# Patient Record
Sex: Female | Born: 1945 | Race: White | Hispanic: No | Smoking: Former smoker
Health system: Southern US, Community
[De-identification: ages and names within clinical notes are randomized; demographics above are authoritative.]

## PROBLEM LIST (undated history)

## (undated) DIAGNOSIS — F419 Anxiety disorder, unspecified: Secondary | ICD-10-CM

## (undated) DIAGNOSIS — R413 Other amnesia: Secondary | ICD-10-CM

## (undated) DIAGNOSIS — F1021 Alcohol dependence, in remission: Secondary | ICD-10-CM

## (undated) DIAGNOSIS — D649 Anemia, unspecified: Secondary | ICD-10-CM

## (undated) DIAGNOSIS — F32A Depression, unspecified: Secondary | ICD-10-CM

## (undated) DIAGNOSIS — Z860100 Personal history of colon polyps, unspecified: Secondary | ICD-10-CM

## (undated) DIAGNOSIS — M8580 Other specified disorders of bone density and structure, unspecified site: Secondary | ICD-10-CM

## (undated) DIAGNOSIS — R350 Frequency of micturition: Secondary | ICD-10-CM

## (undated) DIAGNOSIS — R1310 Dysphagia, unspecified: Secondary | ICD-10-CM

## (undated) DIAGNOSIS — Z5189 Encounter for other specified aftercare: Secondary | ICD-10-CM

## (undated) DIAGNOSIS — K219 Gastro-esophageal reflux disease without esophagitis: Secondary | ICD-10-CM

## (undated) DIAGNOSIS — T8859XA Other complications of anesthesia, initial encounter: Secondary | ICD-10-CM

## (undated) DIAGNOSIS — R0602 Shortness of breath: Secondary | ICD-10-CM

## (undated) DIAGNOSIS — I499 Cardiac arrhythmia, unspecified: Secondary | ICD-10-CM

## (undated) DIAGNOSIS — K589 Irritable bowel syndrome without diarrhea: Secondary | ICD-10-CM

## (undated) DIAGNOSIS — Z923 Personal history of irradiation: Secondary | ICD-10-CM

## (undated) DIAGNOSIS — J189 Pneumonia, unspecified organism: Secondary | ICD-10-CM

## (undated) DIAGNOSIS — Z8669 Personal history of other diseases of the nervous system and sense organs: Secondary | ICD-10-CM

## (undated) DIAGNOSIS — Z8719 Personal history of other diseases of the digestive system: Secondary | ICD-10-CM

## (undated) DIAGNOSIS — F329 Major depressive disorder, single episode, unspecified: Secondary | ICD-10-CM

## (undated) DIAGNOSIS — I10 Essential (primary) hypertension: Secondary | ICD-10-CM

## (undated) DIAGNOSIS — G4700 Insomnia, unspecified: Secondary | ICD-10-CM

## (undated) DIAGNOSIS — M2550 Pain in unspecified joint: Secondary | ICD-10-CM

## (undated) DIAGNOSIS — Z86718 Personal history of other venous thrombosis and embolism: Secondary | ICD-10-CM

## (undated) DIAGNOSIS — G3184 Mild cognitive impairment, so stated: Secondary | ICD-10-CM

## (undated) DIAGNOSIS — M1990 Unspecified osteoarthritis, unspecified site: Secondary | ICD-10-CM

## (undated) DIAGNOSIS — D689 Coagulation defect, unspecified: Secondary | ICD-10-CM

## (undated) DIAGNOSIS — R112 Nausea with vomiting, unspecified: Secondary | ICD-10-CM

## (undated) DIAGNOSIS — F1010 Alcohol abuse, uncomplicated: Secondary | ICD-10-CM

## (undated) DIAGNOSIS — Z8781 Personal history of (healed) traumatic fracture: Secondary | ICD-10-CM

## (undated) DIAGNOSIS — K5790 Diverticulosis of intestine, part unspecified, without perforation or abscess without bleeding: Secondary | ICD-10-CM

## (undated) DIAGNOSIS — R5383 Other fatigue: Secondary | ICD-10-CM

## (undated) DIAGNOSIS — C801 Malignant (primary) neoplasm, unspecified: Secondary | ICD-10-CM

## (undated) DIAGNOSIS — Z9889 Other specified postprocedural states: Secondary | ICD-10-CM

## (undated) DIAGNOSIS — Z862 Personal history of diseases of the blood and blood-forming organs and certain disorders involving the immune mechanism: Secondary | ICD-10-CM

## (undated) DIAGNOSIS — E785 Hyperlipidemia, unspecified: Secondary | ICD-10-CM

## (undated) DIAGNOSIS — K529 Noninfective gastroenteritis and colitis, unspecified: Secondary | ICD-10-CM

## (undated) DIAGNOSIS — H269 Unspecified cataract: Secondary | ICD-10-CM

## (undated) DIAGNOSIS — J309 Allergic rhinitis, unspecified: Secondary | ICD-10-CM

## (undated) DIAGNOSIS — T7840XA Allergy, unspecified, initial encounter: Secondary | ICD-10-CM

## (undated) DIAGNOSIS — G43909 Migraine, unspecified, not intractable, without status migrainosus: Secondary | ICD-10-CM

## (undated) DIAGNOSIS — M75101 Unspecified rotator cuff tear or rupture of right shoulder, not specified as traumatic: Secondary | ICD-10-CM

## (undated) DIAGNOSIS — F1910 Other psychoactive substance abuse, uncomplicated: Secondary | ICD-10-CM

## (undated) DIAGNOSIS — T4145XA Adverse effect of unspecified anesthetic, initial encounter: Secondary | ICD-10-CM

## (undated) HISTORY — DX: Other specified disorders of bone density and structure, unspecified site: M85.80

## (undated) HISTORY — DX: Major depressive disorder, single episode, unspecified: F32.9

## (undated) HISTORY — DX: Gastro-esophageal reflux disease without esophagitis: K21.9

## (undated) HISTORY — PX: UPPER GASTROINTESTINAL ENDOSCOPY: SHX188

## (undated) HISTORY — DX: Coagulation defect, unspecified: D68.9

## (undated) HISTORY — DX: Other amnesia: R41.3

## (undated) HISTORY — DX: Anxiety disorder, unspecified: F41.9

## (undated) HISTORY — PX: CATARACT EXTRACTION W/ INTRAOCULAR LENS  IMPLANT, BILATERAL: SHX1307

## (undated) HISTORY — DX: Essential (primary) hypertension: I10

## (undated) HISTORY — DX: Pain in unspecified joint: M25.50

## (undated) HISTORY — DX: Dysphagia, unspecified: R13.10

## (undated) HISTORY — PX: KNEE ARTHROSCOPY W/ MENISCECTOMY: SHX1879

## (undated) HISTORY — PX: COLONOSCOPY: SHX174

## (undated) HISTORY — DX: Shortness of breath: R06.02

## (undated) HISTORY — DX: Noninfective gastroenteritis and colitis, unspecified: K52.9

## (undated) HISTORY — DX: Unspecified cataract: H26.9

## (undated) HISTORY — DX: Malignant (primary) neoplasm, unspecified: C80.1

## (undated) HISTORY — DX: Alcohol abuse, uncomplicated: F10.10

## (undated) HISTORY — DX: Encounter for other specified aftercare: Z51.89

## (undated) HISTORY — PX: KNEE OPEN LATERAL RELEASE: SHX1897

## (undated) HISTORY — DX: Anemia, unspecified: D64.9

## (undated) HISTORY — DX: Irritable bowel syndrome, unspecified: K58.9

## (undated) HISTORY — PX: UPPER GI ENDOSCOPY: SHX6162

## (undated) HISTORY — DX: Personal history of irradiation: Z92.3

## (undated) HISTORY — PX: OTHER SURGICAL HISTORY: SHX169

## (undated) HISTORY — DX: Other psychoactive substance abuse, uncomplicated: F19.10

## (undated) HISTORY — PX: TONSILLECTOMY AND ADENOIDECTOMY: SUR1326

## (undated) HISTORY — DX: Allergy, unspecified, initial encounter: T78.40XA

## (undated) HISTORY — DX: Depression, unspecified: F32.A

## (undated) HISTORY — PX: TOTAL KNEE ARTHROPLASTY: SHX125

## (undated) HISTORY — DX: Other fatigue: R53.83

## (undated) HISTORY — PX: BREAST LUMPECTOMY: SHX2

## (undated) HISTORY — DX: Diverticulosis of intestine, part unspecified, without perforation or abscess without bleeding: K57.90

---

## 1974-02-16 HISTORY — PX: VAGINAL HYSTERECTOMY: SUR661

## 1994-02-16 DIAGNOSIS — Z862 Personal history of diseases of the blood and blood-forming organs and certain disorders involving the immune mechanism: Secondary | ICD-10-CM

## 1994-02-16 HISTORY — DX: Personal history of diseases of the blood and blood-forming organs and certain disorders involving the immune mechanism: Z86.2

## 1997-08-07 ENCOUNTER — Ambulatory Visit: Admission: RE | Admit: 1997-08-07 | Discharge: 1997-08-07 | Payer: Self-pay | Admitting: Family Medicine

## 1997-10-11 ENCOUNTER — Ambulatory Visit (HOSPITAL_COMMUNITY): Admission: RE | Admit: 1997-10-11 | Discharge: 1997-10-11 | Payer: Self-pay | Admitting: Sports Medicine

## 1997-12-10 ENCOUNTER — Emergency Department (HOSPITAL_COMMUNITY): Admission: EM | Admit: 1997-12-10 | Discharge: 1997-12-10 | Payer: Self-pay | Admitting: Emergency Medicine

## 1999-06-04 ENCOUNTER — Encounter: Payer: Self-pay | Admitting: Family Medicine

## 1999-06-04 ENCOUNTER — Ambulatory Visit (HOSPITAL_COMMUNITY): Admission: RE | Admit: 1999-06-04 | Discharge: 1999-06-04 | Payer: Self-pay | Admitting: Family Medicine

## 2004-02-17 HISTORY — PX: REPLACEMENT TOTAL KNEE: SUR1224

## 2005-06-17 ENCOUNTER — Encounter: Payer: Self-pay | Admitting: Sports Medicine

## 2005-08-04 ENCOUNTER — Ambulatory Visit: Payer: Self-pay | Admitting: Family Medicine

## 2005-08-27 ENCOUNTER — Ambulatory Visit: Payer: Self-pay | Admitting: Family Medicine

## 2005-09-02 ENCOUNTER — Ambulatory Visit: Payer: Self-pay | Admitting: Family Medicine

## 2005-09-08 ENCOUNTER — Encounter: Admission: RE | Admit: 2005-09-08 | Discharge: 2005-09-08 | Payer: Self-pay | Admitting: Family Medicine

## 2005-09-29 ENCOUNTER — Encounter: Admission: RE | Admit: 2005-09-29 | Discharge: 2005-09-29 | Payer: Self-pay | Admitting: Orthopedic Surgery

## 2005-10-02 ENCOUNTER — Emergency Department (HOSPITAL_COMMUNITY): Admission: EM | Admit: 2005-10-02 | Discharge: 2005-10-02 | Payer: Self-pay | Admitting: Family Medicine

## 2005-10-09 ENCOUNTER — Ambulatory Visit: Payer: Self-pay | Admitting: Family Medicine

## 2005-10-14 ENCOUNTER — Ambulatory Visit: Payer: Self-pay | Admitting: Cardiology

## 2005-12-03 ENCOUNTER — Ambulatory Visit: Payer: Self-pay | Admitting: Family Medicine

## 2005-12-07 ENCOUNTER — Ambulatory Visit: Payer: Self-pay | Admitting: Family Medicine

## 2006-01-26 ENCOUNTER — Ambulatory Visit: Payer: Self-pay | Admitting: Family Medicine

## 2006-02-16 LAB — HM COLONOSCOPY

## 2006-06-05 ENCOUNTER — Emergency Department (HOSPITAL_COMMUNITY): Admission: EM | Admit: 2006-06-05 | Discharge: 2006-06-05 | Payer: Self-pay | Admitting: Emergency Medicine

## 2006-08-17 ENCOUNTER — Ambulatory Visit: Payer: Self-pay | Admitting: Family Medicine

## 2006-08-18 ENCOUNTER — Ambulatory Visit: Payer: Self-pay | Admitting: Family Medicine

## 2006-08-18 LAB — CONVERTED CEMR LAB
Albumin: 3.8 g/dL (ref 3.5–5.2)
Alkaline Phosphatase: 73 units/L (ref 39–117)
Basophils Absolute: 0.1 10*3/uL (ref 0.0–0.1)
Basophils Relative: 1.1 % — ABNORMAL HIGH (ref 0.0–1.0)
CO2: 31 meq/L (ref 19–32)
Calcium: 9.2 mg/dL (ref 8.4–10.5)
Chloride: 110 meq/L (ref 96–112)
Eosinophils Relative: 3.2 % (ref 0.0–5.0)
GFR calc Af Amer: 161 mL/min
GFR calc non Af Amer: 133 mL/min
Glucose, Bld: 90 mg/dL (ref 70–99)
HCT: 36.9 % (ref 36.0–46.0)
Hemoglobin: 12.6 g/dL (ref 12.0–15.0)
Lymphocytes Relative: 30.8 % (ref 12.0–46.0)
MCHC: 34 g/dL (ref 30.0–36.0)
Neutro Abs: 2.6 10*3/uL (ref 1.4–7.7)
Potassium: 4 meq/L (ref 3.5–5.1)
RDW: 12.2 % (ref 11.5–14.6)
Sodium: 144 meq/L (ref 135–145)
Total Protein: 6.8 g/dL (ref 6.0–8.3)

## 2006-08-24 ENCOUNTER — Ambulatory Visit: Payer: Self-pay | Admitting: Family Medicine

## 2006-09-22 ENCOUNTER — Encounter: Payer: Self-pay | Admitting: *Deleted

## 2006-09-29 ENCOUNTER — Encounter: Admission: RE | Admit: 2006-09-29 | Discharge: 2006-09-29 | Payer: Self-pay | Admitting: Family Medicine

## 2006-10-07 ENCOUNTER — Ambulatory Visit: Payer: Self-pay | Admitting: Internal Medicine

## 2006-10-19 ENCOUNTER — Ambulatory Visit: Payer: Self-pay | Admitting: Internal Medicine

## 2006-10-19 ENCOUNTER — Encounter: Payer: Self-pay | Admitting: Family Medicine

## 2006-11-11 DIAGNOSIS — G43909 Migraine, unspecified, not intractable, without status migrainosus: Secondary | ICD-10-CM

## 2006-11-11 DIAGNOSIS — E785 Hyperlipidemia, unspecified: Secondary | ICD-10-CM | POA: Insufficient documentation

## 2006-11-11 DIAGNOSIS — M1990 Unspecified osteoarthritis, unspecified site: Secondary | ICD-10-CM | POA: Insufficient documentation

## 2006-11-17 ENCOUNTER — Ambulatory Visit: Payer: Self-pay | Admitting: Internal Medicine

## 2006-11-17 DIAGNOSIS — J040 Acute laryngitis: Secondary | ICD-10-CM | POA: Insufficient documentation

## 2006-12-10 ENCOUNTER — Telehealth: Payer: Self-pay | Admitting: Family Medicine

## 2007-01-04 ENCOUNTER — Telehealth: Payer: Self-pay | Admitting: Family Medicine

## 2007-01-05 ENCOUNTER — Telehealth: Payer: Self-pay | Admitting: Family Medicine

## 2007-02-11 ENCOUNTER — Ambulatory Visit: Payer: Self-pay | Admitting: Internal Medicine

## 2007-03-18 ENCOUNTER — Telehealth: Payer: Self-pay | Admitting: Family Medicine

## 2007-03-22 ENCOUNTER — Ambulatory Visit: Payer: Self-pay | Admitting: Family Medicine

## 2007-05-30 ENCOUNTER — Ambulatory Visit: Payer: Self-pay | Admitting: Family Medicine

## 2007-07-05 ENCOUNTER — Ambulatory Visit (HOSPITAL_COMMUNITY): Admission: RE | Admit: 2007-07-05 | Discharge: 2007-07-05 | Payer: Self-pay | Admitting: Orthopedic Surgery

## 2007-07-13 ENCOUNTER — Ambulatory Visit (HOSPITAL_COMMUNITY): Admission: RE | Admit: 2007-07-13 | Discharge: 2007-07-14 | Payer: Self-pay | Admitting: Orthopedic Surgery

## 2007-08-11 ENCOUNTER — Telehealth: Payer: Self-pay | Admitting: *Deleted

## 2007-08-23 ENCOUNTER — Ambulatory Visit: Payer: Self-pay | Admitting: Family Medicine

## 2007-08-23 LAB — CONVERTED CEMR LAB
AST: 18 units/L (ref 0–37)
Alkaline Phosphatase: 72 units/L (ref 39–117)
BUN: 3 mg/dL — ABNORMAL LOW (ref 6–23)
Basophils Absolute: 0 10*3/uL (ref 0.0–0.1)
Basophils Relative: 0.7 % (ref 0.0–1.0)
Bilirubin, Direct: 0.1 mg/dL (ref 0.0–0.3)
CO2: 27 meq/L (ref 19–32)
Eosinophils Absolute: 0.2 10*3/uL (ref 0.0–0.7)
Eosinophils Relative: 3.5 % (ref 0.0–5.0)
GFR calc Af Amer: 161 mL/min
Glucose, Bld: 93 mg/dL (ref 70–99)
Glucose, Urine, Semiquant: NEGATIVE
Lymphocytes Relative: 23.5 % (ref 12.0–46.0)
Monocytes Relative: 7.2 % (ref 3.0–12.0)
Neutro Abs: 3.5 10*3/uL (ref 1.4–7.7)
RDW: 12 % (ref 11.5–14.6)
Specific Gravity, Urine: 1.02
TSH: 0.86 microintl units/mL (ref 0.35–5.50)
Total Bilirubin: 0.7 mg/dL (ref 0.3–1.2)
Total Protein: 7.1 g/dL (ref 6.0–8.3)
WBC Urine, dipstick: NEGATIVE
WBC: 5.4 10*3/uL (ref 4.5–10.5)

## 2007-08-30 ENCOUNTER — Ambulatory Visit: Payer: Self-pay | Admitting: Family Medicine

## 2007-08-30 DIAGNOSIS — J309 Allergic rhinitis, unspecified: Secondary | ICD-10-CM

## 2007-09-08 ENCOUNTER — Encounter: Admission: RE | Admit: 2007-09-08 | Discharge: 2007-10-18 | Payer: Self-pay | Admitting: Orthopedic Surgery

## 2007-11-11 ENCOUNTER — Telehealth: Payer: Self-pay | Admitting: *Deleted

## 2007-11-18 ENCOUNTER — Encounter: Admission: RE | Admit: 2007-11-18 | Discharge: 2007-11-18 | Payer: Self-pay | Admitting: Family Medicine

## 2007-12-22 ENCOUNTER — Telehealth: Payer: Self-pay | Admitting: Family Medicine

## 2008-02-20 ENCOUNTER — Telehealth: Payer: Self-pay | Admitting: Family Medicine

## 2008-02-21 ENCOUNTER — Telehealth: Payer: Self-pay | Admitting: *Deleted

## 2008-02-23 ENCOUNTER — Telehealth: Payer: Self-pay | Admitting: Family Medicine

## 2008-03-06 ENCOUNTER — Telehealth: Payer: Self-pay | Admitting: Family Medicine

## 2008-03-30 ENCOUNTER — Telehealth: Payer: Self-pay | Admitting: Family Medicine

## 2008-05-07 ENCOUNTER — Telehealth: Payer: Self-pay | Admitting: Family Medicine

## 2008-06-22 DIAGNOSIS — G5600 Carpal tunnel syndrome, unspecified upper limb: Secondary | ICD-10-CM

## 2008-06-23 ENCOUNTER — Ambulatory Visit: Payer: Self-pay | Admitting: Internal Medicine

## 2008-06-23 LAB — CONVERTED CEMR LAB: Rapid Strep: NEGATIVE

## 2008-06-25 ENCOUNTER — Telehealth: Payer: Self-pay | Admitting: Family Medicine

## 2008-06-28 ENCOUNTER — Telehealth: Payer: Self-pay | Admitting: Family Medicine

## 2008-06-29 ENCOUNTER — Ambulatory Visit: Payer: Self-pay | Admitting: Family Medicine

## 2008-06-29 LAB — CONVERTED CEMR LAB
ALT: 25 units/L (ref 0–35)
BUN: 12 mg/dL (ref 6–23)
Basophils Absolute: 0 10*3/uL (ref 0.0–0.1)
Basophils Relative: 1.2 % (ref 0.0–3.0)
Eosinophils Absolute: 0.1 10*3/uL (ref 0.0–0.7)
GFR calc non Af Amer: 107.04 mL/min (ref >60)
HCT: 35.7 % — ABNORMAL LOW (ref 36.0–46.0)
Hemoglobin: 12.4 g/dL (ref 12.0–15.0)
Lymphocytes Relative: 31.6 % (ref 12.0–46.0)
MCHC: 34.7 g/dL (ref 30.0–36.0)
Monocytes Relative: 10.6 % (ref 3.0–12.0)
Neutrophils Relative %: 53.6 % (ref 43.0–77.0)
Platelets: 280 10*3/uL (ref 150.0–400.0)
Potassium: 4 meq/L (ref 3.5–5.1)
Total Bilirubin: 0.8 mg/dL (ref 0.3–1.2)
Total Protein: 6.8 g/dL (ref 6.0–8.3)

## 2008-08-08 ENCOUNTER — Telehealth: Payer: Self-pay | Admitting: *Deleted

## 2008-08-13 ENCOUNTER — Emergency Department (HOSPITAL_COMMUNITY): Admission: EM | Admit: 2008-08-13 | Discharge: 2008-08-13 | Payer: Self-pay | Admitting: Family Medicine

## 2008-08-31 ENCOUNTER — Ambulatory Visit: Payer: Self-pay | Admitting: Family Medicine

## 2008-08-31 LAB — CONVERTED CEMR LAB
ALT: 32 units/L (ref 0–35)
BUN: 12 mg/dL (ref 6–23)
Basophils Absolute: 0 10*3/uL (ref 0.0–0.1)
Chloride: 107 meq/L (ref 96–112)
Cholesterol: 167 mg/dL (ref 0–200)
Creatinine, Ser: 0.5 mg/dL (ref 0.4–1.2)
Glucose, Bld: 88 mg/dL (ref 70–99)
HDL: 55 mg/dL (ref >39.00)
Ketones, urine, test strip: NEGATIVE
Lymphs Abs: 1.2 10*3/uL (ref 0.7–4.0)
MCHC: 34.8 g/dL (ref 30.0–36.0)
Monocytes Relative: 8.7 % (ref 3.0–12.0)
Neutro Abs: 1.7 10*3/uL (ref 1.4–7.7)
Neutrophils Relative %: 50.3 % (ref 43.0–77.0)
Nitrite: NEGATIVE
RBC: 3.77 M/uL — ABNORMAL LOW (ref 3.87–5.11)
RDW: 12.1 % (ref 11.5–14.6)
Sodium: 142 meq/L (ref 135–145)
Specific Gravity, Urine: 1.015
TSH: 1.34 microintl units/mL (ref 0.35–5.50)
Total CHOL/HDL Ratio: 3
Total Protein: 7.1 g/dL (ref 6.0–8.3)

## 2008-09-17 ENCOUNTER — Ambulatory Visit: Payer: Self-pay | Admitting: Family Medicine

## 2008-10-16 ENCOUNTER — Ambulatory Visit: Payer: Self-pay | Admitting: Family Medicine

## 2008-10-16 ENCOUNTER — Encounter: Payer: Self-pay | Admitting: Family Medicine

## 2008-10-17 DIAGNOSIS — L820 Inflamed seborrheic keratosis: Secondary | ICD-10-CM

## 2008-10-29 ENCOUNTER — Ambulatory Visit: Payer: Self-pay | Admitting: Family Medicine

## 2008-10-29 LAB — CONVERTED CEMR LAB
Bilirubin Urine: NEGATIVE
Glucose, Urine, Semiquant: NEGATIVE
Nitrite: NEGATIVE
Protein, U semiquant: NEGATIVE

## 2008-11-28 ENCOUNTER — Ambulatory Visit (HOSPITAL_BASED_OUTPATIENT_CLINIC_OR_DEPARTMENT_OTHER): Admission: RE | Admit: 2008-11-28 | Discharge: 2008-11-28 | Payer: Self-pay | Admitting: Orthopedic Surgery

## 2009-01-08 ENCOUNTER — Encounter: Admission: RE | Admit: 2009-01-08 | Discharge: 2009-01-08 | Payer: Self-pay | Admitting: Family Medicine

## 2009-01-17 ENCOUNTER — Encounter: Admission: RE | Admit: 2009-01-17 | Discharge: 2009-02-14 | Payer: Self-pay | Admitting: Orthopedic Surgery

## 2009-01-28 ENCOUNTER — Encounter: Admission: RE | Admit: 2009-01-28 | Discharge: 2009-01-30 | Payer: Self-pay | Admitting: Orthopedic Surgery

## 2009-02-05 ENCOUNTER — Telehealth: Payer: Self-pay | Admitting: Family Medicine

## 2009-02-07 ENCOUNTER — Telehealth: Payer: Self-pay | Admitting: Family Medicine

## 2009-02-16 HISTORY — PX: OTHER SURGICAL HISTORY: SHX169

## 2009-02-18 ENCOUNTER — Encounter: Admission: RE | Admit: 2009-02-18 | Discharge: 2009-05-19 | Payer: Self-pay | Admitting: Orthopedic Surgery

## 2009-03-28 ENCOUNTER — Telehealth: Payer: Self-pay | Admitting: Family Medicine

## 2009-04-03 ENCOUNTER — Telehealth: Payer: Self-pay | Admitting: Family Medicine

## 2009-06-03 ENCOUNTER — Telehealth: Payer: Self-pay | Admitting: Family Medicine

## 2009-06-12 ENCOUNTER — Telehealth: Payer: Self-pay | Admitting: Family Medicine

## 2009-08-02 ENCOUNTER — Telehealth: Payer: Self-pay | Admitting: Family Medicine

## 2009-10-08 ENCOUNTER — Ambulatory Visit: Payer: Self-pay | Admitting: Family Medicine

## 2009-10-08 LAB — CONVERTED CEMR LAB
Albumin: 4.4 g/dL (ref 3.5–5.2)
Bilirubin, Direct: 0.1 mg/dL (ref 0.0–0.3)
CO2: 29 meq/L (ref 19–32)
Chloride: 105 meq/L (ref 96–112)
Cholesterol: 268 mg/dL — ABNORMAL HIGH (ref 0–200)
Direct LDL: 187.9 mg/dL
Eosinophils Absolute: 0.1 10*3/uL (ref 0.0–0.7)
Glucose, Bld: 94 mg/dL (ref 70–99)
Hemoglobin: 13.8 g/dL (ref 12.0–15.0)
MCHC: 33.7 g/dL (ref 30.0–36.0)
MCV: 99.7 fL (ref 78.0–100.0)
Monocytes Absolute: 0.3 10*3/uL (ref 0.1–1.0)
Platelets: 286 10*3/uL (ref 150.0–400.0)
Potassium: 4 meq/L (ref 3.5–5.1)
RDW: 12.6 % (ref 11.5–14.6)
TSH: 0.99 microintl units/mL (ref 0.35–5.50)
Total CHOL/HDL Ratio: 4
Triglycerides: 85 mg/dL (ref 0.0–149.0)

## 2009-10-15 ENCOUNTER — Ambulatory Visit: Payer: Self-pay | Admitting: Family Medicine

## 2009-10-15 DIAGNOSIS — I10 Essential (primary) hypertension: Secondary | ICD-10-CM

## 2009-10-29 ENCOUNTER — Ambulatory Visit: Payer: Self-pay | Admitting: Family Medicine

## 2009-11-08 ENCOUNTER — Telehealth: Payer: Self-pay | Admitting: Family Medicine

## 2009-11-20 ENCOUNTER — Ambulatory Visit: Payer: Self-pay | Admitting: Family Medicine

## 2010-01-24 ENCOUNTER — Telehealth: Payer: Self-pay | Admitting: Family Medicine

## 2010-03-17 ENCOUNTER — Ambulatory Visit
Admission: RE | Admit: 2010-03-17 | Discharge: 2010-03-17 | Payer: Self-pay | Source: Home / Self Care | Attending: Family Medicine | Admitting: Family Medicine

## 2010-03-18 NOTE — Progress Notes (Signed)
Summary: new rx  Phone Note Call from Patient Call back at Home Phone [phone]   Caller: Patient Call For: Tonya Pee MD Summary of Call: pt needs new rx ritalin 10 mg call when ready for pick up. Initial call taken by: Heron Sabins,  June 03, 2009 8:41 AM  Follow-up for Phone Call        rx ready to pick up Follow-up by: Kern Reap CMA Duncan Dull),  June 03, 2009 9:57 AM    Prescriptions: RITALIN 10 MG  TABS (METHYLPHENIDATE HCL) 2AM & 2 AFTERNOON fill in one month  #120 x 0   Entered by:   Kern Reap CMA (AAMA)   Authorized by:   Tonya Pee MD   Signed by:   Kern Reap CMA (AAMA) on 06/03/2009   Method used:   Print then Give to Patient   RxID:   [id number] RITALIN 10 MG  TABS (METHYLPHENIDATE HCL) 2AM & 2 AFTERNOON fill in two months  #120 x 0   Entered by:   Kern Reap CMA (AAMA)   Authorized by:   Tonya Pee MD   Signed by:   Kern Reap CMA (AAMA) on 06/03/2009   Method used:   Print then Give to Patient   RxID:   [id number] RITALIN 10 MG  TABS (METHYLPHENIDATE HCL) 2AM AND 2 AFTERNOON  #120 x 0   Entered by:   Kern Reap CMA (AAMA)   Authorized by:   Tonya Pee MD   Signed by:   Kern Reap CMA (AAMA) on 06/03/2009   Method used:   Print then Give to Patient   RxID:   [id number]

## 2010-03-18 NOTE — Assessment & Plan Note (Signed)
Summary: cpx/cjr   Vital Signs:  Patient profile:   65 year old female Menstrual status:  hysterectomy Height:      66 inches Weight:      204 pounds BMI:     33.05 Temp:     98.1 degrees F oral BP sitting:   172 / 100  (left arm) Cuff size:   large  Vitals Entered By: Kern Reap CMA Duncan Dull) (October 15, 2009 10:50 AM) Flu Vaccine Consent Questions     Do you have a history of severe allergic reactions to this vaccine? no    Any prior history of allergic reactions to egg and/or gelatin? no    Do you have a sensitivity to the preservative Thimersol? no    Do you have a past history of Guillan-Barre Syndrome? no    Do you currently have an acute febrile illness? no    Have you ever had a severe reaction to latex? no    Vaccine information given and explained to patient? yes    Are you currently pregnant? 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  She has a history of underlying ADD for which he takes Ritalin 20 mg in the morning and 20 mg in the afternoon.  She also takes Vicodin ES, one tablet b.i.d. for chronic back and neck pain.  Now her right shoulder and hands are bothering her a lot.  Recommend she go back and see her orthopedist.  She takes an 81-mg baby aspirin daily and uses MetroGel and a steroid lotion for rosacea.  She stopped her Lexapro, feels emotionally well.  She also takes Imitrex p.r.n. for migraine headaches.  Tetanus fissure 2008 seasonal flu shot and Pneumovax today.  Here for Medicare AWV:  1.   Risk factors based on Past M, S, F history:..reviewed in detail.  No change, except she's now self-employed. 2.   Physical Activities: ..does not exercise on a regular basis 3.   Depression/mood: mood good.  No  depression 4.   Hearing: hearing normal 5.   ADL's: works 35 hours per week.  Functions normally 6.   Fall Risk:  at home 7.   Home Safety: reviewed.  No guns in the house 8.   Height, weight, &visual acuity:height weight, normal.  Visual acuity normal 9.   Counseling: start an exercise program and weight loss program 10.   Labs ordered based on risk factors: hyperlipidemia on lab work 11.           Referral Coordinationnone indicated 12.           Care Plan..start Zestoretic 20 to 25 daily for hypertension.  Daily blood pressure checks at home.  Follow-up in two weeks.Marland Kitchenalso Zocor 20 mg nightly for hyperlipidemia 13.            Cognitive Assessment .Marland Kitchen...normal.......Marland Kitchen functions normally writes her own checks  Allergies: 1)  ! Penicillin G Sodium (Penicillin G Sodium) 2)  ! * Nickel 3)  ! Nsaids  Past History:  Past medical, surgical, family and social histories (including risk factors) reviewed, and no changes noted (except as noted below).  Past Medical History: Reviewed history from 08/30/2007 and no changes required. Osteoarthritis migraine learning disability Hyperlipidemia psoratic arthritis  bilateral total knee replacements Allergic rhinitis hysterectomy ovaries left intact number x 3 tonsillectomy  Past Surgical History: Reviewed history from 11/11/2006 and no changes required. Hysterectomy  Family History: Reviewed history from 08/30/2007 and no changes required. father died at 3, coronary disease, hypertension, smoker mother died 50.  No known coronary disease, coronary bypass obesity  one brother, skin cancertwo sisters have arthritis  Social History: Reviewed history from 06/23/2008 and no changes required. Occupation: Widow/Widower Former Smoker Alcohol use-no Drug use-no brother dies this week and just had funeral  Review of Systems      See HPI  Physical Exam  General:  Well-developed,well-nourished,in no acute distress; alert,appropriate and  cooperative throughout examination Head:  Normocephalic and atraumatic without obvious abnormalities. No apparent alopecia or balding. Eyes:  No corneal or conjunctival inflammation noted. EOMI. Perrla. Funduscopic exam benign, without hemorrhages, exudates or papilledema. Vision grossly normal. Ears:  External ear exam shows no significant lesions or deformities.  Otoscopic examination reveals clear canals, tympanic membranes are intact bilaterally without bulging, retraction, inflammation or discharge. Hearing is grossly normal bilaterally. Nose:  External nasal examination shows no deformity or inflammation. Nasal mucosa are pink and moist without lesions or exudates. Mouth:  Oral mucosa and oropharynx without lesions or exudates.  Teeth in good repair. Neck:  No deformities, masses, or tenderness noted. Chest Wall:  No deformities, masses, or tenderness noted. Breasts:  No mass, nodules, thickening, tenderness, bulging, retraction, inflamation, nipple discharge or skin changes noted.   Lungs:  Normal respiratory effort, chest expands symmetrically. Lungs are clear to auscultation, no crackles or wheezes. Heart:  Normal rate and regular rhythm. S1 and S2 normal without gallop, murmur, click, rub or other extra sounds. Abdomen:  Bowel sounds positive,abdomen soft and non-tender without masses, organomegaly or hernias noted. Rectal:  No external abnormalities noted. Normal sphincter tone. No rectal masses or tenderness. Genitalia:  Pelvic Exam:        External: normal female genitalia without lesions or masses        Vagina: normal without lesions or masses        Cervix: normal without lesions or masses        Adnexa: normal bimanual exam without masses or fullness        Uterus: normal by palpation        Pap smear: not performed Msk:  No deformity or scoliosis noted of thoracic or lumbar spine.   Pulses:  R and L carotid,radial,femoral,dorsalis pedis and posterior tibial pulses are full and  equal bilaterally Extremities:  No clubbing, cyanosis, edema, or deformity noted with normal full range of motion of all joints.   Neurologic:  No cranial nerve deficits noted. Station and gait are normal. Plantar reflexes are down-going bilaterally. DTRs are symmetrical throughout. Sensory, motor and coordinative functions appear intact. Skin:  total body skin exam normal except for scars, right left knee from previous knee replacement.  Also, right shoulder from previous over surgery Cervical Nodes:  No lymphadenopathy noted Axillary Nodes:  No palpable lymphadenopathy Inguinal Nodes:  No significant adenopathy Psych:  Cognition and judgment appear intact. Alert and cooperative with normal attention span and concentration. No apparent delusions, illusions, hallucinations   Problems:  Medical Problems Added: 1)  Dx of Hypertension  (ICD-401.9)  Impression & Recommendations:  Problem # 1:  PHYSICAL EXAMINATION (ICD-V70.0) Assessment Unchanged  Orders: Medicare -1st Annual Wellness Visit [phone])  Problem # 2:  MIGRAINE HEADACHE (ICD-346.90) Assessment: Unchanged  Her updated medication list for this problem  includes:    Baby Aspirin 81 Mg Chew (Aspirin) .Marland Kitchen... 2 once daily    Vicodin Es 7.5-750 Mg Tabs (Hydrocodone-acetaminophen) .Marland Kitchen... Take 1 tablet by mouth two times a day prn    Sumatriptan Succinate 100 Mg Tabs (Sumatriptan succinate) .Marland Kitchen... Take one tablet once daily prn  Orders: Medicare -1st Annual Wellness Visit [phone])  Problem # 3:  HYPERLIPIDEMIA (ICD-272.4) Assessment: Deteriorated  The following medications were removed from the medication list:    Zocor 80 Mg Tabs (Simvastatin) .Marland Kitchen... Take 1 tablet by mouth at bedtime Her updated medication list for this problem includes:    Zocor 20 Mg Tabs (Simvastatin) .Marland Kitchen... 1 tab @ bedtime  Problem # 4:  OSTEOARTHRITIS (ICD-715.90) Assessment: Unchanged  Her updated medication list for this problem includes:    Baby Aspirin  81 Mg Chew (Aspirin) .Marland Kitchen... 2 once daily    Vicodin Es 7.5-750 Mg Tabs (Hydrocodone-acetaminophen) .Marland Kitchen... Take 1 tablet by mouth two times a day prn  Orders: Medicare -1st Annual Wellness Visit [phone])  Problem # 5:  HYPERTENSION (ICD-401.9) Assessment: New  Her updated medication list for this problem includes:    Zestoretic 10-12.5 Mg Tabs (Lisinopril-hydrochlorothiazide) .Marland Kitchen... Take 1 tablet by mouth every morning  Orders: EKG w/ Interpretation (93000)  Complete Medication List: 1)  Ritalin 10 Mg Tabs (Methylphenidate hcl) .... 2am and 2 afternoon 2)  Baby Aspirin 81 Mg Chew (Aspirin) .... 2 once daily 3)  Betamethasone Dipropionate 0.05 % Crea (Betamethasone dipropionate) .... As needed 4)  Ritalin 10 Mg Tabs (Methylphenidate hcl) .... 2am & 2 afternoon fill in one month 5)  Ritalin 10 Mg Tabs (Methylphenidate hcl) .... 2am & 2 afternoon fill in two months 6)  Vicodin Es 7.5-750 Mg Tabs (Hydrocodone-acetaminophen) .... Take 1 tablet by mouth two times a day prn 7)  Metrogel 1 % Gel (Metronidazole) .... Apply at bedtime 8)  Del-beta 0.05 % Lotn (Betamethasone dipropionate) .... Apply daily 9)  Sumatriptan Succinate 100 Mg Tabs (Sumatriptan succinate) .... Take one tablet once daily prn 10)  Zestoretic 10-12.5 Mg Tabs (Lisinopril-hydrochlorothiazide) .... Take 1 tablet by mouth every morning 11)  Zocor 20 Mg Tabs (Simvastatin) .Marland Kitchen.. 1 tab @ bedtime  Other Orders: Admin 1st Vaccine (09811) Flu Vaccine 53yrs + (91478) Pneumococcal Vaccine (29562) Admin of Any Addtl Vaccine (13086)  Patient Instructions: 1)  begin Zestoretic one tablet daily in the morning.  Check her blood pressure daily in the morning.  Return in two weeks for follow-up with the data and the device. 2)  Begin Zocor 20 mg nightly for hyperlipidemia. 3)  Continue your other medications 4)  Please schedule a follow-up appointment in 1 year. 5)  Please schedule a follow-up appointment as needed. 6)  It is important  that you exercise regularly at least 20 minutes 5 times a week. If you develop chest pain, have severe difficulty breathing, or feel very tired , stop exercising immediately and seek medical attention. 7)  You need to lose weight. Consider a lower calorie diet and regular exercise.  8)  Schedule your mammogram. 9)  Schedule a colonoscopy/sigmoidoscopy to help detect colon cancer. 10)  Take calcium +Vitamin D daily. 11)  Take an Aspirin every day. Prescriptions: SUMATRIPTAN SUCCINATE 100 MG TABS (SUMATRIPTAN SUCCINATE) take one tablet once daily prn  #9 x 11   Entered and Authorized by:   Roderick Pee MD   Signed by:   Roderick Pee MD on 10/15/2009   Method used:   Printed then faxed to .Marland KitchenMarland Kitchen  [facility]* (retail)       [address]       [place], [place]  [address]       Ph: [phone] or [phone]       Fax: [phone]   RxID:   [id number] DEL-BETA 0.05 %  LOTN (BETAMETHASONE DIPROPIONATE) apply daily  #60 ml x 3 x 4   Entered and Authorized by:   Roderick Pee MD   Signed by:   Roderick Pee MD on 10/15/2009   Method used:   Printed then faxed to ...       [facility]* (retail)       [address]       [place], [place]  [address]       Ph: [phone] or [phone]       Fax: [phone]   RxID:   [id number] METROGEL 1 %  GEL (METRONIDAZOLE) apply at bedtime  #30 gr x 8   Entered and Authorized by:   Roderick Pee MD   Signed by:   Roderick Pee MD on 10/15/2009   Method used:   Printed then faxed to ...       [facility] (retail)       [address]       [place], [place]  [address]       Ph: [phone] or [phone]       Fax: [phone]   RxID:   [id number] VICODIN ES 7.5-750 MG  TABS (HYDROCODONE-ACETAMINOPHEN) Take 1 tablet by mouth two times a day prn  #60 x 11   Entered and Authorized by:   Roderick Pee MD   Signed by:   Roderick Pee MD on 10/15/2009   Method used:   Printed then faxed to ...       [facility] (retail)       [address]       [place], [place]  [address]       Ph: [phone] or [phone]       Fax: [phone]   RxID:   [id number] RITALIN 10 MG  TABS (METHYLPHENIDATE HCL) 2AM & 2 AFTERNOON fill in two months  #120 x 0   Entered and Authorized by:   Roderick Pee MD   Signed by:   Roderick Pee MD on 10/15/2009   Method used:   Printed then faxed to ...       [facility] (retail)       [address]       [place], [place]  [address]       Ph: [phone] or [phone]       Fax: [phone]   RxID:   [id number] RITALIN 10 MG  TABS (METHYLPHENIDATE HCL) 2AM & 2 AFTERNOON fill in one month  #120 x 0   Entered and Authorized by:   Roderick Pee MD   Signed by:   Roderick Pee MD on 10/15/2009   Method used:   Printed then faxed to ...       [facility] (retail)       [address]       [place], [place]  [address]       Ph: [phone] or [phone]       Fax: [phone]   RxID:   [id number] RITALIN 10 MG  TABS (METHYLPHENIDATE HCL) 2AM AND 2 AFTERNOON  #120 x 0  Entered and Authorized by:   Roderick Pee MD   Signed by:   Roderick Pee MD on 10/15/2009   Method used:   Printed then faxed to ...       [facility] (retail)       [address]       [place], [place]  [address]       Ph: [phone] or [phone]       Fax: [phone]   RxID:   [id number] BETAMETHASONE DIPROPIONATE 0.05 %  CREA (BETAMETHASONE DIPROPIONATE) as needed  #60 gr. x 3   Entered and Authorized by:   Roderick Pee MD   Signed by:   Roderick Pee MD on 10/15/2009   Method used:   Printed then faxed to ...       [facility] (retail)       [address]       [place], [place]  [address]       Ph: [phone] or [phone]       Fax: [phone]   RxID:   [id number] ZOCOR 20 MG TABS (SIMVASTATIN) 1 tab @ bedtime  #100 x 3   Entered and Authorized by:   Roderick Pee MD   Signed by:   Roderick Pee MD on 10/15/2009   Method  used:   Printed then faxed to ...       [facility] (retail)       [address]       [place], [place]  [address]       Ph: [phone] or [phone]       Fax: [phone]   RxID:   [id number] ZESTORETIC 10-12.5 MG TABS (LISINOPRIL-HYDROCHLOROTHIAZIDE) Take 1 tablet by mouth every morning  #100 x 3   Entered and Authorized by:   Roderick Pee MD   Signed by:   Roderick Pee MD on 10/15/2009   Method used:   Printed then faxed to ...       [facility] (retail)       [address]       [place], [place]  [address]       Ph: [phone] or [phone]       Fax: [phone]   RxID:   [id number]    Immunizations Administered:  Pneumonia Vaccine:    Vaccine Type: Pneumovax    Site: right deltoid    Mfr: Merck    Dose: 0.5 ml    Route: IM    Given by: Kathrynn Speed CMA    Exp. Date: 03/01/2011    Lot #: [id number]    VIS given: 09/14/95 version given October 15, 2009.

## 2010-03-18 NOTE — Progress Notes (Signed)
Summary: vicodin refill  Phone Note From Pharmacy   Summary of Call: patient would like a refill of vicodin is this okay to fill? Initial call taken by: Kern Reap CMA Duncan Dull),  June 12, 2009 11:17 AM  Follow-up for Phone Call        Vicodin ES, dispense 60 tablets, directions one p.o. b.i.d. p.r.n. for severe pain, refills x 3 Follow-up by: Roderick Pee MD,  June 12, 2009 11:23 AM    Prescriptions: VICODIN ES 7.5-750 MG  TABS (HYDROCODONE-ACETAMINOPHEN) Take 1 tablet by mouth two times a day prn  #60 x 3   Entered by:   Kern Reap CMA (AAMA)   Authorized by:   Roderick Pee MD   Signed by:   Kern Reap CMA (AAMA) on 06/12/2009   Method used:   Historical   RxID:   [id number]

## 2010-03-18 NOTE — Assessment & Plan Note (Signed)
Summary: 3 WEEK FOLLOW WITH THE DATA AND THE DEVICE/CJR   Vital Signs:  Patient profile:   65 year old female Menstrual status:  hysterectomy Weight:      204 pounds Temp:     98.6 degrees F oral BP sitting:   130 / 84  (left arm) Cuff size:   regular  Vitals Entered By: Kern Reap CMA Duncan Dull) (November 20, 2009 10:18 AM) CC: follow-up visit   CC:  follow-up visit.  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  She is on Zestoretic 10 to 12.5 daily BP now within normal limits.  She went to get her Lexapro filled, but is very expensive.  She would like something cheaper.  We will go with the Celexa.  She thinks the Zocor is giving her a lot of gas.  Advised to take it Monday, Wednesday, Friday  Allergies: 1)  ! Penicillin G Sodium (Penicillin G Sodium) 2)  ! * Nickel 3)  ! Nsaids  Past History:  Past medical, surgical, family and social histories (including risk factors) reviewed for relevance to current acute and chronic problems.  Past Medical History: Reviewed history from 08/30/2007 and no changes required. Osteoarthritis migraine learning disability Hyperlipidemia psoratic arthritis bilateral total knee replacements Allergic rhinitis hysterectomy ovaries left intact number x 3 tonsillectomy  Past Surgical History: Reviewed history from 11/11/2006 and no changes required. Hysterectomy  Family History: Reviewed history from 08/30/2007 and no changes required. father died at 58, coronary disease, hypertension, smoker mother died 38.  No known coronary disease, coronary bypass obesity  one brother, skin cancertwo sisters have arthritis  Social History: Reviewed history from 06/23/2008 and no changes required. Occupation: Widow/Widower Former Smoker Alcohol use-no Drug use-no brother dies this week and just had funeral  Review of Systems      See  HPI  Physical Exam  General:  Well-developed,well-nourished,in no acute distress; alert,appropriate and cooperative throughout examination Heart:  136/80 right arm sitting position   Impression & Recommendations:  Problem # 1:  HYPERTENSION (ICD-401.9) Assessment Improved  Her updated medication list for this problem includes:    Zestoretic 10-12.5 Mg Tabs (Lisinopril-hydrochlorothiazide) .Marland Kitchen... Take 1 tablet by mouth every morning  Complete Medication List: 1)  Ritalin 10 Mg Tabs (Methylphenidate hcl) .... 2am and 2 afternoon 2)  Baby Aspirin 81 Mg Chew (Aspirin) .... 2 once daily 3)  Betamethasone Dipropionate 0.05 % Crea (Betamethasone dipropionate) .... As needed 4)  Ritalin 10 Mg Tabs (Methylphenidate hcl) .... 2am & 2 afternoon fill in one month 5)  Ritalin 10 Mg Tabs (Methylphenidate hcl) .... 2am & 2 afternoon fill in two months 6)  Vicodin Es 7.5-750 Mg Tabs (Hydrocodone-acetaminophen) .... Take 1 tablet by mouth two times a day prn 7)  Metrogel 1 % Gel (Metronidazole) .... Apply at bedtime 8)  Del-beta 0.05 % Lotn (Betamethasone dipropionate) .... Apply daily 9)  Sumatriptan Succinate 100 Mg Tabs (Sumatriptan succinate) .... Take one tablet once daily prn 10)  Zestoretic 10-12.5 Mg Tabs (Lisinopril-hydrochlorothiazide) .... Take 1 tablet by mouth every morning 11)  Zocor 20 Mg Tabs (Simvastatin) .Marland Kitchen.. 1 tab @ bedtime 12)  Celexa 20 Mg Tabs (Citalopram hydrobromide) .... Marland Kitchen1 tab @ bedtime  Patient Instructions: 1)  continue the Zestoretic, one tablet daily, decrease the Zocor to Monday, Wednesday, Friday, Celexa 20 mg nightly, return p.r.n. Prescriptions: CELEXA 20 MG TABS (CITALOPRAM HYDROBROMIDE) .1 tab @ bedtime  #100 x  3   Entered and Authorized by:   Roderick Pee MD   Signed by:   Roderick Pee MD on 11/20/2009   Method used:   Print then Give to Patient   RxID:   [id number]

## 2010-03-18 NOTE — Progress Notes (Signed)
Summary: new rx  Phone Note Refill Request Call back at Home Phone [phone] Message from:  Patient  Refills Requested: Medication #1:  RITALIN 10 MG  TABS 2AM AND 2 AFTERNOON new rx  Initial call taken by: Heron Sabins,  January 24, 2010 8:52 AM    Prescriptions: RITALIN 10 MG  TABS (METHYLPHENIDATE HCL) 2AM & 2 AFTERNOON fill in two months  #120 x 0   Entered by:   Kern Reap CMA (AAMA)   Authorized by:   Roderick Pee MD   Signed by:   Kern Reap CMA (AAMA) on 01/24/2010   Method used:   Print then Give to Patient   RxID:   [id number] RITALIN 10 MG  TABS (METHYLPHENIDATE HCL) 2AM & 2 AFTERNOON fill in one month  #120 x 0   Entered by:   Kern Reap CMA (AAMA)   Authorized by:   Roderick Pee MD   Signed by:   Kern Reap CMA (AAMA) on 01/24/2010   Method used:   Print then Give to Patient   RxID:   [id number] RITALIN 10 MG  TABS (METHYLPHENIDATE HCL) 2AM AND 2 AFTERNOON  #120 x 0   Entered by:   Kern Reap CMA (AAMA)   Authorized by:   Roderick Pee MD   Signed by:   Kern Reap CMA (AAMA) on 01/24/2010   Method used:   Print then Give to Patient   RxID:   [id number]

## 2010-03-18 NOTE — Progress Notes (Signed)
Summary: lexapro refill  Phone Note Refill Request Message from:  Fax from Pharmacy on March 28, 2009 5:49 PM  Refills Requested: Medication #1:  LEXAPRO 20 MG TABS take one tablet once daily Initial call taken by: Kern Reap CMA Duncan Dull),  March 28, 2009 5:49 PM    Prescriptions: LEXAPRO 20 MG TABS (ESCITALOPRAM OXALATE) take one tablet once daily  #100 x 1   Entered by:   Kern Reap CMA (AAMA)   Authorized by:   Roderick Pee MD   Signed by:   Kern Reap CMA (AAMA) on 03/28/2009   Method used:   Electronically to        [facility]* (retail)       [address]       [address] Shipping/mailing       [place], [place]  [address]       Ph: [phone]       Fax: [phone]   RxID:   [id number]

## 2010-03-18 NOTE — Progress Notes (Signed)
Summary: new rx needed  Phone Note Call from Patient Call back at Home Phone [phone]   Caller: Patient---live call Summary of Call: Was given rx for Imitrex and she sent that to [facility] Now she would like to switch her rx to the [facility]. please send a new rx. Initial call taken by: Warnell Forester,  April 03, 2009 4:25 PM  Follow-up for Phone Call        ok Follow-up by: Roderick Pee MD,  April 04, 2009 8:21 AM    Prescriptions: SUMATRIPTAN SUCCINATE 100 MG TABS (SUMATRIPTAN SUCCINATE) take one tablet once daily prn  #6 x 11   Entered by:   Kern Reap CMA (AAMA)   Authorized by:   Roderick Pee MD   Signed by:   Kern Reap CMA (AAMA) on 04/04/2009   Method used:   Electronically to        [facility]* (retail)       [address]       [address] Shipping/mailing       [place], [place]  [address]       Ph: [phone]       Fax: [phone]   RxID:   [id number]

## 2010-03-18 NOTE — Assessment & Plan Note (Signed)
Summary: 2WK ROV/NJR   Vital Signs:  Patient profile:   65 year old female Menstrual status:  hysterectomy Weight:      201 pounds Temp:     98.1 degrees F oral BP sitting:   134 / 78  (right arm) Cuff size:   regular  Vitals Entered By: Kathrynn Speed CMA (October 29, 2009 10:13 AM) CC: 2 wk rov, src Is Patient Diabetic? No   CC:  2 wk rov and src.  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  Her BP today by me right arm sitting position 140/86.  Her automatic digital cuff is reading 160/90.  Therefore ask her to get a new cuff.  A repeat her blood pressure readings over 3 week period of time.  She continues to have pain in both thumbs.  She is currently only taking OTC anti-inflammatories.  Advised to see Dr. Ward Givens hand surgeon for consult  Preventive Screening-Counseling & Management  Alcohol-Tobacco     Smoking Status: quit     Year Quit: 81  Current Medications (verified): 1)  Ritalin 10 Mg  Tabs (Methylphenidate Hcl) .... 2am and 2 Afternoon 2)  Baby Aspirin 81 Mg  Chew (Aspirin) .... 2 Once Daily 3)  Betamethasone Dipropionate 0.05 %  Crea (Betamethasone Dipropionate) .... As Needed 4)  Ritalin 10 Mg  Tabs (Methylphenidate Hcl) .... 2am & 2 Afternoon Fill in One Month 5)  Ritalin 10 Mg  Tabs (Methylphenidate Hcl) .... 2am & 2 Afternoon Fill in Two Months 6)  Vicodin Es 7.5-750 Mg  Tabs (Hydrocodone-Acetaminophen) .... Take 1 Tablet By Mouth Two Times A Day Prn 7)  Metrogel 1 %  Gel (Metronidazole) .... Apply At Bedtime 8)  Del-Beta 0.05 %  Lotn (Betamethasone Dipropionate) .... Apply Daily 9)  Sumatriptan Succinate 100 Mg Tabs (Sumatriptan Succinate) .... Take One Tablet Once Daily Prn 10)  Zestoretic 10-12.5 Mg Tabs (Lisinopril-Hydrochlorothiazide) .... Take 1 Tablet By Mouth Every Morning 11)  Zocor 20 Mg Tabs (Simvastatin) .Marland Kitchen.. 1 Tab @ Bedtime  Allergies  (verified): 1)  ! Penicillin G Sodium (Penicillin G Sodium) 2)  ! * Nickel 3)  ! Nsaids  Past History:  Past medical, surgical, family and social histories (including risk factors) reviewed for relevance to current acute and chronic problems.  Past Medical History: Reviewed history from 08/30/2007 and no changes required. Osteoarthritis migraine learning disability Hyperlipidemia psoratic arthritis bilateral total knee replacements Allergic rhinitis hysterectomy ovaries left intact number x 3 tonsillectomy  Past Surgical History: Reviewed history from 11/11/2006 and no changes required. Hysterectomy  Family History: Reviewed history from 08/30/2007 and no changes required. father died at 40, coronary disease, hypertension, smoker mother died 13.  No known coronary disease, coronary bypass obesity  one brother, skin cancertwo sisters have arthritis  Social History: Reviewed history from 06/23/2008 and no changes required. Occupation: Widow/Widower Former Smoker Alcohol use-no Drug use-no brother dies this week and just had funeral  Review of Systems      See HPI  Physical Exam  General:  Well-developed,well-nourished,in no acute distress; alert,appropriate and cooperative throughout examination Heart:  140/86 right arm sitting position   Impression & Recommendations:  Problem # 1:  HYPERTENSION (ICD-401.9) Assessment Unchanged  Her updated medication list for this problem includes:    Zestoretic 10-12.5 Mg Tabs (Lisinopril-hydrochlorothiazide) .Marland Kitchen... Take 1 tablet by mouth every morning  Complete Medication List: 1)  Ritalin 10 Mg Tabs (Methylphenidate hcl) .Marland KitchenMarland KitchenMarland Kitchen  2am and 2 afternoon 2)  Baby Aspirin 81 Mg Chew (Aspirin) .... 2 once daily 3)  Betamethasone Dipropionate 0.05 % Crea (Betamethasone dipropionate) .... As needed 4)  Ritalin 10 Mg Tabs (Methylphenidate hcl) .... 2am & 2 afternoon fill in one month 5)  Ritalin 10 Mg Tabs (Methylphenidate hcl) ....  2am & 2 afternoon fill in two months 6)  Vicodin Es 7.5-750 Mg Tabs (Hydrocodone-acetaminophen) .... Take 1 tablet by mouth two times a day prn 7)  Metrogel 1 % Gel (Metronidazole) .... Apply at bedtime 8)  Del-beta 0.05 % Lotn (Betamethasone dipropionate) .... Apply daily 9)  Sumatriptan Succinate 100 Mg Tabs (Sumatriptan succinate) .... Take one tablet once daily prn 10)  Zestoretic 10-12.5 Mg Tabs (Lisinopril-hydrochlorothiazide) .... Take 1 tablet by mouth every morning 11)  Zocor 20 Mg Tabs (Simvastatin) .Marland Kitchen.. 1 tab @ bedtime  Patient Instructions: 1)  get a pump up digital blood pressure cuff measure your blood pressure daily in the morning.  Return in 3 weeks for follow-up with the data and the device. 2)  Call Dr. Cleone Slim sypher for evaluation of your hands.

## 2010-03-18 NOTE — Progress Notes (Signed)
Summary: lexapro rx  Phone Note Call from Patient Call back at Home Phone [phone]   Caller: Patient Call For: Roderick Pee MD Reason for Call: Talk to Doctor Summary of Call: patient is calling because she feels as though her depression is returning.  she would like to start her lexapro again if this is okay with dr todd.  she has used this before and had good results. Initial call taken by: Kern Reap CMA Duncan Dull),  November 08, 2009 2:19 PM  Follow-up for Phone Call        ok.??????????what dose............., hundred tablets, one nightly, refills x 3 Follow-up by: Roderick Pee MD,  November 08, 2009 2:32 PM    New/Updated Medications: LEXAPRO 20 MG TABS (ESCITALOPRAM OXALATE) take one tab by mouth at bedtime Prescriptions: LEXAPRO 20 MG TABS (ESCITALOPRAM OXALATE) take one tab by mouth at bedtime  #100 x 3   Entered by:   Kern Reap CMA (AAMA)   Authorized by:   Roderick Pee MD   Signed by:   Kern Reap CMA (AAMA) on 11/08/2009   Method used:   Electronically to        [facility]  [phone]* (retail)       [address] [place], [place]  [address]       Ph: [phone] or [phone]       Fax: [phone]   RxID:   [id number]

## 2010-03-18 NOTE — Progress Notes (Signed)
Summary: new rx  Phone Note Call from Patient Call back at Home Phone [phone]   Caller: Patient Call For: Roderick Pee MD Summary of Call: pt needs new rx ritalin 10 mg Initial call taken by: Heron Sabins,  August 02, 2009 2:45 PM    Prescriptions: RITALIN 10 MG  TABS (METHYLPHENIDATE HCL) 2AM & 2 AFTERNOON fill in two months  #120 x 0   Entered by:   Kern Reap CMA (AAMA)   Authorized by:   Roderick Pee MD   Signed by:   Kern Reap CMA (AAMA) on 08/02/2009   Method used:   Print then Give to Patient   RxID:   [id number] RITALIN 10 MG  TABS (METHYLPHENIDATE HCL) 2AM & 2 AFTERNOON fill in one month  #120 x 0   Entered by:   Kern Reap CMA (AAMA)   Authorized by:   Roderick Pee MD   Signed by:   Kern Reap CMA (AAMA) on 08/02/2009   Method used:   Print then Give to Patient   RxID:   [id number] RITALIN 10 MG  TABS (METHYLPHENIDATE HCL) 2AM AND 2 AFTERNOON  #120 x 0   Entered by:   Kern Reap CMA (AAMA)   Authorized by:   Roderick Pee MD   Signed by:   Kern Reap CMA (AAMA) on 08/02/2009   Method used:   Print then Give to Patient   RxID:   [id number]

## 2010-03-19 ENCOUNTER — Telehealth: Payer: Self-pay | Admitting: *Deleted

## 2010-03-19 NOTE — Telephone Encounter (Signed)
Tonya Bartlett was seen it two days ago with a viral syndrome.  She currently has a lot of head congestion, some recurrent nosebleeds, voice loss, and conjunctivitis.  Advise drink lots of liquids pack her nose with Vaseline if the nosebleeds persist, then she will need to see ENT ear, nose, packed advised over-the-counter eyedrops for the conjunctivitis.  Call p.r.n.

## 2010-03-19 NOTE — Telephone Encounter (Signed)
Pt has more cough, laryngitis, pink eye, nose bleeds occasionally. Taking Tylenol and Hydromet. Needs advice.

## 2010-03-26 NOTE — Assessment & Plan Note (Signed)
Summary: sore throat/dm   Vital Signs:  Patient profile:   65 year old female Menstrual status:  hysterectomy Weight:      208 pounds Temp:     98.5 degrees F oral BP sitting:   110 / 80  (left arm) Cuff size:   regular  Vitals Entered By: Kern Reap CMA Duncan Dull) (March 17, 2010 11:24 AM) CC: sore throat 4 days   CC:  sore throat 4 days.  History of Present Illness: Tonya Bartlett is a 65 year old married female, nonsmoker, who comes in with a 5-day history of sore throat and congestion.  Mild cough.  No fever, chills, sputum production, Tetra parent.  The paragraph review of systems negative.  She is having difficulty getting her 10-mg Ritalin.  She takes two tabs b.i.d.  We discussed options.  We will try and give her a prescription for the vyvanse   Allergies: 1)  ! Penicillin G Sodium (Penicillin G Sodium) 2)  ! * Nickel 3)  ! Nsaids  Past History:  Past medical, surgical, family and social histories (including risk factors) reviewed for relevance to current acute and chronic problems.  Past Medical History: Reviewed history from 08/30/2007 and no changes required. Osteoarthritis migraine learning disability Hyperlipidemia psoratic arthritis bilateral total knee replacements Allergic rhinitis hysterectomy ovaries left intact number x 3 tonsillectomy  Past Surgical History: Reviewed history from 11/11/2006 and no changes required. Hysterectomy  Family History: Reviewed history from 08/30/2007 and no changes required. father died at 74, coronary disease, hypertension, smoker mother died 92.  No known coronary disease, coronary bypass obesity  one brother, skin cancertwo sisters have arthritis  Social History: Reviewed history from 06/23/2008 and no changes required. Occupation: Widow/Widower Former Smoker Alcohol use-no Drug use-no brother dies this week and just had funeral  Review of Systems      See HPI  Physical Exam  General:   Well-developed,well-nourished,in no acute distress; alert,appropriate and cooperative throughout examination Head:  Normocephalic and atraumatic without obvious abnormalities. No apparent alopecia or balding. Eyes:  No corneal or conjunctival inflammation noted. EOMI. Perrla. Funduscopic exam benign, without hemorrhages, exudates or papilledema. Vision grossly normal. Ears:  External ear exam shows no significant lesions or deformities.  Otoscopic examination reveals clear canals, tympanic membranes are intact bilaterally without bulging, retraction, inflammation or discharge. Hearing is grossly normal bilaterally. Nose:  External nasal examination shows no deformity or inflammation. Nasal mucosa are pink and moist without lesions or exudates. Mouth:  Oral mucosa and oropharynx without lesions or exudates.  Teeth in good repair. Neck:  No deformities, masses, or tenderness noted. Chest Wall:  No deformities, masses, or tenderness noted. Lungs:  Normal respiratory effort, chest expands symmetrically. Lungs are clear to auscultation, no crackles or wheezes.   Problems:  Medical Problems Added: 1)  Dx of Viral Infection-unspec  (ICD-079.99)  Impression & Recommendations:  Problem # 1:  VIRAL INFECTION-UNSPEC (ICD-079.99) Assessment New  Her updated medication list for this problem includes:    Baby Aspirin 81 Mg Chew (Aspirin) .Marland Kitchen... 2 once daily    Hydromet 5-1.5 Mg/68ml Syrp (Hydrocodone-homatropine) .Marland Kitchen... 1/2 to 1 tsp three times a day as needed  Problem # 2:  LEARNING DISABILITY (ICD-315.2) Assessment: Unchanged  Complete Medication List: 1)  Ritalin 10 Mg Tabs (Methylphenidate hcl) .... 2am and 2 afternoon 2)  Baby Aspirin 81 Mg Chew (Aspirin) .... 2 once daily 3)  Betamethasone Dipropionate 0.05 % Crea (Betamethasone dipropionate) .... As needed 4)  Ritalin 10 Mg Tabs (Methylphenidate hcl) .... 2am &  2 afternoon fill in one month 5)  Ritalin 10 Mg Tabs (Methylphenidate hcl) .... 2am  & 2 afternoon fill in two months 6)  Vicodin Es 7.5-750 Mg Tabs (Hydrocodone-acetaminophen) .... Take 1 tablet by mouth two times a day prn 7)  Metrogel 1 % Gel (Metronidazole) .... Apply at bedtime 8)  Del-beta 0.05 % Lotn (Betamethasone dipropionate) .... Apply daily 9)  Sumatriptan Succinate 100 Mg Tabs (Sumatriptan succinate) .... Take one tablet once daily prn 10)  Zestoretic 10-12.5 Mg Tabs (Lisinopril-hydrochlorothiazide) .... Take 1 tablet by mouth every morning 11)  Zocor 20 Mg Tabs (Simvastatin) .Marland Kitchen.. 1 tab @ bedtime 12)  Celexa 20 Mg Tabs (Citalopram hydrobromide) .... Marland Kitchen1 tab @ bedtime 13)  Vyvanse 50 Mg Caps (Lisdexamfetamine dimesylate) .... Take 1 tablet by mouth every morning 14)  Hydromet 5-1.5 Mg/27ml Syrp (Hydrocodone-homatropine) .... 1/2 to 1 tsp three times a day as needed  Patient Instructions: 1)  Tylenol, Motrin, or aspirin for gentle symptoms. 2)  Chloraseptic lozenges p.r.n.Marland Kitchen 3)  Hydromet one half to 1 teaspoon nightly p.r.n. 4)  Switch to  Vyvanse  Prescriptions: HYDROMET 5-1.5 MG/5ML SYRP (HYDROCODONE-HOMATROPINE) 1/2 to 1 tsp three times a day as needed  #4oz x 1   Entered and Authorized by:   Roderick Pee MD   Signed by:   Roderick Pee MD on 03/17/2010   Method used:   Print then Give to Patient   RxID:   [id number] VYVANSE 50 MG CAPS (LISDEXAMFETAMINE DIMESYLATE) Take 1 tablet by mouth every morning  #30 x 0   Entered and Authorized by:   Roderick Pee MD   Signed by:   Roderick Pee MD on 03/17/2010   Method used:   Print then Give to Patient   RxID:   [id number]    Orders Added: 1)  Est. Patient Level III [84696]

## 2010-04-25 ENCOUNTER — Telehealth: Payer: Self-pay | Admitting: Family Medicine

## 2010-04-25 NOTE — Telephone Encounter (Signed)
Triage vm------refill Ritalin 10mg   qid   #120 per month. Please call when ready.

## 2010-04-28 MED ORDER — METHYLPHENIDATE HCL 10 MG PO TABS: 10.0000 mg | ORAL_TABLET | Freq: Four times a day (QID) | ORAL | Status: DC

## 2010-04-28 NOTE — Telephone Encounter (Signed)
rx ready for pick up and patient is aware  

## 2010-05-15 ENCOUNTER — Other Ambulatory Visit: Payer: Self-pay | Admitting: *Deleted

## 2010-05-15 MED ORDER — HYDROCODONE-ACETAMINOPHEN 7.5-750 MG PO TABS: 1.0000 | ORAL_TABLET | Freq: Two times a day (BID) | ORAL | Status: DC

## 2010-05-15 NOTE — Telephone Encounter (Signed)
ok 

## 2010-05-15 NOTE — Telephone Encounter (Signed)
rx sent and patient is aware 

## 2010-05-15 NOTE — Telephone Encounter (Signed)
patient  Is calling because she would like a refill of her vicodin es bid.  Last office visit was 03/17/10 and last refill was 10/15/09.  Is this okay to fill?

## 2010-05-22 LAB — POCT HEMOGLOBIN-HEMACUE: Hemoglobin: 13.5 g/dL (ref 12.0–15.0)

## 2010-07-01 NOTE — Op Note (Signed)
NAME:  Tonya Bartlett, Tonya Bartlett               ACCOUNT NO.:  [id number]   MEDICAL RECORD NO.:  [id number]          PATIENT TYPE:  AMB   LOCATION:  DAY                          FACILITY:  [facility]   PHYSICIAN:  Ronald A. Gioffre, M.D.DATE OF BIRTH:  12/07/1945   DATE OF PROCEDURE:  07/13/2007  DATE OF DISCHARGE:                               OPERATIVE REPORT   SURGEON:  Georges Lynch. Darrelyn Hillock, M.D.   ASSISTANT:  Jamelle Rushing, P.A.   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   POSTOPERATIVE DIAGNOSIS:  Complete retracted tear of the right rotator  cuff tendon.   OPERATIONS:  1. Partial acromionectomy and acromioplasty of the right shoulder.  2. Repair of a complete retracted tear of the right rotator cuff      tendon.  3. Restore tendon graft to the right rotator cuff utilizing two 4-      pronged Mitek metal anchors.   PROCEDURE:  Under general anesthesia, routine orthopedic prep and  draping of the right shoulder was carried out.  Prior to this general  anesthetic, she had an interscalene nerve block given to her in the  holding area.  When taken back to surgery, a sterile prep and drape of  the right shoulder was carried out.  Incision was made over the anterior  aspect of right shoulder.  Bleeders identified and cauterized.  At this  time, I separated deltoid tendon by sharp dissection from the overlying  acromion.  Following that, a small portion of the deltoid tendon was  also proximally, and I went down and identified the acromion and the  subdeltoid bursa.  I excised the subdeltoid bursa, which was quite  thickened.  At that time, I noted a large tear of the rotator cuff.  I  protected her rotator cuff with a Bennett retractor and did a partial  acromionectomy with the oscillating saw, then utilized the bur to even  out the undersurface of the acromion.  Once we reestablished the  subacromial space, we irrigated the area out.  We burred the  lateral  articular surface of the humerus as well.  I then irrigated again and  suctioned the fluid out.  Following that, I then did a side-to-side  repair of the rotator cuff utilizing #1 Ethibond suture.  I then  inserted two 4-pronged Mitek anchors into the proximal humerus.  I then  utilized a Restore tendon graft to reinforce the tendon repair and  anchored that down distally with the Mitek anchor sutures.  I thoroughly  irrigated out the area, reapproximated the deltoid tendon and the  muscles in the usual fashion.  Subcutaneous was closed with 0 Vicryl,  skin with metal staples.  Sterile Neosporin dressing was applied, and  she was placed in a shoulder immobilizer.           ______________________________  Georges Lynch Darrelyn Hillock, M.D.     RAG/MEDQ  D:  07/13/2007  T:  07/13/2007  Job:  [id number]

## 2010-07-04 NOTE — Assessment & Plan Note (Signed)
[facility] OFFICE NOTE   Marjon, Doxtater Tonya Bartlett                      MRN:          [id number]  DATE:09/02/2005                            DOB:          1945-12-21    REASON FOR VISIT:  This is a 65 year old woman here for a complete physical  examination.  I just had an introductory visit with her on August 04, 2005.  I  refer you to this note concerning details of her past medical history,  family history, social history, habits, etc.  We have several additional  things to talk about.  She has a history of depression and has now started  seeing Dr. Dub Mikes for this.  He recently put her on Lexapro instead of her  previous Zoloft, and she feels that it is helpful.  As noted before, she is  a recovering alcoholic.  She has had an abdominal hysterectomy with ovaries  left intact.  She also complains of some tenderness and pain in the left  groin region, especially when she lifts her leg.  This has been going on for  about a month.  There is no history of trauma, but she does try to exercise  regularly, especially by walking on a treadmill.   ALLERGIES:  PENICILLIN, NICKEL, NONSTEROIDAL ANTI-INFLAMMATORY MEDICATIONS.   CURRENT MEDICATIONS:  1.  Fosamax 70 mg weekly.  2.  Methotrexate 2.5 mg once a week.  3.  Folic acid daily.  4.  Lipitor 40 mg daily.  5.  Lexapro 15 mg daily.  6.  Hydrocodone as needed for headaches.  7.  Imitrex 100 mg as needed for headaches.  8.  Diprolene AF lotion b.i.d. as needed for psoriasis flareups.   OBJECTIVE:  VITAL SIGNS:  Weight 216.  Blood pressure 160/80, pulse 70 and  regular, respirations 12 and regular.  GENERAL:  She is a little overweight.  SKIN:  Free of significant lesions, except for some mild erythematous scaly  patches consistent with psoriasis over the knees and elbows.  HEENT:  Eyes with clear sclerae.  Pharynx clear.  NECK:  Supple without lymphadenopathy,  masses.  LUNGS:  Clear.  CARDIAC:  Regular rate and rhythm.  No murmurs, rubs, or gallops.  Full EKG  is within normal limits.  BREASTS:  Right breast and axilla are clear.  Left breast shows some  tenderness to palpation in the upper outer quadrant, but no specific masses  were palpated.  Axilla is clear.  There are no skin changes, no nipple  discharge.  ABDOMEN:  Soft.  Normal bowel sounds.  Nontender.  No masses.  PELVIC:  External genitalia within normal limits.  Bimanual exam reveals no  masses or tenderness in the adnexa.  RECTAL:  No masses or tenderness.  Stool Hemoccult-negative.  EXTREMITIES:  No clubbing, cyanosis, or edema.  She is tender over the left  hip flexor tendon at its insertion to the pubis.  Hip itself shows full  range of motion.  NEUROLOGIC:  Grossly intact.   LABORATORY DATA:  She was here for fasting labs on  August 27, 2005.  These  were all within normal limits.   ASSESSMENT AND PLAN:  1.  Complete physical exam.  She is due for a mammogram, as noted below.  2.  Left breast tenderness.  When we schedule the mammogram, we will ask      them to pay special attention to the upper outer quadrant of the left      breast.  3.  Osteoporosis, stable.  4.  A combination of degenerative and psoriatic arthritis, stable.  5.  Depression, per Dr. Dub Mikes.  6.  Hyperlipidemia, stable.  7.  Hip flexor strain.  I told her that this should resolve on its own.  She      is to rest it and take it easy with her exercise regimen for several      weeks.                                   Tera Mater. Clent Ridges, MD   SAF/MedQ  DD:  09/03/2005  DT:  09/03/2005  Job #:  [id number]

## 2010-07-08 ENCOUNTER — Other Ambulatory Visit: Payer: Self-pay | Admitting: Family Medicine

## 2010-07-08 NOTE — Telephone Encounter (Signed)
Pt needs new rx ritalin 10 mg °

## 2010-07-10 ENCOUNTER — Other Ambulatory Visit: Payer: Self-pay | Admitting: Family Medicine

## 2010-07-10 MED ORDER — METHYLPHENIDATE HCL 10 MG PO TABS
ORAL_TABLET | ORAL | Status: DC
Start: 2010-07-10 — End: 2011-07-04

## 2010-07-10 MED ORDER — METHYLPHENIDATE HCL 10 MG PO TABS: ORAL_TABLET | ORAL | Status: DC

## 2010-07-11 NOTE — Telephone Encounter (Signed)
rx is ready for pick up. Left message on machine for patient   

## 2010-09-17 ENCOUNTER — Telehealth: Payer: Self-pay | Admitting: Family Medicine

## 2010-09-17 NOTE — Telephone Encounter (Signed)
Pt requesting refill on methylphenidate (RITALIN) 10 MG . Please contact when ready.

## 2010-09-22 MED ORDER — METHYLPHENIDATE HCL 10 MG PO TABS: ORAL_TABLET | ORAL | Status: DC

## 2010-09-22 MED ORDER — METHYLPHENIDATE HCL 10 MG PO TABS: 10.0000 mg | ORAL_TABLET | Freq: Two times a day (BID) | ORAL | Status: DC

## 2010-09-22 NOTE — Telephone Encounter (Signed)
rx ready for pick up and patient is aware.  1st refill shredded.

## 2010-10-01 ENCOUNTER — Other Ambulatory Visit: Payer: Self-pay | Admitting: Family Medicine

## 2010-10-01 DIAGNOSIS — Z1231 Encounter for screening mammogram for malignant neoplasm of breast: Secondary | ICD-10-CM

## 2010-10-14 ENCOUNTER — Ambulatory Visit
Admission: RE | Admit: 2010-10-14 | Discharge: 2010-10-14 | Disposition: A | Discharge status: Home / Self Care | Payer: Medicare Other | Source: Ambulatory Visit | Attending: Family Medicine | Admitting: Family Medicine

## 2010-10-14 DIAGNOSIS — Z1231 Encounter for screening mammogram for malignant neoplasm of breast: Secondary | ICD-10-CM

## 2010-11-11 ENCOUNTER — Encounter: Payer: Self-pay | Admitting: Family Medicine

## 2010-11-12 ENCOUNTER — Encounter: Payer: Self-pay | Admitting: Family Medicine

## 2010-11-12 ENCOUNTER — Ambulatory Visit (INDEPENDENT_AMBULATORY_CARE_PROVIDER_SITE_OTHER): Payer: Medicare Other | Admitting: Family Medicine

## 2010-11-12 DIAGNOSIS — N3000 Acute cystitis without hematuria: Secondary | ICD-10-CM

## 2010-11-12 DIAGNOSIS — Z0000 Encounter for general adult medical examination without abnormal findings: Secondary | ICD-10-CM

## 2010-11-12 DIAGNOSIS — E785 Hyperlipidemia, unspecified: Secondary | ICD-10-CM

## 2010-11-12 DIAGNOSIS — I10 Essential (primary) hypertension: Secondary | ICD-10-CM

## 2010-11-12 DIAGNOSIS — J309 Allergic rhinitis, unspecified: Secondary | ICD-10-CM

## 2010-11-12 DIAGNOSIS — E669 Obesity, unspecified: Secondary | ICD-10-CM

## 2010-11-12 DIAGNOSIS — M1990 Unspecified osteoarthritis, unspecified site: Secondary | ICD-10-CM

## 2010-11-12 DIAGNOSIS — G43909 Migraine, unspecified, not intractable, without status migrainosus: Secondary | ICD-10-CM

## 2010-11-12 DIAGNOSIS — F329 Major depressive disorder, single episode, unspecified: Secondary | ICD-10-CM

## 2010-11-12 LAB — COMPREHENSIVE METABOLIC PANEL
ALT: 18
AST: 19
Alkaline Phosphatase: 67
CO2: 30
GFR calc Af Amer: >60
GFR calc non Af Amer: >60
Glucose, Bld: 94
Potassium: 3.3 — ABNORMAL LOW
Sodium: 140
Total Protein: 6.4

## 2010-11-12 LAB — URINALYSIS, ROUTINE W REFLEX MICROSCOPIC
Bilirubin Urine: NEGATIVE
Glucose, UA: NEGATIVE
Hgb urine dipstick: NEGATIVE
Specific Gravity, Urine: 1.009
Urobilinogen, UA: 0.2

## 2010-11-12 LAB — CBC
Hemoglobin: 11.9 — ABNORMAL LOW
RBC: 3.55 — ABNORMAL LOW

## 2010-11-12 LAB — HEPATIC FUNCTION PANEL
ALT: 21 U/L (ref 0–35)
AST: 21 U/L (ref 0–37)
Albumin: 4.3 g/dL (ref 3.5–5.2)
Alkaline Phosphatase: 56 U/L (ref 39–117)
Total Protein: 7.4 g/dL (ref 6.0–8.3)

## 2010-11-12 LAB — POCT URINALYSIS DIPSTICK
Bilirubin, UA: NEGATIVE
Glucose, UA: NEGATIVE
Ketones, UA: NEGATIVE
Leukocytes, UA: NEGATIVE
Protein, UA: NEGATIVE

## 2010-11-12 LAB — TYPE AND SCREEN: Antibody Screen: NEGATIVE

## 2010-11-12 LAB — DIFFERENTIAL
Basophils Relative: 1
Eosinophils Absolute: 0.1
Eosinophils Relative: 2
Monocytes Relative: 9
Neutrophils Relative %: 68

## 2010-11-12 LAB — CBC WITH DIFFERENTIAL/PLATELET
Basophils Relative: 0.9 % (ref 0.0–3.0)
Eosinophils Relative: 1.4 % (ref 0.0–5.0)
HCT: 37.1 % (ref 36.0–46.0)
Hemoglobin: 12.5 g/dL (ref 12.0–15.0)
Lymphocytes Relative: 26.8 % (ref 12.0–46.0)
Monocytes Relative: 8.5 % (ref 3.0–12.0)
Neutro Abs: 2.7 10*3/uL (ref 1.4–7.7)
RBC: 3.69 Mil/uL — ABNORMAL LOW (ref 3.87–5.11)

## 2010-11-12 LAB — PROTIME-INR: INR: 1

## 2010-11-12 LAB — BASIC METABOLIC PANEL
CO2: 30 mEq/L (ref 19–32)
Calcium: 9.3 mg/dL (ref 8.4–10.5)
Creatinine, Ser: 0.6 mg/dL (ref 0.4–1.2)

## 2010-11-12 LAB — LIPID PANEL: VLDL: 13.2 mg/dL (ref 0.0–40.0)

## 2010-11-12 LAB — TSH: TSH: 0.93 u[IU]/mL (ref 0.35–5.50)

## 2010-11-12 LAB — ABO/RH: ABO/RH(D): A POS

## 2010-11-12 MED ORDER — HYDROCODONE-ACETAMINOPHEN 7.5-750 MG PO TABS: ORAL_TABLET | ORAL | Status: DC

## 2010-11-12 MED ORDER — METRONIDAZOLE 1 % EX GEL: Freq: Every day | CUTANEOUS | Status: DC

## 2010-11-12 MED ORDER — LISINOPRIL-HYDROCHLOROTHIAZIDE 10-12.5 MG PO TABS: 1.0000 | ORAL_TABLET | Freq: Every day | ORAL | Status: DC

## 2010-11-12 MED ORDER — CITALOPRAM HYDROBROMIDE 20 MG PO TABS: 20.0000 mg | ORAL_TABLET | Freq: Every day | ORAL | Status: DC

## 2010-11-12 MED ORDER — BETAMETHASONE DIPROPIONATE 0.05 % EX LOTN: TOPICAL_LOTION | CUTANEOUS | Status: DC

## 2010-11-12 NOTE — Patient Instructions (Signed)
Continue your current medications.  Return in one year or sooner if any problem 

## 2010-11-12 NOTE — Progress Notes (Signed)
  Subjective:    Patient ID: Tonya Bartlett, female    DOB: March 15, 1945, 65 y.o.   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 Who comes in today for evaluation of mild depression, hypertension, rosacea, hyperlipidemia, migraine headaches, ADD and degenerative joint disease.  This is for Medicare wellness examination.  Her medications were reviewed in detail.  This been no changes.  She currently takes Vicodin one half to one tablet twice daily for neck and shoulder pain.  She is seeing a rheumatologist also.  Her vision is normal.  She recently had her cataracts removed.  The hearing declined.  Recommend audiogram.  Regular dental care.  Cognitive function, normal.  Activities of daily living, normal.  She still works full time.  Home health safety reviewed note issues identified, no guns in the house, she does have a health care with power-of-attorney, and a living will.  She does not do BSE monthly.  However, she does get an annual mammogram.  She gets regular dental care.  Colonoscopy normal, that is 2008, Pneumovax 2011, seasonal flu shot today, information given on the shingles.  Vaccine.  She is intolerant of all NSAIDs.   Review of Systems  Constitutional: Negative.   HENT: Negative.   Eyes: Negative.   Respiratory: Negative.   Cardiovascular: Negative.   Gastrointestinal: Negative.   Genitourinary: Negative.   Musculoskeletal: Positive for myalgias and back pain.  Neurological: Negative.   Hematological: Negative.   Psychiatric/Behavioral: Negative.        Objective:   Physical Exam  Constitutional: She appears well-developed and well-nourished.  HENT:  Head: Normocephalic and atraumatic.  Right Ear: External ear normal.  Left Ear: External ear normal.  Nose: Nose normal.  Mouth/Throat: Oropharynx is clear and moist.  Eyes: EOM are normal. Pupils are equal, round, and reactive to light.   Neck: Normal range of motion. Neck supple. No thyromegaly present.  Cardiovascular: Normal rate, regular rhythm, normal heart sounds and intact distal pulses.  Exam reveals no gallop and no friction rub.   No murmur heard. Pulmonary/Chest: Effort normal and breath sounds normal.  Abdominal: Soft. Bowel sounds are normal. She exhibits no distension and no mass. There is no tenderness. There is no rebound.  Genitourinary: Vagina normal. Guaiac negative stool. No vaginal discharge found.  Musculoskeletal: Normal range of motion.  Lymphadenopathy:    She has no cervical adenopathy.  Neurological: She is alert. She has normal reflexes. No cranial nerve deficit. She exhibits normal muscle tone. Coordination normal.  Skin: Skin is warm and dry.       Total body skin exam normal.  She has numerous seborrheic keratosis also scar right shoulder from previous surgery.  Psychiatric: She has a normal mood and affect. Her behavior is normal. Judgment and thought content normal.          Assessment & Plan:  Healthy female.  History of mild depression continue Celexa 20 mg daily.  History of hypertension.  Continue Zestoretic 10 to 12.5 daily.  Rosacea continue MetroGel p.r.n.  Hyperlipidemia.  Continue Zocor 20 mg nightly along with a baby aspirin tablet.  History of migraine hemic headaches Imitrex p.r.n.  History of ADD.  Continue Ritalin 10 mg b.i.d.  Recommended shingles.  Vaccination and audiogram

## 2010-12-05 ENCOUNTER — Other Ambulatory Visit: Payer: Self-pay | Admitting: Family Medicine

## 2010-12-26 ENCOUNTER — Other Ambulatory Visit: Payer: Self-pay | Admitting: Family Medicine

## 2010-12-26 NOTE — Telephone Encounter (Signed)
Pt called req refill of methylphenidate (RITALIN) 10 MG tablet

## 2010-12-29 MED ORDER — METHYLPHENIDATE HCL 10 MG PO TABS: ORAL_TABLET | ORAL | Status: DC

## 2010-12-29 NOTE — Telephone Encounter (Signed)
rx ready for pick up and patient is aware  

## 2011-03-12 ENCOUNTER — Other Ambulatory Visit: Payer: Self-pay | Admitting: *Deleted

## 2011-03-12 DIAGNOSIS — I10 Essential (primary) hypertension: Secondary | ICD-10-CM

## 2011-03-12 DIAGNOSIS — M1990 Unspecified osteoarthritis, unspecified site: Secondary | ICD-10-CM

## 2011-03-12 DIAGNOSIS — N3000 Acute cystitis without hematuria: Secondary | ICD-10-CM

## 2011-03-12 DIAGNOSIS — E785 Hyperlipidemia, unspecified: Secondary | ICD-10-CM

## 2011-03-12 DIAGNOSIS — J309 Allergic rhinitis, unspecified: Secondary | ICD-10-CM

## 2011-03-12 DIAGNOSIS — G43909 Migraine, unspecified, not intractable, without status migrainosus: Secondary | ICD-10-CM

## 2011-03-12 DIAGNOSIS — E669 Obesity, unspecified: Secondary | ICD-10-CM

## 2011-03-12 MED ORDER — HYDROCODONE-ACETAMINOPHEN 7.5-750 MG PO TABS: ORAL_TABLET | ORAL | Status: DC

## 2011-03-12 NOTE — Telephone Encounter (Signed)
Patient is calling for a refill of Vicodin es , 1 tab twice daily.  Is this okay to fill? [facility]

## 2011-03-12 NOTE — Telephone Encounter (Signed)
Okay x 6 months

## 2011-03-26 ENCOUNTER — Telehealth: Payer: Self-pay | Admitting: Family Medicine

## 2011-03-26 MED ORDER — METHYLPHENIDATE HCL 10 MG PO TABS: ORAL_TABLET | ORAL | Status: DC

## 2011-03-26 NOTE — Telephone Encounter (Signed)
Pt called req 3 scripts for methylphenidate (RITALIN) 10 MG tablet

## 2011-03-27 NOTE — Telephone Encounter (Signed)
rx ready for pick up and patient is aware  

## 2011-06-26 ENCOUNTER — Telehealth: Payer: Self-pay | Admitting: Family Medicine

## 2011-06-26 MED ORDER — METHYLPHENIDATE HCL 10 MG PO TABS: ORAL_TABLET | ORAL | Status: DC

## 2011-06-26 NOTE — Telephone Encounter (Signed)
Rx ready for pick up and patient is aware 

## 2011-06-26 NOTE — Telephone Encounter (Signed)
Patient called stating that she need a refill of her ritalin. Please assist.  °

## 2011-07-04 ENCOUNTER — Emergency Department (HOSPITAL_COMMUNITY): Payer: Medicare Other

## 2011-07-04 ENCOUNTER — Encounter (HOSPITAL_COMMUNITY): Payer: Self-pay | Admitting: *Deleted

## 2011-07-04 ENCOUNTER — Emergency Department (HOSPITAL_COMMUNITY)
Admission: EM | Admit: 2011-07-04 | Discharge: 2011-07-04 | Disposition: A | Discharge status: Home / Self Care | Payer: Medicare Other | Attending: Emergency Medicine | Admitting: Emergency Medicine

## 2011-07-04 DIAGNOSIS — Z79899 Other long term (current) drug therapy: Secondary | ICD-10-CM | POA: Insufficient documentation

## 2011-07-04 DIAGNOSIS — R110 Nausea: Secondary | ICD-10-CM | POA: Insufficient documentation

## 2011-07-04 DIAGNOSIS — R197 Diarrhea, unspecified: Secondary | ICD-10-CM | POA: Insufficient documentation

## 2011-07-04 DIAGNOSIS — E785 Hyperlipidemia, unspecified: Secondary | ICD-10-CM | POA: Insufficient documentation

## 2011-07-04 DIAGNOSIS — R509 Fever, unspecified: Secondary | ICD-10-CM | POA: Insufficient documentation

## 2011-07-04 DIAGNOSIS — K529 Noninfective gastroenteritis and colitis, unspecified: Secondary | ICD-10-CM

## 2011-07-04 DIAGNOSIS — R630 Anorexia: Secondary | ICD-10-CM | POA: Insufficient documentation

## 2011-07-04 DIAGNOSIS — Z7982 Long term (current) use of aspirin: Secondary | ICD-10-CM | POA: Insufficient documentation

## 2011-07-04 DIAGNOSIS — R109 Unspecified abdominal pain: Secondary | ICD-10-CM | POA: Insufficient documentation

## 2011-07-04 DIAGNOSIS — I10 Essential (primary) hypertension: Secondary | ICD-10-CM | POA: Insufficient documentation

## 2011-07-04 DIAGNOSIS — M1990 Unspecified osteoarthritis, unspecified site: Secondary | ICD-10-CM | POA: Insufficient documentation

## 2011-07-04 DIAGNOSIS — K5289 Other specified noninfective gastroenteritis and colitis: Secondary | ICD-10-CM | POA: Insufficient documentation

## 2011-07-04 LAB — COMPREHENSIVE METABOLIC PANEL
AST: 18 U/L (ref 0–37)
Albumin: 3.6 g/dL (ref 3.5–5.2)
Alkaline Phosphatase: 85 U/L (ref 39–117)
BUN: 8 mg/dL (ref 6–23)
CO2: 23 mEq/L (ref 19–32)
Chloride: 96 mEq/L (ref 96–112)
Creatinine, Ser: 0.53 mg/dL (ref 0.50–1.10)
GFR calc non Af Amer: >90 mL/min (ref >90)
Potassium: 3.1 mEq/L — ABNORMAL LOW (ref 3.5–5.1)
Total Bilirubin: 0.5 mg/dL (ref 0.3–1.2)

## 2011-07-04 LAB — URINALYSIS, ROUTINE W REFLEX MICROSCOPIC
Glucose, UA: NEGATIVE mg/dL
Ketones, ur: >80 mg/dL — AB
Leukocytes, UA: NEGATIVE
Nitrite: NEGATIVE
Specific Gravity, Urine: 1.029 (ref 1.005–1.030)
pH: 6.5 (ref 5.0–8.0)

## 2011-07-04 LAB — CBC
HCT: 36.6 % (ref 36.0–46.0)
Hemoglobin: 12.6 g/dL (ref 12.0–15.0)
MCHC: 34.4 g/dL (ref 30.0–36.0)
RBC: 3.87 MIL/uL (ref 3.87–5.11)
WBC: 9.7 10*3/uL (ref 4.0–10.5)

## 2011-07-04 LAB — DIFFERENTIAL
Basophils Relative: 0 % (ref 0–1)
Lymphocytes Relative: 10 % — ABNORMAL LOW (ref 12–46)
Lymphs Abs: 0.9 10*3/uL (ref 0.7–4.0)
Monocytes Absolute: 0.8 10*3/uL (ref 0.1–1.0)
Monocytes Relative: 9 % (ref 3–12)
Neutro Abs: 7.9 10*3/uL — ABNORMAL HIGH (ref 1.7–7.7)
Neutrophils Relative %: 81 % — ABNORMAL HIGH (ref 43–77)

## 2011-07-04 LAB — URINE MICROSCOPIC-ADD ON

## 2011-07-04 LAB — LACTIC ACID, PLASMA: Lactic Acid, Venous: 1 mmol/L (ref 0.5–2.2)

## 2011-07-04 LAB — LIPASE, BLOOD: Lipase: 9 U/L — ABNORMAL LOW (ref 11–59)

## 2011-07-04 MED ORDER — HYDROMORPHONE HCL PF 1 MG/ML IJ SOLN
1.0000 mg | Freq: Once | INTRAMUSCULAR | Status: AC
  Administered 2011-07-04: 1 mg via INTRAVENOUS
  Filled 2011-07-04: qty 1

## 2011-07-04 MED ORDER — SODIUM CHLORIDE 0.9 % IV SOLN
1000.0000 mL | INTRAVENOUS | Status: DC
  Administered 2011-07-04: 1000 mL via INTRAVENOUS

## 2011-07-04 MED ORDER — IOHEXOL 300 MG/ML  SOLN
40.0000 mL | Freq: Once | INTRAMUSCULAR | Status: AC | PRN
  Administered 2011-07-04: 40 mL via ORAL

## 2011-07-04 MED ORDER — METRONIDAZOLE 500 MG PO TABS: 500.0000 mg | ORAL_TABLET | Freq: Two times a day (BID) | ORAL | Status: AC

## 2011-07-04 MED ORDER — CIPROFLOXACIN HCL 500 MG PO TABS: 500.0000 mg | ORAL_TABLET | Freq: Two times a day (BID) | ORAL | Status: AC

## 2011-07-04 MED ORDER — IOHEXOL 300 MG/ML  SOLN
100.0000 mL | Freq: Once | INTRAMUSCULAR | Status: AC | PRN
  Administered 2011-07-04: 100 mL via INTRAVENOUS

## 2011-07-04 MED ORDER — ONDANSETRON HCL 4 MG/2ML IJ SOLN
4.0000 mg | Freq: Once | INTRAMUSCULAR | Status: AC
  Administered 2011-07-04: 4 mg via INTRAVENOUS
  Filled 2011-07-04: qty 2

## 2011-07-04 NOTE — Discharge Instructions (Signed)
Get plenty of rest, and drink a lot of fluids. Return here if needed for problems.   Diet for Diarrhea, Adult Having frequent, runny stools (diarrhea) has many causes. Diarrhea may be caused or worsened by food or drink. Diarrhea may be relieved by changing your diet. IF YOU ARE NOT TOLERATING SOLID FOODS:  Drink enough water and fluids to keep your urine clear or pale yellow.   Avoid sugary drinks and sodas as well as milk-based beverages.   Avoid beverages containing caffeine and alcohol.   You may try rehydrating beverages. You can make your own by following this recipe:    tsp table salt.    tsp baking soda.   ? tsp salt substitute (potassium chloride).   1 tbs + 1 tsp sugar.   1 qt water.  As your stools become more solid, you can start eating solid foods. Add foods one at a time. If a certain food causes your diarrhea to get worse, avoid that food and try other foods. A low fiber, low-fat, and lactose-free diet is recommended. Small, frequent meals may be better tolerated.  Starches  Allowed:  White, [place], and pita breads, plain rolls, buns, bagels. Plain muffins, matzo. Soda, saltine, or graham crackers. Pretzels, melba toast, zwieback. Cooked cereals made with water: cornmeal, farina, cream cereals. Dry cereals: refined corn, wheat, rice. Potatoes prepared any way without skins, refined macaroni, spaghetti, noodles, refined rice.   Avoid:  Bread, rolls, or crackers made with whole wheat, multi-grains, rye, bran seeds, nuts, or coconut. Corn tortillas or taco shells. Cereals containing whole grains, multi-grains, bran, coconut, nuts, or raisins. Cooked or dry oatmeal. Coarse wheat cereals, granola. Cereals advertised as "high-fiber." Potato skins. Whole grain pasta, wild or brown rice. Popcorn. Sweet potatoes/yams. Sweet rolls, doughnuts, waffles, pancakes, sweet breads.  Vegetables  Allowed: Strained tomato and vegetable juices. Most well-cooked and canned vegetables  without seeds. Fresh: Tender lettuce, cucumber without the skin, cabbage, spinach, bean sprouts.   Avoid: Fresh, cooked, or canned: Artichokes, baked beans, beet greens, broccoli, Brussels sprouts, corn, kale, legumes, peas, sweet potatoes. Cooked: Green or red cabbage, spinach. Avoid large servings of any vegetables, because vegetables shrink when cooked, and they contain more fiber per serving than fresh vegetables.  Fruit  Allowed: All fruit juices except prune juice. Cooked or canned: Apricots, applesauce, cantaloupe, cherries, fruit cocktail, grapefruit, grapes, kiwi, mandarin oranges, peaches, pears, plums, watermelon. Fresh: Apples without skin, ripe banana, grapes, cantaloupe, cherries, grapefruit, peaches, oranges, plums. Keep servings limited to  cup or 1 piece.   Avoid: Fresh: Apple with skin, apricots, mango, pears, raspberries, strawberries. Prune juice, stewed or dried prunes. Dried fruits, raisins, dates. Large servings of all fresh fruits.  Meat and Meat Substitutes  Allowed: Ground or well-cooked tender beef, ham, veal, lamb, pork, or poultry. Eggs, plain cheese. Fish, oysters, shrimp, lobster, other seafoods. Liver, organ meats.   Avoid: Tough, fibrous meats with gristle. Peanut butter, smooth or chunky. Cheese, nuts, seeds, legumes, dried peas, beans, lentils.  Milk  Allowed: Yogurt, lactose-free milk, kefir, drinkable yogurt, buttermilk, soy milk.   Avoid: Milk, chocolate milk, beverages made with milk, such as milk shakes.  Soups  Allowed: Bouillon, broth, or soups made from allowed foods. Any strained soup.   Avoid: Soups made from vegetables that are not allowed, cream or milk-based soups.  Desserts and Sweets  Allowed: Sugar-free gelatin, sugar-free frozen ice pops made without sugar alcohol.   Avoid: Plain cakes and cookies, pie made with allowed fruit, pudding, custard,  cream pie. Gelatin, fruit, ice, sherbet, frozen ice pops. Ice cream, ice milk without nuts.  Plain hard candy, honey, jelly, molasses, syrup, sugar, chocolate syrup, gumdrops, marshmallows.  Fats and Oils  Allowed: Avoid any fats and oils.   Avoid: Seeds, nuts, olives, avocados. Margarine, butter, cream, mayonnaise, salad oils, plain salad dressings made from allowed foods. Plain gravy, crisp bacon without rind.  Beverages  Allowed: Water, decaffeinated teas, oral rehydration solutions, sugar-free beverages.   Avoid: Fruit juices, caffeinated beverages (coffee, tea, soda or pop), alcohol, sports drinks, or lemon-lime soda or pop.  Condiments  Allowed: Ketchup, mustard, horseradish, vinegar, cream sauce, cheese sauce, cocoa powder. Spices in moderation: allspice, basil, bay leaves, celery powder or leaves, cinnamon, cumin powder, curry powder, ginger, mace, marjoram, onion or garlic powder, oregano, paprika, parsley flakes, ground pepper, rosemary, sage, savory, tarragon, thyme, turmeric.   Avoid: Coconut, honey.  Weight Monitoring: Weigh yourself every day. You should weigh yourself in the morning after you urinate and before you eat breakfast. Wear the same amount of clothing when you weigh yourself. Record your weight daily. Bring your recorded weights to your clinic visits. Tell your caregiver right away if you have gained 3 lb/1.4 kg or more in 1 day, 5 lb/2.3 kg in a week, or whatever amount you were told to report. SEEK IMMEDIATE MEDICAL CARE IF:   You are unable to keep fluids down.   You start to throw up (vomit) or diarrhea keeps coming back (persistent).   Abdominal pain develops, increases, or can be felt in one place (localizes).   You have an oral temperature above 102 F (38.9 C), not controlled by medicine.   Diarrhea contains blood or mucus.   You develop excessive weakness, dizziness, fainting, or extreme thirst.  MAKE SURE YOU:   Understand these instructions.   Will watch your condition.   Will get help right away if you are not doing well or get  worse.  Document Released: 04/25/2003 Document Revised: 01/22/2011 Document Reviewed: 08/16/2008 [facility] Patient Information 2012 [place], [place].

## 2011-07-04 NOTE — ED Notes (Signed)
Patient advises that pain is much better she is discharged with instructions and verbalizes an understanding.

## 2011-07-04 NOTE — ED Notes (Signed)
Pt started feeling bad Tuesday nite and went into work and then went back home.  Pt has had flucuation in temperatures since.  Pt is upper abdominal pain.  Pt is having soft stools and then started vomiting last nite.  No urinary s/s

## 2011-07-04 NOTE — ED Provider Notes (Signed)
History     CSN: [id number]  Arrival date & time 07/04/11  4098   First MD Initiated Contact with Patient 07/04/11 [phone]      Chief Complaint  Patient presents with  . Abdominal Pain    (Consider location/radiation/quality/duration/timing/severity/associated sxs/prior treatment) HPI Comments: Tonya Bartlett is a 66 y.o. Female who complains of diarrhea and abdominal pain for 2 days with occasional nausea and anorexia. She has a history of diverticulosis shown a recent colonoscopy. She's never had diverticulitis. She has a low-grade fever. Her stool has been brown in color and and loose. She has not seen a dark-colored or bright blood in the stool. She has no known sick contacts. She denies chest pain, weakness, dizziness, cough, shortness of breath. She is not taking medicine for the problem. She tolerated water this morning without vomiting.  The history is provided by the patient.    Past Medical History  Diagnosis Date  . HYPERLIPIDEMIA 11/11/2006  . Obesity, unspecified 03/31/2007  . LEARNING DISABILITY 11/11/2006  . MIGRAINE HEADACHE 11/11/2006  . HYPERTENSION 10/15/2009  . ALLERGIC RHINITIS 08/30/2007  . OSTEOARTHRITIS 11/11/2006  . Osteoarthritis   . History of appendectomy     40 yrs ago    Past Surgical History  Procedure Date  . Abdominal hysterectomy   . Tonsillectomy   . Knee surgery   . Appendectomy     40 years ago -1964     Family History  Problem Relation Age of Onset  . Heart disease Father 29  . Hypertension Father   . Cancer Sister     skin CA, 2 sisters    History  Substance Use Topics  . Smoking status: Former Games developer  . Smokeless tobacco: Not on file  . Alcohol Use: No    OB History    Grav Para Term Preterm Abortions TAB SAB Ect Mult Living                  Review of Systems  All other systems reviewed and are negative.    Allergies  Erythromycin; Nsaids; and Penicillins  Home Medications   Current Outpatient Rx  Name Route Sig  Dispense Refill  . ASPIRIN EC 81 MG PO TBEC Oral Take 81 mg by mouth daily.    Marland Kitchen BETAMETHASONE DIPROPIONATE 0.05 % EX LOTN Topical Apply topically 1 day or 1 dose. 180 mL 3    PATIENT WANTS THE SCALP LOTION NOT THE CREAM.  SHE .Marland Kitchen.  . CITALOPRAM HYDROBROMIDE 20 MG PO TABS Oral Take 1 tablet (20 mg total) by mouth daily. 100 tablet 3  . GLUCOSAMINE-CHONDROITIN 500-400 MG PO TABS Oral Take 1 tablet by mouth daily.    Marland Kitchen HYDROCODONE-ACETAMINOPHEN 7.5-750 MG PO TABS  One p.o. B.i.d. P.r.n. 60 tablet 5  . LISINOPRIL-HYDROCHLOROTHIAZIDE 10-12.5 MG PO TABS Oral Take 1 tablet by mouth daily. 100 tablet 3  . METHYLPHENIDATE HCL 10 MG PO TABS  2 tabs twice daily 120 tablet 0  . MULTI-VITAMIN/MINERALS PO TABS Oral Take 1 tablet by mouth daily.    Marland Kitchen OVER THE COUNTER MEDICATION Oral Take 1 tablet by mouth daily. For hair, nails and skin    . CIPROFLOXACIN HCL 500 MG PO TABS Oral Take 1 tablet (500 mg total) by mouth every 12 (twelve) hours. 14 tablet 0  . METRONIDAZOLE 500 MG PO TABS Oral Take 1 tablet (500 mg total) by mouth 2 (two) times daily. One po bid x 7 days 14 tablet 0  . METRONIDAZOLE 1 %  EX GEL Topical Apply topically daily. 45 g 11  . SUMATRIPTAN SUCCINATE 100 MG PO TABS Oral Take 100 mg by mouth every 2 (two) hours as needed.        BP 130/62  Pulse 88  Temp(Src) 98.7 F (37.1 C) (Oral)  Resp 18  SpO2 99%  Physical Exam  Nursing note and vitals reviewed. Constitutional: She is oriented to person, place, and time. She appears well-developed and well-nourished.  HENT:  Head: Normocephalic and atraumatic.  Eyes: Conjunctivae and EOM are normal. Pupils are equal, round, and reactive to light.  Neck: Normal range of motion and phonation normal. Neck supple.  Cardiovascular: Normal rate, regular rhythm and intact distal pulses.   Pulmonary/Chest: Effort normal and breath sounds normal. She exhibits no tenderness.  Abdominal: Soft. She exhibits no distension. There is tenderness (Mild  diffuse). There is no rebound and no guarding.  Musculoskeletal: Normal range of motion.  Neurological: She is alert and oriented to person, place, and time. She has normal strength. She exhibits normal muscle tone.  Skin: Skin is warm and dry.  Psychiatric: She has a normal mood and affect. Her behavior is normal. Judgment and thought content normal.    ED Course  Procedures (including critical care time) Emergency department treatment: IV fluids, IV, Dilaudid, and Zofran. CT ordered to evaluate for diverticulitis.  The case was discussed with her on-call GI doctor. Dr. Leone Payor, recommend oral antibiotics, and followup with her PCP.  14:30 . The patient remains comfortable. Repeat vitals are reassuring. She's had several stools in the ED. The stool sample sent for C. difficile toxin testing, and routine stool culture.      Labs Reviewed  URINALYSIS, ROUTINE W REFLEX MICROSCOPIC - Abnormal; Notable for the following:    Color, Urine AMBER (*) BIOCHEMICALS MAY BE AFFECTED BY COLOR   APPearance CLOUDY (*)    Bilirubin Urine MODERATE (*)    Ketones, ur >80 (*)    Protein, ur 30 (*)    All other components within normal limits  DIFFERENTIAL - Abnormal; Notable for the following:    Neutrophils Relative 81 (*)    Neutro Abs 7.9 (*)    Lymphocytes Relative 10 (*)    All other components within normal limits  COMPREHENSIVE METABOLIC PANEL - Abnormal; Notable for the following:    Sodium 134 (*)    Potassium 3.1 (*)    Glucose, Bld 100 (*)    All other components within normal limits  LIPASE, BLOOD - Abnormal; Notable for the following:    Lipase 9 (*)    All other components within normal limits  CBC  LACTIC ACID, PLASMA  URINE MICROSCOPIC-ADD ON  CLOSTRIDIUM DIFFICILE CULTURE-FECAL  STOOL CULTURE   Ct Abdomen Pelvis W Contrast  07/04/2011  *RADIOLOGY REPORT*  Clinical Data: Abdominal pain.  Diarrhea.  Diverticulosis.  The patient is generally feeling ill.  CT ABDOMEN AND  PELVIS WITH CONTRAST  Technique:  Multidetector CT imaging of the abdomen and pelvis was performed following the standard protocol during bolus administration of intravenous contrast.  Contrast: 40mL OMNIPAQUE IOHEXOL 300 MG/ML  SOLN, OMNIPAQUE IOHEXOL 300 MG/ML  SOLN  Comparison: None.  Findings: The lung bases are clear without focal nodule, mass, or airspace disease.  The heart size is normal.  No significant pleural or pericardial effusion is present.  The liver and spleen are within normal limits.  The stomach, duodenum, and pancreas are within normal limits.  The common bile duct and gallbladder are  normal.  The adrenal glands are normal bilaterally.  The kidneys are unremarkable.  A parapelvic cyst is noted in the left kidney, an incidental finding.  Minimal atherosclerotic calcifications are present in the aorta.  There is no significant adenopathy or free fluid.  Diverticular changes are present in the sigmoid colon.  No focal inflammation is evident to suggest diverticulitis.  There is diffuse wall thickening within the descending, transverse, and ascending colon.  The appendix is surgically absent.  The small bowel is unremarkable.  The urinary bladder is within normal limits.  The patient is status post hysterectomy.  The adnexa are within normal limits for age.  The bone windows demonstrate a vacuum disc at L5-S1.  Degenerative endplate changes are noted in the lower thoracic spine.  IMPRESSION:  1.  Diffuse wall thickening throughout the colon is compatible with a nonspecific colitis.  This could be infectious or ischemic. 2.  The sigmoid colon does not exhibit wall thickening. 3.  Sigmoid diverticulosis without evidence for diverticulitis. 4.  Mild degenerative changes in the lower lumbar spine. 5.  No other acute or focal abnormality to explain the patient's symptoms.  Original Report Authenticated By: Jamesetta Orleans. MATTERN, M.D.     1. Colitis       MDM  Nonspecific colitis. Doubt  C. difficile enterocolitis, ischemic colitis, diverticulitis, metabolic instability, and impending vascular collapse. Patient stable for discharge with outpatient management.    Plan: Home Medications- Cipro, Flagyl; Home Treatments- low fiber diet; Recommended follow up- PCP 1 week    Flint Melter, MD 07/04/11 1555

## 2011-07-04 NOTE — ED Notes (Signed)
Patient finished drinking CT water/contrast; CT notified.

## 2011-07-06 ENCOUNTER — Encounter: Payer: Self-pay | Admitting: Family Medicine

## 2011-07-06 ENCOUNTER — Ambulatory Visit (INDEPENDENT_AMBULATORY_CARE_PROVIDER_SITE_OTHER): Payer: Medicare Other | Admitting: Family Medicine

## 2011-07-06 ENCOUNTER — Telehealth: Payer: Self-pay | Admitting: Family Medicine

## 2011-07-06 VITALS — BP 130/86 | Temp 98.0°F | Wt 213.0 lb

## 2011-07-06 DIAGNOSIS — R197 Diarrhea, unspecified: Secondary | ICD-10-CM

## 2011-07-06 NOTE — Telephone Encounter (Signed)
Appt made.  Pt aware.

## 2011-07-06 NOTE — Telephone Encounter (Signed)
Pt needs to be worked in for an ER Follow up today. Please advise

## 2011-07-06 NOTE — Telephone Encounter (Signed)
Okay to work in at the end of the day 

## 2011-07-06 NOTE — Patient Instructions (Signed)
Soft diet for one week return when necessary stop the Cipro and Flagyl

## 2011-07-06 NOTE — Telephone Encounter (Signed)
[address] [place], [place] [address] p. [phone] f. [phone] To: [facility] Fax: [phone] From: [facility] Date/ Time: 07/04/2011 2:16 PM Taken By: Alphonzo Lemmings, CSR Caller: Johnny Bridge Facility: not collected Patient: Tonya Bartlett DOB: 09/09/1929 Phone: [phone] Reason for Call: [facility] Bridge is calling with results for X-Ray ordered by Madelin Headings.. The results are chest and were read by [facility]. Regarding Appointment: Appt Date: Appt Time: Unknown Provider: Madelin Headings Reason: Details: Outcome:

## 2011-07-06 NOTE — Progress Notes (Signed)
  Subjective:    Patient ID: Tonya Bartlett, female    DOB: Apr 24, 1945, 66 y.o.   MRN: [id number]  HPI Tonya Bartlett is a 66 year old female who comes in today for followup of diarrhea  Last Tuesday she ate at a covered dished dinner the next day she didn't feel well the following day she began having cramps diarrhea and threw up once. She went to the emergency room on Saturday night CT scan of her abdomen was normal except for diffuse thickening of the colon. She had diverticuli however they were not inflamed. She was empirically given Cipro and Flagyl. She's taken that since Saturday and feels better.   Review of Systems GI review of systems otherwise negative well-developed well-nourished female in no acute distress    Objective:   Physical Exam Well-developed well-nourished female in no acute distress examination the abdomen had been soft the bowel sounds are normal there is some tenderness in the right lower quadrant but no masses no rebound       Assessment & Plan:  I suspect she probably had a toxic colitis do not think it's ischemic therefore would stop the Flagyl and Cipro soft diet for one week return when necessary

## 2011-07-08 LAB — STOOL CULTURE

## 2011-07-12 LAB — CLOSTRIDIUM DIFFICILE CULTURE-FECAL

## 2011-09-14 ENCOUNTER — Telehealth: Payer: Self-pay | Admitting: Family Medicine

## 2011-09-14 NOTE — Telephone Encounter (Signed)
Pt called req script for methylphenidate (RITALIN) 10 MG tablet #120

## 2011-09-17 MED ORDER — METHYLPHENIDATE HCL 10 MG PO TABS: ORAL_TABLET | ORAL | Status: DC

## 2011-09-17 NOTE — Telephone Encounter (Signed)
Rx ready for pick up and patient is aware 

## 2011-09-18 ENCOUNTER — Telehealth: Payer: Self-pay | Admitting: *Deleted

## 2011-09-18 ENCOUNTER — Other Ambulatory Visit: Payer: Self-pay | Admitting: *Deleted

## 2011-09-18 DIAGNOSIS — J309 Allergic rhinitis, unspecified: Secondary | ICD-10-CM

## 2011-09-18 DIAGNOSIS — M1990 Unspecified osteoarthritis, unspecified site: Secondary | ICD-10-CM

## 2011-09-18 DIAGNOSIS — E785 Hyperlipidemia, unspecified: Secondary | ICD-10-CM

## 2011-09-18 DIAGNOSIS — N3000 Acute cystitis without hematuria: Secondary | ICD-10-CM

## 2011-09-18 DIAGNOSIS — E669 Obesity, unspecified: Secondary | ICD-10-CM

## 2011-09-18 DIAGNOSIS — I10 Essential (primary) hypertension: Secondary | ICD-10-CM

## 2011-09-18 DIAGNOSIS — G43909 Migraine, unspecified, not intractable, without status migrainosus: Secondary | ICD-10-CM

## 2011-09-18 MED ORDER — METHYLPHENIDATE HCL 10 MG PO TABS: ORAL_TABLET | ORAL | Status: DC

## 2011-09-18 MED ORDER — HYDROCODONE-ACETAMINOPHEN 7.5-750 MG PO TABS: ORAL_TABLET | ORAL | Status: DC

## 2011-09-18 NOTE — Telephone Encounter (Signed)
Ritalin refill

## 2011-11-03 ENCOUNTER — Other Ambulatory Visit: Payer: Self-pay | Admitting: Family Medicine

## 2011-11-17 ENCOUNTER — Ambulatory Visit (INDEPENDENT_AMBULATORY_CARE_PROVIDER_SITE_OTHER): Payer: Medicare Other | Admitting: Family Medicine

## 2011-11-17 ENCOUNTER — Encounter: Payer: Self-pay | Admitting: Family Medicine

## 2011-11-17 VITALS — BP 120/84 | Temp 98.2°F | Ht 65.75 in | Wt 204.0 lb

## 2011-11-17 DIAGNOSIS — R197 Diarrhea, unspecified: Secondary | ICD-10-CM

## 2011-11-17 DIAGNOSIS — N760 Acute vaginitis: Secondary | ICD-10-CM

## 2011-11-17 DIAGNOSIS — M1990 Unspecified osteoarthritis, unspecified site: Secondary | ICD-10-CM

## 2011-11-17 DIAGNOSIS — G43909 Migraine, unspecified, not intractable, without status migrainosus: Secondary | ICD-10-CM

## 2011-11-17 DIAGNOSIS — G5600 Carpal tunnel syndrome, unspecified upper limb: Secondary | ICD-10-CM

## 2011-11-17 DIAGNOSIS — E669 Obesity, unspecified: Secondary | ICD-10-CM

## 2011-11-17 DIAGNOSIS — Z23 Encounter for immunization: Secondary | ICD-10-CM

## 2011-11-17 DIAGNOSIS — I10 Essential (primary) hypertension: Secondary | ICD-10-CM

## 2011-11-17 DIAGNOSIS — E785 Hyperlipidemia, unspecified: Secondary | ICD-10-CM

## 2011-11-17 DIAGNOSIS — F988 Other specified behavioral and emotional disorders with onset usually occurring in childhood and adolescence: Secondary | ICD-10-CM

## 2011-11-17 DIAGNOSIS — Z0000 Encounter for general adult medical examination without abnormal findings: Secondary | ICD-10-CM

## 2011-11-17 LAB — BASIC METABOLIC PANEL
BUN: 9 mg/dL (ref 6–23)
Chloride: 103 mEq/L (ref 96–112)
Creatinine, Ser: 0.5 mg/dL (ref 0.4–1.2)
Glucose, Bld: 89 mg/dL (ref 70–99)

## 2011-11-17 LAB — CBC WITH DIFFERENTIAL/PLATELET
Basophils Absolute: 0.1 10*3/uL (ref 0.0–0.1)
HCT: 37.1 % (ref 36.0–46.0)
Lymphs Abs: 0.9 10*3/uL (ref 0.7–4.0)
MCV: 98.1 fl (ref 78.0–100.0)
Monocytes Absolute: 0.4 10*3/uL (ref 0.1–1.0)
Neutro Abs: 3.9 10*3/uL (ref 1.4–7.7)
Platelets: 307 10*3/uL (ref 150.0–400.0)
RDW: 13 % (ref 11.5–14.6)

## 2011-11-17 LAB — POCT URINALYSIS DIPSTICK
Blood, UA: NEGATIVE
Ketones, UA: NEGATIVE
Protein, UA: NEGATIVE
pH, UA: 7.5

## 2011-11-17 MED ORDER — METHYLPHENIDATE HCL 20 MG PO TABS: 20.0000 mg | ORAL_TABLET | Freq: Two times a day (BID) | ORAL | Status: DC

## 2011-11-17 MED ORDER — ESTROGENS, CONJUGATED 0.625 MG/GM VA CREA: TOPICAL_CREAM | Freq: Every day | VAGINAL | Status: DC

## 2011-11-17 MED ORDER — CITALOPRAM HYDROBROMIDE 20 MG PO TABS: 20.0000 mg | ORAL_TABLET | Freq: Every day | ORAL | Status: DC

## 2011-11-17 NOTE — Progress Notes (Signed)
  Subjective:    Patient ID: Tonya Bartlett, female    DOB: October 07, 1945, 66 y.o.   MRN: [id number]  HPI Tonya Bartlett  is a 66 year old widowed female who continues to work in psychiatric counseling who comes in today for a Medicare wellness examination  She has a history of mild depression she takes Celexa 20 mg daily  She has a history of adult ADD she takes Ritalin 20 mg twice a day  She has a history of migraine headaches for which she takes Imitrex however she's not had a migraine headache in the past 12 months  She had her uterus removed many years ago for nonmalignant reasons pelvic examination no longer indicated  She continues to have loose bowel movements which she describes as 3-4 loose bowel movements a day she's tried changing her diet but it still persists. Showed a colonoscopy in 2008 by Dr. dB which was normal. I will refer her back for reevaluation  Tetanus booster 2008, Pneumovax 2011, shingles information given.  She gets routine eye care, dental care, and you mammography, colonoscopy and GI as noted above, tetanus 2008, Pneumovax 2011.   Review of Systems  Constitutional: Negative.   HENT: Negative.   Eyes: Negative.   Respiratory: Negative.   Cardiovascular: Negative.   Gastrointestinal: Negative.   Genitourinary: Negative.   Musculoskeletal: Negative.   Neurological: Negative.   Hematological: Negative.   Psychiatric/Behavioral: Negative.        Objective:   Physical Exam  Constitutional: She appears well-developed and well-nourished.  HENT:  Head: Normocephalic and atraumatic.  Right Ear: External ear normal.  Left Ear: External ear normal.  Nose: Nose normal.  Mouth/Throat: Oropharynx is clear and moist.  Eyes: EOM are normal. Pupils are equal, round, and reactive to light.  Neck: Normal range of motion. Neck supple. No thyromegaly present.  Cardiovascular: Normal rate, regular rhythm, normal heart sounds and intact distal pulses.  Exam reveals no gallop  and no friction rub.   No murmur heard. Pulmonary/Chest: Effort normal and breath sounds normal.  Abdominal: Soft. Bowel sounds are normal. She exhibits no distension and no mass. There is no tenderness. There is no rebound.  Genitourinary:       Bilateral breast exam normal encouraged to BSE monthly  Musculoskeletal: Normal range of motion.  Lymphadenopathy:    She has no cervical adenopathy.  Neurological: She is alert. She has normal reflexes. No cranial nerve deficit. She exhibits normal muscle tone. Coordination normal.  Skin: Skin is warm and dry.       Total body skin exam normal she does have numerous seborrheic keratosis.  Psychiatric: She has a normal mood and affect. Her behavior is normal. Judgment and thought content normal.          Assessment & Plan:  Healthy female  History of mild depression continue Celexa 20 mg daily  History of adult ADD Ritalin 20 mg twice a day  Migraine headaches asymptomatic  Postmenopausal vaginal dryness Premarin vaginal cream twice weekly  Encouraged the shingles vaccine

## 2011-11-17 NOTE — Patient Instructions (Signed)
Continue the Ritalin 20 mg twice daily  Continue the Celexa one daily at bedtime  You small amounts of hormonal cream twice weekly  6 followup in 1 year sooner if any problems  6 call the GI division ,,,,,, make an appointment to see Dr. Dickie La  Walk 30 minutes daily

## 2011-12-31 ENCOUNTER — Other Ambulatory Visit: Payer: Self-pay | Admitting: *Deleted

## 2011-12-31 MED ORDER — HYDROCODONE-ACETAMINOPHEN 7.5-325 MG PO TABS: 1.0000 | ORAL_TABLET | Freq: Four times a day (QID) | ORAL | Status: DC | PRN

## 2011-12-31 NOTE — Telephone Encounter (Signed)
New hydrocodone rx called in

## 2012-01-01 ENCOUNTER — Other Ambulatory Visit: Payer: Self-pay | Admitting: Family Medicine

## 2012-02-23 ENCOUNTER — Telehealth: Payer: Self-pay | Admitting: Family Medicine

## 2012-02-23 DIAGNOSIS — F988 Other specified behavioral and emotional disorders with onset usually occurring in childhood and adolescence: Secondary | ICD-10-CM

## 2012-02-23 MED ORDER — METHYLPHENIDATE HCL 20 MG PO TABS
20.0000 mg | ORAL_TABLET | Freq: Two times a day (BID) | ORAL | Status: DC
Start: 2012-02-23 — End: 2012-05-09

## 2012-02-23 MED ORDER — METHYLPHENIDATE HCL 20 MG PO TABS: 20.0000 mg | ORAL_TABLET | Freq: Two times a day (BID) | ORAL | Status: DC

## 2012-02-23 NOTE — Telephone Encounter (Signed)
Left message on machine for patient that Rx is ready for pick up.  Also informed patient to call [facility] per Dr Tawanna Cooler

## 2012-02-23 NOTE — Telephone Encounter (Signed)
Pt needs refill of methylphenidate (RITALIN) 20 MG tablet  Pt has burning and frequent urination. Pt discussed w/ MD, tried Premarin. It did not work. Would like referral to urologist. This problem has been going on 2 + months.

## 2012-02-27 ENCOUNTER — Other Ambulatory Visit: Payer: Self-pay | Admitting: Family Medicine

## 2012-05-06 ENCOUNTER — Telehealth: Payer: Self-pay | Admitting: Family Medicine

## 2012-05-06 DIAGNOSIS — F988 Other specified behavioral and emotional disorders with onset usually occurring in childhood and adolescence: Secondary | ICD-10-CM

## 2012-05-06 NOTE — Telephone Encounter (Signed)
Pt needs new rx generic ritalin 20 mg °

## 2012-05-09 MED ORDER — METHYLPHENIDATE HCL 20 MG PO TABS: 20.0000 mg | ORAL_TABLET | Freq: Two times a day (BID) | ORAL | Status: DC

## 2012-05-09 NOTE — Telephone Encounter (Signed)
Rx ready for pick up and patient is aware 

## 2012-05-31 ENCOUNTER — Encounter: Payer: Self-pay | Admitting: Family Medicine

## 2012-05-31 ENCOUNTER — Ambulatory Visit (INDEPENDENT_AMBULATORY_CARE_PROVIDER_SITE_OTHER): Payer: Medicare Other | Admitting: Family Medicine

## 2012-05-31 VITALS — BP 110/80 | Temp 98.8°F | Wt 206.0 lb

## 2012-05-31 DIAGNOSIS — W57XXXA Bitten or stung by nonvenomous insect and other nonvenomous arthropods, initial encounter: Secondary | ICD-10-CM

## 2012-05-31 DIAGNOSIS — T148 Other injury of unspecified body region: Secondary | ICD-10-CM

## 2012-05-31 MED ORDER — DOXYCYCLINE HYCLATE 100 MG PO TABS: 100.0000 mg | ORAL_TABLET | Freq: Two times a day (BID) | ORAL | Status: DC

## 2012-05-31 NOTE — Patient Instructions (Addendum)
Within the next 2 weeks if you start running fever begin the doxycycline immediately and call me

## 2012-05-31 NOTE — Progress Notes (Signed)
  Subjective:    Patient ID: Tonya Bartlett, female    DOB: [birth year], 67 y.o.   MRN: [id number]  HPI  Tonya Bartlett is a 67 year old female nonsmoker who comes in today for evaluation of multiple tick bites  She states she's had and he takes last night and removed about 7 for her abdomen and legs.  Review of Systems    review of systems otherwise negative Objective:   Physical Exam  Well-developed and nourished female no acute distress she has 7+ take bites abdomen and legs      Assessment & Plan:  Take bites multiple plan ..........Marland Kitchen Doxycycline twice a day when necessary if she starts running fever

## 2012-06-16 ENCOUNTER — Encounter: Payer: Self-pay | Admitting: Family Medicine

## 2012-06-16 ENCOUNTER — Ambulatory Visit (INDEPENDENT_AMBULATORY_CARE_PROVIDER_SITE_OTHER): Payer: Medicare Other | Admitting: Family Medicine

## 2012-06-16 VITALS — BP 124/84 | Temp 98.5°F | Wt 203.0 lb

## 2012-06-16 DIAGNOSIS — L247 Irritant contact dermatitis due to plants, except food: Secondary | ICD-10-CM

## 2012-06-16 DIAGNOSIS — F988 Other specified behavioral and emotional disorders with onset usually occurring in childhood and adolescence: Secondary | ICD-10-CM

## 2012-06-16 DIAGNOSIS — L255 Unspecified contact dermatitis due to plants, except food: Secondary | ICD-10-CM

## 2012-06-16 DIAGNOSIS — W57XXXA Bitten or stung by nonvenomous insect and other nonvenomous arthropods, initial encounter: Secondary | ICD-10-CM

## 2012-06-16 MED ORDER — PREDNISONE 20 MG PO TABS: ORAL_TABLET | ORAL | Status: DC

## 2012-06-16 MED ORDER — METHYLPHENIDATE HCL 20 MG PO TABS: 20.0000 mg | ORAL_TABLET | Freq: Two times a day (BID) | ORAL | Status: DC

## 2012-06-16 NOTE — Patient Instructions (Signed)
Prednisone 20 mg......... 2 tabs daily until the rash abates and then taper  Return when necessary

## 2012-06-16 NOTE — Progress Notes (Signed)
  Subjective:    Patient ID: Tonya Bartlett, female    DOB: [birth year], 67 y.o.   MRN: [id number]  HPI Alec is a 67 year old female who comes in today for evaluation of a rash  She had a multiple tick bites. No fever the bites have walled off informed little knots  Then she developed a red papule all over her chest and arms very puritic    Review of Systems    review of systems negative Objective:   Physical Exam  Well-developed well-nourished female no acute distress examination shows multiple small tick bites that of walled itself off  She also has a multiple red bumps on her chest and arms. The red small papules      Assessment & Plan:  Tick bites observe for [facility] spotted fever  Contact dermatitis or itdreaction prednisone burst and taper

## 2012-07-29 ENCOUNTER — Other Ambulatory Visit: Payer: Self-pay | Admitting: Family Medicine

## 2012-11-17 ENCOUNTER — Other Ambulatory Visit: Payer: Self-pay | Admitting: Family Medicine

## 2012-11-17 ENCOUNTER — Telehealth: Payer: Self-pay | Admitting: Family Medicine

## 2012-11-17 DIAGNOSIS — F988 Other specified behavioral and emotional disorders with onset usually occurring in childhood and adolescence: Secondary | ICD-10-CM

## 2012-11-17 MED ORDER — METHYLPHENIDATE HCL 20 MG PO TABS: 20.0000 mg | ORAL_TABLET | Freq: Two times a day (BID) | ORAL | Status: DC

## 2012-11-17 NOTE — Telephone Encounter (Signed)
Rx ready for pick up and patient is aware 

## 2012-11-17 NOTE — Telephone Encounter (Signed)
Pt request refill of methylphenidate (RITALIN) 20 MG tablet 1/bid  90 day supply

## 2012-12-22 ENCOUNTER — Telehealth: Payer: Self-pay | Admitting: Family Medicine

## 2012-12-22 MED ORDER — HYDROCODONE-ACETAMINOPHEN 7.5-325 MG PO TABS: ORAL_TABLET | ORAL | Status: DC

## 2012-12-22 NOTE — Telephone Encounter (Signed)
Pt needs new rx hydrocodone °

## 2012-12-22 NOTE — Telephone Encounter (Signed)
Patient should come in for a urine drug screening.  Rx ready for pick up

## 2012-12-22 NOTE — Telephone Encounter (Signed)
Patient is aware and Rx ready

## 2013-01-26 ENCOUNTER — Telehealth: Payer: Self-pay | Admitting: Family Medicine

## 2013-01-26 DIAGNOSIS — F988 Other specified behavioral and emotional disorders with onset usually occurring in childhood and adolescence: Secondary | ICD-10-CM

## 2013-01-26 MED ORDER — HYDROCODONE-HOMATROPINE 5-1.5 MG/5ML PO SYRP: ORAL_SOLUTION | ORAL | Status: DC

## 2013-01-26 MED ORDER — HYDROCODONE-ACETAMINOPHEN 7.5-325 MG PO TABS: ORAL_TABLET | ORAL | Status: DC

## 2013-01-26 MED ORDER — METHYLPHENIDATE HCL 20 MG PO TABS: 20.0000 mg | ORAL_TABLET | Freq: Two times a day (BID) | ORAL | Status: DC

## 2013-01-26 NOTE — Telephone Encounter (Signed)
Pt has laryngitis and a bad cough, no fever. Pt wouldl ike to see dr todd.

## 2013-01-26 NOTE — Telephone Encounter (Signed)
Rx for Hydromet

## 2013-03-06 ENCOUNTER — Telehealth: Payer: Self-pay | Admitting: Family Medicine

## 2013-03-06 MED ORDER — HYDROCODONE-ACETAMINOPHEN 7.5-325 MG PO TABS: ORAL_TABLET | ORAL | Status: DC

## 2013-03-06 NOTE — Telephone Encounter (Signed)
Rx ready for pick up and patient is aware 

## 2013-03-06 NOTE — Telephone Encounter (Signed)
Pt needs new rx hydrocodone °

## 2013-03-26 ENCOUNTER — Other Ambulatory Visit: Payer: Self-pay | Admitting: Family Medicine

## 2013-04-12 ENCOUNTER — Telehealth: Payer: Self-pay | Admitting: Family Medicine

## 2013-04-12 MED ORDER — HYDROCODONE-ACETAMINOPHEN 7.5-325 MG PO TABS: ORAL_TABLET | ORAL | Status: DC

## 2013-04-12 NOTE — Telephone Encounter (Signed)
Rx ready for pick up and patient is aware 

## 2013-04-12 NOTE — Telephone Encounter (Signed)
Pt is needing new rx for HYDROcodone-acetaminophen (NORCO) 7.5-325 MG per tablet, please call when available for pick up.  ° °

## 2013-04-19 ENCOUNTER — Telehealth: Payer: Self-pay | Admitting: Family Medicine

## 2013-04-19 DIAGNOSIS — F988 Other specified behavioral and emotional disorders with onset usually occurring in childhood and adolescence: Secondary | ICD-10-CM

## 2013-04-19 NOTE — Telephone Encounter (Signed)
Pt needs new rx methylphenidate °

## 2013-04-20 MED ORDER — METHYLPHENIDATE HCL 20 MG PO TABS: 20.0000 mg | ORAL_TABLET | Freq: Two times a day (BID) | ORAL | Status: DC

## 2013-04-20 NOTE — Telephone Encounter (Signed)
Rx ready for pick up. 

## 2013-05-01 ENCOUNTER — Other Ambulatory Visit: Payer: Self-pay | Admitting: Orthopedic Surgery

## 2013-05-12 ENCOUNTER — Encounter (HOSPITAL_BASED_OUTPATIENT_CLINIC_OR_DEPARTMENT_OTHER): Payer: Self-pay | Admitting: *Deleted

## 2013-05-15 ENCOUNTER — Encounter (HOSPITAL_BASED_OUTPATIENT_CLINIC_OR_DEPARTMENT_OTHER): Payer: Self-pay | Admitting: *Deleted

## 2013-05-15 NOTE — Progress Notes (Signed)
NPO AFTER MN WITH EXCEPTION CLEAR LIQUIDS UNTIL 0830 (NO CREAM/ MILK PRODUCTS).  ARRIVE AT 1230. NEEDS ISTAT AND EKG. WILL TAKE NORCO AND CELEXA AM DOS W/ SIPS OF WATER.

## 2013-05-22 ENCOUNTER — Telehealth: Payer: Self-pay

## 2013-05-22 MED ORDER — HYDROCODONE-ACETAMINOPHEN 7.5-325 MG PO TABS: ORAL_TABLET | ORAL | Status: DC

## 2013-05-22 NOTE — Telephone Encounter (Signed)
Left message on machine for patient that Rx ready for pick up. 

## 2013-05-22 NOTE — Telephone Encounter (Signed)
Pt req rx on HYDROcodone-acetaminophen (NORCO) 7.5-325 MG per tablet ° °

## 2013-05-23 ENCOUNTER — Encounter (HOSPITAL_BASED_OUTPATIENT_CLINIC_OR_DEPARTMENT_OTHER)
Admission: RE | Disposition: A | Discharge status: Home / Self Care | Payer: Medicare Other | Source: Ambulatory Visit | Attending: Specialist

## 2013-05-23 ENCOUNTER — Encounter (HOSPITAL_BASED_OUTPATIENT_CLINIC_OR_DEPARTMENT_OTHER): Payer: Medicare Other | Admitting: Anesthesiology

## 2013-05-23 ENCOUNTER — Other Ambulatory Visit: Payer: Self-pay

## 2013-05-23 ENCOUNTER — Ambulatory Visit (HOSPITAL_BASED_OUTPATIENT_CLINIC_OR_DEPARTMENT_OTHER)
Admission: RE | Admit: 2013-05-23 | Discharge: 2013-05-23 | Disposition: A | Discharge status: Home / Self Care | Payer: Medicare Other | Source: Ambulatory Visit | Attending: Specialist | Admitting: Specialist

## 2013-05-23 ENCOUNTER — Ambulatory Visit (HOSPITAL_BASED_OUTPATIENT_CLINIC_OR_DEPARTMENT_OTHER): Payer: Medicare Other | Admitting: Anesthesiology

## 2013-05-23 ENCOUNTER — Encounter (HOSPITAL_BASED_OUTPATIENT_CLINIC_OR_DEPARTMENT_OTHER): Payer: Self-pay

## 2013-05-23 DIAGNOSIS — Z96659 Presence of unspecified artificial knee joint: Secondary | ICD-10-CM | POA: Insufficient documentation

## 2013-05-23 DIAGNOSIS — Z7982 Long term (current) use of aspirin: Secondary | ICD-10-CM | POA: Insufficient documentation

## 2013-05-23 DIAGNOSIS — M7580 Other shoulder lesions, unspecified shoulder: Secondary | ICD-10-CM

## 2013-05-23 DIAGNOSIS — I10 Essential (primary) hypertension: Secondary | ICD-10-CM | POA: Insufficient documentation

## 2013-05-23 DIAGNOSIS — E785 Hyperlipidemia, unspecified: Secondary | ICD-10-CM | POA: Insufficient documentation

## 2013-05-23 DIAGNOSIS — M2240 Chondromalacia patellae, unspecified knee: Secondary | ICD-10-CM | POA: Insufficient documentation

## 2013-05-23 DIAGNOSIS — M25819 Other specified joint disorders, unspecified shoulder: Secondary | ICD-10-CM | POA: Insufficient documentation

## 2013-05-23 DIAGNOSIS — Z79899 Other long term (current) drug therapy: Secondary | ICD-10-CM | POA: Insufficient documentation

## 2013-05-23 DIAGNOSIS — Z87891 Personal history of nicotine dependence: Secondary | ICD-10-CM | POA: Insufficient documentation

## 2013-05-23 DIAGNOSIS — Z86718 Personal history of other venous thrombosis and embolism: Secondary | ICD-10-CM | POA: Insufficient documentation

## 2013-05-23 DIAGNOSIS — M19019 Primary osteoarthritis, unspecified shoulder: Secondary | ICD-10-CM | POA: Insufficient documentation

## 2013-05-23 DIAGNOSIS — Z90710 Acquired absence of both cervix and uterus: Secondary | ICD-10-CM | POA: Insufficient documentation

## 2013-05-23 DIAGNOSIS — M24119 Other articular cartilage disorders, unspecified shoulder: Secondary | ICD-10-CM | POA: Insufficient documentation

## 2013-05-23 DIAGNOSIS — M67919 Unspecified disorder of synovium and tendon, unspecified shoulder: Secondary | ICD-10-CM | POA: Insufficient documentation

## 2013-05-23 DIAGNOSIS — Z9889 Other specified postprocedural states: Secondary | ICD-10-CM

## 2013-05-23 DIAGNOSIS — M719 Bursopathy, unspecified: Principal | ICD-10-CM | POA: Insufficient documentation

## 2013-05-23 HISTORY — DX: Allergic rhinitis, unspecified: J30.9

## 2013-05-23 HISTORY — DX: Alcohol dependence, in remission: F10.21

## 2013-05-23 HISTORY — DX: Frequency of micturition: R35.0

## 2013-05-23 HISTORY — DX: Personal history of other venous thrombosis and embolism: Z86.718

## 2013-05-23 HISTORY — DX: Unspecified osteoarthritis, unspecified site: M19.90

## 2013-05-23 HISTORY — DX: Personal history of diseases of the blood and blood-forming organs and certain disorders involving the immune mechanism: Z86.2

## 2013-05-23 HISTORY — DX: Hyperlipidemia, unspecified: E78.5

## 2013-05-23 HISTORY — DX: Unspecified rotator cuff tear or rupture of right shoulder, not specified as traumatic: M75.101

## 2013-05-23 HISTORY — PX: SHOULDER ARTHROSCOPY WITH SUBACROMIAL DECOMPRESSION, ROTATOR CUFF REPAIR AND BICEP TENDON REPAIR: SHX5687

## 2013-05-23 HISTORY — DX: Migraine, unspecified, not intractable, without status migrainosus: G43.909

## 2013-05-23 HISTORY — DX: Personal history of other diseases of the nervous system and sense organs: Z86.69

## 2013-05-23 LAB — POCT I-STAT 4, (NA,K, GLUC, HGB,HCT)
GLUCOSE: 97 mg/dL (ref 70–99)
HCT: 36 % (ref 36.0–46.0)
Hemoglobin: 12.2 g/dL (ref 12.0–15.0)
POTASSIUM: 3.7 meq/L (ref 3.7–5.3)
SODIUM: 141 meq/L (ref 137–147)

## 2013-05-23 SURGERY — SHOULDER ARTHROSCOPY WITH SUBACROMIAL DECOMPRESSION, ROTATOR CUFF REPAIR AND BICEP TENDON REPAIR
Anesthesia: General | Site: Shoulder | Laterality: Right

## 2013-05-23 MED ORDER — SODIUM CHLORIDE 0.9 % IJ SOLN
INTRAMUSCULAR | Status: DC | PRN
  Administered 2013-05-23: 10 mL via INTRAVENOUS

## 2013-05-23 MED ORDER — MIDAZOLAM HCL 2 MG/2ML IJ SOLN
2.0000 mg | Freq: Once | INTRAMUSCULAR | Status: AC
  Administered 2013-05-23: 2 mg via INTRAVENOUS
  Filled 2013-05-23: qty 2

## 2013-05-23 MED ORDER — SUCCINYLCHOLINE CHLORIDE 20 MG/ML IJ SOLN
INTRAMUSCULAR | Status: DC | PRN
  Administered 2013-05-23: 120 mg via INTRAVENOUS

## 2013-05-23 MED ORDER — FENTANYL CITRATE 0.05 MG/ML IJ SOLN
INTRAMUSCULAR | Status: DC | PRN
  Administered 2013-05-23 (×4): 50 ug via INTRAVENOUS

## 2013-05-23 MED ORDER — MIDAZOLAM HCL 2 MG/2ML IJ SOLN
INTRAMUSCULAR | Status: AC
  Filled 2013-05-23: qty 2

## 2013-05-23 MED ORDER — FENTANYL CITRATE 0.05 MG/ML IJ SOLN
100.0000 ug | Freq: Once | INTRAMUSCULAR | Status: AC
  Administered 2013-05-23: 50 ug via INTRAVENOUS
  Filled 2013-05-23: qty 2

## 2013-05-23 MED ORDER — TRIAMCINOLONE ACETONIDE 40 MG/ML IJ SUSP
INTRAMUSCULAR | Status: DC | PRN
  Administered 2013-05-23: 80 mg via INTRA_ARTICULAR

## 2013-05-23 MED ORDER — VANCOMYCIN HCL IN DEXTROSE 1-5 GM/200ML-% IV SOLN
1000.0000 mg | INTRAVENOUS | Status: AC
  Administered 2013-05-23: 1000 mg via INTRAVENOUS
  Filled 2013-05-23: qty 200

## 2013-05-23 MED ORDER — LACTATED RINGERS IV SOLN
INTRAVENOUS | Status: DC
  Filled 2013-05-23: qty 1000

## 2013-05-23 MED ORDER — PROPOFOL 10 MG/ML IV BOLUS
INTRAVENOUS | Status: DC | PRN
  Administered 2013-05-23: 150 mg via INTRAVENOUS

## 2013-05-23 MED ORDER — GLYCOPYRROLATE 0.2 MG/ML IJ SOLN
INTRAMUSCULAR | Status: DC | PRN
  Administered 2013-05-23: 0.2 mg via INTRAVENOUS

## 2013-05-23 MED ORDER — LIDOCAINE HCL (CARDIAC) 20 MG/ML IV SOLN
INTRAVENOUS | Status: DC | PRN
  Administered 2013-05-23: 70 mg via INTRAVENOUS

## 2013-05-23 MED ORDER — EPINEPHRINE HCL 1 MG/ML IJ SOLN
INTRAMUSCULAR | Status: DC | PRN
  Administered 2013-05-23: 1 mg via INTRAVENOUS

## 2013-05-23 MED ORDER — FENTANYL CITRATE 0.05 MG/ML IJ SOLN
INTRAMUSCULAR | Status: AC
  Filled 2013-05-23: qty 4

## 2013-05-23 MED ORDER — METHOCARBAMOL 500 MG PO TABS: 500.0000 mg | ORAL_TABLET | Freq: Four times a day (QID) | ORAL | Status: DC | PRN

## 2013-05-23 MED ORDER — ACETAMINOPHEN 10 MG/ML IV SOLN
INTRAVENOUS | Status: DC | PRN
  Administered 2013-05-23: 1000 mg via INTRAVENOUS

## 2013-05-23 MED ORDER — LACTATED RINGERS IV SOLN
INTRAVENOUS | Status: DC
  Administered 2013-05-23 (×2): via INTRAVENOUS
  Filled 2013-05-23: qty 1000

## 2013-05-23 MED ORDER — ROPIVACAINE HCL 5 MG/ML IJ SOLN
INTRAMUSCULAR | Status: DC | PRN
  Administered 2013-05-23: 30 mL via PERINEURAL

## 2013-05-23 MED ORDER — FENTANYL CITRATE 0.05 MG/ML IJ SOLN
25.0000 ug | INTRAMUSCULAR | Status: DC | PRN
  Filled 2013-05-23: qty 1

## 2013-05-23 MED ORDER — BUPIVACAINE HCL 0.25 % IJ SOLN
INTRAMUSCULAR | Status: DC | PRN
  Administered 2013-05-23: 10 mL

## 2013-05-23 MED ORDER — ONDANSETRON HCL 4 MG/2ML IJ SOLN
INTRAMUSCULAR | Status: DC | PRN
Start: 2013-05-23 — End: 2013-05-23
  Administered 2013-05-23: 4 mg via INTRAVENOUS

## 2013-05-23 MED ORDER — CHLORHEXIDINE GLUCONATE 4 % EX LIQD
60.0000 mL | Freq: Once | CUTANEOUS | Status: DC
  Filled 2013-05-23: qty 60

## 2013-05-23 MED ORDER — OXYCODONE-ACETAMINOPHEN 5-325 MG PO TABS: 1.0000 | ORAL_TABLET | Freq: Four times a day (QID) | ORAL | Status: DC | PRN

## 2013-05-23 MED ORDER — DOXYCYCLINE HYCLATE 50 MG PO CAPS: 100.0000 mg | ORAL_CAPSULE | Freq: Two times a day (BID) | ORAL | Status: DC

## 2013-05-23 MED ORDER — SODIUM CHLORIDE 0.9 % IV SOLN
INTRAVENOUS | Status: DC
Start: 2013-05-23 — End: 2013-05-23
  Filled 2013-05-23: qty 1000

## 2013-05-23 MED ORDER — FENTANYL CITRATE 0.05 MG/ML IJ SOLN
INTRAMUSCULAR | Status: AC
  Filled 2013-05-23: qty 2

## 2013-05-23 MED ORDER — SODIUM CHLORIDE 0.9 % IR SOLN
Status: DC | PRN
  Administered 2013-05-23: 12000 mL

## 2013-05-23 MED ORDER — EPHEDRINE SULFATE 50 MG/ML IJ SOLN
INTRAMUSCULAR | Status: DC | PRN
  Administered 2013-05-23: 10 mg via INTRAVENOUS

## 2013-05-23 SURGICAL SUPPLY — 79 items
BLADE CUDA 4.2 (BLADE) IMPLANT
BLADE CUDA GRT WHITE 3.5 (BLADE) ×2 IMPLANT
BLADE CUTTER GATOR 3.5 (BLADE) IMPLANT
BLADE GREAT WHITE 4.2 (BLADE) ×2 IMPLANT
BLADE SURG 11 STRL SS (BLADE) ×2 IMPLANT
BLADE SURG 15 STRL LF DISP TIS (BLADE) ×1 IMPLANT
BLADE SURG 15 STRL SS (BLADE) ×2
BUR 3.5 LG SPHERICAL (BURR) IMPLANT
BUR OVAL 6.0 (BURR) ×2 IMPLANT
BURR 3.5 LG SPHERICAL (BURR)
CANISTER SUCT LVC 12 LTR MEDI- (MISCELLANEOUS) ×6 IMPLANT
CANISTER SUCTION 2500CC (MISCELLANEOUS) IMPLANT
CANNULA 5.75X7 CRYSTAL CLEAR (CANNULA) ×2 IMPLANT
CANNULA 5.75X71 LONG (CANNULA) IMPLANT
CANNULA TWIST IN 8.25X7CM (CANNULA) ×4 IMPLANT
CLOTH BEACON ORANGE TIMEOUT ST (SAFETY) ×2 IMPLANT
COVER MAYO STAND STRL (DRAPES) ×2 IMPLANT
COVER TABLE BACK 60X90 (DRAPES) ×2 IMPLANT
DRAPE LG THREE QUARTER DISP (DRAPES) ×2 IMPLANT
DRAPE ORTHO SPLIT 77X108 STRL (DRAPES) ×4
DRAPE POUCH INSTRU U-SHP 10X18 (DRAPES) ×2 IMPLANT
DRAPE STERI 35X30 U-POUCH (DRAPES) ×2 IMPLANT
DRAPE SURG 17X23 STRL (DRAPES) ×2 IMPLANT
DRAPE SURG ORHT 6 SPLT 77X108 (DRAPES) ×2 IMPLANT
DRAPE U-SHAPE 47X51 STRL (DRAPES) ×2 IMPLANT
DRSG PAD ABDOMINAL 8X10 ST (GAUZE/BANDAGES/DRESSINGS) ×2 IMPLANT
DURAPREP 26ML APPLICATOR (WOUND CARE) ×2 IMPLANT
ELECT MENISCUS 165MM 90D (ELECTRODE) IMPLANT
ELECT REM PT RETURN 9FT ADLT (ELECTROSURGICAL) ×2
ELECTRODE REM PT RTRN 9FT ADLT (ELECTROSURGICAL) ×1 IMPLANT
FIBERSTICK 2 (SUTURE) IMPLANT
GAUZE XEROFORM 1X8 LF (GAUZE/BANDAGES/DRESSINGS) ×2 IMPLANT
GLOVE BIO SURGEON STRL SZ7.5 (GLOVE) ×2 IMPLANT
GLOVE BIOGEL M 6.5 STRL (GLOVE) ×2 IMPLANT
GLOVE BIOGEL PI IND STRL 6.5 (GLOVE) ×1 IMPLANT
GLOVE BIOGEL PI INDICATOR 6.5 (GLOVE) ×1
GLOVE INDICATOR 8.0 STRL GRN (GLOVE) ×4 IMPLANT
GLOVE SURG ORTHO 8.0 STRL STRW (GLOVE) ×2 IMPLANT
GOWN PREVENTION PLUS LG XLONG (DISPOSABLE) ×2 IMPLANT
GOWN STRL REIN XL XLG (GOWN DISPOSABLE) IMPLANT
GOWN STRL REUS W/TWL LRG LVL3 (GOWN DISPOSABLE) ×2 IMPLANT
GOWN STRL REUS W/TWL XL LVL3 (GOWN DISPOSABLE) ×4 IMPLANT
IV NS IRRIG 3000ML ARTHROMATIC (IV SOLUTION) ×8 IMPLANT
KIT SHOULDER TRACTION (DRAPES) ×2 IMPLANT
LASSO SUT 90 DEGREE (SUTURE) IMPLANT
NEEDLE 1/2 CIR CATGUT .05X1.09 (NEEDLE) IMPLANT
NEEDLE HYPO 22GX1.5 SAFETY (NEEDLE) ×2 IMPLANT
NEEDLE SPNL 18GX3.5 QUINCKE PK (NEEDLE) ×2 IMPLANT
NS IRRIG 500ML POUR BTL (IV SOLUTION) IMPLANT
PACK BASIN DAY SURGERY FS (CUSTOM PROCEDURE TRAY) ×2 IMPLANT
PAD ABD 8X10 STRL (GAUZE/BANDAGES/DRESSINGS) ×4 IMPLANT
PENCIL BUTTON HOLSTER BLD 10FT (ELECTRODE) IMPLANT
SET ARTHROSCOPY TUBING (MISCELLANEOUS) ×1
SET ARTHROSCOPY TUBING PVC (MISCELLANEOUS) ×1 IMPLANT
SLING ULTRA II AB L (ORTHOPEDIC SUPPLIES) IMPLANT
SLING ULTRA II AB S (ORTHOPEDIC SUPPLIES) IMPLANT
SLING ULTRA II MEDIUM (SOFTGOODS) ×2 IMPLANT
SLING ULTRA III MED (ORTHOPEDIC SUPPLIES) ×2 IMPLANT
SPONGE GAUZE 4X4 12PLY (GAUZE/BANDAGES/DRESSINGS) ×2 IMPLANT
SPONGE GAUZE 4X4 12PLY STER LF (GAUZE/BANDAGES/DRESSINGS) ×2 IMPLANT
SPONGE LAP 4X18 X RAY DECT (DISPOSABLE) IMPLANT
SUCTION FRAZIER TIP 10 FR DISP (SUCTIONS) IMPLANT
SUT ETHILON 3 0 PS 1 (SUTURE) ×2 IMPLANT
SUT LASSO 45 DEGREE LEFT (SUTURE) IMPLANT
SUT LASSO 45D RIGHT (SUTURE) IMPLANT
SUT PDS AB 0 CT1 27 (SUTURE) IMPLANT
SUT VIC AB 0 CT1 36 (SUTURE) IMPLANT
SUT VIC AB 2-0 CT1 27 (SUTURE)
SUT VIC AB 2-0 CT1 TAPERPNT 27 (SUTURE) IMPLANT
SYR 20CC LL (SYRINGE) ×2 IMPLANT
SYR CONTROL 10ML LL (SYRINGE) IMPLANT
SYR TB 1ML 27GX1/2 SAFE (SYRINGE) ×1 IMPLANT
SYR TB 1ML 27GX1/2 SAFETY (SYRINGE) ×2
TAPE CLOTH SURG 6X10 WHT LF (GAUZE/BANDAGES/DRESSINGS) ×2 IMPLANT
TOWEL OR 17X24 6PK STRL BLUE (TOWEL DISPOSABLE) ×4 IMPLANT
TUBE CONNECTING 12X1/4 (SUCTIONS) ×4 IMPLANT
WAND 90 DEG TURBOVAC W/CORD (SURGICAL WAND) ×2 IMPLANT
WATER STERILE IRR 500ML POUR (IV SOLUTION) ×2 IMPLANT
YANKAUER SUCT BULB TIP NO VENT (SUCTIONS) IMPLANT

## 2013-05-23 NOTE — Op Note (Signed)
Dictated [id number]

## 2013-05-23 NOTE — Addendum Note (Signed)
Addendum created 05/23/13 1047 by Mechele Claude, CRNA   Modules edited: Anesthesia Medication Administration

## 2013-05-23 NOTE — H&P (Signed)
Tonya Bartlett is an 68 y.o. female.   Chief Complaint: rigth shoulder pain HPI: Patient presents with joint discomfort that had been persistent for several months now. Despite conservative treatments, his discomfort has not improved. Imaging was obtained. Other conservative and surgical treatments were discussed in detail. Patient wishes to proceed with surgery as consented. Denies SOB, CP, or calf pain. No Fever, chills, or nausea/ vomiting.   Past Medical History  Diagnosis Date  . OA (osteoarthritis)     RIGHT SHOULDER  . Right rotator cuff tear   . Hyperlipidemia   . Allergic rhinitis   . Unspecified essential hypertension   . Migraine headache   . 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 History of iron deficiency anemia   . History of migraine headaches   . Frequency of urination   . Recovering alcoholic     SINCE [id number]    Past Surgical History  Procedure Laterality Date  . Tonsillectomy and adenoidectomy  AGE 62  . Vaginal hysterectomy  1976  . Total knee arthroplasty Bilateral LEFT  1996/   RIGHT 2004  . Replacement total knee Left 2006  . Knee arthroscopy w/ meniscectomy Bilateral X2  LEFT /    X1  RIGHT  . Cataract extraction w/ intraocular lens  implant, bilateral    . Bunionectomy/  hammertoe correction  right foot  2011    Family History  Problem Relation Age of Onset  . Heart disease Father 15  . Hypertension Father   . Cancer Sister     skin CA, 2 sisters   Social History:  reports that she quit smoking about 28 years ago. Her smoking use included Cigarettes. She has a 7.5 pack-year smoking history. She has never used smokeless tobacco. She reports that she does not drink alcohol or use illicit drugs.  Allergies:  Allergies  Allergen Reactions  . Penicillins Anaphylaxis  . Erythromycin Other (See Comments)    SEVERE STOMACH CRAMPS  . Morphine And Related Nausea And Vomiting  . Nsaids Other (See Comments)    SEVERE  STOMACH CRAMPS  . Nickel Rash    Medications Prior to Admission  Medication Sig Dispense Refill  . aspirin EC 81 MG tablet Take 81 mg by mouth daily.      . Calcium Carb-Cholecalciferol (CALCIUM 600 + D PO) Take 2 tablets by mouth daily.      . citalopram (CELEXA) 20 MG tablet TAKE 1 TABLET (20 MG TOTAL) BY MOUTH DAILY.  100 tablet  2  . glucosamine-chondroitin 500-400 MG tablet Take 2 tablets by mouth daily.       Marland Kitchen lisinopril (PRINIVIL,ZESTRIL) 10 MG tablet Take 10 mg by mouth every morning.      . Multiple Vitamins-Minerals (MULTIVITAMIN WITH MINERALS) tablet Take 1 tablet by mouth daily.      Marland Kitchen HYDROcodone-acetaminophen (NORCO) 7.5-325 MG per tablet TAKE 1 TABLET BY MOUTH TWICE A DAY AS NEEDED  60 tablet  0    No results found for this or any previous visit (from the past 48 hour(s)). No results found.  Review of Systems  Constitutional: Negative.   HENT: Negative.   Respiratory: Negative.   Cardiovascular: Negative.   Gastrointestinal: Negative.   Genitourinary: Negative.   Musculoskeletal: Positive for joint pain.  Skin: Negative.   Neurological: Negative.   Endo/Heme/Allergies: Negative.   Psychiatric/Behavioral: Negative.     Blood pressure 125/73, pulse 54, temperature 97.3 F (36.3  C), temperature source Oral, resp. rate 16, height 5' 6.5" (1.689 m), weight 99.111 kg (218 lb 8 oz), SpO2 98.00%. Physical Exam  Constitutional: She is oriented to person, place, and time. She appears well-developed.  HENT:  Head: Normocephalic.  Eyes: EOM are normal.  Neck: Normal range of motion.  Cardiovascular: Normal rate, regular rhythm, normal heart sounds and intact distal pulses.   Respiratory: Effort normal and breath sounds normal.  GI: Bowel sounds are normal.  Genitourinary:  Deferred  Musculoskeletal:  Pain to right shoulder  Neurological: She is alert and oriented to person, place, and time.  Skin: Skin is dry.  Psychiatric: Her behavior is normal.      Assessment/Plan Right shoulder Arthroscopy: SAD, DCR, possible RCR, debridment D/c home today F/u in office in 7 days Follow d/c instructions  Tonya Bartlett L 05/23/2013, 7:33 AM

## 2013-05-23 NOTE — Anesthesia Procedure Notes (Addendum)
Anesthesia Regional Block:  Interscalene brachial plexus block  Pre-Anesthetic Checklist: ,, timeout performed, Correct Patient, Correct Site, Correct Laterality, Correct Procedure, Correct Position, site marked, Risks and benefits discussed,  Surgical consent,  Pre-op evaluation,  At surgeon's request and post-op pain management  Laterality: Right  Prep: chloraprep       Needles:  Injection technique: Single-shot  Needle Type: Echogenic Stimulator Needle     Needle Length: 9cm 9 cm Needle Gauge: 21 and 21 G    Additional Needles:  Procedures: ultrasound guided (picture in chart) Interscalene brachial plexus block Narrative:  Start time: 05/23/2013 8:25 AM End time: 05/23/2013 8:35 AM Injection made incrementally with aspirations every 5 mL.  Performed by: Personally  Anesthesiologist: Peyton Najjar MD  Additional Notes: Patient tolerated the procedure well without complications   Procedure Name: Intubation Date/Time: 05/23/2013 9:25 AM Performed by: Mechele Claude Pre-anesthesia Checklist: Patient identified, Emergency Drugs available, Suction available and Patient being monitored Patient Re-evaluated:Patient Re-evaluated prior to inductionOxygen Delivery Method: Circle System Utilized Preoxygenation: Pre-oxygenation with 100% oxygen Intubation Type: IV induction Ventilation: Mask ventilation without difficulty Grade View: Grade II Tube type: Oral Tube size: 7.0 mm Number of attempts: 1 Airway Equipment and Method: stylet Placement Confirmation: ETT inserted through vocal cords under direct vision,  positive ETCO2 and breath sounds checked- equal and bilateral Secured at: 21 cm Tube secured with: Tape Dental Injury: Teeth and Oropharynx as per pre-operative assessment

## 2013-05-23 NOTE — Anesthesia Postprocedure Evaluation (Signed)
  Anesthesia Post-op Note  Patient: Tonya Bartlett  Procedure(s) Performed: Procedure(s) (LRB): RIGHT SHOULDER ARTHROSCOPY EXAM UNDER ANESTHESIA  WITH SUBACROMIAL DECOMPRESSION,DISTAL CLAVICLE RESECTION, SADLABRAL DEBRIDEMENT CHONDROPLASTY, BICEP TENOTOMY  (Right)  Patient Location: PACU  Anesthesia Type: GA combined with regional for post-op pain  Level of Consciousness: awake and alert   Airway and Oxygen Therapy: Patient Spontanous Breathing  Post-op Pain: mild  Post-op Assessment: Post-op Vital signs reviewed, Patient's Cardiovascular Status Stable, Respiratory Function Stable, Patent Airway and No signs of Nausea or vomiting  Last Vitals:  Filed Vitals:   05/23/13 0711  BP: 125/73  Pulse: 54  Temp: 36.3 C  Resp: 16    Post-op Vital Signs: stable   Complications: No apparent anesthesia complications

## 2013-05-23 NOTE — Anesthesia Preprocedure Evaluation (Signed)
Anesthesia Evaluation  Patient identified by MRN, date of birth, ID band Patient awake    Reviewed: Allergy & Precautions, H&P , NPO status , Patient's Chart, lab work & pertinent test results  Airway Mallampati: II TM Distance: >3 FB Neck ROM: full    Dental  (+) Caps, Dental Advisory Given All teeth are capped:   Pulmonary neg pulmonary ROS, former smoker,  breath sounds clear to auscultation  Pulmonary exam normal       Cardiovascular Exercise Tolerance: Good hypertension, Pt. on medications Rhythm:regular Rate:Normal     Neuro/Psych negative neurological ROS  negative psych ROS   GI/Hepatic negative GI ROS, Neg liver ROS,   Endo/Other  negative endocrine ROS  Renal/GU negative Renal ROS  negative genitourinary   Musculoskeletal   Abdominal   Peds  Hematology negative hematology ROS (+)   Anesthesia Other Findings   Reproductive/Obstetrics negative OB ROS                           Anesthesia Physical Anesthesia Plan  ASA: II  Anesthesia Plan: General   Post-op Pain Management:    Induction: Intravenous  Airway Management Planned: Oral ETT  Additional Equipment:   Intra-op Plan:   Post-operative Plan: Extubation in OR  Informed Consent: I have reviewed the patients History and Physical, chart, labs and discussed the procedure including the risks, benefits and alternatives for the proposed anesthesia with the patient or authorized representative who has indicated his/her understanding and acceptance.   Dental Advisory Given  Plan Discussed with: CRNA and Surgeon  Anesthesia Plan Comments:         Anesthesia Quick Evaluation

## 2013-05-23 NOTE — Discharge Instructions (Signed)
Regional Anesthesia Blocks ° °1. Numbness or the inability to move the "blocked" extremity may last from 3-48 hours after placement. The length of time depends on the medication injected and your individual response to the medication. If the numbness is not going away after 48 hours, call your surgeon. ° °2. The extremity that is blocked will need to be protected until the numbness is gone and the  Strength has returned. Because you cannot feel it, you will need to take extra care to avoid injury. Because it may be weak, you may have difficulty moving it or using it. You may not know what position it is in without looking at it while the block is in effect. ° °3. For blocks in the legs and feet, returning to weight bearing and walking needs to be done carefully. You will need to wait until the numbness is entirely gone and the strength has returned. You should be able to move your leg and foot normally before you try and bear weight or walk. You will need someone to be with you when you first try to ensure you do not fall and possibly risk injury. ° °4. Bruising and tenderness at the needle site are common side effects and will resolve in a few days. ° °5. Persistent numbness or new problems with movement should be communicated to the surgeon or the [facility] ([phone])/ [facility] ([phone]). ° ° ° °Post Anesthesia Home Care Instructions ° °Activity: °Get plenty of rest for the remainder of the day. A responsible adult should stay with you for 24 hours following the procedure.  °For the next 24 hours, DO NOT: °-Drive a car °-Operate machinery °-Drink alcoholic beverages °-Take any medication unless instructed by your physician °-Make any legal decisions or sign important papers. ° °Meals: °Start with liquid foods such as gelatin or soup. Progress to regular foods as tolerated. Avoid greasy, spicy, heavy foods. If nausea and/or vomiting occur, drink only clear liquids until the  nausea and/or vomiting subsides. Call your physician if vomiting continues. ° °Special Instructions/Symptoms: °Your throat may feel dry or sore from the anesthesia or the breathing tube placed in your throat during surgery. If this causes discomfort, gargle with warm salt water. The discomfort should disappear within 24 hours. ° °

## 2013-05-23 NOTE — Transfer of Care (Signed)
Immediate Anesthesia Transfer of Care Note  Patient: Tonya Bartlett  Procedure(s) Performed: Procedure(s) (LRB): RIGHT SHOULDER ARTHROSCOPY EXAM UNDER ANESTHESIA  WITH SUBACROMIAL DECOMPRESSION,DISTAL CLAVICLE RESECTION, SADLABRAL DEBRIDEMENT CHONDROPLASTY, BICEP TENOTOMY  (Right)  Patient Location: PACU  Anesthesia Type: General  Level of Consciousness: awake, alert  and oriented  Airway & Oxygen Therapy: Patient Spontanous Breathing and Patient connected to face mask oxygen  Post-op Assessment: Report given to PACU RN and Post -op Vital signs reviewed and stable  Post vital signs: Reviewed and stable  Complications: No apparent anesthesia complications

## 2013-05-24 ENCOUNTER — Encounter (HOSPITAL_BASED_OUTPATIENT_CLINIC_OR_DEPARTMENT_OTHER): Payer: Self-pay | Admitting: Specialist

## 2013-05-24 NOTE — Op Note (Signed)
NAMEJOSEPHYNE, Tonya Bartlett NO.:  [id number]  MEDICAL RECORD NO.:  [id number]  LOCATION:                                 FACILITY:  PHYSICIAN:  Cynda Familia, M.D.DATE OF BIRTH:  1946/01/27  DATE OF PROCEDURE:  05/23/2013 DATE OF DISCHARGE:  05/23/2013                              OPERATIVE REPORT   PREOPERATIVE DIAGNOSES: 1. Right shoulder labral tear. 2. Biceps partial tear. 3. Glenohumeral arthritis. 4. Impingement. 5. AC arthritis. 6. Partial rotator cuff tear, recurrent tear.  POSTOPERATIVE DIAGNOSES: 1. Right shoulder extensive type 1 labral tearing. 2. 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 3. Rotator cuff tendinopathy with partial tear.  No complete tear.     Impingement syndrome. 4. Acromioclavicular arthritis. 5. High-grade partial biceps tendon tear, biceps tendinopathy.  PROCEDURES: 1. Right shoulder glenohumeral arthroscopy with labral debridement. 2. Chondroplasty of glenoid and humeral head. 3. Arthroscopic biceps tenotomy. 4. Debridement of partial rotator cuff tear with arthroscopic     subacromial decompression, acromioplasty, bursectomy, and CA     ligament release, and removal of sutures. 5. Arthroscopic distal clavicle resection, Mumford procedure.  SURGEON:  Cynda Familia, M.D.  ASSISTANT:  Wyatt Portela, PA-C  ANESTHESIA:  Interscalene block, general.  ESTIMATED BLOOD LOSS:  Less than 10 mL.  DRAINS:  None.  COMPLICATIONS:  None.  DISPOSITION:  PACU, stable.  OPERATIVE DETAILS:  The patient and family were counseled in the holding area.  Correct site was identified.  Marked and signed appropriately. Interscalene block was administered on the way to the operating room. IV Ancef was given.  IV vancomycin was given due to __________ antibiotic allergic reaction.  Placed in supine position with general anesthesia.  Turned to a left lateral decubitus position,  properly padded and bumped.  The patient was prepped with DuraPrep and draped into a sterile fashion.  Overhead shoulder positioner was utilized, 30 degrees of abduction, 10 degrees of forward flexion, and 10 pounds of light traction.  Time-out again had been done.  Posterior portal was created and arthroscope was placed into the glenohumeral joint. Immediately identifiable was extensive grade 3 and 4 chondromalacia of glenoid humeral head, extensive type 1 labral tear near circumferentially.  Extensive high-grade partial tear of the biceps tendon with __________ biceps tendinopathy and partial tear in the rotator cuff with the joint held, fluid did not extravasate.  An anterior portal was made __________ technique through rotator cuff interval.  Motorized shaver was introduced and a labral debridement was carried back to nice smooth edges circumferentially.  Chondroplasty of femoral humeral head and glenoid and removed the most chondral fragments as possible to smooth the articulating surfaces.  ArthroCare system utilized to perform a biceps tenotomy.  Released the biceps from the supraglenoid tubercle.  Smoothed down nicely. Hemostasis was obtained.  [place] was introduced, undersurface of rotator cuff smoothed back to smooth healthy tissue __________ response. Localizing suture was in place, __________ bursal side.  The arthroscope was placed to the bursal side, where bursitis was encountered, lateral portal was established.  Neurovascular structures were protected __________ the axillary nerve.  Motorized shaver was introduced,  a subacromial bursectomy was performed, and the old sutures from the previous repair were removed that were loose.  She had undergone a previous rotator cuff repair in the past, what looked like an augmentation patch which had incorporated well.  At this point in time with her marked glenohumeral arthritis, partial tear of rotator cuff, biceps and rotator  cuff tendinopathy without a complete frank tear, I did not feel __________ to take this down.  Therefore, it was left intact after smoothing the edges.  The ArthroCare system was utilized to release the periosteum and CA ligament.  Bur was then placed posteriorly and using my cutting block technique, the anterior and lateral acromion was beveled down smoothly.  We removed the anterior and lateral osteophytes.  Making sure the acromion was not cut too thin and was left off with sufficient thickness.  There was a medial acromial spur and an underlying subclavicular spur.  The bur was placed from anterior into the [facility] joint region.  At this point in time, the medial acromial spur was removed and the lateral 5-8 mm of the clavicle was removed circumferentially.  The superior capsule intact.  The clavicle was palpated and found to be stable.  Debris removed.  Hemostasis was obtained.  There were no other abnormalities noted.  A 19-gauge needle was placed into the glenohumeral joint and 80 mg of Kenalog and 10 mL of 0.25% Marcaine was placed into the shoulder joint.  After confirming the anesthesia, the arthroscopic equipment was removed.  __________ shoulder gently rotated, made sure there was no complicating features.  She was nicely reduced.  The skin was closed with nylons, another 10 mL of 0.25% Marcaine was placed into the skin subacromial region.  Sterile dressing applied.  The patient __________ turned supine, awakened, and taken from the operative room to PACU in stable condition.  She will be stabilized in PACU and discharged to home.  No complications or problems.  To help with patient positioning, prepping, draping, technical or surgical assistance throughout the entire case, wound closure application, dressing, and sling, Mr. Wyatt Portela, PA-C's assistance was needed throughout the entire case.          ______________________________ Cynda Familia,  M.D.     RAC/MEDQ  D:  05/23/2013  T:  05/24/2013  Job:  [id number]

## 2013-06-19 ENCOUNTER — Telehealth: Payer: Self-pay | Admitting: Family Medicine

## 2013-06-19 MED ORDER — LISINOPRIL 10 MG PO TABS: 10.0000 mg | ORAL_TABLET | Freq: Every morning | ORAL | Status: DC

## 2013-06-19 NOTE — Telephone Encounter (Signed)
[id number] - [place], [place] - [address] is requesting re-fill on lisinopril (PRINIVIL,ZESTRIL) 10 MG tablet

## 2013-06-29 ENCOUNTER — Telehealth: Payer: Self-pay | Admitting: Family Medicine

## 2013-06-29 NOTE — Telephone Encounter (Signed)
Pt req rx on  HYDROCODONE

## 2013-07-03 MED ORDER — OXYCODONE-ACETAMINOPHEN 5-325 MG PO TABS: 1.0000 | ORAL_TABLET | Freq: Four times a day (QID) | ORAL | Status: DC | PRN

## 2013-07-03 NOTE — Telephone Encounter (Signed)
Rx ready for pick up and patient is aware 

## 2013-08-04 ENCOUNTER — Telehealth: Payer: Self-pay | Admitting: Family Medicine

## 2013-08-04 NOTE — Telephone Encounter (Signed)
Pt is needing new rx for hydrocodone. Please call when available for pick

## 2013-08-07 MED ORDER — OXYCODONE-ACETAMINOPHEN 5-325 MG PO TABS: 1.0000 | ORAL_TABLET | Freq: Four times a day (QID) | ORAL | Status: DC | PRN

## 2013-08-07 MED ORDER — HYDROCODONE-ACETAMINOPHEN 7.5-325 MG PO TABS: 1.0000 | ORAL_TABLET | Freq: Two times a day (BID) | ORAL | Status: DC

## 2013-08-21 ENCOUNTER — Other Ambulatory Visit: Payer: Self-pay | Admitting: Family Medicine

## 2013-09-07 ENCOUNTER — Telehealth: Payer: Self-pay | Admitting: Family Medicine

## 2013-09-07 MED ORDER — HYDROCODONE-ACETAMINOPHEN 7.5-325 MG PO TABS: 1.0000 | ORAL_TABLET | Freq: Two times a day (BID) | ORAL | Status: DC

## 2013-09-07 NOTE — Telephone Encounter (Signed)
rx ready for pick up and patient is aware  

## 2013-09-07 NOTE — Telephone Encounter (Signed)
Pt needs new rx hydrocodone. Pt is aware md out of office °

## 2013-09-25 ENCOUNTER — Telehealth: Payer: Self-pay | Admitting: Family Medicine

## 2013-09-25 MED ORDER — ZOSTER VACCINE LIVE 19400 UNT/0.65ML ~~LOC~~ SOLR: 0.6500 mL | Freq: Once | SUBCUTANEOUS | Status: DC

## 2013-09-25 NOTE — Telephone Encounter (Signed)
Rx sent 

## 2013-09-25 NOTE — Telephone Encounter (Signed)
Pt needs rx for shingle vaccine fax to [facility]

## 2013-09-29 ENCOUNTER — Other Ambulatory Visit: Payer: Self-pay | Admitting: Family Medicine

## 2013-10-09 ENCOUNTER — Ambulatory Visit (INDEPENDENT_AMBULATORY_CARE_PROVIDER_SITE_OTHER): Payer: Medicare Other | Admitting: Physician Assistant

## 2013-10-09 ENCOUNTER — Encounter: Payer: Self-pay | Admitting: Physician Assistant

## 2013-10-09 VITALS — BP 102/70 | HR 72 | Temp 98.4°F | Resp 18 | Wt 213.0 lb

## 2013-10-09 DIAGNOSIS — G5622 Lesion of ulnar nerve, left upper limb: Secondary | ICD-10-CM

## 2013-10-09 DIAGNOSIS — G5620 Lesion of ulnar nerve, unspecified upper limb: Secondary | ICD-10-CM

## 2013-10-09 DIAGNOSIS — J029 Acute pharyngitis, unspecified: Secondary | ICD-10-CM

## 2013-10-09 DIAGNOSIS — L821 Other seborrheic keratosis: Secondary | ICD-10-CM

## 2013-10-09 NOTE — Progress Notes (Signed)
Pre visit review using our clinic review tool, if applicable. No additional management support is needed unless otherwise documented below in the visit note. 

## 2013-10-09 NOTE — Progress Notes (Signed)
Subjective:    Patient ID: Tonya Bartlett, female    DOB: Nov 11, 1945, 68 y.o.   MRN: [id number]  Sore Throat  This is a new problem. The current episode started in the past 7 days (4 days). The problem has been gradually improving. Neither side of throat is experiencing more pain than the other. There has been no fever. The pain is at a severity of 1/10. The pain is mild. Associated symptoms include coughing, a hoarse voice, stridor and swollen glands. Pertinent negatives include no abdominal pain, congestion, diarrhea, drooling, ear discharge, ear pain, headaches, plugged ear sensation, neck pain, shortness of breath, trouble swallowing or vomiting. She has had no exposure to strep or mono. She has tried acetaminophen and gargles (benadryl) for the symptoms. The treatment provided moderate relief.   The pt is also asking about a lesion in her outer left ear that she has noticed for the past few months. It is not bothering her, and she has not noticed any other symptoms or bleeding related to it.  She is also wondering about tingling and numbness occasionally in her left 3rd, 4th and 5th fingers. This has been present off and on for the past few weeks. She thinks it correlates with when she first started playing solitaire on her phone. She denies any shooting pain.   Review of Systems  Constitutional: Negative for fever and chills.  HENT: Positive for hoarse voice and sore throat. Negative for congestion, drooling, ear discharge, ear pain, postnasal drip, rhinorrhea, sinus pressure and trouble swallowing.   Respiratory: Positive for cough and stridor. Negative for shortness of breath.   Cardiovascular: Negative for chest pain.  Gastrointestinal: Negative for nausea, vomiting, abdominal pain and diarrhea.  Musculoskeletal: Negative for neck pain.  Neurological: Negative for syncope and headaches.  All other systems reviewed and are negative.   Past Medical History  Diagnosis Date  . OA  (osteoarthritis)     RIGHT SHOULDER  . Right rotator cuff tear   . Hyperlipidemia   . Allergic rhinitis   . Unspecified essential hypertension   . Migraine headache   . 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 History of iron deficiency anemia   . History of migraine headaches   . Frequency of urination   . Recovering alcoholic     SINCE [id number]    History   Social History  . Marital Status: Married    Spouse Name: N/A    Number of Children: N/A  . Years of Education: N/A   Occupational History  . Not on file.   Social History Main Topics  . Smoking status: Former Smoker -- 0.50 packs/day for 15 years    Types: Cigarettes    Quit date: 05/15/1985  . Smokeless tobacco: Never Used  . Alcohol Use: No     Comment: RECOVERING ALCOHOLIC--  QUIT [id number]  . Drug Use: No  . Sexual Activity: Not on file   Other Topics Concern  . Not on file   Social History Narrative  . No narrative on file    Past Surgical History  Procedure Laterality Date  . Tonsillectomy and adenoidectomy  AGE 89  . Vaginal hysterectomy  1976  . Total knee arthroplasty Bilateral LEFT  1996/   RIGHT 2004  . Replacement total knee Left 2006  . Knee arthroscopy w/ meniscectomy Bilateral X2  LEFT /    X1  RIGHT  . Cataract extraction  w/ intraocular lens  implant, bilateral    . Bunionectomy/  hammertoe correction  right foot  2011  . Shoulder arthroscopy with subacromial decompression, rotator cuff repair and bicep tendon repair Right 05/23/2013    Procedure: RIGHT SHOULDER ARTHROSCOPY EXAM UNDER ANESTHESIA  WITH SUBACROMIAL DECOMPRESSION,DISTAL CLAVICLE RESECTION, SADLABRAL DEBRIDEMENT CHONDROPLASTY, BICEP TENOTOMY ;  Surgeon: Sydnee Cabal, MD;  Location: [facility];  Service: Orthopedics;  Laterality: Right;    Family History  Problem Relation Age of Onset  . Heart disease Father 40  . Hypertension Father   . Cancer Sister     skin CA, 2 sisters     Allergies  Allergen Reactions  . Penicillins Anaphylaxis  . Erythromycin Other (See Comments)    SEVERE STOMACH CRAMPS  . Morphine And Related Nausea And Vomiting  . Nsaids Other (See Comments)    SEVERE STOMACH CRAMPS  . Nickel Rash    Current Outpatient Prescriptions on File Prior to Visit  Medication Sig Dispense Refill  . Calcium Carb-Cholecalciferol (CALCIUM 600 + D PO) Take 2 tablets by mouth daily.      . citalopram (CELEXA) 20 MG tablet TAKE 1 TABLET (20 MG TOTAL) BY MOUTH DAILY.  100 tablet  1  . glucosamine-chondroitin 500-400 MG tablet Take 2 tablets by mouth daily.       Marland Kitchen lisinopril (PRINIVIL,ZESTRIL) 10 MG tablet Take 1 tablet (10 mg total) by mouth every morning.  90 tablet  0  . lisinopril-hydrochlorothiazide (PRINZIDE,ZESTORETIC) 10-12.5 MG per tablet TAKE 1 TABLET BY MOUTH DAILY.  100 tablet  0  . Multiple Vitamins-Minerals (MULTIVITAMIN WITH MINERALS) tablet Take 1 tablet by mouth daily.      Marland Kitchen zoster vaccine live, PF, (ZOSTAVAX) 76734 UNT/0.65ML injection Inject 19,400 Units into the skin once.  1 each  0  . HYDROcodone-acetaminophen (NORCO) 7.5-325 MG per tablet Take 1 tablet by mouth 2 (two) times daily.  60 tablet  0   No current facility-administered medications on file prior to visit.    EXAM: BP 102/70  Pulse 72  Temp(Src) 98.4 F (36.9 C) (Oral)  Resp 18  Wt 213 lb (96.616 kg)  SpO2 95%      Objective:   Physical Exam  Nursing note and vitals reviewed. Constitutional: She is oriented to person, place, and time. She appears well-developed and well-nourished. No distress.  HENT:  Head: Normocephalic and atraumatic.  Right Ear: External ear normal.  Left Ear: External ear normal.  Nose: Nose normal.  Mouth/Throat: No oropharyngeal exudate.  Oropharynx is slightly erythematous, no exudate. Bilateral TMs normal. Bilateral frontal and maxillary sinuses non-TTP.  Eyes: Conjunctivae and EOM are normal. Pupils are equal, round, and reactive to  light.  Neck: Normal range of motion. Neck supple.  Cardiovascular: Normal rate, regular rhythm and intact distal pulses.   Pulmonary/Chest: Effort normal and breath sounds normal. No stridor. No respiratory distress. She has no wheezes. She has no rales. She exhibits no tenderness.  Musculoskeletal: Normal range of motion. She exhibits no edema and no tenderness.  Tinel sign positive at the cubital tunnel on the left. Phalen and tinel normal at carpal tunnel on the left.  Lymphadenopathy:    She has no cervical adenopathy.  Neurological: She is alert and oriented to person, place, and time.  Sensation and strength grossly intact bilaterally.  Skin: Skin is warm and dry. She is not diaphoretic.  Left external ear has a flat, waxy appearing lesion, less than 1 cm diameter. No bleeding. Non-ttp. No  erythema.  Psychiatric: She has a normal mood and affect. Her behavior is normal. Judgment and thought content normal.     Lab Results  Component Value Date   WBC 5.4 11/17/2011   HGB 12.2 05/23/2013   HCT 36.0 05/23/2013   PLT 307.0 11/17/2011   GLUCOSE 97 05/23/2013   CHOL 183 11/12/2010   TRIG 66.0 11/12/2010   HDL 65.60 11/12/2010   LDLDIRECT 187.9 10/08/2009   LDLCALC 104* 11/12/2010   ALT 22 07/04/2011   AST 18 07/04/2011   NA 141 05/23/2013   K 3.7 05/23/2013   CL 103 11/17/2011   CREATININE 0.5 11/17/2011   BUN 9 11/17/2011   CO2 29 11/17/2011   TSH 0.93 11/17/2011   INR 1.0 07/12/2007        Assessment & Plan:  Drema was seen today for laryngitis.  Diagnoses and associated orders for this visit:  Pharyngitis Comments: Improving? Continue OTC symptomatic treatment. Add cepacol lozenges.   Seborrheic keratosis Comments: Involving ear. Pt wants this removed. Advised pt to schedule appointment with her dermatologist to discuss.  Cubital tunnel syndrome on left Comments: Started after playing phone games. Advised pt to contact her orthopedist if this persists.    Return precautions  provided, and patient handout on pharyngitis, seborrheic keratosis, cubital tunnel syndrome.  Plan to follow up as needed, or for worsening or persistent symptoms despite treatment.  Patient Instructions  Continue using saltwater gargles.  Drink cool liquids to sooth your throat.   Try taking over-the-counter Cepacol drops as directed.  You can continue Tylenol as needed for pain.  Call to schedule an appointment with your dermatologist to evaluate the lesion in your ear.  Call to schedule an appointment with your orthopedist to have your numbness evaluated. This is likely a cubital tunnel related issue, and conservative treatment will be first line.  If emergency symptoms discussed during visit developed, seek medical attention immediately.  Followup as needed, or for worsening or persistent symptoms despite treatment.

## 2013-10-09 NOTE — Patient Instructions (Addendum)
Continue using saltwater gargles.  Drink cool liquids to sooth your throat.   Try taking over-the-counter Cepacol drops as directed.  You can continue Tylenol as needed for pain.  Call to schedule an appointment with your dermatologist to evaluate the lesion in your ear.  Call to schedule an appointment with your orthopedist to have your numbness evaluated. This is likely a cubital tunnel related issue, and conservative treatment will be first line.  If emergency symptoms discussed during visit developed, seek medical attention immediately.  Followup as needed, or for worsening or persistent symptoms despite treatment.    Pharyngitis Pharyngitis is a sore throat (pharynx). There is redness, pain, and swelling of your throat. HOME CARE   Drink enough fluids to keep your pee (urine) clear or pale yellow.  Only take medicine as told by your doctor.  You may get sick again if you do not take medicine as told. Finish your medicines, even if you start to feel better.  Do not take aspirin.  Rest.  Rinse your mouth (gargle) with salt water ( tsp of salt per 1 qt of water) every 1-2 hours. This will help the pain.  If you are not at risk for choking, you can suck on hard candy or sore throat lozenges. GET HELP IF:  You have large, tender lumps on your neck.  You have a rash.  You cough up green, yellow-brown, or bloody spit. GET HELP RIGHT AWAY IF:   You have a stiff neck.  You drool or cannot swallow liquids.  You throw up (vomit) or are not able to keep medicine or liquids down.  You have very bad pain that does not go away with medicine.  You have problems breathing (not from a stuffy nose). MAKE SURE YOU:   Understand these instructions.  Will watch your condition.  Will get help right away if you are not doing well or get worse. Document Released: 07/22/2007 Document Revised: 11/23/2012 Document Reviewed: 10/10/2012 [facility] Patient Information 2015  [place], [place]. This information is not intended to replace advice given to you by your health care provider. Make sure you discuss any questions you have with your health care provider. Seborrheic Keratosis Seborrheic keratosis is a common, noncancerous (benign) skin growth that can occur anywhere on the skin.It looks like "stuck-on," waxy, rough, tan, brown, or black spots on the skin. These skin growths can be flat or raised.They are often called "barnacles" because of their pasted-on appearance.Usually, these skin growths appear in adulthood, around age 31, and increase in number as you age. They may also develop during pregnancy or following estrogen therapy. Many people may only have one growth appear in their lifetime, while some people may develop many growths. CAUSES It is unknown what causes these skin growths, but they appear to run in families. SYMPTOMS Seborrheic keratosis is often located on the face, chest, shoulders, back, or other areas. These growths are:  Usually painless, but may become irritated and itchy.  Yellow, brown, black, or other colors.  Slightly raised or have a flat surface.  Sometimes rough or wart-like in texture.  Often waxy on the surface.  Round or oval-shaped.  Sometimes "stuck-on" in appearance.  Sometimes single, but there are usually many growths. Any growth that bleeds, itches on a regular basis, becomes inflamed, or becomes irritated needs to be evaluated by a skin specialist (dermatologist). DIAGNOSIS Diagnosis is mainly based on the way the growths appear. In some cases, it can be difficult to tell this type  of skin growth from skin cancer. A skin growth tissue sample (biopsy) may be used to confirm the diagnosis. TREATMENT Most often, treatment is not needed because the skin growths are benign.If the skin growth is irritated easily by clothing or jewelry, causing it to scab or bleed, treatment may be recommended. Patients may also choose to  have the growths removed because they do not like their appearance. Most commonly, these growths are treated with cryosurgery. In cryosurgery, liquid nitrogen is applied to "freeze" the growth. The growth usually falls off within a matter of days. A blister may form and dry into a scab that will also fall off. After the growth or scab falls off, it may leave a dark or light spot on the skin. This color may fade over time, or it may remain permanent on the skin. HOME CARE INSTRUCTIONS If the skin growths are treated with cryosurgery, the treated area needs to be kept clean with water and soap. SEEK MEDICAL CARE IF:  You have questions about these growths or other skin problems.  You develop new symptoms, including:  A change in the appearance of the skin growth.  New growths.  Any bleeding, itching, or pain in the growths.  A skin growth that looks similar to seborrheic keratosis. Document Released: 03/07/2010 Document Revised: 04/27/2011 Document Reviewed: 03/07/2010 [facility] Patient Information 2015 [place], [place]. This information is not intended to replace advice given to you by your health care provider. Make sure you discuss any questions you have with your health care provider. Cubital Tunnel Syndrome (Ulnar Neuritis) Cubital tunnel syndrome is a disorder of the nervous system of the elbow and upper arm the causes pain, tingling, weakness of the hand, or the loss of feeling in the ring and little fingers. The disorder is caused by a compression or stretching of the ulnar nerve at the elbow and in the forearm by muscles or ligament-like tissues. The ulnar nerve is susceptible to injury because it has little tissue that protects it at the elbow. The lack of protection increases the risk of injury. Cubital tunnel syndrome may decrease athletic performance in sports that require strong hand or wrist actions (tennis or racquetball).  SYMPTOMS   Clumsiness or weakness of the hand.  Poor  dexterity (fine hand function).  Tenderness of the inner elbow.  Aching or soreness of the inner elbow.  Increased pain with forced full-elbow bending.  Reduced control with throwing, such as pitching.  Tingling, numbness, or burning inside the forearm or in part of the hand or fingers (especially the little finger or ring finger).  Sharp pains that shoot from the elbow down to the wrist and hand.  A weak grip, especially power grip, and a weak pinch.  Reduced performance in sports that require a strong grip. CAUSES   Increased pressure on the ulnar nerve at the elbow, arm, or forearm caused by swollen, inflamed, or scarred tissues; ligament-like; or between muscles.  Stretching of the nerve due to loose elbow ligaments.  Trauma to the nerve at the elbow.  Repetitive elbow bending. RISK INCREASES WITH:  Poor strength or flexibility.  Inadequate warm-up properly physical activity.  Diabetes mellitus.  under-active thyroid gland(hypothyroidism).  Repetitive and/or strenuous throwing motions such as baseball and javelin throwing.  Contact sports (football, soccer, rugby, or lacrosse).  Other elbow conditions (medial epicondylitis or loose inner elbow ligaments). PREVENTION  Warm up and stretch properly before activity.  Maintain physical fitness:  Wrist, forearm, and elbow flexibility.  Muscle strength  and endurance.  Cardiovascular fitness.  Wear proper protective equipment, including elbow pads.  Learn and use proper throwing techniques. PROGNOSIS  Cubital tunnel syndrome is typically curable is treated appropriately. Is some cases the condition may heal without treatment. If the muscle begins to waste or the nerve damage worsens, surgery may be necessary. RELATED COMPLICATIONS   Permanent numbness and weakness of the ring and little fingers.  Weak grip.  Permanent paralysis of some hand and finger muscles.  Risks associated with surgery, including  infection, bleeding, injury to nerves (including the ulnar nerve), recurrent or continued symptoms, and elbow stiffness. TREATMENT  Treatment initially involves stopping the activities that cause the symptoms to worsen. Medications and ice can be used to reduce pain in inflammation. Splinting and protecting the elbow with padding (especially at night) to prevent full bending of the elbow may help. Stretching and strengthening exercises of the muscles of the forearm and elbow are important, and they may be performed at home or with the assistance of a therapist. If conservative treatment is not successful, surgery may be necessary to reduce compression of the nerve. Before return to sport, assessment for proper throwing and hitting mechanics is important.  MEDICATION   If pain medication is necessary, nonsteroidal anti-inflammatory medications, such as aspirin and ibuprofen, or other minor pain relievers, such as acetaminophen, are often recommended. Contact your caregiver immediately if any bleeding, stomach upset, or signs of an allergic reaction occur.  Prescription pain relievers are usually only prescribed after surgery. Use only as directed and only as much as you need. COLD THERAPY   Cold treatment (icing) relieves pain and reduces inflammation. Cold treatment should be applied for 10 to 15 minutes every 2 to 3 hours for inflammation and pain and immediately after any activity that aggravates your symptoms. Use ice packs or an ice massage. SEEK MEDICAL CARE IF:   Symptoms get worse or do not improve in 2 weeks despite treatment.  You experience pain, numbness, or coldness in the hand.  Blue, gray, or dark color appears in the fingernails.  Any of the following occur after surgery: increased pain, swelling, redness, drainage, or bleeding in the surgical area or signs of infection.  New, unexplained symptoms develop (drugs used in treatment may produce side effects). Document Released:  02/02/2005 Document Revised: 04/27/2011 Document Reviewed: 05/17/2008 [facility] Patient Information 2015 [place], [place]. This information is not intended to replace advice given to you by your health care provider. Make sure you discuss any questions you have with your health care provider.

## 2013-10-13 ENCOUNTER — Encounter: Payer: Self-pay | Admitting: Physician Assistant

## 2013-10-13 ENCOUNTER — Ambulatory Visit (INDEPENDENT_AMBULATORY_CARE_PROVIDER_SITE_OTHER): Payer: Medicare Other | Admitting: Physician Assistant

## 2013-10-13 ENCOUNTER — Other Ambulatory Visit: Payer: Self-pay | Admitting: Physician Assistant

## 2013-10-13 VITALS — BP 120/80 | HR 84 | Temp 98.2°F | Resp 18 | Wt 210.0 lb

## 2013-10-13 DIAGNOSIS — H6690 Otitis media, unspecified, unspecified ear: Secondary | ICD-10-CM

## 2013-10-13 DIAGNOSIS — J0100 Acute maxillary sinusitis, unspecified: Secondary | ICD-10-CM

## 2013-10-13 DIAGNOSIS — H6692 Otitis media, unspecified, left ear: Secondary | ICD-10-CM

## 2013-10-13 MED ORDER — HYDROCOD POLST-CHLORPHEN POLST 10-8 MG/5ML PO LQCR: 5.0000 mL | Freq: Every evening | ORAL | Status: DC | PRN

## 2013-10-13 MED ORDER — SULFAMETHOXAZOLE-TMP DS 800-160 MG PO TABS: 1.0000 | ORAL_TABLET | Freq: Two times a day (BID) | ORAL | Status: DC

## 2013-10-13 NOTE — Progress Notes (Signed)
Pre visit review using our clinic review tool, if applicable. No additional management support is needed unless otherwise documented below in the visit note. 

## 2013-10-13 NOTE — Progress Notes (Addendum)
Subjective:    Patient ID: Tonya Bartlett, female    DOB: 30-Jun-1945, 68 y.o.   MRN: [id number]  Sinusitis This is a new problem. The current episode started 1 to 4 weeks ago (about 8 days). The problem has been gradually worsening (was seen monday for a sore throat, however this seemed to be improving at that time.) since onset. There has been no fever. Her pain is at a severity of 4/10. The pain is moderate. Associated symptoms include chills, congestion, coughing (more at night, keeps up), ear pain, headaches and sinus pressure (maxillary sinus, and tooth sensation.). Pertinent negatives include no diaphoresis, hoarse voice, neck pain, shortness of breath, sneezing, sore throat or swollen glands. Past treatments include acetaminophen (cepacol.). The treatment provided mild relief.      Review of Systems  Constitutional: Positive for chills. Negative for fever and diaphoresis.  HENT: Positive for congestion, ear pain, postnasal drip and sinus pressure (maxillary sinus, and tooth sensation.). Negative for hoarse voice, sneezing and sore throat.   Respiratory: Positive for cough (more at night, keeps up). Negative for shortness of breath.   Cardiovascular: Negative for chest pain.  Gastrointestinal: Negative for nausea, vomiting and diarrhea.  Musculoskeletal: Negative for neck pain.  Neurological: Positive for headaches. Negative for syncope.  All other systems reviewed and are negative.    Past Medical History  Diagnosis Date  . OA (osteoarthritis)     RIGHT SHOULDER  . Right rotator cuff tear   . Hyperlipidemia   . Allergic rhinitis   . Unspecified essential hypertension   . Migraine headache   . 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 History of iron deficiency anemia   . History of migraine headaches   . Frequency of urination   . Recovering alcoholic     SINCE [id number]    History   Social History  . Marital Status: Married    Spouse  Name: N/A    Number of Children: N/A  . Years of Education: N/A   Occupational History  . Not on file.   Social History Main Topics  . Smoking status: Former Smoker -- 0.50 packs/day for 15 years    Types: Cigarettes    Quit date: 05/15/1985  . Smokeless tobacco: Never Used  . Alcohol Use: No     Comment: RECOVERING ALCOHOLIC--  QUIT [id number]  . Drug Use: No  . Sexual Activity: Not on file   Other Topics Concern  . Not on file   Social History Narrative  . No narrative on file    Past Surgical History  Procedure Laterality Date  . Tonsillectomy and adenoidectomy  AGE 68  . Vaginal hysterectomy  1976  . Total knee arthroplasty Bilateral LEFT  1996/   RIGHT 2004  . Replacement total knee Left 2006  . Knee arthroscopy w/ meniscectomy Bilateral X2  LEFT /    X1  RIGHT  . Cataract extraction w/ intraocular lens  implant, bilateral    . Bunionectomy/  hammertoe correction  right foot  2011  . Shoulder arthroscopy with subacromial decompression, rotator cuff repair and bicep tendon repair Right 05/23/2013    Procedure: RIGHT SHOULDER ARTHROSCOPY EXAM UNDER ANESTHESIA  WITH SUBACROMIAL DECOMPRESSION,DISTAL CLAVICLE RESECTION, SADLABRAL DEBRIDEMENT CHONDROPLASTY, BICEP TENOTOMY ;  Surgeon: Sydnee Cabal, MD;  Location: [facility];  Service: Orthopedics;  Laterality: Right;    Family History  Problem Relation Age of Onset  .  Heart disease Father 24  . Hypertension Father   . Cancer Sister     skin CA, 2 sisters    Allergies  Allergen Reactions  . Penicillins Anaphylaxis  . Erythromycin Other (See Comments)    SEVERE STOMACH CRAMPS  . Morphine And Related Nausea And Vomiting  . Nsaids Other (See Comments)    SEVERE STOMACH CRAMPS  . Nickel Rash    Current Outpatient Prescriptions on File Prior to Visit  Medication Sig Dispense Refill  . Calcium Carb-Cholecalciferol (CALCIUM 600 + D PO) Take 2 tablets by mouth daily.      . citalopram (CELEXA) 20 MG  tablet TAKE 1 TABLET (20 MG TOTAL) BY MOUTH DAILY.  100 tablet  1  . glucosamine-chondroitin 500-400 MG tablet Take 2 tablets by mouth daily.       Marland Kitchen HYDROcodone-acetaminophen (NORCO) 7.5-325 MG per tablet Take 1 tablet by mouth 2 (two) times daily.  60 tablet  0  . lisinopril (PRINIVIL,ZESTRIL) 10 MG tablet Take 1 tablet (10 mg total) by mouth every morning.  90 tablet  0  . lisinopril-hydrochlorothiazide (PRINZIDE,ZESTORETIC) 10-12.5 MG per tablet TAKE 1 TABLET BY MOUTH DAILY.  100 tablet  0  . Multiple Vitamins-Minerals (MULTIVITAMIN WITH MINERALS) tablet Take 1 tablet by mouth daily.      Marland Kitchen zoster vaccine live, PF, (ZOSTAVAX) 35361 UNT/0.65ML injection Inject 19,400 Units into the skin once.  1 each  0   No current facility-administered medications on file prior to visit.    EXAM: BP 120/80  Pulse 84  Temp(Src) 98.2 F (36.8 C) (Oral)  Resp 18  Wt 210 lb (95.255 kg)     Objective:   Physical Exam  Nursing note and vitals reviewed. Constitutional: She is oriented to person, place, and time. She appears well-developed and well-nourished. No distress.  HENT:  Head: Normocephalic and atraumatic.  Right Ear: External ear normal.  Left Ear: External ear normal.  Nose: Nose normal.  Mouth/Throat: No oropharyngeal exudate.  Oropharynx is slightly erythematous, no exudate. Right TM mildly hyperemic. Left TM bulging and erythematous. Bilateral frontal sinuses non-TTP. Right maxillary sinus TTP. Left maxillary sinus non-TTP.  Eyes: Conjunctivae and EOM are normal. Pupils are equal, round, and reactive to light.  Neck: Normal range of motion. Neck supple.  Cardiovascular: Normal rate, regular rhythm and intact distal pulses.   Pulmonary/Chest: Effort normal and breath sounds normal. No stridor. No respiratory distress. She has no wheezes. She has no rales. She exhibits no tenderness.  Lymphadenopathy:    She has no cervical adenopathy.  Neurological: She is alert and oriented to  person, place, and time.  Skin: Skin is warm and dry. She is not diaphoretic.  Psychiatric: She has a normal mood and affect. Her behavior is normal. Judgment and thought content normal.    Lab Results  Component Value Date   WBC 5.4 11/17/2011   HGB 12.2 05/23/2013   HCT 36.0 05/23/2013   PLT 307.0 11/17/2011   GLUCOSE 97 05/23/2013   CHOL 183 11/12/2010   TRIG 66.0 11/12/2010   HDL 65.60 11/12/2010   LDLDIRECT 187.9 10/08/2009   LDLCALC 104* 11/12/2010   ALT 22 07/04/2011   AST 18 07/04/2011   NA 141 05/23/2013   K 3.7 05/23/2013   CL 103 11/17/2011   CREATININE 0.5 11/17/2011   BUN 9 11/17/2011   CO2 29 11/17/2011   TSH 0.93 11/17/2011   INR 1.0 07/12/2007         Assessment & Plan:  Tonya Bartlett was seen today for sinusitis.  Diagnoses and associated orders for this visit:  Acute left otitis media, recurrence not specified, unspecified otitis media type Comments: Due to allergies, will treat with Bactrim. Also, add otc antihistamine and nasal steroid. - sulfamethoxazole-trimethoprim (BACTRIM DS) 800-160 MG per tablet; Take 1 tablet by mouth 2 (two) times daily.  Acute maxillary sinusitis, recurrence not specified Comments: Right Focal ttp. again treating with Bactrim due to allergies. Tussionex for cough. Add otc mucinex, antihistamine, nasal steroid spray, rest, push fluids. - sulfamethoxazole-trimethoprim (BACTRIM DS) 800-160 MG per tablet; Take 1 tablet by mouth 2 (two) times daily. - Discontinue: chlorpheniramine-HYDROcodone (TUSSIONEX PENNKINETIC ER) 10-8 MG/5ML LQCR; Take 5 mLs by mouth at bedtime as needed for cough.    Advised patient on No driving while taking Tussionex.  Return precautions provided, and patient handout on sinusitis and otitis media.  Plan to follow up as needed, or for worsening or persistent symptoms despite treatment.  Patient Instructions  Bactrim DS twice daily for 10 days to treat sinus infection and ear infection. You must take the full course of this  medication.  Tussionex at night as needed for cough. Do not drive while taking this medication as it will inhibit your ability to operate a motor vehicle.  Plain Over the Counter Mucinex (NOT Mucinex D) for thick secretions  Force NON dairy fluids, drinking plenty of water is best.    Over the Counter Flonase OR Nasacort AQ 1 spray in each nostril twice a day as needed. Use the "crossover" technique into opposite nostril spraying toward opposite ear @ 45 degree angle, not straight up into nostril.   Plain Over the Counter Allegra (NOT D )  160 daily , OR Loratidine 10 mg , OR Zyrtec 10 mg @ bedtime  as needed for itchy eyes & sneezing.  Saline Irrigation and Saline Sprays can also help reduce symptoms.   If emergency symptoms discussed during visit developed, seek medical attention immediately.  Followup as needed, or for worsening or persistent symptoms despite treatment.

## 2013-10-13 NOTE — Patient Instructions (Addendum)
Bactrim DS twice daily for 10 days to treat sinus infection and ear infection. You must take the full course of this medication.  Tussionex at night as needed for cough. Do not drive while taking this medication as it will inhibit your ability to operate a motor vehicle.  Plain Over the Counter Mucinex (NOT Mucinex D) for thick secretions  Force NON dairy fluids, drinking plenty of water is best.    Over the Counter Flonase OR Nasacort AQ 1 spray in each nostril twice a day as needed. Use the "crossover" technique into opposite nostril spraying toward opposite ear @ 45 degree angle, not straight up into nostril.   Plain Over the Counter Allegra (NOT D )  160 daily , OR Loratidine 10 mg , OR Zyrtec 10 mg @ bedtime  as needed for itchy eyes & sneezing.  Saline Irrigation and Saline Sprays can also help reduce symptoms.   If emergency symptoms discussed during visit developed, seek medical attention immediately.  Followup as needed, or for worsening or persistent symptoms despite treatment.    Otitis Media Otitis media is redness, soreness, and puffiness (swelling) in the space just behind your eardrum (middle ear). It may be caused by allergies or infection. It often happens along with a cold. HOME CARE  Take your medicine as told. Finish it even if you start to feel better.  Only take over-the-counter or prescription medicines for pain, discomfort, or fever as told by your doctor.  Follow up with your doctor as told. GET HELP IF:  You have otitis media only in one ear, or bleeding from your nose, or both.  You notice a lump on your neck.  You are not getting better in 3-5 days.  You feel worse instead of better. GET HELP RIGHT AWAY IF:   You have pain that is not helped with medicine.  You have puffiness, redness, or pain around your ear.  You get a stiff neck.  You cannot move part of your face (paralysis).  You notice that the bone behind your ear hurts when you  touch it. MAKE SURE YOU:   Understand these instructions.  Will watch your condition.  Will get help right away if you are not doing well or get worse. Document Released: 07/22/2007 Document Revised: 02/07/2013 Document Reviewed: 08/30/2012 [facility] Patient Information 2015 [place], [place]. This information is not intended to replace advice given to you by your health care provider. Make sure you discuss any questions you have with your health care provider. Sinusitis Sinusitis is redness, soreness, and puffiness (inflammation) of the air pockets in the bones of your face (sinuses). The redness, soreness, and puffiness can cause air and mucus to get trapped in your sinuses. This can allow germs to grow and cause an infection.  HOME CARE   Drink enough fluids to keep your pee (urine) clear or pale yellow.  Use a humidifier in your home.  Run a hot shower to create steam in the bathroom. Sit in the bathroom with the door closed. Breathe in the steam 3-4 times a day.  Put a warm, moist washcloth on your face 3-4 times a day, or as told by your doctor.  Use salt water sprays (saline sprays) to wet the thick fluid in your nose. This can help the sinuses drain.  Only take medicine as told by your doctor. GET HELP RIGHT AWAY IF:   Your pain gets worse.  You have very bad headaches.  You are sick to your stomach (  nauseous).  You throw up (vomit).  You are very sleepy (drowsy) all the time.  Your face is puffy (swollen).  Your vision changes.  You have a stiff neck.  You have trouble breathing. MAKE SURE YOU:   Understand these instructions.  Will watch your condition.  Will get help right away if you are not doing well or get worse. Document Released: 07/22/2007 Document Revised: 10/28/2011 Document Reviewed: 09/08/2011 [facility] Patient Information 2015 [place], [place]. This information is not intended to replace advice given to you by your health care provider. Make sure  you discuss any questions you have with your health care provider.

## 2013-11-16 ENCOUNTER — Ambulatory Visit (INDEPENDENT_AMBULATORY_CARE_PROVIDER_SITE_OTHER): Payer: Medicare Other | Admitting: Family Medicine

## 2013-11-16 ENCOUNTER — Encounter: Payer: Self-pay | Admitting: Family Medicine

## 2013-11-16 DIAGNOSIS — Z79899 Other long term (current) drug therapy: Secondary | ICD-10-CM

## 2013-11-16 DIAGNOSIS — F32A Depression, unspecified: Secondary | ICD-10-CM

## 2013-11-16 DIAGNOSIS — I10 Essential (primary) hypertension: Secondary | ICD-10-CM

## 2013-11-16 DIAGNOSIS — F329 Major depressive disorder, single episode, unspecified: Secondary | ICD-10-CM | POA: Insufficient documentation

## 2013-11-16 LAB — CBC WITH DIFFERENTIAL/PLATELET
BASOS ABS: 0.1 10*3/uL (ref 0.0–0.1)
Basophils Relative: 1.3 % (ref 0.0–3.0)
EOS ABS: 0.1 10*3/uL (ref 0.0–0.7)
Eosinophils Relative: 2.1 % (ref 0.0–5.0)
HEMATOCRIT: 37.7 % (ref 36.0–46.0)
HEMOGLOBIN: 12.5 g/dL (ref 12.0–15.0)
LYMPHS ABS: 1.6 10*3/uL (ref 0.7–4.0)
Lymphocytes Relative: 25.4 % (ref 12.0–46.0)
MCHC: 33.2 g/dL (ref 30.0–36.0)
MCV: 96.8 fl (ref 78.0–100.0)
MONO ABS: 0.5 10*3/uL (ref 0.1–1.0)
Monocytes Relative: 7.7 % (ref 3.0–12.0)
NEUTROS ABS: 4 10*3/uL (ref 1.4–7.7)
Neutrophils Relative %: 63.5 % (ref 43.0–77.0)
PLATELETS: 320 10*3/uL (ref 150.0–400.0)
RBC: 3.9 Mil/uL (ref 3.87–5.11)
RDW: 13.6 % (ref 11.5–15.5)
WBC: 6.3 10*3/uL (ref 4.0–10.5)

## 2013-11-16 LAB — BASIC METABOLIC PANEL
BUN: 13 mg/dL (ref 6–23)
CALCIUM: 9.1 mg/dL (ref 8.4–10.5)
CHLORIDE: 105 meq/L (ref 96–112)
CO2: 27 mEq/L (ref 19–32)
Creatinine, Ser: 0.6 mg/dL (ref 0.4–1.2)
GFR: 114.02 mL/min (ref >60.00)
Glucose, Bld: 80 mg/dL (ref 70–99)
Potassium: 3.7 mEq/L (ref 3.5–5.1)
Sodium: 138 mEq/L (ref 135–145)

## 2013-11-16 LAB — HEPATIC FUNCTION PANEL
ALT: 29 U/L (ref 0–35)
AST: 16 U/L (ref 0–37)
Albumin: 4 g/dL (ref 3.5–5.2)
Alkaline Phosphatase: 63 U/L (ref 39–117)
BILIRUBIN DIRECT: 0.1 mg/dL (ref 0.0–0.3)
BILIRUBIN TOTAL: 0.7 mg/dL (ref 0.2–1.2)
Total Protein: 7.4 g/dL (ref 6.0–8.3)

## 2013-11-16 LAB — TSH: TSH: 0.74 u[IU]/mL (ref 0.35–4.50)

## 2013-11-16 MED ORDER — CITALOPRAM HYDROBROMIDE 20 MG PO TABS: ORAL_TABLET | ORAL | Status: DC

## 2013-11-16 MED ORDER — LISINOPRIL 10 MG PO TABS
10.0000 mg | ORAL_TABLET | Freq: Every morning | ORAL | Status: DC
Start: 2013-11-16 — End: 2014-12-13

## 2013-11-16 NOTE — Progress Notes (Signed)
Pre visit review using our clinic review tool, if applicable. No additional management support is needed unless otherwise documented below in the visit note. 

## 2013-11-16 NOTE — Patient Instructions (Signed)
Continue your current medications  Followup in 1 year sooner if any problems 

## 2013-11-16 NOTE — Progress Notes (Signed)
   Subjective:    Patient ID: Tonya Bartlett, female    DOB: 07/13/1945, 68 y.o.   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 She also takes lisinopril 10 mg daily for hypertension BP 124/84.  Her biggest problem continues to be her weight and arthritis. Weight is 212 pounds  She is followed in rheumatology because of psoriatic arthritis. She was taking methotrexate and may indeed go back on it. She's to go back to see a rheumatologist today. She just finished a Dosepak of prednisone. She's had both knees replaced.  She gets routine eye care, dental care, BSE monthly, and you mammography, colonoscopy every 10 years have been normal.  She had her uterus removed in 1976 for dysfunctional uterine bleeding. Ovaries were left intact. GYN-wise she is asymptomatic therefore pelvic not indicated   Review of Systems  Constitutional: Negative.   HENT: Negative.   Eyes: Negative.   Respiratory: Negative.   Cardiovascular: Negative.   Gastrointestinal: Negative.   Genitourinary: Negative.   Musculoskeletal: Negative.   Neurological: Negative.   Psychiatric/Behavioral: Negative.        Objective:   Physical Exam  Constitutional: She is oriented to person, place, and time. She appears well-developed and well-nourished.  HENT:  Head: Normocephalic and atraumatic.  Right Ear: External ear normal.  Left Ear: External ear normal.  Nose: Nose normal.  Mouth/Throat: Oropharynx is clear and moist.  Eyes: EOM are normal. Pupils are equal, round, and reactive to light.  Neck: Normal range of motion. Neck supple. No thyromegaly present.  Cardiovascular: Normal rate, regular rhythm, normal heart sounds and intact distal pulses.  Exam reveals no gallop and no friction rub.   No murmur heard. Pulmonary/Chest: Effort normal and breath sounds  normal.  Abdominal: Soft. Bowel sounds are normal. She exhibits no distension and no mass. There is no tenderness. There is no rebound.  Genitourinary:  Bilateral breast exam normal  Musculoskeletal: Normal range of motion.  Scar right and left knee from previous TKR  Lymphadenopathy:    She has no cervical adenopathy.  Neurological: She is alert and oriented to person, place, and time. She has normal reflexes. No cranial nerve deficit. She exhibits normal muscle tone. Coordination normal.  Skin: Skin is warm and dry.  Total body skin exam normal except for multiple seborrheic keratosis she also has a new one it's been increasing in size in her left ear canal. Advised to see her dermatologist to consider removal  Psychiatric: She has a normal mood and affect. Her behavior is normal. Judgment and thought content normal.          Assessment & Plan:  Healthy female  Overweight.......... continue to encourage diet exercise and weight loss  Psoriatic arthritis......... followed by rheumatology  History of mild depression.......... continue Celexa 20 mg daily  History of hypertension at goal........... continue lisinopril 10 mg daily

## 2013-11-17 ENCOUNTER — Telehealth: Payer: Self-pay | Admitting: Family Medicine

## 2013-11-17 NOTE — Telephone Encounter (Signed)
emmi emailed °

## 2013-11-22 ENCOUNTER — Other Ambulatory Visit: Payer: Self-pay | Admitting: *Deleted

## 2013-11-23 ENCOUNTER — Ambulatory Visit: Payer: Medicare Other | Admitting: *Deleted

## 2014-01-02 ENCOUNTER — Telehealth: Payer: Self-pay | Admitting: *Deleted

## 2014-01-02 ENCOUNTER — Ambulatory Visit (INDEPENDENT_AMBULATORY_CARE_PROVIDER_SITE_OTHER): Payer: Medicare Other | Admitting: *Deleted

## 2014-01-02 ENCOUNTER — Other Ambulatory Visit: Payer: Self-pay

## 2014-01-02 DIAGNOSIS — Z1231 Encounter for screening mammogram for malignant neoplasm of breast: Secondary | ICD-10-CM

## 2014-01-02 DIAGNOSIS — Z23 Encounter for immunization: Secondary | ICD-10-CM

## 2014-01-02 MED ORDER — HYDROCODONE-ACETAMINOPHEN 7.5-325 MG PO TABS: 1.0000 | ORAL_TABLET | Freq: Two times a day (BID) | ORAL | Status: DC

## 2014-01-03 NOTE — Telephone Encounter (Signed)
Rx ready for pick up and patient is aware 

## 2014-01-19 ENCOUNTER — Telehealth: Payer: Self-pay | Admitting: Family Medicine

## 2014-01-19 NOTE — Telephone Encounter (Signed)
Pt request refill of the following: METHYLPHENIDATE RITALIN 10MG    Pt said she tried to go with out but has realize that she can not    Phamacy: pick up

## 2014-01-22 ENCOUNTER — Ambulatory Visit
Admission: RE | Admit: 2014-01-22 | Discharge: 2014-01-22 | Disposition: A | Discharge status: Home / Self Care | Payer: Medicare Other | Source: Ambulatory Visit

## 2014-01-22 ENCOUNTER — Other Ambulatory Visit: Payer: Self-pay | Admitting: *Deleted

## 2014-01-22 DIAGNOSIS — Z1231 Encounter for screening mammogram for malignant neoplasm of breast: Secondary | ICD-10-CM

## 2014-01-22 MED ORDER — METHYLPHENIDATE HCL 20 MG PO TABS: 20.0000 mg | ORAL_TABLET | Freq: Two times a day (BID) | ORAL | Status: DC

## 2014-01-22 NOTE — Telephone Encounter (Signed)
Rx ready for pick up. 

## 2014-02-16 HISTORY — PX: EYE SURGERY: SHX253

## 2014-02-19 ENCOUNTER — Other Ambulatory Visit: Payer: Self-pay | Admitting: Dermatology

## 2014-02-19 DIAGNOSIS — L820 Inflamed seborrheic keratosis: Secondary | ICD-10-CM | POA: Diagnosis not present

## 2014-02-19 DIAGNOSIS — Z808 Family history of malignant neoplasm of other organs or systems: Secondary | ICD-10-CM | POA: Diagnosis not present

## 2014-02-19 DIAGNOSIS — L821 Other seborrheic keratosis: Secondary | ICD-10-CM | POA: Diagnosis not present

## 2014-03-16 DIAGNOSIS — L4050 Arthropathic psoriasis, unspecified: Secondary | ICD-10-CM | POA: Diagnosis not present

## 2014-03-16 DIAGNOSIS — M5137 Other intervertebral disc degeneration, lumbosacral region: Secondary | ICD-10-CM | POA: Diagnosis not present

## 2014-03-16 DIAGNOSIS — M150 Primary generalized (osteo)arthritis: Secondary | ICD-10-CM | POA: Diagnosis not present

## 2014-03-16 DIAGNOSIS — M542 Cervicalgia: Secondary | ICD-10-CM | POA: Diagnosis not present

## 2014-04-04 ENCOUNTER — Ambulatory Visit (INDEPENDENT_AMBULATORY_CARE_PROVIDER_SITE_OTHER): Payer: Medicare Other | Admitting: Family Medicine

## 2014-04-04 ENCOUNTER — Encounter: Payer: Self-pay | Admitting: Family Medicine

## 2014-04-04 VITALS — BP 128/90 | HR 68 | Temp 98.2°F | Wt 220.0 lb

## 2014-04-04 DIAGNOSIS — G5622 Lesion of ulnar nerve, left upper limb: Secondary | ICD-10-CM | POA: Diagnosis not present

## 2014-04-04 NOTE — Patient Instructions (Signed)
We will call you regarding EMG studies to help evaluate your left hand weakness

## 2014-04-04 NOTE — Progress Notes (Addendum)
Subjective:    Patient ID: Tonya Bartlett, female    DOB: [birth year], 69 y.o.   MRN: [id number]  HPI Patient is a work in with numbness and tingling mostly involving her left hand fourth and fifth digits but to some extent third digit. She has had this for about 2 months. She has some neck pain but this is mostly right lower cervical region and not left-sided. Denies any injury. She has in recent weeks noticed some weakness left upper extremity and some interosseous muscle wasting past couple of weeks. She has had recently noted some difficulties with grip on the left side. Denies any lower extremity symptoms. No speech changes or facial weakness. Denies any elbow pain. She has occasional sharp pains left upper extremity which radiate from the hand up toward the shoulder region.  Past Medical History  Diagnosis Date  . OA (osteoarthritis)     RIGHT SHOULDER  . Right rotator cuff tear   . Hyperlipidemia   . Allergic rhinitis   . Unspecified essential hypertension   . Migraine headache   . 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 History of iron deficiency anemia   . History of migraine headaches   . Frequency of urination   . Recovering alcoholic     SINCE [id number]   Past Surgical History  Procedure Laterality Date  . Tonsillectomy and adenoidectomy  AGE 3  . Vaginal hysterectomy  1976  . Total knee arthroplasty Bilateral LEFT  1996/   RIGHT 2004  . Replacement total knee Left 2006  . Knee arthroscopy w/ meniscectomy Bilateral X2  LEFT /    X1  RIGHT  . Cataract extraction w/ intraocular lens  implant, bilateral    . Bunionectomy/  hammertoe correction  right foot  2011  . Shoulder arthroscopy with subacromial decompression, rotator cuff repair and bicep tendon repair Right 05/23/2013    Procedure: RIGHT SHOULDER ARTHROSCOPY EXAM UNDER ANESTHESIA  WITH SUBACROMIAL DECOMPRESSION,DISTAL CLAVICLE RESECTION, SADLABRAL DEBRIDEMENT CHONDROPLASTY, BICEP  TENOTOMY ;  Surgeon: Sydnee Cabal, MD;  Location: [facility];  Service: Orthopedics;  Laterality: Right;    reports that she quit smoking about 28 years ago. Her smoking use included Cigarettes. She has a 7.5 pack-year smoking history. She has never used smokeless tobacco. She reports that she does not drink alcohol or use illicit drugs. family history includes Cancer in her sister; Heart disease (age of onset: 28) in her father; Hypertension in her father. Allergies  Allergen Reactions  . Penicillins Anaphylaxis  . Erythromycin Other (See Comments)    SEVERE STOMACH CRAMPS  . Morphine And Related Nausea And Vomiting  . Nsaids Other (See Comments)    SEVERE STOMACH CRAMPS  . Nickel Rash      Review of Systems  Constitutional: Negative for fever, chills, appetite change and unexpected weight change.  Respiratory: Negative for shortness of breath.   Cardiovascular: Negative for chest pain.  Musculoskeletal: Positive for neck pain.  Neurological: Positive for weakness and numbness. Negative for tremors.  Hematological: Negative for adenopathy.       Objective:   Physical Exam  Constitutional: She is oriented to person, place, and time. She appears well-developed and well-nourished.  Cardiovascular: Normal rate and regular rhythm.   Pulmonary/Chest: Effort normal and breath sounds normal. No respiratory distress. She has no wheezes. She has no rales.  Musculoskeletal: She exhibits no edema.  She has some obvious interosseous wasting  of the left hand compared to the right hand Left elbow non-tender and full ROM.  Neurological: She is alert and oriented to person, place, and time.  Patient has weakness with hand grip on the left compared to the right. She cannot oppose the thumb and fifth digit against resistance. She has inability to fully adduct her fifth digit to the hand and weakness of abduction 5 th digit.. She has subjective sensory deficits left fourth and  fifth digits          Assessment & Plan:  Left hand numbness and weakness. She is presenting with ulnar neuropathy. Set up EMG studies and cervical spine films. She does not have any elbow pain or history of compression at the elbow. We explained this is likely related to peripheral involvement of the nerve but with her neck pain rule out cervical involvement. May need MRI cervical spine to further clarify. Start with plain films.  EMG confirms severe left ulnar neuropathy.  No evidence for cervical involvement and appears to be below (distal to) elbow.  Will set up ortho referral to Dr Amedeo Plenty.

## 2014-04-04 NOTE — Progress Notes (Signed)
Pre visit review using our clinic review tool, if applicable. No additional management support is needed unless otherwise documented below in the visit note. 

## 2014-04-06 ENCOUNTER — Ambulatory Visit (INDEPENDENT_AMBULATORY_CARE_PROVIDER_SITE_OTHER)
Admission: RE | Admit: 2014-04-06 | Discharge: 2014-04-06 | Disposition: A | Discharge status: Home / Self Care | Payer: Medicare Other | Source: Ambulatory Visit | Attending: Family Medicine | Admitting: Family Medicine

## 2014-04-06 ENCOUNTER — Telehealth: Payer: Self-pay | Admitting: Family Medicine

## 2014-04-06 DIAGNOSIS — G5622 Lesion of ulnar nerve, left upper limb: Secondary | ICD-10-CM | POA: Diagnosis not present

## 2014-04-06 DIAGNOSIS — M47812 Spondylosis without myelopathy or radiculopathy, cervical region: Secondary | ICD-10-CM | POA: Diagnosis not present

## 2014-04-06 DIAGNOSIS — M5032 Other cervical disc degeneration, mid-cervical region: Secondary | ICD-10-CM | POA: Diagnosis not present

## 2014-04-06 NOTE — Telephone Encounter (Signed)
Pt saw dr Elease Hashimoto 2/17 and he ordered a xray.  Pt states dr told her there is another test she might need to do, but pt has not heard from anyone concerning this test. Pt would like a cb about the other test/

## 2014-04-06 NOTE — Telephone Encounter (Signed)
EMG has been ordered and is scheduled with Neurology.  I have already placed order.   If she has not heard back by next week let us know.

## 2014-04-06 NOTE — Telephone Encounter (Signed)
Patient informed of the message below and stated she called the neurologist's office and they told her they could not do a test and that she needs a referral first?  Message forwarded to Neoma Laming to check on this.

## 2014-04-09 ENCOUNTER — Telehealth: Payer: Self-pay

## 2014-04-09 ENCOUNTER — Other Ambulatory Visit: Payer: Self-pay | Admitting: Family Medicine

## 2014-04-09 DIAGNOSIS — M542 Cervicalgia: Secondary | ICD-10-CM

## 2014-04-09 DIAGNOSIS — R29898 Other symptoms and signs involving the musculoskeletal system: Secondary | ICD-10-CM

## 2014-04-09 NOTE — Telephone Encounter (Signed)
Yes.  We already ordered EMG.  Are they requesting order for neuro consult?  OK if they wish to see her.

## 2014-04-09 NOTE — Telephone Encounter (Signed)
Referral is ordered

## 2014-04-09 NOTE — Telephone Encounter (Signed)
Pt has appointment for a EMG on Thursday. Pt states that Neurology office is still asking for Referral is okay to order.

## 2014-04-10 NOTE — Telephone Encounter (Signed)
Already fax this over

## 2014-04-12 ENCOUNTER — Ambulatory Visit (INDEPENDENT_AMBULATORY_CARE_PROVIDER_SITE_OTHER): Payer: Medicare Other | Admitting: Neurology

## 2014-04-12 DIAGNOSIS — G5622 Lesion of ulnar nerve, left upper limb: Secondary | ICD-10-CM

## 2014-04-12 NOTE — Procedures (Signed)
[facility]  [address], [address]  [place], [place] [address] Tel: [phone] Fax:  [phone] Test Date:  04/12/2014  Patient: Tonya Bartlett DOB: 1945/09/05 Physician: Narda Amber, DO  Sex: Female Height: 5\' 6"  Ref Phys: Macon Large  ID#: [id number] Temp: 37.0C Technician:    Patient Complaints: This is a 69 year-old female presenting for evaluation of left hand weakness and paresthesias, involving the last three digits.  NCV & EMG Findings: Extensive electrodiagnostic testing of the left upper extremity shows:  1. Left ulnar and dorsal ulnar cutaneous sensory responses are absent. Left median and radial sensory responses are within normal limits. 2. Left motor response recording at the abductor digiti minimi shows markedly reduced amplitude, with conduction block and slowing in the forearm. Ulnar motor response at the first dorsal interosseous muscle is also reduced. Left median motor response is within normal limits. 3. Active on chronic motor axon loss changes are seen affecting the left first dorsal interosseous and abductor digiti minimi muscles, and to a lesser degree chronic motor axon loss changes are seen affecting the flexor carpi ulnaris and flexor digitorum profundus 4 and 5 muscles.   Impression: 1. There is electrophysiologic evidence of severe and subacute ulnar neuropathy at or proximal to the takeoff of the flexor carpi ulnaris muscle in the forearm. 2. There is no evidence of a cervical radiculopathy affecting the left upper extremity.    ___________________________ Narda Amber, DO    Nerve Conduction Studies Anti Sensory Summary Table   Site NR Peak (ms) Norm Peak (ms) P-T Amp (V) Norm P-T Amp  Left DorsCutan Anti Sensory (Dorsum 5th MC)  Wrist NR  <3.2  >5  Left Median Anti Sensory (2nd Digit)  Wrist    2.8 <3.8 37.6 >10  Left Radial Anti Sensory (Base 1st Digit)  Wrist    2.3 <2.8 17.5 >10  Left Ulnar Anti Sensory (5th Digit)    Wrist NR  <3.2  >5   Motor Summary Table   Site NR Onset (ms) Norm Onset (ms) O-P Amp (mV) Norm O-P Amp Site1 Site2 Delta-0 (ms) Dist (cm) Vel (m/s) Norm Vel (m/s)  Left Median Motor (Abd Poll Brev)  Wrist    3.4 <4.0 5.3 >5 Elbow Wrist 4.6 26.0 57 >50  Elbow    8.0  4.9         Left Ulnar Motor (Abd Dig Minimi)  Wrist    2.8 <3.1 1.3 >7 B Elbow Wrist 5.8 24.0 41 >50  B Elbow    8.6  0.3  A Elbow B Elbow 3.0 10.0 33 >50  A Elbow    11.6  0.3         Left Ulnar (FDI) Motor (1st DI)  Wrist    2.6 <4.5 0.5 >7         EMG   Side Muscle Ins Act Fibs Psw Fasc Number Recrt Dur Dur. Amp Amp. Poly Poly. Comment  Left 1stDorInt Nml 2+ Nml Nml None None - - - - - - N/A  Left ABD Dig Min Nml 1+ Nml Nml SMU Rapid Few 1+ Nml Nml Nml Nml N/A  Left Ext Indicis Nml Nml Nml Nml Nml Nml Nml Nml Nml Nml Nml Nml N/A  Left FlexCarpiUln Nml Nml Nml Nml 2- Rapid Some 1+ Some 1+ Nml Nml N/A  Left FlexDigProf 4,5 Nml Nml Nml Nml 2- Rapid Some 1+ Some 1+ Nml Nml N/A  Left PronatorTeres Nml Nml Nml Nml Nml Nml Nml  Nml Nml Nml Nml Nml N/A  Left Biceps Nml Nml Nml Nml Nml Nml Nml Nml Nml Nml Nml Nml N/A  Left Triceps Nml Nml Nml Nml Nml Nml Nml Nml Nml Nml Nml Nml N/A  Left Cervical PSP Nml Nml Nml Nml Nml Nml Nml Nml Nml Nml Nml Nml N/A     Waveforms:

## 2014-04-13 DIAGNOSIS — G5622 Lesion of ulnar nerve, left upper limb: Secondary | ICD-10-CM | POA: Insufficient documentation

## 2014-04-13 NOTE — Addendum Note (Signed)
Addended by: Eulas Post on: 04/13/2014 07:10 AM   Modules accepted: Orders

## 2014-04-19 DIAGNOSIS — M79642 Pain in left hand: Secondary | ICD-10-CM | POA: Diagnosis not present

## 2014-04-19 DIAGNOSIS — M25522 Pain in left elbow: Secondary | ICD-10-CM | POA: Diagnosis not present

## 2014-04-19 DIAGNOSIS — G5622 Lesion of ulnar nerve, left upper limb: Secondary | ICD-10-CM | POA: Diagnosis not present

## 2014-04-24 DIAGNOSIS — M25522 Pain in left elbow: Secondary | ICD-10-CM | POA: Diagnosis not present

## 2014-04-24 DIAGNOSIS — M25532 Pain in left wrist: Secondary | ICD-10-CM | POA: Diagnosis not present

## 2014-04-24 DIAGNOSIS — M79622 Pain in left upper arm: Secondary | ICD-10-CM | POA: Diagnosis not present

## 2014-04-25 DIAGNOSIS — G5622 Lesion of ulnar nerve, left upper limb: Secondary | ICD-10-CM | POA: Diagnosis not present

## 2014-04-30 DIAGNOSIS — M79642 Pain in left hand: Secondary | ICD-10-CM | POA: Diagnosis not present

## 2014-04-30 DIAGNOSIS — M25522 Pain in left elbow: Secondary | ICD-10-CM | POA: Diagnosis not present

## 2014-04-30 DIAGNOSIS — G5622 Lesion of ulnar nerve, left upper limb: Secondary | ICD-10-CM | POA: Diagnosis not present

## 2014-05-07 DIAGNOSIS — M5137 Other intervertebral disc degeneration, lumbosacral region: Secondary | ICD-10-CM | POA: Diagnosis not present

## 2014-05-07 DIAGNOSIS — L4050 Arthropathic psoriasis, unspecified: Secondary | ICD-10-CM | POA: Diagnosis not present

## 2014-05-07 DIAGNOSIS — M542 Cervicalgia: Secondary | ICD-10-CM | POA: Diagnosis not present

## 2014-05-07 DIAGNOSIS — M150 Primary generalized (osteo)arthritis: Secondary | ICD-10-CM | POA: Diagnosis not present

## 2014-05-18 ENCOUNTER — Telehealth: Payer: Self-pay | Admitting: *Deleted

## 2014-05-18 NOTE — Telephone Encounter (Signed)
Patient calling requesting a refill of Ritalin.

## 2014-05-21 MED ORDER — METHYLPHENIDATE HCL 20 MG PO TABS: 20.0000 mg | ORAL_TABLET | Freq: Two times a day (BID) | ORAL | Status: DC

## 2014-05-21 NOTE — Telephone Encounter (Signed)
Rx ready for pick up and patient is aware 

## 2014-05-22 DIAGNOSIS — G5622 Lesion of ulnar nerve, left upper limb: Secondary | ICD-10-CM | POA: Diagnosis not present

## 2014-05-22 DIAGNOSIS — M5412 Radiculopathy, cervical region: Secondary | ICD-10-CM | POA: Diagnosis not present

## 2014-05-22 DIAGNOSIS — M4722 Other spondylosis with radiculopathy, cervical region: Secondary | ICD-10-CM | POA: Diagnosis not present

## 2014-05-22 DIAGNOSIS — M542 Cervicalgia: Secondary | ICD-10-CM | POA: Diagnosis not present

## 2014-05-22 DIAGNOSIS — M5030 Other cervical disc degeneration, unspecified cervical region: Secondary | ICD-10-CM | POA: Diagnosis not present

## 2014-05-22 DIAGNOSIS — Z6836 Body mass index (BMI) 36.0-36.9, adult: Secondary | ICD-10-CM | POA: Diagnosis not present

## 2014-06-11 DIAGNOSIS — G5622 Lesion of ulnar nerve, left upper limb: Secondary | ICD-10-CM | POA: Diagnosis not present

## 2014-06-11 DIAGNOSIS — M79642 Pain in left hand: Secondary | ICD-10-CM | POA: Diagnosis not present

## 2014-06-19 DIAGNOSIS — G8918 Other acute postprocedural pain: Secondary | ICD-10-CM | POA: Diagnosis not present

## 2014-06-19 DIAGNOSIS — M62522 Muscle wasting and atrophy, not elsewhere classified, left upper arm: Secondary | ICD-10-CM | POA: Diagnosis not present

## 2014-06-19 DIAGNOSIS — G5622 Lesion of ulnar nerve, left upper limb: Secondary | ICD-10-CM | POA: Diagnosis not present

## 2014-06-28 DIAGNOSIS — G5622 Lesion of ulnar nerve, left upper limb: Secondary | ICD-10-CM | POA: Diagnosis not present

## 2014-06-29 DIAGNOSIS — G5622 Lesion of ulnar nerve, left upper limb: Secondary | ICD-10-CM | POA: Diagnosis not present

## 2014-06-29 DIAGNOSIS — M79642 Pain in left hand: Secondary | ICD-10-CM | POA: Diagnosis not present

## 2014-06-29 DIAGNOSIS — Z4789 Encounter for other orthopedic aftercare: Secondary | ICD-10-CM | POA: Diagnosis not present

## 2014-07-02 DIAGNOSIS — Z4789 Encounter for other orthopedic aftercare: Secondary | ICD-10-CM | POA: Diagnosis not present

## 2014-07-03 DIAGNOSIS — M25522 Pain in left elbow: Secondary | ICD-10-CM | POA: Diagnosis not present

## 2014-07-13 DIAGNOSIS — M25522 Pain in left elbow: Secondary | ICD-10-CM | POA: Diagnosis not present

## 2014-07-13 DIAGNOSIS — M19011 Primary osteoarthritis, right shoulder: Secondary | ICD-10-CM | POA: Diagnosis not present

## 2014-07-20 DIAGNOSIS — M25522 Pain in left elbow: Secondary | ICD-10-CM | POA: Diagnosis not present

## 2014-07-27 DIAGNOSIS — M25522 Pain in left elbow: Secondary | ICD-10-CM | POA: Diagnosis not present

## 2014-07-30 DIAGNOSIS — M79642 Pain in left hand: Secondary | ICD-10-CM | POA: Diagnosis not present

## 2014-07-31 DIAGNOSIS — M25522 Pain in left elbow: Secondary | ICD-10-CM | POA: Diagnosis not present

## 2014-08-06 DIAGNOSIS — M5137 Other intervertebral disc degeneration, lumbosacral region: Secondary | ICD-10-CM | POA: Diagnosis not present

## 2014-08-06 DIAGNOSIS — M150 Primary generalized (osteo)arthritis: Secondary | ICD-10-CM | POA: Diagnosis not present

## 2014-08-06 DIAGNOSIS — M542 Cervicalgia: Secondary | ICD-10-CM | POA: Diagnosis not present

## 2014-08-06 DIAGNOSIS — L4050 Arthropathic psoriasis, unspecified: Secondary | ICD-10-CM | POA: Diagnosis not present

## 2014-08-29 DIAGNOSIS — M25522 Pain in left elbow: Secondary | ICD-10-CM | POA: Diagnosis not present

## 2014-09-03 ENCOUNTER — Telehealth: Payer: Self-pay | Admitting: Family Medicine

## 2014-09-03 DIAGNOSIS — F32A Depression, unspecified: Secondary | ICD-10-CM

## 2014-09-03 DIAGNOSIS — F329 Major depressive disorder, single episode, unspecified: Secondary | ICD-10-CM

## 2014-09-03 MED ORDER — CITALOPRAM HYDROBROMIDE 20 MG PO TABS: ORAL_TABLET | ORAL | Status: DC

## 2014-09-03 NOTE — Telephone Encounter (Signed)
Pt request refill of the following:   citalopram (CELEXA) 20 MG tablet   Phamacy:  [facility] [address]

## 2014-09-05 ENCOUNTER — Ambulatory Visit (INDEPENDENT_AMBULATORY_CARE_PROVIDER_SITE_OTHER): Payer: Medicare Other | Admitting: Adult Health

## 2014-09-05 ENCOUNTER — Encounter: Payer: Self-pay | Admitting: Adult Health

## 2014-09-05 VITALS — BP 160/100 | Temp 98.8°F | Ht 66.25 in | Wt 219.0 lb

## 2014-09-05 DIAGNOSIS — F329 Major depressive disorder, single episode, unspecified: Secondary | ICD-10-CM | POA: Diagnosis not present

## 2014-09-05 DIAGNOSIS — F32A Depression, unspecified: Secondary | ICD-10-CM

## 2014-09-05 MED ORDER — CITALOPRAM HYDROBROMIDE 40 MG PO TABS: 40.0000 mg | ORAL_TABLET | Freq: Every day | ORAL | Status: DC

## 2014-09-05 NOTE — Progress Notes (Signed)
Pre visit review using our clinic review tool, if applicable. No additional management support is needed unless otherwise documented below in the visit note. 

## 2014-09-05 NOTE — Patient Instructions (Signed)
It was a pleasure meeting you today!  I have sent in a prescription for Celexa 40mg . Please let me know if there is no improvement in your mood in approx 3 weeks.   Have a great [birth year]!

## 2014-09-05 NOTE — Progress Notes (Addendum)
Subjective:    Patient ID: Tonya Bartlett, female    DOB: Sep 15, 1945, 69 y.o.   MRN: [id number]  HPI  69 year old female who presents to the office for follow up regarding her Celexa prescription. Over the last few months ( Feb-March) she feels as though her mood has been going down, she no longer feels like she wants to shower and do things that she enjoys. She has recently started doubling up her medication for the last month and noticed a slight change. She would like to change another anti depression medications. Denies any SI or self harm. She endorses running out over the weekend and had periods of crying spells.   She would like some "moles" checked out.   Review of Systems  Constitutional: Negative.   Respiratory: Negative.   Cardiovascular: Negative.   Gastrointestinal: Negative.   Endocrine: Negative.   Musculoskeletal: Negative.   Neurological: Negative.   Psychiatric/Behavioral: Negative.    Past Medical History  Diagnosis Date  . OA (osteoarthritis)     RIGHT SHOULDER  . Right rotator cuff tear   . Hyperlipidemia   . Allergic rhinitis   . Unspecified essential hypertension   . Migraine headache   . 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 History of iron deficiency anemia   . History of migraine headaches   . Frequency of urination   . Recovering alcoholic     SINCE [id number]    History   Social History  . Marital Status: Married    Spouse Name: N/A  . Number of Children: N/A  . Years of Education: N/A   Occupational History  . Not on file.   Social History Main Topics  . Smoking status: Former Smoker -- 0.50 packs/day for 15 years    Types: Cigarettes    Quit date: 05/15/1985  . Smokeless tobacco: Never Used  . Alcohol Use: No     Comment: RECOVERING ALCOHOLIC--  QUIT [id number]  . Drug Use: No  . Sexual Activity: Not on file   Other Topics Concern  . Not on file   Social History Narrative    Past Surgical  History  Procedure Laterality Date  . Tonsillectomy and adenoidectomy  AGE 64  . Vaginal hysterectomy  1976  . Total knee arthroplasty Bilateral LEFT  1996/   RIGHT 2004  . Replacement total knee Left 2006  . Knee arthroscopy w/ meniscectomy Bilateral X2  LEFT /    X1  RIGHT  . Cataract extraction w/ intraocular lens  implant, bilateral    . Bunionectomy/  hammertoe correction  right foot  2011  . Shoulder arthroscopy with subacromial decompression, rotator cuff repair and bicep tendon repair Right 05/23/2013    Procedure: RIGHT SHOULDER ARTHROSCOPY EXAM UNDER ANESTHESIA  WITH SUBACROMIAL DECOMPRESSION,DISTAL CLAVICLE RESECTION, SADLABRAL DEBRIDEMENT CHONDROPLASTY, BICEP TENOTOMY ;  Surgeon: Sydnee Cabal, MD;  Location: [facility];  Service: Orthopedics;  Laterality: Right;    Family History  Problem Relation Age of Onset  . Heart disease Father 33  . Hypertension Father   . Cancer Sister     skin CA, 2 sisters    Allergies  Allergen Reactions  . Penicillins Anaphylaxis  . Erythromycin Other (See Comments)    SEVERE STOMACH CRAMPS  . Morphine And Related Nausea And Vomiting  . Nsaids Other (See Comments)    SEVERE STOMACH CRAMPS  . Nickel Rash    Current  Outpatient Prescriptions on File Prior to Visit  Medication Sig Dispense Refill  . Calcium Carb-Cholecalciferol (CALCIUM 600 + D PO) Take 2 tablets by mouth daily.    . folic acid (FOLVITE) 1 MG tablet Take 1 mg by mouth daily.  3  . HYDROcodone-acetaminophen (NORCO) 7.5-325 MG per tablet Take 1 tablet by mouth 2 (two) times daily. 60 tablet 0  . lisinopril (PRINIVIL,ZESTRIL) 10 MG tablet Take 1 tablet (10 mg total) by mouth every morning. 90 tablet 3  . methotrexate (RHEUMATREX) 2.5 MG tablet   3  . methylphenidate (RITALIN) 20 MG tablet Take 1 tablet (20 mg total) by mouth 2 (two) times daily. 60 tablet 0  . methylphenidate (RITALIN) 20 MG tablet Take 1 tablet (20 mg total) by mouth 2 (two) times daily. 60  tablet 0  . methylphenidate (RITALIN) 20 MG tablet Take 1 tablet (20 mg total) by mouth 2 (two) times daily. 60 tablet 0  . Multiple Vitamins-Minerals (MULTIVITAMIN WITH MINERALS) tablet Take 1 tablet by mouth daily.    Marland Kitchen zoster vaccine live, PF, (ZOSTAVAX) 37482 UNT/0.65ML injection Inject 19,400 Units into the skin once. 1 each 0   No current facility-administered medications on file prior to visit.    BP 160/100 mmHg  Temp(Src) 98.8 F (37.1 C) (Oral)  Ht 5' 6.25" (1.683 m)  Wt 219 lb (99.338 kg)  BMI 35.07 kg/m2       Objective:   Physical Exam  Constitutional: She is oriented to person, place, and time. She appears well-developed and well-nourished. No distress.  Cardiovascular: Normal rate, regular rhythm, normal heart sounds and intact distal pulses.  Exam reveals no gallop and no friction rub.   No murmur heard. Pulmonary/Chest: Effort normal and breath sounds normal. No respiratory distress. She has no wheezes. She has no rales. She exhibits no tenderness.  Abdominal: Soft. Bowel sounds are normal. She exhibits mass. She exhibits no distension. There is no tenderness. There is no rebound and no guarding.  Neurological: She is alert and oriented to person, place, and time.  Skin: Skin is warm and dry. No rash noted. No erythema. No pallor.  Total body skin exam normal except for multiple seborrheic keratosis. Nothing appears abnormal.   Psychiatric: She has a normal mood and affect. Her behavior is normal. Judgment and thought content normal.  Nursing note and vitals reviewed.     Assessment & Plan:  1. Depression - citalopram (CELEXA) 40 MG tablet; Take 1 tablet (40 mg total) by mouth daily.  Dispense: 30 tablet; Refill: 3 - Follow up if no improvement in the next 4 weeks.  - Any thoughts of SI or self harm, please go to the ER.

## 2014-09-06 DIAGNOSIS — M25522 Pain in left elbow: Secondary | ICD-10-CM | POA: Diagnosis not present

## 2014-09-11 ENCOUNTER — Other Ambulatory Visit (HOSPITAL_COMMUNITY): Payer: Self-pay | Admitting: Orthopedic Surgery

## 2014-09-11 DIAGNOSIS — M25562 Pain in left knee: Secondary | ICD-10-CM | POA: Diagnosis not present

## 2014-09-11 DIAGNOSIS — Z4789 Encounter for other orthopedic aftercare: Secondary | ICD-10-CM | POA: Diagnosis not present

## 2014-09-11 DIAGNOSIS — G8929 Other chronic pain: Secondary | ICD-10-CM

## 2014-09-13 DIAGNOSIS — M25522 Pain in left elbow: Secondary | ICD-10-CM | POA: Diagnosis not present

## 2014-09-24 ENCOUNTER — Encounter (HOSPITAL_COMMUNITY)
Admission: RE | Admit: 2014-09-24 | Discharge: 2014-09-24 | Disposition: A | Discharge status: Home / Self Care | Payer: Medicare Other | Source: Ambulatory Visit | Attending: Orthopedic Surgery | Admitting: Orthopedic Surgery

## 2014-09-24 DIAGNOSIS — G8929 Other chronic pain: Secondary | ICD-10-CM | POA: Insufficient documentation

## 2014-09-24 DIAGNOSIS — Z96652 Presence of left artificial knee joint: Secondary | ICD-10-CM | POA: Diagnosis not present

## 2014-09-24 DIAGNOSIS — M25562 Pain in left knee: Secondary | ICD-10-CM | POA: Insufficient documentation

## 2014-09-24 MED ORDER — TECHNETIUM TC 99M MEDRONATE IV KIT
27.5000 | PACK | Freq: Once | INTRAVENOUS | Status: AC | PRN
  Administered 2014-09-24: 27.5 via INTRAVENOUS

## 2014-09-27 DIAGNOSIS — M25522 Pain in left elbow: Secondary | ICD-10-CM | POA: Diagnosis not present

## 2014-09-28 DIAGNOSIS — M5030 Other cervical disc degeneration, unspecified cervical region: Secondary | ICD-10-CM | POA: Diagnosis not present

## 2014-09-28 DIAGNOSIS — M25511 Pain in right shoulder: Secondary | ICD-10-CM | POA: Diagnosis not present

## 2014-09-28 DIAGNOSIS — M150 Primary generalized (osteo)arthritis: Secondary | ICD-10-CM | POA: Diagnosis not present

## 2014-09-28 DIAGNOSIS — L4050 Arthropathic psoriasis, unspecified: Secondary | ICD-10-CM | POA: Diagnosis not present

## 2014-09-28 DIAGNOSIS — L401 Generalized pustular psoriasis: Secondary | ICD-10-CM | POA: Diagnosis not present

## 2014-09-28 DIAGNOSIS — M5136 Other intervertebral disc degeneration, lumbar region: Secondary | ICD-10-CM | POA: Diagnosis not present

## 2014-09-28 DIAGNOSIS — G5622 Lesion of ulnar nerve, left upper limb: Secondary | ICD-10-CM | POA: Diagnosis not present

## 2014-10-01 ENCOUNTER — Ambulatory Visit (INDEPENDENT_AMBULATORY_CARE_PROVIDER_SITE_OTHER): Payer: Medicare Other | Admitting: Adult Health

## 2014-10-01 ENCOUNTER — Encounter: Payer: Self-pay | Admitting: Adult Health

## 2014-10-01 VITALS — BP 160/86 | Temp 98.5°F | Ht 66.25 in | Wt 215.0 lb

## 2014-10-01 DIAGNOSIS — F329 Major depressive disorder, single episode, unspecified: Secondary | ICD-10-CM

## 2014-10-01 DIAGNOSIS — J029 Acute pharyngitis, unspecified: Secondary | ICD-10-CM | POA: Diagnosis not present

## 2014-10-01 DIAGNOSIS — F32A Depression, unspecified: Secondary | ICD-10-CM

## 2014-10-01 MED ORDER — MAGIC MOUTHWASH W/LIDOCAINE: 5.0000 mL | Freq: Three times a day (TID) | ORAL | Status: DC | PRN

## 2014-10-01 MED ORDER — CITALOPRAM HYDROBROMIDE 40 MG PO TABS: 40.0000 mg | ORAL_TABLET | Freq: Every day | ORAL | Status: DC

## 2014-10-01 NOTE — Patient Instructions (Signed)
It was great seeing you again.   Stay on the current dose of Celexa.   Try calling the numbers below to follow up with psychiatry   Sheralyn Boatman, MD [phone], on [address]) and Norma Fredrickson, MD [phone] on [address]).   Use the magic mouth wash as directed. If you need anything in the meantime, please let me know.

## 2014-10-01 NOTE — Progress Notes (Signed)
Pre visit review using our clinic review tool, if applicable. No additional management support is needed unless otherwise documented below in the visit note.  Subjective:    Patient ID: Tonya Bartlett, female    DOB: Jun 27, 1945, 69 y.o.   MRN: [id number]  HPI 69 year old female who presents to the office for follow up regarding her depression. During the last visit we had increased her Celexa to 40mg  due to a gradual decline in her mood over the previous two months. In the office today she reports that she is not crying anymore but that she "does not feel any different." She has not followed up with psychiatry. She endorses that she is bringing back her son from [place] due to a drug and alcohol problem.   Her other complaint is that of a sore throat x 5 days. Denies trouble swallowing, fevers, chills, or change in voice or appetite.   Review of Systems  Constitutional: Negative.   HENT: Positive for sore throat. Negative for facial swelling, trouble swallowing and voice change.   Respiratory: Negative.   Cardiovascular: Negative.   Gastrointestinal: Negative.   Musculoskeletal: Positive for joint swelling (bilateral knee) and arthralgias (bilateral knee).  Neurological: Negative.   Psychiatric/Behavioral: Negative.   All other systems reviewed and are negative.  Past Medical History  Diagnosis Date  . OA (osteoarthritis)     RIGHT SHOULDER  . Right rotator cuff tear   . Hyperlipidemia   . Allergic rhinitis   . Unspecified essential hypertension   . Migraine headache   . 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 History of iron deficiency anemia   . History of migraine headaches   . Frequency of urination   . Recovering alcoholic     SINCE [id number]    Social History   Social History  . Marital Status: Married    Spouse Name: N/A  . Number of Children: N/A  . Years of Education: N/A   Occupational History  . Not on file.   Social History  Main Topics  . Smoking status: Former Smoker -- 0.50 packs/day for 15 years    Types: Cigarettes    Quit date: 05/15/1985  . Smokeless tobacco: Never Used  . Alcohol Use: No     Comment: RECOVERING ALCOHOLIC--  QUIT [id number]  . Drug Use: No  . Sexual Activity: Not on file   Other Topics Concern  . Not on file   Social History Narrative    Past Surgical History  Procedure Laterality Date  . Tonsillectomy and adenoidectomy  AGE 39  . Vaginal hysterectomy  1976  . Total knee arthroplasty Bilateral LEFT  1996/   RIGHT 2004  . Replacement total knee Left 2006  . Knee arthroscopy w/ meniscectomy Bilateral X2  LEFT /    X1  RIGHT  . Cataract extraction w/ intraocular lens  implant, bilateral    . Bunionectomy/  hammertoe correction  right foot  2011  . Shoulder arthroscopy with subacromial decompression, rotator cuff repair and bicep tendon repair Right 05/23/2013    Procedure: RIGHT SHOULDER ARTHROSCOPY EXAM UNDER ANESTHESIA  WITH SUBACROMIAL DECOMPRESSION,DISTAL CLAVICLE RESECTION, SADLABRAL DEBRIDEMENT CHONDROPLASTY, BICEP TENOTOMY ;  Surgeon: Sydnee Cabal, MD;  Location: [facility];  Service: Orthopedics;  Laterality: Right;    Family History  Problem Relation Age of Onset  . Heart disease Father 46  . Hypertension Father   . Cancer Sister  skin CA, 2 sisters    Allergies  Allergen Reactions  . Penicillins Anaphylaxis  . Erythromycin Other (See Comments)    SEVERE STOMACH CRAMPS  . Morphine And Related Nausea And Vomiting  . Nsaids Other (See Comments)    SEVERE STOMACH CRAMPS  . Nickel Rash    Current Outpatient Prescriptions on File Prior to Visit  Medication Sig Dispense Refill  . Calcium Carb-Cholecalciferol (CALCIUM 600 + D PO) Take 2 tablets by mouth daily.    . folic acid (FOLVITE) 1 MG tablet Take 1 mg by mouth daily.  3  . HYDROcodone-acetaminophen (NORCO) 7.5-325 MG per tablet Take 1 tablet by mouth 2 (two) times daily. 60 tablet 0  .  lisinopril (PRINIVIL,ZESTRIL) 10 MG tablet Take 1 tablet (10 mg total) by mouth every morning. 90 tablet 3  . methocarbamol (ROBAXIN) 500 MG tablet TAKE 1 TABLET BY MOUTH EVERY 6 TO 8 HOURS AS NEEDED FOR SPASMS  0  . methotrexate (RHEUMATREX) 2.5 MG tablet   3  . methylphenidate (RITALIN) 20 MG tablet Take 1 tablet (20 mg total) by mouth 2 (two) times daily. 60 tablet 0  . methylphenidate (RITALIN) 20 MG tablet Take 1 tablet (20 mg total) by mouth 2 (two) times daily. 60 tablet 0  . methylphenidate (RITALIN) 20 MG tablet Take 1 tablet (20 mg total) by mouth 2 (two) times daily. 60 tablet 0  . Multiple Vitamins-Minerals (MULTIVITAMIN WITH MINERALS) tablet Take 1 tablet by mouth daily.    Marland Kitchen zoster vaccine live, PF, (ZOSTAVAX) 92426 UNT/0.65ML injection Inject 19,400 Units into the skin once. 1 each 0   No current facility-administered medications on file prior to visit.    BP 160/86 mmHg  Temp(Src) 98.5 F (36.9 C) (Oral)  Ht 5' 6.25" (1.683 m)  Wt 215 lb (97.523 kg)  BMI 34.43 kg/m2       Objective:   Physical Exam  Constitutional: She is oriented to person, place, and time. She appears well-developed and well-nourished. No distress.  HENT:  Head: Normocephalic and atraumatic.  Right Ear: External ear normal.  Left Ear: External ear normal.  Nose: Nose normal.  Mouth/Throat: Oropharynx is clear and moist. No oropharyngeal exudate.  Cardiovascular: Normal rate, regular rhythm and normal heart sounds.  Exam reveals no gallop and no friction rub.   No murmur heard. Pulmonary/Chest: Effort normal and breath sounds normal. No respiratory distress. She has no wheezes. She has no rales. She exhibits no tenderness.  Neurological: She is alert and oriented to person, place, and time.  Skin: Skin is warm and dry. No rash noted. She is not diaphoretic. No erythema. No pallor.  Psychiatric: She has a normal mood and affect. Her behavior is normal. Judgment and thought content normal.  Nursing  note and vitals reviewed.      Assessment & Plan:  1. Depression - Names and numbers of psyciatry given to patient.  - Will not change her medications at this time due to life stressors coming up in the near future.  - citalopram (CELEXA) 40 MG tablet; Take 1 tablet (40 mg total) by mouth daily.  Dispense: 30 tablet; Refill: 3 - Go to the ER with any thoughts of SI or self harm.   2. Sore throat - Likely Viral in nature.  - Alum & Mag Hydroxide-Simeth (MAGIC MOUTHWASH W/LIDOCAINE) SOLN; Take 5 mLs by mouth 3 (three) times daily as needed for mouth pain.  Dispense: 50 mL; Refill: 0 - Follow up in 2-3 days if  no improvement in symptoms or sooner if symptoms worsen or fever greater than 101F

## 2014-10-03 DIAGNOSIS — M79642 Pain in left hand: Secondary | ICD-10-CM | POA: Diagnosis not present

## 2014-10-03 DIAGNOSIS — Z4789 Encounter for other orthopedic aftercare: Secondary | ICD-10-CM | POA: Diagnosis not present

## 2014-10-04 ENCOUNTER — Other Ambulatory Visit: Payer: Self-pay | Admitting: *Deleted

## 2014-10-04 MED ORDER — METHYLPHENIDATE HCL 20 MG PO TABS: 20.0000 mg | ORAL_TABLET | Freq: Two times a day (BID) | ORAL | Status: DC

## 2014-10-05 NOTE — Telephone Encounter (Signed)
rx ready for pick up and patient is aware  

## 2014-10-09 DIAGNOSIS — L4050 Arthropathic psoriasis, unspecified: Secondary | ICD-10-CM | POA: Diagnosis not present

## 2014-10-23 DIAGNOSIS — L4050 Arthropathic psoriasis, unspecified: Secondary | ICD-10-CM | POA: Diagnosis not present

## 2014-10-27 DIAGNOSIS — M25519 Pain in unspecified shoulder: Secondary | ICD-10-CM | POA: Diagnosis not present

## 2014-10-27 DIAGNOSIS — M25511 Pain in right shoulder: Secondary | ICD-10-CM | POA: Diagnosis not present

## 2014-10-27 DIAGNOSIS — M25512 Pain in left shoulder: Secondary | ICD-10-CM | POA: Diagnosis not present

## 2014-10-28 ENCOUNTER — Encounter (HOSPITAL_COMMUNITY): Payer: Self-pay | Admitting: Emergency Medicine

## 2014-10-28 ENCOUNTER — Inpatient Hospital Stay (HOSPITAL_COMMUNITY)
Admission: EM | Admit: 2014-10-28 | Discharge: 2014-10-30 | DRG: 872 | Disposition: A | Discharge status: Home / Self Care | Payer: Medicare Other | Attending: Internal Medicine | Admitting: Internal Medicine

## 2014-10-28 ENCOUNTER — Emergency Department (HOSPITAL_COMMUNITY): Payer: Medicare Other

## 2014-10-28 DIAGNOSIS — M2550 Pain in unspecified joint: Secondary | ICD-10-CM | POA: Diagnosis not present

## 2014-10-28 DIAGNOSIS — R509 Fever, unspecified: Secondary | ICD-10-CM | POA: Diagnosis present

## 2014-10-28 DIAGNOSIS — R6510 Systemic inflammatory response syndrome (SIRS) of non-infectious origin without acute organ dysfunction: Secondary | ICD-10-CM

## 2014-10-28 DIAGNOSIS — F32A Depression, unspecified: Secondary | ICD-10-CM | POA: Diagnosis present

## 2014-10-28 DIAGNOSIS — Z86718 Personal history of other venous thrombosis and embolism: Secondary | ICD-10-CM

## 2014-10-28 DIAGNOSIS — Z87891 Personal history of nicotine dependence: Secondary | ICD-10-CM

## 2014-10-28 DIAGNOSIS — T8069XA Other serum reaction due to other serum, initial encounter: Secondary | ICD-10-CM | POA: Diagnosis not present

## 2014-10-28 DIAGNOSIS — I10 Essential (primary) hypertension: Secondary | ICD-10-CM | POA: Diagnosis not present

## 2014-10-28 DIAGNOSIS — X58XXXA Exposure to other specified factors, initial encounter: Secondary | ICD-10-CM | POA: Diagnosis present

## 2014-10-28 DIAGNOSIS — G43909 Migraine, unspecified, not intractable, without status migrainosus: Secondary | ICD-10-CM | POA: Diagnosis present

## 2014-10-28 DIAGNOSIS — Z96653 Presence of artificial knee joint, bilateral: Secondary | ICD-10-CM | POA: Diagnosis present

## 2014-10-28 DIAGNOSIS — A419 Sepsis, unspecified organism: Principal | ICD-10-CM | POA: Diagnosis present

## 2014-10-28 DIAGNOSIS — Z9842 Cataract extraction status, left eye: Secondary | ICD-10-CM

## 2014-10-28 DIAGNOSIS — Z8249 Family history of ischemic heart disease and other diseases of the circulatory system: Secondary | ICD-10-CM

## 2014-10-28 DIAGNOSIS — Z9841 Cataract extraction status, right eye: Secondary | ICD-10-CM

## 2014-10-28 DIAGNOSIS — M1990 Unspecified osteoarthritis, unspecified site: Secondary | ICD-10-CM | POA: Diagnosis present

## 2014-10-28 DIAGNOSIS — E785 Hyperlipidemia, unspecified: Secondary | ICD-10-CM | POA: Diagnosis present

## 2014-10-28 DIAGNOSIS — J309 Allergic rhinitis, unspecified: Secondary | ICD-10-CM | POA: Diagnosis present

## 2014-10-28 DIAGNOSIS — F329 Major depressive disorder, single episode, unspecified: Secondary | ICD-10-CM | POA: Diagnosis present

## 2014-10-28 DIAGNOSIS — L4050 Arthropathic psoriasis, unspecified: Secondary | ICD-10-CM | POA: Diagnosis present

## 2014-10-28 DIAGNOSIS — Z881 Allergy status to other antibiotic agents status: Secondary | ICD-10-CM

## 2014-10-28 DIAGNOSIS — Z808 Family history of malignant neoplasm of other organs or systems: Secondary | ICD-10-CM

## 2014-10-28 DIAGNOSIS — Z886 Allergy status to analgesic agent status: Secondary | ICD-10-CM

## 2014-10-28 DIAGNOSIS — Z79891 Long term (current) use of opiate analgesic: Secondary | ICD-10-CM

## 2014-10-28 DIAGNOSIS — Z79899 Other long term (current) drug therapy: Secondary | ICD-10-CM

## 2014-10-28 DIAGNOSIS — Z90710 Acquired absence of both cervix and uterus: Secondary | ICD-10-CM

## 2014-10-28 DIAGNOSIS — F988 Other specified behavioral and emotional disorders with onset usually occurring in childhood and adolescence: Secondary | ICD-10-CM | POA: Diagnosis present

## 2014-10-28 DIAGNOSIS — Z961 Presence of intraocular lens: Secondary | ICD-10-CM | POA: Diagnosis present

## 2014-10-28 DIAGNOSIS — Z885 Allergy status to narcotic agent status: Secondary | ICD-10-CM

## 2014-10-28 DIAGNOSIS — T50Z95A Adverse effect of other vaccines and biological substances, initial encounter: Secondary | ICD-10-CM | POA: Diagnosis present

## 2014-10-28 DIAGNOSIS — Z880 Allergy status to penicillin: Secondary | ICD-10-CM

## 2014-10-28 LAB — I-STAT CG4 LACTIC ACID, ED: LACTIC ACID, VENOUS: 0.98 mmol/L (ref 0.5–2.0)

## 2014-10-28 NOTE — ED Notes (Signed)
Hx of arthritis pain.  Was seen in [facility] 10/26/14 and was kept overnight for obs.  Came home with pain meds, states that pain was much worse than it was and she had begun a fever.  Generalized pain through joints.  103.1 oral  BP165/73 P110  .

## 2014-10-28 NOTE — ED Notes (Signed)
Bed: NP00 Expected date:  Expected time:  Means of arrival:  Comments: Arthritis pain/fever

## 2014-10-29 ENCOUNTER — Encounter (HOSPITAL_COMMUNITY): Payer: Self-pay

## 2014-10-29 DIAGNOSIS — Z9841 Cataract extraction status, right eye: Secondary | ICD-10-CM | POA: Diagnosis not present

## 2014-10-29 DIAGNOSIS — I10 Essential (primary) hypertension: Secondary | ICD-10-CM | POA: Diagnosis not present

## 2014-10-29 DIAGNOSIS — Z808 Family history of malignant neoplasm of other organs or systems: Secondary | ICD-10-CM | POA: Diagnosis not present

## 2014-10-29 DIAGNOSIS — L4050 Arthropathic psoriasis, unspecified: Secondary | ICD-10-CM | POA: Diagnosis present

## 2014-10-29 DIAGNOSIS — Z87891 Personal history of nicotine dependence: Secondary | ICD-10-CM | POA: Diagnosis not present

## 2014-10-29 DIAGNOSIS — Z8249 Family history of ischemic heart disease and other diseases of the circulatory system: Secondary | ICD-10-CM | POA: Diagnosis not present

## 2014-10-29 DIAGNOSIS — Z79891 Long term (current) use of opiate analgesic: Secondary | ICD-10-CM | POA: Diagnosis not present

## 2014-10-29 DIAGNOSIS — Z880 Allergy status to penicillin: Secondary | ICD-10-CM | POA: Diagnosis not present

## 2014-10-29 DIAGNOSIS — T8069XA Other serum reaction due to other serum, initial encounter: Secondary | ICD-10-CM | POA: Diagnosis present

## 2014-10-29 DIAGNOSIS — Z961 Presence of intraocular lens: Secondary | ICD-10-CM | POA: Diagnosis present

## 2014-10-29 DIAGNOSIS — Z9842 Cataract extraction status, left eye: Secondary | ICD-10-CM | POA: Diagnosis not present

## 2014-10-29 DIAGNOSIS — M2550 Pain in unspecified joint: Secondary | ICD-10-CM | POA: Diagnosis present

## 2014-10-29 DIAGNOSIS — Z885 Allergy status to narcotic agent status: Secondary | ICD-10-CM | POA: Diagnosis not present

## 2014-10-29 DIAGNOSIS — F329 Major depressive disorder, single episode, unspecified: Secondary | ICD-10-CM | POA: Diagnosis present

## 2014-10-29 DIAGNOSIS — J309 Allergic rhinitis, unspecified: Secondary | ICD-10-CM | POA: Diagnosis present

## 2014-10-29 DIAGNOSIS — Z886 Allergy status to analgesic agent status: Secondary | ICD-10-CM | POA: Diagnosis not present

## 2014-10-29 DIAGNOSIS — F988 Other specified behavioral and emotional disorders with onset usually occurring in childhood and adolescence: Secondary | ICD-10-CM | POA: Diagnosis present

## 2014-10-29 DIAGNOSIS — Z86718 Personal history of other venous thrombosis and embolism: Secondary | ICD-10-CM | POA: Diagnosis not present

## 2014-10-29 DIAGNOSIS — G43909 Migraine, unspecified, not intractable, without status migrainosus: Secondary | ICD-10-CM | POA: Diagnosis present

## 2014-10-29 DIAGNOSIS — Z90710 Acquired absence of both cervix and uterus: Secondary | ICD-10-CM | POA: Diagnosis not present

## 2014-10-29 DIAGNOSIS — E785 Hyperlipidemia, unspecified: Secondary | ICD-10-CM | POA: Diagnosis present

## 2014-10-29 DIAGNOSIS — T50Z95A Adverse effect of other vaccines and biological substances, initial encounter: Secondary | ICD-10-CM | POA: Diagnosis present

## 2014-10-29 DIAGNOSIS — A419 Sepsis, unspecified organism: Secondary | ICD-10-CM | POA: Diagnosis present

## 2014-10-29 DIAGNOSIS — Z79899 Other long term (current) drug therapy: Secondary | ICD-10-CM | POA: Diagnosis not present

## 2014-10-29 DIAGNOSIS — R509 Fever, unspecified: Secondary | ICD-10-CM | POA: Diagnosis not present

## 2014-10-29 DIAGNOSIS — R6510 Systemic inflammatory response syndrome (SIRS) of non-infectious origin without acute organ dysfunction: Secondary | ICD-10-CM | POA: Diagnosis present

## 2014-10-29 DIAGNOSIS — Z881 Allergy status to other antibiotic agents status: Secondary | ICD-10-CM | POA: Diagnosis not present

## 2014-10-29 DIAGNOSIS — Z96653 Presence of artificial knee joint, bilateral: Secondary | ICD-10-CM | POA: Diagnosis present

## 2014-10-29 DIAGNOSIS — M1990 Unspecified osteoarthritis, unspecified site: Secondary | ICD-10-CM | POA: Diagnosis present

## 2014-10-29 DIAGNOSIS — X58XXXA Exposure to other specified factors, initial encounter: Secondary | ICD-10-CM | POA: Diagnosis present

## 2014-10-29 LAB — CBC WITH DIFFERENTIAL/PLATELET
Basophils Absolute: 0 10*3/uL (ref 0.0–0.1)
Basophils Relative: 0 % (ref 0–1)
Eosinophils Absolute: 0 10*3/uL (ref 0.0–0.7)
Eosinophils Relative: 0 % (ref 0–5)
HCT: 33.4 % — ABNORMAL LOW (ref 36.0–46.0)
Hemoglobin: 11 g/dL — ABNORMAL LOW (ref 12.0–15.0)
Lymphocytes Relative: 6 % — ABNORMAL LOW (ref 12–46)
Lymphs Abs: 0.7 10*3/uL (ref 0.7–4.0)
MCH: 31.8 pg (ref 26.0–34.0)
MCHC: 32.9 g/dL (ref 30.0–36.0)
MCV: 96.5 fL (ref 78.0–100.0)
MONO ABS: 0.8 10*3/uL (ref 0.1–1.0)
Monocytes Relative: 7 % (ref 3–12)
Neutro Abs: 10.1 10*3/uL — ABNORMAL HIGH (ref 1.7–7.7)
Neutrophils Relative %: 87 % — ABNORMAL HIGH (ref 43–77)
Platelets: 244 10*3/uL (ref 150–400)
RBC: 3.46 MIL/uL — ABNORMAL LOW (ref 3.87–5.11)
RDW: 12.2 % (ref 11.5–15.5)
WBC: 11.6 10*3/uL — ABNORMAL HIGH (ref 4.0–10.5)

## 2014-10-29 LAB — URINALYSIS, ROUTINE W REFLEX MICROSCOPIC
Bilirubin Urine: NEGATIVE
GLUCOSE, UA: NEGATIVE mg/dL
Ketones, ur: 40 mg/dL — AB
LEUKOCYTES UA: NEGATIVE
Nitrite: NEGATIVE
PH: 6.5 (ref 5.0–8.0)
Protein, ur: NEGATIVE mg/dL
Specific Gravity, Urine: 1.012 (ref 1.005–1.030)
Urobilinogen, UA: 1 mg/dL (ref 0.0–1.0)

## 2014-10-29 LAB — COMPREHENSIVE METABOLIC PANEL
ALBUMIN: 3.9 g/dL (ref 3.5–5.0)
ALT: 39 U/L (ref 14–54)
AST: 33 U/L (ref 15–41)
Alkaline Phosphatase: 72 U/L (ref 38–126)
Anion gap: 10 (ref 5–15)
BUN: 10 mg/dL (ref 6–20)
CO2: 21 mmol/L — ABNORMAL LOW (ref 22–32)
Calcium: 8.6 mg/dL — ABNORMAL LOW (ref 8.9–10.3)
Chloride: 101 mmol/L (ref 101–111)
Creatinine, Ser: 0.54 mg/dL (ref 0.44–1.00)
GFR calc Af Amer: >60 mL/min (ref >60)
GFR calc non Af Amer: >60 mL/min (ref >60)
Glucose, Bld: 122 mg/dL — ABNORMAL HIGH (ref 65–99)
POTASSIUM: 3.6 mmol/L (ref 3.5–5.1)
Sodium: 132 mmol/L — ABNORMAL LOW (ref 135–145)
Total Bilirubin: 1.1 mg/dL (ref 0.3–1.2)
Total Protein: 7.2 g/dL (ref 6.5–8.1)

## 2014-10-29 LAB — URINE MICROSCOPIC-ADD ON

## 2014-10-29 LAB — BASIC METABOLIC PANEL
ANION GAP: 7 (ref 5–15)
BUN: 9 mg/dL (ref 6–20)
CALCIUM: 8.1 mg/dL — AB (ref 8.9–10.3)
CO2: 21 mmol/L — ABNORMAL LOW (ref 22–32)
CREATININE: 0.59 mg/dL (ref 0.44–1.00)
Chloride: 109 mmol/L (ref 101–111)
Glucose, Bld: 154 mg/dL — ABNORMAL HIGH (ref 65–99)
Potassium: 3.5 mmol/L (ref 3.5–5.1)
SODIUM: 137 mmol/L (ref 135–145)

## 2014-10-29 LAB — CBC
HCT: 30 % — ABNORMAL LOW (ref 36.0–46.0)
HEMOGLOBIN: 10.1 g/dL — AB (ref 12.0–15.0)
MCH: 32.7 pg (ref 26.0–34.0)
MCHC: 33.7 g/dL (ref 30.0–36.0)
MCV: 97.1 fL (ref 78.0–100.0)
PLATELETS: 241 10*3/uL (ref 150–400)
RBC: 3.09 MIL/uL — AB (ref 3.87–5.11)
RDW: 12.4 % (ref 11.5–15.5)
WBC: 10.7 10*3/uL — AB (ref 4.0–10.5)

## 2014-10-29 LAB — INFLUENZA PANEL BY PCR (TYPE A & B)
H1N1FLUPCR: NOT DETECTED
INFLAPCR: NEGATIVE
INFLBPCR: NEGATIVE

## 2014-10-29 LAB — C-REACTIVE PROTEIN: CRP: 11.8 mg/dL — AB (ref <1.0)

## 2014-10-29 LAB — SEDIMENTATION RATE: Sed Rate: 50 mm/hr — ABNORMAL HIGH (ref 0–22)

## 2014-10-29 LAB — RAPID STREP SCREEN (MED CTR MEBANE ONLY): STREPTOCOCCUS, GROUP A SCREEN (DIRECT): NEGATIVE

## 2014-10-29 LAB — I-STAT CG4 LACTIC ACID, ED: Lactic Acid, Venous: 0.35 mmol/L — ABNORMAL LOW (ref 0.5–2.0)

## 2014-10-29 LAB — CBG MONITORING, ED: GLUCOSE-CAPILLARY: 117 mg/dL — AB (ref 65–99)

## 2014-10-29 MED ORDER — SODIUM CHLORIDE 0.9 % IV BOLUS (SEPSIS)
2000.0000 mL | Freq: Once | INTRAVENOUS | Status: AC
  Administered 2014-10-29: 2000 mL via INTRAVENOUS

## 2014-10-29 MED ORDER — SODIUM CHLORIDE 0.9 % IJ SOLN: 3.0000 mL | Freq: Two times a day (BID) | INTRAMUSCULAR | Status: DC

## 2014-10-29 MED ORDER — VANCOMYCIN HCL 10 G IV SOLR
1250.0000 mg | Freq: Two times a day (BID) | INTRAVENOUS | Status: DC
  Administered 2014-10-29 – 2014-10-30 (×2): 1250 mg via INTRAVENOUS
  Filled 2014-10-29 (×3): qty 1250

## 2014-10-29 MED ORDER — VANCOMYCIN HCL 10 G IV SOLR
1250.0000 mg | INTRAVENOUS | Status: AC
  Administered 2014-10-29: 1250 mg via INTRAVENOUS
  Filled 2014-10-29: qty 1250

## 2014-10-29 MED ORDER — ACETAMINOPHEN 325 MG PO TABS: 650.0000 mg | ORAL_TABLET | Freq: Four times a day (QID) | ORAL | Status: DC | PRN

## 2014-10-29 MED ORDER — HEPARIN SODIUM (PORCINE) 5000 UNIT/ML IJ SOLN
5000.0000 [IU] | Freq: Three times a day (TID) | INTRAMUSCULAR | Status: DC
  Administered 2014-10-29 – 2014-10-30 (×4): 5000 [IU] via SUBCUTANEOUS
  Filled 2014-10-29 (×7): qty 1

## 2014-10-29 MED ORDER — ONDANSETRON HCL 4 MG/2ML IJ SOLN: 4.0000 mg | Freq: Four times a day (QID) | INTRAMUSCULAR | Status: DC | PRN

## 2014-10-29 MED ORDER — HYDRALAZINE HCL 20 MG/ML IJ SOLN: 5.0000 mg | INTRAMUSCULAR | Status: DC | PRN

## 2014-10-29 MED ORDER — METHYLPREDNISOLONE SODIUM SUCC 125 MG IJ SOLR
60.0000 mg | Freq: Two times a day (BID) | INTRAMUSCULAR | Status: DC
  Administered 2014-10-29 – 2014-10-30 (×3): 60 mg via INTRAVENOUS
  Filled 2014-10-29 (×3): qty 2

## 2014-10-29 MED ORDER — ACETAMINOPHEN 500 MG PO TABS: 1000.0000 mg | ORAL_TABLET | Freq: Once | ORAL | Status: DC

## 2014-10-29 MED ORDER — HYDROCODONE-ACETAMINOPHEN 7.5-325 MG PO TABS
2.0000 | ORAL_TABLET | Freq: Four times a day (QID) | ORAL | Status: DC | PRN
  Administered 2014-10-29 (×2): 2 via ORAL
  Filled 2014-10-29 (×2): qty 2

## 2014-10-29 MED ORDER — FOLIC ACID 1 MG PO TABS
1.0000 mg | ORAL_TABLET | Freq: Every day | ORAL | Status: DC
  Administered 2014-10-29 – 2014-10-30 (×2): 1 mg via ORAL
  Filled 2014-10-29 (×2): qty 1

## 2014-10-29 MED ORDER — DEXTROSE 5 % IV SOLN
2.0000 g | Freq: Three times a day (TID) | INTRAVENOUS | Status: DC
  Administered 2014-10-29 – 2014-10-30 (×3): 2 g via INTRAVENOUS
  Filled 2014-10-29 (×4): qty 2

## 2014-10-29 MED ORDER — HYDROCODONE-ACETAMINOPHEN 7.5-325 MG PO TABS: 1.0000 | ORAL_TABLET | Freq: Two times a day (BID) | ORAL | Status: DC

## 2014-10-29 MED ORDER — ACETAMINOPHEN 650 MG RE SUPP: 650.0000 mg | Freq: Four times a day (QID) | RECTAL | Status: DC | PRN

## 2014-10-29 MED ORDER — METHYLPHENIDATE HCL 5 MG PO TABS: 20.0000 mg | ORAL_TABLET | Freq: Two times a day (BID) | ORAL | Status: DC | PRN

## 2014-10-29 MED ORDER — ADULT MULTIVITAMIN W/MINERALS CH
1.0000 | ORAL_TABLET | Freq: Every day | ORAL | Status: DC
  Administered 2014-10-29 – 2014-10-30 (×2): 1 via ORAL
  Filled 2014-10-29 (×2): qty 1

## 2014-10-29 MED ORDER — SODIUM CHLORIDE 0.9 % IV SOLN
INTRAVENOUS | Status: DC
  Administered 2014-10-29 – 2014-10-30 (×3): via INTRAVENOUS

## 2014-10-29 MED ORDER — CITALOPRAM HYDROBROMIDE 20 MG PO TABS
40.0000 mg | ORAL_TABLET | Freq: Every day | ORAL | Status: DC
  Administered 2014-10-29 – 2014-10-30 (×2): 40 mg via ORAL
  Filled 2014-10-29: qty 2
  Filled 2014-10-29: qty 1
  Filled 2014-10-29: qty 2

## 2014-10-29 MED ORDER — MAGIC MOUTHWASH W/LIDOCAINE
5.0000 mL | Freq: Three times a day (TID) | ORAL | Status: DC | PRN
  Filled 2014-10-29: qty 5

## 2014-10-29 MED ORDER — DEXTROSE 5 % IV SOLN
2.0000 g | INTRAVENOUS | Status: AC
  Administered 2014-10-29: 2 g via INTRAVENOUS
  Filled 2014-10-29: qty 2

## 2014-10-29 MED ORDER — METHOCARBAMOL 500 MG PO TABS: 500.0000 mg | ORAL_TABLET | Freq: Four times a day (QID) | ORAL | Status: DC | PRN

## 2014-10-29 MED ORDER — CALCIUM CARBONATE-VITAMIN D 500-200 MG-UNIT PO TABS
1.0000 | ORAL_TABLET | Freq: Every day | ORAL | Status: DC
  Administered 2014-10-29 – 2014-10-30 (×2): 1 via ORAL
  Filled 2014-10-29 (×3): qty 1

## 2014-10-29 MED ORDER — ONDANSETRON HCL 4 MG PO TABS: 4.0000 mg | ORAL_TABLET | Freq: Four times a day (QID) | ORAL | Status: DC | PRN

## 2014-10-29 MED ORDER — ACETAMINOPHEN 325 MG PO TABS
650.0000 mg | ORAL_TABLET | Freq: Once | ORAL | Status: AC
  Administered 2014-10-29: 650 mg via ORAL
  Filled 2014-10-29: qty 2

## 2014-10-29 MED ORDER — KETOROLAC TROMETHAMINE 30 MG/ML IJ SOLN
30.0000 mg | Freq: Once | INTRAMUSCULAR | Status: AC
  Administered 2014-10-29: 30 mg via INTRAVENOUS
  Filled 2014-10-29: qty 1

## 2014-10-29 NOTE — ED Notes (Signed)
Went to prepare pt to go upstairs to room, pt requesting to use restroom first. Pt ambulatory to bathroom.

## 2014-10-29 NOTE — ED Provider Notes (Signed)
CSN: [id number]     Arrival date & time 10/28/14  2311 History   First MD Initiated Contact with Patient 10/29/14 0007     Chief Complaint  Patient presents with  . Fever  . Joint Pain  . Arthritis    (Consider location/radiation/quality/duration/timing/severity/associated sxs/prior Treatment) HPI Comments: Patient is a 69 year old female with a history of psoriatic arthritis who began Remicade infusions last week who presents to the emergency department for further evaluation of body pain. Patient states that she began to experience body aches 4 days ago. She reports hurting in "all of my joints". Onset of symptoms came 2 days after a Remicade infusion. She has had a total of 2 infusions thus far. Patient was on vacation at the time of symptom onset and presented to the local emergency department. She reports that she had a fever at this time and was admitted for monitoring; she was discharged the following day. Patient had Toradol during this admission which improved her pain. She states that pain has acutely worsened, again, in the past 48 hours. Pain is worse in her b/l hips and aching in nature. Pain is worse with movement. Patient with temperature of 103.85F with EMS prior to arrival; rectal temperature 102.23F in ED. Patient denies chest pain, shortness of breath, abdominal pain, nausea, vomiting, dysuria or hematuria, diarrhea, melanotic or hematochezia.  PCP - Dr. Sherren Mocha Rheumatologist - Dr. Marijean Bravo  Patient is a 69 y.o. female presenting with fever and arthritis. The history is provided by the patient. No language interpreter was used.  Fever Associated symptoms: myalgias   Associated symptoms: no chest pain, no dysuria, no nausea and no vomiting   Arthritis Associated symptoms include arthralgias, a fever and myalgias. Pertinent negatives include no chest pain, nausea or vomiting.    Past Medical History  Diagnosis Date  . OA (osteoarthritis)     RIGHT SHOULDER  . Right rotator  cuff tear   . Hyperlipidemia   . Allergic rhinitis   . Unspecified essential hypertension   . Migraine headache   . 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 History of iron deficiency anemia   . History of migraine headaches   . Frequency of urination   . Recovering alcoholic     SINCE [id number]   Past Surgical History  Procedure Laterality Date  . Tonsillectomy and adenoidectomy  AGE 70  . Vaginal hysterectomy  1976  . Total knee arthroplasty Bilateral LEFT  1996/   RIGHT 2004  . Replacement total knee Left 2006  . Knee arthroscopy w/ meniscectomy Bilateral X2  LEFT /    X1  RIGHT  . Cataract extraction w/ intraocular lens  implant, bilateral    . Bunionectomy/  hammertoe correction  right foot  2011  . Shoulder arthroscopy with subacromial decompression, rotator cuff repair and bicep tendon repair Right 05/23/2013    Procedure: RIGHT SHOULDER ARTHROSCOPY EXAM UNDER ANESTHESIA  WITH SUBACROMIAL DECOMPRESSION,DISTAL CLAVICLE RESECTION, SADLABRAL DEBRIDEMENT CHONDROPLASTY, BICEP TENOTOMY ;  Surgeon: Sydnee Cabal, MD;  Location: [facility];  Service: Orthopedics;  Laterality: Right;   Family History  Problem Relation Age of Onset  . Heart disease Father 42  . Hypertension Father   . Cancer Sister     skin CA, 2 sisters   Social History  Substance Use Topics  . Smoking status: Former Smoker -- 0.50 packs/day for 15 years    Types: Cigarettes    Quit  date: 05/15/1985  . Smokeless tobacco: Never Used  . Alcohol Use: No     Comment: RECOVERING ALCOHOLIC--  QUIT [id number]   OB History    No data available      Review of Systems  Constitutional: Positive for fever.  Respiratory: Negative for shortness of breath.   Cardiovascular: Negative for chest pain.  Gastrointestinal: Negative for nausea and vomiting.  Genitourinary: Negative for dysuria and hematuria.  Musculoskeletal: Positive for myalgias, arthralgias and arthritis.   Neurological: Negative for syncope.  All other systems reviewed and are negative.   Allergies  Penicillins; Erythromycin; Morphine and related; Nsaids; and Nickel  Home Medications   Prior to Admission medications   Medication Sig Start Date End Date Taking? Authorizing Provider  Calcium Carb-Cholecalciferol (CALCIUM 600 + D PO) Take 2 tablets by mouth daily.   Yes Historical Provider, MD  citalopram (CELEXA) 40 MG tablet Take 1 tablet (40 mg total) by mouth daily. 10/01/14  Yes [name], NP  inFLIXimab (REMICADE) 100 MG injection Inject 100 mg into the vein every 3 (three) months.   Yes Historical Provider, MD  lisinopril (PRINIVIL,ZESTRIL) 10 MG tablet Take 1 tablet (10 mg total) by mouth every morning. 11/16/13  Yes Dorena Cookey, MD  methylphenidate (RITALIN) 20 MG tablet Take 1 tablet (20 mg total) by mouth 2 (two) times daily. Patient taking differently: Take 20 mg by mouth 2 (two) times daily as needed. For attention 10/04/14  Yes Marletta Lor, MD  Multiple Vitamins-Minerals (MULTIVITAMIN WITH MINERALS) tablet Take 1 tablet by mouth daily.   Yes Historical Provider, MD  Alum & Mag Hydroxide-Simeth (MAGIC MOUTHWASH W/LIDOCAINE) SOLN Take 5 mLs by mouth 3 (three) times daily as needed for mouth pain. 10/01/14   [name], NP  folic acid (FOLVITE) 1 MG tablet Take 1 mg by mouth daily. 11/16/13   Historical Provider, MD  HYDROcodone-acetaminophen (NORCO) 7.5-325 MG per tablet Take 1 tablet by mouth 2 (two) times daily. 01/02/14   Dorena Cookey, MD  methocarbamol (ROBAXIN) 500 MG tablet TAKE 1 TABLET BY MOUTH EVERY 6 TO 8 HOURS AS NEEDED FOR SPASMS 06/20/14   Historical Provider, MD  methotrexate (RHEUMATREX) 2.5 MG tablet  11/16/13   Historical Provider, MD  methylphenidate (RITALIN) 20 MG tablet Take 1 tablet (20 mg total) by mouth 2 (two) times daily. Patient not taking: Reported on 10/29/2014 10/04/14   Marletta Lor, MD  methylphenidate (RITALIN) 20 MG tablet Take 1  tablet (20 mg total) by mouth 2 (two) times daily. Patient not taking: Reported on 10/29/2014 10/04/14   Marletta Lor, MD  zoster vaccine live, PF, (ZOSTAVAX) 68127 UNT/0.65ML injection Inject 19,400 Units into the skin once. 09/25/13   Dorena Cookey, MD   BP 139/56 mmHg  Pulse 95  Temp(Src) 102.4 F (39.1 C) (Rectal)  Resp 17  SpO2 94%   Physical Exam  Constitutional: She is oriented to person, place, and time. She appears well-developed and well-nourished. No distress.  Nontoxic/nonseptic appearing  HENT:  Head: Normocephalic and atraumatic.  Eyes: Conjunctivae and EOM are normal. No scleral icterus.  Neck: Normal range of motion.  Cardiovascular: Regular rhythm and intact distal pulses.  Tachycardia present.   Pulmonary/Chest: Effort normal. No respiratory distress. She has no wheezes. She has no rales.  Respirations even and unlabored. Lungs clear.  Abdominal: Soft. She exhibits no distension. There is no tenderness. There is no rebound and no guarding.  Soft, nontender abdomen  Musculoskeletal: Normal range of motion.  Neurological: She is alert and oriented to person, place, and time. She exhibits normal muscle tone. Coordination normal.  GCS 15. Patient moving all extremities  Skin: Skin is warm and dry. No rash noted. She is not diaphoretic. No erythema. No pallor.  Psychiatric: She has a normal mood and affect. Her behavior is normal.  Nursing note and vitals reviewed.   ED Course  Procedures (including critical care time) Labs Review Labs Reviewed  COMPREHENSIVE METABOLIC PANEL - Abnormal; Notable for the following:    Sodium 132 (*)    CO2 21 (*)    Glucose, Bld 122 (*)    Calcium 8.6 (*)    All other components within normal limits  URINALYSIS, ROUTINE W REFLEX MICROSCOPIC (NOT AT [facility]) - Abnormal; Notable for the following:    Hgb urine dipstick TRACE (*)    Ketones, ur 40 (*)    All other components within normal limits  CBC WITH DIFFERENTIAL/PLATELET -  Abnormal; Notable for the following:    WBC 11.6 (*)    RBC 3.46 (*)    Hemoglobin 11.0 (*)    HCT 33.4 (*)    Neutrophils Relative % 87 (*)    Neutro Abs 10.1 (*)    Lymphocytes Relative 6 (*)    All other components within normal limits  I-STAT CG4 LACTIC ACID, ED - Abnormal; Notable for the following:    Lactic Acid, Venous 0.35 (*)    All other components within normal limits  CULTURE, BLOOD (ROUTINE X 2)  CULTURE, BLOOD (ROUTINE X 2)  URINE CULTURE  URINE MICROSCOPIC-ADD ON  RPR  SEDIMENTATION RATE  C-REACTIVE PROTEIN  ANTINUCLEAR ANTIBODIES, IFA  I-STAT CG4 LACTIC ACID, ED    Imaging Review Dg Chest 2 View  10/29/2014   CLINICAL DATA:  Fever for 2 days.  EXAM: CHEST  2 VIEW  COMPARISON:  07/12/2007  FINDINGS: The cardiomediastinal contours are normal. Pulmonary vasculature is normal. No consolidation, pleural effusion, or pneumothorax. Small calcified granuloma in the right upper lung, partially obscured by overlying osseous structures. No acute osseous abnormalities are seen.  IMPRESSION: No acute pulmonary process.   Electronically Signed   By: Jeb Levering M.D.   On: 10/29/2014 01:01     I have personally reviewed and evaluated these images and lab results as part of my medical decision-making.   EKG Interpretation None      MDM   Final diagnoses:  SIRS (systemic inflammatory response syndrome)    69 y/o female with hx of psoriatic arthritis, on Remicade infusions x 1 week having a total of 2 infusions, presents to the ED for fever and arthralgias. No concern for septic joint at this time. Patient meets SIRS criteria. No source of fever identified; ? Viral etiology. Blood cultures pending. Patient to be admitted to [facility] for further monitoring of fever. IV antibiotics to be initiated.   Filed Vitals:   10/29/14 0100 10/29/14 0130 10/29/14 0200 10/29/14 0255  BP: 137/63 123/44 139/56   Pulse: 95 97 95   Temp:      TempSrc:      Resp: 12 20 17    Weight:     214 lb 15.2 oz (97.5 kg)  SpO2: 91% 93% 94%      Antonietta Breach, PA-C 10/29/14 0302  Varney Biles, MD 10/29/14 [phone]

## 2014-10-29 NOTE — Progress Notes (Signed)
Spoke with infection prevention, ok to D/C droplet precautions.

## 2014-10-29 NOTE — ED Notes (Signed)
Spoke with Janett Billow, Agricultural consultant. Patient can be transported at 0830.

## 2014-10-29 NOTE — H&P (Addendum)
[facility] History and Physical  Tonya Bartlett WFU:[id number] DOB: 10-05-1945 DOA: 10/28/2014  Referring physician: ED physician PCP: Joycelyn Man, MD  Specialists:   Chief Complaint: Fever, chills, diffused joint pain  HPI: Tonya Bartlett is a 69 y.o. female with PMH of some right cheek arthritis, hypertension, depression, osteoarthritis, DVT, migraine headache, attention deficit, who presents with the fever, chills, diffused joint pain.  Patient reports that because of psoriatic arthritis, she was started with Remicade infusions 2 weeks ago. She has had total of 2 infusions thus far. Patient states that she began to experience body aches 4 days ago and diffused joint pain, including pain over both hands, bilateral hips, bilateral joints and ankles. She also has fever with temperature 103.1 at home today and chills. No new rashes. He does not have cough, chest pain, shortness breath, abdominal pain, diarrhea, symptoms of UTI, unilateral weakness. Patient does not have obvious joint swelling.  In ED, patient was found to have WBC 11.6, temperature while 2.4, tachycardia, negative urinalysis, lactate is 0.98, electrolytes okay, negative chest x-ray. Patient is admitted to inpatient to further evaluate the treatment.  Where does patient live?   At home   Can patient participate in ADLs?  Yes    Review of Systems:   General: has fevers, chills, no changes in body weight, has fatigue HEENT: no blurry vision, hearing changes or sore throat Pulm: no dyspnea, coughing, wheezing CV: no chest pain, palpitations Abd: no nausea, vomiting, abdominal pain, diarrhea, constipation GU: no dysuria, burning on urination, increased urinary frequency, hematuria  Ext: no leg edema. Has diffused joint pain.  Neuro: no unilateral weakness, numbness, or tingling, no vision change or hearing loss Skin: no rash MSK: No muscle spasm, no deformity, no limitation of range of movement in spin Heme:  No easy bruising.  Travel history: No recent long distant travel.  Allergy:  Allergies  Allergen Reactions  . Penicillins Anaphylaxis  . Erythromycin Other (See Comments)    SEVERE STOMACH CRAMPS  . Morphine And Related Nausea And Vomiting  . Nsaids Other (See Comments)    SEVERE STOMACH CRAMPS  . Nickel Rash    Past Medical History  Diagnosis Date  . OA (osteoarthritis)     RIGHT SHOULDER  . Right rotator cuff tear   . Hyperlipidemia   . Allergic rhinitis   . Unspecified essential hypertension   . Migraine headache   . 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 History of iron deficiency anemia   . History of migraine headaches   . Frequency of urination   . Recovering alcoholic     SINCE [id number]    Past Surgical History  Procedure Laterality Date  . Tonsillectomy and adenoidectomy  AGE 67  . Vaginal hysterectomy  1976  . Total knee arthroplasty Bilateral LEFT  1996/   RIGHT 2004  . Replacement total knee Left 2006  . Knee arthroscopy w/ meniscectomy Bilateral X2  LEFT /    X1  RIGHT  . Cataract extraction w/ intraocular lens  implant, bilateral    . Bunionectomy/  hammertoe correction  right foot  2011  . Shoulder arthroscopy with subacromial decompression, rotator cuff repair and bicep tendon repair Right 05/23/2013    Procedure: RIGHT SHOULDER ARTHROSCOPY EXAM UNDER ANESTHESIA  WITH SUBACROMIAL DECOMPRESSION,DISTAL CLAVICLE RESECTION, SADLABRAL DEBRIDEMENT CHONDROPLASTY, BICEP TENOTOMY ;  Surgeon: Sydnee Cabal, MD;  Location: [place];  Service: Orthopedics;  Laterality: Right;    Social History:  reports that she quit smoking about 29 years ago. Her smoking use included Cigarettes. She has a 7.5 pack-year smoking history. She has never used smokeless tobacco. She reports that she does not drink alcohol or use illicit drugs.  Family History:  Family History  Problem Relation Age of Onset  . Heart disease Father 36   . Hypertension Father   . Cancer Sister     skin CA, 2 sisters     Prior to Admission medications   Medication Sig Start Date End Date Taking? Authorizing Provider  Calcium Carb-Cholecalciferol (CALCIUM 600 + D PO) Take 2 tablets by mouth daily.   Yes Historical Provider, MD  citalopram (CELEXA) 40 MG tablet Take 1 tablet (40 mg total) by mouth daily. 10/01/14  Yes [name], NP  inFLIXimab (REMICADE) 100 MG injection Inject 100 mg into the vein every 3 (three) months.   Yes Historical Provider, MD  lisinopril (PRINIVIL,ZESTRIL) 10 MG tablet Take 1 tablet (10 mg total) by mouth every morning. 11/16/13  Yes Dorena Cookey, MD  methylphenidate (RITALIN) 20 MG tablet Take 1 tablet (20 mg total) by mouth 2 (two) times daily. Patient taking differently: Take 20 mg by mouth 2 (two) times daily as needed. For attention 10/04/14  Yes Marletta Lor, MD  Multiple Vitamins-Minerals (MULTIVITAMIN WITH MINERALS) tablet Take 1 tablet by mouth daily.   Yes Historical Provider, MD  Alum & Mag Hydroxide-Simeth (MAGIC MOUTHWASH W/LIDOCAINE) SOLN Take 5 mLs by mouth 3 (three) times daily as needed for mouth pain. 10/01/14   [name], NP  folic acid (FOLVITE) 1 MG tablet Take 1 mg by mouth daily. 11/16/13   Historical Provider, MD  HYDROcodone-acetaminophen (NORCO) 7.5-325 MG per tablet Take 1 tablet by mouth 2 (two) times daily. 01/02/14   Dorena Cookey, MD  methocarbamol (ROBAXIN) 500 MG tablet TAKE 1 TABLET BY MOUTH EVERY 6 TO 8 HOURS AS NEEDED FOR SPASMS 06/20/14   Historical Provider, MD  methotrexate (RHEUMATREX) 2.5 MG tablet  11/16/13   Historical Provider, MD  methylphenidate (RITALIN) 20 MG tablet Take 1 tablet (20 mg total) by mouth 2 (two) times daily. Patient not taking: Reported on 10/29/2014 10/04/14   Marletta Lor, MD  methylphenidate (RITALIN) 20 MG tablet Take 1 tablet (20 mg total) by mouth 2 (two) times daily. Patient not taking: Reported on 10/29/2014 10/04/14   Marletta Lor, MD  zoster vaccine live, PF, (ZOSTAVAX) 57262 UNT/0.65ML injection Inject 19,400 Units into the skin once. 09/25/13   Dorena Cookey, MD    Physical Exam: Filed Vitals:   10/29/14 0130 10/29/14 0200 10/29/14 0255 10/29/14 0330  BP: 123/44 139/56  122/63  Pulse: 97 95  81  Temp:      TempSrc:      Resp: '20 17  17  ' Weight:   97.5 kg (214 lb 15.2 oz)   SpO2: 93% 94%  96%   General: Not in acute distress HEENT:       Eyes: PERRL, EOMI, no scleral icterus.       ENT: No discharge from the ears and nose, no pharynx injection, no tonsillar enlargement.        Neck: No JVD, no bruit, no mass felt. Heme: No neck lymph node enlargement. Cardiac: S1/S2, RRR, No murmurs, No gallops or rubs. Pulm: Good air movement bilaterally. No rales, wheezing, rhonchi or rubs. Abd: Soft, nondistended, nontender, no rebound pain, no organomegaly, BS present.  Ext: No pitting leg edema bilaterally. 2+DP/PT pulse bilaterally. Musculoskeletal:  Has finger deformities, has diffused joint pain, no joint redness or warmth, no significant joint effusion. Skin: No rashes.  Neuro: Alert, oriented X3, cranial nerves II-XII grossly intact, muscle strength 5/5 in all extremities, sensation to light touch intact.  Psych: Patient is not psychotic, no suicidal or hemocidal ideation.  Labs on Admission:  Basic Metabolic Panel:  Recent Labs Lab 10/28/14 2339  NA 132*  K 3.6  CL 101  CO2 21*  GLUCOSE 122*  BUN 10  CREATININE 0.54  CALCIUM 8.6*   Liver Function Tests:  Recent Labs Lab 10/28/14 2339  AST 33  ALT 39  ALKPHOS 72  BILITOT 1.1  PROT 7.2  ALBUMIN 3.9   No results for input(s): LIPASE, AMYLASE in the last 168 hours. No results for input(s): AMMONIA in the last 168 hours. CBC:  Recent Labs Lab 10/29/14 0008  WBC 11.6*  NEUTROABS 10.1*  HGB 11.0*  HCT 33.4*  MCV 96.5  PLT 244   Cardiac Enzymes: No results for input(s): CKTOTAL, CKMB, CKMBINDEX, TROPONINI in the last 168  hours.  BNP (last 3 results) No results for input(s): BNP in the last 8760 hours.  ProBNP (last 3 results) No results for input(s): PROBNP in the last 8760 hours.  CBG: No results for input(s): GLUCAP in the last 168 hours.  Radiological Exams on Admission: Dg Chest 2 View  10/29/2014   CLINICAL DATA:  Fever for 2 days.  EXAM: CHEST  2 VIEW  COMPARISON:  07/12/2007  FINDINGS: The cardiomediastinal contours are normal. Pulmonary vasculature is normal. No consolidation, pleural effusion, or pneumothorax. Small calcified granuloma in the right upper lung, partially obscured by overlying osseous structures. No acute osseous abnormalities are seen.  IMPRESSION: No acute pulmonary process.   Electronically Signed   By: Jeb Levering M.D.   On: 10/29/2014 01:01    EKG: Independently reviewed.  QTC 467, no ischemic change.  Assessment/Plan Principal Problem:   SIRS (systemic inflammatory response syndrome) Active Problems:   Migraine   Essential hypertension, benign   Allergic rhinitis   Osteoarthritis   ADD (attention deficit disorder)   Depression (emotion)   Sepsis   Fever   Joint pain   Serum sickness  Serum sickness and SIRS: the most likely etiology for patient's fever and diffuse joint pain is Serum sickness secondary to Remicade use. Patient meets criteria for SIRS with leukocytosis, tachycardia and fever. Sepsis is an another potent differential diagnosis, but no source of infection was identified, therefore sepsis is less likely though it cannot completely be ruled out at this time. Patient has negative urinalysis and chest x-ray. Given her immunosuppressed status, will treat patient with antibiotics empirically and follow up the blood and urine culture. -will admit to tele bed -Solu-Medrol, 60 mg twice a day for possible serum sickness -IV vancomycin plus aztreonam -will get Procalcitonin and trend lactic acid levels per sepsis protocol. -IVF: 2L of NS bolus in ED,  followed by 125 cc/h  -f/u Bx and Ux -ESR, CRP, ANA  Diffused Joint pain: likely due to serum sickness -Solu-Medrol as above -Check RPR -When necessary Norco and Tylenol  HTN: -Hold lisinopril since patient is at risk of developing hypotension -IV hydralazine when necessary  ADD (attention deficit disorder): -Ritalin  Depression: Stable, no suicidal or homicidal ideations. -Continue home medications: Celexa   DVT ppx: SQ Heparin  Code Status: Full code Family Communication: None at bed side.   Disposition  Plan: Admit to inpatient   Date of Service 10/29/2014    Ivor Costa [facility] Pager [phone]  If 7PM-7AM, please contact night-coverage [email or web address] Password [email or web address] 10/29/2014, 4:02 AM

## 2014-10-29 NOTE — Progress Notes (Addendum)
Patient seen and examined  Patient had infusion of Remicade on 9/6, presented with a fever of 103.1, chills, no focal symptoms  Plan SIRS-follow culture results, continue broad-spectrum antibiotics, discussed with Dr. Amil Amen about patient's admission and she will call the patient and make further recommendations. According to him this, could be serum sickness if all cultures are negative, and maybe she will not be a candidate for future infusions of Remicade  History of psoriatic arthritis-start tapering Solu-Medrol tomorrow  Recent sore throat, check rapid strep and influenza PCR

## 2014-10-29 NOTE — Progress Notes (Signed)
ANTIBIOTIC CONSULT NOTE - INITIAL  Pharmacy Consult for Vancomycin & Aztreonam Indication: Sepsis  Allergies  Allergen Reactions  . Penicillins Anaphylaxis  . Erythromycin Other (See Comments)    SEVERE STOMACH CRAMPS  . Morphine And Related Nausea And Vomiting  . Nsaids Other (See Comments)    SEVERE STOMACH CRAMPS  . Nickel Rash    Patient Measurements: Weight: 214 lb 15.2 oz (97.5 kg)   Vital Signs: Temp: 102.4 F (39.1 C) (09/11 2352) Temp Source: Rectal (09/11 2352) BP: 139/56 mmHg (09/12 0200) Pulse Rate: 95 (09/12 0200) Intake/Output from previous day:   Intake/Output from this shift:    Labs:  Recent Labs  10/28/14 2339 10/29/14 0008  WBC  --  11.6*  HGB  --  11.0*  PLT  --  244  CREATININE 0.54  --    Estimated Creatinine Clearance: 77.4 mL/min (by C-G formula based on Cr of 0.54). No results for input(s): VANCOTROUGH, VANCOPEAK, VANCORANDOM, GENTTROUGH, GENTPEAK, GENTRANDOM, TOBRATROUGH, TOBRAPEAK, TOBRARND, AMIKACINPEAK, AMIKACINTROU, AMIKACIN in the last 72 hours.   Microbiology: No results found for this or any previous visit (from the past 720 hour(s)).  Medical History: Past Medical History  Diagnosis Date  . OA (osteoarthritis)     RIGHT SHOULDER  . Right rotator cuff tear   . Hyperlipidemia   . Allergic rhinitis   . Unspecified essential hypertension   . Migraine headache   . 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 History of iron deficiency anemia   . History of migraine headaches   . Frequency of urination   . Recovering alcoholic     SINCE [id number]    Medications:  Scheduled:  . Calcium Carbonate-Vitamin D  1 tablet Oral Daily  . citalopram  40 mg Oral Daily  . folic acid  1 mg Oral Daily  . HYDROcodone-acetaminophen  1 tablet Oral BID  . multivitamin with minerals  1 tablet Oral Daily   Infusions:  . sodium chloride    . aztreonam    . vancomycin     Assessment:  69 yr female with h/o  arthritis, presents with complaint of fever, chills and joint pain.  Currently receiving Infliximab therapy for arthritis.  Temp = 102.44F  Pharmacy consulted to dose Vancomycin and Aztreonam for sepsis from an unclear source  CrCl ~ 77 ml/min  Blood and urine cultures ordered  Goal of Therapy:  Vancomycin trough level 15-20 mcg/ml  Plan:  Measure antibiotic drug levels at steady state Follow up culture results   Vancomycin 1250mg  IV q12h  Aztreonam 2gm IV q8h  Rukaya Kleinschmidt, Toribio Harbour, PharmD 10/29/2014,2:57 AM

## 2014-10-29 NOTE — ED Notes (Signed)
Patient was given something to drink. Verbal order by PA.

## 2014-10-29 NOTE — Evaluation (Signed)
Physical Therapy One Time Evaluation Patient Details Name: Tonya Bartlett MRN: [id number] DOB: September 19, 1945 Today's Date: 10/29/2014   History of Present Illness  69 y.o. female with PMH of psoriatic arthritis, hypertension, depression, osteoarthritis, DVT, migraine headache, attention deficit, who presents with the fever, chills, diffused joint pain.  Pt started with Remicade infusions 2 weeks ago for psoriatic arthritis.  Pt workup for serum sickness vs SIRS.  Clinical Impression  Patient evaluated by Physical Therapy with no further acute PT needs identified. All education has been completed and the patient has no further questions.  Pt mobilizing well today and reports joint pain has improved since admission.  See below for any follow-up Physical Therapy or equipment needs. PT is signing off. Thank you for this referral.     Follow Up Recommendations No PT follow up    Equipment Recommendations  None recommended by PT    Recommendations for Other Services       Precautions / Restrictions Precautions Precautions: None      Mobility  Bed Mobility Overal bed mobility: Modified Independent                Transfers Overall transfer level: Modified independent                  Ambulation/Gait Ambulation/Gait assistance: Supervision;Modified independent (Device/Increase time) Ambulation Distance (Feet): 400 Feet Assistive device: None Gait Pattern/deviations: Step-through pattern;Decreased stride length     General Gait Details: slightly decreased R LE flexion observed during gait cycle however no LOB or unsteadiness observed, pt denies increase in pain  Stairs            Wheelchair Mobility    Modified Rankin (Stroke Patients Only)       Balance                                             Pertinent Vitals/Pain Pain Assessment: 0-10 Pain Score: 5  Pain Location: joint pain everywhere Pain Descriptors / Indicators: Sore Pain  Intervention(s): Limited activity within patient's tolerance;Monitored during session (no pain at rest)    Home Living Family/patient expects to be discharged to:: Private residence Living Arrangements: Alone           Home Layout: One level Home Equipment: None      Prior Function Level of Independence: Independent               Hand Dominance        Extremity/Trunk Assessment   Upper Extremity Assessment: Overall WFL for tasks assessed (R hand active ROM slightly limited per pt, reports using ball at home, encouraged squeezing washcloth)           Lower Extremity Assessment: Overall WFL for tasks assessed (reports R LE feeling stiffness, slightly noticeable during gait however able to sit to stand with difficulty)      Cervical / Trunk Assessment: Normal  Communication   Communication: No difficulties  Cognition Arousal/Alertness: Awake/alert Behavior During Therapy: WFL for tasks assessed/performed Overall Cognitive Status: Within Functional Limits for tasks assessed                      General Comments      Exercises        Assessment/Plan    PT Assessment Patent does not need any further PT services  PT Diagnosis  PT Problem List    PT Treatment Interventions     PT Goals (Current goals can be found in the Care Plan section) Acute Rehab PT Goals PT Goal Formulation: All assessment and education complete, DC therapy    Frequency     Barriers to discharge        Co-evaluation               End of Session   Activity Tolerance: Patient tolerated treatment well Patient left: in bed;with call bell/phone within reach Nurse Communication: Mobility status         Time: 1059-1110 PT Time Calculation (min) (ACUTE ONLY): 11 min   Charges:   PT Evaluation $Initial PT Evaluation Tier I: 1 Procedure     PT G Codes:        Imane Burrough,KATHrine E 10/29/2014, 12:19 PM Carmelia Bake, PT, DPT 10/29/2014 Pager: [phone]

## 2014-10-30 LAB — URINE CULTURE: Culture: NO GROWTH

## 2014-10-30 LAB — GLUCOSE, CAPILLARY: Glucose-Capillary: 170 mg/dL — ABNORMAL HIGH (ref 65–99)

## 2014-10-30 LAB — ANTINUCLEAR ANTIBODIES, IFA: ANTINUCLEAR ANTIBODIES, IFA: NEGATIVE

## 2014-10-30 LAB — RPR: RPR: NONREACTIVE

## 2014-10-30 MED ORDER — POLYETHYLENE GLYCOL 3350 17 G PO PACK: 17.0000 g | PACK | Freq: Two times a day (BID) | ORAL | Status: DC

## 2014-10-30 MED ORDER — POTASSIUM CHLORIDE CRYS ER 20 MEQ PO TBCR
40.0000 meq | EXTENDED_RELEASE_TABLET | Freq: Once | ORAL | Status: AC
  Administered 2014-10-30: 40 meq via ORAL
  Filled 2014-10-30: qty 2

## 2014-10-30 MED ORDER — LISINOPRIL 10 MG PO TABS: 10.0000 mg | ORAL_TABLET | Freq: Every morning | ORAL | Status: DC

## 2014-10-30 NOTE — Progress Notes (Signed)
OT Cancellation Note  Patient Details Name: CAMIL HAUSMANN MRN: [id number] DOB: 1945/12/13   Cancelled Treatment:    Reason Eval/Treat Not Completed: OT screened, no needs identified, will sign off  Darlina Rumpf [place], OTR/L [phone]  10/30/2014, 11:15 AM

## 2014-10-30 NOTE — Discharge Summary (Signed)
Physician Discharge Summary  Tonya Bartlett MRN: [id number] DOB/AGE: May 26, 1945 69 y.o.  PCP: Joycelyn Man, MD   Admit date: 10/28/2014 Discharge date: 10/30/2014  Discharge Diagnoses:     Principal Problem:   SIRS (systemic inflammatory response syndrome) Active Problems:   Migraine   Essential hypertension, benign   Allergic rhinitis   Osteoarthritis   ADD (attention deficit disorder)   Depression (emotion)   Sepsis   Fever   Joint pain   Serum sickness likely secondary to Remicade    Follow-up recommendations Follow-up with PCP in 3-5 days , including all  additional recommended appointments as below Follow-up CBC, CMP in 3-5 days      Medication List    STOP taking these medications        magic mouthwash w/lidocaine Soln     methocarbamol 500 MG tablet  Commonly known as:  ROBAXIN     methotrexate 2.5 MG tablet  Commonly known as:  RHEUMATREX     REMICADE 100 MG injection  Generic drug:  inFLIXimab     zoster vaccine live (PF) 19400 UNT/0.65ML injection  Commonly known as:  ZOSTAVAX      TAKE these medications        CALCIUM 600 + D PO  Take 2 tablets by mouth daily.     citalopram 40 MG tablet  Commonly known as:  CELEXA  Take 1 tablet (40 mg total) by mouth daily.     folic acid 1 MG tablet  Commonly known as:  FOLVITE  Take 1 mg by mouth daily.     HYDROcodone-acetaminophen 7.5-325 MG per tablet  Commonly known as:  NORCO  Take 1 tablet by mouth 2 (two) times daily.     lisinopril 10 MG tablet  Commonly known as:  PRINIVIL,ZESTRIL  Take 1 tablet (10 mg total) by mouth every morning.  Start taking on:  11/02/2014     methylphenidate 20 MG tablet  Commonly known as:  RITALIN  Take 1 tablet (20 mg total) by mouth 2 (two) times daily.     methylphenidate 20 MG tablet  Commonly known as:  RITALIN  Take 1 tablet (20 mg total) by mouth 2 (two) times daily.     methylphenidate 20 MG tablet  Commonly known as:  RITALIN  Take 1  tablet (20 mg total) by mouth 2 (two) times daily.     multivitamin with minerals tablet  Take 1 tablet by mouth daily.         Discharge Condition: Stable  Disposition: 01-Home or Self Care   Consults:  None    Significant Diagnostic Studies:  Dg Chest 2 View  10/29/2014   CLINICAL DATA:  Fever for 2 days.  EXAM: CHEST  2 VIEW  COMPARISON:  07/12/2007  FINDINGS: The cardiomediastinal contours are normal. Pulmonary vasculature is normal. No consolidation, pleural effusion, or pneumothorax. Small calcified granuloma in the right upper lung, partially obscured by overlying osseous structures. No acute osseous abnormalities are seen.  IMPRESSION: No acute pulmonary process.   Electronically Signed   By: Jeb Levering M.D.   On: 10/29/2014 01:01       Filed Weights   10/29/14 0255 10/29/14 1200  Weight: 97.5 kg (214 lb 15.2 oz) 97.5 kg (214 lb 15.2 oz)     Microbiology: Recent Results (from the past 240 hour(s))  Urine culture     Status: None   Collection Time: 10/29/14  1:25 AM  Result Value Ref Range Status  Specimen Description URINE, RANDOM  Final   Special Requests NONE  Final   Culture   Final    NO GROWTH 1 DAY Performed at [facility]    Report Status 10/30/2014 FINAL  Final  Rapid strep screen (not at [facility])     Status: None   Collection Time: 10/29/14 10:26 AM  Result Value Ref Range Status   Streptococcus, Group A Screen (Direct) NEGATIVE NEGATIVE Final    Comment: (NOTE) A Rapid Antigen test may result negative if the antigen level in the sample is below the detection level of this test. The FDA has not cleared this test as a stand-alone test therefore the rapid antigen negative result has reflexed to a Group A Strep culture.        Blood Culture    Component Value Date/Time   SDES URINE, RANDOM 10/29/2014 0125   SPECREQUEST NONE 10/29/2014 0125   CULT  10/29/2014 0125    NO GROWTH 1 DAY Performed at [facility] 10/30/2014 FINAL 10/29/2014 0125      Labs: Results for orders placed or performed during the hospital encounter of 10/28/14 (from the past 48 hour(s))  Comprehensive metabolic panel     Status: Abnormal   Collection Time: 10/28/14 11:39 PM  Result Value Ref Range   Sodium 132 (L) 135 - 145 mmol/L   Potassium 3.6 3.5 - 5.1 mmol/L   Chloride 101 101 - 111 mmol/L   CO2 21 (L) 22 - 32 mmol/L   Glucose, Bld 122 (H) 65 - 99 mg/dL   BUN 10 6 - 20 mg/dL   Creatinine, Ser 0.54 0.44 - 1.00 mg/dL   Calcium 8.6 (L) 8.9 - 10.3 mg/dL   Total Protein 7.2 6.5 - 8.1 g/dL   Albumin 3.9 3.5 - 5.0 g/dL   AST 33 15 - 41 U/L   ALT 39 14 - 54 U/L   Alkaline Phosphatase 72 38 - 126 U/L   Total Bilirubin 1.1 0.3 - 1.2 mg/dL   GFR calc non Af Amer >60 >60 mL/min   GFR calc Af Amer >60 >60 mL/min    Comment: (NOTE) The eGFR has been calculated using the CKD EPI equation. This calculation has not been validated in all clinical situations. eGFR's persistently <60 mL/min signify possible Chronic Kidney Disease.    Anion gap 10 5 - 15  I-Stat CG4 Lactic Acid, ED  (not at [facility])     Status: None   Collection Time: 10/28/14 11:49 PM  Result Value Ref Range   Lactic Acid, Venous 0.98 0.5 - 2.0 mmol/L  CBC with Differential     Status: Abnormal   Collection Time: 10/29/14 12:08 AM  Result Value Ref Range   WBC 11.6 (H) 4.0 - 10.5 K/uL   RBC 3.46 (L) 3.87 - 5.11 MIL/uL   Hemoglobin 11.0 (L) 12.0 - 15.0 g/dL   HCT 33.4 (L) 36.0 - 46.0 %   MCV 96.5 78.0 - 100.0 fL   MCH 31.8 26.0 - 34.0 pg   MCHC 32.9 30.0 - 36.0 g/dL   RDW 12.2 11.5 - 15.5 %   Platelets 244 150 - 400 K/uL   Neutrophils Relative % 87 (H) 43 - 77 %   Neutro Abs 10.1 (H) 1.7 - 7.7 K/uL   Lymphocytes Relative 6 (L) 12 - 46 %   Lymphs Abs 0.7 0.7 - 4.0 K/uL   Monocytes Relative 7 3 - 12 %   Monocytes Absolute  0.8 0.1 - 1.0 K/uL   Eosinophils Relative 0 0 - 5 %   Eosinophils Absolute 0.0 0.0 - 0.7 K/uL   Basophils Relative 0  0 - 1 %   Basophils Absolute 0.0 0.0 - 0.1 K/uL  Urinalysis, Routine w reflex microscopic (not at [facility])     Status: Abnormal   Collection Time: 10/29/14  1:25 AM  Result Value Ref Range   Color, Urine YELLOW YELLOW   APPearance CLEAR CLEAR   Specific Gravity, Urine 1.012 1.005 - 1.030   pH 6.5 5.0 - 8.0   Glucose, UA NEGATIVE NEGATIVE mg/dL   Hgb urine dipstick TRACE (A) NEGATIVE   Bilirubin Urine NEGATIVE NEGATIVE   Ketones, ur 40 (A) NEGATIVE mg/dL   Protein, ur NEGATIVE NEGATIVE mg/dL   Urobilinogen, UA 1.0 0.0 - 1.0 mg/dL   Nitrite NEGATIVE NEGATIVE   Leukocytes, UA NEGATIVE NEGATIVE  Urine culture     Status: None   Collection Time: 10/29/14  1:25 AM  Result Value Ref Range   Specimen Description URINE, RANDOM    Special Requests NONE    Culture      NO GROWTH 1 DAY Performed at [facility]    Report Status 10/30/2014 FINAL   Urine microscopic-add on     Status: None   Collection Time: 10/29/14  1:25 AM  Result Value Ref Range   Squamous Epithelial / LPF RARE RARE   WBC, UA 0-2 <3 WBC/hpf   RBC / HPF 0-2 <3 RBC/hpf  I-Stat CG4 Lactic Acid, ED  (not at [facility])     Status: Abnormal   Collection Time: 10/29/14  2:25 AM  Result Value Ref Range   Lactic Acid, Venous 0.35 (L) 0.5 - 2.0 mmol/L  RPR     Status: None   Collection Time: 10/29/14  3:00 AM  Result Value Ref Range   RPR Ser Ql Non Reactive Non Reactive    Comment: (NOTE) Performed At: [facility] [place], [place] [id number] Lindon Romp MD [id number]   Sedimentation rate     Status: Abnormal   Collection Time: 10/29/14  3:00 AM  Result Value Ref Range   Sed Rate 50 (H) 0 - 22 mm/hr  C-reactive protein     Status: Abnormal   Collection Time: 10/29/14  3:00 AM  Result Value Ref Range   CRP 11.8 (H) <1.0 mg/dL    Comment: Performed at [facility] metabolic panel     Status: Abnormal   Collection Time: 10/29/14  4:40 AM  Result Value Ref Range   Sodium  137 135 - 145 mmol/L   Potassium 3.5 3.5 - 5.1 mmol/L   Chloride 109 101 - 111 mmol/L   CO2 21 (L) 22 - 32 mmol/L   Glucose, Bld 154 (H) 65 - 99 mg/dL   BUN 9 6 - 20 mg/dL   Creatinine, Ser 0.59 0.44 - 1.00 mg/dL   Calcium 8.1 (L) 8.9 - 10.3 mg/dL   GFR calc non Af Amer >60 >60 mL/min   GFR calc Af Amer >60 >60 mL/min    Comment: (NOTE) The eGFR has been calculated using the CKD EPI equation. This calculation has not been validated in all clinical situations. eGFR's persistently <60 mL/min signify possible Chronic Kidney Disease.    Anion gap 7 5 - 15  CBC     Status: Abnormal   Collection Time: 10/29/14  4:40 AM  Result Value Ref Range   WBC  10.7 (H) 4.0 - 10.5 K/uL   RBC 3.09 (L) 3.87 - 5.11 MIL/uL   Hemoglobin 10.1 (L) 12.0 - 15.0 g/dL   HCT 30.0 (L) 36.0 - 46.0 %   MCV 97.1 78.0 - 100.0 fL   MCH 32.7 26.0 - 34.0 pg   MCHC 33.7 30.0 - 36.0 g/dL   RDW 12.4 11.5 - 15.5 %   Platelets 241 150 - 400 K/uL  CBG monitoring, ED     Status: Abnormal   Collection Time: 10/29/14  7:45 AM  Result Value Ref Range   Glucose-Capillary 117 (H) 65 - 99 mg/dL  Influenza panel by PCR (type A & B, H1N1) (Not at [facility])     Status: None   Collection Time: 10/29/14 10:26 AM  Result Value Ref Range   Influenza A By PCR NEGATIVE NEGATIVE   Influenza B By PCR NEGATIVE NEGATIVE   H1N1 flu by pcr NOT DETECTED NOT DETECTED    Comment:        The Xpert Flu assay (FDA approved for nasal aspirates or washes and nasopharyngeal swab specimens), is intended as an aid in the diagnosis of influenza and should not be used as a sole basis for treatment. Performed at [facility]   Rapid strep screen (not at [facility])     Status: None   Collection Time: 10/29/14 10:26 AM  Result Value Ref Range   Streptococcus, Group A Screen (Direct) NEGATIVE NEGATIVE    Comment: (NOTE) A Rapid Antigen test may result negative if the antigen level in the sample is below the detection level of this test. The FDA  has not cleared this test as a stand-alone test therefore the rapid antigen negative result has reflexed to a Group A Strep culture.   Glucose, capillary     Status: Abnormal   Collection Time: 10/30/14  7:31 AM  Result Value Ref Range   Glucose-Capillary 170 (H) 65 - 99 mg/dL     Lipid Panel     Component Value Date/Time   CHOL 183 11/12/2010 1052   TRIG 66.0 11/12/2010 1052   HDL 65.60 11/12/2010 1052   CHOLHDL 3 11/12/2010 1052   VLDL 13.2 11/12/2010 1052   LDLCALC 104* 11/12/2010 1052   LDLDIRECT 187.9 10/08/2009 0925     No results found for: HGBA1C   Lab Results  Component Value Date   LDLCALC 104* 11/12/2010   CREATININE 0.59 10/29/2014     HPI :*Tonya Bartlett is a 69 y.o. female with PMH of some right cheek arthritis, hypertension, depression, osteoarthritis, DVT, migraine headache, attention deficit, who presents with the fever, chills, diffused joint pain.  Patient reports that because of psoriatic arthritis, she was started with Remicade infusions 2 weeks ago. She has had total of 2 infusions thus far. Patient states that she began to experience body aches 4 days ago and diffused joint pain, including pain over both hands, bilateral hips, bilateral joints and ankles. She also has fever with temperature 103.1 at home today and chills. No new rashes. He does not have cough, chest pain, shortness breath, abdominal pain, diarrhea, symptoms of UTI, unilateral weakness. Patient does not have obvious joint swelling.  In ED, patient was found to have WBC 11.6, temperature while 2.4, tachycardia, negative urinalysis, lactate is 0.98, electrolytes okay, negative chest x-ray. Patient is admitted to inpatient to further evaluate the treatment.   HOSPITAL COURSE:  SIRS-follow culture results, continue broad-spectrum antibiotics, discussed with Dr. Amil Amen about patient's admission and she will  call the patient and make further recommendations. According to him this, could be  serum sickness if all cultures are negative, and maybe she will not be a candidate for future infusions of Remicade  History of psoriatic arthritis-start tapering Solu-Medrol tomorrow  Recent sore throat, negative rapid strep and influenza PCR next  Hypertension-patient advised to hold lisinopril until seen by PCP   Discharge Exam:    Blood pressure 112/82, pulse 67, temperature 98.3 F (36.8 C), temperature source Oral, resp. rate 20, height '5\' 6"'  (1.676 m), weight 97.5 kg (214 lb 15.2 oz), SpO2 98 %.  General: Not in acute distress HEENT:  Eyes: PERRL, EOMI, no scleral icterus.  ENT: No discharge from the ears and nose, no pharynx injection, no tonsillar enlargement.   Neck: No JVD, no bruit, no mass felt. Heme: No neck lymph node enlargement. Cardiac: S1/S2, RRR, No murmurs, No gallops or rubs. Pulm: Good air movement bilaterally. No rales, wheezing, rhonchi or rubs. Abd: Soft, nondistended, nontender, no rebound pain, no organomegaly, BS present. Ext: No pitting leg edema bilaterally. 2+DP/PT pulse bilaterally. Musculoskeletal: Has finger deformities, has diffused joint pain, no joint redness or warmth, no significant joint effusion. Skin: No rashes.  Neuro: Alert, oriented X3, cranial nerves II-XII grossly intact, muscle strength 5/5 in all extremities, sensation to light touch intact.  Psych: Patient is not psychotic, no suicidal or hemocidal ideation.       Discharge Instructions    Diet - low sodium heart healthy    Complete by:  As directed      Increase activity slowly    Complete by:  As directed            Follow-up Information    Follow up with TODD,JEFFREY ALLEN, MD. Schedule an appointment as soon as possible for a visit in 3 days.   Specialty:  Family Medicine   Contact information:   [place] [address] [phone]       Follow up with Hennie Duos, MD. Schedule an appointment as soon as possible for a  visit in 2 weeks.   Specialty:  Rheumatology   Contact information:   [place] [address] [phone]       Signed: Reyne Dumas 10/30/2014, 12:35 PM        Time spent >45 mins

## 2014-10-31 ENCOUNTER — Telehealth: Payer: Self-pay | Admitting: *Deleted

## 2014-10-31 LAB — CULTURE, GROUP A STREP: Strep A Culture: NEGATIVE

## 2014-10-31 NOTE — Telephone Encounter (Signed)
Transition Care Management Follow-up Telephone Call  How have you been since you were released from the hospital? okay   Do you understand why you were in the hospital? yes   Do you understand the discharge instrcutions? yes  Items Reviewed:  Medications reviewed: yes  Allergies reviewed: yes  Dietary changes reviewed: yes  Referrals reviewed: yes   Functional Questionnaire:   Activities of Daily Living (ADLs):   She states they are independent in the following: ambulation, bathing and hygiene, feeding, continence, grooming, toileting and dressing States they require assistance with the following:    Any transportation issues/concerns?: no   Any patient concerns? no   Confirmed importance and date/time of follow-up visits scheduled: yes   Confirmed with patient if condition begins to worsen call PCP or go to the ER.  Patient was given the Call-a-Nurse line [phone]: yes  Patient was discharged 10/30/14 Patient was discharged to home Patient has an appointment 11/06/14 with Dr Sherren Mocha

## 2014-11-03 LAB — CULTURE, BLOOD (ROUTINE X 2)
Culture: NO GROWTH
Culture: NO GROWTH

## 2014-11-06 ENCOUNTER — Ambulatory Visit (INDEPENDENT_AMBULATORY_CARE_PROVIDER_SITE_OTHER): Payer: Medicare Other | Admitting: Family Medicine

## 2014-11-06 ENCOUNTER — Encounter: Payer: Self-pay | Admitting: Family Medicine

## 2014-11-06 ENCOUNTER — Other Ambulatory Visit: Payer: Self-pay | Admitting: Family Medicine

## 2014-11-06 VITALS — BP 140/80 | Temp 98.5°F | Wt 214.0 lb

## 2014-11-06 DIAGNOSIS — L409 Psoriasis, unspecified: Secondary | ICD-10-CM

## 2014-11-06 DIAGNOSIS — M25562 Pain in left knee: Secondary | ICD-10-CM

## 2014-11-06 DIAGNOSIS — Z23 Encounter for immunization: Secondary | ICD-10-CM

## 2014-11-06 DIAGNOSIS — L408 Other psoriasis: Secondary | ICD-10-CM | POA: Diagnosis not present

## 2014-11-06 MED ORDER — BETAMETHASONE 0.6 MG/5ML PO SOLN: 1.2000 mg | Freq: Two times a day (BID) | ORAL | Status: DC

## 2014-11-06 NOTE — Patient Instructions (Addendum)
Continue current medication  Purchase a [employer] pump up digital pressure cuff.......Marland Kitchen [employer],,,,,,,, check your blood pressure weekly in the morning  New blood pressure parameters 140/90 or less  I would call your rheumatologist and asked him to see you ASAP because we do not know what caused your illness  I would also call Dr. Ihor Gully and have him reevaluate your knee personally........ not the PA  Betamethasone....... uses directed for scalp psoriasis  Also we will test you for Lyme's disease

## 2014-11-06 NOTE — Progress Notes (Signed)
   Subjective:    Patient ID: Tonya Bartlett, female    DOB: September 14, 1945, 69 y.o.   MRN: [id number]  HPI Tonya Bartlett is a 69 year old widowed female nonsmoker who comes in today following a hospitalization for fever and severe muscle and joint pain  She was hospitalized from September 11 to the 13th. She presented to the emergency room via EMS because of severe pain and the inability to walk. She also had a high fever. She had been given an infusion of Remicade 2 weeks prior. This was done by her rheumatologist Dr. Amil Amen. She had numerous diagnostic studies all of which were normal except for slight elevation of her sedimentation rate and CRP. She was originally started on broad-spectrum antibiotics thinking she might be septic however that was all negative. Antibiotics were discontinued she was discharged and feels well. Indeed after the hospitalization the pain in her left knee went away. She states she saw Dr. Johnn Hai PA about 2 months ago because of pain and redness and swelling of her left knee. They told her at that time that she would need her knee with advised. Again since her hospitalization the left knee is improved. She wonders if the 2 are not linked. They sound too distant to be related.  She also has a number of tick bites is concerned about Lyme's disease. This was not tested during her hospital stay.  She is on lisinopril daily for hypertension 10 mg. BP normal 130/80  She would also like some betamethasone liquid for the psoriasis of her scalp.  She's also complaining of food getting stuck in her upper esophagus. Occasionally she has to spit it out. This is been going on for about 2-3 months and seems to be getting worse     Review of Systems    review of systems otherwise negative Objective:   Physical Exam  Well-developed well-nourished female no acute distress vital signs stable she's afebrile examination oral cavity is normal neck normal thyroid normal      Assessment &  Plan:  Psoriatic arthritis........ inflammatory response following Remicade......Marland Kitchen etiology unknown..... Refer back to Dr. 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 Ihor Gully  Hypertension at goal........ continue lisinopril 10 mg daily...Marland KitchenMarland KitchenMarland Kitchen monitor blood pressure at home since it tends to go up when she goes to the doctor's office  Difficulty swallowing............ asked her to call her gastroenterologist

## 2014-11-07 ENCOUNTER — Encounter: Payer: Self-pay | Admitting: Gastroenterology

## 2014-11-07 LAB — LYME AB/WESTERN BLOT REFLEX: B BURGDORFERI AB IGG+ IGM: 0.18 {ISR}

## 2014-11-20 DIAGNOSIS — M5030 Other cervical disc degeneration, unspecified cervical region: Secondary | ICD-10-CM | POA: Diagnosis not present

## 2014-11-20 DIAGNOSIS — G5622 Lesion of ulnar nerve, left upper limb: Secondary | ICD-10-CM | POA: Diagnosis not present

## 2014-11-20 DIAGNOSIS — L4050 Arthropathic psoriasis, unspecified: Secondary | ICD-10-CM | POA: Diagnosis not present

## 2014-11-20 DIAGNOSIS — M150 Primary generalized (osteo)arthritis: Secondary | ICD-10-CM | POA: Diagnosis not present

## 2014-11-20 DIAGNOSIS — M25511 Pain in right shoulder: Secondary | ICD-10-CM | POA: Diagnosis not present

## 2014-11-20 DIAGNOSIS — L401 Generalized pustular psoriasis: Secondary | ICD-10-CM | POA: Diagnosis not present

## 2014-11-20 DIAGNOSIS — M5136 Other intervertebral disc degeneration, lumbar region: Secondary | ICD-10-CM | POA: Diagnosis not present

## 2014-12-07 ENCOUNTER — Ambulatory Visit: Payer: Self-pay | Admitting: Gastroenterology

## 2014-12-13 ENCOUNTER — Other Ambulatory Visit: Payer: Self-pay | Admitting: Family Medicine

## 2014-12-19 ENCOUNTER — Other Ambulatory Visit: Payer: Self-pay | Admitting: Family Medicine

## 2014-12-27 ENCOUNTER — Ambulatory Visit: Payer: Self-pay | Admitting: Gastroenterology

## 2014-12-28 ENCOUNTER — Other Ambulatory Visit: Payer: Self-pay

## 2014-12-28 DIAGNOSIS — Z1231 Encounter for screening mammogram for malignant neoplasm of breast: Secondary | ICD-10-CM

## 2015-01-05 ENCOUNTER — Other Ambulatory Visit: Payer: Self-pay | Admitting: Family Medicine

## 2015-01-24 DIAGNOSIS — G5622 Lesion of ulnar nerve, left upper limb: Secondary | ICD-10-CM | POA: Diagnosis not present

## 2015-01-24 DIAGNOSIS — M150 Primary generalized (osteo)arthritis: Secondary | ICD-10-CM | POA: Diagnosis not present

## 2015-01-24 DIAGNOSIS — L401 Generalized pustular psoriasis: Secondary | ICD-10-CM | POA: Diagnosis not present

## 2015-01-24 DIAGNOSIS — M25511 Pain in right shoulder: Secondary | ICD-10-CM | POA: Diagnosis not present

## 2015-01-24 DIAGNOSIS — M5136 Other intervertebral disc degeneration, lumbar region: Secondary | ICD-10-CM | POA: Diagnosis not present

## 2015-01-24 DIAGNOSIS — L4050 Arthropathic psoriasis, unspecified: Secondary | ICD-10-CM | POA: Diagnosis not present

## 2015-01-24 DIAGNOSIS — M5030 Other cervical disc degeneration, unspecified cervical region: Secondary | ICD-10-CM | POA: Diagnosis not present

## 2015-01-31 ENCOUNTER — Ambulatory Visit
Admission: RE | Admit: 2015-01-31 | Discharge: 2015-01-31 | Disposition: A | Discharge status: Home / Self Care | Payer: Medicare Other | Source: Ambulatory Visit

## 2015-01-31 DIAGNOSIS — Z1231 Encounter for screening mammogram for malignant neoplasm of breast: Secondary | ICD-10-CM

## 2015-02-14 ENCOUNTER — Telehealth: Payer: Self-pay | Admitting: Family Medicine

## 2015-02-14 NOTE — Telephone Encounter (Signed)
° ° ° °

## 2015-02-15 MED ORDER — METHYLPHENIDATE HCL 20 MG PO TABS: 20.0000 mg | ORAL_TABLET | Freq: Two times a day (BID) | ORAL | Status: DC

## 2015-02-15 NOTE — Telephone Encounter (Signed)
1 refill ready for pick up and patient is aware.

## 2015-03-14 ENCOUNTER — Telehealth: Payer: Self-pay | Admitting: *Deleted

## 2015-03-14 NOTE — Telephone Encounter (Signed)
Patient calling for a refill of Ritalin.  Okay to give 03/18/15.  Patient only received 1 refill because Dr Sherren Mocha was not in the office.  Okay for 3 prescriptions.

## 2015-03-16 ENCOUNTER — Encounter: Payer: Self-pay | Admitting: Primary Care

## 2015-03-16 ENCOUNTER — Ambulatory Visit (INDEPENDENT_AMBULATORY_CARE_PROVIDER_SITE_OTHER): Payer: Medicare Other | Admitting: Primary Care

## 2015-03-16 VITALS — BP 126/90 | HR 109 | Temp 98.5°F | Wt 215.0 lb

## 2015-03-16 DIAGNOSIS — K5792 Diverticulitis of intestine, part unspecified, without perforation or abscess without bleeding: Secondary | ICD-10-CM

## 2015-03-16 DIAGNOSIS — R112 Nausea with vomiting, unspecified: Secondary | ICD-10-CM

## 2015-03-16 MED ORDER — METRONIDAZOLE 500 MG PO TABS: 500.0000 mg | ORAL_TABLET | Freq: Three times a day (TID) | ORAL | Status: DC

## 2015-03-16 MED ORDER — CIPROFLOXACIN HCL 500 MG PO TABS: 500.0000 mg | ORAL_TABLET | Freq: Two times a day (BID) | ORAL | Status: DC

## 2015-03-16 MED ORDER — ONDANSETRON 4 MG PO TBDP: 4.0000 mg | ORAL_TABLET | Freq: Three times a day (TID) | ORAL | Status: DC | PRN

## 2015-03-16 NOTE — Progress Notes (Signed)
Subjective:    Patient ID: Tonya Bartlett, female    DOB: 1946/01/14, 70 y.o.   MRN: [id number]  HPI  [name] is a 70 year old female who presents today with a chief complaint of abdominal pain. Her pain is located to the epigastric region down to the suprabupic region. She reports nausea, vomiting, low grade fevers, and constipation.   Her last bowel movement was 5-6 days ago. She typically will have bowel movements 2 times daily. Her pain began Thursday morning. She had a pork BBQ sandwich Wednesday night which she doesn't usually eat, and a bag of nut mix last week. She does endorse a history of diverticulitis that was found via colonoscopy, but does not recall every experiencing acute flares.  Overall her abdominal pain is worse. She's had a decrease in appetite and has not been able to eat but is able to tolerate fluids if she drinks slowly.    Review of Systems  Constitutional: Positive for fever.  Respiratory: Negative for shortness of breath.   Cardiovascular: Negative for chest pain.  Gastrointestinal: Positive for nausea, vomiting, abdominal pain and constipation. Negative for diarrhea and abdominal distention.       Past Medical History  Diagnosis Date  . OA (osteoarthritis)     RIGHT SHOULDER  . Right rotator cuff tear   . Hyperlipidemia   . Allergic rhinitis   . Unspecified essential hypertension   . Migraine headache   . 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 History of iron deficiency anemia   . History of migraine headaches   . Frequency of urination   . 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 Marital Status: Married    Spouse Name: N/A  . Number of Children: N/A  . Years of Education: N/A   Occupational History  . Not on file.   Social History Main Topics  . Smoking status: Former Smoker -- 0.50 packs/day for 15 years    Types: Cigarettes    Quit date: 05/15/1985  .  Smokeless tobacco: Never Used  . Alcohol Use: No     Comment: RECOVERING ALCOHOLIC--  QUIT [id number]  . Drug Use: No  . Sexual Activity: No   Other Topics Concern  . Not on file   Social History Narrative    Past Surgical History  Procedure Laterality Date  . Tonsillectomy and adenoidectomy  AGE 39  . Vaginal hysterectomy  1976  . Total knee arthroplasty Bilateral LEFT  1996/   RIGHT 2004  . Replacement total knee Left 2006  . Knee arthroscopy w/ meniscectomy Bilateral X2  LEFT /    X1  RIGHT  . Cataract extraction w/ intraocular lens  implant, bilateral    . Bunionectomy/  hammertoe correction  right foot  2011  . Shoulder arthroscopy with subacromial decompression, rotator cuff repair and bicep tendon repair Right 05/23/2013    Procedure: RIGHT SHOULDER ARTHROSCOPY EXAM UNDER ANESTHESIA  WITH SUBACROMIAL DECOMPRESSION,DISTAL CLAVICLE RESECTION, SADLABRAL DEBRIDEMENT CHONDROPLASTY, BICEP TENOTOMY ;  Surgeon: Sydnee Cabal, MD;  Location: [facility];  Service: Orthopedics;  Laterality: Right;    Family History  Problem Relation Age of Onset  . Heart disease Father 92  . Hypertension Father   . Cancer Sister     skin CA, 2 sisters    Allergies  Allergen Reactions  . Penicillins  Anaphylaxis  . Erythromycin Other (See Comments)    SEVERE STOMACH CRAMPS  . Morphine And Related Nausea And Vomiting  . Nsaids Other (See Comments)    SEVERE STOMACH CRAMPS  . Nickel Rash    Current Outpatient Prescriptions on File Prior to Visit  Medication Sig Dispense Refill  . betamethasone (CELESTONE) 0.6 MG/5ML solution Take 10 mLs (1.2 mg total) by mouth 2 (two) times daily. 118 mL 10  . betamethasone dipropionate [id number] % lotion 1 APPLICATION APPLY ON THE SKIN AS DIRECTED APPLY TO SCALP AS NEEDED 60 mL 0  . citalopram (CELEXA) 20 MG tablet TAKE 1 TABLET (20 MG TOTAL) BY MOUTH DAILY. 100 tablet 2  . citalopram (CELEXA) 40 MG tablet Take 1 tablet (40 mg total) by mouth  daily. 30 tablet 3  . lisinopril (PRINIVIL,ZESTRIL) 10 MG tablet Take 1 tablet (10 mg total) by mouth every morning. 90 tablet 3  . methylphenidate (RITALIN) 20 MG tablet Take 1 tablet (20 mg total) by mouth 2 (two) times daily. (Patient taking differently: Take 20 mg by mouth 2 (two) times daily as needed. For attention) 60 tablet 0  . Multiple Vitamins-Minerals (MULTIVITAMIN WITH MINERALS) tablet Take 1 tablet by mouth daily.    Marland Kitchen HYDROcodone-acetaminophen (NORCO) 7.5-325 MG per tablet Take 1 tablet by mouth 2 (two) times daily. (Patient not taking: Reported on 03/16/2015) 60 tablet 0   No current facility-administered medications on file prior to visit.    BP 126/90 mmHg  Pulse 109  Temp(Src) 98.5 F (36.9 C)  Wt 215 lb (97.523 kg)    Objective:   Physical Exam  Constitutional: She appears well-nourished.  Cardiovascular: Normal rate and regular rhythm.   Pulmonary/Chest: Effort normal and breath sounds normal.  Abdominal: Soft. Bowel sounds are normal. There is tenderness in the left lower quadrant. There is no tenderness at McBurney's point and negative Murphy's sign.          Assessment & Plan:  Diverticulitis:  Abdominal pain since Thursday, constipation without bowel movement for 5-6 days, vomiting since Thursday. Exam concerning for diverticulitis as she is very tender to LLQ with fevers, vomiting. Bowel sounds throughout, so suspicion for blockage is low. She is not fecally impacted today. Abdomen soft. She does not appear toxic and I believe she does not require ED evaluation at this time.  She will start with fleet enema today to allow for release of stool. RX for Zofran provided for nausea. RX for Cipro and flagyl course provided for presumed diverticulitis. Clear liquid diet. ED precautions provided. Return to PCP precautions provided.

## 2015-03-16 NOTE — Patient Instructions (Addendum)
Obtain a Fleet Edema to help with constipation.   Your symptoms are likely related to diverticulitis. Start Ciprofloxacin and Metronidazole antibiotics.  Ciprofloxacin: Take 1 tablet by mouth twice daily for 7 days. Metronidazole: Take 1 tablet by mouth three times daily for 7 days.  You may take Zofran tablets as needed for nausea. Take 1 tablet by mouth every 8 hours as needed for nausea and vomiting.   You will need to be on a clear liquid diet for the next several days.  Go to the emergency department if you do not have a bowel movement with the enema, continue to vomit, spike fevers over 102, start feeling worse.  Please follow up with PCP if no improvement in symptoms early next week.  Diverticulitis Diverticulitis is inflammation or infection of small pouches in your colon that form when you have a condition called diverticulosis. The pouches in your colon are called diverticula. Your colon, or large intestine, is where water is absorbed and stool is formed. Complications of diverticulitis can include:  Bleeding.  Severe infection.  Severe pain.  Perforation of your colon.  Obstruction of your colon. CAUSES  Diverticulitis is caused by bacteria. Diverticulitis happens when stool becomes trapped in diverticula. This allows bacteria to grow in the diverticula, which can lead to inflammation and infection. RISK FACTORS People with diverticulosis are at risk for diverticulitis. Eating a diet that does not include enough fiber from fruits and vegetables may make diverticulitis more likely to develop. SYMPTOMS  Symptoms of diverticulitis may include:  Abdominal pain and tenderness. The pain is normally located on the left side of the abdomen, but may occur in other areas.  Fever and chills.  Bloating.  Cramping.  Nausea.  Vomiting.  Constipation.  Diarrhea.  Blood in your stool. DIAGNOSIS  Your health care provider will ask you about your medical history and do  a physical exam. You may need to have tests done because many medical conditions can cause the same symptoms as diverticulitis. Tests may include:  Blood tests.  Urine tests.  Imaging tests of the abdomen, including X-rays and CT scans. When your condition is under control, your health care provider may recommend that you have a colonoscopy. A colonoscopy can show how severe your diverticula are and whether something else is causing your symptoms. TREATMENT  Most cases of diverticulitis are mild and can be treated at home. Treatment may include:  Taking over-the-counter pain medicines.  Following a clear liquid diet.  Taking antibiotic medicines by mouth for 7-10 days. More severe cases may be treated at a hospital. Treatment may include:  Not eating or drinking.  Taking prescription pain medicine.  Receiving antibiotic medicines through an IV tube.  Receiving fluids and nutrition through an IV tube.  Surgery. HOME CARE INSTRUCTIONS   Follow your health care provider's instructions carefully.  Follow a full liquid diet or other diet as directed by your health care provider. After your symptoms improve, your health care provider may tell you to change your diet. He or she may recommend you eat a high-fiber diet. Fruits and vegetables are good sources of fiber. Fiber makes it easier to pass stool.  Take fiber supplements or probiotics as directed by your health care provider.  Only take medicines as directed by your health care provider.  Keep all your follow-up appointments. SEEK MEDICAL CARE IF:   Your pain does not improve.  You have a hard time eating food.  Your bowel movements do not return to  normal. SEEK IMMEDIATE MEDICAL CARE IF:   Your pain becomes worse.  Your symptoms do not get better.  Your symptoms suddenly get worse.  You have a fever.  You have repeated vomiting.  You have bloody or black, tarry stools. MAKE SURE YOU:   Understand these  instructions.  Will watch your condition.  Will get help right away if you are not doing well or get worse.   This information is not intended to replace advice given to you by your health care provider. Make sure you discuss any questions you have with your health care provider.   Document Released: 11/12/2004 Document Revised: 02/07/2013 Document Reviewed: 12/28/2012 Elsevier Interactive Patient Education [employer].

## 2015-03-18 MED ORDER — METHYLPHENIDATE HCL 20 MG PO TABS: 20.0000 mg | ORAL_TABLET | Freq: Two times a day (BID) | ORAL | Status: DC

## 2015-03-18 NOTE — Telephone Encounter (Signed)
rx ready for pick up and patient is aware  

## 2015-03-25 DIAGNOSIS — M25522 Pain in left elbow: Secondary | ICD-10-CM | POA: Diagnosis not present

## 2015-03-25 DIAGNOSIS — M79642 Pain in left hand: Secondary | ICD-10-CM | POA: Diagnosis not present

## 2015-04-22 ENCOUNTER — Telehealth: Payer: Self-pay | Admitting: *Deleted

## 2015-04-22 DIAGNOSIS — H9190 Unspecified hearing loss, unspecified ear: Secondary | ICD-10-CM

## 2015-04-22 MED ORDER — ZOSTER VACCINE LIVE 19400 UNT/0.65ML ~~LOC~~ SOLR: 0.6500 mL | Freq: Once | SUBCUTANEOUS | Status: DC

## 2015-04-22 NOTE — Telephone Encounter (Signed)
Referral placed and Rx sent

## 2015-04-26 DIAGNOSIS — M5136 Other intervertebral disc degeneration, lumbar region: Secondary | ICD-10-CM | POA: Diagnosis not present

## 2015-04-26 DIAGNOSIS — L401 Generalized pustular psoriasis: Secondary | ICD-10-CM | POA: Diagnosis not present

## 2015-04-26 DIAGNOSIS — G5622 Lesion of ulnar nerve, left upper limb: Secondary | ICD-10-CM | POA: Diagnosis not present

## 2015-04-26 DIAGNOSIS — M150 Primary generalized (osteo)arthritis: Secondary | ICD-10-CM | POA: Diagnosis not present

## 2015-04-26 DIAGNOSIS — M25511 Pain in right shoulder: Secondary | ICD-10-CM | POA: Diagnosis not present

## 2015-04-26 DIAGNOSIS — L4050 Arthropathic psoriasis, unspecified: Secondary | ICD-10-CM | POA: Diagnosis not present

## 2015-04-26 DIAGNOSIS — M5030 Other cervical disc degeneration, unspecified cervical region: Secondary | ICD-10-CM | POA: Diagnosis not present

## 2015-04-29 ENCOUNTER — Other Ambulatory Visit: Payer: Self-pay | Admitting: Adult Health

## 2015-05-13 DIAGNOSIS — H90A21 Sensorineural hearing loss, unilateral, right ear, with restricted hearing on the contralateral side: Secondary | ICD-10-CM | POA: Diagnosis not present

## 2015-05-13 DIAGNOSIS — H90A32 Mixed conductive and sensorineural hearing loss, unilateral, left ear with restricted hearing on the contralateral side: Secondary | ICD-10-CM | POA: Diagnosis not present

## 2015-05-13 DIAGNOSIS — Z822 Family history of deafness and hearing loss: Secondary | ICD-10-CM | POA: Diagnosis not present

## 2015-05-13 DIAGNOSIS — H9313 Tinnitus, bilateral: Secondary | ICD-10-CM | POA: Diagnosis not present

## 2015-05-14 ENCOUNTER — Other Ambulatory Visit: Payer: Self-pay | Admitting: *Deleted

## 2015-05-14 NOTE — Telephone Encounter (Signed)
Spoke with patient and she is going to get her shingles vaccine at [facility].

## 2015-05-27 ENCOUNTER — Other Ambulatory Visit: Payer: Self-pay | Admitting: Family Medicine

## 2015-05-27 DIAGNOSIS — M79671 Pain in right foot: Secondary | ICD-10-CM | POA: Diagnosis not present

## 2015-05-27 DIAGNOSIS — M25571 Pain in right ankle and joints of right foot: Secondary | ICD-10-CM | POA: Diagnosis not present

## 2015-05-27 DIAGNOSIS — M7751 Other enthesopathy of right foot: Secondary | ICD-10-CM | POA: Diagnosis not present

## 2015-06-10 DIAGNOSIS — M79671 Pain in right foot: Secondary | ICD-10-CM | POA: Diagnosis not present

## 2015-06-14 ENCOUNTER — Other Ambulatory Visit: Payer: Self-pay | Admitting: Adult Health

## 2015-06-14 ENCOUNTER — Ambulatory Visit (INDEPENDENT_AMBULATORY_CARE_PROVIDER_SITE_OTHER): Payer: Medicare Other | Admitting: Adult Health

## 2015-06-14 ENCOUNTER — Encounter: Payer: Self-pay | Admitting: Adult Health

## 2015-06-14 VITALS — BP 138/68 | Temp 98.1°F | Wt 216.5 lb

## 2015-06-14 DIAGNOSIS — R0781 Pleurodynia: Secondary | ICD-10-CM

## 2015-06-14 MED ORDER — TIZANIDINE HCL 4 MG PO TABS: 4.0000 mg | ORAL_TABLET | Freq: Four times a day (QID) | ORAL | Status: DC | PRN

## 2015-06-14 NOTE — Patient Instructions (Signed)
It was great seeing you today!  I will follow up with you regarding your chest x ray.   Use the muscle relaxer at night, as it may make you sleepy.   Follow up for physical.
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
## 2015-06-24 DIAGNOSIS — Z4789 Encounter for other orthopedic aftercare: Secondary | ICD-10-CM | POA: Diagnosis not present

## 2015-07-04 ENCOUNTER — Ambulatory Visit (INDEPENDENT_AMBULATORY_CARE_PROVIDER_SITE_OTHER)
Admission: RE | Admit: 2015-07-04 | Discharge: 2015-07-04 | Disposition: A | Discharge status: Home / Self Care | Payer: Medicare Other | Source: Ambulatory Visit | Attending: Adult Health | Admitting: Adult Health

## 2015-07-04 ENCOUNTER — Encounter: Payer: Self-pay | Admitting: Gastroenterology

## 2015-07-04 DIAGNOSIS — R0781 Pleurodynia: Secondary | ICD-10-CM | POA: Diagnosis not present

## 2015-08-30 ENCOUNTER — Encounter: Payer: Self-pay | Admitting: Adult Health

## 2015-08-30 ENCOUNTER — Ambulatory Visit (INDEPENDENT_AMBULATORY_CARE_PROVIDER_SITE_OTHER): Payer: Medicare Other | Admitting: Adult Health

## 2015-08-30 VITALS — BP 150/82 | Temp 98.6°F | Wt 213.6 lb

## 2015-08-30 DIAGNOSIS — Z760 Encounter for issue of repeat prescription: Secondary | ICD-10-CM

## 2015-08-30 DIAGNOSIS — F05 Delirium due to known physiological condition: Secondary | ICD-10-CM | POA: Diagnosis not present

## 2015-08-30 DIAGNOSIS — R4189 Other symptoms and signs involving cognitive functions and awareness: Secondary | ICD-10-CM

## 2015-08-30 MED ORDER — CITALOPRAM HYDROBROMIDE 40 MG PO TABS: 40.0000 mg | ORAL_TABLET | Freq: Every day | ORAL | Status: DC

## 2015-08-30 MED ORDER — BETAMETHASONE DIPROPIONATE 0.05 % EX LOTN: TOPICAL_LOTION | Freq: Two times a day (BID) | CUTANEOUS | Status: DC

## 2015-08-30 NOTE — Progress Notes (Signed)
Subjective:    Patient ID: Tonya Bartlett, female    DOB: Jun 20, 1945, 70 y.o.   MRN: [id number]  HPI  70 year old female who presents to the office today for what she feels as though cognitive impairment. She reports that over the last 6-8 months she has found that she has been having word finding issues, having trouble getting words out, she is having trouble driving down streets that she has driven down numerous times before yet for a few seconds she has cannot remember where she is and is having trouble remember names.   She was not concerned until about a month ago when she forgot to send in her hours for her license.   She has called [facility] and was told that she needed to have a referral.   Review of Systems  Constitutional: Negative.   Respiratory: Negative.   Cardiovascular: Negative.   Gastrointestinal: Negative.   Psychiatric/Behavioral: Positive for confusion and decreased concentration. Negative for sleep disturbance. The patient is not nervous/anxious.    Past Medical History  Diagnosis Date  . OA (osteoarthritis)     RIGHT SHOULDER  . Right rotator cuff tear   . Hyperlipidemia   . Allergic rhinitis   . Unspecified essential hypertension   . Migraine headache   . 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 History of iron deficiency anemia   . History of migraine headaches   . Frequency of urination   . 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 Marital Status: Married    Spouse Name: N/A  . Number of Children: N/A  . Years of Education: N/A   Occupational History  . Not on file.   Social History Main Topics  . Smoking status: Former Smoker -- 0.50 packs/day for 15 years    Types: Cigarettes    Quit date: 05/15/1985  . Smokeless tobacco: Never Used  . Alcohol Use: No     Comment: RECOVERING ALCOHOLIC--  QUIT [id number]  . Drug Use: No  . Sexual Activity: No   Other  Topics Concern  . Not on file   Social History Narrative    Past Surgical History  Procedure Laterality Date  . Tonsillectomy and adenoidectomy  AGE 1  . Vaginal hysterectomy  1976  . Total knee arthroplasty Bilateral LEFT  1996/   RIGHT 2004  . Replacement total knee Left 2006  . Knee arthroscopy w/ meniscectomy Bilateral X2  LEFT /    X1  RIGHT  . Cataract extraction w/ intraocular lens  implant, bilateral    . Bunionectomy/  hammertoe correction  right foot  2011  . Shoulder arthroscopy with subacromial decompression, rotator cuff repair and bicep tendon repair Right 05/23/2013    Procedure: RIGHT SHOULDER ARTHROSCOPY EXAM UNDER ANESTHESIA  WITH SUBACROMIAL DECOMPRESSION,DISTAL CLAVICLE RESECTION, SADLABRAL DEBRIDEMENT CHONDROPLASTY, BICEP TENOTOMY ;  Surgeon: Sydnee Cabal, MD;  Location: [facility];  Service: Orthopedics;  Laterality: Right;    Family History  Problem Relation Age of Onset  . Heart disease Father 51  . Hypertension Father   . Cancer Sister     skin CA, 2 sisters    Allergies  Allergen Reactions  . Penicillins Anaphylaxis  . Erythromycin Other (See Comments)    SEVERE STOMACH CRAMPS  . Morphine And Related Nausea And Vomiting  . Nsaids Other (See Comments)  SEVERE STOMACH CRAMPS  . Nickel Rash    Current Outpatient Prescriptions on File Prior to Visit  Medication Sig Dispense Refill  . Apremilast (OTEZLA PO) Take by mouth.    . betamethasone dipropionate [id number] % lotion 1 APPLICATION APPLY ON THE SKIN AS DIRECTED APPLY TO SCALP AS NEEDED 60 mL 0  . citalopram (CELEXA) 40 MG tablet TAKE 1 TABLET(40 MG) BY MOUTH DAILY 30 tablet 5  . HYDROcodone-acetaminophen (NORCO) 7.5-325 MG per tablet Take 1 tablet by mouth 2 (two) times daily. (Patient taking differently: Take 1 tablet by mouth 2 (two) times daily. Taking 5mg  tablets) 60 tablet 0  . lisinopril (PRINIVIL,ZESTRIL) 10 MG tablet Take 1 tablet (10 mg total) by mouth every morning. 90  tablet 3  . methylphenidate (RITALIN) 20 MG tablet Take 1 tablet (20 mg total) by mouth 2 (two) times daily. 60 tablet 0  . Multiple Vitamins-Minerals (MULTIVITAMIN WITH MINERALS) tablet Take 1 tablet by mouth daily. Reported on 06/14/2015    . ondansetron (ZOFRAN ODT) 4 MG disintegrating tablet Take 1 tablet (4 mg total) by mouth every 8 (eight) hours as needed for nausea or vomiting. 20 tablet 0  . tiZANidine (ZANAFLEX) 4 MG tablet TAKE 1 TABLET(4 MG) BY MOUTH EVERY 6 HOURS AS NEEDED FOR MUSCLE SPASMS 385 tablet 0   No current facility-administered medications on file prior to visit.    BP 150/82 mmHg  Temp(Src) 98.6 F (37 C) (Oral)  Wt 213 lb 9.6 oz (96.888 kg)       Objective:   Physical Exam  Constitutional: She is oriented to person, place, and time. She appears well-developed and well-nourished. No distress.  Cardiovascular: Normal rate, regular rhythm, normal heart sounds and intact distal pulses.  Exam reveals no gallop and no friction rub.   No murmur heard. Pulmonary/Chest: Effort normal and breath sounds normal. No respiratory distress. She has no wheezes. She has no rales. She exhibits no tenderness.  Neurological: She is alert and oriented to person, place, and time.  Skin: Skin is warm and dry. No rash noted. She is not diaphoretic. No erythema. No pallor.  Psychiatric: She has a normal mood and affect. Her behavior is normal. Judgment and thought content normal.  Nursing note and vitals reviewed.      Assessment & Plan:  1. Cognitive impairment - CBC with Differential/Platelet - TSH - Vitamin 123456 - Basic metabolic panel - Ambulatory referral to Neurology  2. Medication refill - betamethasone dipropionate 0.05 % lotion; Apply topically 2 (two) times daily.  Dispense: 60 mL; Refill: 4 - citalopram (CELEXA) 40 MG tablet; Take 1 tablet (40 mg total) by mouth daily.  Dispense: 90 tablet; Refill: 3  [name], NP

## 2015-08-30 NOTE — Addendum Note (Signed)
Addended by: Elmer Picker on: 08/30/2015 03:31 PM   Modules accepted: Orders

## 2015-08-30 NOTE — Patient Instructions (Signed)
It was great seeing you again.   Someone will call you from Neurology to set up the appointment  I will follow up with you regarding your blood work. Please let me know if you need anything   Happy Birthday!!!!

## 2015-08-31 LAB — TSH: TSH: 1.4 m[IU]/L

## 2015-08-31 LAB — CBC WITH DIFFERENTIAL/PLATELET
Basophils Absolute: 77 cells/uL (ref 0–200)
Basophils Relative: 1 %
Eosinophils Absolute: 77 cells/uL (ref 15–500)
Eosinophils Relative: 1 %
HCT: 37.2 % (ref 35.0–45.0)
Hemoglobin: 12.3 g/dL (ref 11.7–15.5)
Lymphocytes Relative: 21 %
Lymphs Abs: 1617 cells/uL (ref 850–3900)
MCH: 31.8 pg (ref 27.0–33.0)
MCHC: 33.1 g/dL (ref 32.0–36.0)
MCV: 96.1 fL (ref 80.0–100.0)
MPV: 10 fL (ref 7.5–12.5)
Monocytes Absolute: 539 cells/uL (ref 200–950)
Monocytes Relative: 7 %
Neutro Abs: 5390 cells/uL (ref 1500–7800)
Neutrophils Relative %: 70 %
Platelets: 354 10*3/uL (ref 140–400)
RBC: 3.87 MIL/uL (ref 3.80–5.10)
RDW: 13.3 % (ref 11.0–15.0)
WBC: 7.7 10*3/uL (ref 3.8–10.8)

## 2015-08-31 LAB — BASIC METABOLIC PANEL
BUN: 14 mg/dL (ref 7–25)
CO2: 24 mmol/L (ref 20–31)
Calcium: 9.1 mg/dL (ref 8.6–10.4)
Chloride: 100 mmol/L (ref 98–110)
Creat: 0.68 mg/dL (ref 0.60–0.93)
Glucose, Bld: 90 mg/dL (ref 65–99)
Potassium: 4 mmol/L (ref 3.5–5.3)
Sodium: 134 mmol/L — ABNORMAL LOW (ref 135–146)

## 2015-08-31 LAB — VITAMIN B12: Vitamin B-12: 493 pg/mL (ref 200–1100)

## 2015-09-02 ENCOUNTER — Encounter: Payer: Self-pay | Admitting: Gastroenterology

## 2015-09-02 ENCOUNTER — Ambulatory Visit (INDEPENDENT_AMBULATORY_CARE_PROVIDER_SITE_OTHER): Payer: Medicare Other | Admitting: Gastroenterology

## 2015-09-02 VITALS — BP 136/90 | HR 68 | Ht 66.25 in | Wt 212.6 lb

## 2015-09-02 DIAGNOSIS — Z800 Family history of malignant neoplasm of digestive organs: Secondary | ICD-10-CM | POA: Diagnosis not present

## 2015-09-02 DIAGNOSIS — K219 Gastro-esophageal reflux disease without esophagitis: Secondary | ICD-10-CM

## 2015-09-02 DIAGNOSIS — R1310 Dysphagia, unspecified: Secondary | ICD-10-CM | POA: Diagnosis not present

## 2015-09-02 MED ORDER — OMEPRAZOLE 40 MG PO CPDR: 40.0000 mg | DELAYED_RELEASE_CAPSULE | Freq: Every day | ORAL | Status: DC

## 2015-09-02 NOTE — Patient Instructions (Signed)
You have been scheduled for an endoscopy. Please follow written instructions given to you at your visit today. If you use inhalers (even only as needed), please bring them with you on the day of your procedure. Your physician has requested that you go to www.startemmi.com and enter the access code given to you at your visit today. This web site gives a general overview about your procedure. However, you should still follow specific instructions given to you by our office regarding your preparation for the procedure.  We have sent the following medications to your pharmacy for you to pick up at your convenience: Omeprazole 40 mg   If you are age 75 or older, your body mass index should be between 23-30. Your Body mass index is 34.05 kg/(m^2). If this is out of the aforementioned range listed, please consider follow up with your Primary Care Provider.  If you are age 1 or younger, your body mass index should be between 19-25. Your Body mass index is 34.05 kg/(m^2). If this is out of the aformentioned range listed, please consider follow up with your Primary Care Provider.   Thank you for choosing [facility]  Dr. Lisman Cellar

## 2015-09-02 NOTE — Progress Notes (Signed)
HPI :  70 y/o female here for new patient evaluation for "changes in swallowing".   She reports she has a change in her swallowing which started about 4-5 months ago, and developed cough / sense of needing to clear her throat during this time as well. She does have some reflux symptoms of pyrosis which can occur at times but not significant. She has rare regurgitation. She has a sense of new sensation in her throat with altered swallowing - she feels it is harder to swallow but not necessarily has food get caught in any particular place. No odynophagia. She is able to eat okay, but she thinks it can occur with each swallow. She denies any medications for reflux / heartburn. She has a brother had esophageal cancer who passed away from it at age 41 . She previously used tobacco, quit in the 1980s. She has never had a prior upper endoscopy. She denies seasonal allergy, rhinorrhea.   Colonoscopy 10/29/06 - diverticulosis, otherwise normal  Past Medical History  Diagnosis Date  . OA (osteoarthritis)     RIGHT SHOULDER  . Right rotator cuff tear   . Hyperlipidemia   . Allergic rhinitis   . Unspecified essential hypertension   . Migraine headache   . 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 History of iron deficiency anemia   . History of migraine headaches   . Frequency of urination   . Recovering alcoholic ([place])     SINCE [id number]  . Diverticulosis      Past Surgical History  Procedure Laterality Date  . Tonsillectomy and adenoidectomy  AGE 44  . Vaginal hysterectomy  1976  . Total knee arthroplasty Bilateral LEFT  1996/   RIGHT 2004  . Replacement total knee Left 2006  . Knee arthroscopy w/ meniscectomy Bilateral X2  LEFT /    X1  RIGHT  . Cataract extraction w/ intraocular lens  implant, bilateral    . Bunionectomy/  hammertoe correction  right foot  2011  . Shoulder arthroscopy with subacromial decompression, rotator cuff repair and bicep tendon  repair Right 05/23/2013    Procedure: RIGHT SHOULDER ARTHROSCOPY EXAM UNDER ANESTHESIA  WITH SUBACROMIAL DECOMPRESSION,DISTAL CLAVICLE RESECTION, SADLABRAL DEBRIDEMENT CHONDROPLASTY, BICEP TENOTOMY ;  Surgeon: Sydnee Cabal, MD;  Location: [facility];  Service: Orthopedics;  Laterality: Right;   Family History  Problem Relation Age of Onset  . Heart disease Father 47  . Hypertension Father   . Cancer Sister     skin CA, 2 sisters  . Esophageal cancer Brother    Social History  Substance Use Topics  . Smoking status: Former Smoker -- 0.50 packs/day for 15 years    Types: Cigarettes    Quit date: 05/15/1985  . Smokeless tobacco: Never Used  . Alcohol Use: No     Comment: RECOVERING ALCOHOLIC--  QUIT [id number]   Current Outpatient Prescriptions  Medication Sig Dispense Refill  . acetaminophen (TYLENOL) 325 MG tablet Take 325 mg by mouth every 6 (six) hours as needed.    . betamethasone dipropionate 0.05 % lotion Apply topically 2 (two) times daily. 60 mL 4  . calcium citrate-vitamin D (CITRACAL+D) 315-200 MG-UNIT tablet Take 1 tablet by mouth 2 (two) times daily.    . citalopram (CELEXA) 40 MG tablet Take 1 tablet (40 mg total) by mouth daily. 90 tablet 3  . ibuprofen (ADVIL,MOTRIN) 200 MG tablet Take 200 mg by mouth every 6 (  six) hours as needed.    Marland Kitchen lisinopril (PRINIVIL,ZESTRIL) 10 MG tablet Take 1 tablet (10 mg total) by mouth every morning. 90 tablet 3  . Multiple Vitamins-Minerals (MULTIVITAMIN WITH MINERALS) tablet Take 1 tablet by mouth daily. Reported on 06/14/2015    . ondansetron (ZOFRAN ODT) 4 MG disintegrating tablet Take 1 tablet (4 mg total) by mouth every 8 (eight) hours as needed for nausea or vomiting. 20 tablet 0  . potassium chloride (K-DUR,KLOR-CON) 10 MEQ tablet Take 10 mEq by mouth 2 (two) times daily.    Marland Kitchen omeprazole (PRILOSEC) 40 MG capsule Take 1 capsule (40 mg total) by mouth daily. 30 capsule 3   No current facility-administered medications for  this visit.   Allergies  Allergen Reactions  . Penicillins Anaphylaxis  . Erythromycin Other (See Comments)    SEVERE STOMACH CRAMPS  . Morphine And Related Nausea And Vomiting  . Nsaids Other (See Comments)    SEVERE STOMACH CRAMPS  . Nickel Rash     Review of Systems: All systems reviewed and negative except where noted in HPI.   Lab Results  Component Value Date   WBC 7.7 08/30/2015   HGB 12.3 08/30/2015   HCT 37.2 08/30/2015   MCV 96.1 08/30/2015   PLT 354 08/30/2015   Lab Results  Component Value Date   ALT 39 10/28/2014   AST 33 10/28/2014   ALKPHOS 72 10/28/2014   BILITOT 1.1 10/28/2014    Lab Results  Component Value Date   CREATININE 0.68 08/30/2015   BUN 14 08/30/2015   NA 134* 08/30/2015   K 4.0 08/30/2015   CL 100 08/30/2015   CO2 24 08/30/2015      Physical Exam: BP 136/90 mmHg  Pulse 68  Ht 5' 6.25" (1.683 m)  Wt 212 lb 9.6 oz (96.435 kg)  BMI 34.05 kg/m2 Constitutional: Pleasant,well-developed, female in no acute distress. HEENT: Normocephalic and atraumatic. Conjunctivae are normal. No scleral icterus. Neck supple.  Cardiovascular: Normal rate, regular rhythm.  Pulmonary/chest: Effort normal and breath sounds normal. No wheezing, rales or rhonchi. Abdominal: Soft, nondistended, nontender. Bowel sounds active throughout. There are no masses palpable. No hepatomegaly. Extremities: no edema Lymphadenopathy: No cervical adenopathy noted. Neurological: Alert and oriented to person place and time. Skin: Skin is warm and dry. No rashes noted. Psychiatric: Normal mood and affect. Behavior is normal.   ASSESSMENT AND PLAN: 70 y/o female with strong FH of esophageal cancer, presenting with change in swallowing as outlined above, as well as globus, sense of needing to clear her throat. She otherwise has some periodic mild heartburn. Discussed ddx with her and recommend an EGD given her family history and prior tobacco use. Discussed risks /  benefits of EGD and she wished to proceed. Recommend a trial of omeprazole at 40mg  daily in the interim to see if this helps her symptoms. I discussed the long term risks / benefits of PPI therapy. I will give her a 30 day supply as a trial for her symptoms, further recommendations regarding long term use pending her course and EGD findings. She agreed.   Otherwise due for recall colonoscopy in 2018.  [place] Cellar, MD [facility] Pager [phone]  CC: Dorena Cookey, MD

## 2015-09-05 ENCOUNTER — Encounter: Payer: Medicare Other | Admitting: Gastroenterology

## 2015-09-13 DIAGNOSIS — L401 Generalized pustular psoriasis: Secondary | ICD-10-CM | POA: Diagnosis not present

## 2015-09-13 DIAGNOSIS — M25511 Pain in right shoulder: Secondary | ICD-10-CM | POA: Diagnosis not present

## 2015-09-13 DIAGNOSIS — L4050 Arthropathic psoriasis, unspecified: Secondary | ICD-10-CM | POA: Diagnosis not present

## 2015-09-13 DIAGNOSIS — M5136 Other intervertebral disc degeneration, lumbar region: Secondary | ICD-10-CM | POA: Diagnosis not present

## 2015-09-13 DIAGNOSIS — G5622 Lesion of ulnar nerve, left upper limb: Secondary | ICD-10-CM | POA: Diagnosis not present

## 2015-09-13 DIAGNOSIS — M5030 Other cervical disc degeneration, unspecified cervical region: Secondary | ICD-10-CM | POA: Diagnosis not present

## 2015-09-13 DIAGNOSIS — M150 Primary generalized (osteo)arthritis: Secondary | ICD-10-CM | POA: Diagnosis not present

## 2015-09-19 ENCOUNTER — Ambulatory Visit (INDEPENDENT_AMBULATORY_CARE_PROVIDER_SITE_OTHER): Payer: Medicare Other | Admitting: Neurology

## 2015-09-19 ENCOUNTER — Encounter: Payer: Self-pay | Admitting: Neurology

## 2015-09-19 DIAGNOSIS — G3184 Mild cognitive impairment, so stated: Secondary | ICD-10-CM | POA: Diagnosis not present

## 2015-09-19 DIAGNOSIS — R413 Other amnesia: Secondary | ICD-10-CM | POA: Diagnosis not present

## 2015-09-19 MED ORDER — CEREFOLIN NAC 6-90.314-2-600 MG PO TABS: 1.0000 | ORAL_TABLET | Freq: Every day | ORAL | 3 refills | Status: DC

## 2015-09-19 NOTE — Patient Instructions (Addendum)
I had a long discussion with the patient regarding her mild memory and cognitive difficulties t which likely is mild cognitive impairment but reversible causes need to be ruled out. Check MRI scan of the brain, EEG and memory panel labs. Start Cerefolin NAC 1 tablet daily and fish oil. I encouraged the patient to increase participation in activities for cognitive challenge like playing bridge, sudoku and solvecrossword puzzles. She may also consider possible participation in the [place] early dementia trial if interested. We also discussed memory compensation strategies. She will return for follow-up in 2 months or call earlier if necessary.  Management of Memory Problems  There are some general things you can do to help manage your memory problems.  Your memory may not in fact recover, but by using techniques and strategies you will be able to manage your memory difficulties better.  1)  Establish a routine.  Try to establish and then stick to a regular routine.  By doing this, you will get used to what to expect and you will reduce the need to rely on your memory.  Also, try to do things at the same time of day, such as taking your medication or checking your calendar first thing in the morning.  Think about think that you can do as a part of a regular routine and make a list.  Then enter them into a daily planner to remind you.  This will help you establish a routine.  2)  Organize your environment.  Organize your environment so that it is uncluttered.  Decrease visual stimulation.  Place everyday items such as keys or cell phone in the same place every day (ie.  Basket next to front door)  Use post it notes with a brief message to yourself (ie. Turn off light, lock the door)  Use labels to indicate where things go (ie. Which cupboards are for food, dishes, etc.)  Keep a notepad and pen by the telephone to take messages  3)  Memory Aids  A diary or journal/notebook/daily planner  Making a  list (shopping list, chore list, to do list that needs to be done)  Using an alarm as a reminder (kitchen timer or cell phone alarm)  Using cell phone to store information (Notes, Calendar, Reminders)  Calendar/White board placed in a prominent position  Post-it notes  In order for memory aids to be useful, you need to have good habits.  It's no good remembering to make a note in your journal if you don't remember to look in it.  Try setting aside a certain time of day to look in journal.  4)  Improving mood and managing fatigue.  There may be other factors that contribute to memory difficulties.  Factors, such as anxiety, depression and tiredness can affect memory.  Regular gentle exercise can help improve your mood and give you more energy.  Simple relaxation techniques may help relieve symptoms of anxiety  Try to get back to completing activities or hobbies you enjoyed doing in the past.  Learn to pace yourself through activities to decrease fatigue.  Find out about some local support groups where you can share experiences with others.  Try and achieve 7-8 hours of sleep at night.  Mild Neurocognitive Disorder Mild neurocognitive disorder (formerly known as mild cognitive impairment) is a mental disorder. It is a slight abnormal decrease in mental function. The areas of mental function affected may include memory, thought, communication, behavior, and completion of tasks. The decrease is noticeable and  measurable but for the most part does not interfere with your daily activities. Mild neurocognitive disorder typically occurs in people older than 60 years but can occur earlier. It is not as serious as major neurocognitive disorder (formerly known as dementia) but may lead to a more serious neurocognitive disorder. However, in some cases the condition does not get worse. A few people with this disorder even improve. CAUSES  There are a number of different causes of mild  neurocognitive disorder:   Brain disorders associated with abnormal protein deposits, such as Alzheimer's disease, Pick's disease, and Lewy body disease.  Brain disorders associated with abnormal movement, such as Parkinson's disease and Huntington's disease.  Diseases affecting blood vessels in the brain and resulting in mini-strokes.  Certain infections, such as human immunodeficiency virus (HIV) infection.  Traumatic brain injury.  Other medical conditions such as brain tumors, underactive thyroid (hypothyroidism), and vitamin B12 deficiency.  Use of certain prescription medicine and "recreational" drugs. SYMPTOMS  Symptoms of mild neurocognitive disorder include:  Difficulty remembering. You may forget details of recent events, names, or phone numbers. You may forget important social events and appointments or repeatedly forget where you put your car keys.  Difficulty thinking and solving problems. You may have trouble with complex tasks such as paying bills or driving in unfamiliar locations.  Difficulty communicating. You may have trouble finding the right word, naming an object, forming a sentence that makes sense, or understanding what you read or hear.  Changes in your behavior or personality. You may lose interest in the things that you used to enjoy or withdraw from social situations. You may get angry more easily than usual. You may act before thinking. You may do things in public that you would not usually do. You may hear or see things that are not real (hallucinations). You may believe falsely that others are trying to hurt you (paranoia). DIAGNOSIS Mild neurocognitive disorder is diagnosed through an assessment by your health care provider. Your health care provider will ask you and your family, friends, or coworkers questions about your symptoms. He or she will ask how often the symptoms occur, how long they have been occurring, whether they are getting worse, and the  effect they are having on your life. Your health care provider may refer you to a neurologist or mental health specialist for a detailed evaluation of your mental functions (neuropsychological testing).  To identify the cause of your mild neurocognitive disorder, your health care provider may:  Obtain a detailed medical history.  Ask about alcohol and drug use, including prescription medicine.  Perform a physical exam.  Order blood tests and brain imaging exams. TREATMENT  Mild neurocognitive disorder caused by infections, use of certain medicines or "recreational" drugs, and certain medical conditions may improve with treatment of the condition that is causing the disorder. Mild neurocognitive disorder resulting from other causes generally does not improve and may worsen. In these cases, the goal of treatment is to slow progression of the disorder and help you cope with the loss of mental function. Treatments in these cases include:   Medicine. Medicine helps mainly with memory loss and behavioral symptoms.   Talk therapy. Talk therapy provides education, emotional support, memory aids, and other ways of making up for decreases in mental function.   Lifestyle changes. These include regular exercise, a healthy diet (including essential omega-3 fatty acids), intellectual stimulation, and increased social interaction.   This information is not intended to replace advice given to you by  your health care provider. Make sure you discuss any questions you have with your health care provider.   Document Released: 10/05/2012 Document Revised: 02/23/2014 Document Reviewed: 10/05/2012 Elsevier Interactive Patient Education [employer].

## 2015-09-20 ENCOUNTER — Ambulatory Visit: Payer: Medicare Other | Admitting: Adult Health

## 2015-09-20 NOTE — Progress Notes (Signed)
[facility] [address] [place]. [place] [address] [phone]       OFFICE CONSULT NOTE  Tonya. Tonya Bartlett Date of Birth:  March 03, 1945 Medical Record Number:  [id number]   Referring MD:  [name], NP  Reason for Referral:  Memory loss HPI: Tonya Bartlett is a 64 year Caucasian lady who for the last year and a half has been having mild memory and cognitive difficulties. She often misplaces objects but can remember later. She is also having word finding issues and trouble getting her words out. At times she has had trouble driving to places that she has driven down  To numerous times before and yet for a few seconds cannot remember where it done. She is also forgetting names of the patient's. 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 There is no history of strokes seizures significant head injury with loss of consciousness. There is no family history of Alzheimer's dementia. Patient has not had any brain imaging studies done on November done for reversible causes of cognitive impairment. She denies feeling depressed. Past medical history significant for irritable bowel syndrome and syncopal episodes.  ROS:   14 system review of systems is positive for  weight loss, fatigue, hearing loss, trouble swallowing, cough, memory loss, headache, weakness, difficulty swallowing passing out, depression, decreased energy, sleepiness, restless legs and all other systems negative  PMH:  Past Medical History:  Diagnosis Date  . Allergic rhinitis   . Diverticulosis   . Frequency of urination   . 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 History of iron deficiency anemia   . History of migraine headaches   . Hyperlipidemia   . Migraine headache   . OA (osteoarthritis)    RIGHT SHOULDER  .  Recovering alcoholic ([place])    SINCE [id number]  . Right rotator cuff tear   . Unspecified essential hypertension     Social History:  Social History   Social History  . Marital status: Widowed    Spouse name: N/A  . Number of children: N/A  . Years of education: N/A   Occupational History  . Not on file.   Social History Main Topics  . Smoking status: Former Smoker    Packs/day: 0.50    Years: 15.00    Types: Cigarettes    Quit date: 05/15/1985  . Smokeless tobacco: Never Used  . Alcohol use No     Comment: RECOVERING ALCOHOLIC--  QUIT [id number]  . Drug use: No  . Sexual activity: No   Other Topics Concern  . Not on file   Social History Narrative  . No narrative on file    Medications:   Current Outpatient Prescriptions on File Prior to Visit  Medication Sig Dispense Refill  . acetaminophen (TYLENOL) 325 MG tablet Take 325 mg by mouth every 6 (six) hours as needed.    . betamethasone dipropionate 0.05 % lotion Apply topically 2 (two) times daily. 60 mL 4  . calcium citrate-vitamin D (CITRACAL+D) 315-200 MG-UNIT tablet Take 1 tablet by mouth 2 (two) times daily.    . citalopram (CELEXA) 40 MG tablet Take 1 tablet (40 mg total) by mouth daily. 90 tablet 3  . ibuprofen (ADVIL,MOTRIN) 200 MG tablet Take 200 mg by mouth every 6 (six) hours  as needed.    Marland Kitchen lisinopril (PRINIVIL,ZESTRIL) 10 MG tablet Take 1 tablet (10 mg total) by mouth every morning. 90 tablet 3  . Multiple Vitamins-Minerals (MULTIVITAMIN WITH MINERALS) tablet Take 1 tablet by mouth daily. Reported on 06/14/2015    . omeprazole (PRILOSEC) 40 MG capsule Take 1 capsule (40 mg total) by mouth daily. 30 capsule 3  . ondansetron (ZOFRAN ODT) 4 MG disintegrating tablet Take 1 tablet (4 mg total) by mouth every 8 (eight) hours as needed for nausea or vomiting. 20 tablet 0  . potassium chloride (K-DUR,KLOR-CON) 10 MEQ tablet Take 10 mEq by mouth 2 (two) times daily.     No current facility-administered medications  on file prior to visit.     Allergies:   Allergies  Allergen Reactions  . Penicillins Anaphylaxis  . Erythromycin Other (See Comments)    SEVERE STOMACH CRAMPS  . Morphine And Related Nausea And Vomiting  . Nsaids Other (See Comments)    SEVERE STOMACH CRAMPS  . Nickel Rash    Physical Exam General: well developed, well nourished, seated, in no evident distress Head: head normocephalic and atraumatic.   Neck: supple with no carotid or supraclavicular bruits Cardiovascular: regular rate and rhythm, no murmurs Musculoskeletal: no deformity Skin:  no rash/petichiae Vascular:  Normal pulses all extremities  Neurologic Exam Mental Status: Awake and fully alert. Oriented to place and time. Recent and remote memory intact. Attention span, concentration and fund of knowledge appropriate. Mood and affect appropriate. Mini-Mental status exam scored 29/30 with only one deficit in recall. Geriatric depression scale 6 not depressed. Able to name 14 four legged animals. Clock drawing 4/4. Able to copy intersecting pentagons quite well. Cranial Nerves: Fundoscopic exam reveals sharp disc margins. Pupils equal, briskly reactive to light. Extraocular movements full without nystagmus. Visual fields full to confrontation. Hearing intact. Facial sensation intact. Face, tongue, palate moves normally and symmetrically.  Motor: Normal bulk and tone. Normal strength in all tested extremity muscles. Sensory.: intact to touch , pinprick , position and vibratory sensation.  Coordination: Rapid alternating movements normal in all extremities. Finger-to-nose and heel-to-shin performed accurately bilaterally. Gait and Station: Arises from chair without difficulty. Stance is normal. Gait demonstrates normal stride length and balance . Able to heel, toe and tandem walk without difficulty.  Reflexes: 1+ and symmetric. Toes downgoing.       ASSESSMENT: 86 year Caucasian lady with mild memory and cognitive  difficulties likely due to mild cognitive impairment.    PLAN: I had a long discussion with the patient regarding her mild memory and cognitive difficulties t which likely is mild cognitive impairment but reversible causes need to be ruled out. Check MRI scan of the brain, EEG and memory panel labs. Start Cerefolin NAC 1 tablet daily and fish oil. I encouraged the patient to increase participation in activities for cognitive challenge like playing bridge, sudoku and solve crossword puzzles. She may also consider possible participation in the [place] early dementia trial if interested. We also discussed memory compensation strategies. Greater than 50% time during this 45 minute consultation visit was spent on counseling and coordination of care about her memory loss and cognitive impairment. She will return for follow-up in 2 months or call earlier if necessary. Antony Contras, MD  [facility] [address] [place], [place] [address]  Phone [phone] Fax [phone] Note: This document was prepared with digital dictation and possible smart phrase technology. Any transcriptional errors that result from this process are unintentional.

## 2015-09-21 LAB — DEMENTIA PANEL
Homocysteine: 10.7 umol/L (ref 0.0–15.0)
RPR Ser Ql: NONREACTIVE
TSH: 1.94 u[IU]/mL (ref 0.450–4.500)
Vitamin B-12: 380 pg/mL (ref 211–946)

## 2015-09-24 ENCOUNTER — Telehealth: Payer: Self-pay | Admitting: *Deleted

## 2015-09-24 NOTE — Telephone Encounter (Signed)
Per Dr Leonie Man, LVM for patient informing her that her lab results for reversible causes of dementia were normal. Left name, number for questions.

## 2015-09-25 DIAGNOSIS — H9313 Tinnitus, bilateral: Secondary | ICD-10-CM | POA: Diagnosis not present

## 2015-09-25 DIAGNOSIS — H903 Sensorineural hearing loss, bilateral: Secondary | ICD-10-CM | POA: Diagnosis not present

## 2015-09-26 ENCOUNTER — Ambulatory Visit (AMBULATORY_SURGERY_CENTER): Payer: Medicare Other | Admitting: Gastroenterology

## 2015-09-26 ENCOUNTER — Encounter: Payer: Self-pay | Admitting: Gastroenterology

## 2015-09-26 VITALS — BP 148/68 | HR 66 | Temp 98.4°F | Resp 14 | Ht 66.0 in | Wt 212.0 lb

## 2015-09-26 DIAGNOSIS — K2950 Unspecified chronic gastritis without bleeding: Secondary | ICD-10-CM | POA: Diagnosis not present

## 2015-09-26 DIAGNOSIS — I10 Essential (primary) hypertension: Secondary | ICD-10-CM | POA: Diagnosis not present

## 2015-09-26 DIAGNOSIS — R1310 Dysphagia, unspecified: Secondary | ICD-10-CM | POA: Diagnosis not present

## 2015-09-26 DIAGNOSIS — F900 Attention-deficit hyperactivity disorder, predominantly inattentive type: Secondary | ICD-10-CM | POA: Diagnosis not present

## 2015-09-26 DIAGNOSIS — K319 Disease of stomach and duodenum, unspecified: Secondary | ICD-10-CM

## 2015-09-26 DIAGNOSIS — K219 Gastro-esophageal reflux disease without esophagitis: Secondary | ICD-10-CM | POA: Diagnosis not present

## 2015-09-26 MED ORDER — SODIUM CHLORIDE 0.9 % IV SOLN: 500.0000 mL | INTRAVENOUS | Status: DC

## 2015-09-26 NOTE — Op Note (Signed)
[place] Patient Name: Tonya Bartlett Procedure Date: 09/26/2015 9:55 AM MRN: [id number] Endoscopist: Remo Lipps P. Havery Moros , MD Age: 70 Referring MD:  Date of Birth: [birth year] Gender: Female Account #: [id number] Procedure:                Upper GI endoscopy Indications:              Dysphagia, Globus sensation Medicines:                Monitored Anesthesia Care Procedure:                Pre-Anesthesia Assessment:                           - Prior to the procedure, a History and Physical                            was performed, and patient medications and                            allergies were reviewed. The patient's tolerance of                            previous anesthesia was also reviewed. The risks                            and benefits of the procedure and the sedation                            options and risks were discussed with the patient.                            All questions were answered, and informed consent                            was obtained. Prior Anticoagulants: The patient has                            taken no previous anticoagulant or antiplatelet                            agents. ASA Grade Assessment: III - A patient with                            severe systemic disease. After reviewing the risks                            and benefits, the patient was deemed in                            satisfactory condition to undergo the procedure.                           After obtaining informed consent, the endoscope was  passed under direct vision. Throughout the                            procedure, the patient's blood pressure, pulse, and                            oxygen saturations were monitored continuously. The                            Model GIF-[id number]) scope was introduced                            through the mouth, and advanced to the second part                            of duodenum. The  upper GI endoscopy was                            accomplished without difficulty. The patient                            tolerated the procedure well. Scope In: Scope Out: Findings:                 Esophagogastric landmarks were identified: the                            Z-line was found at 39 cm, the gastroesophageal                            junction was found at 39 cm and the upper extent of                            the gastric folds was found at 39 cm from the                            incisors.                           A 4 cm hiatal hernia was present.                           The exam of the esophagus was otherwise normal.                           A guidewire was placed and the scope was withdrawn.                            Dilation was performed in the entire esophagus with                            a Savary dilator with mild resistance at 16 mm and  17 mm with no appreciable mucosal wrent.                           Localized mildly erythematous mucosa without                            bleeding was found in the gastric antrum. Biopsies                            were taken with a cold forceps for Helicobacter                            pylori testing of the antrum and body.                           The exam of the stomach was otherwise normal.                           The duodenal bulb and second portion of the                            duodenum were normal. Complications:            No immediate complications. Estimated blood loss:                            Minimal. Estimated Blood Loss:     Estimated blood loss was minimal. Impression:               - Esophagogastric landmarks identified.                           - 4 cm hiatal hernia.                           - Normal esophagus otherwise without explanation                            for the patient's dysphagia. Dilation performed to                            76mm Savory.                            - Erythematous mucosa in the antrum. Biopsied.                           - Normal duodenal bulb and second portion of the                            duodenum.                           - Recommendation:           - Patient has a contact number available for  emergencies. The signs and symptoms of potential                            delayed complications were discussed with the                            patient. Return to normal activities tomorrow.                            Written discharge instructions were provided to the                            patient.                           - Resume previous diet.                           - Continue present medications.                           - Await pathology results. Remo Lipps P. Armbruster, MD 09/26/2015 10:23:37 AM This report has been signed electronically.

## 2015-09-26 NOTE — Progress Notes (Signed)
Report to PACU, RN, vss, BBS= Clear.  

## 2015-09-26 NOTE — Progress Notes (Signed)
Per Dr. Havery Moros, pt may resume her previous diet today.  No dilatation diet needed, even though she was dilated. Maw  No problems noted in the recovery room. maw

## 2015-09-26 NOTE — Patient Instructions (Addendum)

## 2015-09-26 NOTE — Progress Notes (Signed)
Called to room to assist during endoscopic procedure.  Patient ID and intended procedure confirmed with present staff. Received instructions for my participation in the procedure from the performing physician.  

## 2015-09-27 ENCOUNTER — Other Ambulatory Visit: Payer: Self-pay | Admitting: Adult Health

## 2015-09-27 ENCOUNTER — Telehealth: Payer: Self-pay

## 2015-09-27 ENCOUNTER — Telehealth: Payer: Self-pay | Admitting: Adult Health

## 2015-09-27 MED ORDER — SUMATRIPTAN SUCCINATE 100 MG PO TABS: 100.0000 mg | ORAL_TABLET | ORAL | 6 refills | Status: DC | PRN

## 2015-09-27 NOTE — Telephone Encounter (Signed)
  Follow up Call-  Call back number 09/26/2015  Post procedure Call Back phone  # [phone]  Permission to leave phone message Yes  Some recent data might be hidden     Patient questions:  Do you have a fever, pain , or abdominal swelling? No. Pain Score  0 *  Have you tolerated food without any problems? Yes.    Have you been able to return to your normal activities? Yes.    Do you have any questions about your discharge instructions: Diet   No. Medications  No. Follow up visit  No.  Do you have questions or concerns about your Care? No.  Actions: * If pain score is 4 or above: No action needed, pain <4.

## 2015-09-27 NOTE — Telephone Encounter (Signed)
° °  Refill req  SUMAtriptan (IMITREX) 100 MG tablet   [address]

## 2015-09-27 NOTE — Telephone Encounter (Signed)
Ok to refill 

## 2015-10-01 ENCOUNTER — Encounter: Payer: Self-pay | Admitting: Gastroenterology

## 2015-10-02 ENCOUNTER — Other Ambulatory Visit: Payer: Self-pay

## 2015-10-07 ENCOUNTER — Other Ambulatory Visit (INDEPENDENT_AMBULATORY_CARE_PROVIDER_SITE_OTHER): Payer: Medicare Other

## 2015-10-07 DIAGNOSIS — Z0000 Encounter for general adult medical examination without abnormal findings: Secondary | ICD-10-CM | POA: Diagnosis not present

## 2015-10-07 LAB — HEPATIC FUNCTION PANEL
ALT: 18 U/L (ref 0–35)
AST: 20 U/L (ref 0–37)
Albumin: 4.3 g/dL (ref 3.5–5.2)
Alkaline Phosphatase: 66 U/L (ref 39–117)
BILIRUBIN TOTAL: 0.5 mg/dL (ref 0.2–1.2)
Bilirubin, Direct: 0.1 mg/dL (ref 0.0–0.3)
Total Protein: 7.1 g/dL (ref 6.0–8.3)

## 2015-10-07 LAB — POC URINALSYSI DIPSTICK (AUTOMATED)
BILIRUBIN UA: NEGATIVE
Blood, UA: NEGATIVE
Glucose, UA: NEGATIVE
KETONES UA: NEGATIVE
Nitrite, UA: NEGATIVE
Protein, UA: NEGATIVE
Spec Grav, UA: 1.015
Urobilinogen, UA: 0.2
pH, UA: 7

## 2015-10-07 LAB — LIPID PANEL
CHOLESTEROL: 240 mg/dL — AB (ref 0–200)
HDL: 51.9 mg/dL (ref >39.00)
LDL CALC: 168 mg/dL — AB (ref 0–99)
NonHDL: 187.61
TRIGLYCERIDES: 100 mg/dL (ref 0.0–149.0)
Total CHOL/HDL Ratio: 5
VLDL: 20 mg/dL (ref 0.0–40.0)

## 2015-10-07 LAB — CBC WITH DIFFERENTIAL/PLATELET
BASOS ABS: 0.1 10*3/uL (ref 0.0–0.1)
BASOS PCT: 2.1 % (ref 0.0–3.0)
EOS ABS: 0.1 10*3/uL (ref 0.0–0.7)
Eosinophils Relative: 2.5 % (ref 0.0–5.0)
HEMATOCRIT: 37.3 % (ref 36.0–46.0)
HEMOGLOBIN: 12.8 g/dL (ref 12.0–15.0)
LYMPHS PCT: 30.9 % (ref 12.0–46.0)
Lymphs Abs: 1.6 10*3/uL (ref 0.7–4.0)
MCHC: 34.3 g/dL (ref 30.0–36.0)
MCV: 96 fl (ref 78.0–100.0)
Monocytes Absolute: 0.4 10*3/uL (ref 0.1–1.0)
Monocytes Relative: 7.4 % (ref 3.0–12.0)
NEUTROS PCT: 57.1 % (ref 43.0–77.0)
Neutro Abs: 2.9 10*3/uL (ref 1.4–7.7)
Platelets: 322 10*3/uL (ref 150.0–400.0)
RBC: 3.88 Mil/uL (ref 3.87–5.11)
RDW: 12.9 % (ref 11.5–15.5)
WBC: 5.1 10*3/uL (ref 4.0–10.5)

## 2015-10-07 LAB — BASIC METABOLIC PANEL
BUN: 7 mg/dL (ref 6–23)
CHLORIDE: 104 meq/L (ref 96–112)
CO2: 28 meq/L (ref 19–32)
CREATININE: 0.59 mg/dL (ref 0.40–1.20)
Calcium: 9 mg/dL (ref 8.4–10.5)
GFR: 106.77 mL/min (ref >60.00)
Glucose, Bld: 103 mg/dL — ABNORMAL HIGH (ref 70–99)
Potassium: 3.8 mEq/L (ref 3.5–5.1)
Sodium: 138 mEq/L (ref 135–145)

## 2015-10-07 LAB — TSH: TSH: 1.9 u[IU]/mL (ref 0.35–4.50)

## 2015-10-09 ENCOUNTER — Ambulatory Visit
Admission: RE | Admit: 2015-10-09 | Discharge: 2015-10-09 | Disposition: A | Discharge status: Home / Self Care | Payer: Medicare Other | Source: Ambulatory Visit | Attending: Neurology | Admitting: Neurology

## 2015-10-09 DIAGNOSIS — R413 Other amnesia: Secondary | ICD-10-CM

## 2015-10-09 DIAGNOSIS — G3184 Mild cognitive impairment, so stated: Secondary | ICD-10-CM

## 2015-10-09 MED ORDER — GADOBENATE DIMEGLUMINE 529 MG/ML IV SOLN: 19.0000 mL | Freq: Once | INTRAVENOUS | Status: DC | PRN

## 2015-10-11 ENCOUNTER — Ambulatory Visit (INDEPENDENT_AMBULATORY_CARE_PROVIDER_SITE_OTHER): Payer: Medicare Other | Admitting: Adult Health

## 2015-10-11 ENCOUNTER — Encounter: Payer: Self-pay | Admitting: Adult Health

## 2015-10-11 VITALS — BP 150/80 | Temp 98.6°F | Ht 66.0 in | Wt 207.6 lb

## 2015-10-11 DIAGNOSIS — I10 Essential (primary) hypertension: Secondary | ICD-10-CM | POA: Diagnosis not present

## 2015-10-11 DIAGNOSIS — E785 Hyperlipidemia, unspecified: Secondary | ICD-10-CM | POA: Diagnosis not present

## 2015-10-11 DIAGNOSIS — Z0000 Encounter for general adult medical examination without abnormal findings: Secondary | ICD-10-CM | POA: Diagnosis not present

## 2015-10-11 DIAGNOSIS — Z1211 Encounter for screening for malignant neoplasm of colon: Secondary | ICD-10-CM

## 2015-10-11 MED ORDER — LISINOPRIL 20 MG PO TABS: 20.0000 mg | ORAL_TABLET | Freq: Every morning | ORAL | 3 refills | Status: DC

## 2015-10-11 NOTE — Patient Instructions (Addendum)
It was great seeing you again!  Everything looks good on your blood work except for total cholesterol and LDL (bad) cholesterol. Changes in diet and exercise will help lower these  I have gone up on your lisinopril to 20 mg. We will follow up in one month to see how your blood pressures have been - monitor at home.   I would also like for you to sign up for an annual wellness visit on a Friday with our nurse Manuela Schwartz. This is a free benefit under medicare that may help [name] find additional ways to help you.    Health Maintenance, Female Adopting a healthy lifestyle and getting preventive care can go a long way to promote health and wellness. Talk with your health care provider about what schedule of regular examinations is right for you. This is a good chance for you to check in with your provider about disease prevention and staying healthy. In between checkups, there are plenty of things you can do on your own. Experts have done a lot of research about which lifestyle changes and preventive measures are most likely to keep you healthy. Ask your health care provider for more information. WEIGHT AND DIET  Eat a healthy diet  Be sure to include plenty of vegetables, fruits, low-fat dairy products, and lean protein.  Do not eat a lot of foods high in solid fats, added sugars, or salt.  Get regular exercise. This is one of the most important things you can do for your health.  Most adults should exercise for at least 150 minutes each week. The exercise should increase your heart rate and make you sweat (moderate-intensity exercise).  Most adults should also do strengthening exercises at least twice a week. This is in addition to the moderate-intensity exercise.  Maintain a healthy weight  Body mass index (BMI) is a measurement that can be used to identify possible weight problems. It estimates body fat based on height and weight. Your health care provider can help determine your BMI and help you  achieve or maintain a healthy weight.  For females 36 years of age and older:   A BMI below 18.5 is considered underweight.  A BMI of 18.5 to 24.9 is normal.  A BMI of 25 to 29.9 is considered overweight.  A BMI of 30 and above is considered obese.  Watch levels of cholesterol and blood lipids  You should start having your blood tested for lipids and cholesterol at 71 years of age, then have this test every 5 years.  You may need to have your cholesterol levels checked more often if:  Your lipid or cholesterol levels are high.  You are older than 71 years of age.  You are at high risk for heart disease.  CANCER SCREENING   Lung Cancer  Lung cancer screening is recommended for adults 85-57 years old who are at high risk for lung cancer because of a history of smoking.  A yearly low-dose CT scan of the lungs is recommended for people who:  Currently smoke.  Have quit within the past 15 years.  Have at least a 30-pack-year history of smoking. A pack year is smoking an average of one pack of cigarettes a day for 1 year.  Yearly screening should continue until it has been 15 years since you quit.  Yearly screening should stop if you develop a health problem that would prevent you from having lung cancer treatment.  Breast Cancer  Practice breast self-awareness. This means  understanding how your breasts normally appear and feel.  It also means doing regular breast self-exams. Let your health care provider know about any changes, no matter how small.  If you are in your 20s or 30s, you should have a clinical breast exam (CBE) by a health care provider every 1-3 years as part of a regular health exam.  If you are 50 or older, have a CBE every year. Also consider having a breast X-ray (mammogram) every year.  If you have a family history of breast cancer, talk to your health care provider about genetic screening.  If you are at high risk for breast cancer, talk to your  health care provider about having an MRI and a mammogram every year.  Breast cancer gene (BRCA) assessment is recommended for women who have family members with BRCA-related cancers. BRCA-related cancers include:  Breast.  Ovarian.  Tubal.  Peritoneal cancers.  Results of the assessment will determine the need for genetic counseling and BRCA1 and BRCA2 testing. Cervical Cancer Your health care provider may recommend that you be screened regularly for cancer of the pelvic organs (ovaries, uterus, and vagina). This screening involves a pelvic examination, including checking for microscopic changes to the surface of your cervix (Pap test). You may be encouraged to have this screening done every 3 years, beginning at age 84.  For women ages 22-65, health care providers may recommend pelvic exams and Pap testing every 3 years, or they may recommend the Pap and pelvic exam, combined with testing for human papilloma virus (HPV), every 5 years. Some types of HPV increase your risk of cervical cancer. Testing for HPV may also be done on women of any age with unclear Pap test results.  Other health care providers may not recommend any screening for nonpregnant women who are considered low risk for pelvic cancer and who do not have symptoms. Ask your health care provider if a screening pelvic exam is right for you.  If you have had past treatment for cervical cancer or a condition that could lead to cancer, you need Pap tests and screening for cancer for at least 20 years after your treatment. If Pap tests have been discontinued, your risk factors (such as having a new sexual partner) need to be reassessed to determine if screening should resume. Some women have medical problems that increase the chance of getting cervical cancer. In these cases, your health care provider may recommend more frequent screening and Pap tests. Colorectal Cancer  This type of cancer can be detected and often  prevented.  Routine colorectal cancer screening usually begins at 71 years of age and continues through 71 years of age.  Your health care provider may recommend screening at an earlier age if you have risk factors for colon cancer.  Your health care provider may also recommend using home test kits to check for hidden blood in the stool.  A small camera at the end of a tube can be used to examine your colon directly (sigmoidoscopy or colonoscopy). This is done to check for the earliest forms of colorectal cancer.  Routine screening usually begins at age 3.  Direct examination of the colon should be repeated every 5-10 years through 71 years of age. However, you may need to be screened more often if early forms of precancerous polyps or small growths are found. Skin Cancer  Check your skin from head to toe regularly.  Tell your health care provider about any new moles or changes in  moles, especially if there is a change in a mole's shape or color.  Also tell your health care provider if you have a mole that is larger than the size of a pencil eraser.  Always use sunscreen. Apply sunscreen liberally and repeatedly throughout the day.  Protect yourself by wearing long sleeves, pants, a wide-brimmed hat, and sunglasses whenever you are outside. HEART DISEASE, DIABETES, AND HIGH BLOOD PRESSURE   High blood pressure causes heart disease and increases the risk of stroke. High blood pressure is more likely to develop in:  People who have blood pressure in the high end of the normal range (130-139/85-89 mm Hg).  People who are overweight or obese.  People who are African American.  If you are 43-41 years of age, have your blood pressure checked every 3-5 years. If you are 35 years of age or older, have your blood pressure checked every year. You should have your blood pressure measured twice--once when you are at a hospital or clinic, and once when you are not at a hospital or clinic.  Record the average of the two measurements. To check your blood pressure when you are not at a hospital or clinic, you can use:  An automated blood pressure machine at a pharmacy.  A home blood pressure monitor.  If you are between 64 years and 4 years old, ask your health care provider if you should take aspirin to prevent strokes.  Have regular diabetes screenings. This involves taking a blood sample to check your fasting blood sugar level.  If you are at a normal weight and have a low risk for diabetes, have this test once every three years after 71 years of age.  If you are overweight and have a high risk for diabetes, consider being tested at a younger age or more often. PREVENTING INFECTION  Hepatitis B  If you have a higher risk for hepatitis B, you should be screened for this virus. You are considered at high risk for hepatitis B if:  You were born in a country where hepatitis B is common. Ask your health care provider which countries are considered high risk.  Your parents were born in a high-risk country, and you have not been immunized against hepatitis B (hepatitis B vaccine).  You have HIV or AIDS.  You use needles to inject street drugs.  You live with someone who has hepatitis B.  You have had sex with someone who has hepatitis B.  You get hemodialysis treatment.  You take certain medicines for conditions, including cancer, organ transplantation, and autoimmune conditions. Hepatitis C  Blood testing is recommended for:  Everyone born from [birth year] through 1966.  Anyone with known risk factors for hepatitis C. Sexually transmitted infections (STIs)  You should be screened for sexually transmitted infections (STIs) including gonorrhea and chlamydia if:  You are sexually active and are younger than 71 years of age.  You are older than 72 years of age and your health care provider tells you that you are at risk for this type of infection.  Your sexual activity  has changed since you were last screened and you are at an increased risk for chlamydia or gonorrhea. Ask your health care provider if you are at risk.  If you do not have HIV, but are at risk, it may be recommended that you take a prescription medicine daily to prevent HIV infection. This is called pre-exposure prophylaxis (PrEP). You are considered at risk if:  You are sexually  active and do not regularly use condoms or know the HIV status of your partner(s).  You take drugs by injection.  You are sexually active with a partner who has HIV. Talk with your health care provider about whether you are at high risk of being infected with HIV. If you choose to begin PrEP, you should first be tested for HIV. You should then be tested every 3 months for as long as you are taking PrEP.  PREGNANCY   If you are premenopausal and you may become pregnant, ask your health care provider about preconception counseling.  If you may become pregnant, take 400 to 800 micrograms (mcg) of folic acid every day.  If you want to prevent pregnancy, talk to your health care provider about birth control (contraception). OSTEOPOROSIS AND MENOPAUSE   Osteoporosis is a disease in which the bones lose minerals and strength with aging. This can result in serious bone fractures. Your risk for osteoporosis can be identified using a bone density scan.  If you are 102 years of age or older, or if you are at risk for osteoporosis and fractures, ask your health care provider if you should be screened.  Ask your health care provider whether you should take a calcium or vitamin D supplement to lower your risk for osteoporosis.  Menopause may have certain physical symptoms and risks.  Hormone replacement therapy may reduce some of these symptoms and risks. Talk to your health care provider about whether hormone replacement therapy is right for you.  HOME CARE INSTRUCTIONS   Schedule regular health, dental, and eye  exams.  Stay current with your immunizations.   Do not use any tobacco products including cigarettes, chewing tobacco, or electronic cigarettes.  If you are pregnant, do not drink alcohol.  If you are breastfeeding, limit how much and how often you drink alcohol.  Limit alcohol intake to no more than 1 drink per day for nonpregnant women. One drink equals 12 ounces of beer, 5 ounces of wine, or 1 ounces of hard liquor.  Do not use street drugs.  Do not share needles.  Ask your health care provider for help if you need support or information about quitting drugs.  Tell your health care provider if you often feel depressed.  Tell your health care provider if you have ever been abused or do not feel safe at home.   This information is not intended to replace advice given to you by your health care provider. Make sure you discuss any questions you have with your health care provider.   Document Released: 08/18/2010 Document Revised: 02/23/2014 Document Reviewed: 01/04/2013 [employer]vier Interactive Patient Education [employer].

## 2015-10-11 NOTE — Progress Notes (Signed)
Patient presents to clinic today to establish care. She is a pleasant 70 year old female who  has a past medical history of Allergic rhinitis; Diverticulosis; Frequency of urination; History of DVT of lower extremity; History of iron deficiency anemia; History of migraine headaches; Hyperlipidemia; Migraine headache; OA (osteoarthritis); Recovering alcoholic ([place]); Right rotator cuff tear; and Unspecified essential hypertension.   Acute Concerns: Complete Physical   Chronic Issues: Hypertension  - She reports higher blood pressure readings lately. She denies any signs or symptoms of high blood pressure.   Mild Cognitive impairment - She recently saw Dr. Leonie Man with [facility] for issues with misplacing items throughout the house, having word finding issues, getting lost while driving and forgetting peoples names.   MRI of brain showed:  Mildly abnormal MRI brain (with and without) demonstrating: 1. 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  2. No acute findings.  She was started on Cerefolin NAC 1 tablet daily and fish oil. She has an upcoming appointment for an EEG.    Health Maintenance: Dental -- Routine  Vision -- Routine  Immunizations -- UTD  Colonoscopy -- Is Due  Mammogram --01/2015      Past Medical History:  Diagnosis Date  . Allergic rhinitis   . Diverticulosis   . Frequency of urination   . 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 History of iron deficiency anemia   . History of migraine headaches   . Hyperlipidemia   . Migraine headache   . OA (osteoarthritis)    RIGHT SHOULDER  . Recovering alcoholic ([place])    SINCE [id number]  . Right rotator cuff tear   . Unspecified essential hypertension     Past Surgical History:  Procedure Laterality Date  . BUNIONECTOMY/  HAMMERTOE CORRECTION  RIGHT FOOT  2011  . CATARACT EXTRACTION W/ INTRAOCULAR LENS  IMPLANT,  BILATERAL    . KNEE ARTHROSCOPY W/ MENISCECTOMY Bilateral X2  LEFT /    X1  RIGHT  . REPLACEMENT TOTAL KNEE Left 2006  . SHOULDER ARTHROSCOPY WITH SUBACROMIAL DECOMPRESSION, ROTATOR CUFF REPAIR AND BICEP TENDON REPAIR Right 05/23/2013   Procedure: RIGHT SHOULDER ARTHROSCOPY EXAM UNDER ANESTHESIA  WITH SUBACROMIAL DECOMPRESSION,DISTAL CLAVICLE RESECTION, SADLABRAL DEBRIDEMENT CHONDROPLASTY, BICEP TENOTOMY ;  Surgeon: Sydnee Cabal, MD;  Location: [facility];  Service: Orthopedics;  Laterality: Right;  . TONSILLECTOMY AND ADENOIDECTOMY  AGE 59  . TOTAL KNEE ARTHROPLASTY Bilateral LEFT  1996/   RIGHT 2004  . VAGINAL HYSTERECTOMY  1976    Current Outpatient Prescriptions on File Prior to Visit  Medication Sig Dispense Refill  . betamethasone dipropionate 0.05 % lotion Apply topically 2 (two) times daily. 60 mL 4  . calcium citrate-vitamin D (CITRACAL+D) 315-200 MG-UNIT tablet Take 1 tablet by mouth 2 (two) times daily.    . citalopram (CELEXA) 40 MG tablet Take 1 tablet (40 mg total) by mouth daily. 90 tablet 3  . ibuprofen (ADVIL,MOTRIN) 200 MG tablet Take 200 mg by mouth every 6 (six) hours as needed.    Marland Kitchen lisinopril (PRINIVIL,ZESTRIL) 10 MG tablet Take 1 tablet (10 mg total) by mouth every morning. 90 tablet 3  . Methylfol-Algae-B12-Acetylcyst (CEREFOLIN NAC) 6-90.314-2-600 MG TABS Take 1 tablet by mouth daily. 90 tablet 3  . Multiple Vitamins-Minerals (MULTIVITAMIN WITH MINERALS) tablet Take 1 tablet by mouth daily. Reported on 06/14/2015    . omeprazole (PRILOSEC) 40 MG capsule Take 1 capsule (  40 mg total) by mouth daily. 30 capsule 3  . ondansetron (ZOFRAN ODT) 4 MG disintegrating tablet Take 1 tablet (4 mg total) by mouth every 8 (eight) hours as needed for nausea or vomiting. 20 tablet 0  . potassium chloride (K-DUR,KLOR-CON) 10 MEQ tablet Take 10 mEq by mouth 2 (two) times daily.    . SUMAtriptan (IMITREX) 100 MG tablet Take 1 tablet (100 mg total) by mouth every 2 (two)  hours as needed for migraine. May repeat in 2 hours if headache persists or recurs. 10 tablet 6   Current Facility-Administered Medications on File Prior to Visit  Medication Dose Route Frequency Provider Last Rate Last Dose  . 0.9 %  sodium chloride infusion  500 mL Intravenous Continuous Manus Gunning, MD      . 0.9 %  sodium chloride infusion  500 mL Intravenous Continuous Manus Gunning, MD        Allergies  Allergen Reactions  . Penicillins Anaphylaxis  . Erythromycin Other (See Comments)    SEVERE STOMACH CRAMPS  . Morphine And Related Nausea And Vomiting  . Nsaids Other (See Comments)    SEVERE STOMACH CRAMPS  . Nickel Rash    Family History  Problem Relation Age of Onset  . Heart disease Father 76  . Hypertension Father   . Cancer Sister     skin CA, 2 sisters  . Esophageal cancer Brother   . Dementia Paternal Grandfather     Social History   Social History  . Marital status: Widowed    Spouse name: N/A  . Number of children: N/A  . Years of education: N/A   Occupational History  . Not on file.   Social History Main Topics  . Smoking status: Former Smoker    Packs/day: 0.50    Years: 15.00    Types: Cigarettes    Quit date: 05/15/1985  . Smokeless tobacco: Never Used  . Alcohol use No     Comment: RECOVERING ALCOHOLIC--  QUIT [id number]  . Drug use: No  . Sexual activity: No   Other Topics Concern  . Not on file   Social History Narrative  . No narrative on file    Review of Systems  Constitutional: Negative.   HENT: Negative.   Eyes: Negative.   Respiratory: Negative.   Cardiovascular: Negative.   Gastrointestinal: Negative.   Genitourinary: Negative.   Musculoskeletal: Negative.   Skin: Negative.   Neurological: Negative.   Endo/Heme/Allergies: Negative.   Psychiatric/Behavioral: Negative.   All other systems reviewed and are negative.   BP (!) 150/80   Temp 98.6 F (37 C) (Oral)   Ht '5\' 6"'  (1.676 m)   Wt 207 lb  9.6 oz (94.2 kg)   BMI 33.51 kg/m   Physical Exam  Constitutional: She is oriented to person, place, and time and well-developed, well-nourished, and in no distress. No distress.  HENT:  Head: Normocephalic and atraumatic.  Right Ear: External ear normal.  Left Ear: External ear normal.  Nose: Nose normal.  Mouth/Throat: Oropharynx is clear and moist. No oropharyngeal exudate.  Eyes: Conjunctivae and EOM are normal. Pupils are equal, round, and reactive to light. Right eye exhibits no discharge. Left eye exhibits no discharge. No scleral icterus.  Neck: Normal range of motion. Neck supple. No JVD present. Carotid bruit is not present. No tracheal deviation present. No thyromegaly present.  Cardiovascular: Normal rate, regular rhythm, normal heart sounds and intact distal pulses.  Exam reveals no gallop and no  friction rub.   No murmur heard. Pulmonary/Chest: Effort normal and breath sounds normal. No stridor. No respiratory distress. She has no wheezes. She has no rales. She exhibits no tenderness.  Abdominal: Soft. Bowel sounds are normal. She exhibits no distension and no mass. There is no tenderness. There is no rebound and no guarding.  Genitourinary:  Genitourinary Comments: Breast Exam: No masses or lumps. She does not have any discharge or dimpling  Musculoskeletal: Normal range of motion. She exhibits no edema, tenderness or deformity.  Lymphadenopathy:    She has no cervical adenopathy.  Neurological: She is alert and oriented to person, place, and time. She has normal reflexes. She displays normal reflexes. No cranial nerve deficit. She exhibits normal muscle tone. Gait normal. Coordination normal. GCS score is 15.  Skin: Skin is warm and dry. No rash noted. She is not diaphoretic. No erythema. No pallor.  Scattered keratosis throughout body   Psychiatric: Mood, memory, affect and judgment normal.  Nursing note and vitals reviewed.   Recent Results (from the past 2160 hour(s))   Vitamin B12     Status: None   Collection Time: 08/30/15  3:31 PM  Result Value Ref Range   Vitamin B-12 493 200 - 1,100 pg/mL  Basic metabolic panel     Status: Abnormal   Collection Time: 08/30/15  3:31 PM  Result Value Ref Range   Sodium 134 (L) 135 - 146 mmol/L   Potassium 4.0 3.5 - 5.3 mmol/L   Chloride 100 98 - 110 mmol/L   CO2 24 20 - 31 mmol/L   Glucose, Bld 90 65 - 99 mg/dL   BUN 14 7 - 25 mg/dL   Creat 0.68 0.60 - 0.93 mg/dL    Comment:   For patients > or = 71 years of age: The upper reference limit for Creatinine is approximately 13% higher for people identified as African-American.      Calcium 9.1 8.6 - 10.4 mg/dL  CBC with Differential/Platelet     Status: None   Collection Time: 08/30/15  3:31 PM  Result Value Ref Range   WBC 7.7 3.8 - 10.8 K/uL   RBC 3.87 3.80 - 5.10 MIL/uL   Hemoglobin 12.3 11.7 - 15.5 g/dL   HCT 37.2 35.0 - 45.0 %   MCV 96.1 80.0 - 100.0 fL   MCH 31.8 27.0 - 33.0 pg   MCHC 33.1 32.0 - 36.0 g/dL   RDW 13.3 11.0 - 15.0 %   Platelets 354 140 - 400 K/uL   MPV 10.0 7.5 - 12.5 fL   Neutro Abs 5,390 1,500 - 7,800 cells/uL   Lymphs Abs 1,617 850 - 3,900 cells/uL   Monocytes Absolute 539 200 - 950 cells/uL   Eosinophils Absolute 77 15 - 500 cells/uL   Basophils Absolute 77 0 - 200 cells/uL   Neutrophils Relative % 70 %   Lymphocytes Relative 21 %   Monocytes Relative 7 %   Eosinophils Relative 1 %   Basophils Relative 1 %   Smear Review Criteria for review not met     Comment: ** Please note change in unit of measure and reference range(s). **  TSH     Status: None   Collection Time: 08/30/15  3:31 PM  Result Value Ref Range   TSH 1.40 mIU/L    Comment:   Reference Range   > or = 20 Years  0.40-4.50   Pregnancy Range First trimester  0.26-2.66 Second trimester 0.55-2.73 Third trimester  0.43-2.91  Dementia Panel     Status: None   Collection Time: 09/19/15 10:31 AM  Result Value Ref Range   Vitamin B-12 380 211 - 946  pg/mL   Homocysteine 10.7 0.0 - 15.0 umol/L   TSH 1.940 0.450 - 4.500 uIU/mL   RPR Ser Ql Non Reactive Non Reactive  CBC with Differential/Platelet     Status: None   Collection Time: 10/07/15  8:31 AM  Result Value Ref Range   WBC 5.1 4.0 - 10.5 K/uL   RBC 3.88 3.87 - 5.11 Mil/uL   Hemoglobin 12.8 12.0 - 15.0 g/dL   HCT 37.3 36.0 - 46.0 %   MCV 96.0 78.0 - 100.0 fl   MCHC 34.3 30.0 - 36.0 g/dL   RDW 12.9 11.5 - 15.5 %   Platelets 322.0 150.0 - 400.0 K/uL   Neutrophils Relative % 57.1 43.0 - 77.0 %   Lymphocytes Relative 30.9 12.0 - 46.0 %   Monocytes Relative 7.4 3.0 - 12.0 %   Eosinophils Relative 2.5 0.0 - 5.0 %   Basophils Relative 2.1 0.0 - 3.0 %   Neutro Abs 2.9 1.4 - 7.7 K/uL   Lymphs Abs 1.6 0.7 - 4.0 K/uL   Monocytes Absolute 0.4 0.1 - 1.0 K/uL   Eosinophils Absolute 0.1 0.0 - 0.7 K/uL   Basophils Absolute 0.1 0.0 - 0.1 K/uL  Hepatic function panel     Status: None   Collection Time: 10/07/15  8:31 AM  Result Value Ref Range   Total Bilirubin 0.5 0.2 - 1.2 mg/dL   Bilirubin, Direct 0.1 0.0 - 0.3 mg/dL   Alkaline Phosphatase 66 39 - 117 U/L   AST 20 0 - 37 U/L   ALT 18 0 - 35 U/L   Total Protein 7.1 6.0 - 8.3 g/dL   Albumin 4.3 3.5 - 5.2 g/dL  Lipid panel     Status: Abnormal   Collection Time: 10/07/15  8:31 AM  Result Value Ref Range   Cholesterol 240 (H) 0 - 200 mg/dL    Comment: ATP III Classification       Desirable:  < 200 mg/dL               Borderline High:  200 - 239 mg/dL          High:  > = 240 mg/dL   Triglycerides 100.0 0.0 - 149.0 mg/dL    Comment: Normal:  <150 mg/dLBorderline High:  150 - 199 mg/dL   HDL 51.90 >39.00 mg/dL   VLDL 20.0 0.0 - 40.0 mg/dL   LDL Cholesterol 168 (H) 0 - 99 mg/dL   Total CHOL/HDL Ratio 5     Comment:                Men          Women1/2 Average Risk     3.4          3.3Average Risk          5.0          4.42X Average Risk          9.6          7.13X Average Risk          15.0          11.0                       NonHDL  187.61     Comment: NOTE:  Non-HDL goal  should be 30 mg/dL higher than patient's LDL goal (i.e. LDL goal of < 70 mg/dL, would have non-HDL goal of < 100 mg/dL)  TSH     Status: None   Collection Time: 10/07/15  8:31 AM  Result Value Ref Range   TSH 1.90 0.35 - 4.50 uIU/mL  Basic metabolic panel     Status: Abnormal   Collection Time: 10/07/15  8:31 AM  Result Value Ref Range   Sodium 138 135 - 145 mEq/L   Potassium 3.8 3.5 - 5.1 mEq/L   Chloride 104 96 - 112 mEq/L   CO2 28 19 - 32 mEq/L   Glucose, Bld 103 (H) 70 - 99 mg/dL   BUN 7 6 - 23 mg/dL   Creatinine, Ser 0.59 0.40 - 1.20 mg/dL   Calcium 9.0 8.4 - 10.5 mg/dL   GFR 106.77 >60.00 mL/min  POCT Urinalysis Dipstick (Automated)     Status: Abnormal   Collection Time: 10/07/15 10:13 AM  Result Value Ref Range   Color, UA yellow    Clarity, UA clear    Glucose, UA n    Bilirubin, UA n    Ketones, UA n    Spec Grav, UA 1.015    Blood, UA n    pH, UA 7.0    Protein, UA n    Urobilinogen, UA 0.2    Nitrite, UA n    Leukocytes, UA Trace (A) Negative    Assessment/Plan: 1. Routine general medical examination at a health care facility - Reviewed labs in detail with patient. All questions answered - Encouraged healthy eating and exercise - Follow up in one year for next CPE  2. Essential hypertension, benign - No controlled. Will increase Lisinopril - lisinopril (PRINIVIL,ZESTRIL) 20 MG tablet; Take 1 tablet (20 mg total) by mouth every morning.  Dispense: 90 tablet; Refill: 3 - Monitor Bp at home and bring results to next visit in one month 3. Hyperlipidemia - 19.8% 10 year CVA risk. It is receommeneded that she go on a statin medication. She does not want to do this currently. She knows her risks, including MI, CVA and possibly death - Encouraged healthy eating and exercise - EKG 12-Lead- Sinus  Rhythm  -  Nonspecific T-abnormality. Rate 79  4. Colon cancer screening  - Ambulatory referral to Gastroenterology  [name], NP

## 2015-10-14 ENCOUNTER — Telehealth: Payer: Self-pay | Admitting: Gastroenterology

## 2015-10-14 ENCOUNTER — Telehealth: Payer: Self-pay | Admitting: Adult Health

## 2015-10-14 DIAGNOSIS — K922 Gastrointestinal hemorrhage, unspecified: Secondary | ICD-10-CM | POA: Diagnosis not present

## 2015-10-14 DIAGNOSIS — I10 Essential (primary) hypertension: Secondary | ICD-10-CM | POA: Diagnosis not present

## 2015-10-14 DIAGNOSIS — K625 Hemorrhage of anus and rectum: Secondary | ICD-10-CM | POA: Diagnosis not present

## 2015-10-14 DIAGNOSIS — F339 Major depressive disorder, recurrent, unspecified: Secondary | ICD-10-CM | POA: Diagnosis not present

## 2015-10-14 DIAGNOSIS — K5730 Diverticulosis of large intestine without perforation or abscess without bleeding: Secondary | ICD-10-CM | POA: Diagnosis not present

## 2015-10-14 DIAGNOSIS — K529 Noninfective gastroenteritis and colitis, unspecified: Secondary | ICD-10-CM | POA: Diagnosis not present

## 2015-10-14 NOTE — Telephone Encounter (Signed)
Reviewed the results of the pathology with her.  She did not get her letter in the mail.  I mailed her another copy.   She reports that she is on the way to the ED. She reports diarrhea that started last night, but now she is having rectal bleeding.  She reports multiple episodes of blood independent of stool with clots.  She has her sister driving.  She is advised that she should proceed to the ED and that she should call back for a follow up if not kept.

## 2015-10-14 NOTE — Telephone Encounter (Signed)
Patient Name: Tonya Bartlett DOB: 1945/05/10 Initial Comment had bad IBS attack last night, small passing clots, some blood, bad cramps Nurse Assessment Nurse: Vallery Sa, RN, [place] Date/Time (Eastern Time): 10/14/2015 9:00:06 AM Confirm and document reason for call. If symptomatic, describe symptoms. You must click the next button to save text entered. ---Caller states she has IBS and she developed blood in her stool last night and she continues to have rectal bleeding with abdominal cramping (rated as a 3/4 on the 1 to 10 scale). No fever. No injury in the past 3 days. Alert and responsive. Has the patient traveled out of the country within the last 30 days? ---No Does the patient have any new or worsening symptoms? ---Yes Will a triage be completed? ---Yes Related visit to physician within the last 2 weeks? ---No Does the PT have any chronic conditions? (i.e. diabetes, asthma, etc.) ---Yes List chronic conditions. ---IBS, Arthritis, Knee replacements Is this a behavioral health or substance abuse call? ---No Guidelines Guideline Title Affirmed Question Affirmed Notes Rectal Bleeding SEVERE rectal bleeding (large blood clots; on and off, or constant bleeding) Final Disposition User Go to ED Now Vallery Sa, RN, [place] Comments She is out of town and she will go to her closest ER. Referrals GO TO FACILITY OTHER - SPECIFY Disagree/Comply: Comply

## 2015-10-14 NOTE — Telephone Encounter (Signed)
Thanks for taking her call and sending path results. If she is having multiple episodes of rectal bleeding / clots without stool agree with evaluation in the ER to evaluate for lower GI bleeding

## 2015-10-14 NOTE — Telephone Encounter (Signed)
Please Advise  Called pt to follow up with triage call. Pt states that she in at the ED at this moment.

## 2015-10-15 DIAGNOSIS — K529 Noninfective gastroenteritis and colitis, unspecified: Secondary | ICD-10-CM | POA: Diagnosis not present

## 2015-10-15 DIAGNOSIS — A09 Infectious gastroenteritis and colitis, unspecified: Secondary | ICD-10-CM | POA: Diagnosis not present

## 2015-10-15 DIAGNOSIS — D649 Anemia, unspecified: Secondary | ICD-10-CM | POA: Diagnosis not present

## 2015-10-15 DIAGNOSIS — K921 Melena: Secondary | ICD-10-CM | POA: Diagnosis not present

## 2015-10-16 DIAGNOSIS — K921 Melena: Secondary | ICD-10-CM | POA: Diagnosis not present

## 2015-10-16 DIAGNOSIS — K529 Noninfective gastroenteritis and colitis, unspecified: Secondary | ICD-10-CM | POA: Diagnosis not present

## 2015-10-16 DIAGNOSIS — K5730 Diverticulosis of large intestine without perforation or abscess without bleeding: Secondary | ICD-10-CM | POA: Diagnosis not present

## 2015-10-16 DIAGNOSIS — K559 Vascular disorder of intestine, unspecified: Secondary | ICD-10-CM | POA: Diagnosis not present

## 2015-10-16 DIAGNOSIS — D649 Anemia, unspecified: Secondary | ICD-10-CM | POA: Diagnosis not present

## 2015-10-16 DIAGNOSIS — A09 Infectious gastroenteritis and colitis, unspecified: Secondary | ICD-10-CM | POA: Diagnosis not present

## 2015-10-16 DIAGNOSIS — K635 Polyp of colon: Secondary | ICD-10-CM | POA: Diagnosis not present

## 2015-10-18 ENCOUNTER — Other Ambulatory Visit: Payer: Self-pay

## 2015-10-23 ENCOUNTER — Other Ambulatory Visit: Payer: Self-pay

## 2015-10-24 ENCOUNTER — Ambulatory Visit (INDEPENDENT_AMBULATORY_CARE_PROVIDER_SITE_OTHER): Payer: Medicare Other | Admitting: Adult Health

## 2015-10-24 ENCOUNTER — Encounter: Payer: Self-pay | Admitting: Adult Health

## 2015-10-24 ENCOUNTER — Ambulatory Visit: Payer: Medicare Other | Admitting: Adult Health

## 2015-10-24 ENCOUNTER — Ambulatory Visit (INDEPENDENT_AMBULATORY_CARE_PROVIDER_SITE_OTHER): Payer: Medicare Other

## 2015-10-24 VITALS — BP 146/100 | HR 79 | Temp 99.1°F | Resp 20 | Ht 66.0 in | Wt 205.0 lb

## 2015-10-24 DIAGNOSIS — G3184 Mild cognitive impairment, so stated: Secondary | ICD-10-CM

## 2015-10-24 DIAGNOSIS — R410 Disorientation, unspecified: Secondary | ICD-10-CM

## 2015-10-24 DIAGNOSIS — K529 Noninfective gastroenteritis and colitis, unspecified: Secondary | ICD-10-CM

## 2015-10-24 DIAGNOSIS — R413 Other amnesia: Secondary | ICD-10-CM

## 2015-10-24 NOTE — Progress Notes (Signed)
Pre visit review using our clinic review tool, if applicable. No additional management support is needed unless otherwise documented below in the visit note. 

## 2015-10-24 NOTE — Progress Notes (Signed)
   Subjective:    Patient ID: Tonya Bartlett, female    DOB: [birth year], 70 y.o.   MRN: [id number]  HPI  70 year old female who presents to the office today for hospital follow up. She was seen at [facility] in [place], [place] on 10/14/2015 and admitted over night for GI bleeding and colitis. The patient reports that the night before she was seen she had developed nausea with abdominal pain associated with multiple bowel movements and rectal bleeding. She has a history of IBS and her last colonoscopy was about 9 years ago.   She had a CT of the abdomen and pelvis with contrast that showed acute colitis involving the descending colon.   While in the hospital she had a colonoscopy which showed:  There was normal mucosa in the sigmoid colon from 40 to 20 cm approximately there was some focal erythremia noted with edema. There were no erosions or ulcerations. Multiple polyps were found throughout the colon. There were several small diminutive polyps, but there were also 2 small sessile polyps found in the sigmoid colon These were not removed to avoid confusion with possible polypectomy bleeding. Several diverticula noted in the sigmoid colon associated with thickened folds. She did have moderate amount of internal hemorrhoids that were not bleeding.   She was advised to follow up with GI in [place] for repeat colonoscopy to have polyps removed.   She was given IV Cipro and Flagyl as well as 5 days of each, which she finished yesterday  Today in the office she reports feeling " much better". Sh continues to have loose stools but denies any rectal bleeding. She is no longer having any abdominal pain   Five days of Cipro and Flagyl which she finished yesterday   Review of Systems  Constitutional: Negative.   Respiratory: Negative.   Cardiovascular: Negative.   Gastrointestinal: Positive for abdominal pain (resolved), blood in stool (resolved) and diarrhea. Negative for  abdominal distention, anal bleeding, constipation, nausea, rectal pain and vomiting.  Genitourinary: Negative.   Neurological: Negative.   Psychiatric/Behavioral: Negative.   All other systems reviewed and are negative.      Objective:   Physical Exam  Constitutional: She is oriented to person, place, and time. She appears well-developed and well-nourished. No distress.  Cardiovascular: Normal rate, regular rhythm, normal heart sounds and intact distal pulses.  Exam reveals no gallop.   No murmur heard. Pulmonary/Chest: Effort normal and breath sounds normal. No respiratory distress. She has no wheezes. She has no rales. She exhibits no tenderness.  Abdominal: Soft. Bowel sounds are normal. She exhibits no distension and no mass. There is no tenderness. There is no rebound and no guarding.  Neurological: She is alert and oriented to person, place, and time.  Skin: Skin is warm and dry. No rash noted. She is not diaphoretic. No erythema. No pallor.  Psychiatric: She has a normal mood and affect. Her behavior is normal. Judgment and thought content normal.  Nursing note and vitals reviewed.     Assessment & Plan:  1. Colitis - She seems to be doing much better. Will refer to GI for colonoscopy and to establish care.  - Continue with diet and advance as tolerated - Follow up with any issues - records scanned into chart - Ambulatory referral to Gastroenterology  [name], NP

## 2015-10-25 ENCOUNTER — Telehealth: Payer: Self-pay | Admitting: Adult Health

## 2015-10-25 NOTE — Telephone Encounter (Signed)
Pt was offerred cough med yesterday and decline. Pt would like cough med now [facility]

## 2015-10-28 ENCOUNTER — Other Ambulatory Visit: Payer: Self-pay

## 2015-10-28 MED ORDER — HYDROCODONE-HOMATROPINE 5-1.5 MG/5ML PO SYRP: 5.0000 mL | ORAL_SOLUTION | Freq: Four times a day (QID) | ORAL | 0 refills | Status: DC | PRN

## 2015-10-28 NOTE — Telephone Encounter (Signed)
The cough medicine has hydrocodone in it... Is this ok with [name]?

## 2015-10-28 NOTE — Telephone Encounter (Signed)
Please advise 

## 2015-10-28 NOTE — Telephone Encounter (Signed)
Spoke with patient. Patient was OK with medication having hydrocodone in it - Tonya Bartlett gave verbal to send in [place]. Rx ready for pick up. Patient notified.

## 2015-10-28 NOTE — Telephone Encounter (Signed)
Attempted to contact patient - but phone line kept ringing. Will try again later.

## 2015-11-07 ENCOUNTER — Telehealth: Payer: Self-pay

## 2015-11-07 NOTE — Telephone Encounter (Signed)
-----   Message from Garvin Fila, MD sent at 11/07/2015  3:29 PM EDT ----- Mitchell Heir inform the patient had EEG study was normal.

## 2015-11-07 NOTE — Telephone Encounter (Signed)
I spoke to patient and she is aware of results.  

## 2015-11-15 ENCOUNTER — Ambulatory Visit (INDEPENDENT_AMBULATORY_CARE_PROVIDER_SITE_OTHER)
Admission: RE | Admit: 2015-11-15 | Discharge: 2015-11-15 | Disposition: A | Discharge status: Home / Self Care | Payer: Medicare Other | Source: Ambulatory Visit | Attending: Adult Health | Admitting: Adult Health

## 2015-11-15 ENCOUNTER — Ambulatory Visit (INDEPENDENT_AMBULATORY_CARE_PROVIDER_SITE_OTHER): Payer: Medicare Other | Admitting: Adult Health

## 2015-11-15 ENCOUNTER — Encounter: Payer: Self-pay | Admitting: Adult Health

## 2015-11-15 VITALS — BP 140/78 | Temp 98.3°F | Ht 66.0 in | Wt 199.3 lb

## 2015-11-15 DIAGNOSIS — Z23 Encounter for immunization: Secondary | ICD-10-CM | POA: Diagnosis not present

## 2015-11-15 DIAGNOSIS — R059 Cough, unspecified: Secondary | ICD-10-CM

## 2015-11-15 DIAGNOSIS — R05 Cough: Secondary | ICD-10-CM | POA: Diagnosis not present

## 2015-11-15 DIAGNOSIS — I10 Essential (primary) hypertension: Secondary | ICD-10-CM | POA: Diagnosis not present

## 2015-11-15 DIAGNOSIS — G479 Sleep disorder, unspecified: Secondary | ICD-10-CM

## 2015-11-15 DIAGNOSIS — R5383 Other fatigue: Secondary | ICD-10-CM | POA: Diagnosis not present

## 2015-11-15 DIAGNOSIS — R634 Abnormal weight loss: Secondary | ICD-10-CM

## 2015-11-15 MED ORDER — LOSARTAN POTASSIUM 50 MG PO TABS: 50.0000 mg | ORAL_TABLET | Freq: Every day | ORAL | 3 refills | Status: DC

## 2015-11-15 MED ORDER — TRAZODONE HCL 50 MG PO TABS: 25.0000 mg | ORAL_TABLET | Freq: Every day | ORAL | 3 refills | Status: DC

## 2015-11-15 NOTE — Progress Notes (Signed)
Subjective:    Patient ID: Tonya Bartlett, female    DOB: Apr 05, 1945, 70 y.o.   MRN: [id number]  HPI  70 year old female who presents to the office today for one month follow up regarding blood pressure. One month ago she was started on Lisinopril 20 mg. She has not been monitoring at home. Her blood pressure is still high today at 140/78. She has reported a dry cough that has been worse since starting lisinopril.   Other acute complaints that she is complaining about today are   1. Weight loss.  - She has been losing weight over the last few months. She is not exercising and is not dieting. She does not know why she is losing weight. Besides feeling as though she has no energy she does not feel acutely ill.  Wt Readings from Last 3 Encounters:  11/15/15 199 lb 4.8 oz (90.4 kg)  10/24/15 205 lb (93 kg)  10/11/15 207 lb 9.6 oz (94.2 kg)   2. Insomnia - This is a chronic issue that she has been dealing with. She has taken trazodone in the past and this worked well for her. She would like to go back on this medication if that is ok.   Review of Systems  Constitutional: Positive for activity change and fatigue.  HENT: Negative.   Respiratory: Positive for cough (dry ).   Cardiovascular: Negative.   Gastrointestinal: Negative.   Skin: Negative.   Neurological: Negative.   Psychiatric/Behavioral: Positive for sleep disturbance. Negative for self-injury and suicidal ideas. The patient is not nervous/anxious.   All other systems reviewed and are negative.  Past Medical History:  Diagnosis Date  . Allergic rhinitis   . Depression   . Diverticulosis   . Frequency of urination   . GERD (gastroesophageal reflux disease)   . 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 History of iron deficiency anemia   . History of migraine headaches   . Hyperlipidemia   . Migraine headache   . OA (osteoarthritis)    RIGHT SHOULDER  . Recovering alcoholic ([place])    SINCE  [id number]  . Right rotator cuff tear   . Unspecified essential hypertension     Social History   Social History  . Marital status: Widowed    Spouse name: N/A  . Number of children: N/A  . Years of education: N/A   Occupational History  . Not on file.   Social History Main Topics  . Smoking status: Former Smoker    Packs/day: 0.50    Years: 15.00    Types: Cigarettes    Quit date: 05/15/1985  . Smokeless tobacco: Never Used  . Alcohol use No     Comment: RECOVERING ALCOHOLIC--  QUIT [id number]  . Drug use: No  . Sexual activity: No   Other Topics Concern  . 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 BUNIONECTOMY/  HAMMERTOE CORRECTION  RIGHT FOOT  2011  . CATARACT EXTRACTION W/ INTRAOCULAR LENS  IMPLANT, BILATERAL    . KNEE ARTHROSCOPY W/ MENISCECTOMY Bilateral X2  LEFT /    X1  RIGHT  . REPLACEMENT TOTAL KNEE Left 2006  .  SHOULDER ARTHROSCOPY WITH SUBACROMIAL DECOMPRESSION, ROTATOR CUFF REPAIR AND BICEP TENDON REPAIR Right 05/23/2013   Procedure: RIGHT SHOULDER ARTHROSCOPY EXAM UNDER ANESTHESIA  WITH SUBACROMIAL DECOMPRESSION,DISTAL CLAVICLE RESECTION, SADLABRAL DEBRIDEMENT CHONDROPLASTY, BICEP TENOTOMY ;  Surgeon: Sydnee Cabal, MD;  Location: [facility];  Service: Orthopedics;  Laterality: Right;  . TONSILLECTOMY AND ADENOIDECTOMY  AGE 28  . TOTAL KNEE ARTHROPLASTY Bilateral LEFT  1996/   RIGHT 2004  . VAGINAL HYSTERECTOMY  1976    Family History  Problem Relation Age of Onset  . Heart disease Father 62  . Hypertension Father   . Melanoma Mother   . Cancer Sister     skin CA, 2 sisters  . Esophageal cancer Brother   . Dementia Paternal Grandfather     Allergies  Allergen Reactions  . Penicillins Anaphylaxis  . Erythromycin Other (See Comments)     SEVERE STOMACH CRAMPS  . Morphine And Related Nausea And Vomiting  . Nsaids Other (See Comments)    SEVERE STOMACH CRAMPS  . Nickel Rash    Current Outpatient Prescriptions on File Prior to Visit  Medication Sig Dispense Refill  . acetaminophen (TYLENOL) 325 MG tablet Take by mouth.    . betamethasone dipropionate 0.05 % lotion Apply topically 2 (two) times daily. 60 mL 4  . calcium citrate-vitamin D (CITRACAL+D) 315-200 MG-UNIT tablet Take 1 tablet by mouth 2 (two) times daily.    . citalopram (CELEXA) 40 MG tablet Take 1 tablet (40 mg total) by mouth daily. 90 tablet 3  . Methylfol-Algae-B12-Acetylcyst (CEREFOLIN NAC) 6-90.314-2-600 MG TABS Take 1 tablet by mouth daily. 90 tablet 3  . omeprazole (PRILOSEC) 40 MG capsule Take 1 capsule (40 mg total) by mouth daily. 30 capsule 3  . ondansetron (ZOFRAN ODT) 4 MG disintegrating tablet Take 1 tablet (4 mg total) by mouth every 8 (eight) hours as needed for nausea or vomiting. 20 tablet 0  . SUMAtriptan (IMITREX) 100 MG tablet Take 1 tablet (100 mg total) by mouth every 2 (two) hours as needed for migraine. May repeat in 2 hours if headache persists or recurs. 10 tablet 6   No current facility-administered medications on file prior to visit.     BP 140/78   Temp 98.3 F (36.8 C) (Oral)   Ht 5\' 6"  (1.676 m)   Wt 199 lb 4.8 oz (90.4 kg)   BMI 32.17 kg/m       Objective:   Physical Exam  Constitutional: She is oriented to person, place, and time. She appears well-developed and well-nourished. No distress.  Neck: Normal range of motion. Neck supple. No thyromegaly present.  Cardiovascular: Normal rate, regular rhythm, normal heart sounds and intact distal pulses.  Exam reveals no gallop.   No murmur heard. Pulmonary/Chest: Effort normal and breath sounds normal. No respiratory distress. She has no wheezes. She has no rales. She exhibits no tenderness.  Lymphadenopathy:    She has no cervical adenopathy.  Neurological: She is alert and  oriented to person, place, and time.  Skin: Skin is warm and dry. No rash noted. She is not diaphoretic. No erythema. No pallor.  Psychiatric: She has a normal mood and affect. Her behavior is normal. Judgment and thought content normal.  Nursing note and vitals reviewed.     Assessment & Plan:  1. Essential hypertension - d/c lisinopril due to dry cough and fatigue  - losartan (COZAAR) 50 MG tablet; Take 1 tablet (50 mg total) by mouth daily.  Dispense: 30 tablet; Refill: 3 -  Follow up in one month  2. Cough - Likely from lisinopril - Will change to Losartan  - DG Chest 2 View; Future - Follow up with in one month when she returns for blood pressure check   3. Sleep disturbance  - traZODone (DESYREL) 50 MG tablet; Take 0.5 tablets (25 mg total) by mouth at bedtime.  Dispense: 30 tablet; Refill: 3  4. Other fatigue - Possibly from lisinopril?  - DG Chest 2 View; Future - Lab work, including TSH was normal one month ago  - Advised to get outside and exercise 5. Loss of weight - Unknown cause - lab work done last month was normal - Will get chest x ray  - May need to possibly get CT  6. Need for prophylactic vaccination and inoculation against influenza - Flu vaccine HIGH DOSE PF (Fluzone High dose)  [employer]

## 2015-11-15 NOTE — Patient Instructions (Signed)
It was great seeing you again  I have changed lisinopril to Losartan for your blood pressure  Trazadone  25 mg to help you sleep  Get a chest x ray   Please follow up with me in one month

## 2015-11-19 ENCOUNTER — Other Ambulatory Visit: Payer: Self-pay | Admitting: Adult Health

## 2015-11-19 ENCOUNTER — Other Ambulatory Visit: Payer: Self-pay

## 2015-11-19 ENCOUNTER — Telehealth: Payer: Self-pay | Admitting: Adult Health

## 2015-11-19 MED ORDER — PREDNISONE 10 MG PO TABS: ORAL_TABLET | ORAL | 0 refills | Status: DC

## 2015-11-19 NOTE — Telephone Encounter (Signed)
Spoke with Tonya Bartlett and informed her of her x ray results.   She continues to have a dry cough despite switching from lisinopril   X ray showed  Stable mild chronic bronchitic changes. Previous granulomatous infection. No evidence of pneumonia nor other acute cardiopulmonary Abnormality.  We will try a short prednisone course

## 2015-12-09 ENCOUNTER — Ambulatory Visit (INDEPENDENT_AMBULATORY_CARE_PROVIDER_SITE_OTHER): Payer: Medicare Other | Admitting: Neurology

## 2015-12-09 ENCOUNTER — Encounter: Payer: Self-pay | Admitting: Neurology

## 2015-12-09 VITALS — BP 115/73 | HR 70 | Ht 66.0 in | Wt 204.0 lb

## 2015-12-09 DIAGNOSIS — G3184 Mild cognitive impairment, so stated: Secondary | ICD-10-CM | POA: Diagnosis not present

## 2015-12-09 NOTE — Progress Notes (Signed)
[facility] [address] [place]. [place] [address] [phone]       OFFICE FOLLOW UP VISIT NOTE  Ms. Noel Christmas Canner Date of Birth:  [birth year] Medical Record Number:  [id number]   Referring MD:  [name], NP  Reason for Referral:  Memory loss HPI: Initial Consult 09/19/15 :  Ms Bibeau is a 34 year Caucasian lady who for the last year and a half has been having mild memory and cognitive difficulties. She often misplaces objects but can remember later. She is also having word finding issues and trouble getting her words out. At times she has had trouble driving to places that she has driven down  To numerous times before and yet for a few seconds cannot remember where it done. She is also forgetting names of the patient's. 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 There is no history of strokes seizures significant head injury with loss of consciousness. There is no family history of Alzheimer's dementia. Patient has not had any brain imaging studies done on November done for reversible causes of cognitive impairment. She denies feeling depressed. Past medical history significant for irritable bowel syndrome and syncopal episodes. Update 12/09/2015 : She returns for follow-up after last visit 2 and half months ago. She continues from mild short-term memory difficulties but these appear to be stable and not getting worse. Patient did not fill the prescription for self: She forgot. She had MRI scan the brain done on 10/09/2015 which I personally reviewed and shows only mild changes of chronic microvascular ischemia without any structural lesions tumor infarcts. Lab work done on 8/ 3/17 showed normal vitamin B12, TSH and RPR. EEG done on 10/24/15 was normal. Patient was interested in participating in the [facility] early  dementia study but she does not have a reliable caregiver to help her participate. She states he is taking Celexa and her depression is well controlled. She has no new complaints today. ROS:   14 system review of systems is positive for  weight loss, fatigue, hearing loss, trouble swallowing, cough, memory loss, headache, weakness, difficulty swallowing passing out, depression, decreased energy, sleepiness, restless legs and all other systems negative  PMH:  Past Medical History:  Diagnosis Date  . Allergic rhinitis   . Depression   . Diverticulosis   . Frequency of urination   . GERD (gastroesophageal reflux disease)   . 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 History of iron deficiency anemia   . History of migraine headaches   . Hyperlipidemia   . Migraine headache   . OA (osteoarthritis)    RIGHT SHOULDER  . Recovering alcoholic ([place])    SINCE [id number]  . Right rotator cuff tear   . Unspecified essential hypertension     Social History:  Social History   Social History  . Marital status: Widowed    Spouse name: N/A  . Number of children: N/A  . Years of education: N/A   Occupational History  . Not on file.   Social History Main Topics  . Smoking status: Former Smoker    Packs/day: 0.50    Years: 15.00    Types: Cigarettes    Quit date: 05/15/1985  . Smokeless tobacco: Never Used  . Alcohol use No  Comment: RECOVERING ALCOHOLIC--  QUIT [id number]  . Drug use: No  . Sexual activity: No   Other Topics Concern  . 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 acetaminophen (TYLENOL) 325 MG tablet Take by mouth.    . betamethasone dipropionate 0.05 % lotion Apply topically 2 (two) times daily.  60 mL 4  . calcium citrate-vitamin D (CITRACAL+D) 315-200 MG-UNIT tablet Take 1 tablet by mouth 2 (two) times daily.    . citalopram (CELEXA) 40 MG tablet Take 1 tablet (40 mg total) by mouth daily. 90 tablet 3  . losartan (COZAAR) 50 MG tablet Take 1 tablet (50 mg total) by mouth daily. 30 tablet 3  . omeprazole (PRILOSEC) 40 MG capsule Take 1 capsule (40 mg total) by mouth daily. 30 capsule 3  . ondansetron (ZOFRAN ODT) 4 MG disintegrating tablet Take 1 tablet (4 mg total) by mouth every 8 (eight) hours as needed for nausea or vomiting. 20 tablet 0  . predniSONE (DELTASONE) 10 MG tablet 40 mg x 3 days, 20 mg x 3 days, 10 mg x 3 days 21 tablet 0  . SUMAtriptan (IMITREX) 100 MG tablet Take 1 tablet (100 mg total) by mouth every 2 (two) hours as needed for migraine. May repeat in 2 hours if headache persists or recurs. 10 tablet 6  . traZODone (DESYREL) 50 MG tablet Take 0.5 tablets (25 mg total) by mouth at bedtime. 30 tablet 3   No current facility-administered medications on file prior to visit.     Allergies:   Allergies  Allergen Reactions  . Penicillins Anaphylaxis  . Erythromycin Other (See Comments)    SEVERE STOMACH CRAMPS  . Morphine And Related Nausea And Vomiting  . Nsaids Other (See Comments)    SEVERE STOMACH CRAMPS  . Nickel Rash    Physical Exam General: well developed, well nourished, seated, in no evident distress Head: head normocephalic and atraumatic.   Neck: supple with no carotid or supraclavicular bruits Cardiovascular: regular rate and rhythm, no murmurs Musculoskeletal: no deformity Skin:  no rash/petichiae Vascular:  Normal pulses all extremities  Neurologic Exam Mental Status: Awake and fully alert. Oriented to place and time. Recent and remote memory intact. Attention span, concentration and fund of knowledge appropriate. Mood and affect appropriate. Mini-Mental status exam scored 3/30 with no deficits. G . Able to name 11 four legged animals. Clock  drawing 4/4. Able to copy intersecting pentagons quite well. Cranial Nerves: Fundoscopic exam reveals sharp disc margins. Pupils equal, briskly reactive to light. Extraocular movements full without nystagmus. Visual fields full to confrontation. Hearing intact. Facial sensation intact. Face, tongue, palate moves normally and symmetrically.  Motor: Normal bulk and tone. Normal strength in all tested extremity muscles. Sensory.: intact to touch , pinprick , position and vibratory sensation.  Coordination: Rapid alternating movements normal in all extremities. Finger-to-nose and heel-to-shin performed accurately bilaterally. Gait and Station: Arises from chair without difficulty. Stance is normal. Gait demonstrates normal stride length and balance . Able to heel, toe and tandem walk without difficulty.  Reflexes: 1+ and symmetric. Toes downgoing.       ASSESSMENT: 65 year Caucasian lady with mild memory and cognitive difficulties  likely due to mild cognitive impairment.    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 I encouraged her to start taking set of falling daily and to continue participation in cognitively challenging activities like solving crossword puzzles, sudoku and playing bridge. We also talked about memory compensation strategies. She will return for follow-up in the future in 6 months or call earlier if necessary.. Greater than 50% time during this 25 minute  visit was spent on counseling and coordination of care about her memory loss and cognitive impairment. She will return for follow-up in 6 months or call earlier if necessary. Antony Contras, MD  [facility] [address] [place], [place] [address]  Phone [phone] Fax [phone] Note: This document was prepared with digital dictation and possible smart phrase technology. Any transcriptional  errors that result from this process are unintentional.

## 2015-12-09 NOTE — Patient Instructions (Addendum)
I Memory Compensation Strategies  1. Use "WARM" strategy.  W= write it down  A= associate it  R= repeat it  M= make a mental note  2.   You can keep a Social worker.  Use a 3-ring notebook with sections for the following: calendar, important names and phone numbers,  medications, doctors' names/phone numbers, lists/reminders, and a section to journal what you did  each day.   3.    Use a calendar to write appointments down.  4.    Write yourself a schedule for the day.  This can be placed on the calendar or in a separate section of the Memory Notebook.  Keeping a  regular schedule can help memory.  5.    Use medication organizer with sections for each day or morning/evening pills.  You may need help loading it  6.    Keep a basket, or pegboard by the door.  Place items that you need to take out with you in the basket or on the pegboard.  You may also want to  include a message board for reminders.  7.    Use sticky notes.  Place sticky notes with reminders in a place where the task is performed.  For example: " turn off the  stove" placed by the stove, "lock the door" placed on the door at eye level, " take your medications" on  the bathroom mirror or by the place where you normally take your medications.  8.    Use alarms/timers.  Use while cooking to remind yourself to check on food or as a reminder to take your medicine, or as a  reminder to make a call, or as a reminder to perform another task, etc.

## 2015-12-12 ENCOUNTER — Other Ambulatory Visit: Payer: Self-pay

## 2015-12-12 ENCOUNTER — Telehealth: Payer: Self-pay | Admitting: Adult Health

## 2015-12-12 MED ORDER — TRAZODONE HCL 50 MG PO TABS: 50.0000 mg | ORAL_TABLET | Freq: Every evening | ORAL | 1 refills | Status: DC | PRN

## 2015-12-12 NOTE — Telephone Encounter (Signed)
Rx has been sent in. 

## 2015-12-12 NOTE — Telephone Encounter (Signed)
Ok to change prescription to 1 pill ( 25mg ) nightly. 90 pills with 1 refill

## 2015-12-12 NOTE — Telephone Encounter (Signed)
Please advise 

## 2015-12-12 NOTE — Telephone Encounter (Signed)
° ° °  Pt request refill of the following:   traZODone (DESYREL) 50 MG tablet   Pt said she need to take 1 pill a night instead of a 1/2 pill.  She need to have the rx rewritten     Phamacy:  [facility]

## 2015-12-13 ENCOUNTER — Other Ambulatory Visit: Payer: Self-pay

## 2015-12-13 DIAGNOSIS — I10 Essential (primary) hypertension: Secondary | ICD-10-CM

## 2015-12-13 MED ORDER — LOSARTAN POTASSIUM 50 MG PO TABS: 50.0000 mg | ORAL_TABLET | Freq: Every day | ORAL | 3 refills | Status: DC

## 2015-12-19 ENCOUNTER — Telehealth: Payer: Self-pay | Admitting: Neurology

## 2015-12-19 NOTE — Telephone Encounter (Signed)
Yes I want her on it if she is willing to pay for it. I will prescibe it

## 2015-12-19 NOTE — Telephone Encounter (Signed)
Patient called to advise, [facility] needs prescription for Cerefolin NAC faxed to [phone].

## 2015-12-19 NOTE — Telephone Encounter (Signed)
Message sent to [facility]. Need to know if he wants pt on cerefolin.Its not mention in his last note. Need to know if he wants pt to be taking it. Pt wants a script for cerefolin.

## 2015-12-20 ENCOUNTER — Ambulatory Visit: Payer: Medicare Other | Admitting: Adult Health

## 2015-12-23 ENCOUNTER — Other Ambulatory Visit: Payer: Self-pay | Admitting: Neurology

## 2015-12-23 ENCOUNTER — Other Ambulatory Visit: Payer: Self-pay

## 2015-12-23 MED ORDER — CEREFOLIN 6-1-50-5 MG PO TABS: 1.0000 | ORAL_TABLET | ORAL | 3 refills | Status: AC

## 2015-12-23 MED ORDER — CEREFOLIN 6-1-50-5 MG PO TABS: 1.0000 | ORAL_TABLET | ORAL | 3 refills | Status: DC

## 2015-12-24 ENCOUNTER — Ambulatory Visit (INDEPENDENT_AMBULATORY_CARE_PROVIDER_SITE_OTHER): Payer: Medicare Other | Admitting: Adult Health

## 2015-12-24 ENCOUNTER — Encounter: Payer: Self-pay | Admitting: Adult Health

## 2015-12-24 VITALS — BP 134/86 | HR 68 | Temp 98.0°F | Resp 20 | Ht 66.0 in | Wt 204.0 lb

## 2015-12-24 DIAGNOSIS — R5383 Other fatigue: Secondary | ICD-10-CM | POA: Diagnosis not present

## 2015-12-24 DIAGNOSIS — I10 Essential (primary) hypertension: Secondary | ICD-10-CM

## 2015-12-24 NOTE — Telephone Encounter (Signed)
[facility].

## 2015-12-24 NOTE — Progress Notes (Signed)
Subjective:    Patient ID: Tonya Bartlett, female    DOB: Jan 12, 1946, 70 y.o.   MRN: [id number]  HPI  70 year old female who presents to the office today for one month follow up regarding hypertension. During her last visit she was switched from Lisinopril to Coreg due to fatigue and a dry cough. Her chest x ray was negative.   Today in the office she reports that since changing her hypertensive medication that her cough has resolved and her mood has improved. She states "  Am feeling great" . She has been getting out of the house more often and is exercising.    BP Readings from Last 3 Encounters:  12/24/15 134/86  12/09/15 115/73  11/15/15 140/78     Review of Systems  Constitutional: Negative.   HENT: Negative.   Eyes: Negative.   Respiratory: Negative.   Cardiovascular: Negative.   Neurological: Negative.   Psychiatric/Behavioral: Negative.   All other systems reviewed and are negative.  Past Medical History:  Diagnosis Date  . Allergic rhinitis   . Depression   . Diverticulosis   . Frequency of urination   . GERD (gastroesophageal reflux disease)   . 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 History of iron deficiency anemia   . History of migraine headaches   . Hyperlipidemia   . Migraine headache   . OA (osteoarthritis)    RIGHT SHOULDER  . Recovering alcoholic ([place])    SINCE [id number]  . Right rotator cuff tear   . Unspecified essential hypertension     Social History   Social History  . Marital status: Widowed    Spouse name: N/A  . Number of children: N/A  . Years of education: N/A   Occupational History  . Not on file.   Social History Main Topics  . Smoking status: Former Smoker    Packs/day: 0.50    Years: 15.00    Types: Cigarettes    Quit date: 05/15/1985  . Smokeless tobacco: Never Used  . Alcohol use No     Comment: RECOVERING ALCOHOLIC--  QUIT [id number]  . Drug use: No  . Sexual activity: No   Other  Topics Concern  . 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 BUNIONECTOMY/  HAMMERTOE CORRECTION  RIGHT FOOT  2011  . CATARACT EXTRACTION W/ INTRAOCULAR LENS  IMPLANT, BILATERAL    . KNEE ARTHROSCOPY W/ MENISCECTOMY Bilateral X2  LEFT /    X1  RIGHT  . REPLACEMENT TOTAL KNEE Left 2006  . SHOULDER ARTHROSCOPY WITH SUBACROMIAL DECOMPRESSION, ROTATOR CUFF REPAIR AND BICEP TENDON REPAIR Right 05/23/2013   Procedure: RIGHT SHOULDER ARTHROSCOPY EXAM UNDER ANESTHESIA  WITH SUBACROMIAL DECOMPRESSION,DISTAL CLAVICLE RESECTION, SADLABRAL DEBRIDEMENT CHONDROPLASTY, BICEP TENOTOMY ;  Surgeon: Sydnee Cabal, MD;  Location: [facility];  Service: Orthopedics;  Laterality: Right;  . TONSILLECTOMY AND ADENOIDECTOMY  AGE 53  . TOTAL KNEE ARTHROPLASTY Bilateral LEFT  1996/   RIGHT 2004  . VAGINAL HYSTERECTOMY  1976    Family History  Problem Relation Age of Onset  . Heart disease Father 35  . Hypertension Father   . Melanoma Mother   .  Cancer Sister     skin CA, 2 sisters  . Esophageal cancer Brother   . Dementia Paternal Grandfather     Allergies  Allergen Reactions  . Penicillins Anaphylaxis  . Erythromycin Other (See Comments)    SEVERE STOMACH CRAMPS  . Morphine And Related Nausea And Vomiting  . Nsaids Other (See Comments)    SEVERE STOMACH CRAMPS  . Nickel Rash    Current Outpatient Prescriptions on File Prior to Visit  Medication Sig Dispense Refill  . acetaminophen (TYLENOL) 325 MG tablet Take by mouth.    . betamethasone dipropionate 0.05 % lotion Apply topically 2 (two) times daily. 60 mL 4  . calcium citrate-vitamin D (CITRACAL+D) 315-200 MG-UNIT tablet Take 1 tablet by mouth 2 (two) times daily.    . citalopram (CELEXA) 40 MG tablet Take 1 tablet (40 mg total) by mouth daily.  90 tablet 3  . L-Methylfolate-B12-B6-B2 (CEREFOLIN) 07-17-48-5 MG TABS Take 1 tablet by mouth 1 day or 1 dose. 90 each 3  . losartan (COZAAR) 50 MG tablet Take 1 tablet (50 mg total) by mouth daily. 30 tablet 3  . omeprazole (PRILOSEC) 40 MG capsule Take 1 capsule (40 mg total) by mouth daily. 30 capsule 3  . ondansetron (ZOFRAN ODT) 4 MG disintegrating tablet Take 1 tablet (4 mg total) by mouth every 8 (eight) hours as needed for nausea or vomiting. 20 tablet 0  . SUMAtriptan (IMITREX) 100 MG tablet Take 1 tablet (100 mg total) by mouth every 2 (two) hours as needed for migraine. May repeat in 2 hours if headache persists or recurs. 10 tablet 6  . traZODone (DESYREL) 50 MG tablet Take 1 tablet (50 mg total) by mouth at bedtime as needed for sleep. 90 tablet 1   No current facility-administered medications on file prior to visit.     BP 134/86 (BP Location: Left Arm, Patient Position: Sitting, Cuff Size: Large)   Pulse 68   Temp 98 F (36.7 C) (Oral)   Resp 20   Ht 5\' 6"  (1.676 m)   Wt 204 lb (92.5 kg)   SpO2 98%   BMI 32.93 kg/m       Objective:   Physical Exam  Constitutional: She is oriented to person, place, and time. She appears well-developed and well-nourished. No distress.  Cardiovascular: Normal rate, regular rhythm, normal heart sounds and intact distal pulses.  Exam reveals no gallop and no friction rub.   No murmur heard. Pulmonary/Chest: Effort normal and breath sounds normal. No respiratory distress. She has no wheezes. She has no rales. She exhibits no tenderness.  Neurological: She is alert and oriented to person, place, and time.  Skin: Skin is warm and dry. No rash noted. She is not diaphoretic. No erythema. No pallor.  Psychiatric: She has a normal mood and affect. Her behavior is normal. Thought content normal.  Nursing note and vitals reviewed.     Assessment & Plan:  1. Essential hypertension, benign - Near goal.  - Will keep her on her current dose  2.  Fatigue, unspecified type - Has resolved.  - Advised continued exercise and eating healthy   [name], NP

## 2015-12-24 NOTE — Patient Instructions (Signed)
It was great seeing you again and I am glad you are feeling better!   I am not going to change anything at this time

## 2015-12-24 NOTE — Progress Notes (Signed)
Pre visit review using our clinic review tool, if applicable. No additional management support is needed unless otherwise documented below in the visit note. 

## 2015-12-24 NOTE — Telephone Encounter (Signed)
LFt vm that [facility]ref[facility].

## 2015-12-27 ENCOUNTER — Encounter: Payer: Self-pay | Admitting: Gastroenterology

## 2015-12-27 ENCOUNTER — Ambulatory Visit (INDEPENDENT_AMBULATORY_CARE_PROVIDER_SITE_OTHER): Payer: Medicare Other | Admitting: Gastroenterology

## 2015-12-27 VITALS — BP 114/80 | HR 68 | Ht 65.5 in | Wt 206.0 lb

## 2015-12-27 DIAGNOSIS — K529 Noninfective gastroenteritis and colitis, unspecified: Secondary | ICD-10-CM | POA: Diagnosis not present

## 2015-12-27 DIAGNOSIS — K219 Gastro-esophageal reflux disease without esophagitis: Secondary | ICD-10-CM | POA: Diagnosis not present

## 2015-12-27 DIAGNOSIS — Z86010 Personal history of colonic polyps: Secondary | ICD-10-CM | POA: Diagnosis not present

## 2015-12-27 DIAGNOSIS — R935 Abnormal findings on diagnostic imaging of other abdominal regions, including retroperitoneum: Secondary | ICD-10-CM | POA: Diagnosis not present

## 2015-12-27 MED ORDER — OMEPRAZOLE 20 MG PO CPDR: 20.0000 mg | DELAYED_RELEASE_CAPSULE | Freq: Every day | ORAL | 3 refills | Status: DC

## 2015-12-27 MED ORDER — NA SULFATE-K SULFATE-MG SULF 17.5-3.13-1.6 GM/177ML PO SOLN: 1.0000 | Freq: Once | ORAL | 0 refills | Status: AC

## 2015-12-27 NOTE — Progress Notes (Signed)
HPI :  70 year old female here for follow-up visit. She was previously seen for dysphagia and reflux at the last visit. Provided her a course of omeprazole 40mg  po q day and underwent EGD on 09/26/2015 - 4cm hiatal hernia, otherwise normal esophagus, empirically dilated to 34mm [place]. Mild gastritis noted. HP negative. She reports the dilation helped her dysphagia and not bothering her at present. She reports omeprazole is helping the heartburn an no longer bothering her. In regards to these symptoms overall she is much improved  She reports being admitted to a hospital in [facility] in August. She had severe abdominal cramps with this and "passed out" due to pain. Pain started first, and then passed red blood per rectum. She reports bleeding stopped on its own. She developed abdominal pain first, then developed hematochezia. CT scan reportedly showed "descending colitis". She had a colonoscopy done on 8/30 showing sigmoid inflammation, diverticulosis, hemorrhoids, and multiple polyps reported - multiple polyps noted but not removed - reported in media section of Epic. Pathology results not available.   She reports her bowels have remained slightly altered since her hospitalization. She reports passing stool 2-3 times per day, usually small volume, stools usually loose. No abdominal pains now. She denies any NSAIDs.She has not taken any imitrex. No new medications. Liver appeared normal on CT (she has a history of alcoholism)  Colonoscopy 10/29/06 - diverticulosis, otherwise normal   Past Medical History:  Diagnosis Date  . Allergic rhinitis   . Colitis   . Depression   . Diverticulosis   . Frequency of urination   . GERD (gastroesophageal reflux disease)   . 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 History of iron deficiency anemia   . History of migraine headaches   . Hyperlipidemia   . Migraine headache   . OA (osteoarthritis)    RIGHT SHOULDER  . 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 Right rotator cuff tear   . Unspecified essential hypertension      Past Surgical History:  Procedure Laterality Date  . BUNIONECTOMY/  HAMMERTOE CORRECTION  RIGHT FOOT  2011  . CATARACT EXTRACTION W/ INTRAOCULAR LENS  IMPLANT, BILATERAL    . KNEE ARTHROSCOPY W/ MENISCECTOMY Bilateral X2  LEFT /    X1  RIGHT  . REPLACEMENT TOTAL KNEE Left 2006  . SHOULDER ARTHROSCOPY WITH SUBACROMIAL DECOMPRESSION, ROTATOR CUFF REPAIR AND BICEP TENDON REPAIR Right 05/23/2013   Procedure: RIGHT SHOULDER ARTHROSCOPY EXAM UNDER ANESTHESIA  WITH SUBACROMIAL DECOMPRESSION,DISTAL CLAVICLE RESECTION, SADLABRAL DEBRIDEMENT CHONDROPLASTY, BICEP TENOTOMY ;  Surgeon: Sydnee Cabal, MD;  Location: [facility];  Service: Orthopedics;  Laterality: Right;  . TONSILLECTOMY AND ADENOIDECTOMY  AGE 81  . TOTAL KNEE ARTHROPLASTY Bilateral LEFT  1996/   RIGHT 2004  . VAGINAL HYSTERECTOMY  1976   Family History  Problem Relation Age of Onset  . Heart disease Father 69  . Hypertension Father   . Melanoma Mother   . Cancer Sister     skin CA, 2 sisters  . Esophageal cancer Brother   . Dementia Paternal Grandfather    Social History  Substance Use Topics  . Smoking status: Former Smoker    Packs/day: 0.50    Years: 15.00    Types: Cigarettes    Quit date: 05/15/1985  . Smokeless tobacco: Never Used  . Alcohol use No     Comment: RECOVERING ALCOHOLIC--  [id number] [id number]   Current  Outpatient Prescriptions  Medication Sig Dispense Refill  . acetaminophen (TYLENOL) 325 MG tablet Take 325 mg by mouth as needed.     . betamethasone dipropionate 0.05 % lotion Apply topically 2 (two) times daily. 60 mL 4  . calcium citrate-vitamin D (CITRACAL+D) 315-200 MG-UNIT tablet Take 1 tablet by mouth 2 (two) times daily.    . citalopram (CELEXA) 40 MG tablet Take 1 tablet (40 mg total) by mouth daily. 90 tablet 3  . losartan (COZAAR) 50 MG tablet Take 1 tablet (50 mg total) by  mouth daily. 30 tablet 3  . omeprazole (PRILOSEC) 40 MG capsule Take 1 capsule (40 mg total) by mouth daily. 30 capsule 3  . ondansetron (ZOFRAN ODT) 4 MG disintegrating tablet Take 1 tablet (4 mg total) by mouth every 8 (eight) hours as needed for nausea or vomiting. 20 tablet 0  . SUMAtriptan (IMITREX) 100 MG tablet Take 1 tablet (100 mg total) by mouth every 2 (two) hours as needed for migraine. May repeat in 2 hours if headache persists or recurs. 10 tablet 6  . traZODone (DESYREL) 50 MG tablet Take 1 tablet (50 mg total) by mouth at bedtime as needed for sleep. 90 tablet 1   No current facility-administered medications for this visit.    Allergies  Allergen Reactions  . Penicillins Anaphylaxis  . Erythromycin Other (See Comments)    SEVERE STOMACH CRAMPS  . Morphine And Related Nausea And Vomiting  . Nsaids Other (See Comments)    SEVERE STOMACH CRAMPS  . Nickel Rash     Review of Systems: All systems reviewed and negative except where noted in HPI.    Lab Results  Component Value Date   WBC 5.1 10/07/2015   HGB 12.8 10/07/2015   HCT 37.3 10/07/2015   MCV 96.0 10/07/2015   PLT 322.0 10/07/2015    Lab Results  Component Value Date   CREATININE 0.59 10/07/2015   BUN 7 10/07/2015   NA 138 10/07/2015   K 3.8 10/07/2015   CL 104 10/07/2015   CO2 28 10/07/2015    Lab Results  Component Value Date   ALT 18 10/07/2015   AST 20 10/07/2015   ALKPHOS 66 10/07/2015   BILITOT 0.5 10/07/2015     Physical Exam: BP 114/80 (BP Location: Left Arm, Patient Position: Sitting, Cuff Size: Normal)   Pulse 68   Ht 5' 5.5" (1.664 m) Comment: height measured without shoes  Wt 206 lb (93.4 kg)   BMI 33.76 kg/m  Constitutional: Pleasant,well-developed, female in no acute distress. HEENT: Normocephalic and atraumatic. Conjunctivae are normal. No scleral icterus. Neck supple.  Cardiovascular: Normal rate, regular rhythm.  Pulmonary/chest: Effort normal and breath sounds normal.  No wheezing, rales or rhonchi. Abdominal: Soft, nondistended, nontender. There are no masses palpable. No hepatomegaly. Extremities: no edema Lymphadenopathy: No cervical adenopathy noted. Neurological: Alert and oriented to person place and time. Skin: Skin is warm and dry. No rashes noted. Psychiatric: Normal mood and affect. Behavior is normal.   ASSESSMENT AND PLAN: 70 year old female here for reassessment of the following issues:  "Colitis" / abnormal CT scan abdomen - history as above, her symptoms were concerning for ischemic colitis, will obtain pathology report from colonoscopy to determine if this was present on biopsies versus possible infectious colitis with diverticular related bleeding. She had not been taking any Imitrex or NSAIDs around this episode. If she did have ischemic colitis, unclear precipitant, and it appeared to be mild. Her symptoms have been or currently resolved.  She should avoid NSAIDs, she states she doesn't take imitrex anymore. Will await colonoscopy as below.  Colon polyps - noted on recent colonoscopy but not removed in the setting of bleeding. Recommend optical colonoscopy with polypectomy. Discussed risks and benefits of colonoscopy and anesthesia with her, she wished to proceed.  Dysphagia/GERD - dysphasia resolved with empiric dilation, GERD well-controlled with omeprazole 40 mg daily. I discussed long-term risks of PPI therapy with her, and recommend low-dose dose needed to control symptoms or an alternative regimen. She will decrease omeprazole to 20 mg day and see if this works to control symptoms. Over the upcoming weeks if it does we may consider switching to Zantac or as needed therapy. She agreed  Moniteau Cellar, MD [facility] Pager [phone]

## 2015-12-27 NOTE — Patient Instructions (Signed)
If you are age 72 or older, your body mass index should be between 23-30. Your Body mass index is 33.76 kg/m. If this is out of the aforementioned range listed, please consider follow up with your Primary Care Provider.  If you are age 58 or younger, your body mass index should be between 19-25. Your Body mass index is 33.76 kg/m. If this is out of the aformentioned range listed, please consider follow up with your Primary Care Provider.   We have sent the following medications to your pharmacy for you to pick up at your convenience:  Omeprazole 20mg . Take one tablet daily.  Suprep  You have been scheduled for a colonoscopy. Please follow written instructions given to you at your visit today.  Please pick up your prep supplies at the pharmacy within the next 1-3 days. If you use inhalers (even only as needed), please bring them with you on the day of your procedure. Your physician has requested that you go to www.startemmi.com and enter the access code given to you at your visit today. This web site gives a general overview about your procedure. However, you should still follow specific instructions given to you by our office regarding your preparation for the procedure.

## 2016-01-07 ENCOUNTER — Other Ambulatory Visit: Payer: Self-pay | Admitting: Family Medicine

## 2016-01-07 DIAGNOSIS — Z1231 Encounter for screening mammogram for malignant neoplasm of breast: Secondary | ICD-10-CM

## 2016-01-24 ENCOUNTER — Encounter: Payer: Medicare Other | Admitting: Gastroenterology

## 2016-02-01 ENCOUNTER — Ambulatory Visit (INDEPENDENT_AMBULATORY_CARE_PROVIDER_SITE_OTHER): Payer: Medicare Other

## 2016-02-01 ENCOUNTER — Encounter (HOSPITAL_COMMUNITY): Payer: Self-pay | Admitting: *Deleted

## 2016-02-01 ENCOUNTER — Ambulatory Visit (HOSPITAL_COMMUNITY)
Admission: EM | Admit: 2016-02-01 | Discharge: 2016-02-01 | Disposition: A | Discharge status: Home / Self Care | Payer: Medicare Other

## 2016-02-01 DIAGNOSIS — T07XXXA Unspecified multiple injuries, initial encounter: Secondary | ICD-10-CM

## 2016-02-01 DIAGNOSIS — W19XXXA Unspecified fall, initial encounter: Secondary | ICD-10-CM

## 2016-02-01 DIAGNOSIS — S299XXA Unspecified injury of thorax, initial encounter: Secondary | ICD-10-CM | POA: Diagnosis not present

## 2016-02-01 DIAGNOSIS — R0781 Pleurodynia: Secondary | ICD-10-CM | POA: Diagnosis not present

## 2016-02-01 MED ORDER — HYDROCODONE-ACETAMINOPHEN 5-325 MG PO TABS: 2.0000 | ORAL_TABLET | ORAL | 0 refills | Status: DC | PRN

## 2016-02-01 MED ORDER — METHYLPREDNISOLONE 4 MG PO TBPK: ORAL_TABLET | ORAL | 0 refills | Status: DC

## 2016-02-01 NOTE — ED Triage Notes (Addendum)
Reports fall after stumbling down steep concrete driveway 3 days ago.  Hit right forehead on concrete - no LOC or neck pain.  Originally had HA - resolved after Tylenol.  C/O continued significant pain in right upper chest radiating down anterior ribcage and in right thoracic back.  Denies SOB, but c/o very painful breathing, palpation, and position changes.  Ecchymosis noted to bilat knees.  Describes previous injury to chest requiring surgery.

## 2016-02-01 NOTE — ED Provider Notes (Signed)
CSN: [id number]     Arrival date & time 02/01/16  1202 History   First MD Initiated Contact with Patient 02/01/16 1234     Chief Complaint  Patient presents with  . Fall   (Consider location/radiation/quality/duration/timing/severity/associated sxs/prior Treatment) Patient fell 3 days ago and c/o difficulty taking deep breath and chest wall pain. She has multiple contusions.   The history is provided by the patient.  Fall  This is a new problem. The current episode started more than 1 week ago. The problem occurs constantly. The problem has not changed since onset.Associated symptoms include chest pain. Nothing aggravates the symptoms. Nothing relieves the symptoms.    Past Medical History:  Diagnosis Date  . Allergic rhinitis   . Colitis   . Depression   . Diverticulosis   . Frequency of urination   . GERD (gastroesophageal reflux disease)   . 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 History of iron deficiency anemia   . History of migraine headaches   . Hyperlipidemia   . Migraine headache   . OA (osteoarthritis)    RIGHT SHOULDER  . Recovering alcoholic ([place])    SINCE [id number]  . Right rotator cuff tear   . Unspecified essential hypertension    Past Surgical History:  Procedure Laterality Date  . BUNIONECTOMY/  HAMMERTOE CORRECTION  RIGHT FOOT  2011  . CATARACT EXTRACTION W/ INTRAOCULAR LENS  IMPLANT, BILATERAL    . KNEE ARTHROSCOPY W/ MENISCECTOMY Bilateral X2  LEFT /    X1  RIGHT  . REPLACEMENT TOTAL KNEE Left 2006  . SHOULDER ARTHROSCOPY WITH SUBACROMIAL DECOMPRESSION, ROTATOR CUFF REPAIR AND BICEP TENDON REPAIR Right 05/23/2013   Procedure: RIGHT SHOULDER ARTHROSCOPY EXAM UNDER ANESTHESIA  WITH SUBACROMIAL DECOMPRESSION,DISTAL CLAVICLE RESECTION, SADLABRAL DEBRIDEMENT CHONDROPLASTY, BICEP TENOTOMY ;  Surgeon: Sydnee Cabal, MD;  Location: [facility];  Service: Orthopedics;  Laterality: Right;  . TONSILLECTOMY AND  ADENOIDECTOMY  AGE 17  . TOTAL KNEE ARTHROPLASTY Bilateral LEFT  1996/   RIGHT 2004  . VAGINAL HYSTERECTOMY  1976   Family History  Problem Relation Age of Onset  . Heart disease Father 37  . Hypertension Father   . Melanoma Mother   . Cancer Sister     skin CA, 2 sisters  . Esophageal cancer Brother   . Dementia Paternal Grandfather    Social History  Substance Use Topics  . Smoking status: Former Smoker    Packs/day: 0.50    Years: 15.00    Types: Cigarettes    Quit date: 05/15/1985  . Smokeless tobacco: Never Used  . Alcohol use No     Comment: RECOVERING ALCOHOLIC--  QUIT [id number]   OB History    No data available     Review of Systems  Constitutional: Negative.   HENT: Negative.   Eyes: Negative.   Cardiovascular: Positive for chest pain.  Gastrointestinal: Negative.   Endocrine: Negative.   Genitourinary: Negative.   Musculoskeletal: Negative.   Skin: Negative.   Allergic/Immunologic: Negative.   Neurological: Negative.   Hematological: Negative.   Psychiatric/Behavioral: Negative.     Allergies  Penicillins; Erythromycin; Morphine and related; Nsaids; and Nickel  Home Medications   Prior to Admission medications   Medication Sig Start Date End Date Taking? Authorizing Provider  acetaminophen (TYLENOL) 325 MG tablet Take 325 mg by mouth as needed.    Yes Historical Provider, MD  calcium citrate-vitamin D (CITRACAL+D) 315-200 MG-UNIT tablet Take  1 tablet by mouth 2 (two) times daily.   Yes Historical Provider, MD  citalopram (CELEXA) 40 MG tablet Take 1 tablet (40 mg total) by mouth daily. 08/30/15  Yes [name], NP  losartan (COZAAR) 50 MG tablet Take 1 tablet (50 mg total) by mouth daily. 12/13/15  Yes [name], NP  omeprazole (PRILOSEC) 20 MG capsule Take 1 capsule (20 mg total) by mouth daily. 12/27/15  Yes Manus Gunning, MD  SUMAtriptan (IMITREX) 100 MG tablet Take 1 tablet (100 mg total) by mouth every 2 (two) hours as needed for  migraine. May repeat in 2 hours if headache persists or recurs. 09/27/15  Yes [name], NP  traZODone (DESYREL) 50 MG tablet Take 1 tablet (50 mg total) by mouth at bedtime as needed for sleep. 12/12/15  Yes [name], NP  UNABLE TO FIND Med Name: med for memory   Yes Historical Provider, MD  betamethasone dipropionate 0.05 % lotion Apply topically 2 (two) times daily. 08/30/15   [name], NP  HYDROcodone-acetaminophen (NORCO/VICODIN) 5-325 MG tablet Take 2 tablets by mouth every 4 (four) hours as needed. 02/01/16   Lysbeth Penner, FNP  methylPREDNISolone (MEDROL DOSEPAK) 4 MG TBPK tablet Take 6-5-4-3-2-1 po qd 02/01/16   Lysbeth Penner, FNP  ondansetron (ZOFRAN ODT) 4 MG disintegrating tablet Take 1 tablet (4 mg total) by mouth every 8 (eight) hours as needed for nausea or vomiting. 03/16/15   Pleas Koch, NP   Meds Ordered and Administered this Visit  Medications - No data to display  BP 110/67   Pulse 68   Temp 97.9 F (36.6 C) (Oral)   Resp 18   SpO2 100%  No data found.   Physical Exam  Constitutional: She is oriented to person, place, and time. She appears well-developed and well-nourished.  HENT:  Head: Normocephalic and atraumatic.  Right Ear: External ear normal.  Left Ear: External ear normal.  Mouth/Throat: Oropharynx is clear and moist.  Eyes: EOM are normal. Pupils are equal, round, and reactive to light.  Neck: Normal range of motion. Neck supple.  Cardiovascular: Normal rate, regular rhythm and normal heart sounds.   Pulmonary/Chest: Effort normal.  Musculoskeletal: She exhibits tenderness.  Neurological: She is alert and oriented to person, place, and time.  Nursing note and vitals reviewed.   Urgent Care Course   Clinical Course     Procedures (including critical care time)  Labs Review Labs Reviewed - No data to display  Imaging Review Dg Ribs Unilateral W/chest Right  Result Date: 02/01/2016 CLINICAL DATA:  Fall on driveway 4  days ago. Right rib and pleuritic chest pain. Initial encounter. EXAM: RIGHT RIBS AND CHEST - 3+ VIEW COMPARISON:  11/15/2015 FINDINGS: No fracture or other bone lesions are seen involving the ribs. There is no evidence of pneumothorax or pleural effusion. Both lungs are clear. Heart size and mediastinal contours are within normal limits. Calcified mediastinal lymph node in the AP window, consistent with old granulomatous disease. IMPRESSION: No evidence of right rib fracture or other acute findings. No active cardiopulmonary disease. Electronically Signed   By: Earle Gell M.D.   On: 02/01/2016 12:59     Visual Acuity Review  Right Eye Distance:   Left Eye Distance:   Bilateral Distance:    Right Eye Near:   Left Eye Near:    Bilateral Near:         MDM   1. Multiple contusions   2. Fall, initial encounter  3. Injury of chest wall, initial encounter    Hydrocodone 5/325 one po q 4 hours prn #10 Medrol dose pack as directed #21      Lysbeth Penner, FNP 02/01/16 1342

## 2016-02-03 DIAGNOSIS — S20211A Contusion of right front wall of thorax, initial encounter: Secondary | ICD-10-CM | POA: Diagnosis not present

## 2016-02-13 ENCOUNTER — Ambulatory Visit
Admission: RE | Admit: 2016-02-13 | Discharge: 2016-02-13 | Disposition: A | Discharge status: Home / Self Care | Payer: Medicare Other | Source: Ambulatory Visit | Attending: Family Medicine | Admitting: Family Medicine

## 2016-02-13 DIAGNOSIS — Z1231 Encounter for screening mammogram for malignant neoplasm of breast: Secondary | ICD-10-CM

## 2016-02-14 ENCOUNTER — Telehealth: Payer: Self-pay | Admitting: Gastroenterology

## 2016-02-18 ENCOUNTER — Encounter: Payer: Medicare Other | Admitting: Gastroenterology

## 2016-02-18 NOTE — Telephone Encounter (Signed)
No that's okay. Thanks 

## 2016-03-16 DIAGNOSIS — Z6833 Body mass index (BMI) 33.0-33.9, adult: Secondary | ICD-10-CM | POA: Diagnosis not present

## 2016-03-16 DIAGNOSIS — M5030 Other cervical disc degeneration, unspecified cervical region: Secondary | ICD-10-CM | POA: Diagnosis not present

## 2016-03-16 DIAGNOSIS — G5622 Lesion of ulnar nerve, left upper limb: Secondary | ICD-10-CM | POA: Diagnosis not present

## 2016-03-16 DIAGNOSIS — M150 Primary generalized (osteo)arthritis: Secondary | ICD-10-CM | POA: Diagnosis not present

## 2016-03-16 DIAGNOSIS — L4050 Arthropathic psoriasis, unspecified: Secondary | ICD-10-CM | POA: Diagnosis not present

## 2016-03-16 DIAGNOSIS — M25511 Pain in right shoulder: Secondary | ICD-10-CM | POA: Diagnosis not present

## 2016-03-16 DIAGNOSIS — M5136 Other intervertebral disc degeneration, lumbar region: Secondary | ICD-10-CM | POA: Diagnosis not present

## 2016-03-16 DIAGNOSIS — E669 Obesity, unspecified: Secondary | ICD-10-CM | POA: Diagnosis not present

## 2016-03-16 DIAGNOSIS — L401 Generalized pustular psoriasis: Secondary | ICD-10-CM | POA: Diagnosis not present

## 2016-04-01 ENCOUNTER — Other Ambulatory Visit: Payer: Self-pay

## 2016-04-01 ENCOUNTER — Telehealth: Payer: Self-pay | Admitting: Adult Health

## 2016-04-01 MED ORDER — SUMATRIPTAN SUCCINATE 100 MG PO TABS: 100.0000 mg | ORAL_TABLET | ORAL | 0 refills | Status: DC | PRN

## 2016-04-01 MED ORDER — OSELTAMIVIR PHOSPHATE 75 MG PO CAPS: 75.0000 mg | ORAL_CAPSULE | Freq: Two times a day (BID) | ORAL | 0 refills | Status: DC

## 2016-04-01 NOTE — Telephone Encounter (Signed)
Prescriptions have been sent in. Patient notified and verbalized understanding.

## 2016-04-01 NOTE — Telephone Encounter (Signed)
Ok to send to pharmacy in [place].   Tamiflu 75 mg BID x 5 days   Sumatriptan with no refills at this time

## 2016-04-01 NOTE — Telephone Encounter (Signed)
Pt is in [place] visiting her grandchild who was given tamiflu due to fever. Pt would like tamiiflu sent to [facility] in [place]. Pt also needs sumatriptan 100 mg

## 2016-04-01 NOTE — Telephone Encounter (Signed)
See below and advise

## 2016-04-15 ENCOUNTER — Ambulatory Visit (AMBULATORY_SURGERY_CENTER): Payer: Medicare Other | Admitting: Gastroenterology

## 2016-04-15 ENCOUNTER — Encounter: Payer: Self-pay | Admitting: Gastroenterology

## 2016-04-15 VITALS — BP 137/56 | HR 58 | Temp 97.7°F | Resp 14 | Ht 65.0 in | Wt 206.0 lb

## 2016-04-15 DIAGNOSIS — D129 Benign neoplasm of anus and anal canal: Secondary | ICD-10-CM | POA: Diagnosis not present

## 2016-04-15 DIAGNOSIS — K529 Noninfective gastroenteritis and colitis, unspecified: Secondary | ICD-10-CM | POA: Diagnosis not present

## 2016-04-15 DIAGNOSIS — D128 Benign neoplasm of rectum: Secondary | ICD-10-CM | POA: Diagnosis not present

## 2016-04-15 DIAGNOSIS — D122 Benign neoplasm of ascending colon: Secondary | ICD-10-CM | POA: Diagnosis not present

## 2016-04-15 DIAGNOSIS — D127 Benign neoplasm of rectosigmoid junction: Secondary | ICD-10-CM

## 2016-04-15 DIAGNOSIS — K635 Polyp of colon: Secondary | ICD-10-CM

## 2016-04-15 DIAGNOSIS — D120 Benign neoplasm of cecum: Secondary | ICD-10-CM

## 2016-04-15 DIAGNOSIS — Z6833 Body mass index (BMI) 33.0-33.9, adult: Secondary | ICD-10-CM | POA: Diagnosis not present

## 2016-04-15 DIAGNOSIS — D123 Benign neoplasm of transverse colon: Secondary | ICD-10-CM | POA: Diagnosis not present

## 2016-04-15 DIAGNOSIS — D125 Benign neoplasm of sigmoid colon: Secondary | ICD-10-CM | POA: Diagnosis not present

## 2016-04-15 DIAGNOSIS — F329 Major depressive disorder, single episode, unspecified: Secondary | ICD-10-CM | POA: Diagnosis not present

## 2016-04-15 DIAGNOSIS — I10 Essential (primary) hypertension: Secondary | ICD-10-CM | POA: Diagnosis not present

## 2016-04-15 MED ORDER — SODIUM CHLORIDE 0.9 % IV SOLN: 500.0000 mL | INTRAVENOUS | Status: DC

## 2016-04-15 NOTE — Progress Notes (Signed)
Called to room to assist during endoscopic procedure.  Patient ID and intended procedure confirmed with present staff. Received instructions for my participation in the procedure from the performing physician.  

## 2016-04-15 NOTE — Op Note (Signed)
[facility] Patient Name: Tonya Bartlett Procedure Date: 04/15/2016 7:52 AM MRN: [id number] Endoscopist: Remo Lipps P. Lylianna Fraiser MD, MD Age: 71 Referring MD:  Date of Birth: July 23, 1945 Gender: Female Account #: [id number] Procedure:                Colonoscopy Indications:              Chronic diarrhea, Abnormal CT of the GI tract and                            colonoscopy last summer concerning for ischemic                            versus infectious colitis, polyps noted on that                            exam but not removed Medicines:                Monitored Anesthesia Care Procedure:                Pre-Anesthesia Assessment:                           - Prior to the procedure, a History and Physical                            was performed, and patient medications and                            allergies were reviewed. The patient's tolerance of                            previous anesthesia was also reviewed. The risks                            and benefits of the procedure and the sedation                            options and risks were discussed with the patient.                            All questions were answered, and informed consent                            was obtained. Prior Anticoagulants: The patient has                            taken no previous anticoagulant or antiplatelet                            agents. ASA Grade Assessment: II - A patient with                            mild systemic disease. After reviewing the risks  and benefits, the patient was deemed in                            satisfactory condition to undergo the procedure.                           After obtaining informed consent, the colonoscope                            was passed under direct vision. Throughout the                            procedure, the patient's blood pressure, pulse, and                            oxygen saturations were monitored  continuously. The                            Colonoscope was introduced through the anus and                            advanced to the the terminal ileum, with                            identification of the appendiceal orifice and IC                            valve. The colonoscopy was performed without                            difficulty. The patient tolerated the procedure                            well. The quality of the bowel preparation was                            good. The terminal ileum, ileocecal valve,                            appendiceal orifice, and rectum were photographed. Scope In: 7:59:28 AM Scope Out: 8:26:37 AM Scope Withdrawal Time: 0 hours 19 minutes 59 seconds  Total Procedure Duration: 0 hours 27 minutes 9 seconds  Findings:                 The perianal and digital rectal examinations were                            normal.                           Three sessile polyps were found in the cecum. The                            polyps were 3 to 7 mm in size. These polyps were  removed with a cold snare. Resection and retrieval                            were complete.                           Two sessile polyps were found in the ascending                            colon. The polyps were 4 to 5 mm in size. These                            polyps were removed with a cold snare. Resection                            and retrieval were complete.                           A 5 mm polyp was found in the transverse colon. The                            polyp was sessile. The polyp was removed with a                            cold snare. Resection and retrieval were complete.                           A 5 mm polyp was found in the sigmoid colon. The                            polyp was sessile. The polyp was removed with a                            cold snare. Resection and retrieval were complete.                           Two sessile  polyps were found in the recto-sigmoid                            colon. The polyps were 4 to 5 mm in size. These                            polyps were removed with a cold snare. Resection                            and retrieval were complete.                           A 4 mm polyp was found in the rectum. The polyp was                            sessile. The polyp was removed with a  cold snare.                            Resection and retrieval were complete.                           Many small and large-mouthed diverticula were found                            in the transverse colon and left colon.                           The terminal ileum appeared normal.                           Internal hemorrhoids were found during retroflexion.                           The exam was otherwise without abnormality.                           Biopsies for histology were taken with a cold                            forceps from the right colon, left colon and                            transverse colon for evaluation of microscopic                            colitis. Complications:            No immediate complications. Estimated blood loss:                            Minimal. Estimated Blood Loss:     Estimated blood loss was minimal. Impression:               - Three 3 to 7 mm polyps in the cecum, removed with                            a cold snare. Resected and retrieved.                           - Two 4 to 5 mm polyps in the ascending colon,                            removed with a cold snare. Resected and retrieved.                           - One 5 mm polyp in the transverse colon, removed                            with a cold snare. Resected and retrieved.                           -  One 5 mm polyp in the sigmoid colon, removed with                            a cold snare. Resected and retrieved.                           - Two 4 to 5 mm polyps at the recto-sigmoid colon,                             removed with a cold snare. Resected and retrieved.                           - One 4 mm polyp in the rectum, removed with a cold                            snare. Resected and retrieved.                           - Diverticulosis in the transverse colon and in the                            left colon.                           - The examined portion of the ileum was normal.                           - Internal hemorrhoids.                           - The examination was otherwise normal.                           - Biopsies were taken with a cold forceps from the                            right colon, left colon and transverse colon for                            evaluation of microscopic colitis. Recommendation:           - Patient has a contact number available for                            emergencies. The signs and symptoms of potential                            delayed complications were discussed with the                            patient. Return to normal activities tomorrow.                            Written discharge instructions were provided to the  patient.                           - Resume previous diet.                           - Continue present medications.                           - No ibuprofen, naproxen, or other non-steroidal                            anti-inflammatory drugs for 2 weeks after polyp                            removal.                           - Await pathology results.                           - Repeat colonoscopy is recommended for                            surveillance. The colonoscopy date will be                            determined after pathology results from today's                            exam become available for review. Remo Lipps P. Nasser Ku MD, MD 04/15/2016 8:33:22 AM This report has been signed electronically.

## 2016-04-15 NOTE — Patient Instructions (Signed)
YOU HAD AN ENDOSCOPIC PROCEDURE TODAY AT [facility]:   Refer to the procedure report that was given to you for any specific questions about what was found during the examination.  If the procedure report does not answer your questions, please call your gastroenterologist to clarify.  If you requested that your care partner not be given the details of your procedure findings, then the procedure report has been included in a sealed envelope for you to review at your convenience later.  YOU SHOULD EXPECT: Some feelings of bloating in the abdomen. Passage of more gas than usual.  Walking can help get rid of the air that was put into your GI tract during the procedure and reduce the bloating. If you had a lower endoscopy (such as a colonoscopy or flexible sigmoidoscopy) you may notice spotting of blood in your stool or on the toilet paper. If you underwent a bowel prep for your procedure, you may not have a normal bowel movement for a few days.  Please Note:  You might notice some irritation and congestion in your nose or some drainage.  This is from the oxygen used during your procedure.  There is no need for concern and it should clear up in a day or so.  SYMPTOMS TO REPORT IMMEDIATELY:   Following lower endoscopy (colonoscopy or flexible sigmoidoscopy):  Excessive amounts of blood in the stool  Significant tenderness or worsening of abdominal pains  Swelling of the abdomen that is new, acute  Fever of 100F or higher   For urgent or emergent issues, a gastroenterologist can be reached at any hour by calling [phone].   DIET:  We do recommend a small meal at first, but then you may proceed to your regular diet.  Drink plenty of fluids but you should avoid alcoholic beverages for 24 hours.  ACTIVITY:  You should plan to take it easy for the rest of today and you should NOT DRIVE or use heavy machinery until tomorrow (because of the sedation medicines used during the test).     FOLLOW UP: Our staff will call the number listed on your records the next business day following your procedure to check on you and address any questions or concerns that you may have regarding the information given to you following your procedure. If we do not reach you, we will leave a message.  However, if you are feeling well and you are not experiencing any problems, there is no need to return our call.  We will assume that you have returned to your regular daily activities without incident.  If any biopsies were taken you will be contacted by phone or by letter within the next 1-3 weeks.  Please call us at [phone] if you have not heard about the biopsies in 3 weeks.   Hemorrhoids (handout given) Diverticulosis (handout given) Polyps (handout given) Await for biopsy results to determined next repeat Colonoscopy No Ibuprofen,Naproxen, or other non-steriodal anti-inflammatory drugs for 2 weeks after polyps removal   SIGNATURES/CONFIDENTIALITY: You and/or your care partner have signed paperwork which will be entered into your electronic medical record.  These signatures attest to the fact that that the information above on your After Visit Summary has been reviewed and is understood.  Full responsibility of the confidentiality of this discharge information lies with you and/or your care-partner.

## 2016-04-15 NOTE — Progress Notes (Signed)
Tol procedure well. VSS, Report to RN?

## 2016-04-16 ENCOUNTER — Telehealth: Payer: Self-pay | Admitting: *Deleted

## 2016-04-16 NOTE — Telephone Encounter (Signed)
  Follow up Call-  Call back number 04/15/2016 09/26/2015  Post procedure Call Back phone  # [phone]  Permission to leave phone message Yes Yes  Some recent data might be hidden     Patient questions:  Do you have a fever, pain , or abdominal swelling? No. Pain Score  0 *  Have you tolerated food without any problems? Yes.    Have you been able to return to your normal activities? Yes.    Do you have any questions about your discharge instructions: Diet   No. Medications  No. Follow up visit  No.  Do you have questions or concerns about your Care? No.  Actions: * If pain score is 4 or above: No action needed, pain <4.

## 2016-04-20 ENCOUNTER — Ambulatory Visit (INDEPENDENT_AMBULATORY_CARE_PROVIDER_SITE_OTHER): Payer: Medicare Other | Admitting: Family Medicine

## 2016-04-20 ENCOUNTER — Telehealth: Payer: Self-pay | Admitting: Gastroenterology

## 2016-04-20 ENCOUNTER — Encounter: Payer: Self-pay | Admitting: Family Medicine

## 2016-04-20 VITALS — BP 146/92 | HR 56 | Temp 98.4°F | Ht 65.0 in | Wt 211.0 lb

## 2016-04-20 DIAGNOSIS — R42 Dizziness and giddiness: Secondary | ICD-10-CM

## 2016-04-20 DIAGNOSIS — R309 Painful micturition, unspecified: Secondary | ICD-10-CM

## 2016-04-20 DIAGNOSIS — N3000 Acute cystitis without hematuria: Secondary | ICD-10-CM

## 2016-04-20 LAB — POC URINALSYSI DIPSTICK (AUTOMATED)
Bilirubin, UA: NEGATIVE
Glucose, UA: NEGATIVE
Ketones, UA: NEGATIVE
NITRITE UA: NEGATIVE
PH UA: 7
Protein, UA: NEGATIVE
RBC UA: NEGATIVE
Spec Grav, UA: 1.015
UROBILINOGEN UA: 0.2

## 2016-04-20 MED ORDER — CIPROFLOXACIN HCL 500 MG PO TABS: 500.0000 mg | ORAL_TABLET | Freq: Two times a day (BID) | ORAL | 0 refills | Status: DC

## 2016-04-20 MED ORDER — PHENAZOPYRIDINE HCL 200 MG PO TABS: 200.0000 mg | ORAL_TABLET | Freq: Four times a day (QID) | ORAL | 0 refills | Status: DC | PRN

## 2016-04-20 MED ORDER — MECLIZINE HCL 25 MG PO TABS: 25.0000 mg | ORAL_TABLET | ORAL | 0 refills | Status: DC | PRN

## 2016-04-20 NOTE — Telephone Encounter (Signed)
Spoke to patient, she states that on Friday 3/2, started having vertigo and ureathral burning upon urination, now burning all the time. She took pyridium from a prior UTI, but ran out. Denies fever, no changes in bm's, denies abdominal pain. She questioned whether this was related to colonoscopy from 2/28. I told her that I would send this information to Dr. Havery Moros to find out, but most likely not related. She had also scheduled an appointment to see her PCP today for these issues.

## 2016-04-20 NOTE — Telephone Encounter (Signed)
I think it is unlikely related to her colonoscopy, she needs to be evaluated for possible UTI, agree with her seeing her PCP. Thanks

## 2016-04-20 NOTE — Patient Instructions (Signed)
WE NOW OFFER   [facility] FAST TRACK!!!  SAME DAY Appointments for ACUTE CARE  Such as: Sprains, Injuries, cuts, abrasions, rashes, muscle pain, joint pain, back pain Colds, flu, sore throats, headache, allergies, cough, fever  Ear pain, sinus and eye infections Abdominal pain, nausea, vomiting, diarrhea, upset stomach Animal/insect bites  3 Easy Ways to Schedule: Walk-In Scheduling Call in scheduling Mychart Sign-up: [email or web address]         

## 2016-04-20 NOTE — Progress Notes (Signed)
   Subjective:    Patient ID: Tonya Bartlett, female    DOB: 06-06-1945, 71 y.o.   MRN: [id number]  HPI Here for several issues that began at almost the same time. Last Wednesday she had a colonoscopy, then 2 days ago she developed urinary urgency and burning. No fever or back pain. Her last UTI was some years ago. Also 2 days ago she developed intermittent dizziness which she describes as the room spinning. This makes it hard to walk at times.  This starts when she turns her head quickly or when she stands up. No headache and no other neurologic deficits.    Review of Systems  Constitutional: Negative.   HENT: Negative.   Eyes: Negative.   Respiratory: Negative.   Cardiovascular: Negative.   Gastrointestinal: Negative.   Genitourinary: Positive for dysuria, frequency and urgency. Negative for flank pain and hematuria.  Neurological: Positive for dizziness. Negative for tremors, seizures, syncope, facial asymmetry, speech difficulty, weakness, light-headedness, numbness and headaches.       Objective:   Physical Exam  Constitutional: She is oriented to person, place, and time.  She appears normal but she gets dizzy when she moves quickly.   HENT:  Head: Normocephalic and atraumatic.  Right Ear: External ear normal.  Left Ear: External ear normal.  Nose: Nose normal.  Mouth/Throat: Oropharynx is clear and moist.  Eyes: Conjunctivae and EOM are normal. Pupils are equal, round, and reactive to light.  Neck: Neck supple. No thyromegaly present.  Cardiovascular: Normal rate, regular rhythm, normal heart sounds and intact distal pulses.   Pulmonary/Chest: Effort normal and breath sounds normal.  Abdominal: Soft. Bowel sounds are normal. She exhibits no distension. There is no tenderness. There is no rebound and no guarding.  Lymphadenopathy:    She has no cervical adenopathy.  Neurological: She is alert and oriented to person, place, and time. No cranial nerve deficit. She exhibits  normal muscle tone. Coordination normal.          Assessment & Plan:  She has a UTI and I encouraged her to drink plenty of fluids. Start on Cipro. Use Pyridium prn. Culture the urine sample. She also has vertigo, and drinking fluids will help this as well. Use Meclizine prn.  Alysia Penna, MD

## 2016-04-20 NOTE — Progress Notes (Signed)
Pre visit review using our clinic review tool, if applicable. No additional management support is needed unless otherwise documented below in the visit note. 

## 2016-04-22 ENCOUNTER — Telehealth: Payer: Self-pay | Admitting: Adult Health

## 2016-04-22 NOTE — Telephone Encounter (Signed)
I am still waiting on the urine culture result. It is growing E coli but the sensitivity list is not ready. That should be ready tomorrow, and with that I will have a better idea about what the best antibiotic would be for her to take. Stay on Cipro until then.

## 2016-04-22 NOTE — Telephone Encounter (Signed)
Pt saw dr fry on 04-20-16 and still having burning and back pain. Pt was given cipro. Please advise

## 2016-04-23 LAB — URINE CULTURE

## 2016-04-24 ENCOUNTER — Encounter: Payer: Self-pay | Admitting: Gastroenterology

## 2016-04-24 ENCOUNTER — Other Ambulatory Visit: Payer: Self-pay

## 2016-04-24 ENCOUNTER — Other Ambulatory Visit: Payer: Self-pay | Admitting: Adult Health

## 2016-04-24 ENCOUNTER — Telehealth: Payer: Self-pay | Admitting: Adult Health

## 2016-04-24 MED ORDER — NITROFURANTOIN MONOHYD MACRO 100 MG PO CAPS: 100.0000 mg | ORAL_CAPSULE | Freq: Two times a day (BID) | ORAL | 0 refills | Status: DC

## 2016-04-24 NOTE — Telephone Encounter (Signed)
Pt state that she is still burning and has been taking her CIPRO and the whole box of PYRIDIUM every 6 hrs and relieve for about 2 hrs and then the burning comes right back and would like to be referred to a urologist.

## 2016-04-24 NOTE — Telephone Encounter (Signed)
Pt wants to make sure we have her Pharmacy as the [facility] on [address].  Her last Script was sent to [place] where she had some Tamiflu sent.

## 2016-04-24 NOTE — Telephone Encounter (Signed)
I tried to reach pt by phone, no answer. I did send this message to pt in my chart.

## 2016-04-24 NOTE — Telephone Encounter (Signed)
Nothing further needed. Thanks!

## 2016-04-24 NOTE — Telephone Encounter (Signed)
Spoke to patient and informed her that the antibiotic that she was placed on, she was resistant, as she is to most antibiotics.   She is susceptible to Macrobid. & day course called into pharmacy

## 2016-04-24 NOTE — Telephone Encounter (Signed)
Pt state that she does not use MyChart

## 2016-04-24 NOTE — Telephone Encounter (Signed)
Please advise 

## 2016-04-24 NOTE — Telephone Encounter (Signed)
Pharmacy in [place] has been removed from patients chart.

## 2016-05-05 ENCOUNTER — Telehealth: Payer: Self-pay | Admitting: Adult Health

## 2016-05-05 NOTE — Telephone Encounter (Signed)
Pt states she thinks something else is going on and would like to see an urologist asap.

## 2016-05-05 NOTE — Telephone Encounter (Signed)
Pt would like to have a referral to a Urologist due to her burning and back pain has come back and she would like to see what is going on.

## 2016-05-05 NOTE — Telephone Encounter (Signed)
Please advise 

## 2016-05-06 ENCOUNTER — Other Ambulatory Visit: Payer: Self-pay | Admitting: Adult Health

## 2016-05-06 DIAGNOSIS — N390 Urinary tract infection, site not specified: Secondary | ICD-10-CM

## 2016-05-06 NOTE — Telephone Encounter (Signed)
Sent in referral

## 2016-05-07 ENCOUNTER — Other Ambulatory Visit: Payer: Self-pay | Admitting: Adult Health

## 2016-05-07 DIAGNOSIS — I10 Essential (primary) hypertension: Secondary | ICD-10-CM

## 2016-05-28 DIAGNOSIS — M1612 Unilateral primary osteoarthritis, left hip: Secondary | ICD-10-CM | POA: Diagnosis not present

## 2016-05-28 DIAGNOSIS — M25551 Pain in right hip: Secondary | ICD-10-CM | POA: Diagnosis not present

## 2016-05-28 DIAGNOSIS — M25552 Pain in left hip: Secondary | ICD-10-CM | POA: Diagnosis not present

## 2016-06-08 ENCOUNTER — Ambulatory Visit (INDEPENDENT_AMBULATORY_CARE_PROVIDER_SITE_OTHER): Payer: Medicare Other | Admitting: Neurology

## 2016-06-08 ENCOUNTER — Encounter: Payer: Self-pay | Admitting: Neurology

## 2016-06-08 VITALS — BP 159/84 | HR 57 | Wt 217.5 lb

## 2016-06-08 DIAGNOSIS — G3184 Mild cognitive impairment, so stated: Secondary | ICD-10-CM

## 2016-06-08 NOTE — Progress Notes (Signed)
[facility] [address] [place]. [place] [address] [phone]       OFFICE FOLLOW UP VISIT NOTE  Tonya. Tonya Bartlett Date of Birth:  04/05/1945 Medical Record Number:  [id number]   Referring MD:  [name], NP  Reason for Referral:  Memory loss HPI: Initial Consult 09/19/15 :  Tonya Bartlett is a 11 year Caucasian lady who for the last year and a half has been having mild memory and cognitive difficulties. She often misplaces objects but can remember later. She is also having word finding issues and trouble getting her words out. At times she has had trouble driving to places that she has driven down  To numerous times before and yet for a few seconds cannot remember where it done. She is also forgetting names of the patient's. 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 There is no history of strokes seizures significant head injury with loss of consciousness. There is no family history of Alzheimer's dementia. Patient has not had any brain imaging studies done on November done for reversible causes of cognitive impairment. She denies feeling depressed. Past medical history significant for irritable bowel syndrome and syncopal episodes. Update 12/09/2015 : She returns for follow-up after last visit 2 and half months ago. She continues from mild short-term memory difficulties but these appear to be stable and not getting worse. Patient did not fill the prescription for self: She forgot. She had MRI scan the brain done on 10/09/2015 which I personally reviewed and shows only mild changes of chronic microvascular ischemia without any structural lesions tumor infarcts. Lab work done on 8/ 3/17 showed normal vitamin B12, TSH and RPR. EEG done on 10/24/15 was normal. Patient was interested in participating in the [facility] early  dementia study but she does not have a reliable caregiver to help her participate. She states he is taking Celexa and her depression is well controlled. She has no new complaints today. Update 06/08/2016 : She returns for followup after last visit 6 months ago. She states she continues to have mi and cognitive difficulties which are however unchanged. She has trouble finding words occasionally and completing sentences. She has been taking Cerefolin nac and find it helps.  She does play solitaire but does not participate in any other cognitively challenging activities and states her job is quite taxing and stressful. She has no neurological complaints today.. ROS:   14 system review of systems is positive for  Ear pain, eye Itching and redness, joint pain and swelling, neck pain and stiffness, memory loss, dizziness  and all other systems negative  PMH:  Past Medical History:  Diagnosis Date  . Allergic rhinitis   . Colitis   . Depression   . Diverticulosis   . Frequency of urination   . GERD (gastroesophageal reflux disease)   . 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 History of iron deficiency anemia   . History of migraine headaches   . Hyperlipidemia   . IBS (irritable bowel syndrome)   . Memory loss   . Migraine headache   . OA (osteoarthritis)    RIGHT SHOULDER  . Recovering alcoholic ([place])    SINCE [id number]  . Right rotator cuff tear   . Unspecified essential hypertension     Social  History:  Social History   Social History  . Marital status: Widowed    Spouse name: N/A  . Number of children: N/A  . Years of education: N/A   Occupational History  . Not on file.   Social History Main Topics  . Smoking status: Former Smoker    Packs/day: 0.50    Years: 15.00    Types: Cigarettes    Quit date: 05/15/1985  . Smokeless tobacco: Never Used  . Alcohol use No     Comment: RECOVERING ALCOHOLIC--  QUIT [id number]  . Drug use: No  . Sexual  activity: No   Other Topics Concern  . 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 betamethasone dipropionate 0.05 % lotion Apply topically 2 (two) times daily. 60 mL 4  . citalopram (CELEXA) 40 MG tablet Take 1 tablet (40 mg total) by mouth daily. 90 tablet 3  . losartan (COZAAR) 50 MG tablet TAKE 1 TABLET(50 MG) BY MOUTH DAILY 30 tablet 0  . meclizine (ANTIVERT) 25 MG tablet Take 1 tablet (25 mg total) by mouth every 4 (four) hours as needed for dizziness. 60 tablet 0  . nitrofurantoin, macrocrystal-monohydrate, (MACROBID) 100 MG capsule Take 1 capsule (100 mg total) by mouth 2 (two) times daily. 14 capsule 0  . phenazopyridine (PYRIDIUM) 200 MG tablet Take 1 tablet (200 mg total) by mouth every 6 (six) hours as needed (urinary burning). 60 tablet 0  . SUMAtriptan (IMITREX) 100 MG tablet Take 1 tablet (100 mg total) by mouth every 2 (two) hours as needed for migraine. May repeat in 2 hours if headache persists or recurs. 10 tablet 0  . traZODone (DESYREL) 50 MG tablet Take 1 tablet (50 mg total) by mouth at bedtime as needed for sleep. 90 tablet 1   Current Facility-Administered Medications on File Prior to Visit  Medication Dose Route Frequency Provider Last Rate Last Dose  . 0.9 %  sodium chloride infusion  500 mL Intravenous Continuous Manus Gunning, MD        Allergies:   Allergies  Allergen Reactions  . Penicillins Anaphylaxis  . Erythromycin Other (See Comments)    SEVERE STOMACH CRAMPS  . Morphine And Related Nausea And Vomiting  . Nsaids Other (See Comments)    SEVERE STOMACH CRAMPS **Able to tolerate Tylenol  . Nickel Rash    Physical Exam General: well developed, well nourished elderly Caucasian lady, seated, in no  evident distress Head: head normocephalic and atraumatic.   Neck: supple with no carotid or supraclavicular bruits Cardiovascular: regular rate and rhythm, no murmurs Musculoskeletal: no deformity Skin:  no rash/petichiae Vascular:  Normal pulses all extremities  Neurologic Exam Mental Status: Awake and fully alert. Oriented to place and time. Recent and remote memory intact. Attention span, concentration and fund of knowledge appropriate. Mood and affect appropriate. Mini-Mental status exam scored 30/30 with no deficits. G . Able to name 12 four legged animals. Clock drawing 4/4. Able to copy intersecting pentagons quite well. Cranial Nerves: Fundoscopic exam not done. Pupils equal, briskly reactive to light. Extraocular movements full without nystagmus. Visual fields full to confrontation. Hearing intact. Facial sensation intact. Face, tongue, palate moves normally  and symmetrically.  Motor: Normal bulk and tone. Normal strength in all tested extremity muscles. Sensory.: intact to touch , pinprick , position and vibratory sensation.  Coordination: Rapid alternating movements normal in all extremities. Finger-to-nose and heel-to-shin performed accurately bilaterally. Gait and Station: Arises from chair without difficulty. Stance is normal. Gait demonstrates normal stride length and balance . Able to heel, toe and tandem walk without difficulty.  Reflexes: 1+ and symmetric. Toes downgoing.       ASSESSMENT: 79 year Caucasian lady with mild memory and cognitive difficulties likely due to mild cognitive impairment.    PLAN:  I had a long discussion with the patient regarding her mild memory loss and cognitive impairment which appears to be stable. Continue Cerefolin NAC daily as well as increase participation in cognitively challenging activities like solving crossword puzzles, word searches, sudoku and playing bridge. She will return for follow-up in a year or call earlier if necessary .Marland Kitchen  Greater than 50% time during this 25 minute  visit was spent on counseling and coordination of care about her memory loss and cognitive impairment.   Antony Contras, MD  [facility] [address] [place], [place] [address]  Phone [phone] Fax [phone] Note: This document was prepared with digital dictation and possible smart phrase technology. Any transcriptional errors that result from this process are unintentional.

## 2016-06-08 NOTE — Patient Instructions (Signed)
I had a long discussion with the patient regarding her mild memory loss and cognitive impairment which appears to be stable. Continue Cerefolin NAC daily as well as increase participation in cognitively challenging activities like solving crossword puzzles, word searches, sudoku and playing bridge. She will return for follow-up in a year or call earlier if necessary

## 2016-06-09 ENCOUNTER — Other Ambulatory Visit: Payer: Self-pay | Admitting: Adult Health

## 2016-06-09 DIAGNOSIS — I10 Essential (primary) hypertension: Secondary | ICD-10-CM

## 2016-06-11 ENCOUNTER — Emergency Department (HOSPITAL_BASED_OUTPATIENT_CLINIC_OR_DEPARTMENT_OTHER)
Admission: EM | Admit: 2016-06-11 | Discharge: 2016-06-11 | Disposition: A | Discharge status: Home / Self Care | Payer: Medicare Other | Attending: Emergency Medicine | Admitting: Emergency Medicine

## 2016-06-11 ENCOUNTER — Encounter (HOSPITAL_BASED_OUTPATIENT_CLINIC_OR_DEPARTMENT_OTHER): Payer: Self-pay | Admitting: *Deleted

## 2016-06-11 DIAGNOSIS — Y929 Unspecified place or not applicable: Secondary | ICD-10-CM | POA: Insufficient documentation

## 2016-06-11 DIAGNOSIS — Y999 Unspecified external cause status: Secondary | ICD-10-CM | POA: Diagnosis not present

## 2016-06-11 DIAGNOSIS — Z87891 Personal history of nicotine dependence: Secondary | ICD-10-CM | POA: Insufficient documentation

## 2016-06-11 DIAGNOSIS — S40862A Insect bite (nonvenomous) of left upper arm, initial encounter: Secondary | ICD-10-CM | POA: Diagnosis not present

## 2016-06-11 DIAGNOSIS — I10 Essential (primary) hypertension: Secondary | ICD-10-CM | POA: Insufficient documentation

## 2016-06-11 DIAGNOSIS — W57XXXA Bitten or stung by nonvenomous insect and other nonvenomous arthropods, initial encounter: Secondary | ICD-10-CM | POA: Insufficient documentation

## 2016-06-11 DIAGNOSIS — Y939 Activity, unspecified: Secondary | ICD-10-CM | POA: Insufficient documentation

## 2016-06-11 MED ORDER — TRIAMCINOLONE ACETONIDE 0.025 % EX OINT: 1.0000 "application " | TOPICAL_OINTMENT | Freq: Two times a day (BID) | CUTANEOUS | 1 refills | Status: DC

## 2016-06-11 MED ORDER — DOXYCYCLINE HYCLATE 100 MG PO CAPS: 100.0000 mg | ORAL_CAPSULE | Freq: Two times a day (BID) | ORAL | 0 refills | Status: DC

## 2016-06-11 NOTE — ED Provider Notes (Signed)
[facility] Provider Note   CSN: [id number] Arrival date & time: 06/11/16  2015  By signing my name below, I, Jeanell Sparrow, attest that this documentation has been prepared under the direction and in the presence of non-physician practitioner, Margarita Mail, PA-C. Electronically Signed: Jeanell Sparrow, Scribe. 06/11/2016. 10:49 PM.  History   Chief Complaint Chief Complaint  Patient presents with  . Insect Bite   The history is provided by the patient. No language interpreter was used.   HPI Comments: Tonya Bartlett is a 71 y.o. female who presents to the Emergency Department complaining of left axilla tick bite that occurred 4 days ago. She states she attempted to remove the tick PTA with no relief. She reports associated redness. Denies any other complaints at this time.    PCP: [name], NP  Past Medical History:  Diagnosis Date  . Allergic rhinitis   . Colitis   . Depression   . Diverticulosis   . Frequency of urination   . GERD (gastroesophageal reflux disease)   . 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 History of iron deficiency anemia   . History of migraine headaches   . Hyperlipidemia   . IBS (irritable bowel syndrome)   . Memory loss   . Migraine headache   . OA (osteoarthritis)    RIGHT SHOULDER  . Recovering alcoholic ([place])    SINCE [id number]  . Right rotator cuff tear   . Unspecified essential hypertension     Patient Active Problem List   Diagnosis Date Noted  . MCI (mild cognitive impairment) 09/19/2015  . Memory deficit 09/19/2015  . Psoriasis of scalp 11/06/2014  . Left knee pain 11/06/2014  . Sepsis ([place]) 10/29/2014  . SIRS (systemic inflammatory response syndrome) ([place]) 10/29/2014  . Fever 10/29/2014  . Joint pain 10/29/2014  . Serum sickness 10/29/2014  . Arthralgia   . 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 Depression (emotion) 11/16/2013  . S/P arthroscopy of shoulder  05/23/2013  . ADD (attention deficit disorder) 11/17/2011  . Essential hypertension, benign 10/15/2009  . SEBORRHEIC KERATOSIS, INFLAMED 10/17/2008  . CARPAL TUNNEL SYNDROME, LEFT 06/22/2008  . Allergic rhinitis 08/30/2007  . Severe obesity (BMI >= 40) ([place]) 03/31/2007  . Hyperlipidemia 11/11/2006  . Migraine 11/11/2006  . Osteoarthritis 11/11/2006    Past Surgical History:  Procedure Laterality Date  . BUNIONECTOMY/  HAMMERTOE CORRECTION  RIGHT FOOT  2011  . CATARACT EXTRACTION W/ INTRAOCULAR LENS  IMPLANT, BILATERAL    . KNEE ARTHROSCOPY W/ MENISCECTOMY Bilateral X2  LEFT /    X1  RIGHT  . REPLACEMENT TOTAL KNEE Left 2006  . SHOULDER ARTHROSCOPY WITH SUBACROMIAL DECOMPRESSION, ROTATOR CUFF REPAIR AND BICEP TENDON REPAIR Right 05/23/2013   Procedure: RIGHT SHOULDER ARTHROSCOPY EXAM UNDER ANESTHESIA  WITH SUBACROMIAL DECOMPRESSION,DISTAL CLAVICLE RESECTION, SADLABRAL DEBRIDEMENT CHONDROPLASTY, BICEP TENOTOMY ;  Surgeon: Sydnee Cabal, MD;  Location: [facility];  Service: Orthopedics;  Laterality: Right;  . TONSILLECTOMY AND ADENOIDECTOMY  AGE 39  . TOTAL KNEE ARTHROPLASTY Bilateral LEFT  1996/   RIGHT 2004  . VAGINAL HYSTERECTOMY  1976    OB History    No data available       Home Medications    Prior to Admission medications   Medication Sig Start Date End Date Taking? Authorizing Provider  betamethasone dipropionate 0.05 % lotion Apply topically 2 (two) times daily. 08/30/15   [name], NP  citalopram (CELEXA) 40 MG tablet Take 1 tablet (40 mg total) by mouth daily. 08/30/15   [name], NP  doxycycline (VIBRAMYCIN) 100 MG capsule Take 1 capsule (100 mg total) by mouth 2 (two) times daily. 06/11/16   Margarita Mail, PA-C  L-Methylfolate-B12-B6-B2 (CEREFOLIN PO) Take by mouth.    Historical Provider, MD  losartan (COZAAR) 50 MG tablet TAKE 1 TABLET(50 MG) BY MOUTH DAILY 06/09/16   [name], NP  meclizine (ANTIVERT) 25 MG tablet Take 1 tablet (25 mg  total) by mouth every 4 (four) hours as needed for dizziness. 04/20/16   Laurey Morale, MD  nitrofurantoin, macrocrystal-monohydrate, (MACROBID) 100 MG capsule Take 1 capsule (100 mg total) by mouth 2 (two) times daily. 04/24/16   [name], NP  phenazopyridine (PYRIDIUM) 200 MG tablet Take 1 tablet (200 mg total) by mouth every 6 (six) hours as needed (urinary burning). 04/20/16   Laurey Morale, MD  SUMAtriptan (IMITREX) 100 MG tablet Take 1 tablet (100 mg total) by mouth every 2 (two) hours as needed for migraine. May repeat in 2 hours if headache persists or recurs. 04/01/16   [name], NP  traZODone (DESYREL) 50 MG tablet Take 1 tablet (50 mg total) by mouth at bedtime as needed for sleep. 12/12/15   [name], NP  triamcinolone (KENALOG) 0.025 % ointment Apply 1 application topically 2 (two) times daily. Do not apply to face 06/11/16   Margarita Mail, PA-C    Family History Family History  Problem Relation Age of Onset  . Heart disease Father 33  . Hypertension Father   . Melanoma Mother   . Cancer Sister     skin CA, 2 sisters  . Esophageal cancer Brother   . Dementia Paternal Grandfather     Social History Social History  Substance Use Topics  . Smoking status: Former Smoker    Packs/day: 0.50    Years: 15.00    Types: Cigarettes    Quit date: 05/15/1985  . Smokeless tobacco: Never Used  . Alcohol use No     Comment: RECOVERING ALCOHOLIC--  QUIT [id number]     Allergies   Penicillins; Erythromycin; Morphine and related; Nsaids; and Nickel   Review of Systems Review of Systems  Constitutional: Negative for fever.  Respiratory: Negative for shortness of breath.   Skin: Positive for color change.       +Tick bite (left axilla)     Physical Exam Updated Vital Signs BP (!) 155/69   Pulse 64   Temp 98.5 F (36.9 C)   Resp 16   Ht 5' 6.5" (1.689 m)   Wt 212 lb (96.2 kg)   SpO2 100%   BMI 33.71 kg/m   Physical Exam  Constitutional: She appears  well-developed and well-nourished. No distress.  HENT:  Head: Normocephalic.  Eyes: Conjunctivae are normal.  Neck: Neck supple.  Cardiovascular: Normal rate and regular rhythm.   Pulmonary/Chest: Effort normal.  Abdominal: Soft.  Musculoskeletal: Normal range of motion.  Neurological: She is alert.  Skin: Skin is warm and dry.  Left axilla with area of dark where she was bitten. Surrounding erythema and excoriations.   Psychiatric: She has a normal mood and affect.  Nursing note and vitals reviewed.    ED Treatments / Results  DIAGNOSTIC STUDIES: Oxygen Saturation is 100% on RA, normal by my interpretation.    COORDINATION OF CARE: 10:53 PM- Pt advised of plan for treatment and pt agrees.  Labs (all labs ordered are listed, but  only abnormal results are displayed) Labs Reviewed - No data to display  EKG  EKG Interpretation None       Radiology No results found.  Procedures Procedures (including critical care time)  Medications Ordered in ED Medications - No data to display   Initial Impression / Assessment and Plan / ED Course  I have reviewed the triage vital signs and the nursing notes.  Pertinent labs & imaging results that were available during my care of the patient were reviewed by me and considered in my medical decision making (see chart for details).     Patient with tick bite to the left axilla. She has a component of allergic reaction with itching and swelling around the bite site. There is also erythema and swelling suggestive of some infection. Patient will be treated with doxycycline. She should have the axilla rechecked by her PCP on Monday. I discussed return precautions with the patient. She appears safe for discharge at this time  Final Clinical Impressions(s) / ED Diagnoses   Final diagnoses:  Tick bite, initial encounter    New Prescriptions New Prescriptions   DOXYCYCLINE (VIBRAMYCIN) 100 MG CAPSULE    Take 1 capsule (100 mg total)  by mouth 2 (two) times daily.   TRIAMCINOLONE (KENALOG) 0.025 % OINTMENT    Apply 1 application topically 2 (two) times daily. Do not apply to face   I personally performed the services described in this documentation, which was scribed in my presence. The recorded information has been reviewed and is accurate.        Margarita Mail, PA-C 06/12/16 [place], MD 06/12/16 [phone]

## 2016-06-11 NOTE — ED Triage Notes (Signed)
Pt  c/o tick bite to left axilla x  4 days ago c/o redness

## 2016-06-11 NOTE — Discharge Instructions (Signed)
Contact a health care provider if: °You have symptoms of a disease after a tick bite. Symptoms of a tick-borne disease can occur from moments after the tick bites to up to 30 days after a tick is removed. Symptoms include: °Muscle, joint, or bone pain. °Difficulty walking or moving your legs. °Numbness in the legs. °Paralysis. °Red rash around the tick bite area that is shaped like a target or a "bull's-eye." °Redness and swelling in the area of the tick bite. °Fever. °Repeated vomiting. °Diarrhea. °Weight loss. °Tender, swollen lymph glands. °Shortness of breath. °Cough. °Pain in the abdomen. °Headache. °Abnormal tiredness. °A change in your level of consciousness. °Confusion. °Get help right away if: °You are not able to remove a tick. °A part of a tick breaks off and gets stuck in your skin. °Your symptoms get worse. °

## 2016-06-23 DIAGNOSIS — R300 Dysuria: Secondary | ICD-10-CM | POA: Diagnosis not present

## 2016-06-23 DIAGNOSIS — N3020 Other chronic cystitis without hematuria: Secondary | ICD-10-CM | POA: Diagnosis not present

## 2016-07-02 DIAGNOSIS — M5030 Other cervical disc degeneration, unspecified cervical region: Secondary | ICD-10-CM | POA: Diagnosis not present

## 2016-07-02 DIAGNOSIS — L401 Generalized pustular psoriasis: Secondary | ICD-10-CM | POA: Diagnosis not present

## 2016-07-02 DIAGNOSIS — L4050 Arthropathic psoriasis, unspecified: Secondary | ICD-10-CM | POA: Diagnosis not present

## 2016-07-02 DIAGNOSIS — M5136 Other intervertebral disc degeneration, lumbar region: Secondary | ICD-10-CM | POA: Diagnosis not present

## 2016-07-02 DIAGNOSIS — M150 Primary generalized (osteo)arthritis: Secondary | ICD-10-CM | POA: Diagnosis not present

## 2016-07-02 DIAGNOSIS — Z6834 Body mass index (BMI) 34.0-34.9, adult: Secondary | ICD-10-CM | POA: Diagnosis not present

## 2016-07-02 DIAGNOSIS — M25511 Pain in right shoulder: Secondary | ICD-10-CM | POA: Diagnosis not present

## 2016-07-02 DIAGNOSIS — M25552 Pain in left hip: Secondary | ICD-10-CM | POA: Diagnosis not present

## 2016-07-02 DIAGNOSIS — E669 Obesity, unspecified: Secondary | ICD-10-CM | POA: Diagnosis not present

## 2016-07-02 DIAGNOSIS — G5622 Lesion of ulnar nerve, left upper limb: Secondary | ICD-10-CM | POA: Diagnosis not present

## 2016-07-09 ENCOUNTER — Other Ambulatory Visit: Payer: Self-pay | Admitting: Adult Health

## 2016-07-09 NOTE — Telephone Encounter (Signed)
Ok to refill 

## 2016-07-14 ENCOUNTER — Telehealth: Payer: Self-pay | Admitting: Adult Health

## 2016-07-14 NOTE — Telephone Encounter (Signed)
This medication was last prescribed by Dr. Sherren Mocha on 03/18/15 for #60, BID, with 0 refills. - is this ok to refill?

## 2016-07-14 NOTE — Telephone Encounter (Signed)
Pt would like new rx ritalin 20 mg #60. Pt has not taken this med in awhile

## 2016-07-15 NOTE — Telephone Encounter (Signed)
I am sorry but I do not prescribe ADHD medications. If she feels like she needs it then I would have her be seen at [facility] or follow up with psychiatry

## 2016-07-15 NOTE — Telephone Encounter (Signed)
I notified patient of Cory's comments and patient verbalized understanding.

## 2016-08-12 ENCOUNTER — Other Ambulatory Visit: Payer: Self-pay | Admitting: Adult Health

## 2016-08-12 DIAGNOSIS — Z760 Encounter for issue of repeat prescription: Secondary | ICD-10-CM

## 2016-09-14 DIAGNOSIS — M5136 Other intervertebral disc degeneration, lumbar region: Secondary | ICD-10-CM | POA: Diagnosis not present

## 2016-09-14 DIAGNOSIS — M150 Primary generalized (osteo)arthritis: Secondary | ICD-10-CM | POA: Diagnosis not present

## 2016-09-14 DIAGNOSIS — M25511 Pain in right shoulder: Secondary | ICD-10-CM | POA: Diagnosis not present

## 2016-09-14 DIAGNOSIS — G5622 Lesion of ulnar nerve, left upper limb: Secondary | ICD-10-CM | POA: Diagnosis not present

## 2016-09-14 DIAGNOSIS — L4050 Arthropathic psoriasis, unspecified: Secondary | ICD-10-CM | POA: Diagnosis not present

## 2016-09-14 DIAGNOSIS — E669 Obesity, unspecified: Secondary | ICD-10-CM | POA: Diagnosis not present

## 2016-09-14 DIAGNOSIS — M25552 Pain in left hip: Secondary | ICD-10-CM | POA: Diagnosis not present

## 2016-09-14 DIAGNOSIS — M5030 Other cervical disc degeneration, unspecified cervical region: Secondary | ICD-10-CM | POA: Diagnosis not present

## 2016-09-14 DIAGNOSIS — L401 Generalized pustular psoriasis: Secondary | ICD-10-CM | POA: Diagnosis not present

## 2016-09-14 DIAGNOSIS — Z6834 Body mass index (BMI) 34.0-34.9, adult: Secondary | ICD-10-CM | POA: Diagnosis not present

## 2016-10-06 ENCOUNTER — Telehealth: Payer: Self-pay | Admitting: Family Medicine

## 2016-10-06 ENCOUNTER — Other Ambulatory Visit: Payer: Self-pay | Admitting: Adult Health

## 2016-10-06 NOTE — Telephone Encounter (Signed)
Sent to the pharmacy by e-scribe for 30 days.  Pt now due for yearly with [facility].  Message sent to scheduling.

## 2016-10-06 NOTE — Telephone Encounter (Signed)
Pt now due for yearly visit with [facility].  Please help her to get on the schedule and come fasting for lab work.  Thanks!!

## 2016-10-07 NOTE — Telephone Encounter (Signed)
Left message on VM to call the office and schedule appt.

## 2016-11-03 ENCOUNTER — Ambulatory Visit: Payer: Medicare Other | Admitting: Adult Health

## 2016-11-03 NOTE — Progress Notes (Deleted)
Subjective:    Patient ID: Tonya Bartlett, female    DOB: 03-20-1945, 71 y.o.   MRN: [id number]  HPI  Patient presents for yearly preventative medicine examination. She is a pleasant 71 year old female who  has a past medical history of Allergic rhinitis; Colitis; Depression; Diverticulosis; Frequency of urination; GERD (gastroesophageal reflux disease); History of DVT of lower extremity; History of iron deficiency anemia; History of migraine headaches; Hyperlipidemia; IBS (irritable bowel syndrome); Memory loss; Migraine headache; OA (osteoarthritis); Recovering alcoholic ([place]); Right rotator cuff tear; and Unspecified essential hypertension.  She takes Celexa 40 mg for depression and feels as though this is well  Controlled.   She takes Cozaar 50 mg for control of hypertension   Due to history of migraines she takes Imitrex when needed   For insomnia she takes Trazodone   She is followed by Neurology for mild congitive impairment. This is stable and for which she takes Cerefolin   All immunizations and health maintenance protocols were reviewed with the patient and needed orders were placed.  Appropriate screening laboratory values were ordered for the patient including screening of hyperlipidemia, renal function and hepatic function.  Medication reconciliation,  past medical history, social history, problem list and allergies were reviewed in detail with the patient  Goals were established with regard to weight loss, exercise, and  diet in compliance with medications  End of life planning was discussed.  She is due for a colonoscopy next year due to history of polyps. She will be due for a mammogram in December 2018 .   Review of Systems  Constitutional: Negative.   HENT: Negative.   Eyes: Negative.   Respiratory: Negative.   Cardiovascular: Negative.   Gastrointestinal: Negative.   Endocrine: Negative.   Genitourinary: Negative.   Musculoskeletal: Negative.   Skin:  Negative.   Allergic/Immunologic: Negative.   Neurological: Negative.   Hematological: Negative.    Past Medical History:  Diagnosis Date  . Allergic rhinitis   . Colitis   . Depression   . Diverticulosis   . Frequency of urination   . GERD (gastroesophageal reflux disease)   . 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 History of iron deficiency anemia   . History of migraine headaches   . Hyperlipidemia   . IBS (irritable bowel syndrome)   . Memory loss   . Migraine headache   . OA (osteoarthritis)    RIGHT SHOULDER  . Recovering alcoholic ([place])    SINCE [id number]  . Right rotator cuff tear   . Unspecified essential hypertension     Social History   Social History  . Marital status: Widowed    Spouse name: N/A  . Number of children: N/A  . Years of education: N/A   Occupational History  . Not on file.   Social History Main Topics  . Smoking status: Former Smoker    Packs/day: 0.50    Years: 15.00    Types: Cigarettes    Quit date: 05/15/1985  . Smokeless tobacco: Never Used  . Alcohol use No     Comment: RECOVERING ALCOHOLIC--  QUIT [id number]  . Drug use: No  . Sexual activity: No   Other Topics Concern  . 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 BUNIONECTOMY/  HAMMERTOE CORRECTION  RIGHT FOOT  2011  . CATARACT EXTRACTION W/ INTRAOCULAR LENS  IMPLANT, BILATERAL    . KNEE ARTHROSCOPY W/ MENISCECTOMY Bilateral X2  LEFT /    X1  RIGHT  . REPLACEMENT TOTAL KNEE Left 2006  . SHOULDER ARTHROSCOPY WITH SUBACROMIAL DECOMPRESSION, ROTATOR CUFF REPAIR AND BICEP TENDON REPAIR Right 05/23/2013   Procedure: RIGHT SHOULDER ARTHROSCOPY EXAM UNDER ANESTHESIA  WITH SUBACROMIAL DECOMPRESSION,DISTAL CLAVICLE RESECTION, SADLABRAL DEBRIDEMENT CHONDROPLASTY,  BICEP TENOTOMY ;  Surgeon: Sydnee Cabal, MD;  Location: [facility];  Service: Orthopedics;  Laterality: Right;  . TONSILLECTOMY AND ADENOIDECTOMY  AGE 29  . TOTAL KNEE ARTHROPLASTY Bilateral LEFT  1996/   RIGHT 2004  . VAGINAL HYSTERECTOMY  1976    Family History  Problem Relation Age of Onset  . Heart disease Father 8  . Hypertension Father   . Melanoma Mother   . Cancer Sister        skin CA, 2 sisters  . Esophageal cancer Brother   . Dementia Paternal Grandfather     Allergies  Allergen Reactions  . Penicillins Anaphylaxis  . Erythromycin Other (See Comments)    SEVERE STOMACH CRAMPS  . Morphine And Related Nausea And Vomiting  . Nsaids Other (See Comments)    SEVERE STOMACH CRAMPS **Able to tolerate Tylenol  . Nickel Rash    Current Outpatient Prescriptions on File Prior to Visit  Medication Sig Dispense Refill  . betamethasone dipropionate 0.05 % lotion Apply topically 2 (two) times daily. 60 mL 4  . citalopram (CELEXA) 40 MG tablet Take 1 tablet (40 mg total) by mouth daily. 90 tablet 3  . doxycycline (VIBRAMYCIN) 100 MG capsule Take 1 capsule (100 mg total) by mouth 2 (two) times daily. 20 capsule 0  . L-Methylfolate-B12-B6-B2 (CEREFOLIN PO) Take by mouth.    . losartan (COZAAR) 50 MG tablet TAKE 1 TABLET(50 MG) BY MOUTH DAILY 90 tablet 2  . meclizine (ANTIVERT) 25 MG tablet Take 1 tablet (25 mg total) by mouth every 4 (four) hours as needed for dizziness. 60 tablet 0  . nitrofurantoin, macrocrystal-monohydrate, (MACROBID) 100 MG capsule Take 1 capsule (100 mg total) by mouth 2 (two) times daily. 14 capsule 0  . phenazopyridine (PYRIDIUM) 200 MG tablet Take 1 tablet (200 mg total) by mouth every 6 (six) hours as needed (urinary burning). 60 tablet 0  . SUMAtriptan (IMITREX) 100 MG tablet Take 1 tablet (100 mg total) by mouth every 2 (two) hours as needed for migraine. May repeat in 2 hours if headache persists or recurs. 10 tablet 0  . traZODone  (DESYREL) 50 MG tablet TAKE 1 TABLET(50 MG) BY MOUTH AT BEDTIME AS NEEDED FOR SLEEP 30 tablet 0  . triamcinolone (KENALOG) 0.025 % ointment Apply 1 application topically 2 (two) times daily. Do not apply to face 15 g 1   Current Facility-Administered Medications on File Prior to Visit  Medication Dose Route Frequency Provider Last Rate Last Dose  . 0.9 %  sodium chloride infusion  500 mL Intravenous Continuous Armbruster, [name], MD        There were no vitals taken for this visit.      Objective:   Physical Exam  Constitutional: She is oriented to person, place, and time. She appears well-developed and well-nourished. No distress.  HENT:  Head: Normocephalic and atraumatic.  Right Ear: External ear normal.  Left Ear: External ear normal.  Nose: Nose normal.  Mouth/Throat: Oropharynx is clear and moist. No oropharyngeal exudate.  Eyes: Conjunctivae and EOM are normal. Right eye exhibits no discharge. Left eye exhibits no discharge. No scleral icterus.  Neck: Normal range of motion. Neck supple. No JVD present. No tracheal deviation present. No thyromegaly present.  Cardiovascular: Normal rate, regular rhythm, normal heart sounds and intact distal pulses.  Exam reveals no gallop and no friction rub.   No murmur heard. Pulmonary/Chest: Effort normal and breath sounds normal. No stridor. No respiratory distress. She has no wheezes. She has no rales. She exhibits no tenderness.  Abdominal: Soft. Bowel sounds are normal. She exhibits no distension and no mass. There is no tenderness. There is no rebound and no guarding.  Musculoskeletal: Normal range of motion. She exhibits no edema, tenderness or deformity.  Lymphadenopathy:    She has no cervical adenopathy.  Neurological: She is alert and oriented to person, place, and time. She displays normal reflexes. No cranial nerve deficit. She exhibits normal muscle tone. Coordination normal.  Skin: Skin is warm and dry. No rash noted. She is  not diaphoretic. No erythema. No pallor.  Psychiatric: She has a normal mood and affect. Her behavior is normal. Judgment and thought content normal.  Nursing note and vitals reviewed.     Assessment & Plan:

## 2016-11-05 ENCOUNTER — Other Ambulatory Visit: Payer: Self-pay | Admitting: Adult Health

## 2016-11-05 ENCOUNTER — Ambulatory Visit (INDEPENDENT_AMBULATORY_CARE_PROVIDER_SITE_OTHER): Payer: Medicare Other | Admitting: Adult Health

## 2016-11-05 ENCOUNTER — Encounter: Payer: Self-pay | Admitting: Adult Health

## 2016-11-05 ENCOUNTER — Other Ambulatory Visit: Payer: Self-pay | Admitting: Family Medicine

## 2016-11-05 VITALS — BP 150/90 | Temp 98.6°F | Ht 65.5 in | Wt 220.0 lb

## 2016-11-05 DIAGNOSIS — Z23 Encounter for immunization: Secondary | ICD-10-CM

## 2016-11-05 DIAGNOSIS — B356 Tinea cruris: Secondary | ICD-10-CM | POA: Diagnosis not present

## 2016-11-05 DIAGNOSIS — Z1159 Encounter for screening for other viral diseases: Secondary | ICD-10-CM

## 2016-11-05 DIAGNOSIS — I10 Essential (primary) hypertension: Secondary | ICD-10-CM | POA: Diagnosis not present

## 2016-11-05 DIAGNOSIS — F339 Major depressive disorder, recurrent, unspecified: Secondary | ICD-10-CM

## 2016-11-05 DIAGNOSIS — E782 Mixed hyperlipidemia: Secondary | ICD-10-CM

## 2016-11-05 DIAGNOSIS — R739 Hyperglycemia, unspecified: Secondary | ICD-10-CM | POA: Diagnosis not present

## 2016-11-05 LAB — HEPATIC FUNCTION PANEL
ALBUMIN: 4 g/dL (ref 3.5–5.2)
ALK PHOS: 60 U/L (ref 39–117)
ALT: 15 U/L (ref 0–35)
AST: 15 U/L (ref 0–37)
BILIRUBIN DIRECT: 0.1 mg/dL (ref 0.0–0.3)
TOTAL PROTEIN: 6.7 g/dL (ref 6.0–8.3)
Total Bilirubin: 0.7 mg/dL (ref 0.2–1.2)

## 2016-11-05 LAB — BASIC METABOLIC PANEL
BUN: 13 mg/dL (ref 6–23)
CALCIUM: 9.1 mg/dL (ref 8.4–10.5)
CO2: 29 meq/L (ref 19–32)
CREATININE: 0.61 mg/dL (ref 0.40–1.20)
Chloride: 105 mEq/L (ref 96–112)
GFR: 102.42 mL/min (ref >60.00)
GLUCOSE: 98 mg/dL (ref 70–99)
Potassium: 3.9 mEq/L (ref 3.5–5.1)
Sodium: 140 mEq/L (ref 135–145)

## 2016-11-05 LAB — CBC WITH DIFFERENTIAL/PLATELET
BASOS PCT: 2 % (ref 0.0–3.0)
Basophils Absolute: 0.1 10*3/uL (ref 0.0–0.1)
EOS ABS: 0.1 10*3/uL (ref 0.0–0.7)
Eosinophils Relative: 2.7 % (ref 0.0–5.0)
HEMATOCRIT: 37.2 % (ref 36.0–46.0)
Hemoglobin: 12.4 g/dL (ref 12.0–15.0)
Lymphocytes Relative: 26.6 % (ref 12.0–46.0)
Lymphs Abs: 1.3 10*3/uL (ref 0.7–4.0)
MCHC: 33.3 g/dL (ref 30.0–36.0)
MCV: 98.4 fl (ref 78.0–100.0)
MONO ABS: 0.4 10*3/uL (ref 0.1–1.0)
Monocytes Relative: 7.4 % (ref 3.0–12.0)
Neutro Abs: 3 10*3/uL (ref 1.4–7.7)
Neutrophils Relative %: 61.3 % (ref 43.0–77.0)
PLATELETS: 294 10*3/uL (ref 150.0–400.0)
RBC: 3.78 Mil/uL — ABNORMAL LOW (ref 3.87–5.11)
RDW: 13.1 % (ref 11.5–15.5)
WBC: 4.9 10*3/uL (ref 4.0–10.5)

## 2016-11-05 LAB — TSH: TSH: 2.12 u[IU]/mL (ref 0.35–4.50)

## 2016-11-05 LAB — LIPID PANEL
CHOL/HDL RATIO: 3
Cholesterol: 236 mg/dL — ABNORMAL HIGH (ref 0–200)
HDL: 73.3 mg/dL (ref >39.00)
LDL Cholesterol: 144 mg/dL — ABNORMAL HIGH (ref 0–99)
NONHDL: 162.46
TRIGLYCERIDES: 92 mg/dL (ref 0.0–149.0)
VLDL: 18.4 mg/dL (ref 0.0–40.0)

## 2016-11-05 LAB — HEMOGLOBIN A1C: HEMOGLOBIN A1C: 5.3 % (ref 4.6–6.5)

## 2016-11-05 MED ORDER — NYSTATIN 100000 UNIT/GM EX POWD: Freq: Three times a day (TID) | CUTANEOUS | 3 refills | Status: DC

## 2016-11-05 MED ORDER — ATORVASTATIN CALCIUM 20 MG PO TABS
20.0000 mg | ORAL_TABLET | Freq: Every day | ORAL | 3 refills | Status: DC
Start: 2016-11-05 — End: 2018-02-01

## 2016-11-05 MED ORDER — SERTRALINE HCL 50 MG PO TABS: 50.0000 mg | ORAL_TABLET | Freq: Every day | ORAL | 3 refills | Status: DC

## 2016-11-05 NOTE — Progress Notes (Signed)
Subjective:    Patient ID: Tonya Bartlett, female    DOB: [birth year], 71 y.o.   MRN: [id number]  HPI Patient presents for yearly follow up exam. She is a pleasant 71 year old female who  has a past medical history of Allergic rhinitis; Colitis; Depression; Diverticulosis; Frequency of urination; GERD (gastroesophageal reflux disease); History of DVT of lower extremity; History of iron deficiency anemia; History of migraine headaches; Hyperlipidemia; IBS (irritable bowel syndrome); Memory loss; Migraine headache; OA (osteoarthritis); Recovering alcoholic ([place]); Right rotator cuff tear; and Unspecified essential hypertension.   She takes Celexa 20 mg for anxiety and depression. She reports that she stopped Celexa 3 weeks ago as she did not feel like it was helping her any longer. She has felt more depressed lately for unknown reasons. She denies any SI. She would like to try another medication  She takes Imitrex as needed for migraines  Her blood pressure is controlled with Cozaar 50 mg daily.   All immunizations and health maintenance protocols were reviewed with the patient and needed orders were placed.  Appropriate screening laboratory values were ordered for the patient including screening of hyperlipidemia, renal function and hepatic function.  Medication reconciliation,  past medical history, social history, problem list and allergies were reviewed in detail with the patient  Goals were established with regard to weight loss, exercise, and  diet in compliance with medications  She is up to date on her colonoscopy and mammogram   She is followed by Neurology for mild cognitive impairment   Her only acute complaint is that of a red painful rash with a yeasty smell on bilateral sides of groin. She denies any vaginal discharge   Review of Systems  Constitutional: Negative.   HENT: Negative.   Eyes: Negative.   Respiratory: Negative.   Cardiovascular: Negative.   Gastrointestinal:  Negative.   Endocrine: Negative.   Genitourinary: Negative.        Red burning rash in groin   Musculoskeletal: Positive for arthralgias and back pain.  Allergic/Immunologic: Negative.   Neurological: Negative.   Hematological: Negative.   Psychiatric/Behavioral: Positive for decreased concentration. Negative for suicidal ideas.       Depressed  All other systems reviewed and are negative.  Past Medical History:  Diagnosis Date  . Allergic rhinitis   . Colitis   . Depression   . Diverticulosis   . Frequency of urination   . GERD (gastroesophageal reflux disease)   . 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 History of iron deficiency anemia   . History of migraine headaches   . Hyperlipidemia   . IBS (irritable bowel syndrome)   . Memory loss   . Migraine headache   . OA (osteoarthritis)    RIGHT SHOULDER  . Recovering alcoholic ([place])    SINCE [id number]  . Right rotator cuff tear   . Unspecified essential hypertension     Social History   Social History  . Marital status: Widowed    Spouse name: N/A  . Number of children: N/A  . Years of education: N/A   Occupational History  . Not on file.   Social History Main Topics  . Smoking status: Former Smoker    Packs/day: 0.50    Years: 15.00    Types: Cigarettes    Quit date: 05/15/1985  . Smokeless tobacco: Never Used  . Alcohol use No     Comment: RECOVERING ALCOHOLIC--  QUIT 01-21-1980  . Drug use: No  . Sexual activity: No   Other Topics Concern  . 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 BUNIONECTOMY/  HAMMERTOE CORRECTION  RIGHT FOOT  2011  . CATARACT EXTRACTION W/ INTRAOCULAR LENS  IMPLANT, BILATERAL    . KNEE ARTHROSCOPY W/ MENISCECTOMY Bilateral X2  LEFT /    X1  RIGHT  .  REPLACEMENT TOTAL KNEE Left 2006  . SHOULDER ARTHROSCOPY WITH SUBACROMIAL DECOMPRESSION, ROTATOR CUFF REPAIR AND BICEP TENDON REPAIR Right 05/23/2013   Procedure: RIGHT SHOULDER ARTHROSCOPY EXAM UNDER ANESTHESIA  WITH SUBACROMIAL DECOMPRESSION,DISTAL CLAVICLE RESECTION, SADLABRAL DEBRIDEMENT CHONDROPLASTY, BICEP TENOTOMY ;  Surgeon: Sydnee Cabal, MD;  Location: [facility];  Service: Orthopedics;  Laterality: Right;  . TONSILLECTOMY AND ADENOIDECTOMY  AGE 62  . TOTAL KNEE ARTHROPLASTY Bilateral LEFT  1996/   RIGHT 2004  . VAGINAL HYSTERECTOMY  1976    Family History  Problem Relation Age of Onset  . Heart disease Father 47  . Hypertension Father   . Melanoma Mother   . Cancer Sister        skin CA, 2 sisters  . Esophageal cancer Brother   . Dementia Paternal Grandfather     Allergies  Allergen Reactions  . Penicillins Anaphylaxis  . Erythromycin Other (See Comments)    SEVERE STOMACH CRAMPS  . Morphine And Related Nausea And Vomiting  . Nsaids Other (See Comments)    SEVERE STOMACH CRAMPS **Able to tolerate Tylenol  . Nickel Rash    Current Outpatient Prescriptions on File Prior to Visit  Medication Sig Dispense Refill  . betamethasone dipropionate 0.05 % lotion Apply topically 2 (two) times daily. 60 mL 4  . L-Methylfolate-B12-B6-B2 (CEREFOLIN PO) Take by mouth.    . losartan (COZAAR) 50 MG tablet TAKE 1 TABLET(50 MG) BY MOUTH DAILY 90 tablet 2  . meclizine (ANTIVERT) 25 MG tablet Take 1 tablet (25 mg total) by mouth every 4 (four) hours as needed for dizziness. 60 tablet 0  . SUMAtriptan (IMITREX) 100 MG tablet Take 1 tablet (100 mg total) by mouth every 2 (two) hours as needed for migraine. May repeat in 2 hours if headache persists or recurs. 10 tablet 0  . traZODone (DESYREL) 50 MG tablet TAKE 1 TABLET(50 MG) BY MOUTH AT BEDTIME AS NEEDED FOR SLEEP 30 tablet 0  . triamcinolone (KENALOG) 0.025 % ointment Apply 1 application topically 2 (two) times daily. Do  not apply to face 15 g 1  . citalopram (CELEXA) 40 MG tablet Take 1 tablet (40 mg total) by mouth daily. (Patient not taking: Reported on 11/05/2016) 90 tablet 3   No current facility-administered medications on file prior to visit.     BP (!) 150/90 (BP Location: Left Arm)   Temp 98.6 F (37 C) (Oral)   Ht 5' 5.5" (1.664 m)   Wt 220 lb (99.8 kg)   BMI 36.05 kg/m       Objective:   Physical Exam  Constitutional: She is oriented to person, place, and time. She appears well-developed and well-nourished. No distress.  Obese   HENT:  Head: Normocephalic and atraumatic.  Right Ear: External ear normal.  Left Ear: External ear normal.  Nose: Nose  normal.  Mouth/Throat: Oropharynx is clear and moist. No oropharyngeal exudate.  Eyes: Conjunctivae and EOM are normal. Right eye exhibits no discharge. Left eye exhibits no discharge.  Neck: Normal range of motion. Neck supple. No JVD present. No tracheal deviation present. No thyromegaly present.  Cardiovascular: Normal rate, regular rhythm, normal heart sounds and intact distal pulses.  Exam reveals no gallop and no friction rub.   No murmur heard. Pulmonary/Chest: Effort normal and breath sounds normal. No stridor. No respiratory distress. She has no wheezes. She has no rales. She exhibits no tenderness.  Abdominal: Soft. Bowel sounds are normal. She exhibits no distension and no mass. There is no tenderness. There is no rebound and no guarding.  Musculoskeletal: Normal range of motion. She exhibits no edema, tenderness or deformity.  Lymphadenopathy:    She has no cervical adenopathy.  Neurological: She is alert and oriented to person, place, and time. She has normal reflexes. She displays normal reflexes. No cranial nerve deficit. Coordination normal.  Skin: Skin is warm and dry. Rash noted. She is not diaphoretic. No erythema. No pallor.  Fungal infection in bilateral sides of groin. Yeasty smell   Psychiatric: She has a normal mood  and affect. Her behavior is normal. Judgment and thought content normal.  Nursing note and vitals reviewed.      Assessment & Plan:  1. Essential hypertension, benign - BP 150/90 today- she has not taken her medications today  - Educated on the importance of diet and exercise  - Follow up in one year for CPE  - Follow up sooner if needed - Basic metabolic panel - CBC with Differential/Platelet - Hepatic function panel - Lipid panel - TSH - Hemoglobin A1c  2. Mixed hyperlipidemia - Consider statins  - Basic metabolic panel - CBC with Differential/Platelet - Hepatic function panel - Lipid panel - TSH - Hemoglobin A1c  3. Tinea cruris  - nystatin (MYCOSTATIN/NYSTOP) powder; Apply topically 3 (three) times daily.  Dispense: 30 g; Refill: 3  4. Depression, recurrent ([place]) - Will trial  - sertraline (ZOLOFT) 50 MG tablet; Take 1 tablet (50 mg total) by mouth daily.  Dispense: 30 tablet; Refill: 3  5. Need for hepatitis C screening test  - Hep C Antibody  6. Elevated blood sugar  - Basic metabolic panel - CBC with Differential/Platelet - Hepatic function panel - Lipid panel - TSH - Hemoglobin A1c   7. Need for prophylactic vaccination and inoculation against influenza  - Flu vaccine HIGH DOSE PF (Fluzone High dose)   [name], NP

## 2016-11-05 NOTE — Patient Instructions (Signed)
I have sent in two prescriptions   1. Zoloft 50 mg  2. Nystatin powder   I will follow up with you regarding your labs  Please follow up with me in 4 weeks or sooner if needed

## 2016-11-05 NOTE — Telephone Encounter (Signed)
Sent to the pharmacy by e-scribe. 

## 2016-11-06 ENCOUNTER — Encounter: Payer: Self-pay | Admitting: Adult Health

## 2016-11-06 LAB — HEPATITIS C ANTIBODY
Hepatitis C Ab: NONREACTIVE
SIGNAL TO CUT-OFF: 0.01 (ref <1.00)

## 2016-11-11 NOTE — Telephone Encounter (Signed)
Sent to the pharmacy by e-scribe for 6 months. 

## 2016-11-11 NOTE — Telephone Encounter (Signed)
Pt has been scheduled.  °

## 2016-12-03 ENCOUNTER — Encounter: Payer: Self-pay | Admitting: Adult Health

## 2016-12-03 ENCOUNTER — Ambulatory Visit (INDEPENDENT_AMBULATORY_CARE_PROVIDER_SITE_OTHER): Payer: Medicare Other | Admitting: Adult Health

## 2016-12-03 VITALS — BP 160/88 | Temp 98.4°F | Ht 65.5 in | Wt 224.0 lb

## 2016-12-03 DIAGNOSIS — F329 Major depressive disorder, single episode, unspecified: Secondary | ICD-10-CM

## 2016-12-03 DIAGNOSIS — F419 Anxiety disorder, unspecified: Secondary | ICD-10-CM

## 2016-12-03 NOTE — Progress Notes (Signed)
Subjective:    Patient ID: Tonya Bartlett, female    DOB: 08-30-1945, 71 y.o.   MRN: [id number]  HPI   71 year old female who  has a past medical history of Allergic rhinitis; Colitis; Depression; Diverticulosis; Frequency of urination; GERD (gastroesophageal reflux disease); History of DVT of lower extremity; History of iron deficiency anemia; History of migraine headaches; Hyperlipidemia; IBS (irritable bowel syndrome); Memory loss; Migraine headache; OA (osteoarthritis); Recovering alcoholic ([place]); Right rotator cuff tear; and Unspecified essential hypertension. She presents to the office today for 4 week follow up regarding depression. During her yearly follow up exam, she expressed concerns that she did not feel as though Celexa was helping any longer with managing depression and anxiety. She had actually discontinued the medication 3 weeks prior to seeing me. We discussed other options and settled on a trial of Zoloft 50 mg.   Today in the office she reports that she did not fill her Zoloft as she did not get a call from the pharmacy to let her know it was filled and she forgot about it.     Review of Systems See HPI   Past Medical History:  Diagnosis Date  . Allergic rhinitis   . Colitis   . Depression   . Diverticulosis   . Frequency of urination   . GERD (gastroesophageal reflux disease)   . 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 History of iron deficiency anemia   . History of migraine headaches   . Hyperlipidemia   . IBS (irritable bowel syndrome)   . Memory loss   . Migraine headache   . OA (osteoarthritis)    RIGHT SHOULDER  . Recovering alcoholic ([place])    SINCE [id number]  . Right rotator cuff tear   . Unspecified essential hypertension     Social History   Social History  . Marital status: Widowed    Spouse name: N/A  . Number of children: N/A  . Years of education: N/A   Occupational History  . Not on file.   Social  History Main Topics  . Smoking status: Former Smoker    Packs/day: 0.50    Years: 15.00    Types: Cigarettes    Quit date: 05/15/1985  . Smokeless tobacco: Never Used  . Alcohol use No     Comment: RECOVERING ALCOHOLIC--  QUIT [id number]  . Drug use: No  . Sexual activity: No   Other Topics Concern  . 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 BUNIONECTOMY/  HAMMERTOE CORRECTION  RIGHT FOOT  2011  . CATARACT EXTRACTION W/ INTRAOCULAR LENS  IMPLANT, BILATERAL    . KNEE ARTHROSCOPY W/ MENISCECTOMY Bilateral X2  LEFT /    X1  RIGHT  . REPLACEMENT TOTAL KNEE Left 2006  . SHOULDER ARTHROSCOPY WITH SUBACROMIAL DECOMPRESSION, ROTATOR CUFF REPAIR AND BICEP TENDON REPAIR Right 05/23/2013   Procedure: RIGHT SHOULDER ARTHROSCOPY EXAM UNDER ANESTHESIA  WITH SUBACROMIAL DECOMPRESSION,DISTAL CLAVICLE RESECTION, SADLABRAL DEBRIDEMENT CHONDROPLASTY, BICEP TENOTOMY ;  Surgeon: Sydnee Cabal, MD;  Location: [facility];  Service: Orthopedics;  Laterality: Right;  . TONSILLECTOMY AND ADENOIDECTOMY  AGE 15  . TOTAL KNEE ARTHROPLASTY  Bilateral LEFT  1996/   RIGHT 2004  . VAGINAL HYSTERECTOMY  1976    Family History  Problem Relation Age of Onset  . Heart disease Father 76  . Hypertension Father   . Melanoma Mother   . Cancer Sister        skin CA, 2 sisters  . Esophageal cancer Brother   . Dementia Paternal Grandfather     Allergies  Allergen Reactions  . Penicillins Anaphylaxis  . Erythromycin Other (See Comments)    SEVERE STOMACH CRAMPS  . Morphine And Related Nausea And Vomiting  . Nsaids Other (See Comments)    SEVERE STOMACH CRAMPS **Able to tolerate Tylenol  . Nickel Rash    Current Outpatient Prescriptions on File Prior to Visit  Medication Sig Dispense Refill  . atorvastatin  (LIPITOR) 20 MG tablet Take 1 tablet (20 mg total) by mouth daily. 90 tablet 3  . betamethasone dipropionate 0.05 % lotion Apply topically 2 (two) times daily. 60 mL 4  . citalopram (CELEXA) 40 MG tablet Take 1 tablet (40 mg total) by mouth daily. 90 tablet 3  . L-Methylfolate-B12-B6-B2 (CEREFOLIN PO) Take by mouth.    . losartan (COZAAR) 50 MG tablet TAKE 1 TABLET(50 MG) BY MOUTH DAILY 90 tablet 2  . meclizine (ANTIVERT) 25 MG tablet Take 1 tablet (25 mg total) by mouth every 4 (four) hours as needed for dizziness. 60 tablet 0  . nystatin (MYCOSTATIN/NYSTOP) powder Apply topically 3 (three) times daily. 30 g 3  . SUMAtriptan (IMITREX) 100 MG tablet Take 1 tablet (100 mg total) by mouth every 2 (two) hours as needed for migraine. May repeat in 2 hours if headache persists or recurs. 10 tablet 0  . traZODone (DESYREL) 50 MG tablet TAKE 1 TABLET(50 MG) BY MOUTH AT BEDTIME AS NEEDED FOR SLEEP 90 tablet 1  . triamcinolone (KENALOG) 0.025 % ointment Apply 1 application topically 2 (two) times daily. Do not apply to face 15 g 1  . sertraline (ZOLOFT) 50 MG tablet Take 1 tablet (50 mg total) by mouth daily. (Patient not taking: Reported on 12/03/2016) 30 tablet 3   No current facility-administered medications on file prior to visit.     BP (!) 160/88 (BP Location: Left Arm)   Temp 98.4 F (36.9 C) (Oral)   Ht 5' 5.5" (1.664 m)   Wt 224 lb (101.6 kg)   BMI 36.71 kg/m       Objective:   Physical Exam  Constitutional: She is oriented to person, place, and time. She appears well-developed and well-nourished. No distress.  HENT:  Head: Normocephalic and atraumatic.  Right Ear: External ear normal.  Left Ear: External ear normal.  Nose: Nose normal.  Mouth/Throat: Oropharynx is clear and moist. No oropharyngeal exudate.  Eyes: Pupils are equal, round, and reactive to light. Conjunctivae and EOM are normal. Right eye exhibits no discharge. Left eye exhibits no discharge. No scleral icterus.    Cardiovascular: Normal rate, regular rhythm, normal heart sounds and intact distal pulses.  Exam reveals no gallop and no friction rub.   No murmur heard. Pulmonary/Chest: Effort normal and breath sounds normal. No respiratory distress. She has no wheezes. She has no rales. She exhibits no tenderness.  Neurological: She is alert and oriented to person, place, and time.  Skin: Skin is warm and dry. No rash noted. She is not diaphoretic. No erythema. No pallor.  Psychiatric: She has a normal mood and affect. Her behavior is normal. Judgment and thought content normal.  Depressed       Assessment & Plan:  1. Anxiety and depression - Get medication and follow up in one month   [name], NP

## 2016-12-09 ENCOUNTER — Encounter: Payer: Self-pay | Admitting: Family Medicine

## 2016-12-09 ENCOUNTER — Ambulatory Visit (INDEPENDENT_AMBULATORY_CARE_PROVIDER_SITE_OTHER): Payer: Medicare Other | Admitting: Family Medicine

## 2016-12-09 ENCOUNTER — Telehealth: Payer: Self-pay | Admitting: Adult Health

## 2016-12-09 VITALS — BP 130/94 | HR 64 | Temp 98.1°F | Ht 65.5 in | Wt 221.0 lb

## 2016-12-09 DIAGNOSIS — R42 Dizziness and giddiness: Secondary | ICD-10-CM | POA: Diagnosis not present

## 2016-12-09 DIAGNOSIS — M722 Plantar fascial fibromatosis: Secondary | ICD-10-CM | POA: Diagnosis not present

## 2016-12-09 MED ORDER — OMEPRAZOLE 40 MG PO CPDR: 40.0000 mg | DELAYED_RELEASE_CAPSULE | Freq: Every day | ORAL | 3 refills | Status: DC

## 2016-12-09 NOTE — Telephone Encounter (Signed)
Patient has appointment scheduled for today

## 2016-12-09 NOTE — Patient Instructions (Signed)
WE NOW OFFER   [facility] FAST TRACK!!!  SAME DAY Appointments for ACUTE CARE  Such as: Sprains, Injuries, cuts, abrasions, rashes, muscle pain, joint pain, back pain Colds, flu, sore throats, headache, allergies, cough, fever  Ear pain, sinus and eye infections Abdominal pain, nausea, vomiting, diarrhea, upset stomach Animal/insect bites  3 Easy Ways to Schedule: Walk-In Scheduling Call in scheduling Mychart Sign-up: [email or web address]         

## 2016-12-09 NOTE — Telephone Encounter (Signed)
[facility] - Client [place] Call Center  Patient Name: Tonya Bartlett [facility]  DOB: [birth year]    Initial Comment caller has been experiencing a lot of dizziness when she turns her head a certain way or gets up . She has also had a loss of balance    Nurse Assessment  Nurse: Rock Nephew, RN, Juliann Pulse Date/Time (Eastern Time): 12/09/2016 10:03:39 AM  Confirm and document reason for call. If symptomatic, describe symptoms. ---Caller states that she has been having dizziness with standing , or if she turns her head a certain way , and a loss of balance. This has been going on for the last 4 days.  Does the patient have any new or worsening symptoms? ---Yes  Will a triage be completed? ---Yes  Related visit to physician within the last 2 weeks? ---No  Does the PT have any chronic conditions? (i.e. diabetes, asthma, etc.) ---Yes  List chronic conditions. ---memory loss ( sees Neurologist ) , HTN  Is this a behavioral health or substance abuse call? ---No     Guidelines    Guideline Title Affirmed Question Affirmed Notes  Dizziness - Lightheadedness SEVERE dizziness (e.g., unable to stand, requires support to walk, feels like passing out now)    Final Disposition User   Go to ED Now (or PCP triage) Rock Nephew, RN, Juliann Pulse    Comments  Since office is open now,appt scheduled vs sending patient to the ED Appt scheduled for 2pm today with Dr. Elease Hashimoto at the [place] office ( per Donnie Aho RN. Caller notified and verbalized acceptance.   Referrals  REFERRED TO PCP OFFICE   Caller Disagree/Comply Comply  Caller Understands Yes  PreDisposition Call Doctor

## 2016-12-09 NOTE — Progress Notes (Signed)
   Subjective:    Patient ID: Tonya Bartlett, female    DOB: 08/21/1945, 71 y.o.   MRN: [id number]  HPI Here for several issues. First she has had 4 days of intermittent dizziness which is triggered by sudden head movements. This lasts 5 to 10 minutes and resolves. No headache or other neurologic deficits. She has had vertigo in the past. Also she has had intermittent pain in the right heel for several weeks.    Review of Systems  Constitutional: Negative.   HENT: Negative.   Eyes: Negative.   Respiratory: Negative.   Cardiovascular: Negative.   Musculoskeletal: Positive for arthralgias.  Neurological: Positive for dizziness. Negative for tremors, seizures, syncope, facial asymmetry, speech difficulty, weakness, light-headedness, numbness and headaches.       Objective:   Physical Exam  Constitutional: She is oriented to person, place, and time. She appears well-developed and well-nourished. No distress.  HENT:  Head: Normocephalic and atraumatic.  Right Ear: External ear normal.  Left Ear: External ear normal.  Nose: Nose normal.  Mouth/Throat: Oropharynx is clear and moist.  Eyes: Pupils are equal, round, and reactive to light. Conjunctivae and EOM are normal.  Neck: No thyromegaly present.  Cardiovascular: Normal rate, regular rhythm, normal heart sounds and intact distal pulses.   Pulmonary/Chest: Effort normal and breath sounds normal. No respiratory distress. She has no rales.  Musculoskeletal:  Tender at the bottom of the right heel  Lymphadenopathy:    She has no cervical adenopathy.  Neurological: She is alert and oriented to person, place, and time. No cranial nerve deficit. Coordination normal.          Assessment & Plan:  She has vertigo. Drink plenty of fluids, take Claritin 10 mg bid for a few days, and add Meclizine as needed. For the plantar fasciitis take Ibuprofen prn and wear arch supports in the shoes.  Alysia Penna, MD

## 2016-12-31 DIAGNOSIS — M25552 Pain in left hip: Secondary | ICD-10-CM | POA: Diagnosis not present

## 2016-12-31 DIAGNOSIS — M1612 Unilateral primary osteoarthritis, left hip: Secondary | ICD-10-CM | POA: Diagnosis not present

## 2017-01-01 ENCOUNTER — Ambulatory Visit (INDEPENDENT_AMBULATORY_CARE_PROVIDER_SITE_OTHER): Payer: Medicare Other | Admitting: Adult Health

## 2017-01-01 ENCOUNTER — Encounter: Payer: Self-pay | Admitting: Adult Health

## 2017-01-01 VITALS — BP 120/80 | Temp 98.5°F | Wt 223.0 lb

## 2017-01-01 DIAGNOSIS — F339 Major depressive disorder, recurrent, unspecified: Secondary | ICD-10-CM

## 2017-01-01 NOTE — Progress Notes (Signed)
Subjective:    Patient ID: Tonya Bartlett, female    DOB: 1945/08/28, 71 y.o.   MRN: [id number]  HPI   71 year old female who  has a past medical history of Allergic rhinitis, Colitis, Depression, Diverticulosis, Frequency of urination, GERD (gastroesophageal reflux disease), History of DVT of lower extremity, History of iron deficiency anemia, History of migraine headaches, Hyperlipidemia, IBS (irritable bowel syndrome), Memory loss, Migraine headache, OA (osteoarthritis), Recovering alcoholic ([place]), Right rotator cuff tear, and Unspecified essential hypertension.  She presents to the office today for follow up regarding depression. She did not feel as though Celexa was working well for her so she was switched to Zoloft 50 mg.   She reports that she is taking Zoloft as prescribed. She continues to feel depressed but feels as though the Zoloft may be helping. She is not getting out of the house as much as she would like and feels as though she is not doing things she once enjoyed. Denies any SI   Review of Systems See HPI   Past Medical History:  Diagnosis Date  . Allergic rhinitis   . Colitis   . Depression   . Diverticulosis   . Frequency of urination   . GERD (gastroesophageal reflux disease)   . 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 History of iron deficiency anemia   . History of migraine headaches   . Hyperlipidemia   . IBS (irritable bowel syndrome)   . Memory loss   . Migraine headache   . OA (osteoarthritis)    RIGHT SHOULDER  . Recovering alcoholic ([place])    SINCE [id number]  . Right rotator cuff tear   . Unspecified essential hypertension     Social History   Socioeconomic History  . Marital status: Widowed    Spouse name: Not on file  . Number of children: Not on file  . Years of education: Not on file  . Highest education level: Not on file  Social Needs  . Financial resource strain: Not on file  . Food insecurity - worry:  Not on file  . Food insecurity - inability: Not on file  . Transportation needs - medical: Not on file  . Transportation needs - non-medical: Not on file  Occupational History  . Not on file  Tobacco Use  . Smoking status: Former Smoker    Packs/day: 0.50    Years: 15.00    Pack years: 7.50    Types: Cigarettes    Last attempt to quit: 05/15/1985    Years since quitting: 31.6  . Smokeless tobacco: Never Used  Substance and Sexual Activity  . Alcohol use: No    Comment: RECOVERING ALCOHOLIC--  QUIT [id number]  . Drug use: No  . Sexual activity: No  Other Topics Concern  . 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 BUNIONECTOMY/  HAMMERTOE CORRECTION  RIGHT FOOT  2011  . CATARACT EXTRACTION W/ INTRAOCULAR LENS  IMPLANT, BILATERAL    . KNEE ARTHROSCOPY W/ MENISCECTOMY Bilateral X2  LEFT /    X1  RIGHT  . REPLACEMENT TOTAL KNEE Left 2006  . RIGHT SHOULDER ARTHROSCOPY EXAM UNDER ANESTHESIA  WITH SUBACROMIAL DECOMPRESSION,DISTAL CLAVICLE  RESECTION, SADLABRAL DEBRIDEMENT CHONDROPLASTY, BICEP TENOTOMY Right 05/23/2013   Performed by Sydnee Cabal, MD at [facility]  . TONSILLECTOMY AND ADENOIDECTOMY  AGE 42  . TOTAL KNEE ARTHROPLASTY Bilateral LEFT  1996/   RIGHT 2004  . VAGINAL HYSTERECTOMY  1976    Family History  Problem Relation Age of Onset  . Heart disease Father 52  . Hypertension Father   . Melanoma Mother   . Cancer Sister        skin CA, 2 sisters  . Esophageal cancer Brother   . Dementia Paternal Grandfather     Allergies  Allergen Reactions  . Penicillins Anaphylaxis  . Erythromycin Other (See Comments)    SEVERE STOMACH CRAMPS  . Morphine And Related Nausea And Vomiting  . Nsaids Other (See Comments)    SEVERE STOMACH CRAMPS **Able to tolerate Tylenol  .  Nickel Rash    Current Outpatient Medications on File Prior to Visit  Medication Sig Dispense Refill  . betamethasone dipropionate 0.05 % lotion Apply topically 2 (two) times daily. 60 mL 4  . L-Methylfolate-B12-B6-B2 (CEREFOLIN PO) Take by mouth.    . losartan (COZAAR) 50 MG tablet TAKE 1 TABLET(50 MG) BY MOUTH DAILY 90 tablet 2  . meclizine (ANTIVERT) 25 MG tablet Take 1 tablet (25 mg total) by mouth every 4 (four) hours as needed for dizziness. 60 tablet 0  . nystatin (MYCOSTATIN/NYSTOP) powder Apply topically 3 (three) times daily. 30 g 3  . omeprazole (PRILOSEC) 40 MG capsule Take 1 capsule (40 mg total) by mouth daily. 30 capsule 3  . sertraline (ZOLOFT) 50 MG tablet Take 1 tablet (50 mg total) by mouth daily. 30 tablet 3  . SUMAtriptan (IMITREX) 100 MG tablet Take 1 tablet (100 mg total) by mouth every 2 (two) hours as needed for migraine. May repeat in 2 hours if headache persists or recurs. 10 tablet 0  . traZODone (DESYREL) 50 MG tablet TAKE 1 TABLET(50 MG) BY MOUTH AT BEDTIME AS NEEDED FOR SLEEP 90 tablet 1  . atorvastatin (LIPITOR) 20 MG tablet Take 1 tablet (20 mg total) by mouth daily. 90 tablet 3   No current facility-administered medications on file prior to visit.     BP 120/80 (BP Location: Left Arm)   Temp 98.5 F (36.9 C) (Oral)   Wt 223 lb (101.2 kg)   BMI 36.54 kg/m       Objective:   Physical Exam  Constitutional: She is oriented to person, place, and time. She appears well-developed and well-nourished. No distress.  Cardiovascular: Normal rate, regular rhythm, normal heart sounds and intact distal pulses. Exam reveals no gallop and no friction rub.  No murmur heard. Pulmonary/Chest: Effort normal and breath sounds normal. No respiratory distress. She has no wheezes. She has no rales. She exhibits no tenderness.  Neurological: She is alert and oriented to person, place, and time.  Skin: Skin is warm and dry. No rash noted. She is not diaphoretic. No erythema.  No pallor.  Psychiatric: She has a normal mood and affect. Her behavior is normal. Judgment and thought content normal.  Nursing note and vitals reviewed.     Assessment & Plan:  1. Depression, recurrent ([place]) - Continue with zoloft  - Encouraged to get out of the house and go for a walk or get a massage. Do something she enjoys  - She is going to follow up with Psychiatry   [name], NP

## 2017-01-06 DIAGNOSIS — M25552 Pain in left hip: Secondary | ICD-10-CM | POA: Diagnosis not present

## 2017-01-06 DIAGNOSIS — M1612 Unilateral primary osteoarthritis, left hip: Secondary | ICD-10-CM | POA: Diagnosis not present

## 2017-01-11 DIAGNOSIS — M7731 Calcaneal spur, right foot: Secondary | ICD-10-CM | POA: Diagnosis not present

## 2017-01-11 DIAGNOSIS — M71571 Other bursitis, not elsewhere classified, right ankle and foot: Secondary | ICD-10-CM | POA: Diagnosis not present

## 2017-01-11 DIAGNOSIS — M722 Plantar fascial fibromatosis: Secondary | ICD-10-CM | POA: Diagnosis not present

## 2017-01-18 DIAGNOSIS — M71571 Other bursitis, not elsewhere classified, right ankle and foot: Secondary | ICD-10-CM | POA: Diagnosis not present

## 2017-01-18 DIAGNOSIS — M722 Plantar fascial fibromatosis: Secondary | ICD-10-CM | POA: Diagnosis not present

## 2017-01-19 DIAGNOSIS — M71571 Other bursitis, not elsewhere classified, right ankle and foot: Secondary | ICD-10-CM | POA: Diagnosis not present

## 2017-01-19 DIAGNOSIS — M722 Plantar fascial fibromatosis: Secondary | ICD-10-CM | POA: Diagnosis not present

## 2017-02-01 DIAGNOSIS — M722 Plantar fascial fibromatosis: Secondary | ICD-10-CM | POA: Diagnosis not present

## 2017-02-01 DIAGNOSIS — M71571 Other bursitis, not elsewhere classified, right ankle and foot: Secondary | ICD-10-CM | POA: Diagnosis not present

## 2017-02-11 ENCOUNTER — Other Ambulatory Visit: Payer: Self-pay | Admitting: Gastroenterology

## 2017-02-11 DIAGNOSIS — M7731 Calcaneal spur, right foot: Secondary | ICD-10-CM | POA: Diagnosis not present

## 2017-02-11 DIAGNOSIS — M71571 Other bursitis, not elsewhere classified, right ankle and foot: Secondary | ICD-10-CM | POA: Diagnosis not present

## 2017-02-11 DIAGNOSIS — S93699A Other sprain of unspecified foot, initial encounter: Secondary | ICD-10-CM | POA: Diagnosis not present

## 2017-02-16 DIAGNOSIS — C439 Malignant melanoma of skin, unspecified: Secondary | ICD-10-CM

## 2017-02-16 DIAGNOSIS — C801 Malignant (primary) neoplasm, unspecified: Secondary | ICD-10-CM

## 2017-02-16 HISTORY — DX: Malignant (primary) neoplasm, unspecified: C80.1

## 2017-02-16 HISTORY — DX: Malignant melanoma of skin, unspecified: C43.9

## 2017-02-18 DIAGNOSIS — S93691D Other sprain of right foot, subsequent encounter: Secondary | ICD-10-CM | POA: Diagnosis not present

## 2017-02-18 DIAGNOSIS — M7731 Calcaneal spur, right foot: Secondary | ICD-10-CM | POA: Diagnosis not present

## 2017-02-24 ENCOUNTER — Other Ambulatory Visit: Payer: Self-pay | Admitting: Adult Health

## 2017-02-24 DIAGNOSIS — Z139 Encounter for screening, unspecified: Secondary | ICD-10-CM

## 2017-02-28 ENCOUNTER — Other Ambulatory Visit: Payer: Self-pay | Admitting: Adult Health

## 2017-02-28 DIAGNOSIS — F339 Major depressive disorder, recurrent, unspecified: Secondary | ICD-10-CM

## 2017-03-01 ENCOUNTER — Telehealth: Payer: Self-pay | Admitting: Family Medicine

## 2017-03-01 NOTE — Telephone Encounter (Signed)
Copied from [place] [id number]. Topic: Inquiry >> Mar 01, 2017 10:16 AM Scherrie Gerlach wrote: Reason for CRM:  pt states Tommi Rumps gave her a name for psychiatry dr in [facility].  Pt states she forgot the name and would like Cory to give to her again. Pt aware Tommi Rumps out of office today and that is OK.     I spoke with pt and gave name and location.

## 2017-03-02 NOTE — Telephone Encounter (Signed)
Left a message for a return call.

## 2017-03-02 NOTE — Telephone Encounter (Signed)
Tonya Bartlett, please document that pt was informed of below info.

## 2017-03-02 NOTE — Telephone Encounter (Signed)
[facility] in [place]... If she is taking new patients.

## 2017-03-03 DIAGNOSIS — H52223 Regular astigmatism, bilateral: Secondary | ICD-10-CM | POA: Diagnosis not present

## 2017-03-03 DIAGNOSIS — H26491 Other secondary cataract, right eye: Secondary | ICD-10-CM | POA: Diagnosis not present

## 2017-03-03 DIAGNOSIS — H26492 Other secondary cataract, left eye: Secondary | ICD-10-CM | POA: Diagnosis not present

## 2017-03-03 DIAGNOSIS — H5201 Hypermetropia, right eye: Secondary | ICD-10-CM | POA: Diagnosis not present

## 2017-03-03 DIAGNOSIS — H5212 Myopia, left eye: Secondary | ICD-10-CM | POA: Diagnosis not present

## 2017-03-03 NOTE — Telephone Encounter (Signed)
Ok to refill for 90 days. She has not seen psychiatry yet

## 2017-03-03 NOTE — Telephone Encounter (Signed)
Should psychiatry be filling?  See you last note.

## 2017-03-04 NOTE — Telephone Encounter (Signed)
Sent to the pharmacy by e-scribe. 

## 2017-03-05 DIAGNOSIS — M7731 Calcaneal spur, right foot: Secondary | ICD-10-CM | POA: Diagnosis not present

## 2017-03-05 DIAGNOSIS — M71571 Other bursitis, not elsewhere classified, right ankle and foot: Secondary | ICD-10-CM | POA: Diagnosis not present

## 2017-03-05 DIAGNOSIS — M722 Plantar fascial fibromatosis: Secondary | ICD-10-CM | POA: Diagnosis not present

## 2017-03-07 ENCOUNTER — Other Ambulatory Visit: Payer: Self-pay | Admitting: Adult Health

## 2017-03-07 DIAGNOSIS — I10 Essential (primary) hypertension: Secondary | ICD-10-CM

## 2017-03-10 NOTE — Telephone Encounter (Signed)
Sent to the pharmacy by e-scribe. 

## 2017-03-11 DIAGNOSIS — M722 Plantar fascial fibromatosis: Secondary | ICD-10-CM | POA: Diagnosis not present

## 2017-03-11 DIAGNOSIS — M71571 Other bursitis, not elsewhere classified, right ankle and foot: Secondary | ICD-10-CM | POA: Diagnosis not present

## 2017-03-15 ENCOUNTER — Ambulatory Visit
Admission: RE | Admit: 2017-03-15 | Discharge: 2017-03-15 | Disposition: A | Discharge status: Home / Self Care | Payer: Medicare Other | Source: Ambulatory Visit | Attending: Adult Health | Admitting: Adult Health

## 2017-03-15 DIAGNOSIS — L4050 Arthropathic psoriasis, unspecified: Secondary | ICD-10-CM | POA: Diagnosis not present

## 2017-03-15 DIAGNOSIS — M150 Primary generalized (osteo)arthritis: Secondary | ICD-10-CM | POA: Diagnosis not present

## 2017-03-15 DIAGNOSIS — M25511 Pain in right shoulder: Secondary | ICD-10-CM | POA: Diagnosis not present

## 2017-03-15 DIAGNOSIS — E669 Obesity, unspecified: Secondary | ICD-10-CM | POA: Diagnosis not present

## 2017-03-15 DIAGNOSIS — Z139 Encounter for screening, unspecified: Secondary | ICD-10-CM

## 2017-03-15 DIAGNOSIS — G5622 Lesion of ulnar nerve, left upper limb: Secondary | ICD-10-CM | POA: Diagnosis not present

## 2017-03-15 DIAGNOSIS — Z1231 Encounter for screening mammogram for malignant neoplasm of breast: Secondary | ICD-10-CM | POA: Diagnosis not present

## 2017-03-15 DIAGNOSIS — L401 Generalized pustular psoriasis: Secondary | ICD-10-CM | POA: Diagnosis not present

## 2017-03-15 DIAGNOSIS — M5136 Other intervertebral disc degeneration, lumbar region: Secondary | ICD-10-CM | POA: Diagnosis not present

## 2017-03-15 DIAGNOSIS — M5030 Other cervical disc degeneration, unspecified cervical region: Secondary | ICD-10-CM | POA: Diagnosis not present

## 2017-03-15 DIAGNOSIS — Z6837 Body mass index (BMI) 37.0-37.9, adult: Secondary | ICD-10-CM | POA: Diagnosis not present

## 2017-03-15 DIAGNOSIS — M25552 Pain in left hip: Secondary | ICD-10-CM | POA: Diagnosis not present

## 2017-03-17 DIAGNOSIS — M722 Plantar fascial fibromatosis: Secondary | ICD-10-CM | POA: Diagnosis not present

## 2017-03-17 DIAGNOSIS — Z01812 Encounter for preprocedural laboratory examination: Secondary | ICD-10-CM | POA: Diagnosis not present

## 2017-03-17 DIAGNOSIS — E119 Type 2 diabetes mellitus without complications: Secondary | ICD-10-CM | POA: Diagnosis not present

## 2017-03-25 ENCOUNTER — Ambulatory Visit (INDEPENDENT_AMBULATORY_CARE_PROVIDER_SITE_OTHER): Payer: Medicare Other | Admitting: Adult Health

## 2017-03-25 ENCOUNTER — Encounter: Payer: Self-pay | Admitting: Adult Health

## 2017-03-25 VITALS — BP 160/100 | Temp 98.4°F | Wt 236.0 lb

## 2017-03-25 DIAGNOSIS — F339 Major depressive disorder, recurrent, unspecified: Secondary | ICD-10-CM

## 2017-03-25 DIAGNOSIS — H0012 Chalazion right lower eyelid: Secondary | ICD-10-CM | POA: Diagnosis not present

## 2017-03-25 MED ORDER — BUPROPION HCL ER (XL) 150 MG PO TB24: 150.0000 mg | ORAL_TABLET | Freq: Every day | ORAL | 1 refills | Status: DC

## 2017-03-25 NOTE — Progress Notes (Signed)
Subjective:    Patient ID: Tonya Bartlett, female    DOB: 1945-04-05, 72 y.o.   MRN: [id number]  HPI  72 year old female who  has a past medical history of Allergic rhinitis, Colitis, Depression, Diverticulosis, Frequency of urination, GERD (gastroesophageal reflux disease), History of DVT of lower extremity, History of iron deficiency anemia, History of migraine headaches, Hyperlipidemia, IBS (irritable bowel syndrome), Memory loss, Migraine headache, OA (osteoarthritis), Recovering alcoholic ([place]), Right rotator cuff tear, and Unspecified essential hypertension.   She presents to the clinic today for the acute complaint of pain along the right lower eye lid. Pain has been present for a few days. She would like to make sure she does not have pink eye as she has family coming in for the weekend.   Denies any drainage or waking up as if her eye is matted shut. Denies any blurred vision   She would also like to switch from Zoloft to Wellbutrin ( has been on Wellbutrin in the past and did well with this medication) She has gained about 13 pounds while being on Zoloft.   Review of Systems See HPI   Past Medical History:  Diagnosis Date  . Allergic rhinitis   . Colitis   . Depression   . Diverticulosis   . Frequency of urination   . GERD (gastroesophageal reflux disease)   . 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 History of iron deficiency anemia   . History of migraine headaches   . Hyperlipidemia   . IBS (irritable bowel syndrome)   . Memory loss   . Migraine headache   . OA (osteoarthritis)    RIGHT SHOULDER  . Recovering alcoholic ([place])    SINCE [id number]  . Right rotator cuff tear   . Unspecified essential hypertension     Social History   Socioeconomic History  . Marital status: Widowed    Spouse name: Not on file  . Number of children: Not on file  . Years of education: Not on file  . Highest education level: Not on file  Social Needs   . Financial resource strain: Not on file  . Food insecurity - worry: Not on file  . Food insecurity - inability: Not on file  . Transportation needs - medical: Not on file  . Transportation needs - non-medical: Not on file  Occupational History  . Not on file  Tobacco Use  . Smoking status: Former Smoker    Packs/day: 0.50    Years: 15.00    Pack years: 7.50    Types: Cigarettes    Last attempt to quit: 05/15/1985    Years since quitting: 31.8  . Smokeless tobacco: Never Used  Substance and Sexual Activity  . Alcohol use: No    Comment: RECOVERING ALCOHOLIC--  QUIT [id number]  . Drug use: No  . Sexual activity: No  Other Topics Concern  . 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 BUNIONECTOMY/  HAMMERTOE CORRECTION  RIGHT FOOT  2011  . CATARACT EXTRACTION W/ INTRAOCULAR LENS  IMPLANT, BILATERAL    . KNEE ARTHROSCOPY W/ MENISCECTOMY Bilateral X2  LEFT /    X1  RIGHT  . REPLACEMENT  TOTAL KNEE Left 2006  . SHOULDER ARTHROSCOPY WITH SUBACROMIAL DECOMPRESSION, ROTATOR CUFF REPAIR AND BICEP TENDON REPAIR Right 05/23/2013   Procedure: RIGHT SHOULDER ARTHROSCOPY EXAM UNDER ANESTHESIA  WITH SUBACROMIAL DECOMPRESSION,DISTAL CLAVICLE RESECTION, SADLABRAL DEBRIDEMENT CHONDROPLASTY, BICEP TENOTOMY ;  Surgeon: Sydnee Cabal, MD;  Location: [facility];  Service: Orthopedics;  Laterality: Right;  . TONSILLECTOMY AND ADENOIDECTOMY  AGE 41  . TOTAL KNEE ARTHROPLASTY Bilateral LEFT  1996/   RIGHT 2004  . VAGINAL HYSTERECTOMY  1976    Family History  Problem Relation Age of Onset  . Heart disease Father 42  . Hypertension Father   . Melanoma Mother   . Cancer Sister        skin CA, 2 sisters  . Esophageal cancer Brother   . Dementia Paternal Grandfather     Allergies  Allergen Reactions    . Penicillins Anaphylaxis  . Erythromycin Other (See Comments)    SEVERE STOMACH CRAMPS  . Morphine And Related Nausea And Vomiting  . Nsaids Other (See Comments)    SEVERE STOMACH CRAMPS **Able to tolerate Tylenol  . Nickel Rash    Current Outpatient Medications on File Prior to Visit  Medication Sig Dispense Refill  . atorvastatin (LIPITOR) 20 MG tablet Take 1 tablet (20 mg total) by mouth daily. 90 tablet 3  . betamethasone dipropionate 0.05 % lotion Apply topically 2 (two) times daily. 60 mL 4  . HYDROcodone-acetaminophen (NORCO/VICODIN) 5-325 MG tablet TAKE 1 TABLET OP EVERY 6 HOURS AS NEEDED  0  . L-Methylfolate-B12-B6-B2 (CEREFOLIN PO) Take by mouth.    . losartan (COZAAR) 50 MG tablet TAKE 1 TABLET(50 MG) BY MOUTH DAILY 90 tablet 2  . meclizine (ANTIVERT) 25 MG tablet Take 1 tablet (25 mg total) by mouth every 4 (four) hours as needed for dizziness. 60 tablet 0  . nystatin (MYCOSTATIN/NYSTOP) powder Apply topically 3 (three) times daily. 30 g 3  . omeprazole (PRILOSEC) 40 MG capsule Take 1 capsule (40 mg total) by mouth daily. 30 capsule 3  . sertraline (ZOLOFT) 50 MG tablet TAKE 1 TABLET(50 MG) BY MOUTH DAILY 90 tablet 0  . SUMAtriptan (IMITREX) 100 MG tablet Take 1 tablet (100 mg total) by mouth every 2 (two) hours as needed for migraine. May repeat in 2 hours if headache persists or recurs. 10 tablet 0  . traZODone (DESYREL) 50 MG tablet TAKE 1 TABLET(50 MG) BY MOUTH AT BEDTIME AS NEEDED FOR SLEEP 90 tablet 1   No current facility-administered medications on file prior to visit.     BP (!) 160/100 (BP Location: Left Arm)   Temp 98.4 F (36.9 C) (Oral)   Wt 236 lb (107 kg)   BMI 38.68 kg/m       Objective:   Physical Exam  Constitutional: She is oriented to person, place, and time. She appears well-developed and well-nourished. No distress.  HENT:  Small chalazion noted on inside of right lower lid.   Eyes: Conjunctivae, EOM and lids are normal. Pupils are equal,  round, and reactive to light. Lids are everted and swept, no foreign bodies found. Right eye exhibits no discharge. Left eye exhibits no discharge.  Neurological: She is alert and oriented to person, place, and time.  Skin: Skin is warm and dry. No rash noted. She is not diaphoretic. No erythema. No pallor.  Psychiatric: She has a normal mood and affect. Her behavior is normal. Judgment and thought content normal.  Nursing note and vitals reviewed.     Assessment &  Plan:  1. Chalazion of right lower eyelid - No signs of infection  - Advised warm compress for 20 minutes at a time throughout the day  - Follow up with eye doctor if not resolved in 1 week   2. Depression, recurrent ([place]) - D/c Zoloft. Will have her taper over the next 3-4 weeks.  - buPROPion (WELLBUTRIN XL) 150 MG 24 hr tablet; Take 1 tablet (150 mg total) by mouth daily.  Dispense: 90 tablet; Refill: 1 - Follow up as needed  [name], NP

## 2017-03-31 ENCOUNTER — Encounter: Payer: Self-pay | Admitting: Gastroenterology

## 2017-03-31 ENCOUNTER — Ambulatory Visit: Payer: Medicare Other | Admitting: Gastroenterology

## 2017-03-31 ENCOUNTER — Ambulatory Visit (INDEPENDENT_AMBULATORY_CARE_PROVIDER_SITE_OTHER): Payer: Medicare Other | Admitting: Gastroenterology

## 2017-03-31 VITALS — BP 150/90 | HR 64

## 2017-03-31 DIAGNOSIS — Z860100 Personal history of colon polyps, unspecified: Secondary | ICD-10-CM

## 2017-03-31 DIAGNOSIS — R159 Full incontinence of feces: Secondary | ICD-10-CM

## 2017-03-31 DIAGNOSIS — R32 Unspecified urinary incontinence: Secondary | ICD-10-CM

## 2017-03-31 DIAGNOSIS — R1310 Dysphagia, unspecified: Secondary | ICD-10-CM | POA: Diagnosis not present

## 2017-03-31 NOTE — Patient Instructions (Signed)
If you are age 73 or older, your body mass index should be between 23-30. Your There is no height or weight on file to calculate BMI. If this is out of the aforementioned range listed, please consider follow up with your Primary Care Provider.  If you are age 27 or younger, your body mass index should be between 19-25. Your There is no height or weight on file to calculate BMI. If this is out of the aformentioned range listed, please consider follow up with your Primary Care Provider.   We will refer you for Pelvic Floor Physical Therapy.  They will contact you to schedule an appointment.   Please begin taking a daily fiber supplement such as Citrucel.  Please contact us to schedule a colonoscopy within the year.  Our number is [phone].  Thank you for entrusting me with your care and for choosing [facility], Dr. Opdyke Cellar

## 2017-03-31 NOTE — Progress Notes (Signed)
HPI :  72 y/o female here for a follow up visit.   The patient previously was seen after she was admitted August 2017 in [place] with severe abdominal pain.  CT scan reportedly showed "descending colitis". She had a colonoscopy done on 10/16/15 showing sigmoid inflammation, diverticulosis, hemorrhoids, and multiple polyps reported - multiple polyps noted but not removed.  She underwent colonoscopy with me in February 2018 -she had 10 small polyps that were adenomatous/sessile serrated polyps.  She had diverticulosis and hemorrhoids also noted, she had no evidence of obvious colitis.  It was suspected she either had ischemic colitis versus infectious colitis that has since resolved.  Since that time she reports fluctuating bowel movements, she has anywhere from 2-5 bowel movements per day.  Her stool form is altered at times.  She states she often has some looser stools that her very difficult to clean.  She notes leaking stool in her underwear, and she also leaks stool when she urinates.  She also endorses urinary incontinence frequently.  She states with coughing and laughing she will leak urine and stool.  The symptoms are very concerning to her.  She otherwise states that her reflux symptoms are currently very well controlled.  She is taking Prilosec 40 mg once a day which is working well for her.  Her previously noted dysphagia has for the most part resolved after empiric dilation was performed in August 2017.  Procedure history: Colonoscopy 04/15/2016 - 10 small polyps - adenomatous / sessile serrated, diverticulosis, hemorrhoids, no colitis  EGD on 09/26/2015 - 4cm hiatal hernia, otherwise normal esophagus, empirically dilated to 45mm [place]. Mild gastritis noted   Past Medical History:  Diagnosis Date  . Allergic rhinitis   . Colitis   . Depression   . Diverticulosis   . Frequency of urination   . GERD (gastroesophageal reflux disease)   . 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 History of iron deficiency anemia   . History of migraine headaches   . Hyperlipidemia   . IBS (irritable bowel syndrome)   . Memory loss   . Migraine headache   . OA (osteoarthritis)    RIGHT SHOULDER  . 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 Right rotator cuff tear   . Unspecified essential hypertension      Past Surgical History:  Procedure Laterality Date  . BUNIONECTOMY/  HAMMERTOE CORRECTION  RIGHT FOOT  2011  . CATARACT EXTRACTION W/ INTRAOCULAR LENS  IMPLANT, BILATERAL    . KNEE ARTHROSCOPY W/ MENISCECTOMY Bilateral X2  LEFT /    X1  RIGHT  . REPLACEMENT TOTAL KNEE Left 2006  . SHOULDER ARTHROSCOPY WITH SUBACROMIAL DECOMPRESSION, ROTATOR CUFF REPAIR AND BICEP TENDON REPAIR Right 05/23/2013   Procedure: RIGHT SHOULDER ARTHROSCOPY EXAM UNDER ANESTHESIA  WITH SUBACROMIAL DECOMPRESSION,DISTAL CLAVICLE RESECTION, SADLABRAL DEBRIDEMENT CHONDROPLASTY, BICEP TENOTOMY ;  Surgeon: Sydnee Cabal, MD;  Location: [facility];  Service: Orthopedics;  Laterality: Right;  . TONSILLECTOMY AND ADENOIDECTOMY  AGE 13  . TOTAL KNEE ARTHROPLASTY Bilateral LEFT  1996/   RIGHT 2004  . VAGINAL HYSTERECTOMY  1976   Family History  Problem Relation Age of Onset  . Heart disease Father 62  . Hypertension Father   . Melanoma Mother   . Cancer Sister        skin CA, 2 sisters  . Esophageal cancer Brother   . Dementia Paternal Grandfather  Social History   Tobacco Use  . Smoking status: Former Smoker    Packs/day: 0.50    Years: 15.00    Pack years: 7.50    Types: Cigarettes    Last attempt to quit: 05/15/1985    Years since quitting: 31.8  . Smokeless tobacco: Never Used  Substance Use Topics  . Alcohol use: No    Comment: RECOVERING ALCOHOLIC--  QUIT [id number]  . Drug use: No   Current Outpatient Medications  Medication Sig Dispense Refill  . atorvastatin (LIPITOR) 20 MG tablet Take 1 tablet (20 mg total) by mouth daily. 90  tablet 3  . betamethasone dipropionate 0.05 % lotion Apply topically 2 (two) times daily. 60 mL 4  . buPROPion (WELLBUTRIN XL) 150 MG 24 hr tablet Take 1 tablet (150 mg total) by mouth daily. 90 tablet 1  . HYDROcodone-acetaminophen (NORCO/VICODIN) 5-325 MG tablet TAKE 1 TABLET OP EVERY 6 HOURS AS NEEDED  0  . L-Methylfolate-B12-B6-B2 (CEREFOLIN PO) Take by mouth.    . losartan (COZAAR) 50 MG tablet TAKE 1 TABLET(50 MG) BY MOUTH DAILY 90 tablet 2  . omeprazole (PRILOSEC) 40 MG capsule Take 1 capsule (40 mg total) by mouth daily. 30 capsule 3  . SUMAtriptan (IMITREX) 100 MG tablet Take 1 tablet (100 mg total) by mouth every 2 (two) hours as needed for migraine. May repeat in 2 hours if headache persists or recurs. 10 tablet 0  . traZODone (DESYREL) 50 MG tablet TAKE 1 TABLET(50 MG) BY MOUTH AT BEDTIME AS NEEDED FOR SLEEP 90 tablet 1   No current facility-administered medications for this visit.    Allergies  Allergen Reactions  . Penicillins Anaphylaxis  . Erythromycin Other (See Comments)    SEVERE STOMACH CRAMPS  . Morphine And Related Nausea And Vomiting  . Nsaids Other (See Comments)    SEVERE STOMACH CRAMPS **Able to tolerate Tylenol  . Nickel Rash     Review of Systems: All systems reviewed and negative except where noted in HPI.   Lab Results  Component Value Date   WBC 4.9 11/05/2016   HGB 12.4 11/05/2016   HCT 37.2 11/05/2016   MCV 98.4 11/05/2016   PLT 294.0 11/05/2016    Lab Results  Component Value Date   CREATININE 0.61 11/05/2016   BUN 13 11/05/2016   NA 140 11/05/2016   K 3.9 11/05/2016   CL 105 11/05/2016   CO2 29 11/05/2016    Lab Results  Component Value Date   ALT 15 11/05/2016   AST 15 11/05/2016   ALKPHOS 60 11/05/2016   BILITOT 0.7 11/05/2016     Physical Exam: BP (!) 150/90   Pulse 64  Constitutional: Pleasant,well-developed, female in no acute distress. HEENT: Normocephalic and atraumatic. Conjunctivae are normal. No scleral  icterus. Neck supple.  Cardiovascular: Normal rate, regular rhythm.  Pulmonary/chest: Effort normal and breath sounds normal. No wheezing, rales or rhonchi. Abdominal: Soft, nondistended, nontender.  There are no masses palpable. No hepatomegaly. Extremities: no edema Lymphadenopathy: No cervical adenopathy noted. Neurological: Alert and oriented to person place and time. Skin: Skin is warm and dry. No rashes noted. Psychiatric: Normal mood and affect. Behavior is normal.   ASSESSMENT AND PLAN: 72 year old female with a history of suspected ischemic colitis versus infectious colitis which has since resolved, now following up for the issues as outlined:  Fecal / urinary incontinence - it sounds like she has stress incontinence to both of these.  I suspect her pelvic floor is weak and that  she would benefit from pelvic floor physical therapy.  I discussed this option with her and will refer her for this.  I otherwise recommend a daily fiber supplement to help regulate her bowel movements, and help prevent leakage.  She agreed with the plan.  If her symptoms are not improved by this intervention I asked her to contact me for reassessment.  GERD / Dysphagia - GERD well-controlled on omeprazole, dysphasia resolved following dilation previously.  She can follow-up as needed for these issues.  History of colon polyps -she had around 10 small precancerous polyps, recommended recall within the next year or so.  She agreed.  Crandon Lakes Cellar, MD [facility] Pager [phone]

## 2017-04-01 DIAGNOSIS — M25552 Pain in left hip: Secondary | ICD-10-CM | POA: Diagnosis not present

## 2017-04-01 DIAGNOSIS — M1612 Unilateral primary osteoarthritis, left hip: Secondary | ICD-10-CM | POA: Diagnosis not present

## 2017-04-06 ENCOUNTER — Ambulatory Visit: Payer: Medicare Other | Admitting: Physical Therapy

## 2017-04-07 ENCOUNTER — Emergency Department (HOSPITAL_COMMUNITY): Payer: Medicare Other

## 2017-04-07 ENCOUNTER — Encounter (HOSPITAL_COMMUNITY): Payer: Self-pay | Admitting: Emergency Medicine

## 2017-04-07 ENCOUNTER — Emergency Department (HOSPITAL_COMMUNITY)
Admission: EM | Admit: 2017-04-07 | Discharge: 2017-04-07 | Disposition: A | Discharge status: Home / Self Care | Payer: Medicare Other | Attending: Emergency Medicine | Admitting: Emergency Medicine

## 2017-04-07 DIAGNOSIS — Z87891 Personal history of nicotine dependence: Secondary | ICD-10-CM | POA: Diagnosis not present

## 2017-04-07 DIAGNOSIS — Y92009 Unspecified place in unspecified non-institutional (private) residence as the place of occurrence of the external cause: Secondary | ICD-10-CM | POA: Insufficient documentation

## 2017-04-07 DIAGNOSIS — I10 Essential (primary) hypertension: Secondary | ICD-10-CM | POA: Insufficient documentation

## 2017-04-07 DIAGNOSIS — S2242XA Multiple fractures of ribs, left side, initial encounter for closed fracture: Secondary | ICD-10-CM | POA: Insufficient documentation

## 2017-04-07 DIAGNOSIS — R0789 Other chest pain: Secondary | ICD-10-CM | POA: Diagnosis not present

## 2017-04-07 DIAGNOSIS — Y93K1 Activity, walking an animal: Secondary | ICD-10-CM | POA: Insufficient documentation

## 2017-04-07 DIAGNOSIS — R0781 Pleurodynia: Secondary | ICD-10-CM | POA: Diagnosis not present

## 2017-04-07 DIAGNOSIS — Y999 Unspecified external cause status: Secondary | ICD-10-CM | POA: Insufficient documentation

## 2017-04-07 DIAGNOSIS — W109XXA Fall (on) (from) unspecified stairs and steps, initial encounter: Secondary | ICD-10-CM | POA: Insufficient documentation

## 2017-04-07 DIAGNOSIS — Z79899 Other long term (current) drug therapy: Secondary | ICD-10-CM | POA: Diagnosis not present

## 2017-04-07 DIAGNOSIS — S299XXA Unspecified injury of thorax, initial encounter: Secondary | ICD-10-CM | POA: Diagnosis not present

## 2017-04-07 MED ORDER — HYDROMORPHONE HCL 1 MG/ML IJ SOLN
0.2500 mg | Freq: Once | INTRAMUSCULAR | Status: AC
  Administered 2017-04-07: 0.25 mg via INTRAMUSCULAR
  Filled 2017-04-07: qty 1

## 2017-04-07 MED ORDER — HYDROCODONE-ACETAMINOPHEN 5-325 MG PO TABS: 1.0000 | ORAL_TABLET | Freq: Four times a day (QID) | ORAL | 0 refills | Status: DC | PRN

## 2017-04-07 NOTE — ED Notes (Signed)
Patient transported to X-ray 

## 2017-04-07 NOTE — ED Triage Notes (Signed)
Patient here from home with complaints of chest discomfort on left side after fall in garage last Friday. Reports pain increasingly worse with difficulty breathing.

## 2017-04-07 NOTE — ED Provider Notes (Signed)
[facility] DEPT Provider Note   CSN: [id number] Arrival date & time: 04/07/17  1126     History   Chief Complaint Chief Complaint  Patient presents with  . Fall  . Rib Injury    HPI Tonya Bartlett is a 72 y.o. female.  HPI Patient presents with chest pain after fall.  States that 5 days ago she was walking down the stairs with her dogs and fell forward landing on her chest.  Has had pain in her chest since then.  Also had pain somewhat diffusely but that has resolved.  Pain worse with movement and bending forward in her chest.  No difficulty breathing.  No fevers.  No abdominal pain.  No head or neck pain.  No consistent relief with ibuprofen or Norco. Past Medical History:  Diagnosis Date  . Allergic rhinitis   . Colitis   . Depression   . Diverticulosis   . Frequency of urination   . GERD (gastroesophageal reflux disease)   . 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 History of iron deficiency anemia   . History of migraine headaches   . Hyperlipidemia   . IBS (irritable bowel syndrome)   . Memory loss   . Migraine headache   . OA (osteoarthritis)    RIGHT SHOULDER  . 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 Right rotator cuff tear   . Unspecified essential hypertension     Patient Active Problem List   Diagnosis Date Noted  . MCI (mild cognitive impairment) 09/19/2015  . Memory deficit 09/19/2015  . Psoriasis of scalp 11/06/2014  . Left knee pain 11/06/2014  . Sepsis ([place]) 10/29/2014  . SIRS (systemic inflammatory response syndrome) ([place]) 10/29/2014  . Fever 10/29/2014  . Joint pain 10/29/2014  . Serum sickness 10/29/2014  . Arthralgia   . 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 Depression (emotion) 11/16/2013  . S/P arthroscopy of shoulder 05/23/2013  . ADD (attention deficit disorder) 11/17/2011  . Essential hypertension, benign 10/15/2009  . SEBORRHEIC KERATOSIS,  INFLAMED 10/17/2008  . CARPAL TUNNEL SYNDROME, LEFT 06/22/2008  . Allergic rhinitis 08/30/2007  . Severe obesity (BMI >= 40) ([place]) 03/31/2007  . Hyperlipidemia 11/11/2006  . Migraine 11/11/2006  . Osteoarthritis 11/11/2006    Past Surgical History:  Procedure Laterality Date  . BUNIONECTOMY/  HAMMERTOE CORRECTION  RIGHT FOOT  2011  . CATARACT EXTRACTION W/ INTRAOCULAR LENS  IMPLANT, BILATERAL    . KNEE ARTHROSCOPY W/ MENISCECTOMY Bilateral X2  LEFT /    X1  RIGHT  . REPLACEMENT TOTAL KNEE Left 2006  . SHOULDER ARTHROSCOPY WITH SUBACROMIAL DECOMPRESSION, ROTATOR CUFF REPAIR AND BICEP TENDON REPAIR Right 05/23/2013   Procedure: RIGHT SHOULDER ARTHROSCOPY EXAM UNDER ANESTHESIA  WITH SUBACROMIAL DECOMPRESSION,DISTAL CLAVICLE RESECTION, SADLABRAL DEBRIDEMENT CHONDROPLASTY, BICEP TENOTOMY ;  Surgeon: Sydnee Cabal, MD;  Location: [facility];  Service: Orthopedics;  Laterality: Right;  . TONSILLECTOMY AND ADENOIDECTOMY  AGE 2  . TOTAL KNEE ARTHROPLASTY Bilateral LEFT  1996/   RIGHT 2004  . VAGINAL HYSTERECTOMY  1976    OB History    No data available       Home Medications    Prior to Admission medications   Medication Sig Start Date End Date Taking? Authorizing Provider  atorvastatin (LIPITOR) 20 MG tablet Take 1 tablet (20 mg total) by mouth daily. 11/05/16  Yes Nafziger, Tommi Rumps, NP  betamethasone dipropionate  0.05 % lotion Apply topically 2 (two) times daily. 08/30/15  Yes Nafziger, Tommi Rumps, NP  HYDROcodone-acetaminophen (NORCO/VICODIN) 5-325 MG tablet TAKE 1 TABLET OP EVERY 6 HOURS AS NEEDED 02/22/17  Yes [provider]  losartan (COZAAR) 50 MG tablet TAKE 1 TABLET(50 MG) BY MOUTH DAILY 03/10/17  Yes Nafziger, Tommi Rumps, NP  omeprazole (PRILOSEC) 40 MG capsule Take 1 capsule (40 mg total) by mouth daily. 12/09/16  Yes Laurey Morale, MD  sertraline (ZOLOFT) 50 MG tablet Take 50 mg by mouth every other day.   Yes [provider]  SUMAtriptan (IMITREX) 100 MG  tablet Take 1 tablet (100 mg total) by mouth every 2 (two) hours as needed for migraine. May repeat in 2 hours if headache persists or recurs. 04/01/16  Yes Nafziger, Tommi Rumps, NP  traZODone (DESYREL) 50 MG tablet TAKE 1 TABLET(50 MG) BY MOUTH AT BEDTIME AS NEEDED FOR SLEEP 11/11/16  Yes Nafziger, Tommi Rumps, NP  buPROPion (WELLBUTRIN XL) 150 MG 24 hr tablet Take 1 tablet (150 mg total) by mouth daily. Patient not taking: Reported on 04/07/2017 03/25/17   [name], NP  HYDROcodone-acetaminophen (NORCO/VICODIN) 5-325 MG tablet Take 1-2 tablets by mouth every 6 (six) hours as needed. 04/07/17   Davonna Belling, MD    Family History Family History  Problem Relation Age of Onset  . Heart disease Father 18  . Hypertension Father   . Melanoma Mother   . Cancer Sister        skin CA, 2 sisters  . Esophageal cancer Brother   . Dementia Paternal Grandfather     Social History Social History   Tobacco Use  . Smoking status: Former Smoker    Packs/day: 0.50    Years: 15.00    Pack years: 7.50    Types: Cigarettes    Last attempt to quit: 05/15/1985    Years since quitting: 31.9  . Smokeless tobacco: Never Used  Substance Use Topics  . Alcohol use: No    Comment: RECOVERING ALCOHOLIC--  QUIT [id number]  . Drug use: No     Allergies   Penicillins; Erythromycin; Morphine and related; Nsaids; and Nickel   Review of Systems Review of Systems  Constitutional: Negative for chills.  HENT: Negative for congestion.   Respiratory: Negative for shortness of breath.   Cardiovascular: Positive for chest pain. Negative for palpitations.  Gastrointestinal: Negative for abdominal pain.  Genitourinary: Negative for dyspareunia and urgency.  Musculoskeletal: Negative for back pain.  Skin: Negative for rash.  Neurological: Negative for tremors.  Hematological: Negative for adenopathy.  Psychiatric/Behavioral: Negative for confusion.     Physical Exam Updated Vital Signs BP (!) 175/125 (BP  Location: Left Arm)   Pulse 62   Temp 97.6 F (36.4 C) (Oral)   Resp 16   Ht 5' 6.5" (1.689 m)   Wt 104.3 kg (230 lb)   SpO2 98%   BMI 36.57 kg/m   Physical Exam  Constitutional: She appears well-developed.  HENT:  Head: Atraumatic.  Eyes: Pupils are equal, round, and reactive to light.  Neck: Neck supple.  Cardiovascular: Normal rate.  Pulmonary/Chest: She exhibits tenderness.  Chest tenderness over left anterior parasternal and anterior chest.  No crepitance or deformity.  No rash.  Abdominal: Soft. There is no tenderness.  Musculoskeletal: She exhibits no edema.  Neurological: She is alert.  Skin: Skin is warm. Capillary refill takes less than 2 seconds.     ED Treatments / Results  Labs (all labs ordered are listed, but only abnormal  results are displayed) Labs Reviewed - No data to display  EKG  EKG Interpretation  Date/Time:  Wednesday April 07 2017 11:35:05 EST Ventricular Rate:  63 PR Interval:    QRS Duration: 100 QT Interval:  425 QTC Calculation: 435 R Axis:   10 Text Interpretation:  Sinus rhythm Abnormal R-wave progression, early transition Confirmed by Davonna Belling [phone]) on 04/07/2017 12:19:07 PM       Radiology Dg Ribs Unilateral W/chest Left  Result Date: 04/07/2017 CLINICAL DATA:  Pain following recent fall EXAM: LEFT RIBS AND CHEST - 3+ VIEW COMPARISON:  February 01, 2016 FINDINGS: Frontal chest as well as oblique and cone-down rib images obtained. There is a small calcified granuloma in the medial right upper lobe. No edema or consolidation. Heart size and pulmonary vascularity are normal. No adenopathy. There is a small calcified aortopulmonary window lymph node. No pleural effusion or pneumothorax. No evident rib fracture. There is postoperative change in the right shoulder. IMPRESSION: No evident rib fracture. Evidence of prior granulomatous disease. No edema or consolidation. Electronically Signed   By: Lowella Grip III M.D.   On:  04/07/2017 12:21   Ct Chest Wo Contrast  Result Date: 04/07/2017 CLINICAL DATA:  [place] in the garage 5 days ago striking the left side of the chest with worsening pain and difficulty breathing. Negative radiographs. EXAM: CT CHEST WITHOUT CONTRAST TECHNIQUE: Multidetector CT imaging of the chest was performed following the standard protocol without IV contrast. COMPARISON:  Radiography same day.  CT chest 10/14/2005. FINDINGS: Cardiovascular: Aortic atherosclerosis. Diameter of the ascending aorta up to 4.1 cm. No coronary artery calcification is seen. Heart size is normal. Small amount of pericardial fluid in the superior recess, not significant. Mediastinum/Nodes: No mass or lymphadenopathy. Lungs/Pleura: Scattered areas of pulmonary scarring. No sign of active infiltrate, mass, effusion or collapse. No pneumothorax. Upper Abdomen: Normal Musculoskeletal: No spinal injury. Ordinary vertebral endplate osteophytes. Mild thoracic spinal curvature convex to the right. Evaluation of the ribs shows nondisplaced fractures the anterolateral aspects of the second, third and fourth ribs. No associated hematoma. IMPRESSION: Nondisplaced fractures of the anterolateral second, third and fourth ribs. No chest wall hematoma. No pneumothorax or hemothorax. Lungs clear. Aortic Atherosclerosis (ICD10-I70.0). Electronically Signed   By: Nelson Chimes M.D.   On: 04/07/2017 13:23    Procedures Procedures (including critical care time)  Medications Ordered in ED Medications  HYDROmorphone (DILAUDID) injection 0.25 mg (0.25 mg Intramuscular Given 04/07/17 1448)     Initial Impression / Assessment and Plan / ED Course  I have reviewed the triage vital signs and the nursing notes.  Pertinent labs & imaging results that were available during my care of the patient were reviewed by me and considered in my medical decision making (see chart for details).     Patient with left upper chest pain after fall.  CT shows 3 rib  fractures without underlying pneumothorax.  Will use Norco for pain control.  Discussed with Dr. Donne Hazel from trauma surgery.  Thinks the patient can follow-up with her primary care doctor since she is 4 days out and has not decompensated.  Will have patient follow-up with PCP as needed.  Given incentive spirometry.  Final Clinical Impressions(s) / ED Diagnoses   Final diagnoses:  Closed fracture of multiple ribs of left side, initial encounter    ED Discharge Orders        Ordered    HYDROcodone-acetaminophen (NORCO/VICODIN) 5-325 MG tablet  Every 6 hours PRN  04/07/17 1455       Davonna Belling, MD 04/07/17 1530

## 2017-04-09 ENCOUNTER — Encounter: Payer: Self-pay | Admitting: Adult Health

## 2017-04-09 ENCOUNTER — Ambulatory Visit (INDEPENDENT_AMBULATORY_CARE_PROVIDER_SITE_OTHER): Payer: Medicare Other | Admitting: Adult Health

## 2017-04-09 VITALS — BP 132/84 | HR 119 | Temp 98.4°F

## 2017-04-09 DIAGNOSIS — R0781 Pleurodynia: Secondary | ICD-10-CM

## 2017-04-09 MED ORDER — KETOROLAC TROMETHAMINE 60 MG/2ML IM SOLN
60.0000 mg | Freq: Once | INTRAMUSCULAR | Status: AC
  Administered 2017-04-09: 60 mg via INTRAMUSCULAR

## 2017-04-09 NOTE — Progress Notes (Signed)
Subjective:    Patient ID: Tonya Bartlett, female    DOB: Oct 12, 1945, 72 y.o.   MRN: [id number]  HPI  72 year old female who  has a past medical history of Allergic rhinitis, Colitis, Depression, Diverticulosis, Frequency of urination, GERD (gastroesophageal reflux disease), History of DVT of lower extremity, History of iron deficiency anemia, History of migraine headaches, Hyperlipidemia, IBS (irritable bowel syndrome), Memory loss, Migraine headache, OA (osteoarthritis), Recovering alcoholic ([place]), Right rotator cuff tear, and Unspecified essential hypertension.   She presents to the office today for follow up after being seen in the ER after a mechanical fall at home. She reports falling down the stairs in her garage when she got tangled up in the dog leash. She landed on her chest.   Xray was negative for acute fracture but   CT showed showed three rib fractures without pheumothroax. She was prescribed Norco for pain. She reports that her pain is not controlled. She threw up the last time she tried to take Norco. Has been using heating pad and incentive spirometer.   She denies any shortness of breath or difficulty breathing. Has not noticed any fevers, or head/neck pain.    Review of Systems See HPI   Past Medical History:  Diagnosis Date  . Allergic rhinitis   . Colitis   . Depression   . Diverticulosis   . Frequency of urination   . GERD (gastroesophageal reflux disease)   . 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 History of iron deficiency anemia   . History of migraine headaches   . Hyperlipidemia   . IBS (irritable bowel syndrome)   . Memory loss   . Migraine headache   . OA (osteoarthritis)    RIGHT SHOULDER  . Recovering alcoholic ([place])    SINCE [id number]  . Right rotator cuff tear   . Unspecified essential hypertension     Social History   Socioeconomic History  . Marital status: Widowed    Spouse name: Not on file  . Number  of children: Not on file  . Years of education: Not on file  . Highest education level: Not on file  Social Needs  . Financial resource strain: Not on file  . Food insecurity - worry: Not on file  . Food insecurity - inability: Not on file  . Transportation needs - medical: Not on file  . Transportation needs - non-medical: Not on file  Occupational History  . Not on file  Tobacco Use  . Smoking status: Former Smoker    Packs/day: 0.50    Years: 15.00    Pack years: 7.50    Types: Cigarettes    Last attempt to quit: 05/15/1985    Years since quitting: 31.9  . Smokeless tobacco: Never Used  Substance and Sexual Activity  . Alcohol use: No    Comment: RECOVERING ALCOHOLIC--  QUIT [id number]  . Drug use: No  . Sexual activity: No  Other Topics Concern  . 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 BUNIONECTOMY/  HAMMERTOE CORRECTION  RIGHT FOOT  2011  . CATARACT EXTRACTION W/ INTRAOCULAR LENS  IMPLANT, BILATERAL    . KNEE ARTHROSCOPY W/ MENISCECTOMY  Bilateral X2  LEFT /    X1  RIGHT  . REPLACEMENT TOTAL KNEE Left 2006  . SHOULDER ARTHROSCOPY WITH SUBACROMIAL DECOMPRESSION, ROTATOR CUFF REPAIR AND BICEP TENDON REPAIR Right 05/23/2013   Procedure: RIGHT SHOULDER ARTHROSCOPY EXAM UNDER ANESTHESIA  WITH SUBACROMIAL DECOMPRESSION,DISTAL CLAVICLE RESECTION, SADLABRAL DEBRIDEMENT CHONDROPLASTY, BICEP TENOTOMY ;  Surgeon: Sydnee Cabal, MD;  Location: [facility];  Service: Orthopedics;  Laterality: Right;  . TONSILLECTOMY AND ADENOIDECTOMY  AGE 29  . TOTAL KNEE ARTHROPLASTY Bilateral LEFT  1996/   RIGHT 2004  . VAGINAL HYSTERECTOMY  1976    Family History  Problem Relation Age of Onset  . Heart disease Father 21  . Hypertension Father   . Melanoma Mother   . Cancer Sister        skin  CA, 2 sisters  . Esophageal cancer Brother   . Dementia Paternal Grandfather     Allergies  Allergen Reactions  . Penicillins Anaphylaxis    Has patient had a PCN reaction causing immediate rash, facial/tongue/throat swelling, SOB or lightheadedness with hypotension: Yes Has patient had a PCN reaction causing severe rash involving mucus membranes or skin necrosis: Yes Has patient had a PCN reaction that required hospitalization: No Has patient had a PCN reaction occurring within the last 10 year No If all of the above answers are "NO", then may proceed with Cephalosporin use.   . Erythromycin Other (See Comments)    SEVERE STOMACH CRAMPS  . Morphine And Related Nausea And Vomiting  . Nsaids Other (See Comments)    SEVERE STOMACH CRAMPS **Able to tolerate Tylenol  . Nickel Rash    Current Outpatient Medications on File Prior to Visit  Medication Sig Dispense Refill  . atorvastatin (LIPITOR) 20 MG tablet Take 1 tablet (20 mg total) by mouth daily. 90 tablet 3  . betamethasone dipropionate 0.05 % lotion Apply topically 2 (two) times daily. 60 mL 4  . buPROPion (WELLBUTRIN XL) 150 MG 24 hr tablet Take 1 tablet (150 mg total) by mouth daily. 90 tablet 1  . HYDROcodone-acetaminophen (NORCO/VICODIN) 5-325 MG tablet TAKE 1 TABLET OP EVERY 6 HOURS AS NEEDED  0  . HYDROcodone-acetaminophen (NORCO/VICODIN) 5-325 MG tablet Take 1-2 tablets by mouth every 6 (six) hours as needed. 15 tablet 0  . losartan (COZAAR) 50 MG tablet TAKE 1 TABLET(50 MG) BY MOUTH DAILY 90 tablet 2  . omeprazole (PRILOSEC) 40 MG capsule Take 1 capsule (40 mg total) by mouth daily. 30 capsule 3  . sertraline (ZOLOFT) 50 MG tablet Take 50 mg by mouth every other day.    . SUMAtriptan (IMITREX) 100 MG tablet Take 1 tablet (100 mg total) by mouth every 2 (two) hours as needed for migraine. May repeat in 2 hours if headache persists or recurs. 10 tablet 0  . traZODone (DESYREL) 50 MG tablet TAKE 1 TABLET(50 MG) BY MOUTH AT  BEDTIME AS NEEDED FOR SLEEP 90 tablet 1   No current facility-administered medications on file prior to visit.     BP 132/84   Pulse (!) 119   Temp 98.4 F (36.9 C) (Oral)   SpO2 98%       Objective:   Physical Exam  Constitutional: She is oriented to person, place, and time. She appears well-developed and well-nourished. No distress.  Cardiovascular: Normal rate, regular rhythm, normal heart sounds and intact distal pulses. Exam reveals no gallop and no friction rub.  No murmur heard. Pulmonary/Chest: Effort normal and breath sounds normal. No respiratory distress.  She has no wheezes. She has no rales. She exhibits no tenderness.  Neurological: She is alert and oriented to person, place, and time.  Skin: She is not diaphoretic.  Bruising noted on bilateral knees. No bruising on chest   Psychiatric: She has a normal mood and affect. Her behavior is normal. Judgment and thought content normal.  Nursing note and vitals reviewed.         Assessment & Plan:  1. Rib pain - ketorolac (TORADOL) injection 60 mg - Take pain medication with food and can take zofran ( which she has at home) - Continue with incentive spirometer  - Follow up as needed  [name], NP

## 2017-04-09 NOTE — Patient Instructions (Signed)
I have given you a Toradol shot, this is an anti inflammatory   You can take 600 mg Motrin every 6-8 hours PRN   If you take [place], take zofran 30 minutes before and eat prior to taking the medication

## 2017-04-14 ENCOUNTER — Other Ambulatory Visit: Payer: Self-pay

## 2017-04-14 ENCOUNTER — Encounter: Payer: Self-pay | Admitting: Physical Therapy

## 2017-04-14 ENCOUNTER — Ambulatory Visit: Payer: Medicare Other | Attending: Gastroenterology | Admitting: Physical Therapy

## 2017-04-14 DIAGNOSIS — M6281 Muscle weakness (generalized): Secondary | ICD-10-CM | POA: Diagnosis not present

## 2017-04-14 DIAGNOSIS — R278 Other lack of coordination: Secondary | ICD-10-CM | POA: Diagnosis not present

## 2017-04-14 NOTE — Therapy (Signed)
[facility] [address] [place], [place], [address] Phone: [phone]   Fax:  [phone]  Physical Therapy Evaluation  Patient Details  Name: Tonya Bartlett MRN: [id number] Date of Birth: [birth year] Referring Provider: Dr. Frances Nickels   Encounter Date: 04/14/2017  PT End of Session - 04/14/17 1611    Visit Number  1    Date for PT Re-Evaluation  06/09/17    Authorization Type  Medicare    PT Start Time  1100    PT Stop Time  1145    PT Time Calculation (min)  45 min    Activity Tolerance  Patient tolerated treatment well    Behavior During Therapy  [facility] for tasks assessed/performed       Past Medical History:  Diagnosis Date  . Allergic rhinitis   . Colitis   . Depression   . Diverticulosis   . Frequency of urination   . GERD (gastroesophageal reflux disease)   . 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 History of iron deficiency anemia   . History of migraine headaches   . Hyperlipidemia   . IBS (irritable bowel syndrome)   . Memory loss   . Migraine headache   . OA (osteoarthritis)    RIGHT SHOULDER  . Recovering alcoholic ([place])    SINCE [id number]  . Right rotator cuff tear   . Unspecified essential hypertension     Past Surgical History:  Procedure Laterality Date  . BUNIONECTOMY/  HAMMERTOE CORRECTION  RIGHT FOOT  2011  . CATARACT EXTRACTION W/ INTRAOCULAR LENS  IMPLANT, BILATERAL    . KNEE ARTHROSCOPY W/ MENISCECTOMY Bilateral X2  LEFT /    X1  RIGHT  . REPLACEMENT TOTAL KNEE Left 2006  . SHOULDER ARTHROSCOPY WITH SUBACROMIAL DECOMPRESSION, ROTATOR CUFF REPAIR AND BICEP TENDON REPAIR Right 05/23/2013   Procedure: RIGHT SHOULDER ARTHROSCOPY EXAM UNDER ANESTHESIA  WITH SUBACROMIAL DECOMPRESSION,DISTAL CLAVICLE RESECTION, SADLABRAL DEBRIDEMENT CHONDROPLASTY, BICEP TENOTOMY ;  Surgeon: Sydnee Cabal, MD;  Location: [facility];  Service:  Orthopedics;  Laterality: Right;  . TONSILLECTOMY AND ADENOIDECTOMY  AGE 72  . TOTAL KNEE ARTHROPLASTY Bilateral LEFT  1996/   RIGHT 2004  . VAGINAL HYSTERECTOMY  1976    There were no vitals filed for this visit.   Subjective Assessment - 04/14/17 1107    Subjective  Patient has been losing bowel movements and urine when she does not want to. This started when she had a colonoscopy 2 times. She is taking a fiber pill that is helping her bowel movements.  When she thinks about urination and she has to rush to the toilet.     Patient Stated Goals  reduce the urinary and fecal leakage    Currently in Pain?  No/denies         [facility] PT Assessment - 04/14/17 0001      Assessment   Medical Diagnosis  R15.9 incontinence of feces, unspecified fecal incontinence; R32 Urinary incontinence, unspecified type    Referring Provider  Dr. Frances Nickels    Onset Date/Surgical Date  03/19/17    Prior Therapy  none      Precautions   Precautions  None      Restrictions   Weight Bearing Restrictions  No      Balance Screen   Has the patient fallen in the past 6 months  No      Home Environment  Living Environment  Private residence    Living Arrangements  Alone      Prior Function   Level of Independence  Independent    Vocation  Part time employment    Vocation Requirements  sitting      Cognition   Overall Cognitive Status  Within Functional Limits for tasks assessed    Memory  Impaired    Memory Impairment  Decreased short term memory      Observation/Other Assessments   Focus on Therapeutic Outcomes (FOTO)   therapist discretion limited by 40% due to leakage       Posture/Postural Control   Posture/Postural Control  No significant limitations      ROM / Strength   AROM / PROM / Strength  AROM;PROM;Strength      AROM   Overall AROM   Within functional limits for tasks performed      PROM   Overall PROM   Within functional limits for tasks performed      Strength    Right Hip Flexion  4/5    Left Hip Flexion  4/5      Palpation   SI assessment   pelvic is in correct alignment      Transfers   Transfers  Not assessed      Ambulation/Gait   Ambulation/Gait  No             Objective measurements completed on examination: See above findings.    Pelvic Floor Special Questions - 04/14/17 0001    Urinary Leakage  Yes    Activities that cause leaking  Coughing;Sneezing;Laughing;With strong urge    Urinary urgency  Yes every hour in morning or 15 min, afternoon 2 hours    Caffeine beverages  1 cup per morning tea, water, diet coke    Skin Integrity  Erthema on inner thighs due to wearing pads    Prolapse  None    Pelvic Floor Internal Exam  Patient confirms identification and approves PT to assess muscles and treatment    Exam Type  Vaginal    Palpation  tenderness located in bil. obturator internist, and levator ani    Strength  weak squeeze, no lift many VC               PT Education - 04/14/17 1611    Education provided  Yes    Education Details  abdominal massage and bladder irritants    Person(s) Educated  Patient    Methods  Explanation;Demonstration;Verbal cues;Handout    Comprehension  Verbalized understanding;Returned demonstration       PT Short Term Goals - 04/14/17 1624      PT SHORT TERM GOAL #1   Title  independent wtih intial HEP    Time  4    Period  Weeks    Status  New    Target Date  05/12/17      PT SHORT TERM GOAL #2   Title  understand how to toilet correctly to relax and push the bowel out correctly    Time  4    Period  Weeks    Target Date  05/12/17      PT SHORT TERM GOAL #3   Title  understand how to perform abdominal massage to assist in peristalic movement of the intestines    Time  8    Period  Weeks    Status  New    Target Date  06/09/17  PT Long Term Goals - 04/14/17 1622      PT LONG TERM GOAL #1   Title  independent with HEP and understand how to progress herself     Time  8    Period  Weeks    Status  New    Target Date  06/09/17      PT LONG TERM GOAL #2   Title  urinary leakage with sneezing, coughing and laughing decreased >/= 75% due to increased pelvic floor coordination    Time  8    Period  Weeks    Status  New    Target Date  06/09/17      PT LONG TERM GOAL #3   Title  urge to void improved to 5-10 minutes so she is able to slowly walk to the bathroom since she is at risk of falls    Time  8    Period  Weeks    Status  New    Target Date  06/09/17      PT LONG TERM GOAL #4   Title  fecal leakage decreaased >/= 75% due to improved pelvic floor strength and coordination    Time  8    Period  Weeks    Target Date  06/09/17             Plan - 04/14/17 1612    Clinical Impression Statement  Patient is a 72 year old female with urinary and fecal incontinence for the past year after she has had her last colonoscopy.  Patient reports no pain.  Patient reports she fell 2 weeks ago braking 2  upper ribs.  Patient would forget thinks several times during  evaluation.  Patient reports she has difficulty with short term memory.  Patient reports she has 2-3 bowel movements per mornins.  She is taking a fiber pill daily that has reduce the fecal leakage.  Patient reports urinary leakage with coughing, sneezing and when she has the urge to void.  Pelvic floor strength is 2/5 when she is able to coordinate the contraction vaginally and not bear down.  Patient requires many verbal and tactile cues to contract the pelvic floor.  Patient has increase tightness in the  vaginal sphincter, bulbocavernosus, urethra sphincter and tenderness located in the obturator internist and levator ani.  Patient will urinate every 15 minutes in the morning and every 2 hours in the afternoon.  Patient drinks coffee, tea and diet soda all day which are bladder irritants.  Patient will benefit from skilled therapy to work on pelvic floor coordination to reduce urinary  leakage and improve constipation.     History and Personal Factors relevant to plan of care:  collitis, Bil. knee replacement, vaginal hysterectomy, depression    Clinical Presentation  Evolving    Clinical Presentation due to:  urinary leakage  during the day and pads cause skin irritation on the inner thighs; patient has difficulty with short term memory    Clinical Decision Making  Moderate    Rehab Potential  Excellent    Clinical Impairments Affecting Rehab Potential  collitis, Bil. knee replacement, vaginal hysterectomy, depression ;Patient will be having a left THR on 04/26/2017    PT Frequency  2x / week    PT Duration  8 weeks    PT Treatment/Interventions  Biofeedback;Therapeutic activities;Therapeutic exercise;Neuromuscular re-education;Patient/family education;Manual techniques    PT Next Visit Plan  pelvic floor contraction, diaphragmatic breathing, toileting technique, soft tissue work    PT  Home Exercise Plan  progress as needed    Consulted and Agree with Plan of Care  Patient       Patient will benefit from skilled therapeutic intervention in order to improve the following deficits and impairments:  Increased fascial restricitons, Increased muscle spasms, Decreased strength, Decreased endurance, Decreased coordination  Visit Diagnosis: Muscle weakness (generalized) - Plan: PT plan of care cert/re-cert  Other lack of coordination - Plan: PT plan of care cert/re-cert     Problem List Patient Active Problem List   Diagnosis Date Noted  . MCI (mild cognitive impairment) 09/19/2015  . Memory deficit 09/19/2015  . Psoriasis of scalp 11/06/2014  . Left knee pain 11/06/2014  . Sepsis ([place]) 10/29/2014  . SIRS (systemic inflammatory response syndrome) ([place]) 10/29/2014  . Fever 10/29/2014  . Joint pain 10/29/2014  . Serum sickness 10/29/2014  . Arthralgia   . 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 Depression (emotion) 11/16/2013  . S/P arthroscopy of  shoulder 05/23/2013  . ADD (attention deficit disorder) 11/17/2011  . Essential hypertension, benign 10/15/2009  . SEBORRHEIC KERATOSIS, INFLAMED 10/17/2008  . CARPAL TUNNEL SYNDROME, LEFT 06/22/2008  . Allergic rhinitis 08/30/2007  . Severe obesity (BMI >= 40) ([place]) 03/31/2007  . Hyperlipidemia 11/11/2006  . Migraine 11/11/2006  . Osteoarthritis 11/11/2006    Earlie Counts, PT 04/14/17 4:29 PM   [facility] [address] [place], [place], [address] Phone: [phone]   Fax:  [phone]  Name: CALLAWAY HARDIGREE MRN: [id number] Date of Birth: 1945-11-26

## 2017-04-14 NOTE — Patient Instructions (Addendum)
About Abdominal Massage  Abdominal massage, also called external colon massage, is a self-treatment circular massage technique that can reduce and eliminate gas and ease constipation. The colon naturally contracts in waves in a clockwise direction starting from inside the right hip, moving up toward the ribs, across the belly, and down inside the left hip.  When you perform circular abdominal massage, you help stimulate your colon's normal wave pattern of movement called peristalsis.  It is most beneficial when done after eating.  Positioning You can practice abdominal massage with oil while lying down, or in the shower with soap.  Some people find that it is just as effective to do the massage through clothing while sitting or standing.  How to Massage Start by placing your finger tips or knuckles on your right side, just inside your hip bone.  . Make small circular movements while you move upward toward your rib cage.   . Once you reach the bottom right side of your rib cage, take your circular movements across to the left side of the bottom of your rib cage.  . Next, move downward until you reach the inside of your left hip bone.  This is the path your feces travel in your colon. . Continue to perform your abdominal massage in this pattern for 10 minutes each day.     You can apply as much pressure as is comfortable in your massage.  Start gently and build pressure as you continue to practice.  Notice any areas of pain as you massage; areas of slight pain may be relieved as you massage, but if you have areas of significant or intense pain, consult with your healthcare provider.  Other Considerations . General physical activity including bending and stretching can have a beneficial massage-like effect on the colon.  Deep breathing can also stimulate the colon because breathing deeply activates the same nervous system that supplies the colon.   . Abdominal massage should always be used in  combination with a bowel-conscious diet that is high in the proper type of fiber for you, fluids (primarily water), and a regular exercise program.  Certain foods and liquids will decrease the pH making the urine more acidic.  Urinary urgency increases when the urine has a low pH.  Most common irritants: alcohol, carbonated beverages and caffinated beverages.  Foods to avoid: apple juice, apples, ascorbic acid, canteloupes, chili, citrus fruits, coffee, cranberries, grapes, guava, peaches, pepper, pineapple, plums, strawberries, tea, tomatoes, and vinegar.  Drinking plenty of water may help to increase the pH and dilute out any of the effects of specific irritants.  Foods that are NOT irritating to the bladder include: Pears, papayas, sun-brewed teas, watermelons, non-citrus herbal teas, apricots, kava and low-acid instant drinks ([employer])   [facility] [address] [place], [place] [address] Phone # [phone] Fax [phone]

## 2017-04-15 ENCOUNTER — Other Ambulatory Visit: Payer: Self-pay | Admitting: *Deleted

## 2017-04-15 MED ORDER — CEREFOLIN 6-1-50-5 MG PO TABS: ORAL_TABLET | ORAL | 3 refills | Status: DC

## 2017-04-16 ENCOUNTER — Telehealth: Payer: Self-pay | Admitting: Physical Therapy

## 2017-04-16 ENCOUNTER — Ambulatory Visit: Payer: Medicare Other | Attending: Gastroenterology | Admitting: Physical Therapy

## 2017-04-16 DIAGNOSIS — M6281 Muscle weakness (generalized): Secondary | ICD-10-CM | POA: Insufficient documentation

## 2017-04-16 DIAGNOSIS — R278 Other lack of coordination: Secondary | ICD-10-CM | POA: Insufficient documentation

## 2017-04-16 NOTE — Telephone Encounter (Signed)
Patient took a pain pill for her rib pain and went back to sleep so she missed her appointment.   Earlie Counts, PT @3 /02/2017@ 9:14 AM

## 2017-04-20 ENCOUNTER — Ambulatory Visit: Payer: Medicare Other | Admitting: Physical Therapy

## 2017-04-20 ENCOUNTER — Encounter: Payer: Self-pay | Admitting: Physical Therapy

## 2017-04-20 DIAGNOSIS — M6281 Muscle weakness (generalized): Secondary | ICD-10-CM

## 2017-04-20 DIAGNOSIS — R278 Other lack of coordination: Secondary | ICD-10-CM

## 2017-04-20 NOTE — Patient Instructions (Addendum)
Sitting    Sit comfortably. Allow body's muscles to relax. Place hands on belly. Inhale slowly and deeply for _3__ seconds, so hands move out. Then take _3__ seconds to exhale. Repeat _5__ times. Do every time you have to go to the bathroom to relax the pelvic floor.  Copyright  VHI. All rights reserved.  Use breath app on phone.   Toileting Techniques for Bowel Movements (Defecation) Using your belly (abdomen) and pelvic floor muscles to have a bowel movement is usually instinctive.  Sometimes people can have problems with these muscles and have to relearn proper defecation (emptying) techniques.  If you have weakness in your muscles, organs that are falling out, decreased sensation in your pelvis, or ignore your urge to go, you may find yourself straining to have a bowel movement.  You are straining if you are: . holding your breath or taking in a huge gulp of air and holding it  . keeping your lips and jaw tensed and closed tightly . turning red in the face because of excessive pushing or forcing . developing or worsening your  hemorrhoids . getting faint while pushing . not emptying completely and have to defecate many times a day  If you are straining, you are actually making it harder for yourself to have a bowel movement.  Many people find they are pulling up with the pelvic floor muscles and closing off instead of opening the anus. Due to lack pelvic floor relaxation and coordination the abdominal muscles, one has to work harder to push the feces out.  Many people have never been taught how to defecate efficiently and effectively.  Notice what happens to your body when you are having a bowel movement.  While you are sitting on the toilet pay attention to the following areas: . Jaw and mouth position . Angle of your hips   . Whether your feet touch the ground or not . Arm placement  . Spine position . Waist . Belly tension . Anus (opening of the anal canal)  An  Evacuation/Defecation Plan   Here are the 4 basic points:  1. Lean forward enough for your elbows to rest on your knees 2. Support your feet on the floor or use a low stool if your feet don't touch the floor  3. Push out your belly as if you have swallowed a beach ball-you should feel a widening of your waist 4. Open and relax your pelvic floor muscles, rather than tightening around the anus      The following conditions my require modifications to your toileting posture:  . If you have had surgery in the past that limits your back, hip, pelvic, knee or ankle flexibility . Constipation   Your healthcare practitioner may make the following additional suggestions and adjustments:  1) Sit on the toilet  a) Make sure your feet are supported. b) Notice your hip angle and spine position-most people find it effective to lean forward or raise their knees, which can help the muscles around the anus to relax  c) When you lean forward, place your forearms on your thighs for support  2) Relax suggestions a) Breath deeply in through your nose and out slowly through your mouth as if you are smelling the flowers and blowing out the candles. b) To become aware of how to relax your muscles, contracting and releasing muscles can be helpful.  Pull your pelvic floor muscles in tightly by using the image of holding back gas, or closing around  the anus (visualize making a circle smaller) and lifting the anus up and in.  Then release the muscles and your anus should drop down and feel open. Repeat 5 times ending with the feeling of relaxation. c) Keep your pelvic floor muscles relaxed; let your belly bulge out. d) The digestive tract starts at the mouth and ends at the anal opening, so be sure to relax both ends of the tube.  Place your tongue on the roof of your mouth with your teeth separated.  This helps relax your mouth and will help to relax the anus at the same time.  3) Empty (defecation) a) Keep your  pelvic floor and sphincter relaxed, then bulge your anal muscles.  Make the anal opening wide.  b) Stick your belly out as if you have swallowed a beach ball. c) Make your belly wall hard using your belly muscles while continuing to breathe. Doing this makes it easier to open your anus. d) Breath out and give a grunt (or try using other sounds such as ahhhh, shhhhh, ohhhh or grrrrrrr).  4) Finish a) As you finish your bowel movement, pull the pelvic floor muscles up and in.  This will leave your anus in the proper place rather than remaining pushed out and down. If you leave your anus pushed out and down, it will start to feel as though that is normal and give you incorrect signals about needing to have a bowel movement.    Earlie Counts, PT [facility] [address] [place], [place] [address] Slow Contraction: Gravity Eliminated (Hook-Lying)    Lie with hips and knees bent. Slowly squeeze pelvic floor for _5__ seconds. Rest for _5__ seconds. Repeat _10__ times. Do __3_ times a day.   Copyright  VHI. All rights reserved.

## 2017-04-20 NOTE — Therapy (Signed)
[facility] [address] [place], [place], [address] Phone: [phone]   Fax:  [phone]  Physical Therapy Treatment  Patient Details  Name: Tonya Bartlett MRN: [id number] Date of Birth: 1945-10-14 Referring Provider: Dr. Frances Nickels   Encounter Date: 04/20/2017  PT End of Session - 04/20/17 1057    Visit Number  2    Date for PT Re-Evaluation  06/09/17    Authorization Type  Medicare    PT Start Time  7673    PT Stop Time  1057    PT Time Calculation (min)  42 min    Activity Tolerance  Patient tolerated treatment well    Behavior During Therapy  [facility] for tasks assessed/performed       Past Medical History:  Diagnosis Date  . Allergic rhinitis   . Colitis   . Depression   . Diverticulosis   . Frequency of urination   . GERD (gastroesophageal reflux disease)   . 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 History of iron deficiency anemia   . History of migraine headaches   . Hyperlipidemia   . IBS (irritable bowel syndrome)   . Memory loss   . Migraine headache   . OA (osteoarthritis)    RIGHT SHOULDER  . Recovering alcoholic ([place])    SINCE [id number]  . Right rotator cuff tear   . Unspecified essential hypertension     Past Surgical History:  Procedure Laterality Date  . BUNIONECTOMY/  HAMMERTOE CORRECTION  RIGHT FOOT  2011  . CATARACT EXTRACTION W/ INTRAOCULAR LENS  IMPLANT, BILATERAL    . KNEE ARTHROSCOPY W/ MENISCECTOMY Bilateral X2  LEFT /    X1  RIGHT  . REPLACEMENT TOTAL KNEE Left 2006  . SHOULDER ARTHROSCOPY WITH SUBACROMIAL DECOMPRESSION, ROTATOR CUFF REPAIR AND BICEP TENDON REPAIR Right 05/23/2013   Procedure: RIGHT SHOULDER ARTHROSCOPY EXAM UNDER ANESTHESIA  WITH SUBACROMIAL DECOMPRESSION,DISTAL CLAVICLE RESECTION, SADLABRAL DEBRIDEMENT CHONDROPLASTY, BICEP TENOTOMY ;  Surgeon: Sydnee Cabal, MD;  Location: [facility];  Service: Orthopedics;   Laterality: Right;  . TONSILLECTOMY AND ADENOIDECTOMY  AGE 72  . TOTAL KNEE ARTHROPLASTY Bilateral LEFT  1996/   RIGHT 2004  . VAGINAL HYSTERECTOMY  1976    There were no vitals filed for this visit.  Subjective Assessment - 04/20/17 1020    Subjective  The bowels is much better. I had a nice solid long bowel movement.  I have been eating more salads. I am taking the fiber pill and that is helping. No bowels are on my underwear. I was able to to make it to the bathroom to have a bowel movement.  No difference with the urinary leakage and I have not worn the pads lately.  I still have those bumps.     Patient Stated Goals  reduce the urinary and fecal leakage                   Pelvic Floor Special Questions - 04/20/17 0001    Pelvic Floor Internal Exam  Patient confirms identification and approves PT to assess muscles and treatment    Exam Type  Vaginal    Palpation  tenderness located in bil. obturator internist, and levator ani    Strength  weak squeeze, no lift many VC        [facility] Adult PT Treatment/Exercise - 04/20/17 0001      Neuro Re-ed  Neuro Re-ed Details   tapping of the pelvic floor muscles to increase circular contraction with a lift      Manual Therapy   Manual Therapy  Internal Pelvic Floor    Internal Pelvic Floor  bilateral sides of obturator internist, urethra sphincter and levator ani             PT Education - 04/20/17 1053    Education provided  Yes    Education Details  pelvic floor contraction; toileting technique; diaphragmatic breathing    Person(s) Educated  Patient    Methods  Explanation;Demonstration;Verbal cues;Handout    Comprehension  Returned demonstration;Verbalized understanding       PT Short Term Goals - 04/20/17 1059      PT SHORT TERM GOAL #1   Title  independent wtih intial HEP    Time  4    Period  Weeks    Status  On-going      PT SHORT TERM GOAL #2   Title  understand how to toilet correctly to relax and  push the bowel out correctly    Time  4    Period  Weeks    Status  On-going      PT SHORT TERM GOAL #3   Title  understand how to perform abdominal massage to assist in peristalic movement of the intestines    Time  8    Period  Weeks    Status  Achieved        PT Long Term Goals - 04/14/17 1622      PT LONG TERM GOAL #1   Title  independent with HEP and understand how to progress herself    Time  8    Period  Weeks    Status  New    [place] Date  06/09/17      PT LONG TERM GOAL #2   Title  urinary leakage with sneezing, coughing and laughing decreased >/= 75% due to increased pelvic floor coordination    Time  8    Period  Weeks    Status  New    Target Date  06/09/17      PT LONG TERM GOAL #3   Title  urge to void improved to 5-10 minutes so she is able to slowly walk to the bathroom since she is at risk of falls    Time  8    Period  Weeks    Status  New    Target Date  06/09/17      PT LONG TERM GOAL #4   Title  fecal leakage decreaased >/= 75% due to improved pelvic floor strength and coordination    Time  8    Period  Weeks    Target Date  06/09/17            Plan - 04/20/17 1023    Clinical Impression Statement  Patient reports no fecal leakage since evaluation.  Patient reports no change with urination.  Patient still needs tactile and verbal cues to contract the pelvic floor.  Patient is able to use her breathe app on her phone for the diaphragmatic breathing.  Patient will benefit from skilled therapy to work on pelvic floor coordination to reduce urinary leakage and improve constipation.     Clinical Impairments Affecting Rehab Potential  collitis, Bil. knee replacement, vaginal hysterectomy, depression ;Patient will be having a left THR on 04/26/2017    PT Frequency  2x / week    PT  Duration  8 weeks    PT Treatment/Interventions  Biofeedback;Therapeutic activities;Therapeutic exercise;Neuromuscular re-education;Patient/family education;Manual  techniques    PT Next Visit Plan  pelvic floor contraction,  soft tissue work; Soil scientist with ball squeeze    PT Home Exercise Plan  progress as needed    Recommended Other Services  MD signed initial eval    Consulted and Agree with Plan of Care  Patient       Patient will benefit from skilled therapeutic intervention in order to improve the following deficits and impairments:  Increased fascial restricitons, Increased muscle spasms, Decreased strength, Decreased endurance, Decreased coordination  Visit Diagnosis: Muscle weakness (generalized)  Other lack of coordination     Problem List Patient Active Problem List   Diagnosis Date Noted  . MCI (mild cognitive impairment) 09/19/2015  . Memory deficit 09/19/2015  . Psoriasis of scalp 11/06/2014  . Left knee pain 11/06/2014  . Sepsis ([place]) 10/29/2014  . SIRS (systemic inflammatory response syndrome) ([place]) 10/29/2014  . Fever 10/29/2014  . Joint pain 10/29/2014  . Serum sickness 10/29/2014  . Arthralgia   . 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 Depression (emotion) 11/16/2013  . S/P arthroscopy of shoulder 05/23/2013  . ADD (attention deficit disorder) 11/17/2011  . Essential hypertension, benign 10/15/2009  . SEBORRHEIC KERATOSIS, INFLAMED 10/17/2008  . CARPAL TUNNEL SYNDROME, LEFT 06/22/2008  . Allergic rhinitis 08/30/2007  . Severe obesity (BMI >= 40) ([place]) 03/31/2007  . Hyperlipidemia 11/11/2006  . Migraine 11/11/2006  . Osteoarthritis 11/11/2006    Earlie Counts, PT 04/20/17 11:01 AM   [facility] [address], [place], [place], [address] Phone: [phone]   Fax:  [phone]  Name: Tonya Bartlett MRN: [id number] Date of Birth: 1945/09/09

## 2017-04-21 ENCOUNTER — Encounter: Payer: Self-pay | Admitting: Adult Health

## 2017-04-21 ENCOUNTER — Ambulatory Visit (INDEPENDENT_AMBULATORY_CARE_PROVIDER_SITE_OTHER): Payer: Medicare Other | Admitting: Adult Health

## 2017-04-21 VITALS — BP 152/104 | HR 89 | Temp 98.2°F | Wt 235.0 lb

## 2017-04-21 DIAGNOSIS — R6889 Other general symptoms and signs: Secondary | ICD-10-CM | POA: Diagnosis not present

## 2017-04-21 DIAGNOSIS — L4050 Arthropathic psoriasis, unspecified: Secondary | ICD-10-CM | POA: Diagnosis not present

## 2017-04-21 DIAGNOSIS — M5136 Other intervertebral disc degeneration, lumbar region: Secondary | ICD-10-CM | POA: Diagnosis not present

## 2017-04-21 LAB — POC INFLUENZA A&B (BINAX/QUICKVUE)
INFLUENZA A, POC: NEGATIVE
INFLUENZA B, POC: NEGATIVE

## 2017-04-21 NOTE — Progress Notes (Signed)
Subjective:    Patient ID: Tonya Bartlett, female    DOB: 02-Feb-1946, 72 y.o.   MRN: [id number]  HPI  72 year old female who  has a past medical history of Allergic rhinitis, Colitis, Depression, Diverticulosis, Frequency of urination, GERD (gastroesophageal reflux disease), History of DVT of lower extremity, History of iron deficiency anemia, History of migraine headaches, Hyperlipidemia, IBS (irritable bowel syndrome), Memory loss, Migraine headache, OA (osteoarthritis), Recovering alcoholic ([place]), Right rotator cuff tear, and Unspecified essential hypertension.  72 year old female who  has a past medical history of Allergic rhinitis, Colitis, Depression, Diverticulosis, Frequency of urination, GERD (gastroesophageal reflux disease), History of DVT of lower extremity, History of iron deficiency anemia, History of migraine headaches, Hyperlipidemia, IBS (irritable bowel syndrome), Memory loss, Migraine headache, OA (osteoarthritis), Recovering alcoholic ([place]), Right rotator cuff tear, and Unspecified essential hypertension.  She presents to the office today with the complaint of episodes of shortness of breath, chills, and joint aches. Her symptoms started last night. She is feeling better today   Rheumatology wanted her cleared for flu before they would see her.    Review of Systems See HPI   Past Medical History:  Diagnosis Date  . Allergic rhinitis   . Colitis   . Depression   . Diverticulosis   . Frequency of urination   . GERD (gastroesophageal reflux disease)   . 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 History of iron deficiency anemia   . History of migraine headaches   . Hyperlipidemia   . IBS (irritable bowel syndrome)   . Memory loss   . Migraine headache   . OA (osteoarthritis)    RIGHT SHOULDER  . Recovering alcoholic ([place])    SINCE [id number]  . Right rotator cuff tear   . Unspecified essential hypertension     Social History    Socioeconomic History  . Marital status: Widowed    Spouse name: Not on file  . Number of children: Not on file  . Years of education: Not on file  . Highest education level: Not on file  Social Needs  . Financial resource strain: Not on file  . Food insecurity - worry: Not on file  . Food insecurity - inability: Not on file  . Transportation needs - medical: Not on file  . Transportation needs - non-medical: Not on file  Occupational History  . Not on file  Tobacco Use  . Smoking status: Former Smoker    Packs/day: 0.50    Years: 15.00    Pack years: 7.50    Types: Cigarettes    Last attempt to quit: 05/15/1985    Years since quitting: 31.9  . Smokeless tobacco: Never Used  Substance and Sexual Activity  . Alcohol use: No    Comment: RECOVERING ALCOHOLIC--  QUIT [id number]  . Drug use: No  . Sexual activity: No  Other Topics Concern  . 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 BUNIONECTOMY/  HAMMERTOE CORRECTION  RIGHT FOOT  2011  . CATARACT EXTRACTION W/ INTRAOCULAR LENS  IMPLANT, BILATERAL    . KNEE ARTHROSCOPY W/ MENISCECTOMY Bilateral X2  LEFT /    X1  RIGHT  .  REPLACEMENT TOTAL KNEE Left 2006  . SHOULDER ARTHROSCOPY WITH SUBACROMIAL DECOMPRESSION, ROTATOR CUFF REPAIR AND BICEP TENDON REPAIR Right 05/23/2013   Procedure: RIGHT SHOULDER ARTHROSCOPY EXAM UNDER ANESTHESIA  WITH SUBACROMIAL DECOMPRESSION,DISTAL CLAVICLE RESECTION, SADLABRAL DEBRIDEMENT CHONDROPLASTY, BICEP TENOTOMY ;  Surgeon: Sydnee Cabal, MD;  Location: [facility];  Service: Orthopedics;  Laterality: Right;  . TONSILLECTOMY AND ADENOIDECTOMY  AGE 17  . TOTAL KNEE ARTHROPLASTY Bilateral LEFT  1996/   RIGHT 2004  . VAGINAL HYSTERECTOMY  1976    Family History  Problem Relation Age of Onset  . Heart  disease Father 37  . Hypertension Father   . Melanoma Mother   . Cancer Sister        skin CA, 2 sisters  . Esophageal cancer Brother   . Dementia Paternal Grandfather     Allergies  Allergen Reactions  . Penicillins Anaphylaxis    Has patient had a PCN reaction causing immediate rash, facial/tongue/throat swelling, SOB or lightheadedness with hypotension: Yes Has patient had a PCN reaction causing severe rash involving mucus membranes or skin necrosis: Yes Has patient had a PCN reaction that required hospitalization: No Has patient had a PCN reaction occurring within the last 10 year No If all of the above answers are "NO", then may proceed with Cephalosporin use.   . Erythromycin Other (See Comments)    SEVERE STOMACH CRAMPS  . Morphine And Related Nausea And Vomiting  . Nsaids Other (See Comments)    SEVERE STOMACH CRAMPS, MOUTH SORES **Able to tolerate Tylenol  . Nickel Rash    Including snaps on hospital gowns     Current Outpatient Medications on File Prior to Visit  Medication Sig Dispense Refill  . acetaminophen (TYLENOL) 500 MG tablet Take 500 mg by mouth daily as needed for moderate pain.    Marland Kitchen atorvastatin (LIPITOR) 20 MG tablet Take 1 tablet (20 mg total) by mouth daily. 90 tablet 3  . betamethasone dipropionate 0.05 % lotion Apply topically 2 (two) times daily. (Patient taking differently: Apply 1 application topically 3 (three) times a week. ) 60 mL 4  . buPROPion (WELLBUTRIN XL) 150 MG 24 hr tablet Take 1 tablet (150 mg total) by mouth daily. 90 tablet 1  . Carboxymethylcellul-Glycerin (LUBRICATING EYE DROPS OP) Place 1 drop into both eyes daily.    . cetirizine (ZYRTEC) 10 MG tablet Take 10 mg by mouth daily.    Marland Kitchen HYDROcodone-acetaminophen (NORCO/VICODIN) 5-325 MG tablet Take 1-2 tablets by mouth every 6 (six) hours as needed. (Patient taking differently: Take 1 tablet by mouth 2 (two) times daily as needed for moderate pain. ) 15 tablet 0  . hydrocortisone cream  1 % Apply 1 application topically daily as needed for itching.    Marland Kitchen L-Methylfolate-B12-B6-B2 (CEREFOLIN) 07-17-48-5 MG TABS Take one caplet by mouth daily. 90 each 3  . losartan (COZAAR) 50 MG tablet TAKE 1 TABLET(50 MG) BY MOUTH DAILY 90 tablet 2  . omeprazole (PRILOSEC) 20 MG capsule Take 20 mg by mouth daily.    Marland Kitchen omeprazole (PRILOSEC) 40 MG capsule Take 1 capsule (40 mg total) by mouth daily. 30 capsule 3  . psyllium (REGULOID) 0.52 g capsule Take 0.52 g by mouth daily.    . SUMAtriptan (IMITREX) 100 MG tablet Take 1 tablet (100 mg total) by mouth every 2 (two) hours as needed for migraine. May repeat in 2 hours if headache persists or recurs. (Patient taking differently: Take 50 mg by mouth every 2 (two) hours as needed  for migraine. May repeat in 2 hours if headache persists or recurs. ) 10 tablet 0  . traZODone (DESYREL) 50 MG tablet TAKE 1 TABLET(50 MG) BY MOUTH AT BEDTIME AS NEEDED FOR SLEEP (Patient taking differently: Take 50 mg by mouth daily at bedtime) 90 tablet 1   No current facility-administered medications on file prior to visit.     BP (!) 152/104   Pulse 89   Temp 98.2 F (36.8 C) (Oral)   Wt 235 lb (106.6 kg)   SpO2 98%   BMI 37.36 kg/m       Objective:   Physical Exam  Constitutional: She is oriented to person, place, and time. She appears well-developed and well-nourished. No distress.  Cardiovascular: Normal rate, regular rhythm, normal heart sounds and intact distal pulses. Exam reveals no gallop and no friction rub.  No murmur heard. Pulmonary/Chest: Effort normal and breath sounds normal. No respiratory distress. She has no wheezes. She has no rales. She exhibits no tenderness.  Neurological: She is alert and oriented to person, place, and time.  Skin: Skin is warm and dry. No rash noted. She is not diaphoretic. No erythema. No pallor.  Psychiatric: She has a normal mood and affect. Her behavior is normal. Judgment and thought content normal.  Nursing note and  vitals reviewed.     Assessment & Plan:  1. Flu-like symptoms - Negative flu. She is feeling better today. Advised rest and hydration. 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  - POC Influenza A&B(BINAX/QUICKVUE) - Follow up as needed  [name], NP

## 2017-04-21 NOTE — Progress Notes (Signed)
Preop on 3/8.  Surgery on 3/19.  Need orders in epic

## 2017-04-21 NOTE — Progress Notes (Signed)
Subjective:    Patient ID: Tonya Bartlett, female    DOB: November 30, 1945, 72 y.o.   MRN: [id number]  HPI Presents with 24 hours of chills, shortness of breath and bodyaches.  She is having joint pain in her wrists and ankles and feet moreso than usual but this is resolved toady. She called rheumatology and they wanted her cleared for flu before they will see her.  She fell 3 weeks ago and broke 3 ribs on the left side.  Blood pressure is elevated today.  She has taken her BP meds today.    Review of Systems  Constitutional: Positive for appetite change, chills, diaphoresis and fatigue. Negative for fever.  Respiratory: Positive for shortness of breath and wheezing. Negative for cough and chest tightness.   Cardiovascular: Negative for chest pain and palpitations.  Gastrointestinal: Negative.    Past Medical History:  Diagnosis Date  . Allergic rhinitis   . Colitis   . Depression   . Diverticulosis   . Frequency of urination   . GERD (gastroesophageal reflux disease)   . 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 History of iron deficiency anemia   . History of migraine headaches   . Hyperlipidemia   . IBS (irritable bowel syndrome)   . Memory loss   . Migraine headache   . OA (osteoarthritis)    RIGHT SHOULDER  . Recovering alcoholic ([place])    SINCE [id number]  . Right rotator cuff tear   . Unspecified essential hypertension     Social History   Socioeconomic History  . Marital status: Widowed    Spouse name: Not on file  . Number of children: Not on file  . Years of education: Not on file  . Highest education level: Not on file  Social Needs  . Financial resource strain: Not on file  . Food insecurity - worry: Not on file  . Food insecurity - inability: Not on file  . Transportation needs - medical: Not on file  . Transportation needs - non-medical: Not on file  Occupational History  . Not on file  Tobacco Use  . Smoking status: Former  Smoker    Packs/day: 0.50    Years: 15.00    Pack years: 7.50    Types: Cigarettes    Last attempt to quit: 05/15/1985    Years since quitting: 31.9  . Smokeless tobacco: Never Used  Substance and Sexual Activity  . Alcohol use: No    Comment: RECOVERING ALCOHOLIC--  QUIT [id number]  . Drug use: No  . Sexual activity: No  Other Topics Concern  . 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 BUNIONECTOMY/  HAMMERTOE CORRECTION  RIGHT FOOT  2011  . CATARACT EXTRACTION W/ INTRAOCULAR LENS  IMPLANT, BILATERAL    . KNEE ARTHROSCOPY W/ MENISCECTOMY Bilateral X2  LEFT /    X1  RIGHT  . REPLACEMENT TOTAL KNEE Left 2006  . SHOULDER ARTHROSCOPY WITH SUBACROMIAL DECOMPRESSION, ROTATOR CUFF REPAIR AND BICEP TENDON REPAIR Right 05/23/2013   Procedure: RIGHT SHOULDER ARTHROSCOPY EXAM UNDER ANESTHESIA  WITH SUBACROMIAL DECOMPRESSION,DISTAL CLAVICLE RESECTION, SADLABRAL DEBRIDEMENT CHONDROPLASTY, BICEP TENOTOMY ;  Surgeon: Sydnee Cabal, MD;  Location: [facility];  Service: Orthopedics;  Laterality: Right;  . TONSILLECTOMY AND ADENOIDECTOMY  AGE 71  . TOTAL KNEE ARTHROPLASTY Bilateral LEFT  1996/   RIGHT 2004  . VAGINAL HYSTERECTOMY  1976    Family History  Problem Relation Age of Onset  . Heart disease Father 51  . Hypertension Father   . Melanoma Mother   . Cancer Sister        skin CA, 2 sisters  . Esophageal cancer Brother   . Dementia Paternal Grandfather     Allergies  Allergen Reactions  . Penicillins Anaphylaxis    Has patient had a PCN reaction causing immediate rash, facial/tongue/throat swelling, SOB or lightheadedness with hypotension: Yes Has patient had a PCN reaction causing severe rash involving mucus membranes or skin necrosis: Yes Has patient had a PCN reaction that required  hospitalization: No Has patient had a PCN reaction occurring within the last 10 year No If all of the above answers are "NO", then may proceed with Cephalosporin use.   . Erythromycin Other (See Comments)    SEVERE STOMACH CRAMPS  . Morphine And Related Nausea And Vomiting  . Nsaids Other (See Comments)    SEVERE STOMACH CRAMPS, MOUTH SORES **Able to tolerate Tylenol  . Nickel Rash    Including snaps on hospital gowns     Current Outpatient Medications on File Prior to Visit  Medication Sig Dispense Refill  . acetaminophen (TYLENOL) 500 MG tablet Take 500 mg by mouth daily as needed for moderate pain.    Marland Kitchen atorvastatin (LIPITOR) 20 MG tablet Take 1 tablet (20 mg total) by mouth daily. 90 tablet 3  . betamethasone dipropionate 0.05 % lotion Apply topically 2 (two) times daily. (Patient taking differently: Apply 1 application topically 3 (three) times a week. ) 60 mL 4  . buPROPion (WELLBUTRIN XL) 150 MG 24 hr tablet Take 1 tablet (150 mg total) by mouth daily. 90 tablet 1  . Carboxymethylcellul-Glycerin (LUBRICATING EYE DROPS OP) Place 1 drop into both eyes daily.    . cetirizine (ZYRTEC) 10 MG tablet Take 10 mg by mouth daily.    Marland Kitchen HYDROcodone-acetaminophen (NORCO/VICODIN) 5-325 MG tablet Take 1-2 tablets by mouth every 6 (six) hours as needed. (Patient taking differently: Take 1 tablet by mouth 2 (two) times daily as needed for moderate pain. ) 15 tablet 0  . hydrocortisone cream 1 % Apply 1 application topically daily as needed for itching.    Marland Kitchen L-Methylfolate-B12-B6-B2 (CEREFOLIN) 07-17-48-5 MG TABS Take one caplet by mouth daily. 90 each 3  . losartan (COZAAR) 50 MG tablet TAKE 1 TABLET(50 MG) BY MOUTH DAILY 90 tablet 2  . omeprazole (PRILOSEC) 20 MG capsule Take 20 mg by mouth daily.    Marland Kitchen omeprazole (PRILOSEC) 40 MG capsule Take 1 capsule (40 mg total) by mouth daily. 30 capsule 3  . psyllium (REGULOID) 0.52 g capsule Take 0.52 g by mouth daily.    . SUMAtriptan (IMITREX) 100 MG  tablet Take 1 tablet (100 mg total) by mouth every 2 (two) hours as needed for migraine. May repeat in 2 hours if headache persists or recurs. (Patient taking differently: Take 50 mg by mouth every 2 (two) hours as needed for migraine. May repeat in 2 hours if headache persists or recurs. ) 10 tablet 0  . traZODone (DESYREL) 50 MG tablet TAKE 1 TABLET(50 MG) BY MOUTH AT BEDTIME AS NEEDED FOR SLEEP (Patient taking differently: Take 50 mg by mouth daily at bedtime) 90 tablet 1   No  current facility-administered medications on file prior to visit.     BP (!) 152/104   Pulse 89   Temp 98.2 F (36.8 C) (Oral)   Wt 235 lb (106.6 kg)   SpO2 98%   BMI 37.36 kg/m    Past Medical History:  Diagnosis Date  . Allergic rhinitis   . Colitis   . Depression   . Diverticulosis   . Frequency of urination   . GERD (gastroesophageal reflux disease)   . 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 History of iron deficiency anemia   . History of migraine headaches   . Hyperlipidemia   . IBS (irritable bowel syndrome)   . Memory loss   . Migraine headache   . OA (osteoarthritis)    RIGHT SHOULDER  . Recovering alcoholic ([place])    SINCE [id number]  . Right rotator cuff tear   . Unspecified essential hypertension     Social History   Socioeconomic History  . Marital status: Widowed    Spouse name: Not on file  . Number of children: Not on file  . Years of education: Not on file  . Highest education level: Not on file  Social Needs  . Financial resource strain: Not on file  . Food insecurity - worry: Not on file  . Food insecurity - inability: Not on file  . Transportation needs - medical: Not on file  . Transportation needs - non-medical: Not on file  Occupational History  . Not on file  Tobacco Use  . Smoking status: Former Smoker    Packs/day: 0.50    Years: 15.00    Pack years: 7.50    Types: Cigarettes    Last attempt to quit: 05/15/1985    Years since  quitting: 31.9  . Smokeless tobacco: Never Used  Substance and Sexual Activity  . Alcohol use: No    Comment: RECOVERING ALCOHOLIC--  QUIT [id number]  . Drug use: No  . Sexual activity: No  Other Topics Concern  . 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 BUNIONECTOMY/  HAMMERTOE CORRECTION  RIGHT FOOT  2011  . CATARACT EXTRACTION W/ INTRAOCULAR LENS  IMPLANT, BILATERAL    . KNEE ARTHROSCOPY W/ MENISCECTOMY Bilateral X2  LEFT /    X1  RIGHT  . REPLACEMENT TOTAL KNEE Left 2006  . SHOULDER ARTHROSCOPY WITH SUBACROMIAL DECOMPRESSION, ROTATOR CUFF REPAIR AND BICEP TENDON REPAIR Right 05/23/2013   Procedure: RIGHT SHOULDER ARTHROSCOPY EXAM UNDER ANESTHESIA  WITH SUBACROMIAL DECOMPRESSION,DISTAL CLAVICLE RESECTION, SADLABRAL DEBRIDEMENT CHONDROPLASTY, BICEP TENOTOMY ;  Surgeon: Sydnee Cabal, MD;  Location: [facility];  Service: Orthopedics;  Laterality: Right;  . TONSILLECTOMY AND ADENOIDECTOMY  AGE 51  . TOTAL KNEE ARTHROPLASTY Bilateral LEFT  1996/   RIGHT 2004  . VAGINAL HYSTERECTOMY  1976    Family History  Problem Relation Age of Onset  . Heart disease Father 20  . Hypertension Father   . Melanoma Mother   . Cancer Sister        skin CA, 2 sisters  . Esophageal cancer Brother   . Dementia Paternal Grandfather     Allergies  Allergen Reactions  . Penicillins Anaphylaxis  Has patient had a PCN reaction causing immediate rash, facial/tongue/throat swelling, SOB or lightheadedness with hypotension: Yes Has patient had a PCN reaction causing severe rash involving mucus membranes or skin necrosis: Yes Has patient had a PCN reaction that required hospitalization: No Has patient had a PCN reaction occurring within the last 10 year No If all of the above answers are "NO", then may  proceed with Cephalosporin use.   . Erythromycin Other (See Comments)    SEVERE STOMACH CRAMPS  . Morphine And Related Nausea And Vomiting  . Nsaids Other (See Comments)    SEVERE STOMACH CRAMPS, MOUTH SORES **Able to tolerate Tylenol  . Nickel Rash    Including snaps on hospital gowns     Current Outpatient Medications on File Prior to Visit  Medication Sig Dispense Refill  . acetaminophen (TYLENOL) 500 MG tablet Take 500 mg by mouth daily as needed for moderate pain.    Marland Kitchen atorvastatin (LIPITOR) 20 MG tablet Take 1 tablet (20 mg total) by mouth daily. 90 tablet 3  . betamethasone dipropionate 0.05 % lotion Apply topically 2 (two) times daily. (Patient taking differently: Apply 1 application topically 3 (three) times a week. ) 60 mL 4  . buPROPion (WELLBUTRIN XL) 150 MG 24 hr tablet Take 1 tablet (150 mg total) by mouth daily. 90 tablet 1  . Carboxymethylcellul-Glycerin (LUBRICATING EYE DROPS OP) Place 1 drop into both eyes daily.    . cetirizine (ZYRTEC) 10 MG tablet Take 10 mg by mouth daily.    Marland Kitchen HYDROcodone-acetaminophen (NORCO/VICODIN) 5-325 MG tablet Take 1-2 tablets by mouth every 6 (six) hours as needed. (Patient taking differently: Take 1 tablet by mouth 2 (two) times daily as needed for moderate pain. ) 15 tablet 0  . hydrocortisone cream 1 % Apply 1 application topically daily as needed for itching.    Marland Kitchen L-Methylfolate-B12-B6-B2 (CEREFOLIN) 07-17-48-5 MG TABS Take one caplet by mouth daily. 90 each 3  . losartan (COZAAR) 50 MG tablet TAKE 1 TABLET(50 MG) BY MOUTH DAILY 90 tablet 2  . omeprazole (PRILOSEC) 20 MG capsule Take 20 mg by mouth daily.    Marland Kitchen omeprazole (PRILOSEC) 40 MG capsule Take 1 capsule (40 mg total) by mouth daily. 30 capsule 3  . psyllium (REGULOID) 0.52 g capsule Take 0.52 g by mouth daily.    . SUMAtriptan (IMITREX) 100 MG tablet Take 1 tablet (100 mg total) by mouth every 2 (two) hours as needed for migraine. May repeat in 2 hours if headache persists or recurs.  (Patient taking differently: Take 50 mg by mouth every 2 (two) hours as needed for migraine. May repeat in 2 hours if headache persists or recurs. ) 10 tablet 0  . traZODone (DESYREL) 50 MG tablet TAKE 1 TABLET(50 MG) BY MOUTH AT BEDTIME AS NEEDED FOR SLEEP (Patient taking differently: Take 50 mg by mouth daily at bedtime) 90 tablet 1   No current facility-administered medications on file prior to visit.     BP (!) 152/104   Pulse 89   Temp 98.2 F (36.8 C) (Oral)   Wt 235 lb (106.6 kg)   SpO2 98%   BMI 37.36 kg/m      Objective:   Physical Exam  Constitutional: She appears well-developed and well-nourished. No distress.  HENT:  Right Ear: Tympanic membrane normal.  Left Ear: Tympanic membrane normal.  Nose: Mucosal edema present. No rhinorrhea. Right sinus exhibits no maxillary sinus tenderness and no frontal sinus tenderness. Left sinus exhibits no maxillary sinus tenderness  and no frontal sinus tenderness.  Mouth/Throat: Oropharynx is clear and moist. No oropharyngeal exudate.  Bilateral inferior turbinate erythema  Eyes: Conjunctivae are normal. Right eye exhibits no discharge. Left eye exhibits no discharge.  Cardiovascular: Normal rate, regular rhythm, normal heart sounds and intact distal pulses. Exam reveals no gallop and no friction rub.  No murmur heard. Pulmonary/Chest: Effort normal and breath sounds normal. No respiratory distress. She has no wheezes. She has no rales.  Lymphadenopathy:       Head (right side): Tonsillar adenopathy present. No submental, no submandibular, no preauricular and no posterior auricular adenopathy present.       Head (left side): No submental, no submandibular, no tonsillar, no preauricular and no posterior auricular adenopathy present.    She has no cervical adenopathy.  Skin: Skin is warm and dry. She is not diaphoretic.  Nursing note and vitals reviewed.     Assessment & Plan:  1. Flu-like symptoms Flu swab negative. She will follow  up with rheumatology. - POC Influenza A&B(BINAX/QUICKVUE)  BP is elevated today.  She does not check her BP at home.  She will purchase and BP cuff and check her BP and call with results.  May need a medication adjustment is elevated BP persists.   Odarius Dines C Cindel Daugherty BSN RN NP student

## 2017-04-22 NOTE — Progress Notes (Signed)
LVMM for patient regarding the fact that patient present to PCP on 04/21/2017 with flu like symptoms,  Tested negative by swab for flu.  Called to check on patient status and reminded patient she should not come to hospital with flu like symptoms and to reschedule patient Left call back phone number of [phone] until 200pm on 3/7 and after 2 pm - [phone].

## 2017-04-23 ENCOUNTER — Other Ambulatory Visit: Payer: Self-pay

## 2017-04-23 ENCOUNTER — Encounter (HOSPITAL_COMMUNITY): Payer: Self-pay

## 2017-04-23 ENCOUNTER — Encounter (HOSPITAL_COMMUNITY)
Admission: RE | Admit: 2017-04-23 | Discharge: 2017-04-23 | Disposition: A | Discharge status: Home / Self Care | Payer: Medicare Other | Source: Ambulatory Visit | Attending: Orthopedic Surgery | Admitting: Orthopedic Surgery

## 2017-04-23 ENCOUNTER — Ambulatory Visit: Payer: Medicare Other | Admitting: Physical Therapy

## 2017-04-23 DIAGNOSIS — R278 Other lack of coordination: Secondary | ICD-10-CM | POA: Diagnosis not present

## 2017-04-23 DIAGNOSIS — Z01812 Encounter for preprocedural laboratory examination: Secondary | ICD-10-CM | POA: Diagnosis not present

## 2017-04-23 DIAGNOSIS — M6281 Muscle weakness (generalized): Secondary | ICD-10-CM | POA: Diagnosis not present

## 2017-04-23 HISTORY — DX: Nausea with vomiting, unspecified: R11.2

## 2017-04-23 HISTORY — DX: Other specified postprocedural states: Z98.890

## 2017-04-23 HISTORY — DX: Personal history of other diseases of the digestive system: Z87.19

## 2017-04-23 HISTORY — DX: Adverse effect of unspecified anesthetic, initial encounter: T41.45XA

## 2017-04-23 HISTORY — DX: Other complications of anesthesia, initial encounter: T88.59XA

## 2017-04-23 LAB — BASIC METABOLIC PANEL
ANION GAP: 7 (ref 5–15)
BUN: 12 mg/dL (ref 6–20)
CO2: 27 mmol/L (ref 22–32)
Calcium: 9 mg/dL (ref 8.9–10.3)
Chloride: 106 mmol/L (ref 101–111)
Creatinine, Ser: 0.61 mg/dL (ref 0.44–1.00)
GFR calc Af Amer: >60 mL/min (ref >60)
GLUCOSE: 85 mg/dL (ref 65–99)
POTASSIUM: 3.8 mmol/L (ref 3.5–5.1)
Sodium: 140 mmol/L (ref 135–145)

## 2017-04-23 LAB — CBC
HEMATOCRIT: 38 % (ref 36.0–46.0)
HEMOGLOBIN: 12.3 g/dL (ref 12.0–15.0)
MCH: 32.1 pg (ref 26.0–34.0)
MCHC: 32.4 g/dL (ref 30.0–36.0)
MCV: 99.2 fL (ref 78.0–100.0)
Platelets: 384 10*3/uL (ref 150–400)
RBC: 3.83 MIL/uL — ABNORMAL LOW (ref 3.87–5.11)
RDW: 12.9 % (ref 11.5–15.5)
WBC: 7.2 10*3/uL (ref 4.0–10.5)

## 2017-04-23 LAB — TYPE AND SCREEN
ABO/RH(D): A POS
ANTIBODY SCREEN: NEGATIVE

## 2017-04-23 LAB — SURGICAL PCR SCREEN
MRSA, PCR: NEGATIVE
STAPHYLOCOCCUS AUREUS: NEGATIVE

## 2017-04-23 NOTE — Patient Instructions (Signed)
Moisturizers . They are used in the vagina to hydrate the mucous membrane that make up the vaginal canal. . Designed to keep a more normal acid balance (ph) . Once placed in the vagina, it will last between two to three days.  . Use 2-3 times per week at bedtime and last longer than 60 min. . Ingredients to avoid is glycerin and fragrance, can increase chance of infection . Should not be used just before sex due to causing irritation . Most are gels administered either in a tampon-shaped applicator or as a vaginal suppository. They are non-hormonal.   Types of Moisturizers . Samul Dada- drug store . Vitamin E vaginal suppositories- Whole foods, Amazon . Moist Again . Coconut oil- can break down condoms . Michail Jewels . Yes moisturizer- amazon . NeuEve Silk , NeuEve Silver for menopausal or over 65 (if have severe vaginal atrophy or cancer treatments use NeuEve Silk for  1 month than move to [employer])- [employer], MapleFlower.dk . Olive and Bee intimate cream- www.oliveandbee.com.au  Creams to use externally on the Vulva area  Albertson's (good for for cancer patients that had radiation to the area)- [place] or [employer].FlyingBasics.com.br  V-magic cream - amazon  Julva-amazon  Vital "V Wild Yam salve ( help moisturize and help with thinning vulvar area, does have [place]   Things to avoid in the vaginal area . Do not use things to irritate the vulvar area . No lotions just specialized creams for the vulva area- Neogyn, V-magic, No soaps; can use Aveeno or Calendula cleanser if needed. Must be gentle . No deodorants . No douches . Good to sleep without underwear to let the vaginal area to air out . No scrubbing: spread the lips to let warm water rinse over labias and pat dry   [facility] [address], [place], [place] [address] Phone # [phone] Fax [phone]

## 2017-04-23 NOTE — Progress Notes (Signed)
Patient states at preop that Dr Alvan Dame aware of rib fractures on 04/07/2017 and office visit with PCP on 04/21/2017.

## 2017-04-23 NOTE — Progress Notes (Signed)
CT chest done 04/07/17-epic

## 2017-04-23 NOTE — Patient Instructions (Signed)
Tonya Bartlett  04/23/2017   Your procedure is scheduled on: 05/04/2017   Report to [facility] Main  Entrance  Report to admitting at   0900 AM   Call this number if you have problems the morning of surgery [phone]   Remember: Do not eat food or drink liquids :After Midnight.     Take these medicines the morning of surgery with A SIP OF WATER: Zyrtec, Hydrocodone if needed, Prilosec                                 You may not have any metal on your body including hair pins and              piercings  Do not wear jewelry, make-up, lotions, powders or perfumes, deodorant             Do not wear nail polish.  Do not shave  48 hours prior to surgery.     Do not bring valuables to the hospital. [place].  Contacts, dentures or bridgework may not be worn into surgery.  Leave suitcase in the car. After surgery it may be brought to your room.                     Please read over the following fact sheets you were given: _____________________________________________________________________             [facility] - Preparing for Surgery Before surgery, you can play an important role.  Because skin is not sterile, your skin needs to be as free of germs as possible.  You can reduce the number of germs on your skin by washing with CHG (chlorahexidine gluconate) soap before surgery.  CHG is an antiseptic cleaner which kills germs and bonds with the skin to continue killing germs even after washing. Please DO NOT use if you have an allergy to CHG or antibacterial soaps.  If your skin becomes reddened/irritated stop using the CHG and inform your nurse when you arrive at Short Stay. Do not shave (including legs and underarms) for at least 48 hours prior to the first CHG shower.  You may shave your face/neck. Please follow these instructions carefully:  1.  Shower with CHG Soap the night before surgery and the   morning of Surgery.  2.  If you choose to wash your hair, wash your hair first as usual with your  normal  shampoo.  3.  After you shampoo, rinse your hair and body thoroughly to remove the  shampoo.                           4.  Use CHG as you would any other liquid soap.  You can apply chg directly  to the skin and wash                       Gently with a scrungie or clean washcloth.  5.  Apply the CHG Soap to your body ONLY FROM THE NECK DOWN.   Do not use on face/ open  Wound or open sores. Avoid contact with eyes, ears mouth and genitals (private parts).                       Wash face,  Genitals (private parts) with your normal soap.             6.  Wash thoroughly, paying special attention to the area where your surgery  will be performed.  7.  Thoroughly rinse your body with warm water from the neck down.  8.  DO NOT shower/wash with your normal soap after using and rinsing off  the CHG Soap.                9.  Pat yourself dry with a clean towel.            10.  Wear clean pajamas.            11.  Place clean sheets on your bed the night of your first shower and do not  sleep with pets. Day of Surgery : Do not apply any lotions/deodorants the morning of surgery.  Please wear clean clothes to the hospital/surgery center.  FAILURE TO FOLLOW THESE INSTRUCTIONS MAY RESULT IN THE CANCELLATION OF YOUR SURGERY PATIENT SIGNATURE_________________________________  NURSE SIGNATURE__________________________________  ________________________________________________________________________  WHAT IS A BLOOD TRANSFUSION? Blood Transfusion Information  A transfusion is the replacement of blood or some of its parts. Blood is made up of multiple cells which provide different functions.  Red blood cells carry oxygen and are used for blood loss replacement.  White blood cells fight against infection.  Platelets control bleeding.  Plasma helps clot blood.  Other blood  products are available for specialized needs, such as hemophilia or other clotting disorders. BEFORE THE TRANSFUSION  Who gives blood for transfusions?   Healthy volunteers who are fully evaluated to make sure their blood is safe. This is blood bank blood. Transfusion therapy is the safest it has ever been in the practice of medicine. Before blood is taken from a donor, a complete history is taken to make sure that person has no history of diseases nor engages in risky social behavior (examples are intravenous drug use or sexual activity with multiple partners). The donor's travel history is screened to minimize risk of transmitting infections, such as malaria. The donated blood is tested for signs of infectious diseases, such as HIV and hepatitis. The blood is then tested to be sure it is compatible with you in order to minimize the chance of a transfusion reaction. If you or a relative donates blood, this is often done in anticipation of surgery and is not appropriate for emergency situations. It takes many days to process the donated blood. RISKS AND COMPLICATIONS Although transfusion therapy is very safe and saves many lives, the main dangers of transfusion include:   Getting an infectious disease.  Developing a transfusion reaction. This is an allergic reaction to something in the blood you were given. Every precaution is taken to prevent this. The decision to have a blood transfusion has been considered carefully by your caregiver before blood is given. Blood is not given unless the benefits outweigh the risks. AFTER THE TRANSFUSION  Right after receiving a blood transfusion, you will usually feel much better and more energetic. This is especially true if your red blood cells have gotten low (anemic). The transfusion raises the level of the red blood cells which carry oxygen, and this usually causes an energy increase.  The  nurse administering the transfusion will monitor you carefully for  complications. HOME CARE INSTRUCTIONS  No special instructions are needed after a transfusion. You may find your energy is better. Speak with your caregiver about any limitations on activity for underlying diseases you may have. SEEK MEDICAL CARE IF:   Your condition is not improving after your transfusion.  You develop redness or irritation at the intravenous (IV) site. SEEK IMMEDIATE MEDICAL CARE IF:  Any of the following symptoms occur over the next 12 hours:  Shaking chills.  You have a temperature by mouth above 102 F (38.9 C), not controlled by medicine.  Chest, back, or muscle pain.  People around you feel you are not acting correctly or are confused.  Shortness of breath or difficulty breathing.  Dizziness and fainting.  You get a rash or develop hives.  You have a decrease in urine output.  Your urine turns a dark color or changes to pink, red, or brown. Any of the following symptoms occur over the next 10 days:  You have a temperature by mouth above 102 F (38.9 C), not controlled by medicine.  Shortness of breath.  Weakness after normal activity.  The white part of the eye turns yellow (jaundice).  You have a decrease in the amount of urine or are urinating less often.  Your urine turns a dark color or changes to pink, red, or brown. Document Released: 01/31/2000 Document Revised: 04/27/2011 Document Reviewed: 09/19/2007 ExitCare Patient Information 2014 [place].  _______________________________________________________________________  Incentive Spirometer  An incentive spirometer is a tool that can help keep your lungs clear and active. This tool measures how well you are filling your lungs with each breath. Taking long deep breaths may help reverse or decrease the chance of developing breathing (pulmonary) problems (especially infection) following:  A long period of time when you are unable to move or be active. BEFORE THE PROCEDURE   If  the spirometer includes an indicator to show your best effort, your nurse or respiratory therapist will set it to a desired goal.  If possible, sit up straight or lean slightly forward. Try not to slouch.  Hold the incentive spirometer in an upright position. INSTRUCTIONS FOR USE  1. Sit on the edge of your bed if possible, or sit up as far as you can in bed or on a chair. 2. Hold the incentive spirometer in an upright position. 3. Breathe out normally. 4. Place the mouthpiece in your mouth and seal your lips tightly around it. 5. Breathe in slowly and as deeply as possible, raising the piston or the ball toward the top of the column. 6. Hold your breath for 3-5 seconds or for as long as possible. Allow the piston or ball to fall to the bottom of the column. 7. Remove the mouthpiece from your mouth and breathe out normally. 8. Rest for a few seconds and repeat Steps 1 through 7 at least 10 times every 1-2 hours when you are awake. Take your time and take a few normal breaths between deep breaths. 9. The spirometer may include an indicator to show your best effort. Use the indicator as a goal to work toward during each repetition. 10. After each set of 10 deep breaths, practice coughing to be sure your lungs are clear. If you have an incision (the cut made at the time of surgery), support your incision when coughing by placing a pillow or rolled up towels firmly against it. Once you are able to get  out of bed, walk around indoors and cough well. You may stop using the incentive spirometer when instructed by your caregiver.  RISKS AND COMPLICATIONS  Take your time so you do not get dizzy or light-headed.  If you are in pain, you may need to take or ask for pain medication before doing incentive spirometry. It is harder to take a deep breath if you are having pain. AFTER USE  Rest and breathe slowly and easily.  It can be helpful to keep track of a log of your progress. Your caregiver can  provide you with a simple table to help with this. If you are using the spirometer at home, follow these instructions: [place] IF:   You are having difficultly using the spirometer.  You have trouble using the spirometer as often as instructed.  Your pain medication is not giving enough relief while using the spirometer.  You develop fever of 100.5 F (38.1 C) or higher. SEEK IMMEDIATE MEDICAL CARE IF:   You cough up bloody sputum that had not been present before.  You develop fever of 102 F (38.9 C) or greater.  You develop worsening pain at or near the incision site. MAKE SURE YOU:   Understand these instructions.  Will watch your condition.  Will get help right away if you are not doing well or get worse. Document Released: 06/15/2006 Document Revised: 04/27/2011 Document Reviewed: 08/16/2006 [facility] Patient Information 2014 [place], [place].   ________________________________________________________________________

## 2017-04-23 NOTE — Progress Notes (Signed)
DR Fransisco Beau ( Anesthesia) made aware of EKG from ED visit of fall on 04/07/2017 and repeated EKG on 04/23/2017.  Dr Fransisco Beau aware of 2 EKGs.  No new orders given. Also made aware of fall with rib fractures on 04/07/2017 that rib fractures showed up on Ct chest.  Pastient states Dr Alvan Dame aware.  Patient in today fo rperop visit and in no acute distress.  No new orders given.

## 2017-04-23 NOTE — Therapy (Signed)
[facility] [address] [place], [place], [address] Phone: [phone]   Fax:  [phone]  Physical Therapy Treatment  Patient Details  Name: Tonya Bartlett MRN: [id number] Date of Birth: Feb 18, 1945 Referring Provider: Dr. Frances Nickels   Encounter Date: 04/23/2017  PT End of Session - 04/23/17 0926    Visit Number  3    Date for PT Re-Evaluation  06/09/17    Authorization Type  Medicare    PT Start Time  0847    PT Stop Time  0926    PT Time Calculation (min)  39 min    Activity Tolerance  Patient tolerated treatment well    Behavior During Therapy  [facility] for tasks assessed/performed       Past Medical History:  Diagnosis Date  . Allergic rhinitis   . Colitis   . Depression   . Diverticulosis   . Frequency of urination   . GERD (gastroesophageal reflux disease)   . 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 History of iron deficiency anemia   . History of migraine headaches   . Hyperlipidemia   . IBS (irritable bowel syndrome)   . Memory loss   . Migraine headache   . OA (osteoarthritis)    RIGHT SHOULDER  . 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 Right rotator cuff tear   . Unspecified essential hypertension     Past Surgical History:  Procedure Laterality Date  . BUNIONECTOMY/  HAMMERTOE CORRECTION  RIGHT FOOT  2011  . CATARACT EXTRACTION W/ INTRAOCULAR LENS  IMPLANT, BILATERAL    . KNEE ARTHROSCOPY W/ MENISCECTOMY Bilateral X2  LEFT /    X1  RIGHT  . REPLACEMENT TOTAL KNEE Left 2006  . SHOULDER ARTHROSCOPY WITH SUBACROMIAL DECOMPRESSION, ROTATOR CUFF REPAIR AND BICEP TENDON REPAIR Right 05/23/2013   Procedure: RIGHT SHOULDER ARTHROSCOPY EXAM UNDER ANESTHESIA  WITH SUBACROMIAL DECOMPRESSION,DISTAL CLAVICLE RESECTION, SADLABRAL DEBRIDEMENT CHONDROPLASTY, BICEP TENOTOMY ;  Surgeon: Sydnee Cabal, MD;  Location: [facility];  Service: Orthopedics;   Laterality: Right;  . TONSILLECTOMY AND ADENOIDECTOMY  AGE 72  . TOTAL KNEE ARTHROPLASTY Bilateral LEFT  1996/   RIGHT 2004  . VAGINAL HYSTERECTOMY  1976    There were no vitals filed for this visit.  Subjective Assessment - 04/23/17 0903    Subjective  Pt states she is having a hard time    Patient Stated Goals  reduce the urinary and fecal leakage    Currently in Pain?  No/denies                      [facility] Adult PT Treatment/Exercise - 04/23/17 0001      Self-Care   Self-Care  Other Self-Care Comments    Other Self-Care Comments   contract with tactile cues from self to perineum; vaginal moisturizers education and samples      Neuro Re-ed    Neuro Re-ed Details   tapping of the pelvic floor muscles to increase circular contraction with a lift, diaphragmatic breathing with tactile cues to bulge on inhale and contract on exhale      Manual Therapy   Manual Therapy  Internal Pelvic Floor    Manual therapy comments  pt informed and consent given to perform internal soft tissue mobilization    Internal Pelvic Floor  bilateral sides of obturator internist, urethra sphincter and levator ani  PT Education - 04/23/17 0926    Education provided  Yes    Education Details  moisturizers    Person(s) Educated  Patient    Methods  Explanation;Handout    Comprehension  Verbalized understanding       PT Short Term Goals - 04/20/17 1059      PT SHORT TERM GOAL #1   Title  independent wtih intial HEP    Time  4    Period  Weeks    Status  On-going      PT SHORT TERM GOAL #2   Title  understand how to toilet correctly to relax and push the bowel out correctly    Time  4    Period  Weeks    Status  On-going      PT SHORT TERM GOAL #3   Title  understand how to perform abdominal massage to assist in peristalic movement of the intestines    Time  8    Period  Weeks    Status  Achieved        PT Long Term Goals - 04/14/17 1622      PT LONG  TERM GOAL #1   Title  independent with HEP and understand how to progress herself    Time  8    Period  Weeks    Status  New    [place] Date  06/09/17      PT LONG TERM GOAL #2   Title  urinary leakage with sneezing, coughing and laughing decreased >/= 75% due to increased pelvic floor coordination    Time  8    Period  Weeks    Status  New    [place] Date  06/09/17      PT LONG TERM GOAL #3   Title  urge to void improved to 5-10 minutes so she is able to slowly walk to the bathroom since she is at risk of falls    Time  8    Period  Weeks    Status  New    [place] Date  06/09/17      PT LONG TERM GOAL #4   Title  fecal leakage decreaased >/= 75% due to improved pelvic floor strength and coordination    Time  8    Period  Weeks    Target Date  06/09/17            Plan - 04/23/17 0947    Clinical Impression Statement  Patient was able to perform circular contraction and coordinate with breathing.  Pt notices she is able to have an extra second of control of urination.  She has improved ability to feel her contraction in supine after treatment today.  Pt continues to report difficulty feeling it in seated position but was educated in using tactile cues at home.  Pt was educated in moisturizers for pelvic floor as she reports she gets some vulvar burning at times.  Pt will benefit from skilled PT to progress pelvic floor strength and endurance for bowel and bladder control.     Clinical Impairments Affecting Rehab Potential  collitis, Bil. knee replacement, vaginal hysterectomy, depression ;Patient will be having a left THR on 04/26/2017    PT Treatment/Interventions  Biofeedback;Therapeutic activities;Therapeutic exercise;Neuromuscular re-education;Patient/family education;Manual techniques    PT Next Visit Plan  biofeedback, pelvic floor contraction with hold,  soft tissue work; Soil scientist with ball squeeze    Consulted and Agree with Plan of Care  Patient  Patient will  benefit from skilled therapeutic intervention in order to improve the following deficits and impairments:  Increased fascial restricitons, Increased muscle spasms, Decreased strength, Decreased endurance, Decreased coordination  Visit Diagnosis: Muscle weakness (generalized)  Other lack of coordination     Problem List Patient Active Problem List   Diagnosis Date Noted  . MCI (mild cognitive impairment) 09/19/2015  . Memory deficit 09/19/2015  . Psoriasis of scalp 11/06/2014  . Left knee pain 11/06/2014  . Sepsis ([place]) 10/29/2014  . SIRS (systemic inflammatory response syndrome) ([place]) 10/29/2014  . Fever 10/29/2014  . Joint pain 10/29/2014  . Serum sickness 10/29/2014  . Arthralgia   . 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 Depression (emotion) 11/16/2013  . S/P arthroscopy of shoulder 05/23/2013  . ADD (attention deficit disorder) 11/17/2011  . Essential hypertension, benign 10/15/2009  . SEBORRHEIC KERATOSIS, INFLAMED 10/17/2008  . CARPAL TUNNEL SYNDROME, LEFT 06/22/2008  . Allergic rhinitis 08/30/2007  . Severe obesity (BMI >= 40) ([place]) 03/31/2007  . Hyperlipidemia 11/11/2006  . Migraine 11/11/2006  . Osteoarthritis 11/11/2006    Zannie Cove, PT 04/23/2017, 10:04 AM  [facility] [address] [place], [place], [address] Phone: [phone]   Fax:  [phone]  Name: Tonya Bartlett MRN: [id number] Date of Birth: 08-03-1945

## 2017-04-26 NOTE — Progress Notes (Signed)
Final EKG done 04/23/2017-epic

## 2017-04-27 ENCOUNTER — Telehealth: Payer: Self-pay | Admitting: Adult Health

## 2017-04-27 ENCOUNTER — Encounter: Payer: Self-pay | Admitting: Physical Therapy

## 2017-04-27 ENCOUNTER — Ambulatory Visit: Payer: Medicare Other | Admitting: Physical Therapy

## 2017-04-27 DIAGNOSIS — R278 Other lack of coordination: Secondary | ICD-10-CM | POA: Diagnosis not present

## 2017-04-27 DIAGNOSIS — M6281 Muscle weakness (generalized): Secondary | ICD-10-CM | POA: Diagnosis not present

## 2017-04-27 NOTE — Telephone Encounter (Signed)
Form received 04/27/17.  Waiting on signature.

## 2017-04-27 NOTE — Telephone Encounter (Signed)
Pre-operative Clearance form to be filled out.  Placed in dr's folder.  Fax to: [phone], Attn: Fabio Asa

## 2017-04-27 NOTE — Patient Instructions (Addendum)
Quick Contraction: Gravity Resisted (Sitting)    Sitting, quickly squeeze then fully relax pelvic floor. Perform __1_ sets of __5_. Rest for _1__ seconds between sets. Do _4__ times a day.  Copyright  VHI. All rights reserved.  Slow Contraction: Gravity Resisted (Sitting)    Sitting, slowly squeeze pelvic floor for ___ seconds. Rest for ___ seconds. Repeat ___ times. Do ___ times a day.  Copyright  VHI. All rights reserved.  Slow Contraction: Gravity Resisted (Sitting)    Sitting, slowly squeeze pelvic floor for _5__ seconds. Rest for _5__ seconds. Repeat _10__ times. Do __4_ times a day. Count out loud. Build toward 10 seconds.  Copyright  VHI. All rights reserved.     Certain foods and liquids will decrease the pH making the urine more acidic.  Urinary urgency increases when the urine has a low pH.  Most common irritants: alcohol, carbonated beverages and caffinated beverages.  Foods to avoid: apple juice, apples, ascorbic acid, canteloupes, chili, citrus fruits, coffee, cranberries, grapes, guava, peaches, pepper, pineapple, plums, strawberries, tea, tomatoes, and vinegar.  Drinking plenty of water may help to increase the pH and dilute out any of the effects of specific irritants.  Foods that are NOT irritating to the bladder include: Pears, papayas, sun-brewed teas, watermelons, non-citrus herbal teas, apricots, kava and low-acid instant drinks ([employer])  [facility] [address], [place], [place] [address] Phone # [phone] Fax [phone]

## 2017-04-27 NOTE — Therapy (Signed)
[facility] [address], [place], [place], [address] Phone: [phone]   Fax:  [phone]  Physical Therapy Treatment  Patient Details  Name: Tonya Bartlett MRN: [id number] Date of Birth: [birth year] Referring Provider: Dr. Williston Cellar   Encounter Date: 04/27/2017  PT End of Session - 04/27/17 1023    Visit Number  4    Date for PT Re-Evaluation  06/09/17    Authorization Type  Medicare    PT Start Time  1015    PT Stop Time  1048    PT Time Calculation (min)  33 min    Activity Tolerance  Patient tolerated treatment well    Behavior During Therapy  [facility] for tasks assessed/performed       Past Medical History:  Diagnosis Date  . Allergic rhinitis   . Colitis   . Complication of anesthesia   . Depression   . Diverticulosis   . Frequency of urination   . GERD (gastroesophageal reflux disease)   . 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 History of hiatal hernia   . History of iron deficiency anemia   . History of migraine headaches   . Hyperlipidemia   . IBS (irritable bowel syndrome)   . Memory loss   . Migraine headache    hx of migraines   . OA (osteoarthritis)    RIGHT SHOULDER  . PONV (postoperative nausea and vomiting)   . Recovering alcoholic ([place])    SINCE [id number]  . Right rotator cuff tear   . Unspecified essential hypertension     Past Surgical History:  Procedure Laterality Date  . BUNIONECTOMY/  HAMMERTOE CORRECTION  RIGHT FOOT  2011  . CATARACT EXTRACTION W/ INTRAOCULAR LENS  IMPLANT, BILATERAL    . KNEE ARTHROSCOPY W/ MENISCECTOMY Bilateral X2  LEFT /    X1  RIGHT  . REPLACEMENT TOTAL KNEE Left 2006  . SHOULDER ARTHROSCOPY WITH SUBACROMIAL DECOMPRESSION, ROTATOR CUFF REPAIR AND BICEP TENDON REPAIR Right 05/23/2013   Procedure: RIGHT SHOULDER ARTHROSCOPY EXAM UNDER ANESTHESIA  WITH SUBACROMIAL DECOMPRESSION,DISTAL CLAVICLE RESECTION, SADLABRAL  DEBRIDEMENT CHONDROPLASTY, BICEP TENOTOMY ;  Surgeon: Sydnee Cabal, MD;  Location: [place];  Service: Orthopedics;  Laterality: Right;  . TONSILLECTOMY AND ADENOIDECTOMY  AGE 1  . TOTAL KNEE ARTHROPLASTY Bilateral LEFT  1996/   RIGHT 2004  . 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 VAGINAL HYSTERECTOMY  1976    There were no vitals filed for this visit.  Subjective Assessment - 04/27/17 1018    Subjective  I am having no difficulty with pooping.  I had a psoriatic flare up so every joint in my body hurt. I am still recovering. I get my hip replaced in 1 week. I have made it to the bathroom without wetting my pants.     Patient Stated Goals  reduce the urinary and fecal leakage    Currently in Pain?  No/denies         [facility] PT Assessment - 04/27/17 0001      Assessment   Medical Diagnosis  R15.9 incontinence of feces, unspecified fecal incontinence; R32 Urinary incontinence, unspecified type    Referring Provider  Dr. Bystrom Cellar    Onset Date/Surgical Date  03/19/17    Prior Therapy  none      Precautions   Precautions  None      Restrictions   Weight  Bearing Restrictions  No      Balance Screen   Has the patient fallen in the past 6 months  No    Has the patient had a decrease in activity level because of a fear of falling?   No    Is the patient reluctant to leave their home because of a fear of falling?   No      Home Social worker  Private residence    Living Arrangements  Alone      Prior Function   Level of Independence  Independent    Vocation  Part time employment    Vocation Requirements  sitting      Cognition   Overall Cognitive Status  Within Functional Limits for tasks assessed    Memory  Impaired    Memory Impairment  Decreased short term memory      Observation/Other Assessments   Focus on Therapeutic Outcomes (FOTO)   therapist discretion limited by 10% due to leakage       Posture/Postural Control    Posture/Postural Control  No significant limitations      AROM   Overall AROM   Within functional limits for tasks performed      PROM   Overall PROM   Within functional limits for tasks performed      Strength   Right Hip Flexion  4/5    Left Hip Flexion  4/5      Palpation   SI assessment   pelvic is in correct alignment      Transfers   Transfers  Not assessed      Ambulation/Gait   Ambulation/Gait  No               Pelvic Floor Special Questions - 04/27/17 0001    Urinary Leakage  No    Urinary urgency  No        [facility] Adult PT Treatment/Exercise - 04/27/17 0001      Self-Care   Self-Care  Other Self-Care Comments    Other Self-Care Comments   instruction of bladder irritants and how they affect the bladder.       Neuro Re-ed    Neuro Re-ed Details   sitting pelvic floor contraction with counting out loud to enforce breathing.  diaphragmatic breathing and verbal cues to reduce chest breathing             PT Education - 04/27/17 1050    Education provided  Yes    Education Details  bladder irritants, pelvic floor contraction in sitting    Person(s) Educated  Patient    Methods  Explanation;Demonstration;Handout    Comprehension  Returned demonstration;Verbalized understanding       PT Short Term Goals - 04/27/17 1020      PT SHORT TERM GOAL #1   Title  independent wtih intial HEP    Time  4    Period  Weeks    Status  Achieved      PT SHORT TERM GOAL #2   Title  understand how to toilet correctly to relax and push the bowel out correctly    Time  4    Period  Weeks    Status  Achieved      PT SHORT TERM GOAL #3   Title  understand how to perform abdominal massage to assist in peristalic movement of the intestines    Time  8    Period  Weeks    Status  Achieved        PT Long Term Goals - 04/27/17 1022      PT LONG TERM GOAL #1   Title  independent with HEP and understand how to progress herself    Time  8    Period  Weeks     Status  Achieved      PT LONG TERM GOAL #2   Title  urinary leakage with sneezing, coughing and laughing decreased >/= 75% due to increased pelvic floor coordination    Time  8    Period  Weeks    Status  Achieved      PT LONG TERM GOAL #3   Title  urge to void improved to 5-10 minutes so she is able to slowly walk to the bathroom since she is at risk of falls    Time  8    Period  Weeks    Status  Achieved      PT LONG TERM GOAL #4   Title  fecal leakage decreaased >/= 75% due to improved pelvic floor strength and coordination    Time  8    Period  Weeks    Status  Achieved            Plan - 04/27/17 1050    Clinical Impression Statement  Patient has met all of her goals.  She is not have fecal leakage or difficulty with having a bowel movement.  Patient reports no urinary leakage.  She is able to make it to the bathroom in time without leakage and knows how to contract and not bear down.  Patient understands ways to work with vaginal dryness.  Patient is independent with her HEP.  Patient is scheduled for a total hip with anterior approach next week.      Rehab Potential  Excellent    Clinical Impairments Affecting Rehab Potential  collitis, Bil. knee replacement, vaginal hysterectomy, depression ;Patient will be having a left THR on 04/26/2017    PT Treatment/Interventions  Biofeedback;Therapeutic activities;Therapeutic exercise;Neuromuscular re-education;Patient/family education;Manual techniques    PT Next Visit Plan  Discharge this visit to HEP    PT Home Exercise Plan  Current HEP    Consulted and Agree with Plan of Care  Patient       Patient will benefit from skilled therapeutic intervention in order to improve the following deficits and impairments:  Increased fascial restricitons, Increased muscle spasms, Decreased strength, Decreased endurance, Decreased coordination  Visit Diagnosis: Muscle weakness (generalized)  Other lack of coordination     Problem  List Patient Active Problem List   Diagnosis Date Noted  . MCI (mild cognitive impairment) 09/19/2015  . Memory deficit 09/19/2015  . Psoriasis of scalp 11/06/2014  . Left knee pain 11/06/2014  . Sepsis ([place]) 10/29/2014  . SIRS (systemic inflammatory response syndrome) ([place]) 10/29/2014  . Fever 10/29/2014  . Joint pain 10/29/2014  . Serum sickness 10/29/2014  . Arthralgia   . 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 Depression (emotion) 11/16/2013  . S/P arthroscopy of shoulder 05/23/2013  . ADD (attention deficit disorder) 11/17/2011  . Essential hypertension, benign 10/15/2009  . SEBORRHEIC KERATOSIS, INFLAMED 10/17/2008  . CARPAL TUNNEL SYNDROME, LEFT 06/22/2008  . Allergic rhinitis 08/30/2007  . Severe obesity (BMI >= 40) ([place]) 03/31/2007  . Hyperlipidemia 11/11/2006  . Migraine 11/11/2006  . Osteoarthritis 11/11/2006    Korah Hufstedler 04/27/2017, 10:53 AM  [facility] [address] [place],  [place], [address] Phone: [phone]   Fax:  [phone]  Name: Tonya Bartlett MRN: [id number] Date of Birth: 22-Jun-1945 PHYSICAL THERAPY DISCHARGE SUMMARY  Visits from Start of Care: 4  Current functional level related to goals / functional outcomes: See above.    Remaining deficits: See above.    Education / Equipment: HEP Plan: Patient agrees to discharge.  Patient goals were met. Patient is being discharged due to meeting the stated rehab goals.  Thank you for the referral. Earlie Counts, PT 04/27/17 10:54 AM  ?????

## 2017-04-28 NOTE — H&P (Signed)
TOTAL HIP ADMISSION H&P  Patient is admitted for left total hip arthroplasty, anterior approach.  Subjective:  Chief Complaint:    Left hip primary OA / pain  HPI: Tonya Bartlett, 72 y.o. female, has a history of pain and functional disability in the left hip(s) due to arthritis and patient has failed non-surgical conservative treatments for greater than 12 weeks to include NSAID's and/or analgesics, corticosteriod injections and activity modification.  Onset of symptoms was gradual starting 2-3 years ago with gradually worsening course since that time.The patient noted no past surgery on the left hip.  Patient currently rates pain in the left hip at 7 out of 10 with activity. Patient has night pain, worsening of pain with activity and weight bearing, trendelenberg gait, pain that interfers with activities of daily living and pain with passive range of motion. Patient has evidence of periarticular osteophytes and joint space narrowing by imaging studies. This condition presents safety issues increasing the risk of falls.   There is no current active infection.  Risks, benefits and expectations were discussed with the patient.  Risks including but not limited to the risk of anesthesia, blood clots, nerve damage, blood vessel damage, failure of the prosthesis, infection and up to and including death.  Patient understand the risks, benefits and expectations and wishes to proceed with surgery.   PCP: [name], NP  D/C Plans:       Home  Post-op Meds:       No Rx given   Tranexamic Acid:      To be given -   topically  Decadron:      Is to be given  FYI:     Xarelto ( No ASA or NSAIDs)  Norco 5 prn  DME:   Pt already has equipment  PT:   No PT    Patient Active Problem List   Diagnosis Date Noted  . MCI (mild cognitive impairment) 09/19/2015  . Memory deficit 09/19/2015  . Psoriasis of scalp 11/06/2014  . Left knee pain 11/06/2014  . Sepsis ([place]) 10/29/2014  . SIRS (systemic  inflammatory response syndrome) ([place]) 10/29/2014  . Fever 10/29/2014  . Joint pain 10/29/2014  . Serum sickness 10/29/2014  . Arthralgia   . 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 Depression (emotion) 11/16/2013  . S/P arthroscopy of shoulder 05/23/2013  . ADD (attention deficit disorder) 11/17/2011  . Essential hypertension, benign 10/15/2009  . SEBORRHEIC KERATOSIS, INFLAMED 10/17/2008  . CARPAL TUNNEL SYNDROME, LEFT 06/22/2008  . Allergic rhinitis 08/30/2007  . Severe obesity (BMI >= 40) ([place]) 03/31/2007  . Hyperlipidemia 11/11/2006  . Migraine 11/11/2006  . Osteoarthritis 11/11/2006   Past Medical History:  Diagnosis Date  . Allergic rhinitis   . Colitis   . Complication of anesthesia   . Depression   . Diverticulosis   . Frequency of urination   . GERD (gastroesophageal reflux disease)   . 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 History of hiatal hernia   . History of iron deficiency anemia   . History of migraine headaches   . Hyperlipidemia   . IBS (irritable bowel syndrome)   . Memory loss   . Migraine headache    hx of migraines   . OA (osteoarthritis)    RIGHT SHOULDER  . PONV (postoperative nausea and vomiting)   . Recovering alcoholic ([place])    SINCE [id number]  . Right rotator cuff tear   .  Unspecified essential hypertension     Past Surgical History:  Procedure Laterality Date  . BUNIONECTOMY/  HAMMERTOE CORRECTION  RIGHT FOOT  2011  . CATARACT EXTRACTION W/ INTRAOCULAR LENS  IMPLANT, BILATERAL    . KNEE ARTHROSCOPY W/ MENISCECTOMY Bilateral X2  LEFT /    X1  RIGHT  . REPLACEMENT TOTAL KNEE Left 2006  . SHOULDER ARTHROSCOPY WITH SUBACROMIAL DECOMPRESSION, ROTATOR CUFF REPAIR AND BICEP TENDON REPAIR Right 05/23/2013   Procedure: RIGHT SHOULDER ARTHROSCOPY EXAM UNDER ANESTHESIA  WITH SUBACROMIAL DECOMPRESSION,DISTAL CLAVICLE RESECTION, SADLABRAL DEBRIDEMENT CHONDROPLASTY, BICEP TENOTOMY ;  Surgeon: Sydnee Cabal,  MD;  Location: [place];  Service: Orthopedics;  Laterality: Right;  . TONSILLECTOMY AND ADENOIDECTOMY  AGE 12  . TOTAL KNEE ARTHROPLASTY Bilateral LEFT  1996/   RIGHT 2004  . 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 VAGINAL HYSTERECTOMY  1976    No current facility-administered medications for this encounter.    Current Outpatient Medications  Medication Sig Dispense Refill Last Dose  . acetaminophen (TYLENOL) 500 MG tablet Take 500 mg by mouth daily as needed for moderate pain.   Taking  . atorvastatin (LIPITOR) 20 MG tablet Take 1 tablet (20 mg total) by mouth daily. 90 tablet 3 Taking  . betamethasone dipropionate 0.05 % lotion Apply topically 2 (two) times daily. (Patient taking differently: Apply 1 application topically 3 (three) times a week. ) 60 mL 4 Taking  . Carboxymethylcellul-Glycerin (LUBRICATING EYE DROPS OP) Place 1 drop into both eyes daily.   Taking  . cetirizine (ZYRTEC) 10 MG tablet Take 10 mg by mouth daily.   Taking  . HYDROcodone-acetaminophen (NORCO/VICODIN) 5-325 MG tablet Take 1-2 tablets by mouth every 6 (six) hours as needed. (Patient taking differently: Take 1 tablet by mouth 2 (two) times daily as needed for moderate pain. ) 15 tablet 0 Taking  . hydrocortisone cream 1 % Apply 1 application topically daily as needed for itching.   Taking  . L-Methylfolate-B12-B6-B2 (CEREFOLIN) 07-17-48-5 MG TABS Take one caplet by mouth daily. 90 each 3 Taking  . losartan (COZAAR) 50 MG tablet TAKE 1 TABLET(50 MG) BY MOUTH DAILY 90 tablet 2 Taking  . omeprazole (PRILOSEC) 20 MG capsule Take 20 mg by mouth daily.   Taking  . psyllium (REGULOID) 0.52 g capsule Take 0.52 g by mouth daily.   Taking  . SUMAtriptan (IMITREX) 100 MG tablet Take 1 tablet (100 mg total) by mouth every 2 (two) hours as needed for migraine. May repeat in 2 hours if headache persists or recurs. (Patient taking differently: Take 50 mg by mouth every 2 (two) hours as needed for migraine. May repeat in  2 hours if headache persists or recurs. ) 10 tablet 0 Taking  . traZODone (DESYREL) 50 MG tablet TAKE 1 TABLET(50 MG) BY MOUTH AT BEDTIME AS NEEDED FOR SLEEP (Patient taking differently: Take 50 mg by mouth daily at bedtime) 90 tablet 1 Taking  . buPROPion (WELLBUTRIN XL) 150 MG 24 hr tablet Take 1 tablet (150 mg total) by mouth daily. 90 tablet 1 Taking  . omeprazole (PRILOSEC) 40 MG capsule Take 1 capsule (40 mg total) by mouth daily. 30 capsule 3 Taking   Allergies  Allergen Reactions  . Penicillins Anaphylaxis    Has patient had a PCN reaction causing immediate rash, facial/tongue/throat swelling, SOB or lightheadedness with hypotension: Yes Has patient had a PCN reaction causing severe rash involving mucus membranes or skin necrosis: Yes Has patient had a PCN reaction that  required hospitalization: No Has patient had a PCN reaction occurring within the last 10 year No If all of the above answers are "NO", then may proceed with Cephalosporin use.   . Erythromycin Other (See Comments)    SEVERE STOMACH CRAMPS  . Morphine And Related Nausea And Vomiting  . Nsaids Other (See Comments)    SEVERE STOMACH CRAMPS, MOUTH SORES **Able to tolerate Tylenol  . Nickel Rash    Including snaps on hospital gowns     Social History   Tobacco Use  . Smoking status: Former Smoker    Packs/day: 0.50    Years: 15.00    Pack years: 7.50    Types: Cigarettes    Last attempt to quit: 05/15/1985    Years since quitting: 31.9  . Smokeless tobacco: Never Used  Substance Use Topics  . Alcohol use: No    Comment: RECOVERING ALCOHOLIC--  QUIT 07-37-1062    Family History  Problem Relation Age of Onset  . Heart disease Father 40  . Hypertension Father   . Melanoma Mother   . Cancer Sister        skin CA, 2 sisters  . Esophageal cancer Brother   . Dementia Paternal Grandfather      Review of Systems  Constitutional: Negative.   HENT: Negative.   Eyes: Negative.   Respiratory: Negative.    Cardiovascular: Negative.   Gastrointestinal: Positive for heartburn.  Genitourinary: Negative.   Musculoskeletal: Positive for joint pain.  Skin: Negative.   Neurological: Negative.     Objective:  Physical Exam  Constitutional: She is oriented to person, place, and time. She appears well-developed.  HENT:  Head: Normocephalic.  Eyes: Pupils are equal, round, and reactive to light.  Neck: Neck supple. No JVD present. No tracheal deviation present. No thyromegaly present.  Cardiovascular: Normal rate, regular rhythm and intact distal pulses.  Respiratory: Effort normal and breath sounds normal. No respiratory distress. She has no wheezes.  GI: Soft. There is no tenderness. There is no guarding.  Musculoskeletal:       Left hip: She exhibits decreased range of motion, decreased strength, tenderness and bony tenderness. She exhibits no swelling, no deformity and no laceration.  Lymphadenopathy:    She has no cervical adenopathy.  Neurological: She is alert and oriented to person, place, and time.  Skin: Skin is warm and dry.  Psychiatric: She has a normal mood and affect.       Labs:  Estimated body mass index is 36.88 kg/m as calculated from the following:   Height as of 04/23/17: 5' 6.5" (1.689 m).   Weight as of 04/23/17: 105.2 kg (232 lb).   Imaging Review Plain radiographs demonstrate severe degenerative joint disease of the left hip(s). The bone quality appears to be good for age and reported activity level.  Assessment/Plan:  End stage arthritis, left hip  The patient history, physical examination, clinical judgement of the provider and imaging studies are consistent with end stage degenerative joint disease of the left hip(s) and total hip arthroplasty is deemed medically necessary. The treatment options including medical management, injection therapy, arthroscopy and arthroplasty were discussed at length. The risks and benefits of total hip arthroplasty were  presented and reviewed. The risks due to aseptic loosening, infection, stiffness, dislocation/subluxation,  thromboembolic complications and other imponderables were discussed.  The patient acknowledged the explanation, agreed to proceed with the plan and consent was signed. Patient is being admitted for inpatient treatment for surgery, pain control, PT, OT,  prophylactic antibiotics, VTE prophylaxis, progressive ambulation and ADL's and discharge planning.The patient is planning to be discharged home.     West Pugh Maurianna Benard   PA-C  04/28/2017, 2:47 PM

## 2017-04-29 NOTE — Telephone Encounter (Signed)
Faxed and received confirmation the fax was successful.  No further action required.  Will close note.

## 2017-05-03 MED ORDER — DEXTROSE 5 % IV SOLN
400.0000 mg | INTRAVENOUS | Status: AC
  Administered 2017-05-04: 400 mg via INTRAVENOUS
  Filled 2017-05-03 (×2): qty 10

## 2017-05-03 MED ORDER — SODIUM CHLORIDE 0.9 % IV SOLN
1500.0000 mg | INTRAVENOUS | Status: AC
  Administered 2017-05-04: 1500 mg via INTRAVENOUS
  Filled 2017-05-03: qty 1500

## 2017-05-04 ENCOUNTER — Other Ambulatory Visit: Payer: Self-pay

## 2017-05-04 ENCOUNTER — Encounter (HOSPITAL_COMMUNITY): Payer: Self-pay | Admitting: Anesthesiology

## 2017-05-04 ENCOUNTER — Inpatient Hospital Stay (HOSPITAL_COMMUNITY): Payer: Medicare Other

## 2017-05-04 ENCOUNTER — Inpatient Hospital Stay (HOSPITAL_COMMUNITY)
Admission: RE | Admit: 2017-05-04 | Discharge: 2017-05-05 | DRG: 470 | Disposition: A | Discharge status: Home / Self Care | Payer: Medicare Other | Attending: Orthopedic Surgery | Admitting: Orthopedic Surgery

## 2017-05-04 ENCOUNTER — Inpatient Hospital Stay (HOSPITAL_COMMUNITY): Payer: Medicare Other | Admitting: Anesthesiology

## 2017-05-04 ENCOUNTER — Encounter (HOSPITAL_COMMUNITY)
Admission: RE | Disposition: A | Discharge status: Home / Self Care | Payer: Self-pay | Source: Home / Self Care | Attending: Orthopedic Surgery

## 2017-05-04 DIAGNOSIS — F1021 Alcohol dependence, in remission: Secondary | ICD-10-CM | POA: Diagnosis present

## 2017-05-04 DIAGNOSIS — Z9109 Other allergy status, other than to drugs and biological substances: Secondary | ICD-10-CM

## 2017-05-04 DIAGNOSIS — Z6836 Body mass index (BMI) 36.0-36.9, adult: Secondary | ICD-10-CM | POA: Diagnosis not present

## 2017-05-04 DIAGNOSIS — K219 Gastro-esophageal reflux disease without esophagitis: Secondary | ICD-10-CM | POA: Diagnosis not present

## 2017-05-04 DIAGNOSIS — Z96652 Presence of left artificial knee joint: Secondary | ICD-10-CM | POA: Diagnosis present

## 2017-05-04 DIAGNOSIS — E785 Hyperlipidemia, unspecified: Secondary | ICD-10-CM | POA: Diagnosis present

## 2017-05-04 DIAGNOSIS — I10 Essential (primary) hypertension: Secondary | ICD-10-CM | POA: Diagnosis not present

## 2017-05-04 DIAGNOSIS — F329 Major depressive disorder, single episode, unspecified: Secondary | ICD-10-CM | POA: Diagnosis present

## 2017-05-04 DIAGNOSIS — Z9842 Cataract extraction status, left eye: Secondary | ICD-10-CM | POA: Diagnosis not present

## 2017-05-04 DIAGNOSIS — L409 Psoriasis, unspecified: Secondary | ICD-10-CM | POA: Diagnosis not present

## 2017-05-04 DIAGNOSIS — M19011 Primary osteoarthritis, right shoulder: Secondary | ICD-10-CM | POA: Diagnosis not present

## 2017-05-04 DIAGNOSIS — Z86718 Personal history of other venous thrombosis and embolism: Secondary | ICD-10-CM | POA: Diagnosis not present

## 2017-05-04 DIAGNOSIS — Z886 Allergy status to analgesic agent status: Secondary | ICD-10-CM | POA: Diagnosis not present

## 2017-05-04 DIAGNOSIS — Z881 Allergy status to other antibiotic agents status: Secondary | ICD-10-CM

## 2017-05-04 DIAGNOSIS — Z885 Allergy status to narcotic agent status: Secondary | ICD-10-CM

## 2017-05-04 DIAGNOSIS — M1612 Unilateral primary osteoarthritis, left hip: Principal | ICD-10-CM | POA: Diagnosis present

## 2017-05-04 DIAGNOSIS — Z961 Presence of intraocular lens: Secondary | ICD-10-CM | POA: Diagnosis present

## 2017-05-04 DIAGNOSIS — E669 Obesity, unspecified: Secondary | ICD-10-CM | POA: Diagnosis not present

## 2017-05-04 DIAGNOSIS — F988 Other specified behavioral and emotional disorders with onset usually occurring in childhood and adolescence: Secondary | ICD-10-CM | POA: Diagnosis present

## 2017-05-04 DIAGNOSIS — Z8249 Family history of ischemic heart disease and other diseases of the circulatory system: Secondary | ICD-10-CM | POA: Diagnosis not present

## 2017-05-04 DIAGNOSIS — Z9841 Cataract extraction status, right eye: Secondary | ICD-10-CM

## 2017-05-04 DIAGNOSIS — Z96642 Presence of left artificial hip joint: Secondary | ICD-10-CM | POA: Diagnosis not present

## 2017-05-04 DIAGNOSIS — M25552 Pain in left hip: Secondary | ICD-10-CM | POA: Diagnosis present

## 2017-05-04 DIAGNOSIS — Z87891 Personal history of nicotine dependence: Secondary | ICD-10-CM | POA: Diagnosis not present

## 2017-05-04 DIAGNOSIS — K589 Irritable bowel syndrome without diarrhea: Secondary | ICD-10-CM | POA: Diagnosis not present

## 2017-05-04 DIAGNOSIS — Z880 Allergy status to penicillin: Secondary | ICD-10-CM

## 2017-05-04 DIAGNOSIS — Z96649 Presence of unspecified artificial hip joint: Secondary | ICD-10-CM

## 2017-05-04 DIAGNOSIS — Z471 Aftercare following joint replacement surgery: Secondary | ICD-10-CM | POA: Diagnosis not present

## 2017-05-04 DIAGNOSIS — Z79899 Other long term (current) drug therapy: Secondary | ICD-10-CM

## 2017-05-04 DIAGNOSIS — G43909 Migraine, unspecified, not intractable, without status migrainosus: Secondary | ICD-10-CM | POA: Diagnosis not present

## 2017-05-04 HISTORY — PX: TOTAL HIP ARTHROPLASTY: SHX124

## 2017-05-04 SURGERY — ARTHROPLASTY, HIP, TOTAL, ANTERIOR APPROACH
Anesthesia: Monitor Anesthesia Care | Site: Hip | Laterality: Left

## 2017-05-04 MED ORDER — PHENYLEPHRINE HCL 10 MG/ML IJ SOLN
INTRAMUSCULAR | Status: AC
  Filled 2017-05-04: qty 1

## 2017-05-04 MED ORDER — DOCUSATE SODIUM 100 MG PO CAPS
100.0000 mg | ORAL_CAPSULE | Freq: Two times a day (BID) | ORAL | Status: DC
  Administered 2017-05-04 – 2017-05-05 (×2): 100 mg via ORAL
  Filled 2017-05-04 (×2): qty 1

## 2017-05-04 MED ORDER — LORATADINE 10 MG PO TABS
10.0000 mg | ORAL_TABLET | Freq: Every day | ORAL | Status: DC
  Administered 2017-05-05: 10 mg via ORAL
  Filled 2017-05-04: qty 1

## 2017-05-04 MED ORDER — LOSARTAN POTASSIUM 50 MG PO TABS
50.0000 mg | ORAL_TABLET | Freq: Every day | ORAL | Status: DC
  Administered 2017-05-04 – 2017-05-05 (×2): 50 mg via ORAL
  Filled 2017-05-04 (×2): qty 1

## 2017-05-04 MED ORDER — FENTANYL CITRATE (PF) 100 MCG/2ML IJ SOLN
INTRAMUSCULAR | Status: DC | PRN
  Administered 2017-05-04 (×2): 50 ug via INTRAVENOUS

## 2017-05-04 MED ORDER — CHLORHEXIDINE GLUCONATE 4 % EX LIQD: 60.0000 mL | Freq: Once | CUTANEOUS | Status: DC

## 2017-05-04 MED ORDER — HYDROCODONE-ACETAMINOPHEN 5-325 MG PO TABS: 1.0000 | ORAL_TABLET | ORAL | 0 refills | Status: DC | PRN

## 2017-05-04 MED ORDER — PHENOL 1.4 % MT LIQD: 1.0000 | OROMUCOSAL | Status: DC | PRN

## 2017-05-04 MED ORDER — PHENYLEPHRINE 40 MCG/ML (10ML) SYRINGE FOR IV PUSH (FOR BLOOD PRESSURE SUPPORT)
PREFILLED_SYRINGE | INTRAVENOUS | Status: DC | PRN
  Administered 2017-05-04 (×7): 80 ug via INTRAVENOUS

## 2017-05-04 MED ORDER — FERROUS SULFATE 325 (65 FE) MG PO TABS: 325.0000 mg | ORAL_TABLET | Freq: Three times a day (TID) | ORAL | 3 refills | Status: DC

## 2017-05-04 MED ORDER — MIDAZOLAM HCL 5 MG/5ML IJ SOLN
INTRAMUSCULAR | Status: DC | PRN
  Administered 2017-05-04: 2 mg via INTRAVENOUS

## 2017-05-04 MED ORDER — FENTANYL CITRATE (PF) 100 MCG/2ML IJ SOLN
INTRAMUSCULAR | Status: AC
  Filled 2017-05-04: qty 2

## 2017-05-04 MED ORDER — HYDROCODONE-ACETAMINOPHEN 5-325 MG PO TABS
1.0000 | ORAL_TABLET | ORAL | Status: DC | PRN
  Administered 2017-05-04: 1 via ORAL
  Filled 2017-05-04: qty 1

## 2017-05-04 MED ORDER — ONDANSETRON HCL 4 MG/2ML IJ SOLN: 4.0000 mg | Freq: Once | INTRAMUSCULAR | Status: DC | PRN

## 2017-05-04 MED ORDER — TRAMADOL HCL 50 MG PO TABS
50.0000 mg | ORAL_TABLET | Freq: Four times a day (QID) | ORAL | Status: DC
  Administered 2017-05-04 – 2017-05-05 (×3): 50 mg via ORAL
  Filled 2017-05-04 (×3): qty 1

## 2017-05-04 MED ORDER — MENTHOL 3 MG MT LOZG: 1.0000 | LOZENGE | OROMUCOSAL | Status: DC | PRN

## 2017-05-04 MED ORDER — PHENYLEPHRINE 40 MCG/ML (10ML) SYRINGE FOR IV PUSH (FOR BLOOD PRESSURE SUPPORT)
PREFILLED_SYRINGE | INTRAVENOUS | Status: AC
  Filled 2017-05-04: qty 10

## 2017-05-04 MED ORDER — PROPOFOL 500 MG/50ML IV EMUL
INTRAVENOUS | Status: DC | PRN
  Administered 2017-05-04: 75 ug/kg/min via INTRAVENOUS

## 2017-05-04 MED ORDER — MAGNESIUM CITRATE PO SOLN: 1.0000 | Freq: Once | ORAL | Status: DC | PRN

## 2017-05-04 MED ORDER — DEXAMETHASONE SODIUM PHOSPHATE 10 MG/ML IJ SOLN
INTRAMUSCULAR | Status: DC | PRN
  Administered 2017-05-04: 10 mg via INTRAVENOUS

## 2017-05-04 MED ORDER — DOCUSATE SODIUM 100 MG PO CAPS: 100.0000 mg | ORAL_CAPSULE | Freq: Two times a day (BID) | ORAL | 0 refills | Status: DC

## 2017-05-04 MED ORDER — PANTOPRAZOLE SODIUM 40 MG PO TBEC
40.0000 mg | DELAYED_RELEASE_TABLET | Freq: Every day | ORAL | Status: DC
  Administered 2017-05-05: 10:00:00 40 mg via ORAL
  Filled 2017-05-04: qty 1

## 2017-05-04 MED ORDER — MEPERIDINE HCL 50 MG/ML IJ SOLN: 6.2500 mg | INTRAMUSCULAR | Status: DC | PRN

## 2017-05-04 MED ORDER — POLYETHYLENE GLYCOL 3350 17 G PO PACK: 17.0000 g | PACK | Freq: Two times a day (BID) | ORAL | 0 refills | Status: DC

## 2017-05-04 MED ORDER — BISACODYL 10 MG RE SUPP: 10.0000 mg | Freq: Every day | RECTAL | Status: DC | PRN

## 2017-05-04 MED ORDER — ACETAMINOPHEN 325 MG PO TABS: 325.0000 mg | ORAL_TABLET | Freq: Four times a day (QID) | ORAL | Status: DC | PRN

## 2017-05-04 MED ORDER — TRAZODONE HCL 50 MG PO TABS: 50.0000 mg | ORAL_TABLET | Freq: Every evening | ORAL | Status: DC | PRN

## 2017-05-04 MED ORDER — DEXAMETHASONE SODIUM PHOSPHATE 10 MG/ML IJ SOLN
10.0000 mg | Freq: Once | INTRAMUSCULAR | Status: AC
  Administered 2017-05-05: 10:00:00 10 mg via INTRAVENOUS
  Filled 2017-05-04: qty 1

## 2017-05-04 MED ORDER — PROPOFOL 10 MG/ML IV BOLUS
INTRAVENOUS | Status: AC
  Filled 2017-05-04: qty 40

## 2017-05-04 MED ORDER — ONDANSETRON HCL 4 MG/2ML IJ SOLN: 4.0000 mg | Freq: Four times a day (QID) | INTRAMUSCULAR | Status: DC | PRN

## 2017-05-04 MED ORDER — FENTANYL CITRATE (PF) 100 MCG/2ML IJ SOLN: 25.0000 ug | INTRAMUSCULAR | Status: DC | PRN

## 2017-05-04 MED ORDER — TRANEXAMIC ACID 1000 MG/10ML IV SOLN
2000.0000 mg | Freq: Once | INTRAVENOUS | Status: DC
  Filled 2017-05-04: qty 20

## 2017-05-04 MED ORDER — METHOCARBAMOL 500 MG PO TABS: 500.0000 mg | ORAL_TABLET | Freq: Four times a day (QID) | ORAL | 0 refills | Status: DC | PRN

## 2017-05-04 MED ORDER — FERROUS SULFATE 325 (65 FE) MG PO TABS
325.0000 mg | ORAL_TABLET | Freq: Three times a day (TID) | ORAL | Status: DC
  Administered 2017-05-04 – 2017-05-05 (×2): 325 mg via ORAL
  Filled 2017-05-04 (×3): qty 1

## 2017-05-04 MED ORDER — HYDROMORPHONE HCL 1 MG/ML IJ SOLN
INTRAMUSCULAR | Status: AC
  Filled 2017-05-04: qty 1

## 2017-05-04 MED ORDER — VANCOMYCIN HCL IN DEXTROSE 1-5 GM/200ML-% IV SOLN
1000.0000 mg | Freq: Two times a day (BID) | INTRAVENOUS | Status: AC
  Administered 2017-05-04: 22:00:00 1000 mg via INTRAVENOUS
  Filled 2017-05-04: qty 200

## 2017-05-04 MED ORDER — HYDROCODONE-ACETAMINOPHEN 7.5-325 MG PO TABS
1.0000 | ORAL_TABLET | ORAL | Status: DC | PRN
  Administered 2017-05-04 – 2017-05-05 (×2): 1 via ORAL
  Administered 2017-05-05 (×2): 2 via ORAL
  Filled 2017-05-04 (×2): qty 2
  Filled 2017-05-04: qty 1
  Filled 2017-05-04: qty 2

## 2017-05-04 MED ORDER — METOCLOPRAMIDE HCL 5 MG PO TABS: 5.0000 mg | ORAL_TABLET | Freq: Three times a day (TID) | ORAL | Status: DC | PRN

## 2017-05-04 MED ORDER — LACTATED RINGERS IV SOLN
INTRAVENOUS | Status: DC
  Administered 2017-05-04 (×4): via INTRAVENOUS

## 2017-05-04 MED ORDER — DIPHENHYDRAMINE HCL 12.5 MG/5ML PO ELIX: 12.5000 mg | ORAL_SOLUTION | ORAL | Status: DC | PRN

## 2017-05-04 MED ORDER — HYDROMORPHONE HCL 1 MG/ML IJ SOLN
0.2500 mg | INTRAMUSCULAR | Status: DC | PRN
  Administered 2017-05-04 (×2): 0.5 mg via INTRAVENOUS

## 2017-05-04 MED ORDER — ATORVASTATIN CALCIUM 20 MG PO TABS
20.0000 mg | ORAL_TABLET | Freq: Every day | ORAL | Status: DC
  Administered 2017-05-04 – 2017-05-05 (×2): 20 mg via ORAL
  Filled 2017-05-04 (×2): qty 1

## 2017-05-04 MED ORDER — RIVAROXABAN 10 MG PO TABS
10.0000 mg | ORAL_TABLET | ORAL | Status: DC
  Administered 2017-05-05: 10 mg via ORAL
  Filled 2017-05-04: qty 1

## 2017-05-04 MED ORDER — METHOCARBAMOL 500 MG PO TABS
500.0000 mg | ORAL_TABLET | Freq: Four times a day (QID) | ORAL | Status: DC | PRN
  Administered 2017-05-04 – 2017-05-05 (×3): 500 mg via ORAL
  Filled 2017-05-04 (×3): qty 1

## 2017-05-04 MED ORDER — SUMATRIPTAN SUCCINATE 50 MG PO TABS
50.0000 mg | ORAL_TABLET | ORAL | Status: DC | PRN
  Filled 2017-05-04: qty 1

## 2017-05-04 MED ORDER — POLYETHYLENE GLYCOL 3350 17 G PO PACK
17.0000 g | PACK | Freq: Two times a day (BID) | ORAL | Status: DC
  Administered 2017-05-04 – 2017-05-05 (×2): 17 g via ORAL
  Filled 2017-05-04 (×2): qty 1

## 2017-05-04 MED ORDER — ONDANSETRON HCL 4 MG/2ML IJ SOLN
INTRAMUSCULAR | Status: DC | PRN
  Administered 2017-05-04: 4 mg via INTRAVENOUS

## 2017-05-04 MED ORDER — BUPROPION HCL ER (XL) 150 MG PO TB24
150.0000 mg | ORAL_TABLET | Freq: Every day | ORAL | Status: DC
  Administered 2017-05-05: 10:00:00 150 mg via ORAL
  Filled 2017-05-04: qty 1

## 2017-05-04 MED ORDER — RIVAROXABAN 10 MG PO TABS: 10.0000 mg | ORAL_TABLET | Freq: Every day | ORAL | 0 refills | Status: DC

## 2017-05-04 MED ORDER — SODIUM CHLORIDE 0.9 % IV SOLN
INTRAVENOUS | Status: DC
  Administered 2017-05-04: 22:00:00 via INTRAVENOUS

## 2017-05-04 MED ORDER — ALUM & MAG HYDROXIDE-SIMETH 200-200-20 MG/5ML PO SUSP: 15.0000 mL | ORAL | Status: DC | PRN

## 2017-05-04 MED ORDER — EPHEDRINE SULFATE-NACL 50-0.9 MG/10ML-% IV SOSY
PREFILLED_SYRINGE | INTRAVENOUS | Status: DC | PRN
  Administered 2017-05-04: 5 mg via INTRAVENOUS

## 2017-05-04 MED ORDER — MIDAZOLAM HCL 2 MG/2ML IJ SOLN
INTRAMUSCULAR | Status: AC
  Filled 2017-05-04: qty 2

## 2017-05-04 MED ORDER — PROPOFOL 10 MG/ML IV BOLUS
INTRAVENOUS | Status: AC
  Filled 2017-05-04: qty 20

## 2017-05-04 MED ORDER — EPHEDRINE 5 MG/ML INJ
INTRAVENOUS | Status: AC
  Filled 2017-05-04: qty 10

## 2017-05-04 MED ORDER — ONDANSETRON HCL 4 MG PO TABS: 4.0000 mg | ORAL_TABLET | Freq: Four times a day (QID) | ORAL | Status: DC | PRN

## 2017-05-04 MED ORDER — METOCLOPRAMIDE HCL 5 MG/ML IJ SOLN: 5.0000 mg | Freq: Three times a day (TID) | INTRAMUSCULAR | Status: DC | PRN

## 2017-05-04 MED ORDER — PROPOFOL 10 MG/ML IV BOLUS
INTRAVENOUS | Status: DC | PRN
  Administered 2017-05-04: 20 mg via INTRAVENOUS

## 2017-05-04 MED ORDER — TRANEXAMIC ACID 1000 MG/10ML IV SOLN
INTRAVENOUS | Status: AC | PRN
  Administered 2017-05-04: 2000 mg via TOPICAL

## 2017-05-04 MED ORDER — METHOCARBAMOL 1000 MG/10ML IJ SOLN
500.0000 mg | Freq: Four times a day (QID) | INTRAVENOUS | Status: DC | PRN
  Filled 2017-05-04: qty 5

## 2017-05-04 MED ORDER — SODIUM CHLORIDE 0.9 % IR SOLN
Status: DC | PRN
  Administered 2017-05-04: 1000 mL

## 2017-05-04 SURGICAL SUPPLY — 38 items
ADH SKN CLS APL DERMABOND .7 (GAUZE/BANDAGES/DRESSINGS) ×1
BAG DECANTER FOR FLEXI CONT (MISCELLANEOUS) IMPLANT
BAG SPEC THK2 15X12 ZIP CLS (MISCELLANEOUS)
BAG ZIPLOCK 12X15 (MISCELLANEOUS) IMPLANT
BLADE SAG 18X100X1.27 (BLADE) ×2 IMPLANT
CAPT HIP TOTAL 2 ×2 IMPLANT
CLOTH BEACON ORANGE TIMEOUT ST (SAFETY) ×2 IMPLANT
COVER PERINEAL POST (MISCELLANEOUS) ×2 IMPLANT
COVER SURGICAL LIGHT HANDLE (MISCELLANEOUS) ×2 IMPLANT
DERMABOND ADVANCED (GAUZE/BANDAGES/DRESSINGS) ×1
DERMABOND ADVANCED .7 DNX12 (GAUZE/BANDAGES/DRESSINGS) ×1 IMPLANT
DRAPE STERI IOBAN 125X83 (DRAPES) ×2 IMPLANT
DRAPE U-SHAPE 47X51 STRL (DRAPES) ×4 IMPLANT
DRESSING AQUACEL AG SP 3.5X10 (GAUZE/BANDAGES/DRESSINGS) ×1 IMPLANT
DRSG AQUACEL AG ADV 3.5X10 (GAUZE/BANDAGES/DRESSINGS) ×2 IMPLANT
DRSG AQUACEL AG SP 3.5X10 (GAUZE/BANDAGES/DRESSINGS) ×2
DURAPREP 26ML APPLICATOR (WOUND CARE) ×2 IMPLANT
ELECT REM PT RETURN 15FT ADLT (MISCELLANEOUS) ×2 IMPLANT
GLOVE BIOGEL M STRL SZ7.5 (GLOVE) IMPLANT
GLOVE BIOGEL PI IND STRL 7.5 (GLOVE) ×1 IMPLANT
GLOVE BIOGEL PI IND STRL 9 (GLOVE) ×1 IMPLANT
GLOVE BIOGEL PI INDICATOR 7.5 (GLOVE) ×1
GLOVE BIOGEL PI INDICATOR 9 (GLOVE) ×1
GLOVE ECLIPSE 8.5 STRL (GLOVE) ×4 IMPLANT
GLOVE ORTHO TXT STRL SZ7.5 (GLOVE) ×2 IMPLANT
GOWN STRL REUS W/TWL 2XL LVL3 (GOWN DISPOSABLE) ×2 IMPLANT
GOWN STRL REUS W/TWL LRG LVL3 (GOWN DISPOSABLE) ×2 IMPLANT
HOLDER FOLEY CATH W/STRAP (MISCELLANEOUS) ×2 IMPLANT
PACK ANTERIOR HIP CUSTOM (KITS) ×2 IMPLANT
SUT MNCRL AB 4-0 PS2 18 (SUTURE) ×2 IMPLANT
SUT STRATAFIX 0 PDS 27 VIOLET (SUTURE)
SUT VIC AB 1 CT1 36 (SUTURE) ×6 IMPLANT
SUT VIC AB 2-0 CT1 27 (SUTURE) ×4
SUT VIC AB 2-0 CT1 TAPERPNT 27 (SUTURE) ×2 IMPLANT
SUTURE STRATFX 0 PDS 27 VIOLET (SUTURE) IMPLANT
TRAY FOLEY W/METER SILVER 16FR (SET/KITS/TRAYS/PACK) IMPLANT
WATER STERILE IRR 1000ML POUR (IV SOLUTION) ×2 IMPLANT
YANKAUER SUCT BULB TIP 10FT TU (MISCELLANEOUS) IMPLANT

## 2017-05-04 NOTE — Anesthesia Postprocedure Evaluation (Signed)
Anesthesia Post Note  Patient: Tonya Bartlett  Procedure(s) Performed: LEFT TOTAL HIP ARTHROPLASTY ANTERIOR APPROACH (Left Hip)     Patient location during evaluation: PACU Anesthesia Type: MAC Level of consciousness: oriented and awake and alert Pain management: pain level controlled Vital Signs Assessment: post-procedure vital signs reviewed and stable Respiratory status: spontaneous breathing, respiratory function stable and patient connected to nasal cannula oxygen Cardiovascular status: blood pressure returned to baseline and stable Postop Assessment: no headache, no backache and no apparent nausea or vomiting Anesthetic complications: no    Last Vitals:  Vitals:   05/04/17 1300 05/04/17 1315  BP: 119/76 118/86  Pulse: (!) 59 (!) 58  Resp: 11 16  Temp: (!) 36.3 C   SpO2: 100% 100%    Last Pain:  Vitals:   05/04/17 1345  TempSrc:   PainSc: 5                  [name] DAVID

## 2017-05-04 NOTE — Anesthesia Preprocedure Evaluation (Signed)
Anesthesia Evaluation  Patient identified by MRN, date of birth, ID band Patient awake    Reviewed: Allergy & Precautions, NPO status , Patient's Chart, lab work & pertinent test results  History of Anesthesia Complications (+) PONV  Airway Mallampati: II  TM Distance: >3 FB Neck ROM: Full    Dental   Pulmonary former smoker,    Pulmonary exam normal        Cardiovascular hypertension, Pt. on medications Normal cardiovascular exam     Neuro/Psych Depression    GI/Hepatic GERD  Medicated and Controlled,  Endo/Other    Renal/GU      Musculoskeletal   Abdominal   Peds  Hematology   Anesthesia Other Findings   Reproductive/Obstetrics                             Anesthesia Physical Anesthesia Plan  ASA: III  Anesthesia Plan: MAC and Spinal   Post-op Pain Management:    Induction: Intravenous  PONV Risk Score and Plan: 3 and Ondansetron, Dexamethasone and Midazolam  Airway Management Planned: Simple Face Mask  Additional Equipment:   Intra-op Plan:   Post-operative Plan:   Informed Consent: I have reviewed the patients History and Physical, chart, labs and discussed the procedure including the risks, benefits and alternatives for the proposed anesthesia with the patient or authorized representative who has indicated his/her understanding and acceptance.     Plan Discussed with: CRNA and Surgeon  Anesthesia Plan Comments:         Anesthesia Quick Evaluation

## 2017-05-04 NOTE — Anesthesia Procedure Notes (Signed)
Date/Time: 05/04/2017 10:38 AM Performed by: Glory Buff, CRNA Oxygen Delivery Method: Simple face mask

## 2017-05-04 NOTE — Transfer of Care (Signed)
Immediate Anesthesia Transfer of Care Note  Patient: [name]  Procedure(s) Performed: LEFT TOTAL HIP ARTHROPLASTY ANTERIOR APPROACH (Left Hip)  Patient Location: PACU  Anesthesia Type:Spinal  Level of Consciousness: awake, alert  and oriented  Airway & Oxygen Therapy: Patient Spontanous Breathing and Patient connected to face mask oxygen  Post-op Assessment: Report given to RN  Post vital signs: Reviewed and stable  Last Vitals:  Vitals:   05/04/17 0907  BP: 134/65  Pulse: 62  Resp: 16  Temp: 36.9 C  SpO2: 99%    Last Pain:  Vitals:   05/04/17 0907  TempSrc: Oral      Patients Stated Pain Goal: 3 (09/64/38 3818)  Complications: No apparent anesthesia complications

## 2017-05-04 NOTE — Evaluation (Signed)
Physical Therapy Evaluation Patient Details Name: Tonya Bartlett MRN: [id number] DOB: [birth year] Today's Date: 05/04/2017   History of Present Illness  L DATHA  Clinical Impression  The patient ambulated x 200'. Plans Dc home. Pt admitted with above diagnosis. Pt currently with functional limitations due to the deficits listed below (see PT Problem List). Pt will benefit from skilled PT to increase their independence and safety with mobility to allow discharge to the venue listed below.       Follow Up Recommendations Follow surgeon's recommendation for DC plan and follow-up therapies    Equipment Recommendations  None recommended by PT    Recommendations for Other Services       Precautions / Restrictions Precautions Precautions: Fall Restrictions Weight Bearing Restrictions: No      Mobility  Bed Mobility Overal bed mobility: Needs Assistance Bed Mobility: Supine to Sit     Supine to sit: Min guard     General bed mobility comments: manages left leg  Transfers Overall transfer level: Needs assistance Equipment used: Rolling walker (2 wheeled) Transfers: Sit to/from Stand Sit to Stand: Min assist         General transfer comment: cues for hand and left leg position  Ambulation/Gait Ambulation/Gait assistance: Min assist Ambulation Distance (Feet): 200 Feet Assistive device: Rolling walker (2 wheeled) Gait Pattern/deviations: Step-to pattern;Step-through pattern     General Gait Details: cues for sequence  Stairs            Wheelchair Mobility    Modified Rankin (Stroke Patients Only)       Balance                                             Pertinent Vitals/Pain Pain Assessment: 0-10 Pain Score: 3  Pain Descriptors / Indicators: Burning Pain Intervention(s): Monitored during session;Premedicated before session;Ice applied    Home Living Family/patient expects to be discharged to:: Private residence Living  Arrangements: Children Available Help at Discharge: Family Type of Home: House Home Access: Stairs to enter Entrance Stairs-Rails: Left Entrance Stairs-Number of Steps: 7 Home Layout: One level Home Equipment: Environmental consultant - 2 wheels      Prior Function Level of Independence: Independent               Hand Dominance        Extremity/Trunk Assessment   Upper Extremity Assessment Upper Extremity Assessment: Overall WFL for tasks assessed    Lower Extremity Assessment Lower Extremity Assessment: LLE deficits/detail LLE Deficits / Details: able to advance the left leg       Communication      Cognition Arousal/Alertness: Awake/alert Behavior During Therapy: WFL for tasks assessed/performed Overall Cognitive Status: Within Functional Limits for tasks assessed                                        General Comments      Exercises     Assessment/Plan    PT Assessment Patient needs continued PT services  PT Problem List Decreased strength;Decreased range of motion;Decreased mobility;Decreased knowledge of precautions;Decreased safety awareness;Decreased knowledge of use of DME;Pain       PT Treatment Interventions DME instruction;Gait training;Stair training;Functional mobility training;Patient/family education;Therapeutic activities;Therapeutic exercise    PT Goals (Current goals can be found in the  Care Plan section)  Acute Rehab PT Goals Patient Stated Goal: to go home PT Goal Formulation: With patient/family Time For Goal Achievement: 05/08/17 Potential to Achieve Goals: Good    Frequency 7X/week   Barriers to discharge        Co-evaluation               AM-PAC PT "6 Clicks" Daily Activity  Outcome Measure Difficulty turning over in bed (including adjusting bedclothes, sheets and blankets)?: A Little Difficulty moving from lying on back to sitting on the side of the bed? : A Little Difficulty sitting down on and standing up from  a chair with arms (e.g., wheelchair, bedside commode, etc,.)?: A Little Help needed moving to and from a bed to chair (including a wheelchair)?: A Little Help needed walking in hospital room?: A Little Help needed climbing 3-5 steps with a railing? : Total 6 Click Score: 16    End of Session   Activity Tolerance: Patient tolerated treatment well Patient left: with call bell/phone within reach;with family/visitor present Nurse Communication: Mobility status PT Visit Diagnosis: Unsteadiness on feet (R26.81)    Time: 1093-2355 PT Time Calculation (min) (ACUTE ONLY): 20 min   Charges:   PT Evaluation $PT Eval Low Complexity: 1 Low     PT G CodesTresa Endo PT [phone]  Claretha Cooper 05/04/2017, 5:36 PM

## 2017-05-04 NOTE — Discharge Instructions (Addendum)

## 2017-05-04 NOTE — Op Note (Signed)
NAME:  Tonya Bartlett                ACCOUNT NO.: [id number]      MEDICAL RECORD NO.: [id number]      FACILITY:  [facility]      PHYSICIAN:  Mauri Pole  DATE OF BIRTH:  1945-05-12     DATE OF PROCEDURE:  05/04/2017                                 OPERATIVE REPORT         PREOPERATIVE DIAGNOSIS: Left  hip osteoarthritis.      POSTOPERATIVE DIAGNOSIS:  Left hip osteoarthritis.      PROCEDURE:  Left total hip replacement through an anterior approach   utilizing DePuy THR system, component size 69mm pinnacle cup, a size 36+4 neutral   Altrex liner, a size 4 Standard Tri Lock stem with a 36+5 delta ceramic   ball.      SURGEON:  Pietro Cassis. Alvan Dame, M.D.      ASSISTANT:  Danae Orleans, PA-C     ANESTHESIA:  Spinal.      SPECIMENS:  None.      COMPLICATIONS:  None.      BLOOD LOSS:  350 cc     DRAINS:  None.      INDICATION OF THE PROCEDURE:  Tonya Bartlett is a 72 y.o. female who had   presented to office for evaluation of left hip pain.  Radiographs revealed   progressive degenerative changes with bone-on-bone   articulation to the  hip joint.  The patient had painful limited range of   motion significantly affecting their overall quality of life.  The patient was failing to    respond to conservative measures, and at this point was ready   to proceed with more definitive measures.  The patient has noted progressive   degenerative changes in his hip, progressive problems and dysfunction   with regarding the hip prior to surgery.  Consent was obtained for   benefit of pain relief.  Specific risk of infection, DVT, component   failure, dislocation, need for revision surgery, as well discussion of   the anterior versus posterior approach were reviewed.  Consent was   obtained for benefit of anterior pain relief through an anterior   approach.      PROCEDURE IN DETAIL:  The patient was brought to operative theater.   Once adequate anesthesia,  preoperative antibiotics, 1 gm of Vancomycin, weight dosed Gentamycin, and 10 mg of Decadron administered.   The patient was positioned supine on the OSI Hanna table.  Once adequate   padding of boney process was carried out, we had predraped out the hip, and  used fluoroscopy to confirm orientation of the pelvis and position.      The left hip was then prepped and draped from proximal iliac crest to   mid thigh with shower curtain technique.      Time-out was performed identifying the patient, planned procedure, and   extremity.     An incision was then made 2 cm distal and lateral to the   anterior superior iliac spine extending over the orientation of the   tensor fascia lata muscle and sharp dissection was carried down to the   fascia of the muscle and protractor placed in the soft tissues.      The fascia was  then incised.  The muscle belly was identified and swept   laterally and retractor placed along the superior neck.  Following   cauterization of the circumflex vessels and removing some pericapsular   fat, a second cobra retractor was placed on the inferior neck.  A third   retractor was placed on the anterior acetabulum after elevating the   anterior rectus.  A L-capsulotomy was along the line of the   superior neck to the trochanteric fossa, then extended proximally and   distally.  Tag sutures were placed and the retractors were then placed   intracapsular.  We then identified the trochanteric fossa and   orientation of my neck cut, confirmed this radiographically   and then made a neck osteotomy with the femur on traction.  The femoral   head was removed without difficulty or complication.  Traction was let   off and retractors were placed posterior and anterior around the   acetabulum.      The labrum and foveal tissue were debrided.  I began reaming with a 6mm   reamer and reamed up to 23mm reamer with good bony bed preparation and a 46mm   cup was chosen.  The final  42mm Pinnacle cup was then impacted under fluoroscopy  to confirm the depth of penetration and orientation with respect to   abduction.  A screw was placed followed by the hole eliminator.  The final   36+4 Altrex liner was impacted with good visualized rim fit.  The cup was positioned anatomically within the acetabular portion of the pelvis.      At this point, the femur was rolled at 80 degrees.  Further capsule was   released off the inferior aspect of the femoral neck.  I then   released the superior capsule proximally.  The hook was placed laterally   along the femur and elevated manually and held in position with the bed   hook.  The leg was then extended and adducted with the leg rolled to 100   degrees of external rotation.  Once the proximal femur was fully   exposed, I used a box osteotome to set orientation.  I then began   broaching with the starting chili pepper broach and passed this by hand and then broached up to 4.  With the 4 broach in place I chose a high then standard offset neck and did trial reductions.  The offset was appropriate, leg lengths   appeared to be equal best matched with the +5 head ball confirmed radiographically.   Given these findings, I went ahead and dislocated the hip, repositioned all   retractors and positioned the right hip in the extended and abducted position.  The final 4 Standard Tri Lock stem was   chosen and it was impacted down to the level of neck cut.  Based on this   and the trial reduction, a 36+5 delta ceramic ball was chosen and   impacted onto a clean and dry trunnion, and the hip was reduced.  The   hip had been irrigated throughout the case again at this point.  I did   reapproximate the superior capsular leaflet to the anterior leaflet   using #1 Vicryl.  The fascia of the   tensor fascia lata muscle was then reapproximated using #1 Vicryl and #1 Stratafix .  The   remaining wound was closed with 2-0 Vicryl and running 4-0 Monocryl.    The hip was cleaned, dried, and  dressed sterilely using Dermabond and   Aquacel dressing.  She was then brought   to recovery room in stable condition tolerating the procedure well.    Danae Orleans, PA-C was present for the entirety of the case involved from   preoperative positioning, perioperative retractor management, general   facilitation of the case, as well as primary wound closure as assistant.            Pietro Cassis Alvan Dame, M.D.        05/04/2017 12:00 PM

## 2017-05-04 NOTE — Interval H&P Note (Signed)
History and Physical Interval Note:  05/04/2017 9:35 AM  Tonya Bartlett  has presented today for surgery, with the diagnosis of Left hip osteoarthritis  The various methods of treatment have been discussed with the patient and family. After consideration of risks, benefits and other options for treatment, the patient has consented to  Procedure(s): LEFT TOTAL HIP ARTHROPLASTY ANTERIOR APPROACH (Left) as a surgical intervention .  The patient's history has been reviewed, patient examined, no change in status, stable for surgery.  I have reviewed the patient's chart and labs.  Questions were answered to the patient's satisfaction.     Mauri Pole

## 2017-05-04 NOTE — Anesthesia Procedure Notes (Signed)
Spinal  Patient location during procedure: OR Start time: 05/04/2017 10:47 AM End time: 05/04/2017 10:50 AM Staffing Anesthesiologist: Lillia Abed, MD Performed: anesthesiologist  Preanesthetic Checklist Completed: patient identified, surgical consent, pre-op evaluation, timeout performed, IV checked, risks and benefits discussed and monitors and equipment checked Spinal Block Patient position: sitting Prep: ChloraPrep Patient monitoring: cardiac monitor, continuous pulse ox, blood pressure and heart rate Approach: right paramedian Location: L3-4 Injection technique: single-shot Needle Needle type: Pencan  Needle gauge: 24 G Needle length: 9 cm Needle insertion depth: 8 cm

## 2017-05-05 DIAGNOSIS — E669 Obesity, unspecified: Secondary | ICD-10-CM | POA: Diagnosis present

## 2017-05-05 LAB — BASIC METABOLIC PANEL
Anion gap: 8 (ref 5–15)
BUN: 7 mg/dL (ref 6–20)
CALCIUM: 8.5 mg/dL — AB (ref 8.9–10.3)
CHLORIDE: 105 mmol/L (ref 101–111)
CO2: 26 mmol/L (ref 22–32)
CREATININE: 0.57 mg/dL (ref 0.44–1.00)
GFR calc Af Amer: >60 mL/min (ref >60)
GFR calc non Af Amer: >60 mL/min (ref >60)
GLUCOSE: 121 mg/dL — AB (ref 65–99)
Potassium: 4.4 mmol/L (ref 3.5–5.1)
Sodium: 139 mmol/L (ref 135–145)

## 2017-05-05 LAB — CBC
HEMATOCRIT: 30.3 % — AB (ref 36.0–46.0)
HEMOGLOBIN: 9.9 g/dL — AB (ref 12.0–15.0)
MCH: 32.5 pg (ref 26.0–34.0)
MCHC: 32.7 g/dL (ref 30.0–36.0)
MCV: 99.3 fL (ref 78.0–100.0)
Platelets: 250 10*3/uL (ref 150–400)
RBC: 3.05 MIL/uL — ABNORMAL LOW (ref 3.87–5.11)
RDW: 12.7 % (ref 11.5–15.5)
WBC: 9.7 10*3/uL (ref 4.0–10.5)

## 2017-05-05 MED ORDER — TRAMADOL HCL 50 MG PO TABS
50.0000 mg | ORAL_TABLET | Freq: Four times a day (QID) | ORAL | Status: DC
  Administered 2017-05-05: 11:00:00 100 mg via ORAL
  Filled 2017-05-05: qty 2

## 2017-05-05 MED ORDER — TRAMADOL HCL 50 MG PO TABS: 50.0000 mg | ORAL_TABLET | Freq: Four times a day (QID) | ORAL | 0 refills | Status: AC | PRN

## 2017-05-05 NOTE — Progress Notes (Signed)
Physical Therapy Treatment Patient Details Name: NYHLA MOUNTJOY MRN: [id number] DOB: Nov 13, 1945 Today's Date: 05/05/2017    History of Present Illness L DATHA    PT Comments    Ready for DC   Follow Up Recommendations  Follow surgeon's recommendation for DC plan and follow-up therapies     Equipment Recommendations  None recommended by PT    Recommendations for Other Services       Precautions / Restrictions Precautions Precautions: Fall    Mobility  Bed Mobility   Bed Mobility: Supine to Sit;Sit to Supine           General bed mobility comments: manages left leg  Transfers Overall transfer level: Needs assistance Equipment used: Rolling walker (2 wheeled) Transfers: Sit to/from Stand Sit to Stand: Supervision         General transfer comment: cues for hand and left leg position  Ambulation/Gait Ambulation/Gait assistance: Supervision Ambulation Distance (Feet): 40 Feet Assistive device: Rolling walker (2 wheeled) Gait Pattern/deviations: Step-to pattern     General Gait Details: cues for sequence   Stairs Stairs: Yes   Stair Management: Step to pattern;Forwards;With cane;One rail Left Number of Stairs: 8 General stair comments: cues for sequence and safety  Wheelchair Mobility    Modified Rankin (Stroke Patients Only)       Balance                                            Cognition Arousal/Alertness: Awake/alert                                            Exercises Total Joint Exercises Ankle Circles/Pumps: AROM;Both;10 reps Quad Sets: AROM;Both;10 reps Short Arc Quad: AROM;Left;10 reps Heel Slides: AROM;Left;10 reps Hip ABduction/ADduction: AROM;Standing;Left;10 reps Long Arc Quad: AROM;Left;10 reps Knee Flexion: AROM;Left;10 reps;Standing Marching in Standing: Left;10 reps;Standing    General Comments        Pertinent Vitals/Pain Pain Score: 3  Pain Location: left thigh Pain  Descriptors / Indicators: Burning Pain Intervention(s): Monitored during session    Home Living                      Prior Function            PT Goals (current goals can now be found in the care plan section) Progress towards PT goals: Progressing toward goals    Frequency    7X/week      PT Plan Current plan remains appropriate    Co-evaluation              AM-PAC PT "6 Clicks" Daily Activity  Outcome Measure  Difficulty turning over in bed (including adjusting bedclothes, sheets and blankets)?: A Little Difficulty moving from lying on back to sitting on the side of the bed? : A Little Difficulty sitting down on and standing up from a chair with arms (e.g., wheelchair, bedside commode, etc,.)?: A Little Help needed moving to and from a bed to chair (including a wheelchair)?: A Little Help needed walking in hospital room?: A Little Help needed climbing 3-5 steps with a railing? : A Little 6 Click Score: 18    End of Session   Activity Tolerance: Patient tolerated treatment well Patient left: in bed;with family/visitor  present Nurse Communication: Mobility status PT Visit Diagnosis: Unsteadiness on feet (R26.81);Pain Pain - Right/Left: Left Pain - part of body: Hip     Time: 1310-1330 PT Time Calculation (min) (ACUTE ONLY): 20 min  Charges:  $Gait Training: 8-22 mins                    G Codes:          Claretha Cooper 05/05/2017, 2:59 PM

## 2017-05-05 NOTE — Progress Notes (Signed)
Physical Therapy Treatment Patient Details Name: MELVENIA FAVELA MRN: [id number] DOB: [birth year] Today's Date: 05/05/2017    History of Present Illness L DATHA    PT Comments    The patient is progressing well. Will see again then to DC.    Follow Up Recommendations  Follow surgeon's recommendation for DC plan and follow-up therapies     Equipment Recommendations  None recommended by PT    Recommendations for Other Services       Precautions / Restrictions Precautions Precautions: Fall    Mobility  Bed Mobility   Bed Mobility: Supine to Sit           General bed mobility comments: manages left leg  Transfers Overall transfer level: Needs assistance Equipment used: Rolling walker (2 wheeled) Transfers: Sit to/from Stand Sit to Stand: Supervision         General transfer comment: cues for hand and left leg position  Ambulation/Gait Ambulation/Gait assistance: Supervision Ambulation Distance (Feet): 120 Feet Assistive device: Rolling walker (2 wheeled) Gait Pattern/deviations: Step-to pattern;Step-through pattern     General Gait Details: cues for sequence   Stairs Stairs: Yes   Stair Management: Step to pattern;Forwards;With cane;One rail Left Number of Stairs: 8 General stair comments: cues for sequence and safety  Wheelchair Mobility    Modified Rankin (Stroke Patients Only)       Balance                                            Cognition                                              Exercises Total Joint Exercises Ankle Circles/Pumps: AROM;Both;10 reps Quad Sets: AROM;Both;10 reps Short Arc Quad: AROM;Left;10 reps Heel Slides: AROM;Left;10 reps Hip ABduction/ADduction: AROM;Left;10 reps Long Arc Quad: AROM;Left;10 reps    General Comments        Pertinent Vitals/Pain Pain Score: 3  Pain Location: left thigh Pain Descriptors / Indicators: Burning Pain Intervention(s): Monitored  during session;Premedicated before session;Ice applied    Home Living                      Prior Function            PT Goals (current goals can now be found in the care plan section) Progress towards PT goals: Progressing toward goals    Frequency    7X/week      PT Plan Current plan remains appropriate    Co-evaluation              AM-PAC PT "6 Clicks" Daily Activity  Outcome Measure  Difficulty turning over in bed (including adjusting bedclothes, sheets and blankets)?: A Little Difficulty moving from lying on back to sitting on the side of the bed? : A Little Difficulty sitting down on and standing up from a chair with arms (e.g., wheelchair, bedside commode, etc,.)?: A Little Help needed moving to and from a bed to chair (including a wheelchair)?: A Little Help needed walking in hospital room?: A Little Help needed climbing 3-5 steps with a railing? : A Little 6 Click Score: 18    End of Session   Activity Tolerance: Patient tolerated treatment well Patient left: in  chair;with call bell/phone within reach Nurse Communication: Mobility status PT Visit Diagnosis: Unsteadiness on feet (R26.81);Pain Pain - Right/Left: Left Pain - part of body: Hip     Time: 7207-2182 PT Time Calculation (min) (ACUTE ONLY): 49 min  Charges:  $Gait Training: 8-22 mins $Therapeutic Exercise: 8-22 mins $Self Care/Home Management: 10-19-2022                    G Codes:         Claretha Cooper 05/05/2017, 2:55 PM

## 2017-05-05 NOTE — Progress Notes (Signed)
     Subjective: 1 Day Post-Op Procedure(s) (LRB): LEFT TOTAL HIP ARTHROPLASTY ANTERIOR APPROACH (Left)   Patient reports pain as mild, pain controlled.  No events throughout the night. Would like to go home on tramadol only.  She does get pain medicine from her doctor and would like to continue receiving it from him.   Objective:   VITALS:   Vitals:   05/05/17 0506 05/05/17 0913  BP: (!) 130/53 131/67  Pulse: (!) 58 60  Resp: 15 16  Temp: 97.8 F (36.6 C) 98.6 F (37 C)  SpO2: 97% 98%    Dorsiflexion/Plantar flexion intact Incision: dressing C/D/I No cellulitis present Compartment soft  LABS Recent Labs    05/05/17 0607  HGB 9.9*  HCT 30.3*  WBC 9.7  PLT 250    Recent Labs    05/05/17 0607  NA 139  K 4.4  BUN 7  CREATININE 0.57  GLUCOSE 121*     Assessment/Plan: 1 Day Post-Op Procedure(s) (LRB): LEFT TOTAL HIP ARTHROPLASTY ANTERIOR APPROACH (Left) Foley cath d/c'ed Advance diet Up with therapy D/C IV fluids Discharge home Follow up in 2 weeks at [facility]. Follow up with OLIN,Ximenna Fonseca D in 2 weeks.  Contact information:  [name] [address], [place] [phone]    Obese (BMI 30-39.9) Estimated body mass index is 36.88 kg/m as calculated from the following:   Height as of this encounter: 5' 6.5" (1.689 m).   Weight as of this encounter: 105.2 kg (232 lb). Patient also counseled that weight may inhibit the healing process Patient counseled that losing weight will help with future health issues      West Pugh. Caffie Sotto   PAC  05/05/2017, 9:37 AM

## 2017-05-10 NOTE — Discharge Summary (Signed)
Physician Discharge Summary  Patient ID: Tonya Bartlett MRN: [id number] DOB/AGE: 1945/07/09 72 y.o.  Admit date: 05/04/2017 Discharge date: 05/05/2017   Procedures:  Procedure(s) (LRB): LEFT TOTAL HIP ARTHROPLASTY ANTERIOR APPROACH (Left)  Attending Physician:  Dr. Paralee Cancel   Admission Diagnoses:   Left hip primary OA / pain  Discharge Diagnoses:  Principal Problem:   S/P left THA, AA Active Problems:   Obese  Past Medical History:  Diagnosis Date  . Allergic rhinitis   . Colitis   . Complication of anesthesia   . Depression   . Diverticulosis   . Frequency of urination   . GERD (gastroesophageal reflux disease)   . 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 History of hiatal hernia   . History of iron deficiency anemia   . History of migraine headaches   . Hyperlipidemia   . IBS (irritable bowel syndrome)   . Memory loss   . Migraine headache    hx of migraines   . OA (osteoarthritis)    RIGHT SHOULDER  . PONV (postoperative nausea and vomiting)   . Recovering alcoholic ([place])    SINCE [id number]  . Right rotator cuff tear   . Unspecified essential hypertension     HPI:    Tonya Bartlett, 72 y.o. female, has a history of pain and functional disability in the left hip(s) due to arthritis and patient has failed non-surgical conservative treatments for greater than 12 weeks to include NSAID's and/or analgesics, corticosteriod injections and activity modification.  Onset of symptoms was gradual starting 2-3 years ago with gradually worsening course since that time.The patient noted no past surgery on the left hip.  Patient currently rates pain in the left hip at 7 out of 10 with activity. Patient has night pain, worsening of pain with activity and weight bearing, trendelenberg gait, pain that interfers with activities of daily living and pain with passive range of motion. Patient has evidence of periarticular osteophytes and joint space  narrowing by imaging studies. This condition presents safety issues increasing the risk of falls.  There is no current active infection.  Risks, benefits and expectations were discussed with the patient.  Risks including but not limited to the risk of anesthesia, blood clots, nerve damage, blood vessel damage, failure of the prosthesis, infection and up to and including death.  Patient understand the risks, benefits and expectations and wishes to proceed with surgery.   PCP: [name], NP   Discharged Condition: good  Hospital Course:  Patient underwent the above stated procedure on 05/04/2017. Patient tolerated the procedure well and brought to the recovery room in good condition and subsequently to the floor.  POD #1 BP: 131/67 ; Pulse: 60 ; Temp: 98.6 F (37 C) ; Resp: 16 Patient reports pain as mild, pain controlled.  No events throughout the night. Would like to go home on tramadol only.  She does get pain medicine from her doctor and would like to continue receiving it from him. Ready to be discharged home. Dorsiflexion/plantar flexion intact, incision: dressing C/D/I, no cellulitis present and compartment soft.   LABS  Basename    HGB     9.9  HCT     30.3    Discharge Exam: General appearance: alert, cooperative and no distress Extremities: Homans sign is negative, no sign of DVT, no edema, redness or tenderness in the calves or thighs and no ulcers, gangrene or trophic changes  Disposition:  Home with follow up in 2 weeks   Follow-up Information    Paralee Cancel, MD. Schedule an appointment as soon as possible for a visit in 2 week(s).   Specialty:  Orthopedic Surgery Contact information: [address] [place] [address] [phone]           Discharge Instructions    Call MD / Call 911   Complete by:  As directed    If you experience chest pain or shortness of breath, CALL 911 and be transported to the hospital emergency room.  If you  develope a fever above 101 F, pus (white drainage) or increased drainage or redness at the wound, or calf pain, call your surgeon's office.   Change dressing   Complete by:  As directed    Maintain surgical dressing until follow up in the clinic. If the edges start to pull up, may reinforce with tape. If the dressing is no longer working, may remove and cover with gauze and tape, but must keep the area dry and clean.  Call with any questions or concerns.   Constipation Prevention   Complete by:  As directed    Drink plenty of fluids.  Prune juice may be helpful.  You may use a stool softener, such as Colace (over the counter) 100 mg twice a day.  Use MiraLax (over the counter) for constipation as needed.   Diet - low sodium heart healthy   Complete by:  As directed    Discharge instructions   Complete by:  As directed    Maintain surgical dressing until follow up in the clinic. If the edges start to pull up, may reinforce with tape. If the dressing is no longer working, may remove and cover with gauze and tape, but must keep the area dry and clean.  Follow up in 2 weeks at [facility]. Call with any questions or concerns.   Increase activity slowly as tolerated   Complete by:  As directed    Weight bearing as tolerated with assist device (walker, cane, etc) as directed, use it as long as suggested by your surgeon or therapist, typically at least 4-6 weeks.   TED hose   Complete by:  As directed    Use stockings (TED hose) for 2 weeks on both leg(s).  You may remove them at night for sleeping.      Allergies as of 05/05/2017      Reactions   Penicillins Anaphylaxis   Has patient had a PCN reaction causing immediate rash, facial/tongue/throat swelling, SOB or lightheadedness with hypotension: Yes Has patient had a PCN reaction causing severe rash involving mucus membranes or skin necrosis: Yes Has patient had a PCN reaction that required hospitalization: No Has patient had a PCN  reaction occurring within the last 10 year No If all of the above answers are "NO", then may proceed with Cephalosporin use.   Erythromycin Other (See Comments)   SEVERE STOMACH CRAMPS   Morphine And Related Nausea And Vomiting   Nsaids Other (See Comments)   SEVERE STOMACH CRAMPS, MOUTH SORES **Able to tolerate Tylenol   Nickel Rash   Including snaps on hospital gowns       Medication List    STOP taking these medications   acetaminophen 500 MG tablet Commonly known as:  TYLENOL   HYDROcodone-acetaminophen 5-325 MG tablet Commonly known as:  NORCO/VICODIN     TAKE these medications   atorvastatin 20 MG tablet Commonly known as:  LIPITOR Take 1 tablet (20 mg total) by mouth daily.   betamethasone dipropionate 0.05 % lotion Apply topically 2 (two) times daily. What changed:    how much to take  when to take this   buPROPion 150 MG 24 hr tablet Commonly known as:  WELLBUTRIN XL Take 1 tablet (150 mg total) by mouth daily.   CEREFOLIN 07-17-48-5 MG Tabs Take one caplet by mouth daily.   cetirizine 10 MG tablet Commonly known as:  ZYRTEC Take 10 mg by mouth daily.   docusate sodium 100 MG capsule Commonly known as:  COLACE Take 1 capsule (100 mg total) by mouth 2 (two) times daily.   ferrous sulfate 325 (65 FE) MG tablet Commonly known as:  FERROUSUL Take 1 tablet (325 mg total) by mouth 3 (three) times daily with meals.   hydrocortisone cream 1 % Apply 1 application topically daily as needed for itching.   losartan 50 MG tablet Commonly known as:  COZAAR TAKE 1 TABLET(50 MG) BY MOUTH DAILY   LUBRICATING EYE DROPS OP Place 1 drop into both eyes daily.   methocarbamol 500 MG tablet Commonly known as:  ROBAXIN Take 1 tablet (500 mg total) by mouth every 6 (six) hours as needed for muscle spasms.   omeprazole 20 MG capsule Commonly known as:  PRILOSEC Take 20 mg by mouth daily. What changed:  Another medication with the same name was removed. Continue  taking this medication, and follow the directions you see here.   polyethylene glycol packet Commonly known as:  MIRALAX / GLYCOLAX Take 17 g by mouth 2 (two) times daily.   psyllium 0.52 g capsule Commonly known as:  REGULOID Take 0.52 g by mouth daily.   rivaroxaban 10 MG Tabs tablet Commonly known as:  XARELTO Take 1 tablet (10 mg total) by mouth daily.   SUMAtriptan 100 MG tablet Commonly known as:  IMITREX Take 1 tablet (100 mg total) by mouth every 2 (two) hours as needed for migraine. May repeat in 2 hours if headache persists or recurs. What changed:    how much to take  additional instructions   traMADol 50 MG tablet Commonly known as:  ULTRAM Take 1-2 tablets (50-100 mg total) by mouth every 6 (six) hours as needed for up to 5 days.   traZODone 50 MG tablet Commonly known as:  DESYREL TAKE 1 TABLET(50 MG) BY MOUTH AT BEDTIME AS NEEDED FOR SLEEP What changed:  See the new instructions.            Discharge Care Instructions  (From admission, onward)        Start     Ordered   05/05/17 0000  Change dressing    Comments:  Maintain surgical dressing until follow up in the clinic. If the edges start to pull up, may reinforce with tape. If the dressing is no longer working, may remove and cover with gauze and tape, but must keep the area dry and clean.  Call with any questions or concerns.   05/05/17 0941       Signed: [place] Pugh. Sharice Harriss   PA-C  05/10/2017, 11:30 AM

## 2017-05-19 DIAGNOSIS — Z471 Aftercare following joint replacement surgery: Secondary | ICD-10-CM | POA: Diagnosis not present

## 2017-05-19 DIAGNOSIS — Z96642 Presence of left artificial hip joint: Secondary | ICD-10-CM | POA: Diagnosis not present

## 2017-05-22 ENCOUNTER — Other Ambulatory Visit: Payer: Self-pay | Admitting: Gastroenterology

## 2017-05-24 DIAGNOSIS — Z4789 Encounter for other orthopedic aftercare: Secondary | ICD-10-CM | POA: Diagnosis not present

## 2017-05-24 DIAGNOSIS — Z96642 Presence of left artificial hip joint: Secondary | ICD-10-CM | POA: Diagnosis not present

## 2017-05-24 DIAGNOSIS — Z471 Aftercare following joint replacement surgery: Secondary | ICD-10-CM | POA: Diagnosis not present

## 2017-05-25 ENCOUNTER — Other Ambulatory Visit: Payer: Self-pay | Admitting: Physician Assistant

## 2017-05-25 ENCOUNTER — Ambulatory Visit
Admission: RE | Admit: 2017-05-25 | Discharge: 2017-05-25 | Disposition: A | Discharge status: Home / Self Care | Payer: Medicare Other | Source: Ambulatory Visit | Attending: Physician Assistant | Admitting: Physician Assistant

## 2017-05-25 DIAGNOSIS — Z96642 Presence of left artificial hip joint: Secondary | ICD-10-CM | POA: Diagnosis not present

## 2017-05-25 DIAGNOSIS — Z471 Aftercare following joint replacement surgery: Secondary | ICD-10-CM | POA: Diagnosis not present

## 2017-05-25 DIAGNOSIS — Z4789 Encounter for other orthopedic aftercare: Secondary | ICD-10-CM

## 2017-05-25 DIAGNOSIS — M25552 Pain in left hip: Secondary | ICD-10-CM

## 2017-05-30 ENCOUNTER — Other Ambulatory Visit: Payer: Self-pay | Admitting: Adult Health

## 2017-06-02 ENCOUNTER — Other Ambulatory Visit: Payer: Self-pay | Admitting: Adult Health

## 2017-06-02 DIAGNOSIS — F339 Major depressive disorder, recurrent, unspecified: Secondary | ICD-10-CM

## 2017-06-02 NOTE — Telephone Encounter (Signed)
PATIENT IS NO LONGER TAKING THIS MEDICATION.  MESSAGE SENT TO THE PHARMACY TO DISCONTINUE.

## 2017-06-07 ENCOUNTER — Other Ambulatory Visit: Payer: Self-pay | Admitting: Adult Health

## 2017-06-07 DIAGNOSIS — Z760 Encounter for issue of repeat prescription: Secondary | ICD-10-CM

## 2017-06-08 NOTE — Telephone Encounter (Signed)
Ok to refill 

## 2017-06-08 NOTE — Telephone Encounter (Signed)
Sent to the pharmacy by e-scribe. 

## 2017-06-16 DIAGNOSIS — Z96642 Presence of left artificial hip joint: Secondary | ICD-10-CM | POA: Diagnosis not present

## 2017-06-16 DIAGNOSIS — T8484XA Pain due to internal orthopedic prosthetic devices, implants and grafts, initial encounter: Secondary | ICD-10-CM | POA: Diagnosis not present

## 2017-06-16 DIAGNOSIS — Z471 Aftercare following joint replacement surgery: Secondary | ICD-10-CM | POA: Diagnosis not present

## 2017-06-21 ENCOUNTER — Ambulatory Visit: Payer: Self-pay | Admitting: Neurology

## 2017-06-22 ENCOUNTER — Ambulatory Visit: Payer: Self-pay | Admitting: Neurology

## 2017-07-05 ENCOUNTER — Ambulatory Visit (INDEPENDENT_AMBULATORY_CARE_PROVIDER_SITE_OTHER): Payer: Medicare Other | Admitting: Family Medicine

## 2017-07-05 ENCOUNTER — Encounter: Payer: Self-pay | Admitting: Family Medicine

## 2017-07-05 VITALS — BP 132/76 | HR 99 | Temp 98.3°F | Ht 66.5 in | Wt 221.0 lb

## 2017-07-05 DIAGNOSIS — R350 Frequency of micturition: Secondary | ICD-10-CM | POA: Diagnosis not present

## 2017-07-05 DIAGNOSIS — N3000 Acute cystitis without hematuria: Secondary | ICD-10-CM

## 2017-07-05 LAB — POCT URINALYSIS DIPSTICK
BILIRUBIN UA: NEGATIVE
GLUCOSE UA: NEGATIVE
Ketones, UA: NEGATIVE
NITRITE UA: NEGATIVE
ODOR: NEGATIVE
PH UA: 7 (ref 5.0–8.0)
Protein, UA: NEGATIVE
Spec Grav, UA: 1.015 (ref 1.010–1.025)
UROBILINOGEN UA: 1 U/dL

## 2017-07-05 MED ORDER — NITROFURANTOIN MONOHYD MACRO 100 MG PO CAPS: 100.0000 mg | ORAL_CAPSULE | Freq: Two times a day (BID) | ORAL | 0 refills | Status: AC

## 2017-07-05 MED ORDER — PHENAZOPYRIDINE HCL 200 MG PO TABS: 200.0000 mg | ORAL_TABLET | Freq: Three times a day (TID) | ORAL | 0 refills | Status: AC | PRN

## 2017-07-05 NOTE — Progress Notes (Signed)
Subjective:    Patient ID: Tonya Bartlett, female    DOB: 09-Jun-1945, 72 y.o.   MRN: [id number]  Chief Complaint  Patient presents with  . Urinary Tract Infection    Frequent urination, burning, and chills. Denies fevers. Symptoms present for 3 days. She has been taking Azo.     HPI Patient was seen today for acute concern.  Pt endorses constant burning, urinary frequency, chills, and nausea which started on Friday night.  Pt has taken Azo pills for her symptoms and has been drinking "plenty of water".  Pt states she has an allergy to penicillin and Cipro never works for her UTIs.  Pt denies headaches, fever, vomiting.  Pt tearful at one point during conversation.  She states she had a hip replacement 9 weeks ago "but it is failing, rubbing against nerve fibers".  She states she has to get another surgery.    Past Medical History:  Diagnosis Date  . Allergic rhinitis   . Colitis   . Complication of anesthesia   . Depression   . Diverticulosis   . Frequency of urination   . GERD (gastroesophageal reflux disease)   . 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 History of hiatal hernia   . History of iron deficiency anemia   . History of migraine headaches   . Hyperlipidemia   . IBS (irritable bowel syndrome)   . Memory loss   . Migraine headache    hx of migraines   . OA (osteoarthritis)    RIGHT SHOULDER  . PONV (postoperative nausea and vomiting)   . Recovering alcoholic ([place])    SINCE [id number]  . Right rotator cuff tear   . Unspecified essential hypertension     Allergies  Allergen Reactions  . Penicillins Anaphylaxis    Has patient had a PCN reaction causing immediate rash, facial/tongue/throat swelling, SOB or lightheadedness with hypotension: Yes Has patient had a PCN reaction causing severe rash involving mucus membranes or skin necrosis: Yes Has patient had a PCN reaction that required hospitalization: No Has patient had a PCN reaction  occurring within the last 10 year No If all of the above answers are "NO", then may proceed with Cephalosporin use.   . Erythromycin Other (See Comments)    SEVERE STOMACH CRAMPS  . Morphine And Related Nausea And Vomiting  . Nsaids Other (See Comments)    SEVERE STOMACH CRAMPS, MOUTH SORES **Able to tolerate Tylenol  . Nickel Rash    Including snaps on hospital gowns     ROS General: Denies fever, night sweats, changes in weight, changes in appetite  +chills HEENT: Denies headaches, ear pain, changes in vision, rhinorrhea, sore throat CV: Denies CP, palpitations, SOB, orthopnea Pulm: Denies SOB, cough, wheezing GI: Denies abdominal pain, vomiting, diarrhea, constipation  +nausea GU: Denies hematuria, vaginal discharge  +dysuria, frequency Msk: Denies muscle cramps, joint pains Neuro: Denies weakness, numbness, tingling Skin: Denies rashes, bruising Psych: Denies depression, anxiety, hallucinations     Objective:    Blood pressure 132/76, pulse 99, temperature 98.3 F (36.8 C), temperature source Oral, height 5' 6.5" (1.689 m), weight 221 lb (100.2 kg), SpO2 95 %.   Gen. Pleasant, well-nourished, in no distress, normal affect   HEENT: [place]/AT, face symmetric, no scleral icterus, PERRLA, nares patent without drainage Lungs: no accessory muscle use, CTAB, no wheezes or rales Cardiovascular: RRR, no peripheral edema Abdomen: BS present, soft, NT/ND Neuro:  A&Ox3, CN  II-XII intact, ambulating with a cane.   Wt Readings from Last 3 Encounters:  07/05/17 221 lb (100.2 kg)  05/04/17 232 lb (105.2 kg)  04/23/17 232 lb (105.2 kg)    Lab Results  Component Value Date   WBC 9.7 05/05/2017   HGB 9.9 (L) 05/05/2017   HCT 30.3 (L) 05/05/2017   PLT 250 05/05/2017   GLUCOSE 121 (H) 05/05/2017   CHOL 236 (H) 11/05/2016   TRIG 92.0 11/05/2016   HDL 73.30 11/05/2016   LDLDIRECT 187.9 10/08/2009   LDLCALC 144 (H) 11/05/2016   ALT 15 11/05/2016   AST 15 11/05/2016   NA 139  05/05/2017   K 4.4 05/05/2017   CL 105 05/05/2017   CREATININE 0.57 05/05/2017   BUN 7 05/05/2017   CO2 26 05/05/2017   TSH 2.12 11/05/2016   INR 1.0 07/12/2007   HGBA1C 5.3 11/05/2016    Assessment/Plan:  Acute cystitis without hematuria  -reviewed previous UCx results as ESBL E.coli in the past. -given handout - Plan: Urine Culture, nitrofurantoin, macrocrystal-monohydrate, (MACROBID) 100 MG capsule, phenazopyridine (PYRIDIUM) 200 MG tablet -Given RTC or ED precautions for worsening symptoms  Urinary frequency  - Plan: POCT urinalysis dipstick  F/u prn  Grier Mitts, MD

## 2017-07-05 NOTE — Patient Instructions (Signed)

## 2017-07-07 LAB — URINE CULTURE
MICRO NUMBER:: 90610542
SPECIMEN QUALITY: ADEQUATE

## 2017-07-09 DIAGNOSIS — Z96642 Presence of left artificial hip joint: Secondary | ICD-10-CM | POA: Diagnosis not present

## 2017-07-09 DIAGNOSIS — Z471 Aftercare following joint replacement surgery: Secondary | ICD-10-CM | POA: Diagnosis not present

## 2017-07-19 ENCOUNTER — Encounter: Payer: Self-pay | Admitting: Physical Therapy

## 2017-07-19 ENCOUNTER — Other Ambulatory Visit: Payer: Self-pay

## 2017-07-19 ENCOUNTER — Telehealth: Payer: Self-pay | Admitting: Adult Health

## 2017-07-19 ENCOUNTER — Ambulatory Visit: Payer: Medicare Other | Attending: Orthopedic Surgery | Admitting: Physical Therapy

## 2017-07-19 DIAGNOSIS — Z96642 Presence of left artificial hip joint: Secondary | ICD-10-CM | POA: Insufficient documentation

## 2017-07-19 DIAGNOSIS — R2689 Other abnormalities of gait and mobility: Secondary | ICD-10-CM | POA: Insufficient documentation

## 2017-07-19 DIAGNOSIS — M6281 Muscle weakness (generalized): Secondary | ICD-10-CM | POA: Insufficient documentation

## 2017-07-19 DIAGNOSIS — Z471 Aftercare following joint replacement surgery: Secondary | ICD-10-CM | POA: Diagnosis not present

## 2017-07-19 DIAGNOSIS — M25552 Pain in left hip: Secondary | ICD-10-CM | POA: Insufficient documentation

## 2017-07-19 NOTE — Patient Instructions (Signed)
Access Code: [email or web address]  URL: [email or web address]  Date: 07/19/2017  Prepared by: Earlie Counts   Exercises  Beginner Clam - 10 reps - 1 sets - 1x daily - 7x weekly  Hooklying Clamshell with Resistance - 10 reps - 3 sets - 1x daily - 7x weekly  Beginner Bridge - 10 reps - 2 sets - 1x daily - 7x weekly  Supine Heel Slide - 10 reps - 1 sets - 1x daily - 7x weekly  Supine Hip Adduction Isometric with Ball - 10 reps - 1 sets - 5 hold - 1x daily - 7x weekly  Seated Isometric Hip Adduction with Ball - 10 reps - 1 sets - 5 hold - 1x daily - 7x weekly  Supine Pelvic Tilt with Straight Leg Raise - 10 reps - 1 sets - 1x daily - 7x weekly  Walking in [employer] with [employer] - 10 reps - 3 sets - 1x daily - 7x weekly  March in [place] with [place] Under Knee - 10 reps - 3 sets - 1x daily - 7x weekly  Side Step Over in [place] - 10 reps - 3 sets - 1x daily - 7x weekly  Mini Squat with Counter Support - 10 reps - 1 sets - 1x daily - 7x weekly  Standing Repeated Hip Flexion with Ankle Weight - 10 reps - 1 sets - 1x daily - 7x weekly  Standing Hip Abduction - 10 reps - 2 sets - 1x daily - 7x weekly  Standing Hip Extension - 10 reps - 2 sets - 1x daily - 7x weekly  [facility] [address] [place], [place] [address] Phone # [phone] Fax [phone]

## 2017-07-19 NOTE — Telephone Encounter (Signed)
The patient came in today with the same symptoms of a UTI from 07/05/2017 when she saw Dr. Volanda Napoleon and wants to get the Rx refilled that Dr. Volanda Napoleon prescribed.   nitrofurantoin (macrocrystal-monohydrate)  phenazopyridine   She would like it sent to:  [facility], [place] [address] [phone] (Phone) [phone] (Fax)

## 2017-07-19 NOTE — Therapy (Signed)
[facility] [address], [place], [place], [address] Phone: [phone]   Fax:  [phone]  Physical Therapy Evaluation  Patient Details  Name: Tonya Bartlett MRN: [id number] Date of Birth: 06/06/1945 Referring Provider: Dr. Paralee Cancel   Encounter Date: 07/19/2017  PT End of Session - 07/19/17 1105    Visit Number  1    Date for PT Re-Evaluation  08/16/17    Authorization Type  Medicare BCBS supplement    PT Start Time  1015    PT Stop Time  1100    PT Time Calculation (min)  45 min    Activity Tolerance  Patient tolerated treatment well    Behavior During Therapy  [facility] for tasks assessed/performed       Past Medical History:  Diagnosis Date  . Allergic rhinitis   . Colitis   . Complication of anesthesia   . Depression   . Diverticulosis   . Frequency of urination   . GERD (gastroesophageal reflux disease)   . 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 History of hiatal hernia   . History of iron deficiency anemia   . History of migraine headaches   . Hyperlipidemia   . IBS (irritable bowel syndrome)   . Memory loss   . Migraine headache    hx of migraines   . OA (osteoarthritis)    RIGHT SHOULDER  . PONV (postoperative nausea and vomiting)   . Recovering alcoholic ([place])    SINCE [id number]  . Right rotator cuff tear   . Unspecified essential hypertension     Past Surgical History:  Procedure Laterality Date  . BUNIONECTOMY/  HAMMERTOE CORRECTION  RIGHT FOOT  2011  . CATARACT EXTRACTION W/ INTRAOCULAR LENS  IMPLANT, BILATERAL    . KNEE ARTHROSCOPY W/ MENISCECTOMY Bilateral X2  LEFT /    X1  RIGHT  . REPLACEMENT TOTAL KNEE Left 2006  . SHOULDER ARTHROSCOPY WITH SUBACROMIAL DECOMPRESSION, ROTATOR CUFF REPAIR AND BICEP TENDON REPAIR Right 05/23/2013   Procedure: RIGHT SHOULDER ARTHROSCOPY EXAM UNDER ANESTHESIA  WITH SUBACROMIAL DECOMPRESSION,DISTAL CLAVICLE RESECTION,  SADLABRAL DEBRIDEMENT CHONDROPLASTY, BICEP TENOTOMY ;  Surgeon: Sydnee Cabal, MD;  Location: [facility];  Service: Orthopedics;  Laterality: Right;  . TONSILLECTOMY AND ADENOIDECTOMY  AGE 72  . TOTAL HIP ARTHROPLASTY Left 05/04/2017   Procedure: LEFT TOTAL HIP ARTHROPLASTY ANTERIOR APPROACH;  Surgeon: Paralee Cancel, MD;  Location: [facility];  Service: Orthopedics;  Laterality: Left;  . TOTAL KNEE ARTHROPLASTY Bilateral LEFT  1996/   RIGHT 2004  . 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 VAGINAL HYSTERECTOMY  1976    There were no vitals filed for this visit.   Subjective Assessment - 07/19/17 1018    Subjective  I fell last weeks due to my legs feeling weak.  The replacement came out. When I take a step it rubs the bone.  Bone is growing aorund the prosthesis.  I want exercises to increase strength.  Next surgery is 08/26/2017. I had my hip replacement on 05/04/2017.  I have to walk with a cane.     Patient Stated Goals  exercises to do on her own    Currently in Pain?  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 Paralee Cancel    Onset Date/Surgical Date  05/04/17    Prior Therapy  None after surgery      Precautions   Precautions  Anterior Hip      Restrictions   Weight Bearing Restrictions  No      Balance Screen   Has the patient fallen in the past 6 months  Yes    How many times?  1 slipped on water in the kitchen, 07/17/2017, no  cane with her    Has the patient had a decrease in activity level because of a fear of falling?   No    Is the patient reluctant to leave their home because of a fear of falling?   No      Home Environment   Living Environment  Private residence    Living Arrangements  Alone    Type of [place] Access  Stairs to enter    Entrance Stairs-Number of Steps  [place]  One level      Prior Function   Level of Independence  Independent      Cognition   Overall Cognitive Status  Within Functional Limits for tasks assessed      Posture/Postural Control   Posture/Postural Control  No significant limitations      ROM / Strength   AROM / PROM / Strength  AROM;PROM;Strength      Strength   Left Hip Flexion  3-/5 painful    Left Hip Extension  2+/5    Left Hip ABduction  3-/5    Left Knee Extension  4/5 pain      Palpation   Palpation comment  tenderness with anterior left hip,       Ambulation/Gait   Ambulation/Gait  Yes    Assistive device  Straight cane    Gait Pattern  Decreased stride length;Decreased weight shift to left    Stairs  Yes    Stair Management Technique  Step to pattern;With cane                Objective measurements completed on examination: See above findings.              PT Education - 07/19/17 1107    Education Details  Access Code: [email or web address]    Person(s) Educated  Patient    Methods  Explanation;Demonstration;Handout    Comprehension  Verbalized understanding;Returned demonstration          PT Long Term Goals - 07/19/17 1125      PT LONG TERM GOAL #1   Title  independent with HEP and understand how to progress herself    Time  4    Period  Weeks    Status  New    Target Date  08/16/17      PT LONG TERM GOAL #  2   Title  pain with left hip hip pain decreased >/= 25% due to increased strength of left hip >/= 3-4/5    Time  4    Period  Weeks    Status  New    Target Date  08/16/17      PT LONG TERM  GOAL #3   Title  ---      PT LONG TERM GOAL #4   Title  ---             Plan - 07/19/17 1107    Clinical Impression Statement  Patient is a 72 year old female s/p left anterior approach hip replacement on 05/04/2017.  Patient reports the shaft is loose and she may need a revision on 08/26/2017.  Patient reports bone is now growing where the replacement is.  Patient has intermittent pain in left hip from 1-5/10 with weightbear activities and flexing her hip against gravity.  Patient has to assist her left leg in and out of bed and car due to pain and decreased strength. Patient ambulates with a single point cane, decreased weightbear on the left and decreased step length.  Tenderness located in the anterior left hip. Patient strength of left hip is 2-3/5 with pain on movement.  Patient reports she wants exercises to work at home and in the pool to increase strength.  Patient fell on 07/17/2017 when she slipped on water and was not using her cane. Patient was able to get up off the floor by herself.  Patient will benefit from skilled therapy to reduce her pain while increaseing the left hip strength.     History and Personal Factors relevant to plan of care:  s/p left anterior approach left hip replacement 05/04/2017; bilateral TKR; Memory loss    Clinical Presentation  Evolving    Clinical Presentation due to:  has to use her hands to bring her left leg into car and bed    Rehab Potential  Excellent    Clinical Impairments Affecting Rehab Potential  s/p left anterior approach left hip replacement 05/04/2017; bilateral TKR; Memory loss    PT Frequency  1x / week    PT Duration  4 weeks    PT Treatment/Interventions  Therapeutic exercise;Therapeutic activities;Passive range of motion;Manual techniques;Neuromuscular re-education;Patient/family education    PT Next Visit Plan  progress HEP for left hip strength; soft tissue work to left hip; balance exercises    PT Home Exercise Plan  Access Code:  [email or web address]    Consulted and Agree with Plan of Care  Patient       Patient will benefit from skilled therapeutic intervention in order to improve the following deficits and impairments:  Abnormal gait, Increased fascial restricitons, Pain, Decreased mobility, Increased muscle spasms, Decreased endurance, Decreased activity tolerance, Decreased strength, Difficulty walking  Visit Diagnosis: Muscle weakness (generalized) - Plan: PT plan of care cert/re-cert  Pain in left hip - Plan: PT plan of care cert/re-cert  Other abnormalities of gait and mobility - Plan: PT plan of care cert/re-cert  Aftercare following left hip joint replacement surgery - Plan: PT plan of care cert/re-cert     Problem List Patient Active Problem List   Diagnosis Date Noted  . Obese 05/05/2017  . 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 MCI (mild cognitive impairment) 09/19/2015  . Memory deficit 09/19/2015  . Psoriasis of scalp 11/06/2014  . Left knee pain 11/06/2014  . Sepsis ([place]) 10/29/2014  . SIRS (systemic inflammatory response syndrome) (HCC)  10/29/2014  . Fever 10/29/2014  . Joint pain 10/29/2014  . Serum sickness 10/29/2014  . Arthralgia   . 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 Depression (emotion) 11/16/2013  . S/P arthroscopy of shoulder 05/23/2013  . ADD (attention deficit disorder) 11/17/2011  . Essential hypertension, benign 10/15/2009  . SEBORRHEIC KERATOSIS, INFLAMED 10/17/2008  . CARPAL TUNNEL SYNDROME, LEFT 06/22/2008  . Allergic rhinitis 08/30/2007  . Hyperlipidemia 11/11/2006  . Migraine 11/11/2006  . Osteoarthritis 11/11/2006    Earlie Counts, PT 07/19/17 11:28 AM   [facility] [address], [place], [place], [address] Phone: [phone]   Fax:  [phone]  Name: Tonya Bartlett MRN: [id number] Date of Birth: Dec 09, 1945

## 2017-07-19 NOTE — Telephone Encounter (Signed)
Spoke with pt advised to see a provider for this issue, pt is scheduled to see [facility] tomorrow at 11.15 am.

## 2017-07-20 ENCOUNTER — Ambulatory Visit (INDEPENDENT_AMBULATORY_CARE_PROVIDER_SITE_OTHER): Payer: Medicare Other | Admitting: Family Medicine

## 2017-07-20 ENCOUNTER — Ambulatory Visit: Payer: Medicare Other | Admitting: Adult Health

## 2017-07-20 ENCOUNTER — Encounter: Payer: Self-pay | Admitting: Family Medicine

## 2017-07-20 ENCOUNTER — Encounter: Payer: Medicare Other | Admitting: Physical Therapy

## 2017-07-20 VITALS — BP 130/82 | HR 85 | Temp 98.4°F | Ht 66.5 in | Wt 221.5 lb

## 2017-07-20 DIAGNOSIS — R300 Dysuria: Secondary | ICD-10-CM

## 2017-07-20 DIAGNOSIS — N390 Urinary tract infection, site not specified: Secondary | ICD-10-CM | POA: Diagnosis not present

## 2017-07-20 LAB — POCT URINALYSIS DIPSTICK
Bilirubin, UA: NEGATIVE
Blood, UA: NEGATIVE
Glucose, UA: NEGATIVE
Ketones, UA: NEGATIVE
Leukocytes, UA: NEGATIVE
Nitrite, UA: NEGATIVE
Protein, UA: POSITIVE — AB
Spec Grav, UA: 1.02
Urobilinogen, UA: 0.2 U/dL
pH, UA: 6

## 2017-07-20 MED ORDER — SULFAMETHOXAZOLE-TRIMETHOPRIM 800-160 MG PO TABS: 1.0000 | ORAL_TABLET | Freq: Two times a day (BID) | ORAL | 0 refills | Status: DC

## 2017-07-20 NOTE — Progress Notes (Signed)
   Subjective:    Patient ID: Tonya Bartlett, female    DOB: [birth year], 72 y.o.   MRN: [id number]  HPI Here for recurrent UTI symptoms. She was seen here a few weeks ago for a UTI and the culture grew a pansensitive E coli. She took Nitrofurantoin for 7 days and felt better. Now for 3 days the burning on urination is back. No fever or nausea. She drinks plenty of water.    Review of Systems  Constitutional: Negative.   Respiratory: Negative.   Gastrointestinal: Negative.   Genitourinary: Positive for dysuria, frequency and urgency. Negative for flank pain and hematuria.       Objective:   Physical Exam  Constitutional: She appears well-developed and well-nourished.  Cardiovascular: Normal rate, regular rhythm, normal heart sounds and intact distal pulses.  Pulmonary/Chest: Effort normal and breath sounds normal.  Abdominal: Soft. Bowel sounds are normal. She exhibits no distension and no mass. There is no tenderness. There is no rebound and no guarding. No hernia.          Assessment & Plan:  Recurrent UTI. Treat with Bactrim DS. Refer to Urology.  Alysia Penna, MD

## 2017-07-20 NOTE — Telephone Encounter (Signed)
Noted  

## 2017-07-26 ENCOUNTER — Ambulatory Visit: Payer: Medicare Other | Admitting: Physical Therapy

## 2017-07-26 ENCOUNTER — Encounter: Payer: Self-pay | Admitting: Physical Therapy

## 2017-07-26 DIAGNOSIS — M25552 Pain in left hip: Secondary | ICD-10-CM

## 2017-07-26 DIAGNOSIS — R2689 Other abnormalities of gait and mobility: Secondary | ICD-10-CM | POA: Diagnosis not present

## 2017-07-26 DIAGNOSIS — Z471 Aftercare following joint replacement surgery: Secondary | ICD-10-CM

## 2017-07-26 DIAGNOSIS — Z96642 Presence of left artificial hip joint: Secondary | ICD-10-CM | POA: Diagnosis not present

## 2017-07-26 DIAGNOSIS — M6281 Muscle weakness (generalized): Secondary | ICD-10-CM | POA: Diagnosis not present

## 2017-07-26 NOTE — Patient Instructions (Signed)
Access Code: [email or web address]  URL: [email or web address]  Date: 07/26/2017  Prepared by: Earlie Counts   Exercises  Beginner Clam - 10 reps - 1 sets - 1x daily - 7x weekly  Hooklying Clamshell with Resistance - 10 reps - 3 sets - 1x daily - 7x weekly  Beginner Bridge - 10 reps - 2 sets - 1x daily - 7x weekly  Supine Heel Slide - 10 reps - 1 sets - 1x daily - 7x weekly  Supine Hip Adduction Isometric with Ball - 10 reps - 1 sets - 5 hold - 1x daily - 7x weekly  Seated Isometric Hip Adduction with Ball - 10 reps - 1 sets - 5 hold - 1x daily - 7x weekly  Supine Pelvic Tilt with Straight Leg Raise - 10 reps - 1 sets - 1x daily - 7x weekly  Walking in [employer] with [employer] - 10 reps - 3 sets - 1x daily - 7x weekly  March in [place] with [facility] Under Knee - 10 reps - 3 sets - 1x daily - 7x weekly  Side Step Over in [place] - 10 reps - 3 sets - 1x daily - 7x weekly  Mini Squat with Counter Support - 10 reps - 1 sets - 1x daily - 7x weekly  Standing Repeated Hip Flexion with Ankle Weight - 10 reps - 1 sets - 1x daily - 7x weekly  Standing Hip Abduction - 10 reps - 2 sets - 1x daily - 7x weekly  Standing Hip Extension - 10 reps - 2 sets - 1x daily - 7x weekly  Tandem Stance with Support - 4 reps - 1 sets - 30 hold - 1x daily - 7x weekly  [facility] [address] [place], [place] [address] Phone # [phone] Fax [phone]

## 2017-07-26 NOTE — Therapy (Signed)
[facility] [address], [place], [place], [address] Phone: [phone]   Fax:  [phone]  Physical Therapy Treatment  Patient Details  Name: Tonya Bartlett MRN: [id number] Date of Birth: [birth year] Referring Provider: Dr. Paralee Cancel   Encounter Date: 07/26/2017  PT End of Session - 07/26/17 1437    Visit Number  2    Date for PT Re-Evaluation  08/16/17    Authorization Type  Medicare BCBS supplement    PT Start Time  1400    PT Stop Time  1440    PT Time Calculation (min)  40 min    Activity Tolerance  Patient tolerated treatment well    Behavior During Therapy  [facility] for tasks assessed/performed       Past Medical History:  Diagnosis Date  . Allergic rhinitis   . Colitis   . Complication of anesthesia   . Depression   . Diverticulosis   . Frequency of urination   . GERD (gastroesophageal reflux disease)   . 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 History of hiatal hernia   . History of iron deficiency anemia   . History of migraine headaches   . Hyperlipidemia   . IBS (irritable bowel syndrome)   . Memory loss   . Migraine headache    hx of migraines   . OA (osteoarthritis)    RIGHT SHOULDER  . PONV (postoperative nausea and vomiting)   . Recovering alcoholic ([place])    SINCE [id number]  . Right rotator cuff tear   . Unspecified essential hypertension     Past Surgical History:  Procedure Laterality Date  . BUNIONECTOMY/  HAMMERTOE CORRECTION  RIGHT FOOT  2011  . CATARACT EXTRACTION W/ INTRAOCULAR LENS  IMPLANT, BILATERAL    . KNEE ARTHROSCOPY W/ MENISCECTOMY Bilateral X2  LEFT /    X1  RIGHT  . REPLACEMENT TOTAL KNEE Left 2006  . SHOULDER ARTHROSCOPY WITH SUBACROMIAL DECOMPRESSION, ROTATOR CUFF REPAIR AND BICEP TENDON REPAIR Right 05/23/2013   Procedure: RIGHT SHOULDER ARTHROSCOPY EXAM UNDER ANESTHESIA  WITH SUBACROMIAL DECOMPRESSION,DISTAL CLAVICLE RESECTION,  SADLABRAL DEBRIDEMENT CHONDROPLASTY, BICEP TENOTOMY ;  Surgeon: Sydnee Cabal, MD;  Location: [facility];  Service: Orthopedics;  Laterality: Right;  . TONSILLECTOMY AND ADENOIDECTOMY  AGE 72  . TOTAL HIP ARTHROPLASTY Left 05/04/2017   Procedure: LEFT TOTAL HIP ARTHROPLASTY ANTERIOR APPROACH;  Surgeon: Paralee Cancel, MD;  [facility];  Service: Orthopedics;  Laterality: Left;  . TOTAL KNEE ARTHROPLASTY Bilateral LEFT  1996/   RIGHT 2004  . 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 VAGINAL HYSTERECTOMY  1976    There were no vitals filed for this visit.  Subjective Assessment - 07/26/17 1404    Subjective  I had an end table fall on my left big toe. I use the cane for balance.      Patient Stated Goals  exercises to do on her own    Currently in Pain?  No/denies    Multiple Pain Sites  No                       [facility] Adult PT Treatment/Exercise - 07/26/17 0001      Exercises   Exercises  Knee/Hip      Knee/Hip Exercises: Stretches   Active Hamstring Stretch  Left;1 rep;30 seconds sitting    Gastroc Stretch  Left;1 rep;30  seconds sitting with strap      Knee/Hip Exercises: Standing   SLS  on left leg for 5 seconds but increased left hip pain.     Other Standing Knee Exercises  left hip abduction hold 5 sec 10x      Knee/Hip Exercises: Seated   Long Arc Quad  Strengthening;Left;3 sets;10 reps;Weights    Long Arc Quad Weight  3 lbs.    Other Seated Knee/Hip Exercises  hip adduction with red theraband  20 x    Hamstring Curl  Left;2 sets;10 reps grebanden ther      Knee/Hip Exercises: Supine   Bridges  Strengthening;Both;1 set;15 reps    Other Supine Knee/Hip Exercises  AAROM to left hip for flexion 2x10    Other Supine Knee/Hip Exercises  AAROM for left hip abduction 30x      Manual Therapy   Manual Therapy  Soft tissue mobilization    Soft tissue mobilization  left hip  muscles             PT Education - 07/26/17 1418    Education  Details  Access Code: [email or web address]     Person(s) Educated  Patient    Methods  Explanation;Demonstration;Verbal cues;Handout    Comprehension  Verbalized understanding;Returned demonstration          PT Long Term Goals - 07/19/17 1125      PT LONG TERM GOAL #1   Title  independent with HEP and understand how to progress herself    Time  4    Period  Weeks    Status  New    Target Date  08/16/17      PT LONG TERM GOAL #2   Title  pain with left hip hip pain decreased >/= 25% due to increased strength of left hip >/= 3-4/5    Time  4    Period  Weeks    Status  New    Target Date  08/16/17      PT LONG TERM GOAL #3   Title  ---      PT LONG TERM GOAL #4   Title  ---            Plan - 07/26/17 1407    Clinical Impression Statement  Patient had a table fall on her left big toe that is purple and swollen.  Patient is not able to do single leg stance on the left due to pain.  Patient has difficulty with her balance due to weakness in her left hip. Patient needs verbal cues to contract her abdominals with exercise.  Patient has not met goals due to just starting therapy.  Patient will benefit from skilled therapy to reduce her pain while increasing the left hip strength.     Rehab Potential  Excellent    Clinical Impairments Affecting Rehab Potential  s/p left anterior approach left hip replacement 05/04/2017; bilateral TKR; Memory loss    PT Frequency  1x / week    PT Duration  4 weeks    PT Treatment/Interventions  Therapeutic exercise;Therapeutic activities;Passive range of motion;Manual techniques;Neuromuscular re-education;Patient/family education    PT Next Visit Plan  progress HEP for left hip strength; soft tissue work to left hip; balance exercises    PT Home Exercise Plan  Access Code: [email or web address]    Recommended Other Services  Md signed the initial note    Consulted and Agree with Plan of Care  Patient       Patient will benefit  from skilled therapeutic intervention  in order to improve the following deficits and impairments:  Abnormal gait, Increased fascial restricitons, Pain, Decreased mobility, Increased muscle spasms, Decreased endurance, Decreased activity tolerance, Decreased strength, Difficulty walking  Visit Diagnosis: Muscle weakness (generalized)  Pain in left hip  Other abnormalities of gait and mobility  Aftercare following left hip joint replacement surgery     Problem List Patient Active Problem List   Diagnosis Date Noted  . Obese 05/05/2017  . 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 MCI (mild cognitive impairment) 09/19/2015  . Memory deficit 09/19/2015  . Psoriasis of scalp 11/06/2014  . Left knee pain 11/06/2014  . Sepsis ([place]) 10/29/2014  . SIRS (systemic inflammatory response syndrome) ([place]) 10/29/2014  . Fever 10/29/2014  . Joint pain 10/29/2014  . Serum sickness 10/29/2014  . Arthralgia   . 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 Depression (emotion) 11/16/2013  . S/P arthroscopy of shoulder 05/23/2013  . ADD (attention deficit disorder) 11/17/2011  . Essential hypertension, benign 10/15/2009  . SEBORRHEIC KERATOSIS, INFLAMED 10/17/2008  . CARPAL TUNNEL SYNDROME, LEFT 06/22/2008  . Allergic rhinitis 08/30/2007  . Hyperlipidemia 11/11/2006  . Migraine 11/11/2006  . Osteoarthritis 11/11/2006    Earlie Counts, PT 07/26/17 2:45 PM   [facility] [address] [place], [place], [address] Phone: [phone]   Fax:  [phone]  Name: Tonya Bartlett MRN: [id number] Date of Birth: Mar 30, 1945

## 2017-08-02 ENCOUNTER — Encounter: Payer: Self-pay | Admitting: Physical Therapy

## 2017-08-02 ENCOUNTER — Ambulatory Visit: Payer: Medicare Other | Admitting: Physical Therapy

## 2017-08-02 DIAGNOSIS — M6281 Muscle weakness (generalized): Secondary | ICD-10-CM

## 2017-08-02 DIAGNOSIS — Z471 Aftercare following joint replacement surgery: Secondary | ICD-10-CM | POA: Diagnosis not present

## 2017-08-02 DIAGNOSIS — R2689 Other abnormalities of gait and mobility: Secondary | ICD-10-CM

## 2017-08-02 DIAGNOSIS — M25552 Pain in left hip: Secondary | ICD-10-CM

## 2017-08-02 DIAGNOSIS — Z96642 Presence of left artificial hip joint: Secondary | ICD-10-CM

## 2017-08-02 NOTE — Patient Instructions (Signed)
Access Code: [email or web address]  URL: [email or web address]  Date: 08/02/2017  Prepared by: Earlie Counts   Exercises  Beginner Clam - 10 reps - 1 sets - 1x daily - 7x weekly  Hooklying Clamshell with Resistance - 10 reps - 3 sets - 1x daily - 7x weekly  Beginner Bridge - 10 reps - 2 sets - 1x daily - 7x weekly  Supine Heel Slide - 10 reps - 1 sets - 1x daily - 7x weekly  Supine Hip Adduction Isometric with Ball - 10 reps - 1 sets - 5 hold - 1x daily - 7x weekly  Seated Isometric Hip Adduction with Ball - 10 reps - 1 sets - 5 hold - 1x daily - 7x weekly  Supine Pelvic Tilt with Straight Leg Raise - 10 reps - 1 sets - 1x daily - 7x weekly  Walking in [employer] with [employer] - 10 reps - 3 sets - 1x daily - 7x weekly  March in [place] with [facility] Under Knee - 10 reps - 3 sets - 1x daily - 7x weekly  Side Step Over in [place] - 10 reps - 3 sets - 1x daily - 7x weekly  Mini Squat with Counter Support - 10 reps - 1 sets - 1x daily - 7x weekly  Standing Repeated Hip Flexion with Ankle Weight - 10 reps - 1 sets - 1x daily - 7x weekly  Standing Hip Abduction - 10 reps - 2 sets - 1x daily - 7x weekly  Standing Hip Extension - 10 reps - 2 sets - 1x daily - 7x weekly  Tandem Stance with Support - 4 reps - 1 sets - 30 hold - 1x daily - 7x weekly  Seated Hamstring Curls with Resistance - 10 reps - 2 sets - 1x daily - 7x weekly  Seated Knee Extension with Resistance - 10 reps - 2 sets - 1x daily - 7x weekly  [facility] [address] [place], [place] [address] Phone # [phone] Fax [phone]

## 2017-08-02 NOTE — Therapy (Signed)
[facility] [address], [place], [place], [address] Phone: [phone]   Fax:  [phone]  Physical Therapy Treatment  Patient Details  Name: Tonya Bartlett MRN: [id number] Date of Birth: [birth year] Referring Provider: Dr. Paralee Cancel   Encounter Date: 08/02/2017  PT End of Session - 08/02/17 1417    Visit Number  3    Date for PT Re-Evaluation  08/16/17    Authorization Type  Medicare BCBS supplement    PT Start Time  1400    PT Stop Time  1440    PT Time Calculation (min)  40 min    Activity Tolerance  Patient tolerated treatment well    Behavior During Therapy  [facility] for tasks assessed/performed       Past Medical History:  Diagnosis Date  . Allergic rhinitis   . Colitis   . Complication of anesthesia   . Depression   . Diverticulosis   . Frequency of urination   . GERD (gastroesophageal reflux disease)   . 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 History of hiatal hernia   . History of iron deficiency anemia   . History of migraine headaches   . Hyperlipidemia   . IBS (irritable bowel syndrome)   . Memory loss   . Migraine headache    hx of migraines   . OA (osteoarthritis)    RIGHT SHOULDER  . PONV (postoperative nausea and vomiting)   . Recovering alcoholic ([place])    SINCE [id number]  . Right rotator cuff tear   . Unspecified essential hypertension     Past Surgical History:  Procedure Laterality Date  . BUNIONECTOMY/  HAMMERTOE CORRECTION  RIGHT FOOT  2011  . CATARACT EXTRACTION W/ INTRAOCULAR LENS  IMPLANT, BILATERAL    . KNEE ARTHROSCOPY W/ MENISCECTOMY Bilateral X2  LEFT /    X1  RIGHT  . REPLACEMENT TOTAL KNEE Left 2006  . SHOULDER ARTHROSCOPY WITH SUBACROMIAL DECOMPRESSION, ROTATOR CUFF REPAIR AND BICEP TENDON REPAIR Right 05/23/2013   Procedure: RIGHT SHOULDER ARTHROSCOPY EXAM UNDER ANESTHESIA  WITH SUBACROMIAL DECOMPRESSION,DISTAL CLAVICLE RESECTION,  SADLABRAL DEBRIDEMENT CHONDROPLASTY, BICEP TENOTOMY ;  Surgeon: Sydnee Cabal, MD;  Location: [facility];  Service: Orthopedics;  Laterality: Right;  . TONSILLECTOMY AND ADENOIDECTOMY  AGE 72  . TOTAL HIP ARTHROPLASTY Left 05/04/2017   Procedure: LEFT TOTAL HIP ARTHROPLASTY ANTERIOR APPROACH;  Surgeon: Paralee Cancel, MD;  Location: [facility];  Service: Orthopedics;  Laterality: Left;  . TOTAL KNEE ARTHROPLASTY Bilateral LEFT  1996/   RIGHT 2004  . 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 VAGINAL HYSTERECTOMY  1976    There were no vitals filed for this visit.  Subjective Assessment - 08/02/17 1409    Subjective  I see the doctor on friday to see if I go for the surgery. I am very weak when I first stand up then after a few steps I am better.     Patient Stated Goals  exercises to do on her own    Currently in Pain?  Yes    Pain Score  4     Pain Location  Hip    Pain Orientation  Left    Pain Descriptors / Indicators  Aching;Dull    Pain Type  Surgical pain    Pain Onset  More than a month ago    Pain Frequency  Intermittent    Aggravating  Factors   getting in and out of bed and car    Pain Relieving Factors  rest    Multiple Pain Sites  No         [facility] PT Assessment - 08/02/17 0001      Assessment   Medical Diagnosis  Z47.1 Aftercare floolowing joint replacement surgery; J17.915 Presence of left artificial hip joint    Referring Provider  Dr. Paralee Cancel    Onset Date/Surgical Date  05/04/17    Prior Therapy  None after surgery      Precautions   Precautions  Anterior Hip      Restrictions   Weight Bearing Restrictions  No      [place] residence    Living Arrangements  Alone    Type of [place] to enter    Entrance Stairs-Number of Steps  [place]  One level      Prior Function   Level of Independence  Independent      Cognition   Overall Cognitive Status  Within Functional  Limits for tasks assessed      PROM   Overall PROM Comments  flexing the left hip pain at 100 degrees flexion, no pain with resistance through  the range.       Strength   Left Hip Flexion  4-/5 supine and pain    Left Hip Extension  4-/5    Left Hip ABduction  3-/5    Left Knee Extension  4+/5 no pain      Palpation   Palpation comment  tenderness with anterior left hip,       Ambulation/Gait   Ambulation/Gait  Yes    Assistive device  Straight cane    Gait Pattern  Decreased stride length;Decreased weight shift to left    Stairs  Yes    Stair Management Technique  Step to pattern;With cane                   [facility] Adult PT Treatment/Exercise - 08/02/17 0001      Knee/Hip Exercises: Standing   Hip Abduction  Stengthening;Left;2 sets;10 reps;Knee straight needs tactile cues    Abduction Limitations  tried 2# but pain    Hip Extension  Stengthening;Left;2 sets;10 reps;Knee straight    Extension Limitations  tactile cues to contract gluteals    Functional Squat  1 set;10 reps buttocks over the chair ; hold 4 seconds    Other Standing Knee Exercises  tandem stance 30 seconds 2 times each with holding on but pain with left sge behind      Knee/Hip Exercises: Seated   Long Arc Quad  Strengthening;Left;10 reps;Weights;2 sets started to have pain in left hip    Long Arc Quad Weight  4 lbs. with ball squeeze    Ball Squeeze  hold 5 seconds 15 times    Clamshell with TheraBand  Green 15x    Hamstring Curl  Left;2 sets;10 reps grebanden ther             PT Education - 08/02/17 1445    Education Details  Access Code: [email or web address]    Person(s) Educated  Patient    Methods  Explanation;Demonstration;Verbal cues;Handout    Comprehension  Returned demonstration;Verbalized understanding          PT Long Term Goals - 08/02/17 1709      PT LONG TERM GOAL #  1   Title  independent with HEP and understand how to progress herself    Time  4    Period  Weeks    Status   Achieved      PT LONG TERM GOAL #2   Title  pain with left hip hip pain decreased >/= 25% due to increased strength of left hip >/= 3-4/5    Baseline  except with left hip flexion in sitting    Time  4    Period  Weeks    Status  Achieved            Plan - 08/02/17 1438    Clinical Impression Statement  Patient has increased strength of left hip.  Patient has pain with flexing her left hip actively in sitting past 90 degrees.  When in supine and PT passively flexes the left hip there pain but if she will resist the left hip flexion there is no pain.  Patient needs tactile cues to contract the left gluteals with hip abduction and extension.  Patient has increased pain in tandem stance when the left foot is behind.  Patient is being discharge and independent with her HEP.     Rehab Potential  Excellent    Clinical Impairments Affecting Rehab Potential  s/p left anterior approach left hip replacement 05/04/2017; bilateral TKR; Memory loss    PT Treatment/Interventions  Therapeutic exercise;Therapeutic activities;Passive range of motion;Manual techniques;Neuromuscular re-education;Patient/family education    PT Next Visit Plan  Discharge to HEP    PT Home Exercise Plan  Access Code: [email or web address]    [email or web address] and Agree with Plan of Care  Patient       Patient will benefit from skilled therapeutic intervention in order to improve the following deficits and impairments:  Abnormal gait, Increased fascial restricitons, Pain, Decreased mobility, Increased muscle spasms, Decreased endurance, Decreased activity tolerance, Decreased strength, Difficulty walking  Visit Diagnosis: Muscle weakness (generalized)  Pain in left hip  Other abnormalities of gait and mobility  Aftercare following left hip joint replacement surgery     Problem List Patient Active Problem List   Diagnosis Date Noted  . Obese 05/05/2017  . 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 MCI (mild cognitive impairment) 09/19/2015  .  Memory deficit 09/19/2015  . Psoriasis of scalp 11/06/2014  . Left knee pain 11/06/2014  . Sepsis ([place]) 10/29/2014  . SIRS (systemic inflammatory response syndrome) ([place]) 10/29/2014  . Fever 10/29/2014  . Joint pain 10/29/2014  . Serum sickness 10/29/2014  . Arthralgia   . 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 Depression (emotion) 11/16/2013  . S/P arthroscopy of shoulder 05/23/2013  . ADD (attention deficit disorder) 11/17/2011  . Essential hypertension, benign 10/15/2009  . SEBORRHEIC KERATOSIS, INFLAMED 10/17/2008  . CARPAL TUNNEL SYNDROME, LEFT 06/22/2008  . Allergic rhinitis 08/30/2007  . Hyperlipidemia 11/11/2006  . Migraine 11/11/2006  . Osteoarthritis 11/11/2006    Earlie Counts, PT 08/02/17 5:10 PM   [facility] [address] [place], [place], [address] Phone: [phone]   Fax:  [phone]  Name: SHAMBHAVI SALLEY MRN: [id number] Date of Birth: 01-Feb-1946  PHYSICAL THERAPY DISCHARGE SUMMARY  Visits from Start of Care: 3  Current functional level related to goals / functional outcomes: See above.    Remaining deficits: See above   Education / Equipment: HEP Plan: Patient agrees to discharge.  Patient goals were met. Patient is being discharged due to meeting the stated rehab goals.  Thank you for the referral. Earlie Counts, PT 08/02/17 5:11 PM   ?????

## 2017-08-03 ENCOUNTER — Ambulatory Visit: Payer: Self-pay | Admitting: Neurology

## 2017-08-06 DIAGNOSIS — Z471 Aftercare following joint replacement surgery: Secondary | ICD-10-CM | POA: Diagnosis not present

## 2017-08-06 DIAGNOSIS — M1612 Unilateral primary osteoarthritis, left hip: Secondary | ICD-10-CM | POA: Diagnosis not present

## 2017-08-06 DIAGNOSIS — Z96642 Presence of left artificial hip joint: Secondary | ICD-10-CM | POA: Diagnosis not present

## 2017-08-10 DIAGNOSIS — H26493 Other secondary cataract, bilateral: Secondary | ICD-10-CM | POA: Diagnosis not present

## 2017-08-10 DIAGNOSIS — H02834 Dermatochalasis of left upper eyelid: Secondary | ICD-10-CM | POA: Diagnosis not present

## 2017-08-10 DIAGNOSIS — H18413 Arcus senilis, bilateral: Secondary | ICD-10-CM | POA: Diagnosis not present

## 2017-08-10 DIAGNOSIS — H26492 Other secondary cataract, left eye: Secondary | ICD-10-CM | POA: Diagnosis not present

## 2017-08-10 DIAGNOSIS — I10 Essential (primary) hypertension: Secondary | ICD-10-CM | POA: Diagnosis not present

## 2017-08-16 DIAGNOSIS — H26492 Other secondary cataract, left eye: Secondary | ICD-10-CM | POA: Diagnosis not present

## 2017-08-16 DIAGNOSIS — H26491 Other secondary cataract, right eye: Secondary | ICD-10-CM | POA: Diagnosis not present

## 2017-08-16 DIAGNOSIS — H5212 Myopia, left eye: Secondary | ICD-10-CM | POA: Diagnosis not present

## 2017-08-24 ENCOUNTER — Other Ambulatory Visit: Payer: Self-pay | Admitting: Adult Health

## 2017-08-24 DIAGNOSIS — F339 Major depressive disorder, recurrent, unspecified: Secondary | ICD-10-CM

## 2017-08-25 NOTE — Telephone Encounter (Signed)
Sent to the pharmacy by e-scribe for 90 days.  Due for annual in Sept.

## 2017-08-26 ENCOUNTER — Inpatient Hospital Stay: Admit: 2017-08-26 | Payer: Medicare Other | Admitting: Orthopedic Surgery

## 2017-08-26 SURGERY — FEMORAL REVISION
Anesthesia: Spinal | Laterality: Left

## 2017-08-27 DIAGNOSIS — H26491 Other secondary cataract, right eye: Secondary | ICD-10-CM | POA: Diagnosis not present

## 2017-08-30 ENCOUNTER — Encounter: Payer: Self-pay | Admitting: Neurology

## 2017-08-30 ENCOUNTER — Ambulatory Visit (INDEPENDENT_AMBULATORY_CARE_PROVIDER_SITE_OTHER): Payer: Medicare Other | Admitting: Neurology

## 2017-08-30 VITALS — BP 165/85 | HR 66 | Ht 66.5 in | Wt 218.2 lb

## 2017-08-30 DIAGNOSIS — G3184 Mild cognitive impairment, so stated: Secondary | ICD-10-CM | POA: Diagnosis not present

## 2017-08-30 NOTE — Patient Instructions (Signed)
I had a long discussion with the patient regarding her mild memory loss and cognitive impairment which appears to be stable. Continue Cerefolin NAC daily as well as increase participation in cognitively challenging activities like solving crossword puzzles, word searches, sudoku and playing bridge. I advised her to seek referral to a psychiatrist from her primary care physician for her depression which appears to be suboptimally controlled. She will return for follow-up in a year or call earlier if necessary

## 2017-08-30 NOTE — Progress Notes (Signed)
[facility] [address] [place]. [place] [address] [phone]       OFFICE FOLLOW UP VISIT NOTE  Tonya. Tonya Bartlett Date of Birth:  08-28-1945 Medical Record Number:  [id number]   Referring MD:  [name], NP  Reason for Referral:  Memory loss HPI: Initial Consult 09/19/15 :  Tonya Bartlett is a 37 year Caucasian lady who for the last year and a half has been having mild memory and cognitive difficulties. Tonya Bartlett often misplaces objects but can remember later. Tonya Bartlett is also having word finding issues and trouble getting Tonya Bartlett words out. At times Tonya Bartlett has had trouble driving to places that Tonya Bartlett has driven down  To numerous times before and yet for a few seconds cannot remember where it done. Tonya Bartlett is also forgetting names of the patient's. 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 There is no history of strokes seizures significant head injury with loss of consciousness. There is no family history of Alzheimer's dementia. Patient has not had any brain imaging studies done on November done for reversible causes of cognitive impairment. Tonya Bartlett denies feeling depressed. Past medical history significant for irritable bowel syndrome and syncopal episodes. Update 12/09/2015 : Tonya Bartlett returns for follow-up after last visit 2 and half months ago. Tonya Bartlett continues from mild short-term memory difficulties but these appear to be stable and not getting worse. Patient did not fill the prescription for self: Tonya Bartlett forgot. Tonya Bartlett had MRI scan the brain done on 10/09/2015 which I personally reviewed and shows only mild changes of chronic microvascular ischemia without any structural lesions tumor infarcts. Lab work done on 8/ 3/17 showed normal vitamin B12, TSH and RPR. EEG done on 10/24/15 was normal. Patient was interested in participating in the [facility] early  dementia study but Tonya Bartlett does not have a reliable caregiver to help Tonya Bartlett participate. Tonya Bartlett states he is taking Celexa and Tonya Bartlett depression is well controlled. Tonya Bartlett has no new complaints today. Update 06/08/2016 : Tonya Bartlett returns for followup after last visit 6 months ago. Tonya Bartlett states Tonya Bartlett continues to have mi and cognitive difficulties which are however unchanged. Tonya Bartlett has trouble finding words occasionally and completing sentences. Tonya Bartlett has been taking Cerefolin nac and find it helps.  Tonya Bartlett does play solitaire but does not participate in any other cognitively challenging activities and states Tonya Bartlett job is quite taxing and stressful. Tonya Bartlett has no neurological complaints today.Marland Kitchen Update 08/30/2017 : Tonya Bartlett returns for follow-up after last visit more than a year ago. Tonya Bartlett continues to do well and states Tonya Bartlett has not had any worsening of Tonya Bartlett memory or cognitive difficulties. Tonya Bartlett continues to have short-term memory difficulties. 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 Tonya Bartlett also play solitaire a lot. Tonya Bartlett did undergo left hip replacement in March this year and had a postop complication with implant popping up and had to be nonweightbearing for nearly 10 weeks. Tonya Bartlett is recovered from that and is now doing a lot of swimming to help Tonya Bartlett. Tonya Bartlett feels Tonya Bartlett depression has gotten worse and despite being on Wellbutrin. Tonya Bartlett plans to discuss with primary physician referral to a psychiatrist. Denies any other neurological complaints. Tonya Bartlett remains on; Cerefolin NAC which is tolerating well. Mini-Mental status exam score today was 30/30. ROS:   14 system review of systems is positive for  restless  legs, activity change, light sensitivity, memory loss, joint pain and swelling, aching muscles, walking difficulty, agitation, depression  and all other systems negative  PMH:  Past Medical History:  Diagnosis Date  . Allergic rhinitis   . Colitis   . Complication of anesthesia   . Depression   .  Diverticulosis   . Frequency of urination   . GERD (gastroesophageal reflux disease)   . 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 History of hiatal hernia   . History of iron deficiency anemia   . History of migraine headaches   . Hyperlipidemia   . IBS (irritable bowel syndrome)   . Memory loss   . Migraine headache    hx of migraines   . OA (osteoarthritis)    RIGHT SHOULDER  . PONV (postoperative nausea and vomiting)   . Recovering alcoholic ([place])    SINCE [id number]  . Right rotator cuff tear   . Unspecified essential hypertension     Social History:  Social History   Socioeconomic History  . Marital status: Widowed    Spouse name: Not on file  . Number of children: Not on file  . Years of education: Not on file  . Highest education level: Not on file  Occupational History  . Not on file  Social Needs  . Financial resource strain: Not on file  . Food insecurity:    Worry: Not on file    Inability: Not on file  . Transportation needs:    Medical: Not on file    Non-medical: Not on file  Tobacco Use  . Smoking status: Former Smoker    Packs/day: 0.50    Years: 15.00    Pack years: 7.50    Types: Cigarettes    Last attempt to quit: 05/15/1985    Years since quitting: 32.3  . Smokeless tobacco: Never Used  Substance and Sexual Activity  . Alcohol use: No    Comment: RECOVERING ALCOHOLIC--  QUIT [id number]  . Drug use: No  . Sexual activity: Never  Lifestyle  . Physical activity:    Days per week: Not on file    Minutes per session: Not on file  . Stress: Not on file  Relationships  . Social connections:    Talks on phone: Not on file    Gets together: Not on file    Attends religious service: Not on file    Active member of club or organization: Not on file    Attends meetings of clubs or organizations: Not on file    Relationship status: Not on file  . Intimate partner violence:    Fear of current or ex partner: Not on  file    Emotionally abused: Not on file    Physically abused: Not on file    Forced sexual activity: Not on file  Other Topics Concern  . 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 atorvastatin (LIPITOR) 20 MG tablet Take 1 tablet (20 mg total) by mouth daily. 90 tablet 3  . betamethasone dipropionate 0.05 % lotion APPLY EXTERNALLY TO THE AFFECTED AREA TWICE DAILY 60 mL 0  . buPROPion (  WELLBUTRIN XL) 150 MG 24 hr tablet TAKE 1 TABLET(150 MG) BY MOUTH DAILY 90 tablet 0  . Carboxymethylcellul-Glycerin (LUBRICATING EYE DROPS OP) Place 1 drop into both eyes daily.    . cetirizine (ZYRTEC) 10 MG tablet Take 10 mg by mouth daily.    Marland Kitchen docusate sodium (COLACE) 100 MG capsule Take 1 capsule (100 mg total) by mouth 2 (two) times daily. 10 capsule 0  . hydrocortisone cream 1 % Apply 1 application topically daily as needed for itching.    Marland Kitchen L-Methylfolate-B12-B6-B2 (CEREFOLIN) 07-17-48-5 MG TABS Take one caplet by mouth daily. 90 each 3  . losartan (COZAAR) 50 MG tablet TAKE 1 TABLET(50 MG) BY MOUTH DAILY 90 tablet 2  . omeprazole (PRILOSEC) 20 MG capsule TAKE 1 CAPSULE(20 MG) BY MOUTH DAILY 90 capsule 1  . psyllium (REGULOID) 0.52 g capsule Take 0.52 g by mouth daily.    Marland Kitchen sulfamethoxazole-trimethoprim (BACTRIM DS,SEPTRA DS) 800-160 MG tablet Take 1 tablet by mouth 2 (two) times daily. 14 tablet 0  . SUMAtriptan (IMITREX) 100 MG tablet Take 1 tablet (100 mg total) by mouth every 2 (two) hours as needed for migraine. May repeat in 2 hours if headache persists or recurs. (Patient taking differently: Take 50 mg by mouth every 2 (two) hours as needed for migraine. May repeat in 2 hours if headache persists or recurs. ) 10 tablet 0  . traZODone (DESYREL) 50 MG tablet Take 50 mg  by mouth daily at bedtime 90 tablet 1  . buPROPion (WELLBUTRIN XL) 150 MG 24 hr tablet bupropion HCl XL 150 mg 24 hr tablet, extended release   150 mg by oral route.    Marland Kitchen HYDROcodone-acetaminophen (NORCO/VICODIN) 5-325 MG tablet TAKE 1 TABLET EVERY 6 HOURS (FOR 30 DAYS)  0   No current facility-administered medications on file prior to visit.     Allergies:   Allergies  Allergen Reactions  . Penicillins Anaphylaxis    Has patient had a PCN reaction causing immediate rash, facial/tongue/throat swelling, SOB or lightheadedness with hypotension: Yes Has patient had a PCN reaction causing severe rash involving mucus membranes or skin necrosis: Yes Has patient had a PCN reaction that required hospitalization: No Has patient had a PCN reaction occurring within the last 10 year No If all of the above answers are "NO", then may proceed with Cephalosporin use.   . Erythromycin Other (See Comments)    SEVERE STOMACH CRAMPS  . Morphine And Related Nausea And Vomiting  . Nsaids Other (See Comments)    SEVERE STOMACH CRAMPS, MOUTH SORES **Able to tolerate Tylenol  . Nickel Rash    Including snaps on hospital gowns     Physical Exam General: well developed, well nourished elderly Caucasian lady, seated, in no evident distress Head: head normocephalic and atraumatic.   Neck: supple with no carotid or supraclavicular bruits Cardiovascular: regular rate and rhythm, no murmurs Musculoskeletal: no deformity Skin:  no rash/petichiae Vascular:  Normal pulses all extremities  Neurologic Exam Mental Status: Awake and fully alert. Oriented to place and time. Recent and remote memory intact. Attention span, concentration and fund of knowledge appropriate. Mood and affect appropriate. Mini-Mental status exam scored 30/30 with no deficits. . Able to name 12 four legged animals. Clock drawing 4/4. Able to copy intersecting pentagons quite well. Cranial Nerves: Fundoscopic exam not done. Pupils equal,  briskly reactive to light. Extraocular movements full without nystagmus. Visual fields full to confrontation. Hearing intact. Facial sensation intact. Face, tongue, palate moves normally and symmetrically.  Motor: Normal bulk and tone. Normal strength in all tested extremity muscles. Sensory.: intact to touch , pinprick , position and vibratory sensation.  Coordination: Rapid alternating movements normal in all extremities. Finger-to-nose and heel-to-shin performed accurately bilaterally. Gait and Station: Arises from chair without difficulty. Stance is normal. Gait demonstrates normal stride length and balance . Able to heel, toe and tandem walk without difficulty.  Reflexes: 1+ and symmetric. Toes downgoing.       ASSESSMENT: 38 year Caucasian lady with mild memory and cognitive difficulties likely due to mild cognitive impairment.    PLAN:  I had a long discussion with the patient regarding Tonya Bartlett mild memory loss and cognitive impairment which appears to be stable. Continue Cerefolin NAC daily as well as increase participation in cognitively challenging activities like solving crossword puzzles, word searches, sudoku and playing bridge. Tonya Bartlett will return for follow-up in a year or call earlier if necessary .Marland Kitchen Greater than 50% time during this 25 minute  visit was spent on counseling and coordination of care about Tonya Bartlett memory loss and cognitive impairment.   Antony Contras, MD  [facility] [address] [place], [place] [address]  Phone [phone] Fax [phone] Note: This document was prepared with digital dictation and possible smart phrase technology. Any transcriptional errors that result from this process are unintentional.

## 2017-09-03 DIAGNOSIS — Z961 Presence of intraocular lens: Secondary | ICD-10-CM | POA: Diagnosis not present

## 2017-09-03 DIAGNOSIS — H26493 Other secondary cataract, bilateral: Secondary | ICD-10-CM | POA: Diagnosis not present

## 2017-09-03 DIAGNOSIS — H52223 Regular astigmatism, bilateral: Secondary | ICD-10-CM | POA: Diagnosis not present

## 2017-09-03 DIAGNOSIS — H5212 Myopia, left eye: Secondary | ICD-10-CM | POA: Diagnosis not present

## 2017-09-13 DIAGNOSIS — M150 Primary generalized (osteo)arthritis: Secondary | ICD-10-CM | POA: Diagnosis not present

## 2017-09-13 DIAGNOSIS — M5030 Other cervical disc degeneration, unspecified cervical region: Secondary | ICD-10-CM | POA: Diagnosis not present

## 2017-09-13 DIAGNOSIS — E669 Obesity, unspecified: Secondary | ICD-10-CM | POA: Diagnosis not present

## 2017-09-13 DIAGNOSIS — L4050 Arthropathic psoriasis, unspecified: Secondary | ICD-10-CM | POA: Diagnosis not present

## 2017-09-13 DIAGNOSIS — Z6834 Body mass index (BMI) 34.0-34.9, adult: Secondary | ICD-10-CM | POA: Diagnosis not present

## 2017-09-13 DIAGNOSIS — M25511 Pain in right shoulder: Secondary | ICD-10-CM | POA: Diagnosis not present

## 2017-09-13 DIAGNOSIS — M5136 Other intervertebral disc degeneration, lumbar region: Secondary | ICD-10-CM | POA: Diagnosis not present

## 2017-09-13 DIAGNOSIS — L401 Generalized pustular psoriasis: Secondary | ICD-10-CM | POA: Diagnosis not present

## 2017-09-13 DIAGNOSIS — G5622 Lesion of ulnar nerve, left upper limb: Secondary | ICD-10-CM | POA: Diagnosis not present

## 2017-09-14 ENCOUNTER — Other Ambulatory Visit: Payer: Self-pay | Admitting: Adult Health

## 2017-09-14 DIAGNOSIS — Z760 Encounter for issue of repeat prescription: Secondary | ICD-10-CM

## 2017-09-16 NOTE — Telephone Encounter (Signed)
Not sure why pt is using.  Please advise.

## 2017-09-28 DIAGNOSIS — N342 Other urethritis: Secondary | ICD-10-CM | POA: Diagnosis not present

## 2017-09-28 DIAGNOSIS — R300 Dysuria: Secondary | ICD-10-CM | POA: Diagnosis not present

## 2017-10-07 ENCOUNTER — Other Ambulatory Visit: Payer: Self-pay | Admitting: Adult Health

## 2017-10-07 DIAGNOSIS — B356 Tinea cruris: Secondary | ICD-10-CM

## 2017-10-19 ENCOUNTER — Ambulatory Visit (INDEPENDENT_AMBULATORY_CARE_PROVIDER_SITE_OTHER): Payer: Medicare Other | Admitting: Adult Health

## 2017-10-19 ENCOUNTER — Encounter: Payer: Self-pay | Admitting: Adult Health

## 2017-10-19 VITALS — BP 134/80 | Temp 98.6°F | Wt 218.0 lb

## 2017-10-19 DIAGNOSIS — F325 Major depressive disorder, single episode, in full remission: Secondary | ICD-10-CM | POA: Diagnosis not present

## 2017-10-19 DIAGNOSIS — Z760 Encounter for issue of repeat prescription: Secondary | ICD-10-CM | POA: Diagnosis not present

## 2017-10-19 MED ORDER — NYSTATIN 100000 UNIT/GM EX POWD: Freq: Two times a day (BID) | CUTANEOUS | 3 refills | Status: DC

## 2017-10-19 NOTE — Progress Notes (Signed)
Subjective:    Patient ID: Tonya Bartlett, female    DOB: 18-Mar-1945, 72 y.o.   MRN: [id number]  HPI 72 year old female who  has a past medical history of Allergic rhinitis, Colitis, Complication of anesthesia, Depression, Diverticulosis, Frequency of urination, GERD (gastroesophageal reflux disease), History of DVT of lower extremity, History of hiatal hernia, History of iron deficiency anemia, History of migraine headaches, Hyperlipidemia, IBS (irritable bowel syndrome), Memory loss, Migraine headache, OA (osteoarthritis), PONV (postoperative nausea and vomiting), Recovering alcoholic ([place]), Right rotator cuff tear, and Unspecified essential hypertension.  She presents to the office today for a chronic issue of depression, she feels as though her symptoms are getting worse. She is currently prescribed Wellbutrin 150 mg XR and Trazodone 50 mg. She denies SI. She does not want to increase medication at this time. She would like referral to psychiatry   Also needs refill of Nystatin    Review of Systems See HPI   Past Medical History:  Diagnosis Date  . Allergic rhinitis   . Colitis   . Complication of anesthesia   . Depression   . Diverticulosis   . Frequency of urination   . GERD (gastroesophageal reflux disease)   . 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 History of hiatal hernia   . History of iron deficiency anemia   . History of migraine headaches   . Hyperlipidemia   . IBS (irritable bowel syndrome)   . Memory loss   . Migraine headache    hx of migraines   . OA (osteoarthritis)    RIGHT SHOULDER  . PONV (postoperative nausea and vomiting)   . Recovering alcoholic ([place])    SINCE [id number]  . Right rotator cuff tear   . Unspecified essential hypertension     Social History   Socioeconomic History  . Marital status: Widowed    Spouse name: Not on file  . Number of children: Not on file  . Years of education: Not on file  . Highest  education level: Not on file  Occupational History  . Not on file  Social Needs  . Financial resource strain: Not on file  . Food insecurity:    Worry: Not on file    Inability: Not on file  . Transportation needs:    Medical: Not on file    Non-medical: Not on file  Tobacco Use  . Smoking status: Former Smoker    Packs/day: 0.50    Years: 15.00    Pack years: 7.50    Types: Cigarettes    Last attempt to quit: 05/15/1985    Years since quitting: 32.4  . Smokeless tobacco: Never Used  Substance and Sexual Activity  . Alcohol use: No    Comment: RECOVERING ALCOHOLIC--  QUIT [id number]  . Drug use: No  . Sexual activity: Never  Lifestyle  . Physical activity:    Days per week: Not on file    Minutes per session: Not on file  . Stress: Not on file  Relationships  . Social connections:    Talks on phone: Not on file    Gets together: Not on file    Attends religious service: Not on file    Active member of club or organization: Not on file    Attends meetings of clubs or organizations: Not on file    Relationship status: Not on file  . Intimate partner violence:    Fear  of current or ex partner: Not on file    Emotionally abused: Not on file    Physically abused: Not on file    Forced sexual activity: Not on file  Other Topics Concern  . 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 BUNIONECTOMY/  HAMMERTOE CORRECTION  RIGHT FOOT  2011  . CATARACT EXTRACTION W/ INTRAOCULAR LENS  IMPLANT, BILATERAL    . KNEE ARTHROSCOPY W/ MENISCECTOMY Bilateral X2  LEFT /    X1  RIGHT  . REPLACEMENT TOTAL KNEE Left 2006  . SHOULDER ARTHROSCOPY WITH SUBACROMIAL DECOMPRESSION, ROTATOR CUFF REPAIR AND BICEP TENDON REPAIR Right 05/23/2013   Procedure: RIGHT SHOULDER ARTHROSCOPY EXAM UNDER ANESTHESIA  WITH  SUBACROMIAL DECOMPRESSION,DISTAL CLAVICLE RESECTION, SADLABRAL DEBRIDEMENT CHONDROPLASTY, BICEP TENOTOMY ;  Surgeon: Sydnee Cabal, MD;  Location: [facility];  Service: Orthopedics;  Laterality: Right;  . TONSILLECTOMY AND ADENOIDECTOMY  AGE 12  . TOTAL HIP ARTHROPLASTY Left 05/04/2017   Procedure: LEFT TOTAL HIP ARTHROPLASTY ANTERIOR APPROACH;  Surgeon: Paralee Cancel, MD;  Location: [facility];  Service: Orthopedics;  Laterality: Left;  . TOTAL KNEE ARTHROPLASTY Bilateral LEFT  1996/   RIGHT 2004  . 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 VAGINAL HYSTERECTOMY  1976    Family History  Problem Relation Age of Onset  . Heart disease Father 83  . Hypertension Father   . Melanoma Mother   . Cancer Sister        skin CA, 2 sisters  . Esophageal cancer Brother   . Dementia Paternal Grandfather     Allergies  Allergen Reactions  . Penicillins Anaphylaxis    Has patient had a PCN reaction causing immediate rash, facial/tongue/throat swelling, SOB or lightheadedness with hypotension: Yes Has patient had a PCN reaction causing severe rash involving mucus membranes or skin necrosis: Yes Has patient had a PCN reaction that required hospitalization: No Has patient had a PCN reaction occurring within the last 10 year No If all of the above answers are "NO", then may proceed with Cephalosporin use.   . Erythromycin Other (See Comments)    SEVERE STOMACH CRAMPS  . Morphine And Related Nausea And Vomiting  . Nsaids Other (See Comments)    SEVERE STOMACH CRAMPS, MOUTH SORES **Able to tolerate Tylenol  . Tolmetin     SEVERE STOMACH CRAMPS  . Nickel Rash    Including snaps on hospital gowns     Current Outpatient Medications on File Prior to Visit  Medication Sig Dispense Refill  . atorvastatin (LIPITOR) 20 MG tablet Take 1 tablet (20 mg total) by mouth daily. 90 tablet 3  . betamethasone dipropionate 0.05 % lotion APPLY EXTERNALLY TO THE AFFECTED AREA TWICE DAILY 60 mL 2  .  buPROPion (WELLBUTRIN XL) 150 MG 24 hr tablet TAKE 1 TABLET(150 MG) BY MOUTH DAILY 90 tablet 0  . buPROPion (WELLBUTRIN XL) 150 MG 24 hr tablet bupropion HCl XL 150 mg 24 hr tablet, extended release   150 mg by oral route.    . cetirizine (ZYRTEC) 10 MG tablet Take 10 mg by mouth daily.    . hydrocortisone cream 1 % Apply 1 application topically daily as needed for itching.    Marland Kitchen L-Methylfolate-B12-B6-B2 (CEREFOLIN) 07-17-48-5 MG TABS  Take one caplet by mouth daily. 90 each 3  . losartan (COZAAR) 50 MG tablet TAKE 1 TABLET(50 MG) BY MOUTH DAILY 90 tablet 2  . omeprazole (PRILOSEC) 20 MG capsule TAKE 1 CAPSULE(20 MG) BY MOUTH DAILY 90 capsule 1  . psyllium (REGULOID) 0.52 g capsule Take 0.52 g by mouth daily.    . SUMAtriptan (IMITREX) 100 MG tablet Take 1 tablet (100 mg total) by mouth every 2 (two) hours as needed for migraine. May repeat in 2 hours if headache persists or recurs. (Patient taking differently: Take 50 mg by mouth every 2 (two) hours as needed for migraine. May repeat in 2 hours if headache persists or recurs. ) 10 tablet 0  . traZODone (DESYREL) 50 MG tablet Take 50 mg by mouth daily at bedtime 90 tablet 1   No current facility-administered medications on file prior to visit.     BP 134/80   Temp 98.6 F (37 C)   Wt 218 lb (98.9 kg)   BMI 34.66 kg/m       Objective:   Physical Exam  Constitutional: She is oriented to person, place, and time. She appears well-developed and well-nourished. No distress.  Cardiovascular: Normal rate, regular rhythm, normal heart sounds and intact distal pulses.  Pulmonary/Chest: Effort normal and breath sounds normal.  Neurological: She is alert and oriented to person, place, and time.  Skin: Skin is warm and dry. Capillary refill takes less than 2 seconds. She is not diaphoretic.  Psychiatric: She has a normal mood and affect. Her behavior is normal. Judgment and thought content normal.  Nursing note and vitals reviewed.     Assessment &  Plan:  1. Depression, major, single episode, complete remission ([place])  - Ambulatory referral to Psychiatry - Advised to go to the ER with any thoughts of SI  - Follow up for CPE at the end of the month   2. Medication refill  - nystatin (MYCOSTATIN/NYSTOP) powder; Apply topically 2 (two) times daily.  Dispense: 60 g; Refill: 3   [name], NP

## 2017-11-16 ENCOUNTER — Telehealth: Payer: Self-pay | Admitting: Adult Health

## 2017-11-16 NOTE — Telephone Encounter (Signed)
Copied from [place] [id number]. Topic: Referral - Status >> Nov 16, 2017 11:38 AM Percell Belt A wrote: Reason for CRM: pt called in and wanted to check on the status of her referral for phychology , she stated that cory referred her a couple of week ago and she had not heard anything [facility] number  [phone]

## 2017-11-19 NOTE — Telephone Encounter (Signed)
Patient is calling Tonya Bartlett back. Please return call.

## 2017-11-19 NOTE — Telephone Encounter (Signed)
I called the pt back spoke to her asked her when the psychology referral was placed because I did not see it in our system. I provided her with [facility] psychology phone number  To see if they had her schedule and ask her to call our office back if she has not been schedule so I can send a message to corey about entering the referral

## 2017-11-19 NOTE — Telephone Encounter (Signed)
Psych referral was placed 10/19/17.

## 2017-11-23 ENCOUNTER — Ambulatory Visit (INDEPENDENT_AMBULATORY_CARE_PROVIDER_SITE_OTHER): Payer: Medicare Other | Admitting: Adult Health

## 2017-11-23 ENCOUNTER — Encounter: Payer: Self-pay | Admitting: Adult Health

## 2017-11-23 VITALS — BP 120/70 | HR 80 | Temp 97.9°F | Ht 66.0 in | Wt 215.0 lb

## 2017-11-23 DIAGNOSIS — Z808 Family history of malignant neoplasm of other organs or systems: Secondary | ICD-10-CM

## 2017-11-23 DIAGNOSIS — D0372 Melanoma in situ of left lower limb, including hip: Secondary | ICD-10-CM

## 2017-11-23 DIAGNOSIS — D492 Neoplasm of unspecified behavior of bone, soft tissue, and skin: Secondary | ICD-10-CM

## 2017-11-23 NOTE — Progress Notes (Signed)
Subjective:    Patient ID: Tonya Bartlett, female    DOB: 12-30-1945, 72 y.o.   MRN: [id number]  HPI  72 year old female who  has a past medical history of Allergic rhinitis, Colitis, Complication of anesthesia, Depression, Diverticulosis, Frequency of urination, GERD (gastroesophageal reflux disease), History of DVT of lower extremity, History of hiatal hernia, History of iron deficiency anemia, History of migraine headaches, Hyperlipidemia, IBS (irritable bowel syndrome), Memory loss, Migraine headache, OA (osteoarthritis), PONV (postoperative nausea and vomiting), Recovering alcoholic ([place]), Right rotator cuff tear, and Unspecified essential hypertension.  She presents to the office today for concern of skin cancer. She reports that her mother died of skin cancer ( melanoma) and she has a concerning spot on the left lower leg.   Review of Systems  Constitutional: Negative.   Skin: Positive for color change.   Past Medical History:  Diagnosis Date  . Allergic rhinitis   . Colitis   . Complication of anesthesia   . Depression   . Diverticulosis   . Frequency of urination   . GERD (gastroesophageal reflux disease)   . 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 History of hiatal hernia   . History of iron deficiency anemia   . History of migraine headaches   . Hyperlipidemia   . IBS (irritable bowel syndrome)   . Memory loss   . Migraine headache    hx of migraines   . OA (osteoarthritis)    RIGHT SHOULDER  . PONV (postoperative nausea and vomiting)   . Recovering alcoholic (Tonya Bartlett)    SINCE [id number]  . Right rotator cuff tear   . Unspecified essential hypertension     Social History   Socioeconomic History  . Marital status: Widowed    Spouse name: Not on file  . Number of children: Not on file  . Years of education: Not on file  . Highest education level: Not on file  Occupational History  . Not on file  Social Needs  . Financial resource  strain: Not on file  . Food insecurity:    Worry: Not on file    Inability: Not on file  . Transportation needs:    Medical: Not on file    Non-medical: Not on file  Tobacco Use  . Smoking status: Former Smoker    Packs/day: 0.50    Years: 15.00    Pack years: 7.50    Types: Cigarettes    Last attempt to quit: 05/15/1985    Years since quitting: 32.5  . Smokeless tobacco: Never Used  Substance and Sexual Activity  . Alcohol use: No    Comment: RECOVERING ALCOHOLIC--  QUIT [id number]  . Drug use: No  . Sexual activity: Never  Lifestyle  . Physical activity:    Days per week: Not on file    Minutes per session: Not on file  . Stress: Not on file  Relationships  . Social connections:    Talks on phone: Not on file    Gets together: Not on file    Attends religious service: Not on file    Active member of club or organization: Not on file    Attends meetings of clubs or organizations: Not on file    Relationship status: Not on file  . Intimate partner violence:    Fear of current or ex partner: Not on file    Emotionally abused: Not on file  Physically abused: Not on file    Forced sexual activity: Not on file  Other Topics Concern  . 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 BUNIONECTOMY/  HAMMERTOE CORRECTION  RIGHT FOOT  2011  . CATARACT EXTRACTION W/ INTRAOCULAR LENS  IMPLANT, BILATERAL    . KNEE ARTHROSCOPY W/ MENISCECTOMY Bilateral X2  LEFT /    X1  RIGHT  . REPLACEMENT TOTAL KNEE Left 2006  . SHOULDER ARTHROSCOPY WITH SUBACROMIAL DECOMPRESSION, ROTATOR CUFF REPAIR AND BICEP TENDON REPAIR Right 05/23/2013   Procedure: RIGHT SHOULDER ARTHROSCOPY EXAM UNDER ANESTHESIA  WITH SUBACROMIAL DECOMPRESSION,DISTAL CLAVICLE RESECTION, SADLABRAL DEBRIDEMENT CHONDROPLASTY, BICEP TENOTOMY ;   Surgeon: Tonya Cabal, MD;  Location: [facility];  Service: Orthopedics;  Laterality: Right;  . TONSILLECTOMY AND ADENOIDECTOMY  AGE 59  . TOTAL HIP ARTHROPLASTY Left 05/04/2017   Procedure: LEFT TOTAL HIP ARTHROPLASTY ANTERIOR APPROACH;  Surgeon: Tonya Cancel, MD;  Location: [facility];  Service: Orthopedics;  Laterality: Left;  . TOTAL KNEE ARTHROPLASTY Bilateral LEFT  1996/   RIGHT 2004  . 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 VAGINAL HYSTERECTOMY  1976    Family History  Problem Relation Age of Onset  . Heart disease Father 84  . Hypertension Father   . Melanoma Mother   . Cancer Sister        skin CA, 2 sisters  . Esophageal cancer Brother   . Dementia Paternal Grandfather     Allergies  Allergen Reactions  . Penicillins Anaphylaxis    Has patient had a PCN reaction causing immediate rash, facial/tongue/throat swelling, SOB or lightheadedness with hypotension: Yes Has patient had a PCN reaction causing severe rash involving mucus membranes or skin necrosis: Yes Has patient had a PCN reaction that required hospitalization: No Has patient had a PCN reaction occurring within the last 10 year No If all of the above answers are "NO", then may proceed with Cephalosporin use.   . Erythromycin Other (See Comments)    SEVERE STOMACH CRAMPS  . Morphine And Related Nausea And Vomiting  . Nsaids Other (See Comments)    SEVERE STOMACH CRAMPS, MOUTH SORES **Able to tolerate Tylenol  . Tolmetin     SEVERE STOMACH CRAMPS  . Nickel Rash    Including snaps on hospital gowns     Current Outpatient Medications on File Prior to Visit  Medication Sig Dispense Refill  . atorvastatin (LIPITOR) 20 MG tablet Take 1 tablet (20 mg total) by mouth daily. 90 tablet 3  . betamethasone dipropionate 0.05 % lotion APPLY EXTERNALLY TO THE AFFECTED AREA TWICE DAILY 60 mL 2  . buPROPion (WELLBUTRIN XL) 150 MG 24 hr tablet TAKE 1 TABLET(150 MG) BY MOUTH DAILY 90 tablet 0  . buPROPion  (WELLBUTRIN XL) 150 MG 24 hr tablet bupropion HCl XL 150 mg 24 hr tablet, extended release   150 mg by oral route.    . cetirizine (ZYRTEC) 10 MG tablet Take 10 mg by mouth daily.    . hydrocortisone cream 1 % Apply 1 application topically daily as needed for itching.    Tonya Kitchen L-Methylfolate-B12-B6-B2 (CEREFOLIN) 07-17-48-5 MG TABS Take one caplet by mouth daily. 90 each 3  . losartan (COZAAR) 50 MG tablet TAKE 1 TABLET(50  MG) BY MOUTH DAILY 90 tablet 2  . nystatin (MYCOSTATIN/NYSTOP) powder Apply topically 2 (two) times daily. 60 g 3  . omeprazole (PRILOSEC) 20 MG capsule TAKE 1 CAPSULE(20 MG) BY MOUTH DAILY 90 capsule 1  . psyllium (REGULOID) 0.52 g capsule Take 0.52 g by mouth daily.    . SUMAtriptan (IMITREX) 100 MG tablet Take 1 tablet (100 mg total) by mouth every 2 (two) hours as needed for migraine. May repeat in 2 hours if headache persists or recurs. (Patient taking differently: Take 50 mg by mouth every 2 (two) hours as needed for migraine. May repeat in 2 hours if headache persists or recurs. ) 10 tablet 0  . traZODone (DESYREL) 50 MG tablet Take 50 mg by mouth daily at bedtime 90 tablet 1   No current facility-administered medications on file prior to visit.     BP 120/70 (BP Location: Left Arm, Patient Position: Sitting, Cuff Size: Normal)   Pulse 80   Temp 97.9 F (36.6 C) (Oral)   Ht 5\' 6"  (1.676 m)   Wt 215 lb (97.5 kg)   SpO2 97%   BMI 34.70 kg/m      Objective:   Physical Exam  Constitutional: She is oriented to person, place, and time. She appears well-developed and well-nourished. No distress.  Cardiovascular: Normal rate, regular rhythm, normal heart sounds and intact distal pulses.  Pulmonary/Chest: Effort normal and breath sounds normal.  Neurological: She is alert and oriented to person, place, and time.  Skin: Skin is warm and dry. She is not diaphoretic.  Area of concern is on left lower lateral leg. She has a small area with hyperpigmentation.   Nursing note  and vitals reviewed.     Assessment & Plan:  1. Family history of melanoma - Dermatology pathology  2. Abnormal skin growth Procedure including risks/benefits explained to patient.  Questions were answered. After informed consent was obtained and a time out completed, site was cleansed with betadine and then alcohol. 2% Lidocaine with epinephrine was injected under lesion and then shave biopsy was performed with 3 mm boarders. Area was cauterized to obtain hemostasis.  Pt tolerated procedure well.  Specimen sent for pathology review.  Pt instructed to keep the area dry for 24 hours and to contact us if he develops redness, drainage or swelling at the site.  Pt may use tylenol as needed for discomfort today.  - Dermatology pathology   [name], NP

## 2017-11-24 DIAGNOSIS — M25521 Pain in right elbow: Secondary | ICD-10-CM | POA: Diagnosis not present

## 2017-11-26 ENCOUNTER — Telehealth: Payer: Self-pay | Admitting: Adult Health

## 2017-11-26 ENCOUNTER — Other Ambulatory Visit: Payer: Self-pay | Admitting: Adult Health

## 2017-11-26 DIAGNOSIS — C4372 Malignant melanoma of left lower limb, including hip: Secondary | ICD-10-CM

## 2017-11-26 NOTE — Telephone Encounter (Signed)
Pt would like cory to call her back personally

## 2017-11-26 NOTE — Telephone Encounter (Signed)
Copied from [place] [id number] [id number]. Topic: Quick Communication - See Telephone Encounter >> Nov 26, 2017  8:35 AM Blase Mess A wrote: CRM for notification. See Telephone encounter for: 11/26/17. 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  [facility] [phone]

## 2017-11-26 NOTE — Telephone Encounter (Signed)
Biopsy positive for melanoma. Have placed urgent referral to [facility]

## 2017-11-26 NOTE — Telephone Encounter (Signed)
Pt notified of results and that a referral has been placed.  No further action required.

## 2017-12-02 ENCOUNTER — Ambulatory Visit (INDEPENDENT_AMBULATORY_CARE_PROVIDER_SITE_OTHER): Payer: Medicare Other | Admitting: *Deleted

## 2017-12-02 DIAGNOSIS — Z23 Encounter for immunization: Secondary | ICD-10-CM

## 2017-12-03 ENCOUNTER — Other Ambulatory Visit: Payer: Self-pay | Admitting: Adult Health

## 2017-12-07 ENCOUNTER — Other Ambulatory Visit: Payer: Self-pay | Admitting: Family Medicine

## 2017-12-07 NOTE — Telephone Encounter (Signed)
Sent to the pharmacy by e-scribe.  Pt has cpx scheduled for 12/29/17.

## 2017-12-15 DIAGNOSIS — D0372 Melanoma in situ of left lower limb, including hip: Secondary | ICD-10-CM | POA: Diagnosis not present

## 2017-12-15 DIAGNOSIS — L989 Disorder of the skin and subcutaneous tissue, unspecified: Secondary | ICD-10-CM | POA: Diagnosis not present

## 2017-12-15 HISTORY — PX: MOHS SURGERY: SUR867

## 2017-12-28 ENCOUNTER — Other Ambulatory Visit (HOSPITAL_COMMUNITY): Payer: Self-pay | Admitting: Psychiatry

## 2017-12-28 ENCOUNTER — Ambulatory Visit (INDEPENDENT_AMBULATORY_CARE_PROVIDER_SITE_OTHER): Payer: Medicare Other | Admitting: Psychiatry

## 2017-12-28 ENCOUNTER — Encounter (HOSPITAL_COMMUNITY): Payer: Self-pay | Admitting: Psychiatry

## 2017-12-28 VITALS — BP 124/82 | HR 73 | Ht 66.5 in | Wt 217.0 lb

## 2017-12-28 DIAGNOSIS — F331 Major depressive disorder, recurrent, moderate: Secondary | ICD-10-CM | POA: Diagnosis not present

## 2017-12-28 MED ORDER — ESCITALOPRAM OXALATE 10 MG PO TABS: 10.0000 mg | ORAL_TABLET | Freq: Every day | ORAL | 1 refills | Status: DC

## 2017-12-28 NOTE — Progress Notes (Signed)
Psychiatric Initial Adult Assessment   Patient Identification: Tonya Bartlett MRN:  [id number] Date of Evaluation:  12/28/2017 Referral Source: NP Chief Complaint:   Chief Complaint    Establish Care; Depression     Visit Diagnosis:    ICD-10-CM   1. Major depressive disorder, recurrent episode, moderate (HCC) F33.1     History of Present Illness: 72 years old currently widowed white female lives by herself has 3 grown kids and 6 grandkids referred by family medicine for management of depression  Patient lost her husband in 2000 and since then she has felt that it is somewhat difficult and also has episodes of depression. She has been active person in substance abuse has led the program at [facility] for 8 9 years  She has been sober off 37 years from alcohol not being admitted in the hospital for depression or suicidal attempt but she was in the hospital for alcohol use and depression around 1981  She has been on different medication for depression including Wellbutrin and Celexa Zoloft Prozac Prozac made her more worse she did benefit from medication for a while but then it would not be effective since Lexapro has been helpful in the past last use was 8 years ago  Recently Wellbutrin was started it did not help her much she has never been on 2 different antidepressants but she felt that she has always been helped by SSRIs  No history of mania psychotic symptoms no history of panic disorder  Modifying factors her kids her grandkids but they live not with her Aggravating factors.  Hip replacement surgery.  Medical issues.  Some forgetfulness recently  She also has gained weight because she had a hip replacement surgery and she had to rest for 10 weeks for the bone to grow that had made her more feeling of lethargy down and depression also recently diagnosed with melanoma that had subsided her    Associated Signs/Symptoms: Depression Symptoms:  depressed mood, difficulty  concentrating, anxiety, (Hypo) Manic Symptoms:  Distractibility, Anxiety Symptoms:  Excessive Worry, Psychotic Symptoms:  denies PTSD Symptoms: NA  Past Psychiatric History: depression  Previous Psychotropic Medications: Yes   Substance Abuse History in the last 12 months:  No.  Consequences of Substance Abuse: NA  Past Medical History:  Past Medical History:  Diagnosis Date  . Allergic rhinitis   . Colitis   . Complication of anesthesia   . Depression   . Diverticulosis   . Frequency of urination   . GERD (gastroesophageal reflux disease)   . 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 History of hiatal hernia   . History of iron deficiency anemia   . History of migraine headaches   . Hyperlipidemia   . IBS (irritable bowel syndrome)   . Memory loss   . Migraine headache    hx of migraines   . OA (osteoarthritis)    RIGHT SHOULDER  . PONV (postoperative nausea and vomiting)   . Recovering alcoholic ([place])    SINCE [id number]  . Right rotator cuff tear   . Unspecified essential hypertension     Past Surgical History:  Procedure Laterality Date  . BUNIONECTOMY/  HAMMERTOE CORRECTION  RIGHT FOOT  2011  . CATARACT EXTRACTION W/ INTRAOCULAR LENS  IMPLANT, BILATERAL    . KNEE ARTHROSCOPY W/ MENISCECTOMY Bilateral X2  LEFT /    X1  RIGHT  . REPLACEMENT TOTAL KNEE Left 2006  . SHOULDER  ARTHROSCOPY WITH SUBACROMIAL DECOMPRESSION, ROTATOR CUFF REPAIR AND BICEP TENDON REPAIR Right 05/23/2013   Procedure: RIGHT SHOULDER ARTHROSCOPY EXAM UNDER ANESTHESIA  WITH SUBACROMIAL DECOMPRESSION,DISTAL CLAVICLE RESECTION, SADLABRAL DEBRIDEMENT CHONDROPLASTY, BICEP TENOTOMY ;  Surgeon: Sydnee Cabal, MD;  Location: [facility];  Service: Orthopedics;  Laterality: Right;  . TONSILLECTOMY AND ADENOIDECTOMY  AGE 16  . TOTAL HIP ARTHROPLASTY Left 05/04/2017   Procedure: LEFT TOTAL HIP ARTHROPLASTY ANTERIOR APPROACH;  Surgeon: Paralee Cancel, MD;   Location: [facility];  Service: Orthopedics;  Laterality: Left;  . TOTAL KNEE ARTHROPLASTY Bilateral LEFT  1996/   RIGHT 2004  . 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 VAGINAL HYSTERECTOMY  1976    Family Psychiatric History: mom possible depression  Family History:  Family History  Problem Relation Age of Onset  . Heart disease Father 29  . Hypertension Father   . Melanoma Mother   . Cancer Sister        skin CA, 2 sisters  . Esophageal cancer Brother   . Dementia Paternal Grandfather     Social History:   Social History   Socioeconomic History  . Marital status: Widowed    Spouse name: Not on file  . Number of children: Not on file  . Years of education: Not on file  . Highest education level: Not on file  Occupational History  . Not on file  Social Needs  . Financial resource strain: Not on file  . Food insecurity:    Worry: Not on file    Inability: Not on file  . Transportation needs:    Medical: Not on file    Non-medical: Not on file  Tobacco Use  . Smoking status: Former Smoker    Packs/day: 0.50    Years: 15.00    Pack years: 7.50    Types: Cigarettes    Last attempt to quit: 05/15/1985    Years since quitting: 32.6  . Smokeless tobacco: Never Used  Substance and Sexual Activity  . Alcohol use: No    Comment: RECOVERING ALCOHOLIC--  QUIT [id number]  . Drug use: No  . Sexual activity: Never  Lifestyle  . Physical activity:    Days per week: Not on file    Minutes per session: Not on file  . Stress: Not on file  Relationships  . Social connections:    Talks on phone: Not on file    Gets together: Not on file    Attends religious service: Not on file    Active member of club or organization: Not on file    Attends meetings of clubs or organizations: Not on file    Relationship status: Not on file  Other Topics Concern  . 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  Patient is a widow since 37.  She is retired and she still active and helping people in [place]:   Allergies  Allergen Reactions  . Penicillins Anaphylaxis    Has patient had a PCN reaction causing immediate rash, facial/tongue/throat swelling, SOB or lightheadedness with hypotension: Yes Has patient had a PCN reaction causing severe rash involving mucus  membranes or skin necrosis: Yes Has patient had a PCN reaction that required hospitalization: No Has patient had a PCN reaction occurring within the last 10 year No If all of the above answers are "NO", then may proceed with Cephalosporin use.   . Erythromycin Other (See Comments)    SEVERE STOMACH CRAMPS  . Morphine And Related Nausea And Vomiting  . Nsaids Other (See Comments)    SEVERE STOMACH CRAMPS, MOUTH SORES **Able to tolerate Tylenol  . Tolmetin     SEVERE STOMACH CRAMPS  . Nickel Rash    Including snaps on hospital gowns     Metabolic Disorder Labs: Lab Results  Component Value Date   HGBA1C 5.3 11/05/2016   No results found for: PROLACTIN Lab Results  Component Value Date   CHOL 236 (H) 11/05/2016   TRIG 92.0 11/05/2016   HDL 73.30 11/05/2016   CHOLHDL 3 11/05/2016   VLDL 18.4 11/05/2016   LDLCALC 144 (H) 11/05/2016   LDLCALC 168 (H) 10/07/2015     Current Medications: Current Outpatient Medications  Medication Sig Dispense Refill  . betamethasone dipropionate 0.05 % lotion APPLY EXTERNALLY TO THE AFFECTED AREA TWICE DAILY 60 mL 2  . cetirizine (ZYRTEC) 10 MG tablet Take 10 mg by mouth daily.    . hydrocortisone cream 1 % Apply 1 application topically daily as needed for itching.    Marland Kitchen L-Methylfolate-B12-B6-B2 (CEREFOLIN) 07-17-48-5 MG TABS Take one caplet by mouth daily. 90 each 3  . losartan (COZAAR) 50 MG  tablet TAKE 1 TABLET(50 MG) BY MOUTH DAILY 90 tablet 2  . nystatin (MYCOSTATIN/NYSTOP) powder Apply topically 2 (two) times daily. 60 g 3  . omeprazole (PRILOSEC) 20 MG capsule TAKE 1 CAPSULE(20 MG) BY MOUTH DAILY 90 capsule 1  . psyllium (REGULOID) 0.52 g capsule Take 0.52 g by mouth daily.    . SUMAtriptan (IMITREX) 100 MG tablet Take 1 tablet (100 mg total) by mouth every 2 (two) hours as needed for migraine. May repeat in 2 hours if headache persists or recurs. (Patient taking differently: Take 50 mg by mouth every 2 (two) hours as needed for migraine. May repeat in 2 hours if headache persists or recurs. ) 10 tablet 0  . traZODone (DESYREL) 50 MG tablet TAKE 1 TABLET BY MOUTH DAILY AT BEDTIME 90 tablet 0  . atorvastatin (LIPITOR) 20 MG tablet Take 1 tablet (20 mg total) by mouth daily. (Patient not taking: Reported on 12/28/2017) 90 tablet 3  . escitalopram (LEXAPRO) 10 MG tablet Take 1 tablet (10 mg total) by mouth daily. 30 tablet 1   No current facility-administered medications for this visit.     Neurologic: Headache: No Seizure: No Paresthesias:No  Musculoskeletal: Strength & Muscle Tone: within normal limits Gait & Station: somewhat unsteady due to hip replacment in the past Patient leans: none  Psychiatric Specialty Exam: Review of Systems  Cardiovascular: Negative for chest pain.  Skin: Negative for rash.  Neurological: Negative for tremors.  Psychiatric/Behavioral: Negative for suicidal ideas.    Blood pressure 124/82, pulse 73, height 5' 6.5" (1.689 m), weight 217 lb (98.4 kg), SpO2 92 %.Body mass index is 34.5 kg/m.  General Appearance: Casual  Eye Contact:  Fair  Speech:  Normal Rate  Volume:  Decreased  Mood:  subdued  Affect:  Congruent  Thought Process:  Goal Directed  Orientation:  Full (Time, Place, and Person)  Thought Content:  Rumination  Suicidal Thoughts:  No  Homicidal Thoughts:  No  Memory:  Immediate;  Fair Recent;   Fair  Judgement:  Fair   Insight:  Fair  Psychomotor Activity:  Normal  Concentration:  Concentration: Fair and Attention Span: Fair  Recall:  [employer] of Knowledge:Good  Language: Good  Akathisia:  No  Handed:  Right  AIMS (if indicated):    Assets:  Desire for Improvement  ADL's:  Intact  Cognition: Impaired,  Mild  Sleep:  Variable to fair on meds    Treatment Plan Summary: Medication management and Plan as follows  1. MDD, mild to moderate: start lexapro 5mg  increase to 10mg  in one week. Reviewed side effects Possible grief and recent medical issues has kept her feeling subdued   Will augment or increase next visit. Has not been on 2 different medications for depression before Also discussed to add activities, walking and keeping balance in responsibilities  She is arranging a beach trip with family next year Alcohol recovery is in full remission for more then 30 years  More than 50% time spent in counseling and coordination of care including patient education review of service and concerns were addressed   [name], MD 11/12/20192:37 PM

## 2017-12-29 ENCOUNTER — Ambulatory Visit (INDEPENDENT_AMBULATORY_CARE_PROVIDER_SITE_OTHER): Payer: Medicare Other | Admitting: Adult Health

## 2017-12-29 ENCOUNTER — Encounter: Payer: Self-pay | Admitting: Adult Health

## 2017-12-29 VITALS — BP 140/82 | Temp 97.4°F | Ht 65.5 in | Wt 217.0 lb

## 2017-12-29 DIAGNOSIS — I10 Essential (primary) hypertension: Secondary | ICD-10-CM

## 2017-12-29 DIAGNOSIS — Z780 Asymptomatic menopausal state: Secondary | ICD-10-CM

## 2017-12-29 DIAGNOSIS — Z1211 Encounter for screening for malignant neoplasm of colon: Secondary | ICD-10-CM | POA: Diagnosis not present

## 2017-12-29 DIAGNOSIS — E782 Mixed hyperlipidemia: Secondary | ICD-10-CM

## 2017-12-29 DIAGNOSIS — G43911 Migraine, unspecified, intractable, with status migrainosus: Secondary | ICD-10-CM | POA: Diagnosis not present

## 2017-12-29 DIAGNOSIS — F3341 Major depressive disorder, recurrent, in partial remission: Secondary | ICD-10-CM | POA: Diagnosis not present

## 2017-12-29 LAB — LIPID PANEL
Cholesterol: 170 mg/dL (ref 0–200)
HDL: 59.6 mg/dL (ref >39.00)
LDL Cholesterol: 92 mg/dL (ref 0–99)
NonHDL: 110.29
Total CHOL/HDL Ratio: 3
Triglycerides: 89 mg/dL (ref 0.0–149.0)
VLDL: 17.8 mg/dL (ref 0.0–40.0)

## 2017-12-29 LAB — BASIC METABOLIC PANEL
BUN: 14 mg/dL (ref 6–23)
CO2: 28 mEq/L (ref 19–32)
Calcium: 9.6 mg/dL (ref 8.4–10.5)
Chloride: 105 mEq/L (ref 96–112)
Creatinine, Ser: 0.68 mg/dL (ref 0.40–1.20)
GFR: 90.07 mL/min (ref >60.00)
Glucose, Bld: 101 mg/dL — ABNORMAL HIGH (ref 70–99)
POTASSIUM: 4.9 meq/L (ref 3.5–5.1)
Sodium: 140 mEq/L (ref 135–145)

## 2017-12-29 LAB — CBC WITH DIFFERENTIAL/PLATELET
Basophils Absolute: 0.1 10*3/uL (ref 0.0–0.1)
Basophils Relative: 1.4 % (ref 0.0–3.0)
EOS ABS: 0.1 10*3/uL (ref 0.0–0.7)
EOS PCT: 1.6 % (ref 0.0–5.0)
HCT: 38.2 % (ref 36.0–46.0)
Hemoglobin: 12.9 g/dL (ref 12.0–15.0)
LYMPHS ABS: 1.2 10*3/uL (ref 0.7–4.0)
Lymphocytes Relative: 21.6 % (ref 12.0–46.0)
MCHC: 33.9 g/dL (ref 30.0–36.0)
MCV: 97.6 fl (ref 78.0–100.0)
MONO ABS: 0.4 10*3/uL (ref 0.1–1.0)
MONOS PCT: 7.3 % (ref 3.0–12.0)
NEUTROS ABS: 3.7 10*3/uL (ref 1.4–7.7)
NEUTROS PCT: 68.1 % (ref 43.0–77.0)
Platelets: 316 10*3/uL (ref 150.0–400.0)
RBC: 3.91 Mil/uL (ref 3.87–5.11)
RDW: 12.7 % (ref 11.5–15.5)
WBC: 5.5 10*3/uL (ref 4.0–10.5)

## 2017-12-29 LAB — HEPATIC FUNCTION PANEL
ALBUMIN: 4.5 g/dL (ref 3.5–5.2)
ALT: 21 U/L (ref 0–35)
AST: 21 U/L (ref 0–37)
Alkaline Phosphatase: 76 U/L (ref 39–117)
Bilirubin, Direct: 0.1 mg/dL (ref 0.0–0.3)
Total Bilirubin: 0.6 mg/dL (ref 0.2–1.2)
Total Protein: 7 g/dL (ref 6.0–8.3)

## 2017-12-29 LAB — TSH: TSH: 1.43 u[IU]/mL (ref 0.35–4.50)

## 2017-12-29 NOTE — Patient Instructions (Signed)
It was great seeing you today   Someone will call you to schedule your colonoscopy   You can have your bone density screen done in January at your mammogram appointment.   We will follow up with you regarding your blood work

## 2017-12-29 NOTE — Progress Notes (Signed)
Subjective:    Patient ID: Tonya Bartlett, female    DOB: 1945/04/06, 72 y.o.   MRN: [id number]  HPI  Patient presents for yearly preventative medicine examination. She is a pleasant 72 year old female who  has a past medical history of Allergic rhinitis, Colitis, Complication of anesthesia, Depression, Diverticulosis, Frequency of urination, GERD (gastroesophageal reflux disease), History of DVT of lower extremity, History of hiatal hernia, History of iron deficiency anemia, History of migraine headaches, Hyperlipidemia, IBS (irritable bowel syndrome), Memory loss, Migraine headache, OA (osteoarthritis), PONV (postoperative nausea and vomiting), Recovering alcoholic ([place]), Right rotator cuff tear, and Unspecified essential hypertension.  Anxiety/Depression -she had her first appointment yesterday with behavioral health.  They started her on Lexapro 5 mg to increase to 10 mg in 1 week. She feels as recent health scares have caused her depression to become worse.   Migraines - Takes Imitrex PRN. Feels controlled.   Hypertension - Controlled with Cozaar 50 mg  BP Readings from Last 3 Encounters:  12/29/17 140/82  12/28/17 124/82  11/23/17 120/70   Hyperlipidemia - Currently prescribed Lipitor 20 mg. She denies any myalgias  Lab Results  Component Value Date   CHOL 236 (H) 11/05/2016   HDL 73.30 11/05/2016   LDLCALC 144 (H) 11/05/2016   LDLDIRECT 187.9 10/08/2009   TRIG 92.0 11/05/2016   CHOLHDL 3 11/05/2016   Melanoma - Left leg, diagnosed in October 2019. She has been seen by the skin surgery center and has surgical removal done. She has follow up appointment coming up for suture removal   Mild cognitive impairment - Appears stable. Is followed by Neurology and has upcoming appointment   Urethritis- was seen by urology. Has been started on low dose bactrim?. Continues to have burning periodically.   All immunizations and health maintenance protocols were reviewed with the patient  and needed orders were placed. UTD  Appropriate screening laboratory values were ordered for the patient including screening of hyperlipidemia, renal function and hepatic function.  Medication reconciliation,  past medical history, social history, problem list and allergies were reviewed in detail with the patient  Goals were established with regard to weight loss, exercise, and  diet in compliance with medications  End of life planning was discussed.  Is up to date on routine screening mammograms. She is due for Dexa Scan and Colonoscopy.   No acute complaints   Review of Systems  Constitutional: Negative.   HENT: Negative.   Eyes: Negative.   Respiratory: Negative.   Cardiovascular: Negative.   Gastrointestinal: Negative.   Endocrine: Negative.   Genitourinary: Negative.   Musculoskeletal: Negative.   Skin: Negative.   Allergic/Immunologic: Negative.   Neurological: Negative.   Hematological: Negative.   Psychiatric/Behavioral: Negative.    Past Medical History:  Diagnosis Date  . Allergic rhinitis   . Colitis   . Complication of anesthesia   . Depression   . Diverticulosis   . Frequency of urination   . GERD (gastroesophageal reflux disease)   . 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 History of hiatal hernia   . History of iron deficiency anemia   . History of migraine headaches   . Hyperlipidemia   . IBS (irritable bowel syndrome)   . Memory loss   . Migraine headache    hx of migraines   . OA (osteoarthritis)    RIGHT SHOULDER  . PONV (postoperative nausea and vomiting)   . Recovering  alcoholic ([place])    SINCE [id number]  . Right rotator cuff tear   . Unspecified essential hypertension     Social History   Socioeconomic History  . Marital status: Widowed    Spouse name: Not on file  . Number of children: Not on file  . Years of education: Not on file  . Highest education level: Not on file  Occupational History  . Not  on file  Social Needs  . Financial resource strain: Not on file  . Food insecurity:    Worry: Not on file    Inability: Not on file  . Transportation needs:    Medical: Not on file    Non-medical: Not on file  Tobacco Use  . Smoking status: Former Smoker    Packs/day: 0.50    Years: 15.00    Pack years: 7.50    Types: Cigarettes    Last attempt to quit: 05/15/1985    Years since quitting: 32.6  . Smokeless tobacco: Never Used  Substance and Sexual Activity  . Alcohol use: No    Comment: RECOVERING ALCOHOLIC--  QUIT [id number]  . Drug use: No  . Sexual activity: Never  Lifestyle  . Physical activity:    Days per week: Not on file    Minutes per session: Not on file  . Stress: Not on file  Relationships  . Social connections:    Talks on phone: Not on file    Gets together: Not on file    Attends religious service: Not on file    Active member of club or organization: Not on file    Attends meetings of clubs or organizations: Not on file    Relationship status: Not on file  . Intimate partner violence:    Fear of current or ex partner: Not on file    Emotionally abused: Not on file    Physically abused: Not on file    Forced sexual activity: Not on file  Other Topics Concern  . 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 BUNIONECTOMY/  HAMMERTOE CORRECTION  RIGHT FOOT  2011  . CATARACT EXTRACTION W/ INTRAOCULAR LENS  IMPLANT, BILATERAL    . KNEE ARTHROSCOPY W/ MENISCECTOMY Bilateral X2  LEFT /    X1  RIGHT  . REPLACEMENT TOTAL KNEE Left 2006  . SHOULDER ARTHROSCOPY WITH SUBACROMIAL DECOMPRESSION, ROTATOR CUFF REPAIR AND BICEP TENDON REPAIR Right 05/23/2013   Procedure: RIGHT SHOULDER ARTHROSCOPY EXAM UNDER ANESTHESIA  WITH SUBACROMIAL DECOMPRESSION,DISTAL CLAVICLE RESECTION, SADLABRAL  DEBRIDEMENT CHONDROPLASTY, BICEP TENOTOMY ;  Surgeon: Sydnee Cabal, MD;  Location: [facility];  Service: Orthopedics;  Laterality: Right;  . TONSILLECTOMY AND ADENOIDECTOMY  AGE 72  . TOTAL HIP ARTHROPLASTY Left 05/04/2017   Procedure: LEFT TOTAL HIP ARTHROPLASTY ANTERIOR APPROACH;  Surgeon: Paralee Cancel, MD;  Location[facility];  Service: Orthopedics;  Laterality: Left;  . TOTAL KNEE ARTHROPLASTY Bilateral LEFT  1996/   RIGHT 2004  . 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 VAGINAL HYSTERECTOMY  1976    Family History  Problem Relation Age of Onset  . Heart disease Father 22  . Hypertension Father   . Melanoma Mother   . Cancer Sister  skin CA, 2 sisters  . Esophageal cancer Brother   . Dementia Paternal Grandfather     Allergies  Allergen Reactions  . Penicillins Anaphylaxis    Has patient had a PCN reaction causing immediate rash, facial/tongue/throat swelling, SOB or lightheadedness with hypotension: Yes Has patient had a PCN reaction causing severe rash involving mucus membranes or skin necrosis: Yes Has patient had a PCN reaction that required hospitalization: No Has patient had a PCN reaction occurring within the last 10 year No If all of the above answers are "NO", then may proceed with Cephalosporin use.   . Erythromycin Other (See Comments)    SEVERE STOMACH CRAMPS  . Morphine And Related Nausea And Vomiting  . Nsaids Other (See Comments)    SEVERE STOMACH CRAMPS, MOUTH SORES **Able to tolerate Tylenol  . Tolmetin     SEVERE STOMACH CRAMPS  . Nickel Rash    Including snaps on hospital gowns     Current Outpatient Medications on File Prior to Visit  Medication Sig Dispense Refill  . betamethasone dipropionate 0.05 % lotion APPLY EXTERNALLY TO THE AFFECTED AREA TWICE DAILY 60 mL 2  . cetirizine (ZYRTEC) 10 MG tablet Take 10 mg by mouth daily.    Marland Kitchen escitalopram (LEXAPRO) 10 MG tablet TAKE 1 TABLET(10 MG) BY MOUTH DAILY 90 tablet 0  .  hydrocortisone cream 1 % Apply 1 application topically daily as needed for itching.    Marland Kitchen L-Methylfolate-B12-B6-B2 (CEREFOLIN) 07-17-48-5 MG TABS Take one caplet by mouth daily. 90 each 3  . losartan (COZAAR) 50 MG tablet TAKE 1 TABLET(50 MG) BY MOUTH DAILY 90 tablet 2  . nystatin (MYCOSTATIN/NYSTOP) powder Apply topically 2 (two) times daily. 60 g 3  . omeprazole (PRILOSEC) 20 MG capsule TAKE 1 CAPSULE(20 MG) BY MOUTH DAILY 90 capsule 1  . psyllium (REGULOID) 0.52 g capsule Take 0.52 g by mouth daily.    Marland Kitchen sulfamethoxazole-trimethoprim (BACTRIM,SEPTRA) 400-80 MG tablet Take 1 tablet by mouth 2 (two) times daily. For urethritis.    . SUMAtriptan (IMITREX) 100 MG tablet Take 1 tablet (100 mg total) by mouth every 2 (two) hours as needed for migraine. May repeat in 2 hours if headache persists or recurs. (Patient taking differently: Take 50 mg by mouth every 2 (two) hours as needed for migraine. May repeat in 2 hours if headache persists or recurs. ) 10 tablet 0  . traZODone (DESYREL) 50 MG tablet TAKE 1 TABLET BY MOUTH DAILY AT BEDTIME 90 tablet 0  . atorvastatin (LIPITOR) 20 MG tablet Take 1 tablet (20 mg total) by mouth daily. (Patient not taking: Reported on 12/29/2017) 90 tablet 3   No current facility-administered medications on file prior to visit.     BP 140/82   Temp (!) 97.4 F (36.3 C)   Ht 5' 5.5" (1.664 m)   Wt 217 lb (98.4 kg)   BMI 35.56 kg/m       Objective:   Physical Exam  Constitutional: She is oriented to person, place, and time. She appears well-developed and well-nourished. No distress.  HENT:  Head: Normocephalic and atraumatic.  Right Ear: External ear normal.  Left Ear: External ear normal.  Nose: Nose normal.  Mouth/Throat: Oropharynx is clear and moist. No oropharyngeal exudate.  Eyes: Pupils are equal, round, and reactive to light. Conjunctivae and EOM are normal. Right eye exhibits no discharge. Left eye exhibits no discharge. No scleral icterus.  Neck:  Normal range of motion. Neck supple. No JVD present. No tracheal deviation present.  No thyromegaly present.  Cardiovascular: Normal rate, regular rhythm, normal heart sounds and intact distal pulses. Exam reveals no gallop and no friction rub.  No murmur heard. Pulmonary/Chest: Effort normal and breath sounds normal. No stridor. No respiratory distress. She has no wheezes. She has no rales. She exhibits no tenderness. Right breast exhibits no inverted nipple, no mass, no nipple discharge, no skin change and no tenderness. Left breast exhibits no inverted nipple, no mass, no nipple discharge, no skin change and no tenderness.  Abdominal: Soft. Bowel sounds are normal. She exhibits no distension and no mass. There is no tenderness. There is no rebound and no guarding. No hernia.  Musculoskeletal: Normal range of motion. She exhibits no edema, tenderness or deformity.  Lymphadenopathy:    She has no cervical adenopathy.  Neurological: She is alert and oriented to person, place, and time. She displays normal reflexes. No cranial nerve deficit or sensory deficit. She exhibits normal muscle tone. Coordination normal.  Skin: Skin is warm and dry. Capillary refill takes less than 2 seconds. No rash noted. She is not diaphoretic. No erythema. No pallor.  Bandage noted to left leg ( melanoma excision). Scattered keratosis throughout torso. No concerning moles.   Psychiatric: She has a normal mood and affect. Her behavior is normal. Judgment and thought content normal.  Nursing note and vitals reviewed.     Assessment & Plan:  1. Essential hypertension, benign - Well controlled in the past but not at goal today.  - Will continue to monitor  - No change in medications  - Ambulatory referral to Gastroenterology - Basic metabolic panel - CBC with Differential/Platelet - Hepatic function panel - Lipid panel - TSH  2. Post-menopausal  - DG Bone Density; Future  3. Colon cancer screening  -  Ambulatory referral to Gastroenterology  4. Mixed hyperlipidemia - Consider increase in staitn  - Work on weight loss through diet and exercise - Basic metabolic panel - CBC with Differential/Platelet - Hepatic function panel - Lipid panel - TSH  5. Recurrent major depressive disorder, in partial remission ([place]) - Continue with psychiatry plan of care  6. Intractable migraine with status migrainosus, unspecified migraine type - Continue with imitrex PRN   [name], NP

## 2017-12-31 ENCOUNTER — Encounter: Payer: Self-pay | Admitting: Family Medicine

## 2018-01-05 DIAGNOSIS — M25552 Pain in left hip: Secondary | ICD-10-CM | POA: Diagnosis not present

## 2018-01-05 DIAGNOSIS — Z96642 Presence of left artificial hip joint: Secondary | ICD-10-CM | POA: Diagnosis not present

## 2018-01-05 DIAGNOSIS — Z471 Aftercare following joint replacement surgery: Secondary | ICD-10-CM | POA: Diagnosis not present

## 2018-01-27 ENCOUNTER — Ambulatory Visit (HOSPITAL_COMMUNITY): Payer: Medicare Other | Admitting: Psychiatry

## 2018-02-01 ENCOUNTER — Other Ambulatory Visit: Payer: Self-pay | Admitting: Gastroenterology

## 2018-02-01 ENCOUNTER — Other Ambulatory Visit: Payer: Self-pay | Admitting: Adult Health

## 2018-02-01 DIAGNOSIS — I10 Essential (primary) hypertension: Secondary | ICD-10-CM

## 2018-02-01 NOTE — Telephone Encounter (Signed)
Sent to the pharmacy by e-scribe. 

## 2018-02-03 DIAGNOSIS — Z85820 Personal history of malignant melanoma of skin: Secondary | ICD-10-CM | POA: Diagnosis not present

## 2018-02-03 DIAGNOSIS — D1801 Hemangioma of skin and subcutaneous tissue: Secondary | ICD-10-CM | POA: Diagnosis not present

## 2018-02-03 DIAGNOSIS — L821 Other seborrheic keratosis: Secondary | ICD-10-CM | POA: Diagnosis not present

## 2018-02-03 DIAGNOSIS — L400 Psoriasis vulgaris: Secondary | ICD-10-CM | POA: Diagnosis not present

## 2018-02-03 DIAGNOSIS — L814 Other melanin hyperpigmentation: Secondary | ICD-10-CM | POA: Diagnosis not present

## 2018-02-03 DIAGNOSIS — D225 Melanocytic nevi of trunk: Secondary | ICD-10-CM | POA: Diagnosis not present

## 2018-02-03 DIAGNOSIS — D485 Neoplasm of uncertain behavior of skin: Secondary | ICD-10-CM | POA: Diagnosis not present

## 2018-02-04 ENCOUNTER — Other Ambulatory Visit: Payer: Self-pay | Admitting: Adult Health

## 2018-02-04 ENCOUNTER — Encounter: Payer: Self-pay | Admitting: Gastroenterology

## 2018-02-04 DIAGNOSIS — Z1231 Encounter for screening mammogram for malignant neoplasm of breast: Secondary | ICD-10-CM

## 2018-02-15 ENCOUNTER — Other Ambulatory Visit: Payer: Self-pay | Admitting: Adult Health

## 2018-02-15 DIAGNOSIS — F339 Major depressive disorder, recurrent, unspecified: Secondary | ICD-10-CM

## 2018-02-15 NOTE — Telephone Encounter (Signed)
Does not look like pt is compliant with med.  Is she still taking this?

## 2018-02-24 ENCOUNTER — Ambulatory Visit (HOSPITAL_COMMUNITY): Payer: Medicare Other | Admitting: Psychiatry

## 2018-03-01 ENCOUNTER — Other Ambulatory Visit: Payer: Self-pay | Admitting: Surgery

## 2018-03-01 ENCOUNTER — Ambulatory Visit (AMBULATORY_SURGERY_CENTER): Payer: Self-pay | Admitting: *Deleted

## 2018-03-01 ENCOUNTER — Other Ambulatory Visit (HOSPITAL_COMMUNITY): Payer: Self-pay | Admitting: Psychiatry

## 2018-03-01 VITALS — Ht 66.0 in | Wt 223.0 lb

## 2018-03-01 DIAGNOSIS — Z86010 Personal history of colonic polyps: Secondary | ICD-10-CM

## 2018-03-01 DIAGNOSIS — G8929 Other chronic pain: Secondary | ICD-10-CM | POA: Diagnosis not present

## 2018-03-01 DIAGNOSIS — R1032 Left lower quadrant pain: Secondary | ICD-10-CM | POA: Diagnosis not present

## 2018-03-01 MED ORDER — NA SULFATE-K SULFATE-MG SULF 17.5-3.13-1.6 GM/177ML PO SOLN: ORAL | 0 refills | Status: DC

## 2018-03-01 NOTE — Progress Notes (Signed)
Patient denies any allergies to eggs or soy. Patient denies any problems with anesthesia/sedation. Vomit x1 after surgery years ago.  Patient denies any oxygen use at home. Patient denies taking any diet/weight loss medications or blood thinners. EMMI education offered, pt declined.

## 2018-03-04 ENCOUNTER — Telehealth: Payer: Self-pay | Admitting: Gastroenterology

## 2018-03-04 ENCOUNTER — Other Ambulatory Visit: Payer: Self-pay | Admitting: Adult Health

## 2018-03-04 NOTE — Telephone Encounter (Signed)
Pt is sched for colon March 15, 2018 with Dr. Havery Moros.  Pt states that pharmacist told her "no substitution"  Was checked on her pscrp so she could not get the generic.  Pt complained about the price of the prep.

## 2018-03-04 NOTE — Telephone Encounter (Signed)
Called and spoke to pt.  We do not have vouchers that would work for [employer].  Will check on possibility of sample.  She said if there is no generic she will pay for it.

## 2018-03-04 NOTE — Telephone Encounter (Signed)
Sent to the pharmacy by e-scribe for 6 months.  Confirmed with pt that she is still using.

## 2018-03-07 NOTE — Telephone Encounter (Signed)
Called pharmacy.  She has part D and has not met her deductible.  Called and notified patient. She expressed understanding and will go pick up her prep for her procedure next week.

## 2018-03-14 DIAGNOSIS — G5622 Lesion of ulnar nerve, left upper limb: Secondary | ICD-10-CM | POA: Diagnosis not present

## 2018-03-14 DIAGNOSIS — L4050 Arthropathic psoriasis, unspecified: Secondary | ICD-10-CM | POA: Diagnosis not present

## 2018-03-14 DIAGNOSIS — M5136 Other intervertebral disc degeneration, lumbar region: Secondary | ICD-10-CM | POA: Diagnosis not present

## 2018-03-14 DIAGNOSIS — M5030 Other cervical disc degeneration, unspecified cervical region: Secondary | ICD-10-CM | POA: Diagnosis not present

## 2018-03-14 DIAGNOSIS — M150 Primary generalized (osteo)arthritis: Secondary | ICD-10-CM | POA: Diagnosis not present

## 2018-03-14 DIAGNOSIS — L401 Generalized pustular psoriasis: Secondary | ICD-10-CM | POA: Diagnosis not present

## 2018-03-14 DIAGNOSIS — E669 Obesity, unspecified: Secondary | ICD-10-CM | POA: Diagnosis not present

## 2018-03-14 DIAGNOSIS — Z6835 Body mass index (BMI) 35.0-35.9, adult: Secondary | ICD-10-CM | POA: Diagnosis not present

## 2018-03-14 DIAGNOSIS — M25511 Pain in right shoulder: Secondary | ICD-10-CM | POA: Diagnosis not present

## 2018-03-15 ENCOUNTER — Encounter: Payer: Self-pay | Admitting: Gastroenterology

## 2018-03-15 ENCOUNTER — Ambulatory Visit (AMBULATORY_SURGERY_CENTER): Payer: Medicare Other | Admitting: Gastroenterology

## 2018-03-15 VITALS — BP 146/78 | HR 60 | Temp 98.0°F | Resp 11 | Ht 66.0 in | Wt 233.0 lb

## 2018-03-15 DIAGNOSIS — Z860100 Personal history of colon polyps, unspecified: Secondary | ICD-10-CM

## 2018-03-15 DIAGNOSIS — D128 Benign neoplasm of rectum: Secondary | ICD-10-CM | POA: Diagnosis not present

## 2018-03-15 DIAGNOSIS — D122 Benign neoplasm of ascending colon: Secondary | ICD-10-CM | POA: Diagnosis not present

## 2018-03-15 DIAGNOSIS — D127 Benign neoplasm of rectosigmoid junction: Secondary | ICD-10-CM

## 2018-03-15 DIAGNOSIS — D129 Benign neoplasm of anus and anal canal: Secondary | ICD-10-CM

## 2018-03-15 DIAGNOSIS — Z1211 Encounter for screening for malignant neoplasm of colon: Secondary | ICD-10-CM | POA: Diagnosis not present

## 2018-03-15 DIAGNOSIS — D123 Benign neoplasm of transverse colon: Secondary | ICD-10-CM

## 2018-03-15 MED ORDER — SODIUM CHLORIDE 0.9 % IV SOLN: 500.0000 mL | Freq: Once | INTRAVENOUS | Status: DC

## 2018-03-15 NOTE — Progress Notes (Signed)
Pt's states no medical or surgical changes since previsit or office visit. 

## 2018-03-15 NOTE — Progress Notes (Signed)
PT taken to PACU. Monitors in place. VSS. Report given to RN. 

## 2018-03-15 NOTE — Op Note (Signed)
[facility] Patient Name: Tonya Bartlett Procedure Date: 03/15/2018 8:56 AM MRN: [id number] Endoscopist: Remo Lipps P. Havery Moros , MD Age: 73 Referring MD:  Date of Birth: [birth year] Gender: Female Account #: [id number] Procedure:                Colonoscopy Indications:              Surveillance: History of numerous (> 10) adenomas                            on last colonoscopy Medicines:                Monitored Anesthesia Care Procedure:                Pre-Anesthesia Assessment:                           - Prior to the procedure, a History and Physical                            was performed, and patient medications and                            allergies were reviewed. The patient's tolerance of                            previous anesthesia was also reviewed. The risks                            and benefits of the procedure and the sedation                            options and risks were discussed with the patient.                            All questions were answered, and informed consent                            was obtained. Prior Anticoagulants: The patient has                            taken no previous anticoagulant or antiplatelet                            agents. ASA Grade Assessment: II - A patient with                            mild systemic disease. After reviewing the risks                            and benefits, the patient was deemed in                            satisfactory condition to undergo the procedure.  After obtaining informed consent, the colonoscope                            was passed under direct vision. Throughout the                            procedure, the patient's blood pressure, pulse, and                            oxygen saturations were monitored continuously. The                            Colonoscope was introduced through the anus and                            advanced to the the cecum,  identified by                            appendiceal orifice and ileocecal valve. The                            colonoscopy was performed without difficulty. The                            patient tolerated the procedure well. The quality                            of the bowel preparation was adequate. The                            ileocecal valve, appendiceal orifice, and rectum                            were photographed. Scope In: 9:05:05 AM Scope Out: 9:32:21 AM Scope Withdrawal Time: 0 hours 18 minutes 7 seconds  Total Procedure Duration: 0 hours 27 minutes 16 seconds  Findings:                 The perianal and digital rectal examinations were                            normal.                           A 4 to 5 mm polyp was found in the ascending colon.                            The polyp was flat. The polyp was removed with a                            cold snare. Resection and retrieval were complete.                           Two sessile polyps were found in the transverse  colon. The polyps were 3 to 4 mm in size. These                            polyps were removed with a cold snare. Resection                            and retrieval were complete.                           A 3 mm polyp was found in the recto-sigmoid colon.                            The polyp was sessile. The polyp was removed with a                            cold snare. Resection and retrieval were complete.                           A 3 mm polyp was found in the rectum. The polyp was                            sessile. The polyp was removed with a cold snare.                            Resection and retrieval were complete.                           Multiple small-mouthed diverticula were found in                            the sigmoid colon, transverse colon, ascending                            colon and cecum, with tortousity in the sigmoid                             colon.                           The exam was otherwise without abnormality.                            Retroflexed views of rectum not obtained due to                            small size of the rectum. Complications:            No immediate complications. Estimated blood loss:                            Minimal. Estimated Blood Loss:     Estimated blood loss was minimal. Impression:               - One 4 to 5 mm polyp in the  ascending colon,                            removed with a cold snare. Resected and retrieved.                           - Two 3 to 4 mm polyps in the transverse colon,                            removed with a cold snare. Resected and retrieved.                           - One 3 mm polyp at the recto-sigmoid colon,                            removed with a cold snare. Resected and retrieved.                           - One 3 mm polyp in the rectum, removed with a cold                            snare. Resected and retrieved.                           - Diverticulosis in the sigmoid colon, in the                            transverse colon, in the ascending colon and in the                            cecum.                           - The examination was otherwise normal. Recommendation:           - Patient has a contact number available for                            emergencies. The signs and symptoms of potential                            delayed complications were discussed with the                            patient. Return to normal activities tomorrow.                            Written discharge instructions were provided to the                            patient.                           - Resume previous diet.                           -  Continue present medications.                           - Await pathology results. Remo Lipps P. Juliannah Ohmann, MD 03/15/2018 9:36:57 AM This report has been signed electronically.

## 2018-03-15 NOTE — Patient Instructions (Signed)
Discharge instructions given. Handouts on polyps and Diverticulosis. Resume previous medications. YOU HAD AN ENDOSCOPIC PROCEDURE TODAY AT [facility]:   Refer to the procedure report that was given to you for any specific questions about what was found during the examination.  If the procedure report does not answer your questions, please call your gastroenterologist to clarify.  If you requested that your care partner not be given the details of your procedure findings, then the procedure report has been included in a sealed envelope for you to review at your convenience later.  YOU SHOULD EXPECT: Some feelings of bloating in the abdomen. Passage of more gas than usual.  Walking can help get rid of the air that was put into your GI tract during the procedure and reduce the bloating. If you had a lower endoscopy (such as a colonoscopy or flexible sigmoidoscopy) you may notice spotting of blood in your stool or on the toilet paper. If you underwent a bowel prep for your procedure, you may not have a normal bowel movement for a few days.  Please Note:  You might notice some irritation and congestion in your nose or some drainage.  This is from the oxygen used during your procedure.  There is no need for concern and it should clear up in a day or so.  SYMPTOMS TO REPORT IMMEDIATELY:   Following lower endoscopy (colonoscopy or flexible sigmoidoscopy):  Excessive amounts of blood in the stool  Significant tenderness or worsening of abdominal pains  Swelling of the abdomen that is new, acute  Fever of 100F or higher  For urgent or emergent issues, a gastroenterologist can be reached at any hour by calling [phone].   DIET:  We do recommend a small meal at first, but then you may proceed to your regular diet.  Drink plenty of fluids but you should avoid alcoholic beverages for 24 hours.  ACTIVITY:  You should plan to take it easy for the rest of today and you should NOT DRIVE  or use heavy machinery until tomorrow (because of the sedation medicines used during the test).    FOLLOW UP: Our staff will call the number listed on your records the next business day following your procedure to check on you and address any questions or concerns that you may have regarding the information given to you following your procedure. If we do not reach you, we will leave a message.  However, if you are feeling well and you are not experiencing any problems, there is no need to return our call.  We will assume that you have returned to your regular daily activities without incident.  If any biopsies were taken you will be contacted by phone or by letter within the next 1-3 weeks.  Please call us at [phone] if you have not heard about the biopsies in 3 weeks.    SIGNATURES/CONFIDENTIALITY: You and/or your care partner have signed paperwork which will be entered into your electronic medical record.  These signatures attest to the fact that that the information above on your After Visit Summary has been reviewed and is understood.  Full responsibility of the confidentiality of this discharge information lies with you and/or your care-partner.

## 2018-03-15 NOTE — Progress Notes (Signed)
Called to room to assist during endoscopic procedure.  Patient ID and intended procedure confirmed with present staff. Received instructions for my participation in the procedure from the performing physician.  

## 2018-03-16 ENCOUNTER — Telehealth: Payer: Self-pay

## 2018-03-16 NOTE — Telephone Encounter (Signed)
  Follow up Call-  Call back number 03/15/2018 04/15/2016 09/26/2015  Post procedure Call Back phone  # [phone]  Permission to leave phone message Yes Yes Yes  Some recent data might be hidden     Patient questions:  Do you have a fever, pain , or abdominal swelling? No. Pain Score  0 *  Have you tolerated food without any problems? Yes.    Have you been able to return to your normal activities? Yes.    Do you have any questions about your discharge instructions: Diet   No. Medications  No. Follow up visit  No.  Do you have questions or concerns about your Care? No.  Actions: * If pain score is 4 or above: No action needed, pain <4.

## 2018-03-16 NOTE — Telephone Encounter (Signed)
Attempted to reach patient for post-procedure f/u call. Phone rings, but no answer or answering service so unable to leave a message. Staff will attempt to reach her again later today.

## 2018-03-31 ENCOUNTER — Other Ambulatory Visit: Payer: Self-pay | Admitting: Adult Health

## 2018-03-31 DIAGNOSIS — E2839 Other primary ovarian failure: Secondary | ICD-10-CM

## 2018-03-31 DIAGNOSIS — Z13820 Encounter for screening for osteoporosis: Secondary | ICD-10-CM

## 2018-04-01 ENCOUNTER — Encounter: Payer: Self-pay | Admitting: Family Medicine

## 2018-04-01 ENCOUNTER — Ambulatory Visit (INDEPENDENT_AMBULATORY_CARE_PROVIDER_SITE_OTHER): Payer: Medicare Other | Admitting: Family Medicine

## 2018-04-01 ENCOUNTER — Ambulatory Visit
Admission: RE | Admit: 2018-04-01 | Discharge: 2018-04-01 | Disposition: A | Discharge status: Home / Self Care | Payer: Medicare Other | Source: Ambulatory Visit | Attending: Adult Health | Admitting: Adult Health

## 2018-04-01 ENCOUNTER — Other Ambulatory Visit: Payer: Self-pay

## 2018-04-01 VITALS — BP 130/70 | HR 72 | Temp 97.9°F | Wt 225.0 lb

## 2018-04-01 DIAGNOSIS — Z1231 Encounter for screening mammogram for malignant neoplasm of breast: Secondary | ICD-10-CM

## 2018-04-01 DIAGNOSIS — R21 Rash and other nonspecific skin eruption: Secondary | ICD-10-CM

## 2018-04-01 DIAGNOSIS — Z13820 Encounter for screening for osteoporosis: Secondary | ICD-10-CM

## 2018-04-01 DIAGNOSIS — M8589 Other specified disorders of bone density and structure, multiple sites: Secondary | ICD-10-CM | POA: Diagnosis not present

## 2018-04-01 DIAGNOSIS — E2839 Other primary ovarian failure: Secondary | ICD-10-CM

## 2018-04-01 DIAGNOSIS — Z780 Asymptomatic menopausal state: Secondary | ICD-10-CM | POA: Diagnosis not present

## 2018-04-01 MED ORDER — TRIAMCINOLONE ACETONIDE 0.1 % EX CREA: 1.0000 "application " | TOPICAL_CREAM | Freq: Two times a day (BID) | CUTANEOUS | 1 refills | Status: DC | PRN

## 2018-04-01 NOTE — Patient Instructions (Signed)
Try to avoid scratching as much as possible  May apply the topical steroid twice daily as needed.

## 2018-04-01 NOTE — Progress Notes (Signed)
Subjective:     Patient ID: Tonya Bartlett, female   DOB: October 29, 1945, 73 y.o.   MRN: [id number]  HPI Patient seen as a work in with pruritic rash left forearm localized to the dorsum of the forearm.  This started couple days ago.  She initially thought this was shingles.  However, she has had no pain whatsoever.  She has some raised lesions that are very pruritic.  No other areas of rash.  She takes Zyrtec at baseline.  She tried over-the-counter hydrocortisone cream without much improvement  She has history of inflammatory arthritis and is followed by rheumatology and takes methotrexate.  Just started back methotrexate on January 30  Past Medical History:  Diagnosis Date  . Allergic rhinitis   . Cancer ([place]) 2019   melanoma-leg   . Colitis   . Complication of anesthesia    vomit x1 while on Morphine per pt  . Depression   . Diverticulosis   . Frequency of urination   . GERD (gastroesophageal reflux disease)   . 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 History of hiatal hernia   . History of iron deficiency anemia   . History of migraine headaches   . Hyperlipidemia   . IBS (irritable bowel syndrome)   . Memory loss   . Migraine headache    hx of migraines   . OA (osteoarthritis)    RIGHT SHOULDER  . PONV (postoperative nausea and vomiting)   . Recovering alcoholic ([place])    SINCE [id number]  . Right rotator cuff tear   . Unspecified essential hypertension    Past Surgical History:  Procedure Laterality Date  . BUNIONECTOMY/  HAMMERTOE CORRECTION  RIGHT FOOT  2011  . CATARACT EXTRACTION W/ INTRAOCULAR LENS  IMPLANT, BILATERAL    . COLONOSCOPY    . KNEE ARTHROSCOPY W/ MENISCECTOMY Bilateral X2  LEFT /    X1  RIGHT  . MOHS SURGERY Left 12/15/2017   Melanoma in situ - left calf - [facility]  . REPLACEMENT TOTAL KNEE Left 2006  . SHOULDER ARTHROSCOPY WITH SUBACROMIAL DECOMPRESSION, ROTATOR CUFF REPAIR AND BICEP TENDON REPAIR Right  05/23/2013   Procedure: RIGHT SHOULDER ARTHROSCOPY EXAM UNDER ANESTHESIA  WITH SUBACROMIAL DECOMPRESSION,DISTAL CLAVICLE RESECTION, SADLABRAL DEBRIDEMENT CHONDROPLASTY, BICEP TENOTOMY ;  Surgeon: Sydnee Cabal, MD;  Location: [facility];  Service: Orthopedics;  Laterality: Right;  . TONSILLECTOMY AND ADENOIDECTOMY  AGE 1  . TOTAL HIP ARTHROPLASTY Left 05/04/2017   Procedure: LEFT TOTAL HIP ARTHROPLASTY ANTERIOR APPROACH;  Surgeon: Paralee Cancel, MD;  Location: [facility];  Service: Orthopedics;  Laterality: Left;  . TOTAL KNEE ARTHROPLASTY Bilateral LEFT  1996/   RIGHT 2004  . 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 VAGINAL HYSTERECTOMY  1976    reports that she quit smoking about 32 years ago. Her smoking use included cigarettes. She has a 7.50 pack-year smoking history. She has never used smokeless tobacco. She reports that she does not drink alcohol or use drugs. family history includes Cancer in her sister; Dementia in her paternal grandfather; Esophageal cancer in her brother; Heart disease (age of onset: 11) in her father; Hypertension in her father; Melanoma in her mother. Allergies  Allergen Reactions  . [place] Nail [Apremilast] Anaphylaxis    Suicidal ideation  . Penicillins Anaphylaxis    Has patient had a PCN reaction causing immediate rash, facial/tongue/throat swelling, SOB or lightheadedness with hypotension: Yes Has patient  had a PCN reaction causing severe rash involving mucus membranes or skin necrosis: Yes Has patient had a PCN reaction that required hospitalization: No Has patient had a PCN reaction occurring within the last 10 year No If all of the above answers are "NO", then may proceed with Cephalosporin use.   . Erythromycin Other (See Comments)    SEVERE STOMACH CRAMPS  . Morphine And Related Nausea And Vomiting  . Nsaids Other (See Comments)    SEVERE STOMACH CRAMPS, MOUTH SORES **Able to tolerate Tylenol  . Remicade [Infliximab] Other (See Comments)    Shut  down immune system-BP high  . Tolmetin     SEVERE STOMACH CRAMPS  . Nickel Rash    Including snaps on hospital gowns      Review of Systems  Constitutional: Negative for chills and fever.  Skin: Positive for rash.       Objective:   Physical Exam Constitutional:      Appearance: Normal appearance.  Skin:    Findings: Rash present.     Comments: She has slightly raised urticarial type rash left forearm in the area about 4 x 4 cm.  No pustules.  Nonscaly.  Nontender.  Neurological:     Mental Status: She is alert.        Assessment:     Allergic type skin rash left forearm.  Etiology unclear.    Plan:     -Recommend triamcinolone 0.1% cream twice daily -Touch base if not improving over the next couple weeks -Avoid scratching as much as possible  Eulas Post MD [facility]

## 2018-05-16 DIAGNOSIS — L401 Generalized pustular psoriasis: Secondary | ICD-10-CM | POA: Diagnosis not present

## 2018-05-16 DIAGNOSIS — M5136 Other intervertebral disc degeneration, lumbar region: Secondary | ICD-10-CM | POA: Diagnosis not present

## 2018-05-16 DIAGNOSIS — M5030 Other cervical disc degeneration, unspecified cervical region: Secondary | ICD-10-CM | POA: Diagnosis not present

## 2018-05-16 DIAGNOSIS — M150 Primary generalized (osteo)arthritis: Secondary | ICD-10-CM | POA: Diagnosis not present

## 2018-05-16 DIAGNOSIS — M25511 Pain in right shoulder: Secondary | ICD-10-CM | POA: Diagnosis not present

## 2018-05-16 DIAGNOSIS — G5622 Lesion of ulnar nerve, left upper limb: Secondary | ICD-10-CM | POA: Diagnosis not present

## 2018-05-16 DIAGNOSIS — L4050 Arthropathic psoriasis, unspecified: Secondary | ICD-10-CM | POA: Diagnosis not present

## 2018-05-20 ENCOUNTER — Telehealth: Payer: Self-pay | Admitting: Adult Health

## 2018-05-20 ENCOUNTER — Other Ambulatory Visit (HOSPITAL_COMMUNITY): Payer: Self-pay | Admitting: Psychiatry

## 2018-05-20 NOTE — Telephone Encounter (Signed)
Pt called requesting rx for Wellbutrin she said that she is normally gets it from he other doctor ,however she not able to go her appt because of covid . Therefore he will not give her a refill, pt wants to know if this can be authorize this for her.. Please advise.

## 2018-05-24 NOTE — Telephone Encounter (Signed)
[facility] psychiatrist should be able to refill for her or do a virtual visit

## 2018-05-24 NOTE — Telephone Encounter (Signed)
Spoke to the pt and she states that she called the psychiatrist and will be doing a virtual visit on 05/25/2018.  Nothing further needed.

## 2018-05-25 ENCOUNTER — Ambulatory Visit (INDEPENDENT_AMBULATORY_CARE_PROVIDER_SITE_OTHER): Payer: Medicare Other | Admitting: Psychiatry

## 2018-05-25 ENCOUNTER — Encounter (HOSPITAL_COMMUNITY): Payer: Self-pay | Admitting: Psychiatry

## 2018-05-25 DIAGNOSIS — F331 Major depressive disorder, recurrent, moderate: Secondary | ICD-10-CM | POA: Diagnosis not present

## 2018-05-25 DIAGNOSIS — Z87891 Personal history of nicotine dependence: Secondary | ICD-10-CM | POA: Diagnosis not present

## 2018-05-25 MED ORDER — ESCITALOPRAM OXALATE 10 MG PO TABS: 10.0000 mg | ORAL_TABLET | Freq: Every day | ORAL | 2 refills | Status: DC

## 2018-05-25 NOTE — Progress Notes (Signed)
[facility] Outpatient visit Tele psych  Patient Identification: Tonya Bartlett MRN:  [id number] Date of Evaluation:  05/25/2018 Referral Source: NP Chief Complaint:    Visit Diagnosis:  No diagnosis found.  History of Present Illness: 73 years old currently widowed white female lives by herself has 3 grown kids and 6 grandkids referred initially by family medicine for management of depression   I connected with Tonya Bartlett on 05/25/18 at  1:30 PM EDT by a video enabled telemedicine application and verified that I am speaking with the correct person using two identifiers.   I discussed the limitations of evaluation and management by telemedicine and the availability of in person appointments. The patient expressed understanding and agreed to proceed. Patient lost her husband in 2000 and since then she has felt that it is somewhat difficult and also has episodes of depression.  She lost her husband in 2000 and has had depression.  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  Hip replacement surgery.  Medical issues.  Some forgetfulness recently    Past Psychiatric History: depression  Previous Psychotropic Medications: Yes   Substance Abuse History in the last 12 months:  No.  Consequences of Substance Abuse: NA  Past Medical History:  Past Medical History:  Diagnosis Date  . Allergic rhinitis   . Cancer ([place]) 2019   melanoma-leg   . Colitis   . Complication of anesthesia    vomit x1 while on Morphine per pt  .  Depression   . Diverticulosis   . Frequency of urination   . GERD (gastroesophageal reflux disease)   . 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 History of hiatal hernia   . History of iron deficiency anemia   . History of migraine headaches   . Hyperlipidemia   . IBS (irritable bowel syndrome)   . Memory loss   . Migraine headache    hx of migraines   . OA (osteoarthritis)    RIGHT SHOULDER  . PONV (postoperative nausea and vomiting)   . Recovering alcoholic ([place])    SINCE [id number]  . Right rotator cuff tear   . Unspecified essential hypertension     Past Surgical History:  Procedure Laterality Date  . BUNIONECTOMY/  HAMMERTOE CORRECTION  RIGHT FOOT  2011  . CATARACT EXTRACTION W/ INTRAOCULAR LENS  IMPLANT, BILATERAL    . COLONOSCOPY    . KNEE ARTHROSCOPY W/ MENISCECTOMY Bilateral X2  LEFT /    X1  RIGHT  . MOHS SURGERY Left 12/15/2017   Melanoma in situ - [facility] calf - [facility]  . REPLACEMENT TOTAL KNEE Left 2006  . SHOULDER ARTHROSCOPY WITH SUBACROMIAL DECOMPRESSION, ROTATOR CUFF REPAIR AND BICEP TENDON REPAIR Right 05/23/2013   Procedure: RIGHT SHOULDER ARTHROSCOPY EXAM UNDER ANESTHESIA  WITH SUBACROMIAL DECOMPRESSION,DISTAL CLAVICLE RESECTION, SADLABRAL DEBRIDEMENT CHONDROPLASTY, BICEP TENOTOMY ;  Surgeon: Sydnee Cabal, MD;  Location: [place];  Service: Orthopedics;  Laterality:  Right;  Marland Kitchen TONSILLECTOMY AND ADENOIDECTOMY  AGE 77  . TOTAL HIP ARTHROPLASTY Left 05/04/2017   Procedure: LEFT TOTAL HIP ARTHROPLASTY ANTERIOR APPROACH;  Surgeon: Paralee Cancel, MD;  Location: [facility];  Service: Orthopedics;  Laterality: Left;  . TOTAL KNEE ARTHROPLASTY Bilateral LEFT  1996/   RIGHT 2004  . 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 VAGINAL HYSTERECTOMY  1976    Family Psychiatric History: mom possible depression  Family History:  Family History  Problem Relation Age of Onset  . Heart disease Father 25  . Hypertension Father    . Melanoma Mother   . Cancer Sister        skin CA, 2 sisters  . Esophageal cancer Brother   . Dementia Paternal Grandfather   . Colon cancer Neg Hx   . Colon polyps Neg Hx   . Stomach cancer Neg Hx   . Rectal cancer Neg Hx     Social History:   Social History   Socioeconomic History  . Marital status: Widowed    Spouse name: Not on file  . Number of children: Not on file  . Years of education: Not on file  . Highest education level: Not on file  Occupational History  . Not on file  Social Needs  . Financial resource strain: Not on file  . Food insecurity:    Worry: Not on file    Inability: Not on file  . Transportation needs:    Medical: Not on file    Non-medical: Not on file  Tobacco Use  . Smoking status: Former Smoker    Packs/day: 0.50    Years: 15.00    Pack years: 7.50    Types: Cigarettes    Last attempt to quit: 05/15/1985    Years since quitting: 33.0  . Smokeless tobacco: Never Used  Substance and Sexual Activity  . Alcohol use: No    Comment: RECOVERING ALCOHOLIC--  QUIT [id number]  . Drug use: No  . Sexual activity: Never  Lifestyle  . Physical activity:    Days per week: Not on file    Minutes per session: Not on file  . Stress: Not on file  Relationships  . Social connections:    Talks on phone: Not on file    Gets together: Not on file    Attends religious service: Not on file    Active member of club or organization: Not on file    Attends meetings of clubs or organizations: Not on file    Relationship status: Not on file  Other Topics Concern  . 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 Rutherford Nail [Apremilast] Anaphylaxis    Suicidal ideation  . Penicillins Anaphylaxis    Has patient had a PCN reaction causing immediate rash, facial/tongue/throat swelling,  SOB or lightheadedness with hypotension: Yes Has patient had a PCN reaction causing severe rash involving mucus membranes or skin necrosis: Yes Has patient had a PCN reaction that required hospitalization: No Has patient had a PCN reaction occurring within the last 10 year No If all of the above answers are "NO", then may proceed with Cephalosporin use.   . Erythromycin Other (See Comments)    SEVERE STOMACH CRAMPS  .  Morphine And Related Nausea And Vomiting  . Nsaids Other (See Comments)    SEVERE STOMACH CRAMPS, MOUTH SORES **Able to tolerate Tylenol  . Remicade [Infliximab] Other (See Comments)    Shut down immune system-BP high  . Tolmetin     SEVERE STOMACH CRAMPS  . Nickel Rash    Including snaps on hospital gowns     Metabolic Disorder Labs: Lab Results  Component Value Date   HGBA1C 5.3 11/05/2016   No results found for: PROLACTIN Lab Results  Component Value Date   CHOL 170 12/29/2017   TRIG 89.0 12/29/2017   HDL 59.60 12/29/2017   CHOLHDL 3 12/29/2017   VLDL 17.8 12/29/2017   LDLCALC 92 12/29/2017   LDLCALC 144 (H) 11/05/2016     Current Medications: Current Outpatient Medications  Medication Sig Dispense Refill  . atorvastatin (LIPITOR) 20 MG tablet TAKE 1 TABLET(20 MG) BY MOUTH DAILY 90 tablet 3  . betamethasone dipropionate 0.05 % lotion APPLY EXTERNALLY TO THE AFFECTED AREA TWICE DAILY 60 mL 2  . cetirizine (ZYRTEC) 10 MG tablet Take 10 mg by mouth daily.    Marland Kitchen escitalopram (LEXAPRO) 10 MG tablet Take 1 tablet (10 mg total) by mouth daily. 30 tablet 2  . hydrocortisone cream 1 % Apply 1 application topically daily as needed for itching.    Marland Kitchen L-Methylfolate-B12-B6-B2 (CEREFOLIN) 07-17-48-5 MG TABS Take one caplet by mouth daily. 90 each 3  . losartan (COZAAR) 50 MG tablet TAKE 1 TABLET(50 MG) BY MOUTH DAILY 90 tablet 3  . methotrexate (RHEUMATREX) 2.5 MG tablet 6 TABLETS ONCE A WEEK ORALLY 28 DAYS    . Na Sulfate-K Sulfate-Mg Sulf 17.5-3.13-1.6 GM/177ML  SOLN Suprep (no substitutions)-TAKE AS DIRECTED. 354 mL 0  . nystatin (MYCOSTATIN/NYSTOP) powder Apply topically 2 (two) times daily. 60 g 3  . omeprazole (PRILOSEC) 20 MG capsule TAKE 1 CAPSULE(20 MG) BY MOUTH DAILY 90 capsule 1  . psyllium (REGULOID) 0.52 g capsule Take 0.52 g by mouth daily.    Marland Kitchen sulfamethoxazole-trimethoprim (BACTRIM,SEPTRA) 400-80 MG tablet Take 1 tablet by mouth 2 (two) times daily. For urethritis.    . SUMAtriptan (IMITREX) 100 MG tablet Take 1 tablet (100 mg total) by mouth every 2 (two) hours as needed for migraine. May repeat in 2 hours if headache persists or recurs. (Patient taking differently: Take 50 mg by mouth every 2 (two) hours as needed for migraine. May repeat in 2 hours if headache persists or recurs. ) 10 tablet 0  . traZODone (DESYREL) 50 MG tablet TAKE 1 TABLET BY MOUTH DAILY AT BEDTIME 90 tablet 1  . triamcinolone cream (KENALOG) 0.1 % Apply 1 application topically 2 (two) times daily as needed. 30 g 1   No current facility-administered medications for this visit.      Psychiatric Specialty Exam: Review of Systems  Cardiovascular: Negative for chest pain.  Skin: Negative for rash.  Neurological: Negative for tremors.  Psychiatric/Behavioral: Negative for depression and suicidal ideas.    There were no vitals taken for this visit.There is no height or weight on file to calculate BMI.  General Appearance:   Eye Contact:    Speech:  Normal Rate  Volume:  Decreased  Mood: better  Affect:  Congruent  Thought Process:  Goal Directed  Orientation:  Full (Time, Place, and Person)  Thought Content:  Rumination  Suicidal Thoughts:  No  Homicidal Thoughts:  No  Memory:  Immediate;   Fair Recent;   Fair  Judgement:  Fair  Insight:  Fair  Psychomotor Activity:  Normal  Concentration:  Concentration: Fair and Attention Span: Fair  Recall:  [employer] of Knowledge:Good  Language: Good  Akathisia:  No  Handed:  Right  AIMS (if indicated):     Assets:  Desire for Improvement  ADL's:  Intact  Cognition: Impaired,  Mild  Sleep:  Variable to fair on meds    Treatment Plan Summary: Medication management and Plan as follows  1. MDD, mild to moderate: doing better, also dealing with grief . 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 The patient was provided an opportunity to ask questions and all were answered. The patient agreed with the plan and demonstrated an understanding of the instructions.   The patient was advised to call back or seek an in-person evaluation if the symptoms worsen or if the condition fails to improve as anticipated.  I provided 20 minutes of non-face-to-face time during this encounter. Fu 2-66m  [name], MD 4/8/20201:42 PM

## 2018-06-02 ENCOUNTER — Telehealth: Payer: Self-pay | Admitting: *Deleted

## 2018-06-02 NOTE — Telephone Encounter (Signed)
Results can be sent via fax to Dr Thora Lance please advise on BD results >> If you would like to [name] to call with results or set up a virtual visit.

## 2018-06-02 NOTE — Telephone Encounter (Signed)
ATC patient, no answer, no voicemail  MyChart message sent with results.   Results sent to Dr Amil Amen.   Will send back to [facility] to follow up.

## 2018-06-02 NOTE — Telephone Encounter (Signed)
Copied from [place] [id number]. Topic: Quick Communication - See Telephone Encounter >> Jun 02, 2018  9:51 AM Loma Boston wrote: CRM for notification. See Telephone encounter for: 06/02/18.PT stated that she had her Bone Scan done and that Dr. Amil Amen at [facility], her rheumatologist was wanting a copy  and that it had gone to [place]. She wants it shared with Amil Amen, states she has also not heard back from [place] on results

## 2018-06-02 NOTE — Telephone Encounter (Signed)
Ok to fax results to Dr. Amil Amen   Her BMD results were released to mychart....   The Bone Mineral Density exam showed that your bone mass is low. This can lead to osteoporosis and can also put you at greater risk for fracture. It is recommended optimizing calcium (1200 mg/day) and vitamin D (800 IU/day) intake along with weight bearing exercises.   Repeat bone density screen is appropriate after 2 years

## 2018-06-06 NOTE — Telephone Encounter (Signed)
Pt notified of results.  Advised to add 1200mg  of calcium and 800 iu of Vitamin D daily.  Nothing further needed.

## 2018-07-27 DIAGNOSIS — L738 Other specified follicular disorders: Secondary | ICD-10-CM | POA: Diagnosis not present

## 2018-07-27 DIAGNOSIS — D229 Melanocytic nevi, unspecified: Secondary | ICD-10-CM | POA: Diagnosis not present

## 2018-07-27 DIAGNOSIS — D692 Other nonthrombocytopenic purpura: Secondary | ICD-10-CM | POA: Diagnosis not present

## 2018-07-27 DIAGNOSIS — L820 Inflamed seborrheic keratosis: Secondary | ICD-10-CM | POA: Diagnosis not present

## 2018-07-27 DIAGNOSIS — D1801 Hemangioma of skin and subcutaneous tissue: Secondary | ICD-10-CM | POA: Diagnosis not present

## 2018-07-27 DIAGNOSIS — Z85820 Personal history of malignant melanoma of skin: Secondary | ICD-10-CM | POA: Diagnosis not present

## 2018-07-27 DIAGNOSIS — L821 Other seborrheic keratosis: Secondary | ICD-10-CM | POA: Diagnosis not present

## 2018-07-27 DIAGNOSIS — D225 Melanocytic nevi of trunk: Secondary | ICD-10-CM | POA: Diagnosis not present

## 2018-07-27 DIAGNOSIS — L304 Erythema intertrigo: Secondary | ICD-10-CM | POA: Diagnosis not present

## 2018-07-27 DIAGNOSIS — L728 Other follicular cysts of the skin and subcutaneous tissue: Secondary | ICD-10-CM | POA: Diagnosis not present

## 2018-08-03 ENCOUNTER — Other Ambulatory Visit: Payer: Self-pay

## 2018-08-03 MED ORDER — OMEPRAZOLE 20 MG PO CPDR: DELAYED_RELEASE_CAPSULE | ORAL | 0 refills | Status: DC

## 2018-08-09 DIAGNOSIS — M5030 Other cervical disc degeneration, unspecified cervical region: Secondary | ICD-10-CM | POA: Diagnosis not present

## 2018-08-09 DIAGNOSIS — M25511 Pain in right shoulder: Secondary | ICD-10-CM | POA: Diagnosis not present

## 2018-08-09 DIAGNOSIS — M5136 Other intervertebral disc degeneration, lumbar region: Secondary | ICD-10-CM | POA: Diagnosis not present

## 2018-08-09 DIAGNOSIS — G5622 Lesion of ulnar nerve, left upper limb: Secondary | ICD-10-CM | POA: Diagnosis not present

## 2018-08-09 DIAGNOSIS — L401 Generalized pustular psoriasis: Secondary | ICD-10-CM | POA: Diagnosis not present

## 2018-08-09 DIAGNOSIS — Z6835 Body mass index (BMI) 35.0-35.9, adult: Secondary | ICD-10-CM | POA: Diagnosis not present

## 2018-08-09 DIAGNOSIS — M150 Primary generalized (osteo)arthritis: Secondary | ICD-10-CM | POA: Diagnosis not present

## 2018-08-09 DIAGNOSIS — E669 Obesity, unspecified: Secondary | ICD-10-CM | POA: Diagnosis not present

## 2018-08-09 DIAGNOSIS — L4050 Arthropathic psoriasis, unspecified: Secondary | ICD-10-CM | POA: Diagnosis not present

## 2018-08-18 ENCOUNTER — Other Ambulatory Visit: Payer: Self-pay | Admitting: Physician Assistant

## 2018-08-18 DIAGNOSIS — M1611 Unilateral primary osteoarthritis, right hip: Secondary | ICD-10-CM | POA: Diagnosis not present

## 2018-08-18 DIAGNOSIS — Z96642 Presence of left artificial hip joint: Secondary | ICD-10-CM | POA: Diagnosis not present

## 2018-08-18 DIAGNOSIS — M25551 Pain in right hip: Secondary | ICD-10-CM | POA: Diagnosis not present

## 2018-08-18 DIAGNOSIS — M545 Low back pain: Secondary | ICD-10-CM | POA: Diagnosis not present

## 2018-08-22 ENCOUNTER — Other Ambulatory Visit (HOSPITAL_COMMUNITY): Payer: Self-pay

## 2018-08-22 MED ORDER — ESCITALOPRAM OXALATE 10 MG PO TABS: 10.0000 mg | ORAL_TABLET | Freq: Every day | ORAL | 0 refills | Status: DC

## 2018-08-23 ENCOUNTER — Encounter (HOSPITAL_COMMUNITY): Payer: Medicare Other

## 2018-08-24 ENCOUNTER — Other Ambulatory Visit: Payer: Self-pay | Admitting: Adult Health

## 2018-08-25 NOTE — Telephone Encounter (Signed)
Sent to the pharmacy by e-scribe. 

## 2018-08-26 ENCOUNTER — Encounter (HOSPITAL_COMMUNITY): Payer: Medicare Other

## 2018-08-31 ENCOUNTER — Encounter (HOSPITAL_COMMUNITY): Payer: Self-pay | Admitting: Psychiatry

## 2018-08-31 ENCOUNTER — Other Ambulatory Visit: Payer: Self-pay

## 2018-08-31 ENCOUNTER — Ambulatory Visit (INDEPENDENT_AMBULATORY_CARE_PROVIDER_SITE_OTHER): Payer: Medicare Other | Admitting: Psychiatry

## 2018-08-31 DIAGNOSIS — F331 Major depressive disorder, recurrent, moderate: Secondary | ICD-10-CM

## 2018-08-31 MED ORDER — ESCITALOPRAM OXALATE 10 MG PO TABS: 10.0000 mg | ORAL_TABLET | Freq: Every day | ORAL | 1 refills | Status: DC

## 2018-08-31 NOTE — Progress Notes (Signed)
[facility] Outpatient visit Tele psych  Patient Identification: Tonya Bartlett MRN:  [id number] Date of Evaluation:  08/31/2018 Referral Source: NP Chief Complaint:   depression follow up  Visit Diagnosis:    ICD-10-CM   1. Major depressive disorder, recurrent episode, moderate (HCC)  F33.1     History of Present Illness: 73 years old currently widowed white female lives by herself has 3 grown kids and 6 grandkids referred initially by family medicine for management of depression    I connected with Venna Berberich Tullis on 08/31/18 at 10:00 AM EDT by telephone and verified that I am speaking with the correct person using two identifiers.  I discussed the limitations of evaluation and management by telemedicine and the availability of in person appointments. The patient expressed understanding and agreed to proceed.  Handling grief better She lost her husband in 2000 and has had depression.  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  Hip replacement surgery.  Medical issues.    Past Psychiatric History: depression  Previous Psychotropic Medications: Yes   Substance Abuse History in the last 12 months:  No.  Consequences of Substance Abuse: NA  Past Medical History:  Past Medical History:  Diagnosis Date  . Allergic rhinitis   . Cancer ([place]) 2019   melanoma-leg   . Colitis   . Complication of anesthesia    vomit x1 while on Morphine per pt  . Depression   . Diverticulosis   . Frequency of urination   . GERD (gastroesophageal reflux disease)   . 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 History of hiatal hernia   . History of iron deficiency anemia   . History of migraine  headaches   . Hyperlipidemia   . IBS (irritable bowel syndrome)   . Memory loss   . Migraine headache    hx of migraines   . OA (osteoarthritis)    RIGHT SHOULDER  . PONV (postoperative nausea and vomiting)   . Recovering alcoholic ([place])    SINCE [id number]  . Right rotator cuff tear   . Unspecified essential hypertension     Past Surgical History:  Procedure Laterality Date  . BUNIONECTOMY/  HAMMERTOE CORRECTION  RIGHT FOOT  2011  . CATARACT EXTRACTION W/ INTRAOCULAR LENS  IMPLANT, BILATERAL    . COLONOSCOPY    . KNEE ARTHROSCOPY W/ MENISCECTOMY Bilateral X2  LEFT /    X1  RIGHT  . MOHS SURGERY Left 12/15/2017   Melanoma in situ - [facility] calf - [facility]  . REPLACEMENT TOTAL KNEE Left 2006  . SHOULDER ARTHROSCOPY WITH SUBACROMIAL DECOMPRESSION, ROTATOR CUFF REPAIR AND BICEP TENDON REPAIR Right 05/23/2013   Procedure: RIGHT SHOULDER ARTHROSCOPY EXAM UNDER ANESTHESIA  WITH SUBACROMIAL DECOMPRESSION,DISTAL CLAVICLE RESECTION, SADLABRAL DEBRIDEMENT CHONDROPLASTY, BICEP TENOTOMY ;  Surgeon: Sydnee Cabal, MD;  Location: [place];  Service: Orthopedics;  Laterality: Right;  . TONSILLECTOMY AND ADENOIDECTOMY  AGE 77  . TOTAL HIP ARTHROPLASTY Left 05/04/2017   Procedure: LEFT TOTAL HIP ARTHROPLASTY ANTERIOR APPROACH;  Surgeon: Paralee Cancel, MD;  [facility]: [facility];  Service: Orthopedics;  Laterality: Left;  . TOTAL KNEE ARTHROPLASTY Bilateral  LEFT  1996/   RIGHT 2004  . 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 VAGINAL HYSTERECTOMY  1976    Family Psychiatric History: mom possible depression  Family History:  Family History  Problem Relation Age of Onset  . Heart disease Father 69  . Hypertension Father   . Melanoma Mother   . Cancer Sister        skin CA, 2 sisters  . Esophageal cancer Brother   . Dementia Paternal Grandfather   . Colon cancer Neg Hx   . Colon polyps Neg Hx   . Stomach cancer Neg Hx   . Rectal cancer Neg Hx     Social History:   Social  History   Socioeconomic History  . Marital status: Widowed    Spouse name: Not on file  . Number of children: Not on file  . Years of education: Not on file  . Highest education level: Not on file  Occupational History  . Not on file  Social Needs  . Financial resource strain: Not on file  . Food insecurity    Worry: Not on file    Inability: Not on file  . Transportation needs    Medical: Not on file    Non-medical: Not on file  Tobacco Use  . Smoking status: Former Smoker    Packs/day: 0.50    Years: 15.00    Pack years: 7.50    Types: Cigarettes    Quit date: 05/15/1985    Years since quitting: 33.3  . Smokeless tobacco: Never Used  Substance and Sexual Activity  . Alcohol use: No    Comment: RECOVERING ALCOHOLIC--  QUIT [id number]  . Drug use: No  . Sexual activity: Never  Lifestyle  . Physical activity    Days per week: Not on file    Minutes per session: Not on file  . Stress: Not on file  Relationships  . Social Herbalist on phone: Not on file    Gets together: Not on file    Attends religious service: Not on file    Active member of club or organization: Not on file    Attends meetings of clubs or organizations: Not on file    Relationship status: Not on file  Other Topics Concern  . 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 Rutherford Nail [Apremilast] Anaphylaxis    Suicidal ideation  . Penicillins Anaphylaxis    Has patient had a PCN reaction causing immediate rash, facial/tongue/throat swelling, SOB or lightheadedness with hypotension: Yes Has patient had a PCN reaction causing severe rash involving mucus membranes or skin necrosis: Yes Has patient had a PCN reaction that required hospitalization: No Has patient had a PCN reaction occurring within the last 10 year  No If all of the above answers are "NO", then may proceed with Cephalosporin use.   . Erythromycin Other (See Comments)    SEVERE STOMACH CRAMPS  . Morphine And Related Nausea And Vomiting  . Nsaids Other (See Comments)    SEVERE STOMACH CRAMPS, MOUTH SORES **Able to tolerate Tylenol  . Remicade [Infliximab] Other (See Comments)    Shut down immune system-BP high  . Tolmetin     SEVERE STOMACH  CRAMPS  . Nickel Rash    Including snaps on hospital gowns     Metabolic Disorder Labs: Lab Results  Component Value Date   HGBA1C 5.3 11/05/2016   No results found for: PROLACTIN Lab Results  Component Value Date   CHOL 170 12/29/2017   TRIG 89.0 12/29/2017   HDL 59.60 12/29/2017   CHOLHDL 3 12/29/2017   VLDL 17.8 12/29/2017   LDLCALC 92 12/29/2017   LDLCALC 144 (H) 11/05/2016     Current Medications: Current Outpatient Medications  Medication Sig Dispense Refill  . atorvastatin (LIPITOR) 20 MG tablet TAKE 1 TABLET(20 MG) BY MOUTH DAILY 90 tablet 3  . betamethasone dipropionate 0.05 % lotion APPLY EXTERNALLY TO THE AFFECTED AREA TWICE DAILY 60 mL 2  . cetirizine (ZYRTEC) 10 MG tablet Take 10 mg by mouth daily.    Marland Kitchen escitalopram (LEXAPRO) 10 MG tablet Take 1 tablet (10 mg total) by mouth daily. 30 tablet 1  . hydrocortisone cream 1 % Apply 1 application topically daily as needed for itching.    Marland Kitchen L-Methylfolate-B12-B6-B2 (CEREFOLIN) 07-17-48-5 MG TABS Take one caplet by mouth daily. 90 each 3  . losartan (COZAAR) 50 MG tablet TAKE 1 TABLET(50 MG) BY MOUTH DAILY 90 tablet 3  . methotrexate (RHEUMATREX) 2.5 MG tablet 6 TABLETS ONCE A WEEK ORALLY 28 DAYS    . Na Sulfate-K Sulfate-Mg Sulf 17.5-3.13-1.6 GM/177ML SOLN Suprep (no substitutions)-TAKE AS DIRECTED. 354 mL 0  . nystatin (MYCOSTATIN/NYSTOP) powder Apply topically 2 (two) times daily. 60 g 3  . omeprazole (PRILOSEC) 20 MG capsule TAKE 1 CAPSULE(20 MG) BY MOUTH DAILY 90 capsule 0  . psyllium (REGULOID) 0.52 g capsule Take 0.52  g by mouth daily.    Marland Kitchen sulfamethoxazole-trimethoprim (BACTRIM,SEPTRA) 400-80 MG tablet Take 1 tablet by mouth 2 (two) times daily. For urethritis.    . SUMAtriptan (IMITREX) 100 MG tablet Take 1 tablet (100 mg total) by mouth every 2 (two) hours as needed for migraine. May repeat in 2 hours if headache persists or recurs. (Patient taking differently: Take 50 mg by mouth every 2 (two) hours as needed for migraine. May repeat in 2 hours if headache persists or recurs. ) 10 tablet 0  . traZODone (DESYREL) 50 MG tablet TAKE 1 TABLET BY MOUTH DAILY AT BEDTIME 90 tablet 1  . triamcinolone cream (KENALOG) 0.1 % Apply 1 application topically 2 (two) times daily as needed. 30 g 1   No current facility-administered medications for this visit.      Psychiatric Specialty Exam: Review of Systems  Cardiovascular: Negative for chest pain.  Skin: Negative for rash.  Psychiatric/Behavioral: Negative for depression and suicidal ideas.    There were no vitals taken for this visit.There is no height or weight on file to calculate BMI.  General Appearance:   Eye Contact:    Speech:  Normal Rate  Volume:  Decreased  Mood: fair  Affect:  Congruent  Thought Process:  Goal Directed  Orientation:  Full (Time, Place, and Person)  Thought Content:  Rumination  Suicidal Thoughts:  No  Homicidal Thoughts:  No  Memory:  Immediate;   Fair Recent;   Fair  Judgement:  Fair  Insight:  Fair  Psychomotor Activity:  Normal  Concentration:  Concentration: Fair and Attention Span: Fair  Recall:  [employer] of Knowledge:Good  Language: Good  Akathisia:  No  Handed:  Right  AIMS (if indicated):    Assets:  Desire for Improvement  ADL's:  Intact  Cognition: Impaired,  Mild  Sleep:  Variable to fair on meds    Treatment Plan Summary: Medication management and Plan as follows  1. 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 The patient was provided an opportunity to ask questions and all were answered. The patient agreed with the plan and demonstrated an understanding of the instructions.   The patient was advised to call back or seek an in-person evaluation if the symptoms worsen or if the condition fails to improve as anticipated.  Fu 40m. Renewed meds [name], MD 7/15/202010:15 AM

## 2018-09-01 ENCOUNTER — Other Ambulatory Visit: Payer: Self-pay

## 2018-09-01 ENCOUNTER — Telehealth: Payer: Self-pay | Admitting: Neurology

## 2018-09-01 ENCOUNTER — Ambulatory Visit (INDEPENDENT_AMBULATORY_CARE_PROVIDER_SITE_OTHER): Payer: Medicare Other | Admitting: Neurology

## 2018-09-01 ENCOUNTER — Encounter: Payer: Self-pay | Admitting: Neurology

## 2018-09-01 VITALS — BP 122/66 | HR 72 | Temp 98.0°F | Ht 66.0 in | Wt 222.6 lb

## 2018-09-01 DIAGNOSIS — G3184 Mild cognitive impairment, so stated: Secondary | ICD-10-CM

## 2018-09-01 DIAGNOSIS — M5136 Other intervertebral disc degeneration, lumbar region: Secondary | ICD-10-CM | POA: Diagnosis not present

## 2018-09-01 MED ORDER — CEREFOLIN 6-1-50-5 MG PO TABS: ORAL_TABLET | ORAL | 3 refills | Status: DC

## 2018-09-01 NOTE — Patient Instructions (Signed)
I had a long discussion with the patient regarding her mild short-term memory difficulties and cognitive impairment which appears stable.  Continue Cerefolin 1 tablet daily as well as increase participation in mentally challenging activities like solving crossword puzzles, playing bridge and sudoku.  We also discussed memory compensation strategies.  [facility]  She will return for follow-up in a year or call earlier if necessary.

## 2018-09-01 NOTE — Progress Notes (Signed)
[facility] [address] [place] [address] [phone]       OFFICE FOLLOW UP VISIT NOTE  Tonya. Tonya Bartlett Date of Birth:  [birth year] Medical Record Number:  [id number]   Referring MD:  [name], NP  Reason for Referral:  Memory loss HPI: Initial Consult 09/19/15 :  Tonya Bartlett is a 40 year Caucasian lady who for the last year and a half has been having mild memory and cognitive difficulties. She often misplaces objects but can remember later. She is also having word finding issues and trouble getting her words out. At times she has had trouble driving to places that she has driven down  To numerous times before and yet for a few seconds cannot remember where it done. She is also forgetting names of the patient's. 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 There is no history of strokes seizures significant head injury with loss of consciousness. There is no family history of Alzheimer's dementia. Patient has not had any brain imaging studies done on November done for reversible causes of cognitive impairment. She denies feeling depressed. Past medical history significant for irritable bowel syndrome and syncopal episodes. Update 12/09/2015 : She returns for follow-up after last visit 2 and half months ago. She continues from mild short-term memory difficulties but these appear to be stable and not getting worse. Patient did not fill the prescription for self: She forgot. She had MRI scan the brain done on 10/09/2015 which I personally reviewed and shows only mild changes of chronic microvascular ischemia without any structural lesions tumor infarcts. Lab work done on 8/ 3/17 showed normal vitamin B12, TSH and RPR. EEG done on 10/24/15 was normal. Patient was interested in participating in the [facility] early  dementia study but she does not have a reliable caregiver to help her participate. She states he is taking Celexa and her depression is well controlled. She has no new complaints today. Update 06/08/2016 : She returns for followup after last visit 6 months ago. She states she continues to have mi and cognitive difficulties which are however unchanged. She has trouble finding words occasionally and completing sentences. She has been taking Cerefolin nac and find it helps.  She does play solitaire but does not participate in any other cognitively challenging activities and states her job is quite taxing and stressful. She has no neurological complaints today.Marland Kitchen Update 08/30/2017 : She returns for follow-up after last visit more than a year ago. She continues to do well and states she has not had any worsening of her memory or cognitive difficulties. She continues to have short-term memory difficulties. 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 She also play solitaire a lot. 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 She is recovered from that and is now doing a lot of swimming to help her. She feels her depression has gotten worse and despite being on Wellbutrin. She plans to discuss with primary physician referral to a psychiatrist. Denies any other neurological complaints. She remains on; Cerefolin NAC which is tolerating well. Mini-Mental status exam score today was 30/30. Update 09/01/2018 : She returns for follow-up after last visit a year ago.  She states she is doing well.  She feels her memory difficulties in fact may be slightly better.  She remains on Cerefolin NAC which she takes regularly and needs a refill.  She still gets confused easily.  She often misses appointment.  In fact today she called stating that she would not be able to do  her video visit due to technical difficulties and was switched to in person visit.  Her son now lives with her and she enjoys having him.  Her daughter also spends a lot of time with her and comes over frequently.  She does play bridge.  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  Her blood pressures well controlled and today it is 122/66.  She is still bothered by bad hip and is planning to get a steroid injection in the next week. ROS:   14 system review of systems is positive for  restless legs, right leg involuntary movement.,  Right hip pain.  Memory loss, joint pain   walking difficulty,  and all other systems negative  PMH:  Past Medical History:  Diagnosis Date   Allergic rhinitis    Cancer ([place]) 2019   melanoma-leg    Colitis    Complication of anesthesia    vomit x1 while on Morphine per pt   Depression    Diverticulosis    Frequency of urination    GERD (gastroesophageal reflux disease)    History of DVT of lower extremity    POST LEFT TOTAL KNEE  1996   History of hiatal hernia    History of iron deficiency anemia    History of migraine headaches    Hyperlipidemia    IBS (irritable bowel syndrome)    Memory loss    Migraine headache    hx of migraines    OA (osteoarthritis)    RIGHT SHOULDER   PONV (postoperative nausea and vomiting)    Recovering alcoholic ([place])    SINCE [id number]   Right rotator cuff tear    Unspecified essential hypertension     Social History:  Social History   Socioeconomic History   Marital status: Widowed    Spouse name: Not on file   Number of children: Not on file   Years of education: Not on file   Highest education level: Not on file  Occupational History   Not on file  Social Needs   Financial resource strain: Not on file   Food insecurity    Worry: Not on file    Inability: Not on file   Transportation needs    Medical: Not on file    Non-medical: Not on file   Tobacco Use   Smoking status: Former Smoker    Packs/day: 0.50    Years: 15.00    Pack years: 7.50    Types: Cigarettes    Quit date: 05/15/1985    Years since quitting: 33.3   Smokeless tobacco: Never Used  Substance and Sexual Activity   Alcohol use: No    Comment: RECOVERING ALCOHOLIC--  QUIT [id number]   Drug use: No   Sexual activity: Never  Lifestyle   Physical activity    Days per week: Not on file    Minutes per session: Not on file   Stress: Not on file  Relationships   Social connections    Talks on phone: Not on file    Gets together: Not on file    Attends religious service: Not on file  Active member of club or organization: Not on file    Attends meetings of clubs or organizations: Not on file    Relationship status: Not on file   Intimate partner violence    Fear of current or ex partner: Not on file    Emotionally abused: Not on file    Physically abused: Not on file    Forced sexual activity: Not on file  Other Topics Concern   Not on file  Social History Narrative   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     escitalopram (LEXAPRO) 10 MG tablet Take 1 tablet (10 mg total) by mouth daily. 30 tablet 1   HYDROCODONE-APAP-DIETARY PROD PO hydroco/apap tab 5-325mg      hydrocortisone cream 1 % Apply 1 application topically daily as needed for itching.     losartan (COZAAR) 50 MG tablet TAKE 1 TABLET(50 MG) BY MOUTH DAILY 90 tablet 3   methotrexate (RHEUMATREX) 2.5 MG tablet 6 TABLETS ONCE A WEEK ORALLY  28 DAYS     Na Sulfate-K Sulfate-Mg Sulf 17.5-3.13-1.6 GM/177ML SOLN Suprep (no substitutions)-TAKE AS DIRECTED. 354 mL 0   nystatin (MYCOSTATIN/NYSTOP) powder Apply topically 2 (two) times daily. 60 g 3   omeprazole (PRILOSEC) 20 MG capsule TAKE 1 CAPSULE(20 MG) BY MOUTH DAILY 90 capsule 0   psyllium (REGULOID) 0.52 g capsule Take 0.52 g by mouth daily.     sulfamethoxazole-trimethoprim (BACTRIM,SEPTRA) 400-80 MG tablet Take 1 tablet by mouth 2 (two) times daily. For urethritis.     SUMAtriptan (IMITREX) 100 MG tablet Take 1 tablet (100 mg total) by mouth every 2 (two) hours as needed for migraine. May repeat in 2 hours if headache persists or recurs. (Patient taking differently: Take 50 mg by mouth every 2 (two) hours as needed for migraine. May repeat in 2 hours if headache persists or recurs. ) 10 tablet 0   traZODone (DESYREL) 50 MG tablet TAKE 1 TABLET BY MOUTH DAILY AT BEDTIME 90 tablet 1   triamcinolone cream (KENALOG) 0.1 % Apply 1 application topically 2 (two) times daily as needed. 30 g 1   No current facility-administered medications on file prior to visit.     Allergies:   Allergies  Allergen Reactions   Otezla [Apremilast] Anaphylaxis    Suicidal ideation   Penicillins Anaphylaxis    Has patient had a PCN reaction causing immediate rash, facial/tongue/throat swelling, SOB or lightheadedness with hypotension: Yes Has patient had a PCN reaction causing severe rash involving mucus membranes or skin necrosis: Yes Has patient had a PCN reaction that required hospitalization: No Has patient had a PCN reaction occurring within the last 10 year No If all of the above answers are "NO", then may proceed with Cephalosporin use.    Erythromycin Other (See Comments)    SEVERE STOMACH CRAMPS   Morphine And Related Nausea And Vomiting   Nsaids Other (See Comments)    SEVERE STOMACH CRAMPS, MOUTH SORES **Able to tolerate Tylenol  Remicade [Infliximab] Other (See Comments)     Shut down immune system-BP high   Tolmetin     SEVERE STOMACH CRAMPS   Nickel Rash    Including snaps on hospital gowns     Physical Exam General: well developed, well nourished elderly Caucasian lady, seated, in no evident distress Head: head normocephalic and atraumatic.   Neck: supple with no carotid or supraclavicular bruits Cardiovascular: regular rate and rhythm, no murmurs Musculoskeletal: no deformity Skin:  no rash/petichiae Vascular:  Normal pulses all extremities  Neurologic Exam Mental Status: Awake and fully alert. Oriented to place and time. Recent and remote memory intact. Attention span, concentration and fund of knowledge appropriate. Mood and affect appropriate. Mini-Mental status exam not done today.  Recall 3/3.. . Able to name 13 four legged animals. Clock drawing 4/4. Able to copy intersecting pentagons quite well. Cranial Nerves: Fundoscopic exam not done. Pupils equal, briskly reactive to light. Extraocular movements full without nystagmus. Visual fields full to confrontation. Hearing intact. Facial sensation intact. Face, tongue, palate moves normally and symmetrically.  Motor: Normal bulk and tone. Normal strength in all tested extremity muscles. Sensory.: intact to touch , pinprick , position and vibratory sensation.  Coordination: Rapid alternating movements normal in all extremities. Finger-to-nose and heel-to-shin performed accurately bilaterally. Gait and Station: Arises from chair without difficulty. Stance is normal. Gait demonstrates slight favoring of the right hip due to pain.  Unable to tandem walk without difficulty  reflexes: 1+ and symmetric. Toes downgoing.       ASSESSMENT: 5 year Caucasian lady with mild memory and cognitive difficulties likely due to mild cognitive impairment which appears stable.Marland Kitchen    PLAN: I had a long discussion with the patient regarding her mild short-term memory difficulties and cognitive impairment which  appears stable.  Continue Cerefolin 1 tablet daily as well as increase participation in mentally challenging activities like solving crossword puzzles, playing bridge and sudoku.  We also discussed memory compensation strategies.  [facility]  She will return for follow-up in a year or call earlier if necessary. .. Greater than 50% time during this 25 minute  visit was spent on counseling and coordination of care about her memory loss and  Mild cognitive impairment. and answering questions Antony Contras, MD  [facility] [address] [place], [place] [address]  Phone [phone] Fax [phone] Note: This document was prepared with digital dictation and possible smart phrase technology. Any transcriptional errors that result from this process are unintentional.

## 2018-09-07 DIAGNOSIS — M25551 Pain in right hip: Secondary | ICD-10-CM | POA: Diagnosis not present

## 2018-09-08 ENCOUNTER — Other Ambulatory Visit: Payer: Self-pay

## 2018-09-08 ENCOUNTER — Encounter (HOSPITAL_COMMUNITY)
Admission: RE | Admit: 2018-09-08 | Discharge: 2018-09-08 | Disposition: A | Discharge status: Home / Self Care | Payer: Medicare Other | Source: Ambulatory Visit | Attending: Physician Assistant | Admitting: Physician Assistant

## 2018-09-08 DIAGNOSIS — Z96642 Presence of left artificial hip joint: Secondary | ICD-10-CM | POA: Insufficient documentation

## 2018-09-08 DIAGNOSIS — M25552 Pain in left hip: Secondary | ICD-10-CM | POA: Diagnosis not present

## 2018-09-08 DIAGNOSIS — M25551 Pain in right hip: Secondary | ICD-10-CM | POA: Diagnosis not present

## 2018-09-08 MED ORDER — TECHNETIUM TC 99M MEDRONATE IV KIT
22.0000 | PACK | Freq: Once | INTRAVENOUS | Status: AC
  Administered 2018-09-08: 22 via INTRAVENOUS

## 2018-09-15 DIAGNOSIS — M25552 Pain in left hip: Secondary | ICD-10-CM | POA: Diagnosis not present

## 2018-09-15 DIAGNOSIS — M25551 Pain in right hip: Secondary | ICD-10-CM | POA: Diagnosis not present

## 2018-09-16 DIAGNOSIS — Z96642 Presence of left artificial hip joint: Secondary | ICD-10-CM | POA: Diagnosis not present

## 2018-09-16 DIAGNOSIS — M25552 Pain in left hip: Secondary | ICD-10-CM | POA: Diagnosis not present

## 2018-09-16 DIAGNOSIS — M1611 Unilateral primary osteoarthritis, right hip: Secondary | ICD-10-CM | POA: Diagnosis not present

## 2018-09-16 DIAGNOSIS — T8484XA Pain due to internal orthopedic prosthetic devices, implants and grafts, initial encounter: Secondary | ICD-10-CM | POA: Diagnosis not present

## 2018-09-20 DIAGNOSIS — M25552 Pain in left hip: Secondary | ICD-10-CM | POA: Diagnosis not present

## 2018-09-20 DIAGNOSIS — M25551 Pain in right hip: Secondary | ICD-10-CM | POA: Diagnosis not present

## 2018-09-28 DIAGNOSIS — M25551 Pain in right hip: Secondary | ICD-10-CM | POA: Diagnosis not present

## 2018-09-28 DIAGNOSIS — M25552 Pain in left hip: Secondary | ICD-10-CM | POA: Diagnosis not present

## 2018-10-31 ENCOUNTER — Ambulatory Visit (INDEPENDENT_AMBULATORY_CARE_PROVIDER_SITE_OTHER): Payer: Medicare Other

## 2018-10-31 ENCOUNTER — Other Ambulatory Visit: Payer: Self-pay

## 2018-10-31 DIAGNOSIS — Z23 Encounter for immunization: Secondary | ICD-10-CM | POA: Diagnosis not present

## 2018-11-03 ENCOUNTER — Other Ambulatory Visit: Payer: Self-pay

## 2018-11-03 MED ORDER — OMEPRAZOLE 20 MG PO CPDR: 20.0000 mg | DELAYED_RELEASE_CAPSULE | Freq: Every day | ORAL | 1 refills | Status: DC

## 2018-11-04 ENCOUNTER — Other Ambulatory Visit: Payer: Self-pay | Admitting: Gastroenterology

## 2018-11-04 MED ORDER — OMEPRAZOLE 20 MG PO CPDR: 20.0000 mg | DELAYED_RELEASE_CAPSULE | Freq: Every day | ORAL | 1 refills | Status: DC

## 2018-11-04 NOTE — Telephone Encounter (Signed)
Pt is scheduled for OV and requested a refill for omeprazole.

## 2018-11-04 NOTE — Telephone Encounter (Signed)
One refill has been given.

## 2018-11-08 ENCOUNTER — Other Ambulatory Visit: Payer: Self-pay

## 2018-11-08 MED ORDER — OMEPRAZOLE 20 MG PO CPDR: 20.0000 mg | DELAYED_RELEASE_CAPSULE | Freq: Every day | ORAL | 1 refills | Status: DC

## 2018-11-08 MED ORDER — OMEPRAZOLE 20 MG PO CPDR: 20.0000 mg | DELAYED_RELEASE_CAPSULE | Freq: Every day | ORAL | 0 refills | Status: DC

## 2018-11-08 NOTE — Progress Notes (Signed)
Script sent to the wrong pharmacy on 9-18. Resent to [facility]

## 2018-11-08 NOTE — Progress Notes (Signed)
Pt requested 90 day supply.

## 2018-11-15 ENCOUNTER — Telehealth: Payer: Self-pay | Admitting: Adult Health

## 2018-11-15 DIAGNOSIS — I10 Essential (primary) hypertension: Secondary | ICD-10-CM

## 2018-11-16 ENCOUNTER — Other Ambulatory Visit: Payer: Self-pay | Admitting: Adult Health

## 2018-11-16 DIAGNOSIS — I10 Essential (primary) hypertension: Secondary | ICD-10-CM

## 2018-11-16 DIAGNOSIS — Z760 Encounter for issue of repeat prescription: Secondary | ICD-10-CM

## 2018-11-16 MED ORDER — LOSARTAN POTASSIUM 50 MG PO TABS: ORAL_TABLET | ORAL | 0 refills | Status: DC

## 2018-11-16 MED ORDER — SUMATRIPTAN SUCCINATE 100 MG PO TABS: 100.0000 mg | ORAL_TABLET | ORAL | 0 refills | Status: DC | PRN

## 2018-11-16 NOTE — Telephone Encounter (Signed)
Called and spoke to the pharmacy. They filled it for 9 months and claimed there was no more refills on file.  Additional 90 days sent to the pharmacy by e-scribe.

## 2018-11-16 NOTE — Telephone Encounter (Signed)
DENIED. FILLED FOR 1 YEAR ON 02/01/2018.  REFILL REQUEST IS TOO EARLY.

## 2018-11-16 NOTE — Addendum Note (Signed)
Addended by: Miles Costain T on: 11/16/2018 11:42 AM   Modules accepted: Orders

## 2018-11-16 NOTE — Telephone Encounter (Signed)
Pt stated that the pharmacy would not refill her rx. She is not sure why as rx was good for 1 year. Please advise.

## 2018-11-21 DIAGNOSIS — R300 Dysuria: Secondary | ICD-10-CM | POA: Diagnosis not present

## 2018-11-21 DIAGNOSIS — N3020 Other chronic cystitis without hematuria: Secondary | ICD-10-CM | POA: Diagnosis not present

## 2018-11-21 DIAGNOSIS — R8271 Bacteriuria: Secondary | ICD-10-CM | POA: Diagnosis not present

## 2018-11-28 ENCOUNTER — Telehealth (INDEPENDENT_AMBULATORY_CARE_PROVIDER_SITE_OTHER): Payer: Medicare Other | Admitting: Family Medicine

## 2018-11-28 ENCOUNTER — Other Ambulatory Visit: Payer: Self-pay

## 2018-11-28 ENCOUNTER — Other Ambulatory Visit: Payer: Self-pay | Admitting: *Deleted

## 2018-11-28 DIAGNOSIS — B351 Tinea unguium: Secondary | ICD-10-CM

## 2018-11-28 DIAGNOSIS — Z20822 Contact with and (suspected) exposure to covid-19: Secondary | ICD-10-CM

## 2018-11-28 DIAGNOSIS — Z20828 Contact with and (suspected) exposure to other viral communicable diseases: Secondary | ICD-10-CM | POA: Diagnosis not present

## 2018-11-28 NOTE — Progress Notes (Signed)
Virtual Visit via Video Note  I connected with Corinna Burkman on 11/28/18 at 10:00 AM EDT by a video enabled telemedicine application 2/2 [id number] pandemic and verified that I am speaking with the correct person using two identifiers.  Location patient: home Location provider:work or home office Persons participating in the virtual visit: patient, provider  I discussed the limitations of evaluation and management by telemedicine and the availability of in person appointments. The patient expressed understanding and agreed to proceed.   HPI: Pt is a 73 yo female followed by [name], NP seen for acute concern.  Pt states she noticed dark colored spots on 3 fingernails 3 days ago.  She recently used a new nail dipping/polish kit with her daughter.  L hand with middle and 5 th digit and R ring finger with the discoloration. Pt's daughter does not have any nail discoloration.  Pt's last visit to a nail salon was in March prior to the COVID-19 pandemic.  Pt endorses increased washing dishes by hand without gloves since July as her dishwasher went out.  It was recently replaced.  Pt has not tried anything for her symptoms.  She denies fever, chills, rash, n/v.   ROS: See pertinent positives and negatives per HPI.  Past Medical History:  Diagnosis Date  . Allergic rhinitis   . Cancer ([place]) 2019   melanoma-leg   . Colitis   . Complication of anesthesia    vomit x1 while on Morphine per pt  . Depression   . Diverticulosis   . Frequency of urination   . GERD (gastroesophageal reflux disease)   . 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 History of hiatal hernia   . History of iron deficiency anemia   . History of migraine headaches   . Hyperlipidemia   . IBS (irritable bowel syndrome)   . Memory loss   . Migraine headache    hx of migraines   . OA (osteoarthritis)    RIGHT SHOULDER  . PONV (postoperative nausea and vomiting)   . Recovering alcoholic ([place])     SINCE [id number]  . Right rotator cuff tear   . Unspecified essential hypertension     Past Surgical History:  Procedure Laterality Date  . BUNIONECTOMY/  HAMMERTOE CORRECTION  RIGHT FOOT  2011  . CATARACT EXTRACTION W/ INTRAOCULAR LENS  IMPLANT, BILATERAL    . COLONOSCOPY    . KNEE ARTHROSCOPY W/ MENISCECTOMY Bilateral X2  LEFT /    X1  RIGHT  . MOHS SURGERY Left 12/15/2017   Melanoma in situ - left calf - [facility]  . REPLACEMENT TOTAL KNEE Left 2006  . SHOULDER ARTHROSCOPY WITH SUBACROMIAL DECOMPRESSION, ROTATOR CUFF REPAIR AND BICEP TENDON REPAIR Right 05/23/2013   Procedure: RIGHT SHOULDER ARTHROSCOPY EXAM UNDER ANESTHESIA  WITH SUBACROMIAL DECOMPRESSION,DISTAL CLAVICLE RESECTION, SADLABRAL DEBRIDEMENT CHONDROPLASTY, BICEP TENOTOMY ;  Surgeon: Sydnee Cabal, MD;  Location: [facility];  Service: Orthopedics;  Laterality: Right;  . TONSILLECTOMY AND ADENOIDECTOMY  AGE 77  . TOTAL HIP ARTHROPLASTY Left 05/04/2017   Procedure: LEFT TOTAL HIP ARTHROPLASTY ANTERIOR APPROACH;  Surgeon: Paralee Cancel, MD;  Location: [facility];  Service: Orthopedics;  Laterality: Left;  . TOTAL KNEE ARTHROPLASTY Bilateral LEFT  1996/   RIGHT 2004  . 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 VAGINAL HYSTERECTOMY  1976    Family History  Problem Relation Age of Onset  . Heart disease Father  40  . Hypertension Father   . Melanoma Mother   . Cancer Sister        skin CA, 2 sisters  . Esophageal cancer Brother   . Dementia Paternal Grandfather   . Colon cancer Neg Hx   . Colon polyps Neg Hx   . Stomach cancer Neg Hx   . Rectal cancer Neg Hx      Current Outpatient Medications:  .  atorvastatin (LIPITOR) 20 MG tablet, TAKE 1 TABLET(20 MG) BY MOUTH DAILY, Disp: 90 tablet, Rfl: 3 .  betamethasone dipropionate 0.05 % lotion, APPLY EXTERNALLY TO THE AFFECTED AREA TWICE DAILY, Disp: 60 mL, Rfl: 2 .  cetirizine (ZYRTEC) 10 MG tablet, Take 10 mg by mouth daily., Disp: , Rfl:  .   escitalopram (LEXAPRO) 10 MG tablet, Take 1 tablet (10 mg total) by mouth daily., Disp: 30 tablet, Rfl: 1 .  HYDROCODONE-APAP-DIETARY PROD PO, hydroco/apap tab 5-354m, Disp: , Rfl:  .  hydrocortisone cream 1 %, Apply 1 application topically daily as needed for itching., Disp: , Rfl:  .  L-Methylfolate-B12-B6-B2 (CEREFOLIN) 07-17-48-5 MG TABS, Take one caplet by mouth daily., Disp: 90 tablet, Rfl: 3 .  losartan (COZAAR) 50 MG tablet, TAKE 1 TABLET(50 MG) BY MOUTH DAILY, Disp: 90 tablet, Rfl: 0 .  methotrexate (RHEUMATREX) 2.5 MG tablet, 6 TABLETS ONCE A WEEK ORALLY 28 DAYS, Disp: , Rfl:  .  Na Sulfate-K Sulfate-Mg Sulf 17.5-3.13-1.6 GM/177ML SOLN, Suprep (no substitutions)-TAKE AS DIRECTED., Disp: 354 mL, Rfl: 0 .  nystatin (MYCOSTATIN/NYSTOP) powder, Apply topically 2 (two) times daily., Disp: 60 g, Rfl: 3 .  omeprazole (PRILOSEC) 20 MG capsule, Take 1 capsule (20 mg total) by mouth daily. Please schedule an appointment for further refills: [phone] Disp: 90 capsule, Rfl: 0 .  psyllium (REGULOID) 0.52 g capsule, Take 0.52 g by mouth daily., Disp: , Rfl:  .  sulfamethoxazole-trimethoprim (BACTRIM,SEPTRA) 400-80 MG tablet, Take 1 tablet by mouth 2 (two) times daily. For urethritis., Disp: , Rfl:  .  SUMAtriptan (IMITREX) 100 MG tablet, Take 1 tablet (100 mg total) by mouth every 2 (two) hours as needed for migraine. May repeat in 2 hours if headache persists or recurs., Disp: 10 tablet, Rfl: 0 .  traZODone (DESYREL) 50 MG tablet, TAKE 1 TABLET BY MOUTH DAILY AT BEDTIME, Disp: 90 tablet, Rfl: 1 .  triamcinolone cream (KENALOG) 0.1 %, Apply 1 application topically 2 (two) times daily as needed., Disp: 30 g, Rfl: 1  EXAM:  VITALS per patient if applicable:  GENERAL: alert, oriented, appears well and in no acute distress  HEENT: atraumatic, conjunctiva clear, no obvious abnormalities on inspection of external nose and ears  NECK: normal movements of the head and neck  LUNGS: on inspection no  signs of respiratory distress, breathing rate appears normal, no obvious gross SOB, gasping or wheezing  CV: no obvious cyanosis  SKIN: L 3rd and 5th digits with browninsh/yellow/green discoloration across a portion of the fingernail.  R 4th digit with similar discoloration across fingernail.  No erythema or drainage noted.  MS: moves all visible extremities without noticeable abnormality  PSYCH/NEURO: pleasant and cooperative, no obvious depression or anxiety, speech and thought processing grossly intact  ASSESSMENT AND PLAN:  Discussed the following assessment and plan:  Onychomycosis  -several fingernails affected, likely 2/2 increased moisture from washing dishes by hand without wearing gloves. -discussed various medication options. -pt will try OTC antifungal.   -advised any treatment likely to take several months before improvement noted. -discussed po  antifungal an option, however advised would need to f/u with pcp for labs (LFTs) and review of current meds prior to starting such medication. -questions answered to satisfaction. -f/u prn with pcp   I discussed the assessment and treatment plan with the patient. The patient was provided an opportunity to ask questions and all were answered. The patient agreed with the plan and demonstrated an understanding of the instructions.   The patient was advised to call back or seek an in-person evaluation if the symptoms worsen or if the condition fails to improve as anticipated.   Billie Ruddy, MD

## 2018-11-28 NOTE — Patient Instructions (Signed)
Fungal Nail Infection A fungal nail infection is a common infection of the toenails or fingernails. This condition affects toenails more often than fingernails. It often affects the great, or big, toes. More than one nail may be infected. The condition can be passed from person to person (is contagious). What are the causes? This condition is caused by a fungus. Several types of fungi can cause the infection. These fungi are common in moist and warm areas. If your hands or feet come into contact with the fungus, it may get into a crack in your fingernail or toenail and cause the infection. What increases the risk? The following factors may make you more likely to develop this condition:  Being female.  Being of older age.  Living with someone who has the fungus.  Walking barefoot in areas where the fungus thrives, such as showers or locker rooms.  Wearing shoes and socks that cause your feet to sweat.  Having a nail injury or a recent nail surgery.  Having certain medical conditions, such as: ? Athlete's foot. ? Diabetes. ? Psoriasis. ? Poor circulation. ? A weak body defense system (immune system). What are the signs or symptoms? Symptoms of this condition include:  A pale spot on the nail.  Thickening of the nail.  A nail that becomes yellow or brown.  A brittle or ragged nail edge.  A crumbling nail.  A nail that has lifted away from the nail bed. How is this diagnosed? This condition is diagnosed with a physical exam. Your health care provider may take a scraping or clipping from your nail to test for the fungus. How is this treated? Treatment is not needed for mild infections. If you have significant nail changes, treatment may include:  Antifungal medicines taken by mouth (orally). You may need to take the medicine for several weeks or several months, and you may not see the results for a long time. These medicines can cause side effects. Ask your health care provider  what problems to watch for.  Antifungal nail polish or nail cream. These may be used along with oral antifungal medicines.  Laser treatment of the nail.  Surgery to remove the nail. This may be needed for the most severe infections. It can take a long time, usually up to a year, for the infection to go away. The infection may also come back. Follow these instructions at home: Medicines  Take or apply over-the-counter and prescription medicines only as told by your health care provider.  Ask your health care provider about using over-the-counter mentholated ointment on your nails. Nail care  Trim your nails often.  Wash and dry your hands and feet every day.  Keep your feet dry: ? Wear absorbent socks, and change your socks frequently. ? Wear shoes that allow air to circulate, such as sandals or canvas tennis shoes. Throw out old shoes.  Do not use artificial nails.  If you go to a nail salon, make sure you choose one that uses clean instruments.  Use antifungal foot powder on your feet and in your shoes. General instructions  Do not share personal items, such as towels or nail clippers.  Do not walk barefoot in shower rooms or locker rooms.  Wear rubber gloves if you are working with your hands in wet areas.  Keep all follow-up visits as told by your health care provider. This is important. Contact a health care provider if: Your infection is not getting better or it is getting worse   after several months. Summary  A fungal nail infection is a common infection of the toenails or fingernails.  Treatment is not needed for mild infections. If you have significant nail changes, treatment may include taking medicine orally and applying medicine to your nails.  It can take a long time, usually up to a year, for the infection to go away. The infection may also come back.  Take or apply over-the-counter and prescription medicines only as told by your health care provider.   Follow instructions for taking care of your nails to help prevent infection from coming back or spreading. This information is not intended to replace advice given to you by your health care provider. Make sure you discuss any questions you have with your health care provider. Document Released: 01/31/2000 Document Revised: 05/26/2018 Document Reviewed: 07/09/2017 Elsevier Patient Education  2020 [employer].  

## 2018-11-29 LAB — NOVEL CORONAVIRUS, NAA: SARS-CoV-2, NAA: NOT DETECTED

## 2018-12-01 ENCOUNTER — Ambulatory Visit (INDEPENDENT_AMBULATORY_CARE_PROVIDER_SITE_OTHER): Payer: Medicare Other | Admitting: Psychiatry

## 2018-12-01 ENCOUNTER — Encounter (HOSPITAL_COMMUNITY): Payer: Self-pay | Admitting: Psychiatry

## 2018-12-01 DIAGNOSIS — F331 Major depressive disorder, recurrent, moderate: Secondary | ICD-10-CM

## 2018-12-01 MED ORDER — ESCITALOPRAM OXALATE 10 MG PO TABS: 10.0000 mg | ORAL_TABLET | Freq: Every day | ORAL | 0 refills | Status: DC

## 2018-12-01 NOTE — Progress Notes (Signed)
[facility] Outpatient visit Tele psych  Patient Identification: Tonya Bartlett MRN:  [id number] Date of Evaluation:  12/01/2018 Referral Source: NP Chief Complaint:   depression follow up  Visit Diagnosis:    ICD-10-CM   1. Major depressive disorder, recurrent episode, moderate (HCC)  F33.1     History of Present Illness: 73 years old currently widowed white female lives by herself has 3 grown kids and 6 grandkids referred initially by family medicine for management of depression   I connected with Tammara Faris Mittelman on 12/01/18 at  4:00 PM EDT by telephone and verified that I am speaking with the correct person using two identifiers. I discussed the limitations of evaluation and management by telemedicine and the availability of in person appointments. The patient expressed understanding and agreed to proceed.  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  Hip replacement surgery.  Medical issues.    Past Psychiatric History: depression  Previous Psychotropic Medications: Yes   Substance Abuse History in the last 12 months:  No.  Consequences of Substance Abuse: NA  Past Medical History:  Past Medical History:  Diagnosis Date  . Allergic rhinitis   . Cancer ([place]) 2019   melanoma-leg   . Colitis   . Complication of anesthesia    vomit x1 while on Morphine per pt  . Depression   . Diverticulosis   . Frequency of urination   . GERD (gastroesophageal reflux disease)   . 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 History of hiatal hernia   . History of iron deficiency anemia   . History of migraine headaches   . Hyperlipidemia   . IBS (irritable bowel syndrome)   . Memory loss   . Migraine headache    hx of migraines   . OA (osteoarthritis)    RIGHT SHOULDER  . PONV  (postoperative nausea and vomiting)   . Recovering alcoholic ([place])    SINCE [id number]  . Right rotator cuff tear   . Unspecified essential hypertension     Past Surgical History:  Procedure Laterality Date  . BUNIONECTOMY/  HAMMERTOE CORRECTION  RIGHT FOOT  2011  . CATARACT EXTRACTION W/ INTRAOCULAR LENS  IMPLANT, BILATERAL    . COLONOSCOPY    . KNEE ARTHROSCOPY W/ MENISCECTOMY Bilateral X2  LEFT /    X1  RIGHT  . MOHS SURGERY Left 12/15/2017   Melanoma in situ - left calf - [facility]  . REPLACEMENT TOTAL KNEE Left 2006  . SHOULDER ARTHROSCOPY WITH SUBACROMIAL DECOMPRESSION, ROTATOR CUFF REPAIR AND BICEP TENDON REPAIR Right 05/23/2013   Procedure: RIGHT SHOULDER ARTHROSCOPY EXAM UNDER ANESTHESIA  WITH SUBACROMIAL DECOMPRESSION,DISTAL CLAVICLE RESECTION, SADLABRAL DEBRIDEMENT CHONDROPLASTY, BICEP TENOTOMY ;  Surgeon: Sydnee Cabal, MD;  Location: [place];  Service: Orthopedics;  Laterality: Right;  . TONSILLECTOMY AND ADENOIDECTOMY  AGE 20  . TOTAL HIP ARTHROPLASTY Left 05/04/2017   Procedure: LEFT TOTAL HIP ARTHROPLASTY ANTERIOR APPROACH;  Surgeon: Paralee Cancel, MD;  Location: [facility];  Service: Orthopedics;  Laterality: Left;  . TOTAL KNEE ARTHROPLASTY Bilateral LEFT  1996/   RIGHT 2004  . 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 VAGINAL HYSTERECTOMY  1976    Family Psychiatric History: mom possible depression  Family History:  Family  History  Problem Relation Age of Onset  . Heart disease Father 19  . Hypertension Father   . Melanoma Mother   . Cancer Sister        skin CA, 2 sisters  . Esophageal cancer Brother   . Dementia Paternal Grandfather   . Colon cancer Neg Hx   . Colon polyps Neg Hx   . Stomach cancer Neg Hx   . Rectal cancer Neg Hx     Social History:   Social History   Socioeconomic History  . Marital status: Widowed    Spouse name: Not on file  . Number of children: Not on file  . Years of education: Not on file  . Highest  education level: Not on file  Occupational History  . Not on file  Social Needs  . Financial resource strain: Not on file  . Food insecurity    Worry: Not on file    Inability: Not on file  . Transportation needs    Medical: Not on file    Non-medical: Not on file  Tobacco Use  . Smoking status: Former Smoker    Packs/day: 0.50    Years: 15.00    Pack years: 7.50    Types: Cigarettes    Quit date: 05/15/1985    Years since quitting: 33.5  . Smokeless tobacco: Never Used  Substance and Sexual Activity  . Alcohol use: No    Comment: RECOVERING ALCOHOLIC--  QUIT [id number]  . Drug use: No  . Sexual activity: Never  Lifestyle  . Physical activity    Days per week: Not on file    Minutes per session: Not on file  . Stress: Not on file  Relationships  . Social Herbalist on phone: Not on file    Gets together: Not on file    Attends religious service: Not on file    Active member of club or organization: Not on file    Attends meetings of clubs or organizations: Not on file    Relationship status: Not on file  Other Topics Concern  . 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 [place] Nail [Apremilast] Anaphylaxis    Suicidal ideation  . Penicillins Anaphylaxis    Has patient had a PCN reaction causing immediate rash, facial/tongue/throat swelling, SOB or lightheadedness with hypotension: Yes Has patient had a PCN reaction causing severe rash involving mucus membranes or skin necrosis: Yes Has patient had a PCN reaction that required hospitalization: No Has patient had a PCN reaction occurring within the last 10 year No If all of the above answers are "NO", then may proceed with Cephalosporin use.   . Erythromycin Other (See Comments)    SEVERE STOMACH CRAMPS  . Morphine And Related  Nausea And Vomiting  . Nsaids Other (See Comments)    SEVERE STOMACH CRAMPS, MOUTH SORES **Able to tolerate Tylenol  . Remicade [Infliximab] Other (See Comments)    Shut down immune system-BP high  . Tolmetin     SEVERE STOMACH CRAMPS  . Nickel Rash    Including snaps on hospital gowns     Metabolic Disorder Labs: Lab Results  Component Value Date   HGBA1C 5.3 11/05/2016   No results found for:  PROLACTIN Lab Results  Component Value Date   CHOL 170 12/29/2017   TRIG 89.0 12/29/2017   HDL 59.60 12/29/2017   CHOLHDL 3 12/29/2017   VLDL 17.8 12/29/2017   LDLCALC 92 12/29/2017   LDLCALC 144 (H) 11/05/2016     Current Medications: Current Outpatient Medications  Medication Sig Dispense Refill  . atorvastatin (LIPITOR) 20 MG tablet TAKE 1 TABLET(20 MG) BY MOUTH DAILY 90 tablet 3  . betamethasone dipropionate 0.05 % lotion APPLY EXTERNALLY TO THE AFFECTED AREA TWICE DAILY 60 mL 2  . cetirizine (ZYRTEC) 10 MG tablet Take 10 mg by mouth daily.    Marland Kitchen escitalopram (LEXAPRO) 10 MG tablet Take 1 tablet (10 mg total) by mouth daily. 90 tablet 0  . HYDROCODONE-APAP-DIETARY PROD PO hydroco/apap tab 5-325mg     . hydrocortisone cream 1 % Apply 1 application topically daily as needed for itching.    Marland Kitchen L-Methylfolate-B12-B6-B2 (CEREFOLIN) 07-17-48-5 MG TABS Take one caplet by mouth daily. 90 tablet 3  . losartan (COZAAR) 50 MG tablet TAKE 1 TABLET(50 MG) BY MOUTH DAILY 90 tablet 0  . methotrexate (RHEUMATREX) 2.5 MG tablet 6 TABLETS ONCE A WEEK ORALLY 28 DAYS    . Na Sulfate-K Sulfate-Mg Sulf 17.5-3.13-1.6 GM/177ML SOLN Suprep (no substitutions)-TAKE AS DIRECTED. 354 mL 0  . nystatin (MYCOSTATIN/NYSTOP) powder Apply topically 2 (two) times daily. 60 g 3  . omeprazole (PRILOSEC) 20 MG capsule Take 1 capsule (20 mg total) by mouth daily. Please schedule an appointment for further refills: [phone] 90 capsule 0  . psyllium (REGULOID) 0.52 g capsule Take 0.52 g by mouth daily.    Marland Kitchen  sulfamethoxazole-trimethoprim (BACTRIM,SEPTRA) 400-80 MG tablet Take 1 tablet by mouth 2 (two) times daily. For urethritis.    . SUMAtriptan (IMITREX) 100 MG tablet Take 1 tablet (100 mg total) by mouth every 2 (two) hours as needed for migraine. May repeat in 2 hours if headache persists or recurs. 10 tablet 0  . traZODone (DESYREL) 50 MG tablet TAKE 1 TABLET BY MOUTH DAILY AT BEDTIME 90 tablet 1  . triamcinolone cream (KENALOG) 0.1 % Apply 1 application topically 2 (two) times daily as needed. 30 g 1   No current facility-administered medications for this visit.      Psychiatric Specialty Exam: Review of Systems  Cardiovascular: Negative for chest pain.  Skin: Negative for rash.  Psychiatric/Behavioral: Negative for depression and suicidal ideas.    There were no vitals taken for this visit.There is no height or weight on file to calculate BMI.  General Appearance:   Eye Contact:    Speech:  Normal Rate  Volume:  Decreased  Mood: fair  Affect:  Congruent  Thought Process:  Goal Directed  Orientation:  Full (Time, Place, and Person)  Thought Content:  Rumination  Suicidal Thoughts:  No  Homicidal Thoughts:  No  Memory:  Immediate;   Fair Recent;   Fair  Judgement:  Fair  Insight:  Fair  Psychomotor Activity:  Normal  Concentration:  Concentration: Fair and Attention Span: Fair  Recall:  [employer] of Knowledge:Good  Language: Good  Akathisia:  No  Handed:  Right  AIMS (if indicated):    Assets:  Desire for Improvement  ADL's:  Intact  Cognition: Impaired,  Mild  Sleep:  Variable to fair on meds    Treatment Plan Summary: Medication management and Plan as follows  1. MDD, mild to moderate:doing fair, continue lexapro. 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 The patient was provided an opportunity to ask questions and all were answered. The  patient agreed with the plan and demonstrated an understanding of the instructions.   The patient was advised to call back or seek an in-person evaluation if the symptoms worsen or if the condition fails to improve as anticipated.  Fu 4-5 m. Renewed meds [name], MD 10/15/20204:21 PM

## 2018-12-06 DIAGNOSIS — L4050 Arthropathic psoriasis, unspecified: Secondary | ICD-10-CM | POA: Diagnosis not present

## 2018-12-06 DIAGNOSIS — G5622 Lesion of ulnar nerve, left upper limb: Secondary | ICD-10-CM | POA: Diagnosis not present

## 2018-12-06 DIAGNOSIS — Z6832 Body mass index (BMI) 32.0-32.9, adult: Secondary | ICD-10-CM | POA: Diagnosis not present

## 2018-12-06 DIAGNOSIS — M150 Primary generalized (osteo)arthritis: Secondary | ICD-10-CM | POA: Diagnosis not present

## 2018-12-06 DIAGNOSIS — L401 Generalized pustular psoriasis: Secondary | ICD-10-CM | POA: Diagnosis not present

## 2018-12-06 DIAGNOSIS — M5030 Other cervical disc degeneration, unspecified cervical region: Secondary | ICD-10-CM | POA: Diagnosis not present

## 2018-12-06 DIAGNOSIS — M25511 Pain in right shoulder: Secondary | ICD-10-CM | POA: Diagnosis not present

## 2018-12-06 DIAGNOSIS — M5136 Other intervertebral disc degeneration, lumbar region: Secondary | ICD-10-CM | POA: Diagnosis not present

## 2018-12-06 DIAGNOSIS — E669 Obesity, unspecified: Secondary | ICD-10-CM | POA: Diagnosis not present

## 2018-12-09 ENCOUNTER — Ambulatory Visit: Payer: Medicare Other | Admitting: Gastroenterology

## 2019-01-11 ENCOUNTER — Other Ambulatory Visit: Payer: Self-pay

## 2019-01-18 ENCOUNTER — Other Ambulatory Visit: Payer: Self-pay

## 2019-01-18 ENCOUNTER — Encounter: Payer: Self-pay | Admitting: Neurology

## 2019-01-18 ENCOUNTER — Ambulatory Visit (INDEPENDENT_AMBULATORY_CARE_PROVIDER_SITE_OTHER): Payer: Medicare Other | Admitting: Neurology

## 2019-01-18 VITALS — BP 125/72 | HR 68 | Temp 97.4°F | Wt 198.0 lb

## 2019-01-18 DIAGNOSIS — G3184 Mild cognitive impairment, so stated: Secondary | ICD-10-CM

## 2019-01-18 DIAGNOSIS — G2581 Restless legs syndrome: Secondary | ICD-10-CM

## 2019-01-18 MED ORDER — CEREFOLIN 6-1-50-5 MG PO TABS: 1.0000 | ORAL_TABLET | ORAL | 3 refills | Status: AC

## 2019-01-18 MED ORDER — GABAPENTIN 100 MG PO CAPS: 100.0000 mg | ORAL_CAPSULE | Freq: Three times a day (TID) | ORAL | 2 refills | Status: DC | PRN

## 2019-01-18 NOTE — Progress Notes (Signed)
[facility] [address] [place]. [place] [address] [phone]       OFFICE FOLLOW UP VISIT NOTE  Ms. Noel Christmas Hundal Date of Birth:  05/25/1945 Medical Record Number:  [id number]   Referring MD:  [name], NP  Reason for Referral:  Memory loss HPI: Initial Consult 09/19/15 :  Ms Rolison is a 27 year Caucasian lady who for the last year and a half has been having mild memory and cognitive difficulties. She often misplaces objects but can remember later. She is also having word finding issues and trouble getting her words out. At times she has had trouble driving to places that she has driven down  To numerous times before and yet for a few seconds cannot remember where it done. She is also forgetting names of the patient's. 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 There is no history of strokes seizures significant head injury with loss of consciousness. There is no family history of Alzheimer's dementia. Patient has not had any brain imaging studies done on November done for reversible causes of cognitive impairment. She denies feeling depressed. Past medical history significant for irritable bowel syndrome and syncopal episodes. Update 12/09/2015 : She returns for follow-up after last visit 2 and half months ago. She continues from mild short-term memory difficulties but these appear to be stable and not getting worse. Patient did not fill the prescription for self: She forgot. She had MRI scan the brain done on 10/09/2015 which I personally reviewed and shows only mild changes of chronic microvascular ischemia without any structural lesions tumor infarcts. Lab work done on 8/ 3/17 showed normal vitamin B12, TSH and RPR. EEG done on 10/24/15 was normal. Patient was interested in participating in the [facility] early  dementia study but she does not have a reliable caregiver to help her participate. She states he is taking Celexa and her depression is well controlled. She has no new complaints today. Update 06/08/2016 : She returns for followup after last visit 6 months ago. She states she continues to have mi and cognitive difficulties which are however unchanged. She has trouble finding words occasionally and completing sentences. She has been taking Cerefolin nac and find it helps.  She does play solitaire but does not participate in any other cognitively challenging activities and states her job is quite taxing and stressful. She has no neurological complaints today.Marland Kitchen Update 08/30/2017 : She returns for follow-up after last visit more than a year ago. She continues to do well and states she has not had any worsening of her memory or cognitive difficulties. She continues to have short-term memory difficulties. 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 She also play solitaire a lot. 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 She is recovered from that and is now doing a lot of swimming to help her. She feels her depression has gotten worse and despite being on Wellbutrin. She plans to discuss with primary physician referral to a psychiatrist. Denies any other neurological complaints. She remains on; Cerefolin NAC which is tolerating well. Mini-Mental status exam score today was 30/30. Update 09/01/2018 : She returns for follow-up after last visit a year ago.  She states she is doing well.  She feels her memory difficulties in fact may be slightly better.  She remains on Cerefolin NAC which she takes regularly and needs a refill.  She still gets confused easily.  She often misses appointment.  In fact today she called stating that she would not be able to do  her video visit due to technical difficulties and was switched to in person visit.  Her son now lives with her and she enjoys having him.  Her daughter also spends a lot of time with her and comes over frequently.  She does play bridge.  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  Her blood pressures well controlled and today it is 122/66.  She is still bothered by bad hip and is planning to get a steroid injection in the next week. Update 01/18/2019 ; she returns for follow-up after last visit for a number of months ago.  She states she discontinued Cerefolin as she felt the cost was too much.  She feels it may have been helping her and short-term memory may have gotten worse.  She now wants to restart Cerefolin and is asking for a prescription.  She is mostly retired but does do some work mostly from home over the phone.  She continues to have mild short-term memory and cognitive difficulties but these do not appear to be progressing.  She has new complaint today of restless right foot.  She has a constant urge to move it and wiggles her foot constantly when she is sitting and not moving.  She is able to sleep well at night but does take trazodone which helps her sleep. ROS:   14 system review of systems is positive for  restless legs, right leg involuntary movement.,  Right hip pain.  Memory loss, restless right leg   walking difficulty,  and all other systems negative  PMH:  Past Medical History:  Diagnosis Date  . Allergic rhinitis   . Cancer ([place]) 2019   melanoma-leg   . Colitis   . Complication of anesthesia    vomit x1 while on Morphine per pt  . Depression   . Diverticulosis   . Frequency of urination   . GERD (gastroesophageal reflux disease)   . 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 History of hiatal hernia   . History of iron deficiency anemia   . History of migraine headaches   . Hyperlipidemia   . IBS (irritable bowel  syndrome)   . Memory loss   . Migraine headache    hx of migraines   . OA (osteoarthritis)    RIGHT SHOULDER  . PONV (postoperative nausea and vomiting)   . Recovering alcoholic ([place])    SINCE [id number]  . Right rotator cuff tear   . Unspecified essential hypertension     Social History:  Social History   Socioeconomic History  . Marital status: Widowed    Spouse name: Not on file  . Number of children: Not on file  . Years of education: Not on file  . Highest education level: Not on file  Occupational History  . Not on file  Social Needs  . Financial resource strain: Not on file  . Food insecurity    Worry: Not on file    Inability: Not on file  . Transportation needs    Medical: Not on file    Non-medical:  Not on file  Tobacco Use  . Smoking status: Former Smoker    Packs/day: 0.50    Years: 15.00    Pack years: 7.50    Types: Cigarettes    Quit date: 05/15/1985    Years since quitting: 33.7  . Smokeless tobacco: Never Used  Substance and Sexual Activity  . Alcohol use: No    Comment: RECOVERING ALCOHOLIC--  QUIT [id number]  . Drug use: No  . Sexual activity: Never  Lifestyle  . Physical activity    Days per week: Not on file    Minutes per session: Not on file  . Stress: Not on file  Relationships  . Social Herbalist on phone: Not on file    Gets together: Not on file    Attends religious service: Not on file    Active member of club or organization: Not on file    Attends meetings of clubs or organizations: Not on file    Relationship status: Not on file  . Intimate partner violence    Fear of current or ex partner: Not on file    Emotionally abused: Not on file    Physically abused: Not on file    Forced sexual activity: Not on file  Other Topics Concern  . 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 atorvastatin (LIPITOR) 20 MG tablet TAKE 1 TABLET(20 MG) BY MOUTH DAILY 90 tablet 3  . betamethasone dipropionate 0.05 % lotion APPLY EXTERNALLY TO THE AFFECTED AREA TWICE DAILY 60 mL 2  . cetirizine (ZYRTEC) 10 MG tablet Take 10 mg by mouth daily.    Marland Kitchen escitalopram (LEXAPRO) 10 MG tablet Take 1 tablet (10 mg total) by mouth daily. 90 tablet 0  . folic acid (FOLVITE) 1 MG tablet Take 1 mg by mouth daily.    Marland Kitchen HYDROcodone-acetaminophen (NORCO/VICODIN) 5-325 MG tablet Take 1 tablet by mouth every 6 (six) hours as needed.    . hydrocortisone cream 1 % Apply 1 application topically daily as needed for itching.    . losartan (COZAAR) 50 MG tablet TAKE 1 TABLET(50 MG) BY MOUTH DAILY 90 tablet 0  . methotrexate (RHEUMATREX) 2.5 MG tablet 6 TABLETS ONCE A WEEK ORALLY 28 DAYS    . nystatin (MYCOSTATIN/NYSTOP) powder Apply topically 2 (two) times daily. 60 g 3  . omeprazole (PRILOSEC) 20 MG capsule Take 1 capsule (20 mg total) by mouth daily. Please schedule an appointment for further refills: [phone] 90 capsule 0  . psyllium (REGULOID) 0.52 g capsule Take 0.52 g by mouth daily.    Marland Kitchen sulfamethoxazole-trimethoprim (BACTRIM,SEPTRA) 400-80 MG tablet Take 1 tablet by mouth daily. For urethritis.     . SUMAtriptan (IMITREX) 100 MG tablet Take 1 tablet (100 mg total) by mouth every 2 (two) hours as needed for migraine. May repeat in 2 hours if headache persists or recurs. 10 tablet 0  . traZODone (DESYREL) 50 MG tablet TAKE 1 TABLET BY MOUTH DAILY AT BEDTIME 90 tablet 1  . triamcinolone cream (KENALOG) 0.1 % Apply 1 application topically 2 (two) times daily as needed. 30 g 1   No current facility-administered medications on file prior to visit.  Allergies:   Allergies  Allergen Reactions  . [place] Nail [Apremilast] Anaphylaxis    Suicidal ideation  . Penicillins Anaphylaxis    Has patient had a  PCN reaction causing immediate rash, facial/tongue/throat swelling, SOB or lightheadedness with hypotension: Yes Has patient had a PCN reaction causing severe rash involving mucus membranes or skin necrosis: Yes Has patient had a PCN reaction that required hospitalization: No Has patient had a PCN reaction occurring within the last 10 year No If all of the above answers are "NO", then may proceed with Cephalosporin use.   . Erythromycin Other (See Comments)    SEVERE STOMACH CRAMPS  . Morphine And Related Nausea And Vomiting  . Nsaids Other (See Comments)    SEVERE STOMACH CRAMPS, MOUTH SORES **Able to tolerate Tylenol  . Remicade [Infliximab] Other (See Comments)    Shut down immune system-BP high  . Tolmetin     SEVERE STOMACH CRAMPS  . Nickel Rash    Including snaps on hospital gowns     Physical Exam General: well developed, well nourished elderly Caucasian lady, seated, in no evident distress Head: head normocephalic and atraumatic.   Neck: supple with no carotid or supraclavicular bruits Cardiovascular: regular rate and rhythm, no murmurs Musculoskeletal: no deformity Skin:  no rash/petichiae Vascular:  Normal pulses all extremities  Neurologic Exam Mental Status: Awake and fully alert. Oriented to place and time. Recent and remote memory intact. Attention span, concentration and fund of knowledge appropriate. Mood and affect appropriate. Mini-Mental status exam not done today.  Recall 3/3.. . Able to name 11 four legged animals. Clock drawing 4/4. Able to copy intersecting pentagons quite well. Cranial Nerves: Fundoscopic exam not done. Pupils equal, briskly reactive to light. Extraocular movements full without nystagmus. Visual fields full to confrontation. Hearing intact. Facial sensation intact. Face, tongue, palate moves normally and symmetrically.  Motor: Normal bulk and tone. Normal strength in all tested extremity muscles. Sensory.: intact to touch , pinprick ,  position and vibratory sensation.  Coordination: Rapid alternating movements normal in all extremities. Finger-to-nose and heel-to-shin performed accurately bilaterally. Gait and Station: Arises from chair without difficulty. Stance is normal. Gait demonstrates slight favoring of the right hip due to pain.  Unable to tandem walk without difficulty  reflexes: 1+ and symmetric. Toes downgoing.       ASSESSMENT: 20 year Caucasian lady with mild memory and cognitive difficulties likely due to mild cognitive impairment which appears stable.Marland Kitchen    PLAN: I had a long discussion with the patient regarding her mild memory loss and cognitive impairment which appears to be stable. Continue Cerefolin NAC daily as well as increase participation in cognitively challenging activities like solving crossword puzzles, word searches, sudoku and playing bridge.Start gabapentin 100 mg as needed for restless legs. She will return for follow-up in a year or call earlier if necessary. .. Greater than 50% time during this 25 minute  visit was spent on counseling and coordination of care about her memory loss and restless legs. and answering questions Antony Contras, MD  [facility] [address] [place], [place] [address]  Phone [phone] Fax [phone] Note: This document was prepared with digital dictation and possible smart phrase technology. Any transcriptional errors that result from this process are unintentional.

## 2019-01-18 NOTE — Patient Instructions (Addendum)
I had a long discussion with the patient regarding her mild memory loss and cognitive impairment which appears to be stable. Continue Cerefolin NAC daily as well as increase participation in cognitively challenging activities like solving crossword puzzles, word searches, sudoku and playing bridge.Start gabapentin 100 mg as needed for restless legs. She will return for follow-up in a year or call earlier if necessary.  Restless Legs Syndrome Restless legs syndrome is a condition that causes uncomfortable feelings or sensations in the legs, especially while sitting or lying down. The sensations usually cause an overwhelming urge to move the legs. The arms can also sometimes be affected. The condition can range from mild to severe. The symptoms often interfere with a person's ability to sleep. What are the causes? The cause of this condition is not known. What increases the risk? The following factors may make you more likely to develop this condition:  Being older than 50.  Pregnancy.  Being a woman. In general, the condition is more common in women than in men.  A family history of the condition.  Having iron deficiency.  Overuse of caffeine, nicotine, or alcohol.  Certain medical conditions, such as kidney disease, Parkinson's disease, or nerve damage.  Certain medicines, such as those for high blood pressure, nausea, colds, allergies, depression, and some heart conditions. What are the signs or symptoms? The main symptom of this condition is uncomfortable sensations in the legs, such as:  Pulling.  Tingling.  Prickling.  Throbbing.  Crawling.  Burning. Usually, the sensations:  Affect both sides of the body.  Are worse when you sit or lie down.  Are worse at night. These may wake you up or make it difficult to fall asleep.  Make you have a strong urge to move your legs.  Are temporarily relieved by moving your legs. The arms can also be affected, but this is rare.  People who have this condition often have tiredness during the day because of their lack of sleep at night. How is this diagnosed? This condition may be diagnosed based on:  Your symptoms.  Blood tests. In some cases, you may be monitored in a sleep lab by a specialist (a sleep study). This can detect any disruptions in your sleep. How is this treated? This condition is treated by managing the symptoms. This may include:  Lifestyle changes, such as exercising, using relaxation techniques, and avoiding caffeine, alcohol, or tobacco.  Medicines. Anti-seizure medicines may be tried first. Follow these instructions at home:     General instructions  Take over-the-counter and prescription medicines only as told by your health care provider.  Use methods to help relieve the uncomfortable sensations, such as: ? Massaging your legs. ? Walking or stretching. ? Taking a cold or hot bath.  Keep all follow-up visits as told by your health care provider. This is important. Lifestyle  Practice good sleep habits. For example, go to bed and get up at the same time every day. Most adults should get 7-9 hours of sleep each night.  Exercise regularly. Try to get at least 30 minutes of exercise most days of the week.  Practice ways of relaxing, such as yoga or meditation.  Avoid caffeine and alcohol.  Do not use any products that contain nicotine or tobacco, such as cigarettes and e-cigarettes. If you need help quitting, ask your health care provider. Contact a health care provider if:  Your symptoms get worse or they do not improve with treatment. Summary  Restless legs syndrome  is a condition that causes uncomfortable feelings or sensations in the legs, especially while sitting or lying down.  The symptoms often interfere with a person's ability to sleep.  This condition is treated by managing the symptoms. You may need to make lifestyle changes or take medicines. This information is  not intended to replace advice given to you by your health care provider. Make sure you discuss any questions you have with your health care provider. Document Released: 01/23/2002 Document Revised: 02/22/2017 Document Reviewed: 02/22/2017 Elsevier Patient Education  2020 [employer].

## 2019-01-25 ENCOUNTER — Encounter: Payer: Self-pay | Admitting: Gastroenterology

## 2019-01-25 ENCOUNTER — Ambulatory Visit (INDEPENDENT_AMBULATORY_CARE_PROVIDER_SITE_OTHER): Payer: Medicare Other | Admitting: Gastroenterology

## 2019-01-25 VITALS — BP 110/70 | HR 55 | Temp 97.4°F | Ht 66.5 in | Wt 197.0 lb

## 2019-01-25 DIAGNOSIS — Z860100 Personal history of colon polyps, unspecified: Secondary | ICD-10-CM

## 2019-01-25 DIAGNOSIS — K219 Gastro-esophageal reflux disease without esophagitis: Secondary | ICD-10-CM

## 2019-01-25 DIAGNOSIS — R1310 Dysphagia, unspecified: Secondary | ICD-10-CM

## 2019-01-25 MED ORDER — FAMOTIDINE 20 MG PO TABS: ORAL_TABLET | ORAL | 3 refills | Status: DC

## 2019-01-25 NOTE — Progress Notes (Signed)
HPI :  73 y/o female here for a follow up visit.   I have followed her in the past for surveillance colonoscopy for numerous colonic polyps.  She has had a history of suspected ischemic versus infectious colitis in 2017.  She has had issues with both fecal and urine incontinence for which she is required pelvic floor PT.  She is also had chronic reflux and dysphagia in the past.  Remotely she was taking Prilosec 40 mg once a day which controls her reflux symptoms.  She has been weaned down to 20 mg once a day and states it continues to work quite well for her.  She denies any breakthrough on this regimen and tolerates it pretty well.  She had an EGD with me in 2017 which showed a 4 cm hiatal hernia, no Barrett's esophagus.  She had dysphagia at the time and empiric dilation was performed which resolved her dysphagia for quite a long time.  She states she is starting having some recurrent solid food dysphagia periodically although is quite mild and not too bothersome for her at all.  She states she is "not ready for another dilation" but will contact me when symptoms bother her frequently enough to warrant this.  She does have a history of osteoporosis.  We discussed long-term risk benefits of chronic PPI use.  Of note she is lost about 30 pounds since of last seen her, primarily due to dieting and use of the weight watchers program  She states the pelvic floor PT did help her urine and fecal incontinence.  She is no longer having fecal leakage and is much better but she does have some urgency to get to the bathroom when she has the urge to urinate.  She states she is taking a fiber supplement and her bowels are generally very regular and not too bothersome.  Generally she is been feeling well otherwise without complaints.  Since her last visit with me she did have a colonoscopy for surveillance purposes in January of this year.  She had 5 small polyps removed, most adenomatous/sessile serrated.  She  had 10 adenomatous/sessile serrated polyps in 2018.  Procedure history: Colonoscopy 04/15/2016 - 10 small polyps - adenomatous / sessile serrated, diverticulosis, hemorrhoids, no colitis  EGD on 09/26/2015 - 4cm hiatal hernia, otherwise normal esophagus, empirically dilated to 81mm [place]. Mild gastritis noted  Colonoscopy 03/15/18 - The perianal and digital rectal examinations were normal. - A 4 to 5 mm polyp was found in the ascending colon. The polyp was flat. The polyp was removed with a cold snare. Resection and retrieval were complete. - Two sessile polyps were found in the transverse colon. The polyps were 3 to 4 mm in size. These polyps were removed with a cold snare. Resection and retrieval were complete. - A 3 mm polyp was found in the recto-sigmoid colon. The polyp was sessile. The polyp was removed with a cold snare. Resection and retrieval were complete. - A 3 mm polyp was found in the rectum. The polyp was sessile. The polyp was removed with a cold snare. Resection and retrieval were complete. - Multiple small-mouthed diverticula were found in the sigmoid colon, transverse colon, ascending colon and cecum, with tortousity in the sigmoid colon. - The exam was otherwise without abnormality. Retroflexed views of rectum not obtained due to small size of the rectum.  Some polyps adenomatous / sessile serrated - repeat in 3 years    Past Medical History:  Diagnosis Date  .  Allergic rhinitis   . Cancer ([place]) 2019   melanoma-leg   . Colitis   . Complication of anesthesia    vomit x1 while on Morphine per pt  . Depression   . Diverticulosis   . Frequency of urination   . GERD (gastroesophageal reflux disease)   . 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 History of hiatal hernia   . History of iron deficiency anemia   . History of migraine headaches   . Hyperlipidemia   . IBS (irritable bowel syndrome)   . Memory loss   . Migraine headache     hx of migraines   . OA (osteoarthritis)    RIGHT SHOULDER  . PONV (postoperative nausea and vomiting)   . 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 Right rotator cuff tear   . Unspecified essential hypertension      Past Surgical History:  Procedure Laterality Date  . BUNIONECTOMY/  HAMMERTOE CORRECTION  RIGHT FOOT  2011  . CATARACT EXTRACTION W/ INTRAOCULAR LENS  IMPLANT, BILATERAL    . COLONOSCOPY    . KNEE ARTHROSCOPY W/ MENISCECTOMY Bilateral X2  LEFT /    X1  RIGHT  . MOHS SURGERY Left 12/15/2017   Melanoma in situ - left calf - [facility]  . REPLACEMENT TOTAL KNEE Left 2006  . SHOULDER ARTHROSCOPY WITH SUBACROMIAL DECOMPRESSION, ROTATOR CUFF REPAIR AND BICEP TENDON REPAIR Right 05/23/2013   Procedure: RIGHT SHOULDER ARTHROSCOPY EXAM UNDER ANESTHESIA  WITH SUBACROMIAL DECOMPRESSION,DISTAL CLAVICLE RESECTION, SADLABRAL DEBRIDEMENT CHONDROPLASTY, BICEP TENOTOMY ;  Surgeon: Sydnee Cabal, MD;  Location: [facility];  Service: Orthopedics;  Laterality: Right;  . TONSILLECTOMY AND ADENOIDECTOMY  AGE 2  . TOTAL HIP ARTHROPLASTY Left 05/04/2017   Procedure: LEFT TOTAL HIP ARTHROPLASTY ANTERIOR APPROACH;  Surgeon: Paralee Cancel, MD;  Location: [facility];  Service: Orthopedics;  Laterality: Left;  . TOTAL KNEE ARTHROPLASTY Bilateral LEFT  1996/   RIGHT 2004  . 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 VAGINAL HYSTERECTOMY  1976   Family History  Problem Relation Age of Onset  . Heart disease Father 35  . Hypertension Father   . Melanoma Mother   . Cancer Sister        skin CA, 2 sisters  . Esophageal cancer Brother   . Dementia Paternal Grandfather   . Colon cancer Neg Hx   . Colon polyps Neg Hx   . Stomach cancer Neg Hx   . Rectal cancer Neg Hx    Social History   Tobacco Use  . Smoking status: Former Smoker    Packs/day: 0.50    Years: 15.00    Pack years: 7.50    Types: Cigarettes    Quit date: 05/15/1985    Years since quitting: 33.7  .  Smokeless tobacco: Never Used  Substance Use Topics  . Alcohol use: No    Comment: RECOVERING ALCOHOLIC--  QUIT [id number]  . Drug use: No   Current Outpatient Medications  Medication Sig Dispense Refill  . atorvastatin (LIPITOR) 20 MG tablet TAKE 1 TABLET(20 MG) BY MOUTH DAILY 90 tablet 3  . betamethasone dipropionate 0.05 % lotion APPLY EXTERNALLY TO THE AFFECTED AREA TWICE DAILY 60 mL 2  . escitalopram (LEXAPRO) 10 MG tablet Take 1 tablet (10 mg total) by mouth daily. 90 tablet 0  . folic acid (FOLVITE) 1 MG tablet Take 1 mg by mouth daily.    Marland Kitchen  gabapentin (NEURONTIN) 100 MG capsule Take 1 capsule (100 mg total) by mouth 3 (three) times daily as needed. 90 capsule 2  . HYDROcodone-acetaminophen (NORCO/VICODIN) 5-325 MG tablet Take 1 tablet by mouth every 6 (six) hours as needed.    . hydrocortisone cream 1 % Apply 1 application topically daily as needed for itching.    . losartan (COZAAR) 50 MG tablet TAKE 1 TABLET(50 MG) BY MOUTH DAILY 90 tablet 0  . omeprazole (PRILOSEC) 20 MG capsule Take 1 capsule (20 mg total) by mouth daily. Please schedule an appointment for further refills: [phone] 90 capsule 0  . sulfamethoxazole-trimethoprim (BACTRIM,SEPTRA) 400-80 MG tablet Take 1 tablet by mouth daily. For urethritis.     . SUMAtriptan (IMITREX) 100 MG tablet Take 1 tablet (100 mg total) by mouth every 2 (two) hours as needed for migraine. May repeat in 2 hours if headache persists or recurs. 10 tablet 0  . traZODone (DESYREL) 50 MG tablet TAKE 1 TABLET BY MOUTH DAILY AT BEDTIME 90 tablet 1  . methotrexate (RHEUMATREX) 2.5 MG tablet 6 TABLETS ONCE A WEEK ORALLY 28 DAYS    . nystatin (MYCOSTATIN/NYSTOP) powder Apply topically 2 (two) times daily. 60 g 3  . psyllium (REGULOID) 0.52 g capsule Take 0.52 g by mouth daily.     No current facility-administered medications for this visit.    Allergies  Allergen Reactions  . [place] Nail [Apremilast] Anaphylaxis    Suicidal ideation  . Penicillins  Anaphylaxis    Has patient had a PCN reaction causing immediate rash, facial/tongue/throat swelling, SOB or lightheadedness with hypotension: Yes Has patient had a PCN reaction causing severe rash involving mucus membranes or skin necrosis: Yes Has patient had a PCN reaction that required hospitalization: No Has patient had a PCN reaction occurring within the last 10 year No If all of the above answers are "NO", then may proceed with Cephalosporin use.   . Erythromycin Other (See Comments)    SEVERE STOMACH CRAMPS  . Morphine And Related Nausea And Vomiting  . Nsaids Other (See Comments)    SEVERE STOMACH CRAMPS, MOUTH SORES **Able to tolerate Tylenol  . Remicade [Infliximab] Other (See Comments)    Shut down immune system-BP high  . Tolmetin     SEVERE STOMACH CRAMPS  . Nickel Rash    Including snaps on hospital gowns      Review of Systems: All systems reviewed and negative except where noted in HPI.   Lab Results  Component Value Date   WBC 5.5 12/29/2017   HGB 12.9 12/29/2017   HCT 38.2 12/29/2017   MCV 97.6 12/29/2017   PLT 316.0 12/29/2017    Lab Results  Component Value Date   CREATININE 0.68 12/29/2017   BUN 14 12/29/2017   NA 140 12/29/2017   K 4.9 12/29/2017   CL 105 12/29/2017   CO2 28 12/29/2017    Lab Results  Component Value Date   ALT 21 12/29/2017   AST 21 12/29/2017   ALKPHOS 76 12/29/2017   BILITOT 0.6 12/29/2017     Physical Exam: BP 110/70   Pulse (!) 55   Temp (!) 97.4 F (36.3 C)   Ht 5' 6.5" (1.689 m)   Wt 197 lb (89.4 kg)   BMI 31.32 kg/m  Constitutional: Pleasant,well-developed, female in no acute distress. HEENT: Normocephalic and atraumatic. Conjunctivae are normal. No scleral icterus. Neck supple.  Cardiovascular: Normal rate, regular rhythm.  Pulmonary/chest: Effort normal and breath sounds normal. No wheezing, rales or rhonchi. Abdominal:  Soft, nondistended, nontender.There are no masses palpable. No hepatomegaly.  Extremities: no edema Lymphadenopathy: No cervical adenopathy noted. Neurological: Alert and oriented to person place and time. Skin: Skin is warm and dry. No rashes noted. Psychiatric: Normal mood and affect. Behavior is normal.   ASSESSMENT AND PLAN: 73 year old female here for reassessment following:  GERD / Dysphagia - longstanding symptoms, well controlled on lower dose of omeprazole at 20 mg once a day without breakthrough.  She is lost a considerable amount of weight with dieting since of last seen her and this is likely helping as well.  We discussed long-term risks and benefits of chronic PPI use.  Given she has osteoporosis would like to get her off chronic PPIs if possible to reduce her risk for bone fracture.  I think it is reasonable to transition to Pepcid 20 mg once to twice a day.  Initially she will take omeprazole 1 day and Pepcid the next for the next 2 weeks and if she does well with that we will transition to Pepcid every day, and can take it once to twice daily as needed.  Hopefully her symptoms can be controlled with Pepcid, but if not she can resume the omeprazole if needed.  Her dysphagia is mild and infrequent.  If this becomes more bothersome over time we can perform another EGD with dilation which helped her significantly a few years ago.  She can follow-up with me again in 1 year for this.  History of colon polyps - due for surveillance in 2023 if she wishes to continue to have surveillance exams at that time in her life.  Her fecal leakage issues are improved with pelvic floor PT and use of daily fiber supplement, no complaints at this time  West  Cellar, MD [facility]

## 2019-01-25 NOTE — Patient Instructions (Addendum)
If you are age 74 or older, your body mass index should be between 23-30. Your Body mass index is 31.32 kg/m. If this is out of the aforementioned range listed, please consider follow up with your Primary Care Provider.  If you are age 59 or younger, your body mass index should be between 19-25. Your Body mass index is 31.32 kg/m. If this is out of the aformentioned range listed, please consider follow up with your Primary Care Provider.   We have sent the following medications to your pharmacy for you to pick up at your convenience: Pepcid 20mg : Take once to twice daily  Alternate days of Pepcid and omeprazole.  If you are tolerating that after a week or two, you try taking Pepcid daily and discontinue omeprazole.  You can take Pepcid twice a day, if needed.  Thank you for entrusting me with your care and for choosing [facility], Dr. Trimont Cellar

## 2019-02-03 ENCOUNTER — Ambulatory Visit (INDEPENDENT_AMBULATORY_CARE_PROVIDER_SITE_OTHER): Payer: Medicare Other | Admitting: Adult Health

## 2019-02-03 ENCOUNTER — Encounter: Payer: Self-pay | Admitting: Adult Health

## 2019-02-03 ENCOUNTER — Other Ambulatory Visit: Payer: Self-pay

## 2019-02-03 VITALS — BP 144/84 | Temp 98.8°F | Ht 66.5 in | Wt 195.0 lb

## 2019-02-03 DIAGNOSIS — I10 Essential (primary) hypertension: Secondary | ICD-10-CM

## 2019-02-03 DIAGNOSIS — F3341 Major depressive disorder, recurrent, in partial remission: Secondary | ICD-10-CM

## 2019-02-03 DIAGNOSIS — E782 Mixed hyperlipidemia: Secondary | ICD-10-CM

## 2019-02-03 DIAGNOSIS — C4372 Malignant melanoma of left lower limb, including hip: Secondary | ICD-10-CM

## 2019-02-03 DIAGNOSIS — G3184 Mild cognitive impairment, so stated: Secondary | ICD-10-CM

## 2019-02-03 DIAGNOSIS — G4700 Insomnia, unspecified: Secondary | ICD-10-CM

## 2019-02-03 DIAGNOSIS — Z760 Encounter for issue of repeat prescription: Secondary | ICD-10-CM

## 2019-02-03 LAB — CBC WITH DIFFERENTIAL/PLATELET
Basophils Absolute: 0.1 10*3/uL (ref 0.0–0.1)
Basophils Relative: 1.5 % (ref 0.0–3.0)
Eosinophils Absolute: 0.1 10*3/uL (ref 0.0–0.7)
Eosinophils Relative: 2 % (ref 0.0–5.0)
HCT: 36.8 % (ref 36.0–46.0)
Hemoglobin: 12.5 g/dL (ref 12.0–15.0)
Lymphocytes Relative: 30.1 % (ref 12.0–46.0)
Lymphs Abs: 1.4 10*3/uL (ref 0.7–4.0)
MCHC: 34 g/dL (ref 30.0–36.0)
MCV: 99.2 fl (ref 78.0–100.0)
Monocytes Absolute: 0.4 10*3/uL (ref 0.1–1.0)
Monocytes Relative: 8.9 % (ref 3.0–12.0)
Neutro Abs: 2.6 10*3/uL (ref 1.4–7.7)
Neutrophils Relative %: 57.5 % (ref 43.0–77.0)
Platelets: 261 10*3/uL (ref 150.0–400.0)
RBC: 3.71 Mil/uL — ABNORMAL LOW (ref 3.87–5.11)
RDW: 12.5 % (ref 11.5–15.5)
WBC: 4.5 10*3/uL (ref 4.0–10.5)

## 2019-02-03 LAB — COMPREHENSIVE METABOLIC PANEL
ALT: 15 U/L (ref 0–35)
AST: 16 U/L (ref 0–37)
Albumin: 4.4 g/dL (ref 3.5–5.2)
Alkaline Phosphatase: 64 U/L (ref 39–117)
BUN: 12 mg/dL (ref 6–23)
CO2: 29 mEq/L (ref 19–32)
Calcium: 9.5 mg/dL (ref 8.4–10.5)
Chloride: 104 mEq/L (ref 96–112)
Creatinine, Ser: 0.57 mg/dL (ref 0.40–1.20)
GFR: 103.57 mL/min (ref >60.00)
Glucose, Bld: 91 mg/dL (ref 70–99)
Potassium: 3.9 mEq/L (ref 3.5–5.1)
Sodium: 140 mEq/L (ref 135–145)
Total Bilirubin: 0.8 mg/dL (ref 0.2–1.2)
Total Protein: 6.8 g/dL (ref 6.0–8.3)

## 2019-02-03 LAB — LIPID PANEL
Cholesterol: 165 mg/dL (ref 0–200)
HDL: 46.4 mg/dL (ref >39.00)
LDL Cholesterol: 100 mg/dL — ABNORMAL HIGH (ref 0–99)
NonHDL: 118.25
Total CHOL/HDL Ratio: 4
Triglycerides: 89 mg/dL (ref 0.0–149.0)
VLDL: 17.8 mg/dL (ref 0.0–40.0)

## 2019-02-03 LAB — TSH: TSH: 2.26 u[IU]/mL (ref 0.35–4.50)

## 2019-02-03 MED ORDER — TRAZODONE HCL 50 MG PO TABS: 50.0000 mg | ORAL_TABLET | Freq: Every day | ORAL | 1 refills | Status: DC

## 2019-02-03 MED ORDER — LOSARTAN POTASSIUM 50 MG PO TABS: ORAL_TABLET | ORAL | 3 refills | Status: DC

## 2019-02-03 MED ORDER — ATORVASTATIN CALCIUM 20 MG PO TABS: 20.0000 mg | ORAL_TABLET | Freq: Every day | ORAL | 3 refills | Status: DC

## 2019-02-03 MED ORDER — BETAMETHASONE DIPROPIONATE 0.05 % EX LOTN: TOPICAL_LOTION | Freq: Every day | CUTANEOUS | 2 refills | Status: DC

## 2019-02-03 NOTE — Progress Notes (Signed)
Subjective:    Patient ID: Tonya Bartlett, female    DOB: [birth year], 73 y.o.   MRN: [id number]  HPI Patient presents for yearly preventative medicine examination. She is a pleasant 73 year old female who  has a past medical history of Allergic rhinitis, Cancer ([place]) ([id number]), Colitis, Complication of anesthesia, Depression, Diverticulosis, Frequency of urination, GERD (gastroesophageal reflux disease), History of DVT of lower extremity, History of hiatal hernia, History of iron deficiency anemia, History of migraine headaches, Hyperlipidemia, IBS (irritable bowel syndrome), Memory loss, Migraine headache, OA (osteoarthritis), PONV (postoperative nausea and vomiting), Recovering alcoholic ([place]), Right rotator cuff tear, and Unspecified essential hypertension.  HTN - controlled with Cozaar 50 mg. She denies dizziness, lightheadedness, blurred vision, headaches, or syncope BP Readings from Last 3 Encounters:  02/03/19 (!) 144/84  01/25/19 110/70  01/18/19 125/72   Hyperlipidemia - currently prescribed lipitor 20 mg. She denies myalgias or fatigue    H/O Melanoma -diagnosed in October 2019.  She is followed by dermatology on a routine basis  Mild cognitive impairment-followed by neurology.  Last visit was January 18, 2019.  She was restarted on Cerefolin.  Her mild memory loss and cognitive impairment appear to be stable.  Was started on gabapentin 100 mg at this time for restless legs.  Anxiety/Depression - is seen by [facility]. Currently prescribed Lexapro 10 mg. Feels controlled on this medication.   Insomnia - takes Trazodone PRN   All immunizations and health maintenance protocols were reviewed with the patient and needed orders were placed. She is up to date on routine vaccinations   Appropriate screening laboratory values were ordered for the patient including screening of hyperlipidemia, renal function and hepatic function.  Medication reconciliation,  past medical history, social history,  problem list and allergies were reviewed in detail with the patient  Goals were established with regard to weight loss, exercise, and  diet in compliance with medications. She has been exercising and eating a heart healthy diet. Her top weight about a year ago was 233 labs. Since losing greater than 30 pounds she feels a lot better overall.   Wt Readings from Last 3 Encounters:  02/03/19 195 lb (88.5 kg)  01/25/19 197 lb (89.4 kg)  01/18/19 198 lb (89.8 kg)   End of life planning was discussed. She has an advanced directive and living will.   She was up to date on routine screening colonoscopy, mammogram and dexa screen.   Review of Systems  Constitutional: Negative.   HENT: Negative.   Eyes: Negative.   Respiratory: Negative.   Cardiovascular: Negative.   Gastrointestinal: Negative.   Endocrine: Negative.   Genitourinary: Negative.   Musculoskeletal: Negative.   Skin: Negative.   Allergic/Immunologic: Negative.   Neurological: Negative.   Hematological: Negative.   Psychiatric/Behavioral: Negative.    Past Medical History:  Diagnosis Date  . Allergic rhinitis   . Cancer ([place]) 2019   melanoma-leg   . Colitis   . Complication of anesthesia    vomit x1 while on Morphine per pt  . Depression   . Diverticulosis   . Frequency of urination   . GERD (gastroesophageal reflux disease)   . 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 History of hiatal hernia   . History of iron deficiency anemia   . History of migraine headaches   . Hyperlipidemia   . IBS (irritable bowel syndrome)   . Memory loss   . Migraine headache  hx of migraines   . OA (osteoarthritis)    RIGHT SHOULDER  . PONV (postoperative nausea and vomiting)   . Recovering alcoholic ([place])    SINCE [id number]  . Right rotator cuff tear   . Unspecified essential hypertension     Social History   Socioeconomic History  . Marital status: Widowed    Spouse name: Not on file  .  Number of children: Not on file  . Years of education: Not on file  . Highest education level: Not on file  Occupational History  . Not on file  Tobacco Use  . Smoking status: Former Smoker    Packs/day: 0.50    Years: 15.00    Pack years: 7.50    Types: Cigarettes    Quit date: 05/15/1985    Years since quitting: 33.7  . Smokeless tobacco: Never Used  Substance and Sexual Activity  . Alcohol use: No    Comment: RECOVERING ALCOHOLIC--  QUIT [id number]  . Drug use: No  . Sexual activity: Never  Other Topics Concern  . 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 Difficulty of Paying Living Expenses: Not on file  Food Insecurity:   . Worried About Charity fundraiser in the Last Year: Not on file  . Ran Out of Food in the Last Year: Not on file  Transportation Needs:   . Lack of Transportation (Medical): Not on file  . Lack of Transportation (Non-Medical): Not on file  Physical Activity:   . Days of Exercise per Week: Not on file  . Minutes of Exercise per Session: Not on file  Stress:   . Feeling of Stress : Not on file  Social Connections:   . Frequency of Communication with Friends and Family: Not on file  . Frequency of Social Gatherings with Friends and Family: Not on file  . Attends Religious Services: Not on file  . Active Member of Clubs or Organizations: Not on file  . Attends Archivist Meetings: Not on file  . Marital Status: Not on file  Intimate Partner Violence:   . Fear of Current or Ex-Partner: Not on file  . Emotionally Abused: Not on file  . Physically Abused: Not on file  . Sexually Abused: Not on file    Past Surgical History:  Procedure Laterality Date  . BUNIONECTOMY/  HAMMERTOE CORRECTION  RIGHT FOOT  2011  . CATARACT EXTRACTION W/ INTRAOCULAR  LENS  IMPLANT, BILATERAL    . COLONOSCOPY    . KNEE ARTHROSCOPY W/ MENISCECTOMY Bilateral X2  LEFT /    X1  RIGHT  . MOHS SURGERY Left 12/15/2017   Melanoma in situ - left calf - [facility]  . REPLACEMENT TOTAL KNEE Left 2006  . SHOULDER ARTHROSCOPY WITH SUBACROMIAL DECOMPRESSION, ROTATOR CUFF REPAIR AND BICEP TENDON REPAIR Right 05/23/2013   Procedure: RIGHT SHOULDER ARTHROSCOPY EXAM UNDER ANESTHESIA  WITH SUBACROMIAL DECOMPRESSION,DISTAL CLAVICLE RESECTION, SADLABRAL DEBRIDEMENT CHONDROPLASTY, BICEP TENOTOMY ;  Surgeon: Sydnee Cabal, MD;  Location: [facility];  Service: Orthopedics;  Laterality: Right;  . TONSILLECTOMY AND ADENOIDECTOMY  AGE 40  . TOTAL HIP ARTHROPLASTY Left 05/04/2017   Procedure: LEFT TOTAL HIP ARTHROPLASTY ANTERIOR APPROACH;  Surgeon: Paralee Cancel, MD;  Location: [facility];  Service: Orthopedics;  Laterality: Left;  . TOTAL KNEE ARTHROPLASTY Bilateral LEFT  1996/   RIGHT 2004  . 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 VAGINAL HYSTERECTOMY  1976    Family History  Problem Relation Age of Onset  . Heart disease Father 76  . Hypertension Father   . Melanoma Mother   . Cancer Sister        skin CA, 2 sisters  . Esophageal cancer Brother   . Dementia Paternal Grandfather   . Colon cancer Neg Hx   . Colon polyps Neg Hx   . Stomach cancer Neg Hx   . Rectal cancer Neg Hx     Allergies  Allergen Reactions  . [place] Nail [Apremilast] Anaphylaxis    Suicidal ideation  . Penicillins Anaphylaxis    Has patient had a PCN reaction causing immediate rash, facial/tongue/throat swelling, SOB or lightheadedness with hypotension: Yes Has patient had a PCN reaction causing severe rash involving mucus membranes or skin necrosis: Yes Has patient had a PCN reaction that required hospitalization: No Has patient had a PCN reaction occurring within the last 10 year No If all of the above answers are "NO", then may proceed with Cephalosporin use.   . Erythromycin  Other (See Comments)    SEVERE STOMACH CRAMPS  . Morphine And Related Nausea And Vomiting  . Nsaids Other (See Comments)    SEVERE STOMACH CRAMPS, MOUTH SORES **Able to tolerate Tylenol  . Remicade [Infliximab] Other (See Comments)    Shut down immune system-BP high  . Tolmetin     SEVERE STOMACH CRAMPS  . Nickel Rash    Including snaps on hospital gowns     Current Outpatient Medications on File Prior to Visit  Medication Sig Dispense Refill  . atorvastatin (LIPITOR) 20 MG tablet TAKE 1 TABLET(20 MG) BY MOUTH DAILY 90 tablet 3  . betamethasone dipropionate 0.05 % lotion APPLY EXTERNALLY TO THE AFFECTED AREA TWICE DAILY 60 mL 2  . escitalopram (LEXAPRO) 10 MG tablet Take 1 tablet (10 mg total) by mouth daily. 90 tablet 0  . famotidine (PEPCID) 20 MG tablet Take one tablet (20mg ) once to twice a day as needed 99991111 tablet 3  . folic acid (FOLVITE) 1 MG tablet Take 1 mg by mouth daily.    Marland Kitchen gabapentin (NEURONTIN) 100 MG capsule Take 1 capsule (100 mg total) by mouth 3 (three) times daily as needed. 90 capsule 2  . HYDROcodone-acetaminophen (NORCO/VICODIN) 5-325 MG tablet Take 1 tablet by mouth every 6 (six) hours as needed.    . hydrocortisone cream 1 % Apply 1 application topically daily as needed for itching.    . losartan (COZAAR) 50 MG tablet TAKE 1 TABLET(50 MG) BY MOUTH DAILY 90 tablet 0  . omeprazole (PRILOSEC) 20 MG capsule Take 1 capsule (20 mg total) by mouth daily. Please schedule an appointment for further refills: [phone] 90 capsule 0  . sulfamethoxazole-trimethoprim (BACTRIM,SEPTRA) 400-80 MG tablet Take 1 tablet by mouth daily. For urethritis.     . SUMAtriptan (IMITREX) 100 MG tablet Take 1 tablet (100 mg total) by mouth every 2 (two) hours as needed for migraine. May repeat in 2 hours if headache persists or recurs. 10 tablet 0  . traZODone (DESYREL) 50 MG tablet TAKE 1 TABLET BY MOUTH DAILY AT BEDTIME 90 tablet 1   No current facility-administered medications on file  prior to visit.    BP (!) 144/84   Temp 98.8 F (37.1  C) (Temporal)   Ht 5' 6.5" (1.689 m)   Wt 195 lb (88.5 kg)   BMI 31.00 kg/m       Objective:   Physical Exam Vitals and nursing note reviewed.  Constitutional:      General: She is not in acute distress.    Appearance: Normal appearance. She is well-developed.  HENT:     Head: Normocephalic and atraumatic.     Right Ear: Tympanic membrane, ear canal and external ear normal. There is no impacted cerumen.     Left Ear: Tympanic membrane, ear canal and external ear normal. There is no impacted cerumen.     Nose: Nose normal. No congestion or rhinorrhea.     Mouth/Throat:     Mouth: Mucous membranes are moist.     Pharynx: Oropharynx is clear. No oropharyngeal exudate.  Eyes:     General:        Right eye: No discharge.        Left eye: No discharge.     Conjunctiva/sclera: Conjunctivae normal.  Neck:     Thyroid: No thyromegaly.     Trachea: No tracheal deviation.  Cardiovascular:     Rate and Rhythm: Normal rate and regular rhythm.     Pulses: Normal pulses.     Heart sounds: Normal heart sounds. No murmur. No friction rub. No gallop.   Pulmonary:     Effort: Pulmonary effort is normal. No respiratory distress.     Breath sounds: Normal breath sounds. No stridor. No wheezing, rhonchi or rales.  Chest:     Chest wall: No tenderness.  Abdominal:     General: Bowel sounds are normal. There is no distension.     Palpations: Abdomen is soft. There is no mass.     Tenderness: There is no abdominal tenderness. There is no right CVA tenderness, left CVA tenderness, guarding or rebound.     Hernia: No hernia is present.  Musculoskeletal:        General: No swelling, tenderness, deformity or signs of injury. Normal range of motion.     Cervical back: Normal range of motion and neck supple.     Right lower leg: No edema.     Left lower leg: No edema.  Lymphadenopathy:     Cervical: No cervical adenopathy.  Skin:     General: Skin is warm and dry.     Coloration: Skin is not jaundiced or pale.     Findings: No bruising, erythema, lesion or rash.  Neurological:     General: No focal deficit present.     Mental Status: She is alert and oriented to person, place, and time. Mental status is at baseline.     Cranial Nerves: No cranial nerve deficit.     Sensory: No sensory deficit.     Motor: No weakness.     Coordination: Coordination normal.     Gait: Gait normal.     Deep Tendon Reflexes: Reflexes normal.  Psychiatric:        Mood and Affect: Mood normal.        Behavior: Behavior normal.        Thought Content: Thought content normal.        Judgment: Judgment normal.        Assessment & Plan:  1. Essential hypertension - No change in medications.  - Continue with healthy lifestyle.  - CBC with Differential/Platelet - CMP - Lipid panel - TSH - losartan (COZAAR) 50 MG tablet; TAKE  1 TABLET(50 MG) BY MOUTH DAILY  Dispense: 90 tablet; Refill: 3  2. Mixed hyperlipidemia - Continue with lipitor  - CBC with Differential/Platelet - CMP - Lipid panel - TSH - atorvastatin (LIPITOR) 20 MG tablet; Take 1 tablet (20 mg total) by mouth daily at 6 PM.  Dispense: 90 tablet; Refill: 3  3. Recurrent major depressive disorder, in partial remission ([place]) - Continue with Lexapro. Follow up with [facility] as needed  4. 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 MCI (mild cognitive impairment) - Continue with Neurology as directed   6. Insomnia, unspecified type  - traZODone (DESYREL) 50 MG tablet; Take 1 tablet (50 mg total) by mouth at bedtime.  Dispense: 90 tablet; Refill: 1  7. Medication refill  - betamethasone dipropionate 0.05 % lotion; Apply topically daily.  Dispense: 60 mL; Refill: 2   [name], NP

## 2019-02-06 DIAGNOSIS — Z03818 Encounter for observation for suspected exposure to other biological agents ruled out: Secondary | ICD-10-CM | POA: Diagnosis not present

## 2019-03-03 DIAGNOSIS — M5136 Other intervertebral disc degeneration, lumbar region: Secondary | ICD-10-CM | POA: Diagnosis not present

## 2019-03-03 DIAGNOSIS — M150 Primary generalized (osteo)arthritis: Secondary | ICD-10-CM | POA: Diagnosis not present

## 2019-03-03 DIAGNOSIS — G5622 Lesion of ulnar nerve, left upper limb: Secondary | ICD-10-CM | POA: Diagnosis not present

## 2019-03-03 DIAGNOSIS — M5030 Other cervical disc degeneration, unspecified cervical region: Secondary | ICD-10-CM | POA: Diagnosis not present

## 2019-03-03 DIAGNOSIS — L4050 Arthropathic psoriasis, unspecified: Secondary | ICD-10-CM | POA: Diagnosis not present

## 2019-03-03 DIAGNOSIS — M25511 Pain in right shoulder: Secondary | ICD-10-CM | POA: Diagnosis not present

## 2019-03-03 DIAGNOSIS — L401 Generalized pustular psoriasis: Secondary | ICD-10-CM | POA: Diagnosis not present

## 2019-03-07 ENCOUNTER — Other Ambulatory Visit: Payer: Self-pay | Admitting: Adult Health

## 2019-03-07 DIAGNOSIS — Z1231 Encounter for screening mammogram for malignant neoplasm of breast: Secondary | ICD-10-CM

## 2019-03-17 ENCOUNTER — Ambulatory Visit: Payer: Medicare Other

## 2019-03-22 ENCOUNTER — Ambulatory Visit: Payer: Medicare Other

## 2019-03-25 ENCOUNTER — Ambulatory Visit: Payer: Self-pay

## 2019-03-25 ENCOUNTER — Ambulatory Visit: Payer: Medicare Other | Attending: Internal Medicine

## 2019-03-25 DIAGNOSIS — Z23 Encounter for immunization: Secondary | ICD-10-CM | POA: Insufficient documentation

## 2019-03-25 NOTE — Progress Notes (Signed)
   Covid-19 Vaccination Clinic  Name:  Tonya Bartlett    MRN: [id number] DOB: 1945/06/10  03/25/2019  Tonya Bartlett was observed post Covid-19 immunization for 30 minutes based on pre-vaccination screening without incidence. She was provided with Vaccine Information Sheet and instruction to access the V-Safe system.   Tonya Bartlett was instructed to call 911 with any severe reactions post vaccine: Marland Kitchen Difficulty breathing  . Swelling of your face and throat  . A fast heartbeat  . A bad rash all over your body  . Dizziness and weakness    Immunizations Administered    Name Date Dose VIS Date Route   Pfizer COVID-19 Vaccine 03/25/2019  8:40 AM 0.3 mL 01/27/2019 Intramuscular   Manufacturer: [place]   Lot: [id number]   [place]: [id number]

## 2019-03-31 ENCOUNTER — Encounter: Payer: Self-pay | Admitting: Adult Health

## 2019-03-31 NOTE — Telephone Encounter (Signed)
Pt notified appt. Still shows for 3/2 at 530pm. Pt verbalized understanding.

## 2019-04-03 ENCOUNTER — Ambulatory Visit: Payer: Medicare Other

## 2019-04-07 ENCOUNTER — Other Ambulatory Visit (HOSPITAL_COMMUNITY): Payer: Self-pay

## 2019-04-07 MED ORDER — ESCITALOPRAM OXALATE 10 MG PO TABS: 10.0000 mg | ORAL_TABLET | Freq: Every day | ORAL | 0 refills | Status: DC

## 2019-04-11 ENCOUNTER — Ambulatory Visit
Admission: RE | Admit: 2019-04-11 | Discharge: 2019-04-11 | Disposition: A | Discharge status: Home / Self Care | Payer: Medicare Other | Source: Ambulatory Visit | Attending: Adult Health | Admitting: Adult Health

## 2019-04-11 ENCOUNTER — Other Ambulatory Visit: Payer: Self-pay

## 2019-04-11 DIAGNOSIS — Z1231 Encounter for screening mammogram for malignant neoplasm of breast: Secondary | ICD-10-CM

## 2019-04-18 ENCOUNTER — Ambulatory Visit: Payer: Medicare Other

## 2019-04-18 ENCOUNTER — Ambulatory Visit: Payer: Medicare Other | Attending: Internal Medicine

## 2019-04-18 DIAGNOSIS — Z23 Encounter for immunization: Secondary | ICD-10-CM

## 2019-04-18 NOTE — Progress Notes (Signed)
   Covid-19 Vaccination Clinic  Name:  Tonya Bartlett    MRN: [id number] DOB: 15-Apr-1945  04/18/2019  [name] was observed post Covid-19 immunization for 15 minutes without incident. She was provided with Vaccine Information Sheet and instruction to access the V-Safe system.   [name] was instructed to call 911 with any severe reactions post vaccine: Marland Kitchen Difficulty breathing  . Swelling of face and throat  . A fast heartbeat  . A bad rash all over body  . Dizziness and weakness   Immunizations Administered    Name Date Dose VIS Date Route   Pfizer COVID-19 Vaccine 04/18/2019 10:05 AM 0.3 mL 01/27/2019 Intramuscular   Manufacturer: [place]   Lot: [id number]   [place]: [id number]

## 2019-04-20 DIAGNOSIS — N952 Postmenopausal atrophic vaginitis: Secondary | ICD-10-CM | POA: Diagnosis not present

## 2019-04-20 DIAGNOSIS — Z6830 Body mass index (BMI) 30.0-30.9, adult: Secondary | ICD-10-CM | POA: Diagnosis not present

## 2019-04-20 DIAGNOSIS — Z124 Encounter for screening for malignant neoplasm of cervix: Secondary | ICD-10-CM | POA: Diagnosis not present

## 2019-04-22 ENCOUNTER — Other Ambulatory Visit: Payer: Self-pay | Admitting: Neurology

## 2019-04-24 ENCOUNTER — Other Ambulatory Visit: Payer: Self-pay

## 2019-04-24 MED ORDER — GABAPENTIN 100 MG PO CAPS: 100.0000 mg | ORAL_CAPSULE | Freq: Three times a day (TID) | ORAL | 6 refills | Status: DC | PRN

## 2019-04-26 ENCOUNTER — Ambulatory Visit (INDEPENDENT_AMBULATORY_CARE_PROVIDER_SITE_OTHER): Payer: Medicare Other | Admitting: Psychiatry

## 2019-04-26 ENCOUNTER — Encounter (HOSPITAL_COMMUNITY): Payer: Self-pay | Admitting: Psychiatry

## 2019-04-26 DIAGNOSIS — F331 Major depressive disorder, recurrent, moderate: Secondary | ICD-10-CM | POA: Diagnosis not present

## 2019-04-26 NOTE — Progress Notes (Signed)
[facility] Outpatient visit Tele psych  Patient Identification: Tonya Bartlett MRN:  [id number] Date of Evaluation:  04/26/2019 Referral Source: NP Chief Complaint:   Depression follow up  Visit Diagnosis:    ICD-10-CM   1. Major depressive disorder, recurrent episode, moderate (HCC)  F33.1     History of Present Illness: 74 years old currently widowed white female lives by herself has 3 grown kids and 6 grandkids referred initially by family medicine for management of depression   I connected with Oren Coxson Neyens on 04/26/19 at  1:30 PM EST by telephone and verified that I am speaking with the correct person using two identifiers.  I discussed the limitations of evaluation and management by telemedicine and the availability of in person appointments. The patient expressed understanding and agreed to proceed.  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  Hip replacement surgery.  Medical issues.    Past Psychiatric History: depression  Previous Psychotropic Medications: Yes   Substance Abuse History in the last 12 months:  No.  Consequences of Substance Abuse: NA  Past Medical History:  Past Medical History:  Diagnosis Date  . Allergic rhinitis   . Cancer ([place]) 2019   melanoma-leg   . Colitis   . Complication of anesthesia    vomit x1 while on Morphine per pt  . Depression   . Diverticulosis   . Frequency of urination   . GERD (gastroesophageal reflux disease)   . 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 History of hiatal hernia   . History of iron deficiency anemia   . History of migraine headaches   . Hyperlipidemia   . IBS (irritable bowel syndrome)   . Memory loss   . Migraine headache    hx of migraines   . OA  (osteoarthritis)    RIGHT SHOULDER  . PONV (postoperative nausea and vomiting)   . Recovering alcoholic ([place])    SINCE [id number]  . Right rotator cuff tear   . Unspecified essential hypertension     Past Surgical History:  Procedure Laterality Date  . BUNIONECTOMY/  HAMMERTOE CORRECTION  RIGHT FOOT  2011  . CATARACT EXTRACTION W/ INTRAOCULAR LENS  IMPLANT, BILATERAL    . COLONOSCOPY    . KNEE ARTHROSCOPY W/ MENISCECTOMY Bilateral X2  LEFT /    X1  RIGHT  . MOHS SURGERY Left 12/15/2017   Melanoma in situ - [facility]  . REPLACEMENT TOTAL KNEE Left 2006  . SHOULDER ARTHROSCOPY WITH SUBACROMIAL DECOMPRESSION, ROTATOR CUFF REPAIR AND BICEP TENDON REPAIR Right 05/23/2013   Procedure: RIGHT SHOULDER ARTHROSCOPY EXAM UNDER ANESTHESIA  WITH SUBACROMIAL DECOMPRESSION,DISTAL CLAVICLE RESECTION, SADLABRAL DEBRIDEMENT CHONDROPLASTY, BICEP TENOTOMY ;  Surgeon: Sydnee Cabal, MD;  Location: [facility];  Service: Orthopedics;  Laterality: Right;  . TONSILLECTOMY AND ADENOIDECTOMY  AGE 87  . TOTAL HIP ARTHROPLASTY Left 05/04/2017   Procedure: LEFT TOTAL HIP ARTHROPLASTY ANTERIOR APPROACH;  Surgeon: Paralee Cancel, MD;  Location: [facility];  Service: Orthopedics;  Laterality: Left;  . TOTAL KNEE ARTHROPLASTY Bilateral LEFT  1996/   RIGHT 2004  . 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 [place]  History: mom possible depression  Family History:  Family History  Problem Relation Age of Onset  . Heart disease Father 12  . Hypertension Father   . Melanoma Mother   . Cancer Sister        skin CA, 2 sisters  . Esophageal cancer Brother   . Dementia Paternal Grandfather   . Colon cancer Neg Hx   . Colon polyps Neg Hx   . Stomach cancer Neg Hx   . Rectal cancer Neg Hx     Social History:   Social History   Socioeconomic History  . Marital status: Widowed    Spouse name: Not on file  . Number of children: Not on file  .  Years of education: Not on file  . Highest education level: Not on file  Occupational History  . Not on file  Tobacco Use  . Smoking status: Former Smoker    Packs/day: 0.50    Years: 15.00    Pack years: 7.50    Types: Cigarettes    Quit date: 05/15/1985    Years since quitting: 33.9  . Smokeless tobacco: Never Used  Substance and Sexual Activity  . Alcohol use: No    Comment: RECOVERING ALCOHOLIC--  [id number] [id number]  . Drug use: No  . Sexual activity: Never  Other Topics Concern  . 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 Difficulty of Paying Living Expenses: Not on file  Food Insecurity:   . Worried About Charity fundraiser in the Last Year: Not on file  . Ran Out of Food in the Last Year: Not on file  Transportation Needs:   . Lack of Transportation (Medical): Not on file  . Lack of Transportation (Non-Medical): Not on file  Physical Activity:   . Days of Exercise per Week: Not on file  . Minutes of Exercise per Session: Not on file  Stress:   . Feeling of Stress : Not on file  Social Connections:   . Frequency of Communication with Friends and Family: Not on file  . Frequency of Social Gatherings with Friends and Family: Not on file  . Attends Religious Services: Not on file  . Active Member of Clubs or Organizations: Not on file  . Attends Archivist Meetings: Not on file  . Marital Status: Not on file     Allergies:   Allergies  Allergen Reactions  . [place] N[place] [Apremilast] Anaphylaxis    Suicidal ideation  . Penicillins Anaphylaxis    Has patient had a PCN reaction causing immediate rash, facial/tongue/throat swelling, SOB or lightheadedness with hypotension: Yes Has patient had a PCN reaction causing severe rash involving mucus membranes or skin necrosis:  Yes Has patient had a PCN reaction that required hospitalization: No Has patient had a PCN reaction occurring within the last 10 year No If all of the above answers are "NO", then may proceed with Cephalosporin use.   . Erythromycin Other (See Comments)    SEVERE STOMACH CRAMPS  . Morphine And Related Nausea And Vomiting  . Nsaids Other (See Comments)    SEVERE STOMACH CRAMPS, MOUTH SORES **Able to tolerate Tylenol  . Remicade [Infliximab] Other (See Comments)    Shut down immune system-BP high  .  Tolmetin     SEVERE STOMACH CRAMPS  . Nickel Rash    Including snaps on hospital gowns     Metabolic Disorder Labs: Lab Results  Component Value Date   HGBA1C 5.3 11/05/2016   No results found for: PROLACTIN Lab Results  Component Value Date   CHOL 165 02/03/2019   TRIG 89.0 02/03/2019   HDL 46.40 02/03/2019   CHOLHDL 4 02/03/2019   VLDL 17.8 02/03/2019   LDLCALC 100 (H) 02/03/2019   [place] 92 12/29/2017     Current Medications: Current Outpatient Medications  Medication Sig Dispense Refill  . atorvastatin (LIPITOR) 20 MG tablet Take 1 tablet (20 mg total) by mouth daily at 6 PM. 90 tablet 3  . betamethasone dipropionate 0.05 % lotion Apply topically daily. 60 mL 2  . escitalopram (LEXAPRO) 10 MG tablet Take 1 tablet (10 mg total) by mouth daily. 90 tablet 0  . famotidine (PEPCID) 20 MG tablet Take one tablet (20mg ) once to twice a day as needed [id number] tablet 3  . folic acid (FOLVITE) 1 MG tablet Take 1 mg by mouth daily.    Marland Kitchen gabapentin (NEURONTIN) 100 MG capsule Take 1 capsule (100 mg total) by mouth 3 (three) times daily as needed. 90 capsule 6  . HYDROcodone-acetaminophen (NORCO/VICODIN) 5-325 MG tablet Take 1 tablet by mouth every 6 (six) hours as needed.    . hydrocortisone cream 1 % Apply 1 application topically daily as needed for itching.    . losartan (COZAAR) 50 MG tablet TAKE 1 TABLET(50 MG) BY MOUTH DAILY 90 tablet 3  . omeprazole (PRILOSEC) 20 MG capsule Take 1  capsule (20 mg total) by mouth daily. Please schedule an appointment for further refills: [phone] 90 capsule 0  . sulfamethoxazole-trimethoprim (BACTRIM,SEPTRA) 400-80 MG tablet Take 1 tablet by mouth daily. For urethritis.     . SUMAtriptan (IMITREX) 100 MG tablet Take 1 tablet (100 mg total) by mouth every 2 (two) hours as needed for migraine. May repeat in 2 hours if headache persists or recurs. 10 tablet 0  . traZODone (DESYREL) 50 MG tablet Take 1 tablet (50 mg total) by mouth at bedtime. 90 tablet 1   No current facility-administered medications for this visit.     Psychiatric Specialty Exam: Review of Systems  Cardiovascular: Negative for chest pain.  Psychiatric/Behavioral: Negative for depression and suicidal ideas.    There were no vitals taken for this visit.There is no height or weight on file to calculate BMI.  General Appearance:   Eye Contact:    Speech:  Normal Rate  Volume:  Decreased  Mood: fair  Affect:  Congruent  Thought Process:  Goal Directed  Orientation:  Full (Time, Place, and Person)  Thought Content:  Rumination  Suicidal Thoughts:  No  Homicidal Thoughts:  No  Memory:  Immediate;   Fair Recent;   Fair  Judgement:  Fair  Insight:  Fair  Psychomotor Activity:  Normal  Concentration:  Concentration: Fair and Attention Span: Fair  Recall:  [employer] of Knowledge:Good  Language: Good  Akathisia:  No  Handed:  Right  AIMS (if indicated):    Assets:  Desire for Improvement  ADL's:  Intact  Cognition: Impaired,  Mild  Sleep:  Variable to fair on meds    Treatment Plan Summary: Medication management and Plan as follows  1. MDD, mild to moderate: doing fair, continue lexapro , call for refills  Alcohol recovery is in full remission for more then 30 years  discussed the assessment and treatment plan with the patient. The patient was provided an opportunity to ask questions and all were answered. The patient agreed with the plan and  demonstrated an understanding of the instructions.   The patient was advised to call back or seek an in-person evaluation if the symptoms worsen or if the condition fails to improve as anticipated. Non face to face time spent: 57min  Fu 5-39m [name], MD 3/10/20211:36 PM

## 2019-05-03 DIAGNOSIS — M25551 Pain in right hip: Secondary | ICD-10-CM | POA: Diagnosis not present

## 2019-06-01 DIAGNOSIS — G5622 Lesion of ulnar nerve, left upper limb: Secondary | ICD-10-CM | POA: Diagnosis not present

## 2019-06-01 DIAGNOSIS — M5030 Other cervical disc degeneration, unspecified cervical region: Secondary | ICD-10-CM | POA: Diagnosis not present

## 2019-06-01 DIAGNOSIS — M25511 Pain in right shoulder: Secondary | ICD-10-CM | POA: Diagnosis not present

## 2019-06-01 DIAGNOSIS — M150 Primary generalized (osteo)arthritis: Secondary | ICD-10-CM | POA: Diagnosis not present

## 2019-06-01 DIAGNOSIS — Z6830 Body mass index (BMI) 30.0-30.9, adult: Secondary | ICD-10-CM | POA: Diagnosis not present

## 2019-06-01 DIAGNOSIS — L401 Generalized pustular psoriasis: Secondary | ICD-10-CM | POA: Diagnosis not present

## 2019-06-01 DIAGNOSIS — L4050 Arthropathic psoriasis, unspecified: Secondary | ICD-10-CM | POA: Diagnosis not present

## 2019-06-01 DIAGNOSIS — M5136 Other intervertebral disc degeneration, lumbar region: Secondary | ICD-10-CM | POA: Diagnosis not present

## 2019-06-01 DIAGNOSIS — E669 Obesity, unspecified: Secondary | ICD-10-CM | POA: Diagnosis not present

## 2019-06-05 DIAGNOSIS — M1611 Unilateral primary osteoarthritis, right hip: Secondary | ICD-10-CM | POA: Diagnosis not present

## 2019-06-05 DIAGNOSIS — M25551 Pain in right hip: Secondary | ICD-10-CM | POA: Diagnosis not present

## 2019-06-11 ENCOUNTER — Ambulatory Visit (INDEPENDENT_AMBULATORY_CARE_PROVIDER_SITE_OTHER): Payer: Medicare Other

## 2019-06-11 ENCOUNTER — Encounter (HOSPITAL_COMMUNITY): Payer: Self-pay

## 2019-06-11 ENCOUNTER — Ambulatory Visit (HOSPITAL_COMMUNITY)
Admission: EM | Admit: 2019-06-11 | Discharge: 2019-06-11 | Disposition: A | Discharge status: Home / Self Care | Payer: Medicare Other | Attending: Family Medicine | Admitting: Family Medicine

## 2019-06-11 ENCOUNTER — Other Ambulatory Visit: Payer: Self-pay

## 2019-06-11 DIAGNOSIS — M25561 Pain in right knee: Secondary | ICD-10-CM

## 2019-06-11 DIAGNOSIS — Z96651 Presence of right artificial knee joint: Secondary | ICD-10-CM

## 2019-06-11 DIAGNOSIS — Z471 Aftercare following joint replacement surgery: Secondary | ICD-10-CM | POA: Diagnosis not present

## 2019-06-11 NOTE — ED Triage Notes (Signed)
Pt reports having right knee pain since yesterday after she hear a pop in her knee after she was trying to stand up.

## 2019-06-11 NOTE — ED Provider Notes (Signed)
Tonya Bartlett    CSN: [id number] Arrival date & time: 06/11/19  1045      History   Chief Complaint Chief Complaint  Patient presents with  . Knee Pain    HPI Tonya Bartlett is a 74 y.o. female.   HPI  Patient is a pleasant 74 year old woman who is here today for right knee pain.  She does have significant osteoarthritis.  She has had her left knee replaced twice, and her left hip replaced.  She has had her right knee replaced.  She is scheduled for her right hip replacement in June 2021. She states that yesterday she was sitting and as she tried to stand up she felt a pop and severe pain in her knee.  Since then she has pain with weightbearing and pain with movement.  She has lost range of motion in this knee and can only flex to 90 degrees and can extend fully.  Has not noticed any swelling.   Past Medical History:  Diagnosis Date  . Allergic rhinitis   . Cancer ([place]) 2019   melanoma-leg   . Colitis   . Complication of anesthesia    vomit x1 while on Morphine per pt  . Depression   . Diverticulosis   . Frequency of urination   . GERD (gastroesophageal reflux disease)   . 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 History of hiatal hernia   . History of iron deficiency anemia   . History of migraine headaches   . Hyperlipidemia   . IBS (irritable bowel syndrome)   . Memory loss   . Migraine headache    hx of migraines   . OA (osteoarthritis)    RIGHT SHOULDER  . PONV (postoperative nausea and vomiting)   . Recovering alcoholic ([place])    SINCE [id number]  . Right rotator cuff tear   . Unspecified essential hypertension     Patient Active Problem List   Diagnosis Date Noted  . Insomnia 02/03/2019  . Onychomycosis 11/28/2018  . Obese 05/05/2017  . 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 MCI (mild cognitive impairment) 09/19/2015  . Memory deficit 09/19/2015  . SIRS (systemic inflammatory response syndrome) ([place]) 10/29/2014  . 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 Depression 11/16/2013  . ADD (attention deficit disorder) 11/17/2011  . Essential hypertension, benign 10/15/2009  . SEBORRHEIC KERATOSIS, INFLAMED 10/17/2008  . CARPAL TUNNEL SYNDROME, LEFT 06/22/2008  . Allergic rhinitis 08/30/2007  . Hyperlipidemia 11/11/2006  . Migraine 11/11/2006  . Osteoarthritis 11/11/2006    Past Surgical History:  Procedure Laterality Date  . BUNIONECTOMY/  HAMMERTOE CORRECTION  RIGHT FOOT  2011  . CATARACT EXTRACTION W/ INTRAOCULAR LENS  IMPLANT, BILATERAL    . COLONOSCOPY    . KNEE ARTHROSCOPY W/ MENISCECTOMY Bilateral X2  LEFT /    X1  RIGHT  . MOHS SURGERY Left 12/15/2017   Melanoma in situ - [facility] - [facility]  . REPLACEMENT TOTAL KNEE Left 2006  . SHOULDER ARTHROSCOPY WITH SUBACROMIAL DECOMPRESSION, ROTATOR CUFF REPAIR AND BICEP TENDON REPAIR Right 05/23/2013   Procedure: RIGHT SHOULDER ARTHROSCOPY EXAM UNDER ANESTHESIA  WITH SUBACROMIAL DECOMPRESSION,DISTAL CLAVICLE RESECTION, SADLABRAL DEBRIDEMENT CHONDROPLASTY, BICEP TENOTOMY ;  Surgeon: Sydnee Cabal, MD;  Location: [facility];  Service: Orthopedics;  Laterality: Right;  . TONSILLECTOMY AND ADENOIDECTOMY  AGE 30  . TOTAL HIP ARTHROPLASTY Left 05/04/2017   Procedure: LEFT TOTAL HIP  ARTHROPLASTY ANTERIOR APPROACH;  Surgeon: Paralee Cancel, MD;  [facility];  Service: Orthopedics;  Laterality: Left;  . TOTAL KNEE ARTHROPLASTY Bilateral LEFT  1996/   RIGHT 2004  . 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 VAGINAL HYSTERECTOMY  1976    OB History   No obstetric history on file.      Home Medications    Prior to Admission medications   Medication Sig Start Date End Date Taking? Authorizing Provider  atorvastatin (LIPITOR) 20 MG tablet Take 1 tablet (20 mg total) by mouth daily at 6 PM. 02/03/19   Nafziger, Tommi Rumps, NP  betamethasone dipropionate 0.05 % lotion Apply topically daily. 02/03/19   Nafziger, Tommi Rumps, NP    escitalopram (LEXAPRO) 10 MG tablet Take 1 tablet (10 mg total) by mouth daily. 04/07/19 04/06/20  [name], MD  famotidine (PEPCID) 20 MG tablet Take one tablet (20mg ) once to twice a day as needed 01/25/19   Armbruster, [name], MD  folic acid (FOLVITE) 1 MG tablet Take 1 mg by mouth daily. 12/27/18   [provider]  gabapentin (NEURONTIN) 100 MG capsule Take 1 capsule (100 mg total) by mouth 3 (three) times daily as needed. 04/24/19 04/23/20  Garvin Fila, MD  HYDROcodone-acetaminophen (NORCO/VICODIN) 5-325 MG tablet Take 1 tablet by mouth every 6 (six) hours as needed. 12/28/18   [provider]  hydrocortisone cream 1 % Apply 1 application topically daily as needed for itching.    [provider]  losartan (COZAAR) 50 MG tablet TAKE 1 TABLET(50 MG) BY MOUTH DAILY 02/03/19   Nafziger, Tommi Rumps, NP  omeprazole (PRILOSEC) 20 MG capsule Take 1 capsule (20 mg total) by mouth daily. Please schedule an appointment for further refills: [phone] 11/08/18   Yetta Flock, MD  sulfamethoxazole-trimethoprim (BACTRIM,SEPTRA) 400-80 MG tablet Take 1 tablet by mouth daily. For urethritis.     [name], MD  SUMAtriptan (IMITREX) 100 MG tablet Take 1 tablet (100 mg total) by mouth every 2 (two) hours as needed for migraine. May repeat in 2 hours if headache persists or recurs. 11/16/18   Nafziger, Tommi Rumps, NP  traZODone (DESYREL) 50 MG tablet Take 1 tablet (50 mg total) by mouth at bedtime. 02/03/19   [name], NP    Family History Family History  Problem Relation Age of Onset  . Heart disease Father 61  . Hypertension Father   . Melanoma Mother   . Cancer Sister        skin CA, 2 sisters  . Esophageal cancer Brother   . Dementia Paternal Grandfather   . Colon cancer Neg Hx   . Colon polyps Neg Hx   . Stomach cancer Neg Hx   . Rectal cancer Neg Hx     Social History Social History   Tobacco Use  . Smoking status: Former Smoker    Packs/day: 0.50     Years: 15.00    Pack years: 7.50    Types: Cigarettes    Quit date: 05/15/1985    Years since quitting: 34.0  . Smokeless tobacco: Never Used  Substance Use Topics  . Alcohol use: No    Comment: RECOVERING ALCOHOLIC--  QUIT [id number]  . Drug use: No     Allergies   [place]la [apremilast], Penicillins, Erythromycin, Morphine and related, Nsaids, Remicade [infliximab], Tolmetin, and Nickel   Review of Systems Review of Systems  Musculoskeletal: Positive for arthralgias and gait problem.    Physical Exam Triage Vital Signs ED Triage  Vitals  Enc Vitals Group     BP 06/11/19 1122 (!) 152/69     Pulse Rate 06/11/19 1122 (!) 58     Resp 06/11/19 1122 18     Temp 06/11/19 1122 97.7 F (36.5 C)     Temp Source 06/11/19 1122 Oral     SpO2 06/11/19 1122 100 %     Weight --      Height --      Head Circumference --      Peak Flow --      Pain Score 06/11/19 1120 7     Pain Loc --      Pain Edu? --      Excl. in [place]? --    No data found.  Updated Vital Signs BP (!) 152/69 (BP Location: Right Arm)   Pulse (!) 58   Temp 97.7 F (36.5 C) (Oral)   Resp 18   SpO2 100%      Physical Exam Constitutional:      General: She is not in acute distress.    Appearance: She is well-developed.     Comments: No distress  HENT:     Head: Normocephalic and atraumatic.     Mouth/Throat:     Comments: Mask in place Eyes:     Conjunctiva/sclera: Conjunctivae normal.     Pupils: Pupils are equal, round, and reactive to light.  Cardiovascular:     Rate and Rhythm: Normal rate.  Pulmonary:     Effort: Pulmonary effort is normal. No respiratory distress.  Musculoskeletal:        General: Normal range of motion.     Cervical back: Normal range of motion.     Comments: Right knee with ROM 0 to 90, click with extension palp over patella,  No instability  Skin:    General: Skin is warm and dry.  Neurological:     General: No focal deficit present.     Mental Status: She is alert.      Gait: Gait abnormal.  Psychiatric:        Mood and Affect: Mood normal.        Behavior: Behavior normal.      UC Treatments / Results  Labs (all labs ordered are listed, but only abnormal results are displayed) Labs Reviewed - No data to display  EKG   Radiology DG Knee AP/LAT W/Sun[place] Right  Result Date: 06/11/2019 CLINICAL DATA:  Pop and pain, knee replacement EXAM: RIGHT KNEE 3 VIEWS COMPARISON:  None. FINDINGS: No fracture or dislocation of the right knee. Status post total arthroplasty. No evidence of perihardware fracture or lucency. No knee joint effusion. Soft tissues are unremarkable. IMPRESSION: No fracture or dislocation of the right knee status post total arthroplasty. No evidence of hardware complication. Electronically Signed   By: Eddie Candle M.D.   On: 06/11/2019 12:42    Procedures Procedures (including critical care time)  Medications Ordered in UC Medications - No data to display  Initial Impression / Assessment and Plan / UC Course  I have reviewed the triage vital signs and the nursing notes.  Pertinent labs & imaging results that were available during Tonya care of the patient were reviewed by me and considered in Tonya medical decision making (see chart for details).     Reviewed with patient that her x-ray is noncontributory.  She likely has soft tissue injury.  Could be cartilage or one of the tendons and ligaments.  She needs to follow-up with  orthopedic if she does not improve over the next few days.  Conservative cares reviewed Final Clinical Impressions(s) / UC Diagnoses   Final diagnoses:  Acute pain of right knee     Discharge Instructions     Limit walking while knee is painful Apply ice  for 20 minutes every 2-4 hours Take pain medicine as needed Apply brace for support Call your orthopedic if not improving in a few days      ED Prescriptions    None     PDMP not reviewed this encounter.   Raylene Everts, MD 06/11/19  1356

## 2019-06-11 NOTE — Discharge Instructions (Addendum)
Limit walking while knee is painful Apply ice  for 20 minutes every 2-4 hours Take pain medicine as needed Apply brace for support Call your orthopedic if not improving in a few days

## 2019-07-07 ENCOUNTER — Other Ambulatory Visit (HOSPITAL_COMMUNITY): Payer: Self-pay

## 2019-07-07 MED ORDER — ESCITALOPRAM OXALATE 10 MG PO TABS: 10.0000 mg | ORAL_TABLET | Freq: Every day | ORAL | 0 refills | Status: DC

## 2019-07-19 ENCOUNTER — Encounter (HOSPITAL_COMMUNITY): Payer: Self-pay

## 2019-07-19 DIAGNOSIS — Z85820 Personal history of malignant melanoma of skin: Secondary | ICD-10-CM | POA: Diagnosis not present

## 2019-07-19 DIAGNOSIS — H9012 Conductive hearing loss, unilateral, left ear, with unrestricted hearing on the contralateral side: Secondary | ICD-10-CM | POA: Diagnosis not present

## 2019-07-19 DIAGNOSIS — H9312 Tinnitus, left ear: Secondary | ICD-10-CM | POA: Diagnosis not present

## 2019-07-19 DIAGNOSIS — H8112 Benign paroxysmal vertigo, left ear: Secondary | ICD-10-CM | POA: Diagnosis not present

## 2019-07-19 DIAGNOSIS — R42 Dizziness and giddiness: Secondary | ICD-10-CM | POA: Diagnosis not present

## 2019-07-19 DIAGNOSIS — Z808 Family history of malignant neoplasm of other organs or systems: Secondary | ICD-10-CM | POA: Diagnosis not present

## 2019-07-19 DIAGNOSIS — H838X2 Other specified diseases of left inner ear: Secondary | ICD-10-CM | POA: Diagnosis not present

## 2019-07-19 NOTE — Patient Instructions (Addendum)
DUE TO COVID-19 ONLY ONE VISITOR ARE ALLOWED TO COME WITH YOU AND STAY IN THE WAITING ROOM ONLY DURING PRE OP AND PROCEDURE. THEN TWO VISITORS MAY VISIT WITH YOU IN YOUR PRIVATE ROOM DURING VISITING HOURS ONLY!!   COVID SWAB TESTING MUST BE COMPLETED ON:  Friday, July 28, 2019   At 12:15PM [address], [facility] (Must self quarantine after testing. Follow instructions on handout.)             Your procedure is scheduled on: Tuesday, August 01, 2019   Report to [facility] Main  Entrance    Report to admitting at 9:00 AM   Call this number if you have problems the morning of surgery [phone]   Do not eat food :After Midnight.   May have liquids until 8:30 AM day of surgery   CLEAR LIQUID DIET  Foods Allowed                                                                     Foods Excluded  Water, Black Coffee and tea, regular and decaf                             liquids that you cannot  Plain Jell-O in any flavor  (No red)                                           see through such as: Fruit ices (not with fruit pulp)                                     milk, soups, orange juice  Iced Popsicles (No red)                                    All solid food Carbonated beverages, regular and diet                                    Apple juices Sports drinks like Gatorade (No red) Lightly seasoned clear broth or consume(fat free) Sugar, honey syrup  Sample Menu Breakfast                                Lunch                                     Supper Cranberry juice                    Beef broth                            Chicken broth Jell-O  Grape juice                           Apple juice Coffee or tea                        Jell-O                                      Popsicle                                                Coffee or tea                        Coffee or  tea   Complete one Ensure drink the morning of surgery at 8:30 AM the day of surgery.   Oral Hygiene is also important to reduce your risk of infection.                                    Remember - BRUSH YOUR TEETH THE MORNING OF SURGERY WITH YOUR REGULAR TOOTHPASTE   Do NOT smoke after Midnight   Take these medicines the morning of surgery with A SIP OF WATER: Escitalopram, Famotidine, Gabapentin                               You may not have any metal on your body including hair pins, jewelry, and body piercings             Do not wear make-up, lotions, powders, perfumes/cologne, or deodorant             Do not wear nail polish.  Do not shave  48 hours prior to surgery.              Do not bring valuables to the hospital. [place].   Contacts, dentures or bridgework may not be worn into surgery.   Bring small overnight bag day of surgery.    Patients discharged the day of surgery will not be allowed to drive home.   Special Instructions: Bring a copy of your healthcare power of attorney and living will documents         the day of surgery if you haven't scanned them in before.              Please read over the following fact sheets you were given: IF YOU HAVE QUESTIONS ABOUT YOUR PRE OP INSTRUCTIONS PLEASE CALL [phone]    - Preparing for Surgery Before surgery, you can play an important role.  Because skin is not sterile, your skin needs to be as free of germs as possible.  You can reduce the number of germs on your skin by washing with CHG (chlorahexidine gluconate) soap before surgery.  CHG is an antiseptic cleaner which kills germs and bonds with the skin to continue killing germs even after washing. Please DO NOT use if you have an allergy  to CHG or antibacterial soaps.  If your skin becomes reddened/irritated stop using the CHG and inform your nurse when you arrive at Short Stay. Do not shave (including legs and  underarms) for at least 48 hours prior to the first CHG shower.  You may shave your face/neck.  Please follow these instructions carefully:  1.  Shower with CHG Soap the night before surgery and the  morning of surgery.  2.  If you choose to wash your hair, wash your hair first as usual with your normal  shampoo.  3.  After you shampoo, rinse your hair and body thoroughly to remove the shampoo.                             4.  Use CHG as you would any other liquid soap.  You can apply chg directly to the skin and wash.  Gently with a scrungie or clean washcloth.  5.  Apply the CHG Soap to your body ONLY FROM THE NECK DOWN.   Do   not use on face/ open                           Wound or open sores. Avoid contact with eyes, ears mouth and   genitals (private parts).                       Wash face,  Genitals (private parts) with your normal soap.             6.  Wash thoroughly, paying special attention to the area where your    surgery  will be performed.  7.  Thoroughly rinse your body with warm water from the neck down.  8.  DO NOT shower/wash with your normal soap after using and rinsing off the CHG Soap.                9.  Pat yourself dry with a clean towel.            10.  Wear clean pajamas.            11.  Place clean sheets on your bed the night of your first shower and do not  sleep with pets. Day of Surgery : Do not apply any lotions/deodorants the morning of surgery.  Please wear clean clothes to the hospital/surgery center.  FAILURE TO FOLLOW THESE INSTRUCTIONS MAY RESULT IN THE CANCELLATION OF YOUR SURGERY  PATIENT SIGNATURE_________________________________  NURSE SIGNATURE__________________________________  ________________________________________________________________________   Tonya Bartlett  An incentive spirometer is a tool that can help keep your lungs clear and active. This tool measures how well you are filling your lungs with each breath. Taking long deep  breaths may help reverse or decrease the chance of developing breathing (pulmonary) problems (especially infection) following:  A long period of time when you are unable to move or be active. BEFORE THE PROCEDURE   If the spirometer includes an indicator to show your best effort, your nurse or respiratory therapist will set it to a desired goal.  If possible, sit up straight or lean slightly forward. Try not to slouch.  Hold the incentive spirometer in an upright position. INSTRUCTIONS FOR USE  1. Sit on the edge of your bed if possible, or sit up as far as you can in bed or on a chair. 2. Hold the incentive spirometer in  an upright position. 3. Breathe out normally. 4. Place the mouthpiece in your mouth and seal your lips tightly around it. 5. Breathe in slowly and as deeply as possible, raising the piston or the ball toward the top of the column. 6. Hold your breath for 3-5 seconds or for as long as possible. Allow the piston or ball to fall to the bottom of the column. 7. Remove the mouthpiece from your mouth and breathe out normally. 8. Rest for a few seconds and repeat Steps 1 through 7 at least 10 times every 1-2 hours when you are awake. Take your time and take a few normal breaths between deep breaths. 9. The spirometer may include an indicator to show your best effort. Use the indicator as a goal to work toward during each repetition. 10. After each set of 10 deep breaths, practice coughing to be sure your lungs are clear. If you have an incision (the cut made at the time of surgery), support your incision when coughing by placing a pillow or rolled up towels firmly against it. Once you are able to get out of bed, walk around indoors and cough well. You may stop using the incentive spirometer when instructed by your caregiver.  RISKS AND COMPLICATIONS  Take your time so you do not get dizzy or light-headed.  If you are in pain, you may need to take or ask for pain medication before  doing incentive spirometry. It is harder to take a deep breath if you are having pain. AFTER USE  Rest and breathe slowly and easily.  It can be helpful to keep track of a log of your progress. Your caregiver can provide you with a simple table to help with this. If you are using the spirometer at home, follow these instructions: [place] IF:   You are having difficultly using the spirometer.  You have trouble using the spirometer as often as instructed.  Your pain medication is not giving enough relief while using the spirometer.  You develop fever of 100.5 F (38.1 C) or higher. SEEK IMMEDIATE MEDICAL CARE IF:   You cough up bloody sputum that had not been present before.  You develop fever of 102 F (38.9 C) or greater.  You develop worsening pain at or near the incision site. MAKE SURE YOU:   Understand these instructions.  Will watch your condition.  Will get help right away if you are not doing well or get worse. Document Released: 06/15/2006 Document Revised: 04/27/2011 Document Reviewed: 08/16/2006 ExitCare Patient Information 2014 ExitCare, [place].   ________________________________________________________________________  WHAT IS A BLOOD TRANSFUSION? Blood Transfusion Information  A transfusion is the replacement of blood or some of its parts. Blood is made up of multiple cells which provide different functions.  Red blood cells carry oxygen and are used for blood loss replacement.  White blood cells fight against infection.  Platelets control bleeding.  Plasma helps clot blood.  Other blood products are available for specialized needs, such as hemophilia or other clotting disorders. BEFORE THE TRANSFUSION  Who gives blood for transfusions?   Healthy volunteers who are fully evaluated to make sure their blood is safe. This is blood bank blood. Transfusion therapy is the safest it has ever been in the practice of medicine. Before blood is taken  from a donor, a complete history is taken to make sure that person has no history of diseases nor engages in risky social behavior (examples are intravenous drug use or sexual activity  with multiple partners). The donor's travel history is screened to minimize risk of transmitting infections, such as malaria. The donated blood is tested for signs of infectious diseases, such as HIV and hepatitis. The blood is then tested to be sure it is compatible with you in order to minimize the chance of a transfusion reaction. If you or a relative donates blood, this is often done in anticipation of surgery and is not appropriate for emergency situations. It takes many days to process the donated blood. RISKS AND COMPLICATIONS Although transfusion therapy is very safe and saves many lives, the main dangers of transfusion include:   Getting an infectious disease.  Developing a transfusion reaction. This is an allergic reaction to something in the blood you were given. Every precaution is taken to prevent this. The decision to have a blood transfusion has been considered carefully by your caregiver before blood is given. Blood is not given unless the benefits outweigh the risks. AFTER THE TRANSFUSION  Right after receiving a blood transfusion, you will usually feel much better and more energetic. This is especially true if your red blood cells have gotten low (anemic). The transfusion raises the level of the red blood cells which carry oxygen, and this usually causes an energy increase.  The nurse administering the transfusion will monitor you carefully for complications. HOME CARE INSTRUCTIONS  No special instructions are needed after a transfusion. You may find your energy is better. Speak with your caregiver about any limitations on activity for underlying diseases you may have. SEEK MEDICAL CARE IF:   Your condition is not improving after your transfusion.  You develop redness or irritation at the  intravenous (IV) site. SEEK IMMEDIATE MEDICAL CARE IF:  Any of the following symptoms occur over the next 12 hours:  Shaking chills.  You have a temperature by mouth above 102 F (38.9 C), not controlled by medicine.  Chest, back, or muscle pain.  People around you feel you are not acting correctly or are confused.  Shortness of breath or difficulty breathing.  Dizziness and fainting.  You get a rash or develop hives.  You have a decrease in urine output.  Your urine turns a dark color or changes to pink, red, or brown. Any of the following symptoms occur over the next 10 days:  You have a temperature by mouth above 102 F (38.9 C), not controlled by medicine.  Shortness of breath.  Weakness after normal activity.  The white part of the eye turns yellow (jaundice).  You have a decrease in the amount of urine or are urinating less often.  Your urine turns a dark color or changes to pink, red, or brown. Document Released: 01/31/2000 Document Revised: 04/27/2011 Document Reviewed: 09/19/2007 [name] Patient Information 2014 [place], [place].  _______________________________________________________________________

## 2019-07-20 DIAGNOSIS — Z808 Family history of malignant neoplasm of other organs or systems: Secondary | ICD-10-CM | POA: Diagnosis not present

## 2019-07-20 DIAGNOSIS — Z85820 Personal history of malignant melanoma of skin: Secondary | ICD-10-CM | POA: Diagnosis not present

## 2019-07-24 NOTE — H&P (Signed)
TOTAL HIP ADMISSION H&P  Patient is admitted for right total hip arthroplasty, anterior approach.  Subjective:  Chief Complaint: Right hip OA / pain  HPI: Tonya Bartlett, 74 y.o. female, has a history of pain and functional disability in the right hip(s) due to arthritis and patient has failed non-surgical conservative treatments for greater than 12 weeks to include corticosteriod injections and activity modification.  Onset of symptoms was gradual starting 1+ years ago with gradually worsening course since that time.The patient noted prior procedures of the hip to include arthroplasty on the left hip in 2019.  Patient currently rates pain in the right hip at 7 out of 10 with activity. Patient has night pain, worsening of pain with activity and weight bearing, trendelenberg gait, pain that interfers with activities of daily living and pain with passive range of motion. Patient has evidence of periarticular osteophytes and joint space narrowing by imaging studies. This condition presents safety issues increasing the risk of falls.   There is no current active infection. Risks, benefits and expectations were discussed with the patient.  Risks including but not limited to the risk of anesthesia, blood clots, nerve damage, blood vessel damage, failure of the prosthesis, infection and up to and including death.  Patient understand the risks, benefits and expectations and wishes to proceed with surgery.   PCP: [name], NP  D/C Plans:       Home   Post-op Meds:       No Rx given   Tranexamic Acid:      To be given - IV   Decadron:      Is to be given  FYI:     Xarelto (unable to use ASA or NSAIDs)  Norco  No Celebrex  DME:   Pt equipment arranged  PT:   [facility]  Pharmacy:  [facility] - [id number], [place], [place]    Patient Active Problem List   Diagnosis Date Noted  . Insomnia 02/03/2019  . Onychomycosis 11/28/2018  . Obese 05/05/2017  . 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 MCI  (mild cognitive impairment) 09/19/2015  . Memory deficit 09/19/2015  . SIRS (systemic inflammatory response syndrome) ([place]) 10/29/2014  . 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 Depression 11/16/2013  . ADD (attention deficit disorder) 11/17/2011  . Essential hypertension, benign 10/15/2009  . SEBORRHEIC KERATOSIS, INFLAMED 10/17/2008  . CARPAL TUNNEL SYNDROME, LEFT 06/22/2008  . Allergic rhinitis 08/30/2007  . Hyperlipidemia 11/11/2006  . Migraine 11/11/2006  . Osteoarthritis 11/11/2006   Past Medical History:  Diagnosis Date  . Allergic rhinitis   . Colitis   . Complication of anesthesia    vomit x1 while on Morphine per pt  . Depression   . Diverticulosis   . Frequency of urination   . GERD (gastroesophageal reflux disease)   . History of colon polyps   . 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 History of hiatal hernia   . History of iron deficiency anemia   . History of migraine headaches   . History of rib fracture   . Hyperlipidemia   . IBS (irritable bowel syndrome)   . Insomnia   . MCI (mild cognitive impairment)   . Melanoma ([place]) 2019   left leg   . Migraine headache    hx of migraines   . OA (osteoarthritis)    RIGHT SHOULDER  . PONV (postoperative nausea and vomiting)   .  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 Right rotator cuff tear   . Unspecified essential hypertension     Past Surgical History:  Procedure Laterality Date  . BUNIONECTOMY/  HAMMERTOE CORRECTION  RIGHT FOOT  2011  . CATARACT EXTRACTION W/ INTRAOCULAR LENS  IMPLANT, BILATERAL    . COLONOSCOPY    . KNEE ARTHROSCOPY W/ MENISCECTOMY Bilateral X2  LEFT /    X1  RIGHT  . MOHS SURGERY Left 12/15/2017   Melanoma in situ - left calf - [facility]  . REPLACEMENT TOTAL KNEE Left 2006  . SHOULDER ARTHROSCOPY WITH SUBACROMIAL DECOMPRESSION, ROTATOR CUFF REPAIR AND BICEP TENDON REPAIR Right 05/23/2013   Procedure: RIGHT SHOULDER  ARTHROSCOPY EXAM UNDER ANESTHESIA  WITH SUBACROMIAL DECOMPRESSION,DISTAL CLAVICLE RESECTION, SADLABRAL DEBRIDEMENT CHONDROPLASTY, BICEP TENOTOMY ;  Surgeon: Sydnee Cabal, MD;  Location: [facility];  Service: Orthopedics;  Laterality: Right;  . TONSILLECTOMY AND ADENOIDECTOMY  AGE 21  . TOTAL HIP ARTHROPLASTY Left 05/04/2017   Procedure: LEFT TOTAL HIP ARTHROPLASTY ANTERIOR APPROACH;  Surgeon: Paralee Cancel, MD;  Location: [facility];  Service: Orthopedics;  Laterality: Left;  . TOTAL KNEE ARTHROPLASTY Bilateral LEFT  1996/   RIGHT 2004  . 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 UPPER GI ENDOSCOPY    . VAGINAL HYSTERECTOMY  1976    No current facility-administered medications for this encounter.   Current Outpatient Medications  Medication Sig Dispense Refill Last Dose  . acetaminophen (TYLENOL) 500 MG tablet Take 500-1,000 mg by mouth every 6 (six) hours as needed (for pain.).     Marland Kitchen atorvastatin (LIPITOR) 20 MG tablet Take 1 tablet (20 mg total) by mouth daily at 6 PM. 90 tablet 3   . betamethasone dipropionate 0.05 % lotion Apply topically daily. (Patient taking differently: Apply 1 application topically every three (3) days as needed (scalp irritation.). ) 60 mL 2   . Calcium-Magnesium-Zinc (CAL-MAG-ZINC PO) Take 1 tablet by mouth every evening.     . Cholecalciferol (VITAMIN D3) 125 MCG (5000 UT) TABS Take 5,000 Units by mouth every evening.     . escitalopram (LEXAPRO) 10 MG tablet Take 1 tablet (10 mg total) by mouth daily. 90 tablet 0   . famotidine (PEPCID) 20 MG tablet Take one tablet (20mg ) once to twice a day as needed (Patient taking differently: Take 20 mg by mouth daily. ) 180 tablet 3   . FIBER PO Take 1 capsule by mouth in the morning and at bedtime.     . folic acid (FOLVITE) 1 MG tablet Take 1 mg by mouth daily.     Marland Kitchen gabapentin (NEURONTIN) 100 MG capsule Take 1 capsule (100 mg total) by mouth 3 (three) times daily as needed. (Patient taking differently: Take 100 mg  by mouth in the morning and at bedtime. ) 90 capsule 6   . HYDROcodone-acetaminophen (NORCO/VICODIN) 5-325 MG tablet Take 1 tablet by mouth every 6 (six) hours as needed (for pain.).      Marland Kitchen hydrocortisone cream 1 % Apply 1 application topically daily as needed for itching.     . losartan (COZAAR) 50 MG tablet TAKE 1 TABLET(50 MG) BY MOUTH DAILY (Patient taking differently: Take 50 mg by mouth daily. ) 90 tablet 3   . sulfamethoxazole-trimethoprim (BACTRIM,SEPTRA) 400-80 MG tablet Take 1 tablet by mouth daily. For urethritis.      . SUMAtriptan (IMITREX) 100 MG tablet Take 1 tablet (100 mg total) by mouth every 2 (two) hours  as needed for migraine. May repeat in 2 hours if headache persists or recurs. 10 tablet 0   . traZODone (DESYREL) 50 MG tablet Take 1 tablet (50 mg total) by mouth at bedtime. 90 tablet 1    Allergies  Allergen Reactions  . [place] Nail [Apremilast] Anaphylaxis    Suicidal ideation  . Penicillins Anaphylaxis    Has patient had a PCN reaction causing immediate rash, facial/tongue/throat swelling, SOB or lightheadedness with hypotension: Yes Has patient had a PCN reaction causing severe rash involving mucus membranes or skin necrosis: Yes Has patient had a PCN reaction that required hospitalization: No Has patient had a PCN reaction occurring within the last 10 year No If all of the above answers are "NO", then may proceed with Cephalosporin use.   . Erythromycin Other (See Comments)    SEVERE STOMACH CRAMPS  . Morphine And Related Nausea And Vomiting  . Nsaids Other (See Comments)    SEVERE STOMACH CRAMPS, MOUTH SORES **Able to tolerate Tylenol  . Remicade [Infliximab] Other (See Comments)    Shut down immune system-BP high  . Tolmetin     SEVERE STOMACH CRAMPS  . Nickel Rash    Including snaps on hospital gowns     Social History   Tobacco Use  . Smoking status: Former Smoker    Packs/day: 0.50    Years: 15.00    Pack years: 7.50    Types: Cigarettes    Quit  date: 05/15/1985    Years since quitting: 34.2  . Smokeless tobacco: Never Used  Substance Use Topics  . Alcohol use: No    Comment: RECOVERING ALCOHOLIC--  QUIT [id number]    Family History  Problem Relation Age of Onset  . Heart disease Father 21  . Hypertension Father   . Melanoma Mother   . Cancer Sister        skin CA, 2 sisters  . Esophageal cancer Brother   . Dementia Paternal Grandfather   . Colon cancer Neg Hx   . Colon polyps Neg Hx   . Stomach cancer Neg Hx   . Rectal cancer Neg Hx      Review of Systems  Constitutional: Negative.   HENT: Negative.   Eyes: Negative.   Respiratory: Negative.   Cardiovascular: Negative.   Gastrointestinal: Positive for heartburn.  Genitourinary: Negative.   Musculoskeletal: Positive for joint pain.  Skin: Negative.   Neurological: Positive for dizziness and headaches.  Endo/Heme/Allergies: Positive for environmental allergies.  Psychiatric/Behavioral: Positive for depression.      Objective:  Physical Exam  Constitutional: She is oriented to person, place, and time. She appears well-developed.  HENT:  Head: Normocephalic.  Eyes: Pupils are equal, round, and reactive to light.  Neck: No JVD present. No tracheal deviation present. No thyromegaly present.  Cardiovascular: Normal rate, regular rhythm and intact distal pulses.  Respiratory: Effort normal and breath sounds normal. No respiratory distress. She has no wheezes.  GI: Soft. There is no abdominal tenderness. There is no guarding.  Musculoskeletal:     Cervical back: Neck supple.     Right hip: Tenderness and bony tenderness present. No swelling, deformity or lacerations. Decreased range of motion. Decreased strength.  Lymphadenopathy:    She has no cervical adenopathy.  Neurological: She is alert and oriented to person, place, and time. A sensory deficit (bilateral neuropatrhy / twitch occ in left foot) is present.  Skin: Skin is warm and dry.  Psychiatric: She  has a normal mood and affect.  Labs:  Estimated body mass index is 31 kg/m as calculated from the following:   Height as of 02/03/19: 5' 6.5" (1.689 m).   Weight as of 02/03/19: 88.5 kg.   Imaging Review Plain radiographs demonstrate severe degenerative joint disease of the right hip. The bone quality appears to be good for age and reported activity level.      Assessment/Plan:  End stage arthritis, right hip  The patient history, physical examination, clinical judgement of the provider and imaging studies are consistent with end stage degenerative joint disease of the right hip and total hip arthroplasty is deemed medically necessary. The treatment options including medical management, injection therapy, arthroscopy and arthroplasty were discussed at length. The risks and benefits of total hip arthroplasty were presented and reviewed. The risks due to aseptic loosening, infection, stiffness, dislocation/subluxation,  thromboembolic complications and other imponderables were discussed.  The patient acknowledged the explanation, agreed to proceed with the plan and consent was signed. Patient is being admitted for treatment for surgery, pain control, PT, OT, prophylactic antibiotics, VTE prophylaxis, progressive ambulation and ADL's and discharge planning.The patient is planning to be discharged home.    [place] Pugh Hridhaan Yohn   PA-C  07/24/2019, 12:28 PM

## 2019-07-25 ENCOUNTER — Encounter (HOSPITAL_COMMUNITY): Payer: Self-pay

## 2019-07-25 ENCOUNTER — Other Ambulatory Visit: Payer: Self-pay

## 2019-07-25 ENCOUNTER — Encounter (HOSPITAL_COMMUNITY)
Admission: RE | Admit: 2019-07-25 | Discharge: 2019-07-25 | Disposition: A | Discharge status: Home / Self Care | Payer: Medicare Other | Source: Ambulatory Visit | Attending: Orthopedic Surgery | Admitting: Orthopedic Surgery

## 2019-07-25 DIAGNOSIS — Z01818 Encounter for other preprocedural examination: Secondary | ICD-10-CM | POA: Diagnosis not present

## 2019-07-25 HISTORY — DX: Insomnia, unspecified: G47.00

## 2019-07-25 HISTORY — DX: Pneumonia, unspecified organism: J18.9

## 2019-07-25 HISTORY — DX: Mild cognitive impairment of uncertain or unknown etiology: G31.84

## 2019-07-25 HISTORY — DX: Personal history of (healed) traumatic fracture: Z87.81

## 2019-07-25 HISTORY — DX: Personal history of colonic polyps: Z86.010

## 2019-07-25 HISTORY — DX: Personal history of colon polyps, unspecified: Z86.0100

## 2019-07-25 LAB — BASIC METABOLIC PANEL
Anion gap: 8 (ref 5–15)
BUN: 11 mg/dL (ref 8–23)
CO2: 28 mmol/L (ref 22–32)
Calcium: 8.9 mg/dL (ref 8.9–10.3)
Chloride: 105 mmol/L (ref 98–111)
Creatinine, Ser: 0.53 mg/dL (ref 0.44–1.00)
GFR calc Af Amer: >60 mL/min (ref >60)
GFR calc non Af Amer: >60 mL/min (ref >60)
Glucose, Bld: 97 mg/dL (ref 70–99)
Potassium: 3.7 mmol/L (ref 3.5–5.1)
Sodium: 141 mmol/L (ref 135–145)

## 2019-07-25 LAB — CBC
HCT: 38.1 % (ref 36.0–46.0)
Hemoglobin: 12.2 g/dL (ref 12.0–15.0)
MCH: 33.2 pg (ref 26.0–34.0)
MCHC: 32 g/dL (ref 30.0–36.0)
MCV: 103.5 fL — ABNORMAL HIGH (ref 80.0–100.0)
Platelets: 274 10*3/uL (ref 150–400)
RBC: 3.68 MIL/uL — ABNORMAL LOW (ref 3.87–5.11)
RDW: 12.2 % (ref 11.5–15.5)
WBC: 4.3 10*3/uL (ref 4.0–10.5)
nRBC: 0 % (ref 0.0–0.2)

## 2019-07-25 LAB — SURGICAL PCR SCREEN
MRSA, PCR: NEGATIVE
Staphylococcus aureus: NEGATIVE

## 2019-07-25 NOTE — Progress Notes (Signed)
COVID Vaccine Completed: Yes Date COVID Vaccine completed: COVID vaccine manufacturer:   PCP - C. Nafziger P.A. Last office visit 02/03/20 Cardiologist - N/A  Chest x-ray - N/A EKG - 07/25/19 epic Stress Test - greater 2 years  ECHO - N/A Cardiac Cath - N/A  Sleep Study - N/A CPAP - N/A  Fasting Blood Sugar - N/A Checks Blood Sugar __N/A___ times a day  Blood Thinner Instructions: N/A Aspirin Instructions:N/A Last Dose:N/A  Anesthesia review: N/A  Patient denies shortness of breath, fever, cough and chest pain at PAT appointment   Patient verbalized understanding of instructions that were given to them at the PAT appointment. Patient was also instructed that they will need to review over the PAT instructions again at home before surgery.

## 2019-07-26 ENCOUNTER — Encounter (HOSPITAL_COMMUNITY): Payer: Medicare Other

## 2019-07-29 ENCOUNTER — Other Ambulatory Visit (HOSPITAL_COMMUNITY)
Admission: RE | Admit: 2019-07-29 | Discharge: 2019-07-29 | Disposition: A | Discharge status: Home / Self Care | Payer: Medicare Other | Source: Ambulatory Visit | Attending: Orthopedic Surgery | Admitting: Orthopedic Surgery

## 2019-07-29 DIAGNOSIS — Z20822 Contact with and (suspected) exposure to covid-19: Secondary | ICD-10-CM | POA: Diagnosis not present

## 2019-07-29 DIAGNOSIS — Z01812 Encounter for preprocedural laboratory examination: Secondary | ICD-10-CM | POA: Insufficient documentation

## 2019-07-29 LAB — SARS CORONAVIRUS 2 (TAT 6-24 HRS): SARS Coronavirus 2: NEGATIVE

## 2019-07-31 MED ORDER — GENTAMICIN SULFATE 40 MG/ML IJ SOLN
5.0000 mg/kg | INTRAVENOUS | Status: AC
  Administered 2019-08-01: 350 mg via INTRAVENOUS
  Filled 2019-07-31 (×2): qty 8.75

## 2019-08-01 ENCOUNTER — Ambulatory Visit (HOSPITAL_COMMUNITY): Payer: Medicare Other | Admitting: Anesthesiology

## 2019-08-01 ENCOUNTER — Observation Stay (HOSPITAL_COMMUNITY): Payer: Medicare Other

## 2019-08-01 ENCOUNTER — Encounter (HOSPITAL_COMMUNITY)
Admission: RE | Disposition: A | Discharge status: Home / Self Care | Payer: Self-pay | Source: Home / Self Care | Attending: Orthopedic Surgery

## 2019-08-01 ENCOUNTER — Observation Stay (HOSPITAL_COMMUNITY)
Admission: RE | Admit: 2019-08-01 | Discharge: 2019-08-02 | Disposition: A | Discharge status: Home / Self Care | Payer: Medicare Other | Attending: Orthopedic Surgery | Admitting: Orthopedic Surgery

## 2019-08-01 ENCOUNTER — Other Ambulatory Visit: Payer: Self-pay

## 2019-08-01 ENCOUNTER — Ambulatory Visit (HOSPITAL_COMMUNITY): Payer: Medicare Other

## 2019-08-01 ENCOUNTER — Encounter (HOSPITAL_COMMUNITY): Payer: Self-pay | Admitting: Orthopedic Surgery

## 2019-08-01 DIAGNOSIS — Z881 Allergy status to other antibiotic agents status: Secondary | ICD-10-CM | POA: Diagnosis not present

## 2019-08-01 DIAGNOSIS — I10 Essential (primary) hypertension: Secondary | ICD-10-CM | POA: Diagnosis not present

## 2019-08-01 DIAGNOSIS — Z6832 Body mass index (BMI) 32.0-32.9, adult: Secondary | ICD-10-CM | POA: Insufficient documentation

## 2019-08-01 DIAGNOSIS — G43909 Migraine, unspecified, not intractable, without status migrainosus: Secondary | ICD-10-CM | POA: Diagnosis not present

## 2019-08-01 DIAGNOSIS — Z886 Allergy status to analgesic agent status: Secondary | ICD-10-CM | POA: Diagnosis not present

## 2019-08-01 DIAGNOSIS — M1611 Unilateral primary osteoarthritis, right hip: Secondary | ICD-10-CM | POA: Diagnosis present

## 2019-08-01 DIAGNOSIS — K219 Gastro-esophageal reflux disease without esophagitis: Secondary | ICD-10-CM | POA: Diagnosis not present

## 2019-08-01 DIAGNOSIS — Z79899 Other long term (current) drug therapy: Secondary | ICD-10-CM | POA: Diagnosis not present

## 2019-08-01 DIAGNOSIS — Z792 Long term (current) use of antibiotics: Secondary | ICD-10-CM | POA: Insufficient documentation

## 2019-08-01 DIAGNOSIS — Z87891 Personal history of nicotine dependence: Secondary | ICD-10-CM | POA: Insufficient documentation

## 2019-08-01 DIAGNOSIS — Z885 Allergy status to narcotic agent status: Secondary | ICD-10-CM | POA: Insufficient documentation

## 2019-08-01 DIAGNOSIS — F329 Major depressive disorder, single episode, unspecified: Secondary | ICD-10-CM | POA: Insufficient documentation

## 2019-08-01 DIAGNOSIS — Z96653 Presence of artificial knee joint, bilateral: Secondary | ICD-10-CM | POA: Diagnosis not present

## 2019-08-01 DIAGNOSIS — Z880 Allergy status to penicillin: Secondary | ICD-10-CM | POA: Insufficient documentation

## 2019-08-01 DIAGNOSIS — E669 Obesity, unspecified: Secondary | ICD-10-CM | POA: Diagnosis not present

## 2019-08-01 DIAGNOSIS — Z888 Allergy status to other drugs, medicaments and biological substances status: Secondary | ICD-10-CM | POA: Insufficient documentation

## 2019-08-01 DIAGNOSIS — Z419 Encounter for procedure for purposes other than remedying health state, unspecified: Secondary | ICD-10-CM

## 2019-08-01 DIAGNOSIS — Z471 Aftercare following joint replacement surgery: Secondary | ICD-10-CM | POA: Diagnosis not present

## 2019-08-01 DIAGNOSIS — Z96642 Presence of left artificial hip joint: Secondary | ICD-10-CM | POA: Insufficient documentation

## 2019-08-01 DIAGNOSIS — Z96649 Presence of unspecified artificial hip joint: Secondary | ICD-10-CM

## 2019-08-01 DIAGNOSIS — Z96641 Presence of right artificial hip joint: Secondary | ICD-10-CM

## 2019-08-01 HISTORY — PX: TOTAL HIP ARTHROPLASTY: SHX124

## 2019-08-01 LAB — TYPE AND SCREEN
ABO/RH(D): A POS
Antibody Screen: NEGATIVE

## 2019-08-01 SURGERY — ARTHROPLASTY, HIP, TOTAL, ANTERIOR APPROACH
Anesthesia: Monitor Anesthesia Care | Site: Hip | Laterality: Right

## 2019-08-01 MED ORDER — HYDROCODONE-ACETAMINOPHEN 7.5-325 MG PO TABS
1.0000 | ORAL_TABLET | ORAL | Status: DC | PRN
  Administered 2019-08-02: 2 via ORAL
  Filled 2019-08-01: qty 2

## 2019-08-01 MED ORDER — OXYCODONE HCL 5 MG PO TABS: 5.0000 mg | ORAL_TABLET | Freq: Once | ORAL | Status: DC | PRN

## 2019-08-01 MED ORDER — ESCITALOPRAM OXALATE 10 MG PO TABS: 10.0000 mg | ORAL_TABLET | Freq: Every day | ORAL | Status: DC

## 2019-08-01 MED ORDER — ALUM & MAG HYDROXIDE-SIMETH 200-200-20 MG/5ML PO SUSP: 15.0000 mL | ORAL | Status: DC | PRN

## 2019-08-01 MED ORDER — SUMATRIPTAN SUCCINATE 50 MG PO TABS
100.0000 mg | ORAL_TABLET | ORAL | Status: DC | PRN
  Filled 2019-08-01: qty 2

## 2019-08-01 MED ORDER — ACETAMINOPHEN 325 MG PO TABS
325.0000 mg | ORAL_TABLET | Freq: Four times a day (QID) | ORAL | Status: DC | PRN
  Filled 2019-08-01: qty 2

## 2019-08-01 MED ORDER — MENTHOL 3 MG MT LOZG: 1.0000 | LOZENGE | OROMUCOSAL | Status: DC | PRN

## 2019-08-01 MED ORDER — RIVAROXABAN 10 MG PO TABS
10.0000 mg | ORAL_TABLET | ORAL | Status: DC
  Administered 2019-08-02: 10 mg via ORAL
  Filled 2019-08-01: qty 1

## 2019-08-01 MED ORDER — POLYETHYLENE GLYCOL 3350 17 G PO PACK
17.0000 g | PACK | Freq: Two times a day (BID) | ORAL | Status: DC
  Administered 2019-08-01: 17 g via ORAL
  Filled 2019-08-01: qty 1

## 2019-08-01 MED ORDER — METOCLOPRAMIDE HCL 5 MG/ML IJ SOLN: 5.0000 mg | Freq: Three times a day (TID) | INTRAMUSCULAR | Status: DC | PRN

## 2019-08-01 MED ORDER — LACTATED RINGERS IV SOLN
INTRAVENOUS | Status: DC | PRN
Start: 2019-08-01 — End: 2019-08-01

## 2019-08-01 MED ORDER — BISACODYL 10 MG RE SUPP: 10.0000 mg | Freq: Every day | RECTAL | Status: DC | PRN

## 2019-08-01 MED ORDER — CHLORHEXIDINE GLUCONATE 0.12 % MT SOLN
15.0000 mL | Freq: Once | OROMUCOSAL | Status: AC
  Administered 2019-08-01: 15 mL via OROMUCOSAL

## 2019-08-01 MED ORDER — DIPHENHYDRAMINE HCL 12.5 MG/5ML PO ELIX: 12.5000 mg | ORAL_SOLUTION | ORAL | Status: DC | PRN

## 2019-08-01 MED ORDER — FENTANYL CITRATE (PF) 100 MCG/2ML IJ SOLN
INTRAMUSCULAR | Status: AC
  Administered 2019-08-01: 50 ug via INTRAVENOUS
  Filled 2019-08-01: qty 2

## 2019-08-01 MED ORDER — DOCUSATE SODIUM 100 MG PO CAPS
100.0000 mg | ORAL_CAPSULE | Freq: Two times a day (BID) | ORAL | Status: DC
  Administered 2019-08-01: 100 mg via ORAL
  Filled 2019-08-01: qty 1

## 2019-08-01 MED ORDER — STERILE WATER FOR IRRIGATION IR SOLN
Status: DC | PRN
  Administered 2019-08-01: 2000 mL

## 2019-08-01 MED ORDER — VANCOMYCIN HCL IN DEXTROSE 1-5 GM/200ML-% IV SOLN
1000.0000 mg | INTRAVENOUS | Status: AC
  Administered 2019-08-01: 1000 mg via INTRAVENOUS
  Filled 2019-08-01: qty 200

## 2019-08-01 MED ORDER — OXYCODONE HCL 5 MG/5ML PO SOLN: 5.0000 mg | Freq: Once | ORAL | Status: DC | PRN

## 2019-08-01 MED ORDER — ACETAMINOPHEN 160 MG/5ML PO SOLN: 325.0000 mg | ORAL | Status: DC | PRN

## 2019-08-01 MED ORDER — PROPOFOL 500 MG/50ML IV EMUL
INTRAVENOUS | Status: DC | PRN
  Administered 2019-08-01: 100 ug/kg/min via INTRAVENOUS

## 2019-08-01 MED ORDER — SODIUM CHLORIDE 0.9 % IV SOLN: INTRAVENOUS | Status: DC

## 2019-08-01 MED ORDER — HYDROCODONE-ACETAMINOPHEN 5-325 MG PO TABS
1.0000 | ORAL_TABLET | ORAL | Status: DC | PRN
  Administered 2019-08-01 – 2019-08-02 (×4): 2 via ORAL
  Filled 2019-08-01 (×2): qty 2
  Filled 2019-08-01 (×2): qty 1
  Filled 2019-08-01: qty 2

## 2019-08-01 MED ORDER — PROPOFOL 10 MG/ML IV BOLUS
INTRAVENOUS | Status: AC
  Filled 2019-08-01: qty 20

## 2019-08-01 MED ORDER — ONDANSETRON HCL 4 MG/2ML IJ SOLN: 4.0000 mg | Freq: Four times a day (QID) | INTRAMUSCULAR | Status: DC | PRN

## 2019-08-01 MED ORDER — VANCOMYCIN HCL IN DEXTROSE 1-5 GM/200ML-% IV SOLN
1000.0000 mg | Freq: Two times a day (BID) | INTRAVENOUS | Status: AC
  Administered 2019-08-01: 1000 mg via INTRAVENOUS
  Filled 2019-08-01: qty 200

## 2019-08-01 MED ORDER — LOSARTAN POTASSIUM 50 MG PO TABS: 50.0000 mg | ORAL_TABLET | Freq: Every day | ORAL | Status: DC

## 2019-08-01 MED ORDER — ONDANSETRON HCL 4 MG/2ML IJ SOLN
INTRAMUSCULAR | Status: DC | PRN
  Administered 2019-08-01: 4 mg via INTRAVENOUS

## 2019-08-01 MED ORDER — TRANEXAMIC ACID-NACL 1000-0.7 MG/100ML-% IV SOLN
1000.0000 mg | Freq: Once | INTRAVENOUS | Status: AC
  Administered 2019-08-01: 1000 mg via INTRAVENOUS
  Filled 2019-08-01: qty 100

## 2019-08-01 MED ORDER — MEPERIDINE HCL 50 MG/ML IJ SOLN: 6.2500 mg | INTRAMUSCULAR | Status: DC | PRN

## 2019-08-01 MED ORDER — ONDANSETRON HCL 4 MG/2ML IJ SOLN
INTRAMUSCULAR | Status: AC
  Filled 2019-08-01: qty 2

## 2019-08-01 MED ORDER — ATORVASTATIN CALCIUM 20 MG PO TABS
20.0000 mg | ORAL_TABLET | Freq: Every day | ORAL | Status: DC
  Administered 2019-08-01: 20 mg via ORAL
  Filled 2019-08-01: qty 1

## 2019-08-01 MED ORDER — MAGNESIUM CITRATE PO SOLN: 1.0000 | Freq: Once | ORAL | Status: DC | PRN

## 2019-08-01 MED ORDER — PHENYLEPHRINE HCL (PRESSORS) 10 MG/ML IV SOLN
INTRAVENOUS | Status: DC | PRN
Start: 2019-08-01 — End: 2019-08-01
  Administered 2019-08-01: 80 ug via INTRAVENOUS
  Administered 2019-08-01: 120 ug via INTRAVENOUS

## 2019-08-01 MED ORDER — ONDANSETRON HCL 4 MG PO TABS: 4.0000 mg | ORAL_TABLET | Freq: Four times a day (QID) | ORAL | Status: DC | PRN

## 2019-08-01 MED ORDER — ACETAMINOPHEN 325 MG PO TABS: 325.0000 mg | ORAL_TABLET | ORAL | Status: DC | PRN

## 2019-08-01 MED ORDER — METHOCARBAMOL 500 MG IVPB - SIMPLE MED
INTRAVENOUS | Status: AC
  Administered 2019-08-01: 500 mg via INTRAVENOUS
  Filled 2019-08-01: qty 50

## 2019-08-01 MED ORDER — LACTATED RINGERS IV SOLN: INTRAVENOUS | Status: DC | PRN

## 2019-08-01 MED ORDER — GABAPENTIN 100 MG PO CAPS
100.0000 mg | ORAL_CAPSULE | Freq: Two times a day (BID) | ORAL | Status: DC
  Administered 2019-08-01: 100 mg via ORAL
  Filled 2019-08-01: qty 1

## 2019-08-01 MED ORDER — PROPOFOL 10 MG/ML IV BOLUS
INTRAVENOUS | Status: DC | PRN
  Administered 2019-08-01 (×2): 20 mg via INTRAVENOUS

## 2019-08-01 MED ORDER — FENTANYL CITRATE (PF) 100 MCG/2ML IJ SOLN
25.0000 ug | INTRAMUSCULAR | Status: DC | PRN
  Administered 2019-08-01: 50 ug via INTRAVENOUS

## 2019-08-01 MED ORDER — TRAZODONE HCL 50 MG PO TABS
50.0000 mg | ORAL_TABLET | Freq: Every day | ORAL | Status: DC
  Administered 2019-08-01: 50 mg via ORAL
  Filled 2019-08-01: qty 1

## 2019-08-01 MED ORDER — METHOCARBAMOL 500 MG PO TABS
500.0000 mg | ORAL_TABLET | Freq: Four times a day (QID) | ORAL | Status: DC | PRN
  Administered 2019-08-01 – 2019-08-02 (×3): 500 mg via ORAL
  Filled 2019-08-01 (×3): qty 1

## 2019-08-01 MED ORDER — TRANEXAMIC ACID-NACL 1000-0.7 MG/100ML-% IV SOLN
1000.0000 mg | INTRAVENOUS | Status: AC
  Administered 2019-08-01: 1000 mg via INTRAVENOUS
  Filled 2019-08-01: qty 100

## 2019-08-01 MED ORDER — PHENOL 1.4 % MT LIQD: 1.0000 | OROMUCOSAL | Status: DC | PRN

## 2019-08-01 MED ORDER — METHOCARBAMOL 500 MG IVPB - SIMPLE MED
500.0000 mg | Freq: Four times a day (QID) | INTRAVENOUS | Status: DC | PRN
  Filled 2019-08-01: qty 50

## 2019-08-01 MED ORDER — BUPIVACAINE IN DEXTROSE 0.75-8.25 % IT SOLN
INTRATHECAL | Status: DC | PRN
Start: 2019-08-01 — End: 2019-08-01
  Administered 2019-08-01: 1.8 mL via INTRATHECAL

## 2019-08-01 MED ORDER — DEXAMETHASONE SODIUM PHOSPHATE 10 MG/ML IJ SOLN
10.0000 mg | Freq: Once | INTRAMUSCULAR | Status: AC
  Administered 2019-08-01: 8 mg via INTRAVENOUS

## 2019-08-01 MED ORDER — PHENYLEPHRINE HCL-NACL 10-0.9 MG/250ML-% IV SOLN
INTRAVENOUS | Status: DC | PRN
  Administered 2019-08-01: 35 ug/min via INTRAVENOUS

## 2019-08-01 MED ORDER — MIDAZOLAM HCL 2 MG/2ML IJ SOLN
INTRAMUSCULAR | Status: AC
  Filled 2019-08-01: qty 2

## 2019-08-01 MED ORDER — ONDANSETRON HCL 4 MG/2ML IJ SOLN: 4.0000 mg | Freq: Once | INTRAMUSCULAR | Status: DC | PRN

## 2019-08-01 MED ORDER — FERROUS SULFATE 325 (65 FE) MG PO TABS
325.0000 mg | ORAL_TABLET | Freq: Three times a day (TID) | ORAL | Status: DC
  Administered 2019-08-02: 325 mg via ORAL
  Filled 2019-08-01: qty 1

## 2019-08-01 MED ORDER — GENTAMICIN SULFATE 40 MG/ML IJ SOLN: 5.0000 mg/kg | INTRAVENOUS | Status: DC

## 2019-08-01 MED ORDER — ORAL CARE MOUTH RINSE: 15.0000 mL | Freq: Once | OROMUCOSAL | Status: AC

## 2019-08-01 MED ORDER — MIDAZOLAM HCL 2 MG/2ML IJ SOLN
INTRAMUSCULAR | Status: DC | PRN
  Administered 2019-08-01: 2 mg via INTRAVENOUS

## 2019-08-01 MED ORDER — DEXAMETHASONE SODIUM PHOSPHATE 10 MG/ML IJ SOLN
10.0000 mg | Freq: Once | INTRAMUSCULAR | Status: DC
  Filled 2019-08-01: qty 1

## 2019-08-01 MED ORDER — 0.9 % SODIUM CHLORIDE (POUR BTL) OPTIME
TOPICAL | Status: DC | PRN
  Administered 2019-08-01: 1000 mL

## 2019-08-01 MED ORDER — FENTANYL CITRATE (PF) 100 MCG/2ML IJ SOLN: 25.0000 ug | INTRAMUSCULAR | Status: DC | PRN

## 2019-08-01 MED ORDER — METOCLOPRAMIDE HCL 5 MG PO TABS: 5.0000 mg | ORAL_TABLET | Freq: Three times a day (TID) | ORAL | Status: DC | PRN

## 2019-08-01 MED ORDER — PROPOFOL 1000 MG/100ML IV EMUL
INTRAVENOUS | Status: AC
  Filled 2019-08-01: qty 100

## 2019-08-01 MED ORDER — DEXAMETHASONE SODIUM PHOSPHATE 10 MG/ML IJ SOLN
INTRAMUSCULAR | Status: AC
  Filled 2019-08-01: qty 1

## 2019-08-01 MED ORDER — FAMOTIDINE 20 MG PO TABS: 20.0000 mg | ORAL_TABLET | Freq: Every day | ORAL | Status: DC

## 2019-08-01 SURGICAL SUPPLY — 47 items
ADH SKN CLS APL DERMABOND .7 (GAUZE/BANDAGES/DRESSINGS) ×1
BAG DECANTER FOR FLEXI CONT (MISCELLANEOUS) IMPLANT
BAG SPEC THK2 15X12 ZIP CLS (MISCELLANEOUS)
BAG ZIPLOCK 12X15 (MISCELLANEOUS) IMPLANT
BLADE SAG 18X100X1.27 (BLADE) ×2 IMPLANT
BLADE SURG SZ10 CARB STEEL (BLADE) ×4 IMPLANT
COVER PERINEAL POST (MISCELLANEOUS) ×2 IMPLANT
COVER SURGICAL LIGHT HANDLE (MISCELLANEOUS) ×2 IMPLANT
COVER WAND RF STERILE (DRAPES) IMPLANT
CUP ACETBLR 52 OD PINNACLE (Hips) ×2 IMPLANT
DERMABOND ADVANCED (GAUZE/BANDAGES/DRESSINGS) ×1
DERMABOND ADVANCED .7 DNX12 (GAUZE/BANDAGES/DRESSINGS) ×1 IMPLANT
DRAPE STERI IOBAN 125X83 (DRAPES) ×2 IMPLANT
DRAPE U-SHAPE 47X51 STRL (DRAPES) ×4 IMPLANT
DRESSING AQUACEL AG SP 3.5X10 (GAUZE/BANDAGES/DRESSINGS) ×1 IMPLANT
DRSG AQUACEL AG SP 3.5X10 (GAUZE/BANDAGES/DRESSINGS) ×2
DURAPREP 26ML APPLICATOR (WOUND CARE) ×2 IMPLANT
ELECT REM PT RETURN 15FT ADLT (MISCELLANEOUS) ×2 IMPLANT
ELIMINATOR HOLE APEX DEPUY (Hips) ×2 IMPLANT
GLOVE BIO SURGEON STRL SZ 6 (GLOVE) ×4 IMPLANT
GLOVE BIOGEL PI IND STRL 6.5 (GLOVE) ×1 IMPLANT
GLOVE BIOGEL PI IND STRL 7.5 (GLOVE) ×1 IMPLANT
GLOVE BIOGEL PI IND STRL 8.5 (GLOVE) ×1 IMPLANT
GLOVE BIOGEL PI INDICATOR 6.5 (GLOVE) ×1
GLOVE BIOGEL PI INDICATOR 7.5 (GLOVE) ×1
GLOVE BIOGEL PI INDICATOR 8.5 (GLOVE) ×1
GLOVE ECLIPSE 8.0 STRL XLNG CF (GLOVE) ×4 IMPLANT
GLOVE ORTHO TXT STRL SZ7.5 (GLOVE) ×4 IMPLANT
GOWN STRL REUS W/TWL LRG LVL3 (GOWN DISPOSABLE) ×4 IMPLANT
GOWN STRL REUS W/TWL XL LVL3 (GOWN DISPOSABLE) ×2 IMPLANT
HEAD CERAMIC DELTA 36 PLUS 1.5 (Hips) ×2 IMPLANT
HOLDER FOLEY CATH W/STRAP (MISCELLANEOUS) ×2 IMPLANT
KIT TURNOVER KIT A (KITS) IMPLANT
LINER NEUTRAL 52X36MM PLUS 4 (Liner) ×2 IMPLANT
PACK ANTERIOR HIP CUSTOM (KITS) ×2 IMPLANT
PENCIL SMOKE EVACUATOR (MISCELLANEOUS) IMPLANT
SCREW 6.5MMX30MM (Screw) ×2 IMPLANT
STEM FEMORAL SZ5 HIGH ACTIS (Stem) ×2 IMPLANT
SUT MNCRL AB 4-0 PS2 18 (SUTURE) ×2 IMPLANT
SUT STRATAFIX 0 PDS 27 VIOLET (SUTURE) ×2
SUT VIC AB 1 CT1 36 (SUTURE) ×6 IMPLANT
SUT VIC AB 2-0 CT1 27 (SUTURE) ×4
SUT VIC AB 2-0 CT1 TAPERPNT 27 (SUTURE) ×2 IMPLANT
SUTURE STRATFX 0 PDS 27 VIOLET (SUTURE) ×1 IMPLANT
TRAY FOLEY MTR SLVR 16FR STAT (SET/KITS/TRAYS/PACK) IMPLANT
WATER STERILE IRR 1000ML POUR (IV SOLUTION) ×2 IMPLANT
YANKAUER SUCT BULB TIP 10FT TU (MISCELLANEOUS) IMPLANT

## 2019-08-01 NOTE — Care Plan (Signed)
Ortho Bundle Case Management Note  Patient Details  Name: Tonya Bartlett MRN: [id number] Date of Birth: 29-Nov-1945  R THA on 08-01-19 DCP:  Home with granddtrs.  1 story home with 7 ste. DME:  Has a RW.  3-in-1 ordered through [place]. PT:  HEP                   DME Arranged:  3-N-1 DME Agency:  [employer]  HH Arranged:  NA HH Agency:  NA  Additional Comments: Please contact me with any questions of if this plan should need to change.  Marianne Sofia, RN,CCM [facility]  [phone] 08/01/2019, 9:29 AM

## 2019-08-01 NOTE — Anesthesia Preprocedure Evaluation (Signed)
Anesthesia Evaluation  Patient identified by MRN, date of birth, ID band Patient awake    Reviewed: Allergy & Precautions, NPO status , Patient's Chart, lab work & pertinent test results  History of Anesthesia Complications (+) PONV and history of anesthetic complications  Airway Mallampati: II  TM Distance: >3 FB Neck ROM: Full    Dental   Pulmonary former smoker,    Pulmonary exam normal        Cardiovascular hypertension, Pt. on medications Normal cardiovascular exam     Neuro/Psych Depression    GI/Hepatic GERD  Medicated and Controlled,  Endo/Other    Renal/GU      Musculoskeletal   Abdominal   Peds  Hematology   Anesthesia Other Findings   Reproductive/Obstetrics                             Anesthesia Physical  Anesthesia Plan  ASA: III  Anesthesia Plan: MAC and Spinal   Post-op Pain Management:    Induction: Intravenous  PONV Risk Score and Plan: 3 and Ondansetron, Dexamethasone and Midazolam  Airway Management Planned: Simple Face Mask  Additional Equipment:   Intra-op Plan:   Post-operative Plan:   Informed Consent: I have reviewed the patients History and Physical, chart, labs and discussed the procedure including the risks, benefits and alternatives for the proposed anesthesia with the patient or authorized representative who has indicated his/her understanding and acceptance.       Plan Discussed with: CRNA and Surgeon  Anesthesia Plan Comments:         Anesthesia Quick Evaluation

## 2019-08-01 NOTE — Evaluation (Signed)
Physical Therapy Evaluation Patient Details Name: Tonya Bartlett MRN: [id number] DOB: Apr 03, 1945 Today's Date: 08/01/2019   History of Present Illness  R AA-THA 08/01/19. PMH L ulnar nerve transplant, B TKA, L THA, R RCR  Clinical Impression  Pt is s/p THA resulting in the deficits listed below (see PT Problem List). Pt ambulated 40' with RW, distance limited by nausea. HR 44, BP 109/50, SaO2 92% on room air.  Pt will benefit from skilled PT to increase their independence and safety with mobility to allow discharge to the venue listed below.      Follow Up Recommendations Follow surgeon's recommendation for DC plan and follow-up therapies    Equipment Recommendations  3in1 (PT)    Recommendations for Other Services       Precautions / Restrictions Precautions Precautions: Fall Restrictions Weight Bearing Restrictions: No Other Position/Activity Restrictions: WBAT      Mobility  Bed Mobility Overal bed mobility: Needs Assistance Bed Mobility: Supine to Sit     Supine to sit: Min assist     General bed mobility comments: assist to advance RLE  Transfers Overall transfer level: Needs assistance Equipment used: Rolling walker (2 wheeled) Transfers: Sit to/from Stand Sit to Stand: Min assist         General transfer comment: min A to rise  Ambulation/Gait Ambulation/Gait assistance: Min guard Gait Distance (Feet): 40 Feet Assistive device: Rolling walker (2 wheeled) Gait Pattern/deviations: Step-to pattern;Decreased step length - right;Decreased step length - left Gait velocity: decr   General Gait Details: VCs sequencing, distance limited by nausea, assisted pt to recliner. In recliner HR 44, BP 109/50, SaO2 92% on room air, RN notified of low HR.  Stairs            Wheelchair Mobility    Modified Rankin (Stroke Patients Only)       Balance Overall balance assessment: Modified Independent                                            Pertinent Vitals/Pain Pain Assessment: 0-10 Pain Score: 7  Pain Location: R hip Pain Descriptors / Indicators: Sore Pain Intervention(s): Limited activity within patient's tolerance;Monitored during session;Premedicated before session (pt declined ice)    Home Living Family/patient expects to be discharged to:: Private residence Living Arrangements: Children;Other relatives Available Help at Discharge: Family;Available PRN/intermittently Type of Home: House Home Access: Stairs to enter   [employer] of Steps: 7 Home Layout: One level Home Equipment: Environmental consultant - 2 wheels Additional Comments: will stay with daughter and granddaughter    Prior Function Level of Independence: Independent               Hand Dominance        Extremity/Trunk Assessment   Upper Extremity Assessment Upper Extremity Assessment: Overall WFL for tasks assessed    Lower Extremity Assessment Lower Extremity Assessment: RLE deficits/detail RLE Deficits / Details: hip +2/5, hip AAROM WFL RLE Sensation: WNL RLE Coordination: WNL    Cervical / Trunk Assessment Cervical / Trunk Assessment: Normal  Communication   Communication: No difficulties  Cognition Arousal/Alertness: Awake/alert Behavior During Therapy: WFL for tasks assessed/performed Overall Cognitive Status: Within Functional Limits for tasks assessed  General Comments      Exercises Total Joint Exercises Ankle Circles/Pumps: AROM;Both;10 reps;Supine   Assessment/Plan    PT Assessment Patient needs continued PT services  PT Problem List Decreased strength;Decreased range of motion;Decreased activity tolerance;Pain;Decreased mobility       PT Treatment Interventions Gait training;Functional mobility training;Therapeutic exercise;Therapeutic activities;Patient/family education    PT Goals (Current goals can be found in the Care Plan section)  Acute Rehab  PT Goals Patient Stated Goal: go to [facility] in Sept PT Goal Formulation: With patient Time For Goal Achievement: 08/08/19 Potential to Achieve Goals: Good    Frequency 7X/week   Barriers to discharge        Co-evaluation               AM-PAC PT "6 Clicks" Mobility  Outcome Measure Help needed turning from your back to your side while in a flat bed without using bedrails?: A Little Help needed moving from lying on your back to sitting on the side of a flat bed without using bedrails?: A Little Help needed moving to and from a bed to a chair (including a wheelchair)?: A Little Help needed standing up from a chair using your arms (e.g., wheelchair or bedside chair)?: A Little Help needed to walk in hospital room?: A Little Help needed climbing 3-5 steps with a railing? : A Lot 6 Click Score: 17    End of Session Equipment Utilized During Treatment: Gait belt Activity Tolerance: Treatment limited secondary to medical complications (Comment) (nausea, bradycardia) Patient left: in chair;with call bell/phone within reach;with nursing/sitter in room;with family/visitor present;with chair alarm set Nurse Communication: Mobility status PT Visit Diagnosis: Pain;Muscle weakness (generalized) (M62.81);Difficulty in walking, not elsewhere classified (R26.2) Pain - Right/Left: Right Pain - part of body: Hip    Time: 8786-7672 PT Time Calculation (min) (ACUTE ONLY): 24 min   Charges:   PT Evaluation $PT Eval Low Complexity: 1 Low PT Treatments $Gait Training: 8-22 mins        Blondell Reveal Kistler PT 08/01/2019  Acute Rehabilitation Services Pager [phone] Office [phone]

## 2019-08-01 NOTE — Interval H&P Note (Signed)
History and Physical Interval Note:  08/01/2019 9:43 AM  Tonya Bartlett  has presented today for surgery, with the diagnosis of Right hip osteoarthritis.  The various methods of treatment have been discussed with the patient and family. After consideration of risks, benefits and other options for treatment, the patient has consented to  Procedure(s) with comments: [name] (Right) - 92mins as a surgical intervention.  The patient's history has been reviewed, patient examined, no change in status, stable for surgery.  I have reviewed the patient's chart and labs.  Questions were answered to the patient's satisfaction.     Mauri Pole

## 2019-08-01 NOTE — Op Note (Signed)
NAME:  Tonya Bartlett                ACCOUNT NO.: [id number]      MEDICAL RECORD NO.: [id number]      FACILITY:  [facility]      PHYSICIAN:  Mauri Pole  DATE OF BIRTH:  1945-04-13     DATE OF PROCEDURE:  08/01/2019                                 OPERATIVE REPORT         PREOPERATIVE DIAGNOSIS: Right  hip osteoarthritis.      POSTOPERATIVE DIAGNOSIS:  Right hip osteoarthritis.      PROCEDURE:  Right total hip replacement through an anterior approach   utilizing DePuy THR system, component size 52 mm pinnacle cup, a size 36+4 neutral   Altrex liner, a size 5 Hi Actis stem with a 36+1.5 delta ceramic   ball.      SURGEON:  Pietro Cassis. Alvan Dame, M.D.      ASSISTANT:  Danae Orleans, PA-C     ANESTHESIA:  Spinal.      SPECIMENS:  None.      COMPLICATIONS:  None.      BLOOD LOSS:  300 cc     DRAINS:  None.      INDICATION OF THE PROCEDURE:  Tonya Bartlett is a 74 y.o. female who had   presented to office for evaluation of right hip pain.  Radiographs revealed   progressive degenerative changes with bone-on-bone   articulation of the  hip joint, including subchondral cystic changes and osteophytes.  The patient had painful limited range of   motion significantly affecting their overall quality of life and function.  The patient was failing to    respond to conservative measures including medications and/or injections and activity modification and at this point was ready   to proceed with more definitive measures.  Consent was obtained for   benefit of pain relief.  Specific risks of infection, DVT, component   failure, dislocation, neurovascular injury, and need for revision surgery were reviewed in the office as well discussion of   the anterior versus posterior approach were reviewed.     PROCEDURE IN DETAIL:  The patient was brought to operative theater.   Once adequate anesthesia, preoperative antibiotics, 2 gm of Ancef, 1 gm of Tranexamic Acid, and  10 mg of Decadron were administered, the patient was positioned supine on the [employer] table.  Once the patient was safely positioned with adequate padding of boney prominences we predraped out the hip, and used fluoroscopy to confirm orientation of the pelvis.      The right hip was then prepped and draped from proximal iliac crest to   mid thigh with a shower curtain technique.      Time-out was performed identifying the patient, planned procedure, and the appropriate extremity.     An incision was then made 2 cm lateral to the   anterior superior iliac spine extending over the orientation of the   tensor fascia lata muscle and sharp dissection was carried down to the   fascia of the muscle.      The fascia was then incised.  The muscle belly was identified and swept   laterally and retractor placed along the superior neck.  Following   cauterization of the circumflex vessels and removing some  pericapsular   fat, a second cobra retractor was placed on the inferior neck.  A T-capsulotomy was made along the line of the   superior neck to the trochanteric fossa, then extended proximally and   distally.  Tag sutures were placed and the retractors were then placed   intracapsular.  We then identified the trochanteric fossa and   orientation of my neck cut and then made a neck osteotomy with the femur on traction.  The femoral   head was removed without difficulty or complication.  Traction was let   off and retractors were placed posterior and anterior around the   acetabulum.      The labrum and foveal tissue were debrided.  I began reaming with a 46 mm   reamer and reamed up to 51 mm reamer with good bony bed preparation and a 52 mm  cup was chosen.  The final 52 mm Pinnacle cup was then impacted under fluoroscopy to confirm the depth of penetration and orientation with respect to   Abduction and forward flexion.  A screw was placed into the ilium followed by the hole eliminator.  The  final   36+4 neutral Altrex liner was impacted with good visualized rim fit.  The cup was positioned anatomically within the acetabular portion of the pelvis.      At this point, the femur was rolled to 100 degrees.  Further capsule was   released off the inferior aspect of the femoral neck.  I then   released the superior capsule proximally.  With the leg in a neutral position the hook was placed laterally   along the femur under the vastus lateralis origin and elevated manually and then held in position using the hook attachment on the bed.  The leg was then extended and adducted with the leg rolled to 100   degrees of external rotation.  Retractors were placed along the medial calcar and posteriorly over the greater trochanter.  Once the proximal femur was fully   exposed, I used a box osteotome to set orientation.  I then began   broaching with the starting chili pepper broach and passed this by hand and then broached up to 5.  With the 5 broach in place I chose a high offset neck and did several trial reductions.  The offset was appropriate, leg lengths   appeared to be equal best matched with the +1.5 head ball trial confirmed radiographically.   Given these findings, I went ahead and dislocated the hip, repositioned all   retractors and positioned the right hip in the extended and abducted position.  The final 5 Hi Actis stem was   chosen and it was impacted down to the level of neck cut.  Based on this   and the trial reductions, a final 36+1.5 delta ceramic ball was chosen and   impacted onto a clean and dry trunnion, and the hip was reduced.  The   hip had been irrigated throughout the case again at this point.  I did   reapproximate the superior capsular leaflet to the anterior leaflet   using #1 Vicryl.  The fascia of the   tensor fascia lata muscle was then reapproximated using #1 Vicryl and #0 Stratafix sutures.  The   remaining wound was closed with 2-0 Vicryl and running 4-0  Monocryl.   The hip was cleaned, dried, and dressed sterilely using Dermabond and   Aquacel dressing.  The patient was then brought   to  recovery room in stable condition tolerating the procedure well.    Danae Orleans, PA-C was present for the entirety of the case involved from   preoperative positioning, perioperative retractor management, general   facilitation of the case, as well as primary wound closure as assistant.            Pietro Cassis Alvan Dame, M.D.        08/01/2019 11:57 AM

## 2019-08-01 NOTE — Anesthesia Postprocedure Evaluation (Signed)
Anesthesia Post Note  Patient: Viha Kriegel Benton  Procedure(s) Performed: TOTAL HIP ARTHROPLASTY ANTERIOR APPROACH (Right Hip)     Patient location during evaluation: PACU Anesthesia Type: MAC Level of consciousness: awake and alert Pain management: pain level controlled Vital Signs Assessment: post-procedure vital signs reviewed and stable Respiratory status: spontaneous breathing, nonlabored ventilation, respiratory function stable and patient connected to nasal cannula oxygen Cardiovascular status: stable and blood pressure returned to baseline Postop Assessment: no apparent nausea or vomiting Anesthetic complications: no   No complications documented.  Last Vitals:  Vitals:   08/01/19 1422 08/01/19 1528  BP: (!) 150/74 (!) 155/68  Pulse: (!) 57 (!) 56  Resp: 15 16  Temp: 36.6 C 36.7 C  SpO2: 100% 97%    Last Pain:  Vitals:   08/01/19 1528  TempSrc: Oral  PainSc:                  Zaydrian Batta

## 2019-08-01 NOTE — Transfer of Care (Signed)
Immediate Anesthesia Transfer of Care Note  Patient: Tonya Bartlett  Procedure(s) Performed: TOTAL HIP ARTHROPLASTY ANTERIOR APPROACH (Right Hip)  Patient Location: PACU  Anesthesia Type:Spinal  Level of Consciousness: awake, alert  and oriented  Airway & Oxygen Therapy: Patient Spontanous Breathing and Patient connected to face mask oxygen  Post-op Assessment: Report given to RN and Post -op Vital signs reviewed and stable  Post vital signs: Reviewed and stable  Last Vitals:  Vitals Value Taken Time  BP    Temp    Pulse 54 08/01/19 1225  Resp 16 08/01/19 1225  SpO2 100 % 08/01/19 1225  Vitals shown include unvalidated device data.  Last Pain:  Vitals:   08/01/19 0936  TempSrc:   PainSc: 0-No pain      Patients Stated Pain Goal: 4 (90/24/09 7353)  Complications: No complications documented.

## 2019-08-01 NOTE — Discharge Instructions (Addendum)
INSTRUCTIONS AFTER JOINT REPLACEMENT   o Remove items at home which could result in a fall. This includes throw rugs or furniture in walking pathways o ICE to the affected joint every three hours while awake for 30 minutes at a time, for at least the first 3-5 days, and then as needed for pain and swelling.  Continue to use ice for pain and swelling. You may notice swelling that will progress down to the foot and ankle.  This is normal after surgery.  Elevate your leg when you are not up walking on it.   o Continue to use the breathing machine you got in the hospital (incentive spirometer) which will help keep your temperature down.  It is common for your temperature to cycle up and down following surgery, especially at night when you are not up moving around and exerting yourself.  The breathing machine keeps your lungs expanded and your temperature down.   DIET:  As you were doing prior to hospitalization, we recommend a well-balanced diet.  DRESSING / WOUND CARE / SHOWERING  Keep the surgical dressing until follow up.  The dressing is water proof, so you can shower without any extra covering.  IF THE DRESSING FALLS OFF or the wound gets wet inside, change the dressing with sterile gauze.  Please use good hand washing techniques before changing the dressing.  Do not use any lotions or creams on the incision until instructed by your surgeon.    ACTIVITY  o Increase activity slowly as tolerated, but follow the weight bearing instructions below.   o No driving for 6 weeks or until further direction given by your physician.  You cannot drive while taking narcotics.  o No lifting or carrying greater than 10 lbs. until further directed by your surgeon. o Avoid periods of inactivity such as sitting longer than an hour when not asleep. This helps prevent blood clots.  o You may return to work once you are authorized by your doctor.     WEIGHT BEARING   Weight bearing as tolerated with assist  device (walker, cane, etc) as directed, use it as long as suggested by your surgeon or therapist, typically at least 4-6 weeks.   EXERCISES  Results after joint replacement surgery are often greatly improved when you follow the exercise, range of motion and muscle strengthening exercises prescribed by your doctor. Safety measures are also important to protect the joint from further injury. Any time any of these exercises cause you to have increased pain or swelling, decrease what you are doing until you are comfortable again and then slowly increase them. If you have problems or questions, call your caregiver or physical therapist for advice.   Rehabilitation is important following a joint replacement. After just a few days of immobilization, the muscles of the leg can become weakened and shrink (atrophy).  These exercises are designed to build up the tone and strength of the thigh and leg muscles and to improve motion. Often times heat used for twenty to thirty minutes before working out will loosen up your tissues and help with improving the range of motion but do not use heat for the first two weeks following surgery (sometimes heat can increase post-operative swelling).   These exercises can be done on a training (exercise) mat, on the floor, on a table or on a bed. Use whatever works the best and is most comfortable for you.    Use music or television while you are exercising so that   the exercises are a pleasant break in your day. This will make your life better with the exercises acting as a break in your routine that you can look forward to.   Perform all exercises about fifteen times, three times per day or as directed.  You should exercise both the operative leg and the other leg as well.  Exercises include:   . Quad Sets - Tighten up the muscle on the front of the thigh (Quad) and hold for 5-10 seconds.   . Straight Leg Raises - With your knee straight (if you were given a brace, keep it on),  lift the leg to 60 degrees, hold for 3 seconds, and slowly lower the leg.  Perform this exercise against resistance later as your leg gets stronger.  . Leg Slides: Lying on your back, slowly slide your foot toward your buttocks, bending your knee up off the floor (only go as far as is comfortable). Then slowly slide your foot back down until your leg is flat on the floor again.  . Angel Wings: Lying on your back spread your legs to the side as far apart as you can without causing discomfort.  . Hamstring Strength:  Lying on your back, push your heel against the floor with your leg straight by tightening up the muscles of your buttocks.  Repeat, but this time bend your knee to a comfortable angle, and push your heel against the floor.  You may put a pillow under the heel to make it more comfortable if necessary.   A rehabilitation program following joint replacement surgery can speed recovery and prevent re-injury in the future due to weakened muscles. Contact your doctor or a physical therapist for more information on knee rehabilitation.    CONSTIPATION  Constipation is defined medically as fewer than three stools per week and severe constipation as less than one stool per week.  Even if you have a regular bowel pattern at home, your normal regimen is likely to be disrupted due to multiple reasons following surgery.  Combination of anesthesia, postoperative narcotics, change in appetite and fluid intake all can affect your bowels.   YOU MUST use at least one of the following options; they are listed in order of increasing strength to get the job done.  They are all available over the counter, and you may need to use some, POSSIBLY even all of these options:    Drink plenty of fluids (prune juice may be helpful) and high fiber foods Colace 100 mg by mouth twice a day  Senokot for constipation as directed and as needed Dulcolax (bisacodyl), take with full glass of water  Miralax (polyethylene glycol)  once or twice a day as needed.  If you have tried all these things and are unable to have a bowel movement in the first 3-4 days after surgery call either your surgeon or your primary doctor.    If you experience loose stools or diarrhea, hold the medications until you stool forms back up.  If your symptoms do not get better within 1 week or if they get worse, check with your doctor.  If you experience "the worst abdominal pain ever" or develop nausea or vomiting, please contact the office immediately for further recommendations for treatment.   ITCHING:  If you experience itching with your medications, try taking only a single pain pill, or even half a pain pill at a time.  You can also use Benadryl over the counter for itching or also to   help with sleep.   TED HOSE STOCKINGS:  Use stockings on both legs until for at least 2 weeks or as directed by physician office. They may be removed at night for sleeping.  MEDICATIONS:  See your medication summary on the "After Visit Summary" that nursing will review with you.  You may have some home medications which will be placed on hold until you complete the course of blood thinner medication.  It is important for you to complete the blood thinner medication as prescribed.  PRECAUTIONS:  If you experience chest pain or shortness of breath - call 911 immediately for transfer to the hospital emergency department.   If you develop a fever greater that 101 F, purulent drainage from wound, increased redness or drainage from wound, foul odor from the wound/dressing, or calf pain - CONTACT YOUR SURGEON.                                                   FOLLOW-UP APPOINTMENTS:  If you do not already have a post-op appointment, please call the office for an appointment to be seen by your surgeon.  Guidelines for how soon to be seen are listed in your "After Visit Summary", but are typically between 1-4 weeks after surgery.  OTHER INSTRUCTIONS:   Knee  Replacement:  Do not place pillow under knee, focus on keeping the knee straight while resting.    DENTAL ANTIBIOTICS:  In most cases prophylactic antibiotics for Dental procdeures after total joint surgery are not necessary.  Exceptions are as follows:  1. History of prior total joint infection  2. Severely immunocompromised (Organ Transplant, cancer chemotherapy, Rheumatoid biologic meds such as Rembrandt)  3. Poorly controlled diabetes (A1C &gt; 8.0, blood glucose over 200)  If you have one of these conditions, contact your surgeon for an antibiotic prescription, prior to your dental procedure.   MAKE SURE YOU:  . Understand these instructions.  . Get help right away if you are not doing well or get worse.    Thank you for letting us be a part of your medical care team.  It is a privilege we respect greatly.  We hope these instructions will help you stay on track for a fast and full recovery!    Information on my medicine - XARELTO (Rivaroxaban)  This medication education was reviewed with me or my healthcare representative as part of my discharge preparation.  The pharmacist that spoke with me during my hospital stay was:    Why was Xarelto prescribed for you? Xarelto was prescribed for you to reduce the risk of blood clots forming after orthopedic surgery. The medical term for these abnormal blood clots is venous thromboembolism (VTE).  What do you need to know about xarelto ? Take your Xarelto ONCE DAILY at the same time every day. You may take it either with or without food.  If you have difficulty swallowing the tablet whole, you may crush it and mix in applesauce just prior to taking your dose.  Take Xarelto exactly as prescribed by your doctor and DO NOT stop taking Xarelto without talking to the doctor who prescribed the medication.  Stopping without other VTE prevention medication to take the place of Xarelto may increase your risk of developing a  clot.  After discharge, you should have regular check-up appointments with your healthcare provider  that is prescribing your Xarelto.    What do you do if you miss a dose? If you miss a dose, take it as soon as you remember on the same day then continue your regularly scheduled once daily regimen the next day. Do not take two doses of Xarelto on the same day.   Important Safety Information A possible side effect of Xarelto is bleeding. You should call your healthcare provider right away if you experience any of the following: ? Bleeding from an injury or your nose that does not stop. ? Unusual colored urine (red or dark brown) or unusual colored stools (red or black). ? Unusual bruising for unknown reasons. ? A serious fall or if you hit your head (even if there is no bleeding).  Some medicines may interact with Xarelto and might increase your risk of bleeding while on Xarelto. To help avoid this, consult your healthcare provider or pharmacist prior to using any new prescription or non-prescription medications, including herbals, vitamins, non-steroidal anti-inflammatory drugs (NSAIDs) and supplements.  This website has more information on Xarelto: [email or web address]

## 2019-08-01 NOTE — Anesthesia Procedure Notes (Signed)
Procedure Name: MAC Date/Time: 08/01/2019 10:31 AM Performed by: Niel Hummer, CRNA Pre-anesthesia Checklist: Patient identified, Emergency Drugs available, Suction available and Patient being monitored Oxygen Delivery Method: Simple face mask

## 2019-08-01 NOTE — Anesthesia Procedure Notes (Signed)
Spinal  Patient location during procedure: OR Start time: 08/01/2019 10:31 AM End time: 08/01/2019 10:35 AM Staffing Performed: resident/CRNA  Resident/CRNA: Niel Hummer, CRNA Preanesthetic Checklist Completed: patient identified, IV checked, risks and benefits discussed, surgical consent, monitors and equipment checked and pre-op evaluation Spinal Block Patient position: sitting Prep: DuraPrep Patient monitoring: heart rate, continuous pulse ox and blood pressure Approach: midline Location: L3-4 Injection technique: single-shot Needle Needle type: Pencan  Needle gauge: 25 G Needle length: 9 cm

## 2019-08-02 ENCOUNTER — Encounter (HOSPITAL_COMMUNITY): Payer: Self-pay | Admitting: Orthopedic Surgery

## 2019-08-02 DIAGNOSIS — M1611 Unilateral primary osteoarthritis, right hip: Secondary | ICD-10-CM | POA: Diagnosis not present

## 2019-08-02 LAB — CBC
HCT: 31.4 % — ABNORMAL LOW (ref 36.0–46.0)
Hemoglobin: 10.1 g/dL — ABNORMAL LOW (ref 12.0–15.0)
MCH: 33.3 pg (ref 26.0–34.0)
MCHC: 32.2 g/dL (ref 30.0–36.0)
MCV: 103.6 fL — ABNORMAL HIGH (ref 80.0–100.0)
Platelets: 201 10*3/uL (ref 150–400)
RBC: 3.03 MIL/uL — ABNORMAL LOW (ref 3.87–5.11)
RDW: 12 % (ref 11.5–15.5)
WBC: 9.3 10*3/uL (ref 4.0–10.5)
nRBC: 0 % (ref 0.0–0.2)

## 2019-08-02 LAB — BASIC METABOLIC PANEL
Anion gap: 9 (ref 5–15)
BUN: 13 mg/dL (ref 8–23)
CO2: 25 mmol/L (ref 22–32)
Calcium: 8.3 mg/dL — ABNORMAL LOW (ref 8.9–10.3)
Chloride: 105 mmol/L (ref 98–111)
Creatinine, Ser: 0.6 mg/dL (ref 0.44–1.00)
GFR calc Af Amer: >60 mL/min (ref >60)
GFR calc non Af Amer: >60 mL/min (ref >60)
Glucose, Bld: 149 mg/dL — ABNORMAL HIGH (ref 70–99)
Potassium: 4 mmol/L (ref 3.5–5.1)
Sodium: 139 mmol/L (ref 135–145)

## 2019-08-02 MED ORDER — RIVAROXABAN 10 MG PO TABS
10.0000 mg | ORAL_TABLET | Freq: Every day | ORAL | 0 refills | Status: DC
Start: 2019-08-02 — End: 2020-04-23

## 2019-08-02 MED ORDER — HYDROCODONE-ACETAMINOPHEN 7.5-325 MG PO TABS: 1.0000 | ORAL_TABLET | ORAL | 0 refills | Status: DC | PRN

## 2019-08-02 MED ORDER — METHOCARBAMOL 500 MG PO TABS: 500.0000 mg | ORAL_TABLET | Freq: Four times a day (QID) | ORAL | 0 refills | Status: DC | PRN

## 2019-08-02 MED ORDER — DOCUSATE SODIUM 100 MG PO CAPS
100.0000 mg | ORAL_CAPSULE | Freq: Two times a day (BID) | ORAL | 0 refills | Status: DC
Start: 2019-08-02 — End: 2019-09-26

## 2019-08-02 MED ORDER — POLYETHYLENE GLYCOL 3350 17 G PO PACK
17.0000 g | PACK | Freq: Two times a day (BID) | ORAL | 0 refills | Status: DC
Start: 2019-08-02 — End: 2020-04-23

## 2019-08-02 MED ORDER — FERROUS SULFATE 325 (65 FE) MG PO TABS: 325.0000 mg | ORAL_TABLET | Freq: Three times a day (TID) | ORAL | 0 refills | Status: DC

## 2019-08-02 NOTE — Progress Notes (Signed)
Physical Therapy Treatment Patient Details Name: Tonya Bartlett MRN: [id number] DOB: 1945/10/20 Today's Date: 08/02/2019    History of Present Illness R AA-THA 08/01/19. PMH L ulnar nerve transplant, B TKA, L THA, R RCR    PT Comments    Pt ambulated 125' with RW, completed stair training, and demonstrates good understanding of HEP. She is ready to DC home from PT standpoint.   Follow Up Recommendations  Follow surgeon's recommendation for DC plan and follow-up therapies     Equipment Recommendations  None recommended by PT    Recommendations for Other Services       Precautions / Restrictions Precautions Precautions: Fall Restrictions Weight Bearing Restrictions: No Other Position/Activity Restrictions: WBAT    Mobility  Bed Mobility Overal bed mobility: Modified Independent       Supine to sit: Modified independent (Device/Increase time)     General bed mobility comments: used rail, HOB up  Transfers Overall transfer level: Modified independent Equipment used: Rolling walker (2 wheeled) Transfers: Sit to/from Stand Sit to Stand: Modified independent (Device/Increase time)            Ambulation/Gait Ambulation/Gait assistance: Supervision Gait Distance (Feet): 125 Feet Assistive device: Rolling walker (2 wheeled) Gait Pattern/deviations: Step-to pattern;Decreased step length - right;Decreased step length - left     General Gait Details: VCs sequencing, distance limited by nausea   Stairs Stairs: Yes Stairs assistance: Supervision Stair Management: One rail Left;Forwards;Step to pattern;With cane Number of Stairs: 3 General stair comments: VCs sequencing   Wheelchair Mobility    Modified Rankin (Stroke Patients Only)       Balance Overall balance assessment: (P) Modified Independent                                          Cognition Arousal/Alertness: Awake/alert Behavior During Therapy: WFL for tasks  assessed/performed Overall Cognitive Status: Within Functional Limits for tasks assessed                                        Exercises Total Joint Exercises Ankle Circles/Pumps: AROM;Both;10 reps;Supine Quad Sets: AROM;Both;10 reps;Supine Short Arc Quad: AROM;Right;10 reps;Supine Heel Slides: AAROM;Right;10 reps;Supine Hip ABduction/ADduction: AAROM;Right;10 reps;Supine    General Comments        Pertinent Vitals/Pain Pain Score: 7  Pain Location: R hip Pain Descriptors / Indicators: Burning Pain Intervention(s): Limited activity within patient's tolerance;Monitored during session;Premedicated before session;Ice applied    Home Living                      Prior Function            PT Goals (current goals can now be found in the care plan section) Acute Rehab PT Goals Patient Stated Goal: go to [facility] in Sept PT Goal Formulation: With patient Time For Goal Achievement: 08/08/19 Potential to Achieve Goals: Good Progress towards PT goals: Goals met/education completed, patient discharged from PT    Frequency    7X/week      PT Plan Current plan remains appropriate    Co-evaluation              AM-PAC PT "6 Clicks" Mobility   Outcome Measure  Help needed turning from your back to your side while in a flat bed without using  bedrails?: None Help needed moving from lying on your back to sitting on the side of a flat bed without using bedrails?: None Help needed moving to and from a bed to a chair (including a wheelchair)?: None Help needed standing up from a chair using your arms (e.g., wheelchair or bedside chair)?: None Help needed to walk in hospital room?: None Help needed climbing 3-5 steps with a railing? : None 6 Click Score: 24    End of Session Equipment Utilized During Treatment: Gait belt Activity Tolerance: Patient tolerated treatment well;Treatment limited secondary to medical complications (Comment)  (nausea) Patient left: in chair;with call bell/phone within reach;with chair alarm set Nurse Communication: Mobility status PT Visit Diagnosis: Pain;Muscle weakness (generalized) (M62.81);Difficulty in walking, not elsewhere classified (R26.2) Pain - Right/Left: Right Pain - part of body: Hip     Time: 1448-1856 PT Time Calculation (min) (ACUTE ONLY): 33 min  Charges:  $Gait Training: 8-22 mins $Therapeutic Exercise: 8-22 mins                     Blondell Reveal Kistler PT 08/02/2019  Acute Rehabilitation Services Pager [phone] Office [phone]

## 2019-08-02 NOTE — Plan of Care (Signed)
Patient discharged home in stable condition 

## 2019-08-02 NOTE — Progress Notes (Signed)
     Subjective: 1 Day Post-Op Procedure(s) (LRB): TOTAL HIP ARTHROPLASTY ANTERIOR APPROACH (Right)   Patient reports pain as mild, pain controlled. Pain was a little increased this morning, but better with medications.  No reported events throughout the night.  Discussed the procedure, findings and expectations moving forward.  Patient is ready be discharged home, she does well therapy.  She will follow up in the clinic in 2 weeks.  Patient does know to call with any questions or concerns.     Objective:   VITALS:   Vitals:   08/02/19 0112 08/02/19 0515  BP: 139/70 139/63  Pulse: (!) 51 (!) 55  Resp: 18 18  Temp: 97.9 F (36.6 C) 98.2 F (36.8 C)  SpO2: 98% 98%    Dorsiflexion/Plantar flexion intact Incision: dressing C/D/I No cellulitis present Compartment soft  LABS Recent Labs    08/02/19 0306  HGB 10.1*  HCT 31.4*  WBC 9.3  PLT 201    Recent Labs    08/02/19 0306  NA 139  K 4.0  BUN 13  CREATININE 0.60  GLUCOSE 149*     Assessment/Plan: 1 Day Post-Op Procedure(s) (LRB): TOTAL HIP ARTHROPLASTY ANTERIOR APPROACH (Right) Foley cath d/c'ed Advance diet Up with therapy D/C IV fluids Discharge home Follow up in 2 weeks at [facility] Follow up with OLIN,Lilo Wallington D in 2 weeks.  Contact information:  [facility] [address], [address] [place] [phone]    Obese (BMI 30-39.9) Estimated body mass index is 32.35 kg/m as calculated from the following:   Height as of this encounter: 5\' 5"  (1.651 m).   Weight as of this encounter: 88.2 kg. Patient also counseled that weight may inhibit the healing process Patient counseled that losing weight will help with future health issues     Danae Orleans PA-C  [facility]  [place] [address], [place], [place] [address] Phone: [phone] [email or web address] Facebook  [employer]

## 2019-08-03 NOTE — Discharge Summary (Signed)
Patient ID: Tonya Bartlett MRN: [id number] DOB/AGE: 02-20-1945 74 y.o.  Admit date: 08/01/2019 Discharge date: 08/03/2019  Admission Diagnoses:  Principal Problem:   Right hip OA Active Problems:   Obese   Status post right hip replacement   Discharge Diagnoses:  Same  Past Medical History:  Diagnosis Date  . Allergic rhinitis   . Colitis   . Complication of anesthesia    vomit x1 while on Morphine per pt  . Depression   . Diverticulosis   . Frequency of urination   . GERD (gastroesophageal reflux disease)   . History of colon polyps   . 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 History of hiatal hernia   . History of iron deficiency anemia 1996   iron infusion  . History of migraine headaches   . History of rib fracture   . Hyperlipidemia   . IBS (irritable bowel syndrome)   . Insomnia   . MCI (mild cognitive impairment)   . Melanoma ([place]) 2019   left leg   . Migraine headache    hx of migraines   . OA (osteoarthritis)    RIGHT SHOULDER  . Pneumonia    remote history  . PONV (postoperative nausea and vomiting)   . Recovering alcoholic ([place])    SINCE [id number]  . Right rotator cuff tear   . Unspecified essential hypertension     Surgeries: Procedure(s):  RIGHT TOTAL HIP ARTHROPLASTY ANTERIOR APPROACH on 08/01/2019   Consultants: N/A  Discharged Condition: Improved  Hospital Course: Tonya Bartlett is an 74 y.o. female who was admitted 08/01/2019 for operative treatment ofOsteoarthritis of right hip. Patient has severe unremitting pain that affects sleep, daily activities, and work/hobbies. After pre-op clearance the patient was taken to the operating room on 08/01/2019 and underwent  Procedure(s):  RIGHT TOTAL HIP ARTHROPLASTY ANTERIOR APPROACH.    Patient was given perioperative antibiotics:  Anti-infectives (From admission, onward)   Start     Dose/Rate Route Frequency Ordered Stop   08/01/19 2200  vancomycin (VANCOCIN) IVPB  1000 mg/200 mL premix        1,000 mg 200 mL/hr over 60 Minutes Intravenous Every 12 hours 08/01/19 1423 08/01/19 2309   08/01/19 0915  vancomycin (VANCOCIN) IVPB 1000 mg/200 mL premix        1,000 mg 200 mL/hr over 60 Minutes Intravenous On call to O.R. 08/01/19 0904 08/01/19 1047   08/01/19 0904  gentamicin (GARAMYCIN) 5 mg/kg in dextrose 5 % 100 mL IVPB  Status:  Discontinued        5 mg/kg (Adjusted) 100 mL/hr over 60 Minutes Intravenous Every 24 hours 08/01/19 0904 08/01/19 0916   08/01/19 0600  gentamicin (GARAMYCIN) 350 mg in dextrose 5 % 100 mL IVPB        5 mg/kg  70.3 kg (Adjusted) 217.5 mL/hr over 30 Minutes Intravenous 30 min pre-op 07/31/19 0823 08/01/19 1105       Patient was given sequential compression devices, early ambulation, and chemoprophylaxis to prevent DVT.  Patient benefited maximally from hospital stay and there were no complications.    Recent vital signs: No data found.   Recent laboratory studies:  Recent Labs    08/02/19 0306  WBC 9.3  HGB 10.1*  HCT 31.4*  PLT 201  NA 139  K 4.0  CL 105  CO2 25  BUN 13  CREATININE 0.60  GLUCOSE 149*  CALCIUM 8.3*  Discharge Medications:   Allergies as of 08/02/2019      Reactions   Otezla [apremilast] Anaphylaxis   Suicidal ideation   Penicillins Anaphylaxis   Has patient had a PCN reaction causing immediate rash, facial/tongue/throat swelling, SOB or lightheadedness with hypotension: Yes Has patient had a PCN reaction causing severe rash involving mucus membranes or skin necrosis: Yes Has patient had a PCN reaction that required hospitalization: No Has patient had a PCN reaction occurring within the last 10 year No If all of the above answers are "NO", then may proceed with Cephalosporin use.   Erythromycin Other (See Comments)   SEVERE STOMACH CRAMPS   Morphine And Related Nausea And Vomiting   Nsaids Other (See Comments)   SEVERE STOMACH CRAMPS, MOUTH SORES **Able to tolerate Tylenol    Remicade [infliximab] Other (See Comments)   Shut down immune system-BP high   Tolmetin    SEVERE STOMACH CRAMPS   Nickel Rash   Including snaps on hospital gowns       Medication List    STOP taking these medications   acetaminophen 500 MG tablet Commonly known as: TYLENOL   HYDROcodone-acetaminophen 5-325 MG tablet Commonly known as: NORCO/VICODIN Replaced by: HYDROcodone-acetaminophen 7.5-325 MG tablet     TAKE these medications   atorvastatin 20 MG tablet Commonly known as: LIPITOR Take 1 tablet (20 mg total) by mouth daily at 6 PM.   betamethasone dipropionate 0.05 % lotion Apply topically daily. What changed:   how much to take  when to take this  reasons to take this   CAL-MAG-ZINC PO Take 1 tablet by mouth every evening.   docusate sodium 100 MG capsule Commonly known as: Colace Take 1 capsule (100 mg total) by mouth 2 (two) times daily.   escitalopram 10 MG tablet Commonly known as: Lexapro Take 1 tablet (10 mg total) by mouth daily.   famotidine 20 MG tablet Commonly known as: Pepcid Take one tablet (20mg ) once to twice a day as needed What changed:   how much to take  how to take this  when to take this  additional instructions   ferrous sulfate 325 (65 FE) MG tablet Commonly known as: FerrouSul Take 1 tablet (325 mg total) by mouth 3 (three) times daily with meals for 14 days.   FIBER PO Take 1 capsule by mouth in the morning and at bedtime.   folic acid 1 MG tablet Commonly known as: FOLVITE Take 1 mg by mouth daily.   gabapentin 100 MG capsule Commonly known as: Neurontin Take 1 capsule (100 mg total) by mouth 3 (three) times daily as needed. What changed: when to take this   HYDROcodone-acetaminophen 7.5-325 MG tablet Commonly known as: Norco Take 1-2 tablets by mouth every 4 (four) hours as needed for moderate pain. Replaces: HYDROcodone-acetaminophen 5-325 MG tablet   hydrocortisone cream 1 % Apply 1 application  topically daily as needed for itching.   losartan 50 MG tablet Commonly known as: COZAAR TAKE 1 TABLET(50 MG) BY MOUTH DAILY What changed:   how much to take  how to take this  when to take this  additional instructions   methocarbamol 500 MG tablet Commonly known as: Robaxin Take 1 tablet (500 mg total) by mouth every 6 (six) hours as needed for muscle spasms.   polyethylene glycol 17 g packet Commonly known as: MIRALAX / GLYCOLAX Take 17 g by mouth 2 (two) times daily.   rivaroxaban 10 MG Tabs tablet Commonly known as: Xarelto Take 1  tablet (10 mg total) by mouth daily.   sulfamethoxazole-trimethoprim 400-80 MG tablet Commonly known as: BACTRIM Take 1 tablet by mouth daily. For urethritis.   SUMAtriptan 100 MG tablet Commonly known as: IMITREX Take 1 tablet (100 mg total) by mouth every 2 (two) hours as needed for migraine. May repeat in 2 hours if headache persists or recurs.   traZODone 50 MG tablet Commonly known as: DESYREL Take 1 tablet (50 mg total) by mouth at bedtime.   Vitamin D3 125 MCG (5000 UT) Tabs Take 5,000 Units by mouth every evening.            Discharge Care Instructions  (From admission, onward)         Start     Ordered   08/02/19 0000  Change dressing       Comments: Maintain surgical dressing until follow up in the clinic. If the edges start to pull up, may reinforce with tape. If the dressing is no longer working, may remove and cover with gauze and tape, but must keep the area dry and clean.  Call with any questions or concerns.   08/02/19 6203          Diagnostic Studies: DG Pelvis Portable  Result Date: 08/01/2019 CLINICAL DATA:  Right hip replacement. EXAM: PORTABLE PELVIS 1-2 VIEWS COMPARISON:  08/01/2019. FINDINGS: Total right hip replacement. Hardware intact. Anatomic alignment. Prior total left hip replacement. IMPRESSION: Total right hip replacement with anatomic alignment. Electronically Signed   By: Marcello Moores  Register    On: 08/01/2019 13:01   DG C-Arm 1-60 Min-No Report  Result Date: 08/01/2019 Fluoroscopy was utilized by the requesting physician.  No radiographic interpretation.   DG HIP OPERATIVE UNILAT W OR W/O PELVIS RIGHT  Result Date: 08/01/2019 CLINICAL DATA:  Right hip replacement EXAM: OPERATIVE RIGHT HIP (WITH PELVIS IF PERFORMED) 10 VIEWS TECHNIQUE: Fluoroscopic spot image(s) were submitted for interpretation post-operatively. COMPARISON:  No recent. FINDINGS: Replacement with anatomic alignment.  Hardware intact IMPRESSION: Total right hip replacement with anatomic alignment. Electronically Signed   By: [place]   On: 08/01/2019 12:14    Disposition: Discharge disposition: 01-Home or Self Care       Discharge Instructions    Call MD / Call 911   Complete by: As directed    If you experience chest pain or shortness of breath, CALL 911 and be transported to the hospital emergency room.  If you develope a fever above 101 F, pus (white drainage) or increased drainage or redness at the wound, or calf pain, call your surgeon's office.   Change dressing   Complete by: As directed    Maintain surgical dressing until follow up in the clinic. If the edges start to pull up, may reinforce with tape. If the dressing is no longer working, may remove and cover with gauze and tape, but must keep the area dry and clean.  Call with any questions or concerns.   Constipation Prevention   Complete by: As directed    Drink plenty of fluids.  Prune juice may be helpful.  You may use a stool softener, such as Colace (over the counter) 100 mg twice a day.  Use MiraLax (over the counter) for constipation as needed.   Diet - low sodium heart healthy   Complete by: As directed    Discharge instructions   Complete by: As directed    Maintain surgical dressing until follow up in the clinic. If the edges start to pull up, may  reinforce with tape. If the dressing is no longer working, may remove and cover with  gauze and tape, but must keep the area dry and clean.  Follow up in 2 weeks at [facility]. Call with any questions or concerns.   Increase activity slowly as tolerated   Complete by: As directed    Weight bearing as tolerated with assist device (walker, cane, etc) as directed, use it as long as suggested by your surgeon or therapist, typically at least 4-6 weeks.   TED hose   Complete by: As directed    Use stockings (TED hose) for 2 weeks on both leg(s).  You may remove them at night for sleeping.       Follow-up Information    Norman Herrlich. Go on 08/16/2019.   Specialty: Orthopedic Surgery Why: You are scheduled for a post-operative appointment on 08-16-19 at 3:45 pm.  Contact information: [address] [place] [address] [phone]                Signed: Lucille Passy [facility] 08/03/2019, 11:14 AM

## 2019-08-06 ENCOUNTER — Other Ambulatory Visit: Payer: Self-pay | Admitting: Adult Health

## 2019-08-06 DIAGNOSIS — G4700 Insomnia, unspecified: Secondary | ICD-10-CM

## 2019-08-08 NOTE — Telephone Encounter (Signed)
SENT TO THE PHARMACY BY E-SCRIBE. 

## 2019-08-14 ENCOUNTER — Telehealth: Payer: Self-pay | Admitting: Adult Health

## 2019-08-14 NOTE — Progress Notes (Signed)
  Chronic Care Management   Note  08/14/2019 Name: Tonya Bartlett MRN: [id number] DOB: 1945/05/14  Tonya Bartlett is a 74 y.o. year old female who is a primary care patient of [name], NP. I reached out to [place] by phone today in response to a referral sent by Tonya Bartlett's PCP, [name], NP.   Tonya Bartlett was given information about Chronic Care Management services today including:  1. CCM service includes personalized support from designated clinical staff supervised by her physician, including individualized plan of care and coordination with other care providers 2. 24/7 contact phone numbers for assistance for urgent and routine care needs. 3. Service will only be billed when office clinical staff spend 20 minutes or more in a month to coordinate care. 4. Only one practitioner may furnish and bill the service in a calendar month. 5. The patient may stop CCM services at any time (effective at the end of the month) by phone call to the office staff.   Patient agreed to services and verbal consent obtained.   Follow up plan:   [place]

## 2019-08-17 DIAGNOSIS — H8112 Benign paroxysmal vertigo, left ear: Secondary | ICD-10-CM | POA: Diagnosis not present

## 2019-08-17 DIAGNOSIS — R42 Dizziness and giddiness: Secondary | ICD-10-CM | POA: Diagnosis not present

## 2019-08-17 DIAGNOSIS — H9012 Conductive hearing loss, unilateral, left ear, with unrestricted hearing on the contralateral side: Secondary | ICD-10-CM | POA: Diagnosis not present

## 2019-08-29 DIAGNOSIS — I10 Essential (primary) hypertension: Secondary | ICD-10-CM | POA: Diagnosis not present

## 2019-08-29 DIAGNOSIS — Z8249 Family history of ischemic heart disease and other diseases of the circulatory system: Secondary | ICD-10-CM | POA: Diagnosis not present

## 2019-08-31 ENCOUNTER — Ambulatory Visit: Payer: Medicare Other | Admitting: Neurology

## 2019-08-31 ENCOUNTER — Encounter: Payer: Self-pay | Admitting: Neurology

## 2019-08-31 ENCOUNTER — Telehealth: Payer: Self-pay | Admitting: Neurology

## 2019-08-31 NOTE — Telephone Encounter (Signed)
Pt was a no show for the follow up visit scheduled today.

## 2019-08-31 NOTE — Telephone Encounter (Addendum)
error 

## 2019-09-07 DIAGNOSIS — I10 Essential (primary) hypertension: Secondary | ICD-10-CM | POA: Diagnosis not present

## 2019-09-07 DIAGNOSIS — Z8249 Family history of ischemic heart disease and other diseases of the circulatory system: Secondary | ICD-10-CM | POA: Diagnosis not present

## 2019-09-13 DIAGNOSIS — Z96641 Presence of right artificial hip joint: Secondary | ICD-10-CM | POA: Diagnosis not present

## 2019-09-13 DIAGNOSIS — Z471 Aftercare following joint replacement surgery: Secondary | ICD-10-CM | POA: Diagnosis not present

## 2019-09-22 ENCOUNTER — Other Ambulatory Visit: Payer: Self-pay | Admitting: Adult Health

## 2019-09-22 DIAGNOSIS — F3341 Major depressive disorder, recurrent, in partial remission: Secondary | ICD-10-CM

## 2019-09-22 DIAGNOSIS — E782 Mixed hyperlipidemia: Secondary | ICD-10-CM

## 2019-09-22 DIAGNOSIS — I10 Essential (primary) hypertension: Secondary | ICD-10-CM

## 2019-09-26 ENCOUNTER — Ambulatory Visit (INDEPENDENT_AMBULATORY_CARE_PROVIDER_SITE_OTHER): Payer: Medicare Other | Admitting: Neurology

## 2019-09-26 ENCOUNTER — Encounter: Payer: Self-pay | Admitting: Family Medicine

## 2019-09-26 VITALS — BP 131/81 | HR 66 | Ht 66.5 in | Wt 188.0 lb

## 2019-09-26 DIAGNOSIS — G3184 Mild cognitive impairment, so stated: Secondary | ICD-10-CM | POA: Diagnosis not present

## 2019-09-26 DIAGNOSIS — G2581 Restless legs syndrome: Secondary | ICD-10-CM | POA: Diagnosis not present

## 2019-09-26 MED ORDER — CEREFOLIN 6-1-50-5 MG PO TABS: 1.0000 | ORAL_TABLET | ORAL | 3 refills | Status: DC

## 2019-09-26 MED ORDER — GABAPENTIN 300 MG PO CAPS: 300.0000 mg | ORAL_CAPSULE | Freq: Two times a day (BID) | ORAL | 11 refills | Status: DC

## 2019-09-26 NOTE — Patient Instructions (Signed)
I had a long discussion with the patient with regards to her mild short-term memory and word finding difficulties which appear to be stable recommend she continue Cerefolin NAC 1 tablet daily for that as well as increase participation in cognitively challenging activities like solving crossword puzzles, playing bridge and sodoku.  She also has involuntary right leg movements likely from restless leg syndrome which did not appear to have responded to current dose of gabapentin hence I recommend she increase it to 300 mg twice daily.  If this is not effective may consider switching to Requip or Lyrica in the future.  She will return for follow-up in a year or call earlier if necessary.

## 2019-09-26 NOTE — Progress Notes (Signed)
[facility] [address] [place]. [place] [address] [phone]       OFFICE FOLLOW UP VISIT NOTE  Ms. Noel Christmas Hundal Date of Birth:  05/25/1945 Medical Record Number:  [id number]   Referring MD:  [name], NP  Reason for Referral:  Memory loss HPI: Initial Consult 09/19/15 :  Ms Rolison is a 27 year Caucasian lady who for the last year and a half has been having mild memory and cognitive difficulties. She often misplaces objects but can remember later. She is also having word finding issues and trouble getting her words out. At times she has had trouble driving to places that she has driven down  To numerous times before and yet for a few seconds cannot remember where it done. She is also forgetting names of the patient's. 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 There is no history of strokes seizures significant head injury with loss of consciousness. There is no family history of Alzheimer's dementia. Patient has not had any brain imaging studies done on November done for reversible causes of cognitive impairment. She denies feeling depressed. Past medical history significant for irritable bowel syndrome and syncopal episodes. Update 12/09/2015 : She returns for follow-up after last visit 2 and half months ago. She continues from mild short-term memory difficulties but these appear to be stable and not getting worse. Patient did not fill the prescription for self: She forgot. She had MRI scan the brain done on 10/09/2015 which I personally reviewed and shows only mild changes of chronic microvascular ischemia without any structural lesions tumor infarcts. Lab work done on 8/ 3/17 showed normal vitamin B12, TSH and RPR. EEG done on 10/24/15 was normal. Patient was interested in participating in the [facility] early  dementia study but she does not have a reliable caregiver to help her participate. She states he is taking Celexa and her depression is well controlled. She has no new complaints today. Update 06/08/2016 : She returns for followup after last visit 6 months ago. She states she continues to have mi and cognitive difficulties which are however unchanged. She has trouble finding words occasionally and completing sentences. She has been taking Cerefolin nac and find it helps.  She does play solitaire but does not participate in any other cognitively challenging activities and states her job is quite taxing and stressful. She has no neurological complaints today.Marland Kitchen Update 08/30/2017 : She returns for follow-up after last visit more than a year ago. She continues to do well and states she has not had any worsening of her memory or cognitive difficulties. She continues to have short-term memory difficulties. 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 She also play solitaire a lot. 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 She is recovered from that and is now doing a lot of swimming to help her. She feels her depression has gotten worse and despite being on Wellbutrin. She plans to discuss with primary physician referral to a psychiatrist. Denies any other neurological complaints. She remains on; Cerefolin NAC which is tolerating well. Mini-Mental status exam score today was 30/30. Update 09/01/2018 : She returns for follow-up after last visit a year ago.  She states she is doing well.  She feels her memory difficulties in fact may be slightly better.  She remains on Cerefolin NAC which she takes regularly and needs a refill.  She still gets confused easily.  She often misses appointment.  In fact today she called stating that she would not be able to do  her video visit due to technical difficulties and was switched to in person visit.  Her son now lives with her and she enjoys having him.  Her daughter also spends a lot of time with her and comes over frequently.  She does play bridge.  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  Her blood pressures well controlled and today it is 122/66.  She is still bothered by bad hip and is planning to get a steroid injection in the next week. Update 01/18/2019 ; she returns for follow-up after last visit for a number of months ago.  She states she discontinued Cerefolin as she felt the cost was too much.  She feels it may have been helping her and short-term memory may have gotten worse.  She now wants to restart Cerefolin and is asking for a prescription.  She is mostly retired but does do some work mostly from home over the phone.  She continues to have mild short-term memory and cognitive difficulties but these do not appear to be progressing.  She has new complaint today of restless right foot.  She has a constant urge to move it and wiggles her foot constantly when she is sitting and not moving.  She is able to sleep well at night but does take trazodone which helps her sleep. Update 09/26/2019: She returns for follow-up after last visit 8 months ago.  She missed an appointment last month.  She states she continues to have mild short-term memory difficulties as well as occasional word finding difficulties but is able to remember them later.  This is unchanged now for the last couple of years.  She is recently retired from a job.  Patient was taking Cerefolin in the past but try to discontinue it as it is too expensive but noticed that her symptoms are getting worse so she has recently restarted taking it.  She continues to have involuntary movement in the right leg and she was unable to tolerate gabapentin 100 mg 3 times daily and is only on twice daily now but feels she can tolerate  better but it is not helping as much.  She underwent right hip replacement 2 months ago and is able to ambulate with a cane but can walk short distances without it.  On Mini-Mental status exam testing today she scored 29/30 and was able to name 15 animals which can walk on 4 legs.  This is unchanged from last visit ROS:   14 system review of systems is positive for  restless legs, right leg involuntary movement.,  Right hip pain.  Memory loss, word finding difficulty walking difficulty,  and all other systems negative  PMH:  Past Medical History:  Diagnosis Date  . Allergic rhinitis   . Colitis   . Complication of anesthesia    vomit x1 while on Morphine per pt  . Depression   . Diverticulosis   . Frequency of urination   . GERD (gastroesophageal reflux disease)   . History of colon polyps   . 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  History of hiatal hernia   . History of iron deficiency anemia 1996   iron infusion  . History of migraine headaches   . History of rib fracture   . Hyperlipidemia   . IBS (irritable bowel syndrome)   . Insomnia   . MCI (mild cognitive impairment)   . Melanoma ([place]) 2019   left leg   . Migraine headache    hx of migraines   . OA (osteoarthritis)    RIGHT SHOULDER  . Pneumonia    remote history  . PONV (postoperative nausea and vomiting)   . Recovering alcoholic ([place])    SINCE [id number]  . Right rotator cuff tear   . Unspecified essential hypertension     Social History:  Social History   Socioeconomic History  . Marital status: Widowed    Spouse name: Not on file  . Number of children: Not on file  . Years of education: Not on file  . Highest education level: Master's degree (e.g., MA, MS, MEng, MEd, MSW, MBA)  Occupational History  . Not on file  Tobacco Use  . Smoking status: Former Smoker    Packs/day: 0.50    Years: 15.00    Pack years: 7.50    Types: Cigarettes    Quit date: 05/15/1985    Years  since quitting: 34.3  . Smokeless tobacco: Never Used  Vaping Use  . Vaping Use: Never used  Substance and Sexual Activity  . Alcohol use: No    Comment: RECOVERING ALCOHOLIC--  QUIT [id number]  . Drug use: No  . Sexual activity: Never    Birth control/protection: Post-menopausal  Other Topics Concern  . 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 Difficulty of Paying Living Expenses:   Food Insecurity:   . Worried About Charity fundraiser in the Last Year:   . Arboriculturist in the Last Year:   Transportation Needs:   . Film/video editor (Medical):   Marland Kitchen Lack of Transportation (Non-Medical):   Physical Activity:   . Days of Exercise per Week:   . Minutes of Exercise per Session:   Stress:   . Feeling of Stress :   Social Connections:   . Frequency of Communication with Friends and Family:   . Frequency of Social Gatherings with Friends and Family:   . Attends Religious Services:   . Active Member of Clubs or Organizations:   . Attends Archivist Meetings:   Marland Kitchen Marital Status:   Intimate Partner Violence:   . Fear of Current or Ex-Partner:   . Emotionally Abused:   Marland Kitchen Physically Abused:   . Sexually Abused:     Medications:   Current Outpatient Medications on File Prior to Visit  Medication Sig Dispense Refill  . atorvastatin (LIPITOR) 20 MG tablet Take 1 tablet (20 mg total) by mouth daily at 6 PM. 90 tablet 3  . betamethasone dipropionate 0.05 % lotion Apply topically daily. (Patient taking differently: Apply 1 application topically every three (3) days as needed (scalp irritation.). ) 60 mL 2  . Calcium-Magnesium-Zinc (CAL-MAG-ZINC PO) Take 1 tablet by mouth every evening.    . Cholecalciferol (VITAMIN D3) 125 MCG (5000 UT) TABS Take 5,000 Units by mouth  every  evening.    . escitalopram (LEXAPRO) 10 MG tablet Take 1 tablet (10 mg total) by mouth daily. 90 tablet 0  . famotidine (PEPCID) 20 MG tablet Take one tablet (20mg ) once to twice a day as needed (Patient taking differently: Take 20 mg by mouth daily. ) 180 tablet 3  . FIBER PO Take 1 capsule by mouth in the morning and at bedtime.    Marland Kitchen HYDROcodone-acetaminophen (NORCO) 7.5-325 MG tablet Take 1-2 tablets by mouth every 4 (four) hours as needed for moderate pain. 60 tablet 0  . hydrocortisone cream 1 % Apply 1 application topically daily as needed for itching.    . losartan (COZAAR) 50 MG tablet TAKE 1 TABLET(50 MG) BY MOUTH DAILY (Patient taking differently: Take 50 mg by mouth daily. ) 90 tablet 3  . polyethylene glycol (MIRALAX / GLYCOLAX) 17 g packet Take 17 g by mouth 2 (two) times daily. 28 packet 0  . rivaroxaban (XARELTO) 10 MG TABS tablet Take 1 tablet (10 mg total) by mouth daily. 14 tablet 0  . sulfamethoxazole-trimethoprim (BACTRIM,SEPTRA) 400-80 MG tablet Take 1 tablet by mouth daily. For urethritis.     . SUMAtriptan (IMITREX) 100 MG tablet Take 1 tablet (100 mg total) by mouth every 2 (two) hours as needed for migraine. May repeat in 2 hours if headache persists or recurs. 10 tablet 0  . traZODone (DESYREL) 50 MG tablet TAKE 1 TABLET(50 MG) BY MOUTH AT BEDTIME 90 tablet 1  . ferrous sulfate (FERROUSUL) 325 (65 FE) MG tablet Take 1 tablet (325 mg total) by mouth 3 (three) times daily with meals for 14 days. 42 tablet 0  . methocarbamol (ROBAXIN) 500 MG tablet Take 1 tablet (500 mg total) by mouth every 6 (six) hours as needed for muscle spasms. (Patient not taking: Reported on 09/26/2019) 40 tablet 0   No current facility-administered medications on file prior to visit.    Allergies:   Allergies  Allergen Reactions  . [place] Nail [Apremilast] Anaphylaxis    Suicidal ideation  . Penicillins Anaphylaxis    Has patient had a PCN reaction causing immediate rash, facial/tongue/throat  swelling, SOB or lightheadedness with hypotension: Yes Has patient had a PCN reaction causing severe rash involving mucus membranes or skin necrosis: Yes Has patient had a PCN reaction that required hospitalization: No Has patient had a PCN reaction occurring within the last 10 year No If all of the above answers are "NO", then may proceed with Cephalosporin use.   . Erythromycin Other (See Comments)    SEVERE STOMACH CRAMPS  . Morphine And Related Nausea And Vomiting  . Nsaids Other (See Comments)    SEVERE STOMACH CRAMPS, MOUTH SORES **Able to tolerate Tylenol  . Remicade [Infliximab] Other (See Comments)    Shut down immune system-BP high  . Tolmetin     SEVERE STOMACH CRAMPS  . Nickel Rash    Including snaps on hospital gowns     Physical Exam General: well developed, well nourished elderly Caucasian lady, seated, in no evident distress Head: head normocephalic and atraumatic.   Neck: supple with no carotid or supraclavicular bruits Cardiovascular: regular rate and rhythm, no murmurs Musculoskeletal: no deformity Skin:  no rash/petichiae Vascular:  Normal pulses all extremities  Neurologic Exam Mental Status: Awake and fully alert. Oriented to place and time. Recent and remote memory intact. Attention span, concentration and fund of knowledge appropriate. Mood and affect appropriate. Mini-Mental status exam 29/30 with only 1 deficit in orientation..  Recall 3/3.Marland Kitchen Marland Kitchen  Able to name 15 four legged animals. Clock drawing 4/4. Able to copy intersecting pentagons quite well. Cranial Nerves: Fundoscopic exam not done. Pupils equal, briskly reactive to light. Extraocular movements full without nystagmus. Visual fields full to confrontation. Hearing intact. Facial sensation intact. Face, tongue, palate moves normally and symmetrically.  Motor: Normal bulk and tone. Normal strength in all tested extremity muscles.  Intermittent involuntary right leg leg involuntary movements with twisting  and turning of the right leg and toes. Sensory.: intact to touch , pinprick , position and vibratory sensation.  Coordination: Rapid alternating movements normal in all extremities. Finger-to-nose and heel-to-shin performed accurately bilaterally. Gait and Station: Arises from chair without difficulty. Stance is normal. Gait demonstrates slight favoring of the right hip due to pain.  Unable to tandem walk without difficulty  reflexes: 1+ and symmetric. Toes downgoing.       ASSESSMENT: 21 year Caucasian lady with mild memory and cognitive difficulties likely due to mild cognitive impairment which appears stable.. Right leg involuntary movements likely due to restless leg syndrome    PLAN: I had a long discussion with the patient with regards to her mild short-term memory and word finding difficulties which appear to be stable recommend she continue Cerefolin NAC 1 tablet daily for that as well as increase participation in cognitively challenging activities like solving crossword puzzles, playing bridge and sodoku.  She also has involuntary right leg movements likely from restless leg syndrome which did not appear to have responded to current dose of gabapentin hence I recommend she increase it to 300 mg twice daily.  If this is not effective may consider switching to Requip or Lyrica in the future.  She will return for follow-up in a year or call earlier if necessary. .. Greater than 50% time during this 30 minute  visit was spent on counseling and coordination of care about her memory loss and restless legs. and answering questions Antony Contras, MD  [facility] [address] [place], [place] [address]  Phone [phone] Fax [phone] Note: This document was prepared with digital dictation and possible smart phrase technology. Any transcriptional errors that result from this process are unintentional.

## 2019-09-29 ENCOUNTER — Ambulatory Visit: Payer: Medicare Other

## 2019-09-29 ENCOUNTER — Other Ambulatory Visit: Payer: Self-pay

## 2019-09-29 DIAGNOSIS — I10 Essential (primary) hypertension: Secondary | ICD-10-CM

## 2019-09-29 DIAGNOSIS — K219 Gastro-esophageal reflux disease without esophagitis: Secondary | ICD-10-CM

## 2019-09-29 DIAGNOSIS — F3341 Major depressive disorder, recurrent, in partial remission: Secondary | ICD-10-CM

## 2019-09-29 DIAGNOSIS — E782 Mixed hyperlipidemia: Secondary | ICD-10-CM

## 2019-09-29 NOTE — Chronic Care Management (AMB) (Signed)
Chronic Care Management Pharmacy  Name: Tonya Bartlett  MRN: [id number] DOB: [birth year]  Initial Questions: 1. Have you seen any other providers since your last visit? N/A 2. Any changes in your medicines or health? No   Chief Complaint/ HPI Tonya Bartlett,  74 y.o. , female presents for their Initial CCM visit with the clinical pharmacist via telephone due to COVID-19 Pandemic.  Patient reports she had melanoma and sees dermatologist every 6 months.   She reports losing 40-50 pounds through weight watchers and eating more fruits/ vegetables.   She notes she is not on any medication for psoriatic arthritis except as needed acetaminophen. She reports has tried 2 biologics in the past, and did not tolerate them. She reports she is followed closely with rheumatologist.   PCP : [name], NP  Their chronic conditions include: HTN, HLD, depression, Psoriatic arthritis, insomnia, GERD, constipation, hip pain/ osteoarthritis, migraine, RLS, urethritis, osteopenia  Office Visits: 02/03/2019- [name], NP- Patient presented for office visit for yearly preventative medicine examination. No major interventions. Patient to obtain CBC, CMP, lipid panel, TSH.    Consult Visit: 09/26/2019- Neurology- Antony Contras, MD- patient presented for office visit for memory loss. Recommend to continue Cerefolin NAC 1 tablet once daily. Increase participation in challenging activities. Recommended to increase gabapentin to 338m twice daily for involuntary right leg movements. May consider switch to Requip or Lyrica in the future.   08/01/2019- Hospital admission- MParalee Cancel MD- Patient underwent total hip arthroplasty.   06/11/2019- Urgent Care- YBlanchie Serve MD- Patient presented for right knee pain. Results from x-ray were noncontributory. Patient to follow up with orthopedic. Patient to limit walking while knee is painful. Apply ice for 20 minutes every 2 to 4 hours. Take pain medicine as  needed. Apply brace for support.   06/05/2019- Orthopedic surgery- MParalee Cancel unable to access notes  06/01/2019- Rheumatology- JLeigh Aurora unable to access notes   05/03/2019- Physical medicine and rehabilitation- RSuella Broad unable to access notes   04/26/2019- Psychiatry- NMerian Capron MD- Patient presented for office visit for depression follow up.   04/20/2019- Obstetrics and gynecology- GMarylynn Pearson unable to access notes  01/25/2019- Gastroenterology- SCarolina Cellar MD- Patient presented for office visit for follow up. GERD/ dysphagia controlled on omeprazole 225monce daily. Reasonable to transition to famotidine 2011mnce to twice daily. Take omeprazole 1 day and Pepcit the next for the next 2 weeks. Can transtition to Pepcid everyday and then once to twice daily as needed. Patient follow up in 1 year.   Medications: Outpatient Encounter Medications as of 09/29/2019  Medication Sig  . atorvastatin (LIPITOR) 20 MG tablet Take 1 tablet (20 mg total) by mouth daily at 6 PM.  . betamethasone dipropionate 0.05 % lotion Apply topically daily.  . Calcium-Magnesium-Zinc (CAL-MAG-ZINC PO) Take 1 tablet by mouth every evening.  . Cholecalciferol (VITAMIN D3) 125 MCG (5000 UT) TABS Take 5,000 Units by mouth every evening.  . escitalopram (LEXAPRO) 10 MG tablet Take 1 tablet (10 mg total) by mouth daily.  . famotidine (PEPCID) 20 MG tablet Take one tablet (4m68mnce to twice a day as needed (Patient taking differently: Take 20 mg by mouth daily. )  . FIBER PO Take 1 capsule by mouth in the morning and at bedtime.  . gabapentin (NEURONTIN) 300 MG capsule Take 1 capsule (300 mg total) by mouth 2 (two) times daily.  . HYMarland KitchenROcodone-acetaminophen (NORCO) 7.5-325 MG tablet Take 1-2 tablets by mouth every 4 (four) hours as needed  for moderate pain.  . hydrocortisone cream 1 % Apply 1 application topically daily as needed for itching.  Marland Kitchen L-Methylfolate-B12-B6-B2 (CEREFOLIN PO) Take 1  tablet by mouth daily.  Marland Kitchen losartan (COZAAR) 50 MG tablet TAKE 1 TABLET(50 MG) BY MOUTH DAILY  . sulfamethoxazole-trimethoprim (BACTRIM,SEPTRA) 400-80 MG tablet Take 1 tablet by mouth daily. For urethritis.   . SUMAtriptan (IMITREX) 100 MG tablet Take 1 tablet (100 mg total) by mouth every 2 (two) hours as needed for migraine. May repeat in 2 hours if headache persists or recurs.  . traZODone (DESYREL) 50 MG tablet TAKE 1 TABLET(50 MG) BY MOUTH AT BEDTIME  . ferrous sulfate (FERROUSUL) 325 (65 FE) MG tablet Take 1 tablet (325 mg total) by mouth 3 (three) times daily with meals for 14 days.  . methocarbamol (ROBAXIN) 500 MG tablet Take 1 tablet (500 mg total) by mouth every 6 (six) hours as needed for muscle spasms. (Patient not taking: Reported on 09/26/2019)  . polyethylene glycol (MIRALAX / GLYCOLAX) 17 g packet Take 17 g by mouth 2 (two) times daily. (Patient not taking: Reported on 09/29/2019)  . rivaroxaban (XARELTO) 10 MG TABS tablet Take 1 tablet (10 mg total) by mouth daily. (Patient not taking: Reported on 09/29/2019)   No facility-administered encounter medications on file as of 09/29/2019.     Current Diagnosis/Assessment:  Goals Addressed            This Visit's Progress   . Pharmacy Care Plan       CARE PLAN ENTRY (see longitudinal plan of care for additional care plan information)  Current Barriers:  . Chronic Disease Management support, education, and care coordination needs related to Hypertension, Hyperlipidemia, Depression, and Osteopenia   Hypertension BP Readings from Last 3 Encounters:  09/26/19 131/81  08/02/19 139/63  07/25/19 (!) 165/68   . Pharmacist Clinical Goal(s): o Over the next 180 days, patient will work with PharmD and providers to maintain BP goal <130/80 . Current regimen:  o Losartan 91m, 1 tablet once daily  . Interventions: . Recommend: DASH diet:  following a diet emphasizing fruits and vegetables and low-fat dairy products along with whole  grains, fish, poultry, and nuts. Reducing red meats and sugars.  . Reducing the amount of salt intake to <1502mper day.  . Recommend using a salt substitute to replace your salt if you need flavor.  . Patient self care activities - Over the next 180 days, patient will: o Check BP 1 to 2 times per week, document, and provide at future appointments o Ensure daily salt intake < 2300 mg/day  Hyperlipidemia Lab Results  Component Value Date/Time   LDLCALC 100 (H) 02/03/2019 11:08 AM   LDLDIRECT 187.9 10/08/2009 09:25 AM   . Pharmacist Clinical Goal(s): o Over the next 180 days, patient will work with PharmD and providers to maintain LDL goal < 100 . Current regimen:  o Atorvastatin 2051m1 tablet once daily at 6pm  . Interventions: . How to reduce cholesterol through diet/weight management and physical activity.    . We discussed how a diet high in plant sterols (fruits/vegetables/nuts/whole grains/legumes) may reduce your cholesterol.  Encouraged increasing fiber to a daily intake of 10-25g/day  . Patient self care activities - Over the next 180 days, patient will: o Continue current medications and lifestyle modifications (diet and exercise).   GERD  . Pharmacist Clinical Goal(s) o Over the next 180 days, patient will work with PharmD and providers to decrease acid reflux symptoms.  . Current  regimen:  o Famotidine 18m, 1 tablet once daily . Interventions: o We discussed: non-pharmacological interventions for acid reflux. Take measures to prevent acid reflux, such as avoiding spicy foods, avoiding caffeine, avoid laying down a few hours after eating, and raising the head of the bed. . Patient self care activities - Over the next 180 days, patient will: o Continue current medications as directed by provider.   Depression . Pharmacist Clinical Goal(s) o Over the next 180 days, patient will work with PharmD and providers to improve mood symptoms  . Current regimen:  o Escitalopram  188m 1 tablet once daily  . Patient self care activities o Patient will continue current medications as directed by provider.   Medication management . Pharmacist Clinical Goal(s): o Over the next 180 days, patient will work with PharmD and providers to maintain optimal medication adherence . Current pharmacy: [facility] . Interventions o Comprehensive medication review performed. o Continue current medication management strategy . Patient self care activities - Over the next 180 days, patient will: o Take medications as prescribed o Report any questions or concerns to PharmD and/or provider(s)  Initial goal documentation       SDOH Interventions     Most Recent Value  SDOH Interventions  Financial Strain Interventions Intervention Not Indicated  Transportation Interventions Intervention Not Indicated       Hypertension   Denies dizziness/ lightheadedness   Office blood pressures are  BP Readings from Last 3 Encounters:  09/26/19 131/81  08/02/19 139/63  07/25/19 (!) 165/68   Patient has failed these meds in the past: lisinopril (dry cough)  Patient checks BP at home when feeling symptomatic- wrist monitor  Patient home BP readings are ranging: unable to report numbers  Patient is controlled on:   Losartan 505m1 tablet once daily   We discussed diet and exercise extensively  . DASH diet:  following a diet emphasizing fruits and vegetables and low-fat dairy products along with whole grains, fish, poultry, and nuts. Reducing red meats and sugars.  . Reducing the amount of salt intake to <1500m32mr day.  . Recommend using a salt substitute to replace your salt if you need flavor.   Plan Continue current medications    Hyperlipidemia   LDL goal < 100  Lipid Panel     Component Value Date/Time   CHOL 165 02/03/2019 1108   TRIG 89.0 02/03/2019 1108   HDL 46.40 02/03/2019 1108   LDLCALC 100 (H) 02/03/2019 1108   LDLDIRECT 187.9 10/08/2009 0925      Hepatic Function Latest Ref Rng & Units 02/03/2019 12/29/2017 11/05/2016  Total Protein 6.0 - 8.3 g/dL 6.8 7.0 6.7  Albumin 3.5 - 5.2 g/dL 4.4 4.5 4.0  AST 0 - 37 U/L '16 21 15  ' ALT 0 - 35 U/L '15 21 15  ' Alk Phosphatase 39 - 117 U/L 64 76 60  Total Bilirubin 0.2 - 1.2 mg/dL 0.8 0.6 0.7  Bilirubin, Direct 0.0 - 0.3 mg/dL - 0.1 0.1     The 10-year ASCVD risk score (GofMikey BussingJr., et al., 2013) is: 21.5%   Values used to calculate the score:     Age: 75 y31rs     Sex: Female     Is Non-Hispanic African American: No     Diabetic: No     Tobacco smoker: No     Systolic Blood Pressure: 131 280g     Is BP treated: Yes     HDL Cholesterol: 46.4 mg/dL  Total Cholesterol: 165 mg/dL   Patient has failed these meds in past: none Patient is currently controlled on the following medications:  . Atorvastatin 43m, 1 tablet once daily at 6pm   We discussed:  diet and exercise extensively  . How to reduce cholesterol through diet/weight management and physical activity.    . We discussed how a diet high in plant sterols (fruits/vegetables/nuts/whole grains/legumes) may reduce your cholesterol.  Encouraged increasing fiber to a daily intake of 10-25g/day   Plan Continue current medications  Depression   Patient reports being active and has helped mood   PHQ9 SCORE ONLY 12/28/2017 11/23/2017 12/24/2015  PHQ-9 Total Score 18 2 0   Patient is currently controlled on the following medications:  . Escitalopram 153m 1 tablet once daily   Plan Continue current medications  Psoriatic arthritis    Patient has failed these meds in past: Remicade (high BP), Ot[place] Nailsuicidal thoughts)  Patient is currently controlled on the following medications:  . acetaminophen (dose and frequency unknown)   We discussed:  The maximum daily dose of acetaminophen was discussed with the patient. She was encouraged not to exceed 4,000 mg of acetaminophen during a 24 hour period and was asked to keep in mind  that acetaminophen can also be found in many over-the-counter cold medications as well as narcotics that may be given for pain..   Plan Managed by rheumatology.  Continue current medications   Insomnia    Reports sleeping better with it   Patient has failed these meds in past: none Patient is currently controlled on the following medications:  . Trazodone 5098m1 tablet at bedtime   Plan  Continue current medications   GERD   Denies sx.   Patient is currently controlled on the following medications:   Famotidine 68m46m tablet once daily  We discussed: non-pharmacological interventions for acid reflux. Take measures to prevent acid reflux, such as avoiding spicy foods, avoiding caffeine, avoid laying down a few hours after eating, and raising the head of the bed.  Plan Managed by gastroenterologist. Continue current medications    Consitpation   Reports normal bowel movements.   Patient is currently controlled on the following medications:  . Fiber, 1 capsule in the morning and at bedtime   Plan Continue current medications  Osteoarthritis/ hip pain    Patient reports using about 4 tablets per day for hip pain.    Patient is currently controlled on the following medications:  . Hydrocodone/ acetaminophen 7.5-325mg, 1 to 2 tablet every four hours as needed for moderate pain   We discussed:  Risks versus benefits of CNS depressants in combination with gabapentin   Plan Continue current medications  Migraine    Patient reports she will only take 0.5 tablet for migraine.   Patient is currently controlled on the following medications:  . Sumatriptan 100mg17mtablet every two hours as needed for migraine. May repeat in 2 hours if headache persists or recurs   Plan Continue current medications   RLS  Patient reports twitching in one foot  Patient is currently controlled on the following medications:  . Gabapentin 300mg,74mapsule twice  daily  Plan Continue current medications  Urethritis   Patient has failed these meds in past: none  Patient is currently controlled on the following medications:  . Sulfamethoxazole- trimethoprim 400-80mg, 37mblet once daily   Plan Managed by urologist.  Continue current medications  Osteopenia    Patient reports she does not drink milk  and has about 1 or 2 sources of calcium per week    Last DEXA Scan: 04/01/2018   T-Score right femoral neck: -1.0  T-Score AP spine: -1.9  10-year probability of major osteoporotic fracture: 8.8%  10-year probability of hip fracture: 1.1%   No results found for: VD25OH   Patient is not a candidate for pharmacologic treatment  Patient is currently controlled on the following medications:  . Calcium citrate (52m)- magnesium-zinc- vitamin D (800 IU), 1 tablet every evening . Vitamin D3 5000 units, 1 tablet every evening   We discussed:  Recommend [phone] units of vitamin D daily. Recommend 1200 mg of calcium daily from dietary and supplemental sources. Recommend weight-bearing and muscle strengthening exercises for building and maintaining bone density.  Plan Recommend increasing calcium/ vitamin combination tablet to 1 tablet twice daily and discontinue vitamin D3 supplementation.   Vaccines   Reviewed and discussed patient's vaccination history.    Immunization History  Administered Date(s) Administered  . Fluad Quad(high Dose 65+) 10/31/2018  . Influenza Split 11/17/2011  . Influenza Whole 10/15/2009  . Influenza, High Dose Seasonal PF 01/02/2014, 11/06/2014, 11/15/2015, 11/05/2016, 12/02/2017  . Influenza-Unspecified 10/17/2012  . PFIZER SARS-COV-2 Vaccination 03/25/2019, 04/18/2019  . Pneumococcal Conjugate-13 01/02/2014  . Pneumococcal Polysaccharide-23 10/15/2009  . Td 08/24/2006  . Zoster 02/23/2015    Plan Recommended patient receive shingles vaccine at pharmacy.   Medication Management  Patient organizes  medications: patient reports using pill box (AM/ PM)  Primary pharmacy: [facility]  Adherence: no gaps in refill history (per medication dispense history from 04/02/2019 to 09/29/2019)      Follow up Follow up visit with PharmD in 6 months.   AAnson Crofts PharmD, BCACP Clinical Pharmacist LRancho CalaverasPrimary Care at BFolsom([phone]

## 2019-10-02 NOTE — Patient Instructions (Addendum)
Visit Information  Goals Addressed            This Visit's Progress   . Pharmacy Care Plan       CARE PLAN ENTRY (see longitudinal plan of care for additional care plan information)  Current Barriers:  . Chronic Disease Management support, education, and care coordination needs related to Hypertension, Hyperlipidemia, Depression, and Osteopenia   Hypertension BP Readings from Last 3 Encounters:  09/26/19 131/81  08/02/19 139/63  07/25/19 (!) 165/68   . Pharmacist Clinical Goal(s): o Over the next 180 days, patient will work with PharmD and providers to maintain BP goal <130/80 . Current regimen:  o Losartan 50mg , 1 tablet once daily  . Interventions: . Recommend: DASH diet:  following a diet emphasizing fruits and vegetables and low-fat dairy products along with whole grains, fish, poultry, and nuts. Reducing red meats and sugars.  . Reducing the amount of salt intake to 1500mg /per day.  . Recommend using a salt substitute to replace your salt if you need flavor.  . Patient self care activities - Over the next 180 days, patient will: o Check BP 1 to 2 times per week, document, and provide at future appointments o Ensure daily salt intake < 2300 mg/day  Hyperlipidemia Lab Results  Component Value Date/Time   LDLCALC 100 (H) 02/03/2019 11:08 AM   LDLDIRECT 187.9 10/08/2009 09:25 AM   . Pharmacist Clinical Goal(s): o Over the next 180 days, patient will work with PharmD and providers to maintain LDL goal < 100 . Current regimen:  o Atorvastatin 20mg , 1 tablet once daily at 6pm  . Interventions: . How to reduce cholesterol through diet/weight management and physical activity.    . We discussed how a diet high in plant sterols (fruits/vegetables/nuts/whole grains/legumes) may reduce your cholesterol.  Encouraged increasing fiber to a daily intake of 10-25g/day  . Patient self care activities - Over the next 180 days, patient will: o Continue current medications and  lifestyle modifications (diet and exercise).   GERD  . Pharmacist Clinical Goal(s) o Over the next 180 days, patient will work with PharmD and providers to decrease acid reflux symptoms.  . Current regimen:  o Famotidine 20mg , 1 tablet once daily . Interventions: o We discussed: non-pharmacological interventions for acid reflux. Take measures to prevent acid reflux, such as avoiding spicy foods, avoiding caffeine, avoid laying down a few hours after eating, and raising the head of the bed. . Patient self care activities - Over the next 180 days, patient will: o Continue current medications as directed by provider.   Depression . Pharmacist Clinical Goal(s) o Over the next 180 days, patient will work with PharmD and providers to improve mood symptoms  . Current regimen:  o Escitalopram 10mg , 1 tablet once daily  . Patient self care activities o Patient will continue current medications as directed by provider.   Medication management . Pharmacist Clinical Goal(s): o Over the next 180 days, patient will work with PharmD and providers to maintain optimal medication adherence . Current pharmacy: [facility] . Interventions o Comprehensive medication review performed. o Continue current medication management strategy . Patient self care activities - Over the next 180 days, patient will: o Take medications as prescribed o Report any questions or concerns to PharmD and/or provider(s)  Initial goal documentation        Tonya Bartlett was given information about Chronic Care Management services today including:  1. CCM service includes personalized support from designated clinical staff supervised by her  physician, including individualized plan of care and coordination with other care providers 2. 24/7 contact phone numbers for assistance for urgent and routine care needs. 3. Standard insurance, coinsurance, copays and deductibles apply for chronic care management only during months in which  we provide at least 20 minutes of these services. Most insurances cover these services at 100%, however patients may be responsible for any copay, coinsurance and/or deductible if applicable. This service may help you avoid the need for more expensive face-to-face services. 4. Only one practitioner may furnish and bill the service in a calendar month. 5. The patient may stop CCM services at any time (effective at the end of the month) by phone call to the office staff.  Patient agreed to services and verbal consent obtained.   The patient verbalized understanding of instructions provided today and agreed to receive a mailed copy of patient instruction and/or educational materials. The pharmacy team will reach out to the patient again over the next 90 days.    Anson Crofts, PharmD Clinical Pharmacist [facility] [place] [phone]   Heart-Healthy Eating Plan Heart-healthy meal planning includes:  Eating less unhealthy fats.  Eating more healthy fats.  Making other changes in your diet. Talk with your doctor or a diet specialist (dietitian) to create an eating plan that is right for you. What is my plan? Your doctor may recommend an eating plan that includes:  Total fat: ______% or less of total calories a day.  Saturated fat: ______% or less of total calories a day.  Cholesterol: less than _________mg a day. What are tips for following this plan? Cooking Avoid frying your food. Try to bake, boil, grill, or broil it instead. You can also reduce fat by:  Removing the skin from poultry.  Removing all visible fats from meats.  Steaming vegetables in water or broth. Meal planning   At meals, divide your plate into four equal parts: ? Fill one-half of your plate with vegetables and green salads. ? Fill one-fourth of your plate with whole grains. ? Fill one-fourth of your plate with lean protein foods.  Eat 4-5 servings of vegetables per day. A  serving of vegetables is: ? 1 cup of raw or cooked vegetables. ? 2 cups of raw leafy greens.  Eat 4-5 servings of fruit per day. A serving of fruit is: ? 1 medium whole fruit. ?  cup of dried fruit. ?  cup of fresh, frozen, or canned fruit. ?  cup of 100% fruit juice.  Eat more foods that have soluble fiber. These are apples, broccoli, carrots, beans, peas, and barley. Try to get 20-30 g of fiber per day.  Eat 4-5 servings of nuts, legumes, and seeds per week: ? 1 serving of dried beans or legumes equals  cup after being cooked. ? 1 serving of nuts is  cup. ? 1 serving of seeds equals 1 tablespoon. General information  Eat more home-cooked food. Eat less restaurant, buffet, and fast food.  Limit or avoid alcohol.  Limit foods that are high in starch and sugar.  Avoid fried foods.  Lose weight if you are overweight.  Keep track of how much salt (sodium) you eat. This is important if you have high blood pressure. Ask your doctor to tell you more about this.  Try to add vegetarian meals each week. Fats  Choose healthy fats. These include olive oil and canola oil, flaxseeds, walnuts, almonds, and seeds.  Eat more omega-3 fats. These include salmon,  mackerel, sardines, tuna, flaxseed oil, and ground flaxseeds. Try to eat fish at least 2 times each week.  Check food labels. Avoid foods with trans fats or high amounts of saturated fat.  Limit saturated fats. ? These are often found in animal products, such as meats, butter, and cream. ? These are also found in plant foods, such as palm oil, palm kernel oil, and coconut oil.  Avoid foods with partially hydrogenated oils in them. These have trans fats. Examples are stick margarine, some tub margarines, cookies, crackers, and other baked goods. What foods can I eat? Fruits All fresh, canned (in natural juice), or frozen fruits. Vegetables Fresh or frozen vegetables (raw, steamed, roasted, or grilled). Green  salads. Grains Most grains. Choose whole wheat and whole grains most of the time. Rice and pasta, including brown rice and pastas made with whole wheat. Meats and other proteins Lean, well-trimmed beef, veal, pork, and lamb. Chicken and [place] without skin. All fish and shellfish. Wild duck, rabbit, pheasant, and venison. Egg whites or low-cholesterol egg substitutes. Dried beans, peas, lentils, and tofu. Seeds and most nuts. Dairy Low-fat or nonfat cheeses, including ricotta and mozzarella. Skim or 1% milk that is liquid, powdered, or evaporated. Buttermilk that is made with low-fat milk. Nonfat or low-fat yogurt. Fats and oils Non-hydrogenated (trans-free) margarines. Vegetable oils, including soybean, sesame, sunflower, olive, peanut, safflower, corn, canola, and cottonseed. Salad dressings or mayonnaise made with a vegetable oil. Beverages Mineral water. Coffee and tea. Diet carbonated beverages. Sweets and desserts Sherbet, gelatin, and fruit ice. Small amounts of dark chocolate. Limit all sweets and desserts. Seasonings and condiments All seasonings and condiments. The items listed above may not be a complete list of foods and drinks you can eat. Contact a dietitian for more options. What foods should I avoid? Fruits Canned fruit in heavy syrup. Fruit in cream or butter sauce. Fried fruit. Limit coconut. Vegetables Vegetables cooked in cheese, cream, or butter sauce. Fried vegetables. Grains Breads that are made with saturated or trans fats, oils, or whole milk. Croissants. Sweet rolls. Donuts. High-fat crackers, such as cheese crackers. Meats and other proteins Fatty meats, such as hot dogs, ribs, sausage, bacon, rib-eye roast or steak. High-fat deli meats, such as salami and bologna. Caviar. Domestic duck and goose. Organ meats, such as liver. Dairy Cream, sour cream, cream cheese, and creamed cottage cheese. Whole-milk cheeses. Whole or 2% milk that is liquid, evaporated, or  condensed. Whole buttermilk. Cream sauce or high-fat cheese sauce. Yogurt that is made from whole milk. Fats and oils Meat fat, or shortening. Cocoa butter, hydrogenated oils, palm oil, coconut oil, palm kernel oil. Solid fats and shortenings, including bacon fat, salt pork, lard, and butter. Nondairy cream substitutes. Salad dressings with cheese or sour cream. Beverages Regular sodas and juice drinks with added sugar. Sweets and desserts Frosting. Pudding. Cookies. Cakes. Pies. Milk chocolate or white chocolate. Buttered syrups. Full-fat ice cream or ice cream drinks. The items listed above may not be a complete list of foods and drinks to avoid. Contact a dietitian for more information. Summary  Heart-healthy meal planning includes eating less unhealthy fats, eating more healthy fats, and making other changes in your diet.  Eat a balanced diet. This includes fruits and vegetables, low-fat or nonfat dairy, lean protein, nuts and legumes, whole grains, and heart-healthy oils and fats. This information is not intended to replace advice given to you by your health care provider. Make sure you discuss any questions you have with your  health care provider. Document Revised: 04/08/2017 Document Reviewed: 03/12/2017 Elsevier Patient Education  2020 [employer].

## 2019-10-05 ENCOUNTER — Other Ambulatory Visit (HOSPITAL_COMMUNITY): Payer: Self-pay | Admitting: Psychiatry

## 2019-10-07 ENCOUNTER — Other Ambulatory Visit (HOSPITAL_COMMUNITY): Payer: Self-pay | Admitting: Psychiatry

## 2019-10-30 ENCOUNTER — Telehealth (INDEPENDENT_AMBULATORY_CARE_PROVIDER_SITE_OTHER): Payer: Medicare Other | Admitting: Psychiatry

## 2019-10-30 ENCOUNTER — Encounter (HOSPITAL_COMMUNITY): Payer: Self-pay | Admitting: Psychiatry

## 2019-10-30 DIAGNOSIS — F331 Major depressive disorder, recurrent, moderate: Secondary | ICD-10-CM

## 2019-10-30 NOTE — Progress Notes (Signed)
[facility] Outpatient visit Tele psych  Patient Identification: Tonya Bartlett MRN:  [id number] Date of Evaluation:  10/30/2019 Referral Source: NP Chief Complaint:   Depression follow up  Visit Diagnosis:    ICD-10-CM   1. Major depressive disorder, recurrent episode, moderate (HCC)  F33.1     History of Present Illness: 74 years old currently widowed white female lives by herself has 3 grown kids and 6 grandkids referred initially by family medicine for management of depression   I connected with Stephinie Battisti Bartlett on 10/30/19 at 10:30 AM EDT by telephone and verified that I am speaking with the correct person using two identifiers.   I discussed the limitations of evaluation and management by telemedicine and the availability of in person appointments. The patient expressed understanding and agreed to proceed. 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  Hip replacement surgery.  Medical issues.    Past Psychiatric History: depression  Previous Psychotropic Medications: Yes   Substance Abuse History in the last 12 months:  No.  Consequences of Substance Abuse: NA  Past Medical History:  Past Medical History:  Diagnosis Date  . Allergic rhinitis   . Colitis   . Complication of anesthesia    vomit x1 while on Morphine per pt  . Depression   . Diverticulosis   . Frequency of urination   . GERD (gastroesophageal reflux disease)   . History of colon polyps   . 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 History of hiatal hernia   . History of iron deficiency anemia 1996   iron infusion  . History of migraine headaches   . History of rib fracture   . Hyperlipidemia   . IBS  (irritable bowel syndrome)   . Insomnia   . MCI (mild cognitive impairment)   . Melanoma ([place]) 2019   left leg   . Migraine headache    hx of migraines   . OA (osteoarthritis)    RIGHT SHOULDER  . Pneumonia    remote history  . PONV (postoperative nausea and vomiting)   . 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 Right rotator cuff tear   . Unspecified essential hypertension     Past Surgical History:  Procedure Laterality Date  . BUNIONECTOMY/  HAMMERTOE CORRECTION  RIGHT FOOT  2011  . CATARACT EXTRACTION W/ INTRAOCULAR LENS  IMPLANT, BILATERAL    . COLONOSCOPY    . KNEE ARTHROSCOPY W/ MENISCECTOMY Bilateral X2  LEFT /    X1  RIGHT  . KNEE OPEN LATERAL RELEASE Bilateral   . MOHS SURGERY Left 12/15/2017   Melanoma in situ - left calf - [facility]  . REPLACEMENT TOTAL KNEE Left 2006  . SHOULDER ARTHROSCOPY WITH SUBACROMIAL DECOMPRESSION, ROTATOR CUFF REPAIR AND BICEP TENDON REPAIR Right 05/23/2013   Procedure: RIGHT SHOULDER ARTHROSCOPY EXAM UNDER ANESTHESIA  WITH SUBACROMIAL DECOMPRESSION,DISTAL CLAVICLE RESECTION, SADLABRAL DEBRIDEMENT CHONDROPLASTY, BICEP TENOTOMY ;  Surgeon: Sydnee Cabal, MD;  Location: [facility];  Service: Orthopedics;  Laterality: Right;  . TONSILLECTOMY AND ADENOIDECTOMY  AGE 71  . TOTAL HIP ARTHROPLASTY Left 05/04/2017   Procedure: LEFT TOTAL HIP ARTHROPLASTY ANTERIOR APPROACH;  Surgeon: Paralee Cancel, MD;  Location: [facility];  Service: Orthopedics;  Laterality: Left;  . TOTAL HIP ARTHROPLASTY Right 08/01/2019   Procedure: TOTAL HIP ARTHROPLASTY ANTERIOR APPROACH;  Surgeon: Paralee Cancel, MD;  Location: [facility];  Service: Orthopedics;  Laterality: Right;  51mins  . TOTAL HIP ARTHROPLASTY Right 08/01/2019  . TOTAL KNEE ARTHROPLASTY Bilateral LEFT  1996/   RIGHT 2004  . 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 UPPER GI ENDOSCOPY    . VAGINAL HYSTERECTOMY  1976    Family Psychiatric History: mom possible depression  Family  History:  Family History  Problem Relation Age of Onset  . Heart disease Father 28  . Hypertension Father   . Melanoma Mother   . Cancer Sister        skin CA, 2 sisters  . Esophageal cancer Brother   . Dementia Paternal Grandfather   . Colon cancer Neg Hx   . Colon polyps Neg Hx   . Stomach cancer Neg Hx   . Rectal cancer Neg Hx     Social History:   Social History   Socioeconomic History  . Marital status: Widowed    Spouse name: Not on file  . Number of children: Not on file  . Years of education: Not on file  . Highest education level: Master's degree (e.g., MA, MS, MEng, MEd, MSW, MBA)  Occupational History  . Not on file  Tobacco Use  . Smoking status: Former Smoker    Packs/day: 0.50    Years: 15.00    Pack years: 7.50    Types: Cigarettes    Quit date: 05/15/1985    Years since quitting: 34.4  . Smokeless tobacco: Never Used  Vaping Use  . Vaping Use: Never used  Substance and Sexual Activity  . Alcohol use: No    Comment: RECOVERING ALCOHOLIC--  QUIT [id number]  . Drug use: No  . Sexual activity: Never    Birth control/protection: Post-menopausal  Other Topics Concern  . 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 Difficulty of Paying Living Expenses: Not hard at all  Food Insecurity:   . Worried About Charity fundraiser in the Last Year: Not on file  . Ran Out of Food in the Last Year: Not on file  Transportation Needs: No Transportation Needs  . Lack of Transportation (Medical): No  . Lack of Transportation (Non-Medical): No  Physical Activity:   . Days of Exercise per Week: Not on file  . Minutes of Exercise per Session: Not on file  Stress:   . Feeling of Stress : Not on file  Social Connections:   . Frequency of Communication with Friends and  Family: Not on file  . Frequency of Social Gatherings with Friends and Family: Not on file  . Attends Religious Services: Not on file  . Active Member of Clubs or Organizations: Not on file  . Attends Archivist Meetings: Not on file  . Marital Status: Not on file     Allergies:   Allergies  Allergen Reactions  . [place] Nail [Apremilast] Anaphylaxis    Suicidal ideation  . Penicillins Anaphylaxis    Has patient had a PCN reaction causing immediate rash,  facial/tongue/throat swelling, SOB or lightheadedness with hypotension: Yes Has patient had a PCN reaction causing severe rash involving mucus membranes or skin necrosis: Yes Has patient had a PCN reaction that required hospitalization: No Has patient had a PCN reaction occurring within the last 10 year No If all of the above answers are "NO", then may proceed with Cephalosporin use.   . Erythromycin Other (See Comments)    SEVERE STOMACH CRAMPS  . Morphine And Related Nausea And Vomiting  . Nsaids Other (See Comments)    SEVERE STOMACH CRAMPS, MOUTH SORES **Able to tolerate Tylenol  . Remicade [Infliximab] Other (See Comments)    Shut down immune system-BP high  . Tolmetin     SEVERE STOMACH CRAMPS  . Nickel Rash    Including snaps on hospital gowns     Metabolic Disorder Labs: Lab Results  Component Value Date   HGBA1C 5.3 11/05/2016   No results found for: PROLACTIN Lab Results  Component Value Date   CHOL 165 02/03/2019   TRIG 89.0 02/03/2019   HDL 46.40 02/03/2019   CHOLHDL 4 02/03/2019   VLDL 17.8 02/03/2019   LDLCALC 100 (H) 02/03/2019   [place] 92 12/29/2017     Current Medications: Current Outpatient Medications  Medication Sig Dispense Refill  . atorvastatin (LIPITOR) 20 MG tablet Take 1 tablet (20 mg total) by mouth daily at 6 PM. 90 tablet 3  . betamethasone dipropionate 0.05 % lotion Apply topically daily. 60 mL 2  . Calcium-Magnesium-Zinc (CAL-MAG-ZINC PO) Take 1 tablet by mouth every  evening.    . Cholecalciferol (VITAMIN D3) 125 MCG (5000 UT) TABS Take 5,000 Units by mouth every evening.    . escitalopram (LEXAPRO) 10 MG tablet TAKE 1 TABLET(10 MG) BY MOUTH DAILY 90 tablet 0  . famotidine (PEPCID) 20 MG tablet Take one tablet (20mg ) once to twice a day as needed (Patient taking differently: Take 20 mg by mouth daily. ) 180 tablet 3  . ferrous sulfate (FERROUSUL) 325 (65 FE) MG tablet Take 1 tablet (325 mg total) by mouth 3 (three) times daily with meals for 14 days. 42 tablet 0  . FIBER PO Take 1 capsule by mouth in the morning and at bedtime.    . gabapentin (NEURONTIN) 300 MG capsule Take 1 capsule (300 mg total) by mouth 2 (two) times daily. 90 capsule 11  . HYDROcodone-acetaminophen (NORCO) 7.5-325 MG tablet Take 1-2 tablets by mouth every 4 (four) hours as needed for moderate pain. 60 tablet 0  . hydrocortisone cream 1 % Apply 1 application topically daily as needed for itching.    Marland Kitchen L-Methylfolate-B12-B6-B2 (CEREFOLIN PO) Take 1 tablet by mouth daily.    Marland Kitchen losartan (COZAAR) 50 MG tablet TAKE 1 TABLET(50 MG) BY MOUTH DAILY 90 tablet 3  . methocarbamol (ROBAXIN) 500 MG tablet Take 1 tablet (500 mg total) by mouth every 6 (six) hours as needed for muscle spasms. (Patient not taking: Reported on 09/26/2019) 40 tablet 0  . polyethylene glycol (MIRALAX / GLYCOLAX) 17 g packet Take 17 g by mouth 2 (two) times daily. (Patient not taking: Reported on 09/29/2019) 28 packet 0  . rivaroxaban (XARELTO) 10 MG TABS tablet Take 1 tablet (10 mg total) by mouth daily. (Patient not taking: Reported on 09/29/2019) 14 tablet 0  . sulfamethoxazole-trimethoprim (BACTRIM,SEPTRA) 400-80 MG tablet Take 1 tablet by mouth daily. For urethritis.     . SUMAtriptan (IMITREX) 100 MG tablet Take 1 tablet (100 mg total) by mouth every 2 (two) hours as  needed for migraine. May repeat in 2 hours if headache persists or recurs. 10 tablet 0  . traZODone (DESYREL) 50 MG tablet TAKE 1 TABLET(50 MG) BY MOUTH AT  BEDTIME 90 tablet 1   No current facility-administered medications for this visit.     Psychiatric Specialty Exam: Review of Systems  Cardiovascular: Negative for chest pain.  Psychiatric/Behavioral: Negative for depression and suicidal ideas.    There were no vitals taken for this visit.There is no height or weight on file to calculate BMI.  General Appearance:   Eye Contact:    Speech:  Normal Rate  Volume:  Decreased  Mood:fair  Affect:  Congruent  Thought Process:  Goal Directed  Orientation:  Full (Time, Place, and Person)  Thought Content:  Rumination  Suicidal Thoughts:  No  Homicidal Thoughts:  No  Memory:  Immediate;   Fair Recent;   Fair  Judgement:  Fair  Insight:  Fair  Psychomotor Activity:  Normal  Concentration:  Concentration: Fair and Attention Span: Fair  Recall:  [employer] of Knowledge:Good  Language: Good  Akathisia:  No  Handed:  Right  AIMS (if indicated):    Assets:  Desire for Improvement  ADL's:  Intact  Cognition: Impaired,  Mild  Sleep:  Variable to fair on meds    Treatment Plan Summary: Medication management and Plan as follows  1. 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 The patient was provided an opportunity to ask questions and all were answered. The patient agreed with the plan and demonstrated an understanding of the instructions.   The patient was advised to call back or seek an in-person evaluation if the symptoms worsen or if the condition fails to improve as anticipated. Non face to face time spent: 75min  Fu 5-33m [name], MD 9/13/202110:37 AM

## 2019-11-03 DIAGNOSIS — M25551 Pain in right hip: Secondary | ICD-10-CM | POA: Diagnosis not present

## 2019-11-06 DIAGNOSIS — M25551 Pain in right hip: Secondary | ICD-10-CM | POA: Diagnosis not present

## 2019-11-08 DIAGNOSIS — M25551 Pain in right hip: Secondary | ICD-10-CM | POA: Diagnosis not present

## 2019-11-14 DIAGNOSIS — M25551 Pain in right hip: Secondary | ICD-10-CM | POA: Diagnosis not present

## 2019-11-17 DIAGNOSIS — M25551 Pain in right hip: Secondary | ICD-10-CM | POA: Diagnosis not present

## 2019-12-01 DIAGNOSIS — M25511 Pain in right shoulder: Secondary | ICD-10-CM | POA: Diagnosis not present

## 2019-12-01 DIAGNOSIS — L401 Generalized pustular psoriasis: Secondary | ICD-10-CM | POA: Diagnosis not present

## 2019-12-01 DIAGNOSIS — Z6830 Body mass index (BMI) 30.0-30.9, adult: Secondary | ICD-10-CM | POA: Diagnosis not present

## 2019-12-01 DIAGNOSIS — G5622 Lesion of ulnar nerve, left upper limb: Secondary | ICD-10-CM | POA: Diagnosis not present

## 2019-12-01 DIAGNOSIS — M5030 Other cervical disc degeneration, unspecified cervical region: Secondary | ICD-10-CM | POA: Diagnosis not present

## 2019-12-01 DIAGNOSIS — L4050 Arthropathic psoriasis, unspecified: Secondary | ICD-10-CM | POA: Diagnosis not present

## 2019-12-01 DIAGNOSIS — M5136 Other intervertebral disc degeneration, lumbar region: Secondary | ICD-10-CM | POA: Diagnosis not present

## 2019-12-01 DIAGNOSIS — M150 Primary generalized (osteo)arthritis: Secondary | ICD-10-CM | POA: Diagnosis not present

## 2019-12-01 DIAGNOSIS — E669 Obesity, unspecified: Secondary | ICD-10-CM | POA: Diagnosis not present

## 2019-12-13 ENCOUNTER — Other Ambulatory Visit: Payer: Self-pay | Admitting: Adult Health

## 2019-12-13 DIAGNOSIS — Z760 Encounter for issue of repeat prescription: Secondary | ICD-10-CM

## 2020-01-03 DIAGNOSIS — R300 Dysuria: Secondary | ICD-10-CM | POA: Diagnosis not present

## 2020-01-03 DIAGNOSIS — R102 Pelvic and perineal pain: Secondary | ICD-10-CM | POA: Diagnosis not present

## 2020-01-03 DIAGNOSIS — N3020 Other chronic cystitis without hematuria: Secondary | ICD-10-CM | POA: Diagnosis not present

## 2020-01-30 ENCOUNTER — Telehealth: Payer: Self-pay | Admitting: Adult Health

## 2020-01-30 DIAGNOSIS — E782 Mixed hyperlipidemia: Secondary | ICD-10-CM

## 2020-01-30 DIAGNOSIS — I10 Essential (primary) hypertension: Secondary | ICD-10-CM

## 2020-01-30 MED ORDER — ATORVASTATIN CALCIUM 20 MG PO TABS
20.0000 mg | ORAL_TABLET | Freq: Every day | ORAL | 3 refills | Status: DC
Start: 2020-01-30 — End: 2020-08-06

## 2020-01-30 MED ORDER — LOSARTAN POTASSIUM 50 MG PO TABS: ORAL_TABLET | ORAL | 3 refills | Status: DC

## 2020-01-30 NOTE — Telephone Encounter (Signed)
Pt need a refill on the following medication --pt have her physical in January  -atorvastatin (LIPITOR) 20 MG tablet losartan (COZAAR) 50 MG tablet [id number] - [place], [place] - [address] AT [address]  SLHTD:[phone] Fax:  [phone]

## 2020-01-30 NOTE — Telephone Encounter (Signed)
Rx's sent in. °

## 2020-02-09 ENCOUNTER — Other Ambulatory Visit: Payer: Self-pay | Admitting: Adult Health

## 2020-02-09 DIAGNOSIS — G4700 Insomnia, unspecified: Secondary | ICD-10-CM

## 2020-02-19 ENCOUNTER — Other Ambulatory Visit: Payer: Self-pay | Admitting: *Deleted

## 2020-02-19 MED ORDER — FAMOTIDINE 20 MG PO TABS: 20.0000 mg | ORAL_TABLET | Freq: Two times a day (BID) | ORAL | 0 refills | Status: DC

## 2020-02-27 DIAGNOSIS — Z1152 Encounter for screening for COVID-19: Secondary | ICD-10-CM | POA: Diagnosis not present

## 2020-02-28 ENCOUNTER — Encounter: Payer: Self-pay | Admitting: Adult Health

## 2020-02-28 ENCOUNTER — Telehealth (INDEPENDENT_AMBULATORY_CARE_PROVIDER_SITE_OTHER): Payer: Medicare Other | Admitting: Adult Health

## 2020-02-28 ENCOUNTER — Other Ambulatory Visit: Payer: Self-pay

## 2020-02-28 VITALS — Temp 97.7°F | Wt 188.0 lb

## 2020-02-28 DIAGNOSIS — B349 Viral infection, unspecified: Secondary | ICD-10-CM

## 2020-02-28 DIAGNOSIS — R059 Cough, unspecified: Secondary | ICD-10-CM | POA: Diagnosis not present

## 2020-02-28 MED ORDER — ALBUTEROL SULFATE HFA 108 (90 BASE) MCG/ACT IN AERS: 2.0000 | INHALATION_SPRAY | Freq: Four times a day (QID) | RESPIRATORY_TRACT | 2 refills | Status: DC | PRN

## 2020-02-28 MED ORDER — PREDNISONE 10 MG PO TABS: ORAL_TABLET | ORAL | 0 refills | Status: DC

## 2020-02-28 MED ORDER — BENZONATATE 200 MG PO CAPS: 200.0000 mg | ORAL_CAPSULE | Freq: Two times a day (BID) | ORAL | 1 refills | Status: DC | PRN

## 2020-02-28 NOTE — Progress Notes (Signed)
Virtual Visit via Telephone Note  I connected with Tonya Bartlett on 02/28/20 at 10:30 AM EST by telephone and verified that I am speaking with the correct person using two identifiers.   I discussed the limitations, risks, security and privacy concerns of performing an evaluation and management service by telephone and the availability of in person appointments. I also discussed with the patient that there may be a patient responsible charge related to this service. The patient expressed understanding and agreed to proceed.  Location patient: home Location provider: work or home office Participants present for the call: patient, provider Patient did not have a visit in the prior 7 days to address this/these issue(s).   History of Present Illness: 75 year old female who  has a past medical history of Allergic rhinitis, Colitis, Complication of anesthesia, Depression, Diverticulosis, Frequency of urination, GERD (gastroesophageal reflux disease), History of colon polyps, History of DVT of lower extremity, History of hiatal hernia, History of iron deficiency anemia (1996), History of migraine headaches, History of rib fracture, Hyperlipidemia, IBS (irritable bowel syndrome), Insomnia, MCI (mild cognitive impairment), Melanoma ([place]) (2019), Migraine headache, OA (osteoarthritis), Pneumonia, PONV (postoperative nausea and vomiting), Recovering alcoholic ([place]), Right rotator cuff tear, and Unspecified essential hypertension.  She is being evaluated today for an acute issue.  Symptoms have been present for roughly a week.  Symptoms include semiproductive cough, sore throat, shortness of breath that is mild in nature, nasal congestion, with mild wheezing.  Her symptoms started with a runny nose which has resolved.  She denies nausea, vomiting, diarrhea, fever, or chills  She did rapid COVID test 3 days ago and then earlier today, both were negative.  She had a PCR test done yesterday but has not  received results back yet.     Observations/Objective: Patient sounds cheerful and well on the phone. I do not appreciate any SOB. Speech and thought processing are grossly intact. Patient reported vitals:  Assessment and Plan: 1. Viral illness -Not rule out COVID until PCR test comes back.  She will update me when she gets her test results.  Due to shortness of breath and wheezing we will send in albuterol inhaler as well as a course of prednisone. - benzonatate (TESSALON) 200 MG capsule; Take 1 capsule (200 mg total) by mouth 2 (two) times daily as needed for cough.  Dispense: 20 capsule; Refill: 1 - albuterol (VENTOLIN HFA) 108 (90 Base) MCG/ACT inhaler; Inhale 2 puffs into the lungs every 6 (six) hours as needed for wheezing or shortness of breath.  Dispense: 8 g; Refill: 2 - predniSONE (DELTASONE) 10 MG tablet; 40 mg x 3 days, 20 mg x 3 days, 10 mg x 3 days  Dispense: 21 tablet; Refill: 0  2. Cough  - benzonatate (TESSALON) 200 MG capsule; Take 1 capsule (200 mg total) by mouth 2 (two) times daily as needed for cough.  Dispense: 20 capsule; Refill: 1 - albuterol (VENTOLIN HFA) 108 (90 Base) MCG/ACT inhaler; Inhale 2 puffs into the lungs every 6 (six) hours as needed for wheezing or shortness of breath.  Dispense: 8 g; Refill: 2 - predniSONE (DELTASONE) 10 MG tablet; 40 mg x 3 days, 20 mg x 3 days, 10 mg x 3 days  Dispense: 21 tablet; Refill: 0   Follow Up Instructions:  @   [id number] 5-10 [id number] 11-20 9443 21-30 I did not refer this patient for an OV in the next 24 hours for this/these issue(s).  I discussed the assessment and treatment  plan with the patient. The patient was provided an opportunity to ask questions and all were answered. The patient agreed with the plan and demonstrated an understanding of the instructions.   The patient was advised to call back or seek an in-person evaluation if the symptoms worsen or if the condition fails to improve as anticipated.  I  provided 21 minutes of non-face-to-face time during this encounter.   [name], NP

## 2020-02-29 ENCOUNTER — Encounter: Payer: Medicare Other | Admitting: Adult Health

## 2020-03-01 ENCOUNTER — Ambulatory Visit (INDEPENDENT_AMBULATORY_CARE_PROVIDER_SITE_OTHER): Payer: Medicare Other

## 2020-03-01 ENCOUNTER — Other Ambulatory Visit: Payer: Self-pay

## 2020-03-01 ENCOUNTER — Encounter (HOSPITAL_COMMUNITY): Payer: Self-pay

## 2020-03-01 ENCOUNTER — Other Ambulatory Visit: Payer: Self-pay | Admitting: Adult Health

## 2020-03-01 ENCOUNTER — Ambulatory Visit (HOSPITAL_COMMUNITY)
Admission: EM | Admit: 2020-03-01 | Discharge: 2020-03-01 | Disposition: A | Discharge status: Home / Self Care | Payer: Medicare Other | Attending: Emergency Medicine | Admitting: Emergency Medicine

## 2020-03-01 DIAGNOSIS — Z20822 Contact with and (suspected) exposure to covid-19: Secondary | ICD-10-CM | POA: Diagnosis not present

## 2020-03-01 DIAGNOSIS — J069 Acute upper respiratory infection, unspecified: Secondary | ICD-10-CM | POA: Insufficient documentation

## 2020-03-01 DIAGNOSIS — R0602 Shortness of breath: Secondary | ICD-10-CM | POA: Diagnosis not present

## 2020-03-01 DIAGNOSIS — R030 Elevated blood-pressure reading, without diagnosis of hypertension: Secondary | ICD-10-CM | POA: Diagnosis not present

## 2020-03-01 DIAGNOSIS — J029 Acute pharyngitis, unspecified: Secondary | ICD-10-CM | POA: Insufficient documentation

## 2020-03-01 DIAGNOSIS — R062 Wheezing: Secondary | ICD-10-CM | POA: Diagnosis not present

## 2020-03-01 DIAGNOSIS — Z87891 Personal history of nicotine dependence: Secondary | ICD-10-CM | POA: Diagnosis not present

## 2020-03-01 DIAGNOSIS — R059 Cough, unspecified: Secondary | ICD-10-CM | POA: Diagnosis not present

## 2020-03-01 LAB — SARS CORONAVIRUS 2 (TAT 6-24 HRS): SARS Coronavirus 2: NEGATIVE

## 2020-03-01 NOTE — Telephone Encounter (Signed)
Sent to the pharmacy by e-scribe. 

## 2020-03-01 NOTE — ED Triage Notes (Signed)
Pt presents with non productive cough, congestion, shortness of breath, and sore throat since Sunday; pt states she has had a positive covid exposure.

## 2020-03-01 NOTE — ED Provider Notes (Signed)
[facility]    CSN: [id number] Arrival date & time: 03/01/20  1140      History   Chief Complaint Chief Complaint  Patient presents with  . Cough  . Shortness of Breath    HPI Tonya Bartlett is a 75 y.o. female.   HPI Tonya Bartlett is a 75 y.o. female presenting to UC with c/o nonproductive cough, congestion, mild intermittent SOB and sore throat for 6 days. Triage note states pt was exposed to [place] but pt denies COVID exposure. She spoke with her PCP earlier in the week, was prescribed tessalon perls, prednisone and albuterol inhaler. States cough is mainly dry. Denies fever, chills, n/v/d. She reports receiving 3 doses of COVID vaccine.    Past Medical History:  Diagnosis Date  . Allergic rhinitis   . Colitis   . Complication of anesthesia    vomit x1 while on Morphine per pt  . Depression   . Diverticulosis   . Frequency of urination   . GERD (gastroesophageal reflux disease)   . History of colon polyps   . 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 History of hiatal hernia   . History of iron deficiency anemia 1996   iron infusion  . History of migraine headaches   . History of rib fracture   . Hyperlipidemia   . IBS (irritable bowel syndrome)   . Insomnia   . MCI (mild cognitive impairment)   . Melanoma ([place]) 2019   left leg   . Migraine headache    hx of migraines   . OA (osteoarthritis)    RIGHT SHOULDER  . Pneumonia    remote history  . PONV (postoperative nausea and vomiting)   . Recovering alcoholic ([place])    SINCE [id number]  . Right rotator cuff tear   . Unspecified essential hypertension     Patient Active Problem List   Diagnosis Date Noted  . Right hip OA 08/01/2019  . 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 Insomnia 02/03/2019  . Onychomycosis 11/28/2018  . Obese 05/05/2017  . 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 MCI (mild cognitive impairment) 09/19/2015  . Memory deficit 09/19/2015  . SIRS  (systemic inflammatory response syndrome) ([place]) 10/29/2014  . 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 Depression 11/16/2013  . ADD (attention deficit disorder) 11/17/2011  . Essential hypertension, benign 10/15/2009  . SEBORRHEIC KERATOSIS, INFLAMED 10/17/2008  . CARPAL TUNNEL SYNDROME, LEFT 06/22/2008  . Allergic rhinitis 08/30/2007  . Hyperlipidemia 11/11/2006  . Migraine 11/11/2006  . Osteoarthritis 11/11/2006    Past Surgical History:  Procedure Laterality Date  . BUNIONECTOMY/  HAMMERTOE CORRECTION  RIGHT FOOT  2011  . CATARACT EXTRACTION W/ INTRAOCULAR LENS  IMPLANT, BILATERAL    . COLONOSCOPY    . KNEE ARTHROSCOPY W/ MENISCECTOMY Bilateral X2  LEFT /    X1  RIGHT  . KNEE OPEN LATERAL RELEASE Bilateral   . MOHS SURGERY Left 12/15/2017   Melanoma in situ - left calf - [facility]  . REPLACEMENT TOTAL KNEE Left 2006  . SHOULDER ARTHROSCOPY WITH SUBACROMIAL DECOMPRESSION, ROTATOR CUFF REPAIR AND BICEP TENDON REPAIR Right 05/23/2013   Procedure: RIGHT SHOULDER ARTHROSCOPY EXAM UNDER ANESTHESIA  WITH SUBACROMIAL DECOMPRESSION,DISTAL CLAVICLE RESECTION, SADLABRAL DEBRIDEMENT CHONDROPLASTY, BICEP TENOTOMY ;  Surgeon: Sydnee Cabal, MD;  Location: [facility];  Service: Orthopedics;  Laterality: Right;  . TONSILLECTOMY AND ADENOIDECTOMY  AGE  17  . TOTAL HIP ARTHROPLASTY Left 05/04/2017   Procedure: LEFT TOTAL HIP ARTHROPLASTY ANTERIOR APPROACH;  Surgeon: Paralee Cancel, MD;  Location: [facility];  Service: Orthopedics;  Laterality: Left;  . TOTAL HIP ARTHROPLASTY Right 08/01/2019   Procedure: TOTAL HIP ARTHROPLASTY ANTERIOR APPROACH;  Surgeon: Paralee Cancel, MD;  Location: [facility];  Service: Orthopedics;  Laterality: Right;  76mins  . TOTAL HIP ARTHROPLASTY Right 08/01/2019  . TOTAL KNEE ARTHROPLASTY Bilateral LEFT  1996/   RIGHT 2004  . 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 UPPER GI ENDOSCOPY    . VAGINAL HYSTERECTOMY  1976    OB History   No obstetric  history on file.      Home Medications    Prior to Admission medications   Medication Sig Start Date End Date Taking? Authorizing Provider  albuterol (VENTOLIN HFA) 108 (90 Base) MCG/ACT inhaler Inhale 2 puffs into the lungs every 6 (six) hours as needed for wheezing or shortness of breath. 02/28/20   Nafziger, Tommi Rumps, NP  atorvastatin (LIPITOR) 20 MG tablet Take 1 tablet (20 mg total) by mouth daily at 6 PM. 01/30/20   Nafziger, Tommi Rumps, NP  benzonatate (TESSALON) 200 MG capsule Take 1 capsule (200 mg total) by mouth 2 (two) times daily as needed for cough. 02/28/20   Nafziger, Tommi Rumps, NP  betamethasone dipropionate 0.05 % lotion APPLY EXTERNALLY TO THE AFFECTED AREA DAILY 12/13/19   Nafziger, Tommi Rumps, NP  Calcium-Magnesium-Zinc (CAL-MAG-ZINC PO) Take 1 tablet by mouth every evening.    [provider]  Cholecalciferol (VITAMIN D3) 125 MCG (5000 UT) TABS Take 5,000 Units by mouth every evening.    [provider]  escitalopram (LEXAPRO) 10 MG tablet TAKE 1 TABLET(10 MG) BY MOUTH DAILY 10/09/19   [name], MD  famotidine (PEPCID) 20 MG tablet Take 1 tablet (20 mg total) by mouth 2 (two) times daily. MUST HAVE OFFICE VISIT FOR FURTHER REFILLS 02/19/20   Armbruster, [name], MD  ferrous sulfate (FERROUSUL) 325 (65 FE) MG tablet Take 1 tablet (325 mg total) by mouth 3 (three) times daily with meals for 14 days. 08/02/19 08/16/19  Danae Orleans, PA-C  FIBER PO Take 1 capsule by mouth in the morning and at bedtime.    [provider]  gabapentin (NEURONTIN) 300 MG capsule Take 1 capsule (300 mg total) by mouth 2 (two) times daily. 09/26/19   Garvin Fila, MD  HYDROcodone-acetaminophen (NORCO) 7.5-325 MG tablet Take 1-2 tablets by mouth every 4 (four) hours as needed for moderate pain. 08/02/19   Danae Orleans, PA-C  hydrocortisone cream 1 % Apply 1 application topically daily as needed for itching.    [provider]  L-Methylfolate-B12-B6-B2 (CEREFOLIN PO) Take 1  tablet by mouth daily.    [provider]  losartan (COZAAR) 50 MG tablet TAKE 1 TABLET(50 MG) BY MOUTH DAILY 01/30/20   Nafziger, Tommi Rumps, NP  methocarbamol (ROBAXIN) 500 MG tablet Take 1 tablet (500 mg total) by mouth every 6 (six) hours as needed for muscle spasms. 08/02/19   Danae Orleans, PA-C  polyethylene glycol (MIRALAX / GLYCOLAX) 17 g packet Take 17 g by mouth 2 (two) times daily. 08/02/19   Danae Orleans, PA-C  predniSONE (DELTASONE) 10 MG tablet 40 mg x 3 days, 20 mg x 3 days, 10 mg x 3 days 02/28/20   [name], NP  rivaroxaban (XARELTO) 10 MG TABS tablet Take 1 tablet (10 mg total) by mouth daily. 08/02/19   Danae Orleans, PA-C  sulfamethoxazole-trimethoprim (BACTRIM,SEPTRA) 400-80 MG tablet Take 1 tablet by mouth daily. For urethritis.    [name], MD  SUMAtriptan (IMITREX) 100 MG tablet Take 1 tablet (100 mg total) by mouth every 2 (two) hours as needed for migraine. May repeat in 2 hours if headache persists or recurs. 11/16/18   Nafziger, Tommi Rumps, NP  traZODone (DESYREL) 50 MG tablet TAKE 1 TABLET(50 MG) BY MOUTH AT BEDTIME 02/12/20   [name], NP    Family History Family History  Problem Relation Age of Onset  . Heart disease Father 76  . Hypertension Father   . Melanoma Mother   . Cancer Sister        skin CA, 2 sisters  . Esophageal cancer Brother   . Dementia Paternal Grandfather   . Colon cancer Neg Hx   . Colon polyps Neg Hx   . Stomach cancer Neg Hx   . Rectal cancer Neg Hx     Social History Social History   Tobacco Use  . Smoking status: Former Smoker    Packs/day: 0.50    Years: 15.00    Pack years: 7.50    Types: Cigarettes    Quit date: 05/15/1985    Years since quitting: 34.8  . Smokeless tobacco: Never Used  Vaping Use  . Vaping Use: Never used  Substance Use Topics  . Alcohol use: No    Comment: RECOVERING ALCOHOLIC--  QUIT [id number]  . Drug use: No     Allergies   Otezla [apremilast], Penicillins, Erythromycin,  Morphine and related, Nsaids, Remicade [infliximab], Tolmetin, and Nickel   Review of Systems Review of Systems  Constitutional: Negative for chills and fever.  HENT: Positive for congestion. Negative for ear pain, sore throat, trouble swallowing and voice change.   Respiratory: Positive for cough and chest tightness. Negative for shortness of breath.   Cardiovascular: Negative for chest pain and palpitations.  Gastrointestinal: Negative for abdominal pain, diarrhea, nausea and vomiting.  Musculoskeletal: Negative for arthralgias, back pain and myalgias.  Skin: Negative for rash.  All other systems reviewed and are negative.    Physical Exam Triage Vital Signs ED Triage Vitals  Enc Vitals Group     BP 03/01/20 1256 (!) 169/83     Pulse Rate 03/01/20 1256 72     Resp 03/01/20 1256 (!) 22     Temp 03/01/20 1256 97.9 F (36.6 C)     Temp Source 03/01/20 1256 Oral     SpO2 03/01/20 1256 96 %     Weight --      Height --      Head Circumference --      Peak Flow --      Pain Score 03/01/20 1258 6     Pain Loc --      Pain Edu? --      Excl. in [place]? --    No data found.  Updated Vital Signs BP (!) 176/100 (BP Location: Left Arm)   Pulse 60   Temp 97.9 F (36.6 C) (Oral)   Resp (!) 22   SpO2 96%   Visual Acuity Right Eye Distance:   Left Eye Distance:   Bilateral Distance:    Right Eye Near:   Left Eye Near:    Bilateral Near:     Physical Exam Vitals and nursing note reviewed.  Constitutional:      General: She is not in acute distress.    Appearance: She is well-developed and well-nourished. She is not ill-appearing, toxic-appearing  or diaphoretic.  HENT:     Head: Normocephalic and atraumatic.     Right Ear: Tympanic membrane and ear canal normal.     Left Ear: Tympanic membrane and ear canal normal.     Nose: Nose normal.     Right Sinus: No maxillary sinus tenderness or frontal sinus tenderness.     Left Sinus: No maxillary sinus tenderness or frontal  sinus tenderness.     Mouth/Throat:     Lips: Pink.     Mouth: Mucous membranes are moist.     Pharynx: Oropharynx is clear. Uvula midline. No pharyngeal swelling, oropharyngeal exudate, posterior oropharyngeal erythema or uvula swelling.  Eyes:     Extraocular Movements: EOM normal.  Cardiovascular:     Rate and Rhythm: Normal rate and regular rhythm.  Pulmonary:     Effort: Pulmonary effort is normal.     Breath sounds: Normal breath sounds. No decreased breath sounds, wheezing, rhonchi or rales.  Musculoskeletal:        General: Normal range of motion.     Cervical back: Normal range of motion.  Skin:    General: Skin is warm and dry.  Neurological:     Mental Status: She is alert and oriented to person, place, and time.  Psychiatric:        Mood and Affect: Mood and affect normal.        Behavior: Behavior normal.      UC Treatments / Results  Labs (all labs ordered are listed, but only abnormal results are displayed) Labs Reviewed  SARS CORONAVIRUS 2 (TAT 6-24 HRS)    EKG   Radiology DG Chest 2 View  Result Date: 03/01/2020 CLINICAL DATA:  Productive cough, shortness of breath, and wheezing for 5 days. EXAM: CHEST - 2 VIEW COMPARISON:  04/07/2017 FINDINGS: The heart size and mediastinal contours are within normal limits. Both lungs are clear. The visualized skeletal structures are unremarkable. IMPRESSION: No active cardiopulmonary disease. Electronically Signed   By: Marlaine Hind M.D.   On: 03/01/2020 14:10    Procedures Procedures (including critical care time)  Medications Ordered in UC Medications - No data to display  Initial Impression / Assessment and Plan / UC Course  I have reviewed the triage vital signs and the nursing notes.  Pertinent labs & imaging results that were available during my care of the patient were reviewed by me and considered in my medical decision making (see chart for details).    Reviewed CXR with pt No evidence of bacterial  infection at this time.  COVID PCR pending Encouraged to monitor BP F/u with PCP next week if needed Discussed symptoms that warrant emergent care in the ED. AVS given  Final Clinical Impressions(s) / UC Diagnoses   Final diagnoses:  Viral URI with cough  Elevated blood pressure reading     Discharge Instructions      Be sure to monitor your blood pressure at home and consider stopping the prednisone, which is likely contributing to your elevated blood pressure. Be sure to stay well hydrated. Follow up with primary care next week if not improving.  Call 911 or have someone drive you to the hospital if symptoms significantly worsening- severe chest pain, trouble breathing, severe headache, dizziness/passing out or other new concerning symptoms develop.      ED Prescriptions    None     PDMP not reviewed this encounter.   Noe Gens, [place] 03/01/20 1503

## 2020-03-01 NOTE — Discharge Instructions (Signed)
  Be sure to monitor your blood pressure at home and consider stopping the prednisone, which is likely contributing to your elevated blood pressure. Be sure to stay well hydrated. Follow up with primary care next week if not improving.  Call 911 or have someone drive you to the hospital if symptoms significantly worsening- severe chest pain, trouble breathing, severe headache, dizziness/passing out or other new concerning symptoms develop.

## 2020-03-07 DIAGNOSIS — M5136 Other intervertebral disc degeneration, lumbar region: Secondary | ICD-10-CM | POA: Diagnosis not present

## 2020-03-07 DIAGNOSIS — G5622 Lesion of ulnar nerve, left upper limb: Secondary | ICD-10-CM | POA: Diagnosis not present

## 2020-03-07 DIAGNOSIS — M150 Primary generalized (osteo)arthritis: Secondary | ICD-10-CM | POA: Diagnosis not present

## 2020-03-07 DIAGNOSIS — Z6832 Body mass index (BMI) 32.0-32.9, adult: Secondary | ICD-10-CM | POA: Diagnosis not present

## 2020-03-07 DIAGNOSIS — M5030 Other cervical disc degeneration, unspecified cervical region: Secondary | ICD-10-CM | POA: Diagnosis not present

## 2020-03-07 DIAGNOSIS — M25511 Pain in right shoulder: Secondary | ICD-10-CM | POA: Diagnosis not present

## 2020-03-07 DIAGNOSIS — L4050 Arthropathic psoriasis, unspecified: Secondary | ICD-10-CM | POA: Diagnosis not present

## 2020-03-07 DIAGNOSIS — E669 Obesity, unspecified: Secondary | ICD-10-CM | POA: Diagnosis not present

## 2020-03-07 DIAGNOSIS — L401 Generalized pustular psoriasis: Secondary | ICD-10-CM | POA: Diagnosis not present

## 2020-03-26 DIAGNOSIS — L218 Other seborrheic dermatitis: Secondary | ICD-10-CM | POA: Diagnosis not present

## 2020-03-26 DIAGNOSIS — L905 Scar conditions and fibrosis of skin: Secondary | ICD-10-CM | POA: Diagnosis not present

## 2020-03-26 DIAGNOSIS — D225 Melanocytic nevi of trunk: Secondary | ICD-10-CM | POA: Diagnosis not present

## 2020-03-26 DIAGNOSIS — D485 Neoplasm of uncertain behavior of skin: Secondary | ICD-10-CM | POA: Diagnosis not present

## 2020-03-26 DIAGNOSIS — L821 Other seborrheic keratosis: Secondary | ICD-10-CM | POA: Diagnosis not present

## 2020-03-26 DIAGNOSIS — Z85820 Personal history of malignant melanoma of skin: Secondary | ICD-10-CM | POA: Diagnosis not present

## 2020-03-26 DIAGNOSIS — L814 Other melanin hyperpigmentation: Secondary | ICD-10-CM | POA: Diagnosis not present

## 2020-03-26 DIAGNOSIS — L298 Other pruritus: Secondary | ICD-10-CM | POA: Diagnosis not present

## 2020-03-26 DIAGNOSIS — B079 Viral wart, unspecified: Secondary | ICD-10-CM | POA: Diagnosis not present

## 2020-04-10 ENCOUNTER — Other Ambulatory Visit: Payer: Self-pay

## 2020-04-10 ENCOUNTER — Ambulatory Visit (INDEPENDENT_AMBULATORY_CARE_PROVIDER_SITE_OTHER): Payer: Medicare Other

## 2020-04-10 DIAGNOSIS — Z0000 Encounter for general adult medical examination without abnormal findings: Secondary | ICD-10-CM

## 2020-04-10 NOTE — Progress Notes (Signed)
Virtual Visit via Telephone Note  I connected with  Tonya Bartlett on 04/10/20 at 10:15 AM EST by telephone and verified that I am speaking with the correct person using two identifiers.  Medicare Annual Wellness visit completed telephonically due to Covid-19 pandemic.   Persons participating in this call: This Health Coach and this patient.   Location: Patient: Home Provider: [facility]   I discussed the limitations, risks, security and privacy concerns of performing an evaluation and management service by telephone and the availability of in person appointments. The patient expressed understanding and agreed to proceed.  Unable to perform video visit due to video visit attempted and failed and/or patient does not have video capability.   Some vital signs may be absent or patient reported.   Willette Brace, LPN     Subjective:   Tonya Bartlett is a 75 y.o. female who presents for Medicare Annual (Subsequent) preventive examination.  Review of Systems     Cardiac Risk Factors include: advanced age (>77men, >61 women);hypertension;dyslipidemia;obesity (BMI >30kg/m2)     Objective:    There were no vitals filed for this visit. There is no height or weight on file to calculate BMI.  Advanced Directives 04/10/2020 08/01/2019 07/25/2019 07/19/2017 05/04/2017 04/23/2017 04/14/2017  Does Patient Have a Medical Advance Directive? No No No No No No No  Type of Advance Directive - - - - - - -  Does patient want to make changes to medical advance directive? - - - - - - -  Copy of [place] in Chart? - - - - - - -  Would patient like information on creating a medical advance directive? Yes (MAU/Ambulatory/Procedural Areas - Information given) No - Patient declined - No - Patient declined No - Patient declined - -    Current Medications (verified) Outpatient Encounter Medications as of 04/10/2020  Medication Sig  . atorvastatin (LIPITOR) 20 MG tablet Take 1 tablet (20 mg  total) by mouth daily at 6 PM.  . betamethasone dipropionate 0.05 % lotion APPLY EXTERNALLY TO THE AFFECTED AREA DAILY  . Calcium-Magnesium-Zinc (CAL-MAG-ZINC PO) Take 1 tablet by mouth every evening.  . Cholecalciferol (VITAMIN D3) 125 MCG (5000 UT) TABS Take 5,000 Units by mouth every evening.  . escitalopram (LEXAPRO) 10 MG tablet TAKE 1 TABLET(10 MG) BY MOUTH DAILY  . FIBER PO Take 1 capsule by mouth in the morning and at bedtime.  . gabapentin (NEURONTIN) 300 MG capsule Take 1 capsule (300 mg total) by mouth 2 (two) times daily.  Marland Kitchen HYDROcodone-acetaminophen (NORCO/VICODIN) 5-325 MG tablet Take 1 tablet by mouth every 6 (six) hours as needed.  . hydrocortisone cream 1 % Apply 1 application topically daily as needed for itching.  Marland Kitchen L-Methylfolate-B12-B6-B2 (CEREFOLIN PO) Take 1 tablet by mouth daily.  Marland Kitchen losartan (COZAAR) 50 MG tablet TAKE 1 TABLET(50 MG) BY MOUTH DAILY  . polyethylene glycol (MIRALAX / GLYCOLAX) 17 g packet Take 17 g by mouth 2 (two) times daily.  . rivaroxaban (XARELTO) 10 MG TABS tablet Take 1 tablet (10 mg total) by mouth daily.  Marland Kitchen sulfamethoxazole-trimethoprim (BACTRIM,SEPTRA) 400-80 MG tablet Take 1 tablet by mouth daily. For urethritis.  . SUMAtriptan (IMITREX) 100 MG tablet TAKE 1 TABLET BY MOUTH AS NEEDED FOR MIGRAINE. MAY REPEAT DOSE IN 2 HOURS IF HEADACHE PERSISTS OR RECURS  . traZODone (DESYREL) 50 MG tablet TAKE 1 TABLET(50 MG) BY MOUTH AT BEDTIME  . clindamycin (CLEOCIN) 300 MG capsule clindamycin HCl 300 mg capsule  TAKE  3 CAPSULES BY MOUTH 1 HOUR BEFORE DENTAL APPOINTMENT  . famotidine (PEPCID) 20 MG tablet Take 1 tablet (20 mg total) by mouth 2 (two) times daily. MUST HAVE OFFICE VISIT FOR FURTHER REFILLS (Patient not taking: Reported on 04/10/2020)  . ferrous sulfate (FERROUSUL) 325 (65 FE) MG tablet Take 1 tablet (325 mg total) by mouth 3 (three) times daily with meals for 14 days.  . [DISCONTINUED] albuterol (VENTOLIN HFA) 108 (90 Base) MCG/ACT inhaler Inhale  2 puffs into the lungs every 6 (six) hours as needed for wheezing or shortness of breath. (Patient not taking: Reported on 04/10/2020)  . [DISCONTINUED] benzonatate (TESSALON) 200 MG capsule Take 1 capsule (200 mg total) by mouth 2 (two) times daily as needed for cough. (Patient not taking: Reported on 04/10/2020)  . [DISCONTINUED] HYDROcodone-acetaminophen (NORCO) 7.5-325 MG tablet Take 1-2 tablets by mouth every 4 (four) hours as needed for moderate pain. (Patient not taking: Reported on 04/10/2020)  . [DISCONTINUED] methocarbamol (ROBAXIN) 500 MG tablet Take 1 tablet (500 mg total) by mouth every 6 (six) hours as needed for muscle spasms. (Patient not taking: Reported on 04/10/2020)  . [DISCONTINUED] predniSONE (DELTASONE) 10 MG tablet 40 mg x 3 days, 20 mg x 3 days, 10 mg x 3 days (Patient not taking: Reported on 04/10/2020)   No facility-administered encounter medications on file as of 04/10/2020.    Allergies (verified) Otezla [apremilast], Penicillins, Erythromycin, Morphine and related, Nsaids, Remicade [infliximab], Tolmetin, and Nickel   History: Past Medical History:  Diagnosis Date  . Allergic rhinitis   . Colitis   . Complication of anesthesia    vomit x1 while on Morphine per pt  . Depression   . Diverticulosis   . Frequency of urination   . GERD (gastroesophageal reflux disease)   . History of colon polyps   . 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 History of hiatal hernia   . History of iron deficiency anemia 1996   iron infusion  . History of migraine headaches   . History of rib fracture   . Hyperlipidemia   . IBS (irritable bowel syndrome)   . Insomnia   . MCI (mild cognitive impairment)   . Melanoma ([place]) 2019   left leg   . Migraine headache    hx of migraines   . OA (osteoarthritis)    RIGHT SHOULDER  . Pneumonia    remote history  . PONV (postoperative nausea and vomiting)   . 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  Right rotator cuff tear   . Unspecified essential hypertension    Past Surgical History:  Procedure Laterality Date  . BUNIONECTOMY/  HAMMERTOE CORRECTION  RIGHT FOOT  2011  . CATARACT EXTRACTION W/ INTRAOCULAR LENS  IMPLANT, BILATERAL    . COLONOSCOPY    . KNEE ARTHROSCOPY W/ MENISCECTOMY Bilateral X2  LEFT /    X1  RIGHT  . KNEE OPEN LATERAL RELEASE Bilateral   . MOHS SURGERY Left 12/15/2017   Melanoma in situ - left calf - [facility]  . REPLACEMENT TOTAL KNEE Left 2006  . SHOULDER ARTHROSCOPY WITH SUBACROMIAL DECOMPRESSION, ROTATOR CUFF REPAIR AND BICEP TENDON REPAIR Right 05/23/2013   Procedure: RIGHT SHOULDER ARTHROSCOPY EXAM UNDER ANESTHESIA  WITH SUBACROMIAL DECOMPRESSION,DISTAL CLAVICLE RESECTION, SADLABRAL DEBRIDEMENT CHONDROPLASTY, BICEP TENOTOMY ;  Surgeon: Sydnee Cabal, MD;  Location: [facility];  Service: Orthopedics;  Laterality: Right;  . TONSILLECTOMY AND ADENOIDECTOMY  AGE 64  . TOTAL HIP ARTHROPLASTY Left 05/04/2017   Procedure: LEFT TOTAL HIP ARTHROPLASTY ANTERIOR APPROACH;  Surgeon: Paralee Cancel, MD;  Location: [facility];  Service: Orthopedics;  Laterality: Left;  . TOTAL HIP ARTHROPLASTY Right 08/01/2019   Procedure: TOTAL HIP ARTHROPLASTY ANTERIOR APPROACH;  Surgeon: Paralee Cancel, MD;  Location: [facility];  Service: Orthopedics;  Laterality: Right;  58mins  . TOTAL HIP ARTHROPLASTY Right 08/01/2019  . TOTAL KNEE ARTHROPLASTY Bilateral LEFT  1996/   RIGHT 2004  . 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 UPPER GI ENDOSCOPY    . VAGINAL HYSTERECTOMY  1976   Family History  Problem Relation Age of Onset  . Heart disease Father 19  . Hypertension Father   . Melanoma Mother   . Cancer Sister        skin CA, 2 sisters  . Esophageal cancer Brother   . Dementia Paternal Grandfather   . Colon cancer Neg Hx   . Colon polyps Neg Hx   . Stomach cancer Neg Hx   . Rectal cancer Neg Hx    Social History   Socioeconomic History  . Marital status: Widowed     Spouse name: Not on file  . Number of children: Not on file  . Years of education: Not on file  . Highest education level: Master's degree (e.g., MA, MS, MEng, MEd, MSW, MBA)  Occupational History  . Not on file  Tobacco Use  . Smoking status: Former Smoker    Packs/day: 0.50    Years: 15.00    Pack years: 7.50    Types: Cigarettes    Quit date: 05/15/1985    Years since quitting: 34.9  . Smokeless tobacco: Never Used  Vaping Use  . Vaping Use: Never used  Substance and Sexual Activity  . Alcohol use: No    Comment: RECOVERING ALCOHOLIC--  QUIT [phone]  . Drug use: No  . Sexual activity: Never    Birth control/protection: Post-menopausal  Other Topics Concern  . 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 Difficulty of Paying Living Expenses: Not hard at all  Food Insecurity: No Food Insecurity  . Worried About Charity fundraiser in the Last Year: Never true  . Ran Out of Food in the Last Year: Never true  Transportation Needs: No Transportation Needs  . Lack of Transportation (Medical): No  . Lack of Transportation (Non-Medical): No  Physical Activity: Insufficiently Active  . Days of Exercise per Week: 7 days  . Minutes of Exercise per Session: 20 min  Stress: No Stress Concern Present  . Feeling of Stress : Only a little  Social Connections: Moderately Isolated  . Frequency of Communication with Friends and Family: More than three times a week  . Frequency of Social Gatherings with Friends and Family: More than three times a week  . Attends Religious Services: Never  . Active Member of Clubs or Organizations: Yes  . Attends Archivist Meetings: 1 to 4 times per year  . Marital Status: Widowed    Tobacco Counseling Counseling given: Not  Answered   Clinical Intake:  Pre-visit preparation completed: Yes  Pain : No/denies pain     BMI -  recorded: 29.89 Nutritional Status: BMI 25 -29 Overweight Nutritional Risks: None Diabetes: No  How often do you need to have someone help you when you read instructions, pamphlets, or other written materials from your doctor or pharmacy?: 1 - Never  Diabetic?No  Interpreter Needed?: No  Information entered by :: Charlott Rakes, LPN   Activities of Daily Living In your present state of health, do you have any difficulty performing the following activities: 04/10/2020 08/01/2019  Hearing? Y -  Comment hearing aids -  Vision? N -  Difficulty concentrating or making decisions? N -  Walking or climbing stairs? Y -  Comment SOB at times -  Dressing or bathing? N -  Doing errands, shopping? N N  Preparing Food and eating ? N -  Using the Toilet? N -  Managing your Medications? N -  Managing your Finances? N -  Housekeeping or managing your Housekeeping? N -  Some recent data might be hidden    Patient Care Team: [name], NP as PCP - General (Family Medicine) [name], MD (Inactive) as Consulting Physician (Urology) Earnie Larsson, [facility] as Pharmacist (Pharmacist) Ortho, [facility] (Specialist)  Indicate any recent Medical Services you may have received from other than [facility] providers in the past year (date may be approximate).     Assessment:   This is a routine wellness examination for Tonya Bartlett.  Hearing/Vision screen  Hearing Screening   125Hz  250Hz  500Hz  1000Hz  2000Hz  3000Hz  4000Hz  6000Hz  8000Hz   Right ear:           Left ear:           Comments: Pt has hearing aids   Vision Screening Comments: Pt follows up with Dr Sabra Heck for annual eye exams  Dietary issues and exercise activities discussed: Current Exercise Habits: Home exercise routine, Type of exercise: walking, Time (Minutes): 20, Frequency (Times/Week): 7, Weekly Exercise (Minutes/Week):  140  Goals    . Patient Stated     Lose weight     . Pharmacy Care Plan     CARE PLAN ENTRY (see longitudinal plan of care for additional care plan information)  Current Barriers:  . Chronic Disease Management support, education, and care coordination needs related to Hypertension, Hyperlipidemia, Depression, and Osteopenia   Hypertension BP Readings from Last 3 Encounters:  09/26/19 131/81  08/02/19 139/63  07/25/19 (!) 165/68   . Pharmacist Clinical Goal(s): o Over the next 180 days, patient will work with PharmD and providers to maintain BP goal <130/80 . Current regimen:  o Losartan 50mg , 1 tablet once daily  . Interventions: . Recommend: DASH diet:  following a diet emphasizing fruits and vegetables and low-fat dairy products along with whole grains, fish, poultry, and nuts. Reducing red meats and sugars.  . Reducing the amount of salt intake to 1500mg /per day.  . Recommend using a salt substitute to replace your salt if you need flavor.  . Patient self care activities - Over the next 180 days, patient will: o Check BP 1 to 2 times per week, document, and provide at future appointments o Ensure daily salt intake < 2300 mg/day  Hyperlipidemia Lab Results  Component Value Date/Time   LDLCALC 100 (H) 02/03/2019 11:08 AM   LDLDIRECT 187.9 10/08/2009 09:25 AM   . Pharmacist Clinical Goal(s): o Over the next 180 days, patient will work with PharmD and providers to maintain LDL goal < 100 . Current regimen:  o Atorvastatin 20mg , 1 tablet once daily at 6pm  . Interventions: . How  to reduce cholesterol through diet/weight management and physical activity.    . We discussed how a diet high in plant sterols (fruits/vegetables/nuts/whole grains/legumes) may reduce your cholesterol.  Encouraged increasing fiber to a daily intake of 10-25g/day  . Patient self care activities - Over the next 180 days, patient will: o Continue current medications and lifestyle modifications (diet  and exercise).   GERD  . Pharmacist Clinical Goal(s) o Over the next 180 days, patient will work with PharmD and providers to decrease acid reflux symptoms.  . Current regimen:  o Famotidine 20mg , 1 tablet once daily . Interventions: o We discussed: non-pharmacological interventions for acid reflux. Take measures to prevent acid reflux, such as avoiding spicy foods, avoiding caffeine, avoid laying down a few hours after eating, and raising the head of the bed. . Patient self care activities - Over the next 180 days, patient will: o Continue current medications as directed by provider.   Depression . Pharmacist Clinical Goal(s) o Over the next 180 days, patient will work with PharmD and providers to improve mood symptoms  . Current regimen:  o Escitalopram 10mg , 1 tablet once daily  . Patient self care activities o Patient will continue current medications as directed by provider.   Medication management . Pharmacist Clinical Goal(s): o Over the next 180 days, patient will work with PharmD and providers to maintain optimal medication adherence . Current pharmacy: [facility] . Interventions o Comprehensive medication review performed. o Continue current medication management strategy . Patient self care activities - Over the next 180 days, patient will: o Take medications as prescribed o Report any questions or concerns to PharmD and/or provider(s)  Initial goal documentation       Depression Screen PHQ 2/9 Scores 04/10/2020 11/23/2017 12/24/2015 10/13/2013  PHQ - 2 Score 1 2 0 0  Some encounter information is confidential and restricted. Go to Review Flowsheets activity to see all data.    Fall Risk Fall Risk  04/10/2020 09/26/2019 01/18/2019 01/11/2019 11/23/2017  Falls in the past year? 1 1 1 1  Yes  Comment - - - Emmi Telephone Survey: data to providers prior to load -  Number falls in past yr: 1 0 1 1 2  or more  Comment - - - Emmi Telephone Survey Actual Response = 3 3 total :  fell down stairs one time and injured her elbow - seeing Ortho this week  Injury with Fall? 0 0 - 0 Yes  Risk for fall due to : Impaired vision;Impaired balance/gait - - - -  Follow up Falls prevention discussed - - - -    FALL RISK PREVENTION PERTAINING TO THE HOME:  Any stairs in or around the home? Yes  If so, are there any without handrails? No  Home free of loose throw rugs in walkways, pet beds, electrical cords, etc? Yes  Adequate lighting in your home to reduce risk of falls? Yes   ASSISTIVE DEVICES UTILIZED TO PREVENT FALLS:  Life alert? No  has an apple watch  Use of a cane, walker or w/c? No  Grab bars in the bathroom? No  Shower chair or bench in shower? No  Elevated toilet seat or a handicapped toilet? No   TIMED UP AND GO:  Was the test performed? No .    Cognitive Function: MMSE - Mini Mental State Exam 09/26/2019 08/30/2017 06/08/2016  Orientation to time 5 5 5   Orientation to Place 4 5 5   Registration 3 3 3   Attention/ Calculation 5 5 5  Recall 3 3 3   Language- name 2 objects 2 2 2   Language- repeat 1 1 1   Language- follow 3 step command 3 3 3   Language- read & follow direction 1 1 1   Write a sentence 1 1 1   Copy design 1 1 1   Total score 29 30 30      6CIT Screen 04/10/2020  What Year? 0 points  What month? 0 points  Count back from 20 0 points  Months in reverse 0 points  Repeat phrase 2 points    Immunizations Immunization History  Administered Date(s) Administered  . Fluad Quad(high Dose 65+) 10/31/2018  . Influenza Split 11/17/2011  . Influenza Whole 10/15/2009  . Influenza, High Dose Seasonal PF 01/02/2014, 11/06/2014, 11/15/2015, 11/05/2016, 12/02/2017  . Influenza-Unspecified 10/17/2012  . PFIZER(Purple Top)SARS-COV-2 Vaccination 03/25/2019, 04/18/2019, 11/03/2019  . Pneumococcal Conjugate-13 01/02/2014  . Pneumococcal Polysaccharide-23 10/15/2009  . Td 08/24/2006  . Zoster 02/23/2015  . Zoster Recombinat (Shingrix) 12/07/2018,  02/15/2019    TDAP status: Due, Education has been provided regarding the importance of this vaccine. Advised may receive this vaccine at local pharmacy or Health Dept. Aware to provide a copy of the vaccination record if obtained from local pharmacy or Health Dept. Verbalized acceptance and understanding.  Flu Vaccine status: Due, Education has been provided regarding the importance of this vaccine. Advised may receive this vaccine at local pharmacy or Health Dept. Aware to provide a copy of the vaccination record if obtained from local pharmacy or Health Dept. Verbalized acceptance and understanding.  Pneumococcal vaccine status: Up to date  Covid-19 vaccine status: Completed vaccines  Qualifies for Shingles Vaccine? Yes   Zostavax completed Yes   Shingrix Completed?: Yes  Screening Tests Health Maintenance  Topic Date Due  . TETANUS/TDAP  08/23/2016  . INFLUENZA VACCINE  09/17/2019  . COVID-19 Vaccine (4 - Booster for Pfizer series) 05/02/2020  . COLONOSCOPY (Pts 45-50yrs Insurance coverage will need to be confirmed)  03/15/2021  . DEXA SCAN  Completed  . Hepatitis C Screening  Completed  . PNA vac Low Risk Adult  Completed    Health Maintenance  Health Maintenance Due  Topic Date Due  . TETANUS/TDAP  08/23/2016  . INFLUENZA VACCINE  09/17/2019    Colorectal cancer screening: Type of screening: Colonoscopy. Completed 03/15/18. Repeat every 3 years  Mammogram status: Completed 04/11/19. Repeat every year  Bone Density status: Completed 04/01/18. Results reflect: Bone density results: OSTEOPENIA. Repeat every 2 years.   Additional Screening:  Hepatitis C Screening:  Completed 11/05/16  Vision Screening: Recommended annual ophthalmology exams for early detection of glaucoma and other disorders of the eye. Is the patient up to date with their annual eye exam?  Yes  Who is the provider or what is the name of the office in which the patient attends annual eye exams? Dr Sabra Heck   If pt is not established with a provider, would they like to be referred to a provider to establish care? No .   Dental Screening: Recommended annual dental exams for proper oral hygiene  Community Resource Referral / Chronic Care Management: CRR required this visit?  No   CCM required this visit?  No      Plan:     I have personally reviewed and noted the following in the patient's chart:   . Medical and social history . Use of alcohol, tobacco or illicit drugs  . Current medications and supplements . Functional ability and status . Nutritional status . Physical activity .  Advanced directives . List of other physicians . Hospitalizations, surgeries, and ER visits in previous 12 months . Vitals . Screenings to include cognitive, depression, and falls . Referrals and appointments  In addition, I have reviewed and discussed with patient certain preventive protocols, quality metrics, and best practice recommendations. A written personalized care plan for preventive services as well as general preventive health recommendations were provided to patient.     Willette Brace, LPN   [id number]   Nurse Notes: None

## 2020-04-10 NOTE — Patient Instructions (Addendum)
Tonya Bartlett , Thank you for taking time to come for your Medicare Wellness Visit. I appreciate your ongoing commitment to your health goals. Please review the following plan we discussed and let me know if I can assist you in the future.   Screening recommendations/referrals: Colonoscopy: Done 03/15/18 Mammogram: Done 04/11/19 Bone Density: Done 04/01/18 Recommended yearly ophthalmology/optometry visit for glaucoma screening and checkup Recommended yearly dental visit for hygiene and checkup  Vaccinations: Influenza vaccine: Due and discussed Pneumococcal vaccine: Up to date Tdap vaccine: Due and discussed Shingles vaccine: Completed   12/07/18 & 02/15/19 Covid-19:Completed 03/25/19, 04/18/19 & 11/03/19  Advanced directives: Advance directive discussed with you today. I have provided a copy for you to complete at home and have notarized. Once this is complete please bring a copy in to our office so we can scan it into your chart.  Conditions/risks identified: Lose weight   Next appointment: Follow up in one year for your annual wellness visit    Preventive Care 76 Years and Older, Female Preventive care refers to lifestyle choices and visits with your health care provider that can promote health and wellness. What does preventive care include?  A yearly physical exam. This is also called an annual well check.  Dental exams once or twice a year.  Routine eye exams. Ask your health care provider how often you should have your eyes checked.  Personal lifestyle choices, including:  Daily care of your teeth and gums.  Regular physical activity.  Eating a healthy diet.  Avoiding tobacco and drug use.  Limiting alcohol use.  Practicing safe sex.  Taking low-dose aspirin every day.  Taking vitamin and mineral supplements as recommended by your health care provider. What happens during an annual well check? The services and screenings done by your health care provider during your  annual well check will depend on your age, overall health, lifestyle risk factors, and family history of disease. Counseling  Your health care provider may ask you questions about your:  Alcohol use.  Tobacco use.  Drug use.  Emotional well-being.  Home and relationship well-being.  Sexual activity.  Eating habits.  History of falls.  Memory and ability to understand (cognition).  Work and work Statistician.  Reproductive health. Screening  You may have the following tests or measurements:  Height, weight, and BMI.  Blood pressure.  Lipid and cholesterol levels. These may be checked every 5 years, or more frequently if you are over 33 years old.  Skin check.  Lung cancer screening. You may have this screening every year starting at age 76 if you have a 30-pack-year history of smoking and currently smoke or have quit within the past 15 years.  Fecal occult blood test (FOBT) of the stool. You may have this test every year starting at age 76.  Flexible sigmoidoscopy or colonoscopy. You may have a sigmoidoscopy every 5 years or a colonoscopy every 10 years starting at age 37.  Hepatitis C blood test.  Hepatitis B blood test.  Sexually transmitted disease (STD) testing.  Diabetes screening. This is done by checking your blood sugar (glucose) after you have not eaten for a while (fasting). You may have this done every 1-3 years.  Bone density scan. This is done to screen for osteoporosis. You may have this done starting at age 76.  Mammogram. This may be done every 1-2 years. Talk to your health care provider about how often you should have regular mammograms. Talk with your health care provider about  your test results, treatment options, and if necessary, the need for more tests. Vaccines  Your health care provider may recommend certain vaccines, such as:  Influenza vaccine. This is recommended every year.  Tetanus, diphtheria, and acellular pertussis (Tdap, Td)  vaccine. You may need a Td booster every 10 years.  Zoster vaccine. You may need this after age 76.  Pneumococcal 13-valent conjugate (PCV13) vaccine. One dose is recommended after age 36.  Pneumococcal polysaccharide (PPSV23) vaccine. One dose is recommended after age 63. Talk to your health care provider about which screenings and vaccines you need and how often you need them. This information is not intended to replace advice given to you by your health care provider. Make sure you discuss any questions you have with your health care provider. Document Released: 03/01/2015 Document Revised: 10/23/2015 Document Reviewed: 12/04/2014 Elsevier Interactive Patient Education  2017 [employer] Prevention in the Home Falls can cause injuries. They can happen to people of all ages. There are many things you can do to make your home safe and to help prevent falls. What can I do on the outside of my home?  Regularly fix the edges of walkways and driveways and fix any cracks.  Remove anything that might make you trip as you walk through a door, such as a raised step or threshold.  Trim any bushes or trees on the path to your home.  Use bright outdoor lighting.  Clear any walking paths of anything that might make someone trip, such as rocks or tools.  Regularly check to see if handrails are loose or broken. Make sure that both sides of any steps have handrails.  Any raised decks and porches should have guardrails on the edges.  Have any leaves, snow, or ice cleared regularly.  Use sand or salt on walking paths during winter.  Clean up any spills in your garage right away. This includes oil or grease spills. What can I do in the bathroom?  Use night lights.  Install grab bars by the toilet and in the tub and shower. Do not use towel bars as grab bars.  Use non-skid mats or decals in the tub or shower.  If you need to sit down in the shower, use a plastic, non-slip  stool.  Keep the floor dry. Clean up any water that spills on the floor as soon as it happens.  Remove soap buildup in the tub or shower regularly.  Attach bath mats securely with double-sided non-slip rug tape.  Do not have throw rugs and other things on the floor that can make you trip. What can I do in the bedroom?  Use night lights.  Make sure that you have a light by your bed that is easy to reach.  Do not use any sheets or blankets that are too big for your bed. They should not hang down onto the floor.  Have a firm chair that has side arms. You can use this for support while you get dressed.  Do not have throw rugs and other things on the floor that can make you trip. What can I do in the kitchen?  Clean up any spills right away.  Avoid walking on wet floors.  Keep items that you use a lot in easy-to-reach places.  If you need to reach something above you, use a strong step stool that has a grab bar.  Keep electrical cords out of the way.  Do not use floor polish or wax  that makes floors slippery. If you must use wax, use non-skid floor wax.  Do not have throw rugs and other things on the floor that can make you trip. What can I do with my stairs?  Do not leave any items on the stairs.  Make sure that there are handrails on both sides of the stairs and use them. Fix handrails that are broken or loose. Make sure that handrails are as long as the stairways.  Check any carpeting to make sure that it is firmly attached to the stairs. Fix any carpet that is loose or worn.  Avoid having throw rugs at the top or bottom of the stairs. If you do have throw rugs, attach them to the floor with carpet tape.  Make sure that you have a light switch at the top of the stairs and the bottom of the stairs. If you do not have them, ask someone to add them for you. What else can I do to help prevent falls?  Wear shoes that:  Do not have high heels.  Have rubber bottoms.  Are  comfortable and fit you well.  Are closed at the toe. Do not wear sandals.  If you use a stepladder:  Make sure that it is fully opened. Do not climb a closed stepladder.  Make sure that both sides of the stepladder are locked into place.  Ask someone to hold it for you, if possible.  Clearly mark and make sure that you can see:  Any grab bars or handrails.  First and last steps.  Where the edge of each step is.  Use tools that help you move around (mobility aids) if they are needed. These include:  Canes.  Walkers.  Scooters.  Crutches.  Turn on the lights when you go into a dark area. Replace any light bulbs as soon as they burn out.  Set up your furniture so you have a clear path. Avoid moving your furniture around.  If any of your floors are uneven, fix them.  If there are any pets around you, be aware of where they are.  Review your medicines with your doctor. Some medicines can make you feel dizzy. This can increase your chance of falling. Ask your doctor what other things that you can do to help prevent falls. This information is not intended to replace advice given to you by your health care provider. Make sure you discuss any questions you have with your health care provider. Document Released: 11/29/2008 Document Revised: 07/11/2015 Document Reviewed: 03/09/2014 Elsevier Interactive Patient Education  2017 [employer].

## 2020-04-22 ENCOUNTER — Other Ambulatory Visit: Payer: Self-pay

## 2020-04-23 ENCOUNTER — Other Ambulatory Visit: Payer: Self-pay | Admitting: Adult Health

## 2020-04-23 ENCOUNTER — Encounter: Payer: Self-pay | Admitting: Adult Health

## 2020-04-23 ENCOUNTER — Ambulatory Visit (INDEPENDENT_AMBULATORY_CARE_PROVIDER_SITE_OTHER): Payer: Medicare Other | Admitting: Adult Health

## 2020-04-23 VITALS — BP 146/92 | HR 57 | Temp 98.0°F | Ht 65.0 in | Wt 208.0 lb

## 2020-04-23 DIAGNOSIS — G2581 Restless legs syndrome: Secondary | ICD-10-CM | POA: Diagnosis not present

## 2020-04-23 DIAGNOSIS — G3184 Mild cognitive impairment, so stated: Secondary | ICD-10-CM

## 2020-04-23 DIAGNOSIS — I10 Essential (primary) hypertension: Secondary | ICD-10-CM

## 2020-04-23 DIAGNOSIS — K219 Gastro-esophageal reflux disease without esophagitis: Secondary | ICD-10-CM | POA: Diagnosis not present

## 2020-04-23 DIAGNOSIS — G4700 Insomnia, unspecified: Secondary | ICD-10-CM

## 2020-04-23 DIAGNOSIS — C4372 Malignant melanoma of left lower limb, including hip: Secondary | ICD-10-CM | POA: Diagnosis not present

## 2020-04-23 DIAGNOSIS — Z23 Encounter for immunization: Secondary | ICD-10-CM | POA: Diagnosis not present

## 2020-04-23 DIAGNOSIS — E782 Mixed hyperlipidemia: Secondary | ICD-10-CM

## 2020-04-23 NOTE — Progress Notes (Addendum)
Subjective:    Patient ID: Tonya Bartlett, female    DOB: 1945-03-13, 75 y.o.   MRN: [id number]  HPI Patient presents for yearly preventative medicine examination. She is a pleasant 75 year old female who  has a past medical history of Allergic rhinitis, Colitis, Complication of anesthesia, Depression, Diverticulosis, Frequency of urination, GERD (gastroesophageal reflux disease), History of colon polyps, History of DVT of lower extremity, History of hiatal hernia, History of iron deficiency anemia (1996), History of migraine headaches, History of rib fracture, Hyperlipidemia, IBS (irritable bowel syndrome), Insomnia, MCI (mild cognitive impairment), Melanoma ([place]) (2019), Migraine headache, OA (osteoarthritis), Pneumonia, PONV (postoperative nausea and vomiting), Recovering alcoholic ([place]), Right rotator cuff tear, and Unspecified essential hypertension.   Since we last saw each other she has retired from her job as a Social worker.  She is enjoying retirement and has been doing quite a bit of traveling.  HTN -is prescribed Cozaar 50 mg daily.  She denies dizziness, lightheadedness, blurred vision, headaches, or syncopal episodes. She has been monitoring her blood pressure at home and reports readings consistently in the 150-160's.  BP Readings from Last 3 Encounters:  04/23/20 (!) 146/92  03/01/20 (!) 176/100  09/26/19 131/81   Hyperlipidemia-currently prescribed Lipitor 20 mg daily.  She denies myalgia or fatigue   Lab Results  Component Value Date   CHOL 165 02/03/2019   HDL 46.40 02/03/2019   LDLCALC 100 (H) 02/03/2019   LDLDIRECT 187.9 10/08/2009   TRIG 89.0 02/03/2019   CHOLHDL 4 02/03/2019   History of melanoma-diagnosed in October 2019.  She is seen by dermatology on a routine basis  Mild cognitive impairment-followed by neurology.  Continues to have short-term memory difficulties as well as occasional word finding difficulties.  Early prescribed  Cerefolin  Anxiety/depression-seen by behavioral health and currently prescribed Lexapro 10 mg.  Feels controlled on this medication  Insomnia-trazodone as needed  Restless Leg Syndrome -prescribed gabapentin 300 mg twice daily by neurology.  She has not noticed much improvement.  Plans on weaning herself off this medication due to weight gain  Left Sided Breast Pain - has occurred recently.  Has been doing home breast exams and has not noticed any lumps or masses.  She does have an upcoming mammogram due.  All immunizations and health maintenance protocols were reviewed with the patient and needed orders were placed.  Appropriate screening laboratory values were ordered for the patient including screening of hyperlipidemia, renal function and hepatic function. If indicated by BPH, a PSA was ordered.  Medication reconciliation,  past medical history, social history, problem list and allergies were reviewed in detail with the patient  Goals were established with regard to weight loss, exercise, and  diet in compliance with medications  Wt Readings from Last 3 Encounters:  04/23/20 208 lb (94.3 kg)  02/28/20 188 lb (85.3 kg)  09/26/19 188 lb (85.3 kg)    Review of Systems  Constitutional: Negative.   HENT: Negative.   Eyes: Negative.   Respiratory: Negative.   Cardiovascular: Negative.   Gastrointestinal: Negative.   Endocrine: Negative.   Genitourinary: Negative.   Musculoskeletal: Negative.   Skin: Negative.   Allergic/Immunologic: Negative.   Neurological: Positive for tremors.  Hematological: Negative.   Psychiatric/Behavioral: Positive for confusion and sleep disturbance.   Past Medical History:  Diagnosis Date  . Allergic rhinitis   . Colitis   . Complication of anesthesia    vomit x1 while on Morphine per pt  . Depression   . Diverticulosis   .  Frequency of urination   . GERD (gastroesophageal reflux disease)   . History of colon polyps   . History of DVT of  lower extremity    POST LEFT TOTAL KNEE  1996  . History of hiatal hernia   . History of iron deficiency anemia 1996   iron infusion  . History of migraine headaches   . History of rib fracture   . Hyperlipidemia   . IBS (irritable bowel syndrome)   . Insomnia   . MCI (mild cognitive impairment)   . Melanoma ([place]) 2019   left leg   . Migraine headache    hx of migraines   . OA (osteoarthritis)    RIGHT SHOULDER  . Pneumonia    remote history  . PONV (postoperative nausea and vomiting)   . Recovering alcoholic ([place])    SINCE [id number]  . Right rotator cuff tear   . Unspecified essential hypertension     Social History   Socioeconomic History  . Marital status: Widowed    Spouse name: Not on file  . Number of children: Not on file  . Years of education: Not on file  . Highest education level: Master's degree (e.g., MA, MS, MEng, MEd, MSW, MBA)  Occupational History  . Not on file  Tobacco Use  . Smoking status: Former Smoker    Packs/day: 0.50    Years: 15.00    Pack years: 7.50    Types: Cigarettes    Quit date: 05/15/1985    Years since quitting: 34.9  . Smokeless tobacco: Never Used  Vaping Use  . Vaping Use: Never used  Substance and Sexual Activity  . Alcohol use: No    Comment: RECOVERING ALCOHOLIC--  QUIT [id number]  . Drug use: No  . Sexual activity: Never    Birth control/protection: Post-menopausal  Other Topics Concern  . 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 Difficulty of Paying Living Expenses: Not hard at all  Food Insecurity: No Food Insecurity  . Worried About Charity fundraiser in the Last Year: Never true  . Ran Out of Food in the Last Year: Never true  Transportation Needs: No Transportation Needs  . Lack of Transportation  (Medical): No  . Lack of Transportation (Non-Medical): No  Physical Activity: Insufficiently Active  . Days of Exercise per Week: 7 days  . Minutes of Exercise per Session: 20 min  Stress: No Stress Concern Present  . Feeling of Stress : Only a little  Social Connections: Moderately Isolated  . Frequency of Communication with Friends and Family: More than three times a week  . Frequency of Social Gatherings with Friends and Family: More than three times a week  . Attends Religious Services: Never  . Active Member of Clubs or Organizations: Yes  . Attends Archivist Meetings: 1 to 4 times per year  . Marital Status: Widowed  Intimate Partner Violence: Not At Risk  . Fear of Current or Ex-Partner: No  . Emotionally Abused: No  . Physically Abused: No  . Sexually Abused: No    Past Surgical History:  Procedure Laterality Date  . BUNIONECTOMY/  HAMMERTOE CORRECTION  RIGHT FOOT  2011  . CATARACT EXTRACTION W/ INTRAOCULAR  LENS  IMPLANT, BILATERAL    . COLONOSCOPY    . KNEE ARTHROSCOPY W/ MENISCECTOMY Bilateral X2  LEFT /    X1  RIGHT  . KNEE OPEN LATERAL RELEASE Bilateral   . MOHS SURGERY Left 12/15/2017   Melanoma in situ - [facility] - [facility]  . REPLACEMENT TOTAL KNEE Left 2006  . SHOULDER ARTHROSCOPY WITH SUBACROMIAL DECOMPRESSION, ROTATOR CUFF REPAIR AND BICEP TENDON REPAIR Right 05/23/2013   Procedure: RIGHT SHOULDER ARTHROSCOPY EXAM UNDER ANESTHESIA  WITH SUBACROMIAL DECOMPRESSION,DISTAL CLAVICLE RESECTION, SADLABRAL DEBRIDEMENT CHONDROPLASTY, BICEP TENOTOMY ;  Surgeon: Sydnee Cabal, MD;  Location: [facility];  Service: Orthopedics;  Laterality: Right;  . TONSILLECTOMY AND ADENOIDECTOMY  AGE 49  . TOTAL HIP ARTHROPLASTY Left 05/04/2017   Procedure: LEFT TOTAL HIP ARTHROPLASTY ANTERIOR APPROACH;  Surgeon: Paralee Cancel, MD;  Location: [facility];  Service: Orthopedics;  Laterality: Left;  . TOTAL HIP ARTHROPLASTY Right 08/01/2019   Procedure: TOTAL  HIP ARTHROPLASTY ANTERIOR APPROACH;  Surgeon: Paralee Cancel, MD;  Location: [facility];  Service: Orthopedics;  Laterality: Right;  76mins  . TOTAL HIP ARTHROPLASTY Right 08/01/2019  . TOTAL KNEE ARTHROPLASTY Bilateral LEFT  1996/   RIGHT 2004  . 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 UPPER GI ENDOSCOPY    . VAGINAL HYSTERECTOMY  1976    Family History  Problem Relation Age of Onset  . Heart disease Father 79  . Hypertension Father   . Melanoma Mother   . Cancer Sister        skin CA, 2 sisters  . Esophageal cancer Brother   . Dementia Paternal Grandfather   . Colon cancer Neg Hx   . Colon polyps Neg Hx   . Stomach cancer Neg Hx   . Rectal cancer Neg Hx     Allergies  Allergen Reactions  . [place] Nail [Apremilast] Anaphylaxis    Suicidal ideation  . Penicillins Anaphylaxis    Has patient had a PCN reaction causing immediate rash, facial/tongue/throat swelling, SOB or lightheadedness with hypotension: Yes Has patient had a PCN reaction causing severe rash involving mucus membranes or skin necrosis: Yes Has patient had a PCN reaction that required hospitalization: No Has patient had a PCN reaction occurring within the last 10 year No If all of the above answers are "NO", then may proceed with Cephalosporin use.   . Erythromycin Other (See Comments)    SEVERE STOMACH CRAMPS  . Morphine And Related Nausea And Vomiting  . Nsaids Other (See Comments)    SEVERE STOMACH CRAMPS, MOUTH SORES **Able to tolerate Tylenol  . Remicade [Infliximab] Other (See Comments)    Shut down immune system-BP high  . Tolmetin     SEVERE STOMACH CRAMPS  . Nickel Rash    Including snaps on hospital gowns     Current Outpatient Medications on File Prior to Visit  Medication Sig Dispense Refill  . atorvastatin (LIPITOR) 20 MG tablet Take 1 tablet (20 mg total) by mouth daily at 6 PM. 90 tablet 3  . betamethasone dipropionate 0.05 % lotion APPLY EXTERNALLY TO THE AFFECTED AREA DAILY 60 mL 2  .  Calcium-Magnesium-Zinc (CAL-MAG-ZINC PO) Take 1 tablet by mouth every evening.    . Cholecalciferol (VITAMIN D3) 125 MCG (5000 UT) TABS Take 5,000 Units by mouth every evening.    . clindamycin (CLEOCIN) 300 MG capsule clindamycin HCl 300 mg capsule  TAKE 3 CAPSULES BY MOUTH 1 HOUR BEFORE DENTAL APPOINTMENT    . escitalopram (  LEXAPRO) 10 MG tablet TAKE 1 TABLET(10 MG) BY MOUTH DAILY 90 tablet 0  . FIBER PO Take 1 capsule by mouth in the morning and at bedtime.    . gabapentin (NEURONTIN) 300 MG capsule Take 1 capsule (300 mg total) by mouth 2 (two) times daily. 90 capsule 11  . HYDROcodone-acetaminophen (NORCO/VICODIN) 5-325 MG tablet Take 1 tablet by mouth every 6 (six) hours as needed.    . hydrocortisone cream 1 % Apply 1 application topically daily as needed for itching.    Marland Kitchen L-Methylfolate-B12-B6-B2 (CEREFOLIN PO) Take 1 tablet by mouth daily.    Marland Kitchen losartan (COZAAR) 50 MG tablet TAKE 1 TABLET(50 MG) BY MOUTH DAILY 90 tablet 3  . sulfamethoxazole-trimethoprim (BACTRIM,SEPTRA) 400-80 MG tablet Take 1 tablet by mouth daily. For urethritis.    . SUMAtriptan (IMITREX) 100 MG tablet TAKE 1 TABLET BY MOUTH AS NEEDED FOR MIGRAINE. MAY REPEAT DOSE IN 2 HOURS IF HEADACHE PERSISTS OR RECURS 10 tablet 0  . traZODone (DESYREL) 50 MG tablet TAKE 1 TABLET(50 MG) BY MOUTH AT BEDTIME 90 tablet 1  . famotidine (PEPCID) 20 MG tablet Take 1 tablet (20 mg total) by mouth 2 (two) times daily. MUST HAVE OFFICE VISIT FOR FURTHER REFILLS (Patient not taking: No sig reported) 60 tablet 0   No current facility-administered medications on file prior to visit.    BP (!) 146/92 (BP Location: Left Arm, Patient Position: Sitting, Cuff Size: Large)   Pulse (!) 57   Temp 98 F (36.7 C) (Oral)   Ht 5\' 5"  (1.651 m)   Wt 208 lb (94.3 kg)   SpO2 94%   BMI 34.61 kg/m       Objective:   Physical Exam Vitals and nursing note reviewed. Exam conducted with a chaperone present.  Constitutional:      General: She is not  in acute distress.    Appearance: Normal appearance. She is well-developed and overweight. She is not ill-appearing.  HENT:     Head: Normocephalic and atraumatic.     Right Ear: Tympanic membrane, ear canal and external ear normal. There is no impacted cerumen.     Left Ear: Tympanic membrane, ear canal and external ear normal. There is no impacted cerumen.     Nose: Nose normal. No congestion or rhinorrhea.     Mouth/Throat:     Mouth: Mucous membranes are moist.     Pharynx: Oropharynx is clear. No oropharyngeal exudate or posterior oropharyngeal erythema.  Eyes:     General:        Right eye: No discharge.        Left eye: No discharge.     Extraocular Movements: Extraocular movements intact.     Conjunctiva/sclera: Conjunctivae normal.     Pupils: Pupils are equal, round, and reactive to light.  Neck:     Thyroid: No thyromegaly.     Vascular: No carotid bruit.     Trachea: No tracheal deviation.  Cardiovascular:     Rate and Rhythm: Normal rate and regular rhythm.     Pulses: Normal pulses.     Heart sounds: Normal heart sounds. No murmur heard. No friction rub. No gallop.   Pulmonary:     Effort: Pulmonary effort is normal. No respiratory distress.     Breath sounds: Normal breath sounds. No stridor. No wheezing, rhonchi or rales.  Chest:     Chest wall: No mass, deformity, swelling, tenderness or crepitus.  Breasts:     Right: Normal.  Left: Tenderness present. No swelling, bleeding, inverted nipple, mass, nipple discharge or skin change.        Comments: Soft tissue and redness, left lower breast at approximately the 5 o'clock position.  No masses or lumps noticed. Abdominal:     General: Abdomen is flat. Bowel sounds are normal. There is no distension.     Palpations: Abdomen is soft. There is no mass.     Tenderness: There is no abdominal tenderness. There is no right CVA tenderness, left CVA tenderness, guarding or rebound.     Hernia: No hernia is present.   Musculoskeletal:        General: No swelling, tenderness, deformity or signs of injury. Normal range of motion.     Cervical back: Normal range of motion and neck supple.     Right lower leg: No edema.     Left lower leg: No edema.  Lymphadenopathy:     Cervical: No cervical adenopathy.  Skin:    General: Skin is warm and dry.     Coloration: Skin is not jaundiced or pale.     Findings: No bruising, erythema, lesion or rash.  Neurological:     General: No focal deficit present.     Mental Status: She is alert and oriented to person, place, and time.     Cranial Nerves: No cranial nerve deficit.     Sensory: No sensory deficit.     Motor: No weakness.     Coordination: Coordination normal.     Gait: Gait normal.     Deep Tendon Reflexes: Reflexes normal.  Psychiatric:        Mood and Affect: Mood normal.        Behavior: Behavior normal.        Thought Content: Thought content normal.        Judgment: Judgment normal.       Assessment & Plan:  1. Mixed hyperlipidemia - Consider increase in statin  - CBC with Differential/Platelet; Future - Comprehensive metabolic panel; Future - Lipid panel; Future - TSH; Future - TSH - CBC with Differential/Platelet - Comprehensive metabolic panel - Lipid panel  2. Essential hypertension - BP elevated in office and home. Will have her increase Cozaar to 75 mg daily. She will send me her BP results in the next 2 weeks  - CBC with Differential/Platelet; Future - Comprehensive metabolic panel; Future - Lipid panel; Future - TSH; Future - TSH - CBC with Differential/Platelet - Comprehensive metabolic panel - Lipid panel  3. Gastroesophageal reflux disease, unspecified whether esophagitis present - Take Pepcid as needed  4. Malignant melanoma of left lower extremity including hip ([place]) - Follow up with Dermatology as directed  5. MCI (mild cognitive impairment) - Follow up with neurology as directed  6. Insomnia, unspecified  type - continue with Trazodone   7. RLS (restless legs syndrome) - Reviewed slow taper of gabapentin with patient.  - Follow up with neurology as directed   [name], NP

## 2020-04-23 NOTE — Patient Instructions (Signed)
It was great seeing you today   We will follow up with you regarding your blood work   Please increase Losartan to 75 mg ( 1.5 tabs)   Send me your blood pressure your results in the next two weeks

## 2020-04-24 LAB — LIPID PANEL
Cholesterol: 177 mg/dL (ref 0–200)
HDL: 69.4 mg/dL (ref >39.00)
LDL Cholesterol: 92 mg/dL (ref 0–99)
NonHDL: 107.65
Total CHOL/HDL Ratio: 3
Triglycerides: 76 mg/dL (ref 0.0–149.0)
VLDL: 15.2 mg/dL (ref 0.0–40.0)

## 2020-04-24 LAB — CBC WITH DIFFERENTIAL/PLATELET
Basophils Absolute: 0 10*3/uL (ref 0.0–0.1)
Basophils Relative: 0.7 % (ref 0.0–3.0)
Eosinophils Absolute: 0.1 10*3/uL (ref 0.0–0.7)
Eosinophils Relative: 2 % (ref 0.0–5.0)
HCT: 37.9 % (ref 36.0–46.0)
Hemoglobin: 12.8 g/dL (ref 12.0–15.0)
Lymphocytes Relative: 26.8 % (ref 12.0–46.0)
Lymphs Abs: 1.5 10*3/uL (ref 0.7–4.0)
MCHC: 33.6 g/dL (ref 30.0–36.0)
MCV: 98.2 fl (ref 78.0–100.0)
Monocytes Absolute: 0.5 10*3/uL (ref 0.1–1.0)
Monocytes Relative: 8.8 % (ref 3.0–12.0)
Neutro Abs: 3.4 10*3/uL (ref 1.4–7.7)
Neutrophils Relative %: 61.7 % (ref 43.0–77.0)
Platelets: 276 10*3/uL (ref 150.0–400.0)
RBC: 3.86 Mil/uL — ABNORMAL LOW (ref 3.87–5.11)
RDW: 14.2 % (ref 11.5–15.5)
WBC: 5.5 10*3/uL (ref 4.0–10.5)

## 2020-04-24 LAB — COMPREHENSIVE METABOLIC PANEL
ALT: 23 U/L (ref 0–35)
AST: 22 U/L (ref 0–37)
Albumin: 4.3 g/dL (ref 3.5–5.2)
Alkaline Phosphatase: 69 U/L (ref 39–117)
BUN: 9 mg/dL (ref 6–23)
CO2: 28 mEq/L (ref 19–32)
Calcium: 9.4 mg/dL (ref 8.4–10.5)
Chloride: 102 mEq/L (ref 96–112)
Creatinine, Ser: 0.59 mg/dL (ref 0.40–1.20)
GFR: 88.03 mL/min (ref >60.00)
Glucose, Bld: 85 mg/dL (ref 70–99)
Potassium: 4.2 mEq/L (ref 3.5–5.1)
Sodium: 139 mEq/L (ref 135–145)
Total Bilirubin: 1 mg/dL (ref 0.2–1.2)
Total Protein: 7.1 g/dL (ref 6.0–8.3)

## 2020-04-24 LAB — TSH: TSH: 1.68 u[IU]/mL (ref 0.35–4.50)

## 2020-04-25 ENCOUNTER — Telehealth (INDEPENDENT_AMBULATORY_CARE_PROVIDER_SITE_OTHER): Payer: Medicare Other | Admitting: Psychiatry

## 2020-04-25 ENCOUNTER — Encounter (HOSPITAL_COMMUNITY): Payer: Self-pay | Admitting: Psychiatry

## 2020-04-25 DIAGNOSIS — F331 Major depressive disorder, recurrent, moderate: Secondary | ICD-10-CM

## 2020-04-25 MED ORDER — ESCITALOPRAM OXALATE 10 MG PO TABS: 15.0000 mg | ORAL_TABLET | Freq: Every day | ORAL | 0 refills | Status: DC

## 2020-04-25 NOTE — Progress Notes (Signed)
[facility] Outpatient visit Tele psych  Patient Identification: Tonya Bartlett MRN:  [id number] Date of Evaluation:  04/25/2020 Referral Source: NP Chief Complaint:   Depression follow up  Visit Diagnosis:    ICD-10-CM   1. Major depressive disorder, recurrent episode, moderate (HCC)  F33.1     History of Present Illness: 75 years old currently widowed white female lives by herself has 3 grown kids and 6 grandkids referred initially by family medicine for management of depression  Virtual Visit via Telephone Note  I connected with Tonya Bartlett on 04/25/20 at 10:00 AM EST by telephone and verified that I am speaking with the correct person using two identifiers.  [place]  [place]   I discussed the limitations, risks, security and privacy concerns of performing an evaluation and management service by telephone and the availability of in person appointments. I also discussed with the patient that there may be a patient responsible charge related to this service. The patient expressed understanding and agreed to proceed.     I discussed the assessment and treatment plan with the patient. The patient was provided an opportunity to ask questions and all were answered. The patient agreed with the plan and demonstrated an understanding of the instructions.   The patient was advised to call back or seek an in-person evaluation if the symptoms worsen or if the condition fails to improve as anticipated.  I provided 11 minutes of non-face-to-face time during this encounter.   [name], MD  HPI  Remains sober 40 years active in [place] Having some negative thoughts when alone  Does do better with people or around them    Modifying factors : family Aggravating factors.  Hip replacement surgery.  Daughter distant her since patient helped her son    Past Psychiatric History: depression  Previous Psychotropic Medications: Yes   Substance Abuse History in the last  12 months:  No.  Consequences of Substance Abuse: NA  Past Medical History:  Past Medical History:  Diagnosis Date  . Allergic rhinitis   . Colitis   . Complication of anesthesia    vomit x1 while on Morphine per pt  . Depression   . Diverticulosis   . Frequency of urination   . GERD (gastroesophageal reflux disease)   . History of colon polyps   . 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 History of hiatal hernia   . History of iron deficiency anemia 1996   iron infusion  . History of migraine headaches   . History of rib fracture   . Hyperlipidemia   . IBS (irritable bowel syndrome)   . Insomnia   . MCI (mild cognitive impairment)   . Melanoma ([place]) 2019   left leg   . Migraine headache    hx of migraines   . OA (osteoarthritis)    RIGHT SHOULDER  . Pneumonia    remote history  . PONV (postoperative nausea and vomiting)   . Recovering alcoholic ([place])    SINCE [id number]  . Right rotator cuff tear   . Unspecified essential hypertension     Past Surgical History:  Procedure Laterality Date  . BUNIONECTOMY/  HAMMERTOE CORRECTION  RIGHT FOOT  2011  . CATARACT EXTRACTION W/ INTRAOCULAR LENS  IMPLANT, BILATERAL    . COLONOSCOPY    . KNEE ARTHROSCOPY W/ MENISCECTOMY Bilateral X2  LEFT /    X1  RIGHT  . KNEE OPEN  LATERAL RELEASE Bilateral   . MOHS SURGERY Left 12/15/2017   Melanoma in situ - [facility]  . REPLACEMENT TOTAL KNEE Left 2006  . SHOULDER ARTHROSCOPY WITH SUBACROMIAL DECOMPRESSION, ROTATOR CUFF REPAIR AND BICEP TENDON REPAIR Right 05/23/2013   Procedure: RIGHT SHOULDER ARTHROSCOPY EXAM UNDER ANESTHESIA  WITH SUBACROMIAL DECOMPRESSION,DISTAL CLAVICLE RESECTION, SADLABRAL DEBRIDEMENT CHONDROPLASTY, BICEP TENOTOMY ;  Surgeon: Sydnee Cabal, MD;  Location: [place];  Service: Orthopedics;  Laterality: Right;  . TONSILLECTOMY AND ADENOIDECTOMY  AGE 80  . TOTAL HIP ARTHROPLASTY Left 05/04/2017    Procedure: LEFT TOTAL HIP ARTHROPLASTY ANTERIOR APPROACH;  Surgeon: Paralee Cancel, MD;  Location: [facility];  Service: Orthopedics;  Laterality: Left;  . TOTAL HIP ARTHROPLASTY Right 08/01/2019   Procedure: TOTAL HIP ARTHROPLASTY ANTERIOR APPROACH;  Surgeon: Paralee Cancel, MD;  Location: [facility];  Service: Orthopedics;  Laterality: Right;  86mins  . TOTAL HIP ARTHROPLASTY Right 08/01/2019  . TOTAL KNEE ARTHROPLASTY Bilateral LEFT  1996/   RIGHT 2004  . 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 UPPER GI ENDOSCOPY    . VAGINAL HYSTERECTOMY  1976    Family Psychiatric History: mom possible depression  Family History:  Family History  Problem Relation Age of Onset  . Heart disease Father 61  . Hypertension Father   . Melanoma Mother   . Cancer Sister        skin CA, 2 sisters  . Esophageal cancer Brother   . Dementia Paternal Grandfather   . Colon cancer Neg Hx   . Colon polyps Neg Hx   . Stomach cancer Neg Hx   . Rectal cancer Neg Hx     Social History:   Social History   Socioeconomic History  . Marital status: Widowed    Spouse name: Not on file  . Number of children: Not on file  . Years of education: Not on file  . Highest education level: Master's degree (e.g., MA, MS, MEng, MEd, MSW, MBA)  Occupational History  . Not on file  Tobacco Use  . Smoking status: Former Smoker    Packs/day: 0.50    Years: 15.00    Pack years: 7.50    Types: Cigarettes    Quit date: 05/15/1985    Years since quitting: 34.9  . Smokeless tobacco: Never Used  Vaping Use  . Vaping Use: Never used  Substance and Sexual Activity  . Alcohol use: No    Comment: RECOVERING ALCOHOLIC--  QUIT [id number]  . Drug use: No  . Sexual activity: Never    Birth control/protection: Post-menopausal  Other Topics Concern  . 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 Difficulty of Paying Living Expenses: Not hard at all  Food Insecurity: No Food Insecurity  . Worried About Charity fundraiser in the Last Year: Never true  . Ran Out of Food in the Last Year: Never true  Transportation Needs: No Transportation Needs  . Lack of Transportation (Medical): No  . Lack of Transportation (Non-Medical): No  Physical Activity: Insufficiently Active  . Days of Exercise per Week: 7 days  . Minutes of Exercise per Session: 20 min  Stress: No Stress Concern Present  .  Feeling of Stress : Only a little  Social Connections: Moderately Isolated  . Frequency of Communication with Friends and Family: More than three times a week  . Frequency of Social Gatherings with Friends and Family: More than three times a week  . Attends Religious Services: Never  . Active Member of Clubs or Organizations: Yes  . Attends Archivist Meetings: 1 to 4 times per year  . Marital Status: Widowed     Allergies:   Allergies  Allergen Reactions  . Rutherford Nail [Apremilast] Anaphylaxis    Suicidal ideation  . Penicillins Anaphylaxis    Has patient had a PCN reaction causing immediate rash, facial/tongue/throat swelling, SOB or lightheadedness with hypotension: Yes Has patient had a PCN reaction causing severe rash involving mucus membranes or skin necrosis: Yes Has patient had a PCN reaction that required hospitalization: No Has patient had a PCN reaction occurring within the last 10 year No If all of the above answers are "NO", then may proceed with Cephalosporin use.   . Erythromycin Other (See Comments)    SEVERE STOMACH CRAMPS  . Morphine And Related Nausea And Vomiting  . Nsaids Other (See Comments)    SEVERE STOMACH CRAMPS, MOUTH SORES **Able to tolerate Tylenol  . Remicade [Infliximab] Other (See Comments)    Shut down immune system-BP high  . Tolmetin     SEVERE STOMACH CRAMPS  . Nickel Rash     Including snaps on hospital gowns     Metabolic Disorder Labs: Lab Results  Component Value Date   HGBA1C 5.3 11/05/2016   No results found for: PROLACTIN Lab Results  Component Value Date   CHOL 177 04/23/2020   TRIG 76.0 04/23/2020   HDL 69.40 04/23/2020   CHOLHDL 3 04/23/2020   VLDL 15.2 04/23/2020   LDLCALC 92 04/23/2020   LDLCALC 100 (H) 02/03/2019     Current Medications: Current Outpatient Medications  Medication Sig Dispense Refill  . atorvastatin (LIPITOR) 20 MG tablet Take 1 tablet (20 mg total) by mouth daily at 6 PM. 90 tablet 3  . betamethasone dipropionate 0.05 % lotion APPLY EXTERNALLY TO THE AFFECTED AREA DAILY 60 mL 2  . Calcium-Magnesium-Zinc (CAL-MAG-ZINC PO) Take 1 tablet by mouth every evening.    . Cholecalciferol (VITAMIN D3) 125 MCG (5000 UT) TABS Take 5,000 Units by mouth every evening.    . clindamycin (CLEOCIN) 300 MG capsule clindamycin HCl 300 mg capsule  TAKE 3 CAPSULES BY MOUTH 1 HOUR BEFORE DENTAL APPOINTMENT    . escitalopram (LEXAPRO) 10 MG tablet Take 1.5 tablets (15 mg total) by mouth daily. 135 tablet 0  . famotidine (PEPCID) 20 MG tablet Take 1 tablet (20 mg total) by mouth 2 (two) times daily. MUST HAVE OFFICE VISIT FOR FURTHER REFILLS (Patient not taking: No sig reported) 60 tablet 0  . FIBER PO Take 1 capsule by mouth in the morning and at bedtime.    . gabapentin (NEURONTIN) 300 MG capsule Take 1 capsule (300 mg total) by mouth 2 (two) times daily. 90 capsule 11  . HYDROcodone-acetaminophen (NORCO/VICODIN) 5-325 MG tablet Take 1 tablet by mouth every 6 (six) hours as needed.    . hydrocortisone cream 1 % Apply 1 application topically daily as needed for itching.    Marland Kitchen L-Methylfolate-B12-B6-B2 (CEREFOLIN PO) Take 1 tablet by mouth daily.    Marland Kitchen losartan (COZAAR) 50 MG tablet TAKE 1 TABLET(50 MG) BY MOUTH DAILY (Patient taking differently: Take 75 mg by mouth daily. TAKE 1 TABLET(50 MG) BY  MOUTH DAILY) 90 tablet 3  .  sulfamethoxazole-trimethoprim (BACTRIM,SEPTRA) 400-80 MG tablet Take 1 tablet by mouth daily. For urethritis.    . SUMAtriptan (IMITREX) 100 MG tablet TAKE 1 TABLET BY MOUTH AS NEEDED FOR MIGRAINE. MAY REPEAT DOSE IN 2 HOURS IF HEADACHE PERSISTS OR RECURS 10 tablet 0  . traZODone (DESYREL) 50 MG tablet TAKE 1 TABLET(50 MG) BY MOUTH AT BEDTIME 90 tablet 1   No current facility-administered medications for this visit.     Psychiatric Specialty Exam: Review of Systems  Cardiovascular: Negative for chest pain.  Psychiatric/Behavioral: Negative for suicidal ideas.    There were no vitals taken for this visit.There is no height or weight on file to calculate BMI.  General Appearance:   Eye Contact:    Speech:  Normal Rate  Volume:  Decreased  Mood: fair  Affect:  Congruent  Thought Process:  Goal Directed  Orientation:  Full (Time, Place, and Person)  Thought Content:  Rumination  Suicidal Thoughts:  No  Homicidal Thoughts:  No  Memory:  Immediate;   Fair Recent;   Fair  Judgement:  Fair  Insight:  Fair  Psychomotor Activity:  Normal  Concentration:  Concentration: Fair and Attention Span: Fair  Recall:  [employer] of Knowledge:Good  Language: Good  Akathisia:  No  Handed:  Right  AIMS (if indicated):    Assets:  Desire for Improvement  ADL's:  Intact  Cognition: Impaired,  Mild  Sleep:  Variable to fair on meds    Treatment Plan Summary: Medication management and Plan as follows  1. MDD, mild to moderate: fair but gets subdued when alone, wants to increase lexapro will do 15mg   Alcohol dependence: sustained remission and active in [place], MD 3/10/202210:12 AM

## 2020-05-03 ENCOUNTER — Encounter: Payer: Self-pay | Admitting: Adult Health

## 2020-05-03 ENCOUNTER — Other Ambulatory Visit: Payer: Self-pay | Admitting: Adult Health

## 2020-05-03 DIAGNOSIS — I10 Essential (primary) hypertension: Secondary | ICD-10-CM

## 2020-05-03 MED ORDER — LOSARTAN POTASSIUM 100 MG PO TABS
ORAL_TABLET | ORAL | 3 refills | Status: DC
Start: 2020-05-03 — End: 2020-07-04

## 2020-05-06 ENCOUNTER — Telehealth: Payer: Self-pay | Admitting: Pharmacist

## 2020-05-06 NOTE — Chronic Care Management (AMB) (Signed)
    Chronic Care Management Pharmacy Assistant   Name: Tonya Bartlett  MRN: [id number] DOB: Mar 24, 1945  Reason for Encounter: General Adherence Call       I attempted to reach the patient on several occasions for her general adherence call. I left several messages with no return phone call.    Maia Breslow, [place] Assistant [phone]

## 2020-05-19 ENCOUNTER — Other Ambulatory Visit: Payer: Self-pay | Admitting: Adult Health

## 2020-05-19 DIAGNOSIS — G4700 Insomnia, unspecified: Secondary | ICD-10-CM

## 2020-05-22 DIAGNOSIS — Z6833 Body mass index (BMI) 33.0-33.9, adult: Secondary | ICD-10-CM | POA: Diagnosis not present

## 2020-05-22 DIAGNOSIS — Z01419 Encounter for gynecological examination (general) (routine) without abnormal findings: Secondary | ICD-10-CM | POA: Diagnosis not present

## 2020-05-24 ENCOUNTER — Other Ambulatory Visit: Payer: Self-pay | Admitting: Adult Health

## 2020-05-24 ENCOUNTER — Ambulatory Visit
Admission: RE | Admit: 2020-05-24 | Discharge: 2020-05-24 | Disposition: A | Discharge status: Home / Self Care | Payer: Medicare Other | Source: Ambulatory Visit | Attending: Adult Health | Admitting: Adult Health

## 2020-05-24 ENCOUNTER — Other Ambulatory Visit: Payer: Self-pay

## 2020-05-24 DIAGNOSIS — Z1231 Encounter for screening mammogram for malignant neoplasm of breast: Secondary | ICD-10-CM

## 2020-05-28 ENCOUNTER — Other Ambulatory Visit: Payer: Self-pay | Admitting: Adult Health

## 2020-05-28 DIAGNOSIS — R928 Other abnormal and inconclusive findings on diagnostic imaging of breast: Secondary | ICD-10-CM

## 2020-05-31 ENCOUNTER — Other Ambulatory Visit: Payer: Self-pay | Admitting: Adult Health

## 2020-05-31 ENCOUNTER — Ambulatory Visit
Admission: RE | Admit: 2020-05-31 | Discharge: 2020-05-31 | Disposition: A | Discharge status: Home / Self Care | Payer: Medicare Other | Source: Ambulatory Visit | Attending: Adult Health | Admitting: Adult Health

## 2020-05-31 ENCOUNTER — Other Ambulatory Visit: Payer: Self-pay

## 2020-05-31 DIAGNOSIS — R921 Mammographic calcification found on diagnostic imaging of breast: Secondary | ICD-10-CM

## 2020-05-31 DIAGNOSIS — R928 Other abnormal and inconclusive findings on diagnostic imaging of breast: Secondary | ICD-10-CM

## 2020-06-03 DIAGNOSIS — H35362 Drusen (degenerative) of macula, left eye: Secondary | ICD-10-CM | POA: Diagnosis not present

## 2020-06-03 DIAGNOSIS — H5212 Myopia, left eye: Secondary | ICD-10-CM | POA: Diagnosis not present

## 2020-06-03 DIAGNOSIS — H5201 Hypermetropia, right eye: Secondary | ICD-10-CM | POA: Diagnosis not present

## 2020-06-03 DIAGNOSIS — Z961 Presence of intraocular lens: Secondary | ICD-10-CM | POA: Diagnosis not present

## 2020-06-03 DIAGNOSIS — H524 Presbyopia: Secondary | ICD-10-CM | POA: Diagnosis not present

## 2020-06-03 DIAGNOSIS — H52223 Regular astigmatism, bilateral: Secondary | ICD-10-CM | POA: Diagnosis not present

## 2020-06-03 DIAGNOSIS — Z9849 Cataract extraction status, unspecified eye: Secondary | ICD-10-CM | POA: Diagnosis not present

## 2020-06-03 DIAGNOSIS — H3540 Unspecified peripheral retinal degeneration: Secondary | ICD-10-CM | POA: Diagnosis not present

## 2020-06-26 ENCOUNTER — Other Ambulatory Visit: Payer: Self-pay | Admitting: Adult Health

## 2020-06-26 DIAGNOSIS — H5201 Hypermetropia, right eye: Secondary | ICD-10-CM | POA: Diagnosis not present

## 2020-06-26 DIAGNOSIS — H5712 Ocular pain, left eye: Secondary | ICD-10-CM | POA: Diagnosis not present

## 2020-06-26 DIAGNOSIS — H52223 Regular astigmatism, bilateral: Secondary | ICD-10-CM | POA: Diagnosis not present

## 2020-06-26 DIAGNOSIS — T1512XA Foreign body in conjunctival sac, left eye, initial encounter: Secondary | ICD-10-CM | POA: Diagnosis not present

## 2020-06-26 DIAGNOSIS — H5212 Myopia, left eye: Secondary | ICD-10-CM | POA: Diagnosis not present

## 2020-06-26 DIAGNOSIS — Z760 Encounter for issue of repeat prescription: Secondary | ICD-10-CM

## 2020-07-04 ENCOUNTER — Encounter: Payer: Self-pay | Admitting: Gastroenterology

## 2020-07-04 ENCOUNTER — Ambulatory Visit (INDEPENDENT_AMBULATORY_CARE_PROVIDER_SITE_OTHER): Payer: Medicare Other | Admitting: Gastroenterology

## 2020-07-04 VITALS — BP 124/72 | HR 65 | Ht 65.0 in | Wt 219.0 lb

## 2020-07-04 DIAGNOSIS — K219 Gastro-esophageal reflux disease without esophagitis: Secondary | ICD-10-CM | POA: Diagnosis not present

## 2020-07-04 DIAGNOSIS — R1310 Dysphagia, unspecified: Secondary | ICD-10-CM

## 2020-07-04 DIAGNOSIS — Z86010 Personal history of colonic polyps: Secondary | ICD-10-CM | POA: Diagnosis not present

## 2020-07-04 MED ORDER — FAMOTIDINE 20 MG PO TABS: 20.0000 mg | ORAL_TABLET | Freq: Every day | ORAL | 3 refills | Status: DC | PRN

## 2020-07-04 NOTE — Patient Instructions (Addendum)
If you are age 76 or older, your body mass index should be between 23-30. Your Body mass index is 36.44 kg/m. If this is out of the aforementioned range listed, please consider follow up with your Primary Care Provider.  If you are age 63 or younger, your body mass index should be between 19-25. Your Body mass index is 36.44 kg/m. If this is out of the aformentioned range listed, please consider follow up with your Primary Care Provider.   You have been scheduled for an endoscopy. Please follow written instructions given to you at your visit today. If you use inhalers (even only as needed), please bring them with you on the day of your procedure.   We have sent the following medications to your pharmacy for you to pick up at your convenience: Pepcid 20 mg: Take daily as needed  Thank you for entrusting me with your care and for choosing [facility], Dr. Donnellson Cellar

## 2020-07-04 NOTE — Progress Notes (Signed)
HPI :  75 y/o female here for a follow up visit for dysphagia and GERD.  I have followed her in the past for surveillance colonoscopy for numerous colonic polyps.  She has had a history of suspected ischemic versus infectious colitis in 2017.  She has had issues with both fecal and urine incontinence for which she is required pelvic floor PT.  She is also had chronic reflux and dysphagia in the past.  Previously she had been on Prilosec 40 mg for reflux symptoms.  She has a history of osteoporosis we had discussed long-term risks and benefits.  She was able to taper down and eventually come off the Prilosec and was managing her reflux with Pepcid.  She states she ran out of this and currently not taking anything for her reflux.  She had previously lost significant amount of weight with dieting which helped her reflux.  She has not really had much problems with heartburn on a routine basis.  She has had problems with dysphagia that have recurred over the past few months.  She has dysphagia to solids that can be noted in her throat area and mid chest.  She will have this happen about every other day and is bothersome to her.  I performed an endoscopy for her in 2017 which showed a 4 cm hiatal hernia and no Barrett's esophagus.  In light of her dysphagia I performed empiric dilation and she states this resolved for her.  She states she has not had much dysphagia at all since that time and felt that really helped her.  She denies any cardiopulmonary symptoms, otherwise is feeling well.  Bowel habits are stable.  She had a colonoscopy with me in January 2020 at which time she had 5 polyps removed that were a mix of adenomatous and sessile serrated, due for another colonoscopy in January 2023. She had 10 adenomatous/sessile serrated polyps in 2018.  Procedure history: Colonoscopy 04/15/2016 - 10 small polyps- adenomatous / sessile serrated, diverticulosis, hemorrhoids, no colitis EGD on 09/26/2015 - 4cm  hiatal hernia, otherwise normal esophagus, empirically dilated to 94mm [place]. Mild gastritis noted  Colonoscopy 03/15/18 - The perianal and digital rectal examinations were normal. - A 4 to 5 mm polyp was found in the ascending colon. The polyp was flat. The polyp was removed with a cold snare. Resection and retrieval were complete. - Two sessile polyps were found in the transverse colon. The polyps were 3 to 4 mm in size. These polyps were removed with a cold snare. Resection and retrieval were complete. - A 3 mm polyp was found in the recto-sigmoid colon. The polyp was sessile. The polyp was removed with a cold snare. Resection and retrieval were complete. - A 3 mm polyp was found in the rectum. The polyp was sessile. The polyp was removed with a cold snare. Resection and retrieval were complete. - Multiple small-mouthed diverticula were found in the sigmoid colon, transverse colon, ascending colon and cecum, with tortousity in the sigmoid colon. - The exam was otherwise without abnormality. Retroflexed views of rectum not obtained due to small size of the rectum.  Some polyps adenomatous / sessile serrated - repeat in 3 years    Past Medical History:  Diagnosis Date  . Allergic rhinitis   . Colitis   . Complication of anesthesia    vomit x1 while on Morphine per pt  . Depression   . Diverticulosis   . Frequency of urination   . GERD (gastroesophageal reflux  disease)   . History of colon polyps   . 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 History of hiatal hernia   . History of iron deficiency anemia 1996   iron infusion  . History of migraine headaches   . History of rib fracture   . Hyperlipidemia   . IBS (irritable bowel syndrome)   . Insomnia   . MCI (mild cognitive impairment)   . Melanoma ([place]) 2019   left leg   . Migraine headache    hx of migraines   . OA (osteoarthritis)    RIGHT SHOULDER  . Pneumonia    remote history  . PONV  (postoperative nausea and vomiting)   . Recovering alcoholic ([place])    SINCE [id number]  . Right rotator cuff tear   . Unspecified essential hypertension      Past Surgical History:  Procedure Laterality Date  . BUNIONECTOMY/  HAMMERTOE CORRECTION  RIGHT FOOT  2011  . CATARACT EXTRACTION W/ INTRAOCULAR LENS  IMPLANT, BILATERAL    . COLONOSCOPY    . KNEE ARTHROSCOPY W/ MENISCECTOMY Bilateral X2  LEFT /    X1  RIGHT  . KNEE OPEN LATERAL RELEASE Bilateral   . MOHS SURGERY Left 12/15/2017   [facility]oma in situ - [facility]  . REPLACEMENT TOTAL KNEE Left 2006  . SHOULDER ARTHROSCOPY WITH SUBACROMIAL DECOMPRESSION, ROTATOR CUFF REPAIR AND BICEP TENDON REPAIR Right 05/23/2013   Procedure: RIGHT SHOULDER ARTHROSCOPY EXAM UNDER ANESTHESIA  WITH SUBACROMIAL DECOMPRESSION,DISTAL CLAVICLE RESECTION, SADLABRAL DEBRIDEMENT CHONDROPLASTY, BICEP TENOTOMY ;  Surgeon: Sydnee Cabal, MD;  Location: [facility];  Service: Orthopedics;  Laterality: Right;  . TONSILLECTOMY AND ADENOIDECTOMY  AGE 61  . TOTAL HIP ARTHROPLASTY Left 05/04/2017   Procedure: LEFT TOTAL HIP ARTHROPLASTY ANTERIOR APPROACH;  Surgeon: Paralee Cancel, MD;  [facility];  Service: Orthopedics;  Laterality: Left;  . TOTAL HIP ARTHROPLASTY Right 08/01/2019   Procedure: TOTAL HIP ARTHROPLASTY ANTERIOR APPROACH;  Surgeon: Paralee Cancel, MD;  [facility];  Service: Orthopedics;  Laterality: Right;  98mins  . TOTAL HIP ARTHROPLASTY Right 08/01/2019  . TOTAL KNEE ARTHROPLASTY Bilateral LEFT  1996/   RIGHT 2004  . 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 UPPER GI ENDOSCOPY    . VAGINAL HYSTERECTOMY  1976   Family History  Problem Relation Age of Onset  . Heart disease Father 24  . Hypertension Father   . Melanoma Mother   . Cancer Sister        skin CA, 2 sisters  . Esophageal cancer Brother   . Dementia Paternal Grandfather   . Colon cancer Neg Hx   . Colon polyps Neg Hx   . Stomach cancer Neg Hx    . Rectal cancer Neg Hx    Social History   Tobacco Use  . Smoking status: Former Smoker    Packs/day: 0.50    Years: 15.00    Pack years: 7.50    Types: Cigarettes    Quit date: 05/15/1985    Years since quitting: 35.1  . Smokeless tobacco: Never Used  Vaping Use  . Vaping Use: Never used  Substance Use Topics  . Alcohol use: No    Comment: RECOVERING ALCOHOLIC--  QUIT [id number]  . Drug use: No   Current Outpatient Medications  Medication Sig Dispense Refill  . atorvastatin (LIPITOR) 20 MG tablet Take 1 tablet (20 mg total) by  mouth daily at 6 PM. 90 tablet 3  . betamethasone dipropionate 0.05 % lotion APPLY EXTERNALLY TO THE AFFECTED AREA DAILY 60 mL 2  . Calcium-Magnesium-Zinc (CAL-MAG-ZINC PO) Take 1 tablet by mouth every evening.    . Cholecalciferol (VITAMIN D3) 125 MCG (5000 UT) TABS Take 5,000 Units by mouth every evening.    . clindamycin (CLEOCIN) 300 MG capsule clindamycin HCl 300 mg capsule  TAKE 3 CAPSULES BY MOUTH 1 HOUR BEFORE DENTAL APPOINTMENT    . escitalopram (LEXAPRO) 10 MG tablet Take 1.5 tablets (15 mg total) by mouth daily. 135 tablet 0  . FIBER PO Take 1 capsule by mouth in the morning and at bedtime.    Marland Kitchen HYDROcodone-acetaminophen (NORCO/VICODIN) 5-325 MG tablet Take 1 tablet by mouth every 6 (six) hours as needed.    . hydrocortisone cream 1 % Apply 1 application topically daily as needed for itching.    Marland Kitchen L-Methylfolate-B12-B6-B2 (CEREFOLIN PO) Take 1 tablet by mouth daily.    Marland Kitchen losartan (COZAAR) 50 MG tablet Take 1 tablet by mouth daily.    Marland Kitchen sulfamethoxazole-trimethoprim (BACTRIM,SEPTRA) 400-80 MG tablet Take 1 tablet by mouth daily. For urethritis.    . SUMAtriptan (IMITREX) 100 MG tablet TAKE 1 TABLET BY MOUTH AS NEEDED FOR MIGRAINE. MAY REPEAT DOSE IN 2 HOURS IF HEADACHE PERSISTS OR RECURS 10 tablet 0  . traZODone (DESYREL) 50 MG tablet TAKE 1 TABLET(50 MG) BY MOUTH AT BEDTIME 90 tablet 1   No current facility-administered medications for this  visit.   Allergies  Allergen Reactions  . Ruther[place] Nail [Apremilast] Anaphylaxis    Suicidal ideation  . Penicillins Anaphylaxis    Has patient had a PCN reaction causing immediate rash, facial/tongue/throat swelling, SOB or lightheadedness with hypotension: Yes Has patient had a PCN reaction causing severe rash involving mucus membranes or skin necrosis: Yes Has patient had a PCN reaction that required hospitalization: No Has patient had a PCN reaction occurring within the last 10 year No If all of the above answers are "NO", then may proceed with Cephalosporin use.   . Erythromycin Other (See Comments)    SEVERE STOMACH CRAMPS  . Morphine And Related Nausea And Vomiting  . Nsaids Other (See Comments)    SEVERE STOMACH CRAMPS, MOUTH SORES **Able to tolerate Tylenol  . Remicade [Infliximab] Other (See Comments)    Shut down immune system-BP high  . Tolmetin     SEVERE STOMACH CRAMPS  . Nickel Rash    Including snaps on hospital gowns      Review of Systems: All systems reviewed and negative except where noted in HPI.   Lab Results  Component Value Date   WBC 5.5 04/23/2020   HGB 12.8 04/23/2020   HCT 37.9 04/23/2020   MCV 98.2 04/23/2020   PLT 276.0 04/23/2020    Lab Results  Component Value Date   CREATININE 0.59 04/23/2020   BUN 9 04/23/2020   NA 139 04/23/2020   K 4.2 04/23/2020   CL 102 04/23/2020   CO2 28 04/23/2020    Lab Results  Component Value Date   ALT 23 04/23/2020   AST 22 04/23/2020   ALKPHOS 69 04/23/2020   BILITOT 1.0 04/23/2020     Physical Exam: BP 124/72 (BP Location: Left Arm, Patient Position: Sitting, Cuff Size: Large)   Pulse 65   Ht 5\' 5"  (1.651 m)   Wt 219 lb (99.3 kg)   SpO2 95%   BMI 36.44 kg/m  Constitutional: Pleasant,well-developed, female in no acute distress.  Neurological: Alert and oriented to person place and time. Psychiatric: Normal mood and affect. Behavior is normal.   ASSESSMENT AND PLAN: 75 year old female here  for reassessment of following:  Dysphagia GERD History of colon polyps  Her reflux has been relatively well controlled after she has lost some weight and she was able to taper off PPI and use Pepcid as needed.  She actually has not needed much of that at all recently and generally doing okay in regards to reflux symptoms.  She has had recurrence of dysphagia in recent months.  As above she had an EGD a few years ago in which.  Dilation was performed and she states she had significant benefit with that.  She is asking for another dilation today.  We discussed differential diagnosis for dysphagia with her, given her benefit with empiric dilation in the past I offered her that again.  We discussed risk benefits of EGD and anesthesia and she wants to proceed.  In the interim it may be reasonable to take Pepcid once daily until we do the endoscopy in case she has some subtle reflux that could be associated with this, she was agreeable with the plan.  Otherwise due for surveillance colonoscopy next year.  She is agreeable to surveillance colonoscopy, if no high risk lesions that may be her last colonoscopy given age.  She agreed  Ontario Cellar, MD [facility]

## 2020-07-08 DIAGNOSIS — E669 Obesity, unspecified: Secondary | ICD-10-CM | POA: Diagnosis not present

## 2020-07-08 DIAGNOSIS — G5622 Lesion of ulnar nerve, left upper limb: Secondary | ICD-10-CM | POA: Diagnosis not present

## 2020-07-08 DIAGNOSIS — M5136 Other intervertebral disc degeneration, lumbar region: Secondary | ICD-10-CM | POA: Diagnosis not present

## 2020-07-08 DIAGNOSIS — M25511 Pain in right shoulder: Secondary | ICD-10-CM | POA: Diagnosis not present

## 2020-07-08 DIAGNOSIS — L401 Generalized pustular psoriasis: Secondary | ICD-10-CM | POA: Diagnosis not present

## 2020-07-08 DIAGNOSIS — Z6832 Body mass index (BMI) 32.0-32.9, adult: Secondary | ICD-10-CM | POA: Diagnosis not present

## 2020-07-08 DIAGNOSIS — M150 Primary generalized (osteo)arthritis: Secondary | ICD-10-CM | POA: Diagnosis not present

## 2020-07-08 DIAGNOSIS — M5030 Other cervical disc degeneration, unspecified cervical region: Secondary | ICD-10-CM | POA: Diagnosis not present

## 2020-07-08 DIAGNOSIS — L4050 Arthropathic psoriasis, unspecified: Secondary | ICD-10-CM | POA: Diagnosis not present

## 2020-07-22 ENCOUNTER — Other Ambulatory Visit: Payer: Self-pay

## 2020-07-23 ENCOUNTER — Encounter: Payer: Self-pay | Admitting: Adult Health

## 2020-07-23 ENCOUNTER — Ambulatory Visit (INDEPENDENT_AMBULATORY_CARE_PROVIDER_SITE_OTHER): Payer: Medicare Other | Admitting: Adult Health

## 2020-07-23 VITALS — BP 160/80 | HR 81 | Temp 98.1°F | Ht 65.0 in | Wt 218.0 lb

## 2020-07-23 DIAGNOSIS — I10 Essential (primary) hypertension: Secondary | ICD-10-CM

## 2020-07-23 DIAGNOSIS — R296 Repeated falls: Secondary | ICD-10-CM | POA: Diagnosis not present

## 2020-07-23 NOTE — Progress Notes (Signed)
Subjective:    Patient ID: Tonya Bartlett, female    DOB: 1945/07/13, 75 y.o.   MRN: [id number]  HPI 75 year old female who  has a past medical history of Allergic rhinitis, Colitis, Complication of anesthesia, Depression, Diverticulosis, Frequency of urination, GERD (gastroesophageal reflux disease), History of colon polyps, History of DVT of lower extremity, History of hiatal hernia, History of iron deficiency anemia (1996), History of migraine headaches, History of rib fracture, Hyperlipidemia, IBS (irritable bowel syndrome), Insomnia, MCI (mild cognitive impairment), Melanoma ([place]) (2019), Migraine headache, OA (osteoarthritis), Pneumonia, PONV (postoperative nausea and vomiting), Recovering alcoholic ([place]), Right rotator cuff tear, and Unspecified essential hypertension.  She presents to the office today for frequent falls. She reports 4 falls in the last month. Multiple other falls prior that. She reports that yesterday she felt slightly dizzy but this resolved quickly resolved when she sat down. She did not fall yesterday   Prior to that she reports " I just feel off balance and I fall."  Reports that when she turns around she will feel more off balance.  Also feels as though she is drifting to one side when she walks and will have to grab onto something.  No dizziness, lightheadedness, chest pain, shortness of breath, or symptoms of orthostatic hypotension.  Thankfully falls have not caused serious injury but she does have bruises throughout her body.  She does report hitting her head during a couple of these falls. Not on blood thinner.   BP Readings from Last 3 Encounters:  07/23/20 (!) 160/80  07/04/20 124/72  04/23/20 (!) 146/92    Review of Systems See HPI   Past Medical History:  Diagnosis Date  . Allergic rhinitis   . Colitis   . Complication of anesthesia    vomit x1 while on Morphine per pt  . Depression   . Diverticulosis   . Frequency of urination   . GERD  (gastroesophageal reflux disease)   . History of colon polyps   . 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 History of hiatal hernia   . History of iron deficiency anemia 1996   iron infusion  . History of migraine headaches   . History of rib fracture   . Hyperlipidemia   . IBS (irritable bowel syndrome)   . Insomnia   . MCI (mild cognitive impairment)   . Melanoma ([place]) 2019   left leg   . Migraine headache    hx of migraines   . OA (osteoarthritis)    RIGHT SHOULDER  . Pneumonia    remote history  . PONV (postoperative nausea and vomiting)   . Recovering alcoholic ([place])    SINCE [id number]  . Right rotator cuff tear   . Unspecified essential hypertension     Social History   Socioeconomic History  . Marital status: Widowed    Spouse name: Not on file  . Number of children: Not on file  . Years of education: Not on file  . Highest education level: Master's degree (e.g., MA, MS, MEng, MEd, MSW, MBA)  Occupational History  . Not on file  Tobacco Use  . Smoking status: Former Smoker    Packs/day: 0.50    Years: 15.00    Pack years: 7.50    Types: Cigarettes    Quit date: 05/15/1985    Years since quitting: 35.2  . Smokeless tobacco: Never Used  Vaping Use  . Vaping Use:  Never used  Substance and Sexual Activity  . Alcohol use: No    Comment: RECOVERING ALCOHOLIC--  QUIT [id number]  . Drug use: No  . Sexual activity: Never    Birth control/protection: Post-menopausal  Other Topics Concern  . 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 Difficulty of Paying Living Expenses: Not hard at all  Food Insecurity: No Food Insecurity  . Worried About Charity fundraiser in the Last Year: Never true  . Ran Out of Food in the Last Year: Never  true  Transportation Needs: No Transportation Needs  . Lack of Transportation (Medical): No  . Lack of Transportation (Non-Medical): No  Physical Activity: Insufficiently Active  . Days of Exercise per Week: 7 days  . Minutes of Exercise per Session: 20 min  Stress: No Stress Concern Present  . Feeling of Stress : Only a little  Social Connections: Moderately Isolated  . Frequency of Communication with Friends and Family: More than three times a week  . Frequency of Social Gatherings with Friends and Family: More than three times a week  . Attends Religious Services: Never  . Active Member of Clubs or Organizations: Yes  . Attends Archivist Meetings: 1 to 4 times per year  . Marital Status: Widowed  Intimate Partner Violence: Not At Risk  . Fear of Current or Ex-Partner: No  . Emotionally Abused: No  . Physically Abused: No  . Sexually Abused: No    Past Surgical History:  Procedure Laterality Date  . BUNIONECTOMY/  HAMMERTOE CORRECTION  RIGHT FOOT  2011  . CATARACT EXTRACTION W/ INTRAOCULAR LENS  IMPLANT, BILATERAL    . COLONOSCOPY    . KNEE ARTHROSCOPY W/ MENISCECTOMY Bilateral X2  LEFT /    X1  RIGHT  . KNEE OPEN LATERAL RELEASE Bilateral   . MOHS SURGERY Left 12/15/2017   [facility]oma in situ - [facility]  . REPLACEMENT TOTAL KNEE Left 2006  . SHOULDER ARTHROSCOPY WITH SUBACROMIAL DECOMPRESSION, ROTATOR CUFF REPAIR AND BICEP TENDON REPAIR Right 05/23/2013   Procedure: RIGHT SHOULDER ARTHROSCOPY EXAM UNDER ANESTHESIA  WITH SUBACROMIAL DECOMPRESSION,DISTAL CLAVICLE RESECTION, SADLABRAL DEBRIDEMENT CHONDROPLASTY, BICEP TENOTOMY ;  Surgeon: Sydnee Cabal, MD;  Location: [facility];  Service: Orthopedics;  Laterality: Right;  . TONSILLECTOMY AND ADENOIDECTOMY  AGE 32  . TOTAL HIP ARTHROPLASTY Left 05/04/2017   Procedure: LEFT TOTAL HIP ARTHROPLASTY ANTERIOR APPROACH;  Surgeon: Paralee Cancel, MD;  Location: [facility];  Service: Orthopedics;   Laterality: Left;  . TOTAL HIP ARTHROPLASTY Right 08/01/2019   Procedure: TOTAL HIP ARTHROPLASTY ANTERIOR APPROACH;  Surgeon: Paralee Cancel, MD;  Location: [facility];  Service: Orthopedics;  Laterality: Right;  89mins  . TOTAL HIP ARTHROPLASTY Right 08/01/2019  . TOTAL KNEE ARTHROPLASTY Bilateral LEFT  1996/   RIGHT 2004  . 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 UPPER GI ENDOSCOPY    . VAGINAL HYSTERECTOMY  1976    Family History  Problem Relation Age of Onset  . Heart disease Father 58  . Hypertension Father   . Melanoma Mother   . Cancer Sister        skin CA, 2 sisters  . Esophageal cancer  Brother   . Dementia Paternal Grandfather   . Colon cancer Neg Hx   . Colon polyps Neg Hx   . Stomach cancer Neg Hx   . Rectal cancer Neg Hx     Allergies  Allergen Reactions  . Rutherford Nail [Apremilast] Anaphylaxis    Suicidal ideation  . Penicillins Anaphylaxis    Has patient had a PCN reaction causing immediate rash, facial/tongue/throat swelling, SOB or lightheadedness with hypotension: Yes Has patient had a PCN reaction causing severe rash involving mucus membranes or skin necrosis: Yes Has patient had a PCN reaction that required hospitalization: No Has patient had a PCN reaction occurring within the last 10 year No If all of the above answers are "NO", then may proceed with Cephalosporin use.   . Erythromycin Other (See Comments)    SEVERE STOMACH CRAMPS  . Morphine And Related Nausea And Vomiting  . Nsaids Other (See Comments)    SEVERE STOMACH CRAMPS, MOUTH SORES **Able to tolerate Tylenol  . Remicade [Infliximab] Other (See Comments)    Shut down immune system-BP high  . Tolmetin     SEVERE STOMACH CRAMPS  . Nickel Rash    Including snaps on hospital gowns     Current Outpatient Medications on File Prior to Visit  Medication Sig Dispense Refill  . atorvastatin (LIPITOR) 20 MG tablet Take 1 tablet (20 mg total) by mouth daily at 6 PM. 90 tablet 3  . betamethasone dipropionate  0.05 % lotion APPLY EXTERNALLY TO THE AFFECTED AREA DAILY 60 mL 2  . Calcium-Magnesium-Zinc (CAL-MAG-ZINC PO) Take 1 tablet by mouth every evening.    . Cholecalciferol (VITAMIN D3) 125 MCG (5000 UT) TABS Take 5,000 Units by mouth every evening.    . clindamycin (CLEOCIN) 300 MG capsule clindamycin HCl 300 mg capsule  TAKE 3 CAPSULES BY MOUTH 1 HOUR BEFORE DENTAL APPOINTMENT    . escitalopram (LEXAPRO) 10 MG tablet Take 1.5 tablets (15 mg total) by mouth daily. 135 tablet 0  . famotidine (PEPCID) 20 MG tablet Take 1 tablet (20 mg total) by mouth daily as needed for heartburn or indigestion. 90 tablet 3  . FIBER PO Take 1 capsule by mouth in the morning and at bedtime.    Marland Kitchen HYDROcodone-acetaminophen (NORCO/VICODIN) 5-325 MG tablet Take 1 tablet by mouth every 6 (six) hours as needed.    . hydrocortisone cream 1 % Apply 1 application topically daily as needed for itching.    Marland Kitchen L-Methylfolate-B12-B6-B2 (CEREFOLIN PO) Take 1 tablet by mouth daily.    Marland Kitchen losartan (COZAAR) 50 MG tablet Take 1 tablet by mouth daily.    Marland Kitchen sulfamethoxazole-trimethoprim (BACTRIM,SEPTRA) 400-80 MG tablet Take 1 tablet by mouth daily. For urethritis.    . SUMAtriptan (IMITREX) 100 MG tablet TAKE 1 TABLET BY MOUTH AS NEEDED FOR MIGRAINE. MAY REPEAT DOSE IN 2 HOURS IF HEADACHE PERSISTS OR RECURS 10 tablet 0  . traZODone (DESYREL) 50 MG tablet TAKE 1 TABLET(50 MG) BY MOUTH AT BEDTIME 90 tablet 1  . methotrexate (RHEUMATREX) 2.5 MG tablet Take 15 mg by mouth once a week.     No current facility-administered medications on file prior to visit.    BP (!) 160/80   Pulse 81   Temp 98.1 F (36.7 C) (Oral)   Ht 5\' 5"  (1.651 m)   Wt 218 lb (98.9 kg)   SpO2 96%   BMI 36.28 kg/m       Objective:   Physical Exam Vitals and nursing note reviewed.  Constitutional:  Appearance: Normal appearance.  HENT:     Right Ear: Tympanic membrane normal.     Left Ear: Tympanic membrane normal.     Nose: Nose normal.      Mouth/Throat:     Mouth: Mucous membranes are dry.  Eyes:     Extraocular Movements: Extraocular movements intact.     Pupils: Pupils are equal, round, and reactive to light.  Cardiovascular:     Rate and Rhythm: Normal rate and regular rhythm.     Pulses: Normal pulses.     Heart sounds: Normal heart sounds.  Pulmonary:     Effort: Pulmonary effort is normal.     Breath sounds: Normal breath sounds.  Skin:    General: Skin is warm and dry.     Capillary Refill: Capillary refill takes less than 2 seconds.     Findings: Bruising (multiple bruises on arms and legs) present.  Neurological:     General: No focal deficit present.     Mental Status: She is oriented to person, place, and time.     Cranial Nerves: No cranial nerve deficit.     Sensory: No sensory deficit.     Motor: No weakness.     Coordination: Coordination normal.     Gait: Gait normal.     Deep Tendon Reflexes: Reflexes normal.  Psychiatric:        Mood and Affect: Mood normal.        Behavior: Behavior normal.        Thought Content: Thought content normal.        Judgment: Judgment normal.       Assessment & Plan:  1. Frequent falls  - EKG 12-Lead-normal sinus rhythm with sinus arrhythmia.  Minimal voltage criteria for LVH, may be normal variant.  Rate 86-consistentwith previous EKGs - Basic Metabolic Panel; Future - CBC with Differential/Platelet; Future - Ambulatory referral to Physical Therapy - CBC with Differential/Platelet - Basic Metabolic Panel  2. Essential hypertension -Blood pressure elevated in the office today.  Is on losartan 50 mg.  We will have her monitor her blood pressure at home, if blood pressure continues to be elevated she will increase her dose to 75 mg.  We will follow-up with me in 2 to 3 weeks   [name], NP

## 2020-07-24 ENCOUNTER — Encounter (HOSPITAL_COMMUNITY): Payer: Self-pay | Admitting: Psychiatry

## 2020-07-24 ENCOUNTER — Telehealth (INDEPENDENT_AMBULATORY_CARE_PROVIDER_SITE_OTHER): Payer: Medicare Other | Admitting: Psychiatry

## 2020-07-24 DIAGNOSIS — F331 Major depressive disorder, recurrent, moderate: Secondary | ICD-10-CM

## 2020-07-24 LAB — CBC WITH DIFFERENTIAL/PLATELET
Basophils Absolute: 0.1 10*3/uL (ref 0.0–0.1)
Basophils Relative: 1.1 % (ref 0.0–3.0)
Eosinophils Absolute: 0.1 10*3/uL (ref 0.0–0.7)
Eosinophils Relative: 1.7 % (ref 0.0–5.0)
HCT: 38.4 % (ref 36.0–46.0)
Hemoglobin: 12.9 g/dL (ref 12.0–15.0)
Lymphocytes Relative: 20.6 % (ref 12.0–46.0)
Lymphs Abs: 1.2 10*3/uL (ref 0.7–4.0)
MCHC: 33.7 g/dL (ref 30.0–36.0)
MCV: 98.8 fl (ref 78.0–100.0)
Monocytes Absolute: 0.5 10*3/uL (ref 0.1–1.0)
Monocytes Relative: 7.7 % (ref 3.0–12.0)
Neutro Abs: 4.2 10*3/uL (ref 1.4–7.7)
Neutrophils Relative %: 68.9 % (ref 43.0–77.0)
Platelets: 289 10*3/uL (ref 150.0–400.0)
RBC: 3.88 Mil/uL (ref 3.87–5.11)
RDW: 12.8 % (ref 11.5–15.5)
WBC: 6 10*3/uL (ref 4.0–10.5)

## 2020-07-24 LAB — BASIC METABOLIC PANEL
BUN: 11 mg/dL (ref 6–23)
CO2: 28 mEq/L (ref 19–32)
Calcium: 9.4 mg/dL (ref 8.4–10.5)
Chloride: 103 mEq/L (ref 96–112)
Creatinine, Ser: 0.61 mg/dL (ref 0.40–1.20)
GFR: 87.17 mL/min (ref >60.00)
Glucose, Bld: 85 mg/dL (ref 70–99)
Potassium: 4 mEq/L (ref 3.5–5.1)
Sodium: 139 mEq/L (ref 135–145)

## 2020-07-24 MED ORDER — ESCITALOPRAM OXALATE 10 MG PO TABS: 15.0000 mg | ORAL_TABLET | Freq: Every day | ORAL | 0 refills | Status: DC

## 2020-07-24 NOTE — Progress Notes (Signed)
[facility] Outpatient visit Tele psych  Patient Identification: Tonya Bartlett MRN:  [id number] Date of Evaluation:  07/24/2020 Referral Source: NP Chief Complaint:   Depression follow up  Visit Diagnosis:    ICD-10-CM   1. Major depressive disorder, recurrent episode, moderate ([place])  F33.1    Virtual Visit via Telephone Note  I connected with Tonya Bartlett on 07/24/20 at 10:30 AM EDT by telephone and verified that I am speaking with the correct person using two identifiers.  Location: Patient: home Provider: home office   I discussed the limitations, risks, security and privacy concerns of performing an evaluation and management service by telephone and the availability of in person appointments. I also discussed with the patient that there may be a patient responsible charge related to this service. The patient expressed understanding and agreed to proceed.     I discussed the assessment and treatment plan with the patient. The patient was provided an opportunity to ask questions and all were answered. The patient agreed with the plan and demonstrated an understanding of the instructions.   The patient was advised to call back or seek an in-person evaluation if the symptoms worsen or if the condition fails to improve as anticipated.  I provided 11  minutes of non-face-to-face time during this encounter.   History of Present Illness: 75 years old currently widowed white female lives by herself has 3 grown kids and 6 grandkids referred initially by family medicine for management of depression   Remains sober 40 years plus and active in [place] Last visit increase Lexapro to 15 mg has helped She did meet her daughter during one of her graduation party overall she remains distant from the patient She loses some balance so she is getting checked up since she is post hip surgery and also may plan to do physical therapy   Modifying factors : Family  aggravating factors.  Hip replacement  surgery.  Daughter distant her since patient helped her son    Past Psychiatric History: depression  Previous Psychotropic Medications: Yes   Substance Abuse History in the last 12 months:  No.  Consequences of Substance Abuse: NA  Past Medical History:  Past Medical History:  Diagnosis Date  . Allergic rhinitis   . Colitis   . Complication of anesthesia    vomit x1 while on Morphine per pt  . Depression   . Diverticulosis   . Frequency of urination   . GERD (gastroesophageal reflux disease)   . History of colon polyps   . 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 History of hiatal hernia   . History of iron deficiency anemia 1996   iron infusion  . History of migraine headaches   . History of rib fracture   . Hyperlipidemia   . IBS (irritable bowel syndrome)   . Insomnia   . MCI (mild cognitive impairment)   . Melanoma ([place]) 2019   left leg   . Migraine headache    hx of migraines   . OA (osteoarthritis)    RIGHT SHOULDER  . Pneumonia    remote history  . PONV (postoperative nausea and vomiting)   . Recovering alcoholic ([place])    SINCE [id number]  . Right rotator cuff tear   . Unspecified essential hypertension     Past Surgical History:  Procedure Laterality Date  . BUNIONECTOMY/  HAMMERTOE CORRECTION  RIGHT FOOT  2011  . CATARACT EXTRACTION W/  INTRAOCULAR LENS  IMPLANT, BILATERAL    . COLONOSCOPY    . KNEE ARTHROSCOPY W/ MENISCECTOMY Bilateral X2  LEFT /    X1  RIGHT  . KNEE OPEN LATERAL RELEASE Bilateral   . MOHS SURGERY Left 12/15/2017   Melanoma in situ - [facility] calf - [facility]  . REPLACEMENT TOTAL KNEE Left 2006  . SHOULDER ARTHROSCOPY WITH SUBACROMIAL DECOMPRESSION, ROTATOR CUFF REPAIR AND BICEP TENDON REPAIR Right 05/23/2013   Procedure: RIGHT SHOULDER ARTHROSCOPY EXAM UNDER ANESTHESIA  WITH SUBACROMIAL DECOMPRESSION,DISTAL CLAVICLE RESECTION, SADLABRAL DEBRIDEMENT CHONDROPLASTY, BICEP TENOTOMY ;  Surgeon: Sydnee Cabal, MD;  Location: [place];  Service: Orthopedics;  Laterality: Right;  . TONSILLECTOMY AND ADENOIDECTOMY  AGE 1  . TOTAL HIP ARTHROPLASTY Left 05/04/2017   Procedure: LEFT TOTAL HIP ARTHROPLASTY ANTERIOR APPROACH;  Surgeon: Paralee Cancel, MD;  [facility];  Service: Orthopedics;  Laterality: Left;  . TOTAL HIP ARTHROPLASTY Right 08/01/2019   Procedure: TOTAL HIP ARTHROPLASTY ANTERIOR APPROACH;  Surgeon: Paralee Cancel, MD;  [facility];  Service: Orthopedics;  Laterality: Right;  29mins  . TOTAL HIP ARTHROPLASTY Right 08/01/2019  . TOTAL KNEE ARTHROPLASTY Bilateral LEFT  1996/   RIGHT 2004  . 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 UPPER GI ENDOSCOPY    . VAGINAL HYSTERECTOMY  1976    Family Psychiatric History: mom possible depression  Family History:  Family History  Problem Relation Age of Onset  . Heart disease Father 28  . Hypertension Father   . Melanoma Mother   . Cancer Sister        skin CA, 2 sisters  . Esophageal cancer Brother   . Dementia Paternal Grandfather   . Colon cancer Neg Hx   . Colon polyps Neg Hx   . Stomach cancer Neg Hx   . Rectal cancer Neg Hx     Social History:   Social History   Socioeconomic History  . Marital status: Widowed    Spouse name: Not on file  . Number of children: Not on file  . Years of education: Not on file  . Highest education level: Master's degree (e.g., MA, MS, MEng, MEd, MSW, MBA)  Occupational History  . Not on file  Tobacco Use  . Smoking status: Former Smoker    Packs/day: 0.50    Years: 15.00    Pack years: 7.50    Types: Cigarettes    Quit date: 05/15/1985    Years since quitting: 35.2  . Smokeless tobacco: Never Used  Vaping Use  . Vaping Use: Never used  Substance and Sexual Activity  . Alcohol use: No    Comment: RECOVERING ALCOHOLIC--  QUIT [id number]  . Drug use: No  . Sexual activity: Never    Birth control/protection: Post-menopausal  Other Topics Concern  . 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 Difficulty of Paying Living Expenses: Not hard at all  Food Insecurity: No Food Insecurity  . Worried About Charity fundraiser in the Last Year: Never true  . Ran Out of Food in the Last Year: Never true  Transportation Needs: No Transportation Needs  . Lack of Transportation (Medical): No  . Lack of  Transportation (Non-Medical): No  Physical Activity: Insufficiently Active  . Days of Exercise per Week: 7 days  . Minutes of Exercise per Session: 20 min  Stress: No Stress Concern Present  . Feeling of Stress : Only a little  Social Connections: Moderately Isolated  . Frequency of Communication with Friends and Family: More than three times a week  . Frequency of Social Gatherings with Friends and Family: More than three times a week  . Attends Religious Services: Never  . Active Member of Clubs or Organizations: Yes  . Attends Archivist Meetings: 1 to 4 times per year  . Marital Status: Widowed     Allergies:   Allergies  Allergen Reactions  . [place]uther[place] Nail [Apremilast] Anaphylaxis    Suicidal ideation  . Penicillins Anaphylaxis    Has patient had a PCN reaction causing immediate rash, facial/tongue/throat swelling, SOB or lightheadedness with hypotension: Yes Has patient had a PCN reaction causing severe rash involving mucus membranes or skin necrosis: Yes Has patient had a PCN reaction that required hospitalization: No Has patient had a PCN reaction occurring within the last 10 year No If all of the above answers are "NO", then may proceed with Cephalosporin use.   . Erythromycin Other (See Comments)    SEVERE STOMACH CRAMPS  . Morphine And Related Nausea And Vomiting  . Nsaids Other (See Comments)    SEVERE STOMACH CRAMPS, MOUTH  SORES **Able to tolerate Tylenol  . Remicade [Infliximab] Other (See Comments)    Shut down immune system-BP high  . Tolmetin     SEVERE STOMACH CRAMPS  . Nickel Rash    Including snaps on hospital gowns     Metabolic Disorder Labs: Lab Results  Component Value Date   HGBA1C 5.3 11/05/2016   No results found for: PROLACTIN Lab Results  Component Value Date   CHOL 177 04/23/2020   TRIG 76.0 04/23/2020   HDL 69.40 04/23/2020   CHOLHDL 3 04/23/2020   VLDL 15.2 04/23/2020   LDLCALC 92 04/23/2020   LDLCALC 100 (H) 02/03/2019     Current Medications: Current Outpatient Medications  Medication Sig Dispense Refill  . atorvastatin (LIPITOR) 20 MG tablet Take 1 tablet (20 mg total) by mouth daily at 6 PM. 90 tablet 3  . betamethasone dipropionate 0.05 % lotion APPLY EXTERNALLY TO THE AFFECTED AREA DAILY 60 mL 2  . Calcium-Magnesium-Zinc (CAL-MAG-ZINC PO) Take 1 tablet by mouth every evening.    . Cholecalciferol (VITAMIN D3) 125 MCG (5000 UT) TABS Take 5,000 Units by mouth every evening.    . clindamycin (CLEOCIN) 300 MG capsule clindamycin HCl 300 mg capsule  TAKE 3 CAPSULES BY MOUTH 1 HOUR BEFORE DENTAL APPOINTMENT    . escitalopram (LEXAPRO) 10 MG tablet Take 1.5 tablets (15 mg total) by mouth daily. 135 tablet 0  . famotidine (PEPCID) 20 MG tablet Take 1 tablet (20 mg total) by mouth daily as needed for heartburn or indigestion. 90 tablet 3  . FIBER PO Take 1 capsule by mouth in the morning and at bedtime.    Marland Kitchen HYDROcodone-acetaminophen (NORCO/VICODIN) 5-325 MG tablet Take 1 tablet by mouth every 6 (six) hours as needed.    . hydrocortisone cream 1 % Apply 1 application topically daily as needed for itching.    Marland Kitchen L-Methylfolate-B12-B6-B2 (CEREFOLIN PO) Take 1 tablet by mouth daily.    Marland Kitchen losartan (COZAAR) 50 MG tablet Take 1 tablet by mouth daily.    . methotrexate (RHEUMATREX) 2.5 MG tablet Take 15 mg by  mouth once a week.    . sulfamethoxazole-trimethoprim (BACTRIM,SEPTRA)  400-80 MG tablet Take 1 tablet by mouth daily. For urethritis.    . SUMAtriptan (IMITREX) 100 MG tablet TAKE 1 TABLET BY MOUTH AS NEEDED FOR MIGRAINE. MAY REPEAT DOSE IN 2 HOURS IF HEADACHE PERSISTS OR RECURS 10 tablet 0  . traZODone (DESYREL) 50 MG tablet TAKE 1 TABLET(50 MG) BY MOUTH AT BEDTIME 90 tablet 1   No current facility-administered medications for this visit.     Psychiatric Specialty Exam: Review of Systems  Cardiovascular: Negative for chest pain.  Psychiatric/Behavioral: Negative for suicidal ideas.    There were no vitals taken for this visit.There is no height or weight on file to calculate BMI.  General Appearance:   Eye Contact:    Speech:  Normal Rate  Volume:  Decreased  Mood: fair  Affect:  Congruent  Thought Process:  Goal Directed  Orientation:  Full (Time, Place, and Person)  Thought Content:  Rumination  Suicidal Thoughts:  No  Homicidal Thoughts:  No  Memory:  Immediate;   Fair Recent;   Fair  Judgement:  Fair  Insight:  Fair  Psychomotor Activity:  Normal  Concentration:  Concentration: Fair and Attention Span: Fair  Recall:  [employer] of Knowledge:Good  Language: Good  Akathisia:  No  Handed:  Right  AIMS (if indicated):    Assets:  Desire for Improvement  ADL's:  Intact  Cognition: Impaired,  Mild  Sleep:  Variable to fair on meds    Treatment Plan Summary: Medication management and Plan as follows  1. MDD, mild to moderate: Doing stable continue Lexapro 15 mg discussed to keep observation of her balance and follow-up with primary care physician be cautious about sudden movements    Alcohol dependence: sustained remission and active in AA  Follow-up in 4 months renewed medications  [name], MD 6/8/202210:38 AM

## 2020-07-31 ENCOUNTER — Inpatient Hospital Stay (HOSPITAL_COMMUNITY)
Admission: EM | Admit: 2020-07-31 | Discharge: 2020-08-06 | DRG: 494 | Disposition: A | Discharge status: Skilled Nursing Facility | Payer: Medicare Other | Attending: Internal Medicine | Admitting: Internal Medicine

## 2020-07-31 ENCOUNTER — Emergency Department (HOSPITAL_COMMUNITY): Payer: Medicare Other

## 2020-07-31 ENCOUNTER — Other Ambulatory Visit: Payer: Self-pay

## 2020-07-31 ENCOUNTER — Encounter (HOSPITAL_COMMUNITY): Payer: Self-pay | Admitting: *Deleted

## 2020-07-31 DIAGNOSIS — Z881 Allergy status to other antibiotic agents status: Secondary | ICD-10-CM

## 2020-07-31 DIAGNOSIS — S93431A Sprain of tibiofibular ligament of right ankle, initial encounter: Secondary | ICD-10-CM | POA: Diagnosis present

## 2020-07-31 DIAGNOSIS — Z888 Allergy status to other drugs, medicaments and biological substances status: Secondary | ICD-10-CM

## 2020-07-31 DIAGNOSIS — Z96653 Presence of artificial knee joint, bilateral: Secondary | ICD-10-CM | POA: Diagnosis present

## 2020-07-31 DIAGNOSIS — K219 Gastro-esophageal reflux disease without esophagitis: Secondary | ICD-10-CM | POA: Diagnosis present

## 2020-07-31 DIAGNOSIS — X501XXA Overexertion from prolonged static or awkward postures, initial encounter: Secondary | ICD-10-CM | POA: Diagnosis not present

## 2020-07-31 DIAGNOSIS — Z79899 Other long term (current) drug therapy: Secondary | ICD-10-CM

## 2020-07-31 DIAGNOSIS — Z86718 Personal history of other venous thrombosis and embolism: Secondary | ICD-10-CM | POA: Diagnosis not present

## 2020-07-31 DIAGNOSIS — Z91048 Other nonmedicinal substance allergy status: Secondary | ICD-10-CM

## 2020-07-31 DIAGNOSIS — Z9181 History of falling: Secondary | ICD-10-CM | POA: Diagnosis not present

## 2020-07-31 DIAGNOSIS — Z7401 Bed confinement status: Secondary | ICD-10-CM | POA: Diagnosis not present

## 2020-07-31 DIAGNOSIS — R41841 Cognitive communication deficit: Secondary | ICD-10-CM | POA: Diagnosis not present

## 2020-07-31 DIAGNOSIS — S0101XA Laceration without foreign body of scalp, initial encounter: Secondary | ICD-10-CM | POA: Diagnosis present

## 2020-07-31 DIAGNOSIS — S0990XA Unspecified injury of head, initial encounter: Secondary | ICD-10-CM | POA: Diagnosis not present

## 2020-07-31 DIAGNOSIS — E6609 Other obesity due to excess calories: Secondary | ICD-10-CM

## 2020-07-31 DIAGNOSIS — M7989 Other specified soft tissue disorders: Secondary | ICD-10-CM | POA: Diagnosis not present

## 2020-07-31 DIAGNOSIS — I10 Essential (primary) hypertension: Secondary | ICD-10-CM | POA: Diagnosis not present

## 2020-07-31 DIAGNOSIS — M1611 Unilateral primary osteoarthritis, right hip: Secondary | ICD-10-CM | POA: Diagnosis not present

## 2020-07-31 DIAGNOSIS — Z981 Arthrodesis status: Secondary | ICD-10-CM | POA: Diagnosis not present

## 2020-07-31 DIAGNOSIS — S82851D Displaced trimalleolar fracture of right lower leg, subsequent encounter for closed fracture with routine healing: Secondary | ICD-10-CM | POA: Diagnosis not present

## 2020-07-31 DIAGNOSIS — Z20822 Contact with and (suspected) exposure to covid-19: Secondary | ICD-10-CM | POA: Diagnosis not present

## 2020-07-31 DIAGNOSIS — W19XXXA Unspecified fall, initial encounter: Secondary | ICD-10-CM | POA: Diagnosis not present

## 2020-07-31 DIAGNOSIS — W010XXA Fall on same level from slipping, tripping and stumbling without subsequent striking against object, initial encounter: Secondary | ICD-10-CM | POA: Diagnosis present

## 2020-07-31 DIAGNOSIS — S199XXA Unspecified injury of neck, initial encounter: Secondary | ICD-10-CM | POA: Diagnosis not present

## 2020-07-31 DIAGNOSIS — Z23 Encounter for immunization: Secondary | ICD-10-CM | POA: Diagnosis not present

## 2020-07-31 DIAGNOSIS — E876 Hypokalemia: Secondary | ICD-10-CM | POA: Diagnosis present

## 2020-07-31 DIAGNOSIS — R531 Weakness: Secondary | ICD-10-CM | POA: Diagnosis not present

## 2020-07-31 DIAGNOSIS — Z6837 Body mass index (BMI) 37.0-37.9, adult: Secondary | ICD-10-CM | POA: Diagnosis not present

## 2020-07-31 DIAGNOSIS — Z8249 Family history of ischemic heart disease and other diseases of the circulatory system: Secondary | ICD-10-CM | POA: Diagnosis not present

## 2020-07-31 DIAGNOSIS — Z885 Allergy status to narcotic agent status: Secondary | ICD-10-CM | POA: Diagnosis not present

## 2020-07-31 DIAGNOSIS — I69828 Other speech and language deficits following other cerebrovascular disease: Secondary | ICD-10-CM | POA: Diagnosis not present

## 2020-07-31 DIAGNOSIS — Z886 Allergy status to analgesic agent status: Secondary | ICD-10-CM

## 2020-07-31 DIAGNOSIS — S82853A Displaced trimalleolar fracture of unspecified lower leg, initial encounter for closed fracture: Secondary | ICD-10-CM | POA: Diagnosis not present

## 2020-07-31 DIAGNOSIS — E785 Hyperlipidemia, unspecified: Secondary | ICD-10-CM | POA: Diagnosis not present

## 2020-07-31 DIAGNOSIS — R52 Pain, unspecified: Secondary | ICD-10-CM

## 2020-07-31 DIAGNOSIS — S82851A Displaced trimalleolar fracture of right lower leg, initial encounter for closed fracture: Secondary | ICD-10-CM | POA: Diagnosis not present

## 2020-07-31 DIAGNOSIS — E782 Mixed hyperlipidemia: Secondary | ICD-10-CM

## 2020-07-31 DIAGNOSIS — Z87891 Personal history of nicotine dependence: Secondary | ICD-10-CM

## 2020-07-31 DIAGNOSIS — Z96643 Presence of artificial hip joint, bilateral: Secondary | ICD-10-CM | POA: Diagnosis not present

## 2020-07-31 DIAGNOSIS — Z880 Allergy status to penicillin: Secondary | ICD-10-CM | POA: Diagnosis not present

## 2020-07-31 DIAGNOSIS — M47812 Spondylosis without myelopathy or radiculopathy, cervical region: Secondary | ICD-10-CM | POA: Diagnosis not present

## 2020-07-31 DIAGNOSIS — F32A Depression, unspecified: Secondary | ICD-10-CM | POA: Diagnosis present

## 2020-07-31 DIAGNOSIS — S8251XA Displaced fracture of medial malleolus of right tibia, initial encounter for closed fracture: Secondary | ICD-10-CM | POA: Diagnosis not present

## 2020-07-31 DIAGNOSIS — Z808 Family history of malignant neoplasm of other organs or systems: Secondary | ICD-10-CM

## 2020-07-31 DIAGNOSIS — S9304XA Dislocation of right ankle joint, initial encounter: Secondary | ICD-10-CM | POA: Diagnosis not present

## 2020-07-31 DIAGNOSIS — G8918 Other acute postprocedural pain: Secondary | ICD-10-CM | POA: Diagnosis not present

## 2020-07-31 DIAGNOSIS — S0191XD Laceration without foreign body of unspecified part of head, subsequent encounter: Secondary | ICD-10-CM | POA: Diagnosis not present

## 2020-07-31 DIAGNOSIS — D509 Iron deficiency anemia, unspecified: Secondary | ICD-10-CM | POA: Diagnosis not present

## 2020-07-31 DIAGNOSIS — Z86006 Personal history of melanoma in-situ: Secondary | ICD-10-CM

## 2020-07-31 DIAGNOSIS — E669 Obesity, unspecified: Secondary | ICD-10-CM | POA: Diagnosis present

## 2020-07-31 DIAGNOSIS — R2681 Unsteadiness on feet: Secondary | ICD-10-CM | POA: Diagnosis not present

## 2020-07-31 DIAGNOSIS — G319 Degenerative disease of nervous system, unspecified: Secondary | ICD-10-CM | POA: Diagnosis not present

## 2020-07-31 DIAGNOSIS — Z96649 Presence of unspecified artificial hip joint: Secondary | ICD-10-CM

## 2020-07-31 DIAGNOSIS — M6281 Muscle weakness (generalized): Secondary | ICD-10-CM | POA: Diagnosis not present

## 2020-07-31 DIAGNOSIS — R5381 Other malaise: Secondary | ICD-10-CM | POA: Diagnosis not present

## 2020-07-31 DIAGNOSIS — S8251XD Displaced fracture of medial malleolus of right tibia, subsequent encounter for closed fracture with routine healing: Secondary | ICD-10-CM | POA: Diagnosis not present

## 2020-07-31 LAB — COMPREHENSIVE METABOLIC PANEL
ALT: 24 U/L (ref 0–44)
AST: 23 U/L (ref 15–41)
Albumin: 3.8 g/dL (ref 3.5–5.0)
Alkaline Phosphatase: 60 U/L (ref 38–126)
Anion gap: 6 (ref 5–15)
BUN: 13 mg/dL (ref 8–23)
CO2: 25 mmol/L (ref 22–32)
Calcium: 9 mg/dL (ref 8.9–10.3)
Chloride: 107 mmol/L (ref 98–111)
Creatinine, Ser: 0.6 mg/dL (ref 0.44–1.00)
GFR, Estimated: >60 mL/min (ref >60)
Glucose, Bld: 109 mg/dL — ABNORMAL HIGH (ref 70–99)
Potassium: 3.5 mmol/L (ref 3.5–5.1)
Sodium: 138 mmol/L (ref 135–145)
Total Bilirubin: 0.6 mg/dL (ref 0.3–1.2)
Total Protein: 6.8 g/dL (ref 6.5–8.1)

## 2020-07-31 LAB — CBC WITH DIFFERENTIAL/PLATELET
Abs Immature Granulocytes: 0.02 10*3/uL (ref 0.00–0.07)
Basophils Absolute: 0.1 10*3/uL (ref 0.0–0.1)
Basophils Relative: 1 %
Eosinophils Absolute: 0.1 10*3/uL (ref 0.0–0.5)
Eosinophils Relative: 1 %
HCT: 35.6 % — ABNORMAL LOW (ref 36.0–46.0)
Hemoglobin: 12 g/dL (ref 12.0–15.0)
Immature Granulocytes: 0 %
Lymphocytes Relative: 15 %
Lymphs Abs: 1.3 10*3/uL (ref 0.7–4.0)
MCH: 33.4 pg (ref 26.0–34.0)
MCHC: 33.7 g/dL (ref 30.0–36.0)
MCV: 99.2 fL (ref 80.0–100.0)
Monocytes Absolute: 0.7 10*3/uL (ref 0.1–1.0)
Monocytes Relative: 9 %
Neutro Abs: 6.1 10*3/uL (ref 1.7–7.7)
Neutrophils Relative %: 74 %
Platelets: 242 10*3/uL (ref 150–400)
RBC: 3.59 MIL/uL — ABNORMAL LOW (ref 3.87–5.11)
RDW: 12.5 % (ref 11.5–15.5)
WBC: 8.2 10*3/uL (ref 4.0–10.5)
nRBC: 0 % (ref 0.0–0.2)

## 2020-07-31 LAB — RESP PANEL BY RT-PCR (FLU A&B, COVID) ARPGX2
Influenza A by PCR: NEGATIVE
Influenza B by PCR: NEGATIVE
SARS Coronavirus 2 by RT PCR: NEGATIVE

## 2020-07-31 MED ORDER — FENTANYL CITRATE (PF) 100 MCG/2ML IJ SOLN
50.0000 ug | Freq: Once | INTRAMUSCULAR | Status: AC
  Administered 2020-07-31: 50 ug via INTRAVENOUS
  Filled 2020-07-31: qty 2

## 2020-07-31 MED ORDER — CLONIDINE HCL 0.1 MG PO TABS
0.1000 mg | ORAL_TABLET | Freq: Once | ORAL | Status: AC
  Administered 2020-07-31: 0.1 mg via ORAL
  Filled 2020-07-31: qty 1

## 2020-07-31 MED ORDER — FENTANYL CITRATE (PF) 100 MCG/2ML IJ SOLN
25.0000 ug | Freq: Once | INTRAMUSCULAR | Status: AC
  Administered 2020-07-31: 25 ug via INTRAVENOUS
  Filled 2020-07-31: qty 2

## 2020-07-31 MED ORDER — PROPOFOL 10 MG/ML IV BOLUS
20.0000 mg | Freq: Once | INTRAVENOUS | Status: AC
  Administered 2020-07-31: 20 mg via INTRAVENOUS

## 2020-07-31 MED ORDER — FENTANYL CITRATE (PF) 100 MCG/2ML IJ SOLN
50.0000 ug | Freq: Once | INTRAMUSCULAR | Status: AC
Start: 2020-07-31 — End: 2020-07-31
  Administered 2020-07-31: 50 ug via INTRAVENOUS
  Filled 2020-07-31: qty 2

## 2020-07-31 MED ORDER — LORAZEPAM 2 MG/ML IJ SOLN
1.0000 mg | Freq: Once | INTRAMUSCULAR | Status: AC
  Administered 2020-07-31: 1 mg via INTRAVENOUS
  Filled 2020-07-31: qty 1

## 2020-07-31 MED ORDER — LIDOCAINE-EPINEPHRINE-TETRACAINE (LET) TOPICAL GEL
3.0000 mL | Freq: Once | TOPICAL | Status: AC
  Administered 2020-07-31: 3 mL via TOPICAL
  Filled 2020-07-31: qty 3

## 2020-07-31 MED ORDER — PROPOFOL 10 MG/ML IV BOLUS
0.5000 mg/kg | Freq: Once | INTRAVENOUS | Status: AC
  Administered 2020-07-31: 49.5 mg via INTRAVENOUS
  Filled 2020-07-31: qty 20

## 2020-07-31 MED ORDER — TETANUS-DIPHTH-ACELL PERTUSSIS 5-2.5-18.5 LF-MCG/0.5 IM SUSY
0.5000 mL | PREFILLED_SYRINGE | Freq: Once | INTRAMUSCULAR | Status: AC
  Administered 2020-07-31: 0.5 mL via INTRAMUSCULAR
  Filled 2020-07-31: qty 0.5

## 2020-07-31 NOTE — ED Notes (Signed)
Pt alert and orientedx4 after procedural sedation

## 2020-07-31 NOTE — ED Notes (Signed)
Secure chat needs to be sent to [facility], please.

## 2020-07-31 NOTE — Progress Notes (Signed)
Called by EDP for closed R trimall fx, patient of Dr. Alvan Dame. Has unergone closed reduction and splinting in ED. CT ankle done. EDP states patient to be admitted by [facility]. I have discussed the patient with Dr. Doran Durand, who plans for surgery on Sunday. NPO after MN on Saturday night. Splint, NWB for now. Please elevate toes above nose on blanket/pillows. Full consult to follow.

## 2020-07-31 NOTE — ED Notes (Signed)
Dr. Jonelle Sidle notified of pt pain and BP

## 2020-07-31 NOTE — ED Notes (Signed)
RT at bedside for procedural sedation. Pt placed on ETCO2 monitor. Consent signed for procedure

## 2020-07-31 NOTE — Progress Notes (Signed)
This writer was called to pt bedside for conscious sedation procedure.  Pt tolerated procedure well without incident.

## 2020-07-31 NOTE — ED Notes (Signed)
Ortho tech at bedside placing splint

## 2020-07-31 NOTE — ED Notes (Signed)
Propofol wasted with Lenoria Farrier RN

## 2020-07-31 NOTE — ED Notes (Signed)
Muscle relaxer requested for pt due to spasms. Awaiting orders

## 2020-07-31 NOTE — Progress Notes (Signed)
Orthopedic Tech Progress Note Patient Details:  Tonya Bartlett 15-Dec-1945 [id number]  Ortho Devices Type of Ortho Device: Ace wrap, Post (short leg) splint, Stirrup splint Ortho Device/Splint Location: reduction Ortho Device/Splint Interventions: Application   Post Interventions Patient Tolerated: Well Instructions Provided: Care of device  Tonya Bartlett 07/31/2020, 8:50 PM

## 2020-07-31 NOTE — ED Notes (Signed)
Time out performed on pt prior to procedural sedation

## 2020-07-31 NOTE — ED Notes (Signed)
V/o from Dr. Ashok Cordia to administer additional 20mg  of propofol. Pt is still awake

## 2020-07-31 NOTE — ED Triage Notes (Signed)
Pt BIB EMS from store- EMS reports pt had mechanical fall off curb, twisted ankle, fell backwards and hit posterior head.  Denies LOC, denies blood thinners.   Deformity noted to left LE, pedal pulses intact bilaterally, bleeding of head controlled on arrival.   PIV 20 g L hand -- 100 mcg fentanyl given en route.

## 2020-07-31 NOTE — H&P (Signed)
History and Physical   Tonya Bartlett:[id number] DOB: 26-Nov-1945 DOA: 07/31/2020  Referring MD/NP/PA: Dr. Lajean Saver  PCP: [name], NP   Outpatient Specialists: None  Patient coming from: Home  Chief Complaint: Fall with right ankle fracture  HPI: Tonya Bartlett is a 75 y.o. female with medical history significant of GERD, diverticular disease, depression, ADD, migraine headaches, irritable bowel syndrome, hyperlipidemia, essential hypertension, previous history of total hip replacement who presents after mechanical fall with twisting of her right ankle.  Patient is in severe pain 10 out of 10.  Denied any fever or chills.  Denied hitting her head.  Fall was entirely mechanical in nature.  She was seen in the ER and evaluated.  Patient found to have trimalleolar fracture of the right ankle.  Orthopedics is consulted and recommends medical admission.  Plan is to have surgery on Sunday.  Patient otherwise hemodynamically stable..  ED Course: Temperature 98.4 blood pressure 207/82 pulse 84 respirate 25 oxygen sat 93% room air.  White count 8.2 hemoglobin 12.1 platelets 142.  Chemistry entirely within normal.  COVID-19 screen is negative.  X-ray of the right ankle complete shows trimalleolar fracture with dislocation.  CT of the ankle shows the same thing.  Head CT without contrast negative CT cervical spine negative.  Patient being admitted for pain management and orthopedic surgery to follow.  Review of Systems: As per HPI otherwise 10 point review of systems negative.    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   History of hiatal hernia    History of iron deficiency anemia 1996   iron  infusion   History of migraine headaches    History of rib fracture    Hyperlipidemia    IBS (irritable bowel syndrome)    Insomnia    MCI (mild cognitive impairment)    Melanoma ([place]) 2019   left leg    Migraine headache    hx of migraines    OA (osteoarthritis)    RIGHT SHOULDER   Pneumonia    remote history   PONV (postoperative nausea and vomiting)    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   Right rotator cuff tear    Unspecified essential hypertension     Past Surgical History:  Procedure Laterality Date   BUNIONECTOMY/  HAMMERTOE CORRECTION  RIGHT FOOT  2011   CATARACT EXTRACTION W/ INTRAOCULAR LENS  IMPLANT, BILATERAL     COLONOSCOPY     KNEE ARTHROSCOPY W/ MENISCECTOMY Bilateral X2  LEFT /    X1  RIGHT   KNEE OPEN LATERAL RELEASE Bilateral    MOHS SURGERY Left 12/15/2017   Melanoma in situ - left calf - [facility]   REPLACEMENT TOTAL KNEE Left 2006   SHOULDER ARTHROSCOPY WITH SUBACROMIAL DECOMPRESSION, ROTATOR CUFF REPAIR AND BICEP TENDON REPAIR Right 05/23/2013   Procedure: RIGHT SHOULDER ARTHROSCOPY EXAM UNDER ANESTHESIA  WITH SUBACROMIAL DECOMPRESSION,DISTAL CLAVICLE RESECTION, SADLABRAL DEBRIDEMENT CHONDROPLASTY, BICEP TENOTOMY ;  Surgeon: Sydnee Cabal, MD;  Location: [facility];  Service: Orthopedics;  Laterality: Right;   TONSILLECTOMY AND ADENOIDECTOMY  AGE  17   TOTAL HIP ARTHROPLASTY Left 05/04/2017   Procedure: LEFT TOTAL HIP ARTHROPLASTY ANTERIOR APPROACH;  Surgeon: Paralee Cancel, MD;  Location: [facility];  Service: Orthopedics;  Laterality: Left;   TOTAL HIP ARTHROPLASTY Right 08/01/2019   Procedure: TOTAL HIP ARTHROPLASTY ANTERIOR APPROACH;  Surgeon: Paralee Cancel, MD;  Location: [facility];  Service: Orthopedics;  Laterality: Right;  15mins   TOTAL HIP ARTHROPLASTY Right 08/01/2019   TOTAL KNEE ARTHROPLASTY Bilateral LEFT  1996/   RIGHT 2004   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 Her smoking use included cigarettes. She has a 7.50 pack-year smoking history. She has never used smokeless tobacco. She reports that she does not drink alcohol and does not use drugs.  Allergies  Allergen Reactions   Otezla [Apremilast] Anaphylaxis    Suicidal ideation   Penicillins Anaphylaxis    Has patient had a PCN reaction causing immediate rash, facial/tongue/throat swelling, SOB or lightheadedness with hypotension: Yes Has patient had a PCN reaction causing severe rash involving mucus membranes or skin necrosis: Yes Has patient had a PCN reaction that required hospitalization: No Has patient had a PCN reaction occurring within the last 10 year No If all of the above answers are "NO", then may proceed with Cephalosporin use.    Erythromycin Other (See Comments)    SEVERE STOMACH CRAMPS   Morphine And Related Nausea And Vomiting   Nsaids Other (See Comments)    SEVERE STOMACH CRAMPS, MOUTH SORES **Able to tolerate Tylenol   Remicade [Infliximab] Other (See Comments)    Shut down immune system-BP high   Tolmetin     SEVERE STOMACH CRAMPS   Nickel Rash    Including snaps on hospital gowns     Family History  Problem Relation Age of Onset   Heart disease Father 71   Hypertension Father    Melanoma Mother    Cancer Sister        skin CA, 2 sisters   Esophageal cancer Brother    Dementia Paternal Grandfather    Colon cancer Neg Hx    Colon polyps Neg Hx    Stomach cancer Neg Hx    Rectal cancer Neg Hx      Prior to Admission medications   Medication Sig Start Date End Date Taking? Authorizing Provider  betamethasone dipropionate 0.05 % lotion APPLY EXTERNALLY TO THE AFFECTED AREA DAILY 06/26/20  Yes Nafziger, Tommi Rumps, NP  Calcium-Magnesium-Zinc (CAL-MAG-ZINC PO) Take 1 tablet by mouth every evening.   Yes [provider]  Cholecalciferol (VITAMIN D3) 125 MCG (5000 UT) TABS Take 5,000 Units by mouth every  evening.   Yes [provider]  escitalopram (LEXAPRO) 10 MG tablet Take 1.5 tablets (15 mg total) by mouth daily. 07/24/20  Yes [name], MD  famotidine (PEPCID) 20 MG tablet Take 1 tablet (20 mg total) by mouth daily as needed for heartburn or indigestion. 07/04/20  Yes Armbruster, [name], MD  FIBER PO Take 1 capsule by mouth in the morning and at bedtime.   Yes [provider]  hydrocortisone cream 1 % Apply 1 application topically daily as needed for itching.   Yes [provider]  L-Methylfolate-B12-B6-B2 (CEREFOLIN PO) Take 1 tablet by mouth daily.   Yes [provider]  losartan (COZAAR) 50 MG tablet Take 1 tablet by mouth daily. 05/07/20  Yes [provider]  methotrexate (RHEUMATREX) 2.5 MG tablet Take 15 mg by mouth once a week. 07/08/20  Yes [provider]  sulfamethoxazole-trimethoprim (BACTRIM,SEPTRA) 400-80 MG tablet Take 1 tablet by mouth daily. For urethritis.   Yes [name], MD  SUMAtriptan (IMITREX) 100 MG tablet TAKE 1 TABLET BY MOUTH AS NEEDED FOR MIGRAINE. MAY REPEAT DOSE IN 2 HOURS IF HEADACHE PERSISTS OR RECURS 03/01/20  Yes Nafziger, Tommi Rumps, NP  traZODone (DESYREL) 50 MG tablet TAKE 1 TABLET(50 MG) BY MOUTH AT BEDTIME 05/22/20  Yes Nafziger, Tommi Rumps, NP  atorvastatin (LIPITOR) 20 MG tablet Take 1 tablet (20 mg total) by mouth daily at 6 PM. Patient not taking: Reported on 07/31/2020 01/30/20   [name], NP  clindamycin (CLEOCIN) 300 MG capsule clindamycin HCl 300 mg capsule  TAKE 3 CAPSULES BY MOUTH 1 HOUR BEFORE DENTAL APPOINTMENT Patient not taking: Reported on 07/31/2020    [provider]    Physical Exam: Vitals:   07/31/20 2115 07/31/20 2225 07/31/20 2230 07/31/20 2245  BP: (!) 198/92 (!) 200/87 (!) 188/84 (!) 184/83  Pulse: 72 75 72 70  Resp: 19 15 12 17   Temp:      TempSrc:      SpO2: 100% 100% 100% 100%      Constitutional: Acutely ill looking, in significant distress due to  pain Vitals:   07/31/20 2115 07/31/20 2225 07/31/20 2230 07/31/20 2245  BP: (!) 198/92 (!) 200/87 (!) 188/84 (!) 184/83  Pulse: 72 75 72 70  Resp: 19 15 12 17   Temp:      TempSrc:      SpO2: 100% 100% 100% 100%   Eyes: PERRL, lids and conjunctivae normal ENMT: Mucous membranes are dry. Posterior pharynx clear of any exudate or lesions.Normal dentition.  Neck: normal, supple, no masses, no thyromegaly Respiratory: clear to auscultation bilaterally, no wheezing, no crackles. Normal respiratory effort. No accessory muscle use.  Cardiovascular: Regular rate and rhythm, no murmurs / rubs / gallops. No extremity edema. 2+ pedal pulses. No carotid bruits.  Abdomen: no tenderness, no masses palpated. No hepatosplenomegaly. Bowel sounds positive.  Musculoskeletal: no clubbing / cyanosis.  Right ankle swollen, tender, disfigured with lateral rotation. Normal muscle tone.  Skin: no rashes, lesions, ulcers. No induration Neurologic: CN 2-12 grossly intact. Sensation intact, DTR normal. Strength 5/5 in all 4.  Psychiatric: Normal judgment and insight. Alert and oriented x 3.  Anxious mood.     Labs on Admission: I have personally reviewed following labs and imaging studies  CBC: Recent Labs  Lab 07/31/20 1706  WBC 8.2  NEUTROABS 6.1  HGB 12.0  HCT 35.6*  MCV 99.2  PLT 161   Basic Metabolic Panel: Recent Labs  Lab 07/31/20 1706  NA 138  K 3.5  CL 107  CO2 25  GLUCOSE 109*  BUN 13  CREATININE 0.60  CALCIUM 9.0   GFR: Estimated Creatinine Clearance: 70.8 mL/min (by C-G formula based on SCr of 0.6 mg/dL). Liver Function Tests: Recent Labs  Lab 07/31/20 1706  AST 23  ALT 24  ALKPHOS 60  BILITOT 0.6  PROT 6.8  ALBUMIN 3.8   No results for input(s): LIPASE, AMYLASE in the last 168 hours. No results for input(s): AMMONIA in the last 168 hours. Coagulation Profile: No results for input(s): INR, PROTIME in the last 168 hours. Cardiac Enzymes: No results for input(s):  CKTOTAL, CKMB, CKMBINDEX, TROPONINI in the last 168 hours. BNP (last 3 results) No results for input(s): PROBNP in the  last 8760 hours. HbA1C: No results for input(s): HGBA1C in the last 72 hours. CBG: No results for input(s): GLUCAP in the last 168 hours. Lipid Profile: No results for input(s): CHOL, HDL, LDLCALC, TRIG, CHOLHDL, LDLDIRECT in the last 72 hours. Thyroid Function Tests: No results for input(s): TSH, T4TOTAL, FREET4, T3FREE, THYROIDAB in the last 72 hours. Anemia Panel: No results for input(s): VITAMINB12, FOLATE, FERRITIN, TIBC, IRON, RETICCTPCT in the last 72 hours. Urine analysis:    Component Value Date/Time   COLORURINE YELLOW 10/29/2014 0125   APPEARANCEUR CLEAR 10/29/2014 0125   LABSPEC 1.012 10/29/2014 0125   PHURINE 6.5 10/29/2014 0125   GLUCOSEU NEGATIVE 10/29/2014 0125   HGBUR TRACE (A) 10/29/2014 0125   HGBUR trace-lysed 10/29/2008 1057   BILIRUBINUR N 07/20/2017 1134   KETONESUR 40 (A) 10/29/2014 0125   PROTEINUR Positive (A) 07/20/2017 1134   PROTEINUR NEGATIVE 10/29/2014 0125   UROBILINOGEN 0.2 07/20/2017 1134   UROBILINOGEN 1.0 10/29/2014 0125   NITRITE N 07/20/2017 1134   NITRITE NEGATIVE 10/29/2014 0125   LEUKOCYTESUR Negative 07/20/2017 1134   Sepsis Labs: @LABRCNTIP (procalcitonin:4,lacticidven:4) ) Recent Results (from the past 240 hour(s))  Resp Panel by RT-PCR (Flu A&B, Covid) Nasopharyngeal Swab     Status: None   Collection Time: 07/31/20  7:44 PM   Specimen: Nasopharyngeal Swab; Nasopharyngeal(NP) swabs in vial transport medium  Result Value Ref Range Status   SARS Coronavirus 2 by RT PCR NEGATIVE NEGATIVE Final    Comment: (NOTE) SARS-CoV-2 target nucleic acids are NOT DETECTED.  The SARS-CoV-2 RNA is generally detectable in upper respiratory specimens during the acute phase of infection. The lowest concentration of SARS-CoV-2 viral copies this assay can detect is 138 copies/mL. A negative result does not preclude  SARS-Cov-2 infection and should not be used as the sole basis for treatment or other patient management decisions. A negative result may occur with  improper specimen collection/handling, submission of specimen other than nasopharyngeal swab, presence of viral mutation(s) within the areas targeted by this assay, and inadequate number of viral copies(<138 copies/mL). A negative result must be combined with clinical observations, patient history, and epidemiological information. The expected result is Negative.  Fact Sheet for Patients:  EntrepreneurPulse.com.au  Fact Sheet for Healthcare Providers:  IncredibleEmployment.be  This test is no t yet approved or cleared by the [place] FDA and  has been authorized for detection and/or diagnosis of SARS-CoV-2 by FDA under an Emergency Use Authorization (EUA). This EUA will remain  in effect (meaning this test can be used) for the duration of the COVID-19 declaration under Section 564(b)(1) of the Act, 21 U.S.C.section 360bbb-3(b)(1), unless the authorization is terminated  or revoked sooner.       Influenza A by PCR NEGATIVE NEGATIVE Final   Influenza B by PCR NEGATIVE NEGATIVE Final    Comment: (NOTE) The Xpert Xpress SARS-CoV-2/FLU/RSV plus assay is intended as an aid in the diagnosis of influenza from Nasopharyngeal swab specimens and should not be used as a sole basis for treatment. Nasal washings and aspirates are unacceptable for Xpert Xpress SARS-CoV-2/FLU/RSV testing.  Fact Sheet for Patients: EntrepreneurPulse.com.au  Fact Sheet for Healthcare Providers: IncredibleEmployment.be  This test is not yet approved or cleared by the [place] FDA and has been authorized for detection and/or diagnosis of SARS-CoV-2 by FDA under an Emergency Use Authorization (EUA). This EUA will remain in effect (meaning this test can be used) for the duration of  the COVID-19 declaration under Section 564(b)(1) of the Act, 21 U.S.C. section  360bbb-3(b)(1), unless the authorization is terminated or revoked.  Performed at [facility], [place] [address] [place], [place] [address]      Radiological Exams on Admission: DG Ankle Complete Right  Result Date: 07/31/2020 CLINICAL DATA:  Right ankle pain after a fall without obvious deformity. EXAM: RIGHT ANKLE - COMPLETE 3+ VIEW COMPARISON:  None. FINDINGS: Trimalleolar fracture dislocation of the right ankle. There is complete posterior dislocation of the talus with respect to the tibia. Coronal fracture of the posterior malleolus with posterior displacement of the fracture fragments. Oblique fracture of the distal fibula with lateral angulation and mild displacement of the fracture fragments. Comminuted fractures across the base of the medial malleolus extending into the tibial metaphysis with lateral displacement and angulation of fracture fragments. Old screw fixation of the first tarsometatarsal joint. IMPRESSION: Trimalleolar fracture dislocation of the right ankle. Electronically Signed   By: Lucienne Capers M.D.   On: 07/31/2020 18:09   CT Head Wo Contrast  Result Date: 07/31/2020 CLINICAL DATA:  75 year old female with head trauma. EXAM: CT HEAD WITHOUT CONTRAST CT CERVICAL SPINE WITHOUT CONTRAST TECHNIQUE: Multidetector CT imaging of the head and cervical spine was performed following the standard protocol without intravenous contrast. Multiplanar CT image reconstructions of the cervical spine were also generated. COMPARISON:  None. FINDINGS: CT HEAD FINDINGS Brain: Mild age-related atrophy and chronic microvascular ischemic changes. There is no acute intracranial hemorrhage. No mass effect or midline shift. No extra-axial fluid collection. Vascular: No hyperdense vessel or unexpected calcification. Skull: Normal. Negative for fracture or focal lesion. Sinuses/Orbits: No acute finding.  Other: None CT CERVICAL SPINE FINDINGS Alignment: No acute subluxation. Skull base and vertebrae: No acute fracture. Osteopenia Soft tissues and spinal canal: No prevertebral fluid or swelling. No visible canal hematoma. Disc levels: Degenerative changes with disc desiccation and vacuum phenomena at C4-C5. Upper chest: Negative. Other: None IMPRESSION: 1. No acute intracranial pathology. Mild age-related atrophy and chronic microvascular ischemic changes. 2. No acute/traumatic cervical spine pathology. Electronically Signed   By: Anner Crete M.D.   On: 07/31/2020 18:41   CT Cervical Spine Wo Contrast  Result Date: 07/31/2020 CLINICAL DATA:  75 year old female with head trauma. EXAM: CT HEAD WITHOUT CONTRAST CT CERVICAL SPINE WITHOUT CONTRAST TECHNIQUE: Multidetector CT imaging of the head and cervical spine was performed following the standard protocol without intravenous contrast. Multiplanar CT image reconstructions of the cervical spine were also generated. COMPARISON:  None. FINDINGS: CT HEAD FINDINGS Brain: Mild age-related atrophy and chronic microvascular ischemic changes. There is no acute intracranial hemorrhage. No mass effect or midline shift. No extra-axial fluid collection. Vascular: No hyperdense vessel or unexpected calcification. Skull: Normal. Negative for fracture or focal lesion. Sinuses/Orbits: No acute finding. Other: None CT CERVICAL SPINE FINDINGS Alignment: No acute subluxation. Skull base and vertebrae: No acute fracture. Osteopenia Soft tissues and spinal canal: No prevertebral fluid or swelling. No visible canal hematoma. Disc levels: Degenerative changes with disc desiccation and vacuum phenomena at C4-C5. Upper chest: Negative. Other: None IMPRESSION: 1. No acute intracranial pathology. Mild age-related atrophy and chronic microvascular ischemic changes. 2. No acute/traumatic cervical spine pathology. Electronically Signed   By: Anner Crete M.D.   On: 07/31/2020 18:41    CT Ankle Right Wo Contrast  Result Date: 07/31/2020 CLINICAL DATA:  Trimalleolar right ankle fracture post reduction. EXAM: CT OF THE RIGHT ANKLE WITHOUT CONTRAST TECHNIQUE: Multidetector CT imaging of the right ankle was performed according to the standard protocol. Multiplanar CT image reconstructions were also generated.  COMPARISON:  Radiographs 07/31/2020. FINDINGS: Bones/Joint/Cartilage The ankle is splinted. The previously demonstrated posterior tibiotalar dislocation has been reduced. Mildly comminuted and displaced fracture of the posterior tibial plafond demonstrates 3 mm of impaction of the articular surface posteriorly. This component of the fracture extends into the distal tibiofibular joint which is mildly widened. There is a mildly displaced and mildly comminuted fracture through the base of the medial malleolus which extends to the articular surface of the tibial plafond anteriorly. There is mild residual anteromedial widening of the tibiotalar joint with interposed small fracture fragments and a small ankle joint effusion. There is a comminuted and mildly displaced fracture of the distal fibular shaft which is located 1.8 cm proximal to the ankle mortise. This fracture demonstrates up to 7 mm of residual lateral displacement. The talar dome and additional visualized tarsal bones appear intact. There are postsurgical changes related to previous 1st tarsometatarsal arthrodesis. Moderately advanced midfoot degenerative changes are noted. Ligaments Suboptimally assessed by CT. Muscles and Tendons The ankle tendons appear grossly intact without entrapment in the fractures. Soft tissues Moderate soft tissue swelling surrounding the fractures in the distal lower leg. No evidence of foreign body, soft tissue emphysema or significant hematoma. IMPRESSION: 1. Residual displacement of trimalleolar fracture as described. The tibiotalar dislocation has been reduced, although there is mild residual widening  of the ankle mortise and tibiotalar joint space widening anteromedially with interposed small fracture fragments. 2. No evidence of tarsal bone fracture. 3. Moderate midfoot degenerative changes. 4. No gross tendon rupture or entrapment identified. Electronically Signed   By: Richardean Sale M.D.   On: 07/31/2020 21:47   DG Ankle Right Port  Result Date: 07/31/2020 CLINICAL DATA:  Post reduction EXAM: PORTABLE RIGHT ANKLE - 2 VIEW COMPARISON:  07/31/2020 FINDINGS: Casting material limits bone detail. Redemonstrated trimalleolar fracture. Comminuted distal fibular fracture with decreased angulation, but with residual 1/2 shaft diameter posterior and 1/4 shaft diameter lateral displacement of largest fracture fragment. Comminuted medial malleolar fracture with overall decreased displacement. Displaced posterior malleolar fracture fragment. Reduction of previously noted ankle dislocation but with residual mild posterior and lateral subluxation of the talar dome with respect to the distal tibia. Residual asymmetric widening of the superomedial mortise and anterior ankle joint. Fixating hardware at the base of the great toe. IMPRESSION: 1. Trimalleolar fracture with overall decreased fracture displacement compared to prior. Reduction of ankle dislocation though with residual mild lateral and posterior subluxation of the talar dome with respect to the distal tibia Electronically Signed   By: Donavan Foil M.D.   On: 07/31/2020 21:04      Assessment/Plan Principal Problem:   Trimalleolar fracture Active Problems:   Hyperlipidemia   Essential hypertension, benign   S/P left THA, AA   Obese     #1 trimalleolar fracture: Status post mechanical fall.  Patient will be admitted for pain management.  Initial reduction done in the ER.  Patient still in significant pain.  Has tried fentanyl with no significant relief.  Patient wants to use Dilaudid which we will use.  Continue current treatment. Continue per  orthopedics  #2 status post mechanical fall: PT OT after surgery.  Gait training  #3 essential hypertension: Blood pressure markedly elevated most likely due to pain.  Continue with home losartan.  Add clonidine as needed  #4 morbid obesity: Dietary counseling.  #5 hyperlipidemia: Continue home statin.  DVT prophylaxis: Lovenox Code Status: Full code Family Communication: No family at bedside Disposition Plan: Home Consults called: Orthopedic surgery Dr.  S[place]teck Admission status: Inpatient  Severity of Illness: The appropriate patient status for this patient is INPATIENT. Inpatient status is judged to be reasonable and necessary in order to provide the required intensity of service to ensure the patient's safety. The patient's presenting symptoms, physical exam findings, and initial radiographic and laboratory data in the context of their chronic comorbidities is felt to place them at high risk for further clinical deterioration. Furthermore, it is not anticipated that the patient will be medically stable for discharge from the hospital within 2 midnights of admission. The following factors support the patient status of inpatient.   " The patient's presenting symptoms include fall with right ankle fracture. " The worrisome physical exam findings include swollen right ankle. " The initial radiographic and laboratory data are worrisome because of displaced trimalleolar fracture. " The chronic co-morbidities include osteoarthritis.   * I certify that at the point of admission it is my clinical judgment that the patient will require inpatient hospital care spanning beyond 2 midnights from the point of admission due to high intensity of service, high risk for further deterioration and high frequency of surveillance required.Barbette Merino MD [facility] Pager [phone]  If 7PM-7AM, please contact night-coverage [email or web address] Password [facility]  07/31/2020, 11:21 PM

## 2020-07-31 NOTE — ED Provider Notes (Signed)
[facility] DEPT Provider Note   CSN: [id number] Arrival date & time: 07/31/20  1643     History Chief Complaint  Patient presents with   Fall   Ankle Injury   Head Laceration    Tonya Bartlett is a 75 y.o. female.  HPI Patient is a 75 year old female with a medical history as noted below.  She presents to the emergency department via EMS due to a fall that occurred prior to arrival.  Patient states she was carrying a box, slipped on a curb, inverted the right ankle and fell to the ground.  She struck her posterior head and reports bleeding from the site.  Per information from EMS, patient has a small laceration to the posterior scalp.  Not anticoagulated.  Obvious deformity to the right ankle.  IV was placed in route and patient given 100 mcg of fentanyl.  Patient denies any numbness in the foot.  She reports associated tingling as well as moderate pain.  Also reports pain to the posterior scalp.  No neck or back pain.  No hip or knee pain.  No chest pain or shortness of breath.    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   History of hiatal hernia    History of iron deficiency anemia 1996   iron infusion   History of migraine headaches    History of rib fracture    Hyperlipidemia    IBS (irritable bowel syndrome)    Insomnia    MCI (mild cognitive impairment)    Melanoma ([place]) 2019   left leg    Migraine headache    hx of migraines    OA (osteoarthritis)    RIGHT SHOULDER   Pneumonia    remote history   PONV (postoperative nausea and vomiting)    Recovering alcoholic ([place])    SINCE [id number]   Right rotator cuff tear    Unspecified essential  hypertension     Patient Active Problem List   Diagnosis Date Noted   Right hip OA 08/01/2019   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   Insomnia 02/03/2019   Onychomycosis 11/28/2018   Obese 05/05/2017   S/P left THA, 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   MCI (mild cognitive impairment) 09/19/2015   Memory deficit 09/19/2015   SIRS (systemic inflammatory response syndrome) ([place]) 10/29/2014   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   Depression 11/16/2013   ADD (attention deficit disorder) 11/17/2011   Essential hypertension, benign 10/15/2009   SEBORRHEIC KERATOSIS, INFLAMED 10/17/2008   CARPAL TUNNEL SYNDROME, LEFT 06/22/2008   Allergic rhinitis 08/30/2007   Hyperlipidemia 11/11/2006   Migraine 11/11/2006   Osteoarthritis 11/11/2006    Past Surgical History:  Procedure Laterality Date   BUNIONECTOMY/  HAMMERTOE CORRECTION  RIGHT FOOT  2011   CATARACT EXTRACTION W/ INTRAOCULAR LENS  IMPLANT, BILATERAL     COLONOSCOPY     KNEE ARTHROSCOPY W/ MENISCECTOMY Bilateral X2  LEFT /    X1  RIGHT   KNEE OPEN LATERAL RELEASE Bilateral    MOHS SURGERY Left 12/15/2017   Melanoma in situ - left  [facility]   REPLACEMENT TOTAL KNEE Left 2006   SHOULDER ARTHROSCOPY WITH SUBACROMIAL DECOMPRESSION, ROTATOR CUFF REPAIR AND BICEP TENDON REPAIR Right 05/23/2013   Procedure: RIGHT SHOULDER ARTHROSCOPY EXAM UNDER ANESTHESIA  WITH SUBACROMIAL DECOMPRESSION,DISTAL CLAVICLE RESECTION, SADLABRAL DEBRIDEMENT CHONDROPLASTY, BICEP TENOTOMY ;  Surgeon: Sydnee Cabal, MD;  Location: [facility];  Service: Orthopedics;  Laterality: Right;   TONSILLECTOMY AND ADENOIDECTOMY  AGE 89   TOTAL HIP ARTHROPLASTY Left 05/04/2017   Procedure: LEFT TOTAL HIP ARTHROPLASTY ANTERIOR APPROACH;  Surgeon: Paralee Cancel, MD;  Location: [facility];  Service: Orthopedics;  Laterality: Left;   TOTAL HIP ARTHROPLASTY Right 08/01/2019   Procedure: TOTAL HIP ARTHROPLASTY ANTERIOR APPROACH;  Surgeon: Paralee Cancel, MD;  Location: [facility];  Service: Orthopedics;  Laterality: Right;  84mins   TOTAL HIP ARTHROPLASTY Right 08/01/2019   TOTAL KNEE ARTHROPLASTY Bilateral LEFT  1996/   RIGHT 2004   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     Family History  Problem Relation Age of Onset   Heart disease Father 28   Hypertension Father    Melanoma Mother    Cancer Sister        skin CA, 2 sisters   Esophageal cancer Brother    Dementia Paternal Grandfather    Colon cancer Neg Hx    Colon polyps Neg Hx    Stomach cancer Neg Hx    Rectal cancer Neg Hx     Social History   Tobacco Use   Smoking status: Former    Packs/day: 0.50    Years: 15.00    Pack years: 7.50    Types: Cigarettes    Quit date: 05/15/1985    Years since quitting: 35.2   Smokeless tobacco: Never  Vaping Use   Vaping Use: Never used  Substance Use Topics   Alcohol use: No    Comment: RECOVERING ALCOHOLIC--  QUIT [id number]   Drug use: No    Home Medications Prior to Admission medications   Medication Sig Start Date End Date Taking? Authorizing Provider  atorvastatin (LIPITOR) 20 MG tablet Take 1 tablet (20 mg total) by mouth daily at 6 PM. 01/30/20   Nafziger, Tommi Rumps, NP  betamethasone dipropionate 0.05 % lotion APPLY EXTERNALLY TO THE AFFECTED AREA DAILY 06/26/20   Nafziger, Tommi Rumps, NP  Calcium-Magnesium-Zinc (CAL-MAG-ZINC PO) Take 1 tablet by mouth every evening.    [provider]  Cholecalciferol (VITAMIN D3) 125 MCG (5000 UT) TABS Take 5,000 Units by mouth every evening.    [provider]  clindamycin (CLEOCIN) 300 MG capsule clindamycin HCl 300 mg capsule  TAKE 3 CAPSULES BY MOUTH 1 HOUR BEFORE DENTAL APPOINTMENT    [provider]  escitalopram (LEXAPRO) 10 MG tablet Take 1.5 tablets (15 mg total) by mouth daily. 07/24/20   [name], MD  famotidine (PEPCID) 20 MG tablet Take 1 tablet  (20 mg total) by mouth daily as needed for heartburn or indigestion. 07/04/20   Armbruster, [name], MD  FIBER PO Take 1 capsule by mouth in the morning and at bedtime.    [provider]  HYDROcodone-acetaminophen (NORCO/VICODIN) 5-325 MG tablet Take 1 tablet by mouth every 6 (six) hours as needed. 04/09/20   [provider]  hydrocortisone cream 1 % Apply 1 application topically daily as needed for itching.  [provider]  L-Methylfolate-B12-B6-B2 (CEREFOLIN PO) Take 1 tablet by mouth daily.    [provider]  losartan (COZAAR) 50 MG tablet Take 1 tablet by mouth daily. 05/07/20   [provider]  methotrexate (RHEUMATREX) 2.5 MG tablet Take 15 mg by mouth once a week. 07/08/20   [provider]  sulfamethoxazole-trimethoprim (BACTRIM,SEPTRA) 400-80 MG tablet Take 1 tablet by mouth daily. For urethritis.    [name], MD  SUMAtriptan (IMITREX) 100 MG tablet TAKE 1 TABLET BY MOUTH AS NEEDED FOR MIGRAINE. MAY REPEAT DOSE IN 2 HOURS IF HEADACHE PERSISTS OR RECURS 03/01/20   [name], NP  traZODone (DESYREL) 50 MG tablet TAKE 1 TABLET(50 MG) BY MOUTH AT BEDTIME 05/22/20   Nafziger, Tommi Rumps, NP    Allergies    [place] Nail [apremilast], Penicillins, Erythromycin, Morphine and related, Nsaids, Remicade [infliximab], Tolmetin, and Nickel  Review of Systems   Review of Systems  All other systems reviewed and are negative. Ten systems reviewed and are negative for acute change, except as noted in the HPI.   Physical Exam Updated Vital Signs BP (!) 201/86   Pulse 67   Temp 98.4 F (36.9 C) (Oral)   Resp 17   SpO2 99%   Physical Exam Vitals and nursing note reviewed.  Constitutional:      General: She is not in acute distress.    Appearance: Normal appearance. She is not ill-appearing, toxic-appearing or diaphoretic.  HENT:     Head: Normocephalic.     Comments: 1 cm laceration with mild bleeding noted to the occipital scalp.  Mild  tenderness at the site.  Bleeding resolved with direct pressure.  No crepitus.    Right Ear: External ear normal.     Left Ear: External ear normal.     Nose: Nose normal.     Mouth/Throat:     Mouth: Mucous membranes are moist.     Pharynx: Oropharynx is clear. No oropharyngeal exudate or posterior oropharyngeal erythema.  Eyes:     General: No scleral icterus.       Right eye: No discharge.        Left eye: No discharge.     Extraocular Movements: Extraocular movements intact.     Conjunctiva/sclera: Conjunctivae normal.     Pupils: Pupils are equal, round, and reactive to light.  Neck:     Comments: No midline C, T, or L-spine pain. Cardiovascular:     Rate and Rhythm: Normal rate and regular rhythm.     Pulses: Normal pulses.     Heart sounds: Normal heart sounds. No murmur heard.   No friction rub. No gallop.     Comments: No chest wall pain. Pulmonary:     Effort: Pulmonary effort is normal. No respiratory distress.     Breath sounds: Normal breath sounds. No stridor. No wheezing, rhonchi or rales.  Abdominal:     General: Abdomen is flat.     Palpations: Abdomen is soft.     Tenderness: There is no abdominal tenderness.     Comments: Protuberant abdomen that is soft and nontender.  Musculoskeletal:        General: Tenderness present. Normal range of motion.     Cervical back: Normal range of motion and neck supple. No tenderness.     Comments: Exquisite tenderness noted circumferentially to the right ankle with an obvious deformity.  2+ DP pulses.  Good cap refill.  Patient able to wiggle the toes of the foot.  Distal sensation  intact.  No pain with manipulation of the pelvic girdle.  No knee pain.  Skin:    General: Skin is warm and dry.  Neurological:     General: No focal deficit present.     Mental Status: She is alert and oriented to person, place, and time.  Psychiatric:        Mood and Affect: Mood normal.        Behavior: Behavior normal.   ED Results /  Procedures / Treatments   Labs (all labs ordered are listed, but only abnormal results are displayed) Labs Reviewed  COMPREHENSIVE METABOLIC PANEL - Abnormal; Notable for the following components:      Result Value   Glucose, Bld 109 (*)    All other components within normal limits  CBC WITH DIFFERENTIAL/PLATELET - Abnormal; Notable for the following components:   RBC 3.59 (*)    HCT 35.6 (*)    All other components within normal limits  RESP PANEL BY RT-PCR (FLU A&B, COVID) [id number]    EKG None  Radiology DG Ankle Complete Right  Result Date: 07/31/2020 CLINICAL DATA:  Right ankle pain after a fall without obvious deformity. EXAM: RIGHT ANKLE - COMPLETE 3+ VIEW COMPARISON:  None. FINDINGS: Trimalleolar fracture dislocation of the right ankle. There is complete posterior dislocation of the talus with respect to the tibia. Coronal fracture of the posterior malleolus with posterior displacement of the fracture fragments. Oblique fracture of the distal fibula with lateral angulation and mild displacement of the fracture fragments. Comminuted fractures across the base of the medial malleolus extending into the tibial metaphysis with lateral displacement and angulation of fracture fragments. Old screw fixation of the first tarsometatarsal joint. IMPRESSION: Trimalleolar fracture dislocation of the right ankle. Electronically Signed   By: Lucienne Capers M.D.   On: 07/31/2020 18:09   CT Head Wo Contrast  Result Date: 07/31/2020 CLINICAL DATA:  75 year old female with head trauma. EXAM: CT HEAD WITHOUT CONTRAST CT CERVICAL SPINE WITHOUT CONTRAST TECHNIQUE: Multidetector CT imaging of the head and cervical spine was performed following the standard protocol without intravenous contrast. Multiplanar CT image reconstructions of the cervical spine were also generated. COMPARISON:  None. FINDINGS: CT HEAD FINDINGS Brain: Mild age-related atrophy and chronic microvascular ischemic changes. There is no  acute intracranial hemorrhage. No mass effect or midline shift. No extra-axial fluid collection. Vascular: No hyperdense vessel or unexpected calcification. Skull: Normal. Negative for fracture or focal lesion. Sinuses/Orbits: No acute finding. Other: None CT CERVICAL SPINE FINDINGS Alignment: No acute subluxation. Skull base and vertebrae: No acute fracture. Osteopenia Soft tissues and spinal canal: No prevertebral fluid or swelling. No visible canal hematoma. Disc levels: Degenerative changes with disc desiccation and vacuum phenomena at C4-C5. Upper chest: Negative. Other: None IMPRESSION: 1. No acute intracranial pathology. Mild age-related atrophy and chronic microvascular ischemic changes. 2. No acute/traumatic cervical spine pathology. Electronically Signed   By: Anner Crete M.D.   On: 07/31/2020 18:41   CT Cervical Spine Wo Contrast  Result Date: 07/31/2020 CLINICAL DATA:  75 year old female with head trauma. EXAM: CT HEAD WITHOUT CONTRAST CT CERVICAL SPINE WITHOUT CONTRAST TECHNIQUE: Multidetector CT imaging of the head and cervical spine was performed following the standard protocol without intravenous contrast. Multiplanar CT image reconstructions of the cervical spine were also generated. COMPARISON:  None. FINDINGS: CT HEAD FINDINGS Brain: Mild age-related atrophy and chronic microvascular ischemic changes. There is no acute intracranial hemorrhage. No mass effect or midline shift. No extra-axial fluid collection. Vascular: No hyperdense  vessel or unexpected calcification. Skull: Normal. Negative for fracture or focal lesion. Sinuses/Orbits: No acute finding. Other: None CT CERVICAL SPINE FINDINGS Alignment: No acute subluxation. Skull base and vertebrae: No acute fracture. Osteopenia Soft tissues and spinal canal: No prevertebral fluid or swelling. No visible canal hematoma. Disc levels: Degenerative changes with disc desiccation and vacuum phenomena at C4-C5. Upper chest: Negative. Other:  None IMPRESSION: 1. No acute intracranial pathology. Mild age-related atrophy and chronic microvascular ischemic changes. 2. No acute/traumatic cervical spine pathology. Electronically Signed   By: Anner Crete M.D.   On: 07/31/2020 18:41   DG Ankle Right Port  Result Date: 07/31/2020 CLINICAL DATA:  Post reduction EXAM: PORTABLE RIGHT ANKLE - 2 VIEW COMPARISON:  07/31/2020 FINDINGS: Casting material limits bone detail. Redemonstrated trimalleolar fracture. Comminuted distal fibular fracture with decreased angulation, but with residual 1/2 shaft diameter posterior and 1/4 shaft diameter lateral displacement of largest fracture fragment. Comminuted medial malleolar fracture with overall decreased displacement. Displaced posterior malleolar fracture fragment. Reduction of previously noted ankle dislocation but with residual mild posterior and lateral subluxation of the talar dome with respect to the distal tibia. Residual asymmetric widening of the superomedial mortise and anterior ankle joint. Fixating hardware at the base of the great toe. IMPRESSION: 1. Trimalleolar fracture with overall decreased fracture displacement compared to prior. Reduction of ankle dislocation though with residual mild lateral and posterior subluxation of the talar dome with respect to the distal tibia Electronically Signed   By: Donavan Foil M.D.   On: 07/31/2020 21:04    Procedures .Marland KitchenLaceration Repair  Date/Time: 07/31/2020 6:29 PM Performed by: Rayna Sexton, PA-C Authorized by: Rayna Sexton, PA-C   Consent:    Consent obtained:  Verbal   Consent given by:  Patient   Risks discussed:  Infection, need for additional repair, pain, poor cosmetic result and poor wound healing   Alternatives discussed:  No treatment and delayed treatment Universal protocol:    Procedure explained and questions answered to patient or proxy's satisfaction: yes     Relevant documents present and verified: yes     Test results  available: yes     Imaging studies available: yes     Required blood products, implants, devices, and special equipment available: yes     Site/side marked: yes     Immediately prior to procedure, a time out was called: yes     Patient identity confirmed:  Verbally with patient Anesthesia:    Anesthesia method:  Topical application   Topical anesthetic:  LET Laceration details:    Location:  Scalp   Scalp location:  Occipital   Length (cm):  1 Pre-procedure details:    Preparation:  Patient was prepped and draped in usual sterile fashion Exploration:    Hemostasis achieved with:  Direct pressure Treatment:    Amount of cleaning:  Standard Skin repair:    Repair method:  Staples   Number of staples:  2 Approximation:    Approximation:  Close Repair type:    Repair type:  Simple Post-procedure details:    Procedure completion:  Tolerated well, no immediate complications Reduction of fracture  Date/Time: 07/31/2020 8:45 PM Performed by: Rayna Sexton, PA-C Authorized by: Rayna Sexton, PA-C  Consent: Verbal consent obtained. Written consent obtained. Consent given by: patient Patient understanding: patient states understanding of the procedure being performed Patient consent: the patient's understanding of the procedure matches consent given Procedure consent: procedure consent matches procedure scheduled Relevant documents: relevant documents present and verified Test results:  test results available and properly labeled Site marked: the operative site was marked Imaging studies: imaging studies available Required items: required blood products, implants, devices, and special equipment available Patient identity confirmed: verbally with patient and arm band Time out: Immediately prior to procedure a "time out" was called to verify the correct patient, procedure, equipment, support staff and site/side marked as required. Preparation: Patient was prepped and draped in the  usual sterile fashion. Local anesthesia used: no  Anesthesia: Local anesthesia used: no  Sedation: Patient sedated: yes Sedation type: moderate (conscious) sedation Sedatives: propofol Analgesia: fentanyl Vitals: Vital signs were monitored during sedation.  Patient tolerance: patient tolerated the procedure well with no immediate complications    Medications Ordered in ED Medications  lidocaine-EPINEPHrine-tetracaine (LET) topical gel (3 mLs Topical Given 07/31/20 1744)  Tdap (BOOSTRIX) injection 0.5 mL (0.5 mLs Intramuscular Given 07/31/20 1742)  fentaNYL (SUBLIMAZE) injection 50 mcg (50 mcg Intravenous Given 07/31/20 1740)  fentaNYL (SUBLIMAZE) injection 25 mcg (25 mcg Intravenous Given 07/31/20 1957)  propofol (DIPRIVAN) 10 mg/mL bolus/IV push 49.5 mg (49.5 mg Intravenous Given 07/31/20 2033)  propofol (DIPRIVAN) 10 mg/mL bolus/IV push 20 mg (20 mg Intravenous Given 07/31/20 2035)  LORazepam (ATIVAN) injection 1 mg (1 mg Intravenous Given 07/31/20 2102)   ED Course  I have reviewed the triage vital signs and the nursing notes.  Pertinent labs & imaging results that were available during my care of the patient were reviewed by me and considered in my medical decision making (see chart for details).  Clinical Course as of 07/31/20 2125  Wed Jul 31, 2020  1915 I spoke to Dr. Lyla Glassing with [facility].  States that Dr. Doran Durand would have to do the surgery and he typically does not operate at [facility].  He is going to reach out to him to see what our options are in regards to surgery.  Recommends that we do a closed reduction.  Postreduction films.  If reduced successfully, he requested a CT scan as well.  Request that we do a posterior splint with a U. [LJ]  1916 Discussed with Dr. Lyla Glassing once again.  He states Dr. Doran Durand will be able to operate on the patient's ankle this Sunday. [LJ]    Clinical Course User Index [LJ] Rayna Sexton, PA-C   MDM Rules/Calculators/A&P                           Pt is a 74 y.o. female who presents to the emergency department with what appears to be a trimalleolar fracture of the right ankle.  Labs: CBC with an RBC of 3.59 as well as a hematocrit of 35.6. CMP with a glucose of 109. Respiratory panel is negative.  Imaging: Initial x-ray of the right ankle shows trimalleolar fracture dislocation of the right ankle. Post reduction films show trimalleolar fracture with overall decreased fracture displacement compared to prior.  Reduction of ankle dislocation though with residual mild lateral and posterior subluxation of the talar dome with respect to the distal fibula. CT scan of the head and cervical spine without contrast shows no acute intracranial pathology.  Mild age-related atrophy and chronic microvascular ischemic changes.  No acute traumatic cervical spine pathology.  I, Rayna Sexton, PA-C, personally reviewed and evaluated these images and lab results as part of my medical decision-making.  Myself as well as my attending physician Dr. Lajean Saver reduced her right ankle.  She also had a small laceration of the posterior scalp  which was closed with staples.  Tdap updated.  Pain controlled with fentanyl and muscle spasms with Ativan.  Given patient's age and social situation do not feel it is reasonable to discharge her home.  Recommended admission and patient is amenable.  Patient was discussed with Dr. Lyla Glassing with [facility] who is on-call tonight.  He consulted with Dr. Doran Durand with [facility] who is planning to do surgery on her right ankle in 4 days.  Will discuss with the medicine team for admission.   Note: Portions of this report may have been transcribed using voice recognition software. Every effort was made to ensure accuracy; however, inadvertent computerized transcription errors may be present.   Final Clinical Impression(s) / ED Diagnoses Final diagnoses:  Closed trimalleolar fracture of right ankle, initial  encounter  Laceration of scalp, initial encounter  Fall, initial encounter   Rx / DC Orders ED Discharge Orders     None        Rayna Sexton, PA-C 07/31/20 2125    Lajean Saver, MD 08/01/20 1645

## 2020-07-31 NOTE — ED Notes (Signed)
ED TO INPATIENT HANDOFF REPORT  Name/Age/Gender Tonya Bartlett 75 y.o. female  Code Status Code Status History    Date Active Date Inactive Code Status Order ID Comments User Context   08/01/2019 1423 08/02/2019 1559 Full Code [id number]  Norman Herrlich Inpatient   05/04/2017 1437 05/05/2017 1727 Full Code [id number]  Norman Herrlich Inpatient   10/29/2014 0303 10/30/2014 1432 Full Code [id number]  Ivor Costa, MD ED    Questions for Most Recent Historical Code Status (Order [id number])         Advance Directive Documentation   Flowsheet Row Most Recent Value  Type of Advance Directive Healthcare Power of [place], Living will  [has not been notarized yet]  Pre-existing out of facility DNR order (yellow form or pink MOST form) --  "MOST" Form in Place? --      Home/SNF/Other Home  Chief Complaint Trimalleolar fracture [S82.853A]  Level of Care/Admitting Diagnosis ED Disposition    ED Disposition  Admit   Condition  --   [place]: [place] [100102]  Level of Care: Med-Surg [16]  May admit patient to [facility] or [facility] if equivalent level of care is available:: No  Covid Evaluation: Asymptomatic Screening Protocol (No Symptoms)  Diagnosis: Trimalleolar fracture [517001]  Admitting Physician: Elwyn Reach [[id number]]  Attending Physician: Elwyn Reach [[id number]]  Estimated length of stay: past midnight tomorrow  Certification:: I certify this patient will need inpatient services for at least 2 midnights         Medical History Past Medical History:  Diagnosis Date  . Allergic rhinitis   . Colitis   . Complication of anesthesia    vomit x1 while on Morphine per pt  . Depression   . Diverticulosis   . Frequency of urination   . GERD (gastroesophageal reflux disease)   . History of colon polyps   . 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 History of hiatal hernia   . History of iron  deficiency anemia 1996   iron infusion  . History of migraine headaches   . History of rib fracture   . Hyperlipidemia   . IBS (irritable bowel syndrome)   . Insomnia   . MCI (mild cognitive impairment)   . Melanoma ([place]) 2019   left leg   . Migraine headache    hx of migraines   . OA (osteoarthritis)    RIGHT SHOULDER  . Pneumonia    remote history  . PONV (postoperative nausea and vomiting)   . 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 Right rotator cuff tear   . Unspecified essential hypertension     Allergies Allergies  Allergen Reactions  . [place] Nail [Apremilast] Anaphylaxis    Suicidal ideation  . Penicillins Anaphylaxis    Has patient had a PCN reaction causing immediate rash, facial/tongue/throat swelling, SOB or lightheadedness with hypotension: Yes Has patient had a PCN reaction causing severe rash involving mucus membranes or skin necrosis: Yes Has patient had a PCN reaction that required hospitalization: No Has patient had a PCN reaction occurring within the last 10 year No If all of the above answers are "NO", then may proceed with Cephalosporin use.   . Erythromycin Other (See Comments)    SEVERE STOMACH CRAMPS  . Morphine And Related Nausea And Vomiting  . Nsaids Other (See Comments)    SEVERE STOMACH CRAMPS, MOUTH SORES **Able  to tolerate Tylenol  . Remicade [Infliximab] Other (See Comments)    Shut down immune system-BP high  . Tolmetin     SEVERE STOMACH CRAMPS  . Nickel Rash    Including snaps on hospital gowns     IV Location/Drains/Wounds Patient Lines/Drains/Airways Status    Active Line/Drains/Airways    Name Placement date Placement time Site Days   Peripheral IV 07/31/20 20 G Anterior;Left Hand 07/31/20  1720  Hand  less than 1   Incision (Closed) 08/01/19 Hip Right 08/01/19  1155  -- 365          Labs/Imaging Results for orders placed or performed during the hospital encounter of 07/31/20 (from the past 48 hour(s))   Comprehensive metabolic panel     Status: Abnormal   Collection Time: 07/31/20  5:06 PM  Result Value Ref Range   Sodium 138 135 - 145 mmol/L   Potassium 3.5 3.5 - 5.1 mmol/L   Chloride 107 98 - 111 mmol/L   CO2 25 22 - 32 mmol/L   Glucose, Bld 109 (H) 70 - 99 mg/dL    Comment: Glucose reference range applies only to samples taken after fasting for at least 8 hours.   BUN 13 8 - 23 mg/dL   Creatinine, Ser 0.60 0.44 - 1.00 mg/dL   Calcium 9.0 8.9 - 10.3 mg/dL   Total Protein 6.8 6.5 - 8.1 g/dL   Albumin 3.8 3.5 - 5.0 g/dL   AST 23 15 - 41 U/L   ALT 24 0 - 44 U/L   Alkaline Phosphatase 60 38 - 126 U/L   Total Bilirubin 0.6 0.3 - 1.2 mg/dL   GFR, Estimated >60 >60 mL/min    Comment: (NOTE) Calculated using the CKD-EPI Creatinine Equation (2021)    Anion gap 6 5 - 15    Comment: Performed at [facility], [place] [address] [place], [place] [address]  CBC with Differential     Status: Abnormal   Collection Time: 07/31/20  5:06 PM  Result Value Ref Range   WBC 8.2 4.0 - 10.5 K/uL   RBC 3.59 (L) 3.87 - 5.11 MIL/uL   Hemoglobin 12.0 12.0 - 15.0 g/dL   HCT 35.6 (L) 36.0 - 46.0 %   MCV 99.2 80.0 - 100.0 fL   MCH 33.4 26.0 - 34.0 pg   MCHC 33.7 30.0 - 36.0 g/dL   RDW 12.5 11.5 - 15.5 %   Platelets 242 150 - 400 K/uL   nRBC 0.0 0.0 - 0.2 %   Neutrophils Relative % 74 %   Neutro Abs 6.1 1.7 - 7.7 K/uL   Lymphocytes Relative 15 %   Lymphs Abs 1.3 0.7 - 4.0 K/uL   Monocytes Relative 9 %   Monocytes Absolute 0.7 0.1 - 1.0 K/uL   Eosinophils Relative 1 %   Eosinophils Absolute 0.1 0.0 - 0.5 K/uL   Basophils Relative 1 %   Basophils Absolute 0.1 0.0 - 0.1 K/uL   Immature Granulocytes 0 %   Abs Immature Granulocytes 0.02 0.00 - 0.07 K/uL    Comment: Performed at [facility], [place] [address], [place], [place] [address]  Resp Panel by RT-PCR (Flu A&B, Covid) Nasopharyngeal Swab     Status: None   Collection Time: 07/31/20  7:44 PM   Specimen:  Nasopharyngeal Swab; Nasopharyngeal(NP) swabs in vial transport medium  Result Value Ref Range   SARS Coronavirus 2 by RT PCR NEGATIVE NEGATIVE    Comment: (NOTE) SARS-CoV-2 target  nucleic acids are NOT DETECTED.  The SARS-CoV-2 RNA is generally detectable in upper respiratory specimens during the acute phase of infection. The lowest concentration of SARS-CoV-2 viral copies this assay can detect is 138 copies/mL. A negative result does not preclude SARS-Cov-2 infection and should not be used as the sole basis for treatment or other patient management decisions. A negative result may occur with  improper specimen collection/handling, submission of specimen other than nasopharyngeal swab, presence of viral mutation(s) within the areas targeted by this assay, and inadequate number of viral copies(<138 copies/mL). A negative result must be combined with clinical observations, patient history, and epidemiological information. The expected result is Negative.  Fact Sheet for Patients:  EntrepreneurPulse.com.au  Fact Sheet for Healthcare Providers:  IncredibleEmployment.be  This test is no t yet approved or cleared by the [place] FDA and  has been authorized for detection and/or diagnosis of SARS-CoV-2 by FDA under an Emergency Use Authorization (EUA). This EUA will remain  in effect (meaning this test can be used) for the duration of the COVID-19 declaration under Section 564(b)(1) of the Act, 21 U.S.C.section 360bbb-3(b)(1), unless the authorization is terminated  or revoked sooner.       Influenza A by PCR NEGATIVE NEGATIVE   Influenza B by PCR NEGATIVE NEGATIVE    Comment: (NOTE) The Xpert Xpress SARS-CoV-2/FLU/RSV plus assay is intended as an aid in the diagnosis of influenza from Nasopharyngeal swab specimens and should not be used as a sole basis for treatment. Nasal washings and aspirates are unacceptable for Xpert Xpress  SARS-CoV-2/FLU/RSV testing.  Fact Sheet for Patients: EntrepreneurPulse.com.au  Fact Sheet for Healthcare Providers: IncredibleEmployment.be  This test is not yet approved or cleared by the [place] FDA and has been authorized for detection and/or diagnosis of SARS-CoV-2 by FDA under an Emergency Use Authorization (EUA). This EUA will remain in effect (meaning this test can be used) for the duration of the COVID-19 declaration under Section 564(b)(1) of the Act, 21 U.S.C. section 360bbb-3(b)(1), unless the authorization is terminated or revoked.  Performed at [facility], [place] [address], [place],  [address]    DG Ankle Complete Right  Result Date: 07/31/2020 CLINICAL DATA:  Right ankle pain after a fall without obvious deformity. EXAM: RIGHT ANKLE - COMPLETE 3+ VIEW COMPARISON:  None. FINDINGS: Trimalleolar fracture dislocation of the right ankle. There is complete posterior dislocation of the talus with respect to the tibia. Coronal fracture of the posterior malleolus with posterior displacement of the fracture fragments. Oblique fracture of the distal fibula with lateral angulation and mild displacement of the fracture fragments. Comminuted fractures across the base of the medial malleolus extending into the tibial metaphysis with lateral displacement and angulation of fracture fragments. Old screw fixation of the first tarsometatarsal joint. IMPRESSION: Trimalleolar fracture dislocation of the right ankle. Electronically Signed   By: Lucienne Capers M.D.   On: 07/31/2020 18:09   CT Head Wo Contrast  Result Date: 07/31/2020 CLINICAL DATA:  75 year old female with head trauma. EXAM: CT HEAD WITHOUT CONTRAST CT CERVICAL SPINE WITHOUT CONTRAST TECHNIQUE: Multidetector CT imaging of the head and cervical spine was performed following the standard protocol without intravenous contrast. Multiplanar CT image reconstructions of  the cervical spine were also generated. COMPARISON:  None. FINDINGS: CT HEAD FINDINGS Brain: Mild age-related atrophy and chronic microvascular ischemic changes. There is no acute intracranial hemorrhage. No mass effect or midline shift. No extra-axial fluid collection. Vascular: No hyperdense vessel or unexpected calcification. Skull: Normal. Negative  for fracture or focal lesion. Sinuses/Orbits: No acute finding. Other: None CT CERVICAL SPINE FINDINGS Alignment: No acute subluxation. Skull base and vertebrae: No acute fracture. Osteopenia Soft tissues and spinal canal: No prevertebral fluid or swelling. No visible canal hematoma. Disc levels: Degenerative changes with disc desiccation and vacuum phenomena at C4-C5. Upper chest: Negative. Other: None IMPRESSION: 1. No acute intracranial pathology. Mild age-related atrophy and chronic microvascular ischemic changes. 2. No acute/traumatic cervical spine pathology. Electronically Signed   By: Anner Crete M.D.   On: 07/31/2020 18:41   CT Cervical Spine Wo Contrast  Result Date: 07/31/2020 CLINICAL DATA:  75 year old female with head trauma. EXAM: CT HEAD WITHOUT CONTRAST CT CERVICAL SPINE WITHOUT CONTRAST TECHNIQUE: Multidetector CT imaging of the head and cervical spine was performed following the standard protocol without intravenous contrast. Multiplanar CT image reconstructions of the cervical spine were also generated. COMPARISON:  None. FINDINGS: CT HEAD FINDINGS Brain: Mild age-related atrophy and chronic microvascular ischemic changes. There is no acute intracranial hemorrhage. No mass effect or midline shift. No extra-axial fluid collection. Vascular: No hyperdense vessel or unexpected calcification. Skull: Normal. Negative for fracture or focal lesion. Sinuses/Orbits: No acute finding. Other: None CT CERVICAL SPINE FINDINGS Alignment: No acute subluxation. Skull base and vertebrae: No acute fracture. Osteopenia Soft tissues and spinal canal: No  prevertebral fluid or swelling. No visible canal hematoma. Disc levels: Degenerative changes with disc desiccation and vacuum phenomena at C4-C5. Upper chest: Negative. Other: None IMPRESSION: 1. No acute intracranial pathology. Mild age-related atrophy and chronic microvascular ischemic changes. 2. No acute/traumatic cervical spine pathology. Electronically Signed   By: Anner Crete M.D.   On: 07/31/2020 18:41   CT Ankle Right Wo Contrast  Result Date: 07/31/2020 CLINICAL DATA:  Trimalleolar right ankle fracture post reduction. EXAM: CT OF THE RIGHT ANKLE WITHOUT CONTRAST TECHNIQUE: Multidetector CT imaging of the right ankle was performed according to the standard protocol. Multiplanar CT image reconstructions were also generated. COMPARISON:  Radiographs 07/31/2020. FINDINGS: Bones/Joint/Cartilage The ankle is splinted. The previously demonstrated posterior tibiotalar dislocation has been reduced. Mildly comminuted and displaced fracture of the posterior tibial plafond demonstrates 3 mm of impaction of the articular surface posteriorly. This component of the fracture extends into the distal tibiofibular joint which is mildly widened. There is a mildly displaced and mildly comminuted fracture through the base of the medial malleolus which extends to the articular surface of the tibial plafond anteriorly. There is mild residual anteromedial widening of the tibiotalar joint with interposed small fracture fragments and a small ankle joint effusion. There is a comminuted and mildly displaced fracture of the distal fibular shaft which is located 1.8 cm proximal to the ankle mortise. This fracture demonstrates up to 7 mm of residual lateral displacement. The talar dome and additional visualized tarsal bones appear intact. There are postsurgical changes related to previous 1st tarsometatarsal arthrodesis. Moderately advanced midfoot degenerative changes are noted. Ligaments Suboptimally assessed by CT. Muscles  and Tendons The ankle tendons appear grossly intact without entrapment in the fractures. Soft tissues Moderate soft tissue swelling surrounding the fractures in the distal lower leg. No evidence of foreign body, soft tissue emphysema or significant hematoma. IMPRESSION: 1. Residual displacement of trimalleolar fracture as described. The tibiotalar dislocation has been reduced, although there is mild residual widening of the ankle mortise and tibiotalar joint space widening anteromedially with interposed small fracture fragments. 2. No evidence of tarsal bone fracture. 3. Moderate midfoot degenerative changes. 4. No gross tendon rupture or entrapment identified.  Electronically Signed   By: Richardean Sale M.D.   On: 07/31/2020 21:47   DG Ankle Right Port  Result Date: 07/31/2020 CLINICAL DATA:  Post reduction EXAM: PORTABLE RIGHT ANKLE - 2 VIEW COMPARISON:  07/31/2020 FINDINGS: Casting material limits bone detail. Redemonstrated trimalleolar fracture. Comminuted distal fibular fracture with decreased angulation, but with residual 1/2 shaft diameter posterior and 1/4 shaft diameter lateral displacement of largest fracture fragment. Comminuted medial malleolar fracture with overall decreased displacement. Displaced posterior malleolar fracture fragment. Reduction of previously noted ankle dislocation but with residual mild posterior and lateral subluxation of the talar dome with respect to the distal tibia. Residual asymmetric widening of the superomedial mortise and anterior ankle joint. Fixating hardware at the base of the great toe. IMPRESSION: 1. Trimalleolar fracture with overall decreased fracture displacement compared to prior. Reduction of ankle dislocation though with residual mild lateral and posterior subluxation of the talar dome with respect to the distal tibia Electronically Signed   By: Donavan Foil M.D.   On: 07/31/2020 21:04    Pending Labs Unresulted Labs (From admission, onward)   None       Vitals/Pain Today's Vitals   07/31/20 2225 07/31/20 2230 07/31/20 2243 07/31/20 2245  BP: (!) 200/87 (!) 188/84  (!) 184/83  Pulse: 75 72  70  Resp: 15 12  17   Temp:      TempSrc:      SpO2: 100% 100%  100%  PainSc:   9      Isolation Precautions Airborne and Contact precautions  Medications Medications  lidocaine-EPINEPHrine-tetracaine (LET) topical gel (3 mLs Topical Given 07/31/20 1744)  Tdap (BOOSTRIX) injection 0.5 mL (0.5 mLs Intramuscular Given 07/31/20 1742)  fentaNYL (SUBLIMAZE) injection 50 mcg (50 mcg Intravenous Given 07/31/20 1740)  fentaNYL (SUBLIMAZE) injection 25 mcg (25 mcg Intravenous Given 07/31/20 1957)  propofol (DIPRIVAN) 10 mg/mL bolus/IV push 49.5 mg (49.5 mg Intravenous Given 07/31/20 2033)  propofol (DIPRIVAN) 10 mg/mL bolus/IV push 20 mg (20 mg Intravenous Given 07/31/20 2035)  LORazepam (ATIVAN) injection 1 mg (1 mg Intravenous Given 07/31/20 2102)  cloNIDine (CATAPRES) tablet 0.1 mg (0.1 mg Oral Given 07/31/20 2257)  fentaNYL (SUBLIMAZE) injection 50 mcg (50 mcg Intravenous Given 07/31/20 2257)    Mobility Bedrest due to ankle

## 2020-08-01 ENCOUNTER — Telehealth (HOSPITAL_COMMUNITY): Payer: Medicare Other | Admitting: Psychiatry

## 2020-08-01 DIAGNOSIS — S82851A Displaced trimalleolar fracture of right lower leg, initial encounter for closed fracture: Principal | ICD-10-CM

## 2020-08-01 LAB — COMPREHENSIVE METABOLIC PANEL
ALT: 22 U/L (ref 0–44)
AST: 22 U/L (ref 15–41)
Albumin: 4 g/dL (ref 3.5–5.0)
Alkaline Phosphatase: 61 U/L (ref 38–126)
Anion gap: 7 (ref 5–15)
BUN: 13 mg/dL (ref 8–23)
CO2: 25 mmol/L (ref 22–32)
Calcium: 8.9 mg/dL (ref 8.9–10.3)
Chloride: 104 mmol/L (ref 98–111)
Creatinine, Ser: 0.48 mg/dL (ref 0.44–1.00)
GFR, Estimated: >60 mL/min (ref >60)
Glucose, Bld: 113 mg/dL — ABNORMAL HIGH (ref 70–99)
Potassium: 3.4 mmol/L — ABNORMAL LOW (ref 3.5–5.1)
Sodium: 136 mmol/L (ref 135–145)
Total Bilirubin: 0.8 mg/dL (ref 0.3–1.2)
Total Protein: 6.7 g/dL (ref 6.5–8.1)

## 2020-08-01 LAB — CBC
HCT: 35.4 % — ABNORMAL LOW (ref 36.0–46.0)
Hemoglobin: 11.9 g/dL — ABNORMAL LOW (ref 12.0–15.0)
MCH: 33.7 pg (ref 26.0–34.0)
MCHC: 33.6 g/dL (ref 30.0–36.0)
MCV: 100.3 fL — ABNORMAL HIGH (ref 80.0–100.0)
Platelets: 240 10*3/uL (ref 150–400)
RBC: 3.53 MIL/uL — ABNORMAL LOW (ref 3.87–5.11)
RDW: 12.8 % (ref 11.5–15.5)
WBC: 9.6 10*3/uL (ref 4.0–10.5)
nRBC: 0 % (ref 0.0–0.2)

## 2020-08-01 LAB — CREATININE, SERUM
Creatinine, Ser: 0.55 mg/dL (ref 0.44–1.00)
GFR, Estimated: >60 mL/min (ref >60)

## 2020-08-01 MED ORDER — HYDROMORPHONE HCL 1 MG/ML IJ SOLN
1.0000 mg | INTRAMUSCULAR | Status: DC | PRN
  Administered 2020-08-01 (×3): 1 mg via INTRAVENOUS
  Filled 2020-08-01 (×3): qty 1

## 2020-08-01 MED ORDER — BETAMETHASONE DIPROPIONATE 0.05 % EX LOTN: TOPICAL_LOTION | Freq: Two times a day (BID) | CUTANEOUS | Status: DC

## 2020-08-01 MED ORDER — SENNOSIDES-DOCUSATE SODIUM 8.6-50 MG PO TABS
1.0000 | ORAL_TABLET | Freq: Two times a day (BID) | ORAL | Status: DC
  Administered 2020-08-01 – 2020-08-05 (×8): 1 via ORAL
  Filled 2020-08-01 (×9): qty 1

## 2020-08-01 MED ORDER — ENOXAPARIN SODIUM 40 MG/0.4ML IJ SOSY
40.0000 mg | PREFILLED_SYRINGE | INTRAMUSCULAR | Status: DC
  Administered 2020-08-01 – 2020-08-03 (×3): 40 mg via SUBCUTANEOUS
  Filled 2020-08-01 (×3): qty 0.4

## 2020-08-01 MED ORDER — POLYETHYLENE GLYCOL 3350 17 G PO PACK
17.0000 g | PACK | Freq: Every day | ORAL | Status: DC
  Administered 2020-08-01 – 2020-08-02 (×2): 17 g via ORAL
  Filled 2020-08-01 (×2): qty 1

## 2020-08-01 MED ORDER — ONDANSETRON HCL 4 MG/2ML IJ SOLN
4.0000 mg | Freq: Four times a day (QID) | INTRAMUSCULAR | Status: DC | PRN
  Administered 2020-08-01: 4 mg via INTRAVENOUS
  Filled 2020-08-01: qty 2

## 2020-08-01 MED ORDER — HYDROCORTISONE 1 % EX CREA
1.0000 "application " | TOPICAL_CREAM | Freq: Every day | CUTANEOUS | Status: DC | PRN
  Filled 2020-08-01: qty 28

## 2020-08-01 MED ORDER — HYDROMORPHONE HCL 1 MG/ML IJ SOLN
0.5000 mg | INTRAMUSCULAR | Status: DC | PRN
Start: 2020-08-01 — End: 2020-08-04
  Administered 2020-08-01 – 2020-08-04 (×13): 0.5 mg via INTRAVENOUS
  Filled 2020-08-01 (×13): qty 0.5

## 2020-08-01 MED ORDER — ESCITALOPRAM OXALATE 10 MG PO TABS
15.0000 mg | ORAL_TABLET | Freq: Every day | ORAL | Status: DC
  Administered 2020-08-01 – 2020-08-06 (×7): 15 mg via ORAL
  Filled 2020-08-01 (×6): qty 2

## 2020-08-01 MED ORDER — METHOTREXATE 2.5 MG PO TABS
15.0000 mg | ORAL_TABLET | ORAL | Status: DC
  Administered 2020-08-06: 15 mg via ORAL
  Filled 2020-08-01: qty 6

## 2020-08-01 MED ORDER — FAMOTIDINE 20 MG PO TABS: 20.0000 mg | ORAL_TABLET | Freq: Every day | ORAL | Status: DC | PRN

## 2020-08-01 MED ORDER — HYDROCODONE-ACETAMINOPHEN 5-325 MG PO TABS
1.0000 | ORAL_TABLET | Freq: Four times a day (QID) | ORAL | Status: DC | PRN
  Administered 2020-08-01 – 2020-08-03 (×5): 2 via ORAL
  Filled 2020-08-01 (×5): qty 2

## 2020-08-01 MED ORDER — VITAMIN D 25 MCG (1000 UNIT) PO TABS
5000.0000 [IU] | ORAL_TABLET | Freq: Every evening | ORAL | Status: DC
  Administered 2020-08-01 – 2020-08-05 (×5): 5000 [IU] via ORAL
  Filled 2020-08-01 (×5): qty 5

## 2020-08-01 MED ORDER — HYDRALAZINE HCL 25 MG PO TABS
25.0000 mg | ORAL_TABLET | Freq: Four times a day (QID) | ORAL | Status: DC | PRN
  Administered 2020-08-03 – 2020-08-05 (×2): 25 mg via ORAL
  Filled 2020-08-01 (×2): qty 1

## 2020-08-01 MED ORDER — SUMATRIPTAN SUCCINATE 50 MG PO TABS
100.0000 mg | ORAL_TABLET | ORAL | Status: DC | PRN
  Administered 2020-08-02 – 2020-08-04 (×2): 50 mg via ORAL
  Filled 2020-08-01 (×4): qty 2

## 2020-08-01 MED ORDER — SODIUM CHLORIDE 0.9 % IV SOLN: INTRAVENOUS | Status: DC

## 2020-08-01 MED ORDER — LOSARTAN POTASSIUM 50 MG PO TABS
50.0000 mg | ORAL_TABLET | Freq: Every day | ORAL | Status: DC
  Administered 2020-08-01 – 2020-08-06 (×6): 50 mg via ORAL
  Filled 2020-08-01 (×6): qty 1

## 2020-08-01 MED ORDER — TRAZODONE HCL 50 MG PO TABS
50.0000 mg | ORAL_TABLET | Freq: Every evening | ORAL | Status: DC | PRN
  Administered 2020-08-01 – 2020-08-04 (×4): 50 mg via ORAL
  Filled 2020-08-01 (×4): qty 1

## 2020-08-01 MED ORDER — SULFAMETHOXAZOLE-TRIMETHOPRIM 400-80 MG PO TABS
1.0000 | ORAL_TABLET | Freq: Every day | ORAL | Status: DC
  Administered 2020-08-01 – 2020-08-06 (×7): 1 via ORAL
  Filled 2020-08-01 (×6): qty 1

## 2020-08-01 MED ORDER — ONDANSETRON HCL 4 MG PO TABS: 4.0000 mg | ORAL_TABLET | Freq: Four times a day (QID) | ORAL | Status: DC | PRN

## 2020-08-01 MED ORDER — POTASSIUM CHLORIDE CRYS ER 20 MEQ PO TBCR
40.0000 meq | EXTENDED_RELEASE_TABLET | Freq: Once | ORAL | Status: AC
  Administered 2020-08-01: 40 meq via ORAL
  Filled 2020-08-01: qty 2

## 2020-08-01 NOTE — Consult Note (Signed)
ORTHOPAEDIC CONSULTATION  REQUESTING PHYSICIAN: Elmarie Shiley, MD  PCP:  [name], NP  Chief Complaint: Right ankle fracture  HPI: Tonya Bartlett is a 75 y.o. female who complains of right ankle pain. The patient has medical history significant of GERD, diverticular disease, depression, ADD, migraine headaches, irritable bowel syndrome, hyperlipidemia, essential hypertension, previous history of total hip replacement who presents after mechanical fall with twisting of her right ankle. The patient suffered a mechanical fall upon leaving a building. She immediately felt pain and came to the emergency department Patient is in severe pain 10 out of 10.  Patient found to have trimalleolar fracture of the right ankle.  Orthopedics was consulted for management of the right ankle fracture.  Patient was admitted to the hospitalist service for medical management.    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   History of hiatal hernia    History of iron deficiency anemia 1996   iron infusion   History of migraine headaches    History of rib fracture    Hyperlipidemia    IBS (irritable bowel syndrome)    Insomnia    MCI (mild cognitive impairment)    Melanoma ([place]) 2019   left leg    Migraine headache    hx of migraines    OA (osteoarthritis)    RIGHT SHOULDER   Pneumonia    remote history   PONV (postoperative nausea and vomiting)    Recovering alcoholic ([place])    SINCE [id number]   Right rotator cuff tear    Unspecified essential hypertension    Past Surgical History:  Procedure Laterality Date   BUNIONECTOMY/  HAMMERTOE CORRECTION  RIGHT FOOT  2011   CATARACT EXTRACTION W/  INTRAOCULAR LENS  IMPLANT, BILATERAL     COLONOSCOPY     KNEE ARTHROSCOPY W/ MENISCECTOMY Bilateral X2  LEFT /    X1  RIGHT   KNEE OPEN LATERAL RELEASE Bilateral    MOHS SURGERY Left 12/15/2017   Melanoma in situ - left calf - [facility]   REPLACEMENT TOTAL KNEE Left 2006   SHOULDER ARTHROSCOPY WITH SUBACROMIAL DECOMPRESSION, ROTATOR CUFF REPAIR AND BICEP TENDON REPAIR Right 05/23/2013   Procedure: RIGHT SHOULDER ARTHROSCOPY EXAM UNDER ANESTHESIA  WITH SUBACROMIAL DECOMPRESSION,DISTAL CLAVICLE RESECTION, SADLABRAL DEBRIDEMENT CHONDROPLASTY, BICEP TENOTOMY ;  Surgeon: Sydnee Cabal, MD;  Location: [place];  Service: Orthopedics;  Laterality: Right;   TONSILLECTOMY AND ADENOIDECTOMY  AGE 46   TOTAL HIP ARTHROPLASTY Left 05/04/2017   Procedure: LEFT TOTAL HIP ARTHROPLASTY ANTERIOR APPROACH;  Surgeon: Paralee Cancel, MD;  Location: [facility];  Service: Orthopedics;  Laterality: Left;   TOTAL HIP ARTHROPLASTY Right 08/01/2019   Procedure: TOTAL HIP ARTHROPLASTY ANTERIOR APPROACH;  Surgeon: Paralee Cancel, MD;  Location: [facility];  Service: Orthopedics;  Laterality: Right;  63mins   TOTAL HIP ARTHROPLASTY Right 08/01/2019   TOTAL KNEE ARTHROPLASTY Bilateral LEFT  1996/   RIGHT 2004   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      UPPER GI ENDOSCOPY     VAGINAL HYSTERECTOMY  1976   Social History  Socioeconomic History   Marital status: Widowed    Spouse name: Not on file   Number of children: Not on file   Years of education: Not on file   Highest education level: Master's degree (e.g., MA, MS, MEng, MEd, MSW, MBA)  Occupational History   Not on file  Tobacco Use   Smoking status: Former    Packs/day: 0.50    Years: 15.00    Pack years: 7.50    Types: Cigarettes    Quit date: 05/15/1985    Years since quitting: 35.2   Smokeless tobacco: Never  Vaping Use   Vaping Use: Never used  Substance and Sexual Activity   Alcohol use: No    Comment: RECOVERING ALCOHOLIC--  QUIT  [id number]   Drug use: No   Sexual activity: Never    Birth control/protection: Post-menopausal  Other Topics Concern   Not on file  Social History Narrative   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    Widowed    Three kids    80 grandchildren       Social Determinants of Radio broadcast assistant Strain: Low Risk    Difficulty of Paying Living Expenses: Not hard at all  Food Insecurity: No Food Insecurity   Worried About Charity fundraiser in the Last Year: Never true   Arboriculturist in the Last Year: Never true  Transportation Needs: No Transportation Needs   Lack of Transportation (Medical): No   Lack of Transportation (Non-Medical): No  Physical Activity: Insufficiently Active   Days of Exercise per Week: 7 days   Minutes of Exercise per Session: 20 min  Stress: No Stress Concern Present   Feeling of Stress : Only a little  Social Connections: Moderately Isolated   Frequency of Communication with Friends and Family: More than three times a week   Frequency of Social Gatherings with Friends and Family: More than three times a week   Attends Religious Services: Never   Marine scientist or Organizations: Yes   Attends Archivist Meetings: 1 to 4 times per year   Marital Status: Widowed   Family History  Problem Relation Age of Onset   Heart disease Father 28   Hypertension Father    Melanoma Mother    Cancer Sister        skin CA, 2 sisters   Esophageal cancer Brother    Dementia Paternal Grandfather    Colon cancer Neg Hx    Colon polyps Neg Hx    Stomach cancer Neg Hx    Rectal cancer Neg Hx    Allergies  Allergen Reactions   Otezla [Apremilast] Anaphylaxis    Suicidal ideation   Penicillins Anaphylaxis    Has patient had a PCN reaction causing immediate rash, facial/tongue/throat swelling, SOB or lightheadedness with hypotension: Yes Has patient had a PCN reaction causing severe rash involving  mucus membranes or skin necrosis: Yes Has patient had a PCN reaction that required hospitalization: No Has patient had a PCN reaction occurring within the last 10 year No If all of the above answers are "NO", then may proceed with Cephalosporin use.    Erythromycin Other (See Comments)    SEVERE STOMACH CRAMPS   Morphine And Related Nausea And Vomiting   Nsaids Other (See Comments)    SEVERE STOMACH CRAMPS, MOUTH SORES **Able to tolerate Tylenol   Remicade [Infliximab] Other (See Comments)    Shut down  immune system-BP high   Tolmetin     SEVERE STOMACH CRAMPS   Nickel Rash    Including snaps on hospital gowns    Prior to Admission medications   Medication Sig Start Date End Date Taking? Authorizing Provider  betamethasone dipropionate 0.05 % lotion APPLY EXTERNALLY TO THE AFFECTED AREA DAILY 06/26/20  Yes Nafziger, Tommi Rumps, NP  Calcium-Magnesium-Zinc (CAL-MAG-ZINC PO) Take 1 tablet by mouth every evening.   Yes [provider]  Cholecalciferol (VITAMIN D3) 125 MCG (5000 UT) TABS Take 5,000 Units by mouth every evening.   Yes [provider]  escitalopram (LEXAPRO) 10 MG tablet Take 1.5 tablets (15 mg total) by mouth daily. 07/24/20  Yes [name], MD  famotidine (PEPCID) 20 MG tablet Take 1 tablet (20 mg total) by mouth daily as needed for heartburn or indigestion. 07/04/20  Yes Armbruster, [name], MD  FIBER PO Take 1 capsule by mouth in the morning and at bedtime.   Yes [provider]  hydrocortisone cream 1 % Apply 1 application topically daily as needed for itching.   Yes [provider]  L-Methylfolate-B12-B6-B2 (CEREFOLIN PO) Take 1 tablet by mouth daily.   Yes [provider]  losartan (COZAAR) 50 MG tablet Take 1 tablet by mouth daily. 05/07/20  Yes [provider]  methotrexate (RHEUMATREX) 2.5 MG tablet Take 15 mg by mouth once a week. 07/08/20  Yes [provider]  sulfamethoxazole-trimethoprim (BACTRIM,SEPTRA)  400-80 MG tablet Take 1 tablet by mouth daily. For urethritis.   Yes [name], MD  SUMAtriptan (IMITREX) 100 MG tablet TAKE 1 TABLET BY MOUTH AS NEEDED FOR MIGRAINE. MAY REPEAT DOSE IN 2 HOURS IF HEADACHE PERSISTS OR RECURS 03/01/20  Yes Nafziger, Tommi Rumps, NP  traZODone (DESYREL) 50 MG tablet TAKE 1 TABLET(50 MG) BY MOUTH AT BEDTIME 05/22/20  Yes Nafziger, Tommi Rumps, NP  atorvastatin (LIPITOR) 20 MG tablet Take 1 tablet (20 mg total) by mouth daily at 6 PM. Patient not taking: Reported on 07/31/2020 01/30/20   [name], NP  clindamycin (CLEOCIN) 300 MG capsule clindamycin HCl 300 mg capsule  TAKE 3 CAPSULES BY MOUTH 1 HOUR BEFORE DENTAL APPOINTMENT Patient not taking: Reported on 07/31/2020    [provider]   DG Ankle Complete Right  Result Date: 07/31/2020 CLINICAL DATA:  Right ankle pain after a fall without obvious deformity. EXAM: RIGHT ANKLE - COMPLETE 3+ VIEW COMPARISON:  None. FINDINGS: Trimalleolar fracture dislocation of the right ankle. There is complete posterior dislocation of the talus with respect to the tibia. Coronal fracture of the posterior malleolus with posterior displacement of the fracture fragments. Oblique fracture of the distal fibula with lateral angulation and mild displacement of the fracture fragments. Comminuted fractures across the base of the medial malleolus extending into the tibial metaphysis with lateral displacement and angulation of fracture fragments. Old screw fixation of the first tarsometatarsal joint. IMPRESSION: Trimalleolar fracture dislocation of the right ankle. Electronically Signed   By: Lucienne Capers M.D.   On: 07/31/2020 18:09   CT Head Wo Contrast  Result Date: 07/31/2020 CLINICAL DATA:  75 year old female with head trauma. EXAM: CT HEAD WITHOUT CONTRAST CT CERVICAL SPINE WITHOUT CONTRAST TECHNIQUE: Multidetector CT imaging of the head and cervical spine was performed following the standard protocol without intravenous contrast.  Multiplanar CT image reconstructions of the cervical spine were also generated. COMPARISON:  None. FINDINGS: CT HEAD FINDINGS Brain: Mild age-related atrophy and chronic microvascular ischemic changes. There is no acute intracranial hemorrhage. No mass effect or  midline shift. No extra-axial fluid collection. Vascular: No hyperdense vessel or unexpected calcification. Skull: Normal. Negative for fracture or focal lesion. Sinuses/Orbits: No acute finding. Other: None CT CERVICAL SPINE FINDINGS Alignment: No acute subluxation. Skull base and vertebrae: No acute fracture. Osteopenia Soft tissues and spinal canal: No prevertebral fluid or swelling. No visible canal hematoma. Disc levels: Degenerative changes with disc desiccation and vacuum phenomena at C4-C5. Upper chest: Negative. Other: None IMPRESSION: 1. No acute intracranial pathology. Mild age-related atrophy and chronic microvascular ischemic changes. 2. No acute/traumatic cervical spine pathology. Electronically Signed   By: Anner Crete M.D.   On: 07/31/2020 18:41   CT Cervical Spine Wo Contrast  Result Date: 07/31/2020 CLINICAL DATA:  75 year old female with head trauma. EXAM: CT HEAD WITHOUT CONTRAST CT CERVICAL SPINE WITHOUT CONTRAST TECHNIQUE: Multidetector CT imaging of the head and cervical spine was performed following the standard protocol without intravenous contrast. Multiplanar CT image reconstructions of the cervical spine were also generated. COMPARISON:  None. FINDINGS: CT HEAD FINDINGS Brain: Mild age-related atrophy and chronic microvascular ischemic changes. There is no acute intracranial hemorrhage. No mass effect or midline shift. No extra-axial fluid collection. Vascular: No hyperdense vessel or unexpected calcification. Skull: Normal. Negative for fracture or focal lesion. Sinuses/Orbits: No acute finding. Other: None CT CERVICAL SPINE FINDINGS Alignment: No acute subluxation. Skull base and vertebrae: No acute fracture. Osteopenia  Soft tissues and spinal canal: No prevertebral fluid or swelling. No visible canal hematoma. Disc levels: Degenerative changes with disc desiccation and vacuum phenomena at C4-C5. Upper chest: Negative. Other: None IMPRESSION: 1. No acute intracranial pathology. Mild age-related atrophy and chronic microvascular ischemic changes. 2. No acute/traumatic cervical spine pathology. Electronically Signed   By: Anner Crete M.D.   On: 07/31/2020 18:41   CT Ankle Right Wo Contrast  Result Date: 07/31/2020 CLINICAL DATA:  Trimalleolar right ankle fracture post reduction. EXAM: CT OF THE RIGHT ANKLE WITHOUT CONTRAST TECHNIQUE: Multidetector CT imaging of the right ankle was performed according to the standard protocol. Multiplanar CT image reconstructions were also generated. COMPARISON:  Radiographs 07/31/2020. FINDINGS: Bones/Joint/Cartilage The ankle is splinted. The previously demonstrated posterior tibiotalar dislocation has been reduced. Mildly comminuted and displaced fracture of the posterior tibial plafond demonstrates 3 mm of impaction of the articular surface posteriorly. This component of the fracture extends into the distal tibiofibular joint which is mildly widened. There is a mildly displaced and mildly comminuted fracture through the base of the medial malleolus which extends to the articular surface of the tibial plafond anteriorly. There is mild residual anteromedial widening of the tibiotalar joint with interposed small fracture fragments and a small ankle joint effusion. There is a comminuted and mildly displaced fracture of the distal fibular shaft which is located 1.8 cm proximal to the ankle mortise. This fracture demonstrates up to 7 mm of residual lateral displacement. The talar dome and additional visualized tarsal bones appear intact. There are postsurgical changes related to previous 1st tarsometatarsal arthrodesis. Moderately advanced midfoot degenerative changes are noted. Ligaments  Suboptimally assessed by CT. Muscles and Tendons The ankle tendons appear grossly intact without entrapment in the fractures. Soft tissues Moderate soft tissue swelling surrounding the fractures in the distal lower leg. No evidence of foreign body, soft tissue emphysema or significant hematoma. IMPRESSION: 1. Residual displacement of trimalleolar fracture as described. The tibiotalar dislocation has been reduced, although there is mild residual widening of the ankle mortise and tibiotalar joint space widening anteromedially with interposed small fracture fragments. 2. No evidence of  tarsal bone fracture. 3. Moderate midfoot degenerative changes. 4. No gross tendon rupture or entrapment identified. Electronically Signed   By: Richardean Sale M.D.   On: 07/31/2020 21:47   DG Ankle Right Port  Result Date: 07/31/2020 CLINICAL DATA:  Post reduction EXAM: PORTABLE RIGHT ANKLE - 2 VIEW COMPARISON:  07/31/2020 FINDINGS: Casting material limits bone detail. Redemonstrated trimalleolar fracture. Comminuted distal fibular fracture with decreased angulation, but with residual 1/2 shaft diameter posterior and 1/4 shaft diameter lateral displacement of largest fracture fragment. Comminuted medial malleolar fracture with overall decreased displacement. Displaced posterior malleolar fracture fragment. Reduction of previously noted ankle dislocation but with residual mild posterior and lateral subluxation of the talar dome with respect to the distal tibia. Residual asymmetric widening of the superomedial mortise and anterior ankle joint. Fixating hardware at the base of the great toe. IMPRESSION: 1. Trimalleolar fracture with overall decreased fracture displacement compared to prior. Reduction of ankle dislocation though with residual mild lateral and posterior subluxation of the talar dome with respect to the distal tibia Electronically Signed   By: Donavan Foil M.D.   On: 07/31/2020 21:04    Positive ROS: All other systems  have been reviewed and were otherwise negative with the exception of those mentioned in the HPI and as above.  Physical Exam: General: Alert, no acute distress Cardiovascular: No pedal edema Respiratory: No cyanosis, no use of accessory musculature GI: No organomegaly, abdomen is soft and non-tender Skin: No lesions in the area of chief complaint Neurologic: Sensation intact distally Psychiatric: Patient is competent for consent with normal mood and affect Lymphatic: No axillary or cervical lymphadenopathy  MUSCULOSKELETAL: Right ankle currently in posterior leg splint. EHL strength normal.  Normal sensation  Assessment: Trimalleolar fracture of the right ankle  Plan: Right ankle fracture: Closed reduction and splinting completed in the ED. Risks and benefits of surgery were discussed with the patient and the patient elects to proceed with surgery. Surgery planned for Sunday AM with Dr.Hewitt. NPO after MN Saturday night. Maintain splint. NWB RLE. Elevate "toes above nose" using blankets/pillows  HTN: Manage per hospitalist recommendations   [name], [place] [phone]    08/01/2020 7:47 AM

## 2020-08-01 NOTE — ED Provider Notes (Signed)
.  Sedation  Date/Time: 07/31/2020 8:00 PM Performed by: Lajean Saver, MD Authorized by: Lajean Saver, MD   Consent:    Consent given by:  Patient Universal protocol:    Immediately prior to procedure, a time out was called: yes     Patient identity confirmed:  Arm band, verbally with patient and provided demographic data Indications:    Procedure performed:  Dislocation reduction   Procedure necessitating sedation performed by:  Physician performing sedation Pre-sedation assessment:    Time since last food or drink:  3   ASA classification: class 2 - patient with mild systemic disease     Mallampati score:  II - soft palate, uvula, fauces visible   Pre-sedation assessments completed and reviewed: airway patency, cardiovascular function, hydration status, mental status, nausea/vomiting, pain level and respiratory function     Pre-sedation assessment completed:  07/31/2020 8:00 PM Immediate pre-procedure details:    Reassessment: Patient reassessed immediately prior to procedure     Reviewed: vital signs, relevant labs/tests and NPO status     Verified: bag valve mask available, emergency equipment available, intubation equipment available, IV patency confirmed, oxygen available, reversal medications available and suction available   Procedure details (see MAR for exact dosages):    Preoxygenation:  Nasal cannula   Sedation:  Propofol   Intended level of sedation: deep   Analgesia:  Fentanyl   Intra-procedure monitoring:  Blood pressure monitoring, cardiac monitor, continuous capnometry, continuous pulse oximetry, frequent vital sign checks and frequent LOC assessments   Intra-procedure events: none     Total Provider sedation time (minutes):  25 Post-procedure details:    Post-sedation assessment completed:  07/31/2020 9:00 PM   Attendance: Constant attendance by certified staff until patient recovered     Recovery: Patient returned to pre-procedure baseline     Post-sedation  assessments completed and reviewed: airway patency, cardiovascular function, hydration status, mental status, nausea/vomiting, pain level, respiratory function and temperature     Patient is stable for discharge or admission: yes     Procedure completion:  Tolerated well, no immediate complications    Lajean Saver, MD 08/01/20 1644

## 2020-08-01 NOTE — Progress Notes (Addendum)
PROGRESS NOTE    Tonya Bartlett  BJS:[id number] DOB: 1945-12-04 DOA: 07/31/2020 PCP: [name], NP   Brief Narrative: 75 year old with past medical history significant for GERD, diverticular disease, depression, ADD, migraine headaches, irritable bowel syndrome, hyperlipidemia, essential hypertension, previous history of total hip replacement who presents after mechanical fall with twisting of her right ankle.  Evaluation in the ED x-ray of the right ankle show 3 malleoli fracture with dislocation.  CT head without contrast was negative.  CT cervical spine negative. Patient was admitted for surgical management.   Assessment & Plan:   Principal Problem:   Trimalleolar fracture Active Problems:   Hyperlipidemia   Essential hypertension, benign   S/P left THA, AA   Obese  1-Trimalleolar Fracture: S/p mechanical fall:  -She underwent closed reduction and splinting in the ED -CT: post reduction; Residual displacement of trimalleolar fracture as described. The tibiotalar dislocation has been reduced, although there is mild residual widening of the ankle mortise and tibiotalar joint space widening anteromedially with interposed small fracture fragments.  No evidence of tarsal bone fracture. Moderate midfoot degenerative changes. -On dilaudid, decrease to 0.5 to avoid oversedation. Started oral Vicodin PRN.  -For surgery on Sunday.   2-HTN;  Continue with losartan.   3-HLD; Continue with statins.   4-Morbid Obesity:  Needs life style modification.   5-Hypokalemia; replete orally.    Estimated body mass index is 36.69 kg/m as calculated from the following:   Height as of this encounter: 5\' 5"  (1.651 m).   Weight as of this encounter: 100 kg.   DVT prophylaxis: Lovenox  Code Status: Full code Family Communication: care discussed with patient Disposition Plan:  Status is: Inpatient  Remains inpatient appropriate because:IV treatments appropriate due to intensity of  illness or inability to take PO  Dispo: The patient is from: Home              Anticipated d/c is to:  Might need SNF              Patient currently is not medically stable to d/c.   Difficult to place patient No        Consultants:  Ortho  Procedures:  none  Antimicrobials:    Subjective: She is sleepy, just received dilaudid. Having pain ankle.  She is able to walk 2 miles without chest pain. Mild dyspnea.   Objective: Vitals:   07/31/20 2245 08/01/20 0021 08/01/20 0524 08/01/20 0646  BP: (!) 184/83 (!) 159/68 (!) 173/76 139/61  Pulse: 70 75 81   Resp: 17 20 17    Temp:  98.8 F (37.1 C) 98.1 F (36.7 C)   TempSrc:  Oral Oral   SpO2: 100% 98% 99%   Weight:  100 kg    Height:  5\' 5"  (1.651 m)      Intake/Output Summary (Last 24 hours) at 08/01/2020 0743 Last data filed at 08/01/2020 0700 Gross per 24 hour  Intake 927.47 ml  Output 50 ml  Net 877.47 ml   Filed Weights   08/01/20 0021  Weight: 100 kg    Examination:  General exam: Appears calm and comfortable  Respiratory system: Clear to auscultation. Respiratory effort normal. Cardiovascular system: S1 & S2 heard, RRR.  Gastrointestinal system: Abdomen is nondistended, soft and nontender. No organomegaly or masses felt. Normal bowel sounds heard. Central nervous system: Alert and oriented. No focal neurological deficits. Extremities: RLE on splint.     Data Reviewed: I have personally reviewed following labs and imaging studies  CBC: Recent Labs  Lab 07/31/20 1706 08/01/20 0105  WBC 8.2 9.6  NEUTROABS 6.1  --   HGB 12.0 11.9*  HCT 35.6* 35.4*  MCV 99.2 100.3*  PLT 242 599   Basic Metabolic Panel: Recent Labs  Lab 07/31/20 1706 08/01/20 0105  NA 138 136  K 3.5 3.4*  CL 107 104  CO2 25 25  GLUCOSE 109* 113*  BUN 13 13  CREATININE 0.60 0.55  0.48  CALCIUM 9.0 8.9   GFR: Estimated Creatinine Clearance: 71.2 mL/min (by C-G formula based on SCr of 0.55 mg/dL). Liver Function  Tests: Recent Labs  Lab 07/31/20 1706 08/01/20 0105  AST 23 22  ALT 24 22  ALKPHOS 60 61  BILITOT 0.6 0.8  PROT 6.8 6.7  ALBUMIN 3.8 4.0   No results for input(s): LIPASE, AMYLASE in the last 168 hours. No results for input(s): AMMONIA in the last 168 hours. Coagulation Profile: No results for input(s): INR, PROTIME in the last 168 hours. Cardiac Enzymes: No results for input(s): CKTOTAL, CKMB, CKMBINDEX, TROPONINI in the last 168 hours. BNP (last 3 results) No results for input(s): PROBNP in the last 8760 hours. HbA1C: No results for input(s): HGBA1C in the last 72 hours. CBG: No results for input(s): GLUCAP in the last 168 hours. Lipid Profile: No results for input(s): CHOL, HDL, LDLCALC, TRIG, CHOLHDL, LDLDIRECT in the last 72 hours. Thyroid Function Tests: No results for input(s): TSH, T4TOTAL, FREET4, T3FREE, THYROIDAB in the last 72 hours. Anemia Panel: No results for input(s): VITAMINB12, FOLATE, FERRITIN, TIBC, IRON, RETICCTPCT in the last 72 hours. Sepsis Labs: No results for input(s): PROCALCITON, LATICACIDVEN in the last 168 hours.  Recent Results (from the past 240 hour(s))  Resp Panel by RT-PCR (Flu A&B, Covid) Nasopharyngeal Swab     Status: None   Collection Time: 07/31/20  7:44 PM   Specimen: Nasopharyngeal Swab; Nasopharyngeal(NP) swabs in vial transport medium  Result Value Ref Range Status   SARS Coronavirus 2 by RT PCR NEGATIVE NEGATIVE Final    Comment: (NOTE) SARS-CoV-2 target nucleic acids are NOT DETECTED.  The SARS-CoV-2 RNA is generally detectable in upper respiratory specimens during the acute phase of infection. The lowest concentration of SARS-CoV-2 viral copies this assay can detect is 138 copies/mL. A negative result does not preclude SARS-Cov-2 infection and should not be used as the sole basis for treatment or other patient management decisions. A negative result may occur with  improper specimen collection/handling, submission of  specimen other than nasopharyngeal swab, presence of viral mutation(s) within the areas targeted by this assay, and inadequate number of viral copies(<138 copies/mL). A negative result must be combined with clinical observations, patient history, and epidemiological information. The expected result is Negative.  Fact Sheet for Patients:  EntrepreneurPulse.com.au  Fact Sheet for Healthcare Providers:  IncredibleEmployment.be  This test is no t yet approved or cleared by the [place] FDA and  has been authorized for detection and/or diagnosis of SARS-CoV-2 by FDA under an Emergency Use Authorization (EUA). This EUA will remain  in effect (meaning this test can be used) for the duration of the COVID-19 declaration under Section 564(b)(1) of the Act, 21 U.S.C.section 360bbb-3(b)(1), unless the authorization is terminated  or revoked sooner.       Influenza A by PCR NEGATIVE NEGATIVE Final   Influenza B by PCR NEGATIVE NEGATIVE Final    Comment: (NOTE) The Xpert Xpress SARS-CoV-2/FLU/RSV plus assay is intended as an aid in the diagnosis of influenza from Nasopharyngeal  swab specimens and should not be used as a sole basis for treatment. Nasal washings and aspirates are unacceptable for Xpert Xpress SARS-CoV-2/FLU/RSV testing.  Fact Sheet for Patients: EntrepreneurPulse.com.au  Fact Sheet for Healthcare Providers: IncredibleEmployment.be  This test is not yet approved or cleared by the [place] FDA and has been authorized for detection and/or diagnosis of SARS-CoV-2 by FDA under an Emergency Use Authorization (EUA). This EUA will remain in effect (meaning this test can be used) for the duration of the COVID-19 declaration under Section 564(b)(1) of the Act, 21 U.S.C. section 360bbb-3(b)(1), unless the authorization is terminated or revoked.  Performed at [facility], [place]  [address], [place], [place] [address]          Radiology Studies: DG Ankle Complete Right  Result Date: 07/31/2020 CLINICAL DATA:  Right ankle pain after a fall without obvious deformity. EXAM: RIGHT ANKLE - COMPLETE 3+ VIEW COMPARISON:  None. FINDINGS: Trimalleolar fracture dislocation of the right ankle. There is complete posterior dislocation of the talus with respect to the tibia. Coronal fracture of the posterior malleolus with posterior displacement of the fracture fragments. Oblique fracture of the distal fibula with lateral angulation and mild displacement of the fracture fragments. Comminuted fractures across the base of the medial malleolus extending into the tibial metaphysis with lateral displacement and angulation of fracture fragments. Old screw fixation of the first tarsometatarsal joint. IMPRESSION: Trimalleolar fracture dislocation of the right ankle. Electronically Signed   By: Lucienne Capers M.D.   On: 07/31/2020 18:09   CT Head Wo Contrast  Result Date: 07/31/2020 CLINICAL DATA:  75 year old female with head trauma. EXAM: CT HEAD WITHOUT CONTRAST CT CERVICAL SPINE WITHOUT CONTRAST TECHNIQUE: Multidetector CT imaging of the head and cervical spine was performed following the standard protocol without intravenous contrast. Multiplanar CT image reconstructions of the cervical spine were also generated. COMPARISON:  None. FINDINGS: CT HEAD FINDINGS Brain: Mild age-related atrophy and chronic microvascular ischemic changes. There is no acute intracranial hemorrhage. No mass effect or midline shift. No extra-axial fluid collection. Vascular: No hyperdense vessel or unexpected calcification. Skull: Normal. Negative for fracture or focal lesion. Sinuses/Orbits: No acute finding. Other: None CT CERVICAL SPINE FINDINGS Alignment: No acute subluxation. Skull base and vertebrae: No acute fracture. Osteopenia Soft tissues and spinal canal: No prevertebral fluid or swelling. No visible canal  hematoma. Disc levels: Degenerative changes with disc desiccation and vacuum phenomena at C4-C5. Upper chest: Negative. Other: None IMPRESSION: 1. No acute intracranial pathology. Mild age-related atrophy and chronic microvascular ischemic changes. 2. No acute/traumatic cervical spine pathology. Electronically Signed   By: Anner Crete M.D.   On: 07/31/2020 18:41   CT Cervical Spine Wo Contrast  Result Date: 07/31/2020 CLINICAL DATA:  75 year old female with head trauma. EXAM: CT HEAD WITHOUT CONTRAST CT CERVICAL SPINE WITHOUT CONTRAST TECHNIQUE: Multidetector CT imaging of the head and cervical spine was performed following the standard protocol without intravenous contrast. Multiplanar CT image reconstructions of the cervical spine were also generated. COMPARISON:  None. FINDINGS: CT HEAD FINDINGS Brain: Mild age-related atrophy and chronic microvascular ischemic changes. There is no acute intracranial hemorrhage. No mass effect or midline shift. No extra-axial fluid collection. Vascular: No hyperdense vessel or unexpected calcification. Skull: Normal. Negative for fracture or focal lesion. Sinuses/Orbits: No acute finding. Other: None CT CERVICAL SPINE FINDINGS Alignment: No acute subluxation. Skull base and vertebrae: No acute fracture. Osteopenia Soft tissues and spinal canal: No prevertebral fluid or swelling. No visible canal hematoma. Disc levels:  Degenerative changes with disc desiccation and vacuum phenomena at C4-C5. Upper chest: Negative. Other: None IMPRESSION: 1. No acute intracranial pathology. Mild age-related atrophy and chronic microvascular ischemic changes. 2. No acute/traumatic cervical spine pathology. Electronically Signed   By: Anner Crete M.D.   On: 07/31/2020 18:41   CT Ankle Right Wo Contrast  Result Date: 07/31/2020 CLINICAL DATA:  Trimalleolar right ankle fracture post reduction. EXAM: CT OF THE RIGHT ANKLE WITHOUT CONTRAST TECHNIQUE: Multidetector CT imaging of the  right ankle was performed according to the standard protocol. Multiplanar CT image reconstructions were also generated. COMPARISON:  Radiographs 07/31/2020. FINDINGS: Bones/Joint/Cartilage The ankle is splinted. The previously demonstrated posterior tibiotalar dislocation has been reduced. Mildly comminuted and displaced fracture of the posterior tibial plafond demonstrates 3 mm of impaction of the articular surface posteriorly. This component of the fracture extends into the distal tibiofibular joint which is mildly widened. There is a mildly displaced and mildly comminuted fracture through the base of the medial malleolus which extends to the articular surface of the tibial plafond anteriorly. There is mild residual anteromedial widening of the tibiotalar joint with interposed small fracture fragments and a small ankle joint effusion. There is a comminuted and mildly displaced fracture of the distal fibular shaft which is located 1.8 cm proximal to the ankle mortise. This fracture demonstrates up to 7 mm of residual lateral displacement. The talar dome and additional visualized tarsal bones appear intact. There are postsurgical changes related to previous 1st tarsometatarsal arthrodesis. Moderately advanced midfoot degenerative changes are noted. Ligaments Suboptimally assessed by CT. Muscles and Tendons The ankle tendons appear grossly intact without entrapment in the fractures. Soft tissues Moderate soft tissue swelling surrounding the fractures in the distal lower leg. No evidence of foreign body, soft tissue emphysema or significant hematoma. IMPRESSION: 1. Residual displacement of trimalleolar fracture as described. The tibiotalar dislocation has been reduced, although there is mild residual widening of the ankle mortise and tibiotalar joint space widening anteromedially with interposed small fracture fragments. 2. No evidence of tarsal bone fracture. 3. Moderate midfoot degenerative changes. 4. No gross  tendon rupture or entrapment identified. Electronically Signed   By: Richardean Sale M.D.   On: 07/31/2020 21:47   DG Ankle Right Port  Result Date: 07/31/2020 CLINICAL DATA:  Post reduction EXAM: PORTABLE RIGHT ANKLE - 2 VIEW COMPARISON:  07/31/2020 FINDINGS: Casting material limits bone detail. Redemonstrated trimalleolar fracture. Comminuted distal fibular fracture with decreased angulation, but with residual 1/2 shaft diameter posterior and 1/4 shaft diameter lateral displacement of largest fracture fragment. Comminuted medial malleolar fracture with overall decreased displacement. Displaced posterior malleolar fracture fragment. Reduction of previously noted ankle dislocation but with residual mild posterior and lateral subluxation of the talar dome with respect to the distal tibia. Residual asymmetric widening of the superomedial mortise and anterior ankle joint. Fixating hardware at the base of the great toe. IMPRESSION: 1. Trimalleolar fracture with overall decreased fracture displacement compared to prior. Reduction of ankle dislocation though with residual mild lateral and posterior subluxation of the talar dome with respect to the distal tibia Electronically Signed   By: Donavan Foil M.D.   On: 07/31/2020 21:04        Scheduled Meds:  betamethasone dipropionate   Topical BID   cholecalciferol  5,000 Units Oral QPM   enoxaparin (LOVENOX) injection  40 mg Subcutaneous Q24H   escitalopram  15 mg Oral Daily   losartan  50 mg Oral Daily   [START ON 08/06/2020] methotrexate  15 mg  Oral Weekly   potassium chloride  40 mEq Oral Once   sulfamethoxazole-trimethoprim  1 tablet Oral Daily   Continuous Infusions:  sodium chloride 150 mL/hr at 08/01/20 0533     LOS: 1 day    Time spent: 35 minutes.     Elmarie Shiley, MD [facility]   If 7PM-7AM, please contact night-coverage [email or web address]  08/01/2020, 7:43 AM

## 2020-08-01 NOTE — Consult Note (Signed)
Reason for Consult:right ankle pain Referring Physician: Dr. Otila Kluver Kleist is an 75 y.o. female.  HPI: 75 y/o female tripped stepping down from a curb yesterday.  She twisted her right ankle and had immediate pain.  She presented to the ER where xrays revealed a trimal fracture.  She underwent closed reduction and splinting and was admitted by the hospitalist service.  She lives alone.  No h/o diabetes or smoking.  Previous bunion surgery on the R by Dr. Beola Cord in the remote past.  No h/o ankle surgery.  She c/o aching pain in the right ankle that is worse with motion and better with rest.    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   History of hiatal hernia    History of iron deficiency anemia 1996   iron infusion   History of migraine headaches    History of rib fracture    Hyperlipidemia    IBS (irritable bowel syndrome)    Insomnia    MCI (mild cognitive impairment)    Melanoma ([place]) 2019   left leg    Migraine headache    hx of migraines    OA (osteoarthritis)    RIGHT SHOULDER   Pneumonia    remote history   PONV (postoperative nausea and vomiting)    Recovering alcoholic ([place])    SINCE [id number]   Right rotator cuff tear    Unspecified essential hypertension     Past Surgical History:  Procedure Laterality Date   BUNIONECTOMY/  HAMMERTOE CORRECTION  RIGHT FOOT  2011   CATARACT EXTRACTION W/ INTRAOCULAR LENS  IMPLANT, BILATERAL     COLONOSCOPY     KNEE ARTHROSCOPY W/ MENISCECTOMY Bilateral X2  LEFT /    X1  RIGHT   KNEE OPEN LATERAL RELEASE Bilateral    MOHS SURGERY Left 12/15/2017   Melanoma in situ - left calf - [facility]   REPLACEMENT TOTAL KNEE  Left 2006   SHOULDER ARTHROSCOPY WITH SUBACROMIAL DECOMPRESSION, ROTATOR CUFF REPAIR AND BICEP TENDON REPAIR Right 05/23/2013   Procedure: RIGHT SHOULDER ARTHROSCOPY EXAM UNDER ANESTHESIA  WITH SUBACROMIAL DECOMPRESSION,DISTAL CLAVICLE RESECTION, SADLABRAL DEBRIDEMENT CHONDROPLASTY, BICEP TENOTOMY ;  Surgeon: Sydnee Cabal, MD;  Location: [facility];  Service: Orthopedics;  Laterality: Right;   TONSILLECTOMY AND ADENOIDECTOMY  AGE 86   TOTAL HIP ARTHROPLASTY Left 05/04/2017   Procedure: LEFT TOTAL HIP ARTHROPLASTY ANTERIOR APPROACH;  Surgeon: Paralee Cancel, MD;  Location: [facility];  Service: Orthopedics;  Laterality: Left;   TOTAL HIP ARTHROPLASTY Right 08/01/2019   Procedure: TOTAL HIP ARTHROPLASTY ANTERIOR APPROACH;  Surgeon: Paralee Cancel, MD;  Location: [facility];  Service: Orthopedics;  Laterality: Right;  56mins   TOTAL HIP ARTHROPLASTY Right 08/01/2019   TOTAL KNEE ARTHROPLASTY Bilateral LEFT  1996/   RIGHT 2004   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 Her smoking use included cigarettes. She has a 7.50 pack-year smoking history. She has never used smokeless tobacco. She reports that she does not drink alcohol and does not use drugs.  Allergies:  Allergies  Allergen Reactions   Otezla [Apremilast] Anaphylaxis    Suicidal ideation   Penicillins Anaphylaxis    Has patient had a PCN reaction causing immediate rash, facial/tongue/throat swelling, SOB or lightheadedness with hypotension: Yes Has patient had a PCN reaction causing severe rash  involving mucus membranes or skin necrosis: Yes Has patient had a PCN reaction that required hospitalization: No Has patient had a PCN reaction occurring within the last 10 year No If all of the above answers are "NO", then may proceed with Cephalosporin use.    Erythromycin Other (See Comments)    SEVERE STOMACH CRAMPS   Morphine And Related Nausea And Vomiting   Nsaids Other (See Comments)    SEVERE STOMACH CRAMPS, MOUTH SORES **Able to tolerate Tylenol   Remicade [Infliximab] Other (See Comments)    Shut down immune system-BP high   Tolmetin     SEVERE STOMACH CRAMPS   Nickel Rash    Including snaps on hospital gowns     Medications: I have reviewed the patient's current medications.  Results for orders placed or performed during the hospital encounter of 07/31/20 (from the past 48 hour(s))  Comprehensive metabolic panel     Status: Abnormal   Collection Time: 07/31/20  5:06 PM  Result Value Ref Range   Sodium 138 135 - 145 mmol/L   Potassium 3.5 3.5 - 5.1 mmol/L   Chloride 107 98 - 111 mmol/L   CO2 25 22 - 32 mmol/L   Glucose, Bld 109 (H) 70 - 99 mg/dL    Comment: Glucose reference range applies only to samples taken after fasting for at least 8 hours.   BUN 13 8 - 23 mg/dL   Creatinine, Ser 0.60 0.44 - 1.00 mg/dL   Calcium 9.0 8.9 - 10.3 mg/dL   Total Protein 6.8 6.5 - 8.1 g/dL   Albumin 3.8 3.5 - 5.0 g/dL   AST 23 15 - 41 U/L   ALT 24 0 - 44 U/L   Alkaline Phosphatase 60 38 - 126 U/L   Total Bilirubin 0.6 0.3 - 1.2 mg/dL   GFR, Estimated >60 >60 mL/min    Comment: (NOTE) Calculated using the CKD-EPI Creatinine Equation (2021)    Anion gap 6 5 - 15    Comment: Performed at [facility], [place] [address] [place], [place] [address]  CBC with Differential     Status: Abnormal   Collection Time: 07/31/20  5:06 PM  Result Value Ref Range   WBC 8.2 4.0 - 10.5 K/uL   RBC 3.59 (L) 3.87 - 5.11 MIL/uL   Hemoglobin 12.0 12.0 - 15.0 g/dL   HCT 35.6 (L)  36.0 - 46.0 %   MCV 99.2 80.0 - 100.0 fL   MCH 33.4 26.0 - 34.0 pg   MCHC 33.7 30.0 - 36.0 g/dL   RDW 12.5 11.5 - 15.5 %   Platelets 242 150 -  400 K/uL   nRBC 0.0 0.0 - 0.2 %   Neutrophils Relative % 74 %   Neutro Abs 6.1 1.7 - 7.7 K/uL   Lymphocytes Relative 15 %   Lymphs Abs 1.3 0.7 - 4.0 K/uL   Monocytes Relative 9 %   Monocytes Absolute 0.7 0.1 - 1.0 K/uL   Eosinophils Relative 1 %   Eosinophils Absolute 0.1 0.0 - 0.5 K/uL   Basophils Relative 1 %   Basophils Absolute 0.1 0.0 - 0.1 K/uL   Immature Granulocytes 0 %   Abs Immature Granulocytes 0.02 0.00 - 0.07 K/uL    Comment: Performed at [facility], [place] [address], [place], [place] [address]  Resp Panel by RT-PCR (Flu A&B, Covid) Nasopharyngeal Swab     Status: None   Collection Time: 07/31/20  7:44 PM   Specimen: Nasopharyngeal Swab; Nasopharyngeal(NP) swabs in vial transport medium  Result Value Ref Range   SARS Coronavirus 2 by RT PCR NEGATIVE NEGATIVE    Comment: (NOTE) SARS-CoV-2 target nucleic acids are NOT DETECTED.  The SARS-CoV-2 RNA is generally detectable in upper respiratory specimens during the acute phase of infection. The lowest concentration of SARS-CoV-2 viral copies this assay can detect is 138 copies/mL. A negative result does not preclude SARS-Cov-2 infection and should not be used as the sole basis for treatment or other patient management decisions. A negative result may occur with  improper specimen collection/handling, submission of specimen other than nasopharyngeal swab, presence of viral mutation(s) within the areas targeted by this assay, and inadequate number of viral copies(<138 copies/mL). A negative result must be combined with clinical observations, patient history, and epidemiological information. The expected result is Negative.  Fact Sheet for Patients:  EntrepreneurPulse.com.au  Fact Sheet for Healthcare Providers:   IncredibleEmployment.be  This test is no t yet approved or cleared by the [place] FDA and  has been authorized for detection and/or diagnosis of SARS-CoV-2 by FDA under an Emergency Use Authorization (EUA). This EUA will remain  in effect (meaning this test can be used) for the duration of the COVID-19 declaration under Section 564(b)(1) of the Act, 21 U.S.C.section 360bbb-3(b)(1), unless the authorization is terminated  or revoked sooner.       Influenza A by PCR NEGATIVE NEGATIVE   Influenza B by PCR NEGATIVE NEGATIVE    Comment: (NOTE) The Xpert Xpress SARS-CoV-2/FLU/RSV plus assay is intended as an aid in the diagnosis of influenza from Nasopharyngeal swab specimens and should not be used as a sole basis for treatment. Nasal washings and aspirates are unacceptable for Xpert Xpress SARS-CoV-2/FLU/RSV testing.  Fact Sheet for Patients: EntrepreneurPulse.com.au  Fact Sheet for Healthcare Providers: IncredibleEmployment.be  This test is not yet approved or cleared by the [place] FDA and has been authorized for detection and/or diagnosis of SARS-CoV-2 by FDA under an Emergency Use Authorization (EUA). This EUA will remain in effect (meaning this test can be used) for the duration of the COVID-19 declaration under Section 564(b)(1) of the Act, 21 U.S.C. section 360bbb-3(b)(1), unless the authorization is terminated or revoked.  Performed at [facility], [place] [address] [place], [place] [address]   Creatinine, serum     Status: None   Collection Time: 08/01/20  1:05 AM  Result Value Ref Range   Creatinine, Ser 0.55 0.44 - 1.00 mg/dL   GFR, Estimated >60 >60 mL/min    Comment: (NOTE) Calculated using the CKD-EPI Creatinine Equation (2021) Performed at [facility], [place] [address]  [address] [place], [place] [address]   Comprehensive metabolic panel     Status: Abnormal    Collection Time: 08/01/20  1:05 AM  Result Value Ref Range   Sodium 136 135 - 145 mmol/L   Potassium 3.4 (L) 3.5 - 5.1 mmol/L   Chloride 104 98 - 111 mmol/L   CO2 25 22 - 32 mmol/L   Glucose, Bld 113 (H) 70 - 99 mg/dL    Comment: Glucose reference range applies only to samples taken after fasting for at least 8 hours.   BUN 13 8 - 23 mg/dL   Creatinine, Ser 0.48 0.44 - 1.00 mg/dL   Calcium 8.9 8.9 - 10.3 mg/dL   Total Protein 6.7 6.5 - 8.1 g/dL   Albumin 4.0 3.5 - 5.0 g/dL   AST 22 15 - 41 U/L   ALT 22 0 - 44 U/L   Alkaline Phosphatase 61 38 - 126 U/L   Total Bilirubin 0.8 0.3 - 1.2 mg/dL   GFR, Estimated >60 >60 mL/min    Comment: (NOTE) Calculated using the CKD-EPI Creatinine Equation (2021)    Anion gap 7 5 - 15    Comment: Performed at [facility], [place] [address] [place], [place] [address]  CBC     Status: Abnormal   Collection Time: 08/01/20  1:05 AM  Result Value Ref Range   WBC 9.6 4.0 - 10.5 K/uL   RBC 3.53 (L) 3.87 - 5.11 MIL/uL   Hemoglobin 11.9 (L) 12.0 - 15.0 g/dL   HCT 35.4 (L) 36.0 - 46.0 %   MCV 100.3 (H) 80.0 - 100.0 fL   MCH 33.7 26.0 - 34.0 pg   MCHC 33.6 30.0 - 36.0 g/dL   RDW 12.8 11.5 - 15.5 %   Platelets 240 150 - 400 K/uL   nRBC 0.0 0.0 - 0.2 %    Comment: Performed at [facility], [place] [address] [place], [place] [address]    DG Ankle Complete Right  Result Date: 07/31/2020 CLINICAL DATA:  Right ankle pain after a fall without obvious deformity. EXAM: RIGHT ANKLE - COMPLETE 3+ VIEW COMPARISON:  None. FINDINGS: Trimalleolar fracture dislocation of the right ankle. There is complete posterior dislocation of the talus with respect to the tibia. Coronal fracture of the posterior malleolus with posterior displacement of the fracture fragments. Oblique fracture of the distal fibula with lateral angulation and mild displacement of the fracture fragments. Comminuted fractures across the base of the medial malleolus  extending into the tibial metaphysis with lateral displacement and angulation of fracture fragments. Old screw fixation of the first tarsometatarsal joint. IMPRESSION: Trimalleolar fracture dislocation of the right ankle. Electronically Signed   By: Lucienne Capers M.D.   On: 07/31/2020 18:09   CT Head Wo Contrast  Result Date: 07/31/2020 CLINICAL DATA:  75 year old female with head trauma. EXAM: CT HEAD WITHOUT CONTRAST CT CERVICAL SPINE WITHOUT CONTRAST TECHNIQUE: Multidetector CT imaging of the head and cervical spine was performed following the standard protocol without intravenous contrast. Multiplanar CT image reconstructions of the cervical spine were also generated. COMPARISON:  None. FINDINGS: CT HEAD FINDINGS Brain: Mild age-related atrophy and chronic microvascular ischemic changes. There is no acute intracranial hemorrhage. No mass effect or midline shift. No extra-axial fluid collection. Vascular: No hyperdense vessel or unexpected calcification. Skull: Normal. Negative for fracture or focal lesion. Sinuses/Orbits: No acute finding. Other: None CT CERVICAL SPINE FINDINGS Alignment: No acute subluxation. Skull base and vertebrae: No acute fracture. Osteopenia Soft tissues and  spinal canal: No prevertebral fluid or swelling. No visible canal hematoma. Disc levels: Degenerative changes with disc desiccation and vacuum phenomena at C4-C5. Upper chest: Negative. Other: None IMPRESSION: 1. No acute intracranial pathology. Mild age-related atrophy and chronic microvascular ischemic changes. 2. No acute/traumatic cervical spine pathology. Electronically Signed   By: Anner Crete M.D.   On: 07/31/2020 18:41   CT Cervical Spine Wo Contrast  Result Date: 07/31/2020 CLINICAL DATA:  75 year old female with head trauma. EXAM: CT HEAD WITHOUT CONTRAST CT CERVICAL SPINE WITHOUT CONTRAST TECHNIQUE: Multidetector CT imaging of the head and cervical spine was performed following the standard protocol without  intravenous contrast. Multiplanar CT image reconstructions of the cervical spine were also generated. COMPARISON:  None. FINDINGS: CT HEAD FINDINGS Brain: Mild age-related atrophy and chronic microvascular ischemic changes. There is no acute intracranial hemorrhage. No mass effect or midline shift. No extra-axial fluid collection. Vascular: No hyperdense vessel or unexpected calcification. Skull: Normal. Negative for fracture or focal lesion. Sinuses/Orbits: No acute finding. Other: None CT CERVICAL SPINE FINDINGS Alignment: No acute subluxation. Skull base and vertebrae: No acute fracture. Osteopenia Soft tissues and spinal canal: No prevertebral fluid or swelling. No visible canal hematoma. Disc levels: Degenerative changes with disc desiccation and vacuum phenomena at C4-C5. Upper chest: Negative. Other: None IMPRESSION: 1. No acute intracranial pathology. Mild age-related atrophy and chronic microvascular ischemic changes. 2. No acute/traumatic cervical spine pathology. Electronically Signed   By: Anner Crete M.D.   On: 07/31/2020 18:41   CT Ankle Right Wo Contrast  Result Date: 07/31/2020 CLINICAL DATA:  Trimalleolar right ankle fracture post reduction. EXAM: CT OF THE RIGHT ANKLE WITHOUT CONTRAST TECHNIQUE: Multidetector CT imaging of the right ankle was performed according to the standard protocol. Multiplanar CT image reconstructions were also generated. COMPARISON:  Radiographs 07/31/2020. FINDINGS: Bones/Joint/Cartilage The ankle is splinted. The previously demonstrated posterior tibiotalar dislocation has been reduced. Mildly comminuted and displaced fracture of the posterior tibial plafond demonstrates 3 mm of impaction of the articular surface posteriorly. This component of the fracture extends into the distal tibiofibular joint which is mildly widened. There is a mildly displaced and mildly comminuted fracture through the base of the medial malleolus which extends to the articular surface of  the tibial plafond anteriorly. There is mild residual anteromedial widening of the tibiotalar joint with interposed small fracture fragments and a small ankle joint effusion. There is a comminuted and mildly displaced fracture of the distal fibular shaft which is located 1.8 cm proximal to the ankle mortise. This fracture demonstrates up to 7 mm of residual lateral displacement. The talar dome and additional visualized tarsal bones appear intact. There are postsurgical changes related to previous 1st tarsometatarsal arthrodesis. Moderately advanced midfoot degenerative changes are noted. Ligaments Suboptimally assessed by CT. Muscles and Tendons The ankle tendons appear grossly intact without entrapment in the fractures. Soft tissues Moderate soft tissue swelling surrounding the fractures in the distal lower leg. No evidence of foreign body, soft tissue emphysema or significant hematoma. IMPRESSION: 1. Residual displacement of trimalleolar fracture as described. The tibiotalar dislocation has been reduced, although there is mild residual widening of the ankle mortise and tibiotalar joint space widening anteromedially with interposed small fracture fragments. 2. No evidence of tarsal bone fracture. 3. Moderate midfoot degenerative changes. 4. No gross tendon rupture or entrapment identified. Electronically Signed   By: Richardean Sale M.D.   On: 07/31/2020 21:47   DG Ankle Right Port  Result Date: 07/31/2020 CLINICAL DATA:  Post reduction EXAM:  PORTABLE RIGHT ANKLE - 2 VIEW COMPARISON:  07/31/2020 FINDINGS: Casting material limits bone detail. Redemonstrated trimalleolar fracture. Comminuted distal fibular fracture with decreased angulation, but with residual 1/2 shaft diameter posterior and 1/4 shaft diameter lateral displacement of largest fracture fragment. Comminuted medial malleolar fracture with overall decreased displacement. Displaced posterior malleolar fracture fragment. Reduction of previously noted  ankle dislocation but with residual mild posterior and lateral subluxation of the talar dome with respect to the distal tibia. Residual asymmetric widening of the superomedial mortise and anterior ankle joint. Fixating hardware at the base of the great toe. IMPRESSION: 1. Trimalleolar fracture with overall decreased fracture displacement compared to prior. Reduction of ankle dislocation though with residual mild lateral and posterior subluxation of the talar dome with respect to the distal tibia Electronically Signed   By: Donavan Foil M.D.   On: 07/31/2020 21:04    ROS:  no recent f/c/n/v/wt loss.  10 system review is o/w negative. PE:  Blood pressure (!) 155/74, pulse 63, temperature 98.1 F (36.7 C), temperature source Oral, resp. rate 20, height 5\' 5"  (1.651 m), weight 100 kg, SpO2 96 %. 24 wd woman in nad. A and O x 4.  Normal mood and affect.  EOMII.  Resp unlabored.  R ankle splinted.  Toes with brisk cap refill.  Intact sens to LT dorsally and plantarly.  Active PF and DF strength at the toes.  No lymphadenopathy.  Assessment/Plan: R ankle trimal fracture - I explained the nature of the injury to the patient in detail.  She will need ORIF of the ankle.  Continue NWB.  PT, OT, discharge planning.  Plan OR for Sunday morning.  Pre op orders will be entered.  Hold blood thinners starting Saturday.  She understands the plan and agrees.  The risks and benefits of the alternative treatment options have been discussed in detail.  The patient wishes to proceed with surgery and specifically understands risks of bleeding, infection, nerve damage, blood clots, need for additional surgery, amputation and death.   Wylene Simmer 08-13-2020, 5:04 PM

## 2020-08-02 DIAGNOSIS — S82851A Displaced trimalleolar fracture of right lower leg, initial encounter for closed fracture: Secondary | ICD-10-CM | POA: Diagnosis not present

## 2020-08-02 LAB — BASIC METABOLIC PANEL
Anion gap: 6 (ref 5–15)
BUN: 7 mg/dL — ABNORMAL LOW (ref 8–23)
CO2: 28 mmol/L (ref 22–32)
Calcium: 8.7 mg/dL — ABNORMAL LOW (ref 8.9–10.3)
Chloride: 102 mmol/L (ref 98–111)
Creatinine, Ser: 0.64 mg/dL (ref 0.44–1.00)
GFR, Estimated: >60 mL/min (ref >60)
Glucose, Bld: 107 mg/dL — ABNORMAL HIGH (ref 70–99)
Potassium: 3.7 mmol/L (ref 3.5–5.1)
Sodium: 136 mmol/L (ref 135–145)

## 2020-08-02 MED ORDER — BACITRACIN-NEOMYCIN-POLYMYXIN OINTMENT TUBE
TOPICAL_OINTMENT | Freq: Two times a day (BID) | CUTANEOUS | Status: DC
  Administered 2020-08-05 (×2): 1 via TOPICAL
  Filled 2020-08-02: qty 28.34
  Filled 2020-08-02: qty 28
  Filled 2020-08-02: qty 28.34

## 2020-08-02 MED ORDER — POLYETHYLENE GLYCOL 3350 17 G PO PACK
17.0000 g | PACK | Freq: Two times a day (BID) | ORAL | Status: DC
  Administered 2020-08-02 – 2020-08-05 (×4): 17 g via ORAL
  Filled 2020-08-02 (×5): qty 1

## 2020-08-02 NOTE — Progress Notes (Signed)
PROGRESS NOTE    Tonya Bartlett  VFI:[id number] DOB: September 17, 1945 DOA: 07/31/2020 PCP: [name], NP   Brief Narrative: 75 year old with past medical history significant for GERD, diverticular disease, depression, ADD, migraine headaches, irritable bowel syndrome, hyperlipidemia, essential hypertension, previous history of total hip replacement who presents after mechanical fall with twisting of her right ankle.  Evaluation in the ED x-ray of the right ankle show 3 malleoli fracture with dislocation.  CT head without contrast was negative.  CT cervical spine negative. Patient was admitted for surgical management.   Assessment & Plan:   Principal Problem:   Trimalleolar fracture Active Problems:   Hyperlipidemia   Essential hypertension, benign   S/P left THA, AA   Obese  1-Trimalleolar Fracture: S/p mechanical fall:  -She underwent closed reduction and splinting in the ED -CT: post reduction; Residual displacement of trimalleolar fracture as described. The tibiotalar dislocation has been reduced, although there is mild residual widening of the ankle mortise and tibiotalar joint space widening anteromedially with interposed small fracture fragments.  No evidence of tarsal bone fracture. Moderate midfoot degenerative changes. -On dilaudid, decrease to 0.5 to avoid oversedation. Started oral Vicodin PRN.  -For surgery on Sunday.  -Miralax for bowel regimen.   2-HTN;  Continue with losartan.   3-HLD; Continue with statins.   4-Morbid Obesity:  Needs life style modification.   5-Hypokalemia; replete orally.  6-Head scalp laceration; staple in place. Will apply neosporin. Needs removed 7 to 10 days.   Estimated body mass index is 36.69 kg/m as calculated from the following:   Height as of this encounter: 5\' 5"  (1.651 m).   Weight as of this encounter: 100 kg.   DVT prophylaxis: Lovenox  Code Status: Full code Family Communication: care discussed with  patient Disposition Plan:  Status is: Inpatient  Remains inpatient appropriate because:IV treatments appropriate due to intensity of illness or inability to take PO  Dispo: The patient is from: Home              Anticipated d/c is to:  Might need SNF              Patient currently is not medically stable to d/c.   Difficult to place patient No        Consultants:  Ortho  Procedures:  none  Antimicrobials:    Subjective: Dilaudid helps more with pain than vicodin.  No BM yet.    Objective: Vitals:   08/01/20 1440 08/01/20 2308 08/02/20 0521 08/02/20 1424  BP: (!) 155/74 (!) 166/70 (!) 160/66 (!) 164/61  Pulse: 63 78 70 70  Resp: 20 17 18 14   Temp: 98.1 F (36.7 C) 99.6 F (37.6 C) 98.3 F (36.8 C) 98.8 F (37.1 C)  TempSrc: Oral Oral Oral Oral  SpO2: 96% 96% 97% 94%  Weight:      Height:        Intake/Output Summary (Last 24 hours) at 08/02/2020 1533 Last data filed at 08/02/2020 1500 Gross per 24 hour  Intake 2454.28 ml  Output 3750 ml  Net -1295.72 ml    Filed Weights   08/01/20 0021  Weight: 100 kg    Examination:  General exam: NAD Respiratory system: CTA Cardiovascular system: S 1. S 2 RRR Gastrointestinal system: BS present, soft.  nt Central nervous system: non focal.  Extremities: RLE on splint.     Data Reviewed: I have personally reviewed following labs and imaging studies  CBC: Recent Labs  Lab 07/31/20 1706 08/01/20 0105  WBC 8.2 9.6  NEUTROABS 6.1  --   HGB 12.0 11.9*  HCT 35.6* 35.4*  MCV 99.2 100.3*  PLT 242 431    Basic Metabolic Panel: Recent Labs  Lab 07/31/20 1706 08/01/20 0105 08/02/20 0946  NA 138 136 136  K 3.5 3.4* 3.7  CL 107 104 102  CO2 25 25 28   GLUCOSE 109* 113* 107*  BUN 13 13 7*  CREATININE 0.60 0.55  0.48 0.64  CALCIUM 9.0 8.9 8.7*    GFR: Estimated Creatinine Clearance: 71.2 mL/min (by C-G formula based on SCr of 0.64 mg/dL). Liver Function Tests: Recent Labs  Lab 07/31/20 1706  08/01/20 0105  AST 23 22  ALT 24 22  ALKPHOS 60 61  BILITOT 0.6 0.8  PROT 6.8 6.7  ALBUMIN 3.8 4.0    No results for input(s): LIPASE, AMYLASE in the last 168 hours. No results for input(s): AMMONIA in the last 168 hours. Coagulation Profile: No results for input(s): INR, PROTIME in the last 168 hours. Cardiac Enzymes: No results for input(s): CKTOTAL, CKMB, CKMBINDEX, TROPONINI in the last 168 hours. BNP (last 3 results) No results for input(s): PROBNP in the last 8760 hours. HbA1C: No results for input(s): HGBA1C in the last 72 hours. CBG: No results for input(s): GLUCAP in the last 168 hours. Lipid Profile: No results for input(s): CHOL, HDL, LDLCALC, TRIG, CHOLHDL, LDLDIRECT in the last 72 hours. Thyroid Function Tests: No results for input(s): TSH, T4TOTAL, FREET4, T3FREE, THYROIDAB in the last 72 hours. Anemia Panel: No results for input(s): VITAMINB12, FOLATE, FERRITIN, TIBC, IRON, RETICCTPCT in the last 72 hours. Sepsis Labs: No results for input(s): PROCALCITON, LATICACIDVEN in the last 168 hours.  Recent Results (from the past 240 hour(s))  Resp Panel by RT-PCR (Flu A&B, Covid) Nasopharyngeal Swab     Status: None   Collection Time: 07/31/20  7:44 PM   Specimen: Nasopharyngeal Swab; Nasopharyngeal(NP) swabs in vial transport medium  Result Value Ref Range Status   SARS Coronavirus 2 by RT PCR NEGATIVE NEGATIVE Final    Comment: (NOTE) SARS-CoV-2 target nucleic acids are NOT DETECTED.  The SARS-CoV-2 RNA is generally detectable in upper respiratory specimens during the acute phase of infection. The lowest concentration of SARS-CoV-2 viral copies this assay can detect is 138 copies/mL. A negative result does not preclude SARS-Cov-2 infection and should not be used as the sole basis for treatment or other patient management decisions. A negative result may occur with  improper specimen collection/handling, submission of specimen other than nasopharyngeal swab,  presence of viral mutation(s) within the areas targeted by this assay, and inadequate number of viral copies(<138 copies/mL). A negative result must be combined with clinical observations, patient history, and epidemiological information. The expected result is Negative.  Fact Sheet for Patients:  EntrepreneurPulse.com.au  Fact Sheet for Healthcare Providers:  IncredibleEmployment.be  This test is no t yet approved or cleared by the [place] FDA and  has been authorized for detection and/or diagnosis of SARS-CoV-2 by FDA under an Emergency Use Authorization (EUA). This EUA will remain  in effect (meaning this test can be used) for the duration of the COVID-19 declaration under Section 564(b)(1) of the Act, 21 U.S.C.section 360bbb-3(b)(1), unless the authorization is terminated  or revoked sooner.       Influenza A by PCR NEGATIVE NEGATIVE Final   Influenza B by PCR NEGATIVE NEGATIVE Final    Comment: (NOTE) The Xpert Xpress SARS-CoV-2/FLU/RSV plus assay is intended as an aid in the diagnosis of  influenza from Nasopharyngeal swab specimens and should not be used as a sole basis for treatment. Nasal washings and aspirates are unacceptable for Xpert Xpress SARS-CoV-2/FLU/RSV testing.  Fact Sheet for Patients: EntrepreneurPulse.com.au  Fact Sheet for Healthcare Providers: IncredibleEmployment.be  This test is not yet approved or cleared by the [place] FDA and has been authorized for detection and/or diagnosis of SARS-CoV-2 by FDA under an Emergency Use Authorization (EUA). This EUA will remain in effect (meaning this test can be used) for the duration of the COVID-19 declaration under Section 564(b)(1) of the Act, 21 U.S.C. section 360bbb-3(b)(1), unless the authorization is terminated or revoked.  Performed at [facility], [place] [address] [place], [place] [address]            Radiology Studies: DG Ankle Complete Right  Result Date: 07/31/2020 CLINICAL DATA:  Right ankle pain after a fall without obvious deformity. EXAM: RIGHT ANKLE - COMPLETE 3+ VIEW COMPARISON:  None. FINDINGS: Trimalleolar fracture dislocation of the right ankle. There is complete posterior dislocation of the talus with respect to the tibia. Coronal fracture of the posterior malleolus with posterior displacement of the fracture fragments. Oblique fracture of the distal fibula with lateral angulation and mild displacement of the fracture fragments. Comminuted fractures across the base of the medial malleolus extending into the tibial metaphysis with lateral displacement and angulation of fracture fragments. Old screw fixation of the first tarsometatarsal joint. IMPRESSION: Trimalleolar fracture dislocation of the right ankle. Electronically Signed   By: Lucienne Capers M.D.   On: 07/31/2020 18:09   CT Head Wo Contrast  Result Date: 07/31/2020 CLINICAL DATA:  75 year old female with head trauma. EXAM: CT HEAD WITHOUT CONTRAST CT CERVICAL SPINE WITHOUT CONTRAST TECHNIQUE: Multidetector CT imaging of the head and cervical spine was performed following the standard protocol without intravenous contrast. Multiplanar CT image reconstructions of the cervical spine were also generated. COMPARISON:  None. FINDINGS: CT HEAD FINDINGS Brain: Mild age-related atrophy and chronic microvascular ischemic changes. There is no acute intracranial hemorrhage. No mass effect or midline shift. No extra-axial fluid collection. Vascular: No hyperdense vessel or unexpected calcification. Skull: Normal. Negative for fracture or focal lesion. Sinuses/Orbits: No acute finding. Other: None CT CERVICAL SPINE FINDINGS Alignment: No acute subluxation. Skull base and vertebrae: No acute fracture. Osteopenia Soft tissues and spinal canal: No prevertebral fluid or swelling. No visible canal hematoma. Disc levels: Degenerative changes  with disc desiccation and vacuum phenomena at C4-C5. Upper chest: Negative. Other: None IMPRESSION: 1. No acute intracranial pathology. Mild age-related atrophy and chronic microvascular ischemic changes. 2. No acute/traumatic cervical spine pathology. Electronically Signed   By: Anner Crete M.D.   On: 07/31/2020 18:41   CT Cervical Spine Wo Contrast  Result Date: 07/31/2020 CLINICAL DATA:  75 year old female with head trauma. EXAM: CT HEAD WITHOUT CONTRAST CT CERVICAL SPINE WITHOUT CONTRAST TECHNIQUE: Multidetector CT imaging of the head and cervical spine was performed following the standard protocol without intravenous contrast. Multiplanar CT image reconstructions of the cervical spine were also generated. COMPARISON:  None. FINDINGS: CT HEAD FINDINGS Brain: Mild age-related atrophy and chronic microvascular ischemic changes. There is no acute intracranial hemorrhage. No mass effect or midline shift. No extra-axial fluid collection. Vascular: No hyperdense vessel or unexpected calcification. Skull: Normal. Negative for fracture or focal lesion. Sinuses/Orbits: No acute finding. Other: None CT CERVICAL SPINE FINDINGS Alignment: No acute subluxation. Skull base and vertebrae: No acute fracture. Osteopenia Soft tissues and spinal canal: No prevertebral fluid or swelling. No visible  canal hematoma. Disc levels: Degenerative changes with disc desiccation and vacuum phenomena at C4-C5. Upper chest: Negative. Other: None IMPRESSION: 1. No acute intracranial pathology. Mild age-related atrophy and chronic microvascular ischemic changes. 2. No acute/traumatic cervical spine pathology. Electronically Signed   By: Anner Crete M.D.   On: 07/31/2020 18:41   CT Ankle Right Wo Contrast  Result Date: 07/31/2020 CLINICAL DATA:  Trimalleolar right ankle fracture post reduction. EXAM: CT OF THE RIGHT ANKLE WITHOUT CONTRAST TECHNIQUE: Multidetector CT imaging of the right ankle was performed according to the  standard protocol. Multiplanar CT image reconstructions were also generated. COMPARISON:  Radiographs 07/31/2020. FINDINGS: Bones/Joint/Cartilage The ankle is splinted. The previously demonstrated posterior tibiotalar dislocation has been reduced. Mildly comminuted and displaced fracture of the posterior tibial plafond demonstrates 3 mm of impaction of the articular surface posteriorly. This component of the fracture extends into the distal tibiofibular joint which is mildly widened. There is a mildly displaced and mildly comminuted fracture through the base of the medial malleolus which extends to the articular surface of the tibial plafond anteriorly. There is mild residual anteromedial widening of the tibiotalar joint with interposed small fracture fragments and a small ankle joint effusion. There is a comminuted and mildly displaced fracture of the distal fibular shaft which is located 1.8 cm proximal to the ankle mortise. This fracture demonstrates up to 7 mm of residual lateral displacement. The talar dome and additional visualized tarsal bones appear intact. There are postsurgical changes related to previous 1st tarsometatarsal arthrodesis. Moderately advanced midfoot degenerative changes are noted. Ligaments Suboptimally assessed by CT. Muscles and Tendons The ankle tendons appear grossly intact without entrapment in the fractures. Soft tissues Moderate soft tissue swelling surrounding the fractures in the distal lower leg. No evidence of foreign body, soft tissue emphysema or significant hematoma. IMPRESSION: 1. Residual displacement of trimalleolar fracture as described. The tibiotalar dislocation has been reduced, although there is mild residual widening of the ankle mortise and tibiotalar joint space widening anteromedially with interposed small fracture fragments. 2. No evidence of tarsal bone fracture. 3. Moderate midfoot degenerative changes. 4. No gross tendon rupture or entrapment identified.  Electronically Signed   By: Richardean Sale M.D.   On: 07/31/2020 21:47   DG Ankle Right Port  Result Date: 07/31/2020 CLINICAL DATA:  Post reduction EXAM: PORTABLE RIGHT ANKLE - 2 VIEW COMPARISON:  07/31/2020 FINDINGS: Casting material limits bone detail. Redemonstrated trimalleolar fracture. Comminuted distal fibular fracture with decreased angulation, but with residual 1/2 shaft diameter posterior and 1/4 shaft diameter lateral displacement of largest fracture fragment. Comminuted medial malleolar fracture with overall decreased displacement. Displaced posterior malleolar fracture fragment. Reduction of previously noted ankle dislocation but with residual mild posterior and lateral subluxation of the talar dome with respect to the distal tibia. Residual asymmetric widening of the superomedial mortise and anterior ankle joint. Fixating hardware at the base of the great toe. IMPRESSION: 1. Trimalleolar fracture with overall decreased fracture displacement compared to prior. Reduction of ankle dislocation though with residual mild lateral and posterior subluxation of the talar dome with respect to the distal tibia Electronically Signed   By: Donavan Foil M.D.   On: 07/31/2020 21:04        Scheduled Meds:  betamethasone dipropionate   Topical BID   cholecalciferol  5,000 Units Oral QPM   enoxaparin (LOVENOX) injection  40 mg Subcutaneous Q24H   escitalopram  15 mg Oral Daily   losartan  50 mg Oral Daily   [START ON 08/06/2020]  methotrexate  15 mg Oral Weekly   neomycin-bacitracin-polymyxin   Topical BID   polyethylene glycol  17 g Oral BID   senna-docusate  1 tablet Oral BID   sulfamethoxazole-trimethoprim  1 tablet Oral Daily   Continuous Infusions:     LOS: 2 days    Time spent: 35 minutes.     Elmarie Shiley, MD [facility]   If 7PM-7AM, please contact night-coverage [email or web address]  08/02/2020, 3:33 PM

## 2020-08-03 DIAGNOSIS — S82851A Displaced trimalleolar fracture of right lower leg, initial encounter for closed fracture: Secondary | ICD-10-CM | POA: Diagnosis not present

## 2020-08-03 LAB — SURGICAL PCR SCREEN
MRSA, PCR: NEGATIVE
Staphylococcus aureus: NEGATIVE

## 2020-08-03 MED ORDER — MUPIROCIN 2 % EX OINT
1.0000 "application " | TOPICAL_OINTMENT | Freq: Two times a day (BID) | CUTANEOUS | Status: DC
  Administered 2020-08-03 – 2020-08-05 (×3): 1 via NASAL
  Filled 2020-08-03: qty 22

## 2020-08-03 MED ORDER — KCL IN DEXTROSE-NACL 20-5-0.45 MEQ/L-%-% IV SOLN
INTRAVENOUS | Status: DC
  Filled 2020-08-03 (×2): qty 1000

## 2020-08-03 MED ORDER — CEFAZOLIN SODIUM-DEXTROSE 2-4 GM/100ML-% IV SOLN
2.0000 g | INTRAVENOUS | Status: DC
  Filled 2020-08-03: qty 100

## 2020-08-03 MED ORDER — POVIDONE-IODINE 10 % EX SWAB: 2.0000 "application " | Freq: Once | CUTANEOUS | Status: DC

## 2020-08-03 MED ORDER — CHLORHEXIDINE GLUCONATE 4 % EX LIQD
60.0000 mL | Freq: Once | CUTANEOUS | Status: AC
  Administered 2020-08-04: 4 via TOPICAL
  Filled 2020-08-03: qty 15

## 2020-08-03 NOTE — Plan of Care (Signed)
  Problem: Activity: Goal: Risk for activity intolerance will decrease Outcome: Progressing   Problem: Pain Managment: Goal: General experience of comfort will improve Outcome: Progressing   Problem: Safety: Goal: Ability to remain free from injury will improve Outcome: Progressing   

## 2020-08-03 NOTE — Progress Notes (Signed)
Subjective: Pt with R ankle fracture.  Posted for surgery tomorrow morning.   Objective: Vital signs in last 24 hours: Temp:  [98.2 F (36.8 C)-98.8 F (37.1 C)] 98.2 F (36.8 C) (06/18 0527) Pulse Rate:  [68-86] 68 (06/18 0527) Resp:  [14-16] 16 (06/18 0527) BP: (155-174)/(61-87) 155/65 (06/18 0527) SpO2:  [93 %-97 %] 93 % (06/18 0527)  Intake/Output from previous day: 06/17 0701 - 06/18 0700 In: 2030.5 [P.O.:1080; I.V.:950.5] Out: 3400 [Urine:3400] Intake/Output this shift: No intake/output data recorded.  Recent Labs    07/31/20 1706 08/01/20 0105  HGB 12.0 11.9*   Recent Labs    07/31/20 1706 08/01/20 0105  WBC 8.2 9.6  RBC 3.59* 3.53*  HCT 35.6* 35.4*  PLT 242 240   Recent Labs    08/01/20 0105 08/02/20 0946  NA 136 136  K 3.4* 3.7  CL 104 102  CO2 25 28  BUN 13 7*  CREATININE 0.55  0.48 0.64  GLUCOSE 113* 107*  CALCIUM 8.9 8.7*   No results for input(s): LABPT, INR in the last 72 hours.      Assessment/Plan: R ankle trimal fracture - to OR in AM for ORIF.  NPO after midnight.  No blood thinners.       Tonya Bartlett 08/03/2020, 7:04 AM

## 2020-08-03 NOTE — Progress Notes (Signed)
PROGRESS NOTE    Tonya Bartlett  OIZ:[id number] DOB: [birth year] DOA: 07/31/2020 PCP: [name], NP   Brief Narrative: 75 year old with past medical history significant for GERD, diverticular disease, depression, ADD, migraine headaches, irritable bowel syndrome, hyperlipidemia, essential hypertension, previous history of total hip replacement who presents after mechanical fall with twisting of her right ankle.  Evaluation in the ED x-ray of the right ankle show 3 malleoli fracture with dislocation.  CT head without contrast was negative.  CT cervical spine negative. Patient was admitted for surgical management.   Assessment & Plan:   Principal Problem:   Trimalleolar fracture Active Problems:   Hyperlipidemia   Essential hypertension, benign   S/P left THA, AA   Obese  1-Trimalleolar Fracture: S/p mechanical fall:  -She underwent closed reduction and splinting in the ED -CT: post reduction; Residual displacement of trimalleolar fracture as described. The tibiotalar dislocation has been reduced, although there is mild residual widening of the ankle mortise and tibiotalar joint space widening anteromedially with interposed small fracture fragments.  No evidence of tarsal bone fracture. Moderate midfoot degenerative changes. -On dilaudid, decrease to 0.5 to avoid oversedation. Started oral Vicodin PRN.  -For surgery tomorrow -Miralax for bowel regimen.   2-HTN;  Continue with losartan.   3-HLD; Continue with statins.   4-Morbid Obesity:  Needs life style modification.   5-Hypokalemia; replete orally.  6-Head scalp laceration; staple in place. Will apply neosporin. Needs removed 7 to 10 days.   Estimated body mass index is 36.69 kg/m as calculated from the following:   Height as of this encounter: 5\' 5"  (1.651 m).   Weight as of this encounter: 100 kg.   DVT prophylaxis: Lovenox  Code Status: Full code Family Communication: care discussed with patient Disposition  Plan:  Status is: Inpatient  Remains inpatient appropriate because:IV treatments appropriate due to intensity of illness or inability to take PO  Dispo: The patient is from: Home              Anticipated d/c is to:  Might need SNF              Patient currently is not medically stable to d/c.   Difficult to place patient No        Consultants:  Ortho  Procedures:  none  Antimicrobials:    Subjective: No BM. Denies abdominal pain, Denies dyspnea. Head hematoma with discomfort.    Objective: Vitals:   08/02/20 1613 08/02/20 2019 08/02/20 2317 08/03/20 0527  BP:  (!) 174/87 (!) 164/74 (!) 155/65  Pulse:  77 86 68  Resp:  16  16  Temp: 98.7 F (37.1 C) 98.8 F (37.1 C)  98.2 F (36.8 C)  TempSrc: Oral   Oral  SpO2:  97%  93%  Weight:      Height:        Intake/Output Summary (Last 24 hours) at 08/03/2020 1436 Last data filed at 08/03/2020 1025 Gross per 24 hour  Intake 1550.53 ml  Output 2600 ml  Net -1049.47 ml    Filed Weights   08/01/20 0021  Weight: 100 kg    Examination:  General exam: NAD Respiratory system: CTA Cardiovascular system: S 1, S 2 RRR Gastrointestinal system: BS present, soft, nt Central nervous system: Non focal.  Extremities: RLE on splint.     Data Reviewed: I have personally reviewed following labs and imaging studies  CBC: Recent Labs  Lab 07/31/20 1706 08/01/20 0105  WBC 8.2 9.6  NEUTROABS 6.1  --  HGB 12.0 11.9*  HCT 35.6* 35.4*  MCV 99.2 100.3*  PLT 242 790    Basic Metabolic Panel: Recent Labs  Lab 07/31/20 1706 08/01/20 0105 08/02/20 0946  NA 138 136 136  K 3.5 3.4* 3.7  CL 107 104 102  CO2 25 25 28   GLUCOSE 109* 113* 107*  BUN 13 13 7*  CREATININE 0.60 0.55  0.48 0.64  CALCIUM 9.0 8.9 8.7*    GFR: Estimated Creatinine Clearance: 71.2 mL/min (by C-G formula based on SCr of 0.64 mg/dL). Liver Function Tests: Recent Labs  Lab 07/31/20 1706 08/01/20 0105  AST 23 22  ALT 24 22  ALKPHOS  60 61  BILITOT 0.6 0.8  PROT 6.8 6.7  ALBUMIN 3.8 4.0    No results for input(s): LIPASE, AMYLASE in the last 168 hours. No results for input(s): AMMONIA in the last 168 hours. Coagulation Profile: No results for input(s): INR, PROTIME in the last 168 hours. Cardiac Enzymes: No results for input(s): CKTOTAL, CKMB, CKMBINDEX, TROPONINI in the last 168 hours. BNP (last 3 results) No results for input(s): PROBNP in the last 8760 hours. HbA1C: No results for input(s): HGBA1C in the last 72 hours. CBG: No results for input(s): GLUCAP in the last 168 hours. Lipid Profile: No results for input(s): CHOL, HDL, LDLCALC, TRIG, CHOLHDL, LDLDIRECT in the last 72 hours. Thyroid Function Tests: No results for input(s): TSH, T4TOTAL, FREET4, T3FREE, THYROIDAB in the last 72 hours. Anemia Panel: No results for input(s): VITAMINB12, FOLATE, FERRITIN, TIBC, IRON, RETICCTPCT in the last 72 hours. Sepsis Labs: No results for input(s): PROCALCITON, LATICACIDVEN in the last 168 hours.  Recent Results (from the past 240 hour(s))  Resp Panel by RT-PCR (Flu A&B, Covid) Nasopharyngeal Swab     Status: None   Collection Time: 07/31/20  7:44 PM   Specimen: Nasopharyngeal Swab; Nasopharyngeal(NP) swabs in vial transport medium  Result Value Ref Range Status   SARS Coronavirus 2 by RT PCR NEGATIVE NEGATIVE Final    Comment: (NOTE) SARS-CoV-2 target nucleic acids are NOT DETECTED.  The SARS-CoV-2 RNA is generally detectable in upper respiratory specimens during the acute phase of infection. The lowest concentration of SARS-CoV-2 viral copies this assay can detect is 138 copies/mL. A negative result does not preclude SARS-Cov-2 infection and should not be used as the sole basis for treatment or other patient management decisions. A negative result may occur with  improper specimen collection/handling, submission of specimen other than nasopharyngeal swab, presence of viral mutation(s) within the areas  targeted by this assay, and inadequate number of viral copies(<138 copies/mL). A negative result must be combined with clinical observations, patient history, and epidemiological information. The expected result is Negative.  Fact Sheet for Patients:  EntrepreneurPulse.com.au  Fact Sheet for Healthcare Providers:  IncredibleEmployment.be  This test is no t yet approved or cleared by the [place] FDA and  has been authorized for detection and/or diagnosis of SARS-CoV-2 by FDA under an Emergency Use Authorization (EUA). This EUA will remain  in effect (meaning this test can be used) for the duration of the COVID-19 declaration under Section 564(b)(1) of the Act, 21 U.S.C.section 360bbb-3(b)(1), unless the authorization is terminated  or revoked sooner.       Influenza A by PCR NEGATIVE NEGATIVE Final   Influenza B by PCR NEGATIVE NEGATIVE Final    Comment: (NOTE) The Xpert Xpress SARS-CoV-2/FLU/RSV plus assay is intended as an aid in the diagnosis of influenza from Nasopharyngeal swab specimens and should not be used  as a sole basis for treatment. Nasal washings and aspirates are unacceptable for Xpert Xpress SARS-CoV-2/FLU/RSV testing.  Fact Sheet for Patients: EntrepreneurPulse.com.au  Fact Sheet for Healthcare Providers: IncredibleEmployment.be  This test is not yet approved or cleared by the [place] FDA and has been authorized for detection and/or diagnosis of SARS-CoV-2 by FDA under an Emergency Use Authorization (EUA). This EUA will remain in effect (meaning this test can be used) for the duration of the COVID-19 declaration under Section 564(b)(1) of the Act, 21 U.S.C. section 360bbb-3(b)(1), unless the authorization is terminated or revoked.  Performed at [facility], [place] [address], [place], [place] [address]           Radiology Studies: No results  found.      Scheduled Meds:  betamethasone dipropionate   Topical BID   cholecalciferol  5,000 Units Oral QPM   escitalopram  15 mg Oral Daily   losartan  50 mg Oral Daily   [START ON 08/06/2020] methotrexate  15 mg Oral Weekly   neomycin-bacitracin-polymyxin   Topical BID   polyethylene glycol  17 g Oral BID   senna-docusate  1 tablet Oral BID   sulfamethoxazole-trimethoprim  1 tablet Oral Daily   Continuous Infusions:     LOS: 3 days    Time spent: 35 minutes.     Elmarie Shiley, MD [facility]   If 7PM-7AM, please contact night-coverage [email or web address]  08/03/2020, 2:36 PM

## 2020-08-03 NOTE — Plan of Care (Signed)
  Problem: Pain Managment: Goal: General experience of comfort will improve Outcome: Progressing   Problem: Coping: Goal: Level of anxiety will decrease Outcome: Progressing   

## 2020-08-04 ENCOUNTER — Encounter (HOSPITAL_COMMUNITY)
Admission: EM | Disposition: A | Discharge status: Skilled Nursing Facility | Payer: Self-pay | Source: Home / Self Care | Attending: Internal Medicine

## 2020-08-04 ENCOUNTER — Inpatient Hospital Stay (HOSPITAL_COMMUNITY): Payer: Medicare Other | Admitting: Certified Registered Nurse Anesthetist

## 2020-08-04 DIAGNOSIS — S82851A Displaced trimalleolar fracture of right lower leg, initial encounter for closed fracture: Secondary | ICD-10-CM | POA: Diagnosis not present

## 2020-08-04 HISTORY — PX: ORIF ANKLE FRACTURE: SHX5408

## 2020-08-04 LAB — CBC
HCT: 33.9 % — ABNORMAL LOW (ref 36.0–46.0)
Hemoglobin: 11.2 g/dL — ABNORMAL LOW (ref 12.0–15.0)
MCH: 33.7 pg (ref 26.0–34.0)
MCHC: 33 g/dL (ref 30.0–36.0)
MCV: 102.1 fL — ABNORMAL HIGH (ref 80.0–100.0)
Platelets: 245 10*3/uL (ref 150–400)
RBC: 3.32 MIL/uL — ABNORMAL LOW (ref 3.87–5.11)
RDW: 12.2 % (ref 11.5–15.5)
WBC: 6.1 10*3/uL (ref 4.0–10.5)
nRBC: 0 % (ref 0.0–0.2)

## 2020-08-04 LAB — BASIC METABOLIC PANEL
Anion gap: 6 (ref 5–15)
BUN: 12 mg/dL (ref 8–23)
CO2: 29 mmol/L (ref 22–32)
Calcium: 8.7 mg/dL — ABNORMAL LOW (ref 8.9–10.3)
Chloride: 101 mmol/L (ref 98–111)
Creatinine, Ser: 0.78 mg/dL (ref 0.44–1.00)
GFR, Estimated: >60 mL/min (ref >60)
Glucose, Bld: 129 mg/dL — ABNORMAL HIGH (ref 70–99)
Potassium: 4 mmol/L (ref 3.5–5.1)
Sodium: 136 mmol/L (ref 135–145)

## 2020-08-04 SURGERY — OPEN REDUCTION INTERNAL FIXATION (ORIF) ANKLE FRACTURE
Anesthesia: Regional | Site: Ankle | Laterality: Right

## 2020-08-04 MED ORDER — SENNA 8.6 MG PO TABS
1.0000 | ORAL_TABLET | Freq: Two times a day (BID) | ORAL | Status: DC
  Administered 2020-08-05 (×2): 8.6 mg via ORAL
  Filled 2020-08-04 (×3): qty 1

## 2020-08-04 MED ORDER — VANCOMYCIN HCL 1000 MG IV SOLR
INTRAVENOUS | Status: AC
  Filled 2020-08-04: qty 1000

## 2020-08-04 MED ORDER — DEXAMETHASONE SODIUM PHOSPHATE 10 MG/ML IJ SOLN
INTRAMUSCULAR | Status: DC | PRN
  Administered 2020-08-04: 5 mg

## 2020-08-04 MED ORDER — ACETAMINOPHEN 500 MG PO TABS
ORAL_TABLET | ORAL | Status: AC
  Administered 2020-08-04: 1000 mg
  Filled 2020-08-04: qty 2

## 2020-08-04 MED ORDER — MIDAZOLAM HCL 5 MG/5ML IJ SOLN
INTRAMUSCULAR | Status: DC | PRN
  Administered 2020-08-04 (×2): 1 mg via INTRAVENOUS

## 2020-08-04 MED ORDER — OXYCODONE HCL 5 MG PO TABS
10.0000 mg | ORAL_TABLET | ORAL | Status: DC | PRN
  Administered 2020-08-05 – 2020-08-06 (×4): 10 mg via ORAL
  Filled 2020-08-04 (×2): qty 2

## 2020-08-04 MED ORDER — ROPIVACAINE HCL 5 MG/ML IJ SOLN
INTRAMUSCULAR | Status: DC | PRN
  Administered 2020-08-04: 20 mL via PERINEURAL

## 2020-08-04 MED ORDER — DIPHENHYDRAMINE HCL 12.5 MG/5ML PO ELIX
12.5000 mg | ORAL_SOLUTION | ORAL | Status: DC | PRN
  Administered 2020-08-04 – 2020-08-05 (×2): 25 mg via ORAL
  Filled 2020-08-04 (×2): qty 10

## 2020-08-04 MED ORDER — ONDANSETRON HCL 4 MG PO TABS: 4.0000 mg | ORAL_TABLET | Freq: Four times a day (QID) | ORAL | Status: DC | PRN

## 2020-08-04 MED ORDER — BUPIVACAINE LIPOSOME 1.3 % IJ SUSP
INTRAMUSCULAR | Status: DC | PRN
  Administered 2020-08-04: 10 mL via PERINEURAL

## 2020-08-04 MED ORDER — FENTANYL CITRATE (PF) 100 MCG/2ML IJ SOLN
INTRAMUSCULAR | Status: AC
  Filled 2020-08-04: qty 2

## 2020-08-04 MED ORDER — SODIUM CHLORIDE 0.9 % IV SOLN
INTRAVENOUS | Status: DC
Start: 2020-08-04 — End: 2020-08-05

## 2020-08-04 MED ORDER — DEXAMETHASONE SODIUM PHOSPHATE 10 MG/ML IJ SOLN
INTRAMUSCULAR | Status: DC | PRN
  Administered 2020-08-04: 10 mg via INTRAVENOUS

## 2020-08-04 MED ORDER — ONDANSETRON HCL 4 MG/2ML IJ SOLN
INTRAMUSCULAR | Status: AC
  Filled 2020-08-04: qty 2

## 2020-08-04 MED ORDER — 0.9 % SODIUM CHLORIDE (POUR BTL) OPTIME
TOPICAL | Status: DC | PRN
  Administered 2020-08-04: 1000 mL

## 2020-08-04 MED ORDER — FENTANYL CITRATE (PF) 100 MCG/2ML IJ SOLN
INTRAMUSCULAR | Status: AC
  Administered 2020-08-04: 50 ug via INTRAVENOUS
  Filled 2020-08-04: qty 2

## 2020-08-04 MED ORDER — DEXAMETHASONE SODIUM PHOSPHATE 10 MG/ML IJ SOLN
INTRAMUSCULAR | Status: AC
  Filled 2020-08-04: qty 1

## 2020-08-04 MED ORDER — PROPOFOL 10 MG/ML IV BOLUS
INTRAVENOUS | Status: DC | PRN
  Administered 2020-08-04: 120 mg via INTRAVENOUS

## 2020-08-04 MED ORDER — FENTANYL CITRATE (PF) 100 MCG/2ML IJ SOLN
INTRAMUSCULAR | Status: DC | PRN
  Administered 2020-08-04 (×2): 50 ug via INTRAVENOUS

## 2020-08-04 MED ORDER — ACETAMINOPHEN 325 MG PO TABS
325.0000 mg | ORAL_TABLET | Freq: Four times a day (QID) | ORAL | Status: DC | PRN
  Administered 2020-08-06: 650 mg via ORAL
  Filled 2020-08-04: qty 2

## 2020-08-04 MED ORDER — HYDROMORPHONE HCL 1 MG/ML IJ SOLN: 0.5000 mg | INTRAMUSCULAR | Status: DC | PRN

## 2020-08-04 MED ORDER — OXYCODONE HCL 5 MG PO TABS
5.0000 mg | ORAL_TABLET | ORAL | Status: DC | PRN
  Administered 2020-08-04 – 2020-08-05 (×3): 10 mg via ORAL
  Administered 2020-08-05: 5 mg via ORAL
  Administered 2020-08-05 – 2020-08-06 (×3): 10 mg via ORAL
  Filled 2020-08-04 (×8): qty 2
  Filled 2020-08-04: qty 1

## 2020-08-04 MED ORDER — PROPOFOL 10 MG/ML IV BOLUS
INTRAVENOUS | Status: AC
  Filled 2020-08-04: qty 20

## 2020-08-04 MED ORDER — MIDAZOLAM HCL 2 MG/2ML IJ SOLN
INTRAMUSCULAR | Status: AC
  Filled 2020-08-04: qty 2

## 2020-08-04 MED ORDER — FENTANYL CITRATE (PF) 100 MCG/2ML IJ SOLN
25.0000 ug | INTRAMUSCULAR | Status: DC | PRN
  Administered 2020-08-04 (×2): 50 ug via INTRAVENOUS

## 2020-08-04 MED ORDER — EPHEDRINE 5 MG/ML INJ
INTRAVENOUS | Status: AC
  Filled 2020-08-04: qty 10

## 2020-08-04 MED ORDER — LACTATED RINGERS IV SOLN: INTRAVENOUS | Status: DC | PRN

## 2020-08-04 MED ORDER — ONDANSETRON HCL 4 MG/2ML IJ SOLN
4.0000 mg | Freq: Four times a day (QID) | INTRAMUSCULAR | Status: DC | PRN
  Administered 2020-08-06: 4 mg via INTRAVENOUS
  Filled 2020-08-04: qty 2

## 2020-08-04 MED ORDER — ACETAMINOPHEN 500 MG PO TABS: 1000.0000 mg | ORAL_TABLET | Freq: Once | ORAL | Status: AC

## 2020-08-04 MED ORDER — APREPITANT 40 MG PO CAPS: 40.0000 mg | ORAL_CAPSULE | Freq: Once | ORAL | Status: DC

## 2020-08-04 MED ORDER — LIDOCAINE 2% (20 MG/ML) 5 ML SYRINGE
INTRAMUSCULAR | Status: AC
  Filled 2020-08-04: qty 5

## 2020-08-04 MED ORDER — LIDOCAINE 2% (20 MG/ML) 5 ML SYRINGE
INTRAMUSCULAR | Status: DC | PRN
  Administered 2020-08-04: 100 mg via INTRAVENOUS

## 2020-08-04 MED ORDER — ONDANSETRON HCL 4 MG/2ML IJ SOLN
INTRAMUSCULAR | Status: DC | PRN
  Administered 2020-08-04: 4 mg via INTRAVENOUS

## 2020-08-04 MED ORDER — VANCOMYCIN HCL 1000 MG IV SOLR
INTRAVENOUS | Status: DC | PRN
  Administered 2020-08-04: 1000 mg via TOPICAL

## 2020-08-04 MED ORDER — BUPIVACAINE HCL (PF) 0.5 % IJ SOLN
INTRAMUSCULAR | Status: DC | PRN
  Administered 2020-08-04: 20 mL via PERINEURAL

## 2020-08-04 MED ORDER — EPHEDRINE SULFATE-NACL 50-0.9 MG/10ML-% IV SOSY
PREFILLED_SYRINGE | INTRAVENOUS | Status: DC | PRN
  Administered 2020-08-04: 10 mg via INTRAVENOUS
  Administered 2020-08-04: 5 mg via INTRAVENOUS

## 2020-08-04 MED ORDER — ENOXAPARIN SODIUM 40 MG/0.4ML IJ SOSY
40.0000 mg | PREFILLED_SYRINGE | INTRAMUSCULAR | Status: DC
  Administered 2020-08-05 – 2020-08-06 (×2): 40 mg via SUBCUTANEOUS
  Filled 2020-08-04 (×2): qty 0.4

## 2020-08-04 SURGICAL SUPPLY — 80 items
ANCHOR SUT KEITH ABD SZ2 STR (SUTURE) ×2 IMPLANT
BAG COUNTER SPONGE SURGICOUNT (BAG) IMPLANT
BAG SPEC THK2 15X12 ZIP CLS (MISCELLANEOUS) ×1
BAG SPNG CNTER NS LX DISP (BAG)
BAG ZIPLOCK 12X15 (MISCELLANEOUS) ×2 IMPLANT
BIT DRILL 2.5X2.75 QC CALB (BIT) ×2 IMPLANT
BIT DRILL 2.9 CANN QC NONSTRL (BIT) ×2 IMPLANT
BNDG COHESIVE 4X5 TAN STRL (GAUZE/BANDAGES/DRESSINGS) ×2 IMPLANT
BNDG COHESIVE 6X5 TAN STRL LF (GAUZE/BANDAGES/DRESSINGS) ×2 IMPLANT
BNDG ELASTIC 4X5.8 VLCR STR LF (GAUZE/BANDAGES/DRESSINGS) ×2 IMPLANT
BNDG ELASTIC 6X5.8 VLCR STR LF (GAUZE/BANDAGES/DRESSINGS) ×2 IMPLANT
BNDG GAUZE ELAST 4 BULKY (GAUZE/BANDAGES/DRESSINGS) ×4 IMPLANT
COVER SURGICAL LIGHT HANDLE (MISCELLANEOUS) ×2 IMPLANT
CUFF TOURN SGL QUICK 34 (TOURNIQUET CUFF) ×2
CUFF TRNQT CYL 34X4.125X (TOURNIQUET CUFF) ×1 IMPLANT
DECANTER SPIKE VIAL GLASS SM (MISCELLANEOUS) ×2 IMPLANT
DRAPE C-ARM 42X120 X-RAY (DRAPES) ×2 IMPLANT
DRAPE EXTREMITY T 121X128X90 (DISPOSABLE) ×2 IMPLANT
DRAPE OEC MINIVIEW 54X84 (DRAPES) ×2 IMPLANT
DRAPE ORTHO SPLIT 77X108 STRL (DRAPES) ×4
DRAPE SURG ORHT 6 SPLT 77X108 (DRAPES) ×2 IMPLANT
DRAPE U-SHAPE 47X51 STRL (DRAPES) ×2 IMPLANT
DRSG ADAPTIC 3X8 NADH LF (GAUZE/BANDAGES/DRESSINGS) ×2 IMPLANT
DRSG MEPITEL 8X12 (GAUZE/BANDAGES/DRESSINGS) ×2 IMPLANT
DRSG PAD ABDOMINAL 8X10 ST (GAUZE/BANDAGES/DRESSINGS) ×2 IMPLANT
DURAPREP 26ML APPLICATOR (WOUND CARE) ×2 IMPLANT
ELECT REM PT RETURN 15FT ADLT (MISCELLANEOUS) ×2 IMPLANT
FACESHIELD WRAPAROUND (MASK) ×2 IMPLANT
GAUZE SPONGE 4X4 12PLY STRL (GAUZE/BANDAGES/DRESSINGS) ×2 IMPLANT
GLOVE SRG 8 PF TXTR STRL LF DI (GLOVE) ×1 IMPLANT
GLOVE SURG ENC MOIS LTX SZ8 (GLOVE) ×2 IMPLANT
GLOVE SURG ORTHO LTX SZ8.5 (GLOVE) ×2 IMPLANT
GLOVE SURG UNDER LTX SZ8 (GLOVE) ×2 IMPLANT
GLOVE SURG UNDER POLY LF SZ8 (GLOVE) ×2
K-WIRE ACE 1.6X6 (WIRE) ×4
KIT BASIN OR (CUSTOM PROCEDURE TRAY) ×2 IMPLANT
KIT TURNOVER KIT A (KITS) ×2 IMPLANT
KWIRE ACE 1.6X6 (WIRE) ×2 IMPLANT
NEEDLE HYPO 22GX1.5 SAFETY (NEEDLE) ×2 IMPLANT
NEEDLE MAYO CATGUT SZ4 (NEEDLE) ×2 IMPLANT
NS IRRIG 1000ML POUR BTL (IV SOLUTION) ×2 IMPLANT
PACK TOTAL JOINT (CUSTOM PROCEDURE TRAY) ×2 IMPLANT
PAD CAST 4YDX4 CTTN HI CHSV (CAST SUPPLIES) ×2 IMPLANT
PADDING CAST COTTON 4X4 STRL (CAST SUPPLIES) ×4
PADDING CAST COTTON 6X4 STRL (CAST SUPPLIES) ×2 IMPLANT
PADDING CAST SYN 6 (CAST SUPPLIES) ×1
PADDING CAST SYNTHETIC 4 (CAST SUPPLIES) ×1
PADDING CAST SYNTHETIC 4X4 STR (CAST SUPPLIES) ×1 IMPLANT
PADDING CAST SYNTHETIC 6X4 NS (CAST SUPPLIES) ×1 IMPLANT
PENCIL SMOKE EVACUATOR (MISCELLANEOUS) IMPLANT
PLATE ACE 3.5MM 2HOLE (Plate) ×2 IMPLANT
PLATE LOCK 8H 103 BILAT FIB (Plate) ×2 IMPLANT
PROTECTOR NERVE ULNAR (MISCELLANEOUS) ×2 IMPLANT
SCREW ACE CAN 4.0 40M (Screw) ×4 IMPLANT
SCREW ACE CAN 4.0 44M (Screw) ×2 IMPLANT
SCREW CORTICAL 3.5MM 36MM (Screw) ×2 IMPLANT
SCREW LOCK CORT STAR 3.5X10 (Screw) ×2 IMPLANT
SCREW LOCK CORT STAR 3.5X14 (Screw) ×4 IMPLANT
SCREW LOCK CORT STAR 3.5X18 (Screw) ×4 IMPLANT
SCREW LOW PROFILE 22MMX3.5MM (Screw) ×2 IMPLANT
SCREW LP 3.5 (Screw) ×2 IMPLANT
SCREW NON LOCKING LP 3.5 16MM (Screw) ×2 IMPLANT
SPLINT PLASTER CAST XFAST 5X30 (CAST SUPPLIES) ×1 IMPLANT
SPLINT PLASTER XFAST SET 5X30 (CAST SUPPLIES) ×1
SPONGE LAP 4X18 RFD (DISPOSABLE) ×4 IMPLANT
SPONGE T-LAP 18X18 ~~LOC~~+RFID (SPONGE) ×2 IMPLANT
STAPLER VISISTAT 35W (STAPLE) ×2 IMPLANT
STRIP CLOSURE SKIN 1/2X4 (GAUZE/BANDAGES/DRESSINGS) ×2 IMPLANT
SUCTION FRAZIER HANDLE 10FR (MISCELLANEOUS) ×2
SUCTION TUBE FRAZIER 10FR DISP (MISCELLANEOUS) ×1 IMPLANT
SUT MNCRL AB 4-0 PS2 18 (SUTURE) ×4 IMPLANT
SUT PROLENE 3 0 PS 2 (SUTURE) ×2 IMPLANT
SUT SILK 2 0 (SUTURE) ×2
SUT SILK 2-0 18XBRD TIE 12 (SUTURE) ×1 IMPLANT
SUT VIC AB 2-0 CT1 27 (SUTURE) ×2
SUT VIC AB 2-0 CT1 TAPERPNT 27 (SUTURE) ×1 IMPLANT
SUT VIC AB 3-0 PS2 18 (SUTURE) ×2
SUT VIC AB 3-0 PS2 18XBRD (SUTURE) ×1 IMPLANT
SYR CONTROL 10ML LL (SYRINGE) ×2 IMPLANT
WATER STERILE IRR 1000ML POUR (IV SOLUTION) ×2 IMPLANT

## 2020-08-04 NOTE — Anesthesia Procedure Notes (Signed)
Procedure Name: LMA Insertion Date/Time: 08/04/2020 7:52 AM Performed by: Lind Covert, CRNA Pre-anesthesia Checklist: Patient identified, Emergency Drugs available, Suction available, Patient being monitored and Timeout performed Patient Re-evaluated:Patient Re-evaluated prior to induction Oxygen Delivery Method: Circle system utilized Preoxygenation: Pre-oxygenation with 100% oxygen Induction Type: IV induction LMA: LMA inserted LMA Size: 4.0 Tube type: Oral Number of attempts: 1 Placement Confirmation: ETT inserted through vocal cords under direct vision and positive ETCO2 Tube secured with: Tape Dental Injury: Teeth and Oropharynx as per pre-operative assessment

## 2020-08-04 NOTE — Discharge Instructions (Signed)
Wylene Simmer, MD [facility]  Please read the following information regarding your care after surgery.  Medications  You only need a prescription for the narcotic pain medicine (ex. oxycodone, Percocet, Norco).  All of the other medicines listed below are available over the counter. X Aleve 2 pills twice a day for the first 3 days after surgery. X acetominophen (Tylenol) 650 mg every 4-6 hours as you need for minor to moderate pain X Oxycodone 5 mg as prescribed and as needed for severe pain  Narcotic pain medicine (ex. oxycodone, Percocet, Vicodin) will cause constipation.  To prevent this problem, take the following medicines while you are taking any pain medicine. X docusate sodium (Colace) 100 mg twice a day X senna (Senokot) 2 tablets twice a day  X To help prevent blood clots, take Xarelto as prescribed two weeks after surgery.  You should also get up every hour while you are awake to move around.    Weight Bearing X Do not bear any weight on the operated leg or foot.  Cast / Splint / Dressing X Keep your splint, cast or dressing clean and dry.  Don't put anything (coat hanger, pencil, etc) down inside of it.  If it gets damp, use a hair dryer on the cool setting to dry it.  If it gets soaked, call the office to schedule an appointment for a cast change.   After your dressing, cast or splint is removed; you may shower, but do not soak or scrub the wound.  Allow the water to run over it, and then gently pat it dry.  Swelling It is normal for you to have swelling where you had surgery.  To reduce swelling and pain, keep your toes above your nose for at least 3 days after surgery.  It may be necessary to keep your foot or leg elevated for several weeks.  If it hurts, it should be elevated.  Follow Up Call my office at [phone] when you are discharged from the hospital or surgery center to schedule an appointment to be seen two weeks after surgery.  Call my office at [phone]  if you develop a fever >101.5 F, nausea, vomiting, bleeding from the surgical site or severe pain.

## 2020-08-04 NOTE — Anesthesia Procedure Notes (Signed)
Anesthesia Regional Block: Popliteal block   Pre-Anesthetic Checklist: , timeout performed,  Correct Patient, Correct Site, Correct Laterality,  Correct Procedure, Correct Position, site marked,  Risks and benefits discussed,  Surgical consent,  Pre-op evaluation,  At surgeon's request and post-op pain management  Laterality: Right  Prep: Maximum Sterile Barrier Precautions used, chloraprep       Needles:  Injection technique: Single-shot  Needle Type: Echogenic Stimulator Needle     Needle Length: 9cm  Needle Gauge: 22     Additional Needles:   Procedures:,,,, ultrasound used (permanent image in chart),,    Narrative:  Start time: 08/04/2020 7:30 AM End time: 08/04/2020 7:34 AM Injection made incrementally with aspirations every 5 mL.  Performed by: Personally  Anesthesiologist: Freddrick March, MD  Additional Notes: Monitors applied. No increased pain on injection. No increased resistance to injection. Injection made in 5cc increments. Good needle visualization. Patient tolerated procedure well.

## 2020-08-04 NOTE — Progress Notes (Signed)
PROGRESS NOTE    Tonya Bartlett  IPJ:[id number] DOB: [birth year] DOA: 07/31/2020 PCP: [name], NP   Brief Narrative: 75 year old with past medical history significant for GERD, diverticular disease, depression, ADD, migraine headaches, irritable bowel syndrome, hyperlipidemia, essential hypertension, previous history of total hip replacement who presents after mechanical fall with twisting of her right ankle.  Evaluation in the ED x-ray of the right ankle show 3 malleoli fracture with dislocation.  CT head without contrast was negative.  CT cervical spine negative. Patient was admitted for surgical management.   Assessment & Plan:   Principal Problem:   Trimalleolar fracture Active Problems:   Hyperlipidemia   Essential hypertension, benign   S/P left THA, AA   Obese  1-Trimalleolar Fracture: S/p mechanical fall:  -She underwent closed reduction and splinting in the ED -CT: post reduction; Residual displacement of trimalleolar fracture as described. The tibiotalar dislocation has been reduced, although there is mild residual widening of the ankle mortise and tibiotalar joint space widening anteromedially with interposed small fracture fragments.  No evidence of tarsal bone fracture. Moderate midfoot degenerative changes. -On dilaudid, decrease to 0.5 to avoid oversedation. Started oral Vicodin PRN.  -Underwent; open treatment of right ankle trimalleolar fracture with internal fixation of the posterior lip. 6/19 -Miralax for bowel regimen.   2-HTN;  Continue with losartan.   3-HLD; Continue with statins.   4-Morbid Obesity:  Needs life style modification.   5-Hypokalemia; replete orally.  6-Head scalp laceration; staple in place. Will apply neosporin. Needs removed 7 to 10 days.   Estimated body mass index is 36.69 kg/m as calculated from the following:   Height as of this encounter: 5\' 5"  (1.651 m).   Weight as of this encounter: 100 kg.   DVT prophylaxis:  Lovenox  Code Status: Full code Family Communication: care discussed with patient Disposition Plan:  Status is: Inpatient  Remains inpatient appropriate because:IV treatments appropriate due to intensity of illness or inability to take PO  Dispo: The patient is from: Home              Anticipated d/c is to:  Might need SNF              Patient currently is not medically stable to d/c.   Difficult to place patient No        Consultants:  Ortho  Procedures:  none  Antimicrobials:    Subjective: She is doing ok, she would like to speak with ortho about sx.  She had BM last night.  She would like to go to rehab.   Objective: Vitals:   08/04/20 1010 08/04/20 1020 08/04/20 1050 08/04/20 1249  BP:  (!) 141/52 (!) 146/65 131/62  Pulse: 71 79 72 96  Resp: 11 17 16 18   Temp: 97.8 F (36.6 C)  98 F (36.7 C) 98.2 F (36.8 C)  TempSrc:   Axillary Oral  SpO2: 98% 96%  98%  Weight:      Height:        Intake/Output Summary (Last 24 hours) at 08/04/2020 1326 Last data filed at 08/04/2020 0945 Gross per 24 hour  Intake 2092.5 ml  Output 2400 ml  Net -307.5 ml    Filed Weights   08/01/20 0021  Weight: 100 kg    Examination:  General exam: NAD Respiratory system: CTA Cardiovascular system:  S 1, S 2 RRR Gastrointestinal system: BS present, Soft,  nt Central nervous system: No focal.  Extremities: RLE with dressing  Data Reviewed: I have personally reviewed following labs and imaging studies  CBC: Recent Labs  Lab 07/31/20 1706 08/01/20 0105 08/04/20 1022  WBC 8.2 9.6 6.1  NEUTROABS 6.1  --   --   HGB 12.0 11.9* 11.2*  HCT 35.6* 35.4* 33.9*  MCV 99.2 100.3* 102.1*  PLT 242 240 175    Basic Metabolic Panel: Recent Labs  Lab 07/31/20 1706 08/01/20 0105 08/02/20 0946 08/04/20 1022  NA 138 136 136 136  K 3.5 3.4* 3.7 4.0  CL 107 104 102 101  CO2 25 25 28 29   GLUCOSE 109* 113* 107* 129*  BUN 13 13 7* 12  CREATININE 0.60 0.55  0.48 0.64  0.78  CALCIUM 9.0 8.9 8.7* 8.7*    GFR: Estimated Creatinine Clearance: 71.2 mL/min (by C-G formula based on SCr of 0.78 mg/dL). Liver Function Tests: Recent Labs  Lab 07/31/20 1706 08/01/20 0105  AST 23 22  ALT 24 22  ALKPHOS 60 61  BILITOT 0.6 0.8  PROT 6.8 6.7  ALBUMIN 3.8 4.0    No results for input(s): LIPASE, AMYLASE in the last 168 hours. No results for input(s): AMMONIA in the last 168 hours. Coagulation Profile: No results for input(s): INR, PROTIME in the last 168 hours. Cardiac Enzymes: No results for input(s): CKTOTAL, CKMB, CKMBINDEX, TROPONINI in the last 168 hours. BNP (last 3 results) No results for input(s): PROBNP in the last 8760 hours. HbA1C: No results for input(s): HGBA1C in the last 72 hours. CBG: No results for input(s): GLUCAP in the last 168 hours. Lipid Profile: No results for input(s): CHOL, HDL, LDLCALC, TRIG, CHOLHDL, LDLDIRECT in the last 72 hours. Thyroid Function Tests: No results for input(s): TSH, T4TOTAL, FREET4, T3FREE, THYROIDAB in the last 72 hours. Anemia Panel: No results for input(s): VITAMINB12, FOLATE, FERRITIN, TIBC, IRON, RETICCTPCT in the last 72 hours. Sepsis Labs: No results for input(s): PROCALCITON, LATICACIDVEN in the last 168 hours.  Recent Results (from the past 240 hour(s))  Resp Panel by RT-PCR (Flu A&B, Covid) Nasopharyngeal Swab     Status: None   Collection Time: 07/31/20  7:44 PM   Specimen: Nasopharyngeal Swab; Nasopharyngeal(NP) swabs in vial transport medium  Result Value Ref Range Status   SARS Coronavirus 2 by RT PCR NEGATIVE NEGATIVE Final    Comment: (NOTE) SARS-CoV-2 target nucleic acids are NOT DETECTED.  The SARS-CoV-2 RNA is generally detectable in upper respiratory specimens during the acute phase of infection. The lowest concentration of SARS-CoV-2 viral copies this assay can detect is 138 copies/mL. A negative result does not preclude SARS-Cov-2 infection and should not be used as the sole  basis for treatment or other patient management decisions. A negative result may occur with  improper specimen collection/handling, submission of specimen other than nasopharyngeal swab, presence of viral mutation(s) within the areas targeted by this assay, and inadequate number of viral copies(<138 copies/mL). A negative result must be combined with clinical observations, patient history, and epidemiological information. The expected result is Negative.  Fact Sheet for Patients:  EntrepreneurPulse.com.au  Fact Sheet for Healthcare Providers:  IncredibleEmployment.be  This test is no t yet approved or cleared by the [place] FDA and  has been authorized for detection and/or diagnosis of SARS-CoV-2 by FDA under an Emergency Use Authorization (EUA). This EUA will remain  in effect (meaning this test can be used) for the duration of the COVID-19 declaration under Section 564(b)(1) of the Act, 21 U.S.C.section 360bbb-3(b)(1), unless the authorization is terminated  or revoked sooner.  Influenza A by PCR NEGATIVE NEGATIVE Final   Influenza B by PCR NEGATIVE NEGATIVE Final    Comment: (NOTE) The Xpert Xpress SARS-CoV-2/FLU/RSV plus assay is intended as an aid in the diagnosis of influenza from Nasopharyngeal swab specimens and should not be used as a sole basis for treatment. Nasal washings and aspirates are unacceptable for Xpert Xpress SARS-CoV-2/FLU/RSV testing.  Fact Sheet for Patients: EntrepreneurPulse.com.au  Fact Sheet for Healthcare Providers: IncredibleEmployment.be  This test is not yet approved or cleared by the [place] FDA and has been authorized for detection and/or diagnosis of SARS-CoV-2 by FDA under an Emergency Use Authorization (EUA). This EUA will remain in effect (meaning this test can be used) for the duration of the COVID-19 declaration under Section 564(b)(1) of the Act,  21 U.S.C. section 360bbb-3(b)(1), unless the authorization is terminated or revoked.  Performed at [facility], [place] [address] [place], [place] [address]   Surgical PCR screen     Status: None   Collection Time: 08/03/20  3:30 PM   Specimen: Nasal Mucosa; Nasal Swab  Result Value Ref Range Status   MRSA, PCR NEGATIVE NEGATIVE Final   Staphylococcus aureus NEGATIVE NEGATIVE Final    Comment: (NOTE) The Xpert SA Assay (FDA approved for NASAL specimens in patients 64 years of age and older), is one component of a comprehensive surveillance program. It is not intended to diagnose infection nor to guide or monitor treatment. Performed at [facility], [place] [address] [place], [place] [address]           Radiology Studies: No results found.      Scheduled Meds:  aprepitant  40 mg Oral Once   betamethasone dipropionate   Topical BID   cholecalciferol  5,000 Units Oral QPM   [START ON 08/05/2020] enoxaparin (LOVENOX) injection  40 mg Subcutaneous Q24H   escitalopram  15 mg Oral Daily   fentaNYL       losartan  50 mg Oral Daily   [START ON 08/06/2020] methotrexate  15 mg Oral Weekly   mupirocin ointment  1 application Nasal BID   neomycin-bacitracin-polymyxin   Topical BID   polyethylene glycol  17 g Oral BID   senna  1 tablet Oral BID   senna-docusate  1 tablet Oral BID   sulfamethoxazole-trimethoprim  1 tablet Oral Daily   Continuous Infusions:  sodium chloride 75 mL/hr at 08/04/20 1123      LOS: 4 days    Time spent: 35 minutes.     Elmarie Shiley, MD [facility]   If 7PM-7AM, please contact night-coverage [email or web address]  08/04/2020, 1:26 PM

## 2020-08-04 NOTE — Anesthesia Procedure Notes (Signed)
Anesthesia Regional Block: Adductor canal block   Pre-Anesthetic Checklist: , timeout performed,  Correct Patient, Correct Site, Correct Laterality,  Correct Procedure, Correct Position, site marked,  Risks and benefits discussed,  Surgical consent,  Pre-op evaluation,  At surgeon's request and post-op pain management  Laterality: Right  Prep: Maximum Sterile Barrier Precautions used, chloraprep       Needles:  Injection technique: Single-shot  Needle Type: Echogenic Stimulator Needle     Needle Length: 9cm  Needle Gauge: 22     Additional Needles:   Procedures:,,,, ultrasound used (permanent image in chart),,    Narrative:  Start time: 08/04/2020 7:34 AM End time: 08/04/2020 7:37 AM Injection made incrementally with aspirations every 5 mL.  Performed by: Personally  Anesthesiologist: Freddrick March, MD  Additional Notes: Monitors applied. No increased pain on injection. No increased resistance to injection. Injection made in 5cc increments. Good needle visualization. Patient tolerated procedure well.

## 2020-08-04 NOTE — Anesthesia Postprocedure Evaluation (Signed)
Anesthesia Post Note  Patient: Tonya Bartlett  Procedure(s) Performed: OPEN REDUCTION INTERNAL FIXATION (ORIF) ANKLE FRACTURE (Right: Ankle)     Patient location during evaluation: PACU Anesthesia Type: Regional and General Level of consciousness: awake and alert Pain management: pain level controlled Vital Signs Assessment: post-procedure vital signs reviewed and stable Respiratory status: spontaneous breathing, nonlabored ventilation, respiratory function stable and patient connected to nasal cannula oxygen Cardiovascular status: blood pressure returned to baseline and stable Postop Assessment: no apparent nausea or vomiting Anesthetic complications: no   No notable events documented.  Last Vitals:  Vitals:   08/04/20 1010 08/04/20 1020  BP:  (!) 141/52  Pulse: 71 79  Resp: 11 17  Temp: 36.6 C   SpO2: 98% 96%    Last Pain:  Vitals:   08/04/20 1030  TempSrc:   PainSc: Asleep        RLE Motor Response: Responds to commands;Purposeful movement (08/04/20 1030) RLE Sensation: Full sensation (08/04/20 1030)   R Sensory Level: S1-Sole of foot, small toes (08/04/20 1030)  Kylene Zamarron L Nataya Bastedo

## 2020-08-04 NOTE — Anesthesia Preprocedure Evaluation (Addendum)
Anesthesia Evaluation  Patient identified by MRN, date of birth, ID band Patient awake    Reviewed: Allergy & Precautions, NPO status , Patient's Chart, lab work & pertinent test results  History of Anesthesia Complications (+) PONV  Airway Mallampati: II  TM Distance: >3 FB Neck ROM: Full    Dental  (+) Teeth Intact, Dental Advisory Given, Caps All caps:   Pulmonary neg pulmonary ROS, former smoker,    Pulmonary exam normal breath sounds clear to auscultation       Cardiovascular hypertension, Pt. on medications + DVT (in setting of postop)  Normal cardiovascular exam Rhythm:Regular Rate:Normal     Neuro/Psych  Headaches, PSYCHIATRIC DISORDERS Depression    GI/Hepatic hiatal hernia, GERD  Controlled and Medicated,(+)     substance abuse (remote history)  alcohol use,   Endo/Other  negative endocrine ROSObese BMI 37  Renal/GU negative Renal ROS  negative genitourinary   Musculoskeletal  (+) Arthritis ,   Abdominal   Peds  Hematology negative hematology ROS (+)   Anesthesia Other Findings   Reproductive/Obstetrics                            Anesthesia Physical Anesthesia Plan  ASA: 3  Anesthesia Plan: General and Regional   Post-op Pain Management:  Regional for Post-op pain   Induction: Intravenous  PONV Risk Score and Plan: 4 or greater and Ondansetron, Dexamethasone, Midazolam, Treatment may vary due to age or medical condition and Aprepitant  Airway Management Planned: LMA  Additional Equipment:   Intra-op Plan:   Post-operative Plan: Extubation in OR  Informed Consent: I have reviewed the patients History and Physical, chart, labs and discussed the procedure including the risks, benefits and alternatives for the proposed anesthesia with the patient or authorized representative who has indicated his/her understanding and acceptance.     Dental advisory given  Plan  Discussed with: CRNA  Anesthesia Plan Comments:         Anesthesia Quick Evaluation

## 2020-08-04 NOTE — Transfer of Care (Signed)
Immediate Anesthesia Transfer of Care Note  Patient: Tonya Bartlett  Procedure(s) Performed: OPEN REDUCTION INTERNAL FIXATION (ORIF) ANKLE FRACTURE (Right: Ankle)  Patient Location: PACU  Anesthesia Type:General  Level of Consciousness: sedated  Airway & Oxygen Therapy: Patient Spontanous Breathing and Patient connected to face mask oxygen  Post-op Assessment: Report given to RN and Post -op Vital signs reviewed and stable  Post vital signs: Reviewed and stable  Last Vitals:  Vitals Value Taken Time  BP 144/55 08/04/20 0942  Temp    Pulse 70 08/04/20 0943  Resp 16 08/04/20 0943  SpO2 90 % 08/04/20 0943  Vitals shown include unvalidated device data.  Last Pain:  Vitals:   08/04/20 0630  TempSrc: Oral  PainSc:       Patients Stated Pain Goal: 3 (96/75/91 6384)  Complications: No notable events documented.

## 2020-08-04 NOTE — Interval H&P Note (Signed)
History and Physical Interval Note:  08/04/2020 7:41 AM  Tonya Bartlett  has presented today for surgery, with the diagnosis of right Ankle fracture.  The various methods of treatment have been discussed with the patient and family. After consideration of risks, benefits and other options for treatment, the patient has consented to open treatment of her right ankle fracture with internal fixation as a surgical intervention.  The patient's history has been reviewed, patient examined, no change in status, stable for surgery.  I have reviewed the patient's chart and labs.  Questions were answered to the patient's satisfaction.    The risks and benefits of the alternative treatment options have been discussed in detail.  The patient wishes to proceed with surgery and specifically understands risks of bleeding, infection, nerve damage, blood clots, need for additional surgery, amputation and death.    Wylene Simmer

## 2020-08-04 NOTE — Progress Notes (Signed)
Report called to Santiago Glad in the OR, transport to take patient down at 0645.

## 2020-08-04 NOTE — Plan of Care (Signed)
  Problem: Education: Goal: Knowledge of General Education information will improve Description: Including pain rating scale, medication(s)/side effects and non-pharmacologic comfort measures Outcome: Progressing   Problem: Activity: Goal: Risk for activity intolerance will decrease Outcome: Progressing   Problem: Pain Managment: Goal: General experience of comfort will improve Outcome: Progressing   

## 2020-08-04 NOTE — Op Note (Signed)
07/31/2020 - 08/04/2020  9:40 AM  PATIENT:  Tonya Bartlett  75 y.o. female  PRE-OPERATIVE DIAGNOSIS: 1.  Right ankle trimalleolar fracture       POST-OPERATIVE DIAGNOSIS: 1.  Right ankle trimalleolar fracture      2.  Right ankle syndesmosis disruption  Procedure(s): 1.  Open treatment of right ankle trimalleolar fracture with internal fixation including fixation of the posterior lip 2.  Stress examination of the right ankle under fluoroscopy 3.  Open treatment of right ankle syndesmosis disruption with internal fixation 4.  AP, mortise and lateral radiographs of the right ankle  SURGEON:  Wylene Simmer, MD  ASSISTANT: Mechele Claude, PA-C  ANESTHESIA:   General, regional  EBL:  minimal   TOURNIQUET: 1 hour and 15 minutes at 350 mmHg thigh  COMPLICATIONS:  None apparent  DISPOSITION:  Extubated, awake and stable to recovery.  INDICATION FOR PROCEDURE: The patient is a 75 year old female without significant past medical history.  She fell a few days ago injuring her right ankle.  She has a displaced trimalleolar fracture and possible syndesmosis injury.  She presents today for surgical treatment of this displaced and unstable right ankle injury.  The risks and benefits of the alternative treatment options have been discussed in detail.  The patient wishes to proceed with surgery and specifically understands risks of bleeding, infection, nerve damage, blood clots, need for additional surgery, amputation and death.   PROCEDURE IN DETAIL:  After pre operative consent was obtained, and the correct operative site was identified, the patient was brought to the operating room and placed supine on the OR table.  Anesthesia was administered.  Pre-operative antibiotics were administered.  A surgical timeout was taken.  The right lower extremity was prepped and draped in standard sterile fashion with a tourniquet around the thigh.  The extremity was elevated and the tourniquet was inflated to 350  mmHg.  Longitudinal incision was made over the lateral malleolus.  Dissection was carried sharply down through the subcutaneous tissue to the fracture site.  Fracture was carefully examined.  There was extensive comminution both anteriorly and posteriorly.  An attempt was made at reduction but there was insufficient bone remaining to allow maintenance of reduction with bone clamps.  A [employer] Alps composite plate 8 holes in length was selected.  It was contoured to fit the lateral malleolus.  It was secured to the lateral malleolus with 3 locking screws.  The plate was then used as a reduction aid.  The fibula was pulled out to length and the plate provisionally pinned to the fibula.  Radiographs showed appropriate restoration of the length.  The oblong hole was then drilled in the plate, and a nonlocking screw inserted..  Again a radiograph confirmed appropriate restoration of the length of the fibula and appropriate reduction at the fracture site.  The plate was then secured proximally with locking and nonlocking screws.  AP, mortise and lateral radiographs confirmed appropriate reduction of the fibula and appropriate position and length of the hardware.  The posterior malleolus fracture was identified.  A Weber tenaculum was placed through the interval between the peroneals and the Achilles.  It was used to secure the posterior malleolus fragment and compressed appropriately.  A guidepin was inserted from anterior to posterior.  Radiographs confirmed appropriate position of the wire across the fracture site.  A small incision was made anteriorly over the wire.  Blunt dissection was carried down to the anterior cortex of the tibia.  The wire  was overdrilled.  A partially-threaded 4 oh cannulated screw from the [employer] small frag set was inserted.  It had appropriate purchase across the posterior fracture site.  Attention was turned to the medial side of the ankle.  A longitudinal incision was made  over the fracture site.  Dissection was carried sharply down through the subcutaneous tissues.  Care was taken to protect the saphenous nerve and vein.  The fracture site was identified.  Was cleaned of all hematoma.  It had a large anteromedial vertical shear component.  A 2 hole one third tubular plate was placed across the apex of the fracture after reducing.  It was secured proximally with a 3.5 mm fully threaded screw.  2 K wires were then inserted from the tip of the medial malleolus into the metaphyseal bone of the distal tibia.  Radiographs confirmed appropriate position of both wires.  Both were overdrilled and 4 mm partially-threaded screws were inserted.  Both were noted to have adequate purchase.  A hole was then drilled in the distal end of the 2 hole plate.  Another 3.5 mm fully threaded screw was inserted and was noted to have excellent purchase.  AP, lateral and mortise radiographs confirmed appropriate reduction of the medial, lateral and posterior malleolus fractures.  Stress examination was then performed.  There was instability noted at the syndesmosis.  The decision was made to proceed with syndesmosis fixation.  The syndesmosis was reduced and held with a Weber tenaculum.  A drill hole was made across 3 cortices of the distal fibula and tibia.  A 3.5 mm fully threaded screw was inserted and seated at the plate securely.  Final AP, lateral and mortise radiographs confirmed appropriate reduction of the fractures and the syndesmosis.  Hardware is appropriately positioned and of the appropriate lengths.  No other acute injuries are noted.  Wounds were irrigated copiously and sprinkled with vancomycin powder.  Subcutaneous tissues were approximated with Vicryl.  Skin incisions were closed with nylon.  Sterile dressings were applied followed by a well-padded short leg splint.  The tourniquet was released after application of the dressings.  The patient was awakened from anesthesia and  transported to the recovery room in stable condition.   FOLLOW UP PLAN: Nonweightbearing on the right lower extremity.  Follow-up in the office in 2 to 3 weeks for suture removal and conversion to a short leg cast.  Plan 6 weeks nonweightbearing immobilization.  Patient will likely require SNF placement since she lives alone.  Lovenox for DVT prophylaxis while an inpatient and then aspirin upon discharge.   RADIOGRAPHS: AP, lateral and mortise radiographs of the right ankle are obtained intraoperatively.  These show interval reduction and fixation of the trimalleolar ankle fracture and syndesmosis disruption.  Hardware is appropriately positioned and of the appropriate lengths.    Mechele Claude PA-C was present and scrubbed for the duration of the operative case. His assistance was essential in positioning the patient, prepping and draping, gaining and maintaining exposure, performing the operation, closing and dressing the wounds and applying the splint.

## 2020-08-04 NOTE — Plan of Care (Signed)
  Problem: Coping: Goal: Level of anxiety will decrease Outcome: Progressing   Problem: Pain Managment: Goal: General experience of comfort will improve Outcome: Progressing   

## 2020-08-05 ENCOUNTER — Encounter (HOSPITAL_COMMUNITY): Payer: Self-pay | Admitting: Orthopedic Surgery

## 2020-08-05 DIAGNOSIS — S82851A Displaced trimalleolar fracture of right lower leg, initial encounter for closed fracture: Secondary | ICD-10-CM | POA: Diagnosis not present

## 2020-08-05 LAB — BASIC METABOLIC PANEL
Anion gap: 6 (ref 5–15)
BUN: 13 mg/dL (ref 8–23)
CO2: 28 mmol/L (ref 22–32)
Calcium: 8.9 mg/dL (ref 8.9–10.3)
Chloride: 105 mmol/L (ref 98–111)
Creatinine, Ser: 0.59 mg/dL (ref 0.44–1.00)
GFR, Estimated: >60 mL/min (ref >60)
Glucose, Bld: 110 mg/dL — ABNORMAL HIGH (ref 70–99)
Potassium: 3.6 mmol/L (ref 3.5–5.1)
Sodium: 139 mmol/L (ref 135–145)

## 2020-08-05 LAB — CBC
HCT: 34.7 % — ABNORMAL LOW (ref 36.0–46.0)
Hemoglobin: 11.6 g/dL — ABNORMAL LOW (ref 12.0–15.0)
MCH: 33.9 pg (ref 26.0–34.0)
MCHC: 33.4 g/dL (ref 30.0–36.0)
MCV: 101.5 fL — ABNORMAL HIGH (ref 80.0–100.0)
Platelets: 267 10*3/uL (ref 150–400)
RBC: 3.42 MIL/uL — ABNORMAL LOW (ref 3.87–5.11)
RDW: 12.2 % (ref 11.5–15.5)
WBC: 9.8 10*3/uL (ref 4.0–10.5)
nRBC: 0 % (ref 0.0–0.2)

## 2020-08-05 MED ORDER — OXYCODONE HCL 5 MG PO TABS: 5.0000 mg | ORAL_TABLET | ORAL | 0 refills | Status: AC | PRN

## 2020-08-05 MED ORDER — SENNA 8.6 MG PO TABS: 2.0000 | ORAL_TABLET | Freq: Two times a day (BID) | ORAL | 0 refills | Status: DC

## 2020-08-05 MED ORDER — METHOCARBAMOL 500 MG PO TABS
500.0000 mg | ORAL_TABLET | Freq: Three times a day (TID) | ORAL | Status: DC | PRN
  Administered 2020-08-05 – 2020-08-06 (×3): 500 mg via ORAL
  Filled 2020-08-05 (×3): qty 1

## 2020-08-05 MED ORDER — DOCUSATE SODIUM 100 MG PO CAPS: 100.0000 mg | ORAL_CAPSULE | Freq: Two times a day (BID) | ORAL | 0 refills | Status: DC

## 2020-08-05 MED ORDER — ONDANSETRON HCL 4 MG PO TABS: 4.0000 mg | ORAL_TABLET | Freq: Every day | ORAL | 0 refills | Status: DC | PRN

## 2020-08-05 MED ORDER — RIVAROXABAN 10 MG PO TABS: 10.0000 mg | ORAL_TABLET | Freq: Every day | ORAL | 0 refills | Status: DC

## 2020-08-05 NOTE — Evaluation (Signed)
Physical Therapy Evaluation Patient Details Name: Tonya Bartlett MRN: [id number] DOB: Dec 12, 1945 Today's Date: 08/05/2020   History of Present Illness  Patient s/p ORIF of Right ankle. 75 year old with past medical history significant for GERD, diverticular disease, depression, ADD, migraine headaches, irritable bowel syndrome, hyperlipidemia, essential hypertension, previous history of total hip replacement who presents after mechanical fall with twisting of her right ankle.  Clinical Impression  Pt admitted with above diagnosis.  Pt independent at baseline. Extremely unsteady with bed to chair transfers. Endorses strong hx of recent falls (5x in 6wks). Recommend SNF post acute to maximize safety and independence.   Pt currently with functional limitations due to the deficits listed below (see PT Problem List). Pt will benefit from skilled PT to increase their independence and safety with mobility to allow discharge to the venue listed below.       Follow Up Recommendations SNF    Equipment Recommendations  Rolling walker with 5" wheels;Wheelchair (measurements PT)    Recommendations for Other Services       Precautions / Restrictions Precautions Precautions: Fall Restrictions Weight Bearing Restrictions: Yes RLE Weight Bearing: Non weight bearing      Mobility  Bed Mobility Overal bed mobility: Needs Assistance Bed Mobility: Sit to Supine     Supine to sit: Supervision;HOB elevated Sit to supine: Min guard   General bed mobility comments: for safety, able to lift RLE into bed with incr time    Transfers Overall transfer level: Needs assistance Equipment used: Rolling walker (2 wheeled) Transfers: Sit to/from [facility] Sit to Stand: Min assist Stand pivot transfers: Min assist;Mod assist       General transfer comment: assist to rise from chair and transition to RW. uncontrolled descent on to bed, multi-modal cues for technique, hand placement,  safety,  NWB.  Ambulation/Gait                Stairs            Wheelchair Mobility    Modified Rankin (Stroke Patients Only)       Balance Overall balance assessment: Needs assistance Sitting-balance support: No upper extremity supported Sitting balance-Leahy Scale: Good     Standing balance support: Bilateral upper extremity supported Standing balance-Leahy Scale: Poor Standing balance comment: reliant on walker, unsteady in standing. close min/guard for static stand with UE support                             Pertinent Vitals/Pain Pain Assessment: 0-10 Pain Score: 5  Pain Location: R ankle Pain Descriptors / Indicators: Dull;Aching Pain Intervention(s): Limited activity within patient's tolerance;Monitored during session;Premedicated before session;Repositioned    Home Living Family/patient expects to be discharged to:: Skilled nursing facility Living Arrangements: Alone                    Prior Function Level of Independence: Independent         Comments: 7-8 steps to get inside home.     Hand Dominance   Dominant Hand: Right    Extremity/Trunk Assessment   Upper Extremity Assessment Upper Extremity Assessment: Defer to OT evaluation RUE Deficits / Details: WFL ROM, 4-/5 shoulder strength, elbow 4/5 strength, wrist 4/5 strength, grip 3+/5 (history of hand surgery) RUE Sensation: WNL RUE Coordination: WNL LUE Deficits / Details: WFL ROM, 4-/5 shoulder strength, elbow 4/5 strength, wrist 5/5 strength, grip 55 LUE Sensation: WNL LUE Coordination: WNL  Lower Extremity Assessment Lower Extremity Assessment: LLE deficits/detail RLE Deficits / Details: lower leg splint; knee and hip grossly 2+/5 RLE: Unable to fully assess due to immobilization LLE Deficits / Details: grossly 3+ to 4/5    Cervical / Trunk Assessment Cervical / Trunk Assessment: Normal  Communication   Communication: No difficulties  Cognition  Arousal/Alertness: Awake/alert Behavior During Therapy: WFL for tasks assessed/performed Overall Cognitive Status: Within Functional Limits for tasks assessed                                        General Comments      Exercises     Assessment/Plan    PT Assessment Patient needs continued PT services  PT Problem List Decreased strength;Decreased activity tolerance;Decreased mobility;Decreased knowledge of use of DME;Decreased balance;Obesity;Decreased coordination       PT Treatment Interventions DME instruction;Therapeutic activities;Functional mobility training;Balance training;Therapeutic exercise;Patient/family education    PT Goals (Current goals can be found in the Care Plan section)  Acute Rehab PT Goals Patient Stated Goal: to get to bathroom PT Goal Formulation: With patient Time For Goal Achievement: 08/19/20 Potential to Achieve Goals: Good    Frequency Min 3X/week   Barriers to discharge        Co-evaluation               AM-PAC PT "6 Clicks" Mobility  Outcome Measure Help needed turning from your back to your side while in a flat bed without using bedrails?: A Little Help needed moving from lying on your back to sitting on the side of a flat bed without using bedrails?: A Little Help needed moving to and from a bed to a chair (including a wheelchair)?: A Lot Help needed standing up from a chair using your arms (e.g., wheelchair or bedside chair)?: A Lot Help needed to walk in hospital room?: Total Help needed climbing 3-5 steps with a railing? : Total 6 Click Score: 12    End of Session Equipment Utilized During Treatment: Gait belt Activity Tolerance: Patient tolerated treatment well Patient left: in bed;with call bell/phone within reach;with bed alarm set Nurse Communication: Mobility status PT Visit Diagnosis: Other abnormalities of gait and mobility (R26.89);Muscle weakness (generalized) (M62.81)    Time: 2641-5830 PT Time  Calculation (min) (ACUTE ONLY): 25 min   Charges:   PT Evaluation $PT Eval Low Complexity: 1 Low PT Treatments $Therapeutic Activity: 8-22 mins        Baxter Flattery, PT  Acute Rehab Dept ([facility]) [phone] Pager [phone]  08/05/2020   [facility] 08/05/2020, 11:34 AM

## 2020-08-05 NOTE — NC FL2 (Signed)
[place] LEVEL OF CARE SCREENING TOOL     IDENTIFICATION  Patient Name: Tonya Bartlett Birthdate: 1945-05-24 Sex: female Admission Date (Current Location): 07/31/2020  [facility] and [place] Number:  Herbalist and Address:  [facility],  [place], [place]      Provider Number: [id number]  Attending Physician Name and Address:  Elmarie Shiley, MD  Relative Name and Phone Number:  Philipp Deputy (daughter) Ph: [phone]    Current Level of Care: Hospital Recommended Level of Care: [place] Prior Approval Number:    Date Approved/Denied:   PASRR Number: Pending  Discharge Plan: SNF    Current Diagnoses: Patient Active Problem List   Diagnosis Date Noted   Trimalleolar fracture 07/31/2020   Right hip OA 08/01/2019   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   Insomnia 02/03/2019   Onychomycosis 11/28/2018   Obese 05/05/2017   S/P left THA, 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   MCI (mild cognitive impairment) 09/19/2015   Memory deficit 09/19/2015   SIRS (systemic inflammatory response syndrome) ([place]) 10/29/2014   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   Depression 11/16/2013   ADD (attention deficit disorder) 11/17/2011   Essential hypertension, benign 10/15/2009   SEBORRHEIC KERATOSIS, INFLAMED 10/17/2008   CARPAL TUNNEL SYNDROME, LEFT 06/22/2008   Allergic rhinitis 08/30/2007   Hyperlipidemia 11/11/2006   Migraine 11/11/2006   Osteoarthritis 11/11/2006    Orientation RESPIRATION BLADDER Height & Weight     Self, Time, Situation, Place  Normal Continent Weight: 220 lb 7.4 oz (100 kg) Height:  5\' 5"  (165.1 cm)  BEHAVIORAL SYMPTOMS/MOOD NEUROLOGICAL BOWEL NUTRITION STATUS      Continent Diet (Regular diet)  AMBULATORY STATUS COMMUNICATION OF NEEDS Skin   Extensive Assist Verbally Surgical wounds, Skin abrasions (Abrasion: head)                       Personal Care Assistance Level  of Assistance  Bathing, Feeding, Dressing Bathing Assistance: Limited assistance Feeding assistance: Independent Dressing Assistance: Limited assistance     Functional Limitations Info  Sight, Hearing, Speech Sight Info: Impaired Hearing Info: Adequate Speech Info: Adequate    SPECIAL CARE FACTORS FREQUENCY  PT (By licensed PT), OT (By licensed OT)     PT Frequency: 5x's/week OT Frequency: 5x's/week            Contractures Contractures Info: Not present    Additional Factors Info  Code Status, Allergies Code Status Info: Full Allergies Info: Otezla (Apremilast), Penicillins, Erythromycin, Morphine And Related, Nsaids, Remicade (Infliximab), Tolmetin, Nickel           Current Medications (08/05/2020):  This is the current hospital active medication list Current Facility-Administered Medications  Medication Dose Route Frequency Provider Last Rate Last Admin   0.9 %  sodium chloride infusion   Intravenous Continuous Corky Sing, PA-C   Stopped at 08/05/20 3762   acetaminophen (TYLENOL) tablet 325-650 mg  325-650 mg Oral Q6H PRN Corky Sing, PA-C       aprepitant (EMEND) capsule 40 mg  40 mg Oral Once Woodrum, Chelsey L, MD       betamethasone dipropionate 0.05 % lotion   Topical BID Corky Sing, [place]   Given at 08/04/20 1949   cholecalciferol (VITAMIN D3) tablet 5,000 Units  5,000 Units Oral QPM Corky Sing, PA-C   5,000 Units at 08/04/20 1735   diphenhydrAMINE (BENADRYL) 12.5 MG/5ML elixir 12.5-25 mg  12.5-25 mg  Oral Q4H PRN Corky Sing, PA-C   25 mg at 08/04/20 1945   enoxaparin (LOVENOX) injection 40 mg  40 mg Subcutaneous Q24H Corky Sing, [place]   40 mg at 08/05/20 4158   escitalopram (LEXAPRO) tablet 15 mg  15 mg Oral Daily Corky Sing, [place]   15 mg at 08/05/20 1059   famotidine (PEPCID) tablet 20 mg  20 mg Oral Daily PRN Corky Sing, PA-C       hydrALAZINE (APRESOLINE) tablet 25 mg  25 mg Oral Q6H PRN Corky Sing, PA-C   25 mg at 08/05/20 0103   HYDROmorphone (DILAUDID) injection 0.5-1 mg  0.5-1 mg Intravenous Q4H PRN Corky Sing, PA-C       losartan (COZAAR) tablet 50 mg  50 mg Oral Daily Corky Sing, PA-C   50 mg at 08/05/20 1059   [START ON 08/06/2020] methotrexate (RHEUMATREX) tablet 15 mg  15 mg Oral Weekly Corky Sing, PA-C       mupirocin ointment (BACTROBAN) 2 % 1 application  1 application Nasal BID Corky Sing, [place]   1 application at 30/94/07 1100   neomycin-bacitracin-polymyxin (NEOSPORIN) ointment   Topical BID Corky Sing, [place]   1 application at 68/08/81 1100   ondansetron (ZOFRAN) tablet 4 mg  4 mg Oral Q6H PRN Corky Sing, PA-C       Or   ondansetron [facility]) injection 4 mg  4 mg Intravenous Q6H PRN Corky Sing, PA-C       oxyCODONE (Oxy IR/ROXICODONE) immediate release tablet 10-15 mg  10-15 mg Oral Q4H PRN Corky Sing, PA-C   10 mg at 08/05/20 1031   oxyCODONE (Oxy IR/ROXICODONE) immediate release tablet 5-10 mg  5-10 mg Oral Q4H PRN Corky Sing, PA-C   10 mg at 08/05/20 0439   polyethylene glycol (MIRALAX / GLYCOLAX) packet 17 g  17 g Oral BID Corky Sing, PA-C   17 g at 08/05/20 1100   senna (SENOKOT) tablet 8.6 mg  1 tablet Oral BID Corky Sing, PA-C   8.6 mg at 08/05/20 1059   senna-docusate (Senokot-S) tablet 1 tablet  1 tablet Oral BID Corky Sing, PA-C   1 tablet at 08/05/20 1059   sulfamethoxazole-trimethoprim (BACTRIM) 400-80 MG per tablet 1 tablet  1 tablet Oral Daily Corky Sing, PA-C   1 tablet at 08/05/20 1059   SUMAtriptan (IMITREX) tablet 100 mg  100 mg Oral Q2H PRN Corky Sing, PA-C   50 mg at 08/04/20 2141   traZODone (DESYREL) tablet 50 mg  50 mg Oral QHS PRN Corky Sing, PA-C   50 mg at 08/04/20 2141     Discharge Medications: Please see discharge summary for a list of discharge medications.  Relevant Imaging Results:  Relevant Lab Results:   Additional  Information SSN: [id number]  Sherie Don, LCSW

## 2020-08-05 NOTE — Progress Notes (Addendum)
PROGRESS NOTE    Tonya Bartlett  XNA:[id number] DOB: 03-May-1945 DOA: 07/31/2020 PCP: [name], NP   Brief Narrative: 75 year old with past medical history significant for GERD, diverticular disease, depression, ADD, migraine headaches, irritable bowel syndrome, hyperlipidemia, essential hypertension, previous history of total hip replacement who presents after mechanical fall with twisting of her right ankle.  Evaluation in the ED x-ray of the right ankle show 3 malleoli fracture with dislocation.  CT head without contrast was negative.  CT cervical spine negative. Patient was admitted for surgical management.   Assessment & Plan:   Principal Problem:   Trimalleolar fracture Active Problems:   Hyperlipidemia   Essential hypertension, benign   S/P left THA, AA   Obese  1-Trimalleolar Fracture: S/p mechanical fall:  -She underwent closed reduction and splinting in the ED -CT: post reduction; Residual displacement of trimalleolar fracture as described. The tibiotalar dislocation has been reduced, although there is mild residual widening of the ankle mortise and tibiotalar joint space widening anteromedially with interposed small fracture fragments.  No evidence of tarsal bone fracture. Moderate midfoot degenerative changes. -On dilaudid, decrease to 0.5 to avoid oversedation. Started oral Vicodin PRN.  -Underwent; open treatment of right ankle trimalleolar fracture with internal fixation of the posterior lip. 6/19 -Miralax for bowel regimen.  -PT , OT.  Patient Will require SNF for Rehab.  Lovenox for DVT prophylaxis, Xarelto at discharge.   2-HTN;  Continue with losartan.   3-HLD; Continue with statins.   4-Morbid Obesity:  Needs life style modification.   5-Hypokalemia; replete orally.  6-Head scalp laceration; staple in place.  neosporin. Needs removed 7 to 10 days.   Estimated body mass index is 36.69 kg/m as calculated from the following:   Height as of this  encounter: 5\' 5"  (1.651 m).   Weight as of this encounter: 100 kg.   DVT prophylaxis: Lovenox  Code Status: Full code Family Communication: care discussed with patient Disposition Plan:  Status is: Inpatient  Remains inpatient appropriate because:IV treatments appropriate due to intensity of illness or inability to take PO  Dispo: The patient is from: Home              Anticipated d/c is to:  Might need SNF              Patient currently is not medically stable to d/c.   Difficult to place patient No        Consultants:  Ortho  Procedures:  none  Antimicrobials:    Subjective: She is complaining of sharp pain ankle travel to leg.  Had BM night prior to Sx.  Her pain is controled with pain meds.    Objective: Vitals:   08/04/20 2106 08/05/20 0100 08/05/20 0508 08/05/20 1224  BP: (!) 163/90 (!) 178/85 (!) 161/59 (!) 142/68  Pulse: 90 73 62 75  Resp: 17 17 17 17   Temp: 98.9 F (37.2 C) 98.7 F (37.1 C) 98.3 F (36.8 C) 98 F (36.7 C)  TempSrc: Oral Oral Oral Oral  SpO2: 97% 93% 96% 99%  Weight:      Height:        Intake/Output Summary (Last 24 hours) at 08/05/2020 1258 Last data filed at 08/05/2020 0600 Gross per 24 hour  Intake 1657.5 ml  Output 3100 ml  Net -1442.5 ml    Filed Weights   08/01/20 0021  Weight: 100 kg    Examination:  General exam: NAD Respiratory system: CTA Cardiovascular system:  S 1, S  2 RRR Gastrointestinal system: BS present, soft, nt Central nervous system: Non focal.  Extremities: RLE with dressing    Data Reviewed: I have personally reviewed following labs and imaging studies  CBC: Recent Labs  Lab 07/31/20 1706 08/01/20 0105 08/04/20 1022 08/05/20 0917  WBC 8.2 9.6 6.1 9.8  NEUTROABS 6.1  --   --   --   HGB 12.0 11.9* 11.2* 11.6*  HCT 35.6* 35.4* 33.9* 34.7*  MCV 99.2 100.3* 102.1* 101.5*  PLT 242 240 245 888    Basic Metabolic Panel: Recent Labs  Lab 07/31/20 1706 08/01/20 0105 08/02/20 0946  08/04/20 1022 08/05/20 0917  NA 138 136 136 136 139  K 3.5 3.4* 3.7 4.0 3.6  CL 107 104 102 101 105  CO2 25 25 28 29 28   GLUCOSE 109* 113* 107* 129* 110*  BUN 13 13 7* 12 13  CREATININE 0.60 0.55  0.48 0.64 0.78 0.59  CALCIUM 9.0 8.9 8.7* 8.7* 8.9    GFR: Estimated Creatinine Clearance: 71.2 mL/min (by C-G formula based on SCr of 0.59 mg/dL). Liver Function Tests: Recent Labs  Lab 07/31/20 1706 08/01/20 0105  AST 23 22  ALT 24 22  ALKPHOS 60 61  BILITOT 0.6 0.8  PROT 6.8 6.7  ALBUMIN 3.8 4.0    No results for input(s): LIPASE, AMYLASE in the last 168 hours. No results for input(s): AMMONIA in the last 168 hours. Coagulation Profile: No results for input(s): INR, PROTIME in the last 168 hours. Cardiac Enzymes: No results for input(s): CKTOTAL, CKMB, CKMBINDEX, TROPONINI in the last 168 hours. BNP (last 3 results) No results for input(s): PROBNP in the last 8760 hours. HbA1C: No results for input(s): HGBA1C in the last 72 hours. CBG: No results for input(s): GLUCAP in the last 168 hours. Lipid Profile: No results for input(s): CHOL, HDL, LDLCALC, TRIG, CHOLHDL, LDLDIRECT in the last 72 hours. Thyroid Function Tests: No results for input(s): TSH, T4TOTAL, FREET4, T3FREE, THYROIDAB in the last 72 hours. Anemia Panel: No results for input(s): VITAMINB12, FOLATE, FERRITIN, TIBC, IRON, RETICCTPCT in the last 72 hours. Sepsis Labs: No results for input(s): PROCALCITON, LATICACIDVEN in the last 168 hours.  Recent Results (from the past 240 hour(s))  Resp Panel by RT-PCR (Flu A&B, Covid) Nasopharyngeal Swab     Status: None   Collection Time: 07/31/20  7:44 PM   Specimen: Nasopharyngeal Swab; Nasopharyngeal(NP) swabs in vial transport medium  Result Value Ref Range Status   SARS Coronavirus 2 by RT PCR NEGATIVE NEGATIVE Final    Comment: (NOTE) SARS-CoV-2 target nucleic acids are NOT DETECTED.  The SARS-CoV-2 RNA is generally detectable in upper respiratory specimens  during the acute phase of infection. The lowest concentration of SARS-CoV-2 viral copies this assay can detect is 138 copies/mL. A negative result does not preclude SARS-Cov-2 infection and should not be used as the sole basis for treatment or other patient management decisions. A negative result may occur with  improper specimen collection/handling, submission of specimen other than nasopharyngeal swab, presence of viral mutation(s) within the areas targeted by this assay, and inadequate number of viral copies(<138 copies/mL). A negative result must be combined with clinical observations, patient history, and epidemiological information. The expected result is Negative.  Fact Sheet for Patients:  EntrepreneurPulse.com.au  Fact Sheet for Healthcare Providers:  IncredibleEmployment.be  This test is no t yet approved or cleared by the [place] FDA and  has been authorized for detection and/or diagnosis of SARS-CoV-2 by FDA under an Emergency  Use Authorization (EUA). This EUA will remain  in effect (meaning this test can be used) for the duration of the COVID-19 declaration under Section 564(b)(1) of the Act, 21 U.S.C.section 360bbb-3(b)(1), unless the authorization is terminated  or revoked sooner.       Influenza A by PCR NEGATIVE NEGATIVE Final   Influenza B by PCR NEGATIVE NEGATIVE Final    Comment: (NOTE) The Xpert Xpress SARS-CoV-2/FLU/RSV plus assay is intended as an aid in the diagnosis of influenza from Nasopharyngeal swab specimens and should not be used as a sole basis for treatment. Nasal washings and aspirates are unacceptable for Xpert Xpress SARS-CoV-2/FLU/RSV testing.  Fact Sheet for Patients: EntrepreneurPulse.com.au  Fact Sheet for Healthcare Providers: IncredibleEmployment.be  This test is not yet approved or cleared by the [place] FDA and has been authorized for detection  and/or diagnosis of SARS-CoV-2 by FDA under an Emergency Use Authorization (EUA). This EUA will remain in effect (meaning this test can be used) for the duration of the COVID-19 declaration under Section 564(b)(1) of the Act, 21 U.S.C. section 360bbb-3(b)(1), unless the authorization is terminated or revoked.  Performed at [facility], [place] [address], [place], [place] [address]   Surgical PCR screen     Status: None   Collection Time: 08/03/20  3:30 PM   Specimen: Nasal Mucosa; Nasal Swab  Result Value Ref Range Status   MRSA, PCR NEGATIVE NEGATIVE Final   Staphylococcus aureus NEGATIVE NEGATIVE Final    Comment: (NOTE) The Xpert SA Assay (FDA approved for NASAL specimens in patients 49 years of age and older), is one component of a comprehensive surveillance program. It is not intended to diagnose infection nor to guide or monitor treatment. Performed at [facility], [place] [address], [place], [place] [address]           Radiology Studies: No results found.      Scheduled Meds:  aprepitant  40 mg Oral Once   betamethasone dipropionate   Topical BID   cholecalciferol  5,000 Units Oral QPM   enoxaparin (LOVENOX) injection  40 mg Subcutaneous Q24H   escitalopram  15 mg Oral Daily   losartan  50 mg Oral Daily   [START ON 08/06/2020] methotrexate  15 mg Oral Weekly   mupirocin ointment  1 application Nasal BID   neomycin-bacitracin-polymyxin   Topical BID   polyethylene glycol  17 g Oral BID   senna  1 tablet Oral BID   senna-docusate  1 tablet Oral BID   sulfamethoxazole-trimethoprim  1 tablet Oral Daily   Continuous Infusions:  sodium chloride Stopped (08/05/20 0353)      LOS: 5 days    Time spent: 35 minutes.     Elmarie Shiley, MD [facility]   If 7PM-7AM, please contact night-coverage [email or web address]  08/05/2020, 12:58 PM

## 2020-08-05 NOTE — Care Management (Signed)
RE: Tonya Bartlett DOB: 05-14-1945 Date: 08/05/2020 MUST ID: [id number]    To Whom It May Concern:   Please be advised that the above name patient will require a short-term nursing home stay--anticipated 30 days or less rehabilitation and strengthening. The plan is for return home.

## 2020-08-05 NOTE — TOC Initial Note (Signed)
Transition of Care [facility]) - Initial/Assessment Note   Patient Details  Name: Tonya Bartlett MRN: [id number] Date of Birth: [birth year]  [facility]) CM/SW Contact:    Sherie Don, LCSW Phone Number: 08/05/2020, 11:30 AM  Clinical Narrative: PT and OT evaluations recommended SNF. CSW spoke with patient who is agreeable to SNF. Patient's first choice is [facility]. FL2 done; PASRR pending and requested clinicals have been uploaded for review. Initial referral and PT notes faxed out. TOC awaiting bed offers.  Expected Discharge Plan: Skilled Nursing Facility Barriers to Discharge: Continued Medical Work up, SNF Pending bed offer  Patient Goals and CMS Choice Patient states their goals for this hospitalization and ongoing recovery are:: Go to SNF CMS Medicare.gov Compare Post Acute Care list provided to:: Patient Choice offered to / list presented to : Patient  Expected Discharge Plan and Services Expected Discharge Plan: [place] In-house Referral: Clinical Social Work Post Acute Care Choice: [place] Living arrangements for the past 2 months: [place]              DME Arranged: N/A DME Agency: NA  Prior Living Arrangements/Services Living arrangements for the past 2 months: Single Family Home Lives with:: Self Patient language and need for interpreter reviewed:: Yes Do you feel safe going back to the place where you live?: Yes      Need for Family Participation in Patient Care: No (Comment) Care giver support system in place?: Yes (comment) Criminal Activity/Legal Involvement Pertinent to Current Situation/Hospitalization: No - Comment as needed  Activities of Daily Living Home Assistive Devices/Equipment: None ADL Screening (condition at time of admission) Patient's cognitive ability adequate to safely complete daily activities?: Yes Is the patient deaf or have difficulty hearing?: No Does the patient have difficulty  seeing, even when wearing glasses/contacts?: No Does the patient have difficulty concentrating, remembering, or making decisions?: No Patient able to express need for assistance with ADLs?: Yes Does the patient have difficulty dressing or bathing?: Yes Independently performs ADLs?: No Communication: Independent Dressing (OT): Needs assistance Is this a change from baseline?: Pre-admission baseline Grooming: Independent Feeding: Independent Bathing: Needs assistance Is this a change from baseline?: Pre-admission baseline Toileting: Needs assistance Is this a change from baseline?: Pre-admission baseline In/Out Bed: Needs assistance Is this a change from baseline?: Pre-admission baseline Walks in Home: Independent Is this a change from baseline?: Pre-admission baseline Does the patient have difficulty walking or climbing stairs?: Yes Weakness of Legs: Right Weakness of Arms/Hands: None  Permission Sought/Granted Permission sought to share information with : Other (comment) Permission granted to share information with : Yes, Verbal Permission Granted Permission granted to share info w AGENCY: SNFs  Emotional Assessment Appearance:: Appears stated age Attitude/Demeanor/Rapport: Engaged Affect (typically observed): Accepting Orientation: : Oriented to Self, Oriented to Place, Oriented to  Time, Oriented to Situation Alcohol / Substance Use: Not Applicable  Admission diagnosis:  Trimalleolar fracture [S82.853A] Pain [R52] Closed trimalleolar fracture of right ankle, initial encounter [S82.851A] Fall, initial encounter [W19.XXXA] Laceration of scalp, initial encounter [S01.01XA] Patient Active Problem List   Diagnosis Date Noted   Trimalleolar fracture 07/31/2020   Right hip OA 08/01/2019   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   Insomnia 02/03/2019   Onychomycosis 11/28/2018   Obese 05/05/2017   S/P left THA, 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   MCI (mild cognitive impairment) 09/19/2015    Memory deficit 09/19/2015   SIRS (systemic inflammatory response syndrome) ([place]) 10/29/2014   Ulnar neuropathy of left  upper extremity 04/13/2014   Depression 11/16/2013   ADD (attention deficit disorder) 11/17/2011   Essential hypertension, benign 10/15/2009   SEBORRHEIC KERATOSIS, INFLAMED 10/17/2008   CARPAL TUNNEL SYNDROME, LEFT 06/22/2008   Allergic rhinitis 08/30/2007   Hyperlipidemia 11/11/2006   Migraine 11/11/2006   Osteoarthritis 11/11/2006   PCP:  [name], NP Pharmacy:   [facility] [place], [place] - [address] AT [facility] [place] [address] Phone: [phone] Fax: [phone]  Readmission Risk Interventions No flowsheet data found.

## 2020-08-05 NOTE — Progress Notes (Signed)
Subjective: 1 Day Post-Op Procedure(s) (LRB): OPEN REDUCTION INTERNAL FIXATION (ORIF) ANKLE FRACTURE (Right)  Patient reports pain as mild to moderate.  Denies fever, chills, N/V, CP, SOB.  Tolerating POs well.  Admits to BM.  Hopeful to go to SNF.  Patient reports hx of previous DVT.  Objective:   VITALS:  Temp:  [97.8 F (36.6 C)-98.9 F (37.2 C)] 98.3 F (36.8 C) (06/20 0508) Pulse Rate:  [62-96] 62 (06/20 0508) Resp:  [11-19] 17 (06/20 0508) BP: (126-178)/(52-93) 161/59 (06/20 0508) SpO2:  [92 %-100 %] 96 % (06/20 0508)  General: WDWN patient in NAD. Psych:  Appropriate mood and affect. Neuro:  A&O x 3, Moving all extremities, sensation intact to light touch HEENT:  EOMs intact Chest:  Even non-labored respirations Skin:  SLS C/D/I, no rashes or lesions Extremities: warm/dry, no visible edema, erythema or echymosis.  No lymphadenopathy. Pulses: Popliteus 2+ MSK:  ROM: EHL/FHL intact, MMT: able to perform quad set   LABS Recent Labs    08/04/20 1022  HGB 11.2*  WBC 6.1  PLT 245   Recent Labs    08/02/20 0946 08/04/20 1022  NA 136 136  K 3.7 4.0  CL 102 101  CO2 28 29  BUN 7* 12  CREATININE 0.64 0.78  GLUCOSE 107* 129*   No results for input(s): LABPT, INR in the last 72 hours.   Assessment/Plan: 1 Day Post-Op Procedure(s) (LRB): OPEN REDUCTION INTERNAL FIXATION (ORIF) ANKLE FRACTURE (Right)  NWB R LE Up with therapy DVT ppx:  Lovenox in house; transition to Xarelto upon D/C Disp:  Likely SNF D/C scripts on chart.  Prior to prescribing the oxycodone I reviewed the patient's narcotic medical record in the PMP Aware database.  Mechele Claude PA-C [facility] Office:  [phone]

## 2020-08-05 NOTE — Evaluation (Signed)
Occupational Therapy Evaluation Patient Details Name: Tonya Bartlett MRN: [id number] DOB: Nov 27, 1945 Today's Date: 08/05/2020    History of Present Illness Patient s/p ORIF of Right ankle. 75 year old with past medical history significant for GERD, diverticular disease, depression, ADD, migraine headaches, irritable bowel syndrome, hyperlipidemia, essential hypertension, previous history of total hip replacement who presents after mechanical fall with twisting of her right ankle.   Clinical Impression   [name] is a 75 year old woman s/p right ankle ORIF with NWB status who presents with decreased Rom and strength of RLE and generalized upper body strength, impaired balance and complaints of pain resulting in a sudden decline in functional abilities. Patient requiring min assist for standing and transfers, fatigues quickly with a few short hops due to decreased upper body strength and reports ankle pain. Patient limited to seated position for UB ADLs and requring mod-max assist for LB ADLs and BSC for toileting. Patient will benefit from skilled OT services while in hospital to improve deficits and learn compensatory strategies as needed in order to improve independence. Recommend short term rehab at discharge to improve deficits.       Follow Up Recommendations  SNF    Equipment Recommendations  None recommended by OT    Recommendations for Other Services       Precautions / Restrictions Precautions Precautions: Fall Restrictions Weight Bearing Restrictions: Yes RLE Weight Bearing: Non weight bearing      Mobility Bed Mobility Overal bed mobility: Needs Assistance Bed Mobility: Supine to Sit     Supine to sit: Supervision;HOB elevated          Transfers Overall transfer level: Needs assistance Equipment used: Rolling walker (2 wheeled) Transfers: Sit to/from [facility] Sit to Stand: Min assist;From elevated surface Stand pivot transfers: Min  assist;From elevated surface            Balance Overall balance assessment: Needs assistance Sitting-balance support: No upper extremity supported Sitting balance-Leahy Scale: Good     Standing balance support: Bilateral upper extremity supported Standing balance-Leahy Scale: Poor Standing balance comment: reliant on walker                           ADL either performed or assessed with clinical judgement   ADL Overall ADL's : Needs assistance/impaired Eating/Feeding: Independent   Grooming: Sitting;Set up   Upper Body Bathing: Set up;Sitting   Lower Body Bathing: Moderate assistance;Sitting/lateral leans   Upper Body Dressing : Set up;Sitting   Lower Body Dressing: Maximal assistance;Sit to/from stand   Toilet Transfer: Minimal assistance;BSC;RW   Toileting- Clothing Manipulation and Hygiene: Maximal assistance;Sit to/from stand       Functional mobility during ADLs: Minimal assistance;Rolling walker       Vision Patient Visual Report: No change from baseline       Perception     Praxis      Pertinent Vitals/Pain Pain Assessment: 0-10 Pain Score: 5  Pain Descriptors / Indicators: Dull;Aching Pain Intervention(s): Monitored during session;Premedicated before session     Hand Dominance Right   Extremity/Trunk Assessment Upper Extremity Assessment Upper Extremity Assessment: RUE deficits/detail;LUE deficits/detail RUE Deficits / Details: WFL ROM, 4-/5 shoulder strength, elbow 4/5 strength, wrist 4/5 strength, grip 3+/5 (history of hand surgery) RUE Sensation: WNL RUE Coordination: WNL LUE Deficits / Details: WFL ROM, 4-/5 shoulder strength, elbow 4/5 strength, wrist 5/5 strength, grip 55 LUE Sensation: WNL LUE Coordination: WNL   Lower Extremity Assessment Lower  Extremity Assessment: Defer to PT evaluation   Cervical / Trunk Assessment Cervical / Trunk Assessment: Normal   Communication Communication Communication: No difficulties    Cognition Arousal/Alertness: Awake/alert Behavior During Therapy: WFL for tasks assessed/performed Overall Cognitive Status: Within Functional Limits for tasks assessed                                     General Comments       Exercises     Shoulder Instructions      Home Living Family/patient expects to be discharged to:: Skilled nursing facility Living Arrangements: Alone                                      Prior Functioning/Environment Level of Independence: Independent        Comments: 7-8 steps to get inside home.        OT Problem List: Decreased strength;Decreased activity tolerance;Impaired balance (sitting and/or standing);Decreased knowledge of use of DME or AE;Pain;Decreased knowledge of precautions;Obesity      OT Treatment/Interventions: Self-care/ADL training;Therapeutic exercise;DME and/or AE instruction;Therapeutic activities;Balance training;Patient/family education    OT Goals(Current goals can be found in the care plan section) Acute Rehab OT Goals Patient Stated Goal: to get to bathroom OT Goal Formulation: With patient Time For Goal Achievement: 08/19/20 Potential to Achieve Goals: Good  OT Frequency: Min 2X/week   Barriers to D/C:            Co-evaluation              AM-PAC OT "6 Clicks" Daily Activity     Outcome Measure Help from another person eating meals?: None Help from another person taking care of personal grooming?: A Little Help from another person toileting, which includes using toliet, bedpan, or urinal?: A Lot Help from another person bathing (including washing, rinsing, drying)?: A Lot Help from another person to put on and taking off regular upper body clothing?: A Little Help from another person to put on and taking off regular lower body clothing?: A Lot 6 Click Score: 16   End of Session Equipment Utilized During Treatment: Rolling walker Nurse Communication: Mobility  status  Activity Tolerance: Patient tolerated treatment well Patient left: in chair;with call bell/phone within reach;with chair alarm set  OT Visit Diagnosis: Unsteadiness on feet (R26.81);Other abnormalities of gait and mobility (R26.89);History of falling (Z91.81);Pain Pain - Right/Left: Right Pain - part of body: Ankle and joints of foot                Time: 5681-2751 OT Time Calculation (min): 21 min Charges:  OT General Charges $OT Visit: 1 Visit OT Evaluation $OT Eval Moderate Complexity: 1 Mod  Cyrah Mclamb, OTR/L [place]  Office [phone] Pager: [place] 08/05/2020, 10:41 AM

## 2020-08-05 NOTE — Care Management Important Message (Signed)
Important Message  Patient Details IM Letter given to the Patient. Name: Tonya Bartlett MRN: [id number] Date of Birth: November 22, 1945   Medicare Important Message Given:  Yes     Kerin Salen 08/05/2020, 1:42 PM

## 2020-08-06 DIAGNOSIS — M6281 Muscle weakness (generalized): Secondary | ICD-10-CM | POA: Diagnosis not present

## 2020-08-06 DIAGNOSIS — R41841 Cognitive communication deficit: Secondary | ICD-10-CM | POA: Diagnosis not present

## 2020-08-06 DIAGNOSIS — R531 Weakness: Secondary | ICD-10-CM | POA: Diagnosis not present

## 2020-08-06 DIAGNOSIS — W010XXA Fall on same level from slipping, tripping and stumbling without subsequent striking against object, initial encounter: Secondary | ICD-10-CM | POA: Diagnosis not present

## 2020-08-06 DIAGNOSIS — S0191XD Laceration without foreign body of unspecified part of head, subsequent encounter: Secondary | ICD-10-CM | POA: Diagnosis not present

## 2020-08-06 DIAGNOSIS — I69828 Other speech and language deficits following other cerebrovascular disease: Secondary | ICD-10-CM | POA: Diagnosis not present

## 2020-08-06 DIAGNOSIS — Z7401 Bed confinement status: Secondary | ICD-10-CM | POA: Diagnosis not present

## 2020-08-06 DIAGNOSIS — I10 Essential (primary) hypertension: Secondary | ICD-10-CM | POA: Diagnosis not present

## 2020-08-06 DIAGNOSIS — K219 Gastro-esophageal reflux disease without esophagitis: Secondary | ICD-10-CM | POA: Diagnosis not present

## 2020-08-06 DIAGNOSIS — E876 Hypokalemia: Secondary | ICD-10-CM | POA: Diagnosis not present

## 2020-08-06 DIAGNOSIS — R2681 Unsteadiness on feet: Secondary | ICD-10-CM | POA: Diagnosis not present

## 2020-08-06 DIAGNOSIS — M1611 Unilateral primary osteoarthritis, right hip: Secondary | ICD-10-CM | POA: Diagnosis not present

## 2020-08-06 DIAGNOSIS — E785 Hyperlipidemia, unspecified: Secondary | ICD-10-CM | POA: Diagnosis not present

## 2020-08-06 DIAGNOSIS — Z23 Encounter for immunization: Secondary | ICD-10-CM | POA: Diagnosis not present

## 2020-08-06 DIAGNOSIS — S0101XA Laceration without foreign body of scalp, initial encounter: Secondary | ICD-10-CM | POA: Diagnosis not present

## 2020-08-06 DIAGNOSIS — S82851D Displaced trimalleolar fracture of right lower leg, subsequent encounter for closed fracture with routine healing: Secondary | ICD-10-CM | POA: Diagnosis not present

## 2020-08-06 DIAGNOSIS — R5381 Other malaise: Secondary | ICD-10-CM | POA: Diagnosis not present

## 2020-08-06 DIAGNOSIS — Z20822 Contact with and (suspected) exposure to covid-19: Secondary | ICD-10-CM | POA: Diagnosis not present

## 2020-08-06 DIAGNOSIS — R262 Difficulty in walking, not elsewhere classified: Secondary | ICD-10-CM | POA: Diagnosis not present

## 2020-08-06 DIAGNOSIS — D509 Iron deficiency anemia, unspecified: Secondary | ICD-10-CM | POA: Diagnosis not present

## 2020-08-06 DIAGNOSIS — S82851A Displaced trimalleolar fracture of right lower leg, initial encounter for closed fracture: Secondary | ICD-10-CM | POA: Diagnosis not present

## 2020-08-06 DIAGNOSIS — Z9181 History of falling: Secondary | ICD-10-CM | POA: Diagnosis not present

## 2020-08-06 DIAGNOSIS — X501XXA Overexertion from prolonged static or awkward postures, initial encounter: Secondary | ICD-10-CM | POA: Diagnosis not present

## 2020-08-06 LAB — RESP PANEL BY RT-PCR (FLU A&B, COVID) ARPGX2
Influenza A by PCR: NEGATIVE
Influenza B by PCR: NEGATIVE
SARS Coronavirus 2 by RT PCR: NEGATIVE

## 2020-08-06 MED ORDER — POLYETHYLENE GLYCOL 3350 17 G PO PACK: 17.0000 g | PACK | Freq: Two times a day (BID) | ORAL | 0 refills | Status: DC

## 2020-08-06 MED ORDER — METHOCARBAMOL 500 MG PO TABS: 500.0000 mg | ORAL_TABLET | Freq: Three times a day (TID) | ORAL | 0 refills | Status: DC | PRN

## 2020-08-06 NOTE — Discharge Summary (Signed)
Physician Discharge Summary  Tonya Bartlett PJK:[id number] DOB: [birth year] DOA: 07/31/2020  PCP: [name], NP  Admit date: 07/31/2020 Discharge date: 08/06/2020  Admitted From: Home Disposition: SNF  Recommendations for Outpatient Follow-up:  Follow up with PCP in 1-2 weeks Please obtain BMP/CBC in one week Needs to follow up with ortho post Sx.     Discharge Condition: Stable.  CODE STATUS: Full code Diet recommendation: Heart Healthy    Brief/Interim Summary: 75 year old with past medical history significant for GERD, diverticular disease, depression, ADD, migraine headaches, irritable bowel syndrome, hyperlipidemia, essential hypertension, previous history of total hip replacement who presents after mechanical fall with twisting of her right ankle.   Evaluation in the ED x-ray of the right ankle show 3 malleoli fracture with dislocation.  CT head without contrast was negative.  CT cervical spine negative. Patient was admitted for surgical management.     1-Right Trimalleolar Fracture: S/p mechanical fall: -She underwent closed reduction and splinting in the ED -CT: post reduction; Residual displacement of trimalleolar fracture as described. The tibiotalar dislocation has been reduced, although there is mild residual widening of the ankle mortise and tibiotalar joint space widening anteromedially with interposed small fracture fragments.  No evidence of tarsal bone fracture. Moderate midfoot degenerative changes. -Underwent; open treatment of right ankle trimalleolar fracture with internal fixation of the posterior lip. 6/19 -Miralax for bowel regimen. Had Bowel movement.  -PT , OT. Patient Will require SNF for Rehab.  Received Lovenox for DVT prophylaxis while in the Hospital, Xarelto at discharge.  -Oxycodone and baclofen at discharge for pain management.   2-HTN; Continue with losartan.   3-HLD; Continue with statins.   4-Morbid Obesity: Needs life style  modification.   5-Hypokalemia; replaced.  6-Head scalp laceration; staple in place.  neosporin. Needs removed 7 to 10 days.   Estimated body mass index is 36.69 kg/m as calculated from the following:   Height as of this encounter: 5\' 5"  (1.651 m).   Weight as of this encounter: 100 kg.     Discharge Diagnoses:  Principal Problem:   Trimalleolar fracture Active Problems:   Hyperlipidemia   Essential hypertension, benign   S/P left THA, AA   Obese    Discharge Instructions  Discharge Instructions     Diet - low sodium heart healthy   Complete by: As directed    Discharge wound care:   Complete by: As directed    See above.   Increase activity slowly   Complete by: As directed       Allergies as of 08/06/2020       Reactions   Otezla [apremilast] Anaphylaxis   Suicidal ideation   Penicillins Anaphylaxis   Has patient had a PCN reaction causing immediate rash, facial/tongue/throat swelling, SOB or lightheadedness with hypotension: Yes Has patient had a PCN reaction causing severe rash involving mucus membranes or skin necrosis: Yes Has patient had a PCN reaction that required hospitalization: No Has patient had a PCN reaction occurring within the last 10 year No If all of the above answers are "NO", then may proceed with Cephalosporin use.   Erythromycin Other (See Comments)   SEVERE STOMACH CRAMPS   Morphine And Related Nausea And Vomiting   Nsaids Other (See Comments)   SEVERE STOMACH CRAMPS, MOUTH SORES **Able to tolerate Tylenol   Remicade [infliximab] Other (See Comments)   Shut down immune system-BP high   Tolmetin    SEVERE STOMACH CRAMPS   Nickel Rash   Including snaps on  hospital gowns         Medication List     STOP taking these medications    atorvastatin 20 MG tablet Commonly known as: LIPITOR   clindamycin 300 MG capsule Commonly known as: CLEOCIN       TAKE these medications    betamethasone dipropionate 0.05 % lotion APPLY  EXTERNALLY TO THE AFFECTED AREA DAILY   CAL-MAG-ZINC PO Take 1 tablet by mouth every evening.   CEREFOLIN PO Take 1 tablet by mouth daily.   docusate sodium 100 MG capsule Commonly known as: Colace Take 1 capsule (100 mg total) by mouth 2 (two) times daily. While taking narcotic pain medicine.   escitalopram 10 MG tablet Commonly known as: LEXAPRO Take 1.5 tablets (15 mg total) by mouth daily.   famotidine 20 MG tablet Commonly known as: Pepcid Take 1 tablet (20 mg total) by mouth daily as needed for heartburn or indigestion.   FIBER PO Take 1 capsule by mouth in the morning and at bedtime.   hydrocortisone cream 1 % Apply 1 application topically daily as needed for itching.   losartan 50 MG tablet Commonly known as: COZAAR Take 1 tablet by mouth daily.   methocarbamol 500 MG tablet Commonly known as: ROBAXIN Take 1 tablet (500 mg total) by mouth every 8 (eight) hours as needed for muscle spasms.   methotrexate 2.5 MG tablet Commonly known as: RHEUMATREX Take 15 mg by mouth once a week.   ondansetron 4 MG tablet Commonly known as: Zofran Take 1 tablet (4 mg total) by mouth daily as needed for nausea or vomiting.   oxyCODONE 5 MG immediate release tablet Commonly known as: Roxicodone Take 1 tablet (5 mg total) by mouth every 4 (four) hours as needed for up to 5 days.   polyethylene glycol 17 g packet Commonly known as: MIRALAX / GLYCOLAX Take 17 g by mouth 2 (two) times daily.   rivaroxaban 10 MG Tabs tablet Commonly known as: Xarelto Take 1 tablet (10 mg total) by mouth daily.   senna 8.6 MG Tabs tablet Commonly known as: SENOKOT Take 2 tablets (17.2 mg total) by mouth 2 (two) times daily.   sulfamethoxazole-trimethoprim 400-80 MG tablet Commonly known as: BACTRIM Take 1 tablet by mouth daily. For urethritis.   SUMAtriptan 100 MG tablet Commonly known as: IMITREX TAKE 1 TABLET BY MOUTH AS NEEDED FOR MIGRAINE. MAY REPEAT DOSE IN 2 HOURS IF HEADACHE  PERSISTS OR RECURS   traZODone 50 MG tablet Commonly known as: DESYREL TAKE 1 TABLET(50 MG) BY MOUTH AT BEDTIME   Vitamin D3 125 MCG (5000 UT) Tabs Take 5,000 Units by mouth every evening.               Discharge Care Instructions  (From admission, onward)           Start     Ordered   08/06/20 0000  Discharge wound care:       Comments: See above.   08/06/20 1019            Follow-up Information     Wylene Simmer, MD. Schedule an appointment as soon as possible for a visit in 2 week(s).   Specialty: Orthopedic Surgery Contact information: [address] [place] [address] [place] [address] [phone]                Allergies  Allergen Reactions   [place] Nail [Apremilast] Anaphylaxis    Suicidal ideation   Penicillins Anaphylaxis    Has patient had  a PCN reaction causing immediate rash, facial/tongue/throat swelling, SOB or lightheadedness with hypotension: Yes Has patient had a PCN reaction causing severe rash involving mucus membranes or skin necrosis: Yes Has patient had a PCN reaction that required hospitalization: No Has patient had a PCN reaction occurring within the last 10 year No If all of the above answers are "NO", then may proceed with Cephalosporin use.    Erythromycin Other (See Comments)    SEVERE STOMACH CRAMPS   Morphine And Related Nausea And Vomiting   Nsaids Other (See Comments)    SEVERE STOMACH CRAMPS, MOUTH SORES **Able to tolerate Tylenol   Remicade [Infliximab] Other (See Comments)    Shut down immune system-BP high   Tolmetin     SEVERE STOMACH CRAMPS   Nickel Rash    Including snaps on hospital gowns     Consultations: Dr Doran Durand   Procedures/Studies: DG Ankle Complete Right  Result Date: 07/31/2020 CLINICAL DATA:  Right ankle pain after a fall without obvious deformity. EXAM: RIGHT ANKLE - COMPLETE 3+ VIEW COMPARISON:  None. FINDINGS: Trimalleolar fracture dislocation of the right ankle. There is complete  posterior dislocation of the talus with respect to the tibia. Coronal fracture of the posterior malleolus with posterior displacement of the fracture fragments. Oblique fracture of the distal fibula with lateral angulation and mild displacement of the fracture fragments. Comminuted fractures across the base of the medial malleolus extending into the tibial metaphysis with lateral displacement and angulation of fracture fragments. Old screw fixation of the first tarsometatarsal joint. IMPRESSION: Trimalleolar fracture dislocation of the right ankle. Electronically Signed   By: Lucienne Capers M.D.   On: 07/31/2020 18:09   CT Head Wo Contrast  Result Date: 07/31/2020 CLINICAL DATA:  75 year old female with head trauma. EXAM: CT HEAD WITHOUT CONTRAST CT CERVICAL SPINE WITHOUT CONTRAST TECHNIQUE: Multidetector CT imaging of the head and cervical spine was performed following the standard protocol without intravenous contrast. Multiplanar CT image reconstructions of the cervical spine were also generated. COMPARISON:  None. FINDINGS: CT HEAD FINDINGS Brain: Mild age-related atrophy and chronic microvascular ischemic changes. There is no acute intracranial hemorrhage. No mass effect or midline shift. No extra-axial fluid collection. Vascular: No hyperdense vessel or unexpected calcification. Skull: Normal. Negative for fracture or focal lesion. Sinuses/Orbits: No acute finding. Other: None CT CERVICAL SPINE FINDINGS Alignment: No acute subluxation. Skull base and vertebrae: No acute fracture. Osteopenia Soft tissues and spinal canal: No prevertebral fluid or swelling. No visible canal hematoma. Disc levels: Degenerative changes with disc desiccation and vacuum phenomena at C4-C5. Upper chest: Negative. Other: None IMPRESSION: 1. No acute intracranial pathology. Mild age-related atrophy and chronic microvascular ischemic changes. 2. No acute/traumatic cervical spine pathology. Electronically Signed   By: Anner Crete M.D.   On: 07/31/2020 18:41   CT Cervical Spine Wo Contrast  Result Date: 07/31/2020 CLINICAL DATA:  75 year old female with head trauma. EXAM: CT HEAD WITHOUT CONTRAST CT CERVICAL SPINE WITHOUT CONTRAST TECHNIQUE: Multidetector CT imaging of the head and cervical spine was performed following the standard protocol without intravenous contrast. Multiplanar CT image reconstructions of the cervical spine were also generated. COMPARISON:  None. FINDINGS: CT HEAD FINDINGS Brain: Mild age-related atrophy and chronic microvascular ischemic changes. There is no acute intracranial hemorrhage. No mass effect or midline shift. No extra-axial fluid collection. Vascular: No hyperdense vessel or unexpected calcification. Skull: Normal. Negative for fracture or focal lesion. Sinuses/Orbits: No acute finding. Other: None CT CERVICAL SPINE FINDINGS Alignment: No acute subluxation. Skull  base and vertebrae: No acute fracture. Osteopenia Soft tissues and spinal canal: No prevertebral fluid or swelling. No visible canal hematoma. Disc levels: Degenerative changes with disc desiccation and vacuum phenomena at C4-C5. Upper chest: Negative. Other: None IMPRESSION: 1. No acute intracranial pathology. Mild age-related atrophy and chronic microvascular ischemic changes. 2. No acute/traumatic cervical spine pathology. Electronically Signed   By: Anner Crete M.D.   On: 07/31/2020 18:41   CT Ankle Right Wo Contrast  Result Date: 07/31/2020 CLINICAL DATA:  Trimalleolar right ankle fracture post reduction. EXAM: CT OF THE RIGHT ANKLE WITHOUT CONTRAST TECHNIQUE: Multidetector CT imaging of the right ankle was performed according to the standard protocol. Multiplanar CT image reconstructions were also generated. COMPARISON:  Radiographs 07/31/2020. FINDINGS: Bones/Joint/Cartilage The ankle is splinted. The previously demonstrated posterior tibiotalar dislocation has been reduced. Mildly comminuted and displaced fracture of  the posterior tibial plafond demonstrates 3 mm of impaction of the articular surface posteriorly. This component of the fracture extends into the distal tibiofibular joint which is mildly widened. There is a mildly displaced and mildly comminuted fracture through the base of the medial malleolus which extends to the articular surface of the tibial plafond anteriorly. There is mild residual anteromedial widening of the tibiotalar joint with interposed small fracture fragments and a small ankle joint effusion. There is a comminuted and mildly displaced fracture of the distal fibular shaft which is located 1.8 cm proximal to the ankle mortise. This fracture demonstrates up to 7 mm of residual lateral displacement. The talar dome and additional visualized tarsal bones appear intact. There are postsurgical changes related to previous 1st tarsometatarsal arthrodesis. Moderately advanced midfoot degenerative changes are noted. Ligaments Suboptimally assessed by CT. Muscles and Tendons The ankle tendons appear grossly intact without entrapment in the fractures. Soft tissues Moderate soft tissue swelling surrounding the fractures in the distal lower leg. No evidence of foreign body, soft tissue emphysema or significant hematoma. IMPRESSION: 1. Residual displacement of trimalleolar fracture as described. The tibiotalar dislocation has been reduced, although there is mild residual widening of the ankle mortise and tibiotalar joint space widening anteromedially with interposed small fracture fragments. 2. No evidence of tarsal bone fracture. 3. Moderate midfoot degenerative changes. 4. No gross tendon rupture or entrapment identified. Electronically Signed   By: Richardean Sale M.D.   On: 07/31/2020 21:47   DG Ankle Right Port  Result Date: 07/31/2020 CLINICAL DATA:  Post reduction EXAM: PORTABLE RIGHT ANKLE - 2 VIEW COMPARISON:  07/31/2020 FINDINGS: Casting material limits bone detail. Redemonstrated trimalleolar fracture.  Comminuted distal fibular fracture with decreased angulation, but with residual 1/2 shaft diameter posterior and 1/4 shaft diameter lateral displacement of largest fracture fragment. Comminuted medial malleolar fracture with overall decreased displacement. Displaced posterior malleolar fracture fragment. Reduction of previously noted ankle dislocation but with residual mild posterior and lateral subluxation of the talar dome with respect to the distal tibia. Residual asymmetric widening of the superomedial mortise and anterior ankle joint. Fixating hardware at the base of the great toe. IMPRESSION: 1. Trimalleolar fracture with overall decreased fracture displacement compared to prior. Reduction of ankle dislocation though with residual mild lateral and posterior subluxation of the talar dome with respect to the distal tibia Electronically Signed   By: Donavan Foil M.D.   On: 07/31/2020 21:04    Subjective: She report ankle pain. Didn't had a good night.  She has had 2 BM.    Discharge Exam: Vitals:   08/05/20 2057 08/06/20 0524  BP: 128/81 (!) 155/106  Pulse: 66 80  Resp: 16 17  Temp: 98.6 F (37 C) 98 F (36.7 C)  SpO2: 94% 97%     General: Pt is alert, awake, not in acute distress Cardiovascular: RRR, S1/S2 +, no rubs, no gallops Respiratory: CTA bilaterally, no wheezing, no rhonchi Abdominal: Soft, NT, ND, bowel sounds + Extremities: no edema, no cyanosis, dressing right ankle.    The results of significant diagnostics from this hospitalization (including imaging, microbiology, ancillary and laboratory) are listed below for reference.     Microbiology: Recent Results (from the past 240 hour(s))  Resp Panel by RT-PCR (Flu A&B, Covid) Nasopharyngeal Swab     Status: None   Collection Time: 07/31/20  7:44 PM   Specimen: Nasopharyngeal Swab; Nasopharyngeal(NP) swabs in vial transport medium  Result Value Ref Range Status   SARS Coronavirus 2 by RT PCR NEGATIVE NEGATIVE Final     Comment: (NOTE) SARS-CoV-2 target nucleic acids are NOT DETECTED.  The SARS-CoV-2 RNA is generally detectable in upper respiratory specimens during the acute phase of infection. The lowest concentration of SARS-CoV-2 viral copies this assay can detect is 138 copies/mL. A negative result does not preclude SARS-Cov-2 infection and should not be used as the sole basis for treatment or other patient management decisions. A negative result may occur with  improper specimen collection/handling, submission of specimen other than nasopharyngeal swab, presence of viral mutation(s) within the areas targeted by this assay, and inadequate number of viral copies(<138 copies/mL). A negative result must be combined with clinical observations, patient history, and epidemiological information. The expected result is Negative.  Fact Sheet for Patients:  EntrepreneurPulse.com.au  Fact Sheet for Healthcare Providers:  IncredibleEmployment.be  This test is no t yet approved or cleared by the [place] FDA and  has been authorized for detection and/or diagnosis of SARS-CoV-2 by FDA under an Emergency Use Authorization (EUA). This EUA will remain  in effect (meaning this test can be used) for the duration of the COVID-19 declaration under Section 564(b)(1) of the Act, 21 U.S.C.section 360bbb-3(b)(1), unless the authorization is terminated  or revoked sooner.       Influenza A by PCR NEGATIVE NEGATIVE Final   Influenza B by PCR NEGATIVE NEGATIVE Final    Comment: (NOTE) The Xpert Xpress SARS-CoV-2/FLU/RSV plus assay is intended as an aid in the diagnosis of influenza from Nasopharyngeal swab specimens and should not be used as a sole basis for treatment. Nasal washings and aspirates are unacceptable for Xpert Xpress SARS-CoV-2/FLU/RSV testing.  Fact Sheet for Patients: EntrepreneurPulse.com.au  Fact Sheet for Healthcare  Providers: IncredibleEmployment.be  This test is not yet approved or cleared by the [place] FDA and has been authorized for detection and/or diagnosis of SARS-CoV-2 by FDA under an Emergency Use Authorization (EUA). This EUA will remain in effect (meaning this test can be used) for the duration of the COVID-19 declaration under Section 564(b)(1) of the Act, 21 U.S.C. section 360bbb-3(b)(1), unless the authorization is terminated or revoked.  Performed at [facility], [place] [address], [place], [place] [address]   Surgical PCR screen     Status: None   Collection Time: 08/03/20  3:30 PM   Specimen: Nasal Mucosa; Nasal Swab  Result Value Ref Range Status   MRSA, PCR NEGATIVE NEGATIVE Final   Staphylococcus aureus NEGATIVE NEGATIVE Final    Comment: (NOTE) The Xpert SA Assay (FDA approved for NASAL specimens in patients 25 years of age and older), is one component of a comprehensive surveillance program.  It is not intended to diagnose infection nor to guide or monitor treatment. Performed at [facility], [place] [address], [place], [place] [address]      Labs: BNP (last 3 results) No results for input(s): BNP in the last 8760 hours. Basic Metabolic Panel: Recent Labs  Lab 07/31/20 1706 08/01/20 0105 08/02/20 0946 08/04/20 1022 08/05/20 0917  NA 138 136 136 136 139  K 3.5 3.4* 3.7 4.0 3.6  CL 107 104 102 101 105  CO2 25 25 28 29 28   GLUCOSE 109* 113* 107* 129* 110*  BUN 13 13 7* 12 13  CREATININE 0.60 0.55  0.48 0.64 0.78 0.59  CALCIUM 9.0 8.9 8.7* 8.7* 8.9   Liver Function Tests: Recent Labs  Lab 07/31/20 1706 08/01/20 0105  AST 23 22  ALT 24 22  ALKPHOS 60 61  BILITOT 0.6 0.8  PROT 6.8 6.7  ALBUMIN 3.8 4.0   No results for input(s): LIPASE, AMYLASE in the last 168 hours. No results for input(s): AMMONIA in the last 168 hours. CBC: Recent Labs  Lab 07/31/20 1706 08/01/20 0105 08/04/20 1022  08/05/20 0917  WBC 8.2 9.6 6.1 9.8  NEUTROABS 6.1  --   --   --   HGB 12.0 11.9* 11.2* 11.6*  HCT 35.6* 35.4* 33.9* 34.7*  MCV 99.2 100.3* 102.1* 101.5*  PLT 242 240 245 267   Cardiac Enzymes: No results for input(s): CKTOTAL, CKMB, CKMBINDEX, TROPONINI in the last 168 hours. BNP: Invalid input(s): POCBNP CBG: No results for input(s): GLUCAP in the last 168 hours. D-Dimer No results for input(s): DDIMER in the last 72 hours. Hgb A1c No results for input(s): HGBA1C in the last 72 hours. Lipid Profile No results for input(s): CHOL, HDL, LDLCALC, TRIG, CHOLHDL, LDLDIRECT in the last 72 hours. Thyroid function studies No results for input(s): TSH, T4TOTAL, T3FREE, THYROIDAB in the last 72 hours.  Invalid input(s): FREET3 Anemia work up No results for input(s): VITAMINB12, FOLATE, FERRITIN, TIBC, IRON, RETICCTPCT in the last 72 hours. Urinalysis    Component Value Date/Time   COLORURINE YELLOW 10/29/2014 0125   APPEARANCEUR CLEAR 10/29/2014 0125   LABSPEC 1.012 10/29/2014 0125   PHURINE 6.5 10/29/2014 0125   GLUCOSEU NEGATIVE 10/29/2014 0125   HGBUR TRACE (A) 10/29/2014 0125   HGBUR trace-lysed 10/29/2008 1057   BILIRUBINUR N 07/20/2017 1134   KETONESUR 40 (A) 10/29/2014 0125   PROTEINUR Positive (A) 07/20/2017 1134   PROTEINUR NEGATIVE 10/29/2014 0125   UROBILINOGEN 0.2 07/20/2017 1134   UROBILINOGEN 1.0 10/29/2014 0125   NITRITE N 07/20/2017 1134   NITRITE NEGATIVE 10/29/2014 0125   LEUKOCYTESUR Negative 07/20/2017 1134   Sepsis Labs Invalid input(s): PROCALCITONIN,  WBC,  LACTICIDVEN Microbiology Recent Results (from the past 240 hour(s))  Resp Panel by RT-PCR (Flu A&B, Covid) Nasopharyngeal Swab     Status: None   Collection Time: 07/31/20  7:44 PM   Specimen: Nasopharyngeal Swab; Nasopharyngeal(NP) swabs in vial transport medium  Result Value Ref Range Status   SARS Coronavirus 2 by RT PCR NEGATIVE NEGATIVE Final    Comment: (NOTE) SARS-CoV-2 target nucleic  acids are NOT DETECTED.  The SARS-CoV-2 RNA is generally detectable in upper respiratory specimens during the acute phase of infection. The lowest concentration of SARS-CoV-2 viral copies this assay can detect is 138 copies/mL. A negative result does not preclude SARS-Cov-2 infection and should not be used as the sole basis for treatment or other patient management decisions. A negative result may occur with  improper  specimen collection/handling, submission of specimen other than nasopharyngeal swab, presence of viral mutation(s) within the areas targeted by this assay, and inadequate number of viral copies(<138 copies/mL). A negative result must be combined with clinical observations, patient history, and epidemiological information. The expected result is Negative.  Fact Sheet for Patients:  EntrepreneurPulse.com.au  Fact Sheet for Healthcare Providers:  IncredibleEmployment.be  This test is no t yet approved or cleared by the [place] FDA and  has been authorized for detection and/or diagnosis of SARS-CoV-2 by FDA under an Emergency Use Authorization (EUA). This EUA will remain  in effect (meaning this test can be used) for the duration of the COVID-19 declaration under Section 564(b)(1) of the Act, 21 U.S.C.section 360bbb-3(b)(1), unless the authorization is terminated  or revoked sooner.       Influenza A by PCR NEGATIVE NEGATIVE Final   Influenza B by PCR NEGATIVE NEGATIVE Final    Comment: (NOTE) The Xpert Xpress SARS-CoV-2/FLU/RSV plus assay is intended as an aid in the diagnosis of influenza from Nasopharyngeal swab specimens and should not be used as a sole basis for treatment. Nasal washings and aspirates are unacceptable for Xpert Xpress SARS-CoV-2/FLU/RSV testing.  Fact Sheet for Patients: EntrepreneurPulse.com.au  Fact Sheet for Healthcare Providers: IncredibleEmployment.be  This  test is not yet approved or cleared by the [place] FDA and has been authorized for detection and/or diagnosis of SARS-CoV-2 by FDA under an Emergency Use Authorization (EUA). This EUA will remain in effect (meaning this test can be used) for the duration of the COVID-19 declaration under Section 564(b)(1) of the Act, 21 U.S.C. section 360bbb-3(b)(1), unless the authorization is terminated or revoked.  Performed at [facility], [place] [address] [place], [place] [address]   Surgical PCR screen     Status: None   Collection Time: 08/03/20  3:30 PM   Specimen: Nasal Mucosa; Nasal Swab  Result Value Ref Range Status   MRSA, PCR NEGATIVE NEGATIVE Final   Staphylococcus aureus NEGATIVE NEGATIVE Final    Comment: (NOTE) The Xpert SA Assay (FDA approved for NASAL specimens in patients 79 years of age and older), is one component of a comprehensive surveillance program. It is not intended to diagnose infection nor to guide or monitor treatment. Performed at [facility], [place] [address], [place], [place] [address]      Time coordinating discharge: 40 minutes  SIGNED:   Elmarie Shiley, MD  [facility]

## 2020-08-06 NOTE — TOC Transition Note (Signed)
Transition of Care [facility]) - CM/SW Discharge Note  Patient Details  Name: RETHER RISON MRN: [id number] Date of Birth: Mar 25, 1945  Transition of Care [facility]) CM/SW Contact:  Sherie Don, LCSW Phone Number: 08/06/2020, 12:18 PM  Clinical Narrative: Rosalie Gums was received. CSW provided patient with bed offers; patient chose [facility] as [facility] does not have bed availability. COVID test was negative. Patient will go to room 503 and the number to call for report is [phone]. Discharge summary, discharge orders, and SNF transfer report faxed to facility in hub. Medical necessity form done; PTAR scheduled. Discharge packet completed. RN updated. TOC signing off.  Final next level of care: Skilled Nursing Facility Barriers to Discharge: Barriers Resolved  Patient Goals and CMS Choice Patient states their goals for this hospitalization and ongoing recovery are:: Go to SNF CMS Medicare.gov Compare Post Acute Care list provided to:: Patient Choice offered to / list presented to : Patient  Discharge Placement PASRR number recieved: 08/06/20       Patient chooses bed at: [facility] Patient to be transferred to facility by: PTAR Patient and family notified of of transfer: 08/06/20  Discharge Plan and Services In-house Referral: Clinical Social Work Post Acute Care Choice: [place]          DME Arranged: N/A DME Agency: NA  Readmission Risk Interventions No flowsheet data found.

## 2020-08-06 NOTE — Plan of Care (Signed)

## 2020-08-06 NOTE — Clinical Social Work Note (Signed)
PASRR received: [id number]

## 2020-08-06 NOTE — Progress Notes (Signed)
Called facility and gave report to [facility], all questions answered. Pt not in distress, discharged to facility with belongings via PTAR.

## 2020-08-06 NOTE — Progress Notes (Signed)
Subjective: 2 Days Post-Op Procedure(s) (LRB): OPEN REDUCTION INTERNAL FIXATION (ORIF) ANKLE FRACTURE (Right)  Patient reports pain as moderate.  Reports that she had a difficult time sleeping due to pain.  Denies fever, chills, N/V, CP, SOB.  Tolerating POs well.  Admits to BM.  Objective:   VITALS:  Temp:  [98 F (36.7 C)-98.6 F (37 C)] 98 F (36.7 C) (06/21 0524) Pulse Rate:  [66-80] 80 (06/21 0524) Resp:  [16-17] 17 (06/21 0524) BP: (128-155)/(68-106) 155/106 (06/21 0524) SpO2:  [94 %-99 %] 97 % (06/21 0524)  General: WDWN patient in NAD. Psych:  Appropriate mood and affect. Neuro:  A&O x 3, Moving all extremities, sensation intact to light touch HEENT:  EOMs intact Chest:  Even non-labored respirations Skin: SLS C/D/I, no rashes or lesions Extremities: warm/dry, no visible edema, erythema or echymosis.  No lymphadenopathy. Pulses: Popliteus 2+ MSK:  ROM: EHL/FHL intact, MMT: able to perform quad set   LABS Recent Labs    08/04/20 1022 08/05/20 0917  HGB 11.2* 11.6*  WBC 6.1 9.8  PLT 245 267   Recent Labs    08/04/20 1022 08/05/20 0917  NA 136 139  K 4.0 3.6  CL 101 105  CO2 29 28  BUN 12 13  CREATININE 0.78 0.59  GLUCOSE 129* 110*   No results for input(s): LABPT, INR in the last 72 hours.   Assessment/Plan: 2 Days Post-Op Procedure(s) (LRB): OPEN REDUCTION INTERNAL FIXATION (ORIF) ANKLE FRACTURE (Right)  NWB R LE Up with therapy DVT ppx:  Lovenox in house; transition to Xarelto upon D/C Disp: SNF D/C scripts on chart. Plan for 2 week outpatient post-op visit.  Mechele Claude PA-C [facility] Office:  [phone]

## 2020-08-07 DIAGNOSIS — Z9181 History of falling: Secondary | ICD-10-CM | POA: Diagnosis not present

## 2020-08-07 DIAGNOSIS — S82851D Displaced trimalleolar fracture of right lower leg, subsequent encounter for closed fracture with routine healing: Secondary | ICD-10-CM | POA: Diagnosis not present

## 2020-08-07 DIAGNOSIS — S0191XD Laceration without foreign body of unspecified part of head, subsequent encounter: Secondary | ICD-10-CM | POA: Diagnosis not present

## 2020-08-07 DIAGNOSIS — R262 Difficulty in walking, not elsewhere classified: Secondary | ICD-10-CM | POA: Diagnosis not present

## 2020-08-08 DIAGNOSIS — R262 Difficulty in walking, not elsewhere classified: Secondary | ICD-10-CM | POA: Diagnosis not present

## 2020-08-08 DIAGNOSIS — E785 Hyperlipidemia, unspecified: Secondary | ICD-10-CM | POA: Diagnosis not present

## 2020-08-08 DIAGNOSIS — S82851D Displaced trimalleolar fracture of right lower leg, subsequent encounter for closed fracture with routine healing: Secondary | ICD-10-CM | POA: Diagnosis not present

## 2020-08-08 DIAGNOSIS — S0191XD Laceration without foreign body of unspecified part of head, subsequent encounter: Secondary | ICD-10-CM | POA: Diagnosis not present

## 2020-08-09 ENCOUNTER — Telehealth: Payer: Self-pay | Admitting: Adult Health

## 2020-08-09 NOTE — Telephone Encounter (Signed)
Tonya Bartlett from [facility] is calling to seeing if Tonya Bartlett will be willing to follow for PT and OT orders for home health.   [place] [phone]

## 2020-08-12 DIAGNOSIS — Z9181 History of falling: Secondary | ICD-10-CM | POA: Diagnosis not present

## 2020-08-12 DIAGNOSIS — Z86010 Personal history of colonic polyps: Secondary | ICD-10-CM | POA: Diagnosis not present

## 2020-08-12 DIAGNOSIS — G43909 Migraine, unspecified, not intractable, without status migrainosus: Secondary | ICD-10-CM | POA: Diagnosis not present

## 2020-08-12 DIAGNOSIS — K219 Gastro-esophageal reflux disease without esophagitis: Secondary | ICD-10-CM | POA: Diagnosis not present

## 2020-08-12 DIAGNOSIS — S82851D Displaced trimalleolar fracture of right lower leg, subsequent encounter for closed fracture with routine healing: Secondary | ICD-10-CM | POA: Diagnosis not present

## 2020-08-12 DIAGNOSIS — E876 Hypokalemia: Secondary | ICD-10-CM | POA: Diagnosis not present

## 2020-08-12 DIAGNOSIS — Z85820 Personal history of malignant melanoma of skin: Secondary | ICD-10-CM | POA: Diagnosis not present

## 2020-08-12 DIAGNOSIS — Z6836 Body mass index (BMI) 36.0-36.9, adult: Secondary | ICD-10-CM | POA: Diagnosis not present

## 2020-08-12 DIAGNOSIS — D509 Iron deficiency anemia, unspecified: Secondary | ICD-10-CM | POA: Diagnosis not present

## 2020-08-12 DIAGNOSIS — M19011 Primary osteoarthritis, right shoulder: Secondary | ICD-10-CM | POA: Diagnosis not present

## 2020-08-12 DIAGNOSIS — G4700 Insomnia, unspecified: Secondary | ICD-10-CM | POA: Diagnosis not present

## 2020-08-12 DIAGNOSIS — R350 Frequency of micturition: Secondary | ICD-10-CM | POA: Diagnosis not present

## 2020-08-12 DIAGNOSIS — K589 Irritable bowel syndrome without diarrhea: Secondary | ICD-10-CM | POA: Diagnosis not present

## 2020-08-12 DIAGNOSIS — K529 Noninfective gastroenteritis and colitis, unspecified: Secondary | ICD-10-CM | POA: Diagnosis not present

## 2020-08-12 DIAGNOSIS — I10 Essential (primary) hypertension: Secondary | ICD-10-CM | POA: Diagnosis not present

## 2020-08-12 DIAGNOSIS — Z86718 Personal history of other venous thrombosis and embolism: Secondary | ICD-10-CM | POA: Diagnosis not present

## 2020-08-12 DIAGNOSIS — G3184 Mild cognitive impairment, so stated: Secondary | ICD-10-CM | POA: Diagnosis not present

## 2020-08-12 DIAGNOSIS — Z7951 Long term (current) use of inhaled steroids: Secondary | ICD-10-CM | POA: Diagnosis not present

## 2020-08-12 DIAGNOSIS — K5790 Diverticulosis of intestine, part unspecified, without perforation or abscess without bleeding: Secondary | ICD-10-CM | POA: Diagnosis not present

## 2020-08-12 DIAGNOSIS — J309 Allergic rhinitis, unspecified: Secondary | ICD-10-CM | POA: Diagnosis not present

## 2020-08-12 DIAGNOSIS — E785 Hyperlipidemia, unspecified: Secondary | ICD-10-CM | POA: Diagnosis not present

## 2020-08-12 DIAGNOSIS — F988 Other specified behavioral and emotional disorders with onset usually occurring in childhood and adolescence: Secondary | ICD-10-CM | POA: Diagnosis not present

## 2020-08-12 DIAGNOSIS — F32A Depression, unspecified: Secondary | ICD-10-CM | POA: Diagnosis not present

## 2020-08-12 DIAGNOSIS — Z87891 Personal history of nicotine dependence: Secondary | ICD-10-CM | POA: Diagnosis not present

## 2020-08-12 NOTE — Telephone Encounter (Signed)
Tonya Bartlett is out on vacation. Charlene, the covering nurse stated that they will wait until the provider gives orders but noted that pt cannot start w/o orders. I advised that Tommi Rumps is out and we will get to it as soon as we can.

## 2020-08-14 NOTE — Telephone Encounter (Signed)
Verbal orders given to [facility]

## 2020-08-16 DIAGNOSIS — D509 Iron deficiency anemia, unspecified: Secondary | ICD-10-CM | POA: Diagnosis not present

## 2020-08-16 DIAGNOSIS — E785 Hyperlipidemia, unspecified: Secondary | ICD-10-CM | POA: Diagnosis not present

## 2020-08-16 DIAGNOSIS — S82851D Displaced trimalleolar fracture of right lower leg, subsequent encounter for closed fracture with routine healing: Secondary | ICD-10-CM | POA: Diagnosis not present

## 2020-08-16 DIAGNOSIS — M19011 Primary osteoarthritis, right shoulder: Secondary | ICD-10-CM | POA: Diagnosis not present

## 2020-08-16 DIAGNOSIS — I10 Essential (primary) hypertension: Secondary | ICD-10-CM | POA: Diagnosis not present

## 2020-08-20 DIAGNOSIS — M19011 Primary osteoarthritis, right shoulder: Secondary | ICD-10-CM | POA: Diagnosis not present

## 2020-08-20 DIAGNOSIS — S82851D Displaced trimalleolar fracture of right lower leg, subsequent encounter for closed fracture with routine healing: Secondary | ICD-10-CM | POA: Diagnosis not present

## 2020-08-20 DIAGNOSIS — I10 Essential (primary) hypertension: Secondary | ICD-10-CM | POA: Diagnosis not present

## 2020-08-20 DIAGNOSIS — S82851A Displaced trimalleolar fracture of right lower leg, initial encounter for closed fracture: Secondary | ICD-10-CM | POA: Diagnosis not present

## 2020-08-20 DIAGNOSIS — D509 Iron deficiency anemia, unspecified: Secondary | ICD-10-CM | POA: Diagnosis not present

## 2020-08-20 DIAGNOSIS — Z4889 Encounter for other specified surgical aftercare: Secondary | ICD-10-CM | POA: Diagnosis not present

## 2020-08-20 DIAGNOSIS — E785 Hyperlipidemia, unspecified: Secondary | ICD-10-CM | POA: Diagnosis not present

## 2020-08-21 ENCOUNTER — Telehealth: Payer: Self-pay

## 2020-08-21 DIAGNOSIS — I10 Essential (primary) hypertension: Secondary | ICD-10-CM | POA: Diagnosis not present

## 2020-08-21 DIAGNOSIS — E785 Hyperlipidemia, unspecified: Secondary | ICD-10-CM | POA: Diagnosis not present

## 2020-08-21 DIAGNOSIS — D509 Iron deficiency anemia, unspecified: Secondary | ICD-10-CM | POA: Diagnosis not present

## 2020-08-21 DIAGNOSIS — S82851D Displaced trimalleolar fracture of right lower leg, subsequent encounter for closed fracture with routine healing: Secondary | ICD-10-CM | POA: Diagnosis not present

## 2020-08-21 DIAGNOSIS — M19011 Primary osteoarthritis, right shoulder: Secondary | ICD-10-CM | POA: Diagnosis not present

## 2020-08-21 NOTE — Telephone Encounter (Signed)
Okay thanks for letting me know, she can reschedule at her convenience 

## 2020-08-21 NOTE — Telephone Encounter (Signed)
Called and spoke with pt regarding upcoming EDG with Dr. Havery Moros to find out if she is still taking Xarelto.  Pt stated that she is no longer taking Xarelto.  Pt fractured her ankle and had surgery and was prescribed Xarelto for short-term use.    Pt also requested to cancel her upcoming EDG with Dr. Havery Moros that was scheduled on 08/23/20 since she had a recent surgery on her ankle and said that she cannot bear weight on it for at least another 4 weeks.  Advised pt to call back when she is able to walk and is ready to reschedule procedure.

## 2020-08-22 DIAGNOSIS — E785 Hyperlipidemia, unspecified: Secondary | ICD-10-CM | POA: Diagnosis not present

## 2020-08-22 DIAGNOSIS — D509 Iron deficiency anemia, unspecified: Secondary | ICD-10-CM | POA: Diagnosis not present

## 2020-08-22 DIAGNOSIS — S82851D Displaced trimalleolar fracture of right lower leg, subsequent encounter for closed fracture with routine healing: Secondary | ICD-10-CM | POA: Diagnosis not present

## 2020-08-22 DIAGNOSIS — M19011 Primary osteoarthritis, right shoulder: Secondary | ICD-10-CM | POA: Diagnosis not present

## 2020-08-22 DIAGNOSIS — I10 Essential (primary) hypertension: Secondary | ICD-10-CM | POA: Diagnosis not present

## 2020-08-23 ENCOUNTER — Encounter: Payer: Medicare Other | Admitting: Gastroenterology

## 2020-08-23 ENCOUNTER — Telehealth (INDEPENDENT_AMBULATORY_CARE_PROVIDER_SITE_OTHER): Payer: Medicare Other | Admitting: Adult Health

## 2020-08-23 ENCOUNTER — Encounter: Payer: Self-pay | Admitting: Adult Health

## 2020-08-23 ENCOUNTER — Ambulatory Visit: Payer: Medicare Other | Admitting: Adult Health

## 2020-08-23 VITALS — HR 62 | Ht 65.0 in | Wt 220.0 lb

## 2020-08-23 DIAGNOSIS — S82851A Displaced trimalleolar fracture of right lower leg, initial encounter for closed fracture: Secondary | ICD-10-CM | POA: Diagnosis not present

## 2020-08-23 NOTE — Progress Notes (Signed)
Virtual Visit via Video Note  I connected with Tonya Bartlett on 08/23/20 at 11:00 AM EDT by a video enabled telemedicine application and verified that I am speaking with the correct person using two identifiers.  Location patient: home Location provider:work or home office Persons participating in the virtual visit: patient, provider  I discussed the limitations of evaluation and management by telemedicine and the availability of in person appointments. The patient expressed understanding and agreed to proceed.   HPI: 75 year old female who is being seen today via virtual visit after recent hospital admission.  She was evaluated in the ED on 07/31/2020 after mechanical fall causing right ankle pain.  Her x-ray showed 3 malleoli fracture with dislocation.  CT of the head without contrast was negative.  CT cervical spine negative.  She was admitted for surgical management  She underwent close reduction and splinting in the ED.  Postsurgical: Residual displacement of trimalleolar fracture as described.  Tibial talar dislocation had been reduced, although there is mild residual widening of the ankle mortise and tibiotaloar joint space widening anteromedially with interposed small fracture fragments.  No evidence of tarsal bone fracture.  Moderate midfoot degenerative changes.  She underwent open treatment of right ankle trimalleolar fracture with internal fixation of the posterior lip on 6/19  Was discharged to skilled nursing facility for rehab.  She reports that she went to [facility] and after 2 days but care was so bad that she called her family to take her home.  She is currently having OT and PT come into the home and reports that she is doing well.  She is in very little pain and will take Tylenol as needed.  She does have a scooter that helps her get around has for the most part she is nonweightbearing with toe-touch only.  Has an upcoming appointment with orthopedics on August 1 for  reevaluation and hopefully to get her cast off.   ROS: See pertinent positives and negatives per HPI.  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   History of hiatal hernia    History of iron deficiency anemia 1996   iron infusion   History of migraine headaches    History of rib fracture    Hyperlipidemia    IBS (irritable bowel syndrome)    Insomnia    MCI (mild cognitive impairment)    Melanoma ([place]) 2019   left leg    Migraine headache    hx of migraines    OA (osteoarthritis)    RIGHT SHOULDER   Pneumonia    remote history   PONV (postoperative nausea and vomiting)    Recovering alcoholic ([place])    SINCE [phone]   Right rotator cuff tear    Unspecified essential hypertension     Past Surgical History:  Procedure Laterality Date   BUNIONECTOMY/  HAMMERTOE CORRECTION  RIGHT FOOT  2011   CATARACT EXTRACTION W/ INTRAOCULAR LENS  IMPLANT, BILATERAL     COLONOSCOPY     KNEE ARTHROSCOPY W/ MENISCECTOMY Bilateral X2  LEFT /    X1  RIGHT   KNEE OPEN  LATERAL RELEASE Bilateral    MOHS SURGERY Left 12/15/2017   Melanoma in situ - left calf - [facility]   ORIF ANKLE FRACTURE Right 08/04/2020   Procedure: OPEN REDUCTION INTERNAL FIXATION (ORIF) ANKLE FRACTURE;  Surgeon: Wylene Simmer, MD;  Location: [facility];  Service: Orthopedics;  Laterality: Right;  Mini C-arm, [employer] Small Frag   REPLACEMENT TOTAL KNEE Left 2006   SHOULDER ARTHROSCOPY WITH SUBACROMIAL DECOMPRESSION, ROTATOR CUFF REPAIR AND BICEP TENDON REPAIR Right 05/23/2013   Procedure: RIGHT SHOULDER ARTHROSCOPY EXAM UNDER ANESTHESIA  WITH SUBACROMIAL DECOMPRESSION,DISTAL CLAVICLE RESECTION, SADLABRAL DEBRIDEMENT  CHONDROPLASTY, BICEP TENOTOMY ;  Surgeon: Sydnee Cabal, MD;  Location: [facility];  Service: Orthopedics;  Laterality: Right;   TONSILLECTOMY AND ADENOIDECTOMY  AGE 52   TOTAL HIP ARTHROPLASTY Left 05/04/2017   Procedure: LEFT TOTAL HIP ARTHROPLASTY ANTERIOR APPROACH;  Surgeon: Paralee Cancel, MD;  Location: [facility];  Service: Orthopedics;  Laterality: Left;   TOTAL HIP ARTHROPLASTY Right 08/01/2019   Procedure: TOTAL HIP ARTHROPLASTY ANTERIOR APPROACH;  Surgeon: Paralee Cancel, MD;  Location: [facility];  Service: Orthopedics;  Laterality: Right;  64mins   TOTAL HIP ARTHROPLASTY Right 08/01/2019   TOTAL KNEE ARTHROPLASTY Bilateral LEFT  1996/   RIGHT 2004   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 1 Tab in the morning 1 tablet at bedtime, Disp: , Rfl:    betamethasone dipropionate 0.05 % lotion, APPLY EXTERNALLY TO THE AFFECTED AREA DAILY, Disp: 60 mL, Rfl: 2   Calcium-Magnesium-Zinc (CAL-MAG-ZINC PO), Take 1 tablet by mouth every evening., Disp: , Rfl:    Cholecalciferol (VITAMIN D3) 125 MCG (5000 UT) TABS, Take 6,000 Units by mouth every evening., Disp: , Rfl:    docusate sodium (COLACE) 100 MG capsule, Take 1 capsule (100 mg total) by mouth 2 (two) times daily. While taking narcotic pain medicine., Disp: 30 capsule, Rfl: 0   escitalopram (LEXAPRO) 10 MG tablet, Take 1.5 tablets (15 mg total) by mouth daily., Disp: 135 tablet, Rfl: 0   famotidine (PEPCID) 20 MG tablet, Take 1 tablet (20 mg total) by mouth daily as needed for heartburn or  indigestion., Disp: 90 tablet, Rfl: 3   FIBER PO, Take 1 capsule by mouth in the morning and at bedtime., Disp: , Rfl:    hydrocortisone cream 1 %, Apply 1 application topically daily as needed for itching., Disp: , Rfl:    L-Methylfolate-B12-B6-B2 (CEREFOLIN PO), Take 1 tablet by mouth daily., Disp: , Rfl:    losartan (COZAAR) 50 MG tablet, Take 1 tablet by mouth daily., Disp: , Rfl:    methocarbamol (ROBAXIN) 500 MG tablet, Take 1 tablet (500 mg total) by mouth every 8 (eight) hours as needed for muscle spasms., Disp: 10 tablet, Rfl: 0   methotrexate (RHEUMATREX) 2.5 MG tablet, Take 15 mg by mouth once a week.,  Disp: , Rfl:    ondansetron (ZOFRAN) 4 MG tablet, Take 1 tablet (4 mg total) by mouth daily as needed for nausea or vomiting., Disp: 30 tablet, Rfl: 0   polyethylene glycol (MIRALAX / GLYCOLAX) 17 g packet, Take 17 g by mouth 2 (two) times daily., Disp: 14 each, Rfl: 0   rivaroxaban (XARELTO) 10 MG TABS tablet, Take 1 tablet (10 mg total) by mouth daily., Disp: 14 tablet, Rfl: 0   senna (SENOKOT) 8.6 MG TABS tablet, Take 2 tablets (17.2 mg total) by mouth 2 (two) times daily., Disp: 30 tablet, Rfl: 0   sulfamethoxazole-trimethoprim (BACTRIM,SEPTRA) 400-80 MG tablet, Take 1 tablet by mouth daily. For urethritis., Disp: , Rfl:    SUMAtriptan (IMITREX) 100 MG tablet, TAKE 1 TABLET BY MOUTH AS NEEDED FOR MIGRAINE. MAY REPEAT DOSE IN 2 HOURS IF HEADACHE PERSISTS OR RECURS, Disp: 10 tablet, Rfl: 0   traZODone (DESYREL) 50 MG tablet, TAKE 1 TABLET(50 MG) BY MOUTH AT BEDTIME, Disp: 90 tablet, Rfl: 1  EXAM:  VITALS per patient if applicable:  GENERAL: alert, oriented, appears well and in no acute distress  HEENT: atraumatic, conjunttiva clear, no obvious abnormalities on inspection of external nose and ears  NECK: normal movements of the head and neck  LUNGS: on inspection no signs of respiratory distress, breathing rate appears normal, no obvious gross SOB, gasping or wheezing  CV: no  obvious cyanosis  MS: moves all visible extremities without noticeable abnormality  PSYCH/NEURO: pleasant and cooperative, no obvious depression or anxiety, speech and thought processing grossly intact  ASSESSMENT AND PLAN:  Discussed the following assessment and plan:  1. Closed trimalleolar fracture of right ankle, initial encounter -Reviewed her hospital notes, imaging, and discharge instructions.  All questions answered to the best of my ability.  Thankfully she is doing well.  Encouraged to continue with PT/OT and follow-up with orthopedics as directed.  She can follow-up with me as needed.      I discussed the assessment and treatment plan with the patient. The patient was provided an opportunity to ask questions and all were answered. The patient agreed with the plan and demonstrated an understanding of the instructions.   The patient was advised to call back or seek an in-person evaluation if the symptoms worsen or if the condition fails to improve as anticipated.   [name], NP

## 2020-08-26 DIAGNOSIS — S82851D Displaced trimalleolar fracture of right lower leg, subsequent encounter for closed fracture with routine healing: Secondary | ICD-10-CM | POA: Diagnosis not present

## 2020-08-26 DIAGNOSIS — M19011 Primary osteoarthritis, right shoulder: Secondary | ICD-10-CM | POA: Diagnosis not present

## 2020-08-26 DIAGNOSIS — E785 Hyperlipidemia, unspecified: Secondary | ICD-10-CM | POA: Diagnosis not present

## 2020-08-26 DIAGNOSIS — D509 Iron deficiency anemia, unspecified: Secondary | ICD-10-CM | POA: Diagnosis not present

## 2020-08-26 DIAGNOSIS — I10 Essential (primary) hypertension: Secondary | ICD-10-CM | POA: Diagnosis not present

## 2020-08-30 DIAGNOSIS — S82851D Displaced trimalleolar fracture of right lower leg, subsequent encounter for closed fracture with routine healing: Secondary | ICD-10-CM | POA: Diagnosis not present

## 2020-08-30 DIAGNOSIS — E785 Hyperlipidemia, unspecified: Secondary | ICD-10-CM | POA: Diagnosis not present

## 2020-08-30 DIAGNOSIS — M19011 Primary osteoarthritis, right shoulder: Secondary | ICD-10-CM | POA: Diagnosis not present

## 2020-08-30 DIAGNOSIS — I10 Essential (primary) hypertension: Secondary | ICD-10-CM | POA: Diagnosis not present

## 2020-08-30 DIAGNOSIS — D509 Iron deficiency anemia, unspecified: Secondary | ICD-10-CM | POA: Diagnosis not present

## 2020-09-06 DIAGNOSIS — D509 Iron deficiency anemia, unspecified: Secondary | ICD-10-CM | POA: Diagnosis not present

## 2020-09-06 DIAGNOSIS — M19011 Primary osteoarthritis, right shoulder: Secondary | ICD-10-CM | POA: Diagnosis not present

## 2020-09-06 DIAGNOSIS — E785 Hyperlipidemia, unspecified: Secondary | ICD-10-CM | POA: Diagnosis not present

## 2020-09-06 DIAGNOSIS — S82851D Displaced trimalleolar fracture of right lower leg, subsequent encounter for closed fracture with routine healing: Secondary | ICD-10-CM | POA: Diagnosis not present

## 2020-09-06 DIAGNOSIS — I10 Essential (primary) hypertension: Secondary | ICD-10-CM | POA: Diagnosis not present

## 2020-09-10 DIAGNOSIS — M19011 Primary osteoarthritis, right shoulder: Secondary | ICD-10-CM | POA: Diagnosis not present

## 2020-09-10 DIAGNOSIS — E785 Hyperlipidemia, unspecified: Secondary | ICD-10-CM | POA: Diagnosis not present

## 2020-09-10 DIAGNOSIS — I10 Essential (primary) hypertension: Secondary | ICD-10-CM | POA: Diagnosis not present

## 2020-09-10 DIAGNOSIS — Z20822 Contact with and (suspected) exposure to covid-19: Secondary | ICD-10-CM | POA: Diagnosis not present

## 2020-09-10 DIAGNOSIS — S82851D Displaced trimalleolar fracture of right lower leg, subsequent encounter for closed fracture with routine healing: Secondary | ICD-10-CM | POA: Diagnosis not present

## 2020-09-10 DIAGNOSIS — D509 Iron deficiency anemia, unspecified: Secondary | ICD-10-CM | POA: Diagnosis not present

## 2020-09-11 DIAGNOSIS — D509 Iron deficiency anemia, unspecified: Secondary | ICD-10-CM | POA: Diagnosis not present

## 2020-09-11 DIAGNOSIS — G3184 Mild cognitive impairment, so stated: Secondary | ICD-10-CM | POA: Diagnosis not present

## 2020-09-11 DIAGNOSIS — J309 Allergic rhinitis, unspecified: Secondary | ICD-10-CM | POA: Diagnosis not present

## 2020-09-11 DIAGNOSIS — Z9181 History of falling: Secondary | ICD-10-CM | POA: Diagnosis not present

## 2020-09-11 DIAGNOSIS — Z6836 Body mass index (BMI) 36.0-36.9, adult: Secondary | ICD-10-CM | POA: Diagnosis not present

## 2020-09-11 DIAGNOSIS — F32A Depression, unspecified: Secondary | ICD-10-CM | POA: Diagnosis not present

## 2020-09-11 DIAGNOSIS — G43909 Migraine, unspecified, not intractable, without status migrainosus: Secondary | ICD-10-CM | POA: Diagnosis not present

## 2020-09-11 DIAGNOSIS — I10 Essential (primary) hypertension: Secondary | ICD-10-CM | POA: Diagnosis not present

## 2020-09-11 DIAGNOSIS — K219 Gastro-esophageal reflux disease without esophagitis: Secondary | ICD-10-CM | POA: Diagnosis not present

## 2020-09-11 DIAGNOSIS — Z86718 Personal history of other venous thrombosis and embolism: Secondary | ICD-10-CM | POA: Diagnosis not present

## 2020-09-11 DIAGNOSIS — K529 Noninfective gastroenteritis and colitis, unspecified: Secondary | ICD-10-CM | POA: Diagnosis not present

## 2020-09-11 DIAGNOSIS — S82851D Displaced trimalleolar fracture of right lower leg, subsequent encounter for closed fracture with routine healing: Secondary | ICD-10-CM | POA: Diagnosis not present

## 2020-09-11 DIAGNOSIS — R350 Frequency of micturition: Secondary | ICD-10-CM | POA: Diagnosis not present

## 2020-09-11 DIAGNOSIS — M19011 Primary osteoarthritis, right shoulder: Secondary | ICD-10-CM | POA: Diagnosis not present

## 2020-09-11 DIAGNOSIS — Z7951 Long term (current) use of inhaled steroids: Secondary | ICD-10-CM | POA: Diagnosis not present

## 2020-09-11 DIAGNOSIS — Z87891 Personal history of nicotine dependence: Secondary | ICD-10-CM | POA: Diagnosis not present

## 2020-09-11 DIAGNOSIS — K5790 Diverticulosis of intestine, part unspecified, without perforation or abscess without bleeding: Secondary | ICD-10-CM | POA: Diagnosis not present

## 2020-09-11 DIAGNOSIS — E876 Hypokalemia: Secondary | ICD-10-CM | POA: Diagnosis not present

## 2020-09-11 DIAGNOSIS — E785 Hyperlipidemia, unspecified: Secondary | ICD-10-CM | POA: Diagnosis not present

## 2020-09-11 DIAGNOSIS — K589 Irritable bowel syndrome without diarrhea: Secondary | ICD-10-CM | POA: Diagnosis not present

## 2020-09-11 DIAGNOSIS — F988 Other specified behavioral and emotional disorders with onset usually occurring in childhood and adolescence: Secondary | ICD-10-CM | POA: Diagnosis not present

## 2020-09-11 DIAGNOSIS — G4700 Insomnia, unspecified: Secondary | ICD-10-CM | POA: Diagnosis not present

## 2020-09-11 DIAGNOSIS — Z86010 Personal history of colonic polyps: Secondary | ICD-10-CM | POA: Diagnosis not present

## 2020-09-11 DIAGNOSIS — Z85820 Personal history of malignant melanoma of skin: Secondary | ICD-10-CM | POA: Diagnosis not present

## 2020-09-12 ENCOUNTER — Telehealth: Payer: Self-pay | Admitting: Pharmacist

## 2020-09-12 NOTE — Chronic Care Management (AMB) (Signed)
Chronic Care Management Pharmacy Assistant   Name: Tonya Bartlett  MRN: [id number] DOB: August 05, 1945  Reason for Encounter: Disease State General Assessment Call   Conditions to be addressed/monitored: HTN   Recent office visits:  08-23-20 [name], NP (PCP) - Patient presented for Closed trimalleolar fracture of right ankle. No medication changes.  07-23-2020 [name], NP (PCP) - Patient presented for frequent falls and hypertension. No medication changes.  04-23-2020 [name], NP (PCP) - Patient presented for Mixed hyperlipidemia and other concerns. Stopped Ferrous sulfate,  MIRALAX and XARELTO   Recent consult visits:  07-24-20 [name] MD [facility]) - Patient presented for major depressive disorder, no other details of visit available.  07-04-2020 Armbruster, [name], MD [facility].) - Patient presented for Dysphagia, unspecified type and other concerns. Famotidine changed to PRN, Losartan decreased to '50mg'$  once daily, Gabapentin discontinued.   Hospital visits:  Medication Reconciliation was completed by comparing discharge summary, patient's EMR and Pharmacy list, and upon discussion with patient.  Patient presented to [facility] on 07/[facility]/2020 due to Trimalleolar fracture. Discharge date was 08/06/2020. Discharged from [place]?Medications Started at [facility] Discharge:?? -started  Docusate sodium '100mg'$  take one capsule two times a day Methocarbamol '500mg'$  take one tablet every 8 hours PRN Ondansetron '4mg'$  PRN Polyethylene glycol 17g take two times a day Rivaroxaban '10mg'$  take one tablet daily Senna 8.'6mg'$  take two tablets two times a day  Due to fracture  Medication Changes at Hospital Discharge: -Changed None  Medications Discontinued at Hospital Discharge: -Stopped  atorvastatin 20 MG tablet  clindamycin 300 MG capsule   Due to fracture  Medications that remain the same after Hospital  Discharge:??  -All other medications will remain the same.    Medications: Outpatient Encounter Medications as of 09/12/2020  Medication Sig   Aspirin 81 MG CAPS Take by mouth. 1 Tab in the morning 1 tablet at bedtime   betamethasone dipropionate 0.05 % lotion APPLY EXTERNALLY TO THE AFFECTED AREA DAILY   Calcium-Magnesium-Zinc (CAL-MAG-ZINC PO) Take 1 tablet by mouth every evening.   Cholecalciferol (VITAMIN D3) 125 MCG (5000 UT) TABS Take 6,000 Units by mouth every evening.   docusate sodium (COLACE) 100 MG capsule Take 1 capsule (100 mg total) by mouth 2 (two) times daily. While taking narcotic pain medicine.   escitalopram (LEXAPRO) 10 MG tablet Take 1.5 tablets (15 mg total) by mouth daily.   famotidine (PEPCID) 20 MG tablet Take 1 tablet (20 mg total) by mouth daily as needed for heartburn or indigestion.   FIBER PO Take 1 capsule by mouth in the morning and at bedtime.   hydrocortisone cream 1 % Apply 1 application topically daily as needed for itching.   L-Methylfolate-B12-B6-B2 (CEREFOLIN PO) Take 1 tablet by mouth daily.   losartan (COZAAR) 50 MG tablet Take 1 tablet by mouth daily.   methocarbamol (ROBAXIN) 500 MG tablet Take 1 tablet (500 mg total) by mouth every 8 (eight) hours as needed for muscle spasms.   methotrexate (RHEUMATREX) 2.5 MG tablet Take 15 mg by mouth once a week.   ondansetron (ZOFRAN) 4 MG tablet Take 1 tablet (4 mg total) by mouth daily as needed for nausea or vomiting.   polyethylene glycol (MIRALAX / GLYCOLAX) 17 g packet Take 17 g by mouth 2 (two) times daily.   rivaroxaban (XARELTO) 10 MG TABS tablet Take 1 tablet (10 mg total) by mouth daily.   senna (SENOKOT) 8.6 MG TABS  tablet Take 2 tablets (17.2 mg total) by mouth 2 (two) times daily.   sulfamethoxazole-trimethoprim (BACTRIM,SEPTRA) 400-80 MG tablet Take 1 tablet by mouth daily. For urethritis.   SUMAtriptan (IMITREX) 100 MG tablet TAKE 1 TABLET BY MOUTH AS NEEDED FOR MIGRAINE. MAY REPEAT DOSE IN 2  HOURS IF HEADACHE PERSISTS OR RECURS   traZODone (DESYREL) 50 MG tablet TAKE 1 TABLET(50 MG) BY MOUTH AT BEDTIME   No facility-administered encounter medications on file as of 09/12/2020.   Reviewed chart prior to disease state call. Spoke with patient regarding BP  Recent Office Vitals: BP Readings from Last 3 Encounters:  08/06/20 (!) 158/80  07/23/20 (!) 160/80  07/04/20 124/72   Pulse Readings from Last 3 Encounters:  08/23/20 62  08/06/20 83  07/23/20 81    Wt Readings from Last 3 Encounters:  08/23/20 220 lb (99.8 kg)  08/01/20 220 lb 7.4 oz (100 kg)  07/23/20 218 lb (98.9 kg)     Kidney Function Lab Results  Component Value Date/Time   CREATININE 0.59 08/05/2020 09:17 AM   CREATININE 0.78 08/04/2020 10:22 AM   CREATININE 0.68 08/30/2015 03:31 PM   GFR 87.17 07/23/2020 04:56 PM   GFRNONAA >60 08/05/2020 09:17 AM   GFRAA >60 08/02/2019 03:06 AM    BMP Latest Ref Rng & Units 08/05/2020 08/04/2020 08/02/2020  Glucose 70 - 99 mg/dL 110(H) 129(H) 107(H)  BUN 8 - 23 mg/dL 13 12 7(L)  Creatinine 0.44 - 1.00 mg/dL 0.59 0.78 0.64  Sodium 135 - 145 mmol/L 139 136 136  Potassium 3.5 - 5.1 mmol/L 3.6 4.0 3.7  Chloride 98 - 111 mmol/L 105 101 102  CO2 22 - 32 mmol/L '28 29 28  '$ Calcium 8.9 - 10.3 mg/dL 8.9 8.7(L) 8.7(L)    Current antihypertensive regimen:  Losartan 100 mg take one tablet daily in morn  How often are you checking your Blood Pressure? infrequently Current home BP readings: Patient reports her OT took it for her today was 139/71 What recent interventions/DTPs have been made by any provider to improve Blood Pressure control since last CPP Visit: Patient reports none Any recent hospitalizations or ED visits since last visit with CPP? Yes What diet changes have been made to improve Blood Pressure Control?  Patient reports she has been having meals prepared by friends and family for the past 6 weeks advised they are low sodium she does however use salt on her  popcorn and her eggs. What exercise is being done to improve your Blood Pressure Control?  Patient reports she has been getting in her exercise with therapy was discharged from OT on today but will still be continuing PT and doing at home exercises. Reports she is anxious to get back to driving and out of her boot.  Adherence Review: Is the patient currently on ACE/ARB medication? Yes Does the patient have >5 day gap between last estimated fill dates? No   Care Gaps:  CCM F/U Call - Scheduled for 12/17/20 '@11'$  am AWV - Scheduled for 04-16-2021 1p COVID Booster #4 [employer]) - Overdue  Star Rating Drugs:  Losartan '100mg'$  - Last filled 08-13-2020 90DS at [place] Pharmacist Assistant [phone]

## 2020-09-13 DIAGNOSIS — S82851D Displaced trimalleolar fracture of right lower leg, subsequent encounter for closed fracture with routine healing: Secondary | ICD-10-CM | POA: Diagnosis not present

## 2020-09-13 DIAGNOSIS — M19011 Primary osteoarthritis, right shoulder: Secondary | ICD-10-CM | POA: Diagnosis not present

## 2020-09-13 DIAGNOSIS — I10 Essential (primary) hypertension: Secondary | ICD-10-CM | POA: Diagnosis not present

## 2020-09-13 DIAGNOSIS — E785 Hyperlipidemia, unspecified: Secondary | ICD-10-CM | POA: Diagnosis not present

## 2020-09-13 DIAGNOSIS — D509 Iron deficiency anemia, unspecified: Secondary | ICD-10-CM | POA: Diagnosis not present

## 2020-09-16 DIAGNOSIS — Z20822 Contact with and (suspected) exposure to covid-19: Secondary | ICD-10-CM | POA: Diagnosis not present

## 2020-09-16 DIAGNOSIS — S82851D Displaced trimalleolar fracture of right lower leg, subsequent encounter for closed fracture with routine healing: Secondary | ICD-10-CM | POA: Diagnosis not present

## 2020-09-16 DIAGNOSIS — Z4789 Encounter for other orthopedic aftercare: Secondary | ICD-10-CM | POA: Diagnosis not present

## 2020-09-16 DIAGNOSIS — M25571 Pain in right ankle and joints of right foot: Secondary | ICD-10-CM | POA: Diagnosis not present

## 2020-09-17 DIAGNOSIS — S82851D Displaced trimalleolar fracture of right lower leg, subsequent encounter for closed fracture with routine healing: Secondary | ICD-10-CM | POA: Diagnosis not present

## 2020-09-17 DIAGNOSIS — M19011 Primary osteoarthritis, right shoulder: Secondary | ICD-10-CM | POA: Diagnosis not present

## 2020-09-17 DIAGNOSIS — D509 Iron deficiency anemia, unspecified: Secondary | ICD-10-CM | POA: Diagnosis not present

## 2020-09-17 DIAGNOSIS — E785 Hyperlipidemia, unspecified: Secondary | ICD-10-CM | POA: Diagnosis not present

## 2020-09-17 DIAGNOSIS — I10 Essential (primary) hypertension: Secondary | ICD-10-CM | POA: Diagnosis not present

## 2020-09-18 DIAGNOSIS — S82851D Displaced trimalleolar fracture of right lower leg, subsequent encounter for closed fracture with routine healing: Secondary | ICD-10-CM | POA: Diagnosis not present

## 2020-09-18 DIAGNOSIS — D509 Iron deficiency anemia, unspecified: Secondary | ICD-10-CM | POA: Diagnosis not present

## 2020-09-18 DIAGNOSIS — M19011 Primary osteoarthritis, right shoulder: Secondary | ICD-10-CM | POA: Diagnosis not present

## 2020-09-18 DIAGNOSIS — I10 Essential (primary) hypertension: Secondary | ICD-10-CM | POA: Diagnosis not present

## 2020-09-18 DIAGNOSIS — E785 Hyperlipidemia, unspecified: Secondary | ICD-10-CM | POA: Diagnosis not present

## 2020-09-23 DIAGNOSIS — I10 Essential (primary) hypertension: Secondary | ICD-10-CM | POA: Diagnosis not present

## 2020-09-23 DIAGNOSIS — S82851D Displaced trimalleolar fracture of right lower leg, subsequent encounter for closed fracture with routine healing: Secondary | ICD-10-CM | POA: Diagnosis not present

## 2020-09-23 DIAGNOSIS — D509 Iron deficiency anemia, unspecified: Secondary | ICD-10-CM | POA: Diagnosis not present

## 2020-09-23 DIAGNOSIS — E785 Hyperlipidemia, unspecified: Secondary | ICD-10-CM | POA: Diagnosis not present

## 2020-09-23 DIAGNOSIS — M19011 Primary osteoarthritis, right shoulder: Secondary | ICD-10-CM | POA: Diagnosis not present

## 2020-09-24 DIAGNOSIS — H9042 Sensorineural hearing loss, unilateral, left ear, with unrestricted hearing on the contralateral side: Secondary | ICD-10-CM | POA: Diagnosis not present

## 2020-09-24 DIAGNOSIS — H838X2 Other specified diseases of left inner ear: Secondary | ICD-10-CM | POA: Diagnosis not present

## 2020-09-24 DIAGNOSIS — R42 Dizziness and giddiness: Secondary | ICD-10-CM | POA: Diagnosis not present

## 2020-09-24 DIAGNOSIS — H8112 Benign paroxysmal vertigo, left ear: Secondary | ICD-10-CM | POA: Diagnosis not present

## 2020-09-25 ENCOUNTER — Other Ambulatory Visit: Payer: Self-pay | Admitting: Otolaryngology

## 2020-09-25 ENCOUNTER — Ambulatory Visit: Payer: Medicare Other | Admitting: Neurology

## 2020-09-25 DIAGNOSIS — H918X9 Other specified hearing loss, unspecified ear: Secondary | ICD-10-CM

## 2020-10-01 DIAGNOSIS — M150 Primary generalized (osteo)arthritis: Secondary | ICD-10-CM | POA: Diagnosis not present

## 2020-10-01 DIAGNOSIS — G5622 Lesion of ulnar nerve, left upper limb: Secondary | ICD-10-CM | POA: Diagnosis not present

## 2020-10-01 DIAGNOSIS — M25511 Pain in right shoulder: Secondary | ICD-10-CM | POA: Diagnosis not present

## 2020-10-01 DIAGNOSIS — Z6835 Body mass index (BMI) 35.0-35.9, adult: Secondary | ICD-10-CM | POA: Diagnosis not present

## 2020-10-01 DIAGNOSIS — E669 Obesity, unspecified: Secondary | ICD-10-CM | POA: Diagnosis not present

## 2020-10-01 DIAGNOSIS — M5030 Other cervical disc degeneration, unspecified cervical region: Secondary | ICD-10-CM | POA: Diagnosis not present

## 2020-10-01 DIAGNOSIS — L4050 Arthropathic psoriasis, unspecified: Secondary | ICD-10-CM | POA: Diagnosis not present

## 2020-10-01 DIAGNOSIS — L401 Generalized pustular psoriasis: Secondary | ICD-10-CM | POA: Diagnosis not present

## 2020-10-01 DIAGNOSIS — M5136 Other intervertebral disc degeneration, lumbar region: Secondary | ICD-10-CM | POA: Diagnosis not present

## 2020-10-06 ENCOUNTER — Ambulatory Visit
Admission: RE | Admit: 2020-10-06 | Discharge: 2020-10-06 | Disposition: A | Discharge status: Home / Self Care | Payer: Medicare Other | Source: Ambulatory Visit | Attending: Otolaryngology | Admitting: Otolaryngology

## 2020-10-06 ENCOUNTER — Other Ambulatory Visit: Payer: Self-pay

## 2020-10-06 DIAGNOSIS — H9190 Unspecified hearing loss, unspecified ear: Secondary | ICD-10-CM | POA: Diagnosis not present

## 2020-10-06 DIAGNOSIS — R42 Dizziness and giddiness: Secondary | ICD-10-CM | POA: Diagnosis not present

## 2020-10-06 DIAGNOSIS — I6782 Cerebral ischemia: Secondary | ICD-10-CM | POA: Diagnosis not present

## 2020-10-06 DIAGNOSIS — R296 Repeated falls: Secondary | ICD-10-CM | POA: Diagnosis not present

## 2020-10-06 DIAGNOSIS — H918X9 Other specified hearing loss, unspecified ear: Secondary | ICD-10-CM

## 2020-10-06 MED ORDER — GADOBENATE DIMEGLUMINE 529 MG/ML IV SOLN
20.0000 mL | Freq: Once | INTRAVENOUS | Status: AC | PRN
  Administered 2020-10-06: 20 mL via INTRAVENOUS

## 2020-10-09 ENCOUNTER — Telehealth: Payer: Self-pay | Admitting: Gastroenterology

## 2020-10-09 NOTE — Telephone Encounter (Signed)
Called and spoke to patient. Will send new instructions for her EGD w/ Dil that has been rescheduled for 9-1 to her MyChart.  New times reviewed with patient. Instructions sent

## 2020-10-09 NOTE — Telephone Encounter (Signed)
Inbound call from patient. Rescheduled her Endo Sav[facility] to 9/1. Requests a call with new instructions with the times

## 2020-10-16 ENCOUNTER — Encounter: Payer: Self-pay | Admitting: Certified Registered Nurse Anesthetist

## 2020-10-17 ENCOUNTER — Ambulatory Visit (AMBULATORY_SURGERY_CENTER): Payer: Medicare Other | Admitting: Gastroenterology

## 2020-10-17 ENCOUNTER — Encounter: Payer: Self-pay | Admitting: Gastroenterology

## 2020-10-17 ENCOUNTER — Other Ambulatory Visit: Payer: Self-pay | Admitting: Adult Health

## 2020-10-17 ENCOUNTER — Telehealth: Payer: Self-pay | Admitting: Adult Health

## 2020-10-17 ENCOUNTER — Other Ambulatory Visit: Payer: Self-pay

## 2020-10-17 VITALS — BP 149/79 | HR 54 | Temp 98.0°F | Resp 16 | Ht 65.0 in | Wt 219.0 lb

## 2020-10-17 DIAGNOSIS — I10 Essential (primary) hypertension: Secondary | ICD-10-CM | POA: Diagnosis not present

## 2020-10-17 DIAGNOSIS — K219 Gastro-esophageal reflux disease without esophagitis: Secondary | ICD-10-CM

## 2020-10-17 DIAGNOSIS — R1310 Dysphagia, unspecified: Secondary | ICD-10-CM

## 2020-10-17 DIAGNOSIS — K449 Diaphragmatic hernia without obstruction or gangrene: Secondary | ICD-10-CM

## 2020-10-17 DIAGNOSIS — I4891 Unspecified atrial fibrillation: Secondary | ICD-10-CM

## 2020-10-17 DIAGNOSIS — K319 Disease of stomach and duodenum, unspecified: Secondary | ICD-10-CM | POA: Diagnosis not present

## 2020-10-17 DIAGNOSIS — K317 Polyp of stomach and duodenum: Secondary | ICD-10-CM

## 2020-10-17 MED ORDER — SODIUM CHLORIDE 0.9 % IV SOLN
500.0000 mL | Freq: Once | INTRAVENOUS | Status: DC
Start: 2020-10-17 — End: 2021-02-27

## 2020-10-17 NOTE — Progress Notes (Signed)
[facility] History and Physical   Primary Care Physician:  [name], NP   Reason for Procedure:   Dysphagia, GERD  Plan:    EGD dilation     HPI: MERILDA TYNDALE is a 75 y.o. female with a history of hiatal hernia and dysphagia. Has had a remote EGD with empiric dilation with Azzie Almas and had a good response. Over time her dysphagia recurred and requesting dilation. GERD seems fairly well controlled. No cardiopulmonary symptoms.   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  1996   History of hiatal hernia    History of iron deficiency anemia 1996   iron infusion   History of migraine headaches    History of rib fracture    Hyperlipidemia    IBS (irritable bowel syndrome)    Insomnia    MCI (mild cognitive impairment)    Melanoma ([place]) 2019   left leg    Migraine headache    hx of migraines    OA (osteoarthritis)    RIGHT SHOULDER   Osteopenia    Pneumonia    remote history   PONV (postoperative nausea and vomiting)    Recovering alcoholic ([place])    SINCE [id number]   Right rotator cuff tear    Substance abuse ([place])    recovering alcoholic   Unspecified essential hypertension     Past Surgical History:  Procedure Laterality Date   BUNIONECTOMY/  HAMMERTOE CORRECTION  RIGHT FOOT  2011   CATARACT EXTRACTION W/ INTRAOCULAR LENS  IMPLANT, BILATERAL     COLONOSCOPY     KNEE ARTHROSCOPY W/ MENISCECTOMY Bilateral X2  LEFT /    X1  RIGHT   KNEE OPEN LATERAL RELEASE Bilateral    MOHS SURGERY  Left 12/15/2017   Melanoma in situ - left calf - [facility]   ORIF ANKLE FRACTURE Right 08/04/2020   Procedure: OPEN REDUCTION INTERNAL FIXATION (ORIF) ANKLE FRACTURE;  Surgeon: Wylene Simmer, MD;  Location: [facility];  Service: Orthopedics;  Laterality: Right;  Mini C-arm, [employer] Small Frag   REPLACEMENT TOTAL KNEE Left 2006   SHOULDER ARTHROSCOPY WITH SUBACROMIAL DECOMPRESSION, ROTATOR CUFF REPAIR AND BICEP TENDON REPAIR Right 05/23/2013   Procedure: RIGHT SHOULDER ARTHROSCOPY EXAM UNDER ANESTHESIA  WITH SUBACROMIAL DECOMPRESSION,DISTAL CLAVICLE RESECTION, SADLABRAL DEBRIDEMENT CHONDROPLASTY, BICEP TENOTOMY ;  Surgeon: Sydnee Cabal, MD;  Location: [place];  Service: Orthopedics;  Laterality: Right;   TONSILLECTOMY AND ADENOIDECTOMY  AGE 47   TOTAL HIP ARTHROPLASTY Left 05/04/2017   Procedure: LEFT TOTAL HIP ARTHROPLASTY ANTERIOR APPROACH;  Surgeon: Paralee Cancel, MD;  Location: [facility];  Service: Orthopedics;  Laterality: Left;   TOTAL HIP ARTHROPLASTY Right 08/01/2019   Procedure: TOTAL HIP ARTHROPLASTY ANTERIOR APPROACH;  Surgeon: Paralee Cancel, MD;  Location: [facility];  Service: Orthopedics;  Laterality: Right;  58mns   TOTAL KNEE ARTHROPLASTY Bilateral LEFT  1996/   RIGHT 2004   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 Authorizing Provider  acetaminophen (TYLENOL) 325 MG tablet 1 tablets as needed   Yes [provider]  Aspirin 81 MG CAPS Take by mouth. 1 Tab in the morning 1 tablet at bedtime   Yes [provider]  betamethasone dipropionate 0.05 % lotion APPLY EXTERNALLY TO THE AFFECTED AREA DAILY 06/26/20  Yes Nafziger, Tommi Rumps, NP  Calcium-Magnesium-Zinc (CAL-MAG-ZINC PO) Take 1 tablet by mouth every evening.   Yes [provider]  Cholecalciferol (VITAMIN D3) 125 MCG  (5000 UT) TABS Take 6,000 Units by mouth every evening.   Yes [provider]  escitalopram (LEXAPRO) 10 MG tablet Take 1.5 tablets (15 mg total) by mouth daily. 07/24/20  Yes [name], MD  famotidine (PEPCID) 20 MG tablet Take 1 tablet (20 mg total) by mouth daily as needed for heartburn or indigestion. 07/04/20  Yes Khamila Bassinger, [name], MD  FIBER PO Take 1 capsule by mouth in the morning and at bedtime.   Yes [provider]  hydrocortisone cream 1 % Apply 1 application topically daily as needed for itching.   Yes [provider]  L-Methylfolate-B12-B6-B2 (CEREFOLIN PO) Take 1 tablet by mouth daily.   Yes [provider]  losartan (COZAAR) 100 MG tablet Take 100 mg by mouth daily. 08/13/20  Yes [provider]  traZODone (DESYREL) 50 MG tablet TAKE 1 TABLET(50 MG) BY MOUTH AT BEDTIME 05/22/20  Yes Nafziger, Tommi Rumps, NP  HYDROcodone-acetaminophen (NORCO/VICODIN) 5-325 MG tablet Take 1 tablet by mouth every 6 (six) hours as needed. 10/05/20   [provider]  methotrexate (RHEUMATREX) 2.5 MG tablet Take 15 mg by mouth once a week. 07/08/20   [provider]  sulfamethoxazole-trimethoprim (BACTRIM,SEPTRA) 400-80 MG tablet Take 1 tablet by mouth daily. For urethritis.    [name], MD  SUMAtriptan (IMITREX) 100 MG tablet TAKE 1 TABLET BY MOUTH AS NEEDED FOR MIGRAINE. MAY REPEAT DOSE IN 2 HOURS IF HEADACHE PERSISTS OR RECURS 03/01/20   Nafziger, Tommi Rumps, NP  traMADol (ULTRAM) 50 MG tablet Take 50 mg by mouth every 4 (four) hours as needed. Patient not taking: Reported on 10/17/2020 09/27/20   [provider]    Current Outpatient Medications  Medication Sig Dispense Refill   acetaminophen (TYLENOL) 325 MG tablet 1 tablets as needed     Aspirin 81 MG CAPS Take by mouth. 1 Tab in the morning 1 tablet at bedtime     betamethasone dipropionate 0.05 % lotion APPLY EXTERNALLY TO THE AFFECTED AREA DAILY 60 mL 2   Calcium-Magnesium-Zinc  (CAL-MAG-ZINC PO) Take 1 tablet by mouth every evening.     Cholecalciferol (VITAMIN D3) 125 MCG (5000 UT) TABS Take 6,000 Units by mouth every evening.     escitalopram (LEXAPRO) 10 MG tablet Take 1.5 tablets (15 mg total) by mouth daily. 135 tablet 0   famotidine (PEPCID) 20 MG tablet Take 1 tablet (20 mg total) by mouth daily as needed for heartburn or indigestion. 90 tablet 3   FIBER PO Take 1 capsule by mouth in the morning and at bedtime.     hydrocortisone cream 1 % Apply 1 application topically daily as needed for itching.     L-Methylfolate-B12-B6-B2 (CEREFOLIN PO) Take 1 tablet by  mouth daily.     losartan (COZAAR) 100 MG tablet Take 100 mg by mouth daily.     traZODone (DESYREL) 50 MG tablet TAKE 1 TABLET(50 MG) BY MOUTH AT BEDTIME 90 tablet 1   HYDROcodone-acetaminophen (NORCO/VICODIN) 5-325 MG tablet Take 1 tablet by mouth every 6 (six) hours as needed.     methotrexate (RHEUMATREX) 2.5 MG tablet Take 15 mg by mouth once a week.     sulfamethoxazole-trimethoprim (BACTRIM,SEPTRA) 400-80 MG tablet Take 1 tablet by mouth daily. For urethritis.     SUMAtriptan (IMITREX) 100 MG tablet TAKE 1 TABLET BY MOUTH AS NEEDED FOR MIGRAINE. MAY REPEAT DOSE IN 2 HOURS IF HEADACHE PERSISTS OR RECURS 10 tablet 0   traMADol (ULTRAM) 50 MG tablet Take 50 mg by mouth every 4 (four) hours as needed. (Patient not taking: Reported on 10/17/2020)     Current Facility-Administered Medications  Medication Dose Route Frequency Provider Last Rate Last Admin   0.9 %  sodium chloride infusion  500 mL Intravenous Once Yetta Flock, MD        Allergies as of 10/17/2020 - Review Complete 10/17/2020  Allergen Reaction Noted   [place] Nail [apremilast] Anaphylaxis 03/15/2018   Penicillins Anaphylaxis    Erythromycin Other (See Comments) 07/04/2011   Morphine and related Nausea And Vomiting 05/15/2013   Nsaids Other (See Comments)    Remicade [infliximab] Other (See Comments) 03/01/2018   Tolmetin  10/19/2017    Nickel Rash 05/15/2013    Family History  Problem Relation Age of Onset   Heart disease Father 76   Hypertension Father    Melanoma Mother    Cancer Sister        skin CA, 2 sisters   Esophageal cancer Brother    Dementia Paternal Grandfather    Colon cancer Neg Hx    Colon polyps Neg Hx    Stomach cancer Neg Hx    Rectal cancer Neg Hx     Social History   Socioeconomic History   Marital status: Widowed    Spouse name: Not on file   Number of children: Not on file   Years of education: Not on file   Highest education level: Master's degree (e.g., MA, MS, MEng, MEd, MSW, MBA)  Occupational History   Not on file  Tobacco Use   Smoking status: Former    Packs/day: 0.50    Years: 15.00    Pack years: 7.50    Types: Cigarettes    Quit date: 05/15/1985    Years since quitting: 35.4   Smokeless tobacco: Never  Vaping Use   Vaping Use: Never used  Substance and Sexual Activity   Alcohol use: No    Comment: RECOVERING ALCOHOLIC--  QUIT [id number]   Drug use: No   Sexual activity: Never    Birth control/protection: Post-menopausal  Other Topics Concern   Not on file  Social History Narrative   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    Widowed    Three kids    97 grandchildren       Social Determinants of Radio broadcast assistant Strain: Low Risk    Difficulty of Paying Living Expenses: Not hard at all  Food Insecurity: No Food Insecurity   Worried About Charity fundraiser in the Last Year: Never true   Arboriculturist in the Last Year: Never true  Transportation Needs: No Data processing manager (Medical): No  Lack of Transportation (Non-Medical): No  Physical Activity: Insufficiently Active   Days of Exercise per Week: 7 days   Minutes of Exercise per Session: 20 min  Stress: No Stress Concern Present   Feeling of Stress : Only a little  Social Connections: Moderately Isolated   Frequency of  Communication with Friends and Family: More than three times a week   Frequency of Social Gatherings with Friends and Family: More than three times a week   Attends Religious Services: Never   Marine scientist or Organizations: Yes   Attends Archivist Meetings: 1 to 4 times per year   Marital Status: Widowed  Human resources officer Violence: Not At Risk   Fear of Current or Ex-Partner: No   Emotionally Abused: No   Physically Abused: No   Sexually Abused: No    Review of Systems: All other review of systems negative except as mentioned in the HPI.  Physical Exam: Vital signs BP 139/63   Pulse 70   Temp 98 F (36.7 C)   Ht '5\' 5"'$  (1.651 m)   Wt 219 lb (99.3 kg)   SpO2 95%   BMI 36.44 kg/m   General:   Alert,  Well-developed, well-nourished, pleasant and cooperative in NAD Lungs:  Clear throughout to auscultation.   Heart:  Regular rate and rhythm;  Abdomen:  Soft, nontender and nondistended.  Neuro/Psych:  Alert and cooperative. Normal mood and affect. A and O x 3  Jolly Mango, MD [facility]

## 2020-10-17 NOTE — Telephone Encounter (Signed)
PT called in to advise that during her procedure today she went AFIB and they are wanting her to inform her PCP that she needs a referral to a cardiologist to get everything checked out. Please advise.

## 2020-10-17 NOTE — Progress Notes (Signed)
DT- VS  Pt reported she fell and fx her right anlke 10 weeks ago.  Had surgery with hardware.  Kasandra Knudsen is under the stretcher.  Reported to [employer], CRNA. maw

## 2020-10-17 NOTE — Progress Notes (Signed)
Report given to PACU, vss 

## 2020-10-17 NOTE — Progress Notes (Signed)
1006 Esmolol 25 mg given IV due to freq PACs and small runs of a fib.  MD updated, vss

## 2020-10-17 NOTE — Patient Instructions (Addendum)
Call  your primary care physician for an appointment regarding your heart. Tell him/her that you need to be evaluated.   YOU HAD AN ENDOSCOPIC PROCEDURE TODAY AT [facility]:   Refer to the procedure report that was given to you for any specific questions about what was found during the examination.  If the procedure report does not answer your questions, please call your gastroenterologist to clarify.  If you requested that your care partner not be given the details of your procedure findings, then the procedure report has been included in a sealed envelope for you to review at your convenience later.  YOU SHOULD EXPECT: Some feelings of bloating in the abdomen. Passage of more gas than usual.  Walking can help get rid of the air that was put into your GI tract during the procedure and reduce the bloating.   Please Note:  You might notice some irritation and congestion in your nose or some drainage.  This is from the oxygen used during your procedure.  There is no need for concern and it should clear up in a day or so.  SYMPTOMS TO REPORT IMMEDIATELY:  Following upper endoscopy (EGD)  Vomiting of blood or coffee ground material  New chest pain or pain under the shoulder blades  Painful or persistently difficult swallowing  New shortness of breath  Fever of 100F or higher  Black, tarry-looking stools  For urgent or emergent issues, a gastroenterologist can be reached at any hour by calling [phone]. Do not use MyChart messaging for urgent concerns.    DIET:  We do recommend clear liquids until 11:30 (per Dr Havery Moros)  , and a soft diet for the rest of today. You may have a regular diet tomorrow..  Drink plenty of fluids but you should avoid alcoholic beverages for 24 hours.  ACTIVITY:  You should plan to take it easy for the rest of today and you should NOT DRIVE or use heavy machinery until tomorrow (because of the sedation medicines used during the test).     FOLLOW UP: Our staff will call the number listed on your records 48-72 hours following your procedure to check on you and address any questions or concerns that you may have regarding the information given to you following your procedure. If we do not reach you, we will leave a message.  We will attempt to reach you two times.  During this call, we will ask if you have developed any symptoms of COVID 19. If you develop any symptoms (ie: fever, flu-like symptoms, shortness of breath, cough etc.) before then, please call [phone].  If you test positive for Covid 19 in the 2 weeks post procedure, please call and report this information to [name].    If any biopsies were taken you will be contacted by phone or by letter within the next 1-3 weeks.  Please call us at [phone] if you have not heard about the biopsies in 3 weeks.    SIGNATURES/CONFIDENTIALITY: You and/or your care partner have signed paperwork which will be entered into your electronic medical record.  These signatures attest to the fact that that the information above on your After Visit Summary has been reviewed and is understood.  Full responsibility of the confidentiality of this discharge information lies with you and/or your care-partner.

## 2020-10-17 NOTE — Progress Notes (Signed)
Patient comes in and out of a-fib.  CRNA and doctor are aware. Dr ordered a 12 lead EKG. Patient stated that she could feel her heart race, but it "didn't last long."  Denies chest pain or shortness of breath.  EKG did show sinus bradycardia and no a-fib at this time.  Patient to call her primary care to discuss. Pt is in no distress.  Patient has been in [place]. Asymptomatic. Discharged with the fact that the patient will contact her Primary care,  Signs of heart issues discussed in full with patient with good feedback.

## 2020-10-17 NOTE — Op Note (Addendum)
[facility] Patient Name: Tonya Bartlett Procedure Date: 10/17/2020 9:54 AM MRN: [id number] Endoscopist: Remo Lipps P. Havery Moros , MD Age: 75 Referring MD:  Date of Birth: 1945-03-14 Gender: Female Account #: [id number] Procedure:                Upper GI endoscopy Indications:              Dysphagia, history of GERD - reflux controlled on                            pepcid. Had EGD in 2017 with good response to                            empiric dilation, dysphagia resolved for a few                            years, more recently with recurrence, patient                            requesting repeat dilation Medicines:                Monitored Anesthesia Care Procedure:                Pre-Anesthesia Assessment:                           - Prior to the procedure, a History and Physical                            was performed, and patient medications and                            allergies were reviewed. The patient's tolerance of                            previous anesthesia was also reviewed. The risks                            and benefits of the procedure and the sedation                            options and risks were discussed with the patient.                            All questions were answered, and informed consent                            was obtained. Prior Anticoagulants: The patient has                            taken no previous anticoagulant or antiplatelet                            agents. ASA Grade Assessment: II - A patient with  mild systemic disease. After reviewing the risks                            and benefits, the patient was deemed in                            satisfactory condition to undergo the procedure.                           After obtaining informed consent, the endoscope was                            passed under direct vision. Throughout the                            procedure, the patient's blood  pressure, pulse, and                            oxygen saturations were monitored continuously. The                            GIF [id number] was introduced through the                            mouth, and advanced to the second part of duodenum.                            The upper GI endoscopy was accomplished without                            difficulty. The patient tolerated the procedure                            well. Scope In: Scope Out: Findings:                 Esophagogastric landmarks were identified: the                            Z-line was found at 36 cm, the gastroesophageal                            junction was found at 36 cm and the upper extent of                            the gastric folds was found at 39 cm from the                            incisors.                           A 3 cm hiatal hernia was present.                           The exam of the  esophagus was otherwise normal. No                            focal stenosis / stricture noted.                           A guidewire was placed and the scope was withdrawn.                            Empiric dilation was performed in the entire                            esophagus with a Savary dilator with mild                            resistance at 17 mm, relook endoscopy showed no                            mucosal wrents.                           Patchy mildly erythematous mucosa was found in the                            gastric antrum. Biopsies were taken with a cold                            forceps for Helicobacter pylori testing.                           Multiple small sessile polyps were found in the                            gastric fundus and in the gastric body. One                            representative polyp biopsied in the fundus with a                            cold forceps for histology, appeared to be mostly                            removed.                           The exam  of the stomach was otherwise normal.                           The duodenal bulb and second portion of the                            duodenum were normal. Complications:            No immediate complications. Estimated blood loss:  Minimal. Patient had some PACs noted on the monitor                            during the procedure, given esmolol per anesthesia Estimated Blood Loss:     Estimated blood loss was minimal. Impression:               - Esophagogastric landmarks identified.                           - 3 cm hiatal hernia.                           - Normal esophagus otherwise - no focal stenosis /                            stricture appreciated. Empirically dilated to 37m.                           - Erythematous mucosa in the antrum. Biopsied to                            rule out H pylori.                           - Multiple gastric polyps, benign appearing. One                            representative polyp biopsied.                           - Normal stomach otherwise                           - Normal duodenal bulb and second portion of the                            duodenum. Recommendation:           - Patient has a contact number available for                            emergencies. The signs and symptoms of potential                            delayed complications were discussed with the                            patient. Return to normal activities tomorrow.                            Written discharge instructions were provided to the                            patient.                           -  Advance diet as tolerated.                           - Continue present medications.                           - Await pathology results.                           - Patient had some PACs on the monitor during the                            procedure, improved with esmolol, no persistent or                            definitive AF. EKD done  post procedure and shows                            sinus bradycardia, no AF, her baseline prior to the                            procedure. She is asymptomatic. BP stable. Upon                            further questioning she endorses palpitations at                            times. Counseled patient to contact her primary                            care today to further evaluate and may need                            cardiology evaluation. Otherwise hemodynamically                            stable / asymptomatic. Remo Lipps P. Havery Moros, MD 10/17/2020 10:29:52 AM This report has been signed electronically.

## 2020-10-17 NOTE — Progress Notes (Signed)
Called to room to assist during endoscopic procedure.  Patient ID and intended procedure confirmed with present staff. Received instructions for my participation in the procedure from the performing physician.  

## 2020-10-17 NOTE — Progress Notes (Signed)
1003 late entry Robinul 0.1 mg IV given due large amount of secretions upon assessment.  MD made aware, vss

## 2020-10-18 DIAGNOSIS — S82851D Displaced trimalleolar fracture of right lower leg, subsequent encounter for closed fracture with routine healing: Secondary | ICD-10-CM | POA: Diagnosis not present

## 2020-10-22 ENCOUNTER — Other Ambulatory Visit: Payer: Self-pay

## 2020-10-22 ENCOUNTER — Telehealth: Payer: Self-pay

## 2020-10-22 ENCOUNTER — Encounter: Payer: Self-pay | Admitting: *Deleted

## 2020-10-22 ENCOUNTER — Encounter: Payer: Self-pay | Admitting: Cardiovascular Disease

## 2020-10-22 ENCOUNTER — Ambulatory Visit (INDEPENDENT_AMBULATORY_CARE_PROVIDER_SITE_OTHER): Payer: Medicare Other | Admitting: Cardiovascular Disease

## 2020-10-22 DIAGNOSIS — I480 Paroxysmal atrial fibrillation: Secondary | ICD-10-CM

## 2020-10-22 DIAGNOSIS — Z8249 Family history of ischemic heart disease and other diseases of the circulatory system: Secondary | ICD-10-CM

## 2020-10-22 DIAGNOSIS — I10 Essential (primary) hypertension: Secondary | ICD-10-CM | POA: Diagnosis not present

## 2020-10-22 NOTE — Patient Instructions (Signed)
Medication Instructions:  Your physician recommends that you continue on your current medications as directed. Please refer to the Current Medication list given to you today.  Testing/Procedures: Your physician has requested that you have an echocardiogram. Echocardiography is a painless test that uses sound waves to create images of your heart. It provides your doctor with information about the size and shape of your heart and how well your heart's chambers and valves are working. This procedure takes approximately one hour. There are no restrictions for this procedure. This will be done at our [facility] location:  [address]  CT coronary calcium score. This test is done at [address]. This is $99 out of pocket.   Coronary CalciumScan A coronary calcium scan is an imaging test used to look for deposits of calcium and other fatty materials (plaques) in the inner lining of the blood vessels of the heart (coronary arteries). These deposits of calcium and plaques can partly clog and narrow the coronary arteries without producing any symptoms or warning signs. This puts a person at risk for a heart attack. This test can detect these deposits before symptoms develop. Tell a health care provider about: Any allergies you have. All medicines you are taking, including vitamins, herbs, eye drops, creams, and over-the-counter medicines. Any problems you or family members have had with anesthetic medicines. Any blood disorders you have. Any surgeries you have had. Any medical conditions you have. Whether you are pregnant or may be pregnant. What are the risks? Generally, this is a safe procedure. However, problems may occur, including: Harm to a pregnant woman and her unborn baby. This test involves the use of radiation. Radiation exposure can be dangerous to a pregnant woman and her unborn baby. If you are pregnant, you generally should not have this procedure  done. Slight increase in the risk of cancer. This is because of the radiation involved in the test. What happens before the procedure? No preparation is needed for this procedure. What happens during the procedure? You will undress and remove any jewelry around your neck or chest. You will put on a hospital gown. Sticky electrodes will be placed on your chest. The electrodes will be connected to an electrocardiogram (ECG) machine to record a tracing of the electrical activity of your heart. A CT scanner will take pictures of your heart. During this time, you will be asked to lie still and hold your breath for 2-3 seconds while a picture of your heart is being taken. The procedure may vary among health care providers and hospitals. What happens after the procedure? You can get dressed. You can return to your normal activities. It is up to you to get the results of your test. Ask your health care provider, or the department that is doing the test, when your results will be ready. Summary A coronary calcium scan is an imaging test used to look for deposits of calcium and other fatty materials (plaques) in the inner lining of the blood vessels of the heart (coronary arteries). Generally, this is a safe procedure. Tell your health care provider if you are pregnant or may be pregnant. No preparation is needed for this procedure. A CT scanner will take pictures of your heart. You can return to your normal activities after the scan is done. This information is not intended to replace advice given to you by your health care provider. Make sure you discuss any questions you have with your  health care provider. Document Released: 08/01/2007 Document Revised: 12/23/2015 Document Reviewed: 12/23/2015 Elsevier Interactive Patient Education  2017 [place] physician has recommended that you wear an event monitor (30 day). Event monitors are medical devices that record the heart's electrical  activity. Doctors most often [name] these monitors to diagnose arrhythmias. Arrhythmias are problems with the speed or rhythm of the heartbeat. The monitor is a small, portable device. You can wear one while you do your normal daily activities. This is usually used to diagnose what is causing palpitations/syncope (passing out).  Follow-Up: At [facility], you and your health needs are our priority.  As part of our continuing mission to provide you with exceptional heart care, we have created designated Provider Care Teams.  These Care Teams include your primary Cardiologist (physician) and Advanced Practice Providers (APPs -  Physician Assistants and Nurse Practitioners) who all work together to provide you with the care you need, when you need it.  We recommend signing up for the patient portal called "MyChart".  Sign up information is provided on this After Visit Summary.  MyChart is used to connect with patients for Virtual Visits (Telemedicine).  Patients are able to view lab/test results, encounter notes, upcoming appointments, etc.  Non-urgent messages can be sent to your provider as well.   To learn more about what you can do with MyChart, go to [email or web address].    Your next appointment:   6-8 week(s)  The format for your next appointment:   In Person  Provider:   Quay Burow, MD

## 2020-10-22 NOTE — Assessment & Plan Note (Signed)
Patient was noted to be in PAF when she underwent recent endoscopy by Dr. Havery Moros.  She says she has had palpitations off and on for the last 6 to 12 months.  I am going to get a 2D echo and a 30-day event monitor to further evaluate.  Her TSH checked 04/23/2020 was normal.

## 2020-10-22 NOTE — Assessment & Plan Note (Signed)
Father died at age 76 of a myocardial infarction.

## 2020-10-22 NOTE — Assessment & Plan Note (Signed)
History of essential hypertension a blood pressure measured today 134/70.  She is on losartan.

## 2020-10-22 NOTE — Progress Notes (Signed)
10/22/2020 Tonya Bartlett   1945/12/26  [id number]  Primary Physician Nafziger, Tommi Rumps, NP Primary Cardiologist: [name] MD [name], [place]  HPI:  Tonya Bartlett is a 75 y.o. moderately overweight widowed Caucasian female mother of 3 children, grandmother of 6 grandchildren who is still a Programme researcher, broadcasting/film/video.  She was referred by Dr. Havery Moros, her gastroenterologist, for evaluation of recently recognized PAF.  Her cardiac risk factors are positive for treated hypertension and family history.  Father died of a myocardial infarction at age 63.  She is never had a heart attack or stroke.  She denies chest pain or shortness of breath.  She has had multiple joint replacements including 3 total knee replacements, 2 total hip replacements and 2 shoulder surgeries.  She recently broke her ankle and had orthopedic surgery on this 10 weeks ago.  She is noticed palpitations over the last 6 to 12 months occurring for minutes at a time, usually when she is quiet and resting.   Current Meds  Medication Sig   acetaminophen (TYLENOL) 325 MG tablet 1 tablets as needed   Aspirin 81 MG CAPS Take by mouth. 1 Tab in the morning 1 tablet at bedtime   betamethasone dipropionate 0.05 % lotion APPLY EXTERNALLY TO THE AFFECTED AREA DAILY   Calcium-Magnesium-Zinc (CAL-MAG-ZINC PO) Take 1 tablet by mouth every evening.   Cholecalciferol (VITAMIN D3) 125 MCG (5000 UT) TABS Take 6,000 Units by mouth every evening.   escitalopram (LEXAPRO) 10 MG tablet Take 1.5 tablets (15 mg total) by mouth daily.   famotidine (PEPCID) 20 MG tablet Take 1 tablet (20 mg total) by mouth daily as needed for heartburn or indigestion.   FIBER PO Take 1 capsule by mouth in the morning and at bedtime.   HYDROcodone-acetaminophen (NORCO/VICODIN) 5-325 MG tablet Take 1 tablet by mouth every 6 (six) hours as needed.   hydrocortisone cream 1 % Apply 1 application topically daily as needed for itching.    L-Methylfolate-B12-B6-B2 (CEREFOLIN PO) Take 1 tablet by mouth daily.   losartan (COZAAR) 100 MG tablet Take 100 mg by mouth daily.   methotrexate (RHEUMATREX) 2.5 MG tablet Take 15 mg by mouth once a week.   sulfamethoxazole-trimethoprim (BACTRIM,SEPTRA) 400-80 MG tablet Take 1 tablet by mouth daily. For urethritis.   SUMAtriptan (IMITREX) 100 MG tablet TAKE 1 TABLET BY MOUTH AS NEEDED FOR MIGRAINE. MAY REPEAT DOSE IN 2 HOURS IF HEADACHE PERSISTS OR RECURS   traMADol (ULTRAM) 50 MG tablet Take 50 mg by mouth every 4 (four) hours as needed.   traZODone (DESYREL) 50 MG tablet TAKE 1 TABLET(50 MG) BY MOUTH AT BEDTIME   Current Facility-Administered Medications for the 10/22/20 encounter (Office Visit) with [name], MD  Medication   0.9 %  sodium chloride infusion     Allergies  Allergen Reactions   Otezla [Apremilast] Anaphylaxis    Suicidal ideation   Penicillins Anaphylaxis    Has patient had a PCN reaction causing immediate rash, facial/tongue/throat swelling, SOB or lightheadedness with hypotension: Yes Has patient had a PCN reaction causing severe rash involving mucus membranes or skin necrosis: Yes Has patient had a PCN reaction that required hospitalization: No Has patient had a PCN reaction occurring within the last 10 year No If all of the above answers are "NO", then may proceed with Cephalosporin use.    Erythromycin Other (See Comments)    SEVERE STOMACH CRAMPS   Morphine And Related Nausea And Vomiting   Nsaids Other (  See Comments)    SEVERE STOMACH CRAMPS, MOUTH SORES **Able to tolerate Tylenol   Remicade [Infliximab] Other (See Comments)    Shut down immune system-BP high   Tolmetin     SEVERE STOMACH CRAMPS   Nickel Rash    Including snaps on hospital gowns     Social History   Socioeconomic History   Marital status: Widowed    Spouse name: Not on file   Number of children: Not on file   Years of education: Not on file   Highest education level:  Master's degree (e.g., MA, MS, MEng, MEd, MSW, MBA)  Occupational History   Not on file  Tobacco Use   Smoking status: Former    Packs/day: 0.50    Years: 15.00    Pack years: 7.50    Types: Cigarettes    Quit date: 05/15/1985    Years since quitting: 35.4   Smokeless tobacco: Never  Vaping Use   Vaping Use: Never used  Substance and Sexual Activity   Alcohol use: No    Comment: RECOVERING ALCOHOLIC--  QUIT [id number]   Drug use: No   Sexual activity: Never    Birth control/protection: Post-menopausal  Other Topics Concern   Not on file  Social History Narrative   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    Widowed    Three kids    54 grandchildren       Social Determinants of Radio broadcast assistant Strain: Low Risk    Difficulty of Paying Living Expenses: Not hard at all  Food Insecurity: No Food Insecurity   Worried About Charity fundraiser in the Last Year: Never true   Arboriculturist in the Last Year: Never true  Transportation Needs: No Transportation Needs   Lack of Transportation (Medical): No   Lack of Transportation (Non-Medical): No  Physical Activity: Insufficiently Active   Days of Exercise per Week: 7 days   Minutes of Exercise per Session: 20 min  Stress: No Stress Concern Present   Feeling of Stress : Only a little  Social Connections: Moderately Isolated   Frequency of Communication with Friends and Family: More than three times a week   Frequency of Social Gatherings with Friends and Family: More than three times a week   Attends Religious Services: Never   Marine scientist or Organizations: Yes   Attends Archivist Meetings: 1 to 4 times per year   Marital Status: Widowed  Human resources officer Violence: Not At Risk   Fear of Current or Ex-Partner: No   Emotionally Abused: No   Physically Abused: No   Sexually Abused: No     Review of Systems: General: negative for chills, fever, night  sweats or weight changes.  Cardiovascular: negative for chest pain, dyspnea on exertion, edema, orthopnea, palpitations, paroxysmal nocturnal dyspnea or shortness of breath Dermatological: negative for rash Respiratory: negative for cough or wheezing Urologic: negative for hematuria Abdominal: negative for nausea, vomiting, diarrhea, bright red blood per rectum, melena, or hematemesis Neurologic: negative for visual changes, syncope, or dizziness All other systems reviewed and are otherwise negative except as noted above.    Blood pressure 134/70, pulse 76, height '5\' 5"'$  (1.651 m), weight 220 lb (99.8 kg).  General appearance: alert and no distress Neck: no adenopathy, no carotid bruit, no JVD, supple, symmetrical, trachea midline, and thyroid not enlarged, symmetric, no tenderness/mass/nodules Lungs: clear to auscultation bilaterally Heart: regular rate  and rhythm, S1, S2 normal, no murmur, click, rub or gallop Extremities: extremities normal, atraumatic, no cyanosis or edema Pulses: 2+ and symmetric Skin: Skin color, texture, turgor normal. No rashes or lesions Neurologic: Grossly normal  EKG sinus rhythm at 76 with LVH voltage and septal Q waves.  I personally reviewed this EKG.  ASSESSMENT AND PLAN:   Essential hypertension, benign History of essential hypertension a blood pressure measured today 134/70.  She is on losartan.  PAF (paroxysmal atrial fibrillation) ([place]) Patient was noted to be in PAF when she underwent recent endoscopy by Dr. Havery Moros.  She says she has had palpitations off and on for the last 6 to 12 months.  I am going to get a 2D echo and a 30-day event monitor to further evaluate.  Her TSH checked 04/23/2020 was normal.  Family history of heart disease Father died at age 67 of a myocardial infarction.     [name] MD FACP,FACC,FAHA, [facility] 10/22/2020 3:11 PM

## 2020-10-22 NOTE — Telephone Encounter (Signed)
  Follow up Call-  Call back number 10/17/2020 03/15/2018  Post procedure Call Back phone  # [phone] cell [phone]  Permission to leave phone message Yes Yes  Some recent data might be hidden     Patient questions:  Do you have a fever, pain , or abdominal swelling? No. Pain Score  0 *  Have you tolerated food without any problems? Yes.    Have you been able to return to your normal activities? Yes.    Do you have any questions about your discharge instructions: Diet   No. Medications  No. Follow up visit  No.  Do you have questions or concerns about your Care? No.  Actions: * If pain score is 4 or above: No action needed, pain <4.  Have you developed a fever since your procedure? No   2.   Have you had an respiratory symptoms (SOB or cough) since your procedure? No   3.   Have you tested positive for COVID 19 since your procedure no   4.   Have you had any family members/close contacts diagnosed with the COVID 19 since your procedure?  No    If yes to any of these questions please route to Joylene John, RN and Joella Prince, RN

## 2020-10-24 DIAGNOSIS — R42 Dizziness and giddiness: Secondary | ICD-10-CM | POA: Diagnosis not present

## 2020-10-24 DIAGNOSIS — H832X3 Labyrinthine dysfunction, bilateral: Secondary | ICD-10-CM | POA: Diagnosis not present

## 2020-10-27 ENCOUNTER — Ambulatory Visit (INDEPENDENT_AMBULATORY_CARE_PROVIDER_SITE_OTHER): Payer: Medicare Other

## 2020-10-27 DIAGNOSIS — I480 Paroxysmal atrial fibrillation: Secondary | ICD-10-CM

## 2020-10-28 DIAGNOSIS — I480 Paroxysmal atrial fibrillation: Secondary | ICD-10-CM | POA: Diagnosis not present

## 2020-10-28 DIAGNOSIS — R42 Dizziness and giddiness: Secondary | ICD-10-CM | POA: Diagnosis not present

## 2020-10-30 DIAGNOSIS — M25572 Pain in left ankle and joints of left foot: Secondary | ICD-10-CM | POA: Diagnosis not present

## 2020-10-30 DIAGNOSIS — Z20822 Contact with and (suspected) exposure to covid-19: Secondary | ICD-10-CM | POA: Diagnosis not present

## 2020-11-04 DIAGNOSIS — M25571 Pain in right ankle and joints of right foot: Secondary | ICD-10-CM | POA: Diagnosis not present

## 2020-11-06 DIAGNOSIS — M25571 Pain in right ankle and joints of right foot: Secondary | ICD-10-CM | POA: Diagnosis not present

## 2020-11-07 ENCOUNTER — Ambulatory Visit: Payer: Medicare Other | Admitting: Neurology

## 2020-11-11 DIAGNOSIS — M25571 Pain in right ankle and joints of right foot: Secondary | ICD-10-CM | POA: Diagnosis not present

## 2020-11-13 ENCOUNTER — Other Ambulatory Visit (HOSPITAL_COMMUNITY): Payer: Self-pay | Admitting: Psychiatry

## 2020-11-13 DIAGNOSIS — M25571 Pain in right ankle and joints of right foot: Secondary | ICD-10-CM | POA: Diagnosis not present

## 2020-11-14 DIAGNOSIS — R42 Dizziness and giddiness: Secondary | ICD-10-CM | POA: Diagnosis not present

## 2020-11-14 DIAGNOSIS — H9072 Mixed conductive and sensorineural hearing loss, unilateral, left ear, with unrestricted hearing on the contralateral side: Secondary | ICD-10-CM | POA: Diagnosis not present

## 2020-11-14 DIAGNOSIS — H8112 Benign paroxysmal vertigo, left ear: Secondary | ICD-10-CM | POA: Diagnosis not present

## 2020-11-18 DIAGNOSIS — M25571 Pain in right ankle and joints of right foot: Secondary | ICD-10-CM | POA: Diagnosis not present

## 2020-11-19 ENCOUNTER — Other Ambulatory Visit: Payer: Self-pay | Admitting: Adult Health

## 2020-11-19 DIAGNOSIS — Z760 Encounter for issue of repeat prescription: Secondary | ICD-10-CM

## 2020-11-20 ENCOUNTER — Ambulatory Visit: Payer: Medicare Other | Admitting: Neurology

## 2020-11-20 ENCOUNTER — Encounter: Payer: Self-pay | Admitting: Neurology

## 2020-11-20 DIAGNOSIS — M25571 Pain in right ankle and joints of right foot: Secondary | ICD-10-CM | POA: Diagnosis not present

## 2020-11-21 DIAGNOSIS — M25571 Pain in right ankle and joints of right foot: Secondary | ICD-10-CM | POA: Diagnosis not present

## 2020-11-26 DIAGNOSIS — Z23 Encounter for immunization: Secondary | ICD-10-CM | POA: Diagnosis not present

## 2020-11-29 DIAGNOSIS — M25571 Pain in right ankle and joints of right foot: Secondary | ICD-10-CM | POA: Diagnosis not present

## 2020-12-04 ENCOUNTER — Other Ambulatory Visit: Payer: Self-pay | Admitting: Neurology

## 2020-12-04 ENCOUNTER — Telehealth (INDEPENDENT_AMBULATORY_CARE_PROVIDER_SITE_OTHER): Payer: Medicare Other | Admitting: Psychiatry

## 2020-12-04 ENCOUNTER — Encounter (HOSPITAL_COMMUNITY): Payer: Self-pay | Admitting: Psychiatry

## 2020-12-04 ENCOUNTER — Other Ambulatory Visit: Payer: Self-pay

## 2020-12-04 DIAGNOSIS — F331 Major depressive disorder, recurrent, moderate: Secondary | ICD-10-CM

## 2020-12-04 NOTE — Progress Notes (Signed)
[facility] Outpatient visit Tele psych  Patient Identification: Tonya Bartlett MRN:  [id number] Date of Evaluation:  12/04/2020 Referral Source: NP Chief Complaint:   Depression follow up  Visit Diagnosis:    ICD-10-CM   1. Major depressive disorder, recurrent episode, moderate ([place])  F33.1      Virtual Visit via Telephone Note  I connected with Tonya Bartlett on 12/04/20 at 10:00 AM EDT by telephone and verified that I am speaking with the correct person using two identifiers.  Location: Patient: home Provider: home office   I discussed the limitations, risks, security and privacy concerns of performing an evaluation and management service by telephone and the availability of in person appointments. I also discussed with the patient that there may be a patient responsible charge related to this service. The patient expressed understanding and agreed to proceed.     I discussed the assessment and treatment plan with the patient. The patient was provided an opportunity to ask questions and all were answered. The patient agreed with the plan and demonstrated an understanding of the instructions.   The patient was advised to call back or seek an in-person evaluation if the symptoms worsen or if the condition fails to improve as anticipated.  I provided 15 minutes of non-face-to-face time during this encounter.    History of Present Illness: 75 years old currently widowed white female lives by herself has 3 grown kids and 6 grandkids referred initially by family medicine for management of depression   Remains sober 40 years plus and active in [place]  Patient had fall mid June injured her ankle had a surgery and then had to go through rehab at nursing home had a difficult time of did have a good support system including her son and also now daughter is communicating as well  She has checked her dizziness with her provider including ENT no etiology yet she understands to be cautious while  ambulating She does not feel cutting down the Lexapro Lexapro at 50 mg has been helping   Modifying factors : Family, son, daughter is now communicating aggravating factors.  Hip replacement surgery.  Recent fall and ankle surgery Past Psychiatric History: depression  Previous Psychotropic Medications: Yes   Substance Abuse History in the last 12 months:  No.  Consequences of Substance Abuse: NA  Past Medical History:  Past Medical History:  Diagnosis Date   Allergic rhinitis    Allergy    Anemia    Anxiety    Blood transfusion without reported diagnosis    Cataract    bil cataracts removed   Clotting disorder ([place])    DVT- 1996 po knee replacement   Colitis    Complication of anesthesia    vomit x1 while on Morphine per pt   Depression    Diverticulosis    Frequency of urination    GERD (gastroesophageal reflux disease)    History of colon polyps    History of DVT of lower extremity    POST LEFT TOTAL KNEE  1996   History of hiatal hernia    History of iron deficiency anemia 1996   iron infusion   History of migraine headaches    History of rib fracture    Hyperlipidemia    IBS (irritable bowel syndrome)    Insomnia    MCI (mild cognitive impairment)    Melanoma ([place]) 2019   left leg    Migraine headache    hx of migraines    OA (  osteoarthritis)    RIGHT SHOULDER   Osteopenia    Pneumonia    remote history   PONV (postoperative nausea and vomiting)    Recovering alcoholic ([place])    SINCE [id number]   Right rotator cuff tear    Substance abuse ([place])    recovering alcoholic   Unspecified essential hypertension     Past Surgical History:  Procedure Laterality Date   BUNIONECTOMY/  HAMMERTOE CORRECTION  RIGHT FOOT  2011   CATARACT EXTRACTION W/ INTRAOCULAR LENS  IMPLANT, BILATERAL     COLONOSCOPY     KNEE ARTHROSCOPY W/ MENISCECTOMY Bilateral X2  LEFT /    X1  RIGHT   KNEE OPEN LATERAL RELEASE Bilateral    MOHS SURGERY Left 12/15/2017   Melanoma in  situ - left calf - [facility]   ORIF ANKLE FRACTURE Right 08/04/2020   Procedure: OPEN REDUCTION INTERNAL FIXATION (ORIF) ANKLE FRACTURE;  Surgeon: Wylene Simmer, MD;  Location: [facility];  Service: Orthopedics;  Laterality: Right;  Mini C-arm, [employer] Small Frag   REPLACEMENT TOTAL KNEE Left 2006   SHOULDER ARTHROSCOPY WITH SUBACROMIAL DECOMPRESSION, ROTATOR CUFF REPAIR AND BICEP TENDON REPAIR Right 05/23/2013   Procedure: RIGHT SHOULDER ARTHROSCOPY EXAM UNDER ANESTHESIA  WITH SUBACROMIAL DECOMPRESSION,DISTAL CLAVICLE RESECTION, SADLABRAL DEBRIDEMENT CHONDROPLASTY, BICEP TENOTOMY ;  Surgeon: Sydnee Cabal, MD;  Location: [facility];  Service: Orthopedics;  Laterality: Right;   TONSILLECTOMY AND ADENOIDECTOMY  AGE 57   TOTAL HIP ARTHROPLASTY Left 05/04/2017   Procedure: LEFT TOTAL HIP ARTHROPLASTY ANTERIOR APPROACH;  Surgeon: Paralee Cancel, MD;  Location: [facility];  Service: Orthopedics;  Laterality: Left;   TOTAL HIP ARTHROPLASTY Right 08/01/2019   Procedure: TOTAL HIP ARTHROPLASTY ANTERIOR APPROACH;  Surgeon: Paralee Cancel, MD;  Location: [facility];  Service: Orthopedics;  Laterality: Right;  33mins   TOTAL KNEE ARTHROPLASTY Bilateral LEFT  1996/   RIGHT 2004   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      UPPER GASTROINTESTINAL ENDOSCOPY     UPPER GI ENDOSCOPY     VAGINAL HYSTERECTOMY  1976    Family Psychiatric History: mom possible depression  Family History:  Family History  Problem Relation Age of Onset   Heart disease Father 26   Hypertension Father    Melanoma Mother    Cancer Sister        skin CA, 2 sisters   Esophageal cancer Brother    Dementia Paternal Grandfather    Colon cancer Neg Hx    Colon polyps Neg Hx    Stomach cancer Neg Hx    Rectal cancer Neg Hx     Social History:   Social History   Socioeconomic History   Marital status: Widowed    Spouse name: Not on file   Number of children: Not on file   Years of education: Not on file   Highest  education level: Master's degree (e.g., MA, MS, MEng, MEd, MSW, MBA)  Occupational History   Not on file  Tobacco Use   Smoking status: Former    Packs/day: 0.50    Years: 15.00    Pack years: 7.50    Types: Cigarettes    Quit date: 05/15/1985    Years since quitting: 35.5   Smokeless tobacco: Never  Vaping Use   Vaping Use: Never used  Substance and Sexual Activity   Alcohol use: No    Comment: RECOVERING ALCOHOLIC--  QUIT [id number]   Drug use: No   Sexual activity: Never  Birth control/protection: Post-menopausal  Other Topics Concern   Not on file  Social History Narrative   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    Widowed    Three kids    15 grandchildren       Social Determinants of Radio broadcast assistant Strain: Low Risk    Difficulty of Paying Living Expenses: Not hard at all  Food Insecurity: No Food Insecurity   Worried About Charity fundraiser in the Last Year: Never true   Arboriculturist in the Last Year: Never true  Transportation Needs: No Transportation Needs   Lack of Transportation (Medical): No   Lack of Transportation (Non-Medical): No  Physical Activity: Insufficiently Active   Days of Exercise per Week: 7 days   Minutes of Exercise per Session: 20 min  Stress: No Stress Concern Present   Feeling of Stress : Only a little  Social Connections: Moderately Isolated   Frequency of Communication with Friends and Family: More than three times a week   Frequency of Social Gatherings with Friends and Family: More than three times a week   Attends Religious Services: Never   Marine scientist or Organizations: Yes   Attends Archivist Meetings: 1 to 4 times per year   Marital Status: Widowed     Allergies:   Allergies  Allergen Reactions   Otezla [Apremilast] Anaphylaxis    Suicidal ideation   Penicillins Anaphylaxis    Has patient had a PCN reaction causing immediate rash,  facial/tongue/throat swelling, SOB or lightheadedness with hypotension: Yes Has patient had a PCN reaction causing severe rash involving mucus membranes or skin necrosis: Yes Has patient had a PCN reaction that required hospitalization: No Has patient had a PCN reaction occurring within the last 10 year No If all of the above answers are "NO", then may proceed with Cephalosporin use.    Erythromycin Other (See Comments)    SEVERE STOMACH CRAMPS   Morphine And Related Nausea And Vomiting   Nsaids Other (See Comments)    SEVERE STOMACH CRAMPS, MOUTH SORES **Able to tolerate Tylenol   Remicade [Infliximab] Other (See Comments)    Shut down immune system-BP high   Tolmetin     SEVERE STOMACH CRAMPS   Nickel Rash    Including snaps on hospital gowns     Metabolic Disorder Labs: Lab Results  Component Value Date   HGBA1C 5.3 11/05/2016   No results found for: PROLACTIN Lab Results  Component Value Date   CHOL 177 04/23/2020   TRIG 76.0 04/23/2020   HDL 69.40 04/23/2020   CHOLHDL 3 04/23/2020   VLDL 15.2 04/23/2020   LDLCALC 92 04/23/2020   LDLCALC 100 (H) 02/03/2019     Current Medications: Current Outpatient Medications  Medication Sig Dispense Refill   acetaminophen (TYLENOL) 325 MG tablet 1 tablets as needed     Aspirin 81 MG CAPS Take by mouth. 1 Tab in the morning 1 tablet at bedtime     betamethasone dipropionate 0.05 % lotion APPLY TOPICALLY TO THE AFFECTED AREA DAILY 60 mL 2   Calcium-Magnesium-Zinc (CAL-MAG-ZINC PO) Take 1 tablet by mouth every evening.     Cholecalciferol (VITAMIN D3) 125 MCG (5000 UT) TABS Take 6,000 Units by mouth every evening.     escitalopram (LEXAPRO) 10 MG tablet TAKE 1 AND 1/2 TABLETS(15 MG) BY MOUTH DAILY 135 tablet 0   famotidine (PEPCID) 20 MG tablet Take 1 tablet (  20 mg total) by mouth daily as needed for heartburn or indigestion. 90 tablet 3   FIBER PO Take 1 capsule by mouth in the morning and at bedtime.      HYDROcodone-acetaminophen (NORCO/VICODIN) 5-325 MG tablet Take 1 tablet by mouth every 6 (six) hours as needed.     hydrocortisone cream 1 % Apply 1 application topically daily as needed for itching.     L-Methylfolate-B12-B6-B2 (CEREFOLIN PO) Take 1 tablet by mouth daily.     losartan (COZAAR) 100 MG tablet Take 100 mg by mouth daily.     methotrexate (RHEUMATREX) 2.5 MG tablet Take 15 mg by mouth once a week.     sulfamethoxazole-trimethoprim (BACTRIM,SEPTRA) 400-80 MG tablet Take 1 tablet by mouth daily. For urethritis.     SUMAtriptan (IMITREX) 100 MG tablet TAKE 1 TABLET BY MOUTH AS NEEDED FOR MIGRAINE. MAY REPEAT DOSE IN 2 HOURS IF HEADACHE PERSISTS OR RECURS 10 tablet 0   traMADol (ULTRAM) 50 MG tablet Take 50 mg by mouth every 4 (four) hours as needed.     traZODone (DESYREL) 50 MG tablet TAKE 1 TABLET(50 MG) BY MOUTH AT BEDTIME 90 tablet 1   Current Facility-Administered Medications  Medication Dose Route Frequency Provider Last Rate Last Admin   0.9 %  sodium chloride infusion  500 mL Intravenous Once Armbruster, [name], MD         Psychiatric Specialty Exam: Review of Systems  Cardiovascular:  Negative for chest pain.  Psychiatric/Behavioral:  Negative for suicidal ideas.    There were no vitals taken for this visit.There is no height or weight on file to calculate BMI.  General Appearance:   Eye Contact:    Speech:  Normal Rate  Volume:  Decreased  Mood: fair  Affect:   Thought Process:  Goal Directed  Orientation:  Full (Time, Place, and Person)  Thought Content:  Rumination  Suicidal Thoughts:  No  Homicidal Thoughts:  No  Memory:  Immediate;   Fair Recent;   Fair  Judgement:  Fair  Insight:  Fair  Psychomotor Activity:  Normal  Concentration:  Concentration: Fair and Attention Span: Fair  Recall:  [employer] of Knowledge:Good  Language: Good  Akathisia:  No  Handed:  Right  AIMS (if indicated):    Assets:  Desire for Improvement  ADL's:  Intact   Cognition: Impaired,  Mild  Sleep:  Variable to fair on meds    Treatment Plan Summary: Medication management and Plan as follows    Prior documentation reviewed 1. MDD, mild to moderate: Despite her ankle injury and surgery she is doing reasonable mood wise she believes Lexapro does help she will continue and does have a good support system  Alcohol dependence: Sustained remission and active in AA  follow-up in 4 months Has meds for now  [name], MD 10/19/202210:15 AM

## 2020-12-06 ENCOUNTER — Ambulatory Visit
Admission: RE | Admit: 2020-12-06 | Discharge: 2020-12-06 | Disposition: A | Discharge status: Home / Self Care | Payer: Self-pay | Source: Ambulatory Visit | Attending: Cardiovascular Disease | Admitting: Cardiovascular Disease

## 2020-12-06 ENCOUNTER — Other Ambulatory Visit: Payer: Self-pay

## 2020-12-06 ENCOUNTER — Ambulatory Visit (HOSPITAL_COMMUNITY): Payer: Medicare Other | Attending: Cardiology

## 2020-12-06 DIAGNOSIS — I480 Paroxysmal atrial fibrillation: Secondary | ICD-10-CM | POA: Diagnosis not present

## 2020-12-06 DIAGNOSIS — I10 Essential (primary) hypertension: Secondary | ICD-10-CM

## 2020-12-06 DIAGNOSIS — Z8249 Family history of ischemic heart disease and other diseases of the circulatory system: Secondary | ICD-10-CM

## 2020-12-06 LAB — ECHOCARDIOGRAM COMPLETE
AR max vel: 2.81 cm2
AV Area VTI: 2.8 cm2
AV Area mean vel: 2.75 cm2
AV Mean grad: 9 mmHg
AV Peak grad: 17 mmHg
Ao pk vel: 2.06 m/s
Area-P 1/2: 2.93 cm2
S' Lateral: 3.1 cm

## 2020-12-09 DIAGNOSIS — L821 Other seborrheic keratosis: Secondary | ICD-10-CM | POA: Diagnosis not present

## 2020-12-09 DIAGNOSIS — Z08 Encounter for follow-up examination after completed treatment for malignant neoplasm: Secondary | ICD-10-CM | POA: Diagnosis not present

## 2020-12-09 DIAGNOSIS — L218 Other seborrheic dermatitis: Secondary | ICD-10-CM | POA: Diagnosis not present

## 2020-12-09 DIAGNOSIS — Z85820 Personal history of malignant melanoma of skin: Secondary | ICD-10-CM | POA: Diagnosis not present

## 2020-12-09 DIAGNOSIS — D485 Neoplasm of uncertain behavior of skin: Secondary | ICD-10-CM | POA: Diagnosis not present

## 2020-12-09 DIAGNOSIS — L718 Other rosacea: Secondary | ICD-10-CM | POA: Diagnosis not present

## 2020-12-09 DIAGNOSIS — D225 Melanocytic nevi of trunk: Secondary | ICD-10-CM | POA: Diagnosis not present

## 2020-12-09 DIAGNOSIS — C439 Malignant melanoma of skin, unspecified: Secondary | ICD-10-CM | POA: Diagnosis not present

## 2020-12-09 DIAGNOSIS — L989 Disorder of the skin and subcutaneous tissue, unspecified: Secondary | ICD-10-CM | POA: Diagnosis not present

## 2020-12-09 DIAGNOSIS — L814 Other melanin hyperpigmentation: Secondary | ICD-10-CM | POA: Diagnosis not present

## 2020-12-11 DIAGNOSIS — D485 Neoplasm of uncertain behavior of skin: Secondary | ICD-10-CM | POA: Diagnosis not present

## 2020-12-14 ENCOUNTER — Other Ambulatory Visit: Payer: Self-pay | Admitting: Adult Health

## 2020-12-14 DIAGNOSIS — G4700 Insomnia, unspecified: Secondary | ICD-10-CM

## 2020-12-16 ENCOUNTER — Telehealth: Payer: Self-pay | Admitting: Pharmacist

## 2020-12-16 NOTE — Chronic Care Management (AMB) (Signed)
    Chronic Care Management Pharmacy Assistant   Name: Tonya Bartlett  MRN: [id number] DOB: 1945-11-11 12/16/20 APPOINTMENT REMINDER   Called Patient No answer, left message of appointment on 12/17/20 at 11 via telephone visit with Jeni Salles, Pharm D.   Notified to have all medications, supplements, blood pressure and/or blood sugar logs available during appointment and to return call if need to reschedule.  Any gaps in medications fill history?  None Medications: Outpatient Encounter Medications as of 12/16/2020  Medication Sig   acetaminophen (TYLENOL) 325 MG tablet 1 tablets as needed   Aspirin 81 MG CAPS Take by mouth. 1 Tab in the morning 1 tablet at bedtime   betamethasone dipropionate 0.05 % lotion APPLY TOPICALLY TO THE AFFECTED AREA DAILY   Calcium-Magnesium-Zinc (CAL-MAG-ZINC PO) Take 1 tablet by mouth every evening.   Cholecalciferol (VITAMIN D3) 125 MCG (5000 UT) TABS Take 6,000 Units by mouth every evening.   escitalopram (LEXAPRO) 10 MG tablet TAKE 1 AND 1/2 TABLETS(15 MG) BY MOUTH DAILY   famotidine (PEPCID) 20 MG tablet Take 1 tablet (20 mg total) by mouth daily as needed for heartburn or indigestion.   FIBER PO Take 1 capsule by mouth in the morning and at bedtime.   HYDROcodone-acetaminophen (NORCO/VICODIN) 5-325 MG tablet Take 1 tablet by mouth every 6 (six) hours as needed.   hydrocortisone cream 1 % Apply 1 application topically daily as needed for itching.   L-Methylfolate-B12-B6-B2 (CEREFOLIN PO) Take 1 tablet by mouth daily.   L-Methylfolate-B12-B6-B2 (CEREFOLIN) 07-17-48-5 MG TABS TAKE 1 TABLET BY MOUTH EVERY DAY   losartan (COZAAR) 100 MG tablet Take 100 mg by mouth daily.   methotrexate (RHEUMATREX) 2.5 MG tablet Take 15 mg by mouth once a week.   sulfamethoxazole-trimethoprim (BACTRIM,SEPTRA) 400-80 MG tablet Take 1 tablet by mouth daily. For urethritis.   SUMAtriptan (IMITREX) 100 MG tablet TAKE 1 TABLET BY MOUTH AS NEEDED FOR MIGRAINE. MAY REPEAT DOSE  IN 2 HOURS IF HEADACHE PERSISTS OR RECURS   traMADol (ULTRAM) 50 MG tablet Take 50 mg by mouth every 4 (four) hours as needed.   traZODone (DESYREL) 50 MG tablet TAKE 1 TABLET(50 MG) BY MOUTH AT BEDTIME   Facility-Administered Encounter Medications as of 12/16/2020  Medication   0.9 %  sodium chloride infusion    Care Gaps: AWV - 2/23 BP- 134/70 COVID Booster #4 - Overdue Flu Vaccine - Overdue  Star Rating Drugs: Losartan 100mg  - Last filled 11/20/2020 [facility]DS at [place] Pharmacist Assistant [phone]

## 2020-12-17 ENCOUNTER — Ambulatory Visit (INDEPENDENT_AMBULATORY_CARE_PROVIDER_SITE_OTHER): Payer: Medicare Other | Admitting: Pharmacist

## 2020-12-17 DIAGNOSIS — E782 Mixed hyperlipidemia: Secondary | ICD-10-CM

## 2020-12-17 DIAGNOSIS — I10 Essential (primary) hypertension: Secondary | ICD-10-CM

## 2020-12-17 DIAGNOSIS — C50919 Malignant neoplasm of unspecified site of unspecified female breast: Secondary | ICD-10-CM

## 2020-12-17 HISTORY — DX: Malignant neoplasm of unspecified site of unspecified female breast: C50.919

## 2020-12-17 NOTE — Patient Instructions (Signed)
Hi Tonya Bartlett,  It was great to get to meet you over the telephone! Below is a summary of some of the topics we discussed.   Here are a few highlights: -Try meclizine available over the counter for vertigo -Try to maintain a routine of checking your blood pressure once a week at home and bring your cuff to your next office visit -Don't forget to ask your rheumatolgist about a repeat bone density scan  Please reach out to me if you have any questions or need anything before our follow up!  Best, Maddie  Jeni Salles, PharmD, [facility]   Visit Information   Goals Addressed   None    Patient Care Plan: CCM Pharmacy Care Plan     Problem Identified: Problem: Hypertension, Hyperlipidemia, GERD, Depression, and Insomnia, Headaches      Long-Range Goal: Patient-Specific Goal   Start Date: 12/17/2020  Expected End Date: 12/17/2021  This Visit's Progress: On track  Priority: High  Note:   Current Barriers:  Unable to independently monitor therapeutic efficacy Suboptimal therapeutic regimen for hyperlipidemia Does not adhere to prescribed medication regimen  Pharmacist Clinical Goal(s):  Patient will achieve adherence to monitoring guidelines and medication adherence to achieve therapeutic efficacy achieve control of cholesterol as evidenced by next lipid panel  through collaboration with PharmD and provider.   Interventions: 1:1 collaboration with [name], NP regarding development and update of comprehensive plan of care as evidenced by provider attestation and co-signature Inter-disciplinary care team collaboration (see longitudinal plan of care) Comprehensive medication review performed; medication list updated in electronic medical record  Hypertension (BP goal <140/90) -Not ideally controlled -Current treatment: Losartan 100 mg 1 tablet daily -Medications previously tried: lisinopril (dry cough)   -Current home readings: 142/78 (checking; arm cuff) -Current dietary habits: not paying much attention does eat popcorn - recommended lite salt -Current exercise habits: doing PT exercises -Reports hypotensive/hypertensive symptoms -Educated on BP goals and benefits of medications for prevention of heart attack, stroke and kidney damage; Importance of home blood pressure monitoring; Proper BP monitoring technique; Symptoms of hypotension and importance of maintaining adequate hydration; -Counseled to monitor BP at home weekly, document, and provide log at future appointments -Counseled on diet and exercise extensively Recommended to continue current medication  Hyperlipidemia: (LDL goal < 70 due to elevated CAC score) -Uncontrolled -Current treatment: No medications -Medications previously tried: atorvastatin (unknown)  -Current dietary patterns: not eating as well as she should -Current exercise habits: PT exercises -Educated on Cholesterol goals;  Benefits of statin for ASCVD risk reduction; -Recommended initiation of high intensity statin.  Depression (Goal: minimize symptoms) -Controlled -Current treatment: L-methylfolate-B12-B6-B2 Escitalopram 10 mg 1.5 tablets daily -Medications previously tried/failed: unknown -PHQ9: 1 -Educated on Benefits of medication for symptom control Benefits of cognitive-behavioral therapy with or without medication -Recommended to continue current medication  Insomnia (Goal: improve quality and quantity of sleep) -Controlled -Current treatment  Trazodone 50 mg 1 tablet at bedtime -Medications previously tried: none  -Recommended to continue current medication  Headaches (Goal: minimize severity of headaches) -Controlled -Current treatment  Sumatriptan 100 mg 1 tablet as needed -Medications previously tried: none  -Recommended to continue current medication  Pain (Goal: minimize pain) -Controlled -Current treatment  Tramadol 50 mg 1  tablet as needed Norco 5-325 mg 1 tablet as needed -Medications previously tried: n/a  -Recommended to continue current medication  Psoriatic arthritis (Goal: minimize symptoms) -Controlled -Current treatment  None -Medications previously tried: Remicade (high BP), [place] N[place] (  suicidal thoughts), methotrexate (possible dizziness) - Patient stopped methotrexate due to ongoing dizziness.  GERD (Goal: minimize symptoms) -Controlled -Current treatment  Famotidine 20mg , 1 tablet once daily -Medications previously tried: none  -Recommended to continue current medication  Osteopenia (Goal prevent fractures) -Controlled -Last DEXA Scan: 04/01/2018              T-Score right femoral neck: -1.0             T-Score total hip: n/a             T-Score lumbar spine: -1.9 T-Score forearm radius: n/a             10-year probability of major osteoporotic fracture: 8.8%             10-year probability of hip fracture: 1.1% -Patient is not a candidate for pharmacologic treatment -Current treatment  Calcium citrate (500mg )- magnesium-zinc- vitamin D (800 IU), 1 tablet every evening Vitamin D3 5000 units, 1 tablet every evening  -Medications previously tried: none  -Recommend [id number] units of vitamin D daily. Recommend 1200 mg of calcium daily from dietary and supplemental sources. Recommend weight-bearing and muscle strengthening exercises for building and maintaining bone density. -Recommended repeat DEXA.  Health Maintenance -Vaccine gaps: COVID booster, influenza -Current therapy:  Fiber daily Betamethasone 0.05% lotion Aspirin 81 mg daily Acetaminophen 325 mg as needed Hydrocortisone cream 1% as needed -Educated on Cost vs benefit of each product must be carefully weighed by individual consumer -Patient is satisfied with current therapy and denies issues -Recommended to continue current medication  Patient Goals/Self-Care Activities Patient will:  - take medications as prescribed as  evidenced by patient report and record review check blood pressure weekly, document, and provide at future appointments target a minimum of 150 minutes of moderate intensity exercise weekly  Follow Up Plan: Telephone follow up appointment with care management team member scheduled for: 6 months       Patient verbalizes understanding of instructions provided today and agrees to view in [place].  Telephone follow up appointment with pharmacy team member scheduled for: 6 months  Tonya Bartlett, [facility]

## 2020-12-17 NOTE — Progress Notes (Signed)
Chronic Care Management Pharmacy Note  12/17/2020 Name:  Tonya Bartlett MRN:  [id number] DOB:  10-21-1945  Summary: BP not ideally at goal < 140/90 LDL not at goal < 70  Recommendations/Changes made from today's visit: -Recommended bringing BP cuff to next office visit -Recommend initiation of high intensity statin based on elevated CAC score and LDL goal < 70 -Recommend repeat DEXA -Recommended trial of meclizine for vertigo symptoms  Plan: BP assessment in 2-3 months   Subjective: Tonya Bartlett is an 75 y.o. year old female who is a primary patient of [name], NP.  The CCM team was consulted for assistance with disease management and care coordination needs.    Engaged with patient by telephone for follow up visit in response to provider referral for pharmacy case management and/or care coordination services.   Consent to Services:  The patient was given information about Chronic Care Management services, agreed to services, and gave verbal consent prior to initiation of services.  Please see initial visit note for detailed documentation.   Patient Care Team: [name], NP as PCP - General (Family Medicine) [name], MD (Inactive) as Consulting Physician (Urology) Manson Passey, [facility] (Specialist) [name], [facility] as Pharmacist (Pharmacist)  Recent office visits: 08/23/20 [name], NP (PCP) - Patient presented for Closed trimalleolar fracture of right ankle. No medication changes.   07/23/2020 Nafziger, Tommi Rumps, NP (PCP) - Patient presented for frequent falls and hypertension. No medication changes.   04/23/2020 Nafziger, Tommi Rumps, NP (PCP) - Patient presented for Mixed hyperlipidemia and other concerns. Stopped Ferrous sulfate, Miralax and Xarelto.  Recent consult visits: 12/06/20 Leafy Kindle (rheumatology): Unable to access notes.  12/04/20 [name], MD (psychiatry): Patient presented for depression follow up. Follow up in 4 months.  11/29/20  Daleen Snook Herbst, PT (emergeortho): Patient presented for PT session for right ankle. Unable to access notes.  11/05/20 Leigh Aurora, MD (rheumatology): Unable to access notes.  10/22/20 Quay Burow, MD (cardiology): Patient presented for initial consult for palpitations and possible Afib during endoscopy.  10/17/20 Patient underwent an endoscopy.  10/01/20 Leigh Aurora, MD (rheumatology): Unable to access notes.  07/24/20 [name] MD (Behavioral hlth) - Patient presented for major depressive disorder, no other details of visit available.   07/04/20 Armbruster, [name], MD Gertie Fey.) - Patient presented for Dysphagia, unspecified type and other concerns. Famotidine changed to PRN, Losartan decreased to 10m once daily, Gabapentin discontinued.  Hospital visits: Medication Reconciliation was completed by comparing discharge summary, patient's EMR and Pharmacy list, and upon discussion with patient.   [facility]. Discharge date was 08/06/2020. [facility] -[facility]  Docusate sodium 1034mtake one capsule two times a day Methocarbamol 5006make one tablet every 8 hours PRN Ondansetron 4mg36mN Polyethylene glycol 17g take two times a day Rivaroxaban 10mg70me one tablet daily Senna 8.6mg t36m two tablets two times a day  Due to fracture   Medication Changes at Hospital Discharge: -Changed None   Medications Discontinued at Hospital Discharge: -Stopped  atorvastatin 20 MG tablet  clindamycin 300 MG capsule   Due to fracture   Medications that remain the same after Hospital Discharge:??  -All other medications will remain the same.    Objective:  Lab Results  Component Value Date   CREATININE 0.59 08/05/2020   BUN 13 08/05/2020   GFR 87.17 07/23/2020  GFRNONAA >60 08/05/2020   GFRAA >60 08/02/2019   NA  139 08/05/2020   K 3.6 08/05/2020   CALCIUM 8.9 08/05/2020   CO2 28 08/05/2020   GLUCOSE 110 (H) 08/05/2020    Lab Results  Component Value Date/Time   HGBA1C 5.3 11/05/2016 09:24 AM   GFR 87.17 07/23/2020 04:56 PM   GFR 88.03 04/23/2020 03:15 PM    Last diabetic Eye exam: No results found for: HMDIABEYEEXA  Last diabetic Foot exam: No results found for: HMDIABFOOTEX   Lab Results  Component Value Date   CHOL 177 04/23/2020   HDL 69.40 04/23/2020   LDLCALC 92 04/23/2020   LDLDIRECT 187.9 10/08/2009   TRIG 76.0 04/23/2020   CHOLHDL 3 04/23/2020    Hepatic Function Latest Ref Rng & Units 08/01/2020 07/31/2020 04/23/2020  Total Protein 6.5 - 8.1 g/dL 6.7 6.8 7.1  Albumin 3.5 - 5.0 g/dL 4.0 3.8 4.3  AST 15 - 41 U/L _0 ALT 0 - 44 U/L _1 Alk Phosphatase 38 - 126 U/L 61 60 69  Total Bilirubin 0.3 - 1.2 mg/dL 0.8 0.6 1.0  Bilirubin, Direct 0.0 - 0.3 mg/dL - - -    Lab Results  Component Value Date/Time   TSH 1.68 04/23/2020 03:15 PM   TSH 2.26 02/03/2019 11:08 AM    CBC Latest Ref Rng & Units 08/05/2020 08/04/2020 08/01/2020  WBC 4.0 - 10.5 K/uL 9.8 6.1 9.6  Hemoglobin 12.0 - 15.0 g/dL 11.6(L) 11.2(L) 11.9(L)  Hematocrit 36.0 - 46.0 % 34.7(L) 33.9(L) 35.4(L)  Platelets 150 - 400 K/uL 267 245 240    No results found for: VD25OH  Clinical ASCVD: No  The 10-year ASCVD risk score (Arnett DK, et al., 2019) is: 25.4%   Values used to calculate the score:     Age: 25 years     Sex: Female     Is Non-Hispanic African American: No     Diabetic: No     Tobacco smoker: No     Systolic Blood Pressure: 224 mmHg     Is BP treated: Yes     HDL Cholesterol: 69.4 mg/dL     Total Cholesterol: 177 mg/dL    Depression screen [facility] 2/9 04/10/2020 12/28/2017 11/23/2017  Decreased Interest 0 3 1  Down, Depressed, Hopeless _2 PHQ - 2 Score _3 Altered sleeping - 2 -  Tired, decreased energy - 3 -  Change in appetite - 3 -  Feeling bad or failure about yourself  - 2 -   Trouble concentrating - 2 -  Moving slowly or fidgety/restless - 1 -  Suicidal thoughts - 0 -  PHQ-9 Score - 18 -  Some recent data might be hidden     Social History   Tobacco Use  Smoking Status Former   Packs/day: 0.50   Years: 15.00   Pack years: 7.50   Types: Cigarettes   Quit date: 05/15/1985   Years since quitting: 35.6  Smokeless Tobacco Never   BP Readings from Last 3 Encounters:  10/22/20 134/70  10/17/20 (!) 149/79  08/06/20 (!) 158/80   Pulse Readings from Last 3 Encounters:  10/22/20 76  10/17/20 (!) 54  08/23/20 62   Wt Readings from Last 3 Encounters:  10/22/20 220 lb (99.8 kg)  10/17/20 219 lb (99.3 kg)  08/23/20 220 lb (99.8 kg)   BMI Readings from Last 3 Encounters:  10/22/20 36.61 kg/m  10/17/20 36.44 kg/m  08/23/20  36.61 kg/m    Assessment/Interventions: Review of patient past medical history, allergies, medications, health status, including review of consultants reports, laboratory and other test data, was performed as part of comprehensive evaluation and provision of chronic care management services.   SDOH:  (Social Determinants of Health) assessments and interventions performed: No  SDOH Screenings   Alcohol Screen: Not on file  Depression (PHQ2-9): Low Risk    PHQ-2 Score: 1  Financial Resource Strain: Low Risk    Difficulty of Paying Living Expenses: Not hard at all  Food Insecurity: No Food Insecurity   Worried About Charity fundraiser in the Last Year: Never true   Ran Out of Food in the Last Year: Never true  Housing: Low Risk    Last Housing Risk Score: 0  Physical Activity: Insufficiently Active   Days of Exercise per Week: 7 days   Minutes of Exercise per Session: 20 min  Social Connections: Moderately Isolated   Frequency of Communication with Friends and Family: More than three times a week   Frequency of Social Gatherings with Friends and Family: More than three times a week   Attends Religious Services: Never    Marine scientist or Organizations: Yes   Attends Archivist Meetings: 1 to 4 times per year   Marital Status: Widowed  Stress: No Stress Concern Present   Feeling of Stress : Only a little  Tobacco Use: Medium Risk   Smoking Tobacco Use: Former   Smokeless Tobacco Use: Never   Passive Exposure: Not on file  Transportation Needs: No Transportation Needs   Lack of Transportation (Medical): No   Lack of Transportation (Non-Medical): No    CCM Care Plan  Allergies  Allergen Reactions   Otezla [Apremilast] Anaphylaxis    Suicidal ideation   Penicillins Anaphylaxis    Has patient had a PCN reaction causing immediate rash, facial/tongue/throat swelling, SOB or lightheadedness with hypotension: Yes Has patient had a PCN reaction causing severe rash involving mucus membranes or skin necrosis: Yes Has patient had a PCN reaction that required hospitalization: No Has patient had a PCN reaction occurring within the last 10 year No If all of the above answers are "NO", then may proceed with Cephalosporin use.    Erythromycin Other (See Comments)    SEVERE STOMACH CRAMPS   Morphine And Related Nausea And Vomiting   Nsaids Other (See Comments)    SEVERE STOMACH CRAMPS, MOUTH SORES **Able to tolerate Tylenol   Remicade [Infliximab] Other (See Comments)    Shut down immune system-BP high   Tolmetin     SEVERE STOMACH CRAMPS   Nickel Rash    Including snaps on hospital gowns     Medications Reviewed Today     Reviewed by [name], MD (Psychiatrist) on 12/04/20 at 1003  Med List Status: <None>   Medication Order Taking? Sig Documenting Provider Last Dose Status Informant  0.9 %  sodium chloride infusion [id number]   Armbruster, [name], MD  Active   acetaminophen (TYLENOL) 325 MG tablet [id number] No 1 tablets as needed [provider] Taking Active   Aspirin 81 MG CAPS [id number] No Take by mouth. 1 Tab in the morning 1 tablet at bedtime [provider] Taking Active   betamethasone dipropionate 0.05 % lotion [id number]  APPLY TOPICALLY TO THE AFFECTED AREA DAILY Nafziger, Tommi Rumps, NP  Active   Calcium-Magnesium-Zinc (CAL-MAG-ZINC PO) [id number] No Take 1 tablet by mouth every evening. [provider] Taking Active  Self  Cholecalciferol (VITAMIN D3) 125 MCG (5000 UT) TABS [id number] No Take 6,000 Units by mouth every evening. [provider] Taking Active Self  escitalopram (LEXAPRO) 10 MG tablet [id number]  TAKE 1 AND 1/2 TABLETS(15 MG) BY MOUTH DAILY [name], MD  Active   famotidine (PEPCID) 20 MG tablet [id number] No Take 1 tablet (20 mg total) by mouth daily as needed for heartburn or indigestion. Yetta Flock, MD Taking Active Self  FIBER PO [id number] No Take 1 capsule by mouth in the morning and at bedtime. [provider] Taking Active Self  HYDROcodone-acetaminophen (NORCO/VICODIN) 5-325 MG tablet [id number] No Take 1 tablet by mouth every 6 (six) hours as needed. [provider] Taking Active   hydrocortisone cream 1 % [id number] No Apply 1 application topically daily as needed for itching. [provider] Taking Active Self  L-Methylfolate-B12-B6-B2 (CEREFOLIN PO) [id number] No Take 1 tablet by mouth daily. [provider] Taking Active Self  losartan (COZAAR) 100 MG tablet [id number] No Take 100 mg by mouth daily. [provider] Taking Active   methotrexate (RHEUMATREX) 2.5 MG tablet [id number] No Take 15 mg by mouth once a week. [provider] Taking Active Self  sulfamethoxazole-trimethoprim (BACTRIM,SEPTRA) 400-80 MG tablet [id number] No Take 1 tablet by mouth daily. For urethritis. [name], MD Taking Active Self  SUMAtriptan (IMITREX) 100 MG tablet [id number] No TAKE 1 TABLET BY MOUTH AS NEEDED FOR MIGRAINE. MAY REPEAT DOSE IN 2 HOURS IF HEADACHE PERSISTS OR RECURS Nafziger, Tommi Rumps, NP Taking Active Self  traMADol (ULTRAM) 50 MG tablet [id number]  No Take 50 mg by mouth every 4 (four) hours as needed. 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   MCI (mild cognitive impairment) 09/19/2015   Memory deficit 09/19/2015   SIRS (systemic inflammatory response syndrome) ([place]) 10/29/2014   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   Depression 11/16/2013   ADD (attention deficit disorder) 11/17/2011   Essential hypertension, benign 10/15/2009   SEBORRHEIC KERATOSIS, INFLAMED 10/17/2008   CARPAL TUNNEL SYNDROME, LEFT 06/22/2008   Allergic rhinitis 08/30/2007   Hyperlipidemia 11/11/2006   Migraine 11/11/2006   Osteoarthritis 11/11/2006    Immunization History  Administered Date(s) Administered   Fluad Quad(high Dose 65+) 10/31/2018   Influenza Split 11/17/2011   Influenza Whole 10/15/2009   Influenza, High Dose Seasonal PF 01/02/2014, 11/06/2014, 11/15/2015, 11/05/2016, 12/02/2017   Influenza-Unspecified 10/17/2012   PFIZER(Purple Top)SARS-COV-2 Vaccination 03/25/2019, 04/18/2019, 11/03/2019   Pneumococcal Conjugate-13 01/02/2014   Pneumococcal Polysaccharide-23 10/15/2009   Td 08/24/2006   Tdap 07/31/2020   Zoster Recombinat (Shingrix) 12/07/2018, 02/15/2019   Zoster, Live 02/23/2015   Patient is having lots of dizziness and she broke her ankle a few months ago. Patient is still unable to figure out what is going on with her with dizziness and this was the 10th fall within a few months.  Patient plans to go back on weight  watchers and her weight is up to  220 lbs.  Conditions to be addressed/monitored:  Hypertension, Hyperlipidemia, GERD, Depression, Osteopenia, and Insomnia, Headaches  Conditions addressed this visit: Hypertension, hyperlipidemia, osteopenia  Care Plan : CCM Pharmacy Care Plan  Updates made by [name], [place] since 12/17/2020 12:00 AM     Problem: Problem: Hypertension, Hyperlipidemia, GERD, Depression, and Insomnia, Headaches      Long-Range Goal: Patient-Specific Goal   Start Date: 12/17/2020  Expected End Date: 12/17/2021  This Visit's Progress: On track  Priority: High  Note:   Current Barriers:  Unable to independently monitor therapeutic efficacy Suboptimal therapeutic regimen for hyperlipidemia Does not adhere to prescribed medication regimen  Pharmacist Clinical Goal(s):  Patient will achieve adherence to monitoring guidelines and medication adherence to achieve therapeutic efficacy achieve control of cholesterol as evidenced by next lipid panel  through collaboration with PharmD and provider.   Interventions: 1:1 collaboration with [name], NP regarding development and update of comprehensive plan of care as evidenced by provider attestation and co-signature Inter-disciplinary care team collaboration (see longitudinal plan of care) Comprehensive medication review performed; medication list updated in electronic medical record  Hypertension (BP goal <140/90) -Not ideally controlled -Current treatment: Losartan 100 mg 1 tablet daily -Medications previously tried: lisinopril (dry cough)  -Current home readings: 142/78 (checking; arm cuff) -Current dietary habits: not paying much attention does eat popcorn - recommended lite salt -Current exercise habits: doing PT exercises -Reports hypotensive/hypertensive symptoms -Educated on BP goals and benefits of medications for prevention of heart attack, stroke and kidney damage; Importance of home blood pressure  monitoring; Proper BP monitoring technique; Symptoms of hypotension and importance of maintaining adequate hydration; -Counseled to monitor BP at home weekly, document, and provide log at future appointments -Counseled on diet and exercise extensively Recommended to continue current medication  Hyperlipidemia: (LDL goal < 70 due to elevated CAC score) -Uncontrolled -Current treatment: No medications -Medications previously tried: atorvastatin (unknown)  -Current dietary patterns: not eating as well as she should -Current exercise habits: PT exercises -Educated on Cholesterol goals;  Benefits of statin for ASCVD risk reduction; -Recommended initiation of high intensity statin.  Depression (Goal: minimize symptoms) -Controlled -Current treatment: L-methylfolate-B12-B6-B2 Escitalopram 10 mg 1.5 tablets daily -Medications previously tried/failed: unknown -PHQ9: 1 -Educated on Benefits of medication for symptom control Benefits of cognitive-behavioral therapy with or without medication -Recommended to continue current medication  Insomnia (Goal: improve quality and quantity of sleep) -Controlled -Current treatment  Trazodone 50 mg 1 tablet at bedtime -Medications previously tried: none  -Recommended to continue current medication  Headaches (Goal: minimize severity of headaches) -Controlled -Current treatment  Sumatriptan 100 mg 1 tablet as needed -Medications previously tried: none  -Recommended to continue current medication  Pain (Goal: minimize pain) -Controlled -Current treatment  Tramadol 50 mg 1 tablet as needed Norco 5-325 mg 1 tablet as needed -Medications previously tried: n/a  -Recommended to continue current medication  Psoriatic arthritis (Goal: minimize symptoms) -Controlled -Current treatment  None -Medications previously tried: Remicade (high BP), Otezla (suicidal thoughts), methotrexate (possible dizziness) - Patient stopped methotrexate due to  ongoing dizziness.  GERD (Goal: minimize symptoms) -Controlled -Current treatment  Famotidine 39m, 1 tablet once daily -Medications previously tried: none  -Recommended to continue current medication  Osteopenia (Goal prevent fractures) -Controlled -Last DEXA Scan: 04/01/2018              T-Score right femoral neck: -1.0             T-Score total hip: n/a  T-Score lumbar spine: -1.9 T-Score forearm radius: n/a             10-year probability of major osteoporotic fracture: 8.8%             10-year probability of hip fracture: 1.1% -Patient is not a candidate for pharmacologic treatment -Current treatment  Calcium citrate (563m)- magnesium-zinc- vitamin D (800 IU), 1 tablet every evening Vitamin D3 5000 units, 1 tablet every evening  -Medications previously tried: none  -Recommend [phone] units of vitamin D daily. Recommend 1200 mg of calcium daily from dietary and supplemental sources. Recommend weight-bearing and muscle strengthening exercises for building and maintaining bone density. -Recommended repeat DEXA.  Health Maintenance -Vaccine gaps: COVID booster, influenza -Current therapy:  Fiber daily Betamethasone 0.05% lotion Aspirin 81 mg daily Acetaminophen 325 mg as needed Hydrocortisone cream 1% as needed -Educated on Cost vs benefit of each product must be carefully weighed by individual consumer -Patient is satisfied with current therapy and denies issues -Recommended to continue current medication  Patient Goals/Self-Care Activities Patient will:  - take medications as prescribed as evidenced by patient report and record review check blood pressure weekly, document, and provide at future appointments target a minimum of 150 minutes of moderate intensity exercise weekly  Follow Up Plan: Telephone follow up appointment with care management team member scheduled for: 6 months       Medication Assistance: None required.  Patient affirms current  coverage meets needs.  Compliance/Adherence/Medication fill history: Care Gaps: AWV - 2/23 BP- 134/70 COVID Booster #4 - Overdue Flu Vaccine - Overdue  Star-Rating Drugs: Losartan 10110m- [facility]  [address]: [phone]: [phone]hone: [phone]: [phone] pill box? Yes - weekly Pt endorses 95% compliance - sometimes she forgets to call in refills  We discussed: Current pharmacy is preferred with insurance plan and patient is satisfied with pharmacy services Patient decided to: Continue current medication management strategy  Care Plan and Follow Up Patient Decision:  Patient agrees to Care Plan and Follow-up.  Plan: Telephone follow up appointment with care management team member scheduled for:  6 months  MaJeni SallesPharmD, BCMerritt Islandt [phone]

## 2020-12-19 ENCOUNTER — Ambulatory Visit (INDEPENDENT_AMBULATORY_CARE_PROVIDER_SITE_OTHER): Payer: Medicare Other | Admitting: Neurology

## 2020-12-19 VITALS — BP 191/85 | HR 69 | Ht 64.0 in | Wt 220.0 lb

## 2020-12-19 DIAGNOSIS — R2689 Other abnormalities of gait and mobility: Secondary | ICD-10-CM | POA: Diagnosis not present

## 2020-12-19 DIAGNOSIS — G3184 Mild cognitive impairment, so stated: Secondary | ICD-10-CM

## 2020-12-19 DIAGNOSIS — R42 Dizziness and giddiness: Secondary | ICD-10-CM

## 2020-12-19 NOTE — Patient Instructions (Signed)
I had a long discussion with the patient regarding her mild cognitive impairment which appears to be quite stable.  She was encouraged to increase participation in cognitively challenging activities like solving crossword puzzles, playing bridge and sodoku.  She will continue Cerefolin NAC for memory.  We also discussed results of her recent MRI scan of the brain and advised her to follow-up with ENT for her hearing loss and tinnitus as well as with cardiology for her paroxysmal A. fib treatment.  She was advised to get up slowly and avoid sudden movements.  We also discussed fall prevention precautions.  She will return for follow-up in the future only as needed and no routine schedule appointment was made. Fall Prevention in the Home, Adult Falls can cause injuries and can happen to people of all ages. There are many things you can do to make your home safe and to help prevent falls. Ask for help when making these changes. What actions can I take to prevent falls? General Instructions Use good lighting in all rooms. Replace any light bulbs that burn out. Turn on the lights in dark areas. Use night-lights. Keep items that you use often in easy-to-reach places. Lower the shelves around your home if needed. Set up your furniture so you have a clear path. Avoid moving your furniture around. Do not have throw rugs or other things on the floor that can make you trip. Avoid walking on wet floors. If any of your floors are uneven, fix them. Add color or contrast paint or tape to clearly mark and help you see: Grab bars or handrails. First and last steps of staircases. Where the edge of each step is. If you use a stepladder: Make sure that it is fully opened. Do not climb a closed stepladder. Make sure the sides of the stepladder are locked in place. Ask someone to hold the stepladder while you use it. Know where your pets are when moving through your home. What can I do in the bathroom?   Keep the  floor dry. Clean up any water on the floor right away. Remove soap buildup in the tub or shower. Use nonskid mats or decals on the floor of the tub or shower. Attach bath mats securely with double-sided, nonslip rug tape. If you need to sit down in the shower, use a plastic, nonslip stool. Install grab bars by the toilet and in the tub and shower. Do not use towel bars as grab bars. What can I do in the bedroom? Make sure that you have a light by your bed that is easy to reach. Do not use any sheets or blankets for your bed that hang to the floor. Have a firm chair with side arms that you can use for support when you get dressed. What can I do in the kitchen? Clean up any spills right away. If you need to reach something above you, use a step stool with a grab bar. Keep electrical cords out of the way. Do not use floor polish or wax that makes floors slippery. What can I do with my stairs? Do not leave any items on the stairs. Make sure that you have a light switch at the top and the bottom of the stairs. Make sure that there are handrails on both sides of the stairs. Fix handrails that are broken or loose. Install nonslip stair treads on all your stairs. Avoid having throw rugs at the top or bottom of the stairs. Choose a carpet that  does not hide the edge of the steps on the stairs. Check carpeting to make sure that it is firmly attached to the stairs. Fix carpet that is loose or worn. What can I do on the outside of my home? Use bright outdoor lighting. Fix the edges of walkways and driveways and fix any cracks. Remove anything that might make you trip as you walk through a door, such as a raised step or threshold. Trim any bushes or trees on paths to your home. Check to see if handrails are loose or broken and that both sides of all steps have handrails. Install guardrails along the edges of any raised decks and porches. Clear paths of anything that can make you trip, such as tools  or rocks. Have leaves, snow, or ice cleared regularly. Use sand or salt on paths during winter. Clean up any spills in your garage right away. This includes grease or oil spills. What other actions can I take? Wear shoes that: Have a low heel. Do not wear high heels. Have rubber bottoms. Feel good on your feet and fit well. Are closed at the toe. Do not wear open-toe sandals. Use tools that help you move around if needed. These include: Canes. Walkers. Scooters. Crutches. Review your medicines with your doctor. Some medicines can make you feel dizzy. This can increase your chance of falling. Ask your doctor what else you can do to help prevent falls. Where to find more information Centers for Disease Control and Prevention, [employer]: http://www.wolf.info/ National Institute on Aging: http://kim-miller.com/ Contact a doctor if: You are afraid of falling at home. You feel weak, drowsy, or dizzy at home. You fall at home. Summary There are many simple things that you can do to make your home safe and to help prevent falls. Ways to make your home safe include removing things that can make you trip and installing grab bars in the bathroom. Ask for help when making these changes in your home. This information is not intended to replace advice given to you by your health care provider. Make sure you discuss any questions you have with your health care provider. Document Revised: 09/06/2019 Document Reviewed: 09/06/2019 Elsevier Patient Education  [place].

## 2020-12-19 NOTE — Progress Notes (Signed)
[facility] [address] [place]. [place] [address] [phone]       OFFICE FOLLOW UP VISIT NOTE  [name] Date of Birth:  05/25/1945 Medical Record Number:  [id number]   Referring MD:  [name], NP  Reason for Referral:  Memory loss HPI: Initial Consult 09/19/15 :  [name] is a 27 year Caucasian lady who for the last year and a half has been having mild memory and cognitive difficulties. She often misplaces objects but can remember later. She is also having word finding issues and trouble getting her words out. At times she has had trouble driving to places that she has driven down  To numerous times before and yet for a few seconds cannot remember where it done. She is also forgetting names of the patient's. 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 There is no history of strokes seizures significant head injury with loss of consciousness. There is no family history of Alzheimer's dementia. Patient has not had any brain imaging studies done on November done for reversible causes of cognitive impairment. She denies feeling depressed. Past medical history significant for irritable bowel syndrome and syncopal episodes. Update 12/09/2015 : She returns for follow-up after last visit 2 and half months ago. She continues from mild short-term memory difficulties but these appear to be stable and not getting worse. Patient did not fill the prescription for self: She forgot. She had MRI scan the brain done on 10/09/2015 which I personally reviewed and shows only mild changes of chronic microvascular ischemia without any structural lesions tumor infarcts. Lab work done on 8/ 3/17 showed normal vitamin B12, TSH and RPR. EEG done on 10/24/15 was normal. Patient was interested in participating in the [facility] early  dementia study but she does not have a reliable caregiver to help her participate. She states he is taking Celexa and her depression is well controlled. She has no new complaints today. Update 06/08/2016 : She returns for followup after last visit 6 months ago. She states she continues to have mi and cognitive difficulties which are however unchanged. She has trouble finding words occasionally and completing sentences. She has been taking Cerefolin nac and find it helps.  She does play solitaire but does not participate in any other cognitively challenging activities and states her job is quite taxing and stressful. She has no neurological complaints today.Marland Kitchen Update 08/30/2017 : She returns for follow-up after last visit more than a year ago. She continues to do well and states she has not had any worsening of her memory or cognitive difficulties. She continues to have short-term memory difficulties. 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 She also play solitaire a lot. 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 She is recovered from that and is now doing a lot of swimming to help her. She feels her depression has gotten worse and despite being on Wellbutrin. She plans to discuss with primary physician referral to a psychiatrist. Denies any other neurological complaints. She remains on; Cerefolin NAC which is tolerating well. Mini-Mental status exam score today was 30/30. Update 09/01/2018 : She returns for follow-up after last visit a year ago.  She states she is doing well.  She feels her memory difficulties in fact may be slightly better.  She remains on Cerefolin NAC which she takes regularly and needs a refill.  She still gets confused easily.  She often misses appointment.  In fact today she called stating that she would not be able to do  her video visit due to technical difficulties and was switched to in person visit.  Her son now lives with her and she enjoys having him.  Her daughter also spends a lot of time with her and comes over frequently.  She does play bridge.  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  Her blood pressures well controlled and today it is 122/66.  She is still bothered by bad hip and is planning to get a steroid injection in the next week. Update 01/18/2019 ; she returns for follow-up after last visit for a number of months ago.  She states she discontinued Cerefolin as she felt the cost was too much.  She feels it may have been helping her and short-term memory may have gotten worse.  She now wants to restart Cerefolin and is asking for a prescription.  She is mostly retired but does do some work mostly from home over the phone.  She continues to have mild short-term memory and cognitive difficulties but these do not appear to be progressing.  She has new complaint today of restless right foot.  She has a constant urge to move it and wiggles her foot constantly when she is sitting and not moving.  She is able to sleep well at night but does take trazodone which helps her sleep. Update 09/26/2019: She returns for follow-up after last visit 8 months ago.  She missed an appointment last month.  She states she continues to have mild short-term memory difficulties as well as occasional word finding difficulties but is able to remember them later.  This is unchanged now for the last couple of years.  She is recently retired from a job.  Patient was taking Cerefolin in the past but try to discontinue it as it is too expensive but noticed that her symptoms are getting worse so she has recently restarted taking it.  She continues to have involuntary movement in the right leg and she was unable to tolerate gabapentin 100 mg 3 times daily and is only on twice daily now but feels she can tolerate  better but it is not helping as much.  She underwent right hip replacement 2 months ago and is able to ambulate with a cane but can walk short distances without it.  On Mini-Mental status exam testing today she scored 29/30 and was able to name 15 animals which can walk on 4 legs.  This is unchanged from last visit Update 09/18/2020: She returns for follow-up after last visit a year ago.  Patient states her memory difficulties and cognitive issues are more or less unchanged.  She in fact did quite well on the Mini-Mental status exam today she scored 30/30.  She continues to play solitaire and also reads books.  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  She denies any symptoms of stroke or TIA.  She does feel her blood pressure has been quite elevated recently with systolics in the [address].  She is working with her primary care physician to get it under better control.  She has had several health  issues and had a few falls and fractured her ankle more than once requiring multiple surgeries.  She recently saw Dr. Benjamine Mola ENT physician for her dizziness but I do not have his records to review.  Patient had seen Dr. Melissa Montane, ENT in 2017 for decreased hearing on the left and at that time had reported bilateral tinnitus as well.  Dr. Benjamine Mola ordered an MRI scan of the brain which was done on 10/07/2020 with thin sections through the internal auditory canal and showed no evidence of tumor or acute abnormality.  There are mild changes of chronic small vessel disease noted.  She also had an endoscopy for esophageal dilatation and during postprocedure developed paroxysmal A. fib.  She has seen Dr. Quay Burow cardiologist who plans to see her back to discuss anticoagulation options options as he did outpatient Holter monitoring which also showed several months of paroxysmal A. fib. ROS:   14 system review of systems is positive for  restless legs, right leg involuntary movement.,   Right hip pain.  Memory loss, word finding difficulty walking difficulty, ankle pain and fracture, dizziness, imbalance, falls and all other systems negative  PMH:  Past Medical History:  Diagnosis Date   Allergic rhinitis    Allergy    Anemia    Anxiety    Blood transfusion without reported diagnosis    Cataract    bil cataracts removed   Clotting disorder ([place])    DVT- 1996 po knee replacement   Colitis    Complication of anesthesia    vomit x1 while on Morphine per pt   Depression    Diverticulosis    Frequency of urination    GERD (gastroesophageal reflux disease)    History of colon polyps    History of DVT of lower extremity    POST LEFT TOTAL KNEE  1996   History of hiatal hernia    History of iron deficiency anemia 1996   iron infusion   History of migraine headaches    History of rib fracture    Hyperlipidemia    IBS (irritable bowel syndrome)    Insomnia    MCI (mild cognitive impairment)    Melanoma ([place]) 2019   left leg    Migraine headache    hx of migraines    OA (osteoarthritis)    RIGHT SHOULDER   Osteopenia    Pneumonia    remote history   PONV (postoperative nausea and vomiting)    Recovering alcoholic ([place])    SINCE [id number]   Right rotator cuff tear    Substance abuse ([place])    recovering alcoholic   Unspecified essential hypertension     Social History:  Social History   Socioeconomic History   Marital status: Widowed    Spouse name: Not on file   Number of children: Not on file   Years of education: Not on file   Highest education level: Master's degree (e.g., MA, MS, MEng, MEd, MSW, MBA)  Occupational History   Not on file  Tobacco Use   Smoking status: Former    Packs/day: 0.50    Years: 15.00    Pack years: 7.50    Types: Cigarettes    Quit date: 05/15/1985    Years since quitting: 35.6   Smokeless tobacco: Never  Vaping Use   Vaping Use: Never used  Substance and Sexual Activity   Alcohol use: No    Comment:  RECOVERING ALCOHOLIC--  QUIT [id number]   Drug use: No  Sexual activity: Never    Birth control/protection: Post-menopausal  Other Topics Concern   Not on file  Social History Narrative   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    Widowed    Three kids    2 grandchildren       Social Determinants of Radio broadcast assistant Strain: Low Risk    Difficulty of Paying Living Expenses: Not hard at all  Food Insecurity: No Food Insecurity   Worried About Charity fundraiser in the Last Year: Never true   Arboriculturist in the Last Year: Never true  Transportation Needs: No Transportation Needs   Lack of Transportation (Medical): No   Lack of Transportation (Non-Medical): No  Physical Activity: Insufficiently Active   Days of Exercise per Week: 7 days   Minutes of Exercise per Session: 20 min  Stress: No Stress Concern Present   Feeling of Stress : Only a little  Social Connections: Moderately Isolated   Frequency of Communication with Friends and Family: More than three times a week   Frequency of Social Gatherings with Friends and Family: More than three times a week   Attends Religious Services: Never   Marine scientist or Organizations: Yes   Attends Archivist Meetings: 1 to 4 times per year   Marital Status: Widowed  Human resources officer Violence: Not At Risk   Fear of Current or Ex-Partner: No   Emotionally Abused: No   Physically Abused: No   Sexually Abused: No    Medications:   Current Outpatient Medications on File Prior to Visit  Medication Sig Dispense Refill   acetaminophen (TYLENOL) 325 MG tablet 1 tablets as needed     Aspirin 81 MG CAPS Take by mouth. 1 Tab in the morning 1 tablet at bedtime     betamethasone dipropionate 0.05 % lotion APPLY TOPICALLY TO THE AFFECTED AREA DAILY 60 mL 2   Calcium-Magnesium-Zinc (CAL-MAG-ZINC PO) Take 1 tablet by mouth every evening.     Cholecalciferol (VITAMIN D3) 125  MCG (5000 UT) TABS Take 6,000 Units by mouth every evening.     escitalopram (LEXAPRO) 10 MG tablet TAKE 1 AND 1/2 TABLETS(15 MG) BY MOUTH DAILY 135 tablet 0   famotidine (PEPCID) 20 MG tablet Take 1 tablet (20 mg total) by mouth daily as needed for heartburn or indigestion. 90 tablet 3   FIBER PO Take 1 capsule by mouth in the morning and at bedtime.     HYDROcodone-acetaminophen (NORCO/VICODIN) 5-325 MG tablet Take 1 tablet by mouth every 6 (six) hours as needed.     hydrocortisone cream 1 % Apply 1 application topically daily as needed for itching.     losartan (COZAAR) 100 MG tablet Take 100 mg by mouth daily.     sulfamethoxazole-trimethoprim (BACTRIM,SEPTRA) 400-80 MG tablet Take 1 tablet by mouth daily. For urethritis.     SUMAtriptan (IMITREX) 100 MG tablet TAKE 1 TABLET BY MOUTH AS NEEDED FOR MIGRAINE. MAY REPEAT DOSE IN 2 HOURS IF HEADACHE PERSISTS OR RECURS 10 tablet 0   traZODone (DESYREL) 50 MG tablet TAKE 1 TABLET(50 MG) BY MOUTH AT BEDTIME 90 tablet 1   Current Facility-Administered Medications on File Prior to Visit  Medication Dose Route Frequency Provider Last Rate Last Admin   0.9 %  sodium chloride infusion  500 mL Intravenous Once Armbruster, [name], MD        Allergies:   Allergies  Allergen Reactions  [place] Nail [Apremilast] Anaphylaxis    Suicidal ideation   Penicillins Anaphylaxis    Has patient had a PCN reaction causing immediate rash, facial/tongue/throat swelling, SOB or lightheadedness with hypotension: Yes Has patient had a PCN reaction causing severe rash involving mucus membranes or skin necrosis: Yes Has patient had a PCN reaction that required hospitalization: No Has patient had a PCN reaction occurring within the last 10 year No If all of the above answers are "NO", then may proceed with Cephalosporin use.    Erythromycin Other (See Comments)    SEVERE STOMACH CRAMPS   Morphine And Related Nausea And Vomiting   Nsaids Other (See Comments)    SEVERE  STOMACH CRAMPS, MOUTH SORES **Able to tolerate Tylenol   Remicade [Infliximab] Other (See Comments)    Shut down immune system-BP high   Tolmetin     SEVERE STOMACH CRAMPS   Nickel Rash    Including snaps on hospital gowns     Physical Exam General: well developed, well nourished elderly Caucasian lady, seated, in no evident distress Head: head normocephalic and atraumatic.   Neck: supple with no carotid or supraclavicular bruits Cardiovascular: regular rate and rhythm, no murmurs Musculoskeletal: no deformity right ankle surgical scar from surgery Skin:  no rash/petichiae Vascular:  Normal pulses all extremities  Neurologic Exam Mental Status: Awake and fully alert. Oriented to place and time. Recent and remote memory intact. Attention span, concentration and fund of knowledge appropriate. Mood and affect appropriate. Mini-Mental status exam 230/30 with no deficits..  Recall 3/3.. . Able to name 15 four legged animals. Clock drawing 4/4. Able to copy intersecting pentagons quite well. Cranial Nerves: Fundoscopic exam not done. Pupils equal, briskly reactive to light. Extraocular movements full without nystagmus. Visual fields full to confrontation. Hearing intact. Facial sensation intact. Face, tongue, palate moves normally and symmetrically.  Motor: Normal bulk and tone. Normal strength in all tested extremity muscles.  Intermittent involuntary right leg leg involuntary movements with twisting and turning of the right leg and toes. Sensory.: intact to touch , pinprick , position and vibratory sensation.  Coordination: Rapid alternating movements normal in all extremities. Finger-to-nose and heel-to-shin performed accurately bilaterally. Gait and Station: Arises from chair without difficulty. Stance is normal. Gait demonstrates slight favoring of the right ankle due to pain.  Unable to tandem walk without difficulty unsteady while standing on either lower leg unsupported. reflexes: 1+ and  symmetric. Toes downgoing.       ASSESSMENT: 27 year Caucasian lady with mild memory and cognitive difficulties likely due to mild cognitive impairment which appears stable..  Dizziness and gait imbalance likely multifactorial due to combination of vestibular dysfunction, age-related white matter changes in the brain and degenerative arthritis and ankle injury and fracture.       PLAN: I had a long discussion with the patient regarding her mild cognitive impairment which appears to be quite stable.  She was encouraged to increase participation in cognitively challenging activities like solving crossword puzzles, playing bridge and sodoku.  She will continue Cerefolin NAC for memory.  We also discussed results of her recent MRI scan of the brain and advised her to follow-up with ENT for her hearing loss and tinnitus as well as with cardiology for her paroxysmal A. fib treatment.  She was advised to get up slowly and avoid sudden movements.  We also discussed fall prevention precautions.  She will return for follow-up in the future only as needed and no routine schedule appointment was made. .. Greater  than 50% time during this 30 minute  visit was spent on counseling and coordination of care about her memory loss and dizziness and gait imbalance. and answering questions Antony Contras, MD  [facility] [address] [place], [place] [address]  Phone [phone] Fax [phone] Note: This document was prepared with digital dictation and possible smart phrase technology. Any transcriptional errors that result from this process are unintentional.

## 2020-12-20 ENCOUNTER — Other Ambulatory Visit: Payer: Self-pay

## 2020-12-20 ENCOUNTER — Ambulatory Visit: Payer: Medicare Other | Attending: Otolaryngology

## 2020-12-20 DIAGNOSIS — R296 Repeated falls: Secondary | ICD-10-CM | POA: Diagnosis not present

## 2020-12-20 DIAGNOSIS — R2681 Unsteadiness on feet: Secondary | ICD-10-CM | POA: Diagnosis not present

## 2020-12-20 DIAGNOSIS — R42 Dizziness and giddiness: Secondary | ICD-10-CM | POA: Insufficient documentation

## 2020-12-20 DIAGNOSIS — H8112 Benign paroxysmal vertigo, left ear: Secondary | ICD-10-CM | POA: Diagnosis not present

## 2020-12-20 NOTE — Therapy (Signed)
[facility] [address] [place], [place], [address] Phone: [phone]   Fax:  [phone]  Physical Therapy Evaluation  Patient Details  Name: Tonya Bartlett MRN: [id number] Date of Birth: [birth year] Referring Provider (PT): Leta Baptist, MD   Encounter Date: 12/20/2020   PT End of Session - 12/20/20 1448     Visit Number 1    Number of Visits 9    Date for PT Re-Evaluation 02/14/21    Authorization Type Medicare    PT Start Time 1447    PT Stop Time 1527    PT Time Calculation (min) 40 min    Activity Tolerance Patient tolerated treatment well    Behavior During Therapy [facility] for tasks assessed/performed             Past Medical History:  Diagnosis Date   Allergic rhinitis    Allergy    Anemia    Anxiety    Blood transfusion without reported diagnosis    Cataract    bil cataracts removed   Clotting disorder ([place])    DVT- 1996 po knee replacement   Colitis    Complication of anesthesia    vomit x1 while on Morphine per pt   Depression    Diverticulosis    Frequency of urination    GERD (gastroesophageal reflux disease)    History of colon polyps    History of DVT of lower extremity    POST LEFT TOTAL KNEE  1996   History of hiatal hernia    History of iron deficiency anemia 1996   iron infusion   History of migraine headaches    History of rib fracture    Hyperlipidemia    IBS (irritable bowel syndrome)    Insomnia    MCI (mild cognitive impairment)    Melanoma ([place]) 2019   left leg    Migraine headache    hx of migraines    OA (osteoarthritis)    RIGHT SHOULDER   Osteopenia    Pneumonia    remote history   PONV (postoperative nausea and vomiting)    Recovering alcoholic ([place])    SINCE [id number]   Right rotator cuff tear    Substance abuse ([place])    recovering alcoholic   Unspecified essential hypertension     Past Surgical History:  Procedure Laterality Date   BUNIONECTOMY/   HAMMERTOE CORRECTION  RIGHT FOOT  2011   CATARACT EXTRACTION W/ INTRAOCULAR LENS  IMPLANT, BILATERAL     COLONOSCOPY     KNEE ARTHROSCOPY W/ MENISCECTOMY Bilateral X2  LEFT /    X1  RIGHT   KNEE OPEN LATERAL RELEASE Bilateral    MOHS SURGERY Left 12/15/2017   Melanoma in situ - left calf - [facility]   ORIF ANKLE FRACTURE Right 08/04/2020   Procedure: OPEN REDUCTION INTERNAL FIXATION (ORIF) ANKLE FRACTURE;  Surgeon: Wylene Simmer, MD;  Location: [facility];  Service: Orthopedics;  Laterality: Right;  Mini C-arm, [employer] Small Frag   REPLACEMENT TOTAL KNEE Left 2006   SHOULDER ARTHROSCOPY WITH SUBACROMIAL DECOMPRESSION, ROTATOR CUFF REPAIR AND BICEP TENDON REPAIR Right 05/23/2013   Procedure: RIGHT SHOULDER ARTHROSCOPY EXAM UNDER ANESTHESIA  WITH SUBACROMIAL DECOMPRESSION,DISTAL CLAVICLE RESECTION, SADLABRAL DEBRIDEMENT CHONDROPLASTY, BICEP TENOTOMY ;  Surgeon: Sydnee Cabal, MD;  Location: [facility];  Service: Orthopedics;  Laterality: Right;   TONSILLECTOMY AND ADENOIDECTOMY  AGE 75   TOTAL HIP ARTHROPLASTY Left 05/04/2017   Procedure: LEFT TOTAL HIP ARTHROPLASTY  ANTERIOR APPROACH;  Surgeon: Paralee Cancel, MD;  Location: [facility];  Service: Orthopedics;  Laterality: Left;   TOTAL HIP ARTHROPLASTY Right 08/01/2019   Procedure: TOTAL HIP ARTHROPLASTY ANTERIOR APPROACH;  Surgeon: Paralee Cancel, MD;  Location: [facility];  Service: Orthopedics;  Laterality: Right;  25mins   TOTAL KNEE ARTHROPLASTY Bilateral LEFT  1996/   RIGHT 2004   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    Subjective Assessment - 12/20/20 1451     Subjective Patient has experienced recurrent dizziness. During positional testing at ENT patient left upbeating nystagmus. Patient also had abnormal cVEMP suggesting bilateral peripheral vestibular hypofunction. Patient reports  when she stands she feels like falls to the L. Patient reports balance feels off with quick body and head turns. Reports she also continues to have spinning sensation when she lays down, gets up, and turning to the L side. Reports she has had approx 10 falls in the past 6 months. Patient reports she is only using the [facility] bc neurology stated it would be beneficial.    Pertinent History Anemia, Anxiety, Depression, GERD, HLD, MCI, IBS, Melanoma, Migraine, OA, Osteopenia, HTN    Limitations Walking;House hold activities    Patient Stated Goals Resolve the Dizziness; Improve the Balance.    Currently in Pain? No/denies                [facility] PT Assessment - 12/20/20 0001       Assessment   Medical Diagnosis Dizziness    Referring Provider (PT) Leta Baptist, MD    Onset Date/Surgical Date 11/18/20    Prior Therapy None for Vestibular      Precautions   Precautions Fall;Other (comment)    Precaution Comments Anemia, Anxiety, Depression, GERD, HLD, MCI, IBS, Melanoma, Migraine, OA, Osteopenia, HTN      Restrictions   Weight Bearing Restrictions No      Balance Screen   Has the patient fallen in the past 6 months Yes    How many times? 10    Has the patient had a decrease in activity level because of a fear of falling?  Yes    Is the patient reluctant to leave their home because of a fear of falling?  Yes      [place] residence    Living Arrangements Alone    Available Help at Discharge Family    Type of [place] to enter    Entrance Stairs-Number of Steps [place] One level    [place] - single point    Additional Comments reports mild ankle pain with stairs, denies dizziness/imbalance      Prior Function   Level of Independence Independent with household mobility without device;Independent with community mobility with device    Vocation Retired      Charity fundraiser Status Within  Functional Limits for tasks assessed      Observation/Other Assessments   Focus on Therapeutic Outcomes (FOTO)  DPS: 54 DFS: 46.9      Sensation   Light Touch Appears Intact      ROM / Strength   AROM / PROM / Strength Strength      Strength   Overall Strength Within functional  limits for tasks performed      Transfers   Transfers Sit to Stand;Stand to Sit    Sit to Stand 5: Supervision    Stand to Sit 5: Supervision    Comments mild unsteadiness      Ambulation/Gait   Ambulation/Gait Yes    Ambulation/Gait Assistance 5: Supervision    Ambulation/Gait Assistance Details ambulation into/out of therapy services with SPC, mild unsteadiness.    Ambulation Distance (Feet) 100 Feet    Assistive device Straight cane    Gait Pattern Step-through pattern    Ambulation Surface Level;Indoor                    Vestibular Assessment - 12/20/20 0001       Symptom Behavior   Subjective history of current problem See Subjective. Also reports history of migraines (reports rare) but does get nausea. Reports decreased gradual hearing loss in L ear.    Type of Dizziness  Imbalance;Unsteady with head/body turns;Vertigo;Spinning    Frequency of Dizziness daily    Duration of Dizziness seconds    Symptom Nature Motion provoked;Positional;Intermittent    Aggravating Factors Supine to sit;Lying supine;Mornings;Rolling to left    Relieving Factors Rest;Closing eyes;Head stationary    Progression of Symptoms No change since onset    History of similar episodes prior episode of BPPV      Oculomotor Exam   Oculomotor Alignment Normal    Ocular ROM WNL    Spontaneous Absent    Gaze-induced  Absent    Smooth Pursuits Intact    Saccades Intact      Oculomotor Exam-Fixation Suppressed    Left Head Impulse Positive    Right Head Impulse Positive   mild dizziness     Vestibulo-Ocular Reflex   VOR 1 Head Only (x 1 viewing) Normal.    VOR to Slow Head Movement Normal    VOR  Cancellation Normal      Visual Acuity   Static 10    Dynamic 8   no dizziness; just blurry vision     Positional Testing   Dix-Hallpike Dix-Hallpike Right;Dix-Hallpike Left    Horizontal Canal Testing Horizontal Canal Right;Horizontal Canal Left      Dix-Hallpike Right   Dix-Hallpike Right Duration 0    Dix-Hallpike Right Symptoms No nystagmus      Dix-Hallpike Left   Dix-Hallpike Left Duration 15    Dix-Hallpike Left Symptoms Upbeat, left rotatory nystagmus      Horizontal Canal Right   Horizontal Canal Right Duration 0    Horizontal Canal Right Symptoms Normal      Horizontal Canal Left   Horizontal Canal Left Duration 10    Horizontal Canal Left Symptoms Other (comment)   nystagmus, very mild rotary component noted seen               Objective measurements completed on examination: See above findings.        Vestibular Treatment/Exercise - 12/20/20 0001       Vestibular Treatment/Exercise   Vestibular Treatment Provided Canalith Repositioning    Canalith Repositioning Epley Manuever Left       EPLEY MANUEVER LEFT   Number of Reps  1    Overall Response  Improved Symptoms     RESPONSE DETAILS LEFT no nystagmus noted on reassessment. patient reports reduced symptoms.                    PT Education - 12/20/20 1455  Education Details Educated on POC/Evaluation Findings    Person(s) Educated Patient    Methods Explanation    Comprehension Verbalized understanding              PT Short Term Goals - 12/20/20 1619       PT SHORT TERM GOAL #1   Title Patient will be independent with initial HEP for vestibular/balance (ALL STGs Due: 01/17/21)    Baseline no HEP established    Time 4    Period Weeks    Status New    Target Date 01/17/21      PT SHORT TERM GOAL #2   Title Patient will demo negative positional testing to indicate resolution of BPPV    Baseline L BPPV    Time 4    Period Weeks    Status New      PT SHORT TERM  GOAL #3   Title FGA TBA and LTG to be set    Baseline TBA    Time 4    Period Weeks    Status New               PT Long Term Goals - 12/20/20 1621       PT LONG TERM GOAL #1   Title Patient will be indepdent with final HEP for vestibular/balance (ALL LTGs Due: 02/14/21)    Baseline no HEP established    Time 8    Period Weeks    Status New    Target Date 02/14/21      PT LONG TERM GOAL #2   Title Patient will improve DPS>/= 60 and DFS to >/= 50    Baseline DFS: 46.9, DPS: 54    Time 8    Period Weeks    Status New      PT LONG TERM GOAL #3   Title LTG to be set for FGA    Baseline TBA    Time 8    Period Weeks    Status New      PT LONG TERM GOAL #4   Title Patient will demo < 2 line difference on DVA to indicate VOR    Baseline 2 line difference blurred vision    Time 8    Period Weeks    Status New                    Plan - 12/20/20 1534     Clinical Impression Statement Patient is a 75 y.o. female referred to Neuro OPPT services for Dizziness. Patient's PMH significant for the following: Anemia, Anxiety, Depression, GERD, HLD, MCI, IBS, Melanoma, Migraine, OA, Osteopenia, HTN. Upon evaluation patient presents with the following impairments: decreased balance, dizziness, increased fall risk, positive bilat HIT and positive positional testing. Patient demo WNL DVA however reporting blurred vision, this along with B HIT and ENT testing confirm potential bilaterally vestibular hypofunction. With positional testing, patient demonstrating L rotary upbeating nystagmus of short duration indicating L Posterior Canal Canalithiasis. Completed CRM x 1 reps with improved symptoms noted. Patient will benefit from further balance asssesment, did not complete today due to unsteadiness after CRM. Patient will benefit from skilled PT to address impairments, maximize functional mobility, and reduce fall risk.    Personal Factors and Comorbidities Comorbidity 3+     Comorbidities Anemia, Anxiety, Depression, GERD, HLD, MCI, IBS, Melanoma, Migraine, OA, Osteopenia, HTN    Examination-Activity Limitations Bed Mobility;Stand;Locomotion Level;Reach Overhead;Bend;Transfers    Examination-Participation Restrictions Volunteer;Cleaning;Community Activity;Driving  Stability/Clinical Decision Making Stable/Uncomplicated    Clinical Decision Making Low    Rehab Potential Good    PT Frequency 1x / week    PT Duration 8 weeks    PT Treatment/Interventions ADLs/Self Care Home Management;Canalith Repostioning;Cryotherapy;Electrical Stimulation;Moist Heat;DME Instruction;Gait training;Stair training;Functional mobility training;Therapeutic activities;Therapeutic exercise;Balance training;Neuromuscular re-education;Patient/family education;Manual techniques;Passive range of motion;Dry needling;Vestibular    PT Next Visit Plan Reassess L Posterior BPPV, treat as indicated. Complete FGA once cleared. Initiate balance/VOR HEP    Consulted and Agree with Plan of Care Patient             Patient will benefit from skilled therapeutic intervention in order to improve the following deficits and impairments:     Visit Diagnosis: Dizziness and giddiness  BPPV (benign paroxysmal positional vertigo), left  Unsteadiness on feet  Repeated falls     Problem List Patient Active Problem List   Diagnosis Date Noted   PAF (paroxysmal atrial fibrillation) ([place]) 10/22/2020   Family history of heart disease 10/22/2020   Trimalleolar fracture 07/31/2020   Right hip OA 08/01/2019   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   Insomnia 02/03/2019   Onychomycosis 11/28/2018   Obese 05/05/2017   S/P left THA, 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   MCI (mild cognitive impairment) 09/19/2015   Memory deficit 09/19/2015   SIRS (systemic inflammatory response syndrome) ([place]) 10/29/2014   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   Depression 11/16/2013   ADD (attention deficit  disorder) 11/17/2011   Essential hypertension, benign 10/15/2009   SEBORRHEIC KERATOSIS, INFLAMED 10/17/2008   CARPAL TUNNEL SYNDROME, LEFT 06/22/2008   Allergic rhinitis 08/30/2007   Hyperlipidemia 11/11/2006   Migraine 11/11/2006   Osteoarthritis 11/11/2006    Jones Bales, PT 12/20/2020, 4:25 PM  [facility] [address] [place], [place], [address] Phone: [phone]   Fax:  [phone]  Name: Tonya Bartlett MRN: [id number] Date of Birth: [birth year]

## 2020-12-24 ENCOUNTER — Other Ambulatory Visit: Payer: Self-pay

## 2020-12-24 ENCOUNTER — Ambulatory Visit: Payer: Medicare Other | Admitting: Physical Therapy

## 2020-12-24 DIAGNOSIS — R42 Dizziness and giddiness: Secondary | ICD-10-CM | POA: Diagnosis not present

## 2020-12-24 DIAGNOSIS — R2681 Unsteadiness on feet: Secondary | ICD-10-CM

## 2020-12-24 DIAGNOSIS — H8112 Benign paroxysmal vertigo, left ear: Secondary | ICD-10-CM | POA: Diagnosis not present

## 2020-12-24 DIAGNOSIS — R296 Repeated falls: Secondary | ICD-10-CM

## 2020-12-24 NOTE — Therapy (Signed)
[facility] [address] [place], [place], [address] Phone: [phone]   Fax:  [phone]  Physical Therapy Treatment  Patient Details  Name: Tonya Bartlett MRN: [id number] Date of Birth: August 30, 1945 Referring Provider (PT): Leta Baptist, MD   Encounter Date: 12/24/2020   PT End of Session - 12/24/20 1648     Visit Number 2    Number of Visits 9    Date for PT Re-Evaluation 02/14/21    Authorization Type Medicare    Progress Note Due on Visit 10    PT Start Time 1400    PT Stop Time 0354    PT Time Calculation (min) 43 min    Activity Tolerance Patient tolerated treatment well    Behavior During Therapy [facility] for tasks assessed/performed             Past Medical History:  Diagnosis Date   Allergic rhinitis    Allergy    Anemia    Anxiety    Blood transfusion without reported diagnosis    Cataract    bil cataracts removed   Clotting disorder ([place])    DVT- 1996 po knee replacement   Colitis    Complication of anesthesia    vomit x1 while on Morphine per pt   Depression    Diverticulosis    Frequency of urination    GERD (gastroesophageal reflux disease)    History of colon polyps    History of DVT of lower extremity    POST LEFT TOTAL KNEE  1996   History of hiatal hernia    History of iron deficiency anemia 1996   iron infusion   History of migraine headaches    History of rib fracture    Hyperlipidemia    IBS (irritable bowel syndrome)    Insomnia    MCI (mild cognitive impairment)    Melanoma ([place]) 2019   left leg    Migraine headache    hx of migraines    OA (osteoarthritis)    RIGHT SHOULDER   Osteopenia    Pneumonia    remote history   PONV (postoperative nausea and vomiting)    Recovering alcoholic ([place])    SINCE [id number]   Right rotator cuff tear    Substance abuse ([place])    recovering alcoholic   Unspecified essential hypertension     Past Surgical History:  Procedure  Laterality Date   BUNIONECTOMY/  HAMMERTOE CORRECTION  RIGHT FOOT  2011   CATARACT EXTRACTION W/ INTRAOCULAR LENS  IMPLANT, BILATERAL     COLONOSCOPY     KNEE ARTHROSCOPY W/ MENISCECTOMY Bilateral X2  LEFT /    X1  RIGHT   KNEE OPEN LATERAL RELEASE Bilateral    MOHS SURGERY Left 12/15/2017   Melanoma in situ - left calf - [facility]   ORIF ANKLE FRACTURE Right 08/04/2020   Procedure: OPEN REDUCTION INTERNAL FIXATION (ORIF) ANKLE FRACTURE;  Surgeon: Wylene Simmer, MD;  Location: [facility];  Service: Orthopedics;  Laterality: Right;  Mini C-arm, [employer] Small Frag   REPLACEMENT TOTAL KNEE Left 2006   SHOULDER ARTHROSCOPY WITH SUBACROMIAL DECOMPRESSION, ROTATOR CUFF REPAIR AND BICEP TENDON REPAIR Right 05/23/2013   Procedure: RIGHT SHOULDER ARTHROSCOPY EXAM UNDER ANESTHESIA  WITH SUBACROMIAL DECOMPRESSION,DISTAL CLAVICLE RESECTION, SADLABRAL DEBRIDEMENT CHONDROPLASTY, BICEP TENOTOMY ;  Surgeon: Sydnee Cabal, MD;  Location: 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   Subjective Assessment - 12/24/20 1406     Subjective Pt using SPC today but reporting significant improvement in dizziness.  No dizziness when getting out of the bed.  Still having imbalance but that is due to weakness.    Pertinent  History Anemia, Anxiety, Depression, GERD, HLD, MCI, IBS, Melanoma, Migraine, OA, Osteopenia, HTN    Limitations Walking;House hold activities    Patient Stated Goals Resolve the Dizziness; Improve the Balance.    Currently in Pain? No/denies                [facility] PT Assessment - 12/24/20 1414       Functional Gait  Assessment   Gait assessed  Yes    Gait Level Surface Walks 20 ft in less than 7 sec but greater than 5.5 sec, uses assistive device, slower speed, mild gait deviations, or deviates 6-10 in outside of the 12 in walkway width.   pain in ankle   Change in Gait Speed Makes only minor adjustments to walking speed, or accomplishes a change in speed with significant gait deviations, deviates 10-15 in outside the 12 in walkway width, or changes speed but loses balance but is able to recover and continue walking.   unable due to pain in ankle   Gait with Horizontal Head Turns Performs head turns smoothly with slight change in gait velocity (eg, minor disruption to smooth gait path), deviates 6-10 in outside 12 in walkway width, or uses an assistive device.    Gait with Vertical Head Turns Performs task with slight change in gait velocity (eg, minor disruption to smooth gait path), deviates 6 - 10 in outside 12 in walkway width or uses assistive device    Gait and Pivot Turn Pivot turns safely within 3 sec and stops quickly with no loss of balance.    Step Over Obstacle Is able to step over one shoe box (4.5 in total height) but must slow down and adjust steps to clear box safely. May require verbal cueing.    Gait with Narrow Base of Support Ambulates less than 4 steps heel to toe or cannot perform without assistance.    Gait with Eyes Closed Walks 20 ft, uses assistive device, slower speed, mild gait deviations, deviates 6-10 in outside 12 in walkway width. Ambulates 20 ft in less than 9 sec but greater than 7 sec.    Ambulating Backwards Walks 20 ft, uses assistive device, slower speed,  mild gait deviations, deviates 6-10 in outside 12 in walkway width.    Steps Alternating feet, must use rail.    Total Score 17    FGA comment: 17/30 - most deficits due to ankle pain                 Vestibular Assessment - 12/24/20 1410  Positional Testing   Dix-Hallpike Dix-Hallpike Left    Horizontal Canal Testing Horizontal Canal Left      Dix-Hallpike Left   Dix-Hallpike Left Duration 0    Dix-Hallpike Left Symptoms No nystagmus      Horizontal Canal Left   Horizontal Canal Left Duration 0    Horizontal Canal Left Symptoms Normal                       Vestibular Treatment/Exercise - 12/24/20 1445       Vestibular Treatment/Exercise   Gaze Exercises X1 Viewing Horizontal;X1 Viewing Vertical      X1 Viewing Horizontal   Foot Position standing feet apart    Comments 60 seconds      X1 Viewing Vertical   Foot Position standing feet apart    Comments 60 seconds                Balance Exercises - 12/24/20 1446       Balance Exercises: Standing   Tandem Stance Eyes open;Upper extremity support 1;Other reps (comment);Limitations    Tandem Stance Time partial tandem, R then L foot forwards.  10 reps head turns and head nods                PT Education - 12/24/20 1647     Education Details results of FGA assessment; resolution of BPPV, initiation of vestibular and balance HEP    Person(s) Educated Patient    Methods Explanation;Demonstration;Handout    Comprehension Verbalized understanding;Returned demonstration              PT Short Term Goals - 12/24/20 1653       PT SHORT TERM GOAL #1   Title Patient will be independent with initial HEP for vestibular/balance (ALL STGs Due: 01/17/21)    Baseline no HEP established    Time 4    Period Weeks    Status New    Target Date 01/17/21      PT SHORT TERM GOAL #2   Title Patient will demo negative positional testing to indicate resolution of BPPV    Baseline resolved  on 12/24/20    Time 4    Period Weeks    Status Achieved      PT SHORT TERM GOAL #3   Title Pt will improve FGA to >/= 19/30    Baseline 16/30    Time 4    Period Weeks    Status Revised               PT Long Term Goals - 12/24/20 1654       PT LONG TERM GOAL #1   Title Patient will be indepdent with final HEP for vestibular/balance (ALL LTGs Due: 02/14/21)    Baseline no HEP established    Time 8    Period Weeks    Status New      PT LONG TERM GOAL #2   Title Patient will improve DPS>/= 60 and DFS to >/= 50    Baseline DFS: 46.9, DPS: 54    Time 8    Period Weeks    Status New      PT LONG TERM GOAL #3   Title FGA will increase to >/= 21/30 to indicate decreased falls risk    Time 8    Period Weeks    Status New      PT LONG TERM GOAL #4   Title Patient will demo < 2 line  difference on DVA to indicate VOR    Baseline 2 line difference blurred vision    Time 8    Period Weeks    Status New                   Plan - 12/24/20 1650     Clinical Impression Statement Pt presents with complete resolution of L BPPV.  Pt does present with increased risk for falls during more dynamic gait challenges but most of patient's balance impairments are due to ankle weakness, instability and pain.  Initiated vestibular and balance HEP focusing on balance with more narrow BOS.  Will continue to address and progress towards LTG.    Personal Factors and Comorbidities Comorbidity 3+    Comorbidities Anemia, Anxiety, Depression, GERD, HLD, MCI, IBS, Melanoma, Migraine, OA, Osteopenia, HTN    Examination-Activity Limitations Bed Mobility;Stand;Locomotion Level;Reach Overhead;Bend;Transfers    Examination-Participation Restrictions Volunteer;Cleaning;Community Activity;Driving    Stability/Clinical Decision Making Stable/Uncomplicated    Rehab Potential Good    PT Frequency 1x / week    PT Duration 8 weeks    PT Treatment/Interventions ADLs/Self Care Home  Management;Canalith Repostioning;Cryotherapy;Electrical Stimulation;Moist Heat;DME Instruction;Gait training;Stair training;Functional mobility training;Therapeutic activities;Therapeutic exercise;Balance training;Neuromuscular re-education;Patient/family education;Manual techniques;Passive range of motion;Dry needling;Vestibular    PT Next Visit Plan If symptoms return, re-assess L BPPV and treat if indicated.  Progress HEP and continue to work on VOR, sensory integration, ankle strategy - narrow BOS on solid surface    Consulted and Agree with Plan of Care Patient             Patient will benefit from skilled therapeutic intervention in order to improve the following deficits and impairments:     Visit Diagnosis: Dizziness and giddiness  Unsteadiness on feet  BPPV (benign paroxysmal positional vertigo), left  Repeated falls     Problem List Patient Active Problem List   Diagnosis Date Noted   PAF (paroxysmal atrial fibrillation) ([place]) 10/22/2020   Family history of heart disease 10/22/2020   Trimalleolar fracture 07/31/2020   Right hip OA 08/01/2019   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   Insomnia 02/03/2019   Onychomycosis 11/28/2018   Obese 05/05/2017   S/P left THA, 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 [place], [place], [address] Phone:  [phone]   Fax:  [phone]  Name: LAHOMA CONSTANTIN MRN: [id number] Date of Birth: 14-Dec-1945

## 2020-12-24 NOTE — Patient Instructions (Signed)
Gaze Stabilization: Standing Feet Together    Feet together, keeping eyes on target on wall __3__ feet away, tilt head down 15-30 and move head side to side for __60__ seconds. Repeat while moving head up and down for __60__ seconds. Do __2__ sessions per day.    Feet Partial Heel-Toe, Head Motion - Eyes Open    With eyes open, right foot partially in front of the left, move head slowly: left and right, 10 times keeping your balance.  Use light finger tip touch on a chair if needed. Switch feet, left forwards, right foot back.  Perform 10 head nods, up and down. Do 2 sessions per day.

## 2020-12-25 ENCOUNTER — Ambulatory Visit (INDEPENDENT_AMBULATORY_CARE_PROVIDER_SITE_OTHER): Payer: Medicare Other | Admitting: Cardiovascular Disease

## 2020-12-25 ENCOUNTER — Encounter: Payer: Self-pay | Admitting: Cardiovascular Disease

## 2020-12-25 ENCOUNTER — Telehealth: Payer: Self-pay | Admitting: Pharmacist

## 2020-12-25 VITALS — BP 145/80 | HR 60 | Ht 66.0 in | Wt 224.4 lb

## 2020-12-25 DIAGNOSIS — I10 Essential (primary) hypertension: Secondary | ICD-10-CM

## 2020-12-25 DIAGNOSIS — E782 Mixed hyperlipidemia: Secondary | ICD-10-CM | POA: Diagnosis not present

## 2020-12-25 DIAGNOSIS — I480 Paroxysmal atrial fibrillation: Secondary | ICD-10-CM

## 2020-12-25 MED ORDER — APIXABAN 5 MG PO TABS: 5.0000 mg | ORAL_TABLET | Freq: Two times a day (BID) | ORAL | 1 refills | Status: DC

## 2020-12-25 MED ORDER — ATORVASTATIN CALCIUM 20 MG PO TABS: 20.0000 mg | ORAL_TABLET | Freq: Every day | ORAL | 3 refills | Status: DC

## 2020-12-25 NOTE — Patient Instructions (Addendum)
Medication Instructions:  Pharmacist to start Eliquis today  Start Atorvastatin 20 mg every afternoon after dinner   *If you need a refill on your cardiac medications before your next appointment, please call your pharmacy*   Lab Work: Lipid and Hepatic panels in 3 months  Lab order enclosed   Testing/Procedures: None ordered   Follow-Up: At [facility], you and your health needs are our priority.  As part of our continuing mission to provide you with exceptional heart care, we have created designated Provider Care Teams.  These Care Teams include your primary Cardiologist (physician) and Advanced Practice Providers (APPs -  Physician Assistants and Nurse Practitioners) who all work together to provide you with the care you need, when you need it.  We recommend signing up for the patient portal called "MyChart".  Sign up information is provided on this After Visit Summary.  MyChart is used to connect with patients for Virtual Visits (Telemedicine).  Patients are able to view lab/test results, encounter notes, upcoming appointments, etc.  Non-urgent messages can be sent to your provider as well.   To learn more about what you can do with MyChart, go to [email or web address].      Your next appointment:  6 months   Call in Feb to schedule May appointment     The format for your next appointment: Office   Provider:  Dr.Berry 1}   Schedule appointment with AFib Clinic

## 2020-12-25 NOTE — Progress Notes (Signed)
Tonya Bartlett returns today for follow-up of her outpatient diagnostic test performed because of A. fib.  Her 2D echo performed 12/06/2020 was entirely normal.  Her event monitor showed long runs of PAF. The CHA2DSVASC2 score is 4  .  She will need to get started on an oral anticoagulant such as Eliquis.  Her coronary calcium score was 24 with nonobstructive disease.  I am going to start her on atorvastatin 20 mg a day and we will recheck a lipid liver profile in 3 months.  Her last lipid profile performed 04/23/2020 revealed total cholesterol 177 with an LDL of 92.  Her LDL goal will be less than 70.  I am also going to refer her to the A. fib clinic for further evaluation management of her atrial fibrillation.  I will see her back in 6 months.  Her twelve-lead EKG today reveals sinus rhythm with LVH voltage.  [name], M.D., [place], [facility], [place] [address] [place], [place]  [address]  [phone] 12/25/2020 3:15 PM

## 2020-12-25 NOTE — Telephone Encounter (Signed)
Per Dr Gwenlyn Found, start patient on Eliquis.    Pt was started on Eliquis for A Fib on 12/25/20  Reviewed patients medication list.  Pt is not currently on any combined P-gp and strong CYP3A4 inhibitors/inducers (ketoconazole, traconazole, ritonavir, carbamazepine, phenytoin, rifampin, St. John's wort).  Reviewed labs.  SCr 0.59, Weight 101kg,.  Dose appropriate based on age, weight, and SCr.  Hgb and HCT Within Normal Limits  A full discussion of the nature of anticoagulants has been carried out.  A benefit/risk analysis has been presented to the patient, so that they understand the justification for choosing anticoagulation with Eliquis at this time.  The need for compliance is stressed.  Pt is aware to take the medication twice daily.  Side effects of potential bleeding are discussed, including unusual colored urine or stools, coughing up blood or coffee ground emesis, nose bleeds or serious fall or head trauma.  Discussed signs and symptoms of stroke. The patient should avoid any OTC items containing aspirin or ibuprofen.  Avoid alcohol consumption.   Call if any signs of abnormal bleeding.  Discussed financial obligations and resolved any difficulty in obtaining medication.  Next lab test in 6 months.

## 2021-01-01 ENCOUNTER — Telehealth: Payer: Self-pay | Admitting: Adult Health

## 2021-01-01 DIAGNOSIS — M150 Primary generalized (osteo)arthritis: Secondary | ICD-10-CM | POA: Diagnosis not present

## 2021-01-01 DIAGNOSIS — M5030 Other cervical disc degeneration, unspecified cervical region: Secondary | ICD-10-CM | POA: Diagnosis not present

## 2021-01-01 DIAGNOSIS — M1990 Unspecified osteoarthritis, unspecified site: Secondary | ICD-10-CM

## 2021-01-01 DIAGNOSIS — Z6836 Body mass index (BMI) 36.0-36.9, adult: Secondary | ICD-10-CM | POA: Diagnosis not present

## 2021-01-01 DIAGNOSIS — G5622 Lesion of ulnar nerve, left upper limb: Secondary | ICD-10-CM | POA: Diagnosis not present

## 2021-01-01 DIAGNOSIS — M25511 Pain in right shoulder: Secondary | ICD-10-CM | POA: Diagnosis not present

## 2021-01-01 DIAGNOSIS — M5136 Other intervertebral disc degeneration, lumbar region: Secondary | ICD-10-CM | POA: Diagnosis not present

## 2021-01-01 DIAGNOSIS — L4050 Arthropathic psoriasis, unspecified: Secondary | ICD-10-CM | POA: Diagnosis not present

## 2021-01-01 DIAGNOSIS — L401 Generalized pustular psoriasis: Secondary | ICD-10-CM | POA: Diagnosis not present

## 2021-01-01 DIAGNOSIS — E669 Obesity, unspecified: Secondary | ICD-10-CM | POA: Diagnosis not present

## 2021-01-01 DIAGNOSIS — M8580 Other specified disorders of bone density and structure, unspecified site: Secondary | ICD-10-CM | POA: Diagnosis not present

## 2021-01-01 NOTE — Telephone Encounter (Signed)
Patient called to see when she last got a vaccine for whooping cough and diptheria as she will be visiting her new grandchild during thanksgiving. Patient also was seen by the cardiologist and the cardiologist recommended she follow up with her pcp for order bone density exams as it has been over two years.      Good callback number is [phone]    Please advise

## 2021-01-01 NOTE — Telephone Encounter (Signed)
Pt advised that she does not need a Tdap vaccine. Pt had a visit in March for CPE. Per last Dexa scan 04/01/2018 pt was advised to f/u in 2 years. Is it ok to place her bone density order?

## 2021-01-01 NOTE — Telephone Encounter (Signed)
Scan ordered. Pt notified of update

## 2021-01-02 ENCOUNTER — Other Ambulatory Visit: Payer: Self-pay | Admitting: Adult Health

## 2021-01-02 ENCOUNTER — Ambulatory Visit
Admission: RE | Admit: 2021-01-02 | Discharge: 2021-01-02 | Disposition: A | Discharge status: Home / Self Care | Payer: Medicare Other | Source: Ambulatory Visit | Attending: Adult Health | Admitting: Adult Health

## 2021-01-02 DIAGNOSIS — R921 Mammographic calcification found on diagnostic imaging of breast: Secondary | ICD-10-CM

## 2021-01-02 DIAGNOSIS — R922 Inconclusive mammogram: Secondary | ICD-10-CM | POA: Diagnosis not present

## 2021-01-03 ENCOUNTER — Ambulatory Visit: Payer: Medicare Other

## 2021-01-03 NOTE — Therapy (Signed)
[facility] [address] [place], [place], [address] Phone: [phone]   Fax:  [phone]  Patient Details  Name: Tonya Bartlett MRN: [id number] Date of Birth: 1946/01/02 Referring Provider:  No ref. provider found  Encounter Date: 01/03/2021  PHYSICAL THERAPY DISCHARGE SUMMARY  Visits from Start of Care: 2  Current functional level related to goals / functional outcomes: Patient called and cancelled all remaining visits due to resolution of Vertigo.    Remaining deficits: None   Education / Equipment: HEP provided   Patient agrees to discharge. Patient goals were  not met due to inability to assess due to cancelling all future appt . Patient is being discharged due to the patient's request.   Jones Bales, PT, DPT 01/03/2021, 1:17 PM  [facility] [address] [place], [place], [address] Phone: [phone]   Fax:  [phone]

## 2021-01-06 ENCOUNTER — Ambulatory Visit
Admission: RE | Admit: 2021-01-06 | Discharge: 2021-01-06 | Disposition: A | Discharge status: Home / Self Care | Payer: Medicare Other | Source: Ambulatory Visit | Attending: Adult Health | Admitting: Adult Health

## 2021-01-06 ENCOUNTER — Other Ambulatory Visit (HOSPITAL_COMMUNITY): Payer: Self-pay | Admitting: Diagnostic Radiology

## 2021-01-06 ENCOUNTER — Other Ambulatory Visit: Payer: Self-pay

## 2021-01-06 DIAGNOSIS — R921 Mammographic calcification found on diagnostic imaging of breast: Secondary | ICD-10-CM | POA: Diagnosis not present

## 2021-01-06 DIAGNOSIS — D0512 Intraductal carcinoma in situ of left breast: Secondary | ICD-10-CM | POA: Diagnosis not present

## 2021-01-06 HISTORY — PX: BREAST BIOPSY: SHX20

## 2021-01-07 ENCOUNTER — Other Ambulatory Visit: Payer: Self-pay | Admitting: Adult Health

## 2021-01-07 DIAGNOSIS — Z853 Personal history of malignant neoplasm of breast: Secondary | ICD-10-CM

## 2021-01-10 ENCOUNTER — Encounter: Payer: Self-pay | Admitting: *Deleted

## 2021-01-10 DIAGNOSIS — D0512 Intraductal carcinoma in situ of left breast: Secondary | ICD-10-CM | POA: Insufficient documentation

## 2021-01-14 NOTE — Progress Notes (Signed)
Radiation Oncology         [phone] ________________________________  Multidisciplinary Breast Oncology Clinic [facility]) Initial Outpatient Consultation  Name: Tonya Bartlett MRN: [id number]  Date: 01/15/2021  DOB: 1946-01-18  YK:[id number], Tommi Rumps, NP  [name], MD   REFERRING PHYSICIAN: [name], MD  DIAGNOSIS: The encounter diagnosis was Ductal carcinoma in situ (DCIS) of left breast.  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 Ductal carcinoma in situ (DCIS) of left breast  D05.12       HISTORY OF PRESENT ILLNESS::Tonya Bartlett is a 75 y.o. female who is presenting to the office today for evaluation of her newly diagnosed breast cancer. She is accompanied by her sister in-law. She is doing well overall.   The patient initially presented for mammography on 05/30/20 which showed likely benign left breast calcifications.   For subsequent follow up of presumed benign left breast calcifications, she underwent unilateral left diagnostic mammography at [facility] on 01/02/21 showing: the indeterminate calcifications in the upper outer left breast to have increased in number and extent since prior mammogram on 05/30/20.   Left breast biopsy at the 2:30 o'clock position on 01/06/21 showed: ductal carcinoma in-situ with necrosis and calcifications measuring 0.5 cm in the greatest linear extent. Prognostic indicators significant for: estrogen receptor, 100% positive with strong staining intensity, and progesterone receptor, 70% positive with moderate staining intensity.    Menarche: 75 years old Age at first live birth: 75 years old GP: 3 LMP: August, 1976 Contraceptive: In her 66's HRT: yes for 8 years, stopped around 15 years ago  PREVIOUS RADIATION THERAPY: No  PAST MEDICAL HISTORY:  Past Medical History:  Diagnosis Date   Allergic rhinitis    Allergy    Anemia    Anxiety    Blood  transfusion without reported diagnosis    Cataract    bil cataracts removed   Clotting disorder ([place])    DVT- 1996 po knee replacement   Colitis    Complication of anesthesia    vomit x1 while on Morphine per pt   Depression    Diverticulosis    Frequency of urination    GERD (gastroesophageal reflux disease)    History of colon polyps    History of DVT of lower extremity    POST LEFT TOTAL KNEE  1996   History of hiatal hernia    History of iron deficiency anemia 1996   iron infusion   History of migraine headaches    History of rib fracture    Hyperlipidemia    Hypertension    IBS (irritable bowel syndrome)    Insomnia    MCI (mild cognitive impairment)    Melanoma ([place]) 2019   left leg    Migraine headache    hx of migraines    OA (osteoarthritis)    RIGHT SHOULDER   Osteopenia    Pneumonia    remote history   PONV (postoperative nausea and vomiting)    Recovering alcoholic ([place])    SINCE [id number]   Right rotator cuff tear    Substance abuse ([place])    recovering alcoholic   Unspecified essential hypertension     PAST SURGICAL HISTORY: Past Surgical History:  Procedure Laterality Date   BUNIONECTOMY/  HAMMERTOE CORRECTION  RIGHT FOOT  2011   CATARACT EXTRACTION W/ INTRAOCULAR LENS  IMPLANT, BILATERAL     COLONOSCOPY  KNEE ARTHROSCOPY W/ MENISCECTOMY Bilateral X2  LEFT /    X1  RIGHT   KNEE OPEN LATERAL RELEASE Bilateral    MOHS SURGERY Left 12/15/2017   Melanoma in situ - left calf - [facility]   ORIF ANKLE FRACTURE Right 08/04/2020   Procedure: OPEN REDUCTION INTERNAL FIXATION (ORIF) ANKLE FRACTURE;  Surgeon: Wylene Simmer, MD;  Location: [facility];  Service: Orthopedics;  Laterality: Right;  Mini C-arm, [employer] Small Frag   REPLACEMENT TOTAL KNEE Left 2006   SHOULDER ARTHROSCOPY WITH SUBACROMIAL DECOMPRESSION, ROTATOR CUFF REPAIR AND BICEP TENDON REPAIR Right 05/23/2013   Procedure: RIGHT SHOULDER ARTHROSCOPY EXAM UNDER ANESTHESIA  WITH  SUBACROMIAL DECOMPRESSION,DISTAL CLAVICLE RESECTION, SADLABRAL DEBRIDEMENT CHONDROPLASTY, BICEP TENOTOMY ;  Surgeon: Sydnee Cabal, MD;  Location: [facility];  Service: Orthopedics;  Laterality: Right;   TONSILLECTOMY AND ADENOIDECTOMY  AGE 53   TOTAL HIP ARTHROPLASTY Left 05/04/2017   Procedure: LEFT TOTAL HIP ARTHROPLASTY ANTERIOR APPROACH;  Surgeon: Paralee Cancel, MD;  Location: [facility];  Service: Orthopedics;  Laterality: Left;   TOTAL HIP ARTHROPLASTY Right 08/01/2019   Procedure: TOTAL HIP ARTHROPLASTY ANTERIOR APPROACH;  Surgeon: Paralee Cancel, MD;  Location: [facility];  Service: Orthopedics;  Laterality: Right;  93mins   TOTAL KNEE ARTHROPLASTY Bilateral LEFT  1996/   RIGHT 2004   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      UPPER GASTROINTESTINAL ENDOSCOPY     UPPER GI ENDOSCOPY     VAGINAL HYSTERECTOMY  1976    FAMILY HISTORY:  Family History  Problem Relation Age of Onset   Heart disease Father 29   Hypertension Father    Melanoma Mother    Cancer Sister        skin CA, 2 sisters   Esophageal cancer Brother    Dementia Paternal Grandfather    Colon cancer Neg Hx    Colon polyps Neg Hx    Stomach cancer Neg Hx    Rectal cancer Neg Hx     SOCIAL HISTORY:  Social History   Socioeconomic History   Marital status: Widowed    Spouse name: Not on file   Number of children: Not on file   Years of education: Not on file   Highest education level: Master's degree (e.g., MA, MS, MEng, MEd, MSW, MBA)  Occupational History   Not on file  Tobacco Use   Smoking status: Former    Packs/day: 0.50    Years: 15.00    Pack years: 7.50    Types: Cigarettes    Quit date: 05/15/1985    Years since quitting: 35.6   Smokeless tobacco: Never  Vaping Use   Vaping Use: Never used  Substance and Sexual Activity   Alcohol use: No    Comment: RECOVERING ALCOHOLIC--  QUIT [id number]   Drug use: No   Sexual activity: Never    Birth control/protection: Post-menopausal  Other  Topics Concern   Not on file  Social History Narrative   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    Widowed    Three kids    72 grandchildren       Social Determinants of Radio broadcast assistant Strain: Low Risk    Difficulty of Paying Living Expenses: Not hard at all  Food Insecurity: No Food Insecurity   Worried About Charity fundraiser in the Last Year: Never true   Arboriculturist in the Last Year: Never true  Transportation  Needs: No Transportation Needs   Lack of Transportation (Medical): No   Lack of Transportation (Non-Medical): No  Physical Activity: Insufficiently Active   Days of Exercise per Week: 7 days   Minutes of Exercise per Session: 20 min  Stress: No Stress Concern Present   Feeling of Stress : Only a little  Social Connections: Moderately Isolated   Frequency of Communication with Friends and Family: More than three times a week   Frequency of Social Gatherings with Friends and Family: More than three times a week   Attends Religious Services: Never   Marine scientist or Organizations: Yes   Attends Archivist Meetings: 1 to 4 times per year   Marital Status: Widowed    ALLERGIES:  Allergies  Allergen Reactions   Otezla [Apremilast] Anaphylaxis    Suicidal ideation   Penicillins Anaphylaxis    Has patient had a PCN reaction causing immediate rash, facial/tongue/throat swelling, SOB or lightheadedness with hypotension: Yes Has patient had a PCN reaction causing severe rash involving mucus membranes or skin necrosis: Yes Has patient had a PCN reaction that required hospitalization: No Has patient had a PCN reaction occurring within the last 10 year No If all of the above answers are "NO", then may proceed with Cephalosporin use.    Erythromycin Other (See Comments)    SEVERE STOMACH CRAMPS   Morphine And Related Nausea And Vomiting   Nsaids Other (See Comments)    SEVERE STOMACH CRAMPS, MOUTH  SORES **Able to tolerate Tylenol   Remicade [Infliximab] Other (See Comments)    Shut down immune system-BP high   Tolmetin     SEVERE STOMACH CRAMPS   Nickel Rash    Including snaps on hospital gowns     MEDICATIONS:  Current Outpatient Medications  Medication Sig Dispense Refill   acetaminophen (TYLENOL) 325 MG tablet 1 tablets as needed     apixaban (ELIQUIS) 5 MG TABS tablet Take 1 tablet (5 mg total) by mouth 2 (two) times daily. 180 tablet 1   Aspirin 81 MG CAPS Take by mouth. 1 Tab in the morning 1 tablet at bedtime     atorvastatin (LIPITOR) 20 MG tablet Take 1 tablet (20 mg total) by mouth daily. 90 tablet 3   betamethasone dipropionate 0.05 % lotion APPLY TOPICALLY TO THE AFFECTED AREA DAILY 60 mL 2   escitalopram (LEXAPRO) 10 MG tablet TAKE 1 AND 1/2 TABLETS(15 MG) BY MOUTH DAILY 135 tablet 0   famotidine (PEPCID) 20 MG tablet Take 1 tablet (20 mg total) by mouth daily as needed for heartburn or indigestion. 90 tablet 3   FIBER PO Take 1 capsule by mouth in the morning and at bedtime.     HYDROcodone-acetaminophen (NORCO/VICODIN) 5-325 MG tablet Take 1 tablet by mouth every 6 (six) hours as needed.     hydrocortisone cream 1 % Apply 1 application topically daily as needed for itching.     losartan (COZAAR) 100 MG tablet Take 100 mg by mouth daily.     sulfamethoxazole-trimethoprim (BACTRIM,SEPTRA) 400-80 MG tablet Take 1 tablet by mouth daily. For urethritis.     SUMAtriptan (IMITREX) 100 MG tablet TAKE 1 TABLET BY MOUTH AS NEEDED FOR MIGRAINE. MAY REPEAT DOSE IN 2 HOURS IF HEADACHE PERSISTS OR RECURS 10 tablet 0   traZODone (DESYREL) 50 MG tablet TAKE 1 TABLET(50 MG) BY MOUTH AT BEDTIME 90 tablet 1   Current Facility-Administered Medications  Medication Dose Route Frequency Provider Last Rate Last Admin   0.9 %  sodium chloride infusion  500 mL Intravenous Once Armbruster, [name], MD        REVIEW OF SYSTEMS: A 10+ POINT REVIEW OF SYSTEMS WAS OBTAINED including  neurology, dermatology, psychiatry, cardiac, respiratory, lymph, extremities, GI, GU, musculoskeletal, constitutional, reproductive, HEENT. On the provided form, she reports fatigue which affects her activities, stabbing, throbbing, and muscle ache like pain (broken ankle), hearing aids, pain/trouble swallowing, irregular heartbeat, heart palpitation, hernia, Hx of skin cancer, arthritis, and depression. She denies any other symptoms.    PHYSICAL EXAM:   Vitals with BMI 01/15/2021  Height 5\' 6"   Weight 225 lbs 8 oz  BMI 08.67  Systolic 619  Diastolic 75  Pulse 58   Lungs are clear to auscultation bilaterally. Heart has regular rate and rhythm. No palpable cervical, supraclavicular, or axillary adenopathy. Abdomen soft, non-tender, normal bowel sounds. Breast: Right breast with no palpable mass, nipple discharge, or bleeding. Left breast with extensive bruising centrally, with biopsy site in the UOQ, no obvious palpable mass, though difficult to tell with bruising. (Patient is on Eliquis)  KPS = 90  100 - Normal; no complaints; no evidence of disease. 90   - Able to carry on normal activity; minor signs or symptoms of disease. 80   - Normal activity with effort; some signs or symptoms of disease. 71   - Cares for self; unable to carry on normal activity or to do active work. 60   - Requires occasional assistance, but is able to care for most of his personal needs. 50   - Requires considerable assistance and frequent medical care. 80   - Disabled; requires special care and assistance. 24   - Severely disabled; hospital admission is indicated although death not imminent. 23   - Very sick; hospital admission necessary; active supportive treatment necessary. 10   - Moribund; fatal processes progressing rapidly. 0     - Dead  Karnofsky DA, Abelmann [place], Craver LS and Burchenal JH [phone]) The use of the nitrogen mustards in the palliative treatment of carcinoma: with particular reference to  bronchogenic carcinoma Cancer 1 634-56  LABORATORY DATA:  Lab Results  Component Value Date   WBC 4.3 01/15/2021   HGB 12.1 01/15/2021   HCT 35.7 (L) 01/15/2021   MCV 98.6 01/15/2021   PLT 262 01/15/2021   Lab Results  Component Value Date   NA 138 01/15/2021   K 3.7 01/15/2021   CL 104 01/15/2021   CO2 27 01/15/2021   Lab Results  Component Value Date   ALT 19 01/15/2021   AST 22 01/15/2021   ALKPHOS 68 01/15/2021   BILITOT 0.9 01/15/2021    PULMONARY FUNCTION TEST:   Recent Review Flowsheet Data   There is no flowsheet data to display.     RADIOGRAPHY: MM DIAG BREAST TOMO UNI LEFT  Result Date: 01/02/2021 CLINICAL DATA:  Short-term follow-up for probably benign left breast calcifications, initially assessed with diagnostic imaging on 05/31/2020. EXAM: DIGITAL DIAGNOSTIC UNILATERAL LEFT MAMMOGRAM WITH TOMOSYNTHESIS AND CAD TECHNIQUE: Left digital diagnostic mammography and breast tomosynthesis was performed. The images were evaluated with computer-aided detection. COMPARISON:  Previous exam(s). ACR Breast Density Category c: The breast tissue is heterogeneously dense, which may obscure small masses. Is FINDINGS: The group of calcifications in the upper outer left breast increased in number and overall extent, currently spanning 1.1 x 0.8 x 0.9 cm. There is no so shaded mass or distortion. There is no linearity or branching. IMPRESSION: 1. Indeterminate calcifications in the upper  outer left breast, increased in number and extent since the prior diagnostic study. Tissue sampling is recommended. RECOMMENDATION: 1. Stereotactic core needle biopsy of the left breast calcifications. This procedure was scheduled prior to the patient being discharged from the breast Center. I have discussed the findings and recommendations with the patient. If applicable, a reminder letter will be sent to the patient regarding the next appointment. BI-RADS CATEGORY  4: Suspicious. Electronically Signed    By: Lajean Manes M.D.   On: 01/02/2021 12:02  MM CLIP PLACEMENT LEFT  Result Date: 01/06/2021 CLINICAL DATA:  Evaluate post biopsy marker clip placement following stereotactic core needle biopsy of left breast calcifications. EXAM: 3D DIAGNOSTIC LEFT MAMMOGRAM POST STEREOTACTIC BIOPSY COMPARISON:  Previous exam(s). FINDINGS: 3D Mammographic images were obtained following stereotactic guided biopsy of left breast calcifications. The biopsy marking clip is in expected position at the site of biopsy. IMPRESSION: Appropriate positioning of the X shaped biopsy marking clip at the site of biopsy in the lateral left breast adjacent to residual calcifications. Final Assessment: Post Procedure Mammograms for Marker Placement Electronically Signed   By: Lajean Manes M.D.   On: 01/06/2021 11:03  MM LT BREAST BX W LOC DEV 1ST LESION IMAGE BX SPEC STEREO GUIDE  Addendum Date: 01/08/2021   ADDENDUM REPORT: 01/08/2021 14:02 ADDENDUM: Pathology revealed INTERMEDIATE GRADE DUCTAL CARCINOMA IN SITU WITH NECROSIS AND CALCIFICATIONS of the LEFT breast, lateral, 2:30 o'clock, (x clip). This was found to be concordant by Dr. Lajean Manes. Pathology results were discussed with the patient by telephone. The patient reported doing well after the biopsy with tenderness at the site. Post biopsy instructions and care were reviewed and questions were answered. The patient was encouraged to call [facility] for any additional concerns. My direct phone number was provided. The patient was referred to [facility] on January 15, 2021. Request for appointment for a RIGHT diagnotic mammogram was sent to Donette Larry, Sandy Scheduling Supervisor, via secure email on January 07, 2021. The patient was informed she would be contacted by our scheduling department to arrange this appointment Pathology results reported by Terie Purser, RN on  01/08/2021. Electronically Signed   By: Lajean Manes M.D.   On: 01/08/2021 14:02   Result Date: 01/08/2021 CLINICAL DATA:  Patient presents for stereotactic core needle biopsy of left breast calcifications. EXAM: LEFT BREAST STEREOTACTIC CORE NEEDLE BIOPSY COMPARISON:  Previous exams. FINDINGS: The patient and I discussed the procedure of stereotactic-guided biopsy including benefits and alternatives. We discussed the high likelihood of a successful procedure. We discussed the risks of the procedure including infection, bleeding, tissue injury, clip migration, and inadequate sampling. Informed written consent was given. The usual time out protocol was performed immediately prior to the procedure. Using sterile technique and 1% Lidocaine as local anesthetic, under stereotactic guidance, a 9 gauge vacuum assisted device was used to perform core needle biopsy of calcifications in the upper outer quadrant of the left breast using a lateral approach. Specimen radiograph was performed showing calcifications for which biopsy was performed. Specimens with calcifications are identified for pathology. Lesion quadrant: Upper outer quadrant At the conclusion of the procedure, an X shaped tissue marker clip was deployed into the biopsy cavity. Follow-up 2-view mammogram was performed and dictated separately. IMPRESSION: Stereotactic-guided biopsy of left breast calcifications. No apparent complications. Electronically Signed: By: Lajean Manes M.D. On: 01/06/2021 10:54     IMPRESSION: Stage 0 (cTis (  DCIS), cN0, cM0) Left Breast, Ductal Carcinoma in-situ, ER+ / PR+    Patient will be a good candidate for breast conservation with radiotherapy to the left breast. We discussed the general course of radiation, potential side effects, and toxicities with radiation and the patient is interested in this approach.   She will potentially be a candidate for the comet study, we discussed this today and she was given handouts to  review.   PLAN:  Left lumpectomy (assuming that she is not doing comet). Adjuvant radiation depending on final path Aromatase Inhibitor    ------------------------------------------------  Blair Promise, PhD, MD  This document serves as a record of services personally performed by [name], MD. It was created on his behalf by Roney Mans, a trained medical scribe. The creation of this record is based on the scribe's personal observations and the provider's statements to them. This document has been checked and approved by the attending provider.

## 2021-01-14 NOTE — Progress Notes (Signed)
[facility] NOTE  Patient Care Team: [name], NP as PCP - General (Family Medicine) [name], MD (Inactive) as Consulting Physician (Urology) Orth[facility], [facility] (Specialist) [name], [facility] as Pharmacist (Pharmacist) [name], RN as Oncology Nurse Navigator [name], RN as Oncology Nurse Navigator [name], MD as Consulting Physician (General Surgery) [name], MD as Consulting Physician (Hematology and Oncology) [name], MD as Consulting Physician (Radiation Oncology)  CHIEF COMPLAINTS/PURPOSE OF CONSULTATION:  Newly diagnosed left breast DCIS  HISTORY OF PRESENTING ILLNESS:  Tonya Bartlett 75 y.o. female is here because of recent diagnosis of DCIS of the left breast breast. Diagnostic mammogram on 01/02/2021 showed indeterminate calcifications in the upper outer left breast. Biopsy on 01/06/2021 showed DCIS and necrosis and calcifications, ER+(100%)/PR+(70%). She presents to the clinic today for initial evaluation and discussion of treatment options.   I reviewed her records extensively and collaborated the history with the patient.  SUMMARY OF ONCOLOGIC HISTORY: Oncology History  Ductal carcinoma in situ (DCIS) of left breast  01/06/2021 Initial Diagnosis   Diagnostic mammogram: indeterminate calcifications in the upper outer left breast. Biopsy: DCIS and necrosis and calcifications, ER+(100%)/PR+(70%).    01/15/2021 Cancer Staging   Staging form: Breast, AJCC 8th Edition - Clinical stage from 01/15/2021: Stage 0 (cTis (DCIS), cN0, cM0, ER+, PR+, HER2: Not Assessed) - Signed by [name], MD on 01/15/2021 Stage prefix: Initial diagnosis      MEDICAL HISTORY:  Past Medical History:  Diagnosis Date   Allergic rhinitis    Allergy    Anemia    Anxiety    Blood transfusion without reported diagnosis    Cataract    bil cataracts removed   Clotting disorder ([place])    DVT- 1996 po knee replacement    Colitis    Complication of anesthesia    vomit x1 while on Morphine per pt   Depression    Diverticulosis    Frequency of urination    GERD (gastroesophageal reflux disease)    History of colon polyps    History of DVT of lower extremity    POST LEFT TOTAL KNEE  1996   History of hiatal hernia    History of iron deficiency anemia 1996   iron infusion   History of migraine headaches    History of rib fracture    Hyperlipidemia    Hypertension    IBS (irritable bowel syndrome)    Insomnia    MCI (mild cognitive impairment)    Melanoma ([place]) 2019   left leg    Migraine headache    hx of migraines    OA (osteoarthritis)    RIGHT SHOULDER   Osteopenia    Pneumonia    remote history   PONV (postoperative nausea and vomiting)    Recovering alcoholic ([place])    SINCE [id number]   Right rotator cuff tear    Substance abuse ([place])    recovering alcoholic   Unspecified essential hypertension     SURGICAL HISTORY: Past Surgical History:  Procedure Laterality Date   BUNIONECTOMY/  HAMMERTOE CORRECTION  RIGHT FOOT  2011   CATARACT EXTRACTION W/ INTRAOCULAR LENS  IMPLANT, BILATERAL     COLONOSCOPY     KNEE ARTHROSCOPY W/ MENISCECTOMY Bilateral X2  LEFT /    X1  RIGHT   KNEE OPEN LATERAL RELEASE Bilateral    MOHS SURGERY Left 12/15/2017   Melanoma in situ - left calf - [facility]   ORIF  ANKLE FRACTURE Right 08/04/2020   Procedure: OPEN REDUCTION INTERNAL FIXATION (ORIF) ANKLE FRACTURE;  Surgeon: Wylene Simmer, MD;  Location: [facility];  Service: Orthopedics;  Laterality: Right;  Mini C-arm, [employer] Small Frag   REPLACEMENT TOTAL KNEE Left 2006   SHOULDER ARTHROSCOPY WITH SUBACROMIAL DECOMPRESSION, ROTATOR CUFF REPAIR AND BICEP TENDON REPAIR Right 05/23/2013   Procedure: RIGHT SHOULDER ARTHROSCOPY EXAM UNDER ANESTHESIA  WITH SUBACROMIAL DECOMPRESSION,DISTAL CLAVICLE RESECTION, SADLABRAL DEBRIDEMENT CHONDROPLASTY, BICEP TENOTOMY ;  Surgeon: Sydnee Cabal, MD;  Location:  [facility];  Service: Orthopedics;  Laterality: Right;   TONSILLECTOMY AND ADENOIDECTOMY  AGE 5   TOTAL HIP ARTHROPLASTY Left 05/04/2017   Procedure: LEFT TOTAL HIP ARTHROPLASTY ANTERIOR APPROACH;  Surgeon: Paralee Cancel, MD;  Location: [facility];  Service: Orthopedics;  Laterality: Left;   TOTAL HIP ARTHROPLASTY Right 08/01/2019   Procedure: TOTAL HIP ARTHROPLASTY ANTERIOR APPROACH;  Surgeon: Paralee Cancel, MD;  Location: [facility];  Service: Orthopedics;  Laterality: Right;  92mns   TOTAL KNEE ARTHROPLASTY Bilateral LEFT  1996/   RIGHT 2004   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      UPPER GASTROINTESTINAL ENDOSCOPY     UPPER GI ENDOSCOPY     VAGINAL HYSTERECTOMY  1976    SOCIAL HISTORY: Social History   Socioeconomic History   Marital status: Widowed    Spouse name: Not on file   Number of children: Not on file   Years of education: Not on file   Highest education level: Master's degree (e.g., MA, MS, MEng, MEd, MSW, MBA)  Occupational History   Not on file  Tobacco Use   Smoking status: Former    Packs/day: 0.50    Years: 15.00    Pack years: 7.50    Types: Cigarettes    Quit date: 05/15/1985    Years since quitting: 35.6   Smokeless tobacco: Never  Vaping Use   Vaping Use: Never used  Substance and Sexual Activity   Alcohol use: No    Comment: RECOVERING ALCOHOLIC--  QUIT [id number]  Drug use: No   Sexual activity: Never    Birth control/protection: Post-menopausal  Other Topics Concern   Not on file  Social History Narrative   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    Widowed    Three kids    618grandchildren       Social Determinants of HRadio broadcast assistantStrain: Low Risk    Difficulty of Paying Living Expenses: Not hard at all  Food Insecurity: No Food Insecurity   Worried About RCharity fundraiserin the Last Year: Never true   RArboriculturistin the Last Year: Never true  Transportation Needs: No  Transportation Needs   Lack of Transportation (Medical): No   Lack of Transportation (Non-Medical): No  Physical Activity: Insufficiently Active   Days of Exercise per Week: 7 days   Minutes of Exercise per Session: 20 min  Stress: No Stress Concern Present   Feeling of Stress : Only a little  Social Connections: Moderately Isolated   Frequency of Communication with Friends and Family: More than three times a week   Frequency of Social Gatherings with Friends and Family: More than three times a week   Attends Religious Services: Never   AMarine scientistor Organizations: Yes   Attends CArchivistMeetings: 1 to 4 times per year   Marital Status: Widowed  Intimate Partner Violence: Not  At Risk   Fear of Current or Ex-Partner: No   Emotionally Abused: No   Physically Abused: No   Sexually Abused: No    FAMILY HISTORY: Family History  Problem Relation Age of Onset   Heart disease Father 72   Hypertension Father    Melanoma Mother    Cancer Sister        skin CA, 2 sisters   Esophageal cancer Brother    Dementia Paternal Grandfather    Colon cancer Neg Hx    Colon polyps Neg Hx    Stomach cancer Neg Hx    Rectal cancer Neg Hx     ALLERGIES:  is allergic to otezla [apremilast], penicillins, erythromycin, morphine and related, nsaids, remicade [infliximab], tolmetin, and nickel.  MEDICATIONS:  Current Outpatient Medications  Medication Sig Dispense Refill   acetaminophen (TYLENOL) 325 MG tablet 1 tablets as needed     apixaban (ELIQUIS) 5 MG TABS tablet Take 1 tablet (5 mg total) by mouth 2 (two) times daily. 180 tablet 1   Aspirin 81 MG CAPS Take by mouth. 1 Tab in the morning 1 tablet at bedtime     atorvastatin (LIPITOR) 20 MG tablet Take 1 tablet (20 mg total) by mouth daily. 90 tablet 3   betamethasone dipropionate 0.05 % lotion APPLY TOPICALLY TO THE AFFECTED AREA DAILY 60 mL 2   escitalopram (LEXAPRO) 10 MG tablet TAKE 1 AND 1/2 TABLETS(15 MG) BY MOUTH  DAILY 135 tablet 0   famotidine (PEPCID) 20 MG tablet Take 1 tablet (20 mg total) by mouth daily as needed for heartburn or indigestion. 90 tablet 3   FIBER PO Take 1 capsule by mouth in the morning and at bedtime.     HYDROcodone-acetaminophen (NORCO/VICODIN) 5-325 MG tablet Take 1 tablet by mouth every 6 (six) hours as needed.     hydrocortisone cream 1 % Apply 1 application topically daily as needed for itching.     losartan (COZAAR) 100 MG tablet Take 100 mg by mouth daily.     sulfamethoxazole-trimethoprim (BACTRIM,SEPTRA) 400-80 MG tablet Take 1 tablet by mouth daily. For urethritis.     SUMAtriptan (IMITREX) 100 MG tablet TAKE 1 TABLET BY MOUTH AS NEEDED FOR MIGRAINE. MAY REPEAT DOSE IN 2 HOURS IF HEADACHE PERSISTS OR RECURS 10 tablet 0   traZODone (DESYREL) 50 MG tablet TAKE 1 TABLET(50 MG) BY MOUTH AT BEDTIME 90 tablet 1   Current Facility-Administered Medications  Medication Dose Route Frequency Provider Last Rate Last Admin   0.9 %  sodium chloride infusion  500 mL Intravenous Once Armbruster, [name], MD        REVIEW OF SYSTEMS:   Constitutional: Denies fevers, chills or abnormal night sweats Eyes: Denies blurriness of vision, double vision or watery eyes Ears, nose, mouth, throat, and face: Denies mucositis or sore throat Respiratory: Denies cough, dyspnea or wheezes Cardiovascular: Denies palpitation, chest discomfort or lower extremity swelling Gastrointestinal:  Denies nausea, heartburn or change in bowel habits Skin: Denies abnormal skin rashes Lymphatics: Denies new lymphadenopathy or easy bruising Neurological:Denies numbness, tingling or new weaknesses Behavioral/Psych: Mood is stable, no new changes  Breast:  Denies any palpable lumps or discharge All other systems were reviewed with the patient and are negative.  PHYSICAL EXAMINATION: ECOG PERFORMANCE STATUS: 1 - Symptomatic but completely ambulatory  Vitals:   01/15/21 1316  BP: (!) 154/75  Pulse: (!) 58   Resp: 18  Temp: 98.1 F (36.7 C)  SpO2: 98%   Filed Weights  01/15/21 1316  Weight: 225 lb 8 oz (102.3 kg)       LABORATORY DATA:  I have reviewed the data as listed Lab Results  Component Value Date   WBC 4.3 01/15/2021   HGB 12.1 01/15/2021   HCT 35.7 (L) 01/15/2021   MCV 98.6 01/15/2021   PLT 262 01/15/2021   Lab Results  Component Value Date   NA 138 01/15/2021   K 3.7 01/15/2021   CL 104 01/15/2021   CO2 27 01/15/2021    RADIOGRAPHIC STUDIES: I have personally reviewed the radiological reports and agreed with the findings in the report.  ASSESSMENT AND PLAN:  Ductal carcinoma in situ (DCIS) of left breast 01/06/2021:Diagnostic mammogram: indeterminate calcifications in the upper outer left breast. Biopsy: DCIS and necrosis and calcifications, ER+(100%)/PR+(70%).   Pathology review: I discussed with the patient the difference between DCIS and invasive breast cancer. It is considered a precancerous lesion. DCIS is classified as a 0. It is generally detected through mammograms as calcifications. We discussed the significance of grades and its impact on prognosis. We also discussed the importance of ER and PR receptors and their implications to adjuvant treatment options. Prognosis of DCIS dependence on grade, comedo necrosis. It is anticipated that if not treated, 20-30% of DCIS can develop into invasive breast cancer.  Recommendation: 1. Breast conserving surgery  2. Followed by adjuvant radiation therapy 3. Followed by antiestrogen therapy with tamoxifen 5 years  Tamoxifen counseling: We discussed the risks and benefits of tamoxifen. These include but not limited to insomnia, hot flashes, mood changes, vaginal dryness, and weight gain. Although rare, serious side effects including endometrial cancer, risk of blood clots were also discussed. We strongly believe that the benefits far outweigh the risks. Patient understands these risks and consented to starting  treatment. Planned treatment duration is 5 years.  We discussed Comet clinical trial but she does not want to participate in the trial. Return to clinic after surgery to discuss the final pathology report and come up     All questions were answered. The patient knows to call the clinic with any problems, questions or concerns.   Rulon Eisenmenger, MD, MPH 01/15/2021    I, Thana Ates, am acting as scribe for [name], MD.  I have reviewed the above documentation for accuracy and completeness, and I agree with the above.

## 2021-01-15 ENCOUNTER — Other Ambulatory Visit: Payer: Self-pay

## 2021-01-15 ENCOUNTER — Ambulatory Visit (HOSPITAL_BASED_OUTPATIENT_CLINIC_OR_DEPARTMENT_OTHER): Payer: Medicare Other | Admitting: Genetic Counselor

## 2021-01-15 ENCOUNTER — Ambulatory Visit
Admission: RE | Admit: 2021-01-15 | Discharge: 2021-01-15 | Disposition: A | Discharge status: Home / Self Care | Payer: Medicare Other | Source: Ambulatory Visit | Attending: Radiation Oncology | Admitting: Radiation Oncology

## 2021-01-15 ENCOUNTER — Ambulatory Visit: Payer: Medicare Other | Admitting: Physical Therapy

## 2021-01-15 ENCOUNTER — Inpatient Hospital Stay: Payer: Medicare Other | Attending: Hematology and Oncology

## 2021-01-15 ENCOUNTER — Inpatient Hospital Stay (HOSPITAL_BASED_OUTPATIENT_CLINIC_OR_DEPARTMENT_OTHER): Payer: Medicare Other | Admitting: Hematology and Oncology

## 2021-01-15 ENCOUNTER — Encounter: Payer: Self-pay | Admitting: *Deleted

## 2021-01-15 ENCOUNTER — Other Ambulatory Visit: Payer: Self-pay | Admitting: General Surgery

## 2021-01-15 DIAGNOSIS — D0512 Intraductal carcinoma in situ of left breast: Secondary | ICD-10-CM

## 2021-01-15 DIAGNOSIS — Z808 Family history of malignant neoplasm of other organs or systems: Secondary | ICD-10-CM | POA: Diagnosis not present

## 2021-01-15 DIAGNOSIS — I480 Paroxysmal atrial fibrillation: Secondary | ICD-10-CM | POA: Diagnosis not present

## 2021-01-15 DIAGNOSIS — E782 Mixed hyperlipidemia: Secondary | ICD-10-CM | POA: Diagnosis not present

## 2021-01-15 DIAGNOSIS — Z170 Estrogen receptor positive status [ER+]: Secondary | ICD-10-CM | POA: Diagnosis not present

## 2021-01-15 DIAGNOSIS — Z85820 Personal history of malignant melanoma of skin: Secondary | ICD-10-CM | POA: Diagnosis not present

## 2021-01-15 DIAGNOSIS — I10 Essential (primary) hypertension: Secondary | ICD-10-CM

## 2021-01-15 DIAGNOSIS — C50412 Malignant neoplasm of upper-outer quadrant of left female breast: Secondary | ICD-10-CM

## 2021-01-15 DIAGNOSIS — Z853 Personal history of malignant neoplasm of breast: Secondary | ICD-10-CM | POA: Diagnosis not present

## 2021-01-15 LAB — CMP (CANCER CENTER ONLY)
ALT: 19 U/L (ref 0–44)
AST: 22 U/L (ref 15–41)
Albumin: 4.4 g/dL (ref 3.5–5.0)
Alkaline Phosphatase: 68 U/L (ref 38–126)
Anion gap: 7 (ref 5–15)
BUN: 11 mg/dL (ref 8–23)
CO2: 27 mmol/L (ref 22–32)
Calcium: 9.2 mg/dL (ref 8.9–10.3)
Chloride: 104 mmol/L (ref 98–111)
Creatinine: 0.65 mg/dL (ref 0.44–1.00)
GFR, Estimated: >60 mL/min (ref >60)
Glucose, Bld: 91 mg/dL (ref 70–99)
Potassium: 3.7 mmol/L (ref 3.5–5.1)
Sodium: 138 mmol/L (ref 135–145)
Total Bilirubin: 0.9 mg/dL (ref 0.3–1.2)
Total Protein: 7.4 g/dL (ref 6.5–8.1)

## 2021-01-15 LAB — CBC WITH DIFFERENTIAL (CANCER CENTER ONLY)
Abs Immature Granulocytes: 0.02 10*3/uL (ref 0.00–0.07)
Basophils Absolute: 0.1 10*3/uL (ref 0.0–0.1)
Basophils Relative: 1 %
Eosinophils Absolute: 0.1 10*3/uL (ref 0.0–0.5)
Eosinophils Relative: 2 %
HCT: 35.7 % — ABNORMAL LOW (ref 36.0–46.0)
Hemoglobin: 12.1 g/dL (ref 12.0–15.0)
Immature Granulocytes: 1 %
Lymphocytes Relative: 20 %
Lymphs Abs: 0.9 10*3/uL (ref 0.7–4.0)
MCH: 33.4 pg (ref 26.0–34.0)
MCHC: 33.9 g/dL (ref 30.0–36.0)
MCV: 98.6 fL (ref 80.0–100.0)
Monocytes Absolute: 0.4 10*3/uL (ref 0.1–1.0)
Monocytes Relative: 8 %
Neutro Abs: 2.9 10*3/uL (ref 1.7–7.7)
Neutrophils Relative %: 68 %
Platelet Count: 262 10*3/uL (ref 150–400)
RBC: 3.62 MIL/uL — ABNORMAL LOW (ref 3.87–5.11)
RDW: 12.2 % (ref 11.5–15.5)
WBC Count: 4.3 10*3/uL (ref 4.0–10.5)
nRBC: 0 % (ref 0.0–0.2)

## 2021-01-15 LAB — GENETIC SCREENING ORDER

## 2021-01-15 NOTE — Assessment & Plan Note (Signed)
01/06/2021:Diagnostic mammogram: indeterminate calcifications in the upper outer left breast. Biopsy: DCIS and necrosis and calcifications, ER+(100%)/PR+(70%).   Pathology review: I discussed with the patient the difference between DCIS and invasive breast cancer. It is considered a precancerous lesion. DCIS is classified as a 0. It is generally detected through mammograms as calcifications. We discussed the significance of grades and its impact on prognosis. We also discussed the importance of ER and PR receptors and their implications to adjuvant treatment options. Prognosis of DCIS dependence on grade, comedo necrosis. It is anticipated that if not treated, 20-30% of DCIS can develop into invasive breast cancer.  Recommendation: 1. Breast conserving surgery versus Comet clinical trial 2. Followed by adjuvant radiation therapy 3. Followed by antiestrogen therapy with tamoxifen 5 years  Tamoxifen counseling: We discussed the risks and benefits of tamoxifen. These include but not limited to insomnia, hot flashes, mood changes, vaginal dryness, and weight gain. Although rare, serious side effects including endometrial cancer, risk of blood clots were also discussed. We strongly believe that the benefits far outweigh the risks. Patient understands these risks and consented to starting treatment. Planned treatment duration is 5 years.  Return to clinic after surgery to discuss the final pathology report and come up

## 2021-01-16 ENCOUNTER — Telehealth: Payer: Self-pay

## 2021-01-16 ENCOUNTER — Encounter: Payer: Self-pay | Admitting: Genetic Counselor

## 2021-01-16 ENCOUNTER — Encounter: Payer: Medicare Other | Admitting: Physical Therapy

## 2021-01-16 DIAGNOSIS — I700 Atherosclerosis of aorta: Secondary | ICD-10-CM

## 2021-01-16 DIAGNOSIS — Z808 Family history of malignant neoplasm of other organs or systems: Secondary | ICD-10-CM | POA: Insufficient documentation

## 2021-01-16 NOTE — Telephone Encounter (Signed)
Pharmacy, can you please comment on how long Eliquis can be held for upcoming procedure?  Thank you! 

## 2021-01-16 NOTE — Telephone Encounter (Signed)
   [facility] Pre-operative Risk Assessment    Patient Name: SEFORA TIETJE  DOB: Nov 10, 1945 MRN: [id number]  HEARTCARE STAFF:  - IMPORTANT!!!!!! Under Visit Info/Reason for Call, type in Other and utilize the format Clearance MM/DD/YY or Clearance TBD. Do not use dashes or single digits. - Please review there is not already an duplicate clearance open for this procedure. - If request is for dental extraction, please clarify the # of teeth to be extracted. - If the patient is currently at the dentist's office, call Pre-Op Callback Staff (MA/nurse) to input urgent request.  - If the patient is not currently in the dentist office, please route to the Pre-Op pool.  Request for surgical clearance:  What type of surgery is being performed? LEFT BREAST LUMPECTOMY   When is this surgery scheduled? TBD  What type of clearance is required (medical clearance vs. Pharmacy clearance to hold med vs. Both)? BOTH  Are there any medications that need to be held prior to surgery and how long? [place] name and name of physician performing surgery? [facility] [name], MD  ATTN:KELSEY PHILLIPS  What is the office phone number? [phone]   7.   What is the office fax number? [phone]  8.   Anesthesia type (None, local, MAC, general) ? GENERAL

## 2021-01-16 NOTE — Progress Notes (Signed)
REFERRING PROVIDER: [name], MD [place], [place] [address]  PRIMARY PROVIDER:  [name], NP  PRIMARY REASON FOR VISIT:  1. Ductal carcinoma in situ (DCIS) of left breast   2. Family history of melanoma     HISTORY OF PRESENT ILLNESS:   Tonya Bartlett, a 75 y.o. female, was seen for a [facility] cancer genetics consultation during the breast multidisciplinary clinic at the request of Dr. Lindi Adie due to a personal and family history of cancer.  Tonya Bartlett presents to clinic today to discuss the possibility of a hereditary predisposition to cancer, to discuss genetic testing, and to further clarify her future cancer risks, as well as potential cancer risks for family members.   In November 2022, at the age of 44, Tonya Bartlett was diagnosed with ductal carcinoma in situ of the left breast. She also has a personal history of melanoma diagnosed at age 57.  CANCER HISTORY:  Oncology History  Ductal carcinoma in situ (DCIS) of left breast  01/06/2021 Initial Diagnosis   Diagnostic mammogram: indeterminate calcifications in the upper outer left breast. Biopsy: DCIS and necrosis and calcifications, ER+(100%)/PR+(70%).    01/15/2021 Cancer Staging   Staging form: Breast, AJCC 8th Edition - Clinical stage from 01/15/2021: Stage 0 (cTis (DCIS), cN0, cM0, ER+, PR+, HER2: Not Assessed) - Signed by [name], MD on 01/15/2021 Stage prefix: Initial diagnosis      RISK FACTORS:  Menarche was at age 45.  First live birth at age 41.  OCP use in her 43s Uterus intact: no.  Menopausal status: postmenopausal.  HRT use:  15+  years. Colonoscopy: yes Mammogram within the last year: yes. Any excessive radiation exposure in the past: no  Past Medical History:  Diagnosis Date   Allergic rhinitis    Allergy    Anemia    Anxiety    Blood transfusion without reported diagnosis    Cataract    bil cataracts removed   Clotting disorder ([place])    DVT- 1996 po knee  replacement   Colitis    Complication of anesthesia    vomit x1 while on Morphine per pt   Depression    Diverticulosis    Frequency of urination    GERD (gastroesophageal reflux disease)    History of colon polyps    History of DVT of lower extremity    POST LEFT TOTAL KNEE  1996   History of hiatal hernia    History of iron deficiency anemia 1996   iron infusion   History of migraine headaches    History of rib fracture    Hyperlipidemia    Hypertension    IBS (irritable bowel syndrome)    Insomnia    MCI (mild cognitive impairment)    Melanoma ([place]) 2019   left leg    Migraine headache    hx of migraines    OA (osteoarthritis)    RIGHT SHOULDER   Osteopenia    Pneumonia    remote history   PONV (postoperative nausea and vomiting)    Recovering alcoholic ([place])    SINCE [id number]   Right rotator cuff tear    Substance abuse ([place])    recovering alcoholic   Unspecified essential hypertension     Past Surgical History:  Procedure Laterality Date   BUNIONECTOMY/  HAMMERTOE CORRECTION  RIGHT FOOT  2011   CATARACT EXTRACTION W/ INTRAOCULAR LENS  IMPLANT, BILATERAL     COLONOSCOPY     KNEE ARTHROSCOPY W/ MENISCECTOMY  Bilateral X2  LEFT /    X1  RIGHT   KNEE OPEN LATERAL RELEASE Bilateral    MOHS SURGERY Left 12/15/2017   Melanoma in situ - [facility] calf - [facility]   ORIF ANKLE FRACTURE Right 08/04/2020   Procedure: OPEN REDUCTION INTERNAL FIXATION (ORIF) ANKLE FRACTURE;  Surgeon: Wylene Simmer, MD;  Location: [facility];  Service: Orthopedics;  Laterality: Right;  Mini C-arm, [employer] Small Frag   REPLACEMENT TOTAL KNEE Left 2006   SHOULDER ARTHROSCOPY WITH SUBACROMIAL DECOMPRESSION, ROTATOR CUFF REPAIR AND BICEP TENDON REPAIR Right 05/23/2013   Procedure: RIGHT SHOULDER ARTHROSCOPY EXAM UNDER ANESTHESIA  WITH SUBACROMIAL DECOMPRESSION,DISTAL CLAVICLE RESECTION, SADLABRAL DEBRIDEMENT CHONDROPLASTY, BICEP TENOTOMY ;  Surgeon: Sydnee Cabal, MD;  Location: [place];  Service: Orthopedics;  Laterality: Right;   TONSILLECTOMY AND ADENOIDECTOMY  AGE 11   TOTAL HIP ARTHROPLASTY Left 05/04/2017   Procedure: LEFT TOTAL HIP ARTHROPLASTY ANTERIOR APPROACH;  Surgeon: Paralee Cancel, MD;  Location: [facility];  Service: Orthopedics;  Laterality: Left;   TOTAL HIP ARTHROPLASTY Right 08/01/2019   Procedure: TOTAL HIP ARTHROPLASTY ANTERIOR APPROACH;  Surgeon: Paralee Cancel, MD;  Location: [facility];  Service: Orthopedics;  Laterality: Right;  71mns   TOTAL KNEE ARTHROPLASTY Bilateral LEFT  1996/   RIGHT 2004   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      UPPER GASTROINTESTINAL ENDOSCOPY     UPPER GI ENDOSCOPY     VAGINAL HYSTERECTOMY  1976    Social History   Socioeconomic History   Marital status: Widowed    Spouse name: Not on file   Number of children: Not on file   Years of education: Not on file   Highest education level: Master's degree (e.g., MA, MS, MEng, MEd, MSW, MBA)  Occupational History   Not on file  Tobacco Use   Smoking status: Former    Packs/day: 0.50    Years: 15.00    Pack years: 7.50    Types: Cigarettes    Quit date: 05/15/1985    Years since quitting: 35.6   Smokeless tobacco: Never  Vaping Use   Vaping Use: Never used  Substance and Sexual Activity   Alcohol use: No    Comment: RECOVERING ALCOHOLIC--  QUIT [phone]  Drug use: No   Sexual activity: Never    Birth control/protection: Post-menopausal  Other Topics Concern   Not on file  Social History Narrative   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    Widowed    Three kids    671grandchildren       Social Determinants of HRadio broadcast assistantStrain: Low Risk    Difficulty of Paying Living Expenses: Not hard at all  Food Insecurity: No Food Insecurity   Worried About RCharity fundraiserin the Last Year: Never true   RArboriculturistin the Last Year: Never true  Transportation Needs: No Transportation Needs    Lack of Transportation (Medical): No   Lack of Transportation (Non-Medical): No  Physical Activity: Insufficiently Active   Days of Exercise per Week: 7 days   Minutes of Exercise per Session: 20 min  Stress: No Stress Concern Present   Feeling of Stress : Only a little  Social Connections: Moderately Isolated   Frequency of Communication with Friends and Family: More than three times a week   Frequency of Social Gatherings with Friends and Family: More than three times a week  Attends Religious Services: Never   Active Member of Clubs or Organizations: Yes   Attends Archivist Meetings: 1 to 4 times per year   Marital Status: Widowed     FAMILY HISTORY:  We obtained a detailed, 4-generation family history.  Significant diagnoses are listed below: Family History  Problem Relation Age of Onset   Melanoma Mother 76   Heart disease Father 85   Hypertension Father    Esophageal cancer Brother    Dementia Paternal Grandfather    Melanoma Niece 53   Colon cancer Neg Hx    Colon polyps Neg Hx    Stomach cancer Neg Hx    Rectal cancer Neg Hx      Tonya Bartlett's niece was diagnosed with melanoma at age 56. Her brother was diagnosed with esophageal cancer in his 39s, he did not smoke, he died due to metastasis at age 80. Her mother was diagnosed with melanoma at age 2 and died due to metastasis at age 69. Tonya Bartlett is unaware of previous family history of genetic testing for hereditary cancer risks. There is no reported Ashkenazi Jewish ancestry.   GENETIC COUNSELING ASSESSMENT: Tonya Bartlett is a 75 y.o. female with a personal and family history of cancer which is somewhat suggestive of a hereditary cancer syndrome and predisposition to cancer given multiple generations affected by cancer. We, therefore, discussed and recommended the following at today's visit.   DISCUSSION: We discussed that 5 - 10% of cancer is hereditary, with most cases of hereditary breast cancer associated  with mutations in BRCA1/2.  There are other genes that can be associated with hereditary breast and melanoma cancer syndromes. Type of cancer risk and level of risk are gene-specific. We discussed that testing is beneficial for several reasons including knowing how to follow individuals after completing their treatment, identifying whether potential treatment options would be beneficial, and understanding if other family members could be at risk for cancer and allowing them to undergo genetic testing.   We reviewed the characteristics, features and inheritance patterns of hereditary cancer syndromes. We also discussed genetic testing, including the appropriate family members to test, the process of testing, insurance coverage and turn-around-time for results. We discussed the implications of a negative, positive and/or variant of uncertain significant result. In order to get genetic test results in a timely manner so that Tonya Bartlett can use these genetic test results for surgical decisions, we recommended Tonya Bartlett pursue genetic testing for the BRCAplus. Once complete, we recommend Tonya Bartlett pursue reflex genetic testing to a more comprehensive gene panel.   Tonya Bartlett  was offered a common hereditary cancer panel (47 genes) and an expanded pan-cancer panel (77 genes). Tonya Bartlett was informed of the benefits and limitations of each panel, including that expanded pan-cancer panels contain genes that do not have clear management guidelines at this point in time.  We also discussed that as the number of genes included on a panel increases, the chances of variants of uncertain significance increases.  After considering the benefits and limitations of each gene panel, Tonya Bartlett elected to have [facility]'s CancerNext-Expanded Panel.   The CancerNext-Expanded gene panel offered by [facility] and includes sequencing, rearrangement, and RNA analysis for the following 77 genes: AIP, ALK, APC, ATM, AXIN2, BAP1,  BARD1, BLM, BMPR1A, BRCA1, BRCA2, BRIP1, CDC73, CDH1, CDK4, CDKN1B, CDKN2A, CHEK2, CTNNA1, DICER1, FANCC, FH, FLCN, GALNT12, KIF1B, LZTR1, MAX, MEN1, MET, MLH1, MSH2, MSH3, MSH6, MUTYH, NBN, NF1, NF2, NTHL1, PALB2, PHOX2B, PMS2,  POT1, PRKAR1A, PTCH1, PTEN, RAD51C, RAD51D, RB1, RECQL, RET, SDHA, SDHAF2, SDHB, SDHC, SDHD, SMAD4, SMARCA4, SMARCB1, SMARCE1, STK11, SUFU, TMEM127, TP53, TSC1, TSC2, VHL and XRCC2 (sequencing and deletion/duplication); EGFR, EGLN1, HOXB13, KIT, MITF, PDGFRA, POLD1, and POLE (sequencing only); EPCAM and GREM1 (deletion/duplication only).    Based on Tonya Bartlett's personal and family history of cancer, she does not meet medical criteria for genetic testing. However, with Medicare Part A/B, we know she will not be charged for the genetic testing.   PLAN: After considering the risks, benefits, and limitations, Tonya Bartlett provided informed consent to pursue genetic testing and the blood sample was sent to [facility] for analysis of the CancerNext-Expanded+RNA. Results should be available within approximately 1-2 weeks' time, at which point they will be disclosed by telephone to Tonya Bartlett, as will any additional recommendations warranted by these results. Tonya Bartlett will receive a summary of her genetic counseling visit and a copy of her results once available. This information will also be available in Epic.   Tonya Bartlett's questions were answered to her satisfaction today. Our contact information was provided should additional questions or concerns arise. Thank you for the referral and allowing [name] to share in the care of your patient.   Lucille Passy, MS, [facility] Genetic Counselor [email or web address] (P) [phone]  The patient was seen for a total of 20 minutes in face-to-face genetic counseling. The patient was seen alone.  Drs. Magrinat, Lindi Adie and/or Burr Medico were available to discuss this case as needed.   _______________________________________________________________________ For Office Staff:  Number of people involved in session: 1 Was an Intern/ student involved with case: no

## 2021-01-17 ENCOUNTER — Other Ambulatory Visit: Payer: Self-pay | Admitting: General Surgery

## 2021-01-17 DIAGNOSIS — C50412 Malignant neoplasm of upper-outer quadrant of left female breast: Secondary | ICD-10-CM

## 2021-01-17 DIAGNOSIS — I700 Atherosclerosis of aorta: Secondary | ICD-10-CM | POA: Insufficient documentation

## 2021-01-17 NOTE — Telephone Encounter (Signed)
   Primary Cardiologist: Quay Burow, MD  Chart reviewed as part of pre-operative protocol coverage. Given past medical history and time since last visit, based on ACC/AHA guidelines, CORINDA AMMON would be at acceptable risk for the planned procedure without further cardiovascular testing.   Patient with diagnosis of afib on Eliquis for anticoagulation.     Procedure: left breast lumpectomy Date of procedure: TBD   CHA2DS2-VASc Score = 5  This indicates a 7.2% annual risk of stroke. The patient's score is based upon: CHF History: 0 HTN History: 1 Diabetes History: 0 Stroke History: 0 Vascular Disease History: 1 Age Score: 2 Gender Score: 1    CrCl 30mL/min using adjusted body weight Platelet count 262K   Per office protocol, patient can hold Eliquis for 2 days prior to procedure.  I will route this recommendation to the requesting party via Epic fax function and remove from pre-op pool.  Please call with questions.  Jossie Ng. Dariel Betzer NP-C    01/17/2021, 11:15 AM [address] [address] [address] Office [phone] Fax [phone]

## 2021-01-17 NOTE — Telephone Encounter (Signed)
Patient with diagnosis of afib on Eliquis for anticoagulation.    Procedure: left breast lumpectomy Date of procedure: TBD  CHA2DS2-VASc Score = 5  This indicates a 7.2% annual risk of stroke. The patient's score is based upon: CHF History: 0 HTN History: 1 Diabetes History: 0 Stroke History: 0 Vascular Disease History: 1 Age Score: 2 Gender Score: 1   CrCl 27mL/min using adjusted body weight Platelet count 262K  Per office protocol, patient can hold Eliquis for 2 days prior to procedure.

## 2021-01-20 ENCOUNTER — Encounter: Payer: Self-pay | Admitting: *Deleted

## 2021-01-20 DIAGNOSIS — D0512 Intraductal carcinoma in situ of left breast: Secondary | ICD-10-CM

## 2021-01-21 ENCOUNTER — Telehealth: Payer: Self-pay | Admitting: Hematology and Oncology

## 2021-01-21 NOTE — Telephone Encounter (Signed)
Scheduled per sch msg. Called and spoke with patient. Confirmed appt  

## 2021-01-22 ENCOUNTER — Encounter: Payer: Self-pay | Admitting: General Practice

## 2021-01-22 NOTE — Progress Notes (Signed)
[place] Psychosocial Distress Screening Spiritual Care  Met with Tonya Bartlett by phone following Breast Multidisciplinary Clinic to introduce [facility] team/resources, reviewing distress screen per protocol.  The patient scored a 2 on the Psychosocial Distress Thermometer which indicates mild distress. Also assessed for distress and other psychosocial needs.   ONCBCN DISTRESS SCREENING 01/22/2021  Screening Type Initial Screening  Distress experienced in past week (1-10) 2  Physical Problem type Pain;Getting around;Tingling hands/feet  Referral to support programs Yes   Ms Tonya Bartlett reports little distress and many transferable coping tools from her professional background and medical history.    Follow up needed: No. Per Ms Tonya Bartlett, no other needs or concerns at this time, but she is aware of [facility] team and programming availability, should needs arise or circumstances change.   [place], [place], [facility] Pager [phone] Voicemail [phone]

## 2021-01-23 ENCOUNTER — Encounter: Payer: Self-pay | Admitting: *Deleted

## 2021-01-23 ENCOUNTER — Telehealth: Payer: Self-pay | Admitting: *Deleted

## 2021-01-23 NOTE — Telephone Encounter (Signed)
Left message for a return phone call to follow up from [facility] 11/30.

## 2021-01-28 ENCOUNTER — Ambulatory Visit: Payer: Medicare Other | Admitting: Neurology

## 2021-01-31 ENCOUNTER — Encounter: Payer: Self-pay | Admitting: Genetic Counselor

## 2021-01-31 ENCOUNTER — Telehealth: Payer: Self-pay | Admitting: Genetic Counselor

## 2021-01-31 ENCOUNTER — Ambulatory Visit
Admission: RE | Admit: 2021-01-31 | Discharge: 2021-01-31 | Disposition: A | Discharge status: Home / Self Care | Payer: Medicare Other | Source: Ambulatory Visit | Attending: Adult Health | Admitting: Adult Health

## 2021-01-31 DIAGNOSIS — R922 Inconclusive mammogram: Secondary | ICD-10-CM | POA: Diagnosis not present

## 2021-01-31 DIAGNOSIS — Z853 Personal history of malignant neoplasm of breast: Secondary | ICD-10-CM

## 2021-01-31 DIAGNOSIS — Z1379 Encounter for other screening for genetic and chromosomal anomalies: Secondary | ICD-10-CM | POA: Insufficient documentation

## 2021-01-31 NOTE — Telephone Encounter (Signed)
I contacted Ms. Vanatta to discuss her genetic testing results. No pathogenic variants were identified in the 77 genes analyzed. Detailed clinic note to follow.  The test report has been scanned into EPIC and is located under the Molecular Pathology section of the Results Review tab.  A portion of the result report is included below for reference.   Lucille Passy, MS, [facility] Genetic Counselor [email or web address] (P) [phone]

## 2021-01-31 NOTE — Telephone Encounter (Signed)
I attempted to contact Tonya Bartlett to discuss her genetic testing results (77 genes). I left a voicemail requesting she call me back at [phone].  Lucille Passy, MS, [facility] Genetic Counselor [email or web address] (P) [phone]

## 2021-02-03 ENCOUNTER — Ambulatory Visit: Payer: Self-pay | Admitting: Genetic Counselor

## 2021-02-03 DIAGNOSIS — Z1379 Encounter for other screening for genetic and chromosomal anomalies: Secondary | ICD-10-CM

## 2021-02-03 DIAGNOSIS — D0512 Intraductal carcinoma in situ of left breast: Secondary | ICD-10-CM

## 2021-02-03 NOTE — Progress Notes (Signed)
HPI:   Tonya Bartlett was previously seen in the [facility] clinic due to a personal and family history of cancer and concerns regarding a hereditary predisposition to cancer. Please refer to our prior cancer genetics clinic note for more information regarding our discussion, assessment and recommendations, at the time. Tonya Bartlett's recent genetic test results were disclosed to Tonya Bartlett, as were recommendations warranted by these results. These results and recommendations are discussed in more detail below.  CANCER HISTORY:  Oncology History  Ductal carcinoma in situ (DCIS) of left breast  01/06/2021 Initial Diagnosis   Diagnostic mammogram: indeterminate calcifications in the upper outer left breast. Biopsy: DCIS and necrosis and calcifications, ER+(100%)/PR+(70%).    01/15/2021 Cancer Staging   Staging form: Breast, AJCC 8th Edition - Clinical stage from 01/15/2021: Stage 0 (cTis (DCIS), cN0, cM0, ER+, PR+, HER2: Not Assessed) - Signed by [name], MD on 01/15/2021 Stage prefix: Initial diagnosis     Genetic Testing   [facility] CancerNext-Expanded Panel is Negative. Report date is 01/28/2021.  The CancerNext-Expanded gene panel offered by [facility] and includes sequencing, rearrangement, and RNA analysis for the following 77 genes: AIP, ALK, APC, ATM, AXIN2, BAP1, BARD1, BLM, BMPR1A, BRCA1, BRCA2, BRIP1, CDC73, CDH1, CDK4, CDKN1B, CDKN2A, CHEK2, CTNNA1, DICER1, FANCC, FH, FLCN, GALNT12, KIF1B, LZTR1, MAX, MEN1, MET, MLH1, MSH2, MSH3, MSH6, MUTYH, NBN, NF1, NF2, NTHL1, PALB2, PHOX2B, PMS2, POT1, PRKAR1A, PTCH1, PTEN, RAD51C, RAD51D, RB1, RECQL, RET, SDHA, SDHAF2, SDHB, SDHC, SDHD, SMAD4, SMARCA4, SMARCB1, SMARCE1, STK11, SUFU, TMEM127, TP53, TSC1, TSC2, VHL and XRCC2 (sequencing and deletion/duplication); EGFR, EGLN1, HOXB13, KIT, MITF, PDGFRA, POLD1, and POLE (sequencing only); EPCAM and GREM1 (deletion/duplication only).      FAMILY HISTORY:  We obtained a detailed,  4-generation family history.  Significant diagnoses are listed below:      Family History  Problem Relation Age of Onset   Melanoma Mother 33   Heart disease Father 10   Hypertension Father     Esophageal cancer Brother     Dementia Paternal Grandfather     Melanoma Niece 70   Colon cancer Neg Hx     Colon polyps Neg Hx     Stomach cancer Neg Hx     Rectal cancer Neg Hx         Tonya Bartlett's niece was diagnosed with melanoma at age 58. Tonya Bartlett brother was diagnosed with esophageal cancer in his 64s, he did not smoke, he died due to metastasis at age 19. Tonya Bartlett mother was diagnosed with melanoma at age 16 and died due to metastasis at age 49. Tonya Bartlett is unaware of previous family history of genetic testing for hereditary cancer risks. There is no reported Ashkenazi Jewish ancestry.   GENETIC TEST RESULTS:  The [facility] CancerNext-Expanded Panel found no pathogenic mutations.  The CancerNext-Expanded gene panel offered by [facility] and includes sequencing, rearrangement, and RNA analysis for the following 77 genes: AIP, ALK, APC, ATM, AXIN2, BAP1, BARD1, BLM, BMPR1A, BRCA1, BRCA2, BRIP1, CDC73, CDH1, CDK4, CDKN1B, CDKN2A, CHEK2, CTNNA1, DICER1, FANCC, FH, FLCN, GALNT12, KIF1B, LZTR1, MAX, MEN1, MET, MLH1, MSH2, MSH3, MSH6, MUTYH, NBN, NF1, NF2, NTHL1, PALB2, PHOX2B, PMS2, POT1, PRKAR1A, PTCH1, PTEN, RAD51C, RAD51D, RB1, RECQL, RET, SDHA, SDHAF2, SDHB, SDHC, SDHD, SMAD4, SMARCA4, SMARCB1, SMARCE1, STK11, SUFU, TMEM127, TP53, TSC1, TSC2, VHL and XRCC2 (sequencing and deletion/duplication); EGFR, EGLN1, HOXB13, KIT, MITF, PDGFRA, POLD1, and POLE (sequencing only); EPCAM and GREM1 (deletion/duplication only).    The test report has been scanned into EPIC and is  located under the Molecular Pathology section of the Results Review tab.  A portion of the result report is included below for reference. Genetic testing reported out on 01/28/2021.       Even though a pathogenic variant was not  identified, possible explanations for Tonya Bartlett personal history of cancer may include: There may be no hereditary risk for cancer in the family. The cancers in Tonya Bartlett and/or Tonya Bartlett family may be due to other genetic or environmental factors. There may be a gene mutation in one of these genes that current testing methods cannot detect, but that chance is small. There could be another gene that has not yet been discovered, or that we have not yet tested, that is responsible for the cancer diagnoses in the family.   Therefore, it is important to remain in touch with cancer genetics in the future so that we can continue to offer Tonya Bartlett the most up to date genetic testing.   ADDITIONAL GENETIC TESTING:  We discussed with Tonya Bartlett that Tonya Bartlett genetic testing was fairly extensive.  If there are genes identified to increase cancer risk that can be analyzed in the future, we would be happy to discuss and coordinate this testing at that time.    CANCER SCREENING RECOMMENDATIONS:  Tonya Bartlett's test result is considered negative (normal). This means that we have not identified a hereditary cause for Tonya Bartlett personal and family history of cancer at this time.   An individual's cancer risk and medical management are not determined by genetic test results alone. Overall cancer risk assessment incorporates additional factors, including personal medical history, family history, and any available genetic information that may result in a personalized plan for cancer prevention and surveillance. Therefore, it is recommended she continue to follow the cancer management and screening guidelines provided by Tonya Bartlett oncology and primary healthcare provider.  RECOMMENDATIONS FOR FAMILY MEMBERS:   Since she did not inherit a mutation in a cancer predisposition gene included on this panel, Tonya Bartlett children could not have inherited a mutation from Tonya Bartlett in one of these genes. Individuals in this family might be at some increased risk of  developing cancer, over the general population risk, due to the family history of cancer. We recommend women in this family have a yearly mammogram beginning at age 55, or 26 years younger than the earliest onset of cancer, an annual clinical breast exam, and perform monthly breast self-exams.  FOLLOW-UP:  Cancer genetics is a rapidly advancing field and it is possible that new genetic tests will be appropriate for Tonya Bartlett and/or Tonya Bartlett family members in the future. We encouraged Tonya Bartlett to remain in contact with cancer genetics on an annual basis so we can update Tonya Bartlett personal and family histories and let Tonya Bartlett know of advances in cancer genetics that may benefit this family.   Our contact number was provided. Tonya Bartlett's questions were answered to Tonya Bartlett satisfaction, and she knows she is welcome to call us at anytime with additional questions or concerns.   Lucille Passy, MS, [facility] Genetic Counselor [email or web address] (P) [phone]

## 2021-02-04 ENCOUNTER — Telehealth: Payer: Self-pay | Admitting: Cardiovascular Disease

## 2021-02-04 NOTE — Telephone Encounter (Signed)
Would defer to MD in this situation

## 2021-02-04 NOTE — Telephone Encounter (Signed)
Spoke with pt regarding Dr. Kennon Holter recommendations. Order was placed at her recent office visit for referral to a-fib clinic. Provided pt with number to the clinic so she could reach out for an appointment. Will attempt to contact a-fib clinic as well to expedite appointment. Pt verbalizes understanding.

## 2021-02-04 NOTE — Telephone Encounter (Signed)
STAT if HR is under 50 or over 120 (normal HR is 60-100 beats per minute)  What is your heart rate? 106 dropped to 57, 61 right now  Do you have a log of your heart rate readings (document readings)?   Do you have any other symptoms?  No energy, short of breath when she walks the steps

## 2021-02-04 NOTE — Telephone Encounter (Signed)
Spoke with pt regarding her heart rate.  Pt states that she can be sitting and resting and he heart rate will jump up to 106bpm and then within a few seconds to minutes it can drop back down to 60bpm. Pt states that she doesn't always notice this but when she does it is because she is trying be active. Pt states that recently with these episodes she is feeling low energy and can be short of breath with episodes. Pt recently had a monitor which informed us that she was having episodes of a-fib and long runs of SVT. Per monitor results when pt was in a-fib heart rate was typically in the low 100s and in SVT upwards of 150s to 160s. Pt is not currently on any anti-arhythmic medication or beta blocker. Will forward this message to Dr. Gwenlyn Found and PharmD team to review and advise further. Pt verbalizes understanding.

## 2021-02-06 ENCOUNTER — Ambulatory Visit (HOSPITAL_COMMUNITY)
Admission: RE | Admit: 2021-02-06 | Discharge: 2021-02-06 | Disposition: A | Discharge status: Home / Self Care | Payer: Medicare Other | Source: Ambulatory Visit | Attending: Nurse Practitioner | Admitting: Nurse Practitioner

## 2021-02-06 ENCOUNTER — Encounter (HOSPITAL_COMMUNITY): Payer: Self-pay | Admitting: Nurse Practitioner

## 2021-02-06 ENCOUNTER — Other Ambulatory Visit: Payer: Self-pay

## 2021-02-06 VITALS — BP 144/90 | HR 89 | Ht 66.0 in | Wt 223.0 lb

## 2021-02-06 DIAGNOSIS — I480 Paroxysmal atrial fibrillation: Secondary | ICD-10-CM | POA: Insufficient documentation

## 2021-02-06 DIAGNOSIS — C50919 Malignant neoplasm of unspecified site of unspecified female breast: Secondary | ICD-10-CM | POA: Insufficient documentation

## 2021-02-06 DIAGNOSIS — D6869 Other thrombophilia: Secondary | ICD-10-CM | POA: Diagnosis not present

## 2021-02-06 DIAGNOSIS — I10 Essential (primary) hypertension: Secondary | ICD-10-CM | POA: Insufficient documentation

## 2021-02-06 DIAGNOSIS — Z7901 Long term (current) use of anticoagulants: Secondary | ICD-10-CM | POA: Diagnosis not present

## 2021-02-06 MED ORDER — METOPROLOL TARTRATE 25 MG PO TABS: 12.5000 mg | ORAL_TABLET | Freq: Two times a day (BID) | ORAL | 3 refills | Status: DC

## 2021-02-06 NOTE — Patient Instructions (Signed)
Start metoprolol 12.5mg  twice a day (1/2 tablet twice a day)

## 2021-02-06 NOTE — Progress Notes (Signed)
Primary Care Physician: [name], NP Referring Physician:Dr. Vivia Birmingham Tonya Bartlett is a 75 y.o. female with a h/o HTN, newly dx afib in the afib clinic for evaluation. She reports that with her apple watch she can see jumps in HR when she is just sitting. These episodes have not been sustained but several times a day. She is reporting fatigue. She is pending surgery the first of the year for breast cancer. Her apple watch does not run ekg's but she is going to update her watch soon to be able to do this. She was started on eliquis 5 mg bid for a CHA2DS2VASc score of at least 4. No tobacco, alcohol or excessive caffeine.   Today, she denies symptoms of palpitations, chest pain, shortness of breath, orthopnea, PND, lower extremity edema, dizziness, presyncope, syncope, or neurologic sequela. The patient is tolerating medications without difficulties and is otherwise without complaint today.   Past Medical History:  Diagnosis Date   Allergic rhinitis    Allergy    Anemia    Anxiety    Blood transfusion without reported diagnosis    Cataract    bil cataracts removed   Clotting disorder ([place])    DVT- 1996 po knee replacement   Colitis    Complication of anesthesia    vomit x1 while on Morphine per pt   Depression    Diverticulosis    Frequency of urination    GERD (gastroesophageal reflux disease)    History of colon polyps    History of DVT of lower extremity    POST LEFT TOTAL KNEE  1996   History of hiatal hernia    History of iron deficiency anemia 1996   iron infusion   History of migraine headaches    History of rib fracture    Hyperlipidemia    Hypertension    IBS (irritable bowel syndrome)    Insomnia    MCI (mild cognitive impairment)    Melanoma ([place]) 2019   left leg    Migraine headache    hx of migraines    OA (osteoarthritis)    RIGHT SHOULDER   Osteopenia    Pneumonia    remote history   PONV (postoperative nausea and vomiting)    Recovering  alcoholic ([place])    SINCE [id number]   Right rotator cuff tear    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   Unspecified essential hypertension    Past Surgical History:  Procedure Laterality Date   BREAST BIOPSY Left 01/06/2021   pos   BUNIONECTOMY/  HAMMERTOE CORRECTION  RIGHT FOOT  2011   CATARACT EXTRACTION W/ INTRAOCULAR LENS  IMPLANT, BILATERAL     COLONOSCOPY     KNEE ARTHROSCOPY W/ MENISCECTOMY Bilateral X2  LEFT /    X1  RIGHT   KNEE OPEN LATERAL RELEASE Bilateral    MOHS SURGERY Left 12/15/2017   Melanoma in situ - left calf - [facility]   ORIF ANKLE FRACTURE Right 08/04/2020   Procedure: OPEN REDUCTION INTERNAL FIXATION (ORIF) ANKLE FRACTURE;  Surgeon: Wylene Simmer, MD;  Location: [facility];  Service: Orthopedics;  Laterality: Right;  Mini C-arm, [employer] Small Frag   REPLACEMENT TOTAL KNEE Left 2006   SHOULDER ARTHROSCOPY WITH SUBACROMIAL DECOMPRESSION, ROTATOR CUFF REPAIR AND BICEP TENDON REPAIR Right 05/23/2013   Procedure: RIGHT SHOULDER ARTHROSCOPY EXAM UNDER ANESTHESIA  WITH SUBACROMIAL DECOMPRESSION,DISTAL CLAVICLE RESECTION, SADLABRAL DEBRIDEMENT CHONDROPLASTY, BICEP TENOTOMY ;  Surgeon: Sydnee Cabal, MD;  Location: [facility];  Service: Orthopedics;  Laterality: Right;   TONSILLECTOMY AND ADENOIDECTOMY  AGE 66   TOTAL HIP ARTHROPLASTY Left 05/04/2017   Procedure: LEFT TOTAL HIP ARTHROPLASTY ANTERIOR APPROACH;  Surgeon: Paralee Cancel, MD;  Location: [facility];  Service: Orthopedics;  Laterality: Left;   TOTAL HIP ARTHROPLASTY Right 08/01/2019   Procedure: TOTAL HIP ARTHROPLASTY ANTERIOR APPROACH;  Surgeon: Paralee Cancel, MD;  Location: [facility];  Service: Orthopedics;  Laterality: Right;  26mins   TOTAL KNEE ARTHROPLASTY Bilateral LEFT  1996/   RIGHT 2004   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     Antiseborrheic Products, Misc. (PROMISEB) CREA Apply 1 tablet topically daily.     apixaban (ELIQUIS) 5 MG TABS tablet Take 1 tablet (5 mg total) by mouth 2 (two) times daily. 180 tablet 1   atorvastatin (LIPITOR) 20 MG tablet Take 1 tablet (20 mg total) by mouth daily. 90 tablet 3   betamethasone dipropionate 0.05 % lotion APPLY TOPICALLY TO THE AFFECTED AREA DAILY 60 mL 2   escitalopram (LEXAPRO) 10 MG tablet TAKE 1 AND 1/2 TABLETS(15 MG) BY MOUTH DAILY 135 tablet 0   famotidine (PEPCID) 20 MG tablet Take 1 tablet (20 mg total) by mouth daily as needed for heartburn or indigestion. (Patient taking differently: Take 20 mg by mouth daily.) 90 tablet 3   FIBER PO Take 1 capsule by mouth in the morning and at bedtime.     HYDROcodone-acetaminophen (NORCO/VICODIN) 5-325 MG tablet Take 1 tablet by mouth every 6 (six) hours as needed for moderate pain.     hydrocortisone cream 1 % Apply 1 application topically daily as needed for itching.     L-Methylfolate-B12-B6-B2 (CEREFOLIN) 07-17-48-5 MG TABS Take 1 tablet by mouth daily.     losartan (COZAAR) 100 MG tablet Take 100 mg by mouth daily.     metoprolol tartrate (LOPRESSOR) 25 MG tablet Take 0.5 tablets (12.5 mg total) by mouth 2 (two) times daily. 60 tablet 3   sulfamethoxazole-trimethoprim (BACTRIM,SEPTRA) 400-80 MG tablet Take 1 tablet by mouth daily. For urethritis.     SUMAtriptan (IMITREX) 100 MG tablet TAKE 1 TABLET BY MOUTH AS NEEDED FOR MIGRAINE. MAY REPEAT DOSE IN 2 HOURS IF HEADACHE PERSISTS OR RECURS 10 tablet 0   traZODone (DESYREL) 50 MG tablet TAKE 1 TABLET(50 MG) BY MOUTH AT BEDTIME 90 tablet 1   Current Facility-Administered Medications  Medication Dose Route Frequency Provider Last Rate Last Admin   0.9 %  sodium chloride infusion  500 mL Intravenous Once Armbruster, [name], MD        Allergies   Allergen Reactions   [place] Nail [Apremilast] Anaphylaxis    Suicidal ideation   Penicillins Anaphylaxis    Has patient had a PCN reaction causing immediate rash, facial/tongue/throat swelling, SOB or lightheadedness with hypotension: Yes Has patient had a PCN reaction causing severe rash involving mucus membranes or skin necrosis: Yes Has patient had a PCN reaction that required hospitalization: No Has patient had a PCN reaction occurring within the last 10 year No If all of the above answers are "NO", then may proceed with Cephalosporin use.    Erythromycin Other (See Comments)  SEVERE STOMACH CRAMPS   Morphine And Related Nausea And Vomiting   Nsaids Other (See Comments)    SEVERE STOMACH CRAMPS, MOUTH SORES **Able to tolerate Tylenol   Remicade [Infliximab] Other (See Comments)    Shut down immune system-BP high   Tolmetin     SEVERE STOMACH CRAMPS   Nickel Rash    Including snaps on hospital gowns     Social History   Socioeconomic History   Marital status: Widowed    Spouse name: Not on file   Number of children: Not on file   Years of education: Not on file   Highest education level: Master's degree (e.g., MA, MS, MEng, MEd, MSW, MBA)  Occupational History   Not on file  Tobacco Use   Smoking status: Former    Packs/day: 0.50    Years: 15.00    Pack years: 7.50    Types: Cigarettes    Quit date: 05/15/1985    Years since quitting: 35.7   Smokeless tobacco: Never  Vaping Use   Vaping Use: Never used  Substance and Sexual Activity   Alcohol use: No    Comment: RECOVERING ALCOHOLIC--  QUIT [id number]   Drug use: No   Sexual activity: Never    Birth control/protection: Post-menopausal  Other Topics Concern   Not on file  Social History Narrative   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    Widowed    Three kids    57 grandchildren       Social Determinants of Radio broadcast assistant Strain: Low Risk     Difficulty of Paying Living Expenses: Not hard at all  Food Insecurity: No Food Insecurity   Worried About Charity fundraiser in the Last Year: Never true   Arboriculturist in the Last Year: Never true  Transportation Needs: No Transportation Needs   Lack of Transportation (Medical): No   Lack of Transportation (Non-Medical): No  Physical Activity: Insufficiently Active   Days of Exercise per Week: 7 days   Minutes of Exercise per Session: 20 min  Stress: No Stress Concern Present   Feeling of Stress : Only a little  Social Connections: Moderately Isolated   Frequency of Communication with Friends and Family: More than three times a week   Frequency of Social Gatherings with Friends and Family: More than three times a week   Attends Religious Services: Never   Marine scientist or Organizations: Yes   Attends Archivist Meetings: 1 to 4 times per year   Marital Status: Widowed  Human resources officer Violence: Not At Risk   Fear of Current or Ex-Partner: No   Emotionally Abused: No   Physically Abused: No   Sexually Abused: No    Family History  Problem Relation Age of Onset   Melanoma Mother 60   Heart disease Father 20   Hypertension Father    Esophageal cancer Brother    Dementia Paternal Grandfather    Melanoma Niece 53   Colon cancer Neg Hx    Colon polyps Neg Hx    Stomach cancer Neg Hx    Rectal cancer Neg Hx     ROS- All systems are reviewed and negative except as per the HPI above  Physical Exam: Vitals:   02/06/21 1508  BP: (!) 144/90  Pulse: 89  Weight: 101.2 kg  Height: 5\' 6"  (1.676 m)   Wt Readings from Last 3 Encounters:  02/06/21 101.2 kg  01/15/21 102.3 kg  12/25/20 101.8 kg    Labs: Lab Results  Component Value Date   NA 138 01/15/2021   K 3.7 01/15/2021   CL 104 01/15/2021   CO2 27 01/15/2021   GLUCOSE 91 01/15/2021   BUN 11 01/15/2021   CREATININE 0.65 01/15/2021   CALCIUM 9.2 01/15/2021   Lab Results  Component Value  Date   INR 1.0 07/12/2007   Lab Results  Component Value Date   CHOL 177 04/23/2020   HDL 69.40 04/23/2020   LDLCALC 92 04/23/2020   TRIG 76.0 04/23/2020     GEN- The patient is well appearing, alert and oriented x 3 today.   Head- normocephalic, atraumatic Eyes-  Sclera clear, conjunctiva pink Ears- hearing intact Oropharynx- clear Neck- supple, no JVP Lymph- no cervical lymphadenopathy Lungs- Clear to ausculation bilaterally, normal work of breathing Heart- Regular rate and rhythm, no murmurs, rubs or gallops, PMI not laterally displaced GI- soft, NT, ND, + BS Extremities- no clubbing, cyanosis, or edema MS- no significant deformity or atrophy Skin- no rash or lesion Psych- euthymic mood, full affect Neuro- strength and sensation are intact  EKG-NSR at 89 bpm, pr int 158 ms, qrs int 92 ms, qtc 455 ms   Zio patch reviewed   Assessment and Plan:  1. Paroxysmal afib  Pt notices short erratic heart rate episodes during the day  General education re afib  Will start metoprolol tartrate 25 mg 1/2 tab bid  This will help reduce afib or blunt v response when in afib and may be helpful with upcoming surgery to keep rhythm regular   2. CHA2DS2VASc  score of 4 Continue eliquis 5 mg bid  She has already received instructions re stopping drug with upcoming breast surgery 02/20/21   F/u here end of January    Elvis Boot C. Kewon Statler, [facility] [address] [place], [place] [address] [phone]

## 2021-02-11 ENCOUNTER — Other Ambulatory Visit (HOSPITAL_COMMUNITY): Payer: Self-pay | Admitting: Psychiatry

## 2021-02-11 NOTE — Pre-Procedure Instructions (Signed)
Surgical Instructions    Your procedure is scheduled on Thursday 02/20/21.   Report to [facility] Main Entrance "A" at 07:00 A.M., then check in with the Admitting office.  Call this number if you have problems the morning of surgery:  [phone]   If you have any questions prior to your surgery date call [phone]: Open Monday-Friday 8am-4pm    Remember:  Do not eat after midnight the night before your surgery  You may drink clear liquids until 06:00 A.M. the morning of your surgery.   Clear liquids allowed are: Water, Non-Citrus Juices (without pulp), Carbonated Beverages, Clear Tea, Black Coffee ONLY (NO MILK, CREAM OR POWDERED CREAMER of any kind), and Gatorade    Take these medicines the morning of surgery with A SIP OF WATER   atorvastatin (LIPITOR)  escitalopram (LEXAPRO)  famotidine (PEPCID)  metoprolol tartrate (LOPRESSOR)   sulfamethoxazole-trimethoprim (BACTRIM,SEPTRA)    Take these medicines if needed:   acetaminophen (TYLENOL)  HYDROcodone-acetaminophen (NORCO/VICODIN)   As of today, STOP taking any Aspirin (unless otherwise instructed by your surgeon) Aleve, Naproxen, Ibuprofen, Motrin, Advil, Goody's, BC's, all herbal medications, fish oil, and all vitamins.     After your COVID test   You are not required to quarantine however you are required to wear a well-fitting mask when you are out and around people not in your household.  If your mask becomes wet or soiled, replace with a new one.  Wash your hands often with soap and water for 20 seconds or clean your hands with an alcohol-based hand sanitizer that contains at least 60% alcohol.  Do not share personal items.  Notify your provider: if you are in close contact with someone who has COVID  or if you develop a fever of 100.4 or greater, sneezing, cough, sore throat, shortness of breath or body aches.             Do not wear jewelry or makeup Do not wear lotions, powders, perfumes/colognes, or  deodorant. Do not shave 48 hours prior to surgery.  Men may shave face and neck. Do not bring valuables to the hospital. DO Not wear nail polish, gel polish, artificial nails, or any other type of covering on natural nails including finger and toenails. If patients have artificial nails, gel coating, etc. that need to be removed by a nail salon, please have this removed prior to surgery or surgery may need to be canceled/delayed if the surgeon/ anesthesia feels like the patient is unable to be adequately monitored.             [facility] is not responsible for any belongings or valuables.  Do NOT Smoke (Tobacco/Vaping)  24 hours prior to your procedure  If you use a CPAP at night, you may bring your mask for your overnight stay.   Contacts, glasses, hearing aids, dentures or partials may not be worn into surgery, please bring cases for these belongings   For patients admitted to the hospital, discharge time will be determined by your treatment team.   Patients discharged the day of surgery will not be allowed to drive home, and someone needs to stay with them for 24 hours.  NO VISITORS WILL BE ALLOWED IN PRE-OP WHERE PATIENTS ARE PREPPED FOR SURGERY.  ONLY 1 SUPPORT PERSON MAY BE PRESENT IN THE WAITING ROOM WHILE YOU ARE IN SURGERY.  IF YOU ARE TO BE ADMITTED, ONCE YOU ARE IN YOUR ROOM YOU WILL BE ALLOWED TWO (2) VISITORS. 1 (ONE) VISITOR  MAY STAY OVERNIGHT BUT MUST ARRIVE TO THE ROOM BY 8pm.  Minor children may have two parents present. Special consideration for safety and communication needs will be reviewed on a case by case basis.  Special instructions:    Oral Hygiene is also important to reduce your risk of infection.  Remember - BRUSH YOUR TEETH THE MORNING OF SURGERY WITH YOUR REGULAR TOOTHPASTE   [place]- Preparing For Surgery  Before surgery, you can play an important role. Because skin is not sterile, your skin needs to be as free of germs as possible. You can reduce the  number of germs on your skin by washing with CHG (chlorahexidine gluconate) Soap before surgery.  CHG is an antiseptic cleaner which kills germs and bonds with the skin to continue killing germs even after washing.     Please do not use if you have an allergy to CHG or antibacterial soaps. If your skin becomes reddened/irritated stop using the CHG.  Do not shave (including legs and underarms) for at least 48 hours prior to first CHG shower. It is OK to shave your face.  Please follow these instructions carefully.     Shower the NIGHT BEFORE SURGERY and the MORNING OF SURGERY with CHG Soap.   If you chose to wash your hair, wash your hair first as usual with your normal shampoo. After you shampoo, rinse your hair and body thoroughly to remove the shampoo.  Then [employer] and genitals (private parts) with your normal soap and rinse thoroughly to remove soap.  After that Use CHG Soap as you would any other liquid soap. You can apply CHG directly to the skin and wash gently with a scrungie or a clean washcloth.   Apply the CHG Soap to your body ONLY FROM THE NECK DOWN.  Do not use on open wounds or open sores. Avoid contact with your eyes, ears, mouth and genitals (private parts). Wash Face and genitals (private parts)  with your normal soap.   Wash thoroughly, paying special attention to the area where your surgery will be performed.  Thoroughly rinse your body with warm water from the neck down.  DO NOT shower/wash with your normal soap after using and rinsing off the CHG Soap.  Pat yourself dry with a CLEAN TOWEL.  Wear CLEAN PAJAMAS to bed the night before surgery  Place CLEAN SHEETS on your bed the night before your surgery  DO NOT SLEEP WITH PETS.   Day of Surgery:  Take a shower with CHG soap. Wear Clean/Comfortable clothing the morning of surgery Do not apply any deodorants/lotions.   Remember to brush your teeth WITH YOUR REGULAR TOOTHPASTE.   Please read over the following  fact sheets that you were given.

## 2021-02-12 ENCOUNTER — Encounter (HOSPITAL_COMMUNITY): Payer: Self-pay

## 2021-02-12 ENCOUNTER — Encounter (HOSPITAL_COMMUNITY)
Admission: RE | Admit: 2021-02-12 | Discharge: 2021-02-12 | Disposition: A | Discharge status: Home / Self Care | Payer: Medicare Other | Source: Ambulatory Visit | Attending: General Surgery | Admitting: General Surgery

## 2021-02-12 ENCOUNTER — Other Ambulatory Visit: Payer: Self-pay

## 2021-02-12 VITALS — BP 179/85 | HR 51 | Temp 98.2°F | Resp 19 | Ht 66.5 in | Wt 222.0 lb

## 2021-02-12 DIAGNOSIS — G3184 Mild cognitive impairment, so stated: Secondary | ICD-10-CM | POA: Diagnosis not present

## 2021-02-12 DIAGNOSIS — C50912 Malignant neoplasm of unspecified site of left female breast: Secondary | ICD-10-CM | POA: Insufficient documentation

## 2021-02-12 DIAGNOSIS — Z90710 Acquired absence of both cervix and uterus: Secondary | ICD-10-CM | POA: Insufficient documentation

## 2021-02-12 DIAGNOSIS — M1990 Unspecified osteoarthritis, unspecified site: Secondary | ICD-10-CM | POA: Diagnosis not present

## 2021-02-12 DIAGNOSIS — Z86718 Personal history of other venous thrombosis and embolism: Secondary | ICD-10-CM | POA: Insufficient documentation

## 2021-02-12 DIAGNOSIS — D509 Iron deficiency anemia, unspecified: Secondary | ICD-10-CM | POA: Insufficient documentation

## 2021-02-12 DIAGNOSIS — I700 Atherosclerosis of aorta: Secondary | ICD-10-CM | POA: Diagnosis not present

## 2021-02-12 DIAGNOSIS — K449 Diaphragmatic hernia without obstruction or gangrene: Secondary | ICD-10-CM | POA: Diagnosis not present

## 2021-02-12 DIAGNOSIS — R002 Palpitations: Secondary | ICD-10-CM | POA: Diagnosis not present

## 2021-02-12 DIAGNOSIS — E785 Hyperlipidemia, unspecified: Secondary | ICD-10-CM | POA: Diagnosis not present

## 2021-02-12 DIAGNOSIS — I491 Atrial premature depolarization: Secondary | ICD-10-CM | POA: Diagnosis not present

## 2021-02-12 DIAGNOSIS — Z85820 Personal history of malignant melanoma of skin: Secondary | ICD-10-CM | POA: Insufficient documentation

## 2021-02-12 DIAGNOSIS — Z01812 Encounter for preprocedural laboratory examination: Secondary | ICD-10-CM | POA: Diagnosis not present

## 2021-02-12 DIAGNOSIS — Z96653 Presence of artificial knee joint, bilateral: Secondary | ICD-10-CM | POA: Diagnosis not present

## 2021-02-12 DIAGNOSIS — Z87891 Personal history of nicotine dependence: Secondary | ICD-10-CM | POA: Insufficient documentation

## 2021-02-12 DIAGNOSIS — I10 Essential (primary) hypertension: Secondary | ICD-10-CM | POA: Diagnosis not present

## 2021-02-12 DIAGNOSIS — K589 Irritable bowel syndrome without diarrhea: Secondary | ICD-10-CM | POA: Insufficient documentation

## 2021-02-12 DIAGNOSIS — K219 Gastro-esophageal reflux disease without esophagitis: Secondary | ICD-10-CM | POA: Insufficient documentation

## 2021-02-12 DIAGNOSIS — Z7901 Long term (current) use of anticoagulants: Secondary | ICD-10-CM | POA: Diagnosis not present

## 2021-02-12 DIAGNOSIS — F1091 Alcohol use, unspecified, in remission: Secondary | ICD-10-CM | POA: Diagnosis not present

## 2021-02-12 HISTORY — DX: Cardiac arrhythmia, unspecified: I49.9

## 2021-02-12 LAB — BASIC METABOLIC PANEL
Anion gap: 6 (ref 5–15)
BUN: 13 mg/dL (ref 8–23)
CO2: 27 mmol/L (ref 22–32)
Calcium: 9 mg/dL (ref 8.9–10.3)
Chloride: 104 mmol/L (ref 98–111)
Creatinine, Ser: 0.64 mg/dL (ref 0.44–1.00)
GFR, Estimated: >60 mL/min (ref >60)
Glucose, Bld: 96 mg/dL (ref 70–99)
Potassium: 3.6 mmol/L (ref 3.5–5.1)
Sodium: 137 mmol/L (ref 135–145)

## 2021-02-12 LAB — CBC
HCT: 38.6 % (ref 36.0–46.0)
Hemoglobin: 12.6 g/dL (ref 12.0–15.0)
MCH: 33.5 pg (ref 26.0–34.0)
MCHC: 32.6 g/dL (ref 30.0–36.0)
MCV: 102.7 fL — ABNORMAL HIGH (ref 80.0–100.0)
Platelets: 268 10*3/uL (ref 150–400)
RBC: 3.76 MIL/uL — ABNORMAL LOW (ref 3.87–5.11)
RDW: 11.9 % (ref 11.5–15.5)
WBC: 6.9 10*3/uL (ref 4.0–10.5)
nRBC: 0 % (ref 0.0–0.2)

## 2021-02-12 NOTE — Anesthesia Preprocedure Evaluation (Deleted)
Anesthesia Evaluation    Airway        Dental   Pulmonary former smoker,           Cardiovascular hypertension,      Neuro/Psych    GI/Hepatic   Endo/Other    Renal/GU      Musculoskeletal   Abdominal   Peds  Hematology   Anesthesia Other Findings   Reproductive/Obstetrics                             Anesthesia Physical Anesthesia Plan  ASA:   Anesthesia Plan:    Post-op Pain Management:    Induction:   PONV Risk Score and Plan:   Airway Management Planned:   Additional Equipment:   Intra-op Plan:   Post-operative Plan:   Informed Consent:   Plan Discussed with:   Anesthesia Plan Comments: (PAT note written 02/12/2021 by Myra Gianotti, PA-C. )        Anesthesia Quick Evaluation

## 2021-02-12 NOTE — Progress Notes (Signed)
PCP - [name] NP Cardiologist - Dr. Gwenlyn Found  Chest x-ray - Not indicated EKG - 02/06/21 Stress Test - Yes at age 75 at that time no f/u needed ECHO - 12/06/20 Cardiac Cath - Denies  Blood Thinner Instructions: Eliquis stop 2 days prior Aspirin Instructions:Will stop 2 days prior as well  ERAS Protcol - Yes  Anesthesia review:  Yes recent A-fib diagnosis  Patient denies shortness of breath, fever, cough and chest pain at PAT appointment   All instructions explained to the patient, with a verbal understanding of the material. Patient agrees to go over the instructions while at home for a better understanding.  The opportunity to ask questions was provided.

## 2021-02-12 NOTE — Progress Notes (Signed)
Anesthesia Chart Review:  Case: [id number] Date/Time: 02/20/21 0845   Procedure: LEFT BREAST LUMPECTOMY WITH RADIOACTIVE SEED LOCALIZATION (Left: Breast)   Anesthesia type: General   Pre-op diagnosis: LEFT BREAST CANCER   Location: [facility] OR ROOM 02 / [place] [facility]   Surgeons: [name], MD       DISCUSSION: Patient is a 75 year old female scheduled for the above procedure.   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    She was evaluated by cardiologist Dr. Gwenlyn Found on 10/22/20 after she was noted to have frequent PACs, question PAF during 10/17/20 endoscopy. She reported palpitations for the past ~ 6-12 months. TSH in March was normal. A Coronary calcium score, echo, and long term event monitor ordered. Coronary calcium CT showed coronary calcium score of 24 (37th percentile for age, gender, and race matched controls), aortic atherosclerosis, 42 mm ascending aorta, and AV calcium score of 99 with no AS by echo. Echo was unremarkable. Event monitor showed long runs of PAF. Eliquis was started for CHA2DSVASC2 score of 4. She was also started on atorvastatin for coronary and aortic calcifications. He also referred her to Roderic Palau, NP at the [facility], seen on 02/06/21. Metoprolol tartrate 25 mg 1/2 tab BID started due to on-going short erratic HR episodes per her smart watch. She already had preoperative cardiology input outline by Coletta Memos, NP on 01/17/21 indicating patient was felt to be "acceptable risk for the planned procedure without further cardiovascular testing." She was given permission to hold Eliqus for 2 days prior to surgery.    RSL  is scheduled for 02/20/20 at 1:00 PM. Anesthesia team to evaluate on the day of surgery.     VS: BP (!) 179/85    Pulse (!) 51    Temp 36.8 C    Resp 19    Ht 5' 6.5" (1.689 m)    Wt 100.7 kg    SpO2 97%    BMI 35.29 kg/m  BP Readings from Last 3 Encounters:  02/12/21 (!) 179/85  02/06/21 (!) 144/90  01/15/21 (!) 154/75      PROVIDERS: [name], NP is PCP  Quay Burow, MD is cardiologist  [name], MD is Edson Snowball, MD is RAD-ONC Antony Contras, MD is neurologist Jobos Cellar, MD is GI   LABS: Labs reviewed: Acceptable for surgery. LFTs normal on 01/15/21. TSH 1.68 04/23/20. (all labs ordered are listed, but only abnormal results are displayed)  Labs Reviewed  CBC - Abnormal; Notable for the following components:      Result Value   RBC 3.76 (*)    MCV 102.7 (*)    All other components within normal limits  BASIC METABOLIC PANEL     IMAGES: CT Chest 12/06/20: FINDINGS: - The scout radiograph is unremarkable. - No mediastinal mass or adenopathy identified. - No pleural effusion, airspace consolidation or pneumothorax identified. No suspicious pulmonary nodule. - No acute abnormality within the imaged portions of the upper abdomen. - Degenerative disc disease noted within the lumbar spine. No acute or suspicious osseous findings. IMPRESSION: No significant noncardiac supplemental findings.  MRI Brain 10/06/20: IMPRESSION: - No evidence of recent infarction, hemorrhage, or mass. No abnormal enhancement. - Mild to  moderate chronic microvascular ischemic changes.     EKG: 02/06/21: Normal sinus rhythm at 89 bpm Minimal voltage criteria for LVH, may be normal variant ( R in aVL ) Borderline ECG Since last tracing rate faster Confirmed by Quay Burow [id number]) on 02/07/2021 6:27:27 AM   CV: Echo 12/06/20: IMPRESSIONS   1. Left ventricular ejection fraction by 3D volume is 73 %. The left  ventricle has hyperdynamic function. The  left ventricle has no regional  wall motion abnormalities. Left ventricular diastolic parameters are  consistent with Grade I diastolic  dysfunction (impaired relaxation). The average left ventricular global  longitudinal strain is -25.1 %. The global longitudinal strain is normal.   2. Right ventricular systolic function is normal. The right ventricular  size is normal.   3. Left atrial size was mildly dilated.   4. The mitral valve is normal in structure. Mild mitral valve  regurgitation. No evidence of mitral stenosis.   5. The aortic valve is tricuspid. There is mild calcification of the  aortic valve. There is mild thickening of the aortic valve. Aortic valve  regurgitation is not visualized. Mild to moderate aortic valve  sclerosis/calcification is present, without any  evidence of aortic stenosis.   6. The inferior vena cava is normal in size with greater than 50%  respiratory variability, suggesting right atrial pressure of 3 mmHg.    CT Cardiac Scoring 12/06/20: IMPRESSION: 1. Coronary calcium score of 24. This was 37th percentile for age, gender, and race matched controls. 2. Aortic atherosclerosis. 3. Evidence of mild ascending aortic dilation 42 mm on non-contrasted study. Consider secondary imaging modality (echocardiogram, CTA Aorta Protocol, MRA Aorta Protocol) if clinically indicated. 4. Mild aortic valve calcification with calcium score of 99.   RECOMMENDATIONS: The proposed cut-off value of 1,651 AU yielded a 93 % sensitivity and 75 % specificity in grading AS severity in patients with classical low-flow, low-gradient AS. Proposed different cut-off values to define severe AS for men and women as 2,065 AU and 1,274 AU, respectively. The joint European and American recommendations for the assessment of AS consider the aortic valve calcium score as a continuum - a very high calcium score suggests severe AS and a low calcium score suggests severe AS is  unlikely.   Long term event monitor 10/27/20-12/04/20: 1. SR 2. Occasional PVCs and short runs of NSVT 3. Short runs of SVT 4. Longer runs of PAF/PAFl 5. Needs ROV to discuss   Reported a stress test at age 32.   Past Medical History:  Diagnosis Date   Allergic rhinitis    Allergy    Anemia    Anxiety    Blood transfusion without reported diagnosis    Cataract    bil cataracts removed   Clotting disorder ([place])    DVT- 1996 po knee replacement   Colitis    Complication of anesthesia    vomit x1 while on Morphine per pt   Depression    Diverticulosis    Dysrhythmia    Atrial fibrillation   Frequency of urination    GERD (gastroesophageal reflux disease)    History of colon polyps    History of DVT of lower extremity    POST LEFT TOTAL KNEE  1996   History of hiatal hernia    History of iron deficiency anemia 1996   iron infusion   History of migraine headaches    History of rib fracture    Hyperlipidemia    Hypertension    IBS (irritable  bowel syndrome)    Insomnia    MCI (mild cognitive impairment)    Melanoma ([place]) 2019   left leg    Migraine headache    hx of migraines    OA (osteoarthritis)    RIGHT SHOULDER   Osteopenia    Pneumonia    remote history   PONV (postoperative nausea and vomiting)    Recovering alcoholic ([place])    SINCE [id number]   Right rotator cuff tear    Substance abuse ([place])    recovering alcoholic   Unspecified essential hypertension     Past Surgical History:  Procedure Laterality Date   BREAST BIOPSY Left 01/06/2021   pos   BUNIONECTOMY/  HAMMERTOE CORRECTION  RIGHT FOOT  2011   CATARACT EXTRACTION W/ INTRAOCULAR LENS  IMPLANT, BILATERAL     COLONOSCOPY     DILATION AND CURETTAGE OF UTERUS  1976   EYE SURGERY Bilateral 2016   cataract removal   KNEE ARTHROSCOPY W/ MENISCECTOMY Bilateral X2  LEFT /    X1  RIGHT   KNEE OPEN LATERAL RELEASE Bilateral    MOHS SURGERY Left 12/15/2017   Melanoma in situ - [facility]   ORIF ANKLE FRACTURE Right 08/04/2020   Procedure: OPEN REDUCTION INTERNAL FIXATION (ORIF) ANKLE FRACTURE;  Surgeon: Wylene Simmer, MD;  Location: [facility];  Service: Orthopedics;  Laterality: Right;  Mini C-arm, [employer] Small Frag   REPLACEMENT TOTAL KNEE Left 2006   SHOULDER ARTHROSCOPY WITH SUBACROMIAL DECOMPRESSION, ROTATOR CUFF REPAIR AND BICEP TENDON REPAIR Right 05/23/2013   Procedure: RIGHT SHOULDER ARTHROSCOPY EXAM UNDER ANESTHESIA  WITH SUBACROMIAL DECOMPRESSION,DISTAL CLAVICLE RESECTION, SADLABRAL DEBRIDEMENT CHONDROPLASTY, BICEP TENOTOMY ;  Surgeon: Sydnee Cabal, MD;  Location: [place];  Service: Orthopedics;  Laterality: Right;   TONSILLECTOMY AND ADENOIDECTOMY  AGE 45   TOTAL HIP ARTHROPLASTY Left 05/04/2017   Procedure: LEFT TOTAL HIP ARTHROPLASTY ANTERIOR APPROACH;  Surgeon: Paralee Cancel, MD;  Location: [facility];  Service: Orthopedics;  Laterality: Left;   TOTAL HIP ARTHROPLASTY Right 08/01/2019   Procedure: TOTAL HIP ARTHROPLASTY ANTERIOR APPROACH;  Surgeon: Paralee Cancel, MD;  Location: [facility];  Service: Orthopedics;  Laterality: Right;  57mins   TOTAL KNEE ARTHROPLASTY Bilateral LEFT  1996/   RIGHT 2004   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 (PROMISEB) CREA   apixaban (ELIQUIS) 5 MG TABS tablet   atorvastatin (LIPITOR) 20 MG tablet   betamethasone dipropionate 0.05 % lotion   escitalopram (LEXAPRO) 10 MG tablet   famotidine (PEPCID) 20 MG tablet   FIBER PO   HYDROcodone-acetaminophen (NORCO/VICODIN) 5-325 MG tablet   hydrocortisone cream 1 %   L-Methylfolate-B12-B6-B2 (CEREFOLIN) 07-17-48-5 MG TABS   losartan (COZAAR) 100 MG tablet   metoprolol tartrate (LOPRESSOR) 25 MG tablet   sulfamethoxazole-trimethoprim (BACTRIM,SEPTRA) 400-80 MG tablet   SUMAtriptan  (IMITREX) 100 MG tablet   traZODone (DESYREL) 50 MG tablet    0.9 %  sodium chloride infusion    Myra Gianotti, PA-C Surgical Short Stay/Anesthesiology [facility] Phone [phone] [facility] Phone [phone] 02/12/2021 5:30 PM

## 2021-02-18 ENCOUNTER — Other Ambulatory Visit: Payer: Self-pay | Admitting: Adult Health

## 2021-02-18 NOTE — Telephone Encounter (Signed)
Patient needs a refill on Sumatriptan.

## 2021-02-18 NOTE — H&P (Addendum)
REFERRING PHYSICIAN:  Lajean Manes   PROVIDER:  Georgianne Fick, MD   Care Team: Patient Care Team: None as PCP - General Barry Dienes Ballard Russell, MD as Consulting Provider (Surgical Oncology) Truitt Merle, MD (Hematology and Oncology) Blair Promise, MD (Radiation Oncology)    MRN: [id number] DOB: 01/23/1946    Subjective    Chief Complaint: Breast Cancer       History of Present Illness: Tonya Bartlett is a 76 y.o. female who is seen today as an office consultation at the request of Dr. Autumn Patty for evaluation of Breast Cancer .     Pt had short term follow up on left breast calcifications.  These were increased in number and extent, up to 1.1 cm without mass, so core needle biopsy was performed.  This showed intermediate grade DCIS.  Hormone receptors are positive.     Pt has prior melanoma and her mother also had melanoma at age 76 which caused her death.     She had menarche at age 56.  Menopause occurred with hysterectomy. She is a G3P3 with first child at age 76.  She used hormonal contraception for a brief time.  She used HRT for 8 years, but stopped 15 years ago.     Diagnostic mammogram/ultrasound:01/02/2021 [facility] ACR Breast Density Category c: The breast tissue is heterogeneously dense, which may obscure small masses. Is   FINDINGS: The group of calcifications in the upper outer left breast increased in number and overall extent, currently spanning 1.1 x 0.8 x 0.9 cm. There is no so shaded mass or distortion. There is no linearity or branching.   IMPRESSION: 1. Indeterminate calcifications in the upper outer left breast, increased in number and extent since the prior diagnostic study. Tissue sampling is recommended.   RECOMMENDATION: 1. Stereotactic core needle biopsy of the left breast calcifications. This procedure was scheduled prior to the patient being discharged from the breast Center.   I have discussed the findings and recommendations with the  patient. If applicable, a reminder letter will be sent to the patient regarding the next appointment.   BI-RADS CATEGORY  4: Suspicious.   Pathology core needle biopsy: 01/06/21 Breast, left, needle core biopsy, lateral, 2:30 o'clock - DUCTAL CARCINOMA IN SITU WITH NECROSIS AND CALCIFICATIONS Based on the biopsy, the ductal carcinoma in situ has cribriform and comedo-like patterns, intermediate nuclear grade and measures 0.5 cm in greatest linear extent.   Receptors: Estrogen Receptor: 100%, POSITIVE, STRONG STAINING INTENSITY Progesterone Receptor: 70%, POSITIVE, MODERATE STAINING INTENSITY     Review of Systems: A complete review of systems was obtained from the patient.  I have reviewed this information and discussed as appropriate with the patient.  See HPI as well for other ROS.   Review of Systems  Constitutional: Positive for malaise/fatigue.  HENT: Positive for hearing loss.   Cardiovascular: Positive for palpitations.  Gastrointestinal:       Hernia  Genitourinary: Negative.   Musculoskeletal: Positive for back pain, falls and joint pain.  Neurological: Negative.   Psychiatric/Behavioral: Positive for depression.  All other systems reviewed and are negative.       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 (CMS-HCC) 2019   Neck pain     Palpitations     Substance abuse (CMS-HCC)             Patient Active Problem List  Diagnosis   Breast cancer of upper-outer quadrant of left female breast (CMS-HCC)   Essential hypertension, benign   PAF (paroxysmal atrial  fibrillation) (CMS-HCC)      Past Surgical History       Past Surgical History:  Procedure Laterality Date   ARTHROPLASTY HIP TOTAL Right 08/01/2019   ARTHROPLASTY HIP TOTAL Left 05/04/2017   ARTHROSCOPY SHOULDER Right 05/23/2013   ORIF ANKLE FRACTURE Right 08/04/2020        Allergies      Allergies  Allergen Reactions   Erythromycin Abdominal Pain   Nickel Rash   Opioids - Morphine Analogues Vomiting   Penicillins Rash              Current Outpatient Medications on File Prior to Visit  Medication Sig Dispense Refill   apixaban (ELIQUIS) 2.5 mg tablet Take 1 tablet (2.5 mg total) by mouth every 12 (twelve) hours       atorvastatin (LIPITOR) 10 MG tablet Take 1 tablet (10 mg total) by mouth once daily       escitalopram oxalate (LEXAPRO) 10 MG tablet Take 1 tablet (10 mg total) by mouth once daily       famotidine (PEPCID) 10 MG tablet Take 1 tablet (10 mg total) by mouth 2 (two) times daily       losartan-hydrochlorothiazide (HYZAAR) 100-12.5 mg tablet Take 1 tablet by mouth once daily       sulfamethoxazole-trimethoprim (BACTRIM) 400-80 mg tablet Take 1 tablet (80 mg of trimethoprim total) by mouth 2 (two) times daily       traZODone (DESYREL) 100 MG tablet Take by mouth at bedtime        No current facility-administered medications on file prior to visit.      Family History       Family History  Problem Relation Age of Onset   Skin cancer Mother     Obesity Mother     Coronary Artery Disease (Blocked arteries around heart) Father     High blood pressure (Hypertension) Father          Social History        Tobacco Use  Smoking Status Former   Types: Cigarettes   Quit date: 1987   Years since quitting: 35.9  Smokeless Tobacco Never      Social History  Social History         Socioeconomic History   Marital status: Unknown  Tobacco Use   Smoking status: Former      Types: Cigarettes      Quit date: 1987      Years since quitting: 35.9    Smokeless tobacco: Never  Vaping Use   Vaping Use: Never used  Substance and Sexual Activity   Alcohol use: Not Currently   Drug use: Defer   Sexual activity: Defer        Objective:         Vitals:      BP: (!) 154/75  Pulse: 58  Resp: 18  Temp: 36.7 C (98.1 F)  SpO2: 98%  Weight: (!) 102 kg (224 lb  13.9 oz)  Height: 167.6 cm (5\' 6" )    Body mass index is 36.29 kg/m.       Gen:  No acute distress.  Well nourished and well groomed.   Neurological: Alert and oriented to person, place, and time. Coordination normal.  Head: Normocephalic and atraumatic.  Eyes: Conjunctivae are normal. Pupils are equal, round, and reactive to light. No scleral icterus.  Neck: Normal range of motion. Neck supple. No tracheal deviation or thyromegaly present.  Cardiovascular: Normal rate, regular rhythm, normal heart sounds and intact distal pulses.  Exam reveals no gallop and no friction rub.  No murmur heard. Breast: significant bruising lower and outer left breast.  No palpable mass, but + hematoma.  No LAD.  No nipple retraction or nipple discharge.   Respiratory: Effort normal.  No respiratory distress. No chest wall tenderness. Breath sounds normal.  No wheezes, rales or rhonchi.  GI: Soft. Bowel sounds are normal. The abdomen is soft and nontender.  There is no rebound and no guarding.  Musculoskeletal: Normal range of motion. Extremities are nontender.  Lymphadenopathy: No cervical, preauricular, postauricular or axillary adenopathy is present Skin: Skin is warm and dry. No rash noted. No diaphoresis. No erythema. No pallor. No clubbing, cyanosis, or edema.   Psychiatric: Normal mood and affect. Behavior is normal. Judgment and thought content normal.      Labs 01/15/21 Slightly anemia at HCT 35.7 O/w CMET and CBC essentially normal.     Assessment and Plan:    Breast cancer of upper-outer quadrant of left female breast (CMS-HCC) Pt has a new diagnosis of cTis left breast  cancer.   Pt is a candidate for breast conservation.  I recommend seed localized lumpectomy.     The surgical procedure was described to the patient.  I discussed the incision type and location and that we would need radiology involved on with a wire or seed marker and/or sentinel node.       The risks and benefits of the procedure were described to the patient and she wishes to proceed.     We discussed the risks bleeding, infection, damage to other structures, need for further procedures/surgeries.  We discussed the risk of seroma.  The patient was advised if the area in the breast in cancer, we may need to go back to surgery for additional tissue to obtain negative margins or for a lymph node biopsy. The patient was advised that these are the most common complications, but that others can occur as well.  They were advised against taking aspirin or other anti-inflammatory agents/blood thinners the week before surgery.       PAF (paroxysmal atrial fibrillation) (CMS-HCC) I have contacted Dr. Gwenlyn Found to see if she requires additional cardiac testing for risk stratification and to see if she can hold eliquis.

## 2021-02-19 ENCOUNTER — Inpatient Hospital Stay: Admission: RE | Admit: 2021-02-19 | Payer: Medicare Other | Source: Ambulatory Visit

## 2021-02-19 ENCOUNTER — Other Ambulatory Visit: Payer: Self-pay | Admitting: Adult Health

## 2021-02-19 MED ORDER — SUMATRIPTAN SUCCINATE 100 MG PO TABS: ORAL_TABLET | ORAL | 2 refills | Status: DC

## 2021-02-19 MED ORDER — SUMATRIPTAN SUCCINATE 100 MG PO TABS: ORAL_TABLET | ORAL | 0 refills | Status: DC

## 2021-02-19 NOTE — Telephone Encounter (Signed)
Ok to refill 

## 2021-02-20 ENCOUNTER — Other Ambulatory Visit: Payer: Self-pay | Admitting: General Surgery

## 2021-02-20 ENCOUNTER — Inpatient Hospital Stay: Admission: RE | Admit: 2021-02-20 | Payer: Medicare Other | Source: Ambulatory Visit

## 2021-02-20 DIAGNOSIS — C50412 Malignant neoplasm of upper-outer quadrant of left female breast: Secondary | ICD-10-CM

## 2021-02-25 ENCOUNTER — Encounter: Payer: Self-pay | Admitting: *Deleted

## 2021-02-25 ENCOUNTER — Ambulatory Visit
Admission: RE | Admit: 2021-02-25 | Discharge: 2021-02-25 | Disposition: A | Discharge status: Home / Self Care | Payer: Medicare Other | Source: Ambulatory Visit | Attending: General Surgery | Admitting: General Surgery

## 2021-02-25 ENCOUNTER — Other Ambulatory Visit: Payer: Self-pay | Admitting: Adult Health

## 2021-02-25 DIAGNOSIS — Z170 Estrogen receptor positive status [ER+]: Secondary | ICD-10-CM

## 2021-02-25 DIAGNOSIS — D0512 Intraductal carcinoma in situ of left breast: Secondary | ICD-10-CM | POA: Diagnosis not present

## 2021-02-25 DIAGNOSIS — C50412 Malignant neoplasm of upper-outer quadrant of left female breast: Secondary | ICD-10-CM

## 2021-02-27 ENCOUNTER — Ambulatory Visit
Admission: RE | Admit: 2021-02-27 | Discharge: 2021-02-27 | Disposition: A | Discharge status: Home / Self Care | Payer: Medicare Other | Source: Ambulatory Visit | Attending: General Surgery | Admitting: General Surgery

## 2021-02-27 ENCOUNTER — Other Ambulatory Visit: Payer: Self-pay

## 2021-02-27 ENCOUNTER — Ambulatory Visit (HOSPITAL_COMMUNITY): Payer: Medicare Other | Admitting: Anesthesiology

## 2021-02-27 ENCOUNTER — Encounter (HOSPITAL_COMMUNITY): Payer: Self-pay | Admitting: General Surgery

## 2021-02-27 ENCOUNTER — Ambulatory Visit (HOSPITAL_COMMUNITY)
Admission: RE | Admit: 2021-02-27 | Discharge: 2021-02-27 | Disposition: A | Discharge status: Home / Self Care | Payer: Medicare Other | Attending: General Surgery | Admitting: General Surgery

## 2021-02-27 ENCOUNTER — Ambulatory Visit (HOSPITAL_COMMUNITY): Payer: Medicare Other | Admitting: Vascular Surgery

## 2021-02-27 ENCOUNTER — Encounter (HOSPITAL_COMMUNITY)
Admission: RE | Disposition: A | Discharge status: Home / Self Care | Payer: Self-pay | Source: Home / Self Care | Attending: General Surgery

## 2021-02-27 DIAGNOSIS — K219 Gastro-esophageal reflux disease without esophagitis: Secondary | ICD-10-CM | POA: Insufficient documentation

## 2021-02-27 DIAGNOSIS — Z170 Estrogen receptor positive status [ER+]: Secondary | ICD-10-CM | POA: Diagnosis not present

## 2021-02-27 DIAGNOSIS — C50412 Malignant neoplasm of upper-outer quadrant of left female breast: Secondary | ICD-10-CM

## 2021-02-27 DIAGNOSIS — K449 Diaphragmatic hernia without obstruction or gangrene: Secondary | ICD-10-CM | POA: Diagnosis not present

## 2021-02-27 DIAGNOSIS — I10 Essential (primary) hypertension: Secondary | ICD-10-CM | POA: Diagnosis not present

## 2021-02-27 DIAGNOSIS — Z87891 Personal history of nicotine dependence: Secondary | ICD-10-CM | POA: Insufficient documentation

## 2021-02-27 DIAGNOSIS — Z85820 Personal history of malignant melanoma of skin: Secondary | ICD-10-CM | POA: Insufficient documentation

## 2021-02-27 DIAGNOSIS — Z79899 Other long term (current) drug therapy: Secondary | ICD-10-CM | POA: Diagnosis not present

## 2021-02-27 DIAGNOSIS — Z86718 Personal history of other venous thrombosis and embolism: Secondary | ICD-10-CM | POA: Insufficient documentation

## 2021-02-27 DIAGNOSIS — D0512 Intraductal carcinoma in situ of left breast: Secondary | ICD-10-CM | POA: Diagnosis not present

## 2021-02-27 DIAGNOSIS — M1990 Unspecified osteoarthritis, unspecified site: Secondary | ICD-10-CM | POA: Diagnosis not present

## 2021-02-27 DIAGNOSIS — R928 Other abnormal and inconclusive findings on diagnostic imaging of breast: Secondary | ICD-10-CM | POA: Diagnosis not present

## 2021-02-27 HISTORY — PX: BREAST LUMPECTOMY WITH RADIOACTIVE SEED LOCALIZATION: SHX6424

## 2021-02-27 SURGERY — BREAST LUMPECTOMY WITH RADIOACTIVE SEED LOCALIZATION
Anesthesia: Regional | Site: Breast | Laterality: Left

## 2021-02-27 MED ORDER — 0.9 % SODIUM CHLORIDE (POUR BTL) OPTIME
TOPICAL | Status: DC | PRN
  Administered 2021-02-27: 1000 mL

## 2021-02-27 MED ORDER — HYDROCODONE-ACETAMINOPHEN 5-325 MG PO TABS: 1.0000 | ORAL_TABLET | Freq: Four times a day (QID) | ORAL | 0 refills | Status: DC | PRN

## 2021-02-27 MED ORDER — PHENYLEPHRINE 40 MCG/ML (10ML) SYRINGE FOR IV PUSH (FOR BLOOD PRESSURE SUPPORT)
PREFILLED_SYRINGE | INTRAVENOUS | Status: AC
  Filled 2021-02-27: qty 50

## 2021-02-27 MED ORDER — LIDOCAINE 2% (20 MG/ML) 5 ML SYRINGE
INTRAMUSCULAR | Status: DC | PRN
Start: 2021-02-27 — End: 2021-02-27
  Administered 2021-02-27: 100 mg via INTRAVENOUS

## 2021-02-27 MED ORDER — FENTANYL CITRATE (PF) 100 MCG/2ML IJ SOLN
INTRAMUSCULAR | Status: AC
  Filled 2021-02-27: qty 2

## 2021-02-27 MED ORDER — CIPROFLOXACIN IN D5W 400 MG/200ML IV SOLN
400.0000 mg | INTRAVENOUS | Status: AC
  Administered 2021-02-27: 400 mg via INTRAVENOUS
  Filled 2021-02-27: qty 200

## 2021-02-27 MED ORDER — FENTANYL CITRATE (PF) 100 MCG/2ML IJ SOLN
25.0000 ug | INTRAMUSCULAR | Status: DC | PRN
  Administered 2021-02-27: 25 ug via INTRAVENOUS

## 2021-02-27 MED ORDER — ONDANSETRON HCL 4 MG/2ML IJ SOLN
INTRAMUSCULAR | Status: AC
  Filled 2021-02-27: qty 2

## 2021-02-27 MED ORDER — BUPIVACAINE-EPINEPHRINE (PF) 0.25% -1:200000 IJ SOLN
INTRAMUSCULAR | Status: AC
  Filled 2021-02-27: qty 30

## 2021-02-27 MED ORDER — APREPITANT 40 MG PO CAPS
40.0000 mg | ORAL_CAPSULE | Freq: Once | ORAL | Status: AC
  Administered 2021-02-27: 40 mg via ORAL
  Filled 2021-02-27: qty 1

## 2021-02-27 MED ORDER — CHLORHEXIDINE GLUCONATE 0.12 % MT SOLN
15.0000 mL | Freq: Once | OROMUCOSAL | Status: AC
  Administered 2021-02-27: 15 mL via OROMUCOSAL
  Filled 2021-02-27: qty 15

## 2021-02-27 MED ORDER — LIDOCAINE 2% (20 MG/ML) 5 ML SYRINGE
INTRAMUSCULAR | Status: AC
  Filled 2021-02-27: qty 10

## 2021-02-27 MED ORDER — FENTANYL CITRATE (PF) 250 MCG/5ML IJ SOLN
INTRAMUSCULAR | Status: AC
  Filled 2021-02-27: qty 5

## 2021-02-27 MED ORDER — PROPOFOL 10 MG/ML IV BOLUS
INTRAVENOUS | Status: DC | PRN
Start: 2021-02-27 — End: 2021-02-27
  Administered 2021-02-27: 50 mg via INTRAVENOUS
  Administered 2021-02-27: 150 mg via INTRAVENOUS

## 2021-02-27 MED ORDER — EPHEDRINE SULFATE-NACL 50-0.9 MG/10ML-% IV SOSY
PREFILLED_SYRINGE | INTRAVENOUS | Status: DC | PRN
  Administered 2021-02-27 (×3): 5 mg via INTRAVENOUS

## 2021-02-27 MED ORDER — SUCCINYLCHOLINE CHLORIDE 200 MG/10ML IV SOSY
PREFILLED_SYRINGE | INTRAVENOUS | Status: AC
  Filled 2021-02-27: qty 10

## 2021-02-27 MED ORDER — DEXMEDETOMIDINE (PRECEDEX) IN NS 20 MCG/5ML (4 MCG/ML) IV SYRINGE
PREFILLED_SYRINGE | INTRAVENOUS | Status: DC | PRN
  Administered 2021-02-27 (×2): 4 ug via INTRAVENOUS
  Administered 2021-02-27: 8 ug via INTRAVENOUS

## 2021-02-27 MED ORDER — FENTANYL CITRATE (PF) 250 MCG/5ML IJ SOLN
INTRAMUSCULAR | Status: DC | PRN
  Administered 2021-02-27: 100 ug via INTRAVENOUS
  Administered 2021-02-27 (×2): 50 ug via INTRAVENOUS

## 2021-02-27 MED ORDER — GLYCOPYRROLATE 0.2 MG/ML IJ SOLN
INTRAMUSCULAR | Status: DC | PRN
Start: 2021-02-27 — End: 2021-02-27
  Administered 2021-02-27: .2 mg via INTRAVENOUS

## 2021-02-27 MED ORDER — ACETAMINOPHEN 500 MG PO TABS
1000.0000 mg | ORAL_TABLET | ORAL | Status: AC
  Administered 2021-02-27: 1000 mg via ORAL
  Filled 2021-02-27: qty 2

## 2021-02-27 MED ORDER — DEXAMETHASONE SODIUM PHOSPHATE 10 MG/ML IJ SOLN
INTRAMUSCULAR | Status: AC
  Filled 2021-02-27: qty 2

## 2021-02-27 MED ORDER — PHENYLEPHRINE HCL (PRESSORS) 10 MG/ML IV SOLN
INTRAVENOUS | Status: AC
  Filled 2021-02-27: qty 1

## 2021-02-27 MED ORDER — PHENYLEPHRINE HCL-NACL 20-0.9 MG/250ML-% IV SOLN
INTRAVENOUS | Status: DC | PRN
  Administered 2021-02-27: 20 ug/min via INTRAVENOUS

## 2021-02-27 MED ORDER — LACTATED RINGERS IV SOLN: INTRAVENOUS | Status: DC

## 2021-02-27 MED ORDER — DEXAMETHASONE SODIUM PHOSPHATE 10 MG/ML IJ SOLN
INTRAMUSCULAR | Status: DC | PRN
Start: 2021-02-27 — End: 2021-02-27
  Administered 2021-02-27: 10 mg via INTRAVENOUS

## 2021-02-27 MED ORDER — EPHEDRINE 5 MG/ML INJ
INTRAVENOUS | Status: AC
  Filled 2021-02-27: qty 10

## 2021-02-27 MED ORDER — CHLORHEXIDINE GLUCONATE CLOTH 2 % EX PADS: 6.0000 | MEDICATED_PAD | Freq: Once | CUTANEOUS | Status: DC

## 2021-02-27 MED ORDER — ORAL CARE MOUTH RINSE: 15.0000 mL | Freq: Once | OROMUCOSAL | Status: AC

## 2021-02-27 MED ORDER — GLYCOPYRROLATE PF 0.2 MG/ML IJ SOSY
PREFILLED_SYRINGE | INTRAMUSCULAR | Status: AC
  Filled 2021-02-27: qty 1

## 2021-02-27 MED ORDER — ONDANSETRON HCL 4 MG/2ML IJ SOLN
INTRAMUSCULAR | Status: DC | PRN
Start: 2021-02-27 — End: 2021-02-27
  Administered 2021-02-27: 4 mg via INTRAVENOUS

## 2021-02-27 MED ORDER — ROCURONIUM BROMIDE 10 MG/ML (PF) SYRINGE
PREFILLED_SYRINGE | INTRAVENOUS | Status: AC
  Filled 2021-02-27: qty 30

## 2021-02-27 MED ORDER — DEXMEDETOMIDINE (PRECEDEX) IN NS 20 MCG/5ML (4 MCG/ML) IV SYRINGE
PREFILLED_SYRINGE | INTRAVENOUS | Status: AC
  Filled 2021-02-27: qty 20

## 2021-02-27 MED ORDER — LIDOCAINE HCL 1 % IJ SOLN
INTRAMUSCULAR | Status: DC | PRN
  Administered 2021-02-27: 40 mL via INTRAMUSCULAR

## 2021-02-27 MED ORDER — LIDOCAINE HCL (PF) 1 % IJ SOLN
INTRAMUSCULAR | Status: AC
  Filled 2021-02-27: qty 30

## 2021-02-27 MED ORDER — PHENYLEPHRINE 40 MCG/ML (10ML) SYRINGE FOR IV PUSH (FOR BLOOD PRESSURE SUPPORT)
PREFILLED_SYRINGE | INTRAVENOUS | Status: DC | PRN
  Administered 2021-02-27 (×2): 80 ug via INTRAVENOUS
  Administered 2021-02-27 (×2): 40 ug via INTRAVENOUS
  Administered 2021-02-27: 120 ug via INTRAVENOUS

## 2021-02-27 MED ORDER — PROMETHAZINE HCL 25 MG/ML IJ SOLN: 6.2500 mg | INTRAMUSCULAR | Status: DC | PRN

## 2021-02-27 MED ORDER — AMISULPRIDE (ANTIEMETIC) 5 MG/2ML IV SOLN: 10.0000 mg | Freq: Once | INTRAVENOUS | Status: DC | PRN

## 2021-02-27 MED ORDER — ONDANSETRON HCL 4 MG/2ML IJ SOLN
INTRAMUSCULAR | Status: AC
  Filled 2021-02-27: qty 4

## 2021-02-27 MED ORDER — PROPOFOL 10 MG/ML IV BOLUS
INTRAVENOUS | Status: AC
  Filled 2021-02-27: qty 20

## 2021-02-27 SURGICAL SUPPLY — 46 items
ADH SKN CLS APL DERMABOND .7 (GAUZE/BANDAGES/DRESSINGS) ×1
APL PRP STRL LF DISP 70% ISPRP (MISCELLANEOUS) ×1
BAG COUNTER SPONGE SURGICOUNT (BAG) ×2 IMPLANT
BAG SPNG CNTER NS LX DISP (BAG) ×1
BINDER BREAST LRG (GAUZE/BANDAGES/DRESSINGS) IMPLANT
BINDER BREAST XLRG (GAUZE/BANDAGES/DRESSINGS) IMPLANT
BINDER BREAST XXLRG (GAUZE/BANDAGES/DRESSINGS) ×1 IMPLANT
BLADE SURG 10 STRL SS (BLADE) ×2 IMPLANT
CANISTER SUCT 3000ML PPV (MISCELLANEOUS) ×1 IMPLANT
CHLORAPREP W/TINT 26 (MISCELLANEOUS) ×2 IMPLANT
CLIP VESOCCLUDE LG 6/CT (CLIP) ×2 IMPLANT
CLSR STERI-STRIP ANTIMIC 1/2X4 (GAUZE/BANDAGES/DRESSINGS) ×1 IMPLANT
COVER PROBE W GEL 5X96 (DRAPES) ×2 IMPLANT
COVER SURGICAL LIGHT HANDLE (MISCELLANEOUS) ×2 IMPLANT
DERMABOND ADVANCED (GAUZE/BANDAGES/DRESSINGS) ×1
DERMABOND ADVANCED .7 DNX12 (GAUZE/BANDAGES/DRESSINGS) ×1 IMPLANT
DEVICE DUBIN SPECIMEN MAMMOGRA (MISCELLANEOUS) ×2 IMPLANT
DRAPE CHEST BREAST 15X10 FENES (DRAPES) ×2 IMPLANT
DRSG PAD ABDOMINAL 8X10 ST (GAUZE/BANDAGES/DRESSINGS) ×2 IMPLANT
ELECT COATED BLADE 2.86 ST (ELECTRODE) ×2 IMPLANT
ELECT REM PT RETURN 9FT ADLT (ELECTROSURGICAL) ×2
ELECTRODE REM PT RTRN 9FT ADLT (ELECTROSURGICAL) ×1 IMPLANT
GAUZE SPONGE 4X4 12PLY STRL (GAUZE/BANDAGES/DRESSINGS) ×1 IMPLANT
GAUZE SPONGE 4X4 12PLY STRL LF (GAUZE/BANDAGES/DRESSINGS) ×2 IMPLANT
GLOVE SURG ENC MOIS LTX SZ6 (GLOVE) ×2 IMPLANT
GLOVE SURG UNDER LTX SZ6.5 (GLOVE) ×2 IMPLANT
GOWN STRL REUS W/ TWL LRG LVL3 (GOWN DISPOSABLE) ×1 IMPLANT
GOWN STRL REUS W/TWL 2XL LVL3 (GOWN DISPOSABLE) ×2 IMPLANT
GOWN STRL REUS W/TWL LRG LVL3 (GOWN DISPOSABLE) ×2
KIT BASIN OR (CUSTOM PROCEDURE TRAY) ×2 IMPLANT
KIT MARKER MARGIN INK (KITS) ×2 IMPLANT
LIGHT WAVEGUIDE WIDE FLAT (MISCELLANEOUS) IMPLANT
NDL HYPO 25GX1X1/2 BEV (NEEDLE) ×1 IMPLANT
NEEDLE HYPO 25GX1X1/2 BEV (NEEDLE) ×2 IMPLANT
NS IRRIG 1000ML POUR BTL (IV SOLUTION) ×1 IMPLANT
PACK GENERAL/GYN (CUSTOM PROCEDURE TRAY) ×2 IMPLANT
STRIP CLOSURE SKIN 1/2X4 (GAUZE/BANDAGES/DRESSINGS) ×2 IMPLANT
SUT MNCRL AB 4-0 PS2 18 (SUTURE) ×2 IMPLANT
SUT SILK 2 0 SH (SUTURE) IMPLANT
SUT VIC AB 2-0 SH 27 (SUTURE) ×2
SUT VIC AB 2-0 SH 27XBRD (SUTURE) ×1 IMPLANT
SUT VIC AB 3-0 SH 27 (SUTURE) ×2
SUT VIC AB 3-0 SH 27X BRD (SUTURE) ×1 IMPLANT
SYR CONTROL 10ML LL (SYRINGE) ×2 IMPLANT
TOWEL GREEN STERILE (TOWEL DISPOSABLE) ×2 IMPLANT
TOWEL GREEN STERILE FF (TOWEL DISPOSABLE) ×2 IMPLANT

## 2021-02-27 NOTE — Transfer of Care (Signed)
Immediate Anesthesia Transfer of Care Note  Patient: Tonya Bartlett  Procedure(s) Performed: LEFT BREAST LUMPECTOMY WITH RADIOACTIVE SEED LOCALIZATION (Left: Breast)  Patient Location: PACU  Anesthesia Type:General  Level of Consciousness: drowsy  Airway & Oxygen Therapy: Patient Spontanous Breathing and Patient connected to face mask oxygen  Post-op Assessment: Report given to RN and Post -op Vital signs reviewed and stable  Post vital signs: Reviewed and stable  Last Vitals:  Vitals Value Taken Time  BP 153/71 02/27/21 1604  Temp    Pulse    Resp 11 02/27/21 1606  SpO2    Vitals shown include unvalidated device data.  Last Pain:  Vitals:   02/27/21 1205  TempSrc:   PainSc: 0-No pain      Patients Stated Pain Goal: 1 (47/58/30 7460)  Complications: No notable events documented.

## 2021-02-27 NOTE — Anesthesia Preprocedure Evaluation (Addendum)
Anesthesia Evaluation  Patient identified by MRN, date of birth, ID band Patient awake    Reviewed: Allergy & Precautions, NPO status , Patient's Chart, lab work & pertinent test results, reviewed documented beta blocker date and time   History of Anesthesia Complications (+) PONV and history of anesthetic complications  Airway Mallampati: II  TM Distance: <3 FB Neck ROM: Full    Dental  (+) Caps, Loose, Dental Advisory Given All caps:   Pulmonary neg pulmonary ROS, former smoker,    Pulmonary exam normal        Cardiovascular hypertension, Pt. on medications and Pt. on home beta blockers + DVT (in setting of postop)  Normal cardiovascular exam+ dysrhythmias Atrial Fibrillation   Coronary calcium CT showed coronary calcium score of 24 (37th percentile for age, gender, and race matched controls), aortic atherosclerosis, 42 mm ascending aorta, and AV calcium score of 99 with no AS by echo. Echo was unremarkable. Event monitor showed long runs of PAF. Eliquis was started for CHA2DSVASC2 score of 4. She was also started on atorvastatin for coronary and aortic calcifications. He also referred her to Roderic Palau, NP at the [facility], seen on 02/06/21. Metoprolol tartrate 25 mg 1/2 tab BID started due to on-going short erratic HR episodes per her smart watch. She already had preoperative cardiology input outline by Coletta Memos, NP on 01/17/21 indicating patient was felt to be "acceptable risk for the planned procedure without further cardiovascular testing." She was given permission to hold Eliqus for 2 days prior to surgery.     Neuro/Psych  Headaches, PSYCHIATRIC DISORDERS Depression    GI/Hepatic hiatal hernia, GERD  Controlled and Medicated,(+)     substance abuse (remote history)  alcohol use,   Endo/Other  negative endocrine ROS  Renal/GU negative Renal ROS  negative genitourinary   Musculoskeletal  (+) Arthritis ,    Abdominal   Peds  Hematology negative hematology ROS (+)   Anesthesia Other Findings   Reproductive/Obstetrics                           Anesthesia Physical  Anesthesia Plan  ASA: 3  Anesthesia Plan: General and Regional   Post-op Pain Management:  Regional for Post-op pain and Regional block, Celebrex PO (pre-op) and Tylenol PO (pre-op)   Induction: Intravenous  PONV Risk Score and Plan: 4 or greater and Ondansetron, Dexamethasone, Midazolam, Treatment may vary due to age or medical condition and Aprepitant  Airway Management Planned: LMA  Additional Equipment:   Intra-op Plan:   Post-operative Plan: Extubation in OR  Informed Consent: I have reviewed the patients History and Physical, chart, labs and discussed the procedure including the risks, benefits and alternatives for the proposed anesthesia with the patient or authorized representative who has indicated his/her understanding and acceptance.     Dental advisory given  Plan Discussed with: Anesthesiologist and CRNA  Anesthesia Plan Comments:        Anesthesia Quick Evaluation

## 2021-02-27 NOTE — Op Note (Signed)
Left Breast Radioactive seed localized lumpectomy  Indications: This patient presents with history of left breast cancer, upper outer quadrant, cTis receptors +/+  Pre-operative Diagnosis: left breast cancer  Post-operative Diagnosis: Same  Surgeon: [name]   Anesthesia: General endotracheal anesthesia  ASA Class: 3  Procedure Details  The patient was seen in the Holding Room. The risks, benefits, complications, treatment options, and expected outcomes were discussed with the patient. The possibilities of bleeding, infection, the need for additional procedures, failure to diagnose a condition, and creating a complication requiring other procedures or operations were discussed with the patient. The patient concurred with the proposed plan, giving informed consent.  The site of surgery properly noted/marked. The patient was taken to Operating Room # 2, identified, and the procedure verified as left breast seed localized lumpectomy.  The left breast and chest were prepped and draped in standard fashion. A transverse lateral incision was made near the previously placed radioactive seed.  Dissection was carried down around the point of maximum signal intensity. The cautery was used to perform the dissection.   The specimen was inked with the margin marker paint kit.    Specimen radiography confirmed inclusion of the mammographic lesion, the clip, and the seed.  The background signal in the breast was zero.  Hemostasis was achieved with cautery.  The cavity was marked with clips on each border other than the anterior border.  The wound was irrigated and closed with 3-0 vicryl interrupted deep dermal sutures and 4-0 monocryl running subcuticular suture.      Sterile dressings were applied. At the end of the operation, all sponge, instrument, and needle counts were correct.   Findings: Seed, clip in specimen.  anterior margin is skin   Estimated Blood Loss:  min         Specimens: left breast  tissue with seed         Complications:  None; patient tolerated the procedure well.         Disposition: PACU - hemodynamically stable.         Condition: stable

## 2021-02-27 NOTE — Interval H&P Note (Signed)
History and Physical Interval Note:  02/27/2021 2:36 PM  Tonya Bartlett  has presented today for surgery, with the diagnosis of LEFT BREAST CANCER.  The various methods of treatment have been discussed with the patient and family. After consideration of risks, benefits and other options for treatment, the patient has consented to  Procedure(s): LEFT BREAST LUMPECTOMY WITH RADIOACTIVE SEED LOCALIZATION (Left) as a surgical intervention.  The patient's history has been reviewed, patient examined, no change in status, stable for surgery.  I have reviewed the patient's chart and labs.  Questions were answered to the patient's satisfaction.     [name]

## 2021-02-27 NOTE — Anesthesia Postprocedure Evaluation (Addendum)
Anesthesia Post Note  Patient: Tonya Bartlett  Procedure(s) Performed: LEFT BREAST LUMPECTOMY WITH RADIOACTIVE SEED LOCALIZATION (Left: Breast)     Patient location during evaluation: PACU Anesthesia Type: General Level of consciousness: sedated Pain management: pain level controlled Vital Signs Assessment: post-procedure vital signs reviewed and stable Respiratory status: spontaneous breathing and respiratory function stable Cardiovascular status: stable Postop Assessment: no apparent nausea or vomiting Anesthetic complications: no   No notable events documented.  Last Vitals:  Vitals:   02/27/21 1630 02/27/21 1633  BP:  136/75  Pulse: 72 70  Resp: 12 14  Temp:  36.5 C  SpO2: 94% 98%    Last Pain:  Vitals:   02/27/21 1633  TempSrc:   PainSc: 0-No pain                 Makaylin Carlo DANIEL

## 2021-02-27 NOTE — Discharge Instructions (Addendum)
[facility] Office Phone Number [phone]  BREAST BIOPSY/ PARTIAL MASTECTOMY: POST OP INSTRUCTIONS  Always review your discharge instruction sheet given to you by the facility where your surgery was performed.  IF YOU HAVE DISABILITY OR FAMILY LEAVE FORMS, YOU MUST BRING THEM TO THE OFFICE FOR PROCESSING.  DO NOT GIVE THEM TO YOUR DOCTOR.  A prescription for pain medication may be given to you upon discharge.  Take your pain medication as prescribed, if needed.  If narcotic pain medicine is not needed, then you may take acetaminophen (Tylenol) or ibuprofen (Advil) as needed. Take your usually prescribed medications unless otherwise directed If you need a refill on your pain medication, please contact your pharmacy.  They will contact our office to request authorization.  Prescriptions will not be filled after 5pm or on week-ends. You should eat very light the first 24 hours after surgery, such as soup, crackers, pudding, etc.  Resume your normal diet the day after surgery. Most patients will experience some swelling and bruising in the breast.  Ice packs and a good support bra will help.  Swelling and bruising can take several days to resolve.  It is common to experience some constipation if taking pain medication after surgery.  Increasing fluid intake and taking a stool softener will usually help or prevent this problem from occurring.  A mild laxative (Milk of Magnesia or Miralax) should be taken according to package directions if there are no bowel movements after 48 hours. Unless discharge instructions indicate otherwise, you may remove your bandages 48 hours after surgery, and you may shower at that time.  You may have steri-strips (small skin tapes) in place directly over the incision.  These strips should be left on the skin for 7-10 days.   Any sutures or staples will be removed at the office during your follow-up visit. ACTIVITIES:  You may resume regular daily activities  (gradually increasing) beginning the next day.  Wearing a good support bra or sports bra (or the breast binder) minimizes pain and swelling.  You may have sexual intercourse when it is comfortable. You may drive when you no longer are taking prescription pain medication, you can comfortably wear a seatbelt, and you can safely maneuver your car and apply brakes. RETURN TO WORK:  __________1 week_______________ You should see your doctor in the office for a follow-up appointment approximately two weeks after your surgery.  Your doctor's nurse will typically make your follow-up appointment when she calls you with your pathology report.  Expect your pathology report 2-3 business days after your surgery.  You may call to check if you do not hear from us after three days.   WHEN TO CALL YOUR DOCTOR: Fever over 101.0 Nausea and/or vomiting. Extreme swelling or bruising. Continued bleeding from incision. Increased pain, redness, or drainage from the incision.  The clinic staff is available to answer your questions during regular business hours.  Please don't hesitate to call and ask to speak to one of the nurses for clinical concerns.  If you have a medical emergency, go to the nearest emergency room or call 911.  A surgeon from [facility] is always on call at the hospital.  For further questions, please visit [email or web address]   

## 2021-02-27 NOTE — Anesthesia Procedure Notes (Signed)
Procedure Name: LMA Insertion Date/Time: 02/27/2021 2:57 PM Performed by: Vonna Drafts, CRNA Pre-anesthesia Checklist: Patient identified, Emergency Drugs available, Suction available, Patient being monitored and Timeout performed Patient Re-evaluated:Patient Re-evaluated prior to induction Oxygen Delivery Method: Circle system utilized Preoxygenation: Pre-oxygenation with 100% oxygen Induction Type: IV induction Ventilation: Mask ventilation without difficulty LMA: LMA inserted LMA Size: 4.0 Number of attempts: 1 Tube secured with: Tape Dental Injury: Teeth and Oropharynx as per pre-operative assessment

## 2021-02-28 ENCOUNTER — Encounter (HOSPITAL_COMMUNITY): Payer: Self-pay | Admitting: General Surgery

## 2021-03-03 ENCOUNTER — Inpatient Hospital Stay: Payer: Medicare Other | Admitting: Hematology and Oncology

## 2021-03-03 LAB — SURGICAL PATHOLOGY

## 2021-03-04 ENCOUNTER — Other Ambulatory Visit: Payer: Self-pay | Admitting: Neurology

## 2021-03-06 ENCOUNTER — Other Ambulatory Visit: Payer: Self-pay | Admitting: Adult Health

## 2021-03-06 ENCOUNTER — Encounter: Payer: Self-pay | Admitting: *Deleted

## 2021-03-06 MED ORDER — RIZATRIPTAN BENZOATE 10 MG PO TABS: 10.0000 mg | ORAL_TABLET | ORAL | 2 refills | Status: DC | PRN

## 2021-03-10 NOTE — Progress Notes (Signed)
Patient Care Team: [name], NP as PCP - General (Family Medicine) [name], MD as PCP - Cardiology (Cardiology) [name], MD (Inactive) as Consulting Physician (Urology) [facility], [facility] (Specialist) [name], [facility] as Pharmacist (Pharmacist) [name], RN as Oncology Nurse Navigator [name], RN as Oncology Nurse Navigator [name], MD as Consulting Physician (General Surgery) [name], MD as Consulting Physician (Hematology and Oncology) [name], MD as Consulting Physician (Radiation Oncology)  DIAGNOSIS:    ICD-10-CM   1. Ductal carcinoma in situ (DCIS) of left breast  D05.12       SUMMARY OF ONCOLOGIC HISTORY: Oncology History  Ductal carcinoma in situ (DCIS) of left breast  01/06/2021 Initial Diagnosis   Diagnostic mammogram: indeterminate calcifications in the upper outer left breast. Biopsy: DCIS and necrosis and calcifications, ER+(100%)/PR+(70%).    01/15/2021 Cancer Staging   Staging form: Breast, AJCC 8th Edition - Clinical stage from 01/15/2021: Stage 0 (cTis (DCIS), cN0, cM0, ER+, PR+, HER2: Not Assessed) - Signed by [name], MD on 01/15/2021 Stage prefix: Initial diagnosis     Genetic Testing   [facility] CancerNext-Expanded Panel is Negative. Report date is 01/28/2021.  The CancerNext-Expanded gene panel offered by [facility] and includes sequencing, rearrangement, and RNA analysis for the following 77 genes: AIP, ALK, APC, ATM, AXIN2, BAP1, BARD1, BLM, BMPR1A, BRCA1, BRCA2, BRIP1, CDC73, CDH1, CDK4, CDKN1B, CDKN2A, CHEK2, CTNNA1, DICER1, FANCC, FH, FLCN, GALNT12, KIF1B, LZTR1, MAX, MEN1, MET, MLH1, MSH2, MSH3, MSH6, MUTYH, NBN, NF1, NF2, NTHL1, PALB2, PHOX2B, PMS2, POT1, PRKAR1A, PTCH1, PTEN, RAD51C, RAD51D, RB1, RECQL, RET, SDHA, SDHAF2, SDHB, SDHC, SDHD, SMAD4, SMARCA4, SMARCB1, SMARCE1, STK11, SUFU, TMEM127, TP53, TSC1, TSC2, VHL and XRCC2 (sequencing and deletion/duplication); EGFR, EGLN1, HOXB13,  KIT, MITF, PDGFRA, POLD1, and POLE (sequencing only); EPCAM and GREM1 (deletion/duplication only).      CHIEF COMPLIANT: Follow-up of left breast DCIS  INTERVAL HISTORY: Tonya Bartlett is a 76 y.o. with above-mentioned history of left breast DCIS. Left lumpectomy on 02/27/2021 showed DCIS with tumor approaching to 0.5 cm of closest (deep) resection margin. She presents to the clinic today for follow-up.  She is healing and recovering very well from the recent surgery.  ALLERGIES:  is allergic to otezla [apremilast], penicillins, erythromycin, morphine and related, nsaids, remicade [infliximab], tolmetin, and nickel.  MEDICATIONS:  Current Outpatient Medications  Medication Sig Dispense Refill   acetaminophen (TYLENOL) 325 MG tablet Take 650 mg by mouth every 6 (six) hours as needed for moderate pain.     Antiseborrheic Products, Misc. (PROMISEB) CREA Apply 1 tablet topically daily.     apixaban (ELIQUIS) 5 MG TABS tablet Take 1 tablet (5 mg total) by mouth 2 (two) times daily. 180 tablet 1   atorvastatin (LIPITOR) 20 MG tablet Take 1 tablet (20 mg total) by mouth daily. 90 tablet 3   betamethasone dipropionate 0.05 % lotion APPLY TOPICALLY TO THE AFFECTED AREA DAILY 60 mL 2   escitalopram (LEXAPRO) 10 MG tablet TAKE 1 AND 1/2 TABLETS(15 MG) BY MOUTH DAILY 135 tablet 0   famotidine (PEPCID) 20 MG tablet Take 1 tablet (20 mg total) by mouth daily as needed for heartburn or indigestion. (Patient taking differently: Take 20 mg by mouth daily.) 90 tablet 3   FIBER PO Take 1 capsule by mouth in the morning and at bedtime.     hydrocortisone cream 1 % Apply 1 application topically daily as needed for itching.     L-Methylfolate-B12-B6-B2 (CEREFOLIN) 07-17-48-5 MG TABS TAKE 1  TABLET BY MOUTH EVERY DAY 90 tablet 0   losartan (COZAAR) 100 MG tablet Take 100 mg by mouth daily.     metoprolol tartrate (LOPRESSOR) 25 MG tablet Take 0.5 tablets (12.5 mg total) by mouth 2 (two) times daily. 60 tablet 3    rizatriptan (MAXALT) 10 MG tablet Take 1 tablet (10 mg total) by mouth as needed for migraine. May repeat in 2 hours if needed 10 tablet 2   sulfamethoxazole-trimethoprim (BACTRIM,SEPTRA) 400-80 MG tablet Take 1 tablet by mouth daily. For urethritis.     traZODone (DESYREL) 50 MG tablet TAKE 1 TABLET(50 MG) BY MOUTH AT BEDTIME 90 tablet 1   No current facility-administered medications for this visit.    PHYSICAL EXAMINATION: ECOG PERFORMANCE STATUS: 1 - Symptomatic but completely ambulatory  Vitals:   03/11/21 1044  BP: (!) 181/64  Pulse: (!) 53  Resp: 18  Temp: 98 F (36.7 C)  SpO2: 97%   Filed Weights   03/11/21 1044  Weight: 225 lb 11.2 oz (102.4 kg)      LABORATORY DATA:  I have reviewed the data as listed CMP Latest Ref Rng & Units 02/12/2021 01/15/2021 08/05/2020  Glucose 70 - 99 mg/dL 96 91 110(H)  BUN 8 - 23 mg/dL _0 Creatinine 0.44 - 1.00 mg/dL 0.64 0.65 0.59  Sodium 135 - 145 mmol/L 137 138 139  Potassium 3.5 - 5.1 mmol/L 3.6 3.7 3.6  Chloride 98 - 111 mmol/L 104 104 105  CO2 22 - 32 mmol/L _1 Calcium 8.9 - 10.3 mg/dL 9.0 9.2 8.9  Total Protein 6.5 - 8.1 g/dL - 7.4 -  Total Bilirubin 0.3 - 1.2 mg/dL - 0.9 -  Alkaline Phos 38 - 126 U/L - 68 -  AST 15 - 41 U/L - 22 -  ALT 0 - 44 U/L - 19 -    Lab Results  Component Value Date   WBC 6.9 02/12/2021   HGB 12.6 02/12/2021   HCT 38.6 02/12/2021   MCV 102.7 (H) 02/12/2021   PLT 268 02/12/2021   NEUTROABS 2.9 01/15/2021    ASSESSMENT & PLAN:  Ductal carcinoma in situ (DCIS) of left breast 01/06/2021:Diagnostic mammogram: indeterminate calcifications in the upper outer left breast. Biopsy: DCIS and necrosis and calcifications, ER+(100%)/PR+(70%).   02/27/2021: Left lumpectomy: Grade 2 DCIS 0.8 cm, margins negative, ER 100%, PR 70%  Pathology counseling: I discussed the final pathology report of the patient provided  a copy of this report. I discussed the margins.  We also discussed the final  staging along with previously performed ER/PR testing.  Treatment plan: 1. adjuvant radiation therapy 2. Followed by antiestrogen therapy with tamoxifen 5 years We may start her at 5 mg because of her high sensitivity to medications. She talked about switching her antidepressant.  I discussed with her about switching to Effexor instead.  Return to clinic at the end of radiation to start tamoxifen    No orders of the defined types were placed in this encounter.  The patient has a good understanding of the overall plan. she agrees with it. she will call with any problems that may develop before the next visit here.  Total time spent: 20 mins including face to face time and time spent for planning, charting and coordination of care  Rulon Eisenmenger, MD, MPH 03/11/2021  I, Thana Ates, am acting as scribe for Dr. [name].  I have reviewed the above documentation for accuracy and completeness, and I agree  with the above.

## 2021-03-11 ENCOUNTER — Inpatient Hospital Stay: Payer: Medicare Other | Attending: Hematology and Oncology | Admitting: Hematology and Oncology

## 2021-03-11 ENCOUNTER — Other Ambulatory Visit: Payer: Self-pay

## 2021-03-11 DIAGNOSIS — Z170 Estrogen receptor positive status [ER+]: Secondary | ICD-10-CM | POA: Insufficient documentation

## 2021-03-11 DIAGNOSIS — Z79899 Other long term (current) drug therapy: Secondary | ICD-10-CM | POA: Insufficient documentation

## 2021-03-11 DIAGNOSIS — D0512 Intraductal carcinoma in situ of left breast: Secondary | ICD-10-CM | POA: Insufficient documentation

## 2021-03-11 NOTE — Assessment & Plan Note (Signed)
01/06/2021:Diagnostic mammogram: indeterminate calcifications in the upper outer left breast. Biopsy: DCIS and necrosis and calcifications, ER+(100%)/PR+(70%).   02/27/2021: Left lumpectomy: Grade 2 DCIS 0.8 cm, margins negative, ER 100%, PR 70%  Pathology counseling: I discussed the final pathology report of the patient provided  a copy of this report. I discussed the margins.  We also discussed the final staging along with previously performed ER/PR testing.  Treatment plan: 1. adjuvant radiation therapy 2. Followed by antiestrogen therapy with tamoxifen 5 years  Return to clinic at the end of radiation to start tamoxifen

## 2021-03-13 ENCOUNTER — Ambulatory Visit (HOSPITAL_COMMUNITY)
Admission: RE | Admit: 2021-03-13 | Discharge: 2021-03-13 | Disposition: A | Discharge status: Home / Self Care | Payer: Medicare Other | Source: Ambulatory Visit | Attending: Nurse Practitioner | Admitting: Nurse Practitioner

## 2021-03-13 ENCOUNTER — Encounter (HOSPITAL_COMMUNITY): Payer: Self-pay | Admitting: Nurse Practitioner

## 2021-03-13 ENCOUNTER — Other Ambulatory Visit: Payer: Self-pay

## 2021-03-13 VITALS — BP 164/80 | HR 51 | Ht 66.5 in | Wt 226.0 lb

## 2021-03-13 DIAGNOSIS — C50912 Malignant neoplasm of unspecified site of left female breast: Secondary | ICD-10-CM | POA: Insufficient documentation

## 2021-03-13 DIAGNOSIS — R001 Bradycardia, unspecified: Secondary | ICD-10-CM | POA: Diagnosis not present

## 2021-03-13 DIAGNOSIS — Z79899 Other long term (current) drug therapy: Secondary | ICD-10-CM | POA: Insufficient documentation

## 2021-03-13 DIAGNOSIS — D6869 Other thrombophilia: Secondary | ICD-10-CM

## 2021-03-13 DIAGNOSIS — Z7901 Long term (current) use of anticoagulants: Secondary | ICD-10-CM | POA: Diagnosis not present

## 2021-03-13 DIAGNOSIS — I10 Essential (primary) hypertension: Secondary | ICD-10-CM | POA: Diagnosis not present

## 2021-03-13 DIAGNOSIS — I480 Paroxysmal atrial fibrillation: Secondary | ICD-10-CM | POA: Diagnosis not present

## 2021-03-13 DIAGNOSIS — Z8249 Family history of ischemic heart disease and other diseases of the circulatory system: Secondary | ICD-10-CM | POA: Diagnosis not present

## 2021-03-13 NOTE — Progress Notes (Signed)
Primary Care Physician: Tonya Peng, NP Referring Physician:Dr. Vivia [place] Bartlett is a 76 y.o. female with a h/o HTN, newly dx afib in the afib clinic for evaluation. She reports that with her apple watch she can see jumps in HR when she is just sitting. These episodes have not been sustained but several times a day. She is reporting fatigue. She is pending surgery the first of the year for breast cancer. Her apple watch does not run ekg's but she is going to update her watch soon to be able to do this. She was started on eliquis 5 mg bid for a CHA2DS2VASc score of at least 4. No tobacco, alcohol or excessive caffeine.   F/u in afib clinic, 03/13/21. She is doing much better with addition of  low dose BB. She has not noted any further heart racing. She was able to get her lumpectomy and get thru surgery without any rhythm issues. No issues with anticoagulation.   Today, she denies symptoms of palpitations, chest pain, shortness of breath, orthopnea, PND, lower extremity edema, dizziness, presyncope, syncope, or neurologic sequela. The patient is tolerating medications without difficulties and is otherwise without complaint today.   Past Medical History:  Diagnosis Date   Allergic rhinitis    Allergy    Anemia    Anxiety    Blood transfusion without reported diagnosis    Cataract    bil cataracts removed   Clotting disorder ([place])    DVT- 1996 po knee replacement   Colitis    Complication of anesthesia    vomit x1 while on Morphine per pt   Depression    Diverticulosis    Dysrhythmia    Atrial fibrillation   Frequency of urination    GERD (gastroesophageal reflux disease)    History of colon polyps    History of DVT of lower extremity    POST LEFT TOTAL KNEE  1996   History of hiatal hernia    History of iron deficiency anemia 1996   iron infusion   History of migraine headaches    History of rib fracture    Hyperlipidemia    Hypertension    IBS (irritable bowel  syndrome)    Insomnia    MCI (mild cognitive impairment)    Melanoma ([place]) 2019   left leg    Migraine headache    hx of migraines    OA (osteoarthritis)    RIGHT SHOULDER   Osteopenia    Pneumonia    remote history   PONV (postoperative nausea and vomiting)    Recovering alcoholic ([place])    SINCE [id number]   Right rotator cuff tear    Substance abuse ([place])    recovering alcoholic   Unspecified essential hypertension    Past Surgical History:  Procedure Laterality Date   BREAST BIOPSY Left 01/06/2021   pos   BREAST LUMPECTOMY WITH RADIOACTIVE SEED LOCALIZATION Left 02/27/2021   Procedure: LEFT BREAST LUMPECTOMY WITH RADIOACTIVE SEED LOCALIZATION;  Surgeon: Tonya Klein, MD;  Location: [facility];  Service: General;  Laterality: Left;   BUNIONECTOMY/  HAMMERTOE CORRECTION  RIGHT FOOT  2011   CATARACT EXTRACTION W/ INTRAOCULAR LENS  IMPLANT, BILATERAL     COLONOSCOPY     DILATION AND CURETTAGE OF UTERUS  1976   EYE SURGERY Bilateral 2016   cataract removal   KNEE ARTHROSCOPY W/ MENISCECTOMY Bilateral X2  LEFT /    X1  RIGHT   KNEE OPEN LATERAL RELEASE Bilateral  MOHS SURGERY Left 12/15/2017   [facility] in situ - [facility]   ORIF ANKLE FRACTURE Right 08/04/2020   Procedure: OPEN REDUCTION INTERNAL FIXATION (ORIF) ANKLE FRACTURE;  Surgeon: Tonya Simmer, MD;  Location: [facility];  Service: Orthopedics;  Laterality: Right;  Mini C-arm, [employer] Small Frag   REPLACEMENT TOTAL KNEE Left 2006   SHOULDER ARTHROSCOPY WITH SUBACROMIAL DECOMPRESSION, ROTATOR CUFF REPAIR AND BICEP TENDON REPAIR Right 05/23/2013   Procedure: RIGHT SHOULDER ARTHROSCOPY EXAM UNDER ANESTHESIA  WITH SUBACROMIAL DECOMPRESSION,DISTAL CLAVICLE RESECTION, SADLABRAL DEBRIDEMENT CHONDROPLASTY, BICEP TENOTOMY ;  Surgeon: Tonya Cabal, MD;  Location: [facility];  Service: Orthopedics;  Laterality: Right;   TONSILLECTOMY AND ADENOIDECTOMY  AGE 49   TOTAL HIP ARTHROPLASTY Left  05/04/2017   Procedure: LEFT TOTAL HIP ARTHROPLASTY ANTERIOR APPROACH;  Surgeon: Tonya Cancel, MD;  Location: [facility];  Service: Orthopedics;  Laterality: Left;   TOTAL HIP ARTHROPLASTY Right 08/01/2019   Procedure: TOTAL HIP ARTHROPLASTY ANTERIOR APPROACH;  Surgeon: Tonya Cancel, MD;  Location: [facility];  Service: Orthopedics;  Laterality: Right;  52mins   TOTAL KNEE ARTHROPLASTY Bilateral LEFT  1996/   RIGHT 2004   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     Antiseborrheic Products, Misc. (PROMISEB) CREA Uses topical daily as needed on her face     apixaban (ELIQUIS) 5 MG TABS tablet Take 1 tablet (5 mg total) by mouth 2 (two) times daily. 180 tablet 1   atorvastatin (LIPITOR) 20 MG tablet Take 1 tablet (20 mg total) by mouth daily. 90 tablet 3   betamethasone dipropionate 0.05 % lotion APPLY TOPICALLY TO THE AFFECTED AREA DAILY 60 mL 2   escitalopram (LEXAPRO) 10 MG tablet TAKE 1 AND 1/2 TABLETS(15 MG) BY MOUTH DAILY 135 tablet 0   famotidine (PEPCID) 20 MG tablet Take 1 tablet (20 mg total) by mouth daily as needed for heartburn or indigestion. (Patient taking differently: Take 20 mg by mouth daily.) 90 tablet 3   FIBER PO Take 1 capsule by mouth in the morning and at bedtime.     HYDROcodone-acetaminophen (NORCO/VICODIN) 5-325 MG tablet 1 tablet as needed     hydrocortisone cream 1 % Apply 1 application topically daily as needed for itching.     L-Methylfolate-B12-B6-B2 (CEREFOLIN) 07-17-48-5 MG TABS TAKE 1 TABLET BY MOUTH EVERY DAY 90 tablet 0   losartan (COZAAR) 100 MG tablet Take 100 mg by mouth daily.     metoprolol tartrate (LOPRESSOR) 25 MG tablet Take 0.5 tablets (12.5 mg total) by mouth 2 (two) times daily. 60 tablet 3    rizatriptan (MAXALT) 10 MG tablet Take 1 tablet (10 mg total) by mouth as needed for migraine. May repeat in 2 hours if needed 10 tablet 2   sulfamethoxazole-trimethoprim (BACTRIM,SEPTRA) 400-80 MG tablet Take 1 tablet by mouth daily. For urethritis.     traZODone (DESYREL) 50 MG tablet TAKE 1 TABLET(50 MG) BY MOUTH AT BEDTIME 90 tablet 1   No current facility-administered medications for this encounter.    Allergies  Allergen Reactions   Otezla [Apremilast] Anaphylaxis    Suicidal ideation   Penicillins Anaphylaxis    Has patient had a PCN reaction  causing immediate rash, facial/tongue/throat swelling, SOB or lightheadedness with hypotension: Yes Has patient had a PCN reaction causing severe rash involving mucus membranes or skin necrosis: Yes Has patient had a PCN reaction that required hospitalization: No Has patient had a PCN reaction occurring within the last 10 year No If all of the above answers are "NO", then may proceed with Cephalosporin use.    Erythromycin Other (See Comments)    SEVERE STOMACH CRAMPS   Morphine And Related Nausea And Vomiting   Nsaids Other (See Comments)    SEVERE STOMACH CRAMPS, MOUTH SORES **Able to tolerate Tylenol   Remicade [Infliximab] Other (See Comments)    Shut down immune system-BP high   Tolmetin     SEVERE STOMACH CRAMPS   Nickel Rash    Including snaps on hospital gowns     Social History   Socioeconomic History   Marital status: Widowed    Spouse name: Not on file   Number of children: 3   Years of education: Not on file   Highest education level: Master's degree (e.g., MA, MS, MEng, MEd, MSW, MBA)  Occupational History   Not on file  Tobacco Use   Smoking status: Former    Packs/day: 0.50    Years: 15.00    Pack years: 7.50    Types: Cigarettes    Quit date: 05/15/1985    Years since quitting: 35.8   Smokeless tobacco: Never  Vaping Use   Vaping Use: Never used  Substance and Sexual Activity   Alcohol use: No     Comment: RECOVERING ALCOHOLIC--  QUIT [id number]   Drug use: No   Sexual activity: Not Currently    Birth control/protection: Post-menopausal  Other Topics Concern   Not on file  Social History Narrative   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    Widowed    Three kids    17 grandchildren       Social Determinants of Radio broadcast assistant Strain: Low Risk    Difficulty of Paying Living Expenses: Not hard at all  Food Insecurity: No Food Insecurity   Worried About Charity fundraiser in the Last Year: Never true   Arboriculturist in the Last Year: Never true  Transportation Needs: No Transportation Needs   Lack of Transportation (Medical): No   Lack of Transportation (Non-Medical): No  Physical Activity: Insufficiently Active   Days of Exercise per Week: 7 days   Minutes of Exercise per Session: 20 min  Stress: No Stress Concern Present   Feeling of Stress : Only a little  Social Connections: Moderately Isolated   Frequency of Communication with Friends and Family: More than three times a week   Frequency of Social Gatherings with Friends and Family: More than three times a week   Attends Religious Services: Never   Marine scientist or Organizations: Yes   Attends Archivist Meetings: 1 to 4 times per year   Marital Status: Widowed  Human resources officer Violence: Not At Risk   Fear of Current or Ex-Partner: No   Emotionally Abused: No   Physically Abused: No   Sexually Abused: No    Family History  Problem Relation Age of Onset   Melanoma Mother 67   Heart disease Father 22   Hypertension Father    Esophageal cancer Brother    Dementia Paternal Grandfather    Melanoma Niece 29   Colon cancer Neg  Hx    Colon polyps Neg Hx    Stomach cancer Neg Hx    Rectal cancer Neg Hx     ROS- All systems are reviewed and negative except as per the HPI above  Physical Exam: Vitals:   03/13/21 1438  BP: (!) 164/80   Pulse: (!) 51  Weight: 102.5 kg  Height: 5' 6.5" (1.689 m)   Wt Readings from Last 3 Encounters:  03/13/21 102.5 kg  03/11/21 102.4 kg  02/27/21 98.9 kg    Labs: Lab Results  Component Value Date   NA 137 02/12/2021   K 3.6 02/12/2021   CL 104 02/12/2021   CO2 27 02/12/2021   GLUCOSE 96 02/12/2021   BUN 13 02/12/2021   CREATININE 0.64 02/12/2021   CALCIUM 9.0 02/12/2021   Lab Results  Component Value Date   INR 1.0 07/12/2007   Lab Results  Component Value Date   CHOL 177 04/23/2020   HDL 69.40 04/23/2020   LDLCALC 92 04/23/2020   TRIG 76.0 04/23/2020     GEN- The patient is well appearing, alert and oriented x 3 today.   Head- normocephalic, atraumatic Eyes-  Sclera clear, conjunctiva pink Ears- hearing intact Oropharynx- clear Neck- supple, no JVP Lymph- no cervical lymphadenopathy Lungs- Clear to ausculation bilaterally, normal work of breathing Heart- Regular rate and rhythm, no murmurs, rubs or gallops, PMI not laterally displaced GI- soft, NT, ND, + BS Extremities- no clubbing, cyanosis, or edema MS- no significant deformity or atrophy Skin- no rash or lesion Psych- euthymic mood, full affect Neuro- strength and sensation are intact  EKG-sinus brady at 51 bpm, pr int 180 ms, qrs int 96 ms, qtc 406 ms   Zio patch reviewed   Assessment and Plan:  1. Paroxysmal afib  Maintaining  SR  Pt noticed short erratic heart rate episodes during the day but this resolved with metoprolol 12. 5 mg bid  Continue  metoprolol tartrate 25 mg 1/2 tab bid   2. CHA2DS2VASc  score of 4 Continue eliquis 5 mg bid   F/u here  in 6 months    Butch Penny C. Damarys Speir, [facility] [address] [place], [place] [address] [phone]

## 2021-03-13 NOTE — Progress Notes (Signed)
Location of Breast Cancer:  Left DCIS  Histology per Pathology Report:  Breast, left, needle core biopsy, lateral, 2:30 o'clock - DUCTAL CARCINOMA IN SITU WITH NECROSIS AND CALCIFICATIONS  A. BREAST, LEFT, LUMPECTOMY:  - Ductal carcinoma in situ, 0.8 cm in maximal dimension.  - Tumor approaches to 0.5 cm of closest (deep) resection margin.  - Biopsy site changes and hematoma.  - Fbrosis and reactive changes.   Receptor Status:  Estrogen Receptor: 100%, POSITIVE, STRONG STAINING INTENSITY Progesterone Receptor: 70%, POSITIVE, MODERATE STAINING INTENSITY  Did patient present with symptoms (if so, please note symptoms) or was this found on screening mammography?: initially presented for mammography on 05/30/20 which showed likely benign left breast calcifications.   Past/Anticipated interventions by surgeon, if any:  Left Breast Radioactive seed localized lumpectomy Surgeon: [name]   Past/Anticipated interventions by medical oncology, if any: Dr Lindi Adie Treatment plan: 1. adjuvant radiation therapy 2. Followed by antiestrogen therapy with tamoxifen 5 years  Lymphedema issues, if any:  no    Pain issues, if any:  no   SAFETY ISSUES: Prior radiation? no Pacemaker/ICD? no Possible current pregnancy? no, hysterectomy Is the patient on methotrexate? no  Current Complaints / other details:  none    Vitals:   03/19/21 1305  BP: (!) 150/73  Pulse: 61  Resp: 20  Temp: 97.8 F (36.6 C)  SpO2: 100%  Weight: 226 lb 9.6 oz (102.8 kg)  Height: 5' 6.5" (1.689 m)

## 2021-03-18 NOTE — Progress Notes (Signed)
Radiation Oncology         [phone] ________________________________  Name: Tonya Bartlett MRN: [id number]  Date: 03/19/2021  DOB: [birth year]  Re-Evaluation Note  CC: [name], NP  [name], MD    ICD-10-CM   1. 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 She was seen in the multidisciplinary breast clinic on 01/15/21.   Since consultation, she underwent genetic testing on 01/15/21. Results showed no clinically significant variants detected from +RNAinsight testing..  She opted to proceed with left breast lumpectomy on 02/27/21 under the care of Dr. Barry Dienes. Pathology from the procedure revealed: grade 2 ductal carcinoma in-situ measuring 0.8 cm in the maximal dimension, with fibrosis and reactive changes. All margins negative for DCIS, with tumor approaching to 0.5 cm of closest (deep) resection margin. ER status 100% positive, PR status 70% positive, both with strong staining intensity; Her2 not assessed. No lymph nodes were examined.  Following XRT, the patient will return to Dr. Lindi Adie to begin antiestrogen therapy consisting of tamoxifen  x 5 years.   Pertinent imaging since the patient was last seen includes:  --Diagnostic right breast mammogram on 01/31/21 showed no evidence of malignancy involving the right breast.   On review of systems, the patient reports occasional sharp shooting pain in the left breast since her surgery. She denies swelling in her left arm or hand and any other symptoms.    Allergies:  is allergic to otezla [apremilast], penicillins, erythromycin, morphine and related, nsaids, remicade [infliximab], tolmetin, and nickel.  Meds: Current Outpatient Medications  Medication Sig Dispense Refill   acetaminophen (TYLENOL) 325  MG tablet Take 650 mg by mouth every 6 (six) hours as needed for moderate pain.     Antiseborrheic Products, Misc. (PROMISEB) CREA Uses topical daily as needed on her face     apixaban (ELIQUIS) 5 MG TABS tablet Take 1 tablet (5 mg total) by mouth 2 (two) times daily. 180 tablet 1   atorvastatin (LIPITOR) 20 MG tablet Take 1 tablet (20 mg total) by mouth daily. 90 tablet 3   betamethasone dipropionate 0.05 % lotion APPLY TOPICALLY TO THE AFFECTED AREA DAILY 60 mL 2   escitalopram (LEXAPRO) 10 MG tablet TAKE 1 AND 1/2 TABLETS(15 MG) BY MOUTH DAILY 135 tablet 0   famotidine (PEPCID) 20 MG tablet Take 1 tablet (20 mg total) by mouth daily as needed for heartburn or indigestion. (Patient taking differently: Take 20 mg by mouth daily.) 90 tablet 3   FIBER PO Take 1 capsule by mouth in the morning and at bedtime.     HYDROcodone-acetaminophen (NORCO/VICODIN) 5-325 MG tablet 1 tablet as needed     hydrocortisone cream 1 % Apply 1 application topically daily as needed for itching.     L-Methylfolate-B12-B6-B2 (CEREFOLIN) 07-17-48-5 MG TABS TAKE 1 TABLET BY MOUTH EVERY DAY 90 tablet 0   losartan (COZAAR) 100 MG tablet Take 100 mg by mouth daily.     metoprolol tartrate (LOPRESSOR) 25 MG tablet Take 0.5 tablets (12.5 mg total) by mouth 2 (two) times daily. 60 tablet 3   rizatriptan (MAXALT) 10 MG tablet Take 1 tablet (10 mg total) by mouth as needed for migraine. May repeat in 2 hours if needed 10 tablet 2   sulfamethoxazole-trimethoprim (  MG tablet Take 1 tablet by mouth daily. For urethritis.    ° traZODone (DESYREL) 50 MG tablet TAKE 1 TABLET(50 MG) BY MOUTH AT BEDTIME 90 tablet 1  ° °No current facility-administered medications for this encounter.  ° ° °Physical Findings: °The patient is in no acute distress. Patient is alert and oriented. ° height is 5' 6.5" (1.689 m) and weight is 226 lb 9.6 oz (102.8 kg). Her temperature is 97.8 °F (36.6 °C). Her blood pressure is 150/73 (abnormal) and  her pulse is 61. Her respiration is 20 and oxygen saturation is 100%.  No significant changes. Lungs are clear to auscultation bilaterally. Heart has regular rate and rhythm. No palpable cervical, supraclavicular, or axillary adenopathy. Abdomen soft, non-tender, normal bowel sounds. ° °Left breast: Well-healing scar in the lateral aspect of the breast.  No signs of drainage or infection.  No dominant masses palpable within the breast nipple discharge or bleeding. ° °Lab Findings: °Lab Results  °Component Value Date  ° WBC 6.9 02/12/2021  ° HGB 12.6 02/12/2021  ° HCT 38.6 02/12/2021  ° MCV 102.7 (H) 02/12/2021  ° PLT 268 02/12/2021  ° ° °Radiographic Findings: °MM Breast Surgical Specimen ° °Result Date: 02/27/2021 °CLINICAL DATA:  Patient status post left breast lumpectomy. EXAM: SPECIMEN RADIOGRAPH OF THE LEFT BREAST COMPARISON:  Previous exam(s). FINDINGS: Status post excision of the left breast. The radioactive seed and biopsy marker clip are present, completely intact, and were marked for pathology. IMPRESSION: Specimen radiograph of the left breast. Electronically Signed   By: Drew  Davis M.D.   On: 02/27/2021 15:37 ° °MM LT RADIOACTIVE SEED LOC MAMMO GUIDE ° °Result Date: 02/25/2021 °CLINICAL DATA:  Patient with LEFT breast DCIS scheduled for lumpectomy requiring preoperative radioactive seed localization. EXAM: MAMMOGRAPHIC GUIDED RADIOACTIVE SEED LOCALIZATION OF THE LEFT BREAST COMPARISON:  Previous exam(s). FINDINGS: Patient presents for radioactive seed localization prior to lumpectomy. I met with the patient and we discussed the procedure of seed localization including benefits and alternatives. We discussed the high likelihood of a successful procedure. We discussed the risks of the procedure including infection, bleeding, tissue injury and further surgery. We discussed the low dose of radioactivity involved in the procedure. Informed, written consent was given. The usual time-out protocol was performed  immediately prior to the procedure. Using mammographic guidance, sterile technique, 1% lidocaine and an I-125 radioactive seed, the X shaped clip and adjacent residual calcifications were localized using a lateral approach. The follow-up mammogram images confirm the seed in the expected location and were marked for Dr. Byerly. Follow-up survey of the patient confirms presence of the radioactive seed. Order number of I-125 seed:  [id number]. Total activity:  0.244 millicuries reference Date: 02/05/2021 The patient tolerated the procedure well and was [facility] from the [facility]. She was given instructions regarding seed removal. IMPRESSION: Radioactive seed localization left breast. No apparent complications. The calcifications extend approximately 1.1 cm anterior to the radioactive seed. Electronically Signed   By: Stan  Maynard M.D.   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  We discussed that her prognosis overall is excellent.  She may be able to safely avoid radiation therapy in this situation if she agrees to proceed with adjuvant hormonal therapy which she is planning.  We discussed the chances for recurrence within the breast with and without radiation therapy. ° °  After careful evaluation the patient does wish to be aggressive and wishes to proceed with both radiation therapy and adjuvant hormonal therapy. ° °I discussed the general course of radiation therapy anticipated side effects and potential toxicities of radiation in this situation with her.  She appears to understand and wishes to proceed with treatments as planned. ° °Plan:  Patient is scheduled for CT simulation later today.  Anticipate approximately 3 and half to 4 weeks of radiation therapy using hypofractionated accelerated radiation therapy.  We will use cardiac sparing  techniques if necessary. ° °----------------------------------- ° ° D. , PhD, MD ° °This document serves as a record of services personally performed by  , MD. It was created on his behalf by Elisa Frazier, a trained medical scribe. The creation of this record is based on the scribe's personal observations and the provider's statements to them. This document has been checked and approved by the attending provider. ° °

## 2021-03-19 ENCOUNTER — Ambulatory Visit
Admission: RE | Admit: 2021-03-19 | Discharge: 2021-03-19 | Disposition: A | Discharge status: Home / Self Care | Payer: Medicare Other | Source: Ambulatory Visit | Attending: Radiation Oncology | Admitting: Radiation Oncology

## 2021-03-19 ENCOUNTER — Other Ambulatory Visit: Payer: Self-pay

## 2021-03-19 ENCOUNTER — Encounter: Payer: Self-pay | Admitting: Radiation Oncology

## 2021-03-19 VITALS — BP 150/73 | HR 61 | Temp 97.8°F | Resp 20 | Ht 66.5 in | Wt 226.6 lb

## 2021-03-19 DIAGNOSIS — Z79899 Other long term (current) drug therapy: Secondary | ICD-10-CM | POA: Insufficient documentation

## 2021-03-19 DIAGNOSIS — D0512 Intraductal carcinoma in situ of left breast: Secondary | ICD-10-CM

## 2021-03-19 DIAGNOSIS — Z510 Encounter for antineoplastic radiation therapy: Secondary | ICD-10-CM | POA: Diagnosis not present

## 2021-03-19 DIAGNOSIS — Z872 Personal history of diseases of the skin and subcutaneous tissue: Secondary | ICD-10-CM | POA: Diagnosis not present

## 2021-03-19 DIAGNOSIS — Z7901 Long term (current) use of anticoagulants: Secondary | ICD-10-CM | POA: Insufficient documentation

## 2021-03-19 DIAGNOSIS — Z923 Personal history of irradiation: Secondary | ICD-10-CM | POA: Diagnosis not present

## 2021-03-19 DIAGNOSIS — Z85820 Personal history of malignant melanoma of skin: Secondary | ICD-10-CM | POA: Diagnosis not present

## 2021-03-19 DIAGNOSIS — Z08 Encounter for follow-up examination after completed treatment for malignant neoplasm: Secondary | ICD-10-CM | POA: Diagnosis not present

## 2021-03-19 DIAGNOSIS — Z09 Encounter for follow-up examination after completed treatment for conditions other than malignant neoplasm: Secondary | ICD-10-CM | POA: Diagnosis not present

## 2021-03-19 DIAGNOSIS — Z170 Estrogen receptor positive status [ER+]: Secondary | ICD-10-CM | POA: Insufficient documentation

## 2021-03-19 DIAGNOSIS — C439 Malignant melanoma of skin, unspecified: Secondary | ICD-10-CM | POA: Diagnosis not present

## 2021-03-19 DIAGNOSIS — L810 Postinflammatory hyperpigmentation: Secondary | ICD-10-CM | POA: Diagnosis not present

## 2021-03-19 NOTE — Progress Notes (Signed)
See MD note for nursing evaluation. °

## 2021-03-21 ENCOUNTER — Ambulatory Visit (INDEPENDENT_AMBULATORY_CARE_PROVIDER_SITE_OTHER): Payer: Medicare Other | Admitting: Gastroenterology

## 2021-03-21 ENCOUNTER — Encounter: Payer: Self-pay | Admitting: Gastroenterology

## 2021-03-21 VITALS — BP 130/80 | HR 50 | Ht 66.5 in | Wt 226.0 lb

## 2021-03-21 DIAGNOSIS — K219 Gastro-esophageal reflux disease without esophagitis: Secondary | ICD-10-CM | POA: Diagnosis not present

## 2021-03-21 DIAGNOSIS — Z86010 Personal history of colonic polyps: Secondary | ICD-10-CM

## 2021-03-21 DIAGNOSIS — R1310 Dysphagia, unspecified: Secondary | ICD-10-CM | POA: Diagnosis not present

## 2021-03-21 MED ORDER — OMEPRAZOLE 40 MG PO CPDR: 40.0000 mg | DELAYED_RELEASE_CAPSULE | Freq: Every day | ORAL | 5 refills | Status: DC

## 2021-03-21 NOTE — Patient Instructions (Addendum)
IMAGING:  You will be contacted by [facility] (Your caller ID will indicate phone # [phone]) within the next business 7-10 business days to schedule your barium esophagram. If you have not heard from them within 7-10 business days, please call [facility] at [phone] to follow up on the status of your appointment.    We have sent the following medications to your pharmacy for you to pick up at your convenience: Omeprazole 40 mg daily 30-60 minutes before breakfast.   If you are age 73 or older, your body mass index should be between 23-30. Your Body mass index is 35.93 kg/m. If this is out of the aforementioned range listed, please consider follow up with your Primary Care Provider.  If you are age 45 or younger, your body mass index should be between 19-25. Your Body mass index is 35.93 kg/m. If this is out of the aformentioned range listed, please consider follow up with your Primary Care Provider.   ________________________________________________________  The [facility] providers would like to encourage you to use [facility] to communicate with providers for non-urgent requests or questions.  Due to long hold times on the telephone, sending your provider a message by [facility] may be a faster and more efficient way to get a response.  Please allow 48 business hours for a response.  Please remember that this is for non-urgent requests.  _______________________________________________________

## 2021-03-21 NOTE — Progress Notes (Signed)
03/21/2021 Tonya Bartlett [id number] 04-15-1945   HISTORY OF PRESENT ILLNESS: This is a 76 year old female who is a patient of Dr. Doyne Keel.  She is here today with complaints of dysphagia.  She just had an EGD performed back in September 2022 as follows:  - Esophagogastric landmarks identified. - 3 cm hiatal hernia. - Normal esophagus otherwise - no focal stenosis / stricture appreciated. Empirically dilated to 65mm. - Erythematous mucosa in the antrum. Biopsied to rule out H pylori. - Multiple gastric polyps, benign appearing. One representative polyp biopsied.  1. Surgical [P], gastric antrum and gastric body - REACTIVE GASTROPATHY - NO H. PYLORI OR INTESTINAL METAPLASIA IDENTIFIED - SEE COMMENT 2. Surgical [P], gastric polyp x1 - HYPERPLASTIC GASTRIC POLYP(S), INFLAMED - NO H. PYLORI, INTESTINAL METAPLASIA OR MALIGNANCY IDENTIFIED  She says that she has not noticed no difference in her symptoms since having that performed.  Still having issues with swallowing mostly solid food.  She is also having acid reflux/regurgitation.  Is currently on a Pepcid 20 mg at bedtime.  Had previously been on Prilosec in the past, but she does not even recall that.  While she is here we also discussed that she is due for colonoscopy.  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  She is beginning radiation treatments next week.  She will go every day, Monday through Friday, for about a month.  She is on Eliquis for atrial fibrillation that was discovered back in the fall at the time that she had her endoscopy performed.  She says that is doing well on metoprolol, etc.  She is moving her bowels well without issues.  Past Medical History:  Diagnosis Date   Allergic rhinitis    Allergy    Anemia    Anxiety    Blood transfusion without reported diagnosis    Breast cancer ([place]) 12/2020   left   Cataract    bil cataracts removed   Clotting  disorder ([place])    DVT- 1996 po knee replacement   Colitis    Complication of anesthesia    vomit x1 while on Morphine per pt   Depression    Diverticulosis    Dysrhythmia    Atrial fibrillation   Frequency of urination    GERD (gastroesophageal reflux disease)    History of colon polyps    History of DVT of lower extremity    POST LEFT TOTAL KNEE  1996   History of hiatal hernia    History of iron deficiency anemia 1996   iron infusion   History of migraine headaches    History of rib fracture    Hyperlipidemia    Hypertension    IBS (irritable bowel syndrome)    Insomnia    MCI (mild cognitive impairment)    Melanoma ([place]) 2019   left leg    Migraine headache    hx of migraines    OA (osteoarthritis)    RIGHT SHOULDER   Osteopenia    Pneumonia    remote history   PONV (postoperative nausea and vomiting)    Recovering alcoholic ([place])    SINCE [phone]   Right rotator cuff tear    Substance abuse ([place])    recovering alcoholic   Unspecified essential hypertension    Past Surgical History:  Procedure Laterality Date   BREAST BIOPSY Left 01/06/2021   pos   BREAST LUMPECTOMY WITH RADIOACTIVE SEED LOCALIZATION Left 02/27/2021   Procedure:  LEFT BREAST LUMPECTOMY WITH RADIOACTIVE SEED LOCALIZATION;  Surgeon: [name], MD;  Location: [facility];  Service: General;  Laterality: Left;   BUNIONECTOMY/  HAMMERTOE CORRECTION  RIGHT FOOT  2011   CATARACT EXTRACTION W/ INTRAOCULAR LENS  IMPLANT, BILATERAL     COLONOSCOPY     DILATION AND CURETTAGE OF UTERUS  1976   EYE SURGERY Bilateral 2016   cataract removal   KNEE ARTHROSCOPY W/ MENISCECTOMY Bilateral X2  LEFT /    X1  RIGHT   KNEE OPEN LATERAL RELEASE Bilateral    MOHS SURGERY Left 12/15/2017   Melanoma in situ - left calf - [facility]   ORIF ANKLE FRACTURE Right 08/04/2020   Procedure: OPEN REDUCTION INTERNAL FIXATION (ORIF) ANKLE FRACTURE;  Surgeon: Wylene Simmer, MD;  Location: [facility];  Service: Orthopedics;   Laterality: Right;  Mini C-arm, [employer] Small Frag   REPLACEMENT TOTAL KNEE Left 2006   SHOULDER ARTHROSCOPY WITH SUBACROMIAL DECOMPRESSION, ROTATOR CUFF REPAIR AND BICEP TENDON REPAIR Right 05/23/2013   Procedure: RIGHT SHOULDER ARTHROSCOPY EXAM UNDER ANESTHESIA  WITH SUBACROMIAL DECOMPRESSION,DISTAL CLAVICLE RESECTION, SADLABRAL DEBRIDEMENT CHONDROPLASTY, BICEP TENOTOMY ;  Surgeon: Sydnee Cabal, MD;  Location: [facility];  Service: Orthopedics;  Laterality: Right;   TONSILLECTOMY AND ADENOIDECTOMY  AGE 37   TOTAL HIP ARTHROPLASTY Left 05/04/2017   Procedure: LEFT TOTAL HIP ARTHROPLASTY ANTERIOR APPROACH;  Surgeon: Paralee Cancel, MD;  Location: [facility];  Service: Orthopedics;  Laterality: Left;   TOTAL HIP ARTHROPLASTY Right 08/01/2019   Procedure: TOTAL HIP ARTHROPLASTY ANTERIOR APPROACH;  Surgeon: Paralee Cancel, MD;  Location: [facility];  Service: Orthopedics;  Laterality: Right;  20mins   TOTAL KNEE ARTHROPLASTY Bilateral LEFT  1996/   RIGHT 2004   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 Her smoking use included cigarettes. She has a 7.50 pack-year smoking history. She has never used smokeless tobacco. She reports that she does not drink alcohol and does not use drugs. family history includes Dementia in her paternal grandfather; Esophageal cancer in her brother; Heart disease (age of onset: 67) in her father; Hypertension in her father; Melanoma (age of onset: 51) in her niece; Melanoma (age of onset: 50) in her mother. Allergies  Allergen Reactions   Otezla [Apremilast] Anaphylaxis    Suicidal ideation   Penicillins Anaphylaxis    Has patient had a PCN reaction causing immediate rash, facial/tongue/throat swelling, SOB or lightheadedness with hypotension: Yes Has patient had a PCN reaction causing severe rash involving mucus  membranes or skin necrosis: Yes Has patient had a PCN reaction that required hospitalization: No Has patient had a PCN reaction occurring within the last 10 year No If all of the above answers are "NO", then may proceed with Cephalosporin use.    Erythromycin Other (See Comments)    SEVERE STOMACH CRAMPS   Morphine And Related Nausea And Vomiting   Nsaids Other (See Comments)    SEVERE STOMACH CRAMPS, MOUTH SORES **Able to tolerate Tylenol   Remicade [Infliximab] Other (See Comments)    Shut down immune system-BP high   Tolmetin     SEVERE STOMACH CRAMPS   Nickel Rash    Including snaps on hospital gowns       Outpatient Encounter Medications as of 03/21/2021  Medication Sig   acetaminophen (TYLENOL) 325  MG tablet Take 650 mg by mouth every 6 (six) hours as needed for moderate pain.   Antiseborrheic Products, Misc. (PROMISEB) CREA Uses topical daily as needed on her face   apixaban (ELIQUIS) 5 MG TABS tablet Take 1 tablet (5 mg total) by mouth 2 (two) times daily.   atorvastatin (LIPITOR) 20 MG tablet Take 1 tablet (20 mg total) by mouth daily.   betamethasone dipropionate 0.05 % lotion APPLY TOPICALLY TO THE AFFECTED AREA DAILY   escitalopram (LEXAPRO) 10 MG tablet TAKE 1 AND 1/2 TABLETS(15 MG) BY MOUTH DAILY   famotidine (PEPCID) 20 MG tablet Take 1 tablet (20 mg total) by mouth daily as needed for heartburn or indigestion. (Patient taking differently: Take 20 mg by mouth daily.)   FIBER PO Take 1 capsule by mouth in the morning and at bedtime.   HYDROcodone-acetaminophen (NORCO/VICODIN) 5-325 MG tablet 1 tablet as needed   hydrocortisone cream 1 % Apply 1 application topically daily as needed for itching.   L-Methylfolate-B12-B6-B2 (CEREFOLIN) 07-17-48-5 MG TABS TAKE 1 TABLET BY MOUTH EVERY DAY   losartan (COZAAR) 100 MG tablet Take 100 mg by mouth daily.   metoprolol tartrate (LOPRESSOR) 25 MG tablet Take 0.5 tablets (12.5 mg total) by mouth 2 (two) times daily.   rizatriptan  (MAXALT) 10 MG tablet Take 1 tablet (10 mg total) by mouth as needed for migraine. May repeat in 2 hours if needed   sulfamethoxazole-trimethoprim (BACTRIM,SEPTRA) 400-80 MG tablet Take 1 tablet by mouth daily. For urethritis.   traZODone (DESYREL) 50 MG tablet TAKE 1 TABLET(50 MG) BY MOUTH AT BEDTIME   No facility-administered encounter medications on file as of 03/21/2021.     REVIEW OF SYSTEMS  : All other systems reviewed and negative except where noted in the History of Present Illness.   PHYSICAL EXAM: BP 130/80    Pulse (!) 50    Ht 5' 6.5" (1.689 m)    Wt 226 lb (102.5 kg)    BMI 35.93 kg/m  General: Well developed white female in no acute distress Head: Normocephalic and atraumatic Eyes:  Sclerae anicteric, conjunctiva pink. Ears: Normal auditory acuity Lungs: Clear throughout to auscultation; no W/R/R. Heart: Regular rate and rhythm; no M/R/G. Abdomen: Soft, non-distended.  BS present.  Non-tender. Musculoskeletal: Symmetrical with no gross deformities  Skin: No lesions on visible extremities Extremities: No edema  Neurological: Alert oriented x 4, grossly non-focal Psychological:  Alert and cooperative. Normal mood and affect  ASSESSMENT AND PLAN: *Dysphagia: Years ago had good response to dilation.  EGD in September 2022 showed no stricture or stenosis, but empiric dilation was performed.  She reports no difference since that was performed.  We will perform esophagram with tablet to look for dysmotility, etc.  She is also having acid reflux and is only using Pepcid 20 mg at bedtime so it could be uncontrolled acid reflux this is that is making possibly dysmotility, etc. worse.  We will start omeprazole 40 mg daily.  Prescription sent to pharmacy. *Personal history of colon polyps: Was due for colonoscopy 02/2021.  Last colonoscopy 02/2018 with repeat recommended in 3 years.  She is starting a months worth of radiation, every day for a month beginning next week.  We will hold off  on doing her colonoscopy until she completes those treatments.  **After we get the results of her esophagram we will plan to make her a follow-up here in early to mid March at which time we can discuss and schedule colonoscopy.  She  is on Eliquis due to a new diagnosis of atrial fibrillation back in the fall and that will need to be held for about 48 hours prior to her procedure.   CC:  [name], NP

## 2021-03-21 NOTE — Progress Notes (Signed)
Agree with assessment and plan as outlined.  

## 2021-03-25 DIAGNOSIS — D0512 Intraductal carcinoma in situ of left breast: Secondary | ICD-10-CM | POA: Diagnosis not present

## 2021-03-25 DIAGNOSIS — Z510 Encounter for antineoplastic radiation therapy: Secondary | ICD-10-CM | POA: Diagnosis not present

## 2021-03-25 DIAGNOSIS — Z170 Estrogen receptor positive status [ER+]: Secondary | ICD-10-CM | POA: Diagnosis not present

## 2021-03-26 ENCOUNTER — Telehealth: Payer: Self-pay | Admitting: Pharmacist

## 2021-03-26 NOTE — Chronic Care Management (AMB) (Signed)
Chronic Care Management Pharmacy Assistant   Name: Tonya Bartlett  MRN: [id number] DOB: 30-Nov-1945  Reason for Encounter: Disease State   Conditions to be addressed/monitored: HTN  Recent office visits:  None   Recent consult visits:  03/21/21 Zehr, Laban Emperor, PA-C [facility]) - Patient presented for Dysphagia and other concerns. Prescribed Omeprazole 40 mg.  03/19/21 [name] MD (Rad Onc) - Patient presented to [facility] for [place], Consult and Nurse Eval. No medication changes.  03/17/21 Georgianne Fick, MD (General Surg) - Patient presented for Pre-op visit. No medication changes.  03/13/21 Sherran Needs, NP ( Cardiology) - Patient presented for PAF and other concerns. No medication changes.  03/11/21 [name], MD (Oncology) - Patient presented for Ductal carcinoma in situ of left breast. Stopped Hydrocodone - Acetaminophen.  03/07/21 Leigh Aurora (Rheumatology) - Patient presented for follow up. No medication changes.  02/25/21 [name], MD (Imaging) - Patient presented for MM Breast Stereo Wire  02/12/21 [name], MD - Patient presented to [facility] for [place].  02/06/21 Sherran Needs, NP (Cardiology) - Patient presented for PAF and other concerns. Prescribed Metoprolol Tartrate.  01/15/21 [name], MD (Oncology) - Patient presented for Ductal carcinoma in situ of left breast. Stopped Calcium - Magnesium - Zinc, Cholecalciferol and Ketoconazole.  01/15/21 [name], MD (Rad Onc) - Patient presented for Ductal carcinoma in situ of left breast. No medication changes.  01/06/21  Patient presented to [facility] for Breast Calcification.  12/25/20 [name], MD (Cardiology) - Patient presented for Mixed Hyperlipidemia and other concerns. Prescribed Atorvastatin 20 mg.  12/19/20 Garvin Fila, MD (Neurology) - Patient presented for Dizziness and other concerns. Stopped  L- Methylfolate B-12 and Tramadol.   Hospital visits:  Medication Reconciliation was completed by comparing discharge summary, patients EMR and Pharmacy list, and upon discussion with patient.  Patient presented to [facility] on 02/27/21 due to Left Breast Lumpectomy with radioactive seed localization. Patient was present for 5 hours.  New?Medications Started at [facility] Discharge:?? -started  None  Medication Changes at Hospital Discharge: -Changed  None  Medications Discontinued at Hospital Discharge: -Stopped  None  Medications that remain the same after Hospital Discharge:??  -All other medications will remain the same.    Medications: Outpatient Encounter Medications as of 03/26/2021  Medication Sig Note   acetaminophen (TYLENOL) 325 MG tablet Take 650 mg by mouth every 6 (six) hours as needed for moderate pain.    Antiseborrheic Products, Misc. (PROMISEB) CREA Uses topical daily as needed on her face    apixaban (ELIQUIS) 5 MG TABS tablet Take 1 tablet (5 mg total) by mouth 2 (two) times daily.    atorvastatin (LIPITOR) 20 MG tablet Take 1 tablet (20 mg total) by mouth daily.    betamethasone dipropionate 0.05 % lotion APPLY TOPICALLY TO THE AFFECTED AREA DAILY 02/06/2021: Used on scalp   escitalopram (LEXAPRO) 10 MG tablet TAKE 1 AND 1/2 TABLETS(15 MG) BY MOUTH DAILY    famotidine (PEPCID) 20 MG tablet Take 1 tablet (20 mg total) by mouth daily as needed for heartburn or indigestion. (Patient taking differently: Take 20 mg by mouth daily.)    FIBER PO Take 1 capsule by mouth in the morning and at bedtime.    HYDROcodone-acetaminophen (NORCO/VICODIN) 5-325 MG tablet 1 tablet as needed    hydrocortisone cream 1 % Apply 1 application topically daily as needed for itching.  L-Methylfolate-B12-B6-B2 (CEREFOLIN) 07-17-48-5 MG TABS TAKE 1 TABLET BY MOUTH EVERY DAY    losartan (COZAAR) 100 MG tablet Take 100 mg by mouth daily.    metoprolol tartrate (LOPRESSOR) 25  MG tablet Take 0.5 tablets (12.5 mg total) by mouth 2 (two) times daily.    omeprazole (PRILOSEC) 40 MG capsule Take 1 capsule (40 mg total) by mouth daily.    rizatriptan (MAXALT) 10 MG tablet Take 1 tablet (10 mg total) by mouth as needed for migraine. May repeat in 2 hours if needed    sulfamethoxazole-trimethoprim (BACTRIM,SEPTRA) 400-80 MG tablet Take 1 tablet by mouth daily. For urethritis.    traZODone (DESYREL) 50 MG tablet TAKE 1 TABLET(50 MG) BY MOUTH AT BEDTIME    No facility-administered encounter medications on file as of 03/26/2021.  Reviewed chart prior to disease state call. Spoke with patient regarding BP  Recent Office Vitals: BP Readings from Last 3 Encounters:  03/21/21 130/80  03/19/21 (!) 150/73  03/13/21 (!) 164/80   Pulse Readings from Last 3 Encounters:  03/21/21 (!) 50  03/19/21 61  03/13/21 (!) 51    Wt Readings from Last 3 Encounters:  03/21/21 226 lb (102.5 kg)  03/19/21 226 lb 9.6 oz (102.8 kg)  03/13/21 226 lb (102.5 kg)     Kidney Function Lab Results  Component Value Date/Time   CREATININE 0.64 02/12/2021 02:33 PM   CREATININE 0.65 01/15/2021 12:45 PM   CREATININE 0.59 08/05/2020 09:17 AM   CREATININE 0.68 08/30/2015 03:31 PM   GFR 87.17 07/23/2020 04:56 PM   GFRNONAA >60 02/12/2021 02:33 PM   GFRNONAA >60 01/15/2021 12:45 PM   GFRAA >60 08/02/2019 03:06 AM    BMP Latest Ref Rng & Units 02/12/2021 01/15/2021 08/05/2020  Glucose 70 - 99 mg/dL 96 91 110(H)  BUN 8 - 23 mg/dL 13 11 13   Creatinine 0.44 - 1.00 mg/dL 0.64 0.65 0.59  Sodium 135 - 145 mmol/L 137 138 139  Potassium 3.5 - 5.1 mmol/L 3.6 3.7 3.6  Chloride 98 - 111 mmol/L 104 104 105  CO2 22 - 32 mmol/L 27 27 28   Calcium 8.9 - 10.3 mg/dL 9.0 9.2 8.9    Current antihypertensive regimen:  Losartan 100 mg 1 tablet daily  3 attempts made to reach patient    Care Gaps: BP- 130/80 (03/21/21) AWV- 2/22 CCM- 5/23 COVID Booster - Overdue Flu Vaccine - Overdue Colonoscopy -  Overdue  Star Rating Drugs: Losartan 100mg  - Last filled 02/28/21 90 DS at [facility] Atorvastatin 20 mg - Last filled 02/28/21 90 DS at [facility] Pharmacist Assistant [phone]

## 2021-03-27 ENCOUNTER — Other Ambulatory Visit: Payer: Self-pay

## 2021-03-27 ENCOUNTER — Encounter: Payer: Self-pay | Admitting: *Deleted

## 2021-03-27 ENCOUNTER — Ambulatory Visit
Admission: RE | Admit: 2021-03-27 | Discharge: 2021-03-27 | Disposition: A | Discharge status: Home / Self Care | Payer: Medicare Other | Source: Ambulatory Visit | Attending: Radiation Oncology | Admitting: Radiation Oncology

## 2021-03-27 DIAGNOSIS — D0512 Intraductal carcinoma in situ of left breast: Secondary | ICD-10-CM | POA: Diagnosis not present

## 2021-03-27 DIAGNOSIS — Z170 Estrogen receptor positive status [ER+]: Secondary | ICD-10-CM | POA: Diagnosis not present

## 2021-03-27 DIAGNOSIS — Z510 Encounter for antineoplastic radiation therapy: Secondary | ICD-10-CM | POA: Diagnosis not present

## 2021-03-28 ENCOUNTER — Ambulatory Visit
Admission: RE | Admit: 2021-03-28 | Discharge: 2021-03-28 | Disposition: A | Discharge status: Home / Self Care | Payer: Medicare Other | Source: Ambulatory Visit | Attending: Radiation Oncology | Admitting: Radiation Oncology

## 2021-03-28 ENCOUNTER — Telehealth: Payer: Self-pay | Admitting: Hematology and Oncology

## 2021-03-28 ENCOUNTER — Other Ambulatory Visit: Payer: Self-pay

## 2021-03-28 DIAGNOSIS — Z170 Estrogen receptor positive status [ER+]: Secondary | ICD-10-CM | POA: Diagnosis not present

## 2021-03-28 DIAGNOSIS — D0512 Intraductal carcinoma in situ of left breast: Secondary | ICD-10-CM | POA: Diagnosis not present

## 2021-03-28 DIAGNOSIS — Z510 Encounter for antineoplastic radiation therapy: Secondary | ICD-10-CM | POA: Diagnosis not present

## 2021-03-28 NOTE — Telephone Encounter (Signed)
Sch per 2/9 inbasket, pt aware °

## 2021-03-31 ENCOUNTER — Ambulatory Visit
Admission: RE | Admit: 2021-03-31 | Discharge: 2021-03-31 | Disposition: A | Discharge status: Home / Self Care | Payer: Medicare Other | Source: Ambulatory Visit | Attending: Radiation Oncology | Admitting: Radiation Oncology

## 2021-03-31 ENCOUNTER — Other Ambulatory Visit: Payer: Self-pay

## 2021-03-31 DIAGNOSIS — Z510 Encounter for antineoplastic radiation therapy: Secondary | ICD-10-CM | POA: Diagnosis not present

## 2021-03-31 DIAGNOSIS — Z170 Estrogen receptor positive status [ER+]: Secondary | ICD-10-CM | POA: Diagnosis not present

## 2021-03-31 DIAGNOSIS — D0512 Intraductal carcinoma in situ of left breast: Secondary | ICD-10-CM | POA: Diagnosis not present

## 2021-04-01 ENCOUNTER — Ambulatory Visit
Admission: RE | Admit: 2021-04-01 | Discharge: 2021-04-01 | Disposition: A | Discharge status: Home / Self Care | Payer: Medicare Other | Source: Ambulatory Visit | Attending: Radiation Oncology | Admitting: Radiation Oncology

## 2021-04-01 DIAGNOSIS — Z170 Estrogen receptor positive status [ER+]: Secondary | ICD-10-CM | POA: Diagnosis not present

## 2021-04-01 DIAGNOSIS — D0512 Intraductal carcinoma in situ of left breast: Secondary | ICD-10-CM

## 2021-04-01 DIAGNOSIS — Z510 Encounter for antineoplastic radiation therapy: Secondary | ICD-10-CM | POA: Diagnosis not present

## 2021-04-01 MED ORDER — SONAFINE EX EMUL
1.0000 "application " | Freq: Once | CUTANEOUS | Status: AC
  Administered 2021-04-01: 1 via TOPICAL

## 2021-04-02 ENCOUNTER — Other Ambulatory Visit: Payer: Self-pay

## 2021-04-02 ENCOUNTER — Telehealth (INDEPENDENT_AMBULATORY_CARE_PROVIDER_SITE_OTHER): Payer: Medicare Other | Admitting: Psychiatry

## 2021-04-02 ENCOUNTER — Encounter (HOSPITAL_COMMUNITY): Payer: Self-pay | Admitting: Psychiatry

## 2021-04-02 ENCOUNTER — Ambulatory Visit
Admission: RE | Admit: 2021-04-02 | Discharge: 2021-04-02 | Disposition: A | Discharge status: Home / Self Care | Payer: Medicare Other | Source: Ambulatory Visit | Attending: Radiation Oncology | Admitting: Radiation Oncology

## 2021-04-02 DIAGNOSIS — F331 Major depressive disorder, recurrent, moderate: Secondary | ICD-10-CM

## 2021-04-02 DIAGNOSIS — D0512 Intraductal carcinoma in situ of left breast: Secondary | ICD-10-CM | POA: Diagnosis not present

## 2021-04-02 DIAGNOSIS — Z170 Estrogen receptor positive status [ER+]: Secondary | ICD-10-CM | POA: Diagnosis not present

## 2021-04-02 DIAGNOSIS — F5102 Adjustment insomnia: Secondary | ICD-10-CM | POA: Diagnosis not present

## 2021-04-02 DIAGNOSIS — Z510 Encounter for antineoplastic radiation therapy: Secondary | ICD-10-CM | POA: Diagnosis not present

## 2021-04-02 MED ORDER — VENLAFAXINE HCL ER 37.5 MG PO CP24: 37.5000 mg | ORAL_CAPSULE | Freq: Every day | ORAL | 0 refills | Status: DC

## 2021-04-02 NOTE — Progress Notes (Signed)
[facility] Outpatient visit Tele psych  Patient Identification: Tonya Bartlett MRN:  [id number] Date of Evaluation:  04/02/2021 Referral Source: NP Chief Complaint:   Depression follow up  Visit Diagnosis:    ICD-10-CM   1. Major depressive disorder, recurrent episode, moderate (HCC)  F33.1     2. Adjustment insomnia  F51.02      Virtual Visit via Video Note  I connected with Tonya Bartlett on 04/02/21 at 10:30 AM EST by a video enabled telemedicine application and verified that I am speaking with the correct person using two identifiers.  Location: Patient: home Provider: home office   I discussed the limitations of evaluation and management by telemedicine and the availability of in person appointments. The patient expressed understanding and agreed to proceed.      I discussed the assessment and treatment plan with the patient. The patient was provided an opportunity to ask questions and all were answered. The patient agreed with the plan and demonstrated an understanding of the instructions.   The patient was advised to call back or seek an in-person evaluation if the symptoms worsen or if the condition fails to improve as anticipated.  I provided 15 minutes of non-face-to-face time during this encounter.   History of Present Illness: 76 years old currently widowed white female lives by herself has 3 grown kids and 6 grandkids referred initially by family medicine for management of depression   Remains sober 40 years plus and active in [place] Has had breast surgery daignosed with cancer, recovering, on radiation.  Says all taken care of Following with oncology and PCP since had AF now on meds  Says oncology suggest to be on effexor instead of lexapro Plans to be on oral med for remission of breast cancer   Denies dizziness as of now   Modifying factors : family son, daughter is now communicating aggravating factors. Hip surgery, breast surgery Severity managing depression  fair  Past Psychiatric History: depression  Previous Psychotropic Medications: Yes   Substance Abuse History in the last 12 months:  No.  Consequences of Substance Abuse: NA  Past Medical History:  Past Medical History:  Diagnosis Date   Allergic rhinitis    Allergy    Anemia    Anxiety    Blood transfusion without reported diagnosis    Breast cancer ([place]) 12/2020   left   Cataract    bil cataracts removed   Clotting disorder ([place])    DVT- 1996 po knee replacement   Colitis    Complication of anesthesia    vomit x1 while on Morphine per pt   Depression    Diverticulosis    Dysrhythmia    Atrial fibrillation   Frequency of urination    GERD (gastroesophageal reflux disease)    History of colon polyps    History of DVT of lower extremity    POST LEFT TOTAL KNEE  1996   History of hiatal hernia    History of iron deficiency anemia 1996   iron infusion   History of migraine headaches    History of rib fracture    Hyperlipidemia    Hypertension    IBS (irritable bowel syndrome)    Insomnia    MCI (mild cognitive impairment)    Melanoma ([place]) 2019   left leg    Migraine headache    hx of migraines    OA (osteoarthritis)    RIGHT SHOULDER   Osteopenia    Pneumonia    remote  history   PONV (postoperative nausea and vomiting)    Recovering alcoholic ([place])    SINCE [id number]   Right rotator cuff tear    Substance abuse ([place])    recovering alcoholic   Unspecified essential hypertension     Past Surgical History:  Procedure Laterality Date   BREAST BIOPSY Left 01/06/2021   pos   BREAST LUMPECTOMY WITH RADIOACTIVE SEED LOCALIZATION Left 02/27/2021   Procedure: LEFT BREAST LUMPECTOMY WITH RADIOACTIVE SEED LOCALIZATION;  Surgeon: [name], MD;  Location: [facility];  Service: General;  Laterality: Left;   BUNIONECTOMY/  HAMMERTOE CORRECTION  RIGHT FOOT  2011   CATARACT EXTRACTION W/ INTRAOCULAR LENS  IMPLANT, BILATERAL     COLONOSCOPY     DILATION AND  CURETTAGE OF UTERUS  1976   EYE SURGERY Bilateral 2016   cataract removal   KNEE ARTHROSCOPY W/ MENISCECTOMY Bilateral X2  LEFT /    X1  RIGHT   KNEE OPEN LATERAL RELEASE Bilateral    MOHS SURGERY Left 12/15/2017   Melanoma in situ - left calf - [facility]   ORIF ANKLE FRACTURE Right 08/04/2020   Procedure: OPEN REDUCTION INTERNAL FIXATION (ORIF) ANKLE FRACTURE;  Surgeon: Wylene Simmer, MD;  Location: [facility];  Service: Orthopedics;  Laterality: Right;  Mini C-arm, [employer] Small Frag   REPLACEMENT TOTAL KNEE Left 2006   SHOULDER ARTHROSCOPY WITH SUBACROMIAL DECOMPRESSION, ROTATOR CUFF REPAIR AND BICEP TENDON REPAIR Right 05/23/2013   Procedure: RIGHT SHOULDER ARTHROSCOPY EXAM UNDER ANESTHESIA  WITH SUBACROMIAL DECOMPRESSION,DISTAL CLAVICLE RESECTION, SADLABRAL DEBRIDEMENT CHONDROPLASTY, BICEP TENOTOMY ;  Surgeon: Sydnee Cabal, MD;  Location: [facility];  Service: Orthopedics;  Laterality: Right;   TONSILLECTOMY AND ADENOIDECTOMY  AGE 44   TOTAL HIP ARTHROPLASTY Left 05/04/2017   Procedure: LEFT TOTAL HIP ARTHROPLASTY ANTERIOR APPROACH;  Surgeon: Paralee Cancel, MD;  Location: [facility];  Service: Orthopedics;  Laterality: Left;   TOTAL HIP ARTHROPLASTY Right 08/01/2019   Procedure: TOTAL HIP ARTHROPLASTY ANTERIOR APPROACH;  Surgeon: Paralee Cancel, MD;  Location: [facility];  Service: Orthopedics;  Laterality: Right;  70mins   TOTAL KNEE ARTHROPLASTY Bilateral LEFT  1996/   RIGHT 2004   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      UPPER GASTROINTESTINAL ENDOSCOPY     UPPER GI ENDOSCOPY     VAGINAL HYSTERECTOMY  1976    Family Psychiatric History: mom possible depression  Family History:  Family History  Problem Relation Age of Onset   Melanoma Mother 84   Heart disease Father 44   Hypertension Father    Esophageal cancer Brother    Dementia Paternal Grandfather    Melanoma Niece 19   Colon cancer Neg Hx    Colon polyps Neg Hx    Stomach cancer Neg Hx    Rectal  cancer Neg Hx     Social History:   Social History   Socioeconomic History   Marital status: Widowed    Spouse name: Not on file   Number of children: 3   Years of education: Not on file   Highest education level: Master's degree (e.g., MA, MS, MEng, MEd, MSW, MBA)  Occupational History   Occupation: retired Transport planner  Tobacco Use   Smoking status: Former    Packs/day: 0.50    Years: 15.00    Pack years: 7.50    Types: Cigarettes    Quit date: 05/15/1985    Years since quitting: 35.9   Smokeless tobacco: Never  Vaping Use   Vaping Use:  Never used  Substance and Sexual Activity   Alcohol use: No    Comment: RECOVERING ALCOHOLIC--  QUIT [id number]   Drug use: No   Sexual activity: Not Currently    Birth control/protection: Post-menopausal  Other Topics Concern   Not on file  Social History Narrative   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    Widowed    Three kids    81 grandchildren       Social Determinants of Radio broadcast assistant Strain: Low Risk    Difficulty of Paying Living Expenses: Not hard at all  Food Insecurity: No Food Insecurity   Worried About Charity fundraiser in the Last Year: Never true   Arboriculturist in the Last Year: Never true  Transportation Needs: No Transportation Needs   Lack of Transportation (Medical): No   Lack of Transportation (Non-Medical): No  Physical Activity: Insufficiently Active   Days of Exercise per Week: 7 days   Minutes of Exercise per Session: 20 min  Stress: No Stress Concern Present   Feeling of Stress : Only a little  Social Connections: Moderately Isolated   Frequency of Communication with Friends and Family: More than three times a week   Frequency of Social Gatherings with Friends and Family: More than three times a week   Attends Religious Services: Never   Marine scientist or Organizations: Yes   Attends Archivist Meetings: 1 to 4 times per year    Marital Status: Widowed     Allergies:   Allergies  Allergen Reactions   Otezla [Apremilast] Anaphylaxis    Suicidal ideation   Penicillins Anaphylaxis    Has patient had a PCN reaction causing immediate rash, facial/tongue/throat swelling, SOB or lightheadedness with hypotension: Yes Has patient had a PCN reaction causing severe rash involving mucus membranes or skin necrosis: Yes Has patient had a PCN reaction that required hospitalization: No Has patient had a PCN reaction occurring within the last 10 year No If all of the above answers are "NO", then may proceed with Cephalosporin use.    Erythromycin Other (See Comments)    SEVERE STOMACH CRAMPS   Morphine And Related Nausea And Vomiting   Nsaids Other (See Comments)    SEVERE STOMACH CRAMPS, MOUTH SORES **Able to tolerate Tylenol   Remicade [Infliximab] Other (See Comments)    Shut down immune system-BP high   Tolmetin     SEVERE STOMACH CRAMPS   Nickel Rash    Including snaps on hospital gowns     Metabolic Disorder Labs: Lab Results  Component Value Date   HGBA1C 5.3 11/05/2016   No results found for: PROLACTIN Lab Results  Component Value Date   CHOL 177 04/23/2020   TRIG 76.0 04/23/2020   HDL 69.40 04/23/2020   CHOLHDL 3 04/23/2020   VLDL 15.2 04/23/2020   LDLCALC 92 04/23/2020   LDLCALC 100 (H) 02/03/2019     Current Medications: Current Outpatient Medications  Medication Sig Dispense Refill   venlafaxine XR (EFFEXOR XR) 37.5 MG 24 hr capsule Take 1 capsule (37.5 mg total) by mouth daily with breakfast. 30 capsule 0   acetaminophen (TYLENOL) 325 MG tablet Take 650 mg by mouth every 6 (six) hours as needed for moderate pain.     Antiseborrheic Products, Misc. (PROMISEB) CREA Uses topical daily as needed on her face     apixaban (ELIQUIS) 5 MG TABS tablet Take 1 tablet (5  mg total) by mouth 2 (two) times daily. 180 tablet 1   atorvastatin (LIPITOR) 20 MG tablet Take 1 tablet (20 mg total) by mouth  daily. 90 tablet 3   betamethasone dipropionate 0.05 % lotion APPLY TOPICALLY TO THE AFFECTED AREA DAILY 60 mL 2   escitalopram (LEXAPRO) 10 MG tablet TAKE 1 AND 1/2 TABLETS(15 MG) BY MOUTH DAILY 135 tablet 0   famotidine (PEPCID) 20 MG tablet Take 1 tablet (20 mg total) by mouth daily as needed for heartburn or indigestion. (Patient taking differently: Take 20 mg by mouth daily.) 90 tablet 3   FIBER PO Take 1 capsule by mouth in the morning and at bedtime.     HYDROcodone-acetaminophen (NORCO/VICODIN) 5-325 MG tablet 1 tablet as needed     hydrocortisone cream 1 % Apply 1 application topically daily as needed for itching.     L-Methylfolate-B12-B6-B2 (CEREFOLIN) 07-17-48-5 MG TABS TAKE 1 TABLET BY MOUTH EVERY DAY 90 tablet 0   losartan (COZAAR) 100 MG tablet Take 100 mg by mouth daily.     metoprolol tartrate (LOPRESSOR) 25 MG tablet Take 0.5 tablets (12.5 mg total) by mouth 2 (two) times daily. 60 tablet 3   omeprazole (PRILOSEC) 40 MG capsule Take 1 capsule (40 mg total) by mouth daily. 30 capsule 5   rizatriptan (MAXALT) 10 MG tablet Take 1 tablet (10 mg total) by mouth as needed for migraine. May repeat in 2 hours if needed 10 tablet 2   sulfamethoxazole-trimethoprim (BACTRIM,SEPTRA) 400-80 MG tablet Take 1 tablet by mouth daily. For urethritis.     traZODone (DESYREL) 50 MG tablet TAKE 1 TABLET(50 MG) BY MOUTH AT BEDTIME 90 tablet 1   No current facility-administered medications for this visit.     Psychiatric Specialty Exam: Review of Systems  Cardiovascular:  Negative for chest pain.  Psychiatric/Behavioral:  Negative for depression and suicidal ideas.    There were no vitals taken for this visit.There is no height or weight on file to calculate BMI.  General Appearance: casual  Eye Contact:  fair  Speech:  Normal Rate  Volume:  Decreased  Mood: fair  Affect:   Thought Process:  Goal Directed  Orientation:  Full (Time, Place, and Person)  Thought Content:  Rumination  Suicidal  Thoughts:  No  Homicidal Thoughts:  No  Memory:  Immediate;   Fair Recent;   Fair  Judgement:  Fair  Insight:  Fair  Psychomotor Activity:  Normal  Concentration:  Concentration: Fair and Attention Span: Fair  Recall:  [employer] of Knowledge:Good  Language: Good  Akathisia:  No  Handed:  Right  AIMS (if indicated):    Assets:  Desire for Improvement  ADL's:  Intact  Cognition: Impaired,  Mild  Sleep:  Variable to fair on meds    Treatment Plan Summary: Medication management and Plan as follows    Prior documentation reviewed  1. MDD, mild to moderate:says surgery went well on radiation. Motivation is good Will change to low dose effexor and taper off lexapro, fu with oncology and pcp 2. Insomnia: not worse, on trazadone   Alcohol dependence:sustained remission and continue AA has support  Fu 33m or earlier if needed  Collaboration of Care:  Follows with PCP and oncology.   Patient/Guardian was advised Release of Information must be obtained prior to any record release in order to collaborate their care with an outside provider. Patient/Guardian was advised if they have not already done so to contact the registration department to sign  all necessary forms in order for [name] to release information regarding their care.   Consent: Patient/Guardian gives verbal consent for treatment and assignment of benefits for services provided during this visit. Patient/Guardian expressed understanding and agreed to proceed.     [name], MD 2/15/202310:46 AM

## 2021-04-03 ENCOUNTER — Ambulatory Visit
Admission: RE | Admit: 2021-04-03 | Discharge: 2021-04-03 | Disposition: A | Discharge status: Home / Self Care | Payer: Medicare Other | Source: Ambulatory Visit | Attending: Radiation Oncology | Admitting: Radiation Oncology

## 2021-04-03 DIAGNOSIS — Z170 Estrogen receptor positive status [ER+]: Secondary | ICD-10-CM | POA: Diagnosis not present

## 2021-04-03 DIAGNOSIS — D0512 Intraductal carcinoma in situ of left breast: Secondary | ICD-10-CM | POA: Diagnosis not present

## 2021-04-03 DIAGNOSIS — Z510 Encounter for antineoplastic radiation therapy: Secondary | ICD-10-CM | POA: Diagnosis not present

## 2021-04-04 ENCOUNTER — Other Ambulatory Visit: Payer: Self-pay

## 2021-04-04 ENCOUNTER — Ambulatory Visit
Admission: RE | Admit: 2021-04-04 | Discharge: 2021-04-04 | Disposition: A | Discharge status: Home / Self Care | Payer: Medicare Other | Source: Ambulatory Visit | Attending: Radiation Oncology | Admitting: Radiation Oncology

## 2021-04-04 DIAGNOSIS — M7661 Achilles tendinitis, right leg: Secondary | ICD-10-CM | POA: Diagnosis not present

## 2021-04-04 DIAGNOSIS — Z510 Encounter for antineoplastic radiation therapy: Secondary | ICD-10-CM | POA: Diagnosis not present

## 2021-04-04 DIAGNOSIS — M25571 Pain in right ankle and joints of right foot: Secondary | ICD-10-CM | POA: Diagnosis not present

## 2021-04-04 DIAGNOSIS — Z170 Estrogen receptor positive status [ER+]: Secondary | ICD-10-CM | POA: Diagnosis not present

## 2021-04-04 DIAGNOSIS — D0512 Intraductal carcinoma in situ of left breast: Secondary | ICD-10-CM | POA: Diagnosis not present

## 2021-04-07 ENCOUNTER — Ambulatory Visit
Admission: RE | Admit: 2021-04-07 | Discharge: 2021-04-07 | Disposition: A | Discharge status: Home / Self Care | Payer: Medicare Other | Source: Ambulatory Visit | Attending: Radiation Oncology | Admitting: Radiation Oncology

## 2021-04-07 ENCOUNTER — Other Ambulatory Visit: Payer: Self-pay

## 2021-04-07 DIAGNOSIS — Z170 Estrogen receptor positive status [ER+]: Secondary | ICD-10-CM | POA: Diagnosis not present

## 2021-04-07 DIAGNOSIS — D0512 Intraductal carcinoma in situ of left breast: Secondary | ICD-10-CM | POA: Diagnosis not present

## 2021-04-07 DIAGNOSIS — Z510 Encounter for antineoplastic radiation therapy: Secondary | ICD-10-CM | POA: Diagnosis not present

## 2021-04-08 ENCOUNTER — Ambulatory Visit
Admission: RE | Admit: 2021-04-08 | Discharge: 2021-04-08 | Disposition: A | Discharge status: Home / Self Care | Payer: Medicare Other | Source: Ambulatory Visit | Attending: Radiation Oncology | Admitting: Radiation Oncology

## 2021-04-08 DIAGNOSIS — D0512 Intraductal carcinoma in situ of left breast: Secondary | ICD-10-CM | POA: Diagnosis not present

## 2021-04-08 DIAGNOSIS — Z170 Estrogen receptor positive status [ER+]: Secondary | ICD-10-CM | POA: Diagnosis not present

## 2021-04-08 DIAGNOSIS — Z510 Encounter for antineoplastic radiation therapy: Secondary | ICD-10-CM | POA: Diagnosis not present

## 2021-04-09 ENCOUNTER — Other Ambulatory Visit: Payer: Self-pay

## 2021-04-09 ENCOUNTER — Ambulatory Visit
Admission: RE | Admit: 2021-04-09 | Discharge: 2021-04-09 | Disposition: A | Discharge status: Home / Self Care | Payer: Medicare Other | Source: Ambulatory Visit | Attending: Radiation Oncology | Admitting: Radiation Oncology

## 2021-04-09 DIAGNOSIS — Z170 Estrogen receptor positive status [ER+]: Secondary | ICD-10-CM | POA: Diagnosis not present

## 2021-04-09 DIAGNOSIS — D0512 Intraductal carcinoma in situ of left breast: Secondary | ICD-10-CM | POA: Diagnosis not present

## 2021-04-09 DIAGNOSIS — Z510 Encounter for antineoplastic radiation therapy: Secondary | ICD-10-CM | POA: Diagnosis not present

## 2021-04-10 ENCOUNTER — Ambulatory Visit
Admission: RE | Admit: 2021-04-10 | Discharge: 2021-04-10 | Disposition: A | Discharge status: Home / Self Care | Payer: Medicare Other | Source: Ambulatory Visit | Attending: Radiation Oncology | Admitting: Radiation Oncology

## 2021-04-10 DIAGNOSIS — Z510 Encounter for antineoplastic radiation therapy: Secondary | ICD-10-CM | POA: Diagnosis not present

## 2021-04-10 DIAGNOSIS — Z170 Estrogen receptor positive status [ER+]: Secondary | ICD-10-CM | POA: Diagnosis not present

## 2021-04-10 DIAGNOSIS — D0512 Intraductal carcinoma in situ of left breast: Secondary | ICD-10-CM | POA: Diagnosis not present

## 2021-04-11 ENCOUNTER — Ambulatory Visit
Admission: RE | Admit: 2021-04-11 | Discharge: 2021-04-11 | Disposition: A | Discharge status: Home / Self Care | Payer: Medicare Other | Source: Ambulatory Visit | Attending: Radiation Oncology | Admitting: Radiation Oncology

## 2021-04-11 ENCOUNTER — Other Ambulatory Visit: Payer: Self-pay

## 2021-04-11 DIAGNOSIS — Z170 Estrogen receptor positive status [ER+]: Secondary | ICD-10-CM | POA: Diagnosis not present

## 2021-04-11 DIAGNOSIS — D0512 Intraductal carcinoma in situ of left breast: Secondary | ICD-10-CM | POA: Diagnosis not present

## 2021-04-11 DIAGNOSIS — Z510 Encounter for antineoplastic radiation therapy: Secondary | ICD-10-CM | POA: Diagnosis not present

## 2021-04-13 ENCOUNTER — Other Ambulatory Visit: Payer: Self-pay | Admitting: Adult Health

## 2021-04-13 DIAGNOSIS — Z170 Estrogen receptor positive status [ER+]: Secondary | ICD-10-CM | POA: Diagnosis not present

## 2021-04-13 DIAGNOSIS — Z510 Encounter for antineoplastic radiation therapy: Secondary | ICD-10-CM | POA: Diagnosis not present

## 2021-04-13 DIAGNOSIS — D0512 Intraductal carcinoma in situ of left breast: Secondary | ICD-10-CM | POA: Diagnosis not present

## 2021-04-13 DIAGNOSIS — Z760 Encounter for issue of repeat prescription: Secondary | ICD-10-CM

## 2021-04-14 ENCOUNTER — Ambulatory Visit
Admission: RE | Admit: 2021-04-14 | Discharge: 2021-04-14 | Disposition: A | Discharge status: Home / Self Care | Payer: Medicare Other | Source: Ambulatory Visit | Attending: Radiation Oncology | Admitting: Radiation Oncology

## 2021-04-14 ENCOUNTER — Other Ambulatory Visit: Payer: Self-pay

## 2021-04-14 DIAGNOSIS — D0512 Intraductal carcinoma in situ of left breast: Secondary | ICD-10-CM | POA: Diagnosis not present

## 2021-04-14 DIAGNOSIS — Z510 Encounter for antineoplastic radiation therapy: Secondary | ICD-10-CM | POA: Diagnosis not present

## 2021-04-14 DIAGNOSIS — Z170 Estrogen receptor positive status [ER+]: Secondary | ICD-10-CM | POA: Diagnosis not present

## 2021-04-15 ENCOUNTER — Ambulatory Visit: Payer: Medicare Other | Admitting: Radiation Oncology

## 2021-04-15 ENCOUNTER — Ambulatory Visit
Admission: RE | Admit: 2021-04-15 | Discharge: 2021-04-15 | Disposition: A | Discharge status: Home / Self Care | Payer: Medicare Other | Source: Ambulatory Visit | Attending: Radiation Oncology | Admitting: Radiation Oncology

## 2021-04-15 DIAGNOSIS — D0512 Intraductal carcinoma in situ of left breast: Secondary | ICD-10-CM | POA: Diagnosis not present

## 2021-04-15 DIAGNOSIS — Z170 Estrogen receptor positive status [ER+]: Secondary | ICD-10-CM | POA: Diagnosis not present

## 2021-04-15 DIAGNOSIS — Z510 Encounter for antineoplastic radiation therapy: Secondary | ICD-10-CM | POA: Diagnosis not present

## 2021-04-16 ENCOUNTER — Ambulatory Visit: Payer: Medicare Other

## 2021-04-16 ENCOUNTER — Other Ambulatory Visit: Payer: Self-pay

## 2021-04-16 ENCOUNTER — Ambulatory Visit
Admission: RE | Admit: 2021-04-16 | Discharge: 2021-04-16 | Disposition: A | Discharge status: Home / Self Care | Payer: Medicare Other | Source: Ambulatory Visit | Attending: Radiation Oncology | Admitting: Radiation Oncology

## 2021-04-16 DIAGNOSIS — D0512 Intraductal carcinoma in situ of left breast: Secondary | ICD-10-CM | POA: Insufficient documentation

## 2021-04-16 DIAGNOSIS — Z170 Estrogen receptor positive status [ER+]: Secondary | ICD-10-CM | POA: Insufficient documentation

## 2021-04-16 DIAGNOSIS — Z510 Encounter for antineoplastic radiation therapy: Secondary | ICD-10-CM | POA: Diagnosis not present

## 2021-04-16 DIAGNOSIS — M25571 Pain in right ankle and joints of right foot: Secondary | ICD-10-CM | POA: Diagnosis not present

## 2021-04-17 ENCOUNTER — Ambulatory Visit
Admission: RE | Admit: 2021-04-17 | Discharge: 2021-04-17 | Disposition: A | Discharge status: Home / Self Care | Payer: Medicare Other | Source: Ambulatory Visit | Attending: Radiation Oncology | Admitting: Radiation Oncology

## 2021-04-17 DIAGNOSIS — D0512 Intraductal carcinoma in situ of left breast: Secondary | ICD-10-CM | POA: Diagnosis not present

## 2021-04-17 DIAGNOSIS — Z170 Estrogen receptor positive status [ER+]: Secondary | ICD-10-CM | POA: Diagnosis not present

## 2021-04-17 DIAGNOSIS — Z510 Encounter for antineoplastic radiation therapy: Secondary | ICD-10-CM | POA: Diagnosis not present

## 2021-04-18 ENCOUNTER — Ambulatory Visit
Admission: RE | Admit: 2021-04-18 | Discharge: 2021-04-18 | Disposition: A | Discharge status: Home / Self Care | Payer: Medicare Other | Source: Ambulatory Visit | Attending: Radiation Oncology | Admitting: Radiation Oncology

## 2021-04-18 ENCOUNTER — Other Ambulatory Visit: Payer: Self-pay

## 2021-04-18 ENCOUNTER — Ambulatory Visit (INDEPENDENT_AMBULATORY_CARE_PROVIDER_SITE_OTHER): Payer: Medicare Other

## 2021-04-18 VITALS — BP 122/72 | Ht 66.0 in | Wt 226.0 lb

## 2021-04-18 DIAGNOSIS — Z170 Estrogen receptor positive status [ER+]: Secondary | ICD-10-CM | POA: Diagnosis not present

## 2021-04-18 DIAGNOSIS — L4050 Arthropathic psoriasis, unspecified: Secondary | ICD-10-CM | POA: Diagnosis not present

## 2021-04-18 DIAGNOSIS — Z6836 Body mass index (BMI) 36.0-36.9, adult: Secondary | ICD-10-CM | POA: Diagnosis not present

## 2021-04-18 DIAGNOSIS — E669 Obesity, unspecified: Secondary | ICD-10-CM | POA: Diagnosis not present

## 2021-04-18 DIAGNOSIS — Z0000 Encounter for general adult medical examination without abnormal findings: Secondary | ICD-10-CM

## 2021-04-18 DIAGNOSIS — D0512 Intraductal carcinoma in situ of left breast: Secondary | ICD-10-CM | POA: Diagnosis not present

## 2021-04-18 DIAGNOSIS — M25511 Pain in right shoulder: Secondary | ICD-10-CM | POA: Diagnosis not present

## 2021-04-18 DIAGNOSIS — Z510 Encounter for antineoplastic radiation therapy: Secondary | ICD-10-CM | POA: Diagnosis not present

## 2021-04-18 DIAGNOSIS — M5136 Other intervertebral disc degeneration, lumbar region: Secondary | ICD-10-CM | POA: Diagnosis not present

## 2021-04-18 DIAGNOSIS — M8580 Other specified disorders of bone density and structure, unspecified site: Secondary | ICD-10-CM | POA: Diagnosis not present

## 2021-04-18 DIAGNOSIS — M5030 Other cervical disc degeneration, unspecified cervical region: Secondary | ICD-10-CM | POA: Diagnosis not present

## 2021-04-18 DIAGNOSIS — G5622 Lesion of ulnar nerve, left upper limb: Secondary | ICD-10-CM | POA: Diagnosis not present

## 2021-04-18 DIAGNOSIS — M150 Primary generalized (osteo)arthritis: Secondary | ICD-10-CM | POA: Diagnosis not present

## 2021-04-18 DIAGNOSIS — L401 Generalized pustular psoriasis: Secondary | ICD-10-CM | POA: Diagnosis not present

## 2021-04-18 NOTE — Progress Notes (Addendum)
Subjective:   Tonya Bartlett is a 76 y.o. female who presents for Medicare Annual (Subsequent) preventive examination.  Review of Systems    Virtual Visit via Telephone Note  I connected with  Tonya Bartlett on 04/18/21 at  3:30 PM EST by telephone and verified that I am speaking with the correct person using two identifiers.  Location: Patient: Home Provider: Office Persons participating in the virtual visit: patient/Nurse Health Advisor   I discussed the limitations, risks, security and privacy concerns of performing an evaluation and management service by telephone and the availability of in person appointments. The patient expressed understanding and agreed to proceed.  Interactive audio and video telecommunications were attempted between this nurse and patient, however failed, due to patient having technical difficulties OR patient did not have access to video capability.  We continued and completed visit with audio only.  Some vital signs may be absent or patient reported.   Criselda Peaches, LPN  Cardiac Risk Factors include: advanced age (>33men, >62 women);hypertension     Objective:    Today's Vitals   04/18/21 1525  BP: 122/72  Weight: 226 lb (102.5 kg)  Height: 5\' 6"  (1.676 m)   Body mass index is 36.48 kg/m.  Advanced Directives 04/18/2021 03/19/2021 02/12/2021 12/20/2020 08/01/2020 07/31/2020 07/31/2020  Does Patient Have a Medical Advance Directive? Yes Yes Yes Yes Yes No No  Type of Paramedic of [place];Living will [place];Living will [place];Living will [place];Living will [place];Living will [place];Living will -  Does patient want to make changes to medical advance directive? No - Patient declined - No - Patient declined No - Patient declined No - Guardian declined No - Patient declined -  Copy of [place] in  Chart? No - copy requested No - copy requested No - copy requested - No - copy requested No - copy requested -  Would patient like information on creating a medical advance directive? - - - - No - Patient declined No - Patient declined -    Current Medications (verified) Outpatient Encounter Medications as of 04/18/2021  Medication Sig   acetaminophen (TYLENOL) 325 MG tablet Take 650 mg by mouth every 6 (six) hours as needed for moderate pain.   Antiseborrheic Products, Misc. (PROMISEB) CREA Uses topical daily as needed on her face   apixaban (ELIQUIS) 5 MG TABS tablet Take 1 tablet (5 mg total) by mouth 2 (two) times daily.   atorvastatin (LIPITOR) 20 MG tablet Take 1 tablet (20 mg total) by mouth daily.   betamethasone dipropionate 0.05 % lotion APPLY TO AFFECTED AREA EVERY DAY   escitalopram (LEXAPRO) 10 MG tablet TAKE 1 AND 1/2 TABLETS(15 MG) BY MOUTH DAILY (Patient not taking: Reported on 04/18/2021)   famotidine (PEPCID) 20 MG tablet Take 1 tablet (20 mg total) by mouth daily as needed for heartburn or indigestion. (Patient taking differently: Take 20 mg by mouth daily.)   FIBER PO Take 1 capsule by mouth in the morning and at bedtime.   HYDROcodone-acetaminophen (NORCO/VICODIN) 5-325 MG tablet 1 tablet as needed   hydrocortisone cream 1 % Apply 1 application topically daily as needed for itching.   L-Methylfolate-B12-B6-B2 (CEREFOLIN) 07-17-48-5 MG TABS TAKE 1 TABLET BY MOUTH EVERY DAY   losartan (COZAAR) 100 MG tablet Take 100 mg by mouth daily.   metoprolol tartrate (LOPRESSOR) 25 MG tablet Take 0.5 tablets (12.5 mg total) by  mouth 2 (two) times daily.   omeprazole (PRILOSEC) 40 MG capsule Take 1 capsule (40 mg total) by mouth daily.   rizatriptan (MAXALT) 10 MG tablet Take 1 tablet (10 mg total) by mouth as needed for migraine. May repeat in 2 hours if needed   sulfamethoxazole-trimethoprim (BACTRIM,SEPTRA) 400-80 MG tablet Take 1 tablet by mouth daily. For urethritis.   traZODone  (DESYREL) 50 MG tablet TAKE 1 TABLET(50 MG) BY MOUTH AT BEDTIME   venlafaxine XR (EFFEXOR XR) 37.5 MG 24 hr capsule Take 1 capsule (37.5 mg total) by mouth daily with breakfast.   No facility-administered encounter medications on file as of 04/18/2021.    Allergies (verified) Otezla [apremilast], Penicillins, Erythromycin, Morphine and related, Nsaids, Remicade [infliximab], Tolmetin, and Nickel   History: Past Medical History:  Diagnosis Date   Allergic rhinitis    Allergy    Anemia    Anxiety    Blood transfusion without reported diagnosis    Breast cancer ([place]) 12/2020   left   Cataract    bil cataracts removed   Clotting disorder ([place])    DVT- 1996 po knee replacement   Colitis    Complication of anesthesia    vomit x1 while on Morphine per pt   Depression    Diverticulosis    Dysrhythmia    Atrial fibrillation   Frequency of urination    GERD (gastroesophageal reflux disease)    History of colon polyps    History of DVT of lower extremity    POST LEFT TOTAL KNEE  1996   History of hiatal hernia    History of iron deficiency anemia 1996   iron infusion   History of migraine headaches    History of rib fracture    Hyperlipidemia    Hypertension    IBS (irritable bowel syndrome)    Insomnia    MCI (mild cognitive impairment)    Melanoma ([place]) 2019   left leg    Migraine headache    hx of migraines    OA (osteoarthritis)    RIGHT SHOULDER   Osteopenia    Pneumonia    remote history   PONV (postoperative nausea and vomiting)    Recovering alcoholic ([place])    SINCE [id number]   Right rotator cuff tear    Substance abuse ([place])    recovering alcoholic   Unspecified essential hypertension    Past Surgical History:  Procedure Laterality Date   BREAST BIOPSY Left 01/06/2021   pos   BREAST LUMPECTOMY WITH RADIOACTIVE SEED LOCALIZATION Left 02/27/2021   Procedure: LEFT BREAST LUMPECTOMY WITH RADIOACTIVE SEED LOCALIZATION;  Surgeon: [name], MD;  Location:  [facility];  Service: General;  Laterality: Left;   BUNIONECTOMY/  HAMMERTOE CORRECTION  RIGHT FOOT  2011   CATARACT EXTRACTION W/ INTRAOCULAR LENS  IMPLANT, BILATERAL     COLONOSCOPY     DILATION AND CURETTAGE OF UTERUS  1976   EYE SURGERY Bilateral 2016   cataract removal   KNEE ARTHROSCOPY W/ MENISCECTOMY Bilateral X2  LEFT /    X1  RIGHT   KNEE OPEN LATERAL RELEASE Bilateral    MOHS SURGERY Left 12/15/2017   [facility] situ - [facility]   ORIF ANKLE FRACTURE Right 08/04/2020   Procedure: OPEN REDUCTION INTERNAL FIXATION (ORIF) ANKLE FRACTURE;  Surgeon: Wylene Simmer, MD;  Location: [facility];  Service: Orthopedics;  Laterality: Right;  Mini C-arm, [employer] Small Frag   REPLACEMENT TOTAL KNEE Left 2006  SHOULDER ARTHROSCOPY WITH SUBACROMIAL DECOMPRESSION, ROTATOR CUFF REPAIR AND BICEP TENDON REPAIR Right 05/23/2013   Procedure: RIGHT SHOULDER ARTHROSCOPY EXAM UNDER ANESTHESIA  WITH SUBACROMIAL DECOMPRESSION,DISTAL CLAVICLE RESECTION, SADLABRAL DEBRIDEMENT CHONDROPLASTY, BICEP TENOTOMY ;  Surgeon: Sydnee Cabal, MD;  Location: [facility];  Service: Orthopedics;  Laterality: Right;   TONSILLECTOMY AND ADENOIDECTOMY  AGE 27   TOTAL HIP ARTHROPLASTY Left 05/04/2017   Procedure: LEFT TOTAL HIP ARTHROPLASTY ANTERIOR APPROACH;  Surgeon: Paralee Cancel, MD;  Location: [facility];  Service: Orthopedics;  Laterality: Left;   TOTAL HIP ARTHROPLASTY Right 08/01/2019   Procedure: TOTAL HIP ARTHROPLASTY ANTERIOR APPROACH;  Surgeon: Paralee Cancel, MD;  Location: [facility];  Service: Orthopedics;  Laterality: Right;  21mins   TOTAL KNEE ARTHROPLASTY Bilateral LEFT  1996/   RIGHT 2004   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      UPPER GASTROINTESTINAL ENDOSCOPY     UPPER GI ENDOSCOPY     VAGINAL HYSTERECTOMY  1976   Family History  Problem Relation Age of Onset   Melanoma Mother 67   Heart disease Father 94   Hypertension Father    Esophageal cancer Brother    Dementia  Paternal Grandfather    Melanoma Niece 51   Colon cancer Neg Hx    Colon polyps Neg Hx    Stomach cancer Neg Hx    Rectal cancer Neg Hx    Social History   Socioeconomic History   Marital status: Widowed    Spouse name: Not on file   Number of children: 3   Years of education: Not on file   Highest education level: Master's degree (e.g., MA, MS, MEng, MEd, MSW, MBA)  Occupational History   Occupation: retired Transport planner  Tobacco Use   Smoking status: Former    Packs/day: 0.50    Years: 15.00    Pack years: 7.50    Types: Cigarettes    Quit date: 05/15/1985    Years since quitting: 35.9   Smokeless tobacco: Never  Vaping Use   Vaping Use: Never used  Substance and Sexual Activity   Alcohol use: No    Comment: RECOVERING ALCOHOLIC--  QUIT [id number]   Drug use: No   Sexual activity: Not Currently    Birth control/protection: Post-menopausal  Other Topics Concern   Not on file  Social History Narrative   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    Widowed    Three kids    71 grandchildren       Social Determinants of Radio broadcast assistant Strain: Low Risk    Difficulty of Paying Living Expenses: Not hard at all  Food Insecurity: No Food Insecurity   Worried About Charity fundraiser in the Last Year: Never true   Arboriculturist in the Last Year: Never true  Transportation Needs: No Transportation Needs   Lack of Transportation (Medical): No   Lack of Transportation (Non-Medical): No  Physical Activity: Inactive   Days of Exercise per Week: 0 days   Minutes of Exercise per Session: 0 min  Stress: No Stress Concern Present   Feeling of Stress : Not at all  Social Connections: Moderately Integrated   Frequency of Communication with Friends and Family: More than three times a week   Frequency of Social Gatherings with Friends and Family: More than three times a week   Attends Religious Services: More than 4 times per year    Active Member of [employer]  or Organizations: Yes   Attends Archivist Meetings: More than 4 times per year   Marital Status: Widowed     Clinical Intake:   Diabetic?  No   Activities of Daily Living In your present state of health, do you have any difficulty performing the following activities: 04/18/2021 02/12/2021  Hearing? N N  Comment - -  Vision? N N  Difficulty concentrating or making decisions? N N  Walking or climbing stairs? N Y  Comment - R ankle pain  Dressing or bathing? N N  Doing errands, shopping? N N  Preparing Food and eating ? N -  Using the Toilet? N -  In the past six months, have you accidently leaked urine? N -  Do you have problems with loss of bowel control? N -  Managing your Medications? N -  Some recent data might be hidden    Patient Care Team: [name], NP as PCP - General (Family Medicine) [name], MD as PCP - Cardiology (Cardiology) [name], MD (Inactive) as Consulting Physician (Urology) Ortho, [facility] (Specialist) [name], [facility] as Pharmacist (Pharmacist) [name], RN as Oncology Nurse Navigator [name], RN as Oncology Nurse Navigator [name], MD as Consulting Physician (General Surgery) [name], MD as Consulting Physician (Hematology and Oncology) [name], MD as Consulting Physician (Radiation Oncology)  Indicate any recent Medical Services you may have received from other than [facility] providers in the past year (date may be approximate).     Assessment:   This is a routine wellness examination for Tonya Bartlett.  Hearing/Vision screen Hearing Screening - Comments:: Wars hearing aids Vision Screening - Comments:: Wears reading glasses. Followed By Dr Benjamine Mola  Dietary issues and exercise activities discussed: Exercise limited by: None identified   Goals Addressed               This Visit's Progress     Patient Stated (pt-stated)        Lose weight .        Depression Screen PHQ 2/9 Scores 04/18/2021 04/10/2020 11/23/2017 12/24/2015 10/13/2013  PHQ - 2 Score 0 1 2 0 0  Some encounter information is confidential and restricted. Go to Review Flowsheets activity to see all data.    Fall Risk Fall Risk  04/18/2021 04/10/2020 09/26/2019 01/18/2019 01/11/2019  Falls in the past year? 0 1 1 1 1   Comment - - - - Emmi Telephone Survey: data to providers prior to load  Number falls in past yr: 0 1 0 1 1  Comment - - - - Emmi Telephone Survey Actual Response = 3  Injury with Fall? 0 0 0 - 0  Risk for fall due to : No Fall Risks Impaired vision;Impaired balance/gait - - -  Follow up - Falls prevention discussed - - -    FALL RISK PREVENTION PERTAINING TO THE HOME:  Any stairs in or around the home? Yes  If so, are there any without handrails? No  Home free of loose throw rugs in walkways, pet beds, electrical cords, etc? Yes  Adequate lighting in your home to reduce risk of falls? Yes   ASSISTIVE DEVICES UTILIZED TO PREVENT FALLS:  Life alert? No  Use of a cane, walker or w/c? Yes  Grab bars in the bathroom? Yes  Shower chair or bench in shower? Yes  Elevated toilet seat or a handicapped toilet? Yes   TIMED UP AND GO:  Was the test performed? No . Audio  Visit  Cognitive Function: MMSE - Mini Mental State Exam 12/19/2020 09/26/2019 08/30/2017 06/08/2016  Orientation to time 5 5 5 5   Orientation to Place 5 4 5 5   Registration 3 3 3 3   Attention/ Calculation 5 5 5 5   Recall 3 3 3 3   Language- name 2 objects 2 2 2 2   Language- repeat 1 1 1 1   Language- follow 3 step command 3 3 3 3   Language- read & follow direction 1 1 1 1   Write a sentence 1 1 1 1   Copy design 1 1 1 1   Total score 30 29 30 30      6CIT Screen 04/18/2021 04/10/2020  What Year? 0 points 0 points  What month? 0 points 0 points  What time? 0 points -  Count back from 20 0 points 0 points  Months in reverse 0 points 0 points  Repeat phrase 0 points 2 points  Total Score 0 -     Immunizations Immunization History  Administered Date(s) Administered   Fluad Quad(high Dose 65+) 10/31/2018   Influenza Split 11/17/2011   Influenza Whole 10/15/2009   Influenza, High Dose Seasonal PF 01/02/2014, 11/06/2014, 11/15/2015, 11/05/2016, 12/02/2017   Influenza-Unspecified 10/17/2012   PFIZER(Purple Top)SARS-COV-2 Vaccination 03/25/2019, 04/18/2019, 11/03/2019   Pneumococcal Conjugate-13 01/02/2014   Pneumococcal Polysaccharide-23 10/15/2009   Td 08/24/2006   Tdap 07/31/2020   Zoster Recombinat (Shingrix) 12/07/2018, 02/15/2019   Zoster, Live 02/23/2015    TDAP status: Up to date  Flu Vaccine status: Up to date  Pneumococcal vaccine status: Up to date  Covid-19 vaccine status: Completed vaccines  Qualifies for Shingles Vaccine? Yes   Zostavax completed No   Shingrix Completed?: Yes  Screening Tests Health Maintenance  Topic Date Due   COVID-19 Vaccine (4 - Booster for Pfizer series) 05/04/2021 (Originally 12/29/2019)   COLONOSCOPY (Pts 45-68yrs Insurance coverage will need to be confirmed)  04/19/2022 (Originally 03/15/2021)   TETANUS/TDAP  08/01/2030   Pneumonia Vaccine 65+ Years old  Completed   INFLUENZA VACCINE  Completed   DEXA SCAN  Completed   Hepatitis C Screening  Completed   Zoster Vaccines- Shingrix  Completed   HPV VACCINES  Aged Out    Health Maintenance  There are no preventive care reminders to display for this patient.   Colorectal cancer screening: Referral to GI placed Deferred. Pt aware the office will call re: appt.Patient deferred   Bone Density status: Completed 01/01/21. Results reflect: Bone density results: OSTEOPOROSIS. Repeat every 2 years.  Lung Cancer Screening: (Low Dose CT Chest recommended if Age 7-80 years, 30 pack-year currently smoking OR have quit w/in 15years.) does not qualify.     Additional Screening:  Hepatitis C Screening: does qualify; Completed 11/05/16  Vision Screening: Recommended annual  ophthalmology exams for early detection of glaucoma and other disorders of the eye. Is the patient up to date with their annual eye exam?  Yes  Who is the provider or what is the name of the office in which the patient attends annual eye exams? Dr Leonarda Salon If pt is not established with a provider, would they like to be referred to a provider to establish care? No .   Dental Screening: Recommended annual dental exams for proper oral hygiene  Community Resource Referral / Chronic Care Management:  CRR required this visit?  No   CCM required this visit?  No      Plan:     I have personally reviewed and noted the following in the patients  chart:   Medical and social history Use of alcohol, tobacco or illicit drugs  Current medications and supplements including opioid prescriptions. Patient currently taking opioids. Functional ability and status Nutritional status Physical activity Advanced directives List of other physicians Hospitalizations, surgeries, and ER visits in previous 12 months Vitals Screenings to include cognitive, depression, and falls Referrals and appointments  In addition, I have reviewed and discussed with patient certain preventive protocols, quality metrics, and best practice recommendations. A written personalized care plan for preventive services as well as general preventive health recommendations were provided to patient.     Criselda Peaches, LPN   [id number]   Nurse Notes: Patient request f/u to be advised and prescribed weight loss medication.

## 2021-04-18 NOTE — Progress Notes (Signed)
? ?Patient Care Team: ?[name], NP as PCP - General (Family Medicine) ?[name], MD as PCP - Cardiology (Cardiology) ?[name], MD (Inactive) as Consulting Physician (Urology) ?Or[facility], [facility] (Specialist) ?[name], [facility] as Pharmacist (Pharmacist) ?[name], RN as Oncology Nurse Navigator ?[name], RN as Oncology Nurse Navigator ?[name], MD as Consulting Physician (General Surgery) ?[name], MD as Consulting Physician (Hematology and Oncology) ?[name], MD as Consulting Physician (Radiation Oncology) ? ?DIAGNOSIS:  ?  ICD-10-CM   ?1. Ductal carcinoma in situ (DCIS) of left breast  D05.12   ?  ? ? ?SUMMARY OF ONCOLOGIC HISTORY: ?Oncology History  ?Ductal carcinoma in situ (DCIS) of left breast  ?01/06/2021 Initial Diagnosis  ? Diagnostic mammogram: indeterminate calcifications in the upper outer left breast. Biopsy: DCIS and necrosis and calcifications, ER+(100%)/PR+(70%).  ?  ?01/15/2021 Cancer Staging  ? Staging form: Breast, AJCC 8th Edition ?- Clinical stage from 01/15/2021: Stage 0 (cTis (DCIS), cN0, cM0, ER+, PR+, HER2: Not Assessed) - Signed by [name], MD on 01/15/2021 ?Stage prefix: Initial diagnosis ? ?  ? Genetic Testing  ? [facility] CancerNext-Expanded Panel is Negative. Report date is 01/28/2021. ? ?The CancerNext-Expanded gene panel offered by [facility] and includes sequencing, rearrangement, and RNA analysis for the following 77 genes: AIP, ALK, APC, ATM, AXIN2, BAP1, BARD1, BLM, BMPR1A, BRCA1, BRCA2, BRIP1, CDC73, CDH1, CDK4, CDKN1B, CDKN2A, CHEK2, CTNNA1, DICER1, FANCC, FH, FLCN, GALNT12, KIF1B, LZTR1, MAX, MEN1, MET, MLH1, MSH2, MSH3, MSH6, MUTYH, NBN, NF1, NF2, NTHL1, PALB2, PHOX2B, PMS2, POT1, PRKAR1A, PTCH1, PTEN, RAD51C, RAD51D, RB1, RECQL, RET, SDHA, SDHAF2, SDHB, SDHC, SDHD, SMAD4, SMARCA4, SMARCB1, SMARCE1, STK11, SUFU, TMEM127, TP53, TSC1, TSC2, VHL and XRCC2 (sequencing and deletion/duplication); EGFR, EGLN1, HOXB13,  KIT, MITF, PDGFRA, POLD1, and POLE (sequencing only); EPCAM and GREM1 (deletion/duplication only).  ?  ?03/28/2021 - 04/23/2021 Radiation Therapy  ? Adjuvant radiation ?  ?04/30/2021 -  Anti-estrogen oral therapy  ? Tamoxifen started 5 mg daily ?  ? ? ?CHIEF COMPLIANT: Follow-up of left breast DCIS ? ?INTERVAL HISTORY: Tonya Bartlett is a 76 y.o. with above-mentioned history of left breast DCIS having undergone lumpectomy, currently on radiation therapy. She presents to the clinic today for follow-up.  She has radiation dermatitis but she appears to be handling it fairly well. ? ?ALLERGIES:  is allergic to otezla [apremilast], penicillins, erythromycin, morphine and related, nsaids, remicade [infliximab], tolmetin, and nickel. ? ?MEDICATIONS:  ?Current Outpatient Medications  ?Medication Sig Dispense Refill  ? acetaminophen (TYLENOL) 325 MG tablet Take 650 mg by mouth every 6 (six) hours as needed for moderate pain.    ? Antiseborrheic Products, Misc. (PROMISEB) CREA Uses topical daily as needed on her face    ? apixaban (ELIQUIS) 5 MG TABS tablet Take 1 tablet (5 mg total) by mouth 2 (two) times daily. 180 tablet 1  ? atorvastatin (LIPITOR) 20 MG tablet Take 1 tablet (20 mg total) by mouth daily. 90 tablet 3  ? betamethasone dipropionate 0.05 % lotion APPLY TO AFFECTED AREA EVERY DAY 60 mL 1  ? escitalopram (LEXAPRO) 10 MG tablet TAKE 1 AND 1/2 TABLETS(15 MG) BY MOUTH DAILY (Patient not taking: Reported on 04/18/2021) 135 tablet 0  ? famotidine (PEPCID) 20 MG tablet Take 1 tablet (20 mg total) by mouth daily as needed for heartburn or indigestion. (Patient taking differently: Take 20 mg by mouth daily.) 90 tablet 3  ? FIBER PO Take 1 capsule by mouth in the morning and at bedtime.    ?  HYDROcodone-acetaminophen (NORCO/VICODIN) 5-325 MG tablet 1 tablet as needed    ? hydrocortisone cream 1 % Apply 1 application topically daily as needed for itching.    ? L-Methylfolate-B12-B6-B2 (CEREFOLIN) 07-17-48-5 MG TABS TAKE 1 TABLET  BY MOUTH EVERY DAY 90 tablet 0  ? losartan (COZAAR) 100 MG tablet Take 100 mg by mouth daily.    ? metoprolol tartrate (LOPRESSOR) 25 MG tablet Take 0.5 tablets (12.5 mg total) by mouth 2 (two) times daily. 60 tablet 3  ? omeprazole (PRILOSEC) 40 MG capsule Take 1 capsule (40 mg total) by mouth daily. 30 capsule 5  ? rizatriptan (MAXALT) 10 MG tablet Take 1 tablet (10 mg total) by mouth as needed for migraine. May repeat in 2 hours if needed 10 tablet 2  ? sulfamethoxazole-trimethoprim (BACTRIM,SEPTRA) 400-80 MG tablet Take 1 tablet by mouth daily. For urethritis.    ? traZODone (DESYREL) 50 MG tablet TAKE 1 TABLET(50 MG) BY MOUTH AT BEDTIME 90 tablet 1  ? venlafaxine XR (EFFEXOR XR) 37.5 MG 24 hr capsule Take 1 capsule (37.5 mg total) by mouth daily with breakfast. 30 capsule 0  ? ?No current facility-administered medications for this visit.  ? ? ?PHYSICAL EXAMINATION: ?ECOG PERFORMANCE STATUS: 1 - Symptomatic but completely ambulatory ? ?Vitals:  ? 04/21/21 1050  ?BP: (!) 153/82  ?Pulse: 87  ?Resp: 18  ?Temp: (!) 97.5 ?F (36.4 ?C)  ?SpO2: 97%  ? ?Filed Weights  ? 04/21/21 1050  ?Weight: 226 lb 14.4 oz (102.9 kg)  ? ? ?BREAST: Redness involving the left breast from radiation (exam performed in the presence of a chaperone) ? ?LABORATORY DATA:  ?I have reviewed the data as listed ?CMP Latest Ref Rng & Units 02/12/2021 01/15/2021 08/05/2020  ?Glucose 70 - 99 mg/dL 96 91 110(H)  ?BUN 8 - 23 mg/dL '13 11 13  ' ?Creatinine 0.44 - 1.00 mg/dL 0.64 0.65 0.59  ?Sodium 135 - 145 mmol/L 137 138 139  ?Potassium 3.5 - 5.1 mmol/L 3.6 3.7 3.6  ?Chloride 98 - 111 mmol/L 104 104 105  ?CO2 22 - 32 mmol/L '27 27 28  ' ?Calcium 8.9 - 10.3 mg/dL 9.0 9.2 8.9  ?Total Protein 6.5 - 8.1 g/dL - 7.4 -  ?Total Bilirubin 0.3 - 1.2 mg/dL - 0.9 -  ?Alkaline Phos 38 - 126 U/L - 68 -  ?AST 15 - 41 U/L - 22 -  ?ALT 0 - 44 U/L - 19 -  ? ? ?Lab Results  ?Component Value Date  ? WBC 6.9 02/12/2021  ? HGB 12.6 02/12/2021  ? HCT 38.6 02/12/2021  ? MCV 102.7 (H)  02/12/2021  ? PLT 268 02/12/2021  ? NEUTROABS 2.9 01/15/2021  ? ? ?ASSESSMENT & PLAN:  ?Ductal carcinoma in situ (DCIS) of left breast ?01/06/2021:Diagnostic mammogram: indeterminate calcifications in the upper outer left breast. Biopsy: DCIS and necrosis and calcifications, ER+(100%)/PR+(70%).  ?  ?02/27/2021: Left lumpectomy: Grade 2 DCIS 0.8 cm, margins negative, ER 100%, PR 70% ?   ?Treatment plan: ?1. adjuvant radiation therapy to be completed 04/25/2021 ?2. Followed by antiestrogen therapy with tamoxifen ?5 years started 04/30/2021 ?  ?We discussed risk and benefits of tamoxifen.  We will start her at 5 mg dosage.  Based on the may need time regimen this is just as equivalent to the full 20 mg dosage. ?  ?Return to clinic in 3 months for survivorship care plan visit ? ? ? ?No orders of the defined types were placed in this encounter. ? ?The patient has a good  understanding of the overall plan. she agrees with it. she will call with any problems that may develop before the next visit here. ? ?Total time spent: 30 mins including face to face time and time spent for planning, charting and coordination of care ? ?Rulon Eisenmenger, MD, MPH ?04/21/2021 ? ?I, Thana Ates, am acting as scribe for Dr. [name]. ? ?I have reviewed the above documentation for accuracy and completeness, and I agree with the above. ? ? ? ? ? ? ?

## 2021-04-18 NOTE — Patient Instructions (Addendum)
Tonya Bartlett , Thank you for taking time to come for your Medicare Wellness Visit. I appreciate your ongoing commitment to your health goals. Please review the following plan we discussed and let me know if I can assist you in the future.   These are the goals we discussed:  Goals       Patient Stated (pt-stated)      Lose weight .        This is a list of the screening recommended for you and due dates:  Health Maintenance  Topic Date Due   COVID-19 Vaccine (4 - Booster for Pfizer series) 05/04/2021*   Colon Cancer Screening  04/19/2022*   Tetanus Vaccine  08/01/2030   Pneumonia Vaccine  Completed   Flu Shot  Completed   DEXA scan (bone density measurement)  Completed   Hepatitis C Screening: USPSTF Recommendation to screen - Ages 19-79 yo.  Completed   Zoster (Shingles) Vaccine  Completed   HPV Vaccine  Aged Out  *Topic was postponed. The date shown is not the original due date.   Opioid Pain Medicine Management Opioids are powerful medicines that are used to treat moderate to severe pain. When used for short periods of time, they can help you to: Sleep better. Do better in physical or occupational therapy. Feel better in the first few days after an injury. Recover from surgery. Opioids should be taken with the supervision of a trained health care provider. They should be taken for the shortest period of time possible. This is because opioids can be addictive, and the longer you take opioids, the greater your risk of addiction. This addiction can also be called opioid use disorder. What are the risks? Using opioid pain medicines for longer than 3 days increases your risk of side effects. Side effects include: Constipation. Nausea and vomiting. Breathing difficulties (respiratory depression). Drowsiness. Confusion. Opioid use disorder. Itching. Taking opioid pain medicine for a long period of time can affect your ability to do daily tasks. It also puts you at risk  for: Motor vehicle crashes. Depression. Suicide. Heart attack. Overdose, which can be life-threatening. What is a pain treatment plan? A pain treatment plan is an agreement between you and your health care provider. Pain is unique to each person, and treatments vary depending on your condition. To manage your pain, you and your health care provider need to work together. To help you do this: Discuss the goals of your treatment, including how much pain you might expect to have and how you will manage the pain. Review the risks and benefits of taking opioid medicines. Remember that a good treatment plan uses more than one approach and minimizes the chance of side effects. Be honest about the amount of medicines you take and about any drug or alcohol use. Get pain medicine prescriptions from only one health care provider. Pain can be managed with many types of alternative treatments. Ask your health care provider to refer you to one or more specialists who can help you manage pain through: Physical or occupational therapy. Counseling (cognitive behavioral therapy). Good nutrition. Biofeedback. Massage. Meditation. Non-opioid medicine. Following a gentle exercise program. How to use opioid pain medicine Taking medicine Take your pain medicine exactly as told by your health care provider. Take it only when you need it. If your pain gets less severe, you may take less than your prescribed dose if your health care provider approves. If you are not having pain, do nottake pain medicine unless your health  care provider tells you to take it. If your pain is severe, do nottry to treat it yourself by taking more pills than instructed on your prescription. Contact your health care provider for help. Write down the times when you take your pain medicine. It is easy to become confused while on pain medicine. Writing the time can help you avoid overdose. Take other over-the-counter or prescription  medicines only as told by your health care provider. Keeping yourself and others safe  While you are taking opioid pain medicine: Do not drive, use machinery, or power tools. Do not sign legal documents. Do not drink alcohol. Do not take sleeping pills. Do not supervise children by yourself. Do not do activities that require climbing or being in high places. Do not go to a lake, river, ocean, spa, or swimming pool. Do not share your pain medicine with anyone. Keep pain medicine in a locked cabinet or in a secure area where pets and children cannot reach it. Stopping your use of opioids If you have been taking opioid medicine for more than a few weeks, you may need to slowly decrease (taper) how much you take until you stop completely. Tapering your use of opioids can decrease your risk of symptoms of withdrawal, such as: Pain and cramping in the abdomen. Nausea. Sweating. Sleepiness. Restlessness. Uncontrollable shaking (tremors). Cravings for the medicine. Do not attempt to taper your use of opioids on your own. Talk with your health care provider about how to do this. Your health care provider may prescribe a step-down schedule based on how much medicine you are taking and how long you have been taking it. Getting rid of leftover pills Do not save any leftover pills. Get rid of leftover pills safely by: Taking the medicine to a prescription take-back program. This is usually offered by the county or law enforcement. Bringing them to a pharmacy that has a drug disposal container. Flushing them down the toilet. Check the label or package insert of your medicine to see whether this is safe to do. Throwing them out in the trash. Check the label or package insert of your medicine to see whether this is safe to do. If it is safe to throw it out, remove the medicine from the original container, put it into a sealable bag or container, and mix it with used coffee grounds, food scraps, dirt, or  cat litter before putting it in the trash. Follow these instructions at home: Activity Do exercises as told by your health care provider. Avoid activities that make your pain worse. Return to your normal activities as told by your health care provider. Ask your health care provider what activities are safe for you. General instructions You may need to take these actions to prevent or treat constipation: Drink enough fluid to keep your urine pale yellow. Take over-the-counter or prescription medicines. Eat foods that are high in fiber, such as beans, whole grains, and fresh fruits and vegetables. Limit foods that are high in fat and processed sugars, such as fried or sweet foods. Keep all follow-up visits. This is important. Where to find support If you have been taking opioids for a long time, you may benefit from receiving support for quitting from a local support group or counselor. Ask your health care provider for a referral to these resources in your area. Where to find more information Centers for Disease Control and Prevention (CDC): http://www.wolf.info/ [place] Food and Drug Administration (FDA): [employer] Get help right away if: You may  have taken too much of an opioid (overdosed). Common symptoms of an overdose: Your breathing is slower or more shallow than normal. You have a very slow heartbeat (pulse). You have slurred speech. You have nausea and vomiting. Your pupils become very small. You have other potential symptoms: You are very confused. You faint or feel like you will faint. You have cold, clammy skin. You have blue lips or fingernails. You have thoughts of harming yourself or harming others. These symptoms may represent a serious problem that is an emergency. Do not wait to see if the symptoms will go away. Get medical help right away. Call your local emergency services (911 in the [place] Do not drive yourself to the hospital.  If you ever feel like you may hurt yourself or  others, or have thoughts about taking your own life, get help right away. Go to your nearest emergency department or: Call your local emergency services (911 in the [place] Call the [facility] (506) 821-8206 in the [place] Call a suicide crisis helpline, such as the [facility] at 910-872-6766 or 988 in the [place]. This is open 24 hours a day in the [place] Text the Crisis Text Line at 276-180-3874 (in the [place].). Summary Opioid medicines can help you manage moderate to severe pain for a short period of time. A pain treatment plan is an agreement between you and your health care provider. Discuss the goals of your treatment, including how much pain you might expect to have and how you will manage the pain. If you think that you or someone else may have taken too much of an opioid, get medical help right away. This information is not intended to replace advice given to you by your health care provider. Make sure you discuss any questions you have with your health care provider. Document Revised: 08/28/2020 Document Reviewed: 05/15/2020 Elsevier Patient Education  [employer].    Advanced directives: Yes Copies on file  Conditions/risks identified: None  Next appointment: Follow up in one year for your annual wellness visit    Preventive Care 65 Years and Older, Female Preventive care refers to lifestyle choices and visits with your health care provider that can promote health and wellness. What does preventive care include? A yearly physical exam. This is also called an annual well check. Dental exams once or twice a year. Routine eye exams. Ask your health care provider how often you should have your eyes checked. Personal lifestyle choices, including: Daily care of your teeth and gums. Regular physical activity. Eating a healthy diet. Avoiding tobacco and drug use. Limiting alcohol use. Practicing safe sex. Taking low-dose aspirin every  day. Taking vitamin and mineral supplements as recommended by your health care provider. What happens during an annual well check? The services and screenings done by your health care provider during your annual well check will depend on your age, overall health, lifestyle risk factors, and family history of disease. Counseling  Your health care provider may ask you questions about your: Alcohol use. Tobacco use. Drug use. Emotional well-being. Home and relationship well-being. Sexual activity. Eating habits. History of falls. Memory and ability to understand (cognition). Work and work Statistician. Reproductive health. Screening  You may have the following tests or measurements: Height, weight, and BMI. Blood pressure. Lipid and cholesterol levels. These may be checked every 5 years, or more frequently if you are over 87 years old. Skin check. Lung cancer screening. You may have this screening every year  starting at age 39 if you have a 30-pack-year history of smoking and currently smoke or have quit within the past 15 years. Fecal occult blood test (FOBT) of the stool. You may have this test every year starting at age 67. Flexible sigmoidoscopy or colonoscopy. You may have a sigmoidoscopy every 5 years or a colonoscopy every 10 years starting at age 75. Hepatitis C blood test. Hepatitis B blood test. Sexually transmitted disease (STD) testing. Diabetes screening. This is done by checking your blood sugar (glucose) after you have not eaten for a while (fasting). You may have this done every 1-3 years. Bone density scan. This is done to screen for osteoporosis. You may have this done starting at age 64. Mammogram. This may be done every 1-2 years. Talk to your health care provider about how often you should have regular mammograms. Talk with your health care provider about your test results, treatment options, and if necessary, the need for more tests. Vaccines  Your health care  provider may recommend certain vaccines, such as: Influenza vaccine. This is recommended every year. Tetanus, diphtheria, and acellular pertussis (Tdap, Td) vaccine. You may need a Td booster every 10 years. Zoster vaccine. You may need this after age 41. Pneumococcal 13-valent conjugate (PCV13) vaccine. One dose is recommended after age 62. Pneumococcal polysaccharide (PPSV23) vaccine. One dose is recommended after age 45. Talk to your health care provider about which screenings and vaccines you need and how often you need them. This information is not intended to replace advice given to you by your health care provider. Make sure you discuss any questions you have with your health care provider. Document Released: 03/01/2015 Document Revised: 10/23/2015 Document Reviewed: 12/04/2014 Elsevier Interactive Patient Education  2017 [employer] Prevention in the Home Falls can cause injuries. They can happen to people of all ages. There are many things you can do to make your home safe and to help prevent falls. What can I do on the outside of my home? Regularly fix the edges of walkways and driveways and fix any cracks. Remove anything that might make you trip as you walk through a door, such as a raised step or threshold. Trim any bushes or trees on the path to your home. Use bright outdoor lighting. Clear any walking paths of anything that might make someone trip, such as rocks or tools. Regularly check to see if handrails are loose or broken. Make sure that both sides of any steps have handrails. Any raised decks and porches should have guardrails on the edges. Have any leaves, snow, or ice cleared regularly. Use sand or salt on walking paths during winter. Clean up any spills in your garage right away. This includes oil or grease spills. What can I do in the bathroom? Use night lights. Install grab bars by the toilet and in the tub and shower. Do not use towel bars as grab  bars. Use non-skid mats or decals in the tub or shower. If you need to sit down in the shower, use a plastic, non-slip stool. Keep the floor dry. Clean up any water that spills on the floor as soon as it happens. Remove soap buildup in the tub or shower regularly. Attach bath mats securely with double-sided non-slip rug tape. Do not have throw rugs and other things on the floor that can make you trip. What can I do in the bedroom? Use night lights. Make sure that you have a light by your bed that is  easy to reach. Do not use any sheets or blankets that are too big for your bed. They should not hang down onto the floor. Have a firm chair that has side arms. You can use this for support while you get dressed. Do not have throw rugs and other things on the floor that can make you trip. What can I do in the kitchen? Clean up any spills right away. Avoid walking on wet floors. Keep items that you use a lot in easy-to-reach places. If you need to reach something above you, use a strong step stool that has a grab bar. Keep electrical cords out of the way. Do not use floor polish or wax that makes floors slippery. If you must use wax, use non-skid floor wax. Do not have throw rugs and other things on the floor that can make you trip. What can I do with my stairs? Do not leave any items on the stairs. Make sure that there are handrails on both sides of the stairs and use them. Fix handrails that are broken or loose. Make sure that handrails are as long as the stairways. Check any carpeting to make sure that it is firmly attached to the stairs. Fix any carpet that is loose or worn. Avoid having throw rugs at the top or bottom of the stairs. If you do have throw rugs, attach them to the floor with carpet tape. Make sure that you have a light switch at the top of the stairs and the bottom of the stairs. If you do not have them, ask someone to add them for you. What else can I do to help prevent  falls? Wear shoes that: Do not have high heels. Have rubber bottoms. Are comfortable and fit you well. Are closed at the toe. Do not wear sandals. If you use a stepladder: Make sure that it is fully opened. Do not climb a closed stepladder. Make sure that both sides of the stepladder are locked into place. Ask someone to hold it for you, if possible. Clearly mark and make sure that you can see: Any grab bars or handrails. First and last steps. Where the edge of each step is. Use tools that help you move around (mobility aids) if they are needed. These include: Canes. Walkers. Scooters. Crutches. Turn on the lights when you go into a dark area. Replace any light bulbs as soon as they burn out. Set up your furniture so you have a clear path. Avoid moving your furniture around. If any of your floors are uneven, fix them. If there are any pets around you, be aware of where they are. Review your medicines with your doctor. Some medicines can make you feel dizzy. This can increase your chance of falling. Ask your doctor what other things that you can do to help prevent falls. This information is not intended to replace advice given to you by your health care provider. Make sure you discuss any questions you have with your health care provider. Document Released: 11/29/2008 Document Revised: 07/11/2015 Document Reviewed: 03/09/2014 Elsevier Interactive Patient Education  2017 [employer].

## 2021-04-19 DIAGNOSIS — U071 COVID-19: Secondary | ICD-10-CM | POA: Diagnosis not present

## 2021-04-19 DIAGNOSIS — Z23 Encounter for immunization: Secondary | ICD-10-CM | POA: Diagnosis not present

## 2021-04-21 ENCOUNTER — Ambulatory Visit
Admission: RE | Admit: 2021-04-21 | Discharge: 2021-04-21 | Disposition: A | Discharge status: Home / Self Care | Payer: Medicare Other | Source: Ambulatory Visit | Attending: Radiation Oncology | Admitting: Radiation Oncology

## 2021-04-21 ENCOUNTER — Inpatient Hospital Stay (HOSPITAL_BASED_OUTPATIENT_CLINIC_OR_DEPARTMENT_OTHER): Payer: Medicare Other | Admitting: Hematology and Oncology

## 2021-04-21 ENCOUNTER — Other Ambulatory Visit: Payer: Self-pay

## 2021-04-21 DIAGNOSIS — Z510 Encounter for antineoplastic radiation therapy: Secondary | ICD-10-CM | POA: Diagnosis not present

## 2021-04-21 DIAGNOSIS — D0512 Intraductal carcinoma in situ of left breast: Secondary | ICD-10-CM | POA: Insufficient documentation

## 2021-04-21 DIAGNOSIS — Z923 Personal history of irradiation: Secondary | ICD-10-CM | POA: Insufficient documentation

## 2021-04-21 DIAGNOSIS — Z170 Estrogen receptor positive status [ER+]: Secondary | ICD-10-CM | POA: Insufficient documentation

## 2021-04-21 MED ORDER — TAMOXIFEN CITRATE 10 MG PO TABS: 5.0000 mg | ORAL_TABLET | Freq: Two times a day (BID) | ORAL | 1 refills | Status: DC

## 2021-04-21 NOTE — Assessment & Plan Note (Signed)
01/06/2021:Diagnostic mammogram: indeterminate calcifications in the upper outer left breast. 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 ?? 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 ?Treatment plan: ?1. adjuvant radiation therapy to be completed 04/25/2021 ?2. Followed by antiestrogen therapy with tamoxifen ?5 years started 04/30/2021 ?We may start her at 5 mg because of her high sensitivity to medications. ?She talked about switching her antidepressant.  I discussed with her about switching to Effexor instead. ?? ?Return to clinic in 3 months for survivorship care plan visit ?

## 2021-04-22 ENCOUNTER — Ambulatory Visit
Admission: RE | Admit: 2021-04-22 | Discharge: 2021-04-22 | Disposition: A | Discharge status: Home / Self Care | Payer: Medicare Other | Source: Ambulatory Visit | Attending: Radiation Oncology | Admitting: Radiation Oncology

## 2021-04-22 ENCOUNTER — Encounter: Payer: Self-pay | Admitting: *Deleted

## 2021-04-22 DIAGNOSIS — Z510 Encounter for antineoplastic radiation therapy: Secondary | ICD-10-CM | POA: Diagnosis not present

## 2021-04-22 DIAGNOSIS — D0512 Intraductal carcinoma in situ of left breast: Secondary | ICD-10-CM | POA: Diagnosis not present

## 2021-04-22 DIAGNOSIS — Z170 Estrogen receptor positive status [ER+]: Secondary | ICD-10-CM | POA: Diagnosis not present

## 2021-04-23 ENCOUNTER — Ambulatory Visit
Admission: RE | Admit: 2021-04-23 | Discharge: 2021-04-23 | Disposition: A | Discharge status: Home / Self Care | Payer: Medicare Other | Source: Ambulatory Visit | Attending: Radiation Oncology | Admitting: Radiation Oncology

## 2021-04-23 ENCOUNTER — Encounter (HOSPITAL_COMMUNITY): Payer: Self-pay

## 2021-04-23 ENCOUNTER — Other Ambulatory Visit: Payer: Self-pay

## 2021-04-23 ENCOUNTER — Encounter: Payer: Self-pay | Admitting: Radiation Oncology

## 2021-04-23 DIAGNOSIS — D0512 Intraductal carcinoma in situ of left breast: Secondary | ICD-10-CM | POA: Diagnosis not present

## 2021-04-23 DIAGNOSIS — Z510 Encounter for antineoplastic radiation therapy: Secondary | ICD-10-CM | POA: Diagnosis not present

## 2021-04-23 DIAGNOSIS — Z170 Estrogen receptor positive status [ER+]: Secondary | ICD-10-CM | POA: Diagnosis not present

## 2021-04-24 ENCOUNTER — Encounter: Payer: Self-pay | Admitting: Adult Health

## 2021-04-24 ENCOUNTER — Ambulatory Visit (INDEPENDENT_AMBULATORY_CARE_PROVIDER_SITE_OTHER): Payer: Medicare Other | Admitting: Adult Health

## 2021-04-24 VITALS — BP 118/82 | HR 59 | Temp 98.0°F | Ht 65.0 in | Wt 226.0 lb

## 2021-04-24 DIAGNOSIS — C4372 Malignant melanoma of left lower limb, including hip: Secondary | ICD-10-CM | POA: Diagnosis not present

## 2021-04-24 DIAGNOSIS — D0512 Intraductal carcinoma in situ of left breast: Secondary | ICD-10-CM

## 2021-04-24 DIAGNOSIS — G3184 Mild cognitive impairment, so stated: Secondary | ICD-10-CM

## 2021-04-24 DIAGNOSIS — F3341 Major depressive disorder, recurrent, in partial remission: Secondary | ICD-10-CM

## 2021-04-24 DIAGNOSIS — I4891 Unspecified atrial fibrillation: Secondary | ICD-10-CM

## 2021-04-24 DIAGNOSIS — E669 Obesity, unspecified: Secondary | ICD-10-CM | POA: Diagnosis not present

## 2021-04-24 DIAGNOSIS — E782 Mixed hyperlipidemia: Secondary | ICD-10-CM | POA: Diagnosis not present

## 2021-04-24 DIAGNOSIS — R739 Hyperglycemia, unspecified: Secondary | ICD-10-CM

## 2021-04-24 DIAGNOSIS — G4700 Insomnia, unspecified: Secondary | ICD-10-CM

## 2021-04-24 DIAGNOSIS — I10 Essential (primary) hypertension: Secondary | ICD-10-CM

## 2021-04-24 LAB — CBC WITH DIFFERENTIAL/PLATELET
Basophils Absolute: 0.1 10*3/uL (ref 0.0–0.1)
Basophils Relative: 1.1 % (ref 0.0–3.0)
Eosinophils Absolute: 0.2 10*3/uL (ref 0.0–0.7)
Eosinophils Relative: 3.5 % (ref 0.0–5.0)
HCT: 39.3 % (ref 36.0–46.0)
Hemoglobin: 13.2 g/dL (ref 12.0–15.0)
Lymphocytes Relative: 16.2 % (ref 12.0–46.0)
Lymphs Abs: 0.8 10*3/uL (ref 0.7–4.0)
MCHC: 33.7 g/dL (ref 30.0–36.0)
MCV: 99.6 fl (ref 78.0–100.0)
Monocytes Absolute: 0.5 10*3/uL (ref 0.1–1.0)
Monocytes Relative: 9.8 % (ref 3.0–12.0)
Neutro Abs: 3.4 10*3/uL (ref 1.4–7.7)
Neutrophils Relative %: 69.4 % (ref 43.0–77.0)
Platelets: 236 10*3/uL (ref 150.0–400.0)
RBC: 3.95 Mil/uL (ref 3.87–5.11)
RDW: 13.2 % (ref 11.5–15.5)
WBC: 4.9 10*3/uL (ref 4.0–10.5)

## 2021-04-24 LAB — COMPREHENSIVE METABOLIC PANEL
ALT: 19 U/L (ref 0–35)
AST: 21 U/L (ref 0–37)
Albumin: 4.4 g/dL (ref 3.5–5.2)
Alkaline Phosphatase: 65 U/L (ref 39–117)
BUN: 16 mg/dL (ref 6–23)
CO2: 28 mEq/L (ref 19–32)
Calcium: 9.8 mg/dL (ref 8.4–10.5)
Chloride: 104 mEq/L (ref 96–112)
Creatinine, Ser: 0.68 mg/dL (ref 0.40–1.20)
GFR: 84.47 mL/min (ref >60.00)
Glucose, Bld: 89 mg/dL (ref 70–99)
Potassium: 4.3 mEq/L (ref 3.5–5.1)
Sodium: 139 mEq/L (ref 135–145)
Total Bilirubin: 0.8 mg/dL (ref 0.2–1.2)
Total Protein: 7.3 g/dL (ref 6.0–8.3)

## 2021-04-24 LAB — LIPID PANEL
Cholesterol: 177 mg/dL (ref 0–200)
HDL: 66.5 mg/dL (ref >39.00)
LDL Cholesterol: 97 mg/dL (ref 0–99)
NonHDL: 110.94
Total CHOL/HDL Ratio: 3
Triglycerides: 69 mg/dL (ref 0.0–149.0)
VLDL: 13.8 mg/dL (ref 0.0–40.0)

## 2021-04-24 LAB — TSH: TSH: 2.57 u[IU]/mL (ref 0.35–5.50)

## 2021-04-24 LAB — HEMOGLOBIN A1C: Hgb A1c MFr Bld: 5.4 % (ref 4.6–6.5)

## 2021-04-24 NOTE — Progress Notes (Signed)
Subjective:    Patient ID: Tonya Bartlett, female    DOB: [birth year], 76 y.o.   MRN: [id number]  HPI  Patient presents for yearly preventative medicine examination. She is a pleasant 76 year old female who  has a past medical history of Allergic rhinitis, Allergy, Anemia, Anxiety, Blood transfusion without reported diagnosis, Breast cancer ([place]) (12/2020), Cataract, Clotting disorder ([place]), Colitis, Complication of anesthesia, Depression, Diverticulosis, Dysrhythmia, Frequency of urination, GERD (gastroesophageal reflux disease), History of colon polyps, History of DVT of lower extremity, History of hiatal hernia, History of iron deficiency anemia (1996), History of migraine headaches, History of rib fracture, Hyperlipidemia, Hypertension, IBS (irritable bowel syndrome), Insomnia, MCI (mild cognitive impairment), Melanoma ([place]) (2019), Migraine headache, OA (osteoarthritis), Osteopenia, Pneumonia, PONV (postoperative nausea and vomiting), Recovering alcoholic ([place]), Right rotator cuff tear, Substance abuse ([place]), and Unspecified essential hypertension.  Breast Cancer -diagnosed in 2022.  She has undergone a lumpectomy.  Completed her last radiation treatment yesterday.  She will start tamoxifen x5 years on 04/30/2021. She is doing well overall but feels fatigued and continues to have a red rash on her left breast from the radiation   HTN -prescribed Cozaar '100mg'$  daily. BP Readings from Last 3 Encounters:  04/21/21 (!) 153/82  04/18/21 122/72  03/21/21 130/80   AFib -takes Eliquis 5 mg twice daily and metoprolol 12.5 mg twice daily.  She does feel well controlled.  No racing heart, palpitations, or chest pain.  Hyperlipidemia-currently prescribed Lipitor 20 mg daily.  She denies myalgia or fatigue Lab Results  Component Value Date   CHOL 177 04/23/2020   HDL 69.40 04/23/2020   LDLCALC 92 04/23/2020   LDLDIRECT 187.9 10/08/2009   TRIG 76.0 04/23/2020   CHOLHDL 3 04/23/2020   History of  melanoma-diagnosed in October 2019.  She is seen by dermatology on a routine basis  Mild cognitive impairment-followed by neurology.  Currently prescribed Cerefolin.  Does have some occasional word finding difficulties and mild short-term memory loss.  Anxiety and depression-seen by behavioral health and currently prescribed effexor 37.5 mg as psychiatry thought it would be better for her while on Tamoxifen   Insomnia-takes trazodone as needed  Obesity -this is her biggest complaint today feels as though it causes a lot of her depression.  She has had multiple friends be prescribed Mounjaro or [place] and they have done well on this medication.  She is wondering if she could get this prescribed to her.  Does try and eat a healthy diet, exercise is hard for her right now due to the fatigue aspect after radiation therapy.  Wt Readings from Last 10 Encounters:  04/24/21 226 lb (102.5 kg)  04/21/21 226 lb 14.4 oz (102.9 kg)  04/18/21 226 lb (102.5 kg)  03/21/21 226 lb (102.5 kg)  03/19/21 226 lb 9.6 oz (102.8 kg)  03/13/21 226 lb (102.5 kg)  03/11/21 225 lb 11.2 oz (102.4 kg)  02/27/21 218 lb (98.9 kg)  02/12/21 222 lb (100.7 kg)  02/06/21 223 lb (101.2 kg)    All immunizations and health maintenance protocols were reviewed with the patient and needed orders were placed.  Appropriate screening laboratory values were ordered for the patient including screening of hyperlipidemia, renal function and hepatic function.   Medication reconciliation,  past medical history, social history, problem list and allergies were reviewed in detail with the patient  Goals were established with regard to weight loss, exercise, and  diet in compliance with medications  Review of Systems  Constitutional:  Positive  for activity change and fatigue.  HENT: Negative.    Eyes: Negative.   Respiratory: Negative.    Cardiovascular: Negative.   Gastrointestinal: Negative.   Endocrine: Negative.    Genitourinary: Negative.   Musculoskeletal: Negative.   Skin:  Positive for rash.  Allergic/Immunologic: Negative.   Neurological: Negative.   Hematological: Negative.   Psychiatric/Behavioral:  Positive for dysphoric mood and sleep disturbance.    Past Medical History:  Diagnosis Date   Allergic rhinitis    Allergy    Anemia    Anxiety    Blood transfusion without reported diagnosis    Breast cancer ([place]) 12/2020   left   Cataract    bil cataracts removed   Clotting disorder ([place])    DVT- 1996 po knee replacement   Colitis    Complication of anesthesia    vomit x1 while on Morphine per pt   Depression    Diverticulosis    Dysrhythmia    Atrial fibrillation   Frequency of urination    GERD (gastroesophageal reflux disease)    History of colon polyps    History of DVT of lower extremity    POST LEFT TOTAL KNEE  1996   History of hiatal hernia    History of iron deficiency anemia 1996   iron infusion   History of migraine headaches    History of rib fracture    Hyperlipidemia    Hypertension    IBS (irritable bowel syndrome)    Insomnia    MCI (mild cognitive impairment)    Melanoma ([place]) 2019   left leg    Migraine headache    hx of migraines    OA (osteoarthritis)    RIGHT SHOULDER   Osteopenia    Pneumonia    remote history   PONV (postoperative nausea and vomiting)    Recovering alcoholic ([place])    SINCE [id number]   Right rotator cuff tear    Substance abuse ([place])    recovering alcoholic   Unspecified essential hypertension     Social History   Socioeconomic History   Marital status: Widowed    Spouse name: Not on file   Number of children: 3   Years of education: Not on file   Highest education level: Master's degree (e.g., MA, MS, MEng, MEd, MSW, MBA)  Occupational History   Occupation: retired Transport planner  Tobacco Use   Smoking status: Former    Packs/day: 0.50    Years: 15.00    Pack years: 7.50    Types: Cigarettes    Quit date:  05/15/1985    Years since quitting: 35.9   Smokeless tobacco: Never  Vaping Use   Vaping Use: Never used  Substance and Sexual Activity   Alcohol use: No    Comment: RECOVERING ALCOHOLIC--  QUIT [id number]   Drug use: No   Sexual activity: Not Currently    Birth control/protection: Post-menopausal  Other Topics Concern   Not on file  Social History Narrative   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    Widowed    Three kids    82 grandchildren       Social Determinants of Radio broadcast assistant Strain: Low Risk    Difficulty of Paying Living Expenses: Not hard at all  Food Insecurity: No Food Insecurity   Worried About Charity fundraiser in the Last Year: Never true   Arboriculturist in the Last Year: Never true  Transportation Needs: No Data processing manager (Medical): No   Lack of Transportation (Non-Medical): No  Physical Activity: Inactive   Days of Exercise per Week: 0 days   Minutes of Exercise per Session: 0 min  Stress: No Stress Concern Present   Feeling of Stress : Not at all  Social Connections: Moderately Integrated   Frequency of Communication with Friends and Family: More than three times a week   Frequency of Social Gatherings with Friends and Family: More than three times a week   Attends Religious Services: More than 4 times per year   Active Member of [employer] or Organizations: Yes   Attends Archivist Meetings: More than 4 times per year   Marital Status: Widowed  Human resources officer Violence: Not At Risk   Fear of Current or Ex-Partner: No   Emotionally Abused: No   Physically Abused: No   Sexually Abused: No    Past Surgical History:  Procedure Laterality Date   BREAST BIOPSY Left 01/06/2021   pos   BREAST LUMPECTOMY WITH RADIOACTIVE SEED LOCALIZATION Left 02/27/2021   Procedure: LEFT BREAST LUMPECTOMY WITH RADIOACTIVE SEED LOCALIZATION;  Surgeon: [name], MD;   Location: [facility];  Service: General;  Laterality: Left;   BUNIONECTOMY/  HAMMERTOE CORRECTION  RIGHT FOOT  2011   CATARACT EXTRACTION W/ INTRAOCULAR LENS  IMPLANT, BILATERAL     COLONOSCOPY     DILATION AND CURETTAGE OF UTERUS  1976   EYE SURGERY Bilateral 2016   cataract removal   KNEE ARTHROSCOPY W/ MENISCECTOMY Bilateral X2  LEFT /    X1  RIGHT   KNEE OPEN LATERAL RELEASE Bilateral    MOHS SURGERY Left 12/15/2017   [facility] - [facility]   ORIF ANKLE FRACTURE Right 08/04/2020   Procedure: OPEN REDUCTION INTERNAL FIXATION (ORIF) ANKLE FRACTURE;  Surgeon: Wylene Simmer, MD;  Location: [facility];  Service: Orthopedics;  Laterality: Right;  Mini C-arm, [employer] Small Frag   REPLACEMENT TOTAL KNEE Left 2006   SHOULDER ARTHROSCOPY WITH SUBACROMIAL DECOMPRESSION, ROTATOR CUFF REPAIR AND BICEP TENDON REPAIR Right 05/23/2013   Procedure: RIGHT SHOULDER ARTHROSCOPY EXAM UNDER ANESTHESIA  WITH SUBACROMIAL DECOMPRESSION,DISTAL CLAVICLE RESECTION, SADLABRAL DEBRIDEMENT CHONDROPLASTY, BICEP TENOTOMY ;  Surgeon: Sydnee Cabal, MD;  Location: [facility];  Service: Orthopedics;  Laterality: Right;   TONSILLECTOMY AND ADENOIDECTOMY  AGE 15   TOTAL HIP ARTHROPLASTY Left 05/04/2017   Procedure: LEFT TOTAL HIP ARTHROPLASTY ANTERIOR APPROACH;  Surgeon: Paralee Cancel, MD;  Location: [facility];  Service: Orthopedics;  Laterality: Left;   TOTAL HIP ARTHROPLASTY Right 08/01/2019   Procedure: TOTAL HIP ARTHROPLASTY ANTERIOR APPROACH;  Surgeon: Paralee Cancel, MD;  Location: [facility];  Service: Orthopedics;  Laterality: Right;  54mns   TOTAL KNEE ARTHROPLASTY Bilateral LEFT  1996/   RIGHT 2004   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      UPPER GASTROINTESTINAL ENDOSCOPY     UPPER GI ENDOSCOPY     VAGINAL HYSTERECTOMY  1976    Family History  Problem Relation Age of Onset   Melanoma Mother 848  Heart disease Father 62  Hypertension Father    Esophageal cancer Brother     Dementia Paternal Grandfather    Melanoma Niece 165  Colon cancer Neg Hx    Colon polyps Neg Hx    Stomach cancer Neg Hx    Rectal cancer Neg Hx     Allergies  Allergen Reactions  [place] Nail [Apremilast] Anaphylaxis    Suicidal ideation   Penicillins Anaphylaxis    Has patient had a PCN reaction causing immediate rash, facial/tongue/throat swelling, SOB or lightheadedness with hypotension: Yes Has patient had a PCN reaction causing severe rash involving mucus membranes or skin necrosis: Yes Has patient had a PCN reaction that required hospitalization: No Has patient had a PCN reaction occurring within the last 10 year No If all of the above answers are "NO", then may proceed with Cephalosporin use.    Erythromycin Other (See Comments)    SEVERE STOMACH CRAMPS   Morphine And Related Nausea And Vomiting   Nsaids Other (See Comments)    SEVERE STOMACH CRAMPS, MOUTH SORES **Able to tolerate Tylenol   Remicade [Infliximab] Other (See Comments)    Shut down immune system-BP high   Tolmetin     SEVERE STOMACH CRAMPS   Nickel Rash    Including snaps on hospital gowns     Current Outpatient Medications on File Prior to Visit  Medication Sig Dispense Refill   acetaminophen (TYLENOL) 325 MG tablet Take 650 mg by mouth every 6 (six) hours as needed for moderate pain.     Antiseborrheic Products, Misc. (PROMISEB) CREA Uses topical daily as needed on her face     apixaban (ELIQUIS) 5 MG TABS tablet Take 1 tablet (5 mg total) by mouth 2 (two) times daily. 180 tablet 1   betamethasone dipropionate 0.05 % lotion APPLY TO AFFECTED AREA EVERY DAY 60 mL 1   escitalopram (LEXAPRO) 10 MG tablet TAKE 1 AND 1/2 TABLETS(15 MG) BY MOUTH DAILY 135 tablet 0   famotidine (PEPCID) 20 MG tablet Take 1 tablet (20 mg total) by mouth daily as needed for heartburn or indigestion. (Patient taking differently: Take 20 mg by mouth daily.) 90 tablet 3   FIBER PO Take 1 capsule by mouth in the morning and at bedtime.      HYDROcodone-acetaminophen (NORCO/VICODIN) 5-325 MG tablet 1 tablet as needed     hydrocortisone cream 1 % Apply 1 application topically daily as needed for itching.     L-Methylfolate-B12-B6-B2 (CEREFOLIN) 07-17-48-5 MG TABS TAKE 1 TABLET BY MOUTH EVERY DAY 90 tablet 0   losartan (COZAAR) 100 MG tablet Take 100 mg by mouth daily.     metoprolol tartrate (LOPRESSOR) 25 MG tablet Take 0.5 tablets (12.5 mg total) by mouth 2 (two) times daily. 60 tablet 3   omeprazole (PRILOSEC) 40 MG capsule Take 1 capsule (40 mg total) by mouth daily. 30 capsule 5   rizatriptan (MAXALT) 10 MG tablet Take 1 tablet (10 mg total) by mouth as needed for migraine. May repeat in 2 hours if needed 10 tablet 2   sulfamethoxazole-trimethoprim (BACTRIM,SEPTRA) 400-80 MG tablet Take 1 tablet by mouth daily. For urethritis.     tamoxifen (NOLVADEX) 10 MG tablet Take 0.5 tablets (5 mg total) by mouth 2 (two) times daily. 60 tablet 1   traZODone (DESYREL) 50 MG tablet TAKE 1 TABLET(50 MG) BY MOUTH AT BEDTIME 90 tablet 1   venlafaxine XR (EFFEXOR XR) 37.5 MG 24 hr capsule Take 1 capsule (37.5 mg total) by mouth daily with breakfast. 30 capsule 0   atorvastatin (LIPITOR) 20 MG tablet Take 1 tablet (20 mg total) by mouth daily. 90 tablet 3   No current facility-administered medications on file prior to visit.    Pulse (!) 59    Temp 98 F (36.7 C) (Oral)    Ht '5\' 5"'$  (1.651 m)    Wt  226 lb (102.5 kg)    SpO2 96%    BMI 37.61 kg/m        Objective:   Physical Exam Vitals and nursing note reviewed.  Constitutional:      General: She is not in acute distress.    Appearance: Normal appearance. She is well-developed. She is obese. She is not ill-appearing.  HENT:     Head: Normocephalic and atraumatic.     Right Ear: Tympanic membrane, ear canal and external ear normal. There is no impacted cerumen.     Left Ear: Tympanic membrane, ear canal and external ear normal. There is no impacted cerumen.     Nose: Nose normal. No  congestion or rhinorrhea.     Mouth/Throat:     Mouth: Mucous membranes are moist.     Pharynx: Oropharynx is clear. No oropharyngeal exudate or posterior oropharyngeal erythema.  Eyes:     General:        Right eye: No discharge.        Left eye: No discharge.     Extraocular Movements: Extraocular movements intact.     Conjunctiva/sclera: Conjunctivae normal.     Pupils: Pupils are equal, round, and reactive to light.  Neck:     Thyroid: No thyromegaly.     Vascular: No carotid bruit.     Trachea: No tracheal deviation.  Cardiovascular:     Rate and Rhythm: Normal rate and regular rhythm.     Pulses: Normal pulses.     Heart sounds: Normal heart sounds. No murmur heard.   No friction rub. No gallop.  Pulmonary:     Effort: Pulmonary effort is normal. No respiratory distress.     Breath sounds: Normal breath sounds. No stridor. No wheezing, rhonchi or rales.  Chest:     Chest wall: No tenderness.  Abdominal:     General: Abdomen is flat. Bowel sounds are normal. There is no distension.     Palpations: Abdomen is soft. There is no mass.     Tenderness: There is no abdominal tenderness. There is no right CVA tenderness, left CVA tenderness, guarding or rebound.     Hernia: No hernia is present.  Musculoskeletal:        General: No swelling, tenderness, deformity or signs of injury. Normal range of motion.     Cervical back: Normal range of motion and neck supple.     Right lower leg: No edema.     Left lower leg: No edema.  Lymphadenopathy:     Cervical: No cervical adenopathy.  Skin:    General: Skin is warm and dry.     Coloration: Skin is not jaundiced or pale.     Findings: No bruising, erythema, lesion or rash.  Neurological:     General: No focal deficit present.     Mental Status: She is alert and oriented to person, place, and time.     Cranial Nerves: No cranial nerve deficit.     Sensory: No sensory deficit.     Motor: No weakness.     Coordination:  Coordination normal.     Gait: Gait normal.     Deep Tendon Reflexes: Reflexes normal.  Psychiatric:        Mood and Affect: Mood normal.        Behavior: Behavior normal.        Thought Content: Thought content normal.        Judgment: Judgment normal.      Assessment &  Plan:  1. Essential hypertension - Well controlled.  - No change in medications  - CBC with Differential/Platelet; Future - Comprehensive metabolic panel; Future - Lipid panel; Future - TSH; Future - Hemoglobin A1c; Future - Hemoglobin A1c - TSH - Lipid panel - Comprehensive metabolic panel - CBC with Differential/Platelet  2. Mixed hyperlipidemia - Continue with statin  - CBC with Differential/Platelet; Future - Comprehensive metabolic panel; Future - Lipid panel; Future - TSH; Future - Hemoglobin A1c; Future - Hemoglobin A1c - TSH - Lipid panel - Comprehensive metabolic panel - CBC with Differential/Platelet  3. Atrial fibrillation, unspecified type (H[facility]) - Continue Eliquis and BB - CBC with Differential/Platelet; Future - Comprehensive metabolic panel; Future - Lipid panel; Future - TSH; Future - TSH - Lipid panel - Comprehensive metabolic panel - CBC with Differential/Platelet  4. 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 MCI (mild cognitive impairment) -Follow-up with neurology as directed - CBC with Differential/Platelet; Future - Comprehensive metabolic panel; Future - Lipid panel; Future - TSH; Future - TSH - Lipid panel - Comprehensive metabolic panel - CBC with Differential/Platelet  6. Insomnia, unspecified type -Continue trazodone as needed - CBC with Differential/Platelet; Future - Comprehensive metabolic panel; Future - Lipid panel; Future - TSH; Future - TSH - Lipid panel - Comprehensive metabolic panel - CBC with Differential/Platelet   7. Recurrent major depressive disorder, in partial remission  ([place]) -Follow-up with psychiatry as directed  8. Ductal carcinoma in situ (DCIS) of left breast -Congratulated on completing treatment.  Red rash on left breast should resolve within the next month and fatigue should start to improve  9. Elevated blood sugar  - CBC with Differential/Platelet; Future - Comprehensive metabolic panel; Future - Lipid panel; Future - TSH; Future - Hemoglobin A1c; Future - Hemoglobin A1c - TSH - Lipid panel - Comprehensive metabolic panel - CBC with Differential/Platelet  10. Obesity (BMI 30-39.9) -Advised that it was highly doubtful that Mancel Parsons would be covered by her insurance.  Darcel Bayley is not FDA approved for weight loss. - CBC with Differential/Platelet; Future - Comprehensive metabolic panel; Future - Lipid panel; Future - TSH; Future - Hemoglobin A1c; Future - Hemoglobin A1c - TSH - Lipid panel - Comprehensive metabolic panel - CBC with Differential/Platelet  [name], NP

## 2021-04-24 NOTE — Patient Instructions (Signed)
It was great seeing you today   We will follow up with you regarding your lab work   Please let me know if you need anything   

## 2021-04-25 ENCOUNTER — Telehealth: Payer: Self-pay | Admitting: Adult Health

## 2021-04-25 ENCOUNTER — Other Ambulatory Visit (HOSPITAL_COMMUNITY): Payer: Self-pay | Admitting: Psychiatry

## 2021-04-25 DIAGNOSIS — E6609 Other obesity due to excess calories: Secondary | ICD-10-CM

## 2021-04-25 NOTE — Telephone Encounter (Signed)
Updated patient on her labs. Everything looks good.  ? ?She is interested in going to healthy weight and wellness to help lose weight  ?

## 2021-05-14 ENCOUNTER — Telehealth (INDEPENDENT_AMBULATORY_CARE_PROVIDER_SITE_OTHER): Payer: Medicare Other | Admitting: Psychiatry

## 2021-05-14 ENCOUNTER — Encounter (HOSPITAL_COMMUNITY): Payer: Self-pay | Admitting: Psychiatry

## 2021-05-14 DIAGNOSIS — F5102 Adjustment insomnia: Secondary | ICD-10-CM

## 2021-05-14 DIAGNOSIS — F331 Major depressive disorder, recurrent, moderate: Secondary | ICD-10-CM | POA: Diagnosis not present

## 2021-05-14 NOTE — Progress Notes (Signed)
[facility] Outpatient visit ?Tele psych  ?Patient Identification: Tonya Bartlett ?MRN:  [id number] ?Date of Evaluation:  05/14/2021 ?Referral Source: NP ?Chief Complaint:   ?Depression follow up  ?Visit Diagnosis:  ?  ICD-10-CM   ?1. Major depressive disorder, recurrent episode, moderate (HCC)  F33.1   ?  ?2. Adjustment insomnia  F51.02   ?  ? ?Virtual Visit via Video Note ? ?I connected with Tonya Bartlett on 05/14/21 at  9:30 AM EDT by a video enabled telemedicine application and verified that I am speaking with the correct person using two identifiers. ? ?Location: ?Patient: home ?Provider: home office ?  ?I discussed the limitations of evaluation and management by telemedicine and the availability of in person appointments. The patient expressed understanding and agreed to proceed. ? ? ?  ?I discussed the assessment and treatment plan with the patient. The patient was provided an opportunity to ask questions and all were answered. The patient agreed with the plan and demonstrated an understanding of the instructions. ?  ?The patient was advised to call back or seek an in-person evaluation if the symptoms worsen or if the condition fails to improve as anticipated. ? ?I provided 15 minutes of non-face-to-face time during this encounter. ? ? ?History of Present Illness: 76 years old currently widowed white female lives by herself has 3 grown kids and 6 grandkids referred initially by family medicine for management of depression ?  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  ?On effexor helping small dose for anxiety and depression ?Recently got tooth implant partial  ? ? ?Denies dizziness as of now ? ? ?Modifying factors : family, son, daughter is now communicating ?aggravating factors. Hip surgery, breast surgery, medical conditions ?Severity managing depressionfair ? ?Past Psychiatric History: depression ? ?Previous Psychotropic Medications: Yes   ? ?Substance Abuse History in the last 12 months:  No. ? ?Consequences of Substance Abuse: ?NA ? ?Past Medical History:  ?Past Medical History:  ?Diagnosis Date  ? Allergic rhinitis   ? Allergy   ? Anemia   ? Anxiety   ? Blood transfusion without reported diagnosis   ? Breast cancer ([place]) 12/2020  ? left  ? Cataract   ? bil cataracts removed  ? Clotting disorder ([place])   ? DVT- 1996 po knee replacement  ? Colitis   ? Complication of anesthesia   ? vomit x1 while on Morphine per pt  ? Depression   ? Diverticulosis   ? Dysrhythmia   ? Atrial fibrillation  ? Frequency of urination   ? GERD (gastroesophageal reflux disease)   ? History of colon polyps   ? History of DVT of lower extremity   ? POST LEFT TOTAL KNEE  1996  ? History of hiatal hernia   ? History of iron deficiency anemia 1996  ? iron infusion  ? History of migraine headaches   ? History of rib fracture   ? Hyperlipidemia   ? Hypertension   ? IBS (irritable bowel syndrome)   ? Insomnia   ? MCI (mild cognitive impairment)   ? Melanoma ([place]) 2019  ? left leg   ? Migraine headache   ? hx of migraines   ? OA (osteoarthritis)   ? RIGHT SHOULDER  ? Osteopenia   ? Pneumonia   ? remote history  ? PONV (postoperative nausea and vomiting)   ? Recovering alcoholic ([place])   ? 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 Right  rotator cuff tear   ? Substance abuse ([place])   ? recovering alcoholic  ? Unspecified essential hypertension   ?  ?Past Surgical History:  ?Procedure Laterality Date  ? BREAST BIOPSY Left 01/06/2021  ? pos  ? BREAST LUMPECTOMY WITH RADIOACTIVE SEED LOCALIZATION Left 02/27/2021  ? Procedure: LEFT BREAST LUMPECTOMY WITH RADIOACTIVE SEED LOCALIZATION;  Surgeon: [name], MD;  Location: [place];  Service: General;  Laterality: Left;  ? BUNIONECTOMY/  HAMMERTOE CORRECTION  RIGHT FOOT  2011  ? CATARACT EXTRACTION W/ INTRAOCULAR LENS  IMPLANT, BILATERAL    ? COLONOSCOPY    ? DILATION AND CURETTAGE OF UTERUS  1976  ? EYE SURGERY Bilateral 2016  ? cataract removal  ? KNEE  ARTHROSCOPY W/ MENISCECTOMY Bilateral X2  LEFT /    X1  RIGHT  ? KNEE OPEN LATERAL RELEASE Bilateral   ? MOHS SURGERY Left 12/15/2017  ? Melanoma in situ - left calf [facility]  ? ORIF ANKLE FRACTURE Right 08/04/2020  ? Procedure: OPEN REDUCTION INTERNAL FIXATION (ORIF) ANKLE FRACTURE;  Surgeon: Wylene Simmer, MD;  Location: [facility];  Service: Orthopedics;  Laterality: Right;  Mini C-arm, [employer] Small Frag  ? REPLACEMENT TOTAL KNEE Left 2006  ? SHOULDER ARTHROSCOPY WITH SUBACROMIAL DECOMPRESSION, ROTATOR CUFF REPAIR AND BICEP TENDON REPAIR Right 05/23/2013  ? Procedure: RIGHT SHOULDER ARTHROSCOPY EXAM UNDER ANESTHESIA  WITH SUBACROMIAL DECOMPRESSION,DISTAL CLAVICLE RESECTION, SADLABRAL DEBRIDEMENT CHONDROPLASTY, BICEP TENOTOMY ;  Surgeon: Sydnee Cabal, MD;  Location: [place];  Service: Orthopedics;  Laterality: Right;  ? TONSILLECTOMY AND ADENOIDECTOMY  AGE 51  ? TOTAL HIP ARTHROPLASTY Left 05/04/2017  ? Procedure: LEFT TOTAL HIP ARTHROPLASTY ANTERIOR APPROACH;  Surgeon: Paralee Cancel, MD;  Location: [facility];  Service: Orthopedics;  Laterality: Left;  ? TOTAL HIP ARTHROPLASTY Right 08/01/2019  ? Procedure: TOTAL HIP ARTHROPLASTY ANTERIOR APPROACH;  Surgeon: Paralee Cancel, MD;  Location: [facility];  Service: Orthopedics;  Laterality: Right;  79mns  ? TOTAL KNEE ARTHROPLASTY Bilateral LEFT  1996/   RIGHT 2004  ? 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     ? UPPER GASTROINTESTINAL ENDOSCOPY    ? UPPER GI ENDOSCOPY    ? VAGINAL HYSTERECTOMY  1976  ? ? ?Family Psychiatric History: mom possible depression ? ?Family History:  ?Family History  ?Problem Relation Age of Onset  ? Melanoma Mother 844 ? Heart disease Father 629 ? Hypertension Father   ? Esophageal cancer Brother   ? Dementia Paternal Grandfather   ? Melanoma Niece 157 ? Colon cancer Neg Hx   ? Colon polyps Neg Hx   ? Stomach cancer Neg Hx   ? Rectal cancer Neg Hx   ? ? ?Social History:   ?Social History  ? ?Socioeconomic History  ?  Marital status: Widowed  ?  Spouse name: Not on file  ? Number of children: 3  ? Years of education: Not on file  ? Highest education level: Master's degree (e.g., MA, MS, MEng, MEd, MSW, MBA)  ?Occupational History  ? Occupation: retired tTransport planner ?Tobacco Use  ? Smoking status: Former  ?  Packs/day: 0.50  ?  Years: 15.00  ?  Pack years: 7.50  ?  Types: Cigarettes  ?  Quit date: 05/15/1985  ?  Years since quitting: 36.0  ? Smokeless tobacco: Never  ?Vaping Use  ? Vaping Use: Never used  ?Substance and Sexual Activity  ? Alcohol use: No  ?  Comment: RECOVERING ALCOHOLIC--  QUIT [id number] ?  Drug use: No  ? Sexual activity: Not Currently  ?  Birth control/protection: Post-menopausal  ?Other Topics Concern  ? Not on file  ?Social History Narrative  ? 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 Widowed   ? Three kids   ? 6 grandchildren   ?   ? ?Social Determinants of Health  ? ?Financial Resource Strain: Low Risk   ? Difficulty of Paying Living Expenses: Not hard at all  ?Food Insecurity: No Food Insecurity  ? Worried About Charity fundraiser in the Last Year: Never true  ? Ran Out of Food in the Last Year: Never true  ?Transportation Needs: No Transportation Needs  ? Lack of Transportation (Medical): No  ? Lack of Transportation (Non-Medical): No  ?Physical Activity: Inactive  ? Days of Exercise per Week: 0 days  ? Minutes of Exercise per Session: 0 min  ?Stress: No Stress Concern Present  ? Feeling of Stress : Not at all  ?Social Connections: Moderately Integrated  ? Frequency of Communication with Friends and Family: More than three times a week  ? Frequency of Social Gatherings with Friends and Family: More than three times a week  ? Attends Religious Services: More than 4 times per year  ? Active Member of Clubs or Organizations: Yes  ? Attends Archivist Meetings: More than 4 times per year  ? Marital Status: Widowed  ? ? ? ?Allergies:   ?Allergies  ?Allergen  Reactions  ? [place] Nail [Apremilast] Anaphylaxis  ?  Suicidal ideation  ? Penicillins Anaphylaxis  ?  Has patient had a PCN reaction causing immediate rash, facial/tongue/throat swelling, SOB or lightheadedness with hypot

## 2021-05-15 ENCOUNTER — Other Ambulatory Visit: Payer: Self-pay | Admitting: Adult Health

## 2021-05-22 ENCOUNTER — Other Ambulatory Visit: Payer: Self-pay | Admitting: Neurology

## 2021-05-22 NOTE — Telephone Encounter (Signed)
Rx refilled.

## 2021-05-23 ENCOUNTER — Encounter: Payer: Self-pay | Admitting: Radiology

## 2021-05-23 DIAGNOSIS — Z0289 Encounter for other administrative examinations: Secondary | ICD-10-CM

## 2021-05-25 NOTE — Progress Notes (Incomplete)
?  Radiation Oncology         [phone] ?________________________________ ? ?Patient Name: Tonya Bartlett ?MRN: [id number] ?DOB: 1946/02/01 ?Referring Physician: [name] (Profile Not Attached) ?Date of Service: 04/23/2021 ?[facility], [place] ? ?                                                      End Of Treatment Note ? 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 ?Intent: Curative ? ?Radiation Treatment Dates: 03/27/2021 through 04/23/2021 ?Site Technique Total Dose (Gy) Dose per Fx (Gy) Completed Fx Beam Energies  ?Breast, Left: Breast_L 3D 40.05/40.05 2.67 15/15 10XFFF  ?Breast, Left: Breast_L_Bst 3D 10/10 2 5/5 6X, 10X  ? ?Narrative: The patient tolerated radiation therapy relatively well. During her final weekly treatment check on 04/22/21, the patient reported swelling and tenderness to the left nipple, and achy left breast pain with itching. Physical exam performed on that same date revealed a brisk reaction to the left breast without any skin breakdown.  Radiation dermatitis was also noted in the upper inner aspect of the left breast.  ? ?Plan: The patient will follow-up with radiation oncology in one month. I suggested she obtain some hydrocortisone cream for her itching. ? ? ?________________________________________________ ?----------------------------------- ? ?Blair Promise, PhD, MD ? ?This document serves as a record of services personally performed by [name], MD. It was created on his behalf by Roney Mans, a trained medical scribe. The creation of this record is based on the scribe's personal observations and the provider's statements to them. This document has been checked and approved by the attending provider. ? ?

## 2021-05-25 NOTE — Progress Notes (Incomplete)
?Radiation Oncology         [phone] ?________________________________ ? ?Name: Tonya Bartlett MRN: [id number]  ?Date: 05/26/2021  DOB: Apr 26, 1945 ? ?Follow-Up Visit Note ? ?CC: [name], NP  [name], NP ? ?No diagnosis found. ? 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 ?Interval Since Last Radiation: 1 month and 2 days  ? ?Intent: Curative ? ?Radiation Treatment Dates: 03/27/2021 through 04/23/2021 ?Site Technique Total Dose (Gy) Dose per Fx (Gy) Completed Fx Beam Energies  ?Breast, Left: Breast_L 3D 40.05/40.05 2.67 15/15 10XFFF  ?Breast, Left: Breast_L_Bst 3D 10/10 2 5/5 6X, 10X  ? ? ?Narrative:  The patient returns today for routine follow-up.  The patient tolerated radiation therapy relatively well. During her final weekly treatment check on 04/22/21, the patient reported swelling and tenderness to the left nipple, and achy left breast pain with itching. Physical exam performed on that same date revealed a brisk reaction to the left breast without any skin breakdown.  Radiation dermatitis was also noted in the upper inner aspect of the left breast. For her itching, I advised her to obtain some hydrocortisone cream.     ? ?Near the end of RT, the patient followed up with Dr. Lindi Adie on 04/21/21.  During which time, the patient reported feeling well other than mild radiation dermatitis.  Antiestrogen treatment options were discussed with the patient, and following discussion of the risks and benefits, the patient agreed to proceed with tamoxifen 5 mg x 5 years.  First dose on 04/30/21.  ? ?Otherwise, no significant interval history since the patent was last seen.  ? ?*** ?                    ? ?Allergies:  is allergic to otezla [apremilast], penicillins, erythromycin, morphine and related, nsaids, remicade [infliximab], tolmetin, and nickel. ? ?Meds: ?Current Outpatient Medications  ?Medication Sig Dispense Refill   ? acetaminophen (TYLENOL) 325 MG tablet Take 650 mg by mouth every 6 (six) hours as needed for moderate pain.    ? Antiseborrheic Products, Misc. (PROMISEB) CREA Uses topical daily as needed on her face    ? apixaban (ELIQUIS) 5 MG TABS tablet Take 1 tablet (5 mg total) by mouth 2 (two) times daily. 180 tablet 1  ? atorvastatin (LIPITOR) 20 MG tablet Take 1 tablet (20 mg total) by mouth daily. 90 tablet 3  ? betamethasone dipropionate 0.05 % lotion APPLY TO AFFECTED AREA EVERY DAY 60 mL 1  ? famotidine (PEPCID) 20 MG tablet Take 1 tablet (20 mg total) by mouth daily as needed for heartburn or indigestion. (Patient taking differently: Take 20 mg by mouth daily.) 90 tablet 3  ? FIBER PO Take 1 capsule by mouth in the morning and at bedtime.    ? HYDROcodone-acetaminophen (NORCO/VICODIN) 5-325 MG tablet 1 tablet as needed    ? hydrocortisone cream 1 % Apply 1 application topically daily as needed for itching.    ? L-Methylfolate-B12-B6-B2 (CEREFOLIN) 07-17-48-5 MG TABS TAKE 1 TABLET BY MOUTH EVERY DAY 90 tablet 0  ? losartan (COZAAR) 100 MG tablet TAKE 1 TABLET BY MOUTH EVERY DAY 90 tablet 0  ? metoprolol tartrate (LOPRESSOR) 25 MG tablet Take 0.5 tablets (12.5 mg total) by mouth 2 (two) times daily. 60 tablet 3  ? omeprazole (PRILOSEC) 40 MG capsule Take 1 capsule (40 mg total) by mouth daily. 30 capsule 5  ? rizatriptan (MAXALT)  10 MG tablet Take 1 tablet (10 mg total) by mouth as needed for migraine. May repeat in 2 hours if needed 10 tablet 2  ? sulfamethoxazole-trimethoprim (BACTRIM,SEPTRA) 400-80 MG tablet Take 1 tablet by mouth daily. For urethritis.    ? tamoxifen (NOLVADEX) 10 MG tablet Take 0.5 tablets (5 mg total) by mouth 2 (two) times daily. 60 tablet 1  ? traZODone (DESYREL) 50 MG tablet TAKE 1 TABLET(50 MG) BY MOUTH AT BEDTIME 90 tablet 1  ? venlafaxine XR (EFFEXOR-XR) 37.5 MG 24 hr capsule TAKE 1 CAPSULE BY MOUTH DAILY WITH BREAKFAST. 90 capsule 1  ? ?No current facility-administered medications for this  encounter.  ? ? ?Physical Findings: ?The patient is in no acute distress. Patient is alert and oriented. ? vitals were not taken for this visit. .  No significant changes. Lungs are clear to auscultation bilaterally. Heart has regular rate and rhythm. No palpable cervical, supraclavicular, or axillary adenopathy. Abdomen soft, non-tender, normal bowel sounds. ? ?Left Breast: no palpable mass, nipple discharge or bleeding. *** ? ? ? ?Lab Findings: ?Lab Results  ?Component Value Date  ? WBC 4.9 04/24/2021  ? HGB 13.2 04/24/2021  ? HCT 39.3 04/24/2021  ? MCV 99.6 04/24/2021  ? PLT 236.0 04/24/2021  ? ? ?Radiographic Findings: ?No results found. ? 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 ?The patient is recovering from the effects of radiation.  *** ? ?Plan:  *** ? ? ?*** minutes of total time was spent for this patient encounter, including preparation, face-to-face counseling with the patient and coordination of care, physical exam, and documentation of the encounter. ?____________________________________ ? ?Blair Promise, PhD, MD ? ?This document serves as a record of services personally performed by [name], MD. It was created on his behalf by Roney Mans, a trained medical scribe. The creation of this record is based on the scribe's personal observations and the provider's statements to them. This document has been checked and approved by the attending provider. ? ?

## 2021-05-26 ENCOUNTER — Ambulatory Visit
Admission: RE | Admit: 2021-05-26 | Discharge: 2021-05-26 | Disposition: A | Discharge status: Home / Self Care | Payer: Medicare Other | Source: Ambulatory Visit | Attending: Radiation Oncology | Admitting: Radiation Oncology

## 2021-05-26 ENCOUNTER — Telehealth: Payer: Self-pay | Admitting: *Deleted

## 2021-05-26 DIAGNOSIS — Z510 Encounter for antineoplastic radiation therapy: Secondary | ICD-10-CM | POA: Insufficient documentation

## 2021-05-26 DIAGNOSIS — D0512 Intraductal carcinoma in situ of left breast: Secondary | ICD-10-CM | POA: Insufficient documentation

## 2021-05-26 DIAGNOSIS — Z170 Estrogen receptor positive status [ER+]: Secondary | ICD-10-CM | POA: Insufficient documentation

## 2021-05-26 NOTE — Telephone Encounter (Signed)
CALLED PATIENT TO ASK ABOUT RESCHEDULING 05-26-21 FOLLOW-UP APPT., LVM FOR A RETURN CALL ?

## 2021-05-26 NOTE — Telephone Encounter (Signed)
RETURNED PATIENT'S PHONE CALL, SPOKE WITH PATIENT. ?

## 2021-06-02 ENCOUNTER — Other Ambulatory Visit: Payer: Self-pay | Admitting: Cardiovascular Disease

## 2021-06-02 DIAGNOSIS — I480 Paroxysmal atrial fibrillation: Secondary | ICD-10-CM

## 2021-06-02 DIAGNOSIS — M7661 Achilles tendinitis, right leg: Secondary | ICD-10-CM | POA: Diagnosis not present

## 2021-06-02 DIAGNOSIS — S82851D Displaced trimalleolar fracture of right lower leg, subsequent encounter for closed fracture with routine healing: Secondary | ICD-10-CM | POA: Diagnosis not present

## 2021-06-02 NOTE — Telephone Encounter (Signed)
Prescription refill request for Eliquis received. ?Indication:Afib ?Last office visit:1/23 ?Scr:0.6 ?Age: 76 ?Weight:102.5 kg ? ?Prescription refilled ? ?

## 2021-06-04 ENCOUNTER — Ambulatory Visit (INDEPENDENT_AMBULATORY_CARE_PROVIDER_SITE_OTHER): Payer: Medicare Other | Admitting: Family Medicine

## 2021-06-09 DIAGNOSIS — L218 Other seborrheic dermatitis: Secondary | ICD-10-CM | POA: Diagnosis not present

## 2021-06-09 DIAGNOSIS — D225 Melanocytic nevi of trunk: Secondary | ICD-10-CM | POA: Diagnosis not present

## 2021-06-09 DIAGNOSIS — L821 Other seborrheic keratosis: Secondary | ICD-10-CM | POA: Diagnosis not present

## 2021-06-09 DIAGNOSIS — Z872 Personal history of diseases of the skin and subcutaneous tissue: Secondary | ICD-10-CM | POA: Diagnosis not present

## 2021-06-09 DIAGNOSIS — Z08 Encounter for follow-up examination after completed treatment for malignant neoplasm: Secondary | ICD-10-CM | POA: Diagnosis not present

## 2021-06-09 DIAGNOSIS — L814 Other melanin hyperpigmentation: Secondary | ICD-10-CM | POA: Diagnosis not present

## 2021-06-09 DIAGNOSIS — L718 Other rosacea: Secondary | ICD-10-CM | POA: Diagnosis not present

## 2021-06-09 DIAGNOSIS — Z86006 Personal history of melanoma in-situ: Secondary | ICD-10-CM | POA: Diagnosis not present

## 2021-06-09 DIAGNOSIS — L570 Actinic keratosis: Secondary | ICD-10-CM | POA: Diagnosis not present

## 2021-06-10 ENCOUNTER — Other Ambulatory Visit: Payer: Self-pay | Admitting: Adult Health

## 2021-06-10 ENCOUNTER — Other Ambulatory Visit: Payer: Self-pay | Admitting: Hematology and Oncology

## 2021-06-10 DIAGNOSIS — G4700 Insomnia, unspecified: Secondary | ICD-10-CM

## 2021-06-11 ENCOUNTER — Encounter (INDEPENDENT_AMBULATORY_CARE_PROVIDER_SITE_OTHER): Payer: Self-pay | Admitting: Family Medicine

## 2021-06-11 ENCOUNTER — Ambulatory Visit (INDEPENDENT_AMBULATORY_CARE_PROVIDER_SITE_OTHER): Payer: Medicare Other | Admitting: Family Medicine

## 2021-06-11 ENCOUNTER — Other Ambulatory Visit (INDEPENDENT_AMBULATORY_CARE_PROVIDER_SITE_OTHER): Payer: Self-pay | Admitting: Family Medicine

## 2021-06-11 VITALS — BP 147/74 | HR 60 | Temp 97.5°F | Ht 65.0 in | Wt 222.0 lb

## 2021-06-11 DIAGNOSIS — R5383 Other fatigue: Secondary | ICD-10-CM

## 2021-06-11 DIAGNOSIS — E559 Vitamin D deficiency, unspecified: Secondary | ICD-10-CM

## 2021-06-11 DIAGNOSIS — R0602 Shortness of breath: Secondary | ICD-10-CM

## 2021-06-11 DIAGNOSIS — Z1331 Encounter for screening for depression: Secondary | ICD-10-CM | POA: Diagnosis not present

## 2021-06-11 DIAGNOSIS — E669 Obesity, unspecified: Secondary | ICD-10-CM | POA: Diagnosis not present

## 2021-06-11 DIAGNOSIS — Z6836 Body mass index (BMI) 36.0-36.9, adult: Secondary | ICD-10-CM

## 2021-06-11 DIAGNOSIS — I10 Essential (primary) hypertension: Secondary | ICD-10-CM

## 2021-06-11 DIAGNOSIS — R739 Hyperglycemia, unspecified: Secondary | ICD-10-CM

## 2021-06-11 DIAGNOSIS — E785 Hyperlipidemia, unspecified: Secondary | ICD-10-CM

## 2021-06-11 DIAGNOSIS — E782 Mixed hyperlipidemia: Secondary | ICD-10-CM | POA: Diagnosis not present

## 2021-06-12 LAB — CBC WITH DIFFERENTIAL/PLATELET
Basophils Absolute: 0.1 10*3/uL (ref 0.0–0.2)
Basos: 2 %
EOS (ABSOLUTE): 0.1 10*3/uL (ref 0.0–0.4)
Eos: 2 %
Hematocrit: 36.3 % (ref 34.0–46.6)
Hemoglobin: 12.2 g/dL (ref 11.1–15.9)
Immature Grans (Abs): 0 10*3/uL (ref 0.0–0.1)
Immature Granulocytes: 0 %
Lymphocytes Absolute: 0.9 10*3/uL (ref 0.7–3.1)
Lymphs: 24 %
MCH: 33 pg (ref 26.6–33.0)
MCHC: 33.6 g/dL (ref 31.5–35.7)
MCV: 98 fL — ABNORMAL HIGH (ref 79–97)
Monocytes Absolute: 0.4 10*3/uL (ref 0.1–0.9)
Monocytes: 11 %
Neutrophils Absolute: 2.3 10*3/uL (ref 1.4–7.0)
Neutrophils: 61 %
Platelets: 250 10*3/uL (ref 150–450)
RBC: 3.7 x10E6/uL — ABNORMAL LOW (ref 3.77–5.28)
RDW: 12.1 % (ref 11.7–15.4)
WBC: 3.7 10*3/uL (ref 3.4–10.8)

## 2021-06-12 LAB — COMPREHENSIVE METABOLIC PANEL
ALT: 17 IU/L (ref 0–32)
AST: 20 IU/L (ref 0–40)
Albumin/Globulin Ratio: 1.8 (ref 1.2–2.2)
Albumin: 4.4 g/dL (ref 3.7–4.7)
Alkaline Phosphatase: 61 IU/L (ref 44–121)
BUN/Creatinine Ratio: 16 (ref 12–28)
BUN: 11 mg/dL (ref 8–27)
Bilirubin Total: 0.6 mg/dL (ref 0.0–1.2)
CO2: 25 mmol/L (ref 20–29)
Calcium: 9.2 mg/dL (ref 8.7–10.3)
Chloride: 104 mmol/L (ref 96–106)
Creatinine, Ser: 0.7 mg/dL (ref 0.57–1.00)
Globulin, Total: 2.5 g/dL (ref 1.5–4.5)
Glucose: 99 mg/dL (ref 70–99)
Potassium: 4.4 mmol/L (ref 3.5–5.2)
Sodium: 141 mmol/L (ref 134–144)
Total Protein: 6.9 g/dL (ref 6.0–8.5)
eGFR: 90 mL/min/{1.73_m2} (ref >59)

## 2021-06-12 LAB — VITAMIN B12: Vitamin B-12: 1927 pg/mL — ABNORMAL HIGH (ref 232–1245)

## 2021-06-12 LAB — LIPID PANEL
Chol/HDL Ratio: 2.7 ratio (ref 0.0–4.4)
Cholesterol, Total: 166 mg/dL (ref 100–199)
HDL: 61 mg/dL (ref >39)
LDL Chol Calc (NIH): 90 mg/dL (ref 0–99)
Triglycerides: 82 mg/dL (ref 0–149)
VLDL Cholesterol Cal: 15 mg/dL (ref 5–40)

## 2021-06-12 LAB — TSH: TSH: 2.88 u[IU]/mL (ref 0.450–4.500)

## 2021-06-12 LAB — T3, FREE: T3, Free: 3 pg/mL (ref 2.0–4.4)

## 2021-06-12 LAB — INSULIN, RANDOM: INSULIN: 9.6 u[IU]/mL (ref 2.6–24.9)

## 2021-06-12 LAB — FOLATE: Folate: >20 ng/mL (ref >3.0)

## 2021-06-12 LAB — VITAMIN D 25 HYDROXY (VIT D DEFICIENCY, FRACTURES): Vit D, 25-Hydroxy: 34.3 ng/mL (ref 30.0–100.0)

## 2021-06-12 LAB — T4, FREE: Free T4: 1.24 ng/dL (ref 0.82–1.77)

## 2021-06-12 LAB — HEMOGLOBIN A1C
Est. average glucose Bld gHb Est-mCnc: 105 mg/dL
Hgb A1c MFr Bld: 5.3 % (ref 4.8–5.6)

## 2021-06-16 ENCOUNTER — Telehealth: Payer: Medicare Other

## 2021-06-17 IMAGING — DX DG PORTABLE PELVIS
1 series · 1 of 1 positions shown · non-contrast
Comparison: 08/01/2019.

CLINICAL DATA: Right hip replacement.

EXAM:
PORTABLE PELVIS 1-2 VIEWS

[pelvis ap]
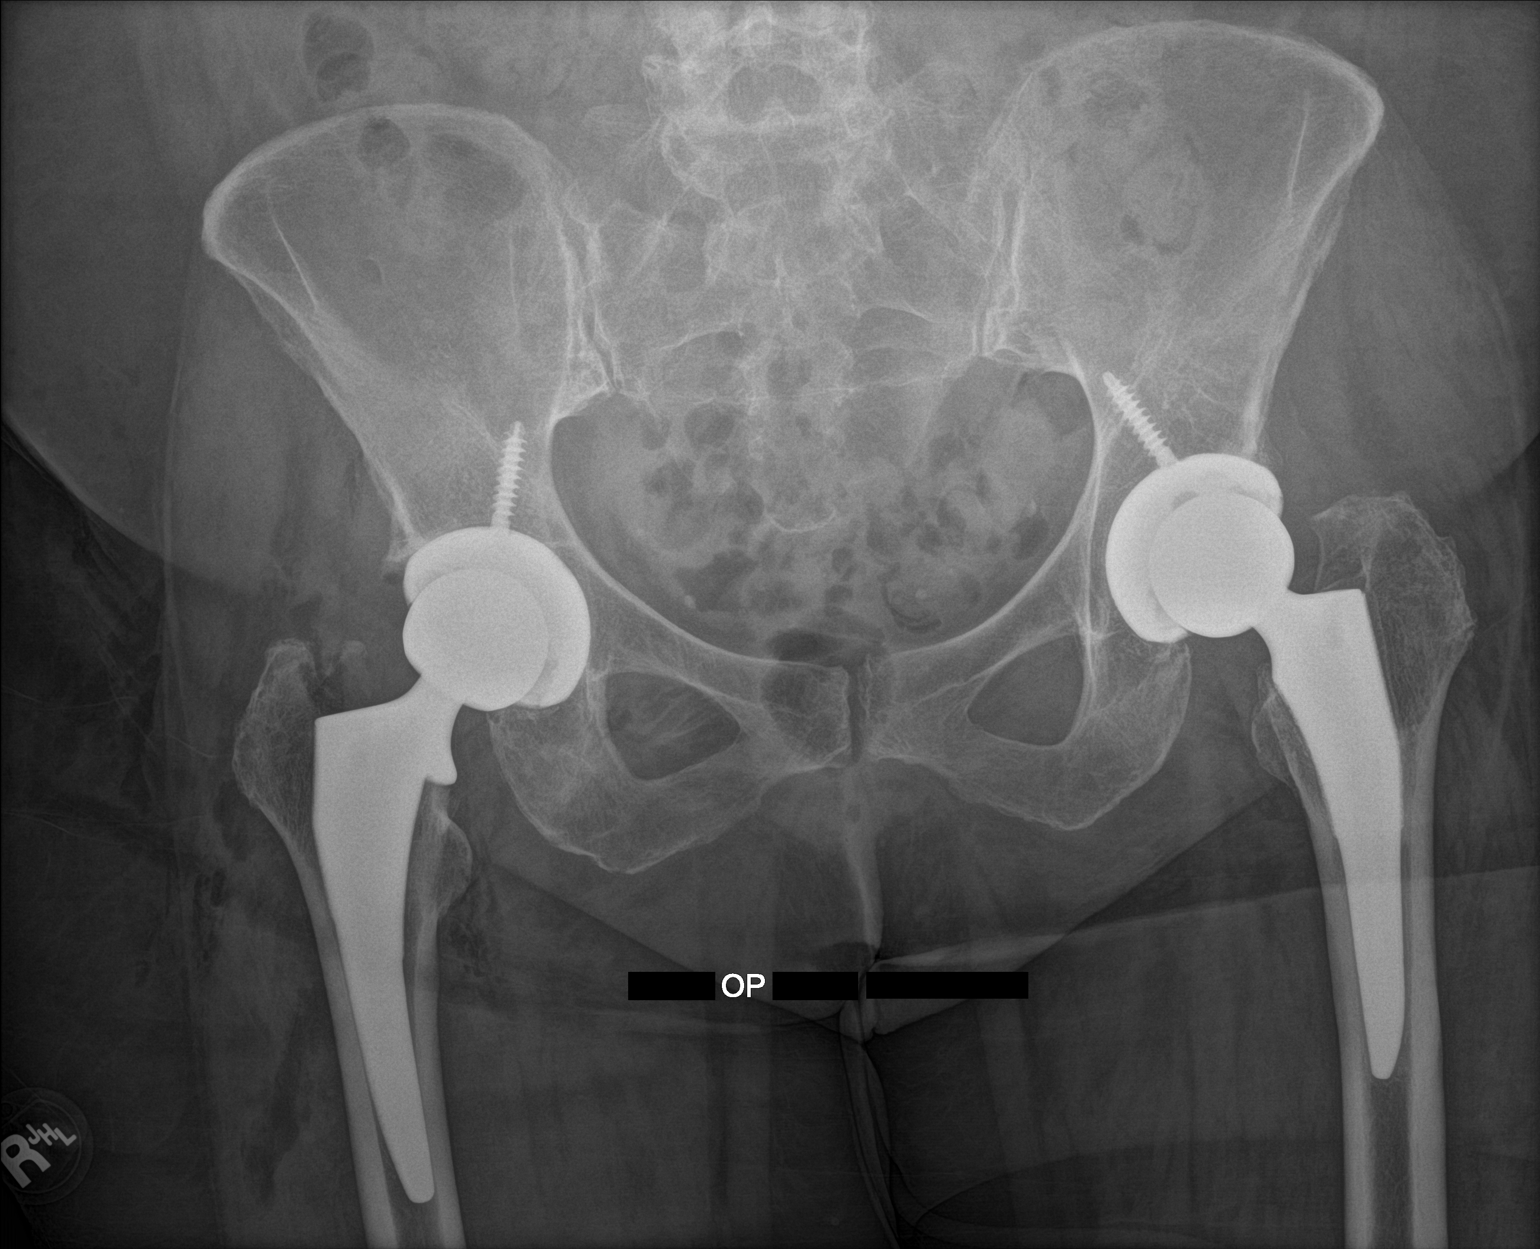

[1 of 1 positions shown; findings below may reference images not displayed]

FINDINGS: Total right hip replacement. Hardware intact. Anatomic alignment.
Prior total left hip replacement.
IMPRESSION: Total right hip replacement with anatomic alignment.

## 2021-06-17 IMAGING — RF DG HIP (WITH PELVIS) OPERATIVE*R*
1 series · 10 of 10 positions shown · non-contrast
Comparison: No recent.

CLINICAL DATA: Right hip replacement

EXAM:
OPERATIVE RIGHT HIP (WITH PELVIS IF PERFORMED) 10 VIEWS
TECHNIQUE: Fluoroscopic spot image(s) were submitted for interpretation
post-operatively.

[Series 1: unknown protocol · 0.20mm/px · 10 of 10 slices shown]
[im 1/10]
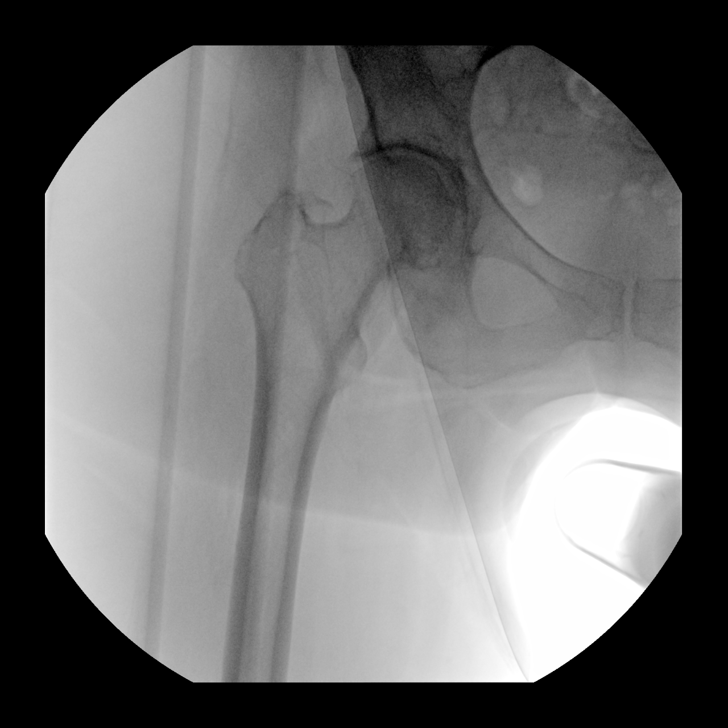
[im 2/10]
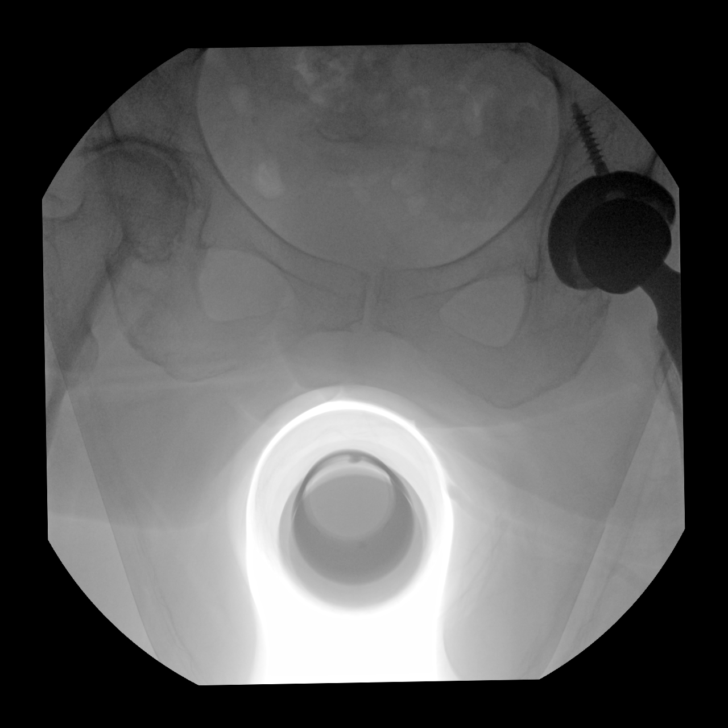
[im 3/10]
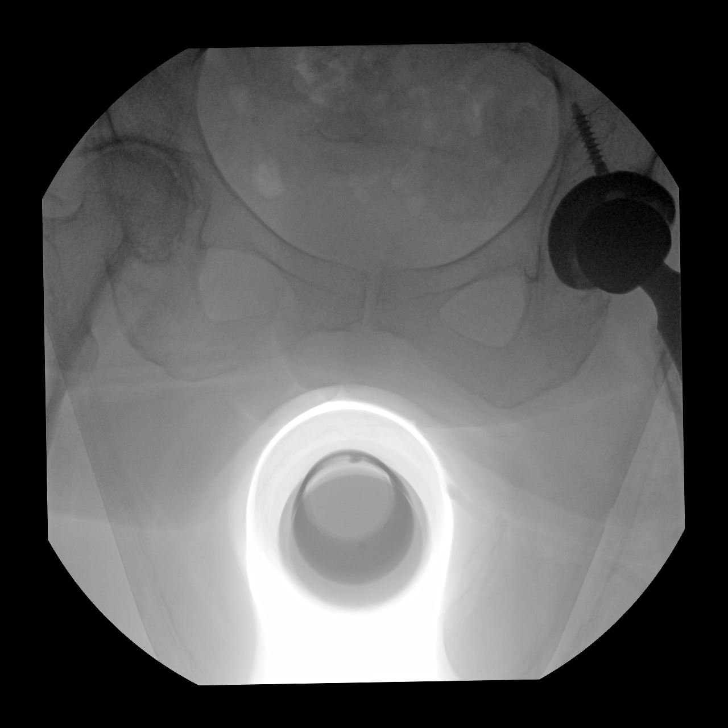
[im 4/10]
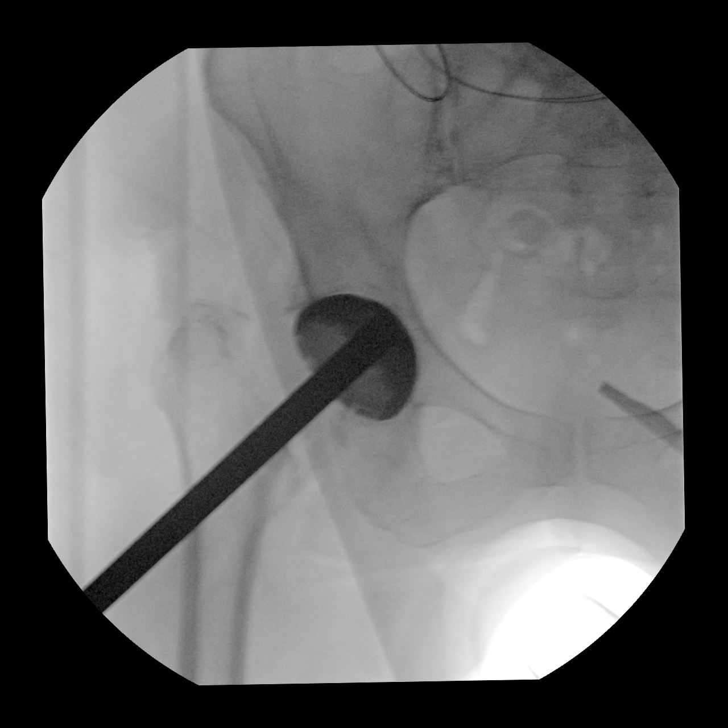
[im 5/10]
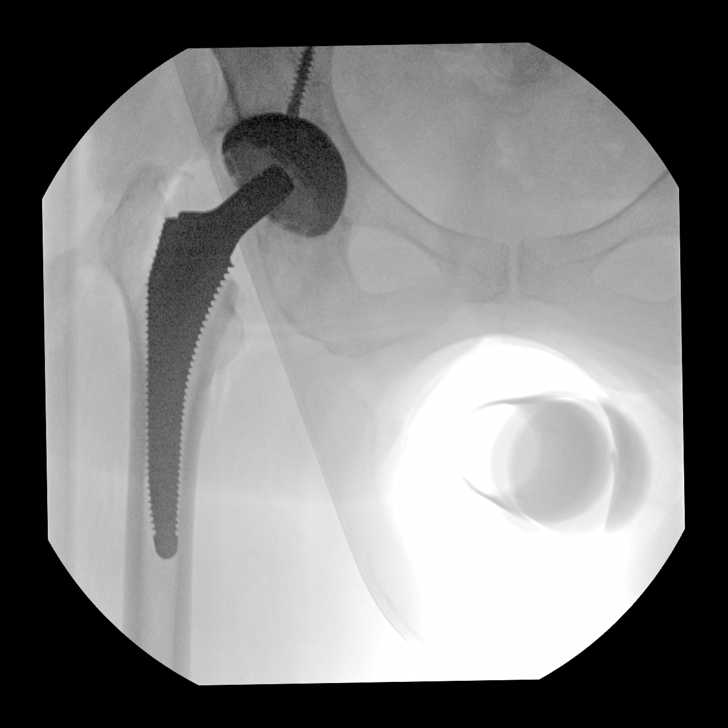
[im 6/10]
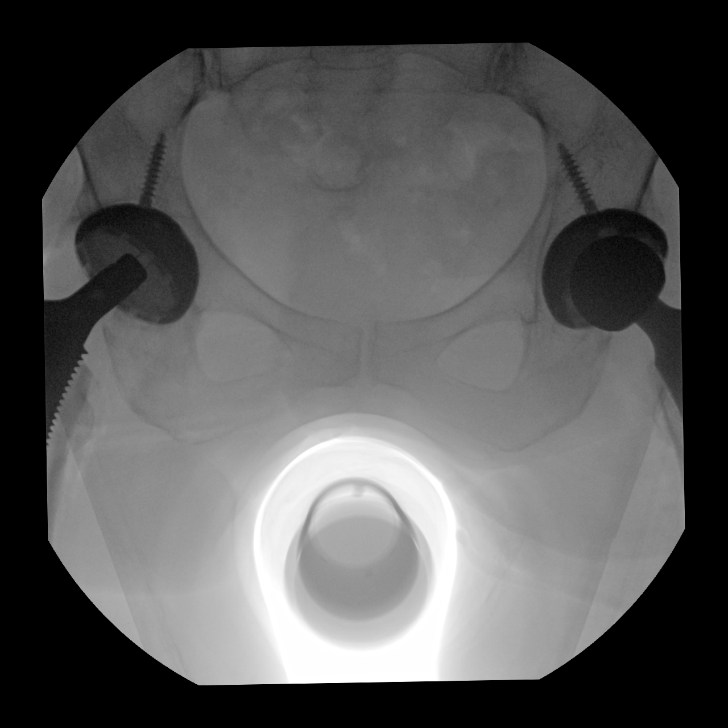
[im 7/10]
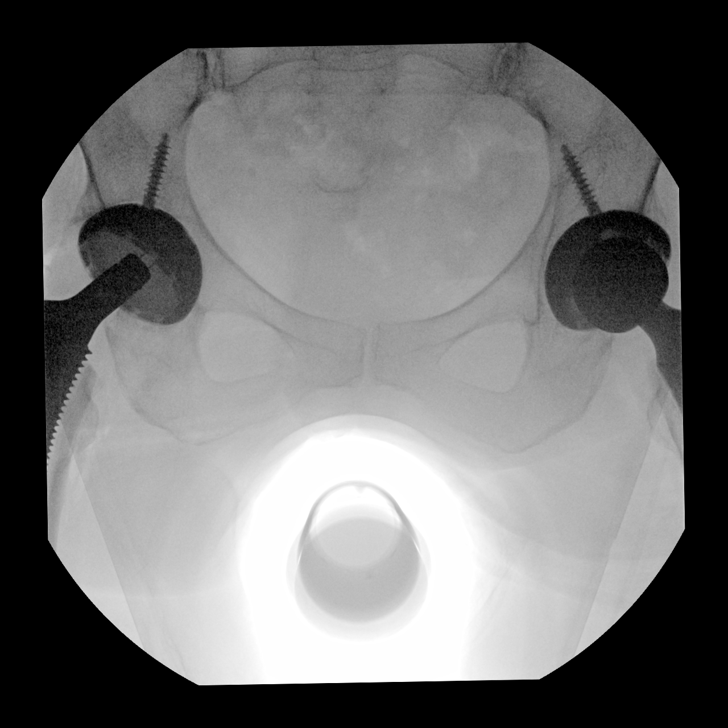
[im 8/10]
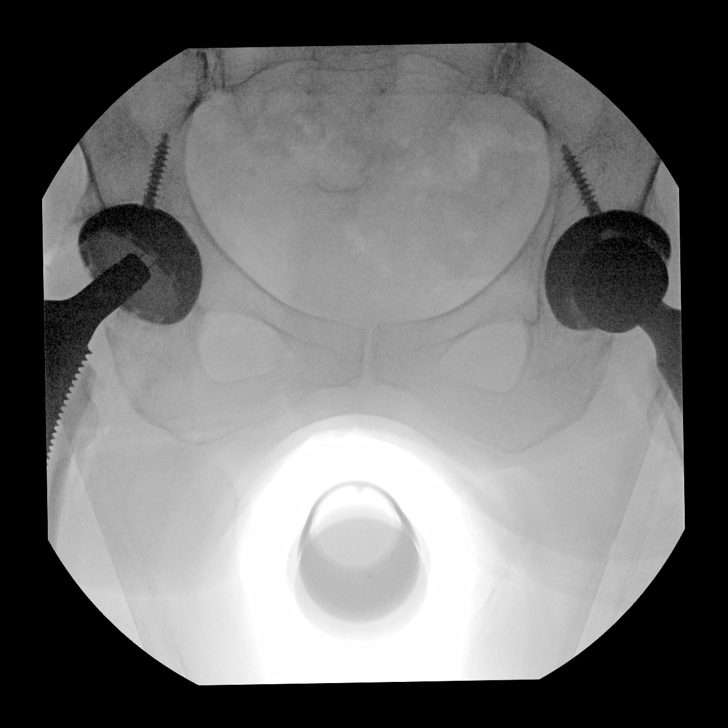
[im 9/10]
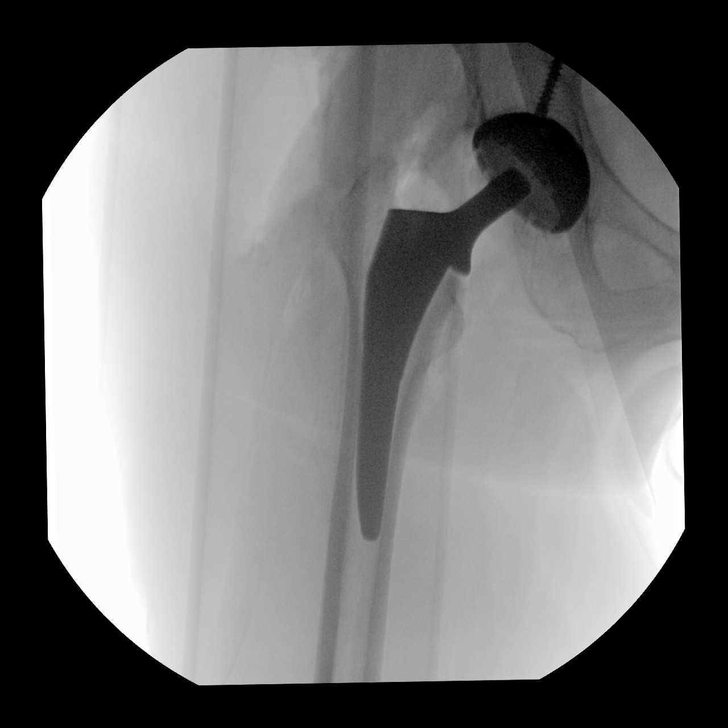
[im 10/10]
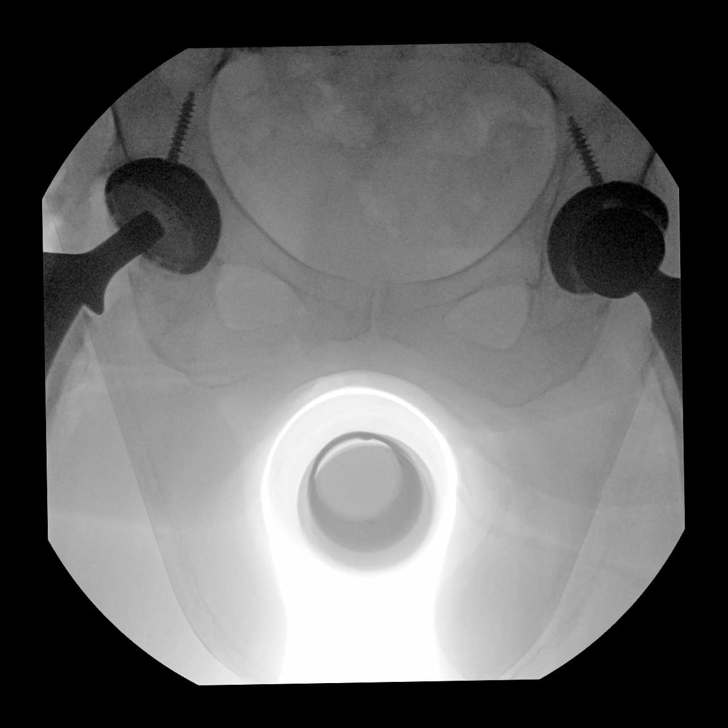

[10 of 10 positions shown; findings below may reference images not displayed]

FINDINGS: Replacement with anatomic alignment.  Hardware intact
IMPRESSION: Total right hip replacement with anatomic alignment.

## 2021-06-22 NOTE — Progress Notes (Signed)
?Radiation Oncology         [phone] ?________________________________ ? ?Name: Tonya Bartlett MRN: [id number]  ?Date: 06/23/2021  DOB: 1945-05-14 ? ?Follow-Up Visit Note ? ?CC: [name], NP  [name], NP ? ?  ICD-10-CM   ?1. Ductal carcinoma in situ (DCIS) of left breast  D05.12   ?  ? ? 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 ?Interval Since Last Radiation: 2 months ? ?Intent: Curative ? ?Radiation Treatment Dates: 03/27/2021 through 04/23/2021 ?Site Technique Total Dose (Gy) Dose per Fx (Gy) Completed Fx Beam Energies  ?Breast, Left: Breast_L 3D 40.05/40.05 2.67 15/15 10XFFF  ?Breast, Left: Breast_L_Bst 3D 10/10 2 5/5 6X, 10X  ? ? ?Narrative:  The patient returns today for routine follow-up.  The patient tolerated radiation therapy relatively well. During her final weekly treatment check on 04/22/21, the patient reported swelling and tenderness to the left nipple, and achy left breast pain with itching. Physical exam performed on that same date revealed a brisk reaction to the left breast without any skin breakdown.  Radiation dermatitis was also noted in the upper inner aspect of the left breast. For her itching, I advised her to obtain some hydrocortisone cream.     ? ?Near the end of RT, the patient followed up with Dr. Lindi Adie on 04/21/21.  During which time, the patient reported feeling well other than mild radiation dermatitis.  Antiestrogen treatment options were discussed with the patient, and following discussion of the risks and benefits, the patient agreed to proceed with tamoxifen 5 mg x 5 years.  First dose on 04/30/21.  ? ?Otherwise, no significant interval history since the patent was last seen.  ? ?She reports chronic fatigue but does not related to her radiation therapy or tamoxifen.  She continues to have little soreness in the nipple areolar complex area but no pain.  She has occasional sharp  shooting pains within the breast.  She denies any problems with swelling in her left arm or hand. ? ?She reports tolerating tamoxifen well without any side effects. ?                    ? ?Allergies:  is allergic to otezla [apremilast], penicillins, erythromycin, morphine and related, nsaids, remicade [infliximab], tolmetin, and nickel. ? ?Meds: ?Current Outpatient Medications  ?Medication Sig Dispense Refill  ? acetaminophen (TYLENOL) 325 MG tablet Take 650 mg by mouth every 6 (six) hours as needed for moderate pain.    ? Antiseborrheic Products, Misc. (PROMISEB) CREA Uses topical daily as needed on her face    ? atorvastatin (LIPITOR) 20 MG tablet Take 1 tablet (20 mg total) by mouth daily. 90 tablet 3  ? betamethasone dipropionate 0.05 % lotion APPLY TO AFFECTED AREA EVERY DAY 60 mL 1  ? ELIQUIS 5 MG TABS tablet TAKE 1 TABLET BY MOUTH TWICE A DAY 180 tablet 1  ? famotidine (PEPCID) 20 MG tablet Take 1 tablet (20 mg total) by mouth daily as needed for heartburn or indigestion. (Patient taking differently: Take 20 mg by mouth daily.) 90 tablet 3  ? FIBER PO Take 1 capsule by mouth in the morning and at bedtime.    ? HYDROcodone-acetaminophen (NORCO/VICODIN) 5-325 MG tablet 1 tablet as needed    ? hydrocortisone cream 1 % Apply 1 application topically daily as needed for itching.    ? L-Methylfolate-B12-B6-B2 (CEREFOLIN) 07-17-48-5 MG TABS TAKE 1  TABLET BY MOUTH EVERY DAY 90 tablet 0  ? losartan (COZAAR) 100 MG tablet TAKE 1 TABLET BY MOUTH EVERY DAY 90 tablet 0  ? metoprolol tartrate (LOPRESSOR) 25 MG tablet Take 0.5 tablets (12.5 mg total) by mouth 2 (two) times daily. 60 tablet 3  ? omeprazole (PRILOSEC) 40 MG capsule Take 1 capsule (40 mg total) by mouth daily. 30 capsule 5  ? rizatriptan (MAXALT) 10 MG tablet Take 1 tablet (10 mg total) by mouth as needed for migraine. May repeat in 2 hours if needed 10 tablet 2  ? sulfamethoxazole-trimethoprim (BACTRIM,SEPTRA) 400-80 MG tablet Take 1 tablet by mouth daily. For  urethritis.    ? tamoxifen (NOLVADEX) 10 MG tablet TAKE 0.5 TABLETS BY MOUTH 2 TIMES DAILY. 90 tablet 3  ? traZODone (DESYREL) 50 MG tablet TAKE 1 TABLET BY MOUTH AT BEDTIME 90 tablet 1  ? venlafaxine XR (EFFEXOR-XR) 37.5 MG 24 hr capsule TAKE 1 CAPSULE BY MOUTH DAILY WITH BREAKFAST. 90 capsule 1  ? ?No current facility-administered medications for this encounter.  ? ? ?Physical Findings: ?The patient is in no acute distress. Patient is alert and oriented. ? height is _0  (1.651 m) and weight is 227 lb (103 kg). Her temporal temperature is 97.4 ?F (36.3 ?C) (abnormal). Her blood pressure is 141/56 (abnormal) and her pulse is 62. Her respiration is 18 and oxygen saturation is 97%. .  No significant changes. Lungs are clear to auscultation bilaterally. Heart has regular rate and rhythm. No palpable cervical, supraclavicular, or axillary adenopathy. Abdomen soft, non-tender, normal bowel sounds. ? ?Left Breast: no palpable mass, nipple discharge or bleeding.  Skin is healed well.  Very mild swelling within the breast.  No dominant mass appreciated breast nipple discharge or bleeding.  Mild hyperpigmentation changes.  The right breast area reveals no palpable mass nipple discharge or bleeding. ? ? ? ?Lab Findings: ?Lab Results  ?Component Value Date  ? WBC 3.7 06/11/2021  ? HGB 12.2 06/11/2021  ? HCT 36.3 06/11/2021  ? MCV 98 (H) 06/11/2021  ? PLT 250 06/11/2021  ? ? ?Radiographic Findings: ?No results found. ? 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 ?She has recovered well from her radiation therapy.  No evidence of recurrence on clinical exam today. ? ?Plan: As needed follow-up in radiation oncology.  The patient will continue close follow-up in medical oncology and continue on tamoxifen. ? ? ?____________________________________ ? ?Blair Promise, PhD, MD ? ?This document serves as a record of services personally performed by  [name], MD. It was created on his behalf by Roney Mans, a trained medical scribe. The creation of this record is based on the scribe's personal observations and the provider's statements to them. This document has been checked and approved by the attending provider. ? ?

## 2021-06-23 ENCOUNTER — Encounter: Payer: Self-pay | Admitting: Radiation Oncology

## 2021-06-23 ENCOUNTER — Ambulatory Visit
Admission: RE | Admit: 2021-06-23 | Discharge: 2021-06-23 | Disposition: A | Discharge status: Home / Self Care | Payer: Medicare Other | Source: Ambulatory Visit | Attending: Radiation Oncology | Admitting: Radiation Oncology

## 2021-06-23 ENCOUNTER — Other Ambulatory Visit: Payer: Self-pay

## 2021-06-23 DIAGNOSIS — D0512 Intraductal carcinoma in situ of left breast: Secondary | ICD-10-CM | POA: Diagnosis not present

## 2021-06-23 NOTE — Progress Notes (Signed)
Tonya Bartlett is here today for follow up post radiation to the breast. ? ? Breast Side: Left ? ? ?They completed their radiation on: 04/23/21  ? ?Does the patient complain of any of the following: ?Post radiation skin issues: Skin intact.  ?Breast Tenderness: Patient reports sharp shooting pains at times. States nipple is tender.  ?Breast Swelling: No ?Lymphadema: No ?Range of Motion limitations: No ?Fatigue post radiation: Continues to have have a low energy level.  ?Appetite good/fair/poor: good ? ?Additional comments if applicable:  ? ?Vitals:  ? 06/23/21 1112  ?BP: (!) 141/56  ?Pulse: 62  ?Resp: 18  ?Temp: (!) 97.4 ?F (36.3 ?C)  ?TempSrc: Temporal  ?SpO2: 97%  ?Weight: 227 lb (103 kg)  ?Height: '5\' 5"'$  (1.651 m)  ?  ?

## 2021-06-25 ENCOUNTER — Ambulatory Visit (INDEPENDENT_AMBULATORY_CARE_PROVIDER_SITE_OTHER): Payer: Medicare Other | Admitting: Family Medicine

## 2021-06-25 ENCOUNTER — Encounter (INDEPENDENT_AMBULATORY_CARE_PROVIDER_SITE_OTHER): Payer: Self-pay | Admitting: Family Medicine

## 2021-06-25 ENCOUNTER — Other Ambulatory Visit: Payer: Self-pay | Admitting: Adult Health

## 2021-06-25 VITALS — BP 124/66 | HR 69 | Temp 98.1°F | Ht 65.0 in | Wt 221.0 lb

## 2021-06-25 DIAGNOSIS — Z6836 Body mass index (BMI) 36.0-36.9, adult: Secondary | ICD-10-CM

## 2021-06-25 DIAGNOSIS — E669 Obesity, unspecified: Secondary | ICD-10-CM | POA: Diagnosis not present

## 2021-06-25 DIAGNOSIS — Z760 Encounter for issue of repeat prescription: Secondary | ICD-10-CM

## 2021-06-25 DIAGNOSIS — E559 Vitamin D deficiency, unspecified: Secondary | ICD-10-CM

## 2021-06-25 DIAGNOSIS — E88819 Insulin resistance, unspecified: Secondary | ICD-10-CM

## 2021-06-25 DIAGNOSIS — E66812 Obesity, class 2: Secondary | ICD-10-CM

## 2021-06-25 DIAGNOSIS — E8881 Metabolic syndrome: Secondary | ICD-10-CM

## 2021-06-25 MED ORDER — VITAMIN D (ERGOCALCIFEROL) 1.25 MG (50000 UNIT) PO CAPS: 50000.0000 [IU] | ORAL_CAPSULE | ORAL | 0 refills | Status: DC

## 2021-06-25 MED ORDER — METFORMIN HCL 500 MG PO TABS: 500.0000 mg | ORAL_TABLET | Freq: Every day | ORAL | 0 refills | Status: DC

## 2021-06-26 NOTE — Progress Notes (Signed)
Chief Complaint:   OBESITY Tonya Bartlett (MR# [id number]) is a 76 y.o. female who presents for evaluation and treatment of obesity and related comorbidities. Current BMI is Body mass index is 36.94 kg/m. Tonya Bartlett has been struggling with her weight for many years and has been unsuccessful in either losing weight, maintaining weight loss, or reaching her healthy weight goal.  Tonya Bartlett has done many things in the past to lose weight. She is a retired Management consultant. She is going on vacation later this week.   Tonya Bartlett is currently in the action stage of change and ready to dedicate time achieving and maintaining a healthier weight. Tonya Bartlett is interested in becoming our patient and working on intensive lifestyle modifications including (but not limited to) diet and exercise for weight loss.  Tonya Bartlett's habits were reviewed today and are as follows: her desired weight loss is 62 lbs, she started gaining weight in her 40's, her heaviest weight ever was 234 pounds, she has significant food cravings issues, she snacks frequently in the evenings, she skips meals frequently, she frequently makes poor food choices, she frequently eats larger portions than normal, she has binge eating behaviors, and she struggles with emotional eating.  Depression Screen Gage's Food and Mood (modified PHQ-9) score was 11.     06/11/2021    8:54 AM  Depression screen PHQ 2/9  Decreased Interest 2  Down, Depressed, Hopeless 2  PHQ - 2 Score 4  Altered sleeping 0  Tired, decreased energy 3  Change in appetite 2  Feeling bad or failure about yourself  1  Trouble concentrating 0  Moving slowly or fidgety/restless 1  Suicidal thoughts 0  PHQ-9 Score 11   Subjective:   1. Other fatigue Tonya Bartlett admits to daytime somnolence and admits to waking up still tired. Patient has a history of symptoms of daytime fatigue and morning headache. Tonya Bartlett generally gets 7 or 9 hours of sleep per night, and states that she has  generally restful sleep. Snoring is present. Apneic episodes are not present. Epworth Sleepiness Score is 14.   2. SOB (shortness of breath) on exertion [name] notes increasing shortness of breath with exercising and seems to be worsening over time with weight gain. She notes getting out of breath sooner with activity than she used to. This has not gotten worse recently. Tonya Bartlett denies shortness of breath at rest or orthopnea.  3. Primary hypertension Tonya Bartlett's blood pressure is elevated today, but she is fasting and hasn't had her medications yet.  4. Mixed hyperlipidemia Tonya Bartlett is on statin, and she is working on diet and exercise.   5. Hyperglycemia Tonya Bartlett has a history of intermittently elevated glucose, but has a normal A1c.  6. Vitamin D deficiency Karisa is at high risk of Vitamin D deficiency.  Assessment/Plan:   1. Other fatigue Tonya Bartlett does feel that her weight is causing her energy to be lower than it should be. Fatigue may be related to obesity, depression or many other causes. Labs will be ordered, and in the meanwhile, Tonya Bartlett will focus on self care including making healthy food choices, increasing physical activity and focusing on stress reduction.  - CBC with Differential/Platelet - EKG 12-Lead - Folate - T3, free - T4, free - TSH - Vitamin B12  2. SOB (shortness of breath) on exertion Tonya Bartlett does feel that she gets out of breath more easily that she used to when she exercises. Tonya Bartlett's shortness of breath appears to be obesity related and exercise induced. She  has agreed to work on weight loss and gradually increase exercise to treat her exercise induced shortness of breath. Will continue to monitor closely.  3. Primary hypertension We will check labs today. Tonya Bartlett will start her Category 2 plan. She will continue working on healthy weight loss and exercise to improve blood pressure control. We will watch for signs of hypotension as she continues her lifestyle  modifications.  - Comprehensive metabolic panel  4. Mixed hyperlipidemia Cardiovascular risk and specific lipid/LDL goals reviewed. We discussed several lifestyle modifications today. We will check labs today. Tonya Bartlett will start her Category 2 plan. Orders and follow up as documented in patient record.   - Lipid panel  5. Hyperglycemia Fasting labs will be obtained and results with be discussed with [name] in 2 weeks at her follow up visit. In the meanwhile Tonya Bartlett will start on her Category 2 plan and will work on weight loss efforts.  - Comprehensive metabolic panel - Hemoglobin A1c - Insulin, random  6. Vitamin D deficiency Low Vitamin D level contributes to fatigue and are associated with obesity, breast, and colon cancer. We will check labs today. Tonya Bartlett will follow-up for routine testing of Vitamin D, at least 2-3 times per year to avoid over-replacement.  - VITAMIN D 25 Hydroxy (Vit-D Deficiency, Fractures)  7. Screening for depression Tonya Bartlett had a positive depression screening. Depression is commonly associated with obesity and often results in emotional eating behaviors. We will monitor this closely and work on CBT to help improve the non-hunger eating patterns. Referral to Psychology may be required if no improvement is seen as she continues in our clinic.  8. Obesity with the current BMI of 36.9 Tonya Bartlett is currently in the action stage of change and her goal is to continue with weight loss efforts. I recommend Tonya Bartlett begin the structured treatment plan as follows:  She has agreed to the Category 2 Plan.  Exercise goals: No exercise has been prescribed for now, while we concentrate on nutritional changes.  Behavioral modification strategies: no skipping meals and travel eating strategies.  She was informed of the importance of frequent follow-up visits to maximize her success with intensive lifestyle modifications for her multiple health conditions. She was informed we would  discuss her lab results at her next visit unless there is a critical issue that needs to be addressed sooner. Tonya Bartlett agreed to keep her next visit at the agreed upon time to discuss these results.  Objective:   Blood pressure (!) 147/74, pulse 60, temperature (!) 97.5 F (36.4 C), temperature source Oral, height '5\' 5"'$  (1.651 m), weight 222 lb (100.7 kg), SpO2 98 %. Body mass index is 36.94 kg/m.  EKG: Normal sinus rhythm, rate 60 BPM.  Indirect Calorimeter completed today shows a VO2 of 189 and a REE of 1296.  Her calculated basal metabolic rate is 9622 thus her basal metabolic rate is worse than expected.  General: Cooperative, alert, well developed, in no acute distress. HEENT: Conjunctivae and lids unremarkable. Cardiovascular: Regular rhythm.  Lungs: Normal work of breathing. Neurologic: No focal deficits.   Lab Results  Component Value Date   CREATININE 0.70 06/11/2021   BUN 11 06/11/2021   NA 141 06/11/2021   K 4.4 06/11/2021   CL 104 06/11/2021   CO2 25 06/11/2021   Lab Results  Component Value Date   ALT 17 06/11/2021   AST 20 06/11/2021   ALKPHOS 61 06/11/2021   BILITOT 0.6 06/11/2021   Lab Results  Component Value Date  HGBA1C 5.3 06/11/2021   HGBA1C 5.4 04/24/2021   HGBA1C 5.3 11/05/2016   Lab Results  Component Value Date   INSULIN 9.6 06/11/2021   Lab Results  Component Value Date   TSH 2.880 06/11/2021   Lab Results  Component Value Date   CHOL 166 06/11/2021   HDL 61 06/11/2021   LDLCALC 90 06/11/2021   LDLDIRECT 187.9 10/08/2009   TRIG 82 06/11/2021   CHOLHDL 2.7 06/11/2021   Lab Results  Component Value Date   WBC 3.7 06/11/2021   HGB 12.2 06/11/2021   HCT 36.3 06/11/2021   MCV 98 (H) 06/11/2021   PLT 250 06/11/2021   No results found for: IRON, TIBC, FERRITIN Obesity Behavioral Intervention:   Approximately 15 minutes were spent on the discussion below.  ASK: We discussed the diagnosis of obesity with [name] today and Taura  agreed to give [name] permission to discuss obesity behavioral modification therapy today.  ASSESS: Mikaila has the diagnosis of obesity and her BMI today is 36.9. Massa is in the action stage of change.   ADVISE: Alania was educated on the multiple health risks of obesity as well as the benefit of weight loss to improve her health. She was advised of the need for long term treatment and the importance of lifestyle modifications to improve her current health and to decrease her risk of future health problems.  AGREE: Multiple dietary modification options and treatment options were discussed and Virna agreed to follow the recommendations documented in the above note.  ARRANGE: Pamila was educated on the importance of frequent visits to treat obesity as outlined per CMS and USPSTF guidelines and agreed to schedule her next follow up appointment today.  Attestation Statements:   Reviewed by clinician on day of visit: allergies, medications, problem list, medical history, surgical history, family history, social history, and previous encounter notes.   I, Durga Saldarriaga, am acting as transcriptionist for [name], MD.  I have reviewed the above documentation for accuracy and completeness, and I agree with the above. - [name], MD

## 2021-07-01 ENCOUNTER — Other Ambulatory Visit (INDEPENDENT_AMBULATORY_CARE_PROVIDER_SITE_OTHER): Payer: Self-pay | Admitting: Family Medicine

## 2021-07-01 DIAGNOSIS — E8881 Metabolic syndrome: Secondary | ICD-10-CM

## 2021-07-08 ENCOUNTER — Telehealth: Payer: Self-pay | Admitting: Pharmacist

## 2021-07-08 NOTE — Chronic Care Management (AMB) (Signed)
    Chronic Care Management Pharmacy Assistant   Name: Tonya Bartlett  MRN: [id number] DOB: 1945/09/13 07/08/21 APPOINTMENT REMINDER   Called Patient No answer, left message of appointment on 07/09/21 at 1:30 via telephone visit with Jeni Salles, Pharm D.   Notified to have all medications, supplements, blood pressure and/or blood sugar logs available during appointment and to return call if need to reschedule.     Care Gaps: BP- 124/66 ( 06/25/21) AWV- 04/18/21  Star Rating Drug: Losartan '100mg'$  - Last filled 06/25/21 90 [facility] Atorvastatin 20 mg - Last filled 06/03/21 90 [facility] at [facility]    Medications: Outpatient Encounter Medications as of 07/08/2021  Medication Sig   acetaminophen (TYLENOL) 325 MG tablet Take 650 mg by mouth every 6 (six) hours as needed for moderate pain.   Antiseborrheic Products, Misc. (PROMISEB) CREA Uses topical daily as needed on her face   atorvastatin (LIPITOR) 20 MG tablet Take 1 tablet (20 mg total) by mouth daily.   betamethasone dipropionate 0.05 % lotion APPLY TOPICALLY TO AFFECTED AREA EVERY DAY   ELIQUIS 5 MG TABS tablet TAKE 1 TABLET BY MOUTH TWICE A DAY   famotidine (PEPCID) 20 MG tablet Take 1 tablet (20 mg total) by mouth daily as needed for heartburn or indigestion. (Patient taking differently: Take 20 mg by mouth daily.)   FIBER PO Take 1 capsule by mouth in the morning and at bedtime.   HYDROcodone-acetaminophen (NORCO/VICODIN) 5-325 MG tablet 1 tablet as needed   hydrocortisone cream 1 % Apply 1 application topically daily as needed for itching.   L-Methylfolate-B12-B6-B2 (CEREFOLIN) 07-17-48-5 MG TABS TAKE 1 TABLET BY MOUTH EVERY DAY   losartan (COZAAR) 100 MG tablet TAKE 1 TABLET BY MOUTH EVERY DAY   metFORMIN (GLUCOPHAGE) 500 MG tablet Take 1 tablet (500 mg total) by mouth daily with breakfast.   metoprolol tartrate (LOPRESSOR) 25 MG tablet Take 0.5 tablets (12.5 mg total) by mouth 2 (two) times daily.   omeprazole (PRILOSEC) 40 MG capsule  Take 1 capsule (40 mg total) by mouth daily.   rizatriptan (MAXALT) 10 MG tablet Take 1 tablet (10 mg total) by mouth as needed for migraine. May repeat in 2 hours if needed   sulfamethoxazole-trimethoprim (BACTRIM,SEPTRA) 400-80 MG tablet Take 1 tablet by mouth daily. For urethritis.   tamoxifen (NOLVADEX) 10 MG tablet TAKE 0.5 TABLETS BY MOUTH 2 TIMES DAILY.   traZODone (DESYREL) 50 MG tablet TAKE 1 TABLET BY MOUTH AT BEDTIME   venlafaxine XR (EFFEXOR-XR) 37.5 MG 24 hr capsule TAKE 1 CAPSULE BY MOUTH DAILY WITH BREAKFAST.   Vitamin D, Ergocalciferol, (DRISDOL) 1.25 MG (50000 UNIT) CAPS capsule Take 1 capsule (50,000 Units total) by mouth every 7 (seven) days.   No facility-administered encounter medications on file as of 07/08/2021.        [place] Clinical Pharmacist Assistant [phone]

## 2021-07-09 ENCOUNTER — Telehealth: Payer: Self-pay | Admitting: Pharmacist

## 2021-07-09 ENCOUNTER — Telehealth: Payer: Medicare Other

## 2021-07-09 NOTE — Progress Notes (Signed)
Chief Complaint:   OBESITY Euva is here to discuss her progress with her obesity treatment plan along with follow-up of her obesity related diagnoses. [name] is on the Category 2 Plan and states she is following her eating plan approximately 0% of the time. Zunaira states she is doing 0 minutes 0 times per week.  Today's visit was #: 2 Starting weight: 222 lbs Starting date: 06/11/2021 Today's weight: 221 lbs Today's date: 06/25/2021 Total lbs lost to date: 1 Total lbs lost since last in-office visit: 1  Interim History: Dawnette was on vacation much of the time in between visits. She hasn't been able to follow her plan closely, but she was still mindful and tried to make good decisions.   Subjective:   1. Vitamin D deficiency Andora is on OTC Vitamin D, but her level is not yet at goal. She notes fatigue. I discussed labs with the patient today.   2. Insulin resistance Mycala has a new diagnosis. Her A1c and glucose are within normal limits, but fasting insulin is greater than 5 suggesting insulin resistance. I discussed labs with the patient today.   Assessment/Plan:   1. Vitamin D deficiency Hisako agreed to start prescription Vitamin D 50,000 IU every week with no refills. She will follow-up for routine testing of Vitamin D, at least 2-3 times per year to avoid over-replacement.  - Vitamin D, Ergocalciferol, (DRISDOL) 1.25 MG (50000 UNIT) CAPS capsule; Take 1 capsule (50,000 Units total) by mouth every 7 (seven) days.  Dispense: 4 capsule; Refill: 0  2. Insulin resistance Mirza agreed to start metformin 500 mg q AM with food, with no refills. She will continue her diet and exercise.   - metFORMIN (GLUCOPHAGE) 500 MG tablet; Take 1 tablet (500 mg total) by mouth daily with breakfast.  Dispense: 30 tablet; Refill: 0  3. Obesity, Current BMI 36.9 Cordelia is currently in the action stage of change. As such, her goal is to continue with weight loss efforts. She has agreed to  the Category 2 Plan.   Behavioral modification strategies: increasing lean protein intake and meal planning and cooking strategies.  Katreena has agreed to follow-up with our clinic in 2 to 3 weeks. She was informed of the importance of frequent follow-up visits to maximize her success with intensive lifestyle modifications for her multiple health conditions.   Objective:   Blood pressure 124/66, pulse 69, temperature 98.1 F (36.7 C), height '5\' 5"'$  (1.651 m), weight 221 lb (100.2 kg), SpO2 99 %. Body mass index is 36.78 kg/m.  General: Cooperative, alert, well developed, in no acute distress. HEENT: Conjunctivae and lids unremarkable. Cardiovascular: Regular rhythm.  Lungs: Normal work of breathing. Neurologic: No focal deficits.   Lab Results  Component Value Date   CREATININE 0.70 06/11/2021   BUN 11 06/11/2021   NA 141 06/11/2021   K 4.4 06/11/2021   CL 104 06/11/2021   CO2 25 06/11/2021   Lab Results  Component Value Date   ALT 17 06/11/2021   AST 20 06/11/2021   ALKPHOS 61 06/11/2021   BILITOT 0.6 06/11/2021   Lab Results  Component Value Date   HGBA1C 5.3 06/11/2021   HGBA1C 5.4 04/24/2021   HGBA1C 5.3 11/05/2016   Lab Results  Component Value Date   INSULIN 9.6 06/11/2021   Lab Results  Component Value Date   TSH 2.880 06/11/2021   Lab Results  Component Value Date   CHOL 166 06/11/2021   HDL 61 06/11/2021  LDLCALC 90 06/11/2021   LDLDIRECT 187.9 10/08/2009   TRIG 82 06/11/2021   CHOLHDL 2.7 06/11/2021   Lab Results  Component Value Date   VD25OH 34.3 06/11/2021   Lab Results  Component Value Date   WBC 3.7 06/11/2021   HGB 12.2 06/11/2021   HCT 36.3 06/11/2021   MCV 98 (H) 06/11/2021   PLT 250 06/11/2021   No results found for: IRON, TIBC, FERRITIN  Obesity Behavioral Intervention:   Approximately 15 minutes were spent on the discussion below.  ASK: We discussed the diagnosis of obesity with [name] today and Braylon agreed to give [name]  permission to discuss obesity behavioral modification therapy today.  ASSESS: Zharia has the diagnosis of obesity and her BMI today is 36.9. Promyse is in the action stage of change.   ADVISE: Candid was educated on the multiple health risks of obesity as well as the benefit of weight loss to improve her health. She was advised of the need for long term treatment and the importance of lifestyle modifications to improve her current health and to decrease her risk of future health problems.  AGREE: Multiple dietary modification options and treatment options were discussed and Jaunice agreed to follow the recommendations documented in the above note.  ARRANGE: Sharlynn was educated on the importance of frequent visits to treat obesity as outlined per CMS and USPSTF guidelines and agreed to schedule her next follow up appointment today.  Attestation Statements:   Reviewed by clinician on day of visit: allergies, medications, problem list, medical history, surgical history, family history, social history, and previous encounter notes.   I, Candas Deemer, am acting as transcriptionist for [name], MD.  I have reviewed the above documentation for accuracy and completeness, and I agree with the above. -  [name], MD

## 2021-07-09 NOTE — Telephone Encounter (Signed)
  Chronic Care Management   Outreach Note  07/09/2021 Name: AANIKA DEFOOR MRN: [id number] DOB: Nov 02, 1945  Referred by: [name], NP  Patient had a phone appointment scheduled with clinical pharmacist today.  An unsuccessful telephone outreach was attempted today. The patient was referred to the pharmacist for assistance with care management and care coordination.   If possible, a message was left to return call to: [phone] or to [facility]: [place], PharmD, [place] Pharmacist [facility] [place]

## 2021-07-09 NOTE — Progress Notes (Unsigned)
Chronic Care Management Pharmacy Note  07/09/2021 Name:  Tonya Bartlett MRN:  [id number] DOB:  November 29, 1945  Summary: BP not ideally at goal < 140/90 LDL not at goal < 70  Recommendations/Changes made from today's visit: -Recommended bringing BP cuff to next office visit -Recommend initiation of high intensity statin based on elevated CAC score and LDL goal < 70 -Recommend repeat DEXA -Recommended trial of meclizine for vertigo symptoms  Plan: BP assessment in 2-3 months   Subjective: Tonya Bartlett is an 76 y.o. year old female who is a primary patient of [name], NP.  The CCM team was consulted for assistance with disease management and care coordination needs.    Engaged with patient by telephone for follow up visit in response to provider referral for pharmacy case management and/or care coordination services.   Consent to Services:  The patient was given information about Chronic Care Management services, agreed to services, and gave verbal consent prior to initiation of services.  Please see initial visit note for detailed documentation.   Patient Care Team: [name], NP as PCP - General (Family Medicine) [name], MD as PCP - Cardiology (Cardiology) [name], MD (Inactive) as Consulting Physician (Urology) [facility], [facility] (Specialist) [name], [facility] as Pharmacist (Pharmacist) [name], RN as Oncology Nurse Navigator [name], RN as Oncology Nurse Navigator [name], MD as Consulting Physician (General Surgery) [name], MD as Consulting Physician (Hematology and Oncology) [name], MD as Consulting Physician (Radiation Oncology)  Recent office visits: 04/24/21 [name], NP: Patient presented for annual exam. No medication changes.  04/18/21 Rolene Arbour, LPN: Patient presented for AWV.  Recent consult visits: 07/04/21 Leigh Aurora, MD (rheumatology): Patient presented for disc degeneration follow  up. Unable to access notes.  06/25/21 [name], MD (weight management): Patient presented for weight management follow up. Prescribed metformin 500 mg daily and vitamin D 50000 units once weekly. Follow up in 3 weeks.  06/11/21 [name], MD (weight management): Patient presented for weight management initial consult. Follow up in 2 weeks.  06/06/21 Leigh Aurora, MD (rheumatology): Patient presented for disc degeneration follow up. Unable to access notes.  05/14/21 [name], MD (psychiatry): Patient presented for depression follow up. Follow up in 3 months.  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 Adjuvant radiation therapy to be completed 04/25/2021 and prescribed tamoxifen 5 mg daily.  04/02/21 [name], MD (psychiatry): Patient presented for depression follow up. Switched to Effexor and tapered off Lexapro. Follow up in 1 month.  03/21/21 Zehr, Laban Emperor, PA-C Gertie Fey) - Patient presented for Dysphagia and other concerns. Prescribed Omeprazole 40 mg.   03/19/21 [name] MD (Rad Onc) - Patient presented to [facility] for [place], Consult and Nurse Eval. No medication changes.   03/17/21 Georgianne Fick, MD (General Surg) - Patient presented for Pre-op visit. No medication changes.   03/13/21 Sherran Needs, NP ( Cardiology) - Patient presented for PAF and other concerns. No medication changes.   03/11/21 [name], MD (Oncology) - Patient presented for Ductal carcinoma in situ of left breast. Stopped Hydrocodone - Acetaminophen.   03/07/21 Leigh Aurora (Rheumatology) - Patient presented for follow up. No medication changes.   02/25/21 [name], MD (Imaging) - Patient presented for MM Breast Stereo Wire   02/12/21 [name], MD - Patient presented to [facility] for [place].   02/06/21  Sherran Needs, NP (Cardiology) - Patient presented for  PAF and other concerns. Prescribed Metoprolol Tartrate.   01/15/21 [name], MD (Oncology) - Patient presented for Ductal carcinoma in situ of left breast. Stopped Calcium - Magnesium - Zinc, Cholecalciferol and Ketoconazole.   01/15/21 [name], MD (Rad Onc) - Patient presented for Ductal carcinoma in situ of left breast. No medication changes.  01/06/21  Patient presented to [facility] for Breast Calcification.   12/25/20 [name], MD (Cardiology) - Patient presented for Mixed Hyperlipidemia and other concerns. Prescribed Atorvastatin 20 mg.   12/19/20 Garvin Fila, MD (Neurology) - Patient presented for Dizziness and other concerns. Stopped L- Methylfolate B-12 and Tramadol.   Hospital visits: Medication Reconciliation was completed by comparing discharge summary, patient's EMR and Pharmacy list, and upon discussion with patient.   Patient presented to [facility] on 02/27/21 due to Left Breast Lumpectomy with radioactive seed localization. Patient was present for 5 hours.   New?Medications Started at [facility] Discharge:?? -started  None   Medication Changes at Hospital Discharge: -Changed  None   Medications Discontinued at Hospital Discharge: -Stopped  None   Medications that remain the same after Hospital Discharge:??  -All other medications will remain the same.     Objective:  Lab Results  Component Value Date   CREATININE 0.70 06/11/2021   BUN 11 06/11/2021   GFR 84.47 04/24/2021   GFRNONAA >60 02/12/2021   GFRAA >60 08/02/2019   NA 141 06/11/2021   K 4.4 06/11/2021   CALCIUM 9.2 06/11/2021   CO2 25 06/11/2021   GLUCOSE 99 06/11/2021    Lab Results  Component Value Date/Time   HGBA1C 5.3 06/11/2021 10:00 AM   HGBA1C 5.4 04/24/2021 09:15 AM   GFR 84.47 04/24/2021 09:15 AM   GFR 87.17 07/23/2020 04:56 PM    Last diabetic Eye exam: No results found for: HMDIABEYEEXA  Last diabetic Foot exam: No results found  for: HMDIABFOOTEX   Lab Results  Component Value Date   CHOL 166 06/11/2021   HDL 61 06/11/2021   LDLCALC 90 06/11/2021   LDLDIRECT 187.9 10/08/2009   TRIG 82 06/11/2021   CHOLHDL 2.7 06/11/2021       Latest Ref Rng & Units 06/11/2021   10:00 AM 04/24/2021    9:15 AM 01/15/2021   12:45 PM  Hepatic Function  Total Protein 6.0 - 8.5 g/dL 6.9   7.3   7.4    Albumin 3.7 - 4.7 g/dL 4.4   4.4   4.4    AST 0 - 40 IU/L '20   21   22    ' ALT 0 - 32 IU/L '17   19   19    ' Alk Phosphatase 44 - 121 IU/L 61   65   68    Total Bilirubin 0.0 - 1.2 mg/dL 0.6   0.8   0.9      Lab Results  Component Value Date/Time   TSH 2.880 06/11/2021 10:00 AM   TSH 2.57 04/24/2021 09:15 AM   FREET4 1.24 06/11/2021 10:00 AM       Latest Ref Rng & Units 06/11/2021   10:00 AM 04/24/2021    9:15 AM 02/12/2021    2:33 PM  CBC  WBC 3.4 - 10.8 x10E3/uL 3.7   4.9   6.9    Hemoglobin 11.1 - 15.9 g/dL 12.2   13.2   12.6    Hematocrit 34.0 - 46.6 % 36.3   39.3  38.6    Platelets 150 - 450 x10E3/uL 250   236.0   268      Lab Results  Component Value Date/Time   VD25OH 34.3 06/11/2021 10:00 AM    Clinical ASCVD: No  The 10-year ASCVD risk score (Arnett DK, et al., 2019) is: 21.9%   Values used to calculate the score:     Age: 17 years     Sex: Female     Is Non-Hispanic African American: No     Diabetic: No     Tobacco smoker: No     Systolic Blood Pressure: 726 mmHg     Is BP treated: Yes     HDL Cholesterol: 61 mg/dL     Total Cholesterol: 166 mg/dL       06/11/2021    8:54 AM 04/24/2021    9:15 AM 04/18/2021    3:34 PM  Depression screen PHQ 2/9  Decreased Interest 2 2 0  Down, Depressed, Hopeless 2 1 0  PHQ - 2 Score 4 3 0  Altered sleeping 0 2   Tired, decreased energy 3 2   Change in appetite 2 3   Feeling bad or failure about yourself  1 3   Trouble concentrating 0 1   Moving slowly or fidgety/restless 1 0   Suicidal thoughts 0 0   PHQ-9 Score 11 14   Difficult doing work/chores   Somewhat difficult      Social History   Tobacco Use  Smoking Status Former   Packs/day: 0.50   Years: 15.00   Pack years: 7.50   Types: Cigarettes   Quit date: 05/15/1985   Years since quitting: 36.1  Smokeless Tobacco Never   BP Readings from Last 3 Encounters:  06/25/21 124/66  06/23/21 (!) 141/56  06/11/21 (!) 147/74   Pulse Readings from Last 3 Encounters:  06/25/21 69  06/23/21 62  06/11/21 60   Wt Readings from Last 3 Encounters:  06/25/21 221 lb (100.2 kg)  06/23/21 227 lb (103 kg)  06/11/21 222 lb (100.7 kg)   BMI Readings from Last 3 Encounters:  06/25/21 36.78 kg/m  06/23/21 37.77 kg/m  06/11/21 36.94 kg/m    Assessment/Interventions: Review of patient past medical history, allergies, medications, health status, including review of consultants reports, laboratory and other test data, was performed as part of comprehensive evaluation and provision of chronic care management services.   SDOH:  (Social Determinants of Health) assessments and interventions performed: No  SDOH Screenings   Alcohol Screen: Low Risk    Last Alcohol Screening Score (AUDIT): 0  Depression (PHQ2-9): Medium Risk   PHQ-2 Score: 11  Financial Resource Strain: Low Risk    Difficulty of Paying Living Expenses: Not hard at all  Food Insecurity: No Food Insecurity   Worried About Charity fundraiser in the Last Year: Never true   Ran Out of Food in the Last Year: Never true  Housing: Low Risk    Last Housing Risk Score: 0  Physical Activity: Inactive   Days of Exercise per Week: 0 days   Minutes of Exercise per Session: 0 min  Social Connections: Moderately Integrated   Frequency of Communication with Friends and Family: More than three times a week   Frequency of Social Gatherings with Friends and Family: More than three times a week   Attends Religious Services: More than 4 times per year   Active Member of [employer] or Organizations: Yes   Attends Archivist Meetings:  More than 4 times per year   Marital Status: Widowed  Stress: No Stress Concern Present   Feeling of Stress : Not at all  Tobacco Use: Medium Risk   Smoking Tobacco Use: Former   Smokeless Tobacco Use: Never   Passive Exposure: Not on file  Transportation Needs: No Transportation Needs   Lack of Transportation (Medical): No   Lack of Transportation (Non-Medical): No    CCM Care Plan  Allergies  Allergen Reactions   Otezla [Apremilast] Anaphylaxis    Suicidal ideation   Penicillins Anaphylaxis    Has patient had a PCN reaction causing immediate rash, facial/tongue/throat swelling, SOB or lightheadedness with hypotension: Yes Has patient had a PCN reaction causing severe rash involving mucus membranes or skin necrosis: Yes Has patient had a PCN reaction that required hospitalization: No Has patient had a PCN reaction occurring within the last 10 year No If all of the above answers are "NO", then may proceed with Cephalosporin use.    Erythromycin Other (See Comments)    SEVERE STOMACH CRAMPS   Morphine And Related Nausea And Vomiting   Nsaids Other (See Comments)    SEVERE STOMACH CRAMPS, MOUTH SORES **Able to tolerate Tylenol   Remicade [Infliximab] Other (See Comments)    Shut down immune system-BP high   Tolmetin     SEVERE STOMACH CRAMPS   Nickel Rash    Including snaps on hospital gowns     Medications Reviewed Today     Reviewed by Ronaldo Miyamoto, CMA (Certified Medical Assistant) on 06/25/21 at 1352  Med List Status: <None>   Medication Order Taking? Sig Documenting Provider Last Dose Status Informant  acetaminophen (TYLENOL) 325 MG tablet [id number]  Take 650 mg by mouth every 6 (six) hours as needed for moderate pain. [provider]  Active Self  Antiseborrheic Products, [place]. (PROMISEB) CREA [id number]  Uses topical daily as needed on her face [provider]  Active Self  atorvastatin (LIPITOR) 20 MG tablet [id number]  Take 1 tablet (20 mg  total) by mouth daily. [name], MD  Expired 03/25/21 2359 Self  betamethasone dipropionate 0.05 % lotion [id number]  APPLY TOPICALLY TO AFFECTED AREA EVERY DAY Nafziger, Tommi Rumps, NP  Active   ELIQUIS 5 MG TABS tablet [id number]  TAKE 1 TABLET BY MOUTH TWICE A DAY [name], MD  Active   famotidine (PEPCID) 20 MG tablet [id number]  Take 1 tablet (20 mg total) by mouth daily as needed for heartburn or indigestion.  Patient taking differently: Take 20 mg by mouth daily.   Yetta Flock, MD  Active Self  FIBER PO [id number]  Take 1 capsule by mouth in the morning and at bedtime. [provider]  Active Self  HYDROcodone-acetaminophen (NORCO/VICODIN) 5-325 MG tablet [id number]  1 tablet as needed [provider]  Active   hydrocortisone cream 1 % [id number]  Apply 1 application topically daily as needed for itching. [provider]  Active Self  L-Methylfolate-B12-B6-B2 (CEREFOLIN) 07-17-48-5 MG TABS [id number]  TAKE 1 TABLET BY MOUTH EVERY DAY Suzzanne Cloud, NP  Active   losartan (COZAAR) 100 MG tablet [id number]  TAKE 1 TABLET BY MOUTH EVERY DAY Nafziger, Tommi Rumps, NP  Active   metoprolol tartrate (LOPRESSOR) 25 MG tablet [id number]  Take 0.5 tablets (12.5 mg total) by mouth 2 (two) times daily. Sherran Needs, NP  Expired 05/07/21 2359   omeprazole (PRILOSEC) 40 MG capsule [id number]  Take 1 capsule (40 mg total) by mouth daily.  Zehr, Laban Emperor, PA-C  Active   rizatriptan (MAXALT) 10 MG tablet [id number]  Take 1 tablet (10 mg total) by mouth as needed for migraine. May repeat in 2 hours if needed [name], NP  Active   sulfamethoxazole-trimethoprim (BACTRIM,SEPTRA) 400-80 MG tablet [id number]  Take 1 tablet by mouth daily. For urethritis. [name], MD  Active Self  tamoxifen (NOLVADEX) 10 MG tablet [id number]  TAKE 0.5 TABLETS BY MOUTH 2 TIMES DAILY. [name], MD  Active   traZODone (DESYREL) 50 MG tablet [id number]  TAKE 1 TABLET BY MOUTH AT BEDTIME  Nafziger, Tommi Rumps, NP  Active   venlafaxine XR (EFFEXOR-XR) 37.5 MG 24 hr capsule [id number]  TAKE 1 CAPSULE BY MOUTH DAILY WITH BREAKFAST. [name], MD  Active             Patient Active Problem List   Diagnosis Date Noted   Dysphagia 03/21/2021   Gastroesophageal reflux disease 03/21/2021   History of colon polyps 03/21/2021   Genetic testing 01/31/2021   Aortic atherosclerosis ([place]) 01/17/2021   Family history of melanoma 01/16/2021   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   PAF (paroxysmal atrial fibrillation) ([place]) 10/22/2020   Family history of heart disease 10/22/2020   Trimalleolar fracture 07/31/2020   Right hip OA 08/01/2019   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   Insomnia 02/03/2019   Onychomycosis 11/28/2018   Obese 05/05/2017   S/P left THA, 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   MCI (mild cognitive impairment) 09/19/2015   Memory deficit 09/19/2015   SIRS (systemic inflammatory response syndrome) ([place]) 10/29/2014   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   Depression 11/16/2013   ADD (attention deficit disorder) 11/17/2011   Essential hypertension, benign 10/15/2009   SEBORRHEIC KERATOSIS, INFLAMED 10/17/2008   CARPAL TUNNEL SYNDROME, LEFT 06/22/2008   Allergic rhinitis 08/30/2007   Hyperlipidemia 11/11/2006   Migraine 11/11/2006   Osteoarthritis 11/11/2006    Immunization History  Administered Date(s) Administered   Fluad Quad(high Dose 65+) 10/31/2018, 04/19/2021   Influenza Split 11/17/2011   Influenza Whole 10/15/2009   Influenza, High Dose Seasonal PF 01/02/2014, 11/06/2014, 11/15/2015, 11/05/2016, 12/02/2017   Influenza-Unspecified 10/17/2012   PFIZER(Purple Top)SARS-COV-2 Vaccination 03/25/2019, 04/18/2019, 11/03/2019   PNEUMOCOCCAL CONJUGATE-20 04/19/2021   Pfizer Covid-19 Vaccine Bivalent Booster 13yr & up 04/19/2021   Pneumococcal Conjugate-13 01/02/2014   Pneumococcal Polysaccharide-23 10/15/2009   Td 08/24/2006    Tdap 07/31/2020   Zoster Recombinat (Shingrix) 12/07/2018, 02/15/2019   Zoster, Live 02/23/2015   Patient is having lots of dizziness and she broke her ankle a few months ago. Patient is still unable to figure out what is going on with her with dizziness and this was the 10th fall within a few months.  Patient plans to go back on weight watchers and her weight is up to 220 lbs.  Conditions to be addressed/monitored:  Hypertension, Hyperlipidemia, GERD, Depression, Osteopenia, and Insomnia, Headaches  Conditions addressed this visit: Hypertension, hyperlipidemia, osteopenia***  There are no care plans that you recently modified to display for this patient.     Medication Assistance: None required.  Patient affirms current coverage meets needs.  Compliance/Adherence/Medication fill history: Care Gaps: Colonoscopy BP- 124/66 (06/25/21)  Star-Rating Drugs: Losartan 1077m- [facility] Atorvastatin 20 mg - [facility]/21 [facility]  [facility]  [id number] [id number]: [phone]: [phone] pill  box? Yes - weekly Pt endorses 95% compliance - sometimes she forgets to call in refills  We discussed: Current pharmacy is preferred with insurance plan and patient is satisfied with pharmacy services Patient decided to: Continue current medication management strategy  Care Plan and Follow Up Patient Decision:  Patient agrees to Care Plan and Follow-up.  Plan: Telephone follow up appointment with care management team member scheduled for:  6 months  Jeni Salles, PharmD, [facility] [phone]

## 2021-07-17 ENCOUNTER — Ambulatory Visit (INDEPENDENT_AMBULATORY_CARE_PROVIDER_SITE_OTHER): Payer: Medicare Other | Admitting: Family Medicine

## 2021-07-17 ENCOUNTER — Encounter (INDEPENDENT_AMBULATORY_CARE_PROVIDER_SITE_OTHER): Payer: Self-pay | Admitting: Family Medicine

## 2021-07-17 VITALS — BP 139/65 | HR 72 | Temp 98.0°F | Ht 65.0 in | Wt 221.0 lb

## 2021-07-17 DIAGNOSIS — Z6836 Body mass index (BMI) 36.0-36.9, adult: Secondary | ICD-10-CM | POA: Diagnosis not present

## 2021-07-17 DIAGNOSIS — Z7984 Long term (current) use of oral hypoglycemic drugs: Secondary | ICD-10-CM | POA: Diagnosis not present

## 2021-07-17 DIAGNOSIS — E669 Obesity, unspecified: Secondary | ICD-10-CM | POA: Diagnosis not present

## 2021-07-17 DIAGNOSIS — E8881 Metabolic syndrome: Secondary | ICD-10-CM | POA: Diagnosis not present

## 2021-07-17 MED ORDER — METFORMIN HCL 500 MG PO TABS: 500.0000 mg | ORAL_TABLET | Freq: Every day | ORAL | 0 refills | Status: DC

## 2021-07-18 ENCOUNTER — Other Ambulatory Visit (HOSPITAL_COMMUNITY): Payer: Self-pay | Admitting: Nurse Practitioner

## 2021-07-18 ENCOUNTER — Other Ambulatory Visit (INDEPENDENT_AMBULATORY_CARE_PROVIDER_SITE_OTHER): Payer: Self-pay | Admitting: Family Medicine

## 2021-07-18 DIAGNOSIS — E559 Vitamin D deficiency, unspecified: Secondary | ICD-10-CM

## 2021-07-21 ENCOUNTER — Telehealth: Payer: Self-pay | Admitting: *Deleted

## 2021-07-22 ENCOUNTER — Other Ambulatory Visit: Payer: Self-pay

## 2021-07-22 ENCOUNTER — Encounter: Payer: Self-pay | Admitting: Adult Health

## 2021-07-22 ENCOUNTER — Inpatient Hospital Stay: Payer: Medicare Other | Attending: Adult Health | Admitting: Adult Health

## 2021-07-22 VITALS — BP 133/57 | HR 60 | Temp 98.1°F | Resp 18 | Ht 65.0 in | Wt 224.5 lb

## 2021-07-22 DIAGNOSIS — M25511 Pain in right shoulder: Secondary | ICD-10-CM | POA: Diagnosis not present

## 2021-07-22 DIAGNOSIS — M5136 Other intervertebral disc degeneration, lumbar region: Secondary | ICD-10-CM | POA: Diagnosis not present

## 2021-07-22 DIAGNOSIS — Z923 Personal history of irradiation: Secondary | ICD-10-CM | POA: Insufficient documentation

## 2021-07-22 DIAGNOSIS — Z79810 Long term (current) use of selective estrogen receptor modulators (SERMs): Secondary | ICD-10-CM | POA: Insufficient documentation

## 2021-07-22 DIAGNOSIS — D0512 Intraductal carcinoma in situ of left breast: Secondary | ICD-10-CM | POA: Insufficient documentation

## 2021-07-22 DIAGNOSIS — Z9221 Personal history of antineoplastic chemotherapy: Secondary | ICD-10-CM | POA: Insufficient documentation

## 2021-07-22 DIAGNOSIS — L401 Generalized pustular psoriasis: Secondary | ICD-10-CM | POA: Diagnosis not present

## 2021-07-22 DIAGNOSIS — Z170 Estrogen receptor positive status [ER+]: Secondary | ICD-10-CM | POA: Insufficient documentation

## 2021-07-22 DIAGNOSIS — M1991 Primary osteoarthritis, unspecified site: Secondary | ICD-10-CM | POA: Diagnosis not present

## 2021-07-22 DIAGNOSIS — E669 Obesity, unspecified: Secondary | ICD-10-CM | POA: Diagnosis not present

## 2021-07-22 DIAGNOSIS — G5622 Lesion of ulnar nerve, left upper limb: Secondary | ICD-10-CM | POA: Diagnosis not present

## 2021-07-22 DIAGNOSIS — L4050 Arthropathic psoriasis, unspecified: Secondary | ICD-10-CM | POA: Diagnosis not present

## 2021-07-22 DIAGNOSIS — M5030 Other cervical disc degeneration, unspecified cervical region: Secondary | ICD-10-CM | POA: Diagnosis not present

## 2021-07-22 DIAGNOSIS — Z6835 Body mass index (BMI) 35.0-35.9, adult: Secondary | ICD-10-CM | POA: Diagnosis not present

## 2021-07-22 DIAGNOSIS — M8580 Other specified disorders of bone density and structure, unspecified site: Secondary | ICD-10-CM | POA: Diagnosis not present

## 2021-07-22 DIAGNOSIS — R5383 Other fatigue: Secondary | ICD-10-CM | POA: Diagnosis not present

## 2021-07-22 NOTE — Progress Notes (Signed)
SURVIVORSHIP VISIT:    BRIEF ONCOLOGIC HISTORY:  Oncology History  Ductal carcinoma in situ (DCIS) of left breast  01/06/2021 Initial Diagnosis   Diagnostic mammogram: indeterminate calcifications in the upper outer left breast. Biopsy: DCIS and necrosis and calcifications, ER+(100%)/PR+(70%).    01/15/2021 Cancer Staging   Staging form: Breast, AJCC 8th Edition - Clinical stage from 01/15/2021: Stage 0 (cTis (DCIS), cN0, cM0, ER+, PR+, HER2: Not Assessed) - Signed by [name], MD on 01/15/2021 Stage prefix: Initial diagnosis     Genetic Testing   [facility] CancerNext-Expanded Panel is Negative. Report date is 01/28/2021.  The CancerNext-Expanded gene panel offered by [facility] and includes sequencing, rearrangement, and RNA analysis for the following 77 genes: AIP, ALK, APC, ATM, AXIN2, BAP1, BARD1, BLM, BMPR1A, BRCA1, BRCA2, BRIP1, CDC73, CDH1, CDK4, CDKN1B, CDKN2A, CHEK2, CTNNA1, DICER1, FANCC, FH, FLCN, GALNT12, KIF1B, LZTR1, MAX, MEN1, MET, MLH1, MSH2, MSH3, MSH6, MUTYH, NBN, NF1, NF2, NTHL1, PALB2, PHOX2B, PMS2, POT1, PRKAR1A, PTCH1, PTEN, RAD51C, RAD51D, RB1, RECQL, RET, SDHA, SDHAF2, SDHB, SDHC, SDHD, SMAD4, SMARCA4, SMARCB1, SMARCE1, STK11, SUFU, TMEM127, TP53, TSC1, TSC2, VHL and XRCC2 (sequencing and deletion/duplication); EGFR, EGLN1, HOXB13, KIT, MITF, PDGFRA, POLD1, and POLE (sequencing only); EPCAM and GREM1 (deletion/duplication only).    02/27/2021 Surgery   BREAST, LEFT, LUMPECTOMY:  - Ductal carcinoma in situ, 0.8 cm in maximal dimension.  - Tumor approaches to 0.5 cm of closest (deep) resection margin.    03/28/2021 - 04/23/2021 Radiation Therapy   Site Technique Total Dose (Gy) Dose per Fx (Gy) Completed Fx Beam Energies  Breast, Left: Breast_L 3D 40.05/40.05 2.67 15/15 10XFFF  Breast, Left: Breast_L_Bst 3D 10/10 2 5/5 6X, 10X     04/30/2021 -  Anti-estrogen oral therapy   Tamoxifen started 5 mg daily     INTERVAL HISTORY:  Tonya Bartlett to review her  survivorship care plan detailing her treatment course for breast cancer, as well as monitoring long-term side effects of that treatment, education regarding health maintenance, screening, and overall wellness and health promotion.     Overall, Tonya Bartlett reports feeling moderately well.  She is taking tamoxifen daily and has noted increased fatigue.  She has been told that she "gently snores" at night.  She feels rested upon wakening in the morning.  She is taking tamoxifen and otherwise tolerates it without any difficulty.  REVIEW OF SYSTEMS:  Review of Systems  Constitutional:  Positive for fatigue. Negative for appetite change, chills, fever and unexpected weight change.  HENT:   Negative for hearing loss, lump/mass and trouble swallowing.   Eyes:  Negative for eye problems and icterus.  Respiratory:  Negative for chest tightness, cough and shortness of breath.   Cardiovascular:  Negative for chest pain, leg swelling and palpitations.  Gastrointestinal:  Negative for abdominal distention, abdominal pain, constipation, diarrhea, nausea and vomiting.  Endocrine: Negative for hot flashes.  Genitourinary:  Negative for difficulty urinating.   Musculoskeletal:  Negative for arthralgias.  Skin:  Negative for itching and rash.  Neurological:  Negative for dizziness, extremity weakness, headaches and numbness.  Hematological:  Negative for adenopathy. Does not bruise/bleed easily.  Psychiatric/Behavioral:  Negative for depression. The patient is not nervous/anxious.   Breast: Denies any new nodularity, masses, tenderness, nipple changes, or nipple discharge.      ONCOLOGY TREATMENT TEAM:  1. Surgeon:  Dr. Barry Dienes at [facility] 2. Medical Oncologist: Dr. Lindi Adie  3. Radiation Oncologist: Dr. Sondra Come    PAST MEDICAL/SURGICAL HISTORY:  Past Medical History:  Diagnosis Date  Alcohol abuse    Sober 41 years.   Allergic rhinitis    Allergy    Anemia    Anxiety    Blood  transfusion without reported diagnosis    Breast cancer ([place]) 12/2020   left   Cancer ([place])    Cataract    bil cataracts removed   Clotting disorder ([place])    DVT- 1996 po knee replacement   Colitis    Complication of anesthesia    vomit x1 while on Morphine per pt   Depression    Diverticulosis    Dysrhythmia    Atrial fibrillation   Fatigue    Frequency of urination    GERD (gastroesophageal reflux disease)    History of colon polyps    History of DVT of lower extremity    POST LEFT TOTAL KNEE  1996   History of hiatal hernia    History of iron deficiency anemia 1996   iron infusion   History of migraine headaches    History of radiation therapy    left breast 03/27/2021-04/23/2021 Dr [name]   History of rib fracture    Hyperlipidemia    Hypertension    IBS (irritable bowel syndrome)    Insomnia    Joint pain    MCI (mild cognitive impairment)    Melanoma ([place]) 2019   left leg    Migraine headache    hx of migraines    OA (osteoarthritis)    RIGHT SHOULDER   Osteopenia    Pneumonia    remote history   PONV (postoperative nausea and vomiting)    Recovering alcoholic ([place])    SINCE [id number]   Right rotator cuff tear    SOB (shortness of breath) on exertion    Substance abuse ([place])    recovering alcoholic   Swallowing difficulty    Unspecified essential hypertension    Past Surgical History:  Procedure Laterality Date   BREAST BIOPSY Left 01/06/2021   pos   BREAST LUMPECTOMY WITH RADIOACTIVE SEED LOCALIZATION Left 02/27/2021   Procedure: LEFT BREAST LUMPECTOMY WITH RADIOACTIVE SEED LOCALIZATION;  Surgeon: [name], MD;  Location: [facility];  Service: General;  Laterality: Left;   BUNIONECTOMY/  HAMMERTOE CORRECTION  RIGHT FOOT  2011   CATARACT EXTRACTION W/ INTRAOCULAR LENS  IMPLANT, BILATERAL     COLONOSCOPY     DILATION AND CURETTAGE OF UTERUS  1976   EYE SURGERY Bilateral 2016   cataract removal   KNEE ARTHROSCOPY W/ MENISCECTOMY Bilateral X2   LEFT /    X1  RIGHT   KNEE OPEN LATERAL RELEASE Bilateral    MOHS SURGERY Left 12/15/2017   Melanoma in situ - left calf - [facility]   ORIF ANKLE FRACTURE Right 08/04/2020   Procedure: OPEN REDUCTION INTERNAL FIXATION (ORIF) ANKLE FRACTURE;  Surgeon: Wylene Simmer, MD;  Location: [facility];  Service: Orthopedics;  Laterality: Right;  Mini C-arm, [employer] Small Frag   REPLACEMENT TOTAL KNEE Left 2006   SHOULDER ARTHROSCOPY WITH SUBACROMIAL DECOMPRESSION, ROTATOR CUFF REPAIR AND BICEP TENDON REPAIR Right 05/23/2013   Procedure: RIGHT SHOULDER ARTHROSCOPY EXAM UNDER ANESTHESIA  WITH SUBACROMIAL DECOMPRESSION,DISTAL CLAVICLE RESECTION, SADLABRAL DEBRIDEMENT CHONDROPLASTY, BICEP TENOTOMY ;  Surgeon: Sydnee Cabal, MD;  Location: [facility];  Service: Orthopedics;  Laterality: Right;   TONSILLECTOMY AND ADENOIDECTOMY  AGE 76   TOTAL HIP ARTHROPLASTY Left 05/04/2017   Procedure: LEFT TOTAL HIP ARTHROPLASTY ANTERIOR APPROACH;  Surgeon: Paralee Cancel, MD;  Location: [facility];  Service: Orthopedics;  Laterality: Left;   TOTAL HIP ARTHROPLASTY Right 08/01/2019   Procedure: TOTAL HIP ARTHROPLASTY ANTERIOR APPROACH;  Surgeon: Paralee Cancel, MD;  Location: [facility];  Service: Orthopedics;  Laterality: Right;  13mins   TOTAL KNEE ARTHROPLASTY Bilateral LEFT  1996/   RIGHT 2004   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      UPPER GASTROINTESTINAL ENDOSCOPY     UPPER GI ENDOSCOPY     VAGINAL HYSTERECTOMY  1976     ALLERGIES:  Allergies  Allergen Reactions   Otezla [Apremilast] Anaphylaxis    Suicidal ideation   Penicillins Anaphylaxis    Has patient had a PCN reaction causing immediate rash, facial/tongue/throat swelling, SOB or lightheadedness with hypotension: Yes Has patient had a PCN reaction causing severe rash involving mucus membranes or skin necrosis: Yes Has patient had a PCN reaction that required hospitalization: No Has patient had a PCN reaction occurring within the last 10  year No If all of the above answers are "NO", then may proceed with Cephalosporin use.    Erythromycin Other (See Comments)    SEVERE STOMACH CRAMPS   Morphine And Related Nausea And Vomiting   Nsaids Other (See Comments)    SEVERE STOMACH CRAMPS, MOUTH SORES **Able to tolerate Tylenol   Remicade [Infliximab] Other (See Comments)    Shut down immune system-BP high   Tolmetin     SEVERE STOMACH CRAMPS   Nickel Rash    Including snaps on hospital gowns      CURRENT MEDICATIONS:  Outpatient Encounter Medications as of 07/22/2021  Medication Sig   acetaminophen (TYLENOL) 325 MG tablet Take 650 mg by mouth every 6 (six) hours as needed for moderate pain.   Antiseborrheic Products, Misc. (PROMISEB) CREA Uses topical daily as needed on her face   Ascorbic Acid (VITAMIN C) 1000 MG tablet Take 1,000 mg by mouth daily.   atorvastatin (LIPITOR) 20 MG tablet Take 1 tablet (20 mg total) by mouth daily.   betamethasone dipropionate 0.05 % lotion APPLY TOPICALLY TO AFFECTED AREA EVERY DAY   ELIQUIS 5 MG TABS tablet TAKE 1 TABLET BY MOUTH TWICE A DAY   famotidine (PEPCID) 20 MG tablet Take 1 tablet (20 mg total) by mouth daily as needed for heartburn or indigestion. (Patient taking differently: Take 20 mg by mouth daily.)   FIBER PO Take 1 capsule by mouth daily. Pt takes in the evening.   HYDROcodone-acetaminophen (NORCO/VICODIN) 5-325 MG tablet 1 tablet as needed   hydrocortisone cream 1 % Apply 1 application topically daily as needed for itching.   L-Methylfolate-B12-B6-B2 (CEREFOLIN) 07-17-48-5 MG TABS TAKE 1 TABLET BY MOUTH EVERY DAY   losartan (COZAAR) 100 MG tablet TAKE 1 TABLET BY MOUTH EVERY DAY   metFORMIN (GLUCOPHAGE) 500 MG tablet Take 1 tablet (500 mg total) by mouth daily with breakfast.   metoprolol tartrate (LOPRESSOR) 25 MG tablet TAKE 1/2 TABLET BY MOUTH TWICE A DAY   omeprazole (PRILOSEC) 40 MG capsule Take 1 capsule (40 mg total) by mouth daily.   sulfamethoxazole-trimethoprim  (BACTRIM,SEPTRA) 400-80 MG tablet Take 1 tablet by mouth daily. For urethritis.   tamoxifen (NOLVADEX) 10 MG tablet TAKE 0.5 TABLETS BY MOUTH 2 TIMES DAILY.   traZODone (DESYREL) 50 MG tablet TAKE 1 TABLET BY MOUTH AT BEDTIME   venlafaxine XR (EFFEXOR-XR) 37.5 MG 24 hr capsule TAKE 1 CAPSULE BY MOUTH DAILY WITH BREAKFAST.   Vitamin D, Ergocalciferol, (DRISDOL) 1.25 MG (50000 UNIT) CAPS capsule Take 1 capsule (50,000 Units total) by mouth  every 7 (seven) days.   rizatriptan (MAXALT) 10 MG tablet Take 1 tablet (10 mg total) by mouth as needed for migraine. May repeat in 2 hours if needed (Patient not taking: Reported on 07/22/2021)   No facility-administered encounter medications on file as of 07/22/2021.     ONCOLOGIC FAMILY HISTORY:  Family History  Problem Relation Age of Onset   Cancer Mother    Heart disease Mother    Hypertension Mother    Melanoma Mother 56   Obesity Mother    Heart disease Father 33   Hypertension Father    Esophageal cancer Brother    Dementia Paternal Grandfather    Melanoma Niece 68   Colon cancer Neg Hx    Colon polyps Neg Hx    Stomach cancer Neg Hx    Rectal cancer Neg Hx      GENETIC COUNSELING/TESTING: Negative  SOCIAL HISTORY:  Social History   Socioeconomic History   Marital status: Widowed    Spouse name: Not on file   Number of children: 3   Years of education: Not on file   Highest education level: Master's degree (e.g., MA, MS, MEng, MEd, MSW, MBA)  Occupational History   Occupation: retired Transport planner  Tobacco Use   Smoking status: Former    Packs/day: 0.50    Years: 15.00    Pack years: 7.50    Types: Cigarettes    Quit date: 05/15/1985    Years since quitting: 36.2   Smokeless tobacco: Never  Vaping Use   Vaping Use: Never used  Substance and Sexual Activity   Alcohol use: No    Comment: RECOVERING ALCOHOLIC--  QUIT [id number]   Drug use: No   Sexual activity: Not Currently    Birth control/protection: Post-menopausal   Other Topics Concern   Not on file  Social History Narrative   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    Widowed    Three kids    28 grandchildren       Social Determinants of Radio broadcast assistant Strain: Low Risk    Difficulty of Paying Living Expenses: Not hard at all  Food Insecurity: No Food Insecurity   Worried About Charity fundraiser in the Last Year: Never true   Arboriculturist in the Last Year: Never true  Transportation Needs: No Transportation Needs   Lack of Transportation (Medical): No   Lack of Transportation (Non-Medical): No  Physical Activity: Inactive   Days of Exercise per Week: 0 days   Minutes of Exercise per Session: 0 min  Stress: No Stress Concern Present   Feeling of Stress : Not at all  Social Connections: Moderately Integrated   Frequency of Communication with Friends and Family: More than three times a week   Frequency of Social Gatherings with Friends and Family: More than three times a week   Attends Religious Services: More than 4 times per year   Active Member of [employer] or Organizations: Yes   Attends Archivist Meetings: More than 4 times per year   Marital Status: Widowed  Human resources officer Violence: Not At Risk   Fear of Current or Ex-Partner: No   Emotionally Abused: No   Physically Abused: No   Sexually Abused: No     OBSERVATIONS/OBJECTIVE:  BP (!) 133/57 (BP Location: Left Arm, Patient Position: Sitting)   Pulse 60   Temp 98.1 F (36.7 C)   Resp 18  Ht '5\' 5"'$  (1.651 m)   Wt 224 lb 8 oz (101.8 kg)   SpO2 100%   BMI 37.36 kg/m  GENERAL: Patient is a well appearing female in no acute distress HEENT:  Sclerae anicteric.  Oropharynx clear and moist. No ulcerations or evidence of oropharyngeal candidiasis. Neck is supple.  NODES:  No cervical, supraclavicular, or axillary lymphadenopathy palpated.  BREAST EXAM: Left breast status postlumpectomy and radiation right breast is  benign.  There is no sign of recurrence. LUNGS:  Clear to auscultation bilaterally.  No wheezes or rhonchi. HEART:  Regular rate and rhythm. No murmur appreciated. ABDOMEN:  Soft, nontender.  Positive, normoactive bowel sounds. No organomegaly palpated. MSK:  No focal spinal tenderness to palpation. Full range of motion bilaterally in the upper extremities. EXTREMITIES:  No peripheral edema.   SKIN:  Clear with no obvious rashes or skin changes. No nail dyscrasia. NEURO:  Nonfocal. Well oriented.  Appropriate affect.   LABORATORY DATA:  None for this visit.  DIAGNOSTIC IMAGING:  None for this visit.      ASSESSMENT AND PLAN:  Ms.. Tonya Bartlett is a pleasant 76 y.o. female with Stage 0 left breast DCIS, ER+/PR+, diagnosed in October 2022, treated with lumpectomy, adjuvant radiation therapy, and anti-estrogen therapy with tamoxifen beginning in March 2023.  She presents to the Survivorship Clinic for our initial meeting and routine follow-up post-completion of treatment for breast cancer.    1. Stage 0 left breast cancer:  Ms. Georgia is continuing to recover from definitive treatment for breast cancer. She will follow-up with her medical oncologist, Dr. Lindi Adie in 6 months with history and physical exam per surveillance protocol.  She will continue her anti-estrogen therapy with tamoxifen. Thus far, she is tolerating the tamoxifen well, with minimal side effects. She was instructed to make Dr. Lindi Adie or myself aware if she begins to experience any worsening side effects of the medication and I could see her back in clinic to help manage those side effects, as needed. Her mammogram is due December 2023; orders placed today. Today, a comprehensive survivorship care plan and treatment summary was reviewed with the patient today detailing her breast cancer diagnosis, treatment course, potential late/long-term effects of treatment, appropriate follow-up care with recommendations for the future, and patient  education resources.  A copy of this summary, along with a letter will be sent to the patient's primary care provider via mail/fax/In Basket message after today's visit.    2.  Fatigue: While this could certainly be related to the tamoxifen at a lower dose this could be seen with other more serious issues such as sleep apnea.  Considering Joniyah's BMI and cardiac history I think she would benefit from having a sleep study to further evaluate this and I referred her to pulmonology.  3. Bone health:   She was given education on specific activities to promote bone health.  4. Cancer screening:  Due to Ms. Rohner's history and her age, she should receive screening for skin cancers, colon cancer, and gynecologic cancers.  The information and recommendations are listed on the patient's comprehensive care plan/treatment summary and were reviewed in detail with the patient.    5. Health maintenance and wellness promotion: Ms. Dungee was encouraged to consume 5-7 servings of fruits and vegetables per day. We reviewed the "Nutrition Rainbow" handout.  She was also encouraged to engage in moderate to vigorous exercise for 30 minutes per day most days of the week. We discussed the [employer] fitness program, which  is designed for cancer survivors to help them become more physically fit after cancer treatments.  She was instructed to limit her alcohol consumption and continue to abstain from tobacco use.     6. Support services/counseling: It is not uncommon for this period of the patient's cancer care trajectory to be one of many emotions and stressors.  She was given information regarding our available services and encouraged to contact me with any questions or for help enrolling in any of our support group/programs.    Follow up instructions:    -Return to cancer center 6 months  -Mammogram due in 01/2022 -Bone density 03/2023 -Follow up with surgery 1 year -She is welcome to return back to the  Survivorship Clinic at any time; no additional follow-up needed at this time.  -Consider referral back to survivorship as a long-term survivor for continued surveillance  The patient was provided an opportunity to ask questions and all were answered. The patient agreed with the plan and demonstrated an understanding of the instructions.   Total encounter time:45 minutes*in face-to-face visit time, chart review, lab review, care coordination, order entry, and documentation of the encounter time.    Wilber Bihari, NP 07/22/21 2:17 PM Medical Oncology and Hematology [facility] [place], [place] [address] Tel. [phone]    Fax. [phone]  *Total Encounter Time as defined by the Centers for Medicare and Medicaid Services includes, in addition to the face-to-face time of a patient visit (documented in the note above) non-face-to-face time: obtaining and reviewing outside history, ordering and reviewing medications, tests or procedures, care coordination (communications with other health care professionals or caregivers) and documentation in the medical record.

## 2021-07-23 NOTE — Progress Notes (Signed)
Chief Complaint:   OBESITY Tonya Bartlett is here to discuss her progress with her obesity treatment plan along with follow-up of her obesity related diagnoses. [name] is on the Category 2 Plan and states she is following her eating plan approximately 50% of the time. [name] states she is doing 0 minutes 0 times per week.  Today's visit was #: 3 Starting weight: 222 lbs Starting date: 06/11/2021 Today's weight: 221 lbs Today's date: 07/17/2021 Total lbs lost to date: 1 Total lbs lost since last in-office visit: 0  Interim History: [name] has been traveling and on vacation. She is staying home now and will be able to follow her plan more closely.   Subjective:   1. Insulin resistance [name] has questions about the diagnosis of insulin resistance, and how to explain this to her sister. She has no problems with metformin.   Assessment/Plan:   1. Insulin resistance [name] was educated about the diagnosis and handouts were given today. We will refill metformin for 1 month. We will recheck labs in 2 months. She will continue to work on weight loss, exercise, and decreasing simple carbohydrates to help decrease the risk of diabetes. [name] agreed to follow-up with [name] as directed to closely monitor her progress.  - metFORMIN (GLUCOPHAGE) 500 MG tablet; Take 1 tablet (500 mg total) by mouth daily with breakfast.  Dispense: 30 tablet; Refill: 0  2. Obesity, Current BMI 36.8 [name] is currently in the action stage of change. As such, her goal is to continue with weight loss efforts. She has agreed to the Category 2 Plan.   Exercise goals: All adults should avoid inactivity. Some physical activity is better than none, and adults who participate in any amount of physical activity gain some health benefits.  Behavioral modification strategies: increasing lean protein intake.  [name] has agreed to follow-up with our clinic in 2 weeks. She was informed of the importance of frequent follow-up visits to  maximize her success with intensive lifestyle modifications for her multiple health conditions.   Objective:   Blood pressure 139/65, pulse 72, temperature 98 F (36.7 C), height '5\' 5"'$  (1.651 m), weight 221 lb (100.2 kg), SpO2 98 %. Body mass index is 36.78 kg/m.  General: Cooperative, alert, well developed, in no acute distress. HEENT: Conjunctivae and lids unremarkable. Cardiovascular: Regular rhythm.  Lungs: Normal work of breathing. Neurologic: No focal deficits.   Lab Results  Component Value Date   CREATININE 0.70 06/11/2021   BUN 11 06/11/2021   NA 141 06/11/2021   K 4.4 06/11/2021   CL 104 06/11/2021   CO2 25 06/11/2021   Lab Results  Component Value Date   ALT 17 06/11/2021   AST 20 06/11/2021   ALKPHOS 61 06/11/2021   BILITOT 0.6 06/11/2021   Lab Results  Component Value Date   HGBA1C 5.3 06/11/2021   HGBA1C 5.4 04/24/2021   HGBA1C 5.3 11/05/2016   Lab Results  Component Value Date   INSULIN 9.6 06/11/2021   Lab Results  Component Value Date   TSH 2.880 06/11/2021   Lab Results  Component Value Date   CHOL 166 06/11/2021   HDL 61 06/11/2021   LDLCALC 90 06/11/2021   LDLDIRECT 187.9 10/08/2009   TRIG 82 06/11/2021   CHOLHDL 2.7 06/11/2021   Lab Results  Component Value Date   VD25OH 34.3 06/11/2021   Lab Results  Component Value Date   WBC 3.7 06/11/2021   HGB 12.2 06/11/2021   HCT 36.3 06/11/2021  MCV 98 (H) 06/11/2021   PLT 250 06/11/2021   No results found for: IRON, TIBC, FERRITIN  Obesity Behavioral Intervention:   Approximately 15 minutes were spent on the discussion below.  ASK: We discussed the diagnosis of obesity with [name] today and Aleya agreed to give [name] permission to discuss obesity behavioral modification therapy today.  ASSESS: [name] has the diagnosis of obesity and her BMI today is 36.8. [name] is in the action stage of change.   ADVISE: Allyne was educated on the multiple health risks of obesity as well as  the benefit of weight loss to improve her health. She was advised of the need for long term treatment and the importance of lifestyle modifications to improve her current health and to decrease her risk of future health problems.  AGREE: Multiple dietary modification options and treatment options were discussed and [name] agreed to follow the recommendations documented in the above note.  ARRANGE: [name] was educated on the importance of frequent visits to treat obesity as outlined per CMS and USPSTF guidelines and agreed to schedule her next follow up appointment today.  Attestation Statements:   Reviewed by clinician on day of visit: allergies, medications, problem list, medical history, surgical history, family history, social history, and previous encounter notes.   I, [name], am acting as transcriptionist for [name], MD.  I have reviewed the above documentation for accuracy and completeness, and I agree with the above. -  [name], MD

## 2021-07-23 NOTE — Telephone Encounter (Signed)
Pt calling about refill of Vitamin D that she was told would be sent in for her at her last appt with Br. Leafy Ro. The best pharmacy is [id number] [place], [place] [address] and the best call back number is [phone] Pt is concerned because she will run out of medication during the weekend.   Last appt 07/17/21 with BLeafy Ro

## 2021-07-29 ENCOUNTER — Ambulatory Visit (INDEPENDENT_AMBULATORY_CARE_PROVIDER_SITE_OTHER): Payer: Medicare Other | Admitting: Family Medicine

## 2021-07-29 ENCOUNTER — Encounter (INDEPENDENT_AMBULATORY_CARE_PROVIDER_SITE_OTHER): Payer: Self-pay | Admitting: Family Medicine

## 2021-07-29 VITALS — BP 119/72 | HR 63 | Temp 97.9°F | Ht 65.0 in | Wt 219.0 lb

## 2021-07-29 DIAGNOSIS — Z6836 Body mass index (BMI) 36.0-36.9, adult: Secondary | ICD-10-CM

## 2021-07-29 DIAGNOSIS — E559 Vitamin D deficiency, unspecified: Secondary | ICD-10-CM | POA: Diagnosis not present

## 2021-07-29 DIAGNOSIS — E669 Obesity, unspecified: Secondary | ICD-10-CM

## 2021-07-29 MED ORDER — VITAMIN D (ERGOCALCIFEROL) 1.25 MG (50000 UNIT) PO CAPS: 50000.0000 [IU] | ORAL_CAPSULE | ORAL | 0 refills | Status: DC

## 2021-07-30 NOTE — Progress Notes (Signed)
Chief Complaint:   OBESITY Tonya Bartlett is here to discuss her progress with her obesity treatment plan along with follow-up of her obesity related diagnoses. Tonya Bartlett is on the Category 2 Plan and states she is following her eating plan approximately 50% of the time. Keary states she is active while doing yard work for 120 minutes 3 times per week.  Today's visit was #: 4 Starting weight: 222 lbs Starting date: 06/11/2021 Today's weight: 219 lbs Today's date: 07/29/2021 Total lbs lost to date: 3 Total lbs lost since last in-office visit: 2  Interim History: [place] continues to do well with weight loss.  Her hunger is mostly controlled, and she is working on eating all of the food on her plan.  Subjective:   1. Vitamin D deficiency Tonya Bartlett is on vitamin D, and her last vitamin D level was still low.  No side effects were noted.  Assessment/Plan:   1. Vitamin D deficiency We will refill prescription Vitamin D for 1 month. Ticia will follow-up for routine testing of Vitamin D, at least 2-3 times per year to avoid over-replacement.  - Vitamin D, Ergocalciferol, (DRISDOL) 1.25 MG (50000 UNIT) CAPS capsule; Take 1 capsule (50,000 Units total) by mouth every 7 (seven) days.  Dispense: 4 capsule; Refill: 0  2. Obesity, Current BMI 36.5 Tonya Bartlett is currently in the action stage of change. As such, her goal is to continue with weight loss efforts. She has agreed to the Category 2 Plan.   Exercise goals: As is.  Behavioral modification strategies: increasing lean protein intake.  Judi has agreed to follow-up with our clinic in 2 to 3 weeks. She was informed of the importance of frequent follow-up visits to maximize her success with intensive lifestyle modifications for her multiple health conditions.   Objective:   Blood pressure 119/72, pulse 63, temperature 97.9 F (36.6 C), height '5\' 5"'$  (1.651 m), weight 219 lb (99.3 kg), SpO2 97 %. Body mass index is 36.44 kg/m.  General:  Cooperative, alert, well developed, in no acute distress. HEENT: Conjunctivae and lids unremarkable. Cardiovascular: Regular rhythm.  Lungs: Normal work of breathing. Neurologic: No focal deficits.   Lab Results  Component Value Date   CREATININE 0.70 06/11/2021   BUN 11 06/11/2021   NA 141 06/11/2021   K 4.4 06/11/2021   CL 104 06/11/2021   CO2 25 06/11/2021   Lab Results  Component Value Date   ALT 17 06/11/2021   AST 20 06/11/2021   ALKPHOS 61 06/11/2021   BILITOT 0.6 06/11/2021   Lab Results  Component Value Date   HGBA1C 5.3 06/11/2021   HGBA1C 5.4 04/24/2021   HGBA1C 5.3 11/05/2016   Lab Results  Component Value Date   INSULIN 9.6 06/11/2021   Lab Results  Component Value Date   TSH 2.880 06/11/2021   Lab Results  Component Value Date   CHOL 166 06/11/2021   HDL 61 06/11/2021   LDLCALC 90 06/11/2021   LDLDIRECT 187.9 10/08/2009   TRIG 82 06/11/2021   CHOLHDL 2.7 06/11/2021   Lab Results  Component Value Date   VD25OH 34.3 06/11/2021   Lab Results  Component Value Date   WBC 3.7 06/11/2021   HGB 12.2 06/11/2021   HCT 36.3 06/11/2021   MCV 98 (H) 06/11/2021   PLT 250 06/11/2021   No results found for: "IRON", "TIBC", "FERRITIN"  Attestation Statements:   Reviewed by clinician on day of visit: allergies, medications, problem list, medical history, surgical history, family history,  social history, and previous encounter notes.  Time spent on visit including pre-visit chart review, post-visit care, counseling as documented in the note and charting was 44 minutes.   I, Chrishauna Mee, am acting as transcriptionist for [name], MD.  I have reviewed the above documentation for accuracy and completeness, and I agree with the above. -  [name], MD

## 2021-08-03 ENCOUNTER — Other Ambulatory Visit (HOSPITAL_COMMUNITY): Payer: Self-pay | Admitting: Psychiatry

## 2021-08-06 DIAGNOSIS — H903 Sensorineural hearing loss, bilateral: Secondary | ICD-10-CM | POA: Diagnosis not present

## 2021-08-06 DIAGNOSIS — H838X3 Other specified diseases of inner ear, bilateral: Secondary | ICD-10-CM | POA: Diagnosis not present

## 2021-08-06 DIAGNOSIS — H6981 Other specified disorders of Eustachian tube, right ear: Secondary | ICD-10-CM | POA: Diagnosis not present

## 2021-08-07 ENCOUNTER — Other Ambulatory Visit (HOSPITAL_COMMUNITY): Payer: Self-pay

## 2021-08-07 ENCOUNTER — Encounter: Payer: Self-pay | Admitting: Cardiovascular Disease

## 2021-08-07 ENCOUNTER — Ambulatory Visit (INDEPENDENT_AMBULATORY_CARE_PROVIDER_SITE_OTHER): Payer: Medicare Other | Admitting: Cardiovascular Disease

## 2021-08-07 VITALS — BP 136/74 | HR 61 | Ht 66.0 in | Wt 227.0 lb

## 2021-08-07 DIAGNOSIS — I480 Paroxysmal atrial fibrillation: Secondary | ICD-10-CM | POA: Diagnosis not present

## 2021-08-07 DIAGNOSIS — E782 Mixed hyperlipidemia: Secondary | ICD-10-CM | POA: Diagnosis not present

## 2021-08-07 DIAGNOSIS — Z79899 Other long term (current) drug therapy: Secondary | ICD-10-CM

## 2021-08-07 DIAGNOSIS — R931 Abnormal findings on diagnostic imaging of heart and coronary circulation: Secondary | ICD-10-CM

## 2021-08-07 DIAGNOSIS — I10 Essential (primary) hypertension: Secondary | ICD-10-CM | POA: Diagnosis not present

## 2021-08-07 DIAGNOSIS — I2510 Atherosclerotic heart disease of native coronary artery without angina pectoris: Secondary | ICD-10-CM | POA: Insufficient documentation

## 2021-08-07 MED ORDER — ATORVASTATIN CALCIUM 40 MG PO TABS
40.0000 mg | ORAL_TABLET | Freq: Every day | ORAL | 3 refills | Status: DC
Start: 2021-08-07 — End: 2022-09-16

## 2021-08-07 MED ORDER — VENLAFAXINE HCL ER 37.5 MG PO CP24: 37.5000 mg | ORAL_CAPSULE | Freq: Every day | ORAL | 0 refills | Status: DC

## 2021-08-07 NOTE — Assessment & Plan Note (Signed)
History of essential hypertension a blood pressure measured today at 136/74.  She is on losartan and metoprolol.

## 2021-08-07 NOTE — Assessment & Plan Note (Signed)
History of hyperlipidemia on atorvastatin 20 mg a day with lipid profile performed 06/11/2021 revealing total cholesterol 166, LDL of 90 and HDL 61.  Given her mildly elevated coronary calcium score I am going to increase her atorvastatin from 20 to 40 mg a day and we will recheck a lipid liver profile and 3 months.  LDL goal less than 70.

## 2021-08-07 NOTE — Assessment & Plan Note (Signed)
History of PAF controlled on beta-blockade and Eliquis.

## 2021-08-07 NOTE — Assessment & Plan Note (Signed)
Coronary calcium score was 24.  Based on this I began her on atorvastatin however LDL still is above goal.  We will increase her dosage from 20 to 40 mg a day.

## 2021-08-07 NOTE — Patient Instructions (Addendum)
Medication Instructions:  Your physician has recommended you make the following change in your medication:  INCREASE:  Atorvastatin 40 mg once nightly *If you need a refill on your cardiac medications before your next appointment, please call your pharmacy*   Lab Work: Your physician recommends that you return for lab work in:  IN 3 MONTHS: Lipids, LFTs If you have labs (blood work) drawn today and your tests are completely normal, you will receive your results only by: [place] (if you have MyChart) OR A paper copy in the mail If you have any lab test that is abnormal or we need to change your treatment, we will call you to review the results.   Testing/Procedures: None   Follow-Up: At [facility], you and your health needs are our priority.  As part of our continuing mission to provide you with exceptional heart care, we have created designated Provider Care Teams.  These Care Teams include your primary Cardiologist (physician) and Advanced Practice Providers (APPs -  Physician Assistants and Nurse Practitioners) who all work together to provide you with the care you need, when you need it.  We recommend signing up for the patient portal called "MyChart".  Sign up information is provided on this After Visit Summary.  MyChart is used to connect with patients for Virtual Visits (Telemedicine).  Patients are able to view lab/test results, encounter notes, upcoming appointments, etc.  Non-urgent messages can be sent to your provider as well.   To learn more about what you can do with MyChart, go to [email or web address].    Your next appointment:   1 year(s)  The format for your next appointment:   In Person  Provider:   Quay Burow, MD     Other Instructions   Important Information About Sugar

## 2021-08-07 NOTE — Progress Notes (Signed)
08/07/2021 Tonya Bartlett   24-Sep-1945  [id number]  Primary Physician Carlisle Cater, Tommi Rumps, NP Primary Cardiologist: [name] MD [name], [place]  HPI:  Tonya Bartlett is a 76 y.o.  moderately overweight widowed Caucasian female mother of 3 children, grandmother of 6 grandchildren who is still a Programme researcher, broadcasting/film/video.  She was referred by Dr. Havery Moros, her gastroenterologist, for evaluation of recently recognized PAF.  I last saw her in the office 12/25/2020.  Her cardiac risk factors are positive for treated hypertension and family history.  Father died of a myocardial infarction at age 66.  She is never had a heart attack or stroke.  She denies chest pain or shortness of breath.  She has had multiple joint replacements including 3 total knee replacements, 2 total hip replacements and 2 shoulder surgeries.  She recently broke her ankle and had orthopedic surgery on this 10 weeks ago.  She is noticed palpitations over the last 6 to 12 months occurring for minutes at a time, usually when she is quiet and resting.  Since I saw her 8 months ago she did see Roderic Palau in the office in the A-fib clinic 02/21/2021.  Her episodes of A-fib and palpitations are markedly improved on beta-blockade.  She was diagnosed with breast cancer by routine mammography and underwent lumpectomy.  She was stage 0 and had 20 days of radiation therapy.  She is also had some orthopedic issues which she says is limiting her activity.  She denies chest pain or shortness of breath.  She is scheduled to have a sleep study in the near future.  Current Meds  Medication Sig   acetaminophen (TYLENOL) 325 MG tablet Take 650 mg by mouth every 6 (six) hours as needed for moderate pain.   Antiseborrheic Products, Misc. (PROMISEB) CREA Uses topical daily as needed on her face   Ascorbic Acid (VITAMIN C) 1000 MG tablet Take 1,000 mg by mouth daily.   betamethasone dipropionate 0.05 % lotion APPLY TOPICALLY TO  AFFECTED AREA EVERY DAY   ELIQUIS 5 MG TABS tablet TAKE 1 TABLET BY MOUTH TWICE A DAY   famotidine (PEPCID) 20 MG tablet Take 1 tablet (20 mg total) by mouth daily as needed for heartburn or indigestion. (Patient taking differently: Take 20 mg by mouth daily.)   FIBER PO Take 1 capsule by mouth daily. Pt takes in the evening.   HYDROcodone-acetaminophen (NORCO/VICODIN) 5-325 MG tablet 1 tablet as needed   hydrocortisone cream 1 % Apply 1 application topically daily as needed for itching.   L-Methylfolate-B12-B6-B2 (CEREFOLIN) 07-17-48-5 MG TABS TAKE 1 TABLET BY MOUTH EVERY DAY   losartan (COZAAR) 100 MG tablet TAKE 1 TABLET BY MOUTH EVERY DAY   metFORMIN (GLUCOPHAGE) 500 MG tablet Take 1 tablet (500 mg total) by mouth daily with breakfast.   metoprolol tartrate (LOPRESSOR) 25 MG tablet TAKE 1/2 TABLET BY MOUTH TWICE A DAY   omeprazole (PRILOSEC) 40 MG capsule Take 1 capsule (40 mg total) by mouth daily.   rizatriptan (MAXALT) 10 MG tablet Take 1 tablet (10 mg total) by mouth as needed for migraine. May repeat in 2 hours if needed   sulfamethoxazole-trimethoprim (BACTRIM,SEPTRA) 400-80 MG tablet Take 1 tablet by mouth daily. For urethritis.   tamoxifen (NOLVADEX) 10 MG tablet TAKE 0.5 TABLETS BY MOUTH 2 TIMES DAILY.   traZODone (DESYREL) 50 MG tablet TAKE 1 TABLET BY MOUTH AT BEDTIME   venlafaxine XR (EFFEXOR-XR) 37.5 MG 24 hr capsule Take 1 capsule (37.5  mg total) by mouth daily with breakfast.   Vitamin D, Ergocalciferol, (DRISDOL) 1.25 MG (50000 UNIT) CAPS capsule Take 1 capsule (50,000 Units total) by mouth every 7 (seven) days.   [DISCONTINUED] atorvastatin (LIPITOR) 20 MG tablet Take 1 tablet (20 mg total) by mouth daily.     Allergies  Allergen Reactions   Otezla [Apremilast] Anaphylaxis    Suicidal ideation   Penicillins Anaphylaxis    Has patient had a PCN reaction causing immediate rash, facial/tongue/throat swelling, SOB or lightheadedness with hypotension: Yes Has patient had a  PCN reaction causing severe rash involving mucus membranes or skin necrosis: Yes Has patient had a PCN reaction that required hospitalization: No Has patient had a PCN reaction occurring within the last 10 year No If all of the above answers are "NO", then may proceed with Cephalosporin use.    Erythromycin Other (See Comments)    SEVERE STOMACH CRAMPS   Morphine And Related Nausea And Vomiting   Nsaids Other (See Comments)    SEVERE STOMACH CRAMPS, MOUTH SORES **Able to tolerate Tylenol   Remicade [Infliximab] Other (See Comments)    Shut down immune system-BP high   Tolmetin     SEVERE STOMACH CRAMPS   Nickel Rash    Including snaps on hospital gowns     Social History   Socioeconomic History   Marital status: Widowed    Spouse name: Not on file   Number of children: 3   Years of education: Not on file   Highest education level: Master's degree (e.g., MA, MS, MEng, MEd, MSW, MBA)  Occupational History   Occupation: retired Transport planner  Tobacco Use   Smoking status: Former    Packs/day: 0.50    Years: 15.00    Total pack years: 7.50    Types: Cigarettes    Quit date: 05/15/1985    Years since quitting: 36.2   Smokeless tobacco: Never  Vaping Use   Vaping Use: Never used  Substance and Sexual Activity   Alcohol use: No    Comment: RECOVERING ALCOHOLIC--  QUIT [id number]   Drug use: No   Sexual activity: Not Currently    Birth control/protection: Post-menopausal  Other Topics Concern   Not on file  Social History Narrative   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    Widowed    Three kids    28 grandchildren       Social Determinants of Health   Financial Resource Strain: Low Risk  (04/18/2021)   Overall Financial Resource Strain (CARDIA)    Difficulty of Paying Living Expenses: Not hard at all  Food Insecurity: No Food Insecurity (04/18/2021)   Hunger Vital Sign    Worried About Running Out of Food in the Last Year: Never  true    [place] in the Last Year: Never true  Transportation Needs: No Transportation Needs (04/18/2021)   PRAPARE - Hydrologist (Medical): No    Lack of Transportation (Non-Medical): No  Physical Activity: Inactive (04/18/2021)   Exercise Vital Sign    Days of Exercise per Week: 0 days    Minutes of Exercise per Session: 0 min  Stress: No Stress Concern Present (04/18/2021)   [place]    Feeling of Stress : Not at all  Social Connections: Moderately Integrated (04/18/2021)   Social Connection and Isolation Panel [NHANES]    Frequency of Communication with Friends  and Family: More than three times a week    Frequency of Social Gatherings with Friends and Family: More than three times a week    Attends Religious Services: More than 4 times per year    Active Member of Clubs or Organizations: Yes    Attends Archivist Meetings: More than 4 times per year    Marital Status: Widowed  Intimate Partner Violence: Not At Risk (04/18/2021)   Humiliation, Afraid, Rape, and Kick questionnaire    Fear of Current or Ex-Partner: No    Emotionally Abused: No    Physically Abused: No    Sexually Abused: No     Review of Systems: General: negative for chills, fever, night sweats or weight changes.  Cardiovascular: negative for chest pain, dyspnea on exertion, edema, orthopnea, palpitations, paroxysmal nocturnal dyspnea or shortness of breath Dermatological: negative for rash Respiratory: negative for cough or wheezing Urologic: negative for hematuria Abdominal: negative for nausea, vomiting, diarrhea, bright red blood per rectum, melena, or hematemesis Neurologic: negative for visual changes, syncope, or dizziness All other systems reviewed and are otherwise negative except as noted above.    Blood pressure 136/74, pulse 61, height '5\' 6"'$  (1.676 m), weight 227 lb (103 kg).  General  appearance: alert and no distress Neck: no adenopathy, no carotid bruit, no JVD, supple, symmetrical, trachea midline, and thyroid not enlarged, symmetric, no tenderness/mass/nodules Lungs: clear to auscultation bilaterally Heart: regular rate and rhythm, S1, S2 normal, no murmur, click, rub or gallop Extremities: extremities normal, atraumatic, no cyanosis or edema Pulses: 2+ and symmetric Skin: Skin color, texture, turgor normal. No rashes or lesions Neurologic: Grossly normal  EKG not performed today  ASSESSMENT AND PLAN:   Hyperlipidemia History of hyperlipidemia on atorvastatin 20 mg a day with lipid profile performed 06/11/2021 revealing total cholesterol 166, LDL of 90 and HDL 61.  Given her mildly elevated coronary calcium score I am going to increase her atorvastatin from 20 to 40 mg a day and we will recheck a lipid liver profile and 3 months.  LDL goal less than 70.  Essential hypertension, benign History of essential hypertension a blood pressure measured today at 136/74.  She is on losartan and metoprolol.  PAF (paroxysmal atrial fibrillation) (HCC) History of PAF controlled on beta-blockade and Eliquis.  Elevated coronary artery calcium score Coronary calcium score was 24.  Based on this I began her on atorvastatin however LDL still is above goal.  We will increase her dosage from 20 to 40 mg a day.     [name] MD FACP,FACC,FAHA, [facility] 08/07/2021 4:08 PM

## 2021-08-08 ENCOUNTER — Other Ambulatory Visit (INDEPENDENT_AMBULATORY_CARE_PROVIDER_SITE_OTHER): Payer: Self-pay | Admitting: Family Medicine

## 2021-08-08 DIAGNOSIS — E8881 Metabolic syndrome: Secondary | ICD-10-CM

## 2021-08-12 ENCOUNTER — Ambulatory Visit (INDEPENDENT_AMBULATORY_CARE_PROVIDER_SITE_OTHER): Payer: Medicare Other | Admitting: Family Medicine

## 2021-08-22 ENCOUNTER — Encounter: Payer: Self-pay | Admitting: Gastroenterology

## 2021-08-23 NOTE — Progress Notes (Deleted)
08/26/21- 34 yoF former smoker for sleep evaluation courtesy of Wilber Bihari, NP with concern of fatigue Medical problem list includes Aortic Atherosclerosis, CAD, HTN, Migraine, PAFib, Allergic Rhinitis, GERD, Osteoarthritis, ADD, Depression, Hx L Breast Ca/ XRT, Obesity,  Epworth score- Body weight today-

## 2021-08-26 ENCOUNTER — Institutional Professional Consult (permissible substitution): Payer: Medicare Other | Admitting: Internal Medicine

## 2021-08-27 ENCOUNTER — Encounter (INDEPENDENT_AMBULATORY_CARE_PROVIDER_SITE_OTHER): Payer: Self-pay | Admitting: Family Medicine

## 2021-08-27 ENCOUNTER — Encounter (HOSPITAL_COMMUNITY): Payer: Self-pay | Admitting: Psychiatry

## 2021-08-27 ENCOUNTER — Ambulatory Visit (INDEPENDENT_AMBULATORY_CARE_PROVIDER_SITE_OTHER): Payer: Medicare Other | Admitting: Family Medicine

## 2021-08-27 ENCOUNTER — Telehealth (INDEPENDENT_AMBULATORY_CARE_PROVIDER_SITE_OTHER): Payer: Medicare Other | Admitting: Psychiatry

## 2021-08-27 VITALS — BP 151/84 | HR 53 | Temp 97.6°F | Ht 66.0 in | Wt 220.0 lb

## 2021-08-27 DIAGNOSIS — R7303 Prediabetes: Secondary | ICD-10-CM | POA: Diagnosis not present

## 2021-08-27 DIAGNOSIS — F5102 Adjustment insomnia: Secondary | ICD-10-CM

## 2021-08-27 DIAGNOSIS — E559 Vitamin D deficiency, unspecified: Secondary | ICD-10-CM | POA: Diagnosis not present

## 2021-08-27 DIAGNOSIS — R931 Abnormal findings on diagnostic imaging of heart and coronary circulation: Secondary | ICD-10-CM

## 2021-08-27 DIAGNOSIS — E669 Obesity, unspecified: Secondary | ICD-10-CM

## 2021-08-27 DIAGNOSIS — Z6835 Body mass index (BMI) 35.0-35.9, adult: Secondary | ICD-10-CM

## 2021-08-27 DIAGNOSIS — F331 Major depressive disorder, recurrent, moderate: Secondary | ICD-10-CM | POA: Diagnosis not present

## 2021-08-27 MED ORDER — VENLAFAXINE HCL ER 37.5 MG PO CP24
37.5000 mg | ORAL_CAPSULE | Freq: Every day | ORAL | 0 refills | Status: DC
Start: 2021-08-27 — End: 2021-11-19

## 2021-08-27 MED ORDER — METFORMIN HCL 500 MG PO TABS: 500.0000 mg | ORAL_TABLET | Freq: Two times a day (BID) | ORAL | 0 refills | Status: DC

## 2021-08-27 MED ORDER — VITAMIN D (ERGOCALCIFEROL) 1.25 MG (50000 UNIT) PO CAPS: 50000.0000 [IU] | ORAL_CAPSULE | ORAL | 0 refills | Status: DC

## 2021-08-27 NOTE — Progress Notes (Signed)
[facility] Outpatient visit Tele psych  Patient Identification: Tonya Bartlett MRN:  [id number] Date of Evaluation:  08/27/2021 Referral Source: NP Chief Complaint:   Depression follow up  Visit Diagnosis:    ICD-10-CM   1. Major depressive disorder, recurrent episode, moderate (HCC)  F33.1     2. Adjustment insomnia  F51.02      Virtual Visit via Video Note  I connected with Tonya Bartlett on 08/27/21 at 10:30 AM EDT by a video enabled telemedicine application and verified that I am speaking with the correct person using two identifiers.  Location: Patient: home Provider: home office   I discussed the limitations of evaluation and management by telemedicine and the availability of in person appointments. The patient expressed understanding and agreed to proceed.     I discussed the assessment and treatment plan with the patient. The patient was provided an opportunity to ask questions and all were answered. The patient agreed with the plan and demonstrated an understanding of the instructions.   The patient was advised to call back or seek an in-person evaluation if the symptoms worsen or if the condition fails to improve as anticipated.  I provided 11 minutes of non-face-to-face time during this encounter.   History of Present Illness: 76 years old currently widowed white female lives by herself has 3 grown kids and 6 grandkids referred initially by family medicine for management of depression   Remains sober 40 years plus and active in [place] Has had breast surgery daignosed with cancer, recovering, on radiation.   Some anxiety related to being on blood thinner and its cost, overal keeps busy with AA helpls May consider to increase effexor if needed    Modifying factors : family, daughter is now communicating aggravating factors. Hip surgery, breast surgery, medical conditions Severity managing depressionfair  Past Psychiatric History: depression  Previous Psychotropic  Medications: Yes   Substance Abuse History in the last 12 months:  No.  Consequences of Substance Abuse: NA  Past Medical History:  Past Medical History:  Diagnosis Date   Alcohol abuse    Sober 41 years.   Allergic rhinitis    Allergy    Anemia    Anxiety    Blood transfusion without reported diagnosis    Breast cancer ([place]) 12/2020   left   Cancer ([place])    Cataract    bil cataracts removed   Clotting disorder ([place])    DVT- 1996 po knee replacement   Colitis    Complication of anesthesia    vomit x1 while on Morphine per pt   Depression    Diverticulosis    Dysrhythmia    Atrial fibrillation   Fatigue    Frequency of urination    GERD (gastroesophageal reflux disease)    History of colon polyps    History of DVT of lower extremity    POST LEFT TOTAL KNEE  1996   History of hiatal hernia    History of iron deficiency anemia 1996   iron infusion   History of migraine headaches    History of radiation therapy    left breast 03/27/2021-04/23/2021 Dr [name]   History of rib fracture    Hyperlipidemia    Hypertension    IBS (irritable bowel syndrome)    Insomnia    Joint pain    MCI (mild cognitive impairment)    Melanoma ([place]) 2019   left leg    Migraine headache    hx of migraines  OA (osteoarthritis)    RIGHT SHOULDER   Osteopenia    Pneumonia    remote history   PONV (postoperative nausea and vomiting)    Recovering alcoholic ([place])    SINCE [id number]   Right rotator cuff tear    SOB (shortness of breath) on exertion    Substance abuse ([place])    recovering alcoholic   Swallowing difficulty    Unspecified essential hypertension     Past Surgical History:  Procedure Laterality Date   BREAST BIOPSY Left 01/06/2021   pos   BREAST LUMPECTOMY WITH RADIOACTIVE SEED LOCALIZATION Left 02/27/2021   Procedure: LEFT BREAST LUMPECTOMY WITH RADIOACTIVE SEED LOCALIZATION;  Surgeon: [name], MD;  Location: [place];  Service: General;  Laterality: Left;    BUNIONECTOMY/  HAMMERTOE CORRECTION  RIGHT FOOT  2011   CATARACT EXTRACTION W/ INTRAOCULAR LENS  IMPLANT, BILATERAL     COLONOSCOPY     DILATION AND CURETTAGE OF UTERUS  1976   EYE SURGERY Bilateral 2016   cataract removal   KNEE ARTHROSCOPY W/ MENISCECTOMY Bilateral X2  LEFT /    X1  RIGHT   KNEE OPEN LATERAL RELEASE Bilateral    MOHS SURGERY Left 12/15/2017   Melanoma in situ - left calf - [facility]   ORIF ANKLE FRACTURE Right 08/04/2020   Procedure: OPEN REDUCTION INTERNAL FIXATION (ORIF) ANKLE FRACTURE;  Surgeon: Wylene Simmer, MD;  Location: [facility];  Service: Orthopedics;  Laterality: Right;  Mini C-arm, [employer] Small Frag   REPLACEMENT TOTAL KNEE Left 2006   SHOULDER ARTHROSCOPY WITH SUBACROMIAL DECOMPRESSION, ROTATOR CUFF REPAIR AND BICEP TENDON REPAIR Right 05/23/2013   Procedure: RIGHT SHOULDER ARTHROSCOPY EXAM UNDER ANESTHESIA  WITH SUBACROMIAL DECOMPRESSION,DISTAL CLAVICLE RESECTION, SADLABRAL DEBRIDEMENT CHONDROPLASTY, BICEP TENOTOMY ;  Surgeon: Sydnee Cabal, MD;  Location: [place];  Service: Orthopedics;  Laterality: Right;   TONSILLECTOMY AND ADENOIDECTOMY  AGE 73   TOTAL HIP ARTHROPLASTY Left 05/04/2017   Procedure: LEFT TOTAL HIP ARTHROPLASTY ANTERIOR APPROACH;  Surgeon: Paralee Cancel, MD;  Location: [facility];  Service: Orthopedics;  Laterality: Left;   TOTAL HIP ARTHROPLASTY Right 08/01/2019   Procedure: TOTAL HIP ARTHROPLASTY ANTERIOR APPROACH;  Surgeon: Paralee Cancel, MD;  Location: [facility];  Service: Orthopedics;  Laterality: Right;  53mns   TOTAL KNEE ARTHROPLASTY Bilateral LEFT  1996/   RIGHT 2004   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      UPPER GASTROINTESTINAL ENDOSCOPY     UPPER GI ENDOSCOPY     VAGINAL HYSTERECTOMY  1976    Family Psychiatric History: mom possible depression  Family History:  Family History  Problem Relation Age of Onset   Cancer Mother    Heart disease Mother    Hypertension Mother    Melanoma Mother 84   Obesity Mother    Heart disease Father 622  Hypertension Father    Esophageal cancer Brother    Dementia Paternal Grandfather    Melanoma Niece 146  Colon cancer Neg Hx    Colon polyps Neg Hx    Stomach cancer Neg Hx    Rectal cancer Neg Hx     Social History:   Social History   Socioeconomic History   Marital status: Widowed    Spouse name: Not on file   Number of children: 3   Years of education: Not on file   Highest education level: Master's degree (e.g., MA, MS, MEng, MEd, MSW, MBA)  Occupational History   Occupation: retired tTransport planner  Tobacco Use   Smoking status: Former    Packs/day: 0.50    Years: 15.00    Total pack years: 7.50    Types: Cigarettes    Quit date: 05/15/1985    Years since quitting: 36.3   Smokeless tobacco: Never  Vaping Use   Vaping Use: Never used  Substance and Sexual Activity   Alcohol use: No    Comment: RECOVERING ALCOHOLIC--  QUIT [id number]   Drug use: No   Sexual activity: Not Currently    Birth control/protection: Post-menopausal  Other Topics Concern   Not on file  Social History Narrative   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    Widowed    Three kids    65 grandchildren       Social Determinants of Health   Financial Resource Strain: Low Risk  (04/18/2021)   Overall Financial Resource Strain (CARDIA)    Difficulty of Paying Living Expenses: Not hard at all  Food Insecurity: No Food Insecurity (04/18/2021)   Hunger Vital Sign    Worried About Running Out of Food in the Last Year: Never true    [place] in the Last Year: Never true  Transportation Needs: No Transportation Needs (04/18/2021)   PRAPARE - Hydrologist (Medical): No    Lack of Transportation (Non-Medical): No  Physical Activity: Inactive (04/18/2021)   Exercise Vital Sign    Days of Exercise per Week: 0 days    Minutes of Exercise per Session: 0 min  Stress: No Stress Concern Present  (04/18/2021)   [place]    Feeling of Stress : Not at all  Social Connections: Moderately Integrated (04/18/2021)   Social Connection and Isolation Panel [NHANES]    Frequency of Communication with Friends and Family: More than three times a week    Frequency of Social Gatherings with Friends and Family: More than three times a week    Attends Religious Services: More than 4 times per year    Active Member of [employer] or Organizations: Yes    Attends Archivist Meetings: More than 4 times per year    Marital Status: Widowed     Allergies:   Allergies  Allergen Reactions   Otezla [Apremilast] Anaphylaxis    Suicidal ideation   Penicillins Anaphylaxis    Has patient had a PCN reaction causing immediate rash, facial/tongue/throat swelling, SOB or lightheadedness with hypotension: Yes Has patient had a PCN reaction causing severe rash involving mucus membranes or skin necrosis: Yes Has patient had a PCN reaction that required hospitalization: No Has patient had a PCN reaction occurring within the last 10 year No If all of the above answers are "NO", then may proceed with Cephalosporin use.    Erythromycin Other (See Comments)    SEVERE STOMACH CRAMPS   Morphine And Related Nausea And Vomiting   Nsaids Other (See Comments)    SEVERE STOMACH CRAMPS, MOUTH SORES **Able to tolerate Tylenol   Remicade [Infliximab] Other (See Comments)    Shut down immune system-BP high   Tolmetin     SEVERE STOMACH CRAMPS   Nickel Rash    Including snaps on hospital gowns     Metabolic Disorder Labs: Lab Results  Component Value Date   HGBA1C 5.3 06/11/2021   No results found for: "PROLACTIN" Lab Results  Component Value Date   CHOL 166 06/11/2021   TRIG  82 06/11/2021   HDL 61 06/11/2021   CHOLHDL 2.7 06/11/2021   VLDL 13.8 04/24/2021   LDLCALC 90 06/11/2021   LDLCALC 97 04/24/2021     Current  Medications: Current Outpatient Medications  Medication Sig Dispense Refill   acetaminophen (TYLENOL) 325 MG tablet Take 650 mg by mouth every 6 (six) hours as needed for moderate pain.     Antiseborrheic Products, Misc. (PROMISEB) CREA Uses topical daily as needed on her face     Ascorbic Acid (VITAMIN C) 1000 MG tablet Take 1,000 mg by mouth daily.     atorvastatin (LIPITOR) 40 MG tablet Take 1 tablet (40 mg total) by mouth daily. 90 tablet 3   betamethasone dipropionate 0.05 % lotion APPLY TOPICALLY TO AFFECTED AREA EVERY DAY 60 mL 1   ELIQUIS 5 MG TABS tablet TAKE 1 TABLET BY MOUTH TWICE A DAY 180 tablet 1   famotidine (PEPCID) 20 MG tablet Take 1 tablet (20 mg total) by mouth daily as needed for heartburn or indigestion. (Patient taking differently: Take 20 mg by mouth daily.) 90 tablet 3   FIBER PO Take 1 capsule by mouth daily. Pt takes in the evening.     HYDROcodone-acetaminophen (NORCO/VICODIN) 5-325 MG tablet 1 tablet as needed     hydrocortisone cream 1 % Apply 1 application topically daily as needed for itching.     L-Methylfolate-B12-B6-B2 (CEREFOLIN) 07-17-48-5 MG TABS TAKE 1 TABLET BY MOUTH EVERY DAY 90 tablet 0   losartan (COZAAR) 100 MG tablet TAKE 1 TABLET BY MOUTH EVERY DAY 90 tablet 0   metFORMIN (GLUCOPHAGE) 500 MG tablet Take 1 tablet (500 mg total) by mouth daily with breakfast. 30 tablet 0   metoprolol tartrate (LOPRESSOR) 25 MG tablet TAKE 1/2 TABLET BY MOUTH TWICE A DAY 90 tablet 1   omeprazole (PRILOSEC) 40 MG capsule Take 1 capsule (40 mg total) by mouth daily. 30 capsule 5   rizatriptan (MAXALT) 10 MG tablet Take 1 tablet (10 mg total) by mouth as needed for migraine. May repeat in 2 hours if needed 10 tablet 2   sulfamethoxazole-trimethoprim (BACTRIM,SEPTRA) 400-80 MG tablet Take 1 tablet by mouth daily. For urethritis.     tamoxifen (NOLVADEX) 10 MG tablet TAKE 0.5 TABLETS BY MOUTH 2 TIMES DAILY. 90 tablet 3   traZODone (DESYREL) 50 MG tablet TAKE 1 TABLET BY MOUTH  AT BEDTIME 90 tablet 1   venlafaxine XR (EFFEXOR-XR) 37.5 MG 24 hr capsule Take 1 capsule (37.5 mg total) by mouth daily with breakfast. 90 capsule 0   Vitamin D, Ergocalciferol, (DRISDOL) 1.25 MG (50000 UNIT) CAPS capsule Take 1 capsule (50,000 Units total) by mouth every 7 (seven) days. 4 capsule 0   No current facility-administered medications for this visit.     Psychiatric Specialty Exam: Review of Systems  Cardiovascular:  Negative for chest pain.  Psychiatric/Behavioral:  Negative for depression and suicidal ideas.     There were no vitals taken for this visit.There is no height or weight on file to calculate BMI.  General Appearance: casual  Eye Contact:  fair  Speech:  Normal Rate  Volume:  Decreased  Mood: fair  Affect: congruent  Thought Process:  Goal Directed  Orientation:  Full (Time, Place, and Person)  Thought Content:  Rumination  Suicidal Thoughts:  No  Homicidal Thoughts:  No  Memory:  Immediate;   Fair Recent;   Fair  Judgement:  Fair  Insight:  Fair  Psychomotor Activity:  Normal  Concentration:  Concentration: Fair and  Attention Span: Fair  Recall:  [employer] of Knowledge:Good  Language: Good  Akathisia:  No  Handed:  Right  AIMS (if indicated):    Assets:  Desire for Improvement  ADL's:  Intact  Cognition: Impaired,  Mild  Sleep:  Variable to fair on meds    Treatment Plan Summary: Medication management and Plan as follows    Prior documentation reviewed  1. MDD, mild to moderate: doing fair continue effexor If need to increase she will call overall feels to continue same  2. Insomnia: managing It f fair continue trazadone  Alcohol dependence:sustained remission and continue AA has support  Fu 68m    NMerian Capron MD 7/12/202310:50 AM

## 2021-08-28 ENCOUNTER — Other Ambulatory Visit: Payer: Self-pay | Admitting: Neurology

## 2021-09-01 NOTE — Progress Notes (Signed)
Chief Complaint:   OBESITY Tonya Bartlett is here to discuss her progress with her obesity treatment plan along with follow-up of her obesity related diagnoses. Tonya Bartlett is on the Category 2 Plan and states she is following her eating plan approximately 50% of the time. Tonya Bartlett states she is swimming for 30 minutes 3 times per week.  Today's visit was #: 5 Starting weight: 222 lbs Starting date: 06/11/2021 Today's weight: 220 lbs Today's date: 08/27/2021 Total lbs lost to date: 2 Total lbs lost since last in-office visit: 0  Interim History: Tonya Bartlett did some social eating and she has been eating out since her last visit. She tried hard to make better choices which is likely why she didn't gain more weight. She has a few more celebrations this month and he is open to making damage control strategies.   Subjective:   1. Vitamin D deficiency Tonya Bartlett is stable on Vitamin D, and her level is not yet at goal.   2. Pre-diabetes Tonya Bartlett is doing well on metformin and she denies side effects. She still notes some PM polyphagia.   Assessment/Plan:   1. Vitamin D deficiency We will refill prescription Vitamin D 50,000 IU every week for 1 month. Tonya Bartlett will follow-up for routine testing of Vitamin D, at least 2-3 times per year to avoid over-replacement.  - Vitamin D, Ergocalciferol, (DRISDOL) 1.25 MG (50000 UNIT) CAPS capsule; Take 1 capsule (50,000 Units total) by mouth every 7 (seven) days.  Dispense: 4 capsule; Refill: 0  2. Pre-diabetes Tonya Bartlett agreed to increase metformin to 500 mg BID with meals, with no refills. Tonya Bartlett will continue to work on weight loss, exercise, and decreasing simple carbohydrates to help decrease the risk of diabetes.   - metFORMIN (GLUCOPHAGE) 500 MG tablet; Take 1 tablet (500 mg total) by mouth 2 (two) times daily with a meal.  Dispense: 60 tablet; Refill: 0  3. Obesity, Current BMI 35.5 Tonya Bartlett is currently in the action stage of change. As such, her goal is to continue  with weight loss efforts. She has agreed to the Category 2 Plan.   Exercise goals: As is.   Behavioral modification strategies: celebration eating strategies.  Tonya Bartlett has agreed to follow-up with our clinic in 3 weeks. She was informed of the importance of frequent follow-up visits to maximize her success with intensive lifestyle modifications for her multiple health conditions.   Objective:   Blood pressure (!) 151/84, pulse (!) 53, temperature 97.6 F (36.4 C), height '5\' 6"'$  (1.676 m), weight 220 lb (99.8 kg), SpO2 98 %. Body mass index is 35.51 kg/m.  General: Cooperative, alert, well developed, in no acute distress. HEENT: Conjunctivae and lids unremarkable. Cardiovascular: Regular rhythm.  Lungs: Normal work of breathing. Neurologic: No focal deficits.   Lab Results  Component Value Date   CREATININE 0.70 06/11/2021   BUN 11 06/11/2021   NA 141 06/11/2021   K 4.4 06/11/2021   CL 104 06/11/2021   CO2 25 06/11/2021   Lab Results  Component Value Date   ALT 17 06/11/2021   AST 20 06/11/2021   ALKPHOS 61 06/11/2021   BILITOT 0.6 06/11/2021   Lab Results  Component Value Date   HGBA1C 5.3 06/11/2021   HGBA1C 5.4 04/24/2021   HGBA1C 5.3 11/05/2016   Lab Results  Component Value Date   INSULIN 9.6 06/11/2021   Lab Results  Component Value Date   TSH 2.880 06/11/2021   Lab Results  Component Value Date   CHOL 166  06/11/2021   HDL 61 06/11/2021   LDLCALC 90 06/11/2021   LDLDIRECT 187.9 10/08/2009   TRIG 82 06/11/2021   CHOLHDL 2.7 06/11/2021   Lab Results  Component Value Date   VD25OH 34.3 06/11/2021   Lab Results  Component Value Date   WBC 3.7 06/11/2021   HGB 12.2 06/11/2021   HCT 36.3 06/11/2021   MCV 98 (H) 06/11/2021   PLT 250 06/11/2021   No results found for: "IRON", "TIBC", "FERRITIN"  Attestation Statements:   Reviewed by clinician on day of visit: allergies, medications, problem list, medical history, surgical history, family history,  social history, and previous encounter notes.   I, Felix Pratt, am acting as transcriptionist for [name], MD.  I have reviewed the above documentation for accuracy and completeness, and I agree with the above. -  [name], MD

## 2021-09-16 DIAGNOSIS — M150 Primary generalized (osteo)arthritis: Secondary | ICD-10-CM | POA: Diagnosis not present

## 2021-09-16 DIAGNOSIS — M47812 Spondylosis without myelopathy or radiculopathy, cervical region: Secondary | ICD-10-CM | POA: Diagnosis not present

## 2021-09-16 DIAGNOSIS — Z79891 Long term (current) use of opiate analgesic: Secondary | ICD-10-CM | POA: Diagnosis not present

## 2021-09-16 DIAGNOSIS — L4052 Psoriatic arthritis mutilans: Secondary | ICD-10-CM | POA: Diagnosis not present

## 2021-09-16 DIAGNOSIS — G894 Chronic pain syndrome: Secondary | ICD-10-CM | POA: Diagnosis not present

## 2021-09-17 ENCOUNTER — Encounter (INDEPENDENT_AMBULATORY_CARE_PROVIDER_SITE_OTHER): Payer: Self-pay | Admitting: Family Medicine

## 2021-09-17 ENCOUNTER — Ambulatory Visit (INDEPENDENT_AMBULATORY_CARE_PROVIDER_SITE_OTHER): Payer: Medicare Other | Admitting: Family Medicine

## 2021-09-17 VITALS — BP 120/78 | HR 92 | Temp 98.0°F | Ht 66.0 in | Wt 219.0 lb

## 2021-09-17 DIAGNOSIS — Z6835 Body mass index (BMI) 35.0-35.9, adult: Secondary | ICD-10-CM | POA: Diagnosis not present

## 2021-09-17 DIAGNOSIS — E669 Obesity, unspecified: Secondary | ICD-10-CM

## 2021-09-17 DIAGNOSIS — E559 Vitamin D deficiency, unspecified: Secondary | ICD-10-CM

## 2021-09-17 DIAGNOSIS — I10 Essential (primary) hypertension: Secondary | ICD-10-CM | POA: Diagnosis not present

## 2021-09-17 MED ORDER — VITAMIN D (ERGOCALCIFEROL) 1.25 MG (50000 UNIT) PO CAPS: 50000.0000 [IU] | ORAL_CAPSULE | ORAL | 0 refills | Status: DC

## 2021-09-18 ENCOUNTER — Other Ambulatory Visit: Payer: Self-pay | Admitting: Adult Health

## 2021-09-18 ENCOUNTER — Other Ambulatory Visit (INDEPENDENT_AMBULATORY_CARE_PROVIDER_SITE_OTHER): Payer: Self-pay | Admitting: Family Medicine

## 2021-09-18 DIAGNOSIS — R7303 Prediabetes: Secondary | ICD-10-CM

## 2021-09-19 ENCOUNTER — Ambulatory Visit: Payer: Medicare Other | Admitting: Cardiovascular Disease

## 2021-09-23 DIAGNOSIS — R102 Pelvic and perineal pain: Secondary | ICD-10-CM | POA: Diagnosis not present

## 2021-09-23 DIAGNOSIS — N3020 Other chronic cystitis without hematuria: Secondary | ICD-10-CM | POA: Diagnosis not present

## 2021-09-24 ENCOUNTER — Encounter (INDEPENDENT_AMBULATORY_CARE_PROVIDER_SITE_OTHER): Payer: Self-pay

## 2021-09-25 DIAGNOSIS — M25562 Pain in left knee: Secondary | ICD-10-CM | POA: Diagnosis not present

## 2021-09-25 DIAGNOSIS — Z96652 Presence of left artificial knee joint: Secondary | ICD-10-CM | POA: Diagnosis not present

## 2021-09-25 DIAGNOSIS — Z96651 Presence of right artificial knee joint: Secondary | ICD-10-CM | POA: Diagnosis not present

## 2021-09-28 ENCOUNTER — Other Ambulatory Visit: Payer: Self-pay | Admitting: Gastroenterology

## 2021-09-28 NOTE — Progress Notes (Unsigned)
Chief Complaint:   OBESITY Tonya Bartlett is here to discuss her progress with her obesity treatment plan along with follow-up of her obesity related diagnoses. Tonya Bartlett is on the Category 2 Plan and states she is following her eating plan approximately 60-70% of the time. Tonya Bartlett states she is doing leg lifts for 15 minutes 3 times per week.  Today's visit was #: 6 Starting weight: 222 lbs Starting date: 06/11/2021 Today's weight: 219 lbs Today's date: 09/17/2021 Total lbs lost to date: 3 Total lbs lost since last in-office visit: 1  Interim History: Tonya Bartlett did some celebration eating for her birthday.  She is working on increasing protein and portion controlling with social eating.  She continues to do well with her loss.  Subjective:   1. Vitamin D deficiency Tonya Bartlett is on vitamin D with no side effects noted.  Her vitamin D level is not yet at goal.  2. Primary hypertension Tonya Bartlett's blood pressure is well controlled with her diet and medications.  She denies signs of hypotension.  Assessment/Plan:   1. Vitamin D deficiency We will refill prescription Vitamin D 50,000 IU every week for 1 month. Tonya Bartlett will follow-up for routine testing of Vitamin D, at least 2-3 times per year to avoid over-replacement.  - Vitamin D, Ergocalciferol, (DRISDOL) 1.25 MG (50000 UNIT) CAPS capsule; Take 1 capsule (50,000 Units total) by mouth every 7 (seven) days.  Dispense: 4 capsule; Refill: 0  2. Primary hypertension Tonya Bartlett will continue with her diet and exercise. She goal is to be able to decrease the need for medications.  3. Obesity, Current BMI 35.4 Tonya Bartlett is currently in the action stage of change. As such, her goal is to continue with weight loss efforts. She has agreed to the Category 2 Plan.   Exercise goals: As is.   Behavioral modification strategies: increasing lean protein intake and increasing water intake.  Tonya Bartlett has agreed to follow-up with our clinic in 3 weeks. She was informed of  the importance of frequent follow-up visits to maximize her success with intensive lifestyle modifications for her multiple health conditions.   Objective:   Blood pressure 120/78, pulse 92, temperature 98 F (36.7 C), height '5\' 6"'$  (1.676 m), weight 219 lb (99.3 kg), SpO2 97 %. Body mass index is 35.35 kg/m.  General: Cooperative, alert, well developed, in no acute distress. HEENT: Conjunctivae and lids unremarkable. Cardiovascular: Regular rhythm.  Lungs: Normal work of breathing. Neurologic: No focal deficits.   Lab Results  Component Value Date   CREATININE 0.70 06/11/2021   BUN 11 06/11/2021   NA 141 06/11/2021   K 4.4 06/11/2021   CL 104 06/11/2021   CO2 25 06/11/2021   Lab Results  Component Value Date   ALT 17 06/11/2021   AST 20 06/11/2021   ALKPHOS 61 06/11/2021   BILITOT 0.6 06/11/2021   Lab Results  Component Value Date   HGBA1C 5.3 06/11/2021   HGBA1C 5.4 04/24/2021   HGBA1C 5.3 11/05/2016   Lab Results  Component Value Date   INSULIN 9.6 06/11/2021   Lab Results  Component Value Date   TSH 2.880 06/11/2021   Lab Results  Component Value Date   CHOL 166 06/11/2021   HDL 61 06/11/2021   LDLCALC 90 06/11/2021   LDLDIRECT 187.9 10/08/2009   TRIG 82 06/11/2021   CHOLHDL 2.7 06/11/2021   Lab Results  Component Value Date   VD25OH 34.3 06/11/2021   Lab Results  Component Value Date   WBC 3.7  06/11/2021   HGB 12.2 06/11/2021   HCT 36.3 06/11/2021   MCV 98 (H) 06/11/2021   PLT 250 06/11/2021   No results found for: "IRON", "TIBC", "FERRITIN"  Attestation Statements:   Reviewed by clinician on day of visit: allergies, medications, problem list, medical history, surgical history, family history, social history, and previous encounter notes.   I, Tonya Bartlett, am acting as transcriptionist for [name], MD.  I have reviewed the above documentation for accuracy and completeness, and I agree with the above. -  [name], MD

## 2021-10-01 DIAGNOSIS — M25562 Pain in left knee: Secondary | ICD-10-CM | POA: Diagnosis not present

## 2021-10-01 DIAGNOSIS — Z96652 Presence of left artificial knee joint: Secondary | ICD-10-CM | POA: Diagnosis not present

## 2021-10-01 DIAGNOSIS — M25362 Other instability, left knee: Secondary | ICD-10-CM | POA: Diagnosis not present

## 2021-10-06 DIAGNOSIS — Z96652 Presence of left artificial knee joint: Secondary | ICD-10-CM | POA: Diagnosis not present

## 2021-10-06 DIAGNOSIS — M25562 Pain in left knee: Secondary | ICD-10-CM | POA: Diagnosis not present

## 2021-10-06 DIAGNOSIS — M25362 Other instability, left knee: Secondary | ICD-10-CM | POA: Diagnosis not present

## 2021-10-07 ENCOUNTER — Ambulatory Visit (INDEPENDENT_AMBULATORY_CARE_PROVIDER_SITE_OTHER): Payer: Medicare Other | Admitting: Neurology

## 2021-10-07 ENCOUNTER — Encounter: Payer: Self-pay | Admitting: Neurology

## 2021-10-07 VITALS — BP 153/78 | HR 67 | Ht 66.0 in | Wt 219.0 lb

## 2021-10-07 DIAGNOSIS — G3184 Mild cognitive impairment, so stated: Secondary | ICD-10-CM

## 2021-10-07 MED ORDER — CEREFOLIN 6-1-50-5 MG PO TABS
1.0000 | ORAL_TABLET | Freq: Every day | ORAL | 1 refills | Status: DC
Start: 2021-10-07 — End: 2021-10-09

## 2021-10-07 NOTE — Progress Notes (Signed)
Patient: Tonya Bartlett Date of Birth: 03/10/1945  Reason for Visit: Follow up History from: Patient Primary Neurologist: Dr. Leonie Man   ASSESSMENT AND PLAN 76 y.o. year old female   1.  Mild cognitive impairment -Doing well, stable, no major changes, MMSE 30/30 -Continue Cerefolin NAC for memory -Continue brain stimulating exercises -Continue follow-up with PCP, return here for new or worsening symptoms  HISTORY  Reason for Referral:  Memory loss HPI: Initial Consult 09/19/15 :  Tonya Bartlett is a 29 year Caucasian lady who for the last year and a half has been having mild memory and cognitive difficulties. She often misplaces objects but can remember later. She is also having word finding issues and trouble getting her words out. At times she has had trouble driving to places that she has driven down  To numerous times before and yet for a few seconds cannot remember where it done. She is also forgetting names of the patient's. 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 There is no history of strokes seizures significant head injury with loss of consciousness. There is no family history of Alzheimer's dementia. Patient has not had any brain imaging studies done on November done for reversible causes of cognitive impairment. She denies feeling depressed. Past medical history significant for irritable bowel syndrome and syncopal episodes. Update 12/09/2015 : She returns for follow-up after last visit 2 and half months ago. She continues from mild short-term memory difficulties but these appear to be stable and not getting worse. Patient did not fill the prescription for self: She forgot. She had MRI scan the brain done on 10/09/2015 which I personally reviewed and shows only mild changes of chronic microvascular ischemia without any  structural lesions tumor infarcts. Lab work done on 8/ 3/17 showed normal vitamin B12, TSH and RPR. EEG done on 10/24/15 was normal. Patient was interested in participating in the [facility] early dementia study but she does not have a reliable caregiver to help her participate. She states he is taking Celexa and her depression is well controlled. She has no new complaints today. Update 06/08/2016 : She returns for followup after last visit 6 months ago. She states she continues to have mi and cognitive difficulties which are however unchanged. She has trouble finding words occasionally and completing sentences. She has been taking Cerefolin nac and find it helps.  She does play solitaire but does not participate in any other cognitively challenging activities and states her job is quite taxing and stressful. She has no neurological complaints today.Marland Kitchen Update 08/30/2017 : She returns for follow-up after last visit more than a year ago. She continues to do well and states she has not had any worsening of her memory or cognitive difficulties. She continues to have short-term memory difficulties. 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 She also play solitaire a lot. 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 She is recovered from that and is now doing a lot of swimming to help her. She feels her depression has gotten worse and despite being on Wellbutrin. She plans to discuss with primary physician referral to a psychiatrist. Denies any other neurological complaints. She remains on; Cerefolin  NAC which is tolerating well. Mini-Mental status exam score today was 30/30. Update 09/01/2018 : She returns for follow-up after last visit a year ago.  She states she is doing well.  She feels her memory difficulties in fact may be slightly better.  She remains on  Cerefolin NAC which she takes regularly and needs a refill.  She still gets confused easily.  She often misses appointment.  In fact today she called stating that she would not be able to do her video visit due to technical difficulties and was switched to in person visit.  Her son now lives with her and she enjoys having him.  Her daughter also spends a lot of time with her and comes over frequently.  She does play bridge.  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  Her blood pressures well controlled and today it is 122/66.  She is still bothered by bad hip and is planning to get a steroid injection in the next week. Update 01/18/2019 ; she returns for follow-up after last visit for a number of months ago.  She states she discontinued Cerefolin as she felt the cost was too much.  She feels it may have been helping her and short-term memory may have gotten worse.  She now wants to restart Cerefolin and is asking for a prescription.  She is mostly retired but does do some work mostly from home over the phone.  She continues to have mild short-term memory and cognitive difficulties but these do not appear to be progressing.  She has new complaint today of restless right foot.  She has a constant urge to move it and wiggles her foot constantly when she is sitting and not moving.  She is able to sleep well at night but does take trazodone which helps her sleep. Update 09/26/2019: She returns for follow-up after last visit 8 months ago.  She missed an appointment last month.  She states she continues to have mild short-term memory difficulties as well as occasional word finding difficulties but is able to remember them later.  This is unchanged now for the last couple of years.  She is recently retired from a job.  Patient was taking Cerefolin in the past but try to discontinue it as it is too expensive but noticed that her symptoms are getting worse so she has recently restarted  taking it.  She continues to have involuntary movement in the right leg and she was unable to tolerate gabapentin 100 mg 3 times daily and is only on twice daily now but feels she can tolerate better but it is not helping as much.  She underwent right hip replacement 2 months ago and is able to ambulate with a cane but can walk short distances without it.  On Mini-Mental status exam testing today she scored 29/30 and was able to name 15 animals which can walk on 4 legs.  This is unchanged from last visit Update 09/18/2020: She returns for follow-up after last visit a year ago.  Patient states her memory difficulties and cognitive issues are more or less unchanged.  She in fact did quite well on the Mini-Mental status exam today she scored 30/30.  She continues to play solitaire and also reads books.  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  She denies any symptoms of stroke or TIA.  She does feel her blood pressure has been quite elevated recently  with systolics in the 423N.  She is working with her primary care physician to get it under better control.  She has had several health issues and had a few falls and fractured her ankle more than once requiring multiple surgeries.  She recently saw Dr. Benjamine Mola ENT physician for her dizziness but I do not have his records to review.  Patient had seen Dr. Melissa Montane, ENT in 2017 for decreased hearing on the left and at that time had reported bilateral tinnitus as well.  Dr. Benjamine Mola ordered an MRI scan of the brain which was done on 10/07/2020 with thin sections through the internal auditory canal and showed no evidence of tumor or acute abnormality.  There are mild changes of chronic small vessel disease noted.  She also had an endoscopy for esophageal dilatation and during postprocedure developed paroxysmal A. fib.  She has seen Dr. Quay Burow cardiologist who plans to see her back to discuss anticoagulation options options as  he did outpatient Holter monitoring which also showed several months of paroxysmal A. fib.  Update October 07, 2021 SS: Doing well, diagnosed with breast cancer last year, had lumpectomy, radiation, in remission. Still memory issues, mostly word finding issues. Thinks age related. March 2020 stopped practicing psychotherapy, addiction recovery. Lives alone, does her own ADLs. Has 2 dogs and garden. On AFIB, on Eliquis. Has trouble swallowing. MMS 30/30 today. Remains on Cerefolin.   REVIEW OF SYSTEMS: Out of a complete 14 system review of symptoms, the patient complains only of the following symptoms, and all other reviewed systems are negative.  See HPI  ALLERGIES: Allergies  Allergen Reactions   Otezla [Apremilast] Anaphylaxis    Suicidal ideation   Penicillins Anaphylaxis    Has patient had a PCN reaction causing immediate rash, facial/tongue/throat swelling, SOB or lightheadedness with hypotension: Yes Has patient had a PCN reaction causing severe rash involving mucus membranes or skin necrosis: Yes Has patient had a PCN reaction that required hospitalization: No Has patient had a PCN reaction occurring within the last 10 year No If all of the above answers are "NO", then may proceed with Cephalosporin use.    Erythromycin Other (See Comments)    SEVERE STOMACH CRAMPS   Morphine And Related Nausea And Vomiting   Nsaids Other (See Comments)    SEVERE STOMACH CRAMPS, MOUTH SORES **Able to tolerate Tylenol   Remicade [Infliximab] Other (See Comments)    Shut down immune system-BP high   Tolmetin     SEVERE STOMACH CRAMPS   Nickel Rash    Including snaps on hospital gowns     HOME MEDICATIONS: Outpatient Medications Prior to Visit  Medication Sig Dispense Refill   acetaminophen (TYLENOL) 325 MG tablet Take 650 mg by mouth every 6 (six) hours as needed for moderate pain.     Antiseborrheic Products, Misc. (PROMISEB) CREA Uses topical daily as needed on her face     Ascorbic Acid  (VITAMIN C) 1000 MG tablet Take 1,000 mg by mouth daily.     atorvastatin (LIPITOR) 40 MG tablet Take 1 tablet (40 mg total) by mouth daily. 90 tablet 3   betamethasone dipropionate 0.05 % lotion APPLY TOPICALLY TO AFFECTED AREA EVERY DAY 60 mL 1   ELIQUIS 5 MG TABS tablet TAKE 1 TABLET BY MOUTH TWICE A DAY 180 tablet 1   famotidine (PEPCID) 20 MG tablet Take 1 tablet (20 mg total) by mouth daily as needed for heartburn or indigestion. (Patient taking differently: Take 20 mg by  mouth daily.) 90 tablet 3   FIBER PO Take 1 capsule by mouth daily. Pt takes in the evening.     HYDROcodone-acetaminophen (NORCO/VICODIN) 5-325 MG tablet 1 tablet as needed     hydrocortisone cream 1 % Apply 1 application topically daily as needed for itching.     L-Methylfolate-B12-B6-B2 (CEREFOLIN) 07-17-48-5 MG TABS TAKE 1 TABLET BY MOUTH EVERY DAY 90 tablet 0   losartan (COZAAR) 100 MG tablet TAKE 1 TABLET BY MOUTH EVERY DAY 90 tablet 2   metFORMIN (GLUCOPHAGE) 500 MG tablet Take 1 tablet (500 mg total) by mouth 2 (two) times daily with a meal. 60 tablet 0   metoprolol tartrate (LOPRESSOR) 25 MG tablet TAKE 1/2 TABLET BY MOUTH TWICE A DAY 90 tablet 1   omeprazole (PRILOSEC) 40 MG capsule TAKE 1 CAPSULE (40 MG TOTAL) BY MOUTH DAILY. 90 capsule 1   rizatriptan (MAXALT) 10 MG tablet Take 1 tablet (10 mg total) by mouth as needed for migraine. May repeat in 2 hours if needed 10 tablet 2   sulfamethoxazole-trimethoprim (BACTRIM,SEPTRA) 400-80 MG tablet Take 1 tablet by mouth daily. For urethritis.     tamoxifen (NOLVADEX) 10 MG tablet TAKE 0.5 TABLETS BY MOUTH 2 TIMES DAILY. 90 tablet 3   traZODone (DESYREL) 50 MG tablet TAKE 1 TABLET BY MOUTH AT BEDTIME 90 tablet 1   venlafaxine XR (EFFEXOR-XR) 37.5 MG 24 hr capsule Take 1 capsule (37.5 mg total) by mouth daily with breakfast. 90 capsule 0   Vitamin D, Ergocalciferol, (DRISDOL) 1.25 MG (50000 UNIT) CAPS capsule Take 1 capsule (50,000 Units total) by mouth every 7 (seven)  days. 4 capsule 0   No facility-administered medications prior to visit.    PAST MEDICAL HISTORY: Past Medical History:  Diagnosis Date   Alcohol abuse    Sober 41 years.   Allergic rhinitis    Allergy    Anemia    Anxiety    Blood transfusion without reported diagnosis    Breast cancer ([place]) 12/2020   left   Cancer ([place])    Cataract    bil cataracts removed   Clotting disorder ([place])    DVT- 1996 po knee replacement   Colitis    Complication of anesthesia    vomit x1 while on Morphine per pt   Depression    Diverticulosis    Dysrhythmia    Atrial fibrillation   Fatigue    Frequency of urination    GERD (gastroesophageal reflux disease)    History of colon polyps    History of DVT of lower extremity    POST LEFT TOTAL KNEE  1996   History of hiatal hernia    History of iron deficiency anemia 1996   iron infusion   History of migraine headaches    History of radiation therapy    left breast 03/27/2021-04/23/2021 Dr [name]   History of rib fracture    Hyperlipidemia    Hypertension    IBS (irritable bowel syndrome)    Insomnia    Joint pain    MCI (mild cognitive impairment)    Melanoma ([place]) 2019   left leg    Migraine headache    hx of migraines    OA (osteoarthritis)    RIGHT SHOULDER   Osteopenia    Pneumonia    remote history   PONV (postoperative nausea and vomiting)    Recovering alcoholic ([place])    SINCE [id number]   Right rotator cuff tear    SOB (shortness of  breath) on exertion    Substance abuse ([place])    recovering alcoholic   Swallowing difficulty    Unspecified essential hypertension     PAST SURGICAL HISTORY: Past Surgical History:  Procedure Laterality Date   BREAST BIOPSY Left 01/06/2021   pos   BREAST LUMPECTOMY WITH RADIOACTIVE SEED LOCALIZATION Left 02/27/2021   Procedure: LEFT BREAST LUMPECTOMY WITH RADIOACTIVE SEED LOCALIZATION;  Surgeon: [name], MD;  Location: [facility];  Service: General;  Laterality: Left;    BUNIONECTOMY/  HAMMERTOE CORRECTION  RIGHT FOOT  2011   CATARACT EXTRACTION W/ INTRAOCULAR LENS  IMPLANT, BILATERAL     COLONOSCOPY     DILATION AND CURETTAGE OF UTERUS  1976   EYE SURGERY Bilateral 2016   cataract removal   KNEE ARTHROSCOPY W/ MENISCECTOMY Bilateral X2  LEFT /    X1  RIGHT   KNEE OPEN LATERAL RELEASE Bilateral    MOHS SURGERY Left 12/15/2017   Melanoma in situ - [facility] calf [facility]   ORIF ANKLE FRACTURE Right 08/04/2020   Procedure: OPEN REDUCTION INTERNAL FIXATION (ORIF) ANKLE FRACTURE;  Surgeon: Wylene Simmer, MD;  Location: [facility];  Service: Orthopedics;  Laterality: Right;  Mini C-arm, [employer] Small Frag   REPLACEMENT TOTAL KNEE Left 2006   SHOULDER ARTHROSCOPY WITH SUBACROMIAL DECOMPRESSION, ROTATOR CUFF REPAIR AND BICEP TENDON REPAIR Right 05/23/2013   Procedure: RIGHT SHOULDER ARTHROSCOPY EXAM UNDER ANESTHESIA  WITH SUBACROMIAL DECOMPRESSION,DISTAL CLAVICLE RESECTION, SADLABRAL DEBRIDEMENT CHONDROPLASTY, BICEP TENOTOMY ;  Surgeon: Sydnee Cabal, MD;  Location: [facility];  Service: Orthopedics;  Laterality: Right;   TONSILLECTOMY AND ADENOIDECTOMY  AGE 33   TOTAL HIP ARTHROPLASTY Left 05/04/2017   Procedure: LEFT TOTAL HIP ARTHROPLASTY ANTERIOR APPROACH;  Surgeon: Paralee Cancel, MD;  Location: [facility];  Service: Orthopedics;  Laterality: Left;   TOTAL HIP ARTHROPLASTY Right 08/01/2019   Procedure: TOTAL HIP ARTHROPLASTY ANTERIOR APPROACH;  Surgeon: Paralee Cancel, MD;  Location: [facility];  Service: Orthopedics;  Laterality: Right;  49mns   TOTAL KNEE ARTHROPLASTY Bilateral LEFT  1996/   RIGHT 2004   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      UPPER GASTROINTESTINAL ENDOSCOPY     UPPER GI ENDOSCOPY     VAGINAL HYSTERECTOMY  1976    FAMILY HISTORY: Family History  Problem Relation Age of Onset   Cancer Mother    Heart disease Mother    Hypertension Mother    Melanoma Mother [phone]  Obesity Mother    Heart disease Father 623  Hypertension  Father    Esophageal cancer Brother    Dementia Paternal Grandfather    Melanoma Niece 121  Colon cancer Neg Hx    Colon polyps Neg Hx    Stomach cancer Neg Hx    Rectal cancer Neg Hx     SOCIAL HISTORY: Social History   Socioeconomic History   Marital status: Widowed    Spouse name: Not on file   Number of children: 3   Years of education: Not on file   Highest education level: Master's degree (e.g., MA, Tonya, MEng, MEd, MSW, MBA)  Occupational History   Occupation: retired tTransport planner Tobacco Use   Smoking status: Former    Packs/day: 0.50    Years: 15.00    Total pack years: 7.50    Types: Cigarettes    Quit date: 05/15/1985    Years since quitting: 36.4   Smokeless tobacco: Never  Vaping Use   Vaping Use: Never used  Substance and Sexual Activity   Alcohol use: No    Comment: RECOVERING ALCOHOLIC--  QUIT [phone]   Drug use: No   Sexual activity: Not Currently    Birth control/protection: Post-menopausal  Other Topics Concern   Not on file  Social History Narrative   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    Widowed    Three kids    62 grandchildren       Social Determinants of Health   Financial Resource Strain: Low Risk  (04/18/2021)   Overall Financial Resource Strain (CARDIA)    Difficulty of Paying Living Expenses: Not hard at all  Food Insecurity: No Food Insecurity (04/18/2021)   Hunger Vital Sign    Worried About Running Out of Food in the Last Year: Never true    [place] in the Last Year: Never true  Transportation Needs: No Transportation Needs (04/18/2021)   PRAPARE - Hydrologist (Medical): No    Lack of Transportation (Non-Medical): No  Physical Activity: Inactive (04/18/2021)   Exercise Vital Sign    Days of Exercise per Week: 0 days    Minutes of Exercise per Session: 0 min  Stress: No Stress Concern Present (04/18/2021)   [place]    Feeling of Stress : Not at all  Social Connections: Moderately Integrated (04/18/2021)   Social Connection and Isolation Panel [NHANES]    Frequency of Communication with Friends and Family: More than three times a week    Frequency of Social Gatherings with Friends and Family: More than three times a week    Attends Religious Services: More than 4 times per year    Active Member of [employer] or Organizations: Yes    Attends Archivist Meetings: More than 4 times per year    Marital Status: Widowed  Intimate Partner Violence: Not At Risk (04/18/2021)   Humiliation, Afraid, Rape, and Kick questionnaire    Fear of Current or Ex-Partner: No    Emotionally Abused: No    Physically Abused: No    Sexually Abused: No    PHYSICAL EXAM  Vitals:   10/07/21 1503  BP: (!) 153/78  Pulse: 67  Weight: 219 lb (99.3 kg)  Height: '5\' 6"'$  (1.676 m)   Body mass index is 35.35 kg/m.  Generalized: Well developed, in no acute distress  Neurological examination  Mentation: Alert oriented to time, place, history taking. Follows all commands speech and language fluent. MMSE 30/30.  Cranial nerve II-XII: Pupils were equal round reactive to light. Extraocular movements were full, visual field were full on confrontational test. Facial sensation and strength were normal. Head turning and shoulder shrug  were normal and symmetric. Motor: The motor testing reveals 5 over 5 strength of all 4 extremities. Good symmetric motor tone is noted throughout.  Sensory: Sensory testing is intact to soft touch on all 4 extremities. No evidence of extinction is noted.  Coordination: Cerebellar testing reveals good finger-nose-finger and heel-to-shin bilaterally.  Gait and station: Gait is normal. Tandem gait is normal. Romberg is negative. No drift is seen.  Reflexes: Deep tendon reflexes are symmetric but decreased  DIAGNOSTIC DATA (LABS, IMAGING, TESTING) - I reviewed patient  records, labs, notes, testing and imaging myself where available.  Lab Results  Component Value Date   WBC 3.7 06/11/2021   HGB 12.2 06/11/2021   HCT 36.3 06/11/2021   MCV 98 (  H) 06/11/2021   PLT 250 06/11/2021      Component Value Date/Time   NA 141 06/11/2021 1000   K 4.4 06/11/2021 1000   CL 104 06/11/2021 1000   CO2 25 06/11/2021 1000   GLUCOSE 99 06/11/2021 1000   GLUCOSE 89 04/24/2021 0915   BUN 11 06/11/2021 1000   CREATININE 0.70 06/11/2021 1000   CREATININE 0.65 01/15/2021 1245   CREATININE 0.68 08/30/2015 1531   CALCIUM 9.2 06/11/2021 1000   PROT 6.9 06/11/2021 1000   ALBUMIN 4.4 06/11/2021 1000   AST 20 06/11/2021 1000   AST 22 01/15/2021 1245   ALT 17 06/11/2021 1000   ALT 19 01/15/2021 1245   ALKPHOS 61 06/11/2021 1000   BILITOT 0.6 06/11/2021 1000   BILITOT 0.9 01/15/2021 1245   GFRNONAA >60 02/12/2021 1433   GFRNONAA >60 01/15/2021 1245   GFRAA >60 08/02/2019 0306   Lab Results  Component Value Date   CHOL 166 06/11/2021   HDL 61 06/11/2021   LDLCALC 90 06/11/2021   LDLDIRECT 187.9 10/08/2009   TRIG 82 06/11/2021   CHOLHDL 2.7 06/11/2021   Lab Results  Component Value Date   HGBA1C 5.3 06/11/2021   Lab Results  Component Value Date   VITAMINB12 1,927 (H) 06/11/2021   Lab Results  Component Value Date   TSH 2.880 06/11/2021    Butler Denmark, AGNP-C, DNP 10/07/2021, 3:21 PM [facility] [address] [place], [place] [address] [phone]

## 2021-10-08 ENCOUNTER — Ambulatory Visit (INDEPENDENT_AMBULATORY_CARE_PROVIDER_SITE_OTHER): Payer: Medicare Other | Admitting: Family Medicine

## 2021-10-08 ENCOUNTER — Encounter (INDEPENDENT_AMBULATORY_CARE_PROVIDER_SITE_OTHER): Payer: Self-pay | Admitting: Family Medicine

## 2021-10-08 VITALS — BP 137/74 | HR 82 | Temp 97.9°F | Ht 66.0 in | Wt 214.0 lb

## 2021-10-08 DIAGNOSIS — E559 Vitamin D deficiency, unspecified: Secondary | ICD-10-CM

## 2021-10-08 DIAGNOSIS — E669 Obesity, unspecified: Secondary | ICD-10-CM | POA: Diagnosis not present

## 2021-10-08 DIAGNOSIS — Z6834 Body mass index (BMI) 34.0-34.9, adult: Secondary | ICD-10-CM

## 2021-10-08 DIAGNOSIS — R7303 Prediabetes: Secondary | ICD-10-CM

## 2021-10-08 MED ORDER — VITAMIN D (ERGOCALCIFEROL) 1.25 MG (50000 UNIT) PO CAPS: 50000.0000 [IU] | ORAL_CAPSULE | ORAL | 0 refills | Status: DC

## 2021-10-08 MED ORDER — METFORMIN HCL 500 MG PO TABS: 500.0000 mg | ORAL_TABLET | Freq: Two times a day (BID) | ORAL | 0 refills | Status: DC

## 2021-10-09 ENCOUNTER — Encounter: Payer: Self-pay | Admitting: Gastroenterology

## 2021-10-09 ENCOUNTER — Ambulatory Visit (INDEPENDENT_AMBULATORY_CARE_PROVIDER_SITE_OTHER): Payer: Medicare Other | Admitting: Gastroenterology

## 2021-10-09 ENCOUNTER — Telehealth: Payer: Self-pay | Admitting: Neurology

## 2021-10-09 ENCOUNTER — Telehealth: Payer: Self-pay

## 2021-10-09 VITALS — BP 150/84 | HR 61 | Ht 66.5 in | Wt 216.0 lb

## 2021-10-09 DIAGNOSIS — Z7901 Long term (current) use of anticoagulants: Secondary | ICD-10-CM | POA: Diagnosis not present

## 2021-10-09 DIAGNOSIS — Z86010 Personal history of colonic polyps: Secondary | ICD-10-CM

## 2021-10-09 DIAGNOSIS — R1310 Dysphagia, unspecified: Secondary | ICD-10-CM

## 2021-10-09 DIAGNOSIS — R931 Abnormal findings on diagnostic imaging of heart and coronary circulation: Secondary | ICD-10-CM | POA: Diagnosis not present

## 2021-10-09 DIAGNOSIS — K219 Gastro-esophageal reflux disease without esophagitis: Secondary | ICD-10-CM

## 2021-10-09 MED ORDER — CEREFOLIN 6-1-50-5 MG PO TABS: 1.0000 | ORAL_TABLET | Freq: Every day | ORAL | 1 refills | Status: DC

## 2021-10-09 MED ORDER — SUTAB 1479-225-188 MG PO TABS: 1.0000 | ORAL_TABLET | ORAL | 0 refills | Status: DC

## 2021-10-09 NOTE — Addendum Note (Signed)
Addended by: Cristela Felt E on: 10/09/2021 12:02 PM   Modules accepted: Orders

## 2021-10-09 NOTE — Telephone Encounter (Signed)
Pt has called to report the   L-Methylfolate-B12-B6-B2 (CEREFOLIN) 07-17-48-5 MG TABS, was supposed to be called into [facility], [place]  Phone:  [phone] Which is less expensive.

## 2021-10-09 NOTE — Patient Instructions (Signed)
Continue omeprazole.   You can take over the counter Pepcid/Gaviscon as needed for breakthrough reflux symptoms.   You have been scheduled for a colonoscopy. Please follow written instructions given to you at your visit today.  Please pick up your prep supplies at the pharmacy within the next 1-3 days. If you use inhalers (even only as needed), please bring them with you on the day of your procedure.  You will be contacted by [facility] in the next 7 days to arrange a Barium swallow.  The number on your caller ID will be [phone], please answer when they call.  If you have not heard from them in 7 days please call [phone] to schedule.    Due to recent changes in healthcare laws, you may see the results of your imaging and laboratory studies on MyChart before your provider has had a chance to review them.  We understand that in some cases there may be results that are confusing or concerning to you. Not all laboratory results come back in the same time frame and the provider may be waiting for multiple results in order to interpret others.  Please give [name] 48 hours in order for your provider to thoroughly review all the results before contacting the office for clarification of your results.   _______________________________________________________  If you are age 93 or older, your body mass index should be between 23-30. Your Body mass index is 34.34 kg/m. If this is out of the aforementioned range listed, please consider follow up with your Primary Care Provider.  If you are age 30 or younger, your body mass index should be between 19-25. Your Body mass index is 34.34 kg/m. If this is out of the aformentioned range listed, please consider follow up with your Primary Care Provider.   ________________________________________________________  The [facility] providers would like to encourage you to use [facility] to communicate with providers for non-urgent requests or  questions.  Due to long hold times on the telephone, sending your provider a message by [facility] may be a faster and more efficient way to get a response.  Please allow 48 business hours for a response.  Please remember that this is for non-urgent requests.  _______________________________________________________

## 2021-10-09 NOTE — Telephone Encounter (Signed)
Rx sent to requested pharmacy

## 2021-10-09 NOTE — Telephone Encounter (Signed)
[facility] Pre-operative Risk Assessment     Request for surgical clearance:     Endoscopy Procedure  What type of surgery is being performed?     colonoscopy  When is this surgery scheduled?     11/18/21  What type of clearance is required ?   Pharmacy  Are there any medications that need to be held prior to surgery and how long? Plavix x 5 days  Practice name and name of physician performing surgery?      [facility]  What is your office phone and fax number?      Phone- [phone]  [phone]  Anesthesia type (None, local, MAC, general) ?       MAC

## 2021-10-09 NOTE — Progress Notes (Signed)
HPI :  76 y/o female here for a follow up visit for dysphagia and GERD, history of colon polyps.    I last saw the patient in September 2022.  She had an EGD done for her symptoms of dysphagia.  Report as outlined below.  She had had a 3 cm hiatal hernia, otherwise no clear cause for dysphagia seen.  She had an empiric dilation given this had provided her benefit in the past back in 2017.  Unfortunately she states the dilation provided no benefit at this time.  She states her dysphagia has essentially persisted over time with about half of her meals.  She feels solids only can get stuck in her esophagus when she swallows, [place] appears to be the worst.  She denies any liquid dysphagia.  She was seen in the office back in February where a barium swallow was recommended with tablet but she never had it done.  She was previously on Pepcid but has since switched to omeprazole 40 mg daily.  This appears to control her symptoms fairly well however does have some breakthrough occasionally.  She reminds me that she does have a history of osteopenia but denies any history of bone fracture.  Her brother had esophageal cancer diagnosed at age 30.  After her EGD with me she had some intermittent atrial fibrillation.  She was seen by cardiology and is now on Eliquis for atrial fibrillation.  She denies any cardiopulmonary symptoms.  She takes her metoprolol which controls her heart rate.  I have followed her in the past for surveillance colonoscopy for numerous colonic polyps.  She has had a history of suspected ischemic versus infectious colitis in 2017.  She has had issues with both fecal and urine incontinence for which she is required pelvic floor PT. She had a colonoscopy with me in January 2020 at which time she had 5 polyps removed that were a mix of adenomatous and sessile serrated. She had 10 adenomatous/sessile serrated polyps in 2018.   Procedure history: Colonoscopy 04/15/2016 - 10 small polyps -  adenomatous / sessile serrated, diverticulosis, hemorrhoids, no colitis  EGD on 09/26/2015 - 4cm hiatal hernia, otherwise normal esophagus, empirically dilated to 33m Savory. Mild gastritis noted   Colonoscopy 03/15/18 - The perianal and digital rectal examinations were normal. - A 4 to 5 mm polyp was found in the ascending colon. The polyp was flat. The polyp was removed with a cold snare. Resection and retrieval were complete. - Two sessile polyps were found in the transverse colon. The polyps were 3 to 4 mm in size. These polyps were removed with a cold snare. Resection and retrieval were complete. - A 3 mm polyp was found in the recto-sigmoid colon. The polyp was sessile. The polyp was removed with a cold snare. Resection and retrieval were complete. - A 3 mm polyp was found in the rectum. The polyp was sessile. The polyp was removed with a cold snare. Resection and retrieval were complete. - Multiple small-mouthed diverticula were found in the sigmoid colon, transverse colon, ascending colon and cecum, with tortousity in the sigmoid colon. - The exam was otherwise without abnormality. Retroflexed views of rectum not obtained due to small size of the rectum.     EGD 10/17/20: - A 3 cm hiatal hernia was present. - The exam of the esophagus was otherwise normal. No focal stenosis / stricture noted. - A guidewire was placed and the scope was withdrawn. Empiric dilation was performed in the entire  esophagus with a Savary dilator with mild resistance at 17 mm, relook endoscopy showed no mucosal wrents. - Patchy mildly erythematous mucosa was found in the gastric antrum. Biopsies were taken with a cold forceps for Helicobacter pylori testing. - Multiple small sessile polyps were found in the gastric fundus and in the gastric body. One representative polyp biopsied in the fundus with a cold forceps for histology, appeared to be mostly removed. - The exam of the stomach was otherwise normal. -  The duodenal bulb and second portion of the duodenum were normal.  1. Surgical [P], gastric antrum and gastric body - REACTIVE GASTROPATHY - NO H. PYLORI OR INTESTINAL METAPLASIA IDENTIFIED - SEE COMMENT 2. Surgical [P], gastric polyp x1 - HYPERPLASTIC GASTRIC POLYP(S), INFLAMED - NO H. PYLORI, INTESTINAL METAPLASIA OR MALIGNANCY IDENTIFIED   Echocardiogram 12/06/20: EF 73%, grade I DD  Cardiac CT 12/06/20: Calcium score of 24, mild dilation of ascending aorta to be monitored annually  Past Medical History:  Diagnosis Date   Alcohol abuse    Sober 41 years.   Allergic rhinitis    Allergy    Anemia    Anxiety    Blood transfusion without reported diagnosis    Breast cancer ([place]) 12/2020   left   Cancer ([place])    Cataract    bil cataracts removed   Clotting disorder ([place])    DVT- 1996 po knee replacement   Colitis    Complication of anesthesia    vomit x1 while on Morphine per pt   Depression    Diverticulosis    Dysrhythmia    Atrial fibrillation   Fatigue    Frequency of urination    GERD (gastroesophageal reflux disease)    History of colon polyps    History of DVT of lower extremity    POST LEFT TOTAL KNEE  1996   History of hiatal hernia    History of iron deficiency anemia 1996   iron infusion   History of migraine headaches    History of radiation therapy    left breast 03/27/2021-04/23/2021 Dr [name]   History of rib fracture    Hyperlipidemia    Hypertension    IBS (irritable bowel syndrome)    Insomnia    Joint pain    MCI (mild cognitive impairment)    Melanoma ([place]) 2019   left leg    Migraine headache    hx of migraines    OA (osteoarthritis)    RIGHT SHOULDER   Osteopenia    Pneumonia    remote history   PONV (postoperative nausea and vomiting)    Recovering alcoholic ([place])    SINCE [id number]   Right rotator cuff tear    SOB (shortness of breath) on exertion    Substance abuse ([place])    recovering alcoholic   Swallowing  difficulty    Unspecified essential hypertension      Past Surgical History:  Procedure Laterality Date   BREAST BIOPSY Left 01/06/2021   pos   BREAST LUMPECTOMY WITH RADIOACTIVE SEED LOCALIZATION Left 02/27/2021   Procedure: LEFT BREAST LUMPECTOMY WITH RADIOACTIVE SEED LOCALIZATION;  Surgeon: [name], MD;  Location: [facility];  Service: General;  Laterality: Left;   BUNIONECTOMY/  HAMMERTOE CORRECTION  RIGHT FOOT  2011   CATARACT EXTRACTION W/ INTRAOCULAR LENS  IMPLANT, BILATERAL     COLONOSCOPY     DILATION AND CURETTAGE OF UTERUS  1976   EYE SURGERY Bilateral 2016   cataract removal   KNEE  ARTHROSCOPY W/ MENISCECTOMY Bilateral X2  LEFT /    X1  RIGHT   KNEE OPEN LATERAL RELEASE Bilateral    MOHS SURGERY Left 12/15/2017   Melanoma in situ - left calf - [facility]   ORIF ANKLE FRACTURE Right 08/04/2020   Procedure: OPEN REDUCTION INTERNAL FIXATION (ORIF) ANKLE FRACTURE;  Surgeon: Wylene Simmer, MD;  Location: [facility];  Service: Orthopedics;  Laterality: Right;  Mini C-arm, [employer] Small Frag   REPLACEMENT TOTAL KNEE Left 2006   SHOULDER ARTHROSCOPY WITH SUBACROMIAL DECOMPRESSION, ROTATOR CUFF REPAIR AND BICEP TENDON REPAIR Right 05/23/2013   Procedure: RIGHT SHOULDER ARTHROSCOPY EXAM UNDER ANESTHESIA  WITH SUBACROMIAL DECOMPRESSION,DISTAL CLAVICLE RESECTION, SADLABRAL DEBRIDEMENT CHONDROPLASTY, BICEP TENOTOMY ;  Surgeon: Sydnee Cabal, MD;  Location: [facility];  Service: Orthopedics;  Laterality: Right;   TONSILLECTOMY AND ADENOIDECTOMY  AGE 73   TOTAL HIP ARTHROPLASTY Left 05/04/2017   Procedure: LEFT TOTAL HIP ARTHROPLASTY ANTERIOR APPROACH;  Surgeon: Paralee Cancel, MD;  Location: [facility];  Service: Orthopedics;  Laterality: Left;   TOTAL HIP ARTHROPLASTY Right 08/01/2019   Procedure: TOTAL HIP ARTHROPLASTY ANTERIOR APPROACH;  Surgeon: Paralee Cancel, MD;  Location: [facility];  Service: Orthopedics;  Laterality: Right;  84mns   TOTAL KNEE ARTHROPLASTY  Bilateral LEFT  1996/   RIGHT 2004   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      UPPER GASTROINTESTINAL ENDOSCOPY     UPPER GI ENDOSCOPY     VAGINAL HYSTERECTOMY  1976   Family History  Problem Relation Age of Onset   Cancer Mother    Heart disease Mother    Hypertension Mother    Melanoma Mother 870  Obesity Mother    Heart disease Father 679  Hypertension Father    Esophageal cancer Brother    Dementia Paternal Grandfather    Melanoma Niece 149  Colon cancer Neg Hx    Colon polyps Neg Hx    Stomach cancer Neg Hx    Rectal cancer Neg Hx    Social History   Tobacco Use   Smoking status: Former    Packs/day: 0.50    Years: 15.00    Total pack years: 7.50    Types: Cigarettes    Quit date: 05/15/1985    Years since quitting: 36.4   Smokeless tobacco: Never  Vaping Use   Vaping Use: Never used  Substance Use Topics   Alcohol use: No    Comment: RECOVERING ALCOHOLIC--  QUIT [phone]  Drug use: No   Current Outpatient Medications  Medication Sig Dispense Refill   acetaminophen (TYLENOL) 325 MG tablet Take 650 mg by mouth every 6 (six) hours as needed for moderate pain.     Antiseborrheic Products, Misc. (PROMISEB) CREA Uses topical daily as needed on her face     Ascorbic Acid (VITAMIN C) 1000 MG tablet Take 1,000 mg by mouth daily.     atorvastatin (LIPITOR) 40 MG tablet Take 1 tablet (40 mg total) by mouth daily. 90 tablet 3   betamethasone dipropionate 0.05 % lotion APPLY TOPICALLY TO AFFECTED AREA EVERY DAY 60 mL 1   ELIQUIS 5 MG TABS tablet TAKE 1 TABLET BY MOUTH TWICE A DAY 180 tablet 1   famotidine (PEPCID) 20 MG tablet Take 1 tablet (20 mg total) by mouth daily as needed for heartburn or indigestion. (Patient taking differently: Take 20 mg by mouth daily.) 90 tablet 3   FIBER PO Take 1 capsule by mouth daily. Pt takes in  the evening.     HYDROcodone-acetaminophen (NORCO/VICODIN) 5-325 MG tablet 1 tablet as needed     hydrocortisone cream 1 % Apply 1 application  topically daily as needed for itching.     L-Methylfolate-B12-B6-B2 (CEREFOLIN) 07-17-48-5 MG TABS Take 1 tablet by mouth daily. 90 tablet 1   losartan (COZAAR) 100 MG tablet TAKE 1 TABLET BY MOUTH EVERY DAY 90 tablet 2   metFORMIN (GLUCOPHAGE) 500 MG tablet Take 1 tablet (500 mg total) by mouth 2 (two) times daily with a meal. 60 tablet 0   metoprolol tartrate (LOPRESSOR) 25 MG tablet TAKE 1/2 TABLET BY MOUTH TWICE A DAY 90 tablet 1   omeprazole (PRILOSEC) 40 MG capsule TAKE 1 CAPSULE (40 MG TOTAL) BY MOUTH DAILY. 90 capsule 1   rizatriptan (MAXALT) 10 MG tablet Take 1 tablet (10 mg total) by mouth as needed for migraine. May repeat in 2 hours if needed 10 tablet 2   sulfamethoxazole-trimethoprim (BACTRIM,SEPTRA) 400-80 MG tablet Take 1 tablet by mouth daily. For urethritis.     tamoxifen (NOLVADEX) 10 MG tablet TAKE 0.5 TABLETS BY MOUTH 2 TIMES DAILY. 90 tablet 3   traZODone (DESYREL) 50 MG tablet TAKE 1 TABLET BY MOUTH AT BEDTIME 90 tablet 1   venlafaxine XR (EFFEXOR-XR) 37.5 MG 24 hr capsule Take 1 capsule (37.5 mg total) by mouth daily with breakfast. 90 capsule 0   Vitamin D, Ergocalciferol, (DRISDOL) 1.25 MG (50000 UNIT) CAPS capsule Take 1 capsule (50,000 Units total) by mouth every 7 (seven) days. 4 capsule 0   No current facility-administered medications for this visit.   Allergies  Allergen Reactions   Otezla [Apremilast] Anaphylaxis    Suicidal ideation   Penicillins Anaphylaxis    Has patient had a PCN reaction causing immediate rash, facial/tongue/throat swelling, SOB or lightheadedness with hypotension: Yes Has patient had a PCN reaction causing severe rash involving mucus membranes or skin necrosis: Yes Has patient had a PCN reaction that required hospitalization: No Has patient had a PCN reaction occurring within the last 10 year No If all of the above answers are "NO", then may proceed with Cephalosporin use.    Erythromycin Other (See Comments)    SEVERE STOMACH CRAMPS    Morphine And Related Nausea And Vomiting   Nsaids Other (See Comments)    SEVERE STOMACH CRAMPS, MOUTH SORES **Able to tolerate Tylenol   Remicade [Infliximab] Other (See Comments)    Shut down immune system-BP high   Tolmetin     SEVERE STOMACH CRAMPS   Nickel Rash    Including snaps on hospital gowns      Review of Systems: All systems reviewed and negative except where noted in HPI.   Lab Results  Component Value Date   WBC 3.7 06/11/2021   HGB 12.2 06/11/2021   HCT 36.3 06/11/2021   MCV 98 (H) 06/11/2021   PLT 250 06/11/2021    Lab Results  Component Value Date   CREATININE 0.70 06/11/2021   BUN 11 06/11/2021   NA 141 06/11/2021   K 4.4 06/11/2021   CL 104 06/11/2021   CO2 25 06/11/2021   Lab Results  Component Value Date   CREATININE 0.70 06/11/2021   BUN 11 06/11/2021   NA 141 06/11/2021   K 4.4 06/11/2021   CL 104 06/11/2021   CO2 25 06/11/2021     Physical Exam: BP (!) 150/84   Pulse 61   Ht 5' 6.5" (1.689 m)   Wt 216 lb (98 kg)  BMI 34.34 kg/m  Constitutional: Pleasant,well-developed, female in no acute distress. Skin: Skin is warm and dry. No rashes noted. Psychiatric: Normal mood and affect. Behavior is normal.   ASSESSMENT: 76 y.o. female here for assessment of the following  1. Dysphagia, unspecified type   2. Gastroesophageal reflux disease, unspecified whether esophagitis present   3. History of colon polyps   4. Anticoagulated    Reviewed issues as outlined above.  Ongoing intermittent dysphagia.  Interestingly responded to empiric dilation several years ago, her last EGD with me the dilation did not do anything and her symptoms persist.  Perhaps she has some underlying dysmotility.  Discussed options to evaluate this, barium swallow versus manometry as next step.  We will proceed with barium swallow with tablet study to see if we can identify any source of dysphagia.  Continue to chew food well.    Otherwise spent some time  discussing her reflux.  Omeprazole 40 mg daily is doing a fair job at controlling symptoms but she still has some breakthrough periodically.  She will add some Pepcid as needed and Gaviscon as needed as well.  Would prefer to avoid higher dosing of PPI for her given her history of osteopenia.  Discussed long-term risks of chronic PPI use with her and she understands this but feels the benefits of the medicine outweigh the risks.  Otherwise she is due for surveillance colonoscopy.  We discussed if she wanted to pursue this at her age and other medical problems.  I do think that she would benefit from 1 more exam given the volume of polyps she is having her last 2 colonoscopies.  Her A-fib appears rate controlled and she is on Eliquis.  We will reach out to her cardiologist to see if she can hold Eliquis for 2 days prior to her colonoscopy.  Discussed risk benefits of the exam and anesthesia with her and she wants to proceed.   PLAN: - schedule barium swallow with tablet - continue omeprazole, discussed risks of chronic PPI use - use pepcid PRN for breatkthrough or gaviscon PRN - schedule colonoscopy at [facility] - Sutab prep - will need approval to hold Eliquis for 2 days prior to the procedure   Jolly Mango, MD [facility]

## 2021-10-15 NOTE — Telephone Encounter (Signed)
Pt on Eliquis, not Plavix.  Will route to PharmD for rec's re: holding anticoagulation. Richardson Dopp, PA-C    10/15/2021 6:30 PM

## 2021-10-16 NOTE — Progress Notes (Signed)
Chief Complaint:   OBESITY Tonya Bartlett is here to discuss her progress with her obesity treatment plan along with follow-up of her obesity related diagnoses. Tonya Bartlett is on the Category 2 Plan and states she is following her eating plan approximately 75% of the time. Tonya Bartlett states she is doing physical therapy for 5 times per week.  Today's visit was #: 7 Starting weight: 222 lbs Starting date: 06/11/2021 Today's weight: 214 lbs Today's date: 10/08/2021 Total lbs lost to date: 8 Total lbs lost since last in-office visit: 5  Interim History: Tonya Bartlett continues to work on [employer] loss and has done well in the last 3 weeks.  She will be traveling soon and will be eating out more, but she is making strategies.  She is doing physical therapy and notes decreased pain.  Subjective:   1. Pre-diabetes Tonya Bartlett is doing well with the increase in metformin.  She has some GI upset, but not enough to decrease her medications.  2. Vitamin D deficiency Tonya Bartlett is stable on vitamin D, with no muscle weakness noted.  She requests a refill today.  Assessment/Plan:   1. Pre-diabetes We will refill metformin for 1 month. Tonya Bartlett will continue to work on weight loss, exercise, and decreasing simple carbohydrates to help decrease the risk of diabetes.   - metFORMIN (GLUCOPHAGE) 500 MG tablet; Take 1 tablet (500 mg total) by mouth 2 (two) times daily with a meal.  Dispense: 60 tablet; Refill: 0  2. Vitamin D deficiency We will refill prescription Vitamin D for 1 month. Tonya Bartlett will follow-up for routine testing of Vitamin D, at least 2-3 times per year to avoid over-replacement.  - Vitamin D, Ergocalciferol, (DRISDOL) 1.25 MG (50000 UNIT) CAPS capsule; Take 1 capsule (50,000 Units total) by mouth every 7 (seven) days.  Dispense: 4 capsule; Refill: 0  3. Obesity, Current BMI 34.5 Tonya Bartlett is currently in the action stage of change. As such, her goal is to continue with weight loss efforts. She has agreed to the  Category 2 Plan.   Exercise goals: As is.   Behavioral modification strategies: increasing lean protein intake, increasing water intake, and travel eating strategies.  [name] has agreed to follow-up with our clinic in 4 weeks. She was informed of the importance of frequent follow-up visits to maximize her success with intensive lifestyle modifications for her multiple health conditions.   Objective:   Blood pressure 137/74, pulse 82, temperature 97.9 F (36.6 C), height '5\' 6"'$  (1.676 m), weight 214 lb (97.1 kg), SpO2 92 %. Body mass index is 34.54 kg/m.  General: Cooperative, alert, well developed, in no acute distress. HEENT: Conjunctivae and lids unremarkable. Cardiovascular: Regular rhythm.  Lungs: Normal work of breathing. Neurologic: No focal deficits.   Lab Results  Component Value Date   CREATININE 0.70 06/11/2021   BUN 11 06/11/2021   NA 141 06/11/2021   K 4.4 06/11/2021   CL 104 06/11/2021   CO2 25 06/11/2021   Lab Results  Component Value Date   ALT 17 06/11/2021   AST 20 06/11/2021   ALKPHOS 61 06/11/2021   BILITOT 0.6 06/11/2021   Lab Results  Component Value Date   HGBA1C 5.3 06/11/2021   HGBA1C 5.4 04/24/2021   HGBA1C 5.3 11/05/2016   Lab Results  Component Value Date   INSULIN 9.6 06/11/2021   Lab Results  Component Value Date   TSH 2.880 06/11/2021   Lab Results  Component Value Date   CHOL 166 06/11/2021   HDL 61  06/11/2021   LDLCALC 90 06/11/2021   LDLDIRECT 187.9 10/08/2009   TRIG 82 06/11/2021   CHOLHDL 2.7 06/11/2021   Lab Results  Component Value Date   VD25OH 34.3 06/11/2021   Lab Results  Component Value Date   WBC 3.7 06/11/2021   HGB 12.2 06/11/2021   HCT 36.3 06/11/2021   MCV 98 (H) 06/11/2021   PLT 250 06/11/2021   No results found for: "IRON", "TIBC", "FERRITIN"  Obesity Behavioral Intervention:   Approximately 15 minutes were spent on the discussion below.  ASK: We discussed the diagnosis of obesity with  Tonya Bartlett today and Tonya Bartlett agreed to give [name] permission to discuss obesity behavioral modification therapy today.  ASSESS: Tonya Bartlett has the diagnosis of obesity and her BMI today is 34.5. Tonya Bartlett is in the action stage of change.   ADVISE: Tonya Bartlett was educated on the multiple health risks of obesity as well as the benefit of weight loss to improve her health. She was advised of the need for long term treatment and the importance of lifestyle modifications to improve her current health and to decrease her risk of future health problems.  AGREE: Multiple dietary modification options and treatment options were discussed and Tonya Bartlett agreed to follow the recommendations documented in the above note.  ARRANGE: Tonya Bartlett was educated on the importance of frequent visits to treat obesity as outlined per CMS and USPSTF guidelines and agreed to schedule her next follow up appointment today.  Attestation Statements:   Reviewed by clinician on day of visit: allergies, medications, problem list, medical history, surgical history, family history, social history, and previous encounter notes.   I, Fatema Rabe, am acting as transcriptionist for [name], MD.  I have reviewed the above documentation for accuracy and completeness, and I agree with the above. -  [name], MD

## 2021-10-16 NOTE — Telephone Encounter (Signed)
Patient with diagnosis of PAF on Eliquis for anticoagulation.    Procedure: colonoscopy Date of procedure: 11/18/21   CHA2DS2-VASc Score = 5  } This indicates a 7.2% annual risk of stroke. The patient's score is based upon: CHF History: 0 HTN History: 1 Diabetes History: 0 Stroke History: 0 Vascular Disease History: 1 Age Score: 2 Gender Score: 1  CrCl 79 mL/min using adjusted body weight Platelet count 250K  Per office protocol, patient can hold Eliquis for 1-2 days prior to procedure.    **This guidance is not considered finalized until pre-operative APP has relayed final recommendations.**

## 2021-10-17 DIAGNOSIS — M25562 Pain in left knee: Secondary | ICD-10-CM | POA: Diagnosis not present

## 2021-10-19 DIAGNOSIS — Z23 Encounter for immunization: Secondary | ICD-10-CM | POA: Diagnosis not present

## 2021-10-21 ENCOUNTER — Telehealth: Payer: Self-pay | Admitting: *Deleted

## 2021-10-21 NOTE — Telephone Encounter (Signed)
Pt agreeable to plan of care for tele pre op appt 11/07/21 @ 10:20. Med rec and consent are done.   Pt states she thought she had an appt for Dr. Gwenlyn Found a couple of months ago,, maybe labs. I reviewed the chart and informed the pt that she was supposed to have lft/lipids 08/07/21. I did advise the pt she can sill come in for her labs, the orders are in the computer. Pt thanked me for the help today.      Patient Consent for Virtual Visit        Tonya Bartlett has provided verbal consent on 10/21/2021 for a virtual visit (video or telephone).   CONSENT FOR VIRTUAL VISIT FOR:  Tonya Bartlett  By participating in this virtual visit I agree to the following:  I hereby voluntarily request, consent and authorize [facility] and its employed or contracted physicians, physician assistants, nurse practitioners or other licensed health care professionals (the Practitioner), to provide me with telemedicine health care services (the "Services") as deemed necessary by the treating Practitioner. I acknowledge and consent to receive the Services by the Practitioner via telemedicine. I understand that the telemedicine visit will involve communicating with the Practitioner through live audiovisual communication technology and the disclosure of certain medical information by electronic transmission. I acknowledge that I have been given the opportunity to request an in-person assessment or other available alternative prior to the telemedicine visit and am voluntarily participating in the telemedicine visit.  I understand that I have the right to withhold or withdraw my consent to the use of telemedicine in the course of my care at any time, without affecting my right to future care or treatment, and that the Practitioner or I may terminate the telemedicine visit at any time. I understand that I have the right to inspect all information obtained and/or recorded in the course of the telemedicine visit and may  receive copies of available information for a reasonable fee.  I understand that some of the potential risks of receiving the Services via telemedicine include:  Delay or interruption in medical evaluation due to technological equipment failure or disruption; Information transmitted may not be sufficient (e.g. poor resolution of images) to allow for appropriate medical decision making by the Practitioner; and/or  In rare instances, security protocols could fail, causing a breach of personal health information.  Furthermore, I acknowledge that it is my responsibility to provide information about my medical history, conditions and care that is complete and accurate to the best of my ability. I acknowledge that Practitioner's advice, recommendations, and/or decision may be based on factors not within their control, such as incomplete or inaccurate data provided by me or distortions of diagnostic images or specimens that may result from electronic transmissions. I understand that the practice of medicine is not an exact science and that Practitioner makes no warranties or guarantees regarding treatment outcomes. I acknowledge that a copy of this consent can be made available to me via my patient portal ([facility]), or I can request a printed copy by calling the office of [facility].    I understand that my insurance will be billed for this visit.   I have read or had this consent read to me. I understand the contents of this consent, which adequately explains the benefits and risks of the Services being provided via telemedicine.  I have been provided ample opportunity to ask questions regarding this consent and the Services and have had  my questions answered to my satisfaction. I give my informed consent for the services to be provided through the use of telemedicine in my medical care

## 2021-10-21 NOTE — Telephone Encounter (Signed)
Pt agreeable to plan of care for tele pre op appt 11/07/21 @ 10:20. Med rec and consent are done.    Pt states she thought she had an appt for Dr. Gwenlyn Found a couple of months ago,, maybe labs. I reviewed the chart and informed the pt that she was supposed to have lft/lipids 08/07/21. I did advise the pt she can sill come in for her labs, the orders are in the computer. Pt thanked me for the help today.

## 2021-10-21 NOTE — Telephone Encounter (Signed)
Primary Cardiologist:Jonathan Gwenlyn Found, MD   Preoperative team, please contact this patient and set up a phone call appointment for further preoperative risk assessment. Please obtain consent and complete medication review. Thank you for your help.   I confirm that guidance regarding antiplatelet and oral anticoagulation therapy has been completed and, if necessary, noted below.   Emmaline Life, NP-C    10/21/2021, 9:55 AM [facility] [address], [address] Office [phone] Fax [phone]

## 2021-10-28 ENCOUNTER — Telehealth: Payer: Self-pay | Admitting: Pharmacist

## 2021-10-28 NOTE — Progress Notes (Signed)
Chronic Care Management Pharmacy Assistant   Name: Tonya Bartlett  MRN: [id number] DOB: 11-05-1945  Reason for Encounter: Disease State   Conditions to be addressed/monitored: General Assessment  Recent office visits:  None  Recent consult visits:  10/17/21 Leone Payor ([facility]) - Patient presented for Pain of left knee joint. No medication changes.  10/13/21 Burns, Margo Aye  PTA ([facility]) - Patient presented for Pain of left knee joint. No medication changes.  10/09/21 Armbruster, [name], MD [facility]) - Patient presented for Dysphagia unspecified and other concerns. Prescribed Sodium Sulfate Mag.   10/08/21 [name] D, MD ([facility]) - Patient presented for Pre-Diabetes and other concerns.  10/07/21 Suzzanne Cloud, NP (Neurology) - Patient presented for MCI. No medication changes.  10/06/21 Burns, Margo Aye  PTA ([facility]) - Patient presented for Pain of left knee joint. No medication changes.  10/01/21 Neomia Glass, MD([facility]) - Patient presented for Pain of left knee joint. No medication changes.  09/25/21 Neomia Glass, MD([facility]) - Patient presented for Pain of left knee joint. No medication changes. Recommended Ice and anti- inflammatories.  09/17/21 Starlyn Skeans, MD ([facility]) - Patient presented for Vitamin D Deficiency and other concerns. No medication changes.  08/27/21 Starlyn Skeans, MD ([facility]) - Patient presented for Vitamin D Deficiency and other concerns. Increased Metformin.  08/27/21 [name] MD [facility]) - Patient presented for Major depressive disorder. No other visit details available.  08/07/21 [name], MD (Cariology) - Patient presented for Medication Management and other concerns. Increased Atorvastatin 40 mg.   07/29/21 Starlyn Skeans, MD ([facility]) - Patient presented for Vitamin D Deficiency and other concerns. No medication  changes.  07/22/21 Gardenia Phlegm, NP (Oncology) - Patient presented for Ductal Carcinoma in Situ of left breast and other concerns. No medication changes.  07/17/21 Starlyn Skeans, MD ([facility]) - Patient presented for Vitamin D Deficiency and other concerns. No medication changes.  Hospital visits:  None in previous 6 months  Medications: Outpatient Encounter Medications as of 10/28/2021  Medication Sig   acetaminophen (TYLENOL) 325 MG tablet Take 650 mg by mouth every 6 (six) hours as needed for moderate pain.   Antiseborrheic Products, Misc. (PROMISEB) CREA Uses topical daily as needed on her face   Ascorbic Acid (VITAMIN C) 1000 MG tablet Take 1,000 mg by mouth daily.   atorvastatin (LIPITOR) 40 MG tablet Take 1 tablet (40 mg total) by mouth daily.   betamethasone dipropionate 0.05 % lotion APPLY TOPICALLY TO AFFECTED AREA EVERY DAY   ELIQUIS 5 MG TABS tablet TAKE 1 TABLET BY MOUTH TWICE A DAY   famotidine (PEPCID) 20 MG tablet Take 1 tablet (20 mg total) by mouth daily as needed for heartburn or indigestion. (Patient taking differently: Take 20 mg by mouth daily.)   FIBER PO Take 1 capsule by mouth daily. Pt takes in the evening.   HYDROcodone-acetaminophen (NORCO/VICODIN) 5-325 MG tablet 1 tablet as needed   hydrocortisone cream 1 % Apply 1 application topically daily as needed for itching.   L-Methylfolate-B12-B6-B2 (CEREFOLIN) 07-17-48-5 MG TABS Take 1 tablet by mouth daily.   losartan (COZAAR) 100 MG tablet TAKE 1 TABLET BY MOUTH EVERY DAY   metFORMIN (GLUCOPHAGE) 500 MG tablet Take 1 tablet (500 mg total) by mouth 2 (two) times daily with a meal.   metoprolol tartrate (LOPRESSOR) 25 MG tablet TAKE 1/2 TABLET BY  MOUTH TWICE A DAY   omeprazole (PRILOSEC) 40 MG capsule TAKE 1 CAPSULE (40 MG TOTAL) BY MOUTH DAILY.   rizatriptan (MAXALT) 10 MG tablet Take 1 tablet (10 mg total) by mouth as needed for migraine. May repeat in 2 hours if needed   Sodium Sulfate-Mag  Sulfate-KCl (SUTAB) [phone] MG TABS Take 1 kit by mouth as directed.   sulfamethoxazole-trimethoprim (BACTRIM,SEPTRA) 400-80 MG tablet Take 1 tablet by mouth daily. For urethritis.   tamoxifen (NOLVADEX) 10 MG tablet TAKE 0.5 TABLETS BY MOUTH 2 TIMES DAILY.   traZODone (DESYREL) 50 MG tablet TAKE 1 TABLET BY MOUTH AT BEDTIME   venlafaxine XR (EFFEXOR-XR) 37.5 MG 24 hr capsule Take 1 capsule (37.5 mg total) by mouth daily with breakfast.   Vitamin D, Ergocalciferol, (DRISDOL) 1.25 MG (50000 UNIT) CAPS capsule Take 1 capsule (50,000 Units total) by mouth every 7 (seven) days.   No facility-administered encounter medications on file as of 10/28/2021.   [facility] for General Review Call   Adherence Review:  Does the Clinical Pharmacist Assistant have access to adherence rates? Yes Adherence rates for STAR metric medications  Losartan 100 mg - Last [facility] 8/3//23 [facility] Losartan 100 mg - Last filled 06/25/21 [facility] Metformin 500 mg - Last filled 10/08/21 [facility] Metformin 500 mg - Last filled 08/27/21 [facility] Atorvastatin 40 mg - Last filled 08/23/21 [facility] Atorvastatin 20 mg - Last filled 08/07/21 [facility]    Disease State Questions:  Able to connect with Patient? No   Care Gaps: COVID Booster - Overdue Flu Vaccine - Overdue Colonoscopy - Postponed BP- 150/84 10/09/21 AWV- 3/23 Lab Results  Component Value Date   HGBA1C 5.3 06/11/2021    Star Rating Drugs: Losartan 100 mg - Last filled 8/3//23 [facility] Metformin 500 mg - Last filled 10/08/21 [facility] Atorvastatin 40 mg - Last filled 08/23/21 90 DS at [facility] Pharmacist Assistant [phone]

## 2021-10-30 ENCOUNTER — Other Ambulatory Visit (INDEPENDENT_AMBULATORY_CARE_PROVIDER_SITE_OTHER): Payer: Self-pay | Admitting: Family Medicine

## 2021-10-30 DIAGNOSIS — R7303 Prediabetes: Secondary | ICD-10-CM

## 2021-11-04 ENCOUNTER — Ambulatory Visit (INDEPENDENT_AMBULATORY_CARE_PROVIDER_SITE_OTHER): Payer: Medicare Other | Admitting: Family Medicine

## 2021-11-05 ENCOUNTER — Encounter (INDEPENDENT_AMBULATORY_CARE_PROVIDER_SITE_OTHER): Payer: Self-pay | Admitting: Internal Medicine

## 2021-11-05 ENCOUNTER — Ambulatory Visit (INDEPENDENT_AMBULATORY_CARE_PROVIDER_SITE_OTHER): Payer: Medicare Other | Admitting: Internal Medicine

## 2021-11-05 VITALS — BP 144/78 | HR 64 | Temp 97.7°F | Ht 66.0 in | Wt 212.0 lb

## 2021-11-05 DIAGNOSIS — R7303 Prediabetes: Secondary | ICD-10-CM | POA: Insufficient documentation

## 2021-11-05 DIAGNOSIS — E669 Obesity, unspecified: Secondary | ICD-10-CM

## 2021-11-05 DIAGNOSIS — E559 Vitamin D deficiency, unspecified: Secondary | ICD-10-CM | POA: Diagnosis not present

## 2021-11-05 DIAGNOSIS — I10 Essential (primary) hypertension: Secondary | ICD-10-CM | POA: Diagnosis not present

## 2021-11-05 DIAGNOSIS — Z6834 Body mass index (BMI) 34.0-34.9, adult: Secondary | ICD-10-CM | POA: Diagnosis not present

## 2021-11-05 DIAGNOSIS — M25562 Pain in left knee: Secondary | ICD-10-CM | POA: Diagnosis not present

## 2021-11-05 MED ORDER — METFORMIN HCL 500 MG PO TABS: 500.0000 mg | ORAL_TABLET | Freq: Two times a day (BID) | ORAL | 0 refills | Status: DC

## 2021-11-05 MED ORDER — VITAMIN D (ERGOCALCIFEROL) 1.25 MG (50000 UNIT) PO CAPS: 50000.0000 [IU] | ORAL_CAPSULE | ORAL | 0 refills | Status: DC

## 2021-11-06 ENCOUNTER — Ambulatory Visit (HOSPITAL_COMMUNITY)
Admission: RE | Admit: 2021-11-06 | Discharge: 2021-11-06 | Disposition: A | Discharge status: Home / Self Care | Payer: Medicare Other | Source: Ambulatory Visit | Attending: Gastroenterology | Admitting: Gastroenterology

## 2021-11-06 DIAGNOSIS — K219 Gastro-esophageal reflux disease without esophagitis: Secondary | ICD-10-CM | POA: Diagnosis not present

## 2021-11-06 DIAGNOSIS — R1310 Dysphagia, unspecified: Secondary | ICD-10-CM | POA: Diagnosis not present

## 2021-11-06 DIAGNOSIS — K449 Diaphragmatic hernia without obstruction or gangrene: Secondary | ICD-10-CM | POA: Diagnosis not present

## 2021-11-06 DIAGNOSIS — K224 Dyskinesia of esophagus: Secondary | ICD-10-CM | POA: Diagnosis not present

## 2021-11-07 ENCOUNTER — Ambulatory Visit: Payer: Medicare Other | Attending: Cardiology | Admitting: Physician Assistant

## 2021-11-07 DIAGNOSIS — Z01810 Encounter for preprocedural cardiovascular examination: Secondary | ICD-10-CM | POA: Diagnosis not present

## 2021-11-07 NOTE — Progress Notes (Signed)
Virtual Visit via Telephone Note   Because of Tonya Bartlett's co-morbid illnesses, she is at least at moderate risk for complications without adequate follow up.  This format is felt to be most appropriate for this patient at this time.  The patient did not have access to video technology/had technical difficulties with video requiring transitioning to audio format only (telephone).  All issues noted in this document were discussed and addressed.  No physical exam could be performed with this format.  Please refer to the patient's chart for her consent to telehealth for [facility].  Evaluation Performed:  Preoperative cardiovascular risk assessment _____________   Date:  11/07/2021   Patient ID:  Tonya Bartlett, DOB 1945-12-26, MRN [id number] Patient Location:  Home Provider location:   Office  Primary Care Provider:  [name], NP Primary Cardiologist:  Tonya Burow, MD  Chief Complaint / Patient Profile   76 y.o. y/o female with a h/o anxiety, Afib, DVT, hypertension who is pending colonoscopy and presents today for telephonic preoperative cardiovascular risk assessment.  Past Medical History    Past Medical History:  Diagnosis Date   Alcohol abuse    Sober 41 years.   Allergic rhinitis    Allergy    Anemia    Anxiety    Blood transfusion without reported diagnosis    Breast cancer ([place]) 12/2020   left   Cancer ([place])    Cataract    bil cataracts removed   Clotting disorder ([place])    DVT- 1996 po knee replacement   Colitis    Complication of anesthesia    vomit x1 while on Morphine per pt   Depression    Diverticulosis    Dysrhythmia    Atrial fibrillation   Fatigue    Frequency of urination    GERD (gastroesophageal reflux disease)    History of colon polyps    History of DVT of lower extremity    POST LEFT TOTAL KNEE  1996   History of hiatal hernia    History of iron deficiency anemia 1996   iron infusion   History of migraine  headaches    History of radiation therapy    left breast 03/27/2021-04/23/2021 Dr [name]   History of rib fracture    Hyperlipidemia    Hypertension    IBS (irritable bowel syndrome)    Insomnia    Joint pain    MCI (mild cognitive impairment)    Melanoma ([place]) 2019   left leg    Migraine headache    hx of migraines    OA (osteoarthritis)    RIGHT SHOULDER   Osteopenia    Pneumonia    remote history   PONV (postoperative nausea and vomiting)    Recovering alcoholic ([place])    SINCE [phone]   Right rotator cuff tear    SOB (shortness of breath) on exertion    Substance abuse ([place])    recovering alcoholic   Swallowing difficulty    Unspecified essential hypertension    Past Surgical History:  Procedure Laterality Date   BREAST BIOPSY Left 01/06/2021   pos   BREAST LUMPECTOMY WITH RADIOACTIVE SEED LOCALIZATION Left 02/27/2021   Procedure: LEFT BREAST LUMPECTOMY WITH RADIOACTIVE SEED LOCALIZATION;  Surgeon: [name], MD;  Location: [facility];  Service: General;  Laterality: Left;   BUNIONECTOMY/  HAMMERTOE CORRECTION  RIGHT FOOT  2011   CATARACT EXTRACTION W/ INTRAOCULAR LENS  IMPLANT, BILATERAL     COLONOSCOPY  DILATION AND CURETTAGE OF UTERUS  1976   EYE SURGERY Bilateral 2016   cataract removal   KNEE ARTHROSCOPY W/ MENISCECTOMY Bilateral X2  LEFT /    X1  RIGHT   KNEE OPEN LATERAL RELEASE Bilateral    MOHS SURGERY Left 12/15/2017   Melanoma in situ - left calf - [facility]   ORIF ANKLE FRACTURE Right 08/04/2020   Procedure: OPEN REDUCTION INTERNAL FIXATION (ORIF) ANKLE FRACTURE;  Surgeon: Wylene Simmer, MD;  Location: [facility];  Service: Orthopedics;  Laterality: Right;  Mini C-arm, [employer] Small Frag   REPLACEMENT TOTAL KNEE Left 2006   SHOULDER ARTHROSCOPY WITH SUBACROMIAL DECOMPRESSION, ROTATOR CUFF REPAIR AND BICEP TENDON REPAIR Right 05/23/2013   Procedure: RIGHT SHOULDER ARTHROSCOPY EXAM UNDER ANESTHESIA  WITH SUBACROMIAL DECOMPRESSION,DISTAL  CLAVICLE RESECTION, SADLABRAL DEBRIDEMENT CHONDROPLASTY, BICEP TENOTOMY ;  Surgeon: Sydnee Cabal, MD;  Location: [facility];  Service: Orthopedics;  Laterality: Right;   TONSILLECTOMY AND ADENOIDECTOMY  AGE 19   TOTAL HIP ARTHROPLASTY Left 05/04/2017   Procedure: LEFT TOTAL HIP ARTHROPLASTY ANTERIOR APPROACH;  Surgeon: Paralee Cancel, MD;  Location: [facility];  Service: Orthopedics;  Laterality: Left;   TOTAL HIP ARTHROPLASTY Right 08/01/2019   Procedure: TOTAL HIP ARTHROPLASTY ANTERIOR APPROACH;  Surgeon: Paralee Cancel, MD;  Location: [facility];  Service: Orthopedics;  Laterality: Right;  65mns   TOTAL KNEE ARTHROPLASTY Bilateral LEFT  1996/   RIGHT 2004   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      UPPER GASTROINTESTINAL ENDOSCOPY     UPPER GI ENDOSCOPY     VAGINAL HYSTERECTOMY  1976    Allergies  Allergies  Allergen Reactions   Otezla [Apremilast] Anaphylaxis    Suicidal ideation   Penicillins Anaphylaxis    Has patient had a PCN reaction causing immediate rash, facial/tongue/throat swelling, SOB or lightheadedness with hypotension: Yes Has patient had a PCN reaction causing severe rash involving mucus membranes or skin necrosis: Yes Has patient had a PCN reaction that required hospitalization: No Has patient had a PCN reaction occurring within the last 10 year No If all of the above answers are "NO", then may proceed with Cephalosporin use.    Erythromycin Other (See Comments)    SEVERE STOMACH CRAMPS   Morphine And Related Nausea And Vomiting   Nsaids Other (See Comments)    SEVERE STOMACH CRAMPS, MOUTH SORES **Able to tolerate Tylenol   Remicade [Infliximab] Other (See Comments)    Shut down immune system-BP high   Tolmetin     SEVERE STOMACH CRAMPS   Nickel Rash    Including snaps on hospital gowns     History of Present Illness    Tonya Bartlett a 76y.o. female who presents via audio/video conferencing for a telehealth visit today.  Pt was last seen in  cardiology clinic on 08/07/21 by Dr. BGwenlyn Bartlett  At that time Tonya Bartlett doing well.  The patient is now pending procedure as outlined above. Since her last visit, she has been doing well without any chest pain or SOB. She had her knee replaced x 2 and has had issues with it. She swims and walks her pups. She plans to start using arm weights. She tells me she is paying way too much for Eliquis and would like to switch to Coumadin if possible. She plans to bring this up at her next appointment. She has some dizziness when she first stands up which has been happening a while. She sees an ENT  for this.    She can hold her Eliquis for 1-2 days prior to her procedure. Please restart when medically safe to do so.    Home Medications    Prior to Admission medications   Medication Sig Start Date End Date Taking? Authorizing Provider  acetaminophen (TYLENOL) 325 MG tablet Take 650 mg by mouth every 6 (six) hours as needed for moderate pain.    [provider]  Antiseborrheic Products, [place]. (PROMISEB) CREA Uses topical daily as needed on her face    [provider]  Ascorbic Acid (VITAMIN C) 1000 MG tablet Take 1,000 mg by mouth daily.    [provider]  atorvastatin (LIPITOR) 40 MG tablet Take 1 tablet (40 mg total) by mouth daily. 08/07/21   [name], MD  betamethasone dipropionate 0.05 % lotion APPLY TOPICALLY TO AFFECTED AREA EVERY DAY 06/25/21   Nafziger, Tommi Rumps, NP  ELIQUIS 5 MG TABS tablet TAKE 1 TABLET BY MOUTH TWICE A DAY 06/02/21   [name], MD  famotidine (PEPCID) 20 MG tablet Take 1 tablet (20 mg total) by mouth daily as needed for heartburn or indigestion. Patient taking differently: Take 20 mg by mouth daily. 07/04/20   Armbruster, [name], MD  FIBER PO Take 1 capsule by mouth daily. Pt takes in the evening.    [provider]  HYDROcodone-acetaminophen (NORCO/VICODIN) 5-325 MG tablet 1 tablet as needed 03/07/21   [provider]   hydrocortisone cream 1 % Apply 1 application topically daily as needed for itching.    [provider]  L-Methylfolate-B12-B6-B2 (CEREFOLIN) 07-17-48-5 MG TABS Take 1 tablet by mouth daily. 10/09/21   Suzzanne Cloud, NP  losartan (COZAAR) 100 MG tablet TAKE 1 TABLET BY MOUTH EVERY DAY 09/18/21   Nafziger, Tommi Rumps, NP  metFORMIN (GLUCOPHAGE) 500 MG tablet Take 1 tablet (500 mg total) by mouth 2 (two) times daily with a meal. 11/05/21   Thomes Dinning, MD  metoprolol tartrate (LOPRESSOR) 25 MG tablet TAKE 1/2 TABLET BY MOUTH TWICE A DAY 07/18/21   Sherran Needs, NP  omeprazole (PRILOSEC) 40 MG capsule TAKE 1 CAPSULE (40 MG TOTAL) BY MOUTH DAILY. 09/29/21   Zehr, Janett Billow D, PA-C  rizatriptan (MAXALT) 10 MG tablet Take 1 tablet (10 mg total) by mouth as needed for migraine. May repeat in 2 hours if needed 03/06/21   Tonya Peng, NP  Sodium Sulfate-Mag Sulfate-KCl (SUTAB) [phone] MG TABS Take 1 kit by mouth as directed. 10/09/21   Armbruster, [name], MD  sulfamethoxazole-trimethoprim (BACTRIM,SEPTRA) 400-80 MG tablet Take 1 tablet by mouth daily. For urethritis.    [name], MD  tamoxifen (NOLVADEX) 10 MG tablet TAKE 0.5 TABLETS BY MOUTH 2 TIMES DAILY. 06/10/21   [name], MD  traZODone (DESYREL) 50 MG tablet TAKE 1 TABLET BY MOUTH AT BEDTIME 06/10/21   Nafziger, Tommi Rumps, NP  venlafaxine XR (EFFEXOR-XR) 37.5 MG 24 hr capsule Take 1 capsule (37.5 mg total) by mouth daily with breakfast. 08/27/21   [name], MD  Vitamin D, Ergocalciferol, (DRISDOL) 1.25 MG (50000 UNIT) CAPS capsule Take 1 capsule (50,000 Units total) by mouth every 7 (seven) days. 11/05/21   Thomes Dinning, MD    Physical Exam    Vital Signs:  Tonya Bartlett does not have vital signs available for review today.  Given telephonic nature of communication, physical exam is limited. AAOx3. NAD. Normal affect.  Speech and respirations are unlabored.  Accessory Clinical Findings    None  Assessment &  Plan     1.  Preoperative Cardiovascular Risk Assessment:  Tonya Bartlett's perioperative risk of a major cardiac event is 0.4% according to the Revised Cardiac Risk Index (RCRI).  Therefore, she is at low risk for perioperative complications.   Her functional capacity is good at 5.99 METs according to the [place] Activity Status Index (DASI). Recommendations: According to ACC/AHA guidelines, no further cardiovascular testing needed.  The patient may proceed to surgery at acceptable risk.   Antiplatelet and/or Anticoagulation Recommendations:  Eliquis (Apixaban) can be held for 1-2 days prior to surgery.  Please resume post op when felt to be safe.     A copy of this note will be routed to requesting surgeon.  Time:   Today, I have spent 10 minutes with the patient with telehealth technology discussing medical history, symptoms, and management plan.     Elgie Collard, PA-C  11/07/2021, 10:21 AM

## 2021-11-07 NOTE — Telephone Encounter (Signed)
Confirmed with patient that she can hold Eliquis 2 days prior to her procedure. Patient verbalized understanding.

## 2021-11-07 NOTE — Telephone Encounter (Signed)
Office note from today:  History of Present Illness    Tonya Bartlett is a 76 y.o. female who presents via audio/video conferencing for a telehealth visit today.  Pt was last seen in cardiology clinic on 08/07/21 by Dr. Gwenlyn Found.  At that time CHERYL CHAY was doing well.  The patient is now pending procedure as outlined above. Since her last visit, she has been doing well without any chest pain or SOB. She had her knee replaced x 2 and has had issues with it. She swims and walks her pups. She plans to start using arm weights. She tells me she is paying way too much for Eliquis and would like to switch to Coumadin if possible. She plans to bring this up at her next appointment. She has some dizziness when she first stands up which has been happening a while. She sees an ENT for this.      She can hold her Eliquis for 1-2 days prior to her procedure. Please restart when medically safe to do so.

## 2021-11-14 ENCOUNTER — Other Ambulatory Visit: Payer: Self-pay | Admitting: Cardiovascular Disease

## 2021-11-17 DIAGNOSIS — G894 Chronic pain syndrome: Secondary | ICD-10-CM | POA: Diagnosis not present

## 2021-11-17 DIAGNOSIS — M47812 Spondylosis without myelopathy or radiculopathy, cervical region: Secondary | ICD-10-CM | POA: Diagnosis not present

## 2021-11-17 DIAGNOSIS — M150 Primary generalized (osteo)arthritis: Secondary | ICD-10-CM | POA: Diagnosis not present

## 2021-11-17 DIAGNOSIS — L4052 Psoriatic arthritis mutilans: Secondary | ICD-10-CM | POA: Diagnosis not present

## 2021-11-18 ENCOUNTER — Ambulatory Visit (AMBULATORY_SURGERY_CENTER): Payer: Medicare Other | Admitting: Gastroenterology

## 2021-11-18 ENCOUNTER — Encounter: Payer: Self-pay | Admitting: Gastroenterology

## 2021-11-18 VITALS — BP 175/97 | HR 70 | Temp 95.9°F | Resp 17 | Ht 66.0 in | Wt 216.0 lb

## 2021-11-18 DIAGNOSIS — E119 Type 2 diabetes mellitus without complications: Secondary | ICD-10-CM | POA: Diagnosis not present

## 2021-11-18 DIAGNOSIS — D122 Benign neoplasm of ascending colon: Secondary | ICD-10-CM | POA: Diagnosis not present

## 2021-11-18 DIAGNOSIS — Z86010 Personal history of colonic polyps: Secondary | ICD-10-CM | POA: Diagnosis not present

## 2021-11-18 DIAGNOSIS — I10 Essential (primary) hypertension: Secondary | ICD-10-CM | POA: Diagnosis not present

## 2021-11-18 DIAGNOSIS — D125 Benign neoplasm of sigmoid colon: Secondary | ICD-10-CM | POA: Diagnosis not present

## 2021-11-18 DIAGNOSIS — Z09 Encounter for follow-up examination after completed treatment for conditions other than malignant neoplasm: Secondary | ICD-10-CM | POA: Diagnosis not present

## 2021-11-18 MED ORDER — SODIUM CHLORIDE 0.9 % IV SOLN: 500.0000 mL | Freq: Once | INTRAVENOUS | Status: DC

## 2021-11-18 NOTE — Progress Notes (Signed)
[facility] History and Physical   Primary Care Physician:  [name], NP   Reason for Procedure:   History of colon polyps  Plan:    colonoscopy     HPI: Tonya Bartlett is a 76 y.o. female  here for colonoscopy surveillance - numerous polyps in the past. 10 polyps removed 2018, 5 polyps removed 02/2018. Patient denies any bowel symptoms at this time. Otherwise feels well without any cardiopulmonary symptoms. HIstory of AF on Eliquis which has been held for 2 days. She otherwise feels well today.  I have discussed risks / benefits of anesthesia and endoscopic procedure with Noel Christmas Furukawa and they wish to proceed with the exams as outlined today.    Past Medical History:  Diagnosis Date   Alcohol abuse    Sober 41 years.   Allergic rhinitis    Allergy    Anemia    Anxiety    Blood transfusion without reported diagnosis    Breast cancer ([place]) 12/2020   left   Cancer ([place])    Cataract    bil cataracts removed   Clotting disorder ([place])    DVT- 1996 po knee replacement   Colitis    Complication of anesthesia    vomit x1 while on Morphine per pt   Depression    Diverticulosis    Dysrhythmia    Atrial fibrillation   Fatigue    Frequency of urination    GERD (gastroesophageal reflux disease)    History of colon polyps    History of DVT of lower extremity    POST LEFT TOTAL KNEE  1996   History of hiatal hernia    History of iron deficiency anemia 1996   iron infusion   History of migraine headaches    History of radiation therapy    left breast 03/27/2021-04/23/2021 Dr [name]   History of rib fracture    Hyperlipidemia    Hypertension    IBS (irritable bowel syndrome)    Insomnia    Joint pain    MCI (mild cognitive impairment)    Melanoma ([place]) 2019   left leg    Migraine headache    hx of migraines    OA (osteoarthritis)    RIGHT SHOULDER   Osteopenia    Pneumonia    remote history   PONV (postoperative nausea and vomiting)     Recovering alcoholic ([place])    SINCE [id number]   Right rotator cuff tear    SOB (shortness of breath) on exertion    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   Swallowing difficulty    Unspecified essential hypertension     Past Surgical History:  Procedure Laterality Date   BREAST BIOPSY Left 01/06/2021   pos   BREAST LUMPECTOMY WITH RADIOACTIVE SEED LOCALIZATION Left 02/27/2021   Procedure: LEFT BREAST LUMPECTOMY WITH RADIOACTIVE SEED LOCALIZATION;  Surgeon: [name], MD;  Location: [facility];  Service: General;  Laterality: Left;   BUNIONECTOMY/  HAMMERTOE CORRECTION  RIGHT FOOT  2011   CATARACT EXTRACTION W/ INTRAOCULAR LENS  IMPLANT, BILATERAL     COLONOSCOPY     DILATION AND CURETTAGE OF UTERUS  1976   EYE SURGERY Bilateral 2016   cataract removal   KNEE ARTHROSCOPY W/ MENISCECTOMY Bilateral X2  LEFT /    X1  RIGHT   KNEE OPEN LATERAL RELEASE Bilateral    MOHS SURGERY Left 12/15/2017   Melanoma in situ - left calf - [facility]  ORIF ANKLE FRACTURE Right 08/04/2020   Procedure: OPEN REDUCTION INTERNAL FIXATION (ORIF) ANKLE FRACTURE;  Surgeon: Wylene Simmer, MD;  Location: [facility];  Service: Orthopedics;  Laterality: Right;  Mini C-arm, [employer] Small Frag   REPLACEMENT TOTAL KNEE Left 2006   SHOULDER ARTHROSCOPY WITH SUBACROMIAL DECOMPRESSION, ROTATOR CUFF REPAIR AND BICEP TENDON REPAIR Right 05/23/2013   Procedure: RIGHT SHOULDER ARTHROSCOPY EXAM UNDER ANESTHESIA  WITH SUBACROMIAL DECOMPRESSION,DISTAL CLAVICLE RESECTION, SADLABRAL DEBRIDEMENT CHONDROPLASTY, BICEP TENOTOMY ;  Surgeon: Sydnee Cabal, MD;  Location: [facility];  Service: Orthopedics;  Laterality: Right;   TONSILLECTOMY AND ADENOIDECTOMY  AGE 37   TOTAL HIP ARTHROPLASTY Left 05/04/2017   Procedure: LEFT TOTAL HIP ARTHROPLASTY ANTERIOR APPROACH;  Surgeon: Paralee Cancel, MD;  Location: [facility];  Service: Orthopedics;  Laterality: Left;   TOTAL HIP ARTHROPLASTY Right 08/01/2019    Procedure: TOTAL HIP ARTHROPLASTY ANTERIOR APPROACH;  Surgeon: Paralee Cancel, MD;  Location: [facility];  Service: Orthopedics;  Laterality: Right;  20mns   TOTAL KNEE ARTHROPLASTY Bilateral LEFT  1996/   RIGHT 2004   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 Authorizing Provider  Ascorbic Acid (VITAMIN C) 1000 MG tablet Take 1,000 mg by mouth daily.   Yes [provider]  atorvastatin (LIPITOR) 40 MG tablet Take 1 tablet (40 mg total) by mouth daily. 08/07/21  Yes BLorretta Harp MD  betamethasone dipropionate 0.05 % lotion APPLY TOPICALLY TO AFFECTED AREA EVERY DAY 06/25/21  Yes Nafziger, CTommi Rumps NP  famotidine (PEPCID) 20 MG tablet Take 1 tablet (20 mg total) by mouth daily as needed for heartburn or indigestion. Patient taking differently: Take 20 mg by mouth daily. 07/04/20  Yes Kolbi Altadonna, SCarlota Raspberry MD  FIBER PO Take 1 capsule by mouth daily. Pt takes in the evening.   Yes [provider]  HYDROcodone-acetaminophen (NORCO/VICODIN) 5-325 MG tablet 1 tablet as needed 03/07/21  Yes [provider]  L-Methylfolate-B12-B6-B2 (CEREFOLIN) 07-17-48-5 MG TABS Take 1 tablet by mouth daily. 10/09/21  Yes SSuzzanne Cloud NP  losartan (COZAAR) 100 MG tablet TAKE 1 TABLET BY MOUTH EVERY DAY 09/18/21  Yes Nafziger, CTommi Rumps NP  metFORMIN (GLUCOPHAGE) 500 MG tablet Take 1 tablet (500 mg total) by mouth 2 (two) times daily with a meal. 11/05/21  Yes MThomes Dinning MD  metoprolol tartrate (LOPRESSOR) 25 MG tablet TAKE 1/2 TABLET BY MOUTH TWICE A DAY 07/18/21  Yes CSherran Needs NP  omeprazole (PRILOSEC) 40 MG capsule TAKE 1 CAPSULE (40 MG TOTAL) BY MOUTH DAILY. 09/29/21  Yes Zehr, JLaban Emperor PA-C  sulfamethoxazole-trimethoprim (BACTRIM,SEPTRA) 400-80 MG tablet Take 1 tablet by mouth daily. For urethritis.   Yes OKathie Rhodes MD   tamoxifen (NOLVADEX) 10 MG tablet TAKE 0.5 TABLETS BY MOUTH 2 TIMES DAILY. 06/10/21  Yes GNicholas Lose MD  traZODone (DESYREL) 50 MG tablet TAKE 1 TABLET BY MOUTH AT BEDTIME 06/10/21  Yes Nafziger, CTommi Rumps NP  venlafaxine XR (EFFEXOR-XR) 37.5 MG 24 hr capsule Take 1 capsule (37.5 mg total) by mouth daily with breakfast. 08/27/21  Yes AMerian Capron MD  Vitamin D, Ergocalciferol, (DRISDOL) 1.25 MG (50000 UNIT) CAPS capsule Take 1 capsule (50,000 Units total) by mouth every 7 (seven) days. 11/05/21  Yes MThomes Dinning MD  acetaminophen (TYLENOL) 325 MG tablet Take 650 mg by mouth  every 6 (six) hours as needed for moderate pain.    [provider]  Antiseborrheic Products, [place]. (PROMISEB) CREA Uses topical daily as needed on her face    [provider]  atorvastatin (LIPITOR) 20 MG tablet TAKE 1 TABLET BY MOUTH EVERY DAY Patient not taking: Reported on 11/18/2021 11/14/21   [name], MD  ELIQUIS 5 MG TABS tablet TAKE 1 TABLET BY MOUTH TWICE A DAY 06/02/21   [name], MD  hydrocortisone cream 1 % Apply 1 application topically daily as needed for itching.    [provider]  rizatriptan (MAXALT) 10 MG tablet Take 1 tablet (10 mg total) by mouth as needed for migraine. May repeat in 2 hours if needed 03/06/21   [name], NP    Current Outpatient Medications  Medication Sig Dispense Refill   Ascorbic Acid (VITAMIN C) 1000 MG tablet Take 1,000 mg by mouth daily.     atorvastatin (LIPITOR) 40 MG tablet Take 1 tablet (40 mg total) by mouth daily. 90 tablet 3   betamethasone dipropionate 0.05 % lotion APPLY TOPICALLY TO AFFECTED AREA EVERY DAY 60 mL 1   famotidine (PEPCID) 20 MG tablet Take 1 tablet (20 mg total) by mouth daily as needed for heartburn or indigestion. (Patient taking differently: Take 20 mg by mouth daily.) 90 tablet 3   FIBER PO Take 1 capsule by mouth daily. Pt takes in the evening.     HYDROcodone-acetaminophen (NORCO/VICODIN) 5-325 MG  tablet 1 tablet as needed     L-Methylfolate-B12-B6-B2 (CEREFOLIN) 07-17-48-5 MG TABS Take 1 tablet by mouth daily. 90 tablet 1   losartan (COZAAR) 100 MG tablet TAKE 1 TABLET BY MOUTH EVERY DAY 90 tablet 2   metFORMIN (GLUCOPHAGE) 500 MG tablet Take 1 tablet (500 mg total) by mouth 2 (two) times daily with a meal. 60 tablet 0   metoprolol tartrate (LOPRESSOR) 25 MG tablet TAKE 1/2 TABLET BY MOUTH TWICE A DAY 90 tablet 1   omeprazole (PRILOSEC) 40 MG capsule TAKE 1 CAPSULE (40 MG TOTAL) BY MOUTH DAILY. 90 capsule 1   sulfamethoxazole-trimethoprim (BACTRIM,SEPTRA) 400-80 MG tablet Take 1 tablet by mouth daily. For urethritis.     tamoxifen (NOLVADEX) 10 MG tablet TAKE 0.5 TABLETS BY MOUTH 2 TIMES DAILY. 90 tablet 3   traZODone (DESYREL) 50 MG tablet TAKE 1 TABLET BY MOUTH AT BEDTIME 90 tablet 1   venlafaxine XR (EFFEXOR-XR) 37.5 MG 24 hr capsule Take 1 capsule (37.5 mg total) by mouth daily with breakfast. 90 capsule 0   Vitamin D, Ergocalciferol, (DRISDOL) 1.25 MG (50000 UNIT) CAPS capsule Take 1 capsule (50,000 Units total) by mouth every 7 (seven) days. 4 capsule 0   acetaminophen (TYLENOL) 325 MG tablet Take 650 mg by mouth every 6 (six) hours as needed for moderate pain.     Antiseborrheic Products, Misc. (PROMISEB) CREA Uses topical daily as needed on her face     atorvastatin (LIPITOR) 20 MG tablet TAKE 1 TABLET BY MOUTH EVERY DAY (Patient not taking: Reported on 11/18/2021) 90 tablet 3   ELIQUIS 5 MG TABS tablet TAKE 1 TABLET BY MOUTH TWICE A DAY 180 tablet 1   hydrocortisone cream 1 % Apply 1 application topically daily as needed for itching.     rizatriptan (MAXALT) 10 MG tablet Take 1 tablet (10 mg total) by mouth as needed for migraine. May repeat in 2 hours if needed 10 tablet 2   Current Facility-Administered Medications  Medication Dose Route Frequency Provider Last  Rate Last Admin   0.9 %  sodium chloride infusion  500 mL Intravenous Once Yetta Flock, MD        Allergies  as of 11/18/2021 - Review Complete 11/18/2021  Allergen Reaction Noted   Otezla [apremilast] Anaphylaxis 03/15/2018   Penicillins Anaphylaxis    Erythromycin Other (See Comments) 07/04/2011   Morphine and related Nausea And Vomiting 05/15/2013   Nsaids Other (See Comments)    Remicade [infliximab] Other (See Comments) 03/01/2018   Tolmetin  10/19/2017   Nickel Rash 05/15/2013    Family History  Problem Relation Age of Onset   Cancer Mother    Heart disease Mother    Hypertension Mother    Melanoma Mother 84   Obesity Mother    Heart disease Father 56   Hypertension Father    Esophageal cancer Brother    Dementia Paternal Grandfather    Melanoma Niece 48   Colon cancer Neg Hx    Colon polyps Neg Hx    Stomach cancer Neg Hx    Rectal cancer Neg Hx     Social History   Socioeconomic History   Marital status: Widowed    Spouse name: Not on file   Number of children: 3   Years of education: Not on file   Highest education level: Master's degree (e.g., MA, MS, MEng, MEd, MSW, MBA)  Occupational History   Occupation: retired Transport planner  Tobacco Use   Smoking status: Former    Packs/day: 0.50    Years: 15.00    Total pack years: 7.50    Types: Cigarettes    Quit date: 05/15/1985    Years since quitting: 36.5   Smokeless tobacco: Never  Vaping Use   Vaping Use: Never used  Substance and Sexual Activity   Alcohol use: No    Comment: RECOVERING ALCOHOLIC--  QUIT [id number]   Drug use: No   Sexual activity: Not Currently    Birth control/protection: Post-menopausal  Other Topics Concern   Not on file  Social History Narrative   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    Widowed    Three kids    55 grandchildren       Social Determinants of Health   Financial Resource Strain: Low Risk  (04/18/2021)   Overall Financial Resource Strain (CARDIA)    Difficulty of Paying Living Expenses: Not hard at all  Food Insecurity: No Food  Insecurity (04/18/2021)   Hunger Vital Sign    Worried About Running Out of Food in the Last Year: Never true    [place] in the Last Year: Never true  Transportation Needs: No Transportation Needs (04/18/2021)   PRAPARE - Hydrologist (Medical): No    Lack of Transportation (Non-Medical): No  Physical Activity: Inactive (04/18/2021)   Exercise Vital Sign    Days of Exercise per Week: 0 days    Minutes of Exercise per Session: 0 min  Stress: No Stress Concern Present (04/18/2021)   [place]    Feeling of Stress : Not at all  Social Connections: Moderately Integrated (04/18/2021)   Social Connection and Isolation Panel [NHANES]    Frequency of Communication with Friends and Family: More than three times a week    Frequency of Social Gatherings with Friends and Family: More than three times a week    Attends Religious Services: More than 4 times per  year    Active Member of Clubs or Organizations: Yes    Attends Archivist Meetings: More than 4 times per year    Marital Status: Widowed  Intimate Partner Violence: Not At Risk (04/18/2021)   Humiliation, Afraid, Rape, and Kick questionnaire    Fear of Current or Ex-Partner: No    Emotionally Abused: No    Physically Abused: No    Sexually Abused: No    Review of Systems: All other review of systems negative except as mentioned in the HPI.  Physical Exam: Vital signs BP (!) 149/75   Pulse 79   Temp (!) 95.9 F (35.5 C)   Ht '5\' 6"'$  (1.676 m)   Wt 216 lb (98 kg)   SpO2 96%   BMI 34.86 kg/m   General:   Alert,  Well-developed, pleasant and cooperative in NAD Lungs:  Clear throughout to auscultation.   Heart:  Regular rate and rhythm Abdomen:  Soft, nontender and nondistended.   Neuro/Psych:  Alert and cooperative. Normal mood and affect. A and O x 3  Jolly Mango, MD [facility]

## 2021-11-18 NOTE — Progress Notes (Signed)
Report to PACU, RN, vss, BBS= Clear.  

## 2021-11-18 NOTE — Progress Notes (Signed)
Called to room to assist during endoscopic procedure.  Patient ID and intended procedure confirmed with present staff. Received instructions for my participation in the procedure from the performing physician.  

## 2021-11-18 NOTE — Progress Notes (Signed)
Zofran '4mg'$  given in admitting.

## 2021-11-18 NOTE — Op Note (Signed)
[facility] Patient Name: Tonya Bartlett Procedure Date: 11/18/2021 8:26 AM MRN: [id number] Endoscopist: Remo Lipps P. Havery Moros , MD Age: 76 Referring MD:  Date of Birth: 07-04-1945 Gender: Female Account #: [id number] Procedure:                Colonoscopy Indications:              High risk colon cancer surveillance: Personal                            history of colonic polyps - 10 polyps removed 2018,                            5 polyps removed in 2020 Medicines:                Monitored Anesthesia Care Procedure:                Pre-Anesthesia Assessment:                           - Prior to the procedure, a History and Physical                            was performed, and patient medications and                            allergies were reviewed. The patient's tolerance of                            previous anesthesia was also reviewed. The risks                            and benefits of the procedure and the sedation                            options and risks were discussed with the patient.                            All questions were answered, and informed consent                            was obtained. Prior Anticoagulants: The patient has                            taken Eliquis (apixaban), last dose was 2 days                            prior to procedure. ASA Grade Assessment: II - A                            patient with mild systemic disease. After reviewing                            the risks and benefits, the patient was deemed in  satisfactory condition to undergo the procedure.                           After obtaining informed consent, the colonoscope                            was passed under direct vision. Throughout the                            procedure, the patient's blood pressure, pulse, and                            oxygen saturations were monitored continuously. The                            Olympus PCF-H190DL  (#[id number]) Colonoscope was                            introduced through the anus and advanced to the the                            cecum, identified by appendiceal orifice and                            ileocecal valve. The colonoscopy was performed                            without difficulty. The patient tolerated the                            procedure well. The quality of the bowel                            preparation was good. The ileocecal valve,                            appendiceal orifice, and rectum were photographed. Scope In: 8:31:45 AM Scope Out: 8:57:37 AM Scope Withdrawal Time: 0 hours 18 minutes 34 seconds  Total Procedure Duration: 0 hours 25 minutes 52 seconds  Findings:                 The perianal and digital rectal examinations were                            normal.                           A diminutive polyp was found in the ascending                            colon. The polyp was sessile. The polyp was removed                            with a cold biopsy forceps. Resection and retrieval  were complete.                           A 4 mm polyp was found in the sigmoid colon. The                            polyp was sessile. The polyp was removed with a                            cold snare. Resection and retrieval were complete.                           Scattered small-mouthed diverticula were found in                            the entire colon.                           Internal hemorrhoids were found during                            retroflexion. The hemorrhoids were small.                           The left colon was extremely tortuous.                           The exam was otherwise without abnormality. Complications:            No immediate complications. Estimated blood loss:                            Minimal. Estimated Blood Loss:     Estimated blood loss was minimal. Impression:               - One diminutive polyp in  the ascending colon,                            removed with a cold biopsy forceps. Resected and                            retrieved.                           - One 4 mm polyp in the sigmoid colon, removed with                            a cold snare. Resected and retrieved.                           - Diverticulosis in the entire examined colon.                           - Internal hemorrhoids.                           -  Tortuous colon.                           - The examination was otherwise normal. Recommendation:           - Patient has a contact number available for                            emergencies. The signs and symptoms of potential                            delayed complications were discussed with the                            patient. Return to normal activities tomorrow.                            Written discharge instructions were provided to the                            patient.                           - Resume previous diet.                           - Continue present medications.                           - Resume Eliquis tomorrow                           - Await pathology results.                           - Do not think the patient will warrant any further                            exams given the results of this colonoscopy Yalda Herd P. Jurline Folger, MD 11/18/2021 9:02:42 AM This report has been signed electronically.

## 2021-11-18 NOTE — Progress Notes (Signed)
Pt's states no medical or surgical changes since previsit or office visit. 

## 2021-11-18 NOTE — Patient Instructions (Signed)
Handout on polyps, hemorrhoids, and diverticulosis given to patient.  Await pathology results. Resume previous diet and continue present medications. Resume Eliquis tomorrow 11/19/21 at previous dose  No repeat colonoscopy for surveillance is warranted given the results of this colonoscopy!   YOU HAD AN ENDOSCOPIC PROCEDURE TODAY AT [facility]:   Refer to the procedure report that was given to you for any specific questions about what was found during the examination.  If the procedure report does not answer your questions, please call your gastroenterologist to clarify.  If you requested that your care partner not be given the details of your procedure findings, then the procedure report has been included in a sealed envelope for you to review at your convenience later.  YOU SHOULD EXPECT: Some feelings of bloating in the abdomen. Passage of more gas than usual.  Walking can help get rid of the air that was put into your GI tract during the procedure and reduce the bloating. If you had a lower endoscopy (such as a colonoscopy or flexible sigmoidoscopy) you may notice spotting of blood in your stool or on the toilet paper. If you underwent a bowel prep for your procedure, you may not have a normal bowel movement for a few days.  Please Note:  You might notice some irritation and congestion in your nose or some drainage.  This is from the oxygen used during your procedure.  There is no need for concern and it should clear up in a day or so.  SYMPTOMS TO REPORT IMMEDIATELY:  Following lower endoscopy (colonoscopy or flexible sigmoidoscopy):  Excessive amounts of blood in the stool  Significant tenderness or worsening of abdominal pains  Swelling of the abdomen that is new, acute  Fever of 100F or higher  For urgent or emergent issues, a gastroenterologist can be reached at any hour by calling [phone]. Do not use MyChart messaging for urgent concerns.    DIET:  We do  recommend a small meal at first, but then you may proceed to your regular diet.  Drink plenty of fluids but you should avoid alcoholic beverages for 24 hours.  ACTIVITY:  You should plan to take it easy for the rest of today and you should NOT DRIVE or use heavy machinery until tomorrow (because of the sedation medicines used during the test).    FOLLOW UP: Our staff will call the number listed on your records the next business day following your procedure.  We will call around 7:15- 8:00 am to check on you and address any questions or concerns that you may have regarding the information given to you following your procedure. If we do not reach you, we will leave a message.     If any biopsies were taken you will be contacted by phone or by letter within the next 1-3 weeks.  Please call us at [phone] if you have not heard about the biopsies in 3 weeks.    SIGNATURES/CONFIDENTIALITY: You and/or your care partner have signed paperwork which will be entered into your electronic medical record.  These signatures attest to the fact that that the information above on your After Visit Summary has been reviewed and is understood.  Full responsibility of the confidentiality of this discharge information lies with you and/or your care-partner.

## 2021-11-19 ENCOUNTER — Telehealth (INDEPENDENT_AMBULATORY_CARE_PROVIDER_SITE_OTHER): Payer: Medicare Other | Admitting: Psychiatry

## 2021-11-19 ENCOUNTER — Telehealth: Payer: Self-pay

## 2021-11-19 ENCOUNTER — Encounter (HOSPITAL_COMMUNITY): Payer: Self-pay | Admitting: Psychiatry

## 2021-11-19 DIAGNOSIS — F5102 Adjustment insomnia: Secondary | ICD-10-CM

## 2021-11-19 DIAGNOSIS — F331 Major depressive disorder, recurrent, moderate: Secondary | ICD-10-CM | POA: Diagnosis not present

## 2021-11-19 DIAGNOSIS — R931 Abnormal findings on diagnostic imaging of heart and coronary circulation: Secondary | ICD-10-CM

## 2021-11-19 MED ORDER — VENLAFAXINE HCL ER 37.5 MG PO CP24: 37.5000 mg | ORAL_CAPSULE | Freq: Every day | ORAL | 0 refills | Status: DC

## 2021-11-19 NOTE — Progress Notes (Signed)
[facility] Outpatient visit Tele psych  Patient Identification: Tonya Bartlett MRN:  [id number] Date of Evaluation:  11/19/2021 Referral Source: NP Chief Complaint:   Depression follow up  Visit Diagnosis:    ICD-10-CM   1. Major depressive disorder, recurrent episode, moderate (HCC)  F33.1     2. Adjustment insomnia  F51.02      Virtual Visit via Video Note  I connected with Brentlee Sciara Kashani on 11/19/21 at  2:30 PM EDT by a video enabled telemedicine application and verified that I am speaking with the correct person using two identifiers.  Location: Patient: home Provider: home office   I discussed the limitations of evaluation and management by telemedicine and the availability of in person appointments. The patient expressed understanding and agreed to proceed.     I discussed the assessment and treatment plan with the patient. The patient was provided an opportunity to ask questions and all were answered. The patient agreed with the plan and demonstrated an understanding of the instructions.   The patient was advised to call back or seek an in-person evaluation if the symptoms worsen or if the condition fails to improve as anticipated.  I provided 15 minutes of non-face-to-face time during this encounter including charting and review  History of Present Illness: 76 years old currently widowed white female lives by herself has 3 grown kids and 6 grandkids referred initially by family medicine for management of depression   Remains sober 40 years plus and active in [place] Has had breast surgery daignosed with cancer, recovering, on radiation.   Doing fair, active with AA groups  Seeing someone for companionship as well , says it helps   Modifying factors : family,  daughter is now communicating aggravating factors. Hip surgery, breast surgery, medical conditions Severity managing depressionfair  Past Psychiatric History: depression  Previous Psychotropic Medications: Yes    Substance Abuse History in the last 12 months:  No.  Consequences of Substance Abuse: NA  Past Medical History:  Past Medical History:  Diagnosis Date   Alcohol abuse    Sober 41 years.   Allergic rhinitis    Allergy    Anemia    Anxiety    Blood transfusion without reported diagnosis    Breast cancer ([place]) 12/2020   left   Cancer ([place])    Cataract    bil cataracts removed   Clotting disorder ([place])    DVT- 1996 po knee replacement   Colitis    Complication of anesthesia    vomit x1 while on Morphine per pt   Depression    Diverticulosis    Dysrhythmia    Atrial fibrillation   Fatigue    Frequency of urination    GERD (gastroesophageal reflux disease)    History of colon polyps    History of DVT of lower extremity    POST LEFT TOTAL KNEE  1996   History of hiatal hernia    History of iron deficiency anemia 1996   iron infusion   History of migraine headaches    History of radiation therapy    left breast 03/27/2021-04/23/2021 Dr [name]   History of rib fracture    Hyperlipidemia    Hypertension    IBS (irritable bowel syndrome)    Insomnia    Joint pain    MCI (mild cognitive impairment)    Melanoma ([place]) 2019   left leg    Migraine headache    hx of migraines    OA (osteoarthritis)  RIGHT SHOULDER   Osteopenia    Pneumonia    remote history   PONV (postoperative nausea and vomiting)    Recovering alcoholic ([place])    SINCE [phone]   Right rotator cuff tear    SOB (shortness of breath) on exertion    Substance abuse ([place])    recovering alcoholic   Swallowing difficulty    Unspecified essential hypertension     Past Surgical History:  Procedure Laterality Date   BREAST BIOPSY Left 01/06/2021   pos   BREAST LUMPECTOMY WITH RADIOACTIVE SEED LOCALIZATION Left 02/27/2021   Procedure: LEFT BREAST LUMPECTOMY WITH RADIOACTIVE SEED LOCALIZATION;  Surgeon: [name], MD;  Location: [facility];  Service: General;  Laterality: Left;   BUNIONECTOMY/   HAMMERTOE CORRECTION  RIGHT FOOT  2011   CATARACT EXTRACTION W/ INTRAOCULAR LENS  IMPLANT, BILATERAL     COLONOSCOPY     DILATION AND CURETTAGE OF UTERUS  1976   EYE SURGERY Bilateral 2016   cataract removal   KNEE ARTHROSCOPY W/ MENISCECTOMY Bilateral X2  LEFT /    X1  RIGHT   KNEE OPEN LATERAL RELEASE Bilateral    MOHS SURGERY Left 12/15/2017   Melanoma in situ - left calf - [facility]   ORIF ANKLE FRACTURE Right 08/04/2020   Procedure: OPEN REDUCTION INTERNAL FIXATION (ORIF) ANKLE FRACTURE;  Surgeon: Wylene Simmer, MD;  Location: [facility];  Service: Orthopedics;  Laterality: Right;  Mini C-arm, [employer] Small Frag   REPLACEMENT TOTAL KNEE Left 2006   SHOULDER ARTHROSCOPY WITH SUBACROMIAL DECOMPRESSION, ROTATOR CUFF REPAIR AND BICEP TENDON REPAIR Right 05/23/2013   Procedure: RIGHT SHOULDER ARTHROSCOPY EXAM UNDER ANESTHESIA  WITH SUBACROMIAL DECOMPRESSION,DISTAL CLAVICLE RESECTION, SADLABRAL DEBRIDEMENT CHONDROPLASTY, BICEP TENOTOMY ;  Surgeon: Sydnee Cabal, MD;  Location: [place];  Service: Orthopedics;  Laterality: Right;   TONSILLECTOMY AND ADENOIDECTOMY  AGE 14   TOTAL HIP ARTHROPLASTY Left 05/04/2017   Procedure: LEFT TOTAL HIP ARTHROPLASTY ANTERIOR APPROACH;  Surgeon: Paralee Cancel, MD;  Location: [facility];  Service: Orthopedics;  Laterality: Left;   TOTAL HIP ARTHROPLASTY Right 08/01/2019   Procedure: TOTAL HIP ARTHROPLASTY ANTERIOR APPROACH;  Surgeon: Paralee Cancel, MD;  Location: [facility];  Service: Orthopedics;  Laterality: Right;  32mns   TOTAL KNEE ARTHROPLASTY Bilateral LEFT  1996/   RIGHT 2004   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      UPPER GASTROINTESTINAL ENDOSCOPY     UPPER GI ENDOSCOPY     VAGINAL HYSTERECTOMY  1976    Family Psychiatric History: mom possible depression  Family History:  Family History  Problem Relation Age of Onset   Cancer Mother    Heart disease Mother    Hypertension Mother    Melanoma Mother 833  Obesity Mother     Heart disease Father 625  Hypertension Father    Esophageal cancer Brother    Dementia Paternal Grandfather    Melanoma Niece 124  Colon cancer Neg Hx    Colon polyps Neg Hx    Stomach cancer Neg Hx    Rectal cancer Neg Hx     Social History:   Social History   Socioeconomic History   Marital status: Widowed    Spouse name: Not on file   Number of children: 3   Years of education: Not on file   Highest education level: Master's degree (e.g., MA, MS, MEng, MEd, MSW, MBA)  Occupational History   Occupation: retired tTransport planner Tobacco Use   Smoking  status: Former    Packs/day: 0.50    Years: 15.00    Total pack years: 7.50    Types: Cigarettes    Quit date: 05/15/1985    Years since quitting: 36.5   Smokeless tobacco: Never  Vaping Use   Vaping Use: Never used  Substance and Sexual Activity   Alcohol use: No    Comment: RECOVERING ALCOHOLIC--  QUIT [phone]   Drug use: No   Sexual activity: Not Currently    Birth control/protection: Post-menopausal  Other Topics Concern   Not on file  Social History Narrative   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    Widowed    Three kids    51 grandchildren       Social Determinants of Health   Financial Resource Strain: Low Risk  (04/18/2021)   Overall Financial Resource Strain (CARDIA)    Difficulty of Paying Living Expenses: Not hard at all  Food Insecurity: No Food Insecurity (04/18/2021)   Hunger Vital Sign    Worried About Running Out of Food in the Last Year: Never true    [place] in the Last Year: Never true  Transportation Needs: No Transportation Needs (04/18/2021)   PRAPARE - Hydrologist (Medical): No    Lack of Transportation (Non-Medical): No  Physical Activity: Inactive (04/18/2021)   Exercise Vital Sign    Days of Exercise per Week: 0 days    Minutes of Exercise per Session: 0 min  Stress: No Stress Concern Present (04/18/2021)   [place]    Feeling of Stress : Not at all  Social Connections: Moderately Integrated (04/18/2021)   Social Connection and Isolation Panel [NHANES]    Frequency of Communication with Friends and Family: More than three times a week    Frequency of Social Gatherings with Friends and Family: More than three times a week    Attends Religious Services: More than 4 times per year    Active Member of [employer] or Organizations: Yes    Attends Archivist Meetings: More than 4 times per year    Marital Status: Widowed     Allergies:   Allergies  Allergen Reactions   Otezla [Apremilast] Anaphylaxis    Suicidal ideation   Penicillins Anaphylaxis    Has patient had a PCN reaction causing immediate rash, facial/tongue/throat swelling, SOB or lightheadedness with hypotension: Yes Has patient had a PCN reaction causing severe rash involving mucus membranes or skin necrosis: Yes Has patient had a PCN reaction that required hospitalization: No Has patient had a PCN reaction occurring within the last 10 year No If all of the above answers are "NO", then may proceed with Cephalosporin use.    Erythromycin Other (See Comments)    SEVERE STOMACH CRAMPS   Morphine And Related Nausea And Vomiting   Nsaids Other (See Comments)    SEVERE STOMACH CRAMPS, MOUTH SORES **Able to tolerate Tylenol   Remicade [Infliximab] Other (See Comments)    Shut down immune system-BP high   Tolmetin     SEVERE STOMACH CRAMPS   Nickel Rash    Including snaps on hospital gowns     Metabolic Disorder Labs: Lab Results  Component Value Date   HGBA1C 5.3 06/11/2021   No results found for: "PROLACTIN" Lab Results  Component Value Date   CHOL 166 06/11/2021   TRIG 82 06/11/2021   HDL  61 06/11/2021   CHOLHDL 2.7 06/11/2021   VLDL 13.8 04/24/2021   LDLCALC 90 06/11/2021   LDLCALC 97 04/24/2021     Current Medications: Current Outpatient  Medications  Medication Sig Dispense Refill   acetaminophen (TYLENOL) 325 MG tablet Take 650 mg by mouth every 6 (six) hours as needed for moderate pain.     Antiseborrheic Products, Misc. (PROMISEB) CREA Uses topical daily as needed on her face     Ascorbic Acid (VITAMIN C) 1000 MG tablet Take 1,000 mg by mouth daily.     atorvastatin (LIPITOR) 20 MG tablet TAKE 1 TABLET BY MOUTH EVERY DAY (Patient not taking: Reported on 11/18/2021) 90 tablet 3   atorvastatin (LIPITOR) 40 MG tablet Take 1 tablet (40 mg total) by mouth daily. 90 tablet 3   betamethasone dipropionate 0.05 % lotion APPLY TOPICALLY TO AFFECTED AREA EVERY DAY 60 mL 1   ELIQUIS 5 MG TABS tablet TAKE 1 TABLET BY MOUTH TWICE A DAY 180 tablet 1   famotidine (PEPCID) 20 MG tablet Take 1 tablet (20 mg total) by mouth daily as needed for heartburn or indigestion. (Patient taking differently: Take 20 mg by mouth daily.) 90 tablet 3   FIBER PO Take 1 capsule by mouth daily. Pt takes in the evening.     HYDROcodone-acetaminophen (NORCO/VICODIN) 5-325 MG tablet 1 tablet as needed     hydrocortisone cream 1 % Apply 1 application topically daily as needed for itching.     L-Methylfolate-B12-B6-B2 (CEREFOLIN) 07-17-48-5 MG TABS Take 1 tablet by mouth daily. 90 tablet 1   losartan (COZAAR) 100 MG tablet TAKE 1 TABLET BY MOUTH EVERY DAY 90 tablet 2   metFORMIN (GLUCOPHAGE) 500 MG tablet Take 1 tablet (500 mg total) by mouth 2 (two) times daily with a meal. 60 tablet 0   metoprolol tartrate (LOPRESSOR) 25 MG tablet TAKE 1/2 TABLET BY MOUTH TWICE A DAY 90 tablet 1   omeprazole (PRILOSEC) 40 MG capsule TAKE 1 CAPSULE (40 MG TOTAL) BY MOUTH DAILY. 90 capsule 1   rizatriptan (MAXALT) 10 MG tablet Take 1 tablet (10 mg total) by mouth as needed for migraine. May repeat in 2 hours if needed 10 tablet 2   sulfamethoxazole-trimethoprim (BACTRIM,SEPTRA) 400-80 MG tablet Take 1 tablet by mouth daily. For urethritis.     tamoxifen (NOLVADEX) 10 MG tablet TAKE 0.5  TABLETS BY MOUTH 2 TIMES DAILY. 90 tablet 3   traZODone (DESYREL) 50 MG tablet TAKE 1 TABLET BY MOUTH AT BEDTIME 90 tablet 1   venlafaxine XR (EFFEXOR-XR) 37.5 MG 24 hr capsule Take 1 capsule (37.5 mg total) by mouth daily with breakfast. 90 capsule 0   Vitamin D, Ergocalciferol, (DRISDOL) 1.25 MG (50000 UNIT) CAPS capsule Take 1 capsule (50,000 Units total) by mouth every 7 (seven) days. 4 capsule 0   No current facility-administered medications for this visit.     Psychiatric Specialty Exam: Review of Systems  Cardiovascular:  Negative for chest pain.  Psychiatric/Behavioral:  Negative for depression and suicidal ideas.     There were no vitals taken for this visit.There is no height or weight on file to calculate BMI.  General Appearance: casual  Eye Contact:  fair  Speech:  Normal Rate  Volume:  Decreased  Mood: fair  Affect: congruent  Thought Process:  Goal Directed  Orientation:  Full (Time, Place, and Person)  Thought Content:  Rumination  Suicidal Thoughts:  No  Homicidal Thoughts:  No  Memory:  Immediate;   Fair Recent;  Fair  Judgement:  Fair  Insight:  Fair  Psychomotor Activity:  Normal  Concentration:  Concentration: Fair and Attention Span: Fair  Recall:  [employer] of Knowledge:Good  Language: Good  Akathisia:  No  Handed:  Right  AIMS (if indicated):    Assets:  Desire for Improvement  ADL's:  Intact  Cognition: Impaired,  Mild  Sleep:  Variable to fair on meds    Treatment Plan Summary: Medication management and Plan as follows    Prior documentation reviewed  1. MDD, mild to moderate: fair continue effexor 2. Insomnia: manageable with trazdone  Alcohol dependence:sustained remission and continue AA has support  Fu 3-35m      NMerian Capron MD 10/4/20232:35 PM

## 2021-11-19 NOTE — Progress Notes (Unsigned)
Chief Complaint:   OBESITY Tonya Bartlett is here to discuss her progress with her obesity treatment plan along with follow-up of her obesity related diagnoses. Tonya Bartlett is on the Category 2 Plan and states Tonya Bartlett is following her eating plan approximately 50% of the time. Tonya Bartlett states Tonya Bartlett is 49 not exercising.   Today's visit was #: 8 Starting weight: 222 lbs Starting date: 06/11/2021 Today's weight: 212 lbs Today's date: 11/05/2021 Total lbs lost to date: 10 lbs Total lbs lost since last in-office visit: 2 lbs  Interim History: Tonya Bartlett traveled to [place] to her granddaughter's wedding.  Tonya Bartlett from meal plan, but made some healthy choices.  Acknowledges  using weight watchers along with meal plan. Hunger signals and night eating have improved on metformin.  Tonya Bartlett denies side effects.  Has not been swimming due to travelling .  Has left knee pain, seeking ortho.   Subjective:   1. Pre-diabetes Discussed labs with patient today. Based on fasting glucose.  Tonya Bartlett is on metformin without side effects.   2. Essential hypertension Worsening. Blood pressure above target of <140/90 (age) asymptomatic on ARB.   3. Vitamin D deficiency Associated with adiposity, no signs of excess supplementation. Stable.   4. Left knee pain, unspecified chronicity Her left knee is affecting her mobility.    Assessment/Plan:   1. Pre-diabetes Continue reduced calorie meal plan and metformin.   Refill - metFORMIN (GLUCOPHAGE) 500 MG tablet; Take 1 tablet (500 mg total) by mouth 2 (two) times daily with a meal.  Dispense: 60 tablet; Refill: 0  2. Essential hypertension Counseled on monitoring an goal of care, will contact PCP if blood pressure at home is >140/90.  Continue ARB.  Reviewed blood pressure check technique.   3. Vitamin D deficiency Continue high dose Vitamin D.  Check levels at next office visit.   Refill - Vitamin D, Ergocalciferol, (DRISDOL) 1.25 MG (50000 UNIT) CAPS capsule; Take 1 capsule  (50,000 Units total) by mouth every 7 (seven) days.  Dispense: 4 capsule; Refill: 0  4. Left knee pain, unspecified chronicity Follow up with ortho a scheduled.  Tonya Bartlett will be having a MRI.   5. Obesity,current BMI 34.2 Continue to reduce calorie plan with close attention to protein target.  Start some light weights, you tube videos.   Tonya Bartlett is currently in the action stage of change. As such, her goal is to continue with weight loss efforts. Tonya Bartlett has agreed to the Category 2 Plan.  Exercise goals: Older adults should follow the adult guidelines. When older adults cannot meet the adult guidelines, they should be as physically active as their abilities and conditions will allow.   Also, start aqua aerobics when cleared by ortho.   Behavioral modification strategies: increasing lean protein intake and ways to avoid night time snacking.  Tonya Bartlett has agreed to follow-up with our clinic in 3 weeks. Tonya Bartlett was informed of the importance of frequent follow-up visits to maximize her success with intensive lifestyle modifications for her multiple health conditions.   Objective:   Blood pressure (!) 144/78, pulse 64, temperature 97.7 F (36.5 C), height '5\' 6"'$  (1.676 m), weight 212 lb (96.2 kg), SpO2 94 %. Body mass index is 34.22 kg/m.  General: Cooperative, alert, well developed, in no acute distress. HEENT: Conjunctivae and lids unremarkable. Cardiovascular: Regular rhythm.  Lungs: Normal work of breathing. Neurologic: No focal deficits.   Lab Results  Component Value Date   CREATININE 0.70 06/11/2021   BUN 11 06/11/2021   NA  141 06/11/2021   K 4.4 06/11/2021   CL 104 06/11/2021   CO2 25 06/11/2021   Lab Results  Component Value Date   ALT 17 06/11/2021   AST 20 06/11/2021   ALKPHOS 61 06/11/2021   BILITOT 0.6 06/11/2021   Lab Results  Component Value Date   HGBA1C 5.3 06/11/2021   HGBA1C 5.4 04/24/2021   HGBA1C 5.3 11/05/2016   Lab Results  Component Value Date   INSULIN 9.6  06/11/2021   Lab Results  Component Value Date   TSH 2.880 06/11/2021   Lab Results  Component Value Date   CHOL 166 06/11/2021   HDL 61 06/11/2021   LDLCALC 90 06/11/2021   LDLDIRECT 187.9 10/08/2009   TRIG 82 06/11/2021   CHOLHDL 2.7 06/11/2021   Lab Results  Component Value Date   VD25OH 34.3 06/11/2021   Lab Results  Component Value Date   WBC 3.7 06/11/2021   HGB 12.2 06/11/2021   HCT 36.3 06/11/2021   MCV 98 (H) 06/11/2021   PLT 250 06/11/2021   No results found for: "IRON", "TIBC", "FERRITIN"  Obesity Behavioral Intervention:   Approximately 15 minutes were spent on the discussion below.  ASK: We discussed the diagnosis of obesity with Tonya Bartlett today and Tonya Bartlett agreed to give [name] permission to discuss obesity behavioral modification therapy today.  ASSESS: Tonya Bartlett has the diagnosis of obesity and her BMI today is 34.2. Tonya Bartlett is in the action stage of change.   ADVISE: Tonya Bartlett was educated on the multiple health risks of obesity as well as the benefit of weight loss to improve her health. Tonya Bartlett was advised of the need for long term treatment and the importance of lifestyle modifications to improve her current health and to decrease her risk of future health problems.  AGREE: Multiple dietary modification options and treatment options were discussed and Tonya Bartlett agreed to follow the recommendations documented in the above note.  ARRANGE: Tonya Bartlett was educated on the importance of frequent visits to treat obesity as outlined per CMS and USPSTF guidelines and agreed to schedule her next follow up appointment today.  Attestation Statements:   Reviewed by clinician on day of visit: allergies, medications, problem list, medical history, surgical history, family history, social history, and previous encounter notes.  I, Davy Pique, RMA, am acting as transcriptionist for Maudry Mayhew, MD.  I have reviewed the above documentation for accuracy and completeness, and I  agree with the above. -  ***

## 2021-11-19 NOTE — Telephone Encounter (Signed)
  Follow up Call-     11/18/2021    7:52 AM 10/17/2020    9:26 AM  Call back number  Post procedure Call Back phone  # [phone] cell  Permission to leave phone message Yes Yes     Patient questions:  Do you have a fever, pain , or abdominal swelling? No. Pain Score  0 *  Have you tolerated food without any problems? Yes.    Have you been able to return to your normal activities? Yes.    Do you have any questions about your discharge instructions: Diet   No. Medications  No. Follow up visit  No.  Do you have questions or concerns about your Care? No.  Actions: * If pain score is 4 or above: No action needed, pain <4.

## 2021-11-24 DIAGNOSIS — M25571 Pain in right ankle and joints of right foot: Secondary | ICD-10-CM | POA: Diagnosis not present

## 2021-11-24 DIAGNOSIS — M25562 Pain in left knee: Secondary | ICD-10-CM | POA: Diagnosis not present

## 2021-11-24 DIAGNOSIS — Z96652 Presence of left artificial knee joint: Secondary | ICD-10-CM | POA: Diagnosis not present

## 2021-11-24 DIAGNOSIS — Z96651 Presence of right artificial knee joint: Secondary | ICD-10-CM | POA: Diagnosis not present

## 2021-11-27 ENCOUNTER — Other Ambulatory Visit (INDEPENDENT_AMBULATORY_CARE_PROVIDER_SITE_OTHER): Payer: Self-pay | Admitting: Internal Medicine

## 2021-11-27 DIAGNOSIS — R7303 Prediabetes: Secondary | ICD-10-CM

## 2021-11-28 DIAGNOSIS — M1991 Primary osteoarthritis, unspecified site: Secondary | ICD-10-CM | POA: Diagnosis not present

## 2021-11-28 DIAGNOSIS — L401 Generalized pustular psoriasis: Secondary | ICD-10-CM | POA: Diagnosis not present

## 2021-11-28 DIAGNOSIS — M25511 Pain in right shoulder: Secondary | ICD-10-CM | POA: Diagnosis not present

## 2021-11-28 DIAGNOSIS — M5136 Other intervertebral disc degeneration, lumbar region: Secondary | ICD-10-CM | POA: Diagnosis not present

## 2021-11-28 DIAGNOSIS — R5383 Other fatigue: Secondary | ICD-10-CM | POA: Diagnosis not present

## 2021-11-28 DIAGNOSIS — G5622 Lesion of ulnar nerve, left upper limb: Secondary | ICD-10-CM | POA: Diagnosis not present

## 2021-11-28 DIAGNOSIS — M8580 Other specified disorders of bone density and structure, unspecified site: Secondary | ICD-10-CM | POA: Diagnosis not present

## 2021-11-28 DIAGNOSIS — L4050 Arthropathic psoriasis, unspecified: Secondary | ICD-10-CM | POA: Diagnosis not present

## 2021-11-28 DIAGNOSIS — Z6834 Body mass index (BMI) 34.0-34.9, adult: Secondary | ICD-10-CM | POA: Diagnosis not present

## 2021-11-28 DIAGNOSIS — Z111 Encounter for screening for respiratory tuberculosis: Secondary | ICD-10-CM | POA: Diagnosis not present

## 2021-11-28 DIAGNOSIS — M5030 Other cervical disc degeneration, unspecified cervical region: Secondary | ICD-10-CM | POA: Diagnosis not present

## 2021-11-28 DIAGNOSIS — E669 Obesity, unspecified: Secondary | ICD-10-CM | POA: Diagnosis not present

## 2021-12-03 ENCOUNTER — Ambulatory Visit (INDEPENDENT_AMBULATORY_CARE_PROVIDER_SITE_OTHER): Payer: Medicare Other | Admitting: Family Medicine

## 2021-12-04 ENCOUNTER — Encounter (INDEPENDENT_AMBULATORY_CARE_PROVIDER_SITE_OTHER): Payer: Self-pay | Admitting: Family Medicine

## 2021-12-04 ENCOUNTER — Ambulatory Visit (INDEPENDENT_AMBULATORY_CARE_PROVIDER_SITE_OTHER): Payer: Medicare Other | Admitting: Family Medicine

## 2021-12-04 VITALS — BP 110/72 | HR 71 | Temp 98.5°F | Ht 66.0 in | Wt 207.0 lb

## 2021-12-04 DIAGNOSIS — R7303 Prediabetes: Secondary | ICD-10-CM

## 2021-12-04 DIAGNOSIS — Z6833 Body mass index (BMI) 33.0-33.9, adult: Secondary | ICD-10-CM | POA: Diagnosis not present

## 2021-12-04 DIAGNOSIS — E559 Vitamin D deficiency, unspecified: Secondary | ICD-10-CM

## 2021-12-04 DIAGNOSIS — E669 Obesity, unspecified: Secondary | ICD-10-CM

## 2021-12-04 DIAGNOSIS — R718 Other abnormality of red blood cells: Secondary | ICD-10-CM

## 2021-12-04 MED ORDER — VITAMIN D (ERGOCALCIFEROL) 1.25 MG (50000 UNIT) PO CAPS: 50000.0000 [IU] | ORAL_CAPSULE | ORAL | 0 refills | Status: DC

## 2021-12-04 MED ORDER — METFORMIN HCL 500 MG PO TABS: 500.0000 mg | ORAL_TABLET | Freq: Two times a day (BID) | ORAL | 0 refills | Status: DC

## 2021-12-04 NOTE — Progress Notes (Signed)
Office: [phone]  /  Fax: [phone]   Total lbs lost to date: 15 Total lbs lost since last in-office visit: 5     BP 110/72   Pulse 71   Temp 98.5 F (36.9 C)   Ht '5\' 6"'$  (1.676 m)   Wt 207 lb (93.9 kg)   SpO2 97%   BMI 33.41 kg/m  She was weighed on the bioimpedance scale:  Body mass index is 33.41 kg/m.  General:  Alert, oriented and cooperative. Patient is in no acute distress.  Mental Status: Normal mood and affect. Normal behavior. Normal judgment and thought content.        Patient past medical history includes:   Past Medical History:  Diagnosis Date   Alcohol abuse    Sober 41 years.   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)    DVT- 1996 po knee replacement   Colitis    Complication of anesthesia    vomit x1 while on Morphine per pt   Depression    Diverticulosis    Dysrhythmia    Atrial fibrillation   Fatigue    Frequency of urination    GERD (gastroesophageal reflux disease)    History of colon polyps    History of DVT of lower extremity    POST LEFT TOTAL KNEE  1996   History of hiatal hernia    History of iron deficiency anemia 1996   iron infusion   History of migraine headaches    History of radiation therapy    left breast 03/27/2021-04/23/2021 Dr [name]   History of rib fracture    Hyperlipidemia    Hypertension    IBS (irritable bowel syndrome)    Insomnia    Joint pain    MCI (mild cognitive impairment)    Melanoma ([place]) 2019   left leg    Migraine headache    hx of migraines    OA (osteoarthritis)    RIGHT SHOULDER   Osteopenia    Pneumonia    remote history   PONV (postoperative nausea and vomiting)    Recovering alcoholic ([place])    SINCE [id number]   Right rotator cuff tear    SOB (shortness of breath) on exertion    Substance abuse  ([place])    recovering alcoholic   Swallowing difficulty    Unspecified essential hypertension    History of Present Illness The patient presents with a history of obesity, prediabetes, and vitamin D deficiency. They have successfully lost 15 pounds through lifestyle changes and report feeling better overall. The patient has been taking metformin for prediabetes and has not experienced any gastrointestinal upset. Previously, they experienced excessive hunger at night, which has now subsided.  The patient has a history of fluctuating blood sugar levels and has been monitoring their weight loss progress. They express relief that their weight loss has been evenly distributed throughout their body. The patient has been prescribed vitamin D supplements and is due for a follow-up lab test in December to monitor their levels, as well as cholesterol and other markers.  In addition to their prediabetes and vitamin D deficiency, the patient is also dealing with a scalp issue and has been prescribed a biologic medication. They express concern about  the high cost of the medication, which is $19,000 per infusion, with their insurance covering all but $2,000. The patient has a history of negative reactions to other biologic medications, such as Remicade and Otezla, which have caused joint issues.  The patient's recent lab results show slightly low hemoglobin levels and slightly high mean corpuscular volume (MCV), which may indicate a vitamin B12 deficiency. The patient has not had this checked before, and it is suggested that this may be contributing to their fatigue. The patient has also been screened for hepatitis A, B, and C, with all tests coming back negative.  Overall, the patient has made progress in managing their obesity and prediabetes through lifestyle changes and medication. They continue to focus on walking and leg exercises to maintain their weight loss and improve their overall health.  Assessment &  Plan Obesity: Patient has lost 15 pounds total, likely due to lifestyle changes. -Encourage continued weight loss efforts and regular exercise.  Prediabetes: Patient is on metformin and reports no gastrointestinal upset. However, they sometimes forget to take it. -Encourage patient to set reminders to take medication regularly. Refill metformin prescription.  Vitamin D deficiency: Patient is on supplementation. -Refill Vitamin D prescription and recheck levels in December.  Possible B12 deficiency: Hemoglobin is slightly low and MCV is slightly high, which could indicate a B12 deficiency. -Plan to check B12 levels in December.   Follow-up appointment scheduled for November 19th and December 14th (fasting labs).      [name], MD

## 2021-12-15 DIAGNOSIS — L821 Other seborrheic keratosis: Secondary | ICD-10-CM | POA: Diagnosis not present

## 2021-12-15 DIAGNOSIS — Z09 Encounter for follow-up examination after completed treatment for conditions other than malignant neoplasm: Secondary | ICD-10-CM | POA: Diagnosis not present

## 2021-12-15 DIAGNOSIS — Z872 Personal history of diseases of the skin and subcutaneous tissue: Secondary | ICD-10-CM | POA: Diagnosis not present

## 2021-12-15 DIAGNOSIS — D225 Melanocytic nevi of trunk: Secondary | ICD-10-CM | POA: Diagnosis not present

## 2021-12-15 DIAGNOSIS — Z86006 Personal history of melanoma in-situ: Secondary | ICD-10-CM | POA: Diagnosis not present

## 2021-12-15 DIAGNOSIS — L570 Actinic keratosis: Secondary | ICD-10-CM | POA: Diagnosis not present

## 2021-12-15 DIAGNOSIS — D492 Neoplasm of unspecified behavior of bone, soft tissue, and skin: Secondary | ICD-10-CM | POA: Diagnosis not present

## 2021-12-15 DIAGNOSIS — L814 Other melanin hyperpigmentation: Secondary | ICD-10-CM | POA: Diagnosis not present

## 2021-12-15 DIAGNOSIS — Z08 Encounter for follow-up examination after completed treatment for malignant neoplasm: Secondary | ICD-10-CM | POA: Diagnosis not present

## 2021-12-22 ENCOUNTER — Telehealth: Payer: Self-pay | Admitting: Pharmacist

## 2021-12-22 NOTE — Chronic Care Management (AMB) (Signed)
Chronic Care Management Pharmacy Assistant   Name: Tonya Bartlett  MRN: [id number] DOB: September 03, 1945  Reason for Encounter: Disease State General Assessment Per MP   Recent office visits:  None   Recent consult visits:  12/05/21 Hennie Duos. (Rheumatology) - Progress note opened, No additional details available.  12/04/21 Starlyn Skeans, MD (Family Med) - Patient presented for Pre-diabetes and other concerns. No medication changes.  11/28/21 Hennie Duos (Rheumatology) -  Patient presented for follow up on Psoriatic Arthritis. Prescribed Skyrizi.  11/19/21 Fara Boros MD [facility])  - Patient presented via video for Major depressive disorder. No other visit details available.  11/07/21 Elgie Collard, PA-C (Cardiothoracic Surg) - Patient presented for Pre-op Cardiovascular exam. No medication changes.  11/05/21 Thomes Dinning MD (Internal Med) - Patient presented for Pre-diabetes and other concerns. No medication changes.   Hospital visits:  None in previous 6 months  Medications: Outpatient Encounter Medications as of 12/22/2021  Medication Sig   acetaminophen (TYLENOL) 325 MG tablet Take 650 mg by mouth every 6 (six) hours as needed for moderate pain.   Antiseborrheic Products, Misc. (PROMISEB) CREA Uses topical daily as needed on her face   Ascorbic Acid (VITAMIN C) 1000 MG tablet Take 1,000 mg by mouth daily.   atorvastatin (LIPITOR) 20 MG tablet TAKE 1 TABLET BY MOUTH EVERY DAY (Patient not taking: Reported on 11/18/2021)   atorvastatin (LIPITOR) 40 MG tablet Take 1 tablet (40 mg total) by mouth daily.   betamethasone dipropionate 0.05 % lotion APPLY TOPICALLY TO AFFECTED AREA EVERY DAY   ELIQUIS 5 MG TABS tablet TAKE 1 TABLET BY MOUTH TWICE A DAY   famotidine (PEPCID) 20 MG tablet Take 1 tablet (20 mg total) by mouth daily as needed for heartburn or indigestion. (Patient taking differently: Take 20 mg by mouth daily.)   FIBER PO Take 1 capsule by  mouth daily. Pt takes in the evening.   HYDROcodone-acetaminophen (NORCO/VICODIN) 5-325 MG tablet 1 tablet as needed   hydrocortisone cream 1 % Apply 1 application topically daily as needed for itching.   L-Methylfolate-B12-B6-B2 (CEREFOLIN) 07-17-48-5 MG TABS Take 1 tablet by mouth daily.   losartan (COZAAR) 100 MG tablet TAKE 1 TABLET BY MOUTH EVERY DAY   metFORMIN (GLUCOPHAGE) 500 MG tablet Take 1 tablet (500 mg total) by mouth 2 (two) times daily with a meal.   metoprolol tartrate (LOPRESSOR) 25 MG tablet TAKE 1/2 TABLET BY MOUTH TWICE A DAY   omeprazole (PRILOSEC) 40 MG capsule TAKE 1 CAPSULE (40 MG TOTAL) BY MOUTH DAILY.   rizatriptan (MAXALT) 10 MG tablet Take 1 tablet (10 mg total) by mouth as needed for migraine. May repeat in 2 hours if needed   sulfamethoxazole-trimethoprim (BACTRIM,SEPTRA) 400-80 MG tablet Take 1 tablet by mouth daily. For urethritis.   tamoxifen (NOLVADEX) 10 MG tablet TAKE 0.5 TABLETS BY MOUTH 2 TIMES DAILY.   traZODone (DESYREL) 50 MG tablet TAKE 1 TABLET BY MOUTH AT BEDTIME   venlafaxine XR (EFFEXOR-XR) 37.5 MG 24 hr capsule Take 1 capsule (37.5 mg total) by mouth daily with breakfast.   Vitamin D, Ergocalciferol, (DRISDOL) 1.25 MG (50000 UNIT) CAPS capsule Take 1 capsule (50,000 Units total) by mouth every 7 (seven) days.   No facility-administered encounter medications on file as of 12/22/2021.  [place] for General Review Call   Adherence Review:  Does the Clinical Pharmacist Assistant have access to adherence rates? Yes Adherence rates for STAR metric medications   Losartan  100 mg - [facility] 8/3//23 [facility] Losartan 100 mg - Last [facility]/25/21 [facility]  Metformin 500 mg - Last filled 11/05/21 [facility] Metformin 500 mg - Last filled 10/08/21 [facility]  Atorvastatin 40 mg - Last filled 11/14/21 [facility] Atorvastatin 20 mg - Last filled 08/23/21 [facility]  Does the patient have >5 day gap between last estimated  fill dates for any of the above medications or other medication gaps? No    Disease State Questions:  Able to connect with Patient? No    Care Gaps: COVID Booster - Overdue Flu Vaccine - Overdue CCM- Need BP-  110/72 12/04/21 AWV-04/18/21 Lab Results  Component Value Date   HGBA1C 5.3 06/11/2021    Star Rating Drugs: Losartan 100 mg - Last filled 8/3//23 [facility] Metformin 500 mg - Last filled 11/05/21 [facility] Atorvastatin 20 mg - Last filled 11/14/21 90 DS at [place] Pharmacist Assistant [phone]

## 2021-12-23 ENCOUNTER — Other Ambulatory Visit: Payer: Self-pay | Admitting: Cardiovascular Disease

## 2021-12-23 DIAGNOSIS — I480 Paroxysmal atrial fibrillation: Secondary | ICD-10-CM

## 2021-12-23 NOTE — Telephone Encounter (Signed)
Prescription refill request for Eliquis received. Indication:afib Last office visit:9/23 Scr:0.7 Age: 76 Weight:93.9 kg  Prescription refilled

## 2021-12-24 ENCOUNTER — Other Ambulatory Visit: Payer: Self-pay | Admitting: Adult Health

## 2021-12-24 DIAGNOSIS — Z760 Encounter for issue of repeat prescription: Secondary | ICD-10-CM

## 2021-12-24 DIAGNOSIS — G4700 Insomnia, unspecified: Secondary | ICD-10-CM

## 2021-12-25 ENCOUNTER — Other Ambulatory Visit (INDEPENDENT_AMBULATORY_CARE_PROVIDER_SITE_OTHER): Payer: Self-pay | Admitting: Family Medicine

## 2021-12-25 ENCOUNTER — Ambulatory Visit (INDEPENDENT_AMBULATORY_CARE_PROVIDER_SITE_OTHER): Payer: Medicare Other | Admitting: Family Medicine

## 2021-12-25 ENCOUNTER — Encounter (INDEPENDENT_AMBULATORY_CARE_PROVIDER_SITE_OTHER): Payer: Self-pay | Admitting: Family Medicine

## 2021-12-25 VITALS — BP 131/65 | HR 63 | Temp 98.5°F | Ht 66.0 in | Wt 205.8 lb

## 2021-12-25 DIAGNOSIS — M79674 Pain in right toe(s): Secondary | ICD-10-CM | POA: Diagnosis not present

## 2021-12-25 DIAGNOSIS — E669 Obesity, unspecified: Secondary | ICD-10-CM | POA: Diagnosis not present

## 2021-12-25 DIAGNOSIS — Z6833 Body mass index (BMI) 33.0-33.9, adult: Secondary | ICD-10-CM

## 2021-12-25 DIAGNOSIS — R7303 Prediabetes: Secondary | ICD-10-CM | POA: Diagnosis not present

## 2021-12-25 MED ORDER — METFORMIN HCL 500 MG PO TABS: 500.0000 mg | ORAL_TABLET | Freq: Two times a day (BID) | ORAL | 0 refills | Status: DC

## 2022-01-12 NOTE — Progress Notes (Signed)
Chief Complaint:   OBESITY Tonya Bartlett is here to discuss her progress with her obesity treatment plan along with follow-up of her obesity related diagnoses. Female is on the Category 2 Plan and states she is following her eating plan approximately 70% of the time. Tonya Bartlett states she is walking for 30 minutes 4 times per week.  Today's visit was #: 10 Starting weight: 222 lbs Starting date: 06/11/2021 Today's weight: 205 lbs Today's date: 12/25/2021 Total lbs lost to date: 17 Total lbs lost since last in-office visit: 2  Interim History: Tonya Bartlett continues to work on her diet and weight loss.  She is making things given plans and she is already making plans to 7 out left over.  Her hunger is controlled.  She just fell today and injured her toe.  Subjective:   1. Pre-diabetes Tonya Bartlett is on metformin, and she is working on her diet and she is doing well with weight loss.  2. Pain of toe of right foot Tonya Bartlett fell this morning and she stumped her toe.  It is bruised and painful.  Assessment/Plan:   1. Pre-diabetes We will refill metformin for 1 month.  Tonya Bartlett will continue to work on her diet and weight loss, and decreasing simple carbohydrates to help decrease the risk of diabetes.   - metFORMIN (GLUCOPHAGE) 500 MG tablet; Take 1 tablet (500 mg total) by mouth 2 (two) times daily with a meal.  Dispense: 60 tablet; Refill: 0  2. Pain of toe of right foot Tonya Bartlett was advised to buddy tape her second and third toe, wear stiff sole shoes. She is ok to follow-up with her foot surgeon if pain worsens or fails to improve.   3. Obesity,current BMI 33.2 Tonya Bartlett is currently in the action stage of change. As such, her goal is to continue with weight loss efforts. She has agreed to the Category 2 Plan.   We will recheck fasting labs at her next visit.   Exercise goals: As is.   Behavioral modification strategies: holiday eating strategies .  [name] has agreed to follow-up with our clinic in 3  weeks. She was informed of the importance of frequent follow-up visits to maximize her success with intensive lifestyle modifications for her multiple health conditions.   Objective:   Blood pressure 131/65, pulse 63, temperature 98.5 F (36.9 C), height '5\' 6"'$  (1.676 m), weight 205 lb 12.8 oz (93.4 kg), SpO2 97 %. Body mass index is 33.22 kg/m.  General: Cooperative, alert, well developed, in no acute distress. HEENT: Conjunctivae and lids unremarkable. Cardiovascular: Regular rhythm.  Lungs: Normal work of breathing. Neurologic: No focal deficits.   Lab Results  Component Value Date   CREATININE 0.70 06/11/2021   BUN 11 06/11/2021   NA 141 06/11/2021   K 4.4 06/11/2021   CL 104 06/11/2021   CO2 25 06/11/2021   Lab Results  Component Value Date   ALT 17 06/11/2021   AST 20 06/11/2021   ALKPHOS 61 06/11/2021   BILITOT 0.6 06/11/2021   Lab Results  Component Value Date   HGBA1C 5.3 06/11/2021   HGBA1C 5.4 04/24/2021   HGBA1C 5.3 11/05/2016   Lab Results  Component Value Date   INSULIN 9.6 06/11/2021   Lab Results  Component Value Date   TSH 2.880 06/11/2021   Lab Results  Component Value Date   CHOL 166 06/11/2021   HDL 61 06/11/2021   LDLCALC 90 06/11/2021   LDLDIRECT 187.9 10/08/2009   TRIG 82 06/11/2021  CHOLHDL 2.7 06/11/2021   Lab Results  Component Value Date   VD25OH 34.3 06/11/2021   Lab Results  Component Value Date   WBC 3.7 06/11/2021   HGB 12.2 06/11/2021   HCT 36.3 06/11/2021   MCV 98 (H) 06/11/2021   PLT 250 06/11/2021   No results found for: "IRON", "TIBC", "FERRITIN"  Attestation Statements:   Reviewed by clinician on day of visit: allergies, medications, problem list, medical history, surgical history, family history, social history, and previous encounter notes.   I, Maudine Kluesner, am acting as transcriptionist for [name], MD.  I have reviewed the above documentation for accuracy and completeness, and I agree with the  above. -  [name], MD

## 2022-01-15 DIAGNOSIS — G894 Chronic pain syndrome: Secondary | ICD-10-CM | POA: Diagnosis not present

## 2022-01-15 DIAGNOSIS — L4052 Psoriatic arthritis mutilans: Secondary | ICD-10-CM | POA: Diagnosis not present

## 2022-01-15 DIAGNOSIS — M150 Primary generalized (osteo)arthritis: Secondary | ICD-10-CM | POA: Diagnosis not present

## 2022-01-15 DIAGNOSIS — M47812 Spondylosis without myelopathy or radiculopathy, cervical region: Secondary | ICD-10-CM | POA: Diagnosis not present

## 2022-01-17 NOTE — Progress Notes (Signed)
Patient Care Team: [name], NP as PCP - General (Family Medicine) [name], MD as PCP - Cardiology (Cardiology) [name], MD (Inactive) as Consulting Physician (Urology) Ortho, [facility] (Specialist) [name], [facility] as Pharmacist (Pharmacist) [name], RN as Oncology Nurse Navigator [name], RN as Oncology Nurse Navigator [name], MD as Consulting Physician (General Surgery) [name], MD as Consulting Physician (Hematology and Oncology) [name], MD as Consulting Physician (Radiation Oncology)  DIAGNOSIS: No diagnosis found.  SUMMARY OF ONCOLOGIC HISTORY: Oncology History  Ductal carcinoma in situ (DCIS) of left breast  01/06/2021 Initial Diagnosis   Diagnostic mammogram: indeterminate calcifications in the upper outer left breast. Biopsy: DCIS and necrosis and calcifications, ER+(100%)/PR+(70%).    01/15/2021 Cancer Staging   Staging form: Breast, AJCC 8th Edition - Clinical stage from 01/15/2021: Stage 0 (cTis (DCIS), cN0, cM0, ER+, PR+, HER2: Not Assessed) - Signed by [name], MD on 01/15/2021 Stage prefix: Initial diagnosis    Genetic Testing   [facility] CancerNext-Expanded Panel is Negative. Report date is 01/28/2021.  The CancerNext-Expanded gene panel offered by [facility] and includes sequencing, rearrangement, and RNA analysis for the following 77 genes: AIP, ALK, APC, ATM, AXIN2, BAP1, BARD1, BLM, BMPR1A, BRCA1, BRCA2, BRIP1, CDC73, CDH1, CDK4, CDKN1B, CDKN2A, CHEK2, CTNNA1, DICER1, FANCC, FH, FLCN, GALNT12, KIF1B, LZTR1, MAX, MEN1, MET, MLH1, MSH2, MSH3, MSH6, MUTYH, NBN, NF1, NF2, NTHL1, PALB2, PHOX2B, PMS2, POT1, PRKAR1A, PTCH1, PTEN, RAD51C, RAD51D, RB1, RECQL, RET, SDHA, SDHAF2, SDHB, SDHC, SDHD, SMAD4, SMARCA4, SMARCB1, SMARCE1, STK11, SUFU, TMEM127, TP53, TSC1, TSC2, VHL and XRCC2 (sequencing and deletion/duplication); EGFR, EGLN1, HOXB13, KIT, MITF, PDGFRA, POLD1, and POLE (sequencing only); EPCAM and  GREM1 (deletion/duplication only).    02/27/2021 Surgery   BREAST, LEFT, LUMPECTOMY:  - Ductal carcinoma in situ, 0.8 cm in maximal dimension.  - Tumor approaches to 0.5 cm of closest (deep) resection margin.    03/28/2021 - 04/23/2021 Radiation Therapy   Site Technique Total Dose (Gy) Dose per Fx (Gy) Completed Fx Beam Energies  Breast, Left: Breast_L 3D 40.05/40.05 2.67 15/15 10XFFF  Breast, Left: Breast_L_Bst 3D 10/10 2 5/5 6X, 10X     04/30/2021 -  Anti-estrogen oral therapy   Tamoxifen started 5 mg daily     CHIEF COMPLIANT: Follow-up of left breast DCIS   INTERVAL HISTORY: Tonya Bartlett is a 76 y.o. with above-mentioned history of left breast DCIS. Left lumpectomy on 02/27/2021 showed DCIS with tumor approaching to 0.5 cm of closest (deep) resection margin. She presents to the clinic today for follow-up.    ALLERGIES:  is allergic to otezla [apremilast], penicillins, erythromycin, morphine and related, nsaids, remicade [infliximab], tolmetin, and nickel.  MEDICATIONS:  Current Outpatient Medications  Medication Sig Dispense Refill   acetaminophen (TYLENOL) 325 MG tablet Take 650 mg by mouth every 6 (six) hours as needed for moderate pain.     Antiseborrheic Products, Misc. (PROMISEB) CREA Uses topical daily as needed on her face     Ascorbic Acid (VITAMIN C) 1000 MG tablet Take 1,000 mg by mouth daily.     atorvastatin (LIPITOR) 20 MG tablet TAKE 1 TABLET BY MOUTH EVERY DAY (Patient not taking: Reported on 11/18/2021) 90 tablet 3   atorvastatin (LIPITOR) 40 MG tablet Take 1 tablet (40 mg total) by mouth daily. 90 tablet 3   betamethasone dipropionate 0.05 % lotion APPLY TOPICALLY TO AFFECTED AREA EVERY DAY 60 mL 1   ELIQUIS 5 MG TABS tablet TAKE 1 TABLET BY MOUTH TWICE A  DAY 180 tablet 1   famotidine (PEPCID) 20 MG tablet Take 1 tablet (20 mg total) by mouth daily as needed for heartburn or indigestion. (Patient taking differently: Take 20 mg by mouth daily.) 90 tablet 3   FIBER  PO Take 1 capsule by mouth daily. Pt takes in the evening.     HYDROcodone-acetaminophen (NORCO/VICODIN) 5-325 MG tablet 1 tablet as needed     hydrocortisone cream 1 % Apply 1 application topically daily as needed for itching.     L-Methylfolate-B12-B6-B2 (CEREFOLIN) 07-17-48-5 MG TABS Take 1 tablet by mouth daily. 90 tablet 1   losartan (COZAAR) 100 MG tablet TAKE 1 TABLET BY MOUTH EVERY DAY 90 tablet 2   metFORMIN (GLUCOPHAGE) 500 MG tablet Take 1 tablet (500 mg total) by mouth 2 (two) times daily with a meal. 60 tablet 0   metoprolol tartrate (LOPRESSOR) 25 MG tablet TAKE 1/2 TABLET BY MOUTH TWICE A DAY 90 tablet 1   omeprazole (PRILOSEC) 40 MG capsule TAKE 1 CAPSULE (40 MG TOTAL) BY MOUTH DAILY. 90 capsule 1   rizatriptan (MAXALT) 10 MG tablet Take 1 tablet (10 mg total) by mouth as needed for migraine. May repeat in 2 hours if needed 10 tablet 2   sulfamethoxazole-trimethoprim (BACTRIM,SEPTRA) 400-80 MG tablet Take 1 tablet by mouth daily. For urethritis.     tamoxifen (NOLVADEX) 10 MG tablet TAKE 0.5 TABLETS BY MOUTH 2 TIMES DAILY. 90 tablet 3   traZODone (DESYREL) 50 MG tablet TAKE 1 TABLET BY MOUTH EVERYDAY AT BEDTIME 90 tablet 1   venlafaxine XR (EFFEXOR-XR) 37.5 MG 24 hr capsule Take 1 capsule (37.5 mg total) by mouth daily with breakfast. 90 capsule 0   Vitamin D, Ergocalciferol, (DRISDOL) 1.25 MG (50000 UNIT) CAPS capsule Take 1 capsule (50,000 Units total) by mouth every 7 (seven) days. 4 capsule 0   No current facility-administered medications for this visit.    PHYSICAL EXAMINATION: ECOG PERFORMANCE STATUS: {CHL ONC ECOG PS:[phone]}  There were no vitals filed for this visit. There were no vitals filed for this visit.  BREAST:*** No palpable masses or nodules in either right or left breasts. No palpable axillary supraclavicular or infraclavicular adenopathy no breast tenderness or nipple discharge. (exam performed in the presence of a chaperone)  LABORATORY DATA:  I have  reviewed the data as listed    Latest Ref Rng & Units 06/11/2021   10:00 AM 04/24/2021    9:15 AM 02/12/2021    2:33 PM  CMP  Glucose 70 - 99 mg/dL 99  89  96   BUN 8 - 27 mg/dL _0 Creatinine 0.57 - 1.00 mg/dL 0.70  0.68  0.64   Sodium 134 - 144 mmol/L 141  139  137   Potassium 3.5 - 5.2 mmol/L 4.4  4.3  3.6   Chloride 96 - 106 mmol/L 104  104  104   CO2 20 - 29 mmol/L _1 Calcium 8.7 - 10.3 mg/dL 9.2  9.8  9.0   Total Protein 6.0 - 8.5 g/dL 6.9  7.3    Total Bilirubin 0.0 - 1.2 mg/dL 0.6  0.8    Alkaline Phos 44 - 121 IU/L 61  65    AST 0 - 40 IU/L 20  21    ALT 0 - 32 IU/L 17  19      Lab Results  Component Value Date   WBC 3.7 06/11/2021   HGB 12.2 06/11/2021  HCT 36.3 06/11/2021   MCV 98 (H) 06/11/2021   PLT 250 06/11/2021   NEUTROABS 2.3 06/11/2021    ASSESSMENT & PLAN:  No problem-specific Assessment & Plan notes found for this encounter.    No orders of the defined types were placed in this encounter.  The patient has a good understanding of the overall plan. she agrees with it. she will call with any problems that may develop before the next visit here. Total time spent: 30 mins including face to face time and time spent for planning, charting and co-ordination of care   Suzzette Righter, [place] 01/17/22    I Gardiner Coins am scribing for Dr. Lindi Adie  ***

## 2022-01-20 ENCOUNTER — Other Ambulatory Visit (HOSPITAL_COMMUNITY): Payer: Self-pay | Admitting: Nurse Practitioner

## 2022-01-22 ENCOUNTER — Other Ambulatory Visit (INDEPENDENT_AMBULATORY_CARE_PROVIDER_SITE_OTHER): Payer: Self-pay | Admitting: Family Medicine

## 2022-01-22 ENCOUNTER — Inpatient Hospital Stay: Payer: Medicare Other | Attending: Hematology and Oncology | Admitting: Hematology and Oncology

## 2022-01-22 ENCOUNTER — Other Ambulatory Visit: Payer: Self-pay

## 2022-01-22 VITALS — BP 164/65 | HR 69 | Temp 97.3°F | Resp 18 | Ht 66.0 in | Wt 208.5 lb

## 2022-01-22 DIAGNOSIS — Z79810 Long term (current) use of selective estrogen receptor modulators (SERMs): Secondary | ICD-10-CM | POA: Insufficient documentation

## 2022-01-22 DIAGNOSIS — R7303 Prediabetes: Secondary | ICD-10-CM

## 2022-01-22 DIAGNOSIS — D0512 Intraductal carcinoma in situ of left breast: Secondary | ICD-10-CM | POA: Insufficient documentation

## 2022-01-22 DIAGNOSIS — R232 Flushing: Secondary | ICD-10-CM | POA: Diagnosis not present

## 2022-01-22 DIAGNOSIS — Z7901 Long term (current) use of anticoagulants: Secondary | ICD-10-CM | POA: Insufficient documentation

## 2022-01-22 DIAGNOSIS — Z923 Personal history of irradiation: Secondary | ICD-10-CM | POA: Insufficient documentation

## 2022-01-22 DIAGNOSIS — Z7984 Long term (current) use of oral hypoglycemic drugs: Secondary | ICD-10-CM | POA: Diagnosis not present

## 2022-01-22 DIAGNOSIS — Z79899 Other long term (current) drug therapy: Secondary | ICD-10-CM | POA: Diagnosis not present

## 2022-01-22 NOTE — Assessment & Plan Note (Addendum)
01/06/2021:Diagnostic mammogram: indeterminate calcifications in the upper outer left breast. Biopsy: DCIS and necrosis and calcifications, ER+(100%)/PR+(70%).    02/27/2021: Left lumpectomy: Grade 2 DCIS 0.8 cm, margins negative, ER 100%, PR 70%    Treatment plan: 1. adjuvant radiation therapy to be completed 04/25/2021 2. Followed by antiestrogen therapy with tamoxifen 5 years started 04/30/2021   Tamoxifen toxicities: Severe hot flashes: we reduced the dosage of tamoxifen today to 10 mg at bedtime.  Breast cancer surveillance: Breast exam 01/22/2022: Benign Mammogram scheduled for 01/30/2022   Return to clinic in 1 year for follow-up

## 2022-01-27 ENCOUNTER — Telehealth: Payer: Self-pay | Admitting: Adult Health

## 2022-01-27 NOTE — Telephone Encounter (Signed)
Please advise 

## 2022-01-27 NOTE — Telephone Encounter (Signed)
Pt called to ask NP if the new prescription she got from the Rheumatologist Orson Ape for Seborrheic Dermatitis) will interact with the medications she is currently taking.  Please advise.

## 2022-01-28 NOTE — Telephone Encounter (Signed)
Patient notified of update  and verbalized understanding. 

## 2022-01-29 ENCOUNTER — Encounter (INDEPENDENT_AMBULATORY_CARE_PROVIDER_SITE_OTHER): Payer: Self-pay | Admitting: Family Medicine

## 2022-01-29 ENCOUNTER — Ambulatory Visit (INDEPENDENT_AMBULATORY_CARE_PROVIDER_SITE_OTHER): Payer: Medicare Other | Admitting: Family Medicine

## 2022-01-29 ENCOUNTER — Other Ambulatory Visit (INDEPENDENT_AMBULATORY_CARE_PROVIDER_SITE_OTHER): Payer: Self-pay | Admitting: Family Medicine

## 2022-01-29 VITALS — BP 147/78 | HR 63 | Temp 98.0°F | Ht 66.0 in | Wt 201.0 lb

## 2022-01-29 DIAGNOSIS — Z6832 Body mass index (BMI) 32.0-32.9, adult: Secondary | ICD-10-CM | POA: Diagnosis not present

## 2022-01-29 DIAGNOSIS — E538 Deficiency of other specified B group vitamins: Secondary | ICD-10-CM | POA: Diagnosis not present

## 2022-01-29 DIAGNOSIS — E785 Hyperlipidemia, unspecified: Secondary | ICD-10-CM | POA: Diagnosis not present

## 2022-01-29 DIAGNOSIS — R7303 Prediabetes: Secondary | ICD-10-CM

## 2022-01-29 DIAGNOSIS — E559 Vitamin D deficiency, unspecified: Secondary | ICD-10-CM | POA: Diagnosis not present

## 2022-01-29 DIAGNOSIS — E669 Obesity, unspecified: Secondary | ICD-10-CM

## 2022-01-29 MED ORDER — METFORMIN HCL 500 MG PO TABS: 500.0000 mg | ORAL_TABLET | Freq: Two times a day (BID) | ORAL | 0 refills | Status: DC

## 2022-01-29 MED ORDER — VITAMIN D (ERGOCALCIFEROL) 1.25 MG (50000 UNIT) PO CAPS: 50000.0000 [IU] | ORAL_CAPSULE | ORAL | 0 refills | Status: DC

## 2022-01-30 LAB — CBC WITH DIFFERENTIAL/PLATELET
Basophils Absolute: 0.1 10*3/uL (ref 0.0–0.2)
Basos: 2 %
EOS (ABSOLUTE): 0.2 10*3/uL (ref 0.0–0.4)
Eos: 6 %
Hematocrit: 37.1 % (ref 34.0–46.6)
Hemoglobin: 11.9 g/dL (ref 11.1–15.9)
Immature Grans (Abs): 0 10*3/uL (ref 0.0–0.1)
Immature Granulocytes: 0 %
Lymphocytes Absolute: 0.9 10*3/uL (ref 0.7–3.1)
Lymphs: 20 %
MCH: 32.7 pg (ref 26.6–33.0)
MCHC: 32.1 g/dL (ref 31.5–35.7)
MCV: 102 fL — ABNORMAL HIGH (ref 79–97)
Monocytes Absolute: 0.3 10*3/uL (ref 0.1–0.9)
Monocytes: 7 %
Neutrophils Absolute: 2.8 10*3/uL (ref 1.4–7.0)
Neutrophils: 65 %
Platelets: 282 10*3/uL (ref 150–450)
RBC: 3.64 x10E6/uL — ABNORMAL LOW (ref 3.77–5.28)
RDW: 12.2 % (ref 11.7–15.4)
WBC: 4.2 10*3/uL (ref 3.4–10.8)

## 2022-01-30 LAB — CMP14+EGFR
ALT: 18 IU/L (ref 0–32)
AST: 21 IU/L (ref 0–40)
Albumin/Globulin Ratio: 1.6 (ref 1.2–2.2)
Albumin: 4.2 g/dL (ref 3.8–4.8)
Alkaline Phosphatase: 46 IU/L (ref 44–121)
BUN/Creatinine Ratio: 18 (ref 12–28)
BUN: 12 mg/dL (ref 8–27)
Bilirubin Total: 0.5 mg/dL (ref 0.0–1.2)
CO2: 21 mmol/L (ref 20–29)
Calcium: 9.4 mg/dL (ref 8.7–10.3)
Chloride: 102 mmol/L (ref 96–106)
Creatinine, Ser: 0.67 mg/dL (ref 0.57–1.00)
Globulin, Total: 2.6 g/dL (ref 1.5–4.5)
Glucose: 98 mg/dL (ref 70–99)
Potassium: 3.9 mmol/L (ref 3.5–5.2)
Sodium: 140 mmol/L (ref 134–144)
Total Protein: 6.8 g/dL (ref 6.0–8.5)
eGFR: 90 mL/min/{1.73_m2} (ref >59)

## 2022-01-30 LAB — LIPID PANEL WITH LDL/HDL RATIO
Cholesterol, Total: 147 mg/dL (ref 100–199)
HDL: 61 mg/dL (ref >39)
LDL Chol Calc (NIH): 70 mg/dL (ref 0–99)
LDL/HDL Ratio: 1.1 ratio (ref 0.0–3.2)
Triglycerides: 84 mg/dL (ref 0–149)
VLDL Cholesterol Cal: 16 mg/dL (ref 5–40)

## 2022-01-30 LAB — HEMOGLOBIN A1C
Est. average glucose Bld gHb Est-mCnc: 105 mg/dL
Hgb A1c MFr Bld: 5.3 % (ref 4.8–5.6)

## 2022-01-30 LAB — INSULIN, RANDOM: INSULIN: 5.9 u[IU]/mL (ref 2.6–24.9)

## 2022-01-30 LAB — VITAMIN B12: Vitamin B-12: 1862 pg/mL — ABNORMAL HIGH (ref 232–1245)

## 2022-02-04 ENCOUNTER — Ambulatory Visit
Admission: RE | Admit: 2022-02-04 | Discharge: 2022-02-04 | Disposition: A | Discharge status: Home / Self Care | Payer: Medicare Other | Source: Ambulatory Visit | Attending: Adult Health | Admitting: Adult Health

## 2022-02-04 DIAGNOSIS — D0512 Intraductal carcinoma in situ of left breast: Secondary | ICD-10-CM

## 2022-02-04 DIAGNOSIS — Z853 Personal history of malignant neoplasm of breast: Secondary | ICD-10-CM | POA: Diagnosis not present

## 2022-02-12 NOTE — Progress Notes (Signed)
Chief Complaint:   OBESITY Tonya Bartlett is here to discuss her progress with her obesity treatment plan along with follow-up of her obesity related diagnoses. Tonya Bartlett is on the Category 2 Plan and states she is following her eating plan approximately 70% of the time. Tonya Bartlett states she is doing leg lifts and other exercises for 15-30 minutes 2-3 times per week.  Today's visit was #: 11 Starting weight: 222 lbs Starting date: 06/11/2021 Today's weight: 201 lbs Today's date: 01/29/2022 Total lbs lost to date: 21 Total lbs lost since last in-office visit: 4  Interim History: Tonya Bartlett continues to do well with weight loss on her category 2 plan.  Her hunger is mostly controlled and she is doing well with increasing protein and exercise to prevent muscle loss.  Subjective:   1. B12 deficiency Tonya Bartlett is on B12 supplementation, and she is due to have labs rechecked.  2. Vitamin D deficiency Tonya Bartlett is on vitamin D prescription, and her last vitamin D level was below goal.  She is due to have labs rechecked.  3. Pre-diabetes Tonya Bartlett is doing well on her medications, and with her diet.  No side effects were noted.  She notes decrease in polyphagia.  Assessment/Plan:   1. B12 deficiency We will recheck labs today, and we will follow-up at [facility] next visit.  - CBC with Differential/Platelet - Vitamin B12  2. Vitamin D deficiency We will check labs today, and we will refill prescription vitamin D 50,000 IU every week for 1 month.  - Vitamin D, Ergocalciferol, (DRISDOL) 1.25 MG (50000 UNIT) CAPS capsule; Take 1 capsule (50,000 Units total) by mouth every 7 (seven) days.  Dispense: 4 capsule; Refill: 0  3. Pre-diabetes We will check labs today, and we will refill metformin 500 mg twice daily for 1 month.  - metFORMIN (GLUCOPHAGE) 500 MG tablet; Take 1 tablet (500 mg total) by mouth 2 (two) times daily with a meal.  Dispense: 60 tablet; Refill: 0 - CMP14+EGFR - Insulin, random -  Hemoglobin A1c - Lipid Panel With LDL/HDL Ratio  4. Obesity, Current BMI 32.5 [place] is currently in the action stage of change. As such, her goal is to continue with weight loss efforts. She has agreed to the Category 2 Plan.   Exercise goals: As is.   Behavioral modification strategies: increasing lean protein intake.  Tonya Bartlett has agreed to follow-up with our clinic in 4 weeks. She was informed of the importance of frequent follow-up visits to maximize her success with intensive lifestyle modifications for her multiple health conditions.   Debbora was informed we would discuss her lab results at her next visit unless there is a critical issue that needs to be addressed sooner. Makalyn agreed to keep her next visit at the agreed upon time to discuss these results.  Objective:   Blood pressure (!) 147/78, pulse 63, temperature 98 F (36.7 C), height _0  (1.676 m), weight 201 lb (91.2 kg), SpO2 97 %. Body mass index is 32.44 kg/m.  General: Cooperative, alert, well developed, in no acute distress. HEENT: Conjunctivae and lids unremarkable. Cardiovascular: Regular rhythm.  Lungs: Normal work of breathing. Neurologic: No focal deficits.   Lab Results  Component Value Date   CREATININE 0.67 01/29/2022   BUN 12 01/29/2022   NA 140 01/29/2022   K 3.9 01/29/2022   CL 102 01/29/2022   CO2 21 01/29/2022   Lab Results  Component Value Date   ALT 18 01/29/2022   AST 21 01/29/2022  ALKPHOS 46 01/29/2022   BILITOT 0.5 01/29/2022   Lab Results  Component Value Date   HGBA1C 5.3 01/29/2022   HGBA1C 5.3 06/11/2021   HGBA1C 5.4 04/24/2021   HGBA1C 5.3 11/05/2016   Lab Results  Component Value Date   INSULIN 5.9 01/29/2022   INSULIN 9.6 06/11/2021   Lab Results  Component Value Date   TSH 2.880 06/11/2021   Lab Results  Component Value Date   CHOL 147 01/29/2022   HDL 61 01/29/2022   LDLCALC 70 01/29/2022   LDLDIRECT 187.9 10/08/2009   TRIG 84 01/29/2022   CHOLHDL 2.7  06/11/2021   Lab Results  Component Value Date   VD25OH 34.3 06/11/2021   Lab Results  Component Value Date   WBC 4.2 01/29/2022   HGB 11.9 01/29/2022   HCT 37.1 01/29/2022   MCV 102 (H) 01/29/2022   PLT 282 01/29/2022   No results found for: "IRON", "TIBC", "FERRITIN"  Attestation Statements:   Reviewed by clinician on day of visit: allergies, medications, problem list, medical history, surgical history, family history, social history, and previous encounter notes.   I, Ardella Chhim, am acting as transcriptionist for [name], MD.  I have reviewed the above documentation for accuracy and completeness, and I agree with the above. -  [name], MD

## 2022-02-13 DIAGNOSIS — J069 Acute upper respiratory infection, unspecified: Secondary | ICD-10-CM | POA: Diagnosis not present

## 2022-02-13 DIAGNOSIS — Z20822 Contact with and (suspected) exposure to covid-19: Secondary | ICD-10-CM | POA: Diagnosis not present

## 2022-02-13 DIAGNOSIS — R059 Cough, unspecified: Secondary | ICD-10-CM | POA: Diagnosis not present

## 2022-02-17 ENCOUNTER — Other Ambulatory Visit (HOSPITAL_COMMUNITY): Payer: Self-pay | Admitting: Nurse Practitioner

## 2022-02-18 ENCOUNTER — Encounter: Payer: Self-pay | Admitting: Adult Health

## 2022-02-19 ENCOUNTER — Telehealth: Payer: Medicare Other | Admitting: Family Medicine

## 2022-02-19 DIAGNOSIS — J019 Acute sinusitis, unspecified: Secondary | ICD-10-CM

## 2022-02-19 DIAGNOSIS — R3989 Other symptoms and signs involving the genitourinary system: Secondary | ICD-10-CM

## 2022-02-19 DIAGNOSIS — B9689 Other specified bacterial agents as the cause of diseases classified elsewhere: Secondary | ICD-10-CM

## 2022-02-19 MED ORDER — CIPROFLOXACIN HCL 250 MG PO TABS: 250.0000 mg | ORAL_TABLET | Freq: Two times a day (BID) | ORAL | 0 refills | Status: AC

## 2022-02-19 NOTE — Progress Notes (Signed)
Virtual Visit Consent   Tonya Bartlett, you are scheduled for a virtual visit with a [facility] provider today. Just as with appointments in the office, your consent must be obtained to participate. Your consent will be active for this visit and any virtual visit you may have with one of our providers in the next 365 days. If you have a MyChart account, a copy of this consent can be sent to you electronically.  As this is a virtual visit, video technology does not allow for your provider to perform a traditional examination. This may limit your provider's ability to fully assess your condition. If your provider identifies any concerns that need to be evaluated in person or the need to arrange testing (such as labs, EKG, etc.), we will make arrangements to do so. Although advances in technology are sophisticated, we cannot ensure that it will always work on either your end or our end. If the connection with a video visit is poor, the visit may have to be switched to a telephone visit. With either a video or telephone visit, we are not always able to ensure that we have a secure connection.  By engaging in this virtual visit, you consent to the provision of healthcare and authorize for your insurance to be billed (if applicable) for the services provided during this visit. Depending on your insurance coverage, you may receive a charge related to this service.  I need to obtain your verbal consent now. Are you willing to proceed with your visit today? Tonya Bartlett has provided verbal consent on 02/19/2022 for a virtual visit (video or telephone). Tonya Mayo, NP  Date: 02/19/2022 2:57 PM  Virtual Visit via Video Note   I, Tonya Bartlett, connected with  Tonya Bartlett  ([id number], Dec 12, 1945) on 02/19/22 at  3:00 PM EST by a video-enabled telemedicine application and verified that I am speaking with the correct person using two identifiers.  Location: Patient: Virtual Visit Location Patient:  Home Provider: Virtual Visit Location Provider: Home Office   I discussed the limitations of evaluation and management by telemedicine and the availability of in person appointments. The patient expressed understanding and agreed to proceed.    History of Present Illness: Tonya Bartlett is a 77 y.o. who identifies as a female who was assigned female at birth, and is being seen today for cold sinus symptoms. Cough and weakness, lower energy levels until today. Reports fevers T max- 100.6 (last several days but has improved now) and chills. Gets winded when moving around. Denies chest pain, shortness of breath. Cough productive at times-having trouble spitting up anything. Of note Sick day after Christmas 12/26 and went to UC on 02/12/22- went to UC - viral URI infection- provided with any treatment.  Flu, covid, and RSV vaccines completed   Urinary Tract Infection  This is a new problem. The current episode started yesterday (last night). The problem occurs intermittently. The problem has been gradually worsening. The quality of the pain is described as burning. The pain is at a severity of 3/10. The pain is mild. She is Not sexually active. There is No history of pyelonephritis. Associated symptoms include frequency and urgency. Pertinent negatives include no chills, discharge, flank pain, hematuria, hesitancy, nausea, possible pregnancy, sweats or vomiting.     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   PAF (paroxysmal atrial fibrillation) ([place]) 10/22/2020   Family history of heart disease 10/22/2020   Trimalleolar fracture 07/31/2020   Right hip OA 08/01/2019   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   Insomnia 02/03/2019   Onychomycosis 11/28/2018   Obese 05/05/2017   S/P left THA, 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   MCI (mild cognitive impairment) 09/19/2015   Memory deficit 09/19/2015   SIRS (systemic inflammatory response syndrome) ([place]) 10/29/2014   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   Depression 11/16/2013   ADD (attention deficit disorder) 11/17/2011   Essential hypertension, benign 10/15/2009   SEBORRHEIC KERATOSIS, INFLAMED 10/17/2008   CARPAL TUNNEL SYNDROME, LEFT 06/22/2008   Allergic rhinitis 08/30/2007   Hyperlipidemia 11/11/2006   Migraine 11/11/2006   Osteoarthritis 11/11/2006    Allergies:  Allergies  Allergen Reactions   Nsaids Other (See Comments) and Anaphylaxis    SEVERE STOMACH CRAMPS, MOUTH SORES  **Able to tolerate Tylenol   Otezla [Apremilast] Anaphylaxis    Suicidal ideation   Penicillins Anaphylaxis and Other (See Comments)    Has patient had a PCN reaction causing immediate rash, facial/tongue/throat swelling, SOB or lightheadedness with hypotension:  Yes  Has patient had a PCN reaction causing severe rash involving mucus membranes or skin necrosis: Yes  Has patient had a PCN reaction that required hospitalization: No  Has patient had a PCN reaction occurring within the last 10 year No  If all of the above answers are "NO", then may proceed with Cephalosporin use.   Erythromycin Other (See Comments)    SEVERE STOMACH CRAMPS   Morphine And Related Nausea And Vomiting   Penicillamine Other (See Comments)   Remicade [Infliximab] Other (See Comments)    Shut down immune system-BP high   Tolmetin     SEVERE STOMACH CRAMPS   Nickel Rash    Including  snaps on hospital gowns    Medications:  Current Outpatient Medications:    acetaminophen (TYLENOL) 325 MG tablet, Take 650 mg by mouth every 6 (six) hours as needed for moderate pain., Disp: , Rfl:    Antiseborrheic Products, Misc. (PROMISEB) CREA, Uses topical daily as needed on her face, Disp: , Rfl:    Ascorbic Acid (VITAMIN C) 1000 MG tablet, Take 1,000 mg by mouth daily., Disp: , Rfl:    atorvastatin (LIPITOR) 40 MG tablet, Take 1 tablet (40 mg total) by mouth daily., Disp: 90 tablet, Rfl: 3   betamethasone dipropionate 0.05 % lotion, APPLY TOPICALLY TO AFFECTED AREA EVERY DAY, Disp: 60 mL, Rfl: 1   ELIQUIS 5 MG TABS tablet, TAKE 1 TABLET BY MOUTH TWICE A DAY, Disp: 180 tablet, Rfl: 1   famotidine (PEPCID) 20 MG tablet, Take 1 tablet (20 mg total) by mouth daily as needed for heartburn or indigestion. (Patient taking differently: Take 20 mg by mouth daily.), Disp: 90 tablet, Rfl: 3   FIBER PO, Take 1 capsule by mouth daily. Pt takes in the evening., Disp: , Rfl:    HYDROcodone-acetaminophen (NORCO/VICODIN) 5-325 MG tablet, 1 tablet as needed, Disp: , Rfl:  hydrocortisone cream 1 %, Apply 1 application topically daily as needed for itching., Disp: , Rfl:    L-Methylfolate-B12-B6-B2 (CEREFOLIN) 07-17-48-5 MG TABS, Take 1 tablet by mouth daily., Disp: 90 tablet, Rfl: 1   losartan (COZAAR) 100 MG tablet, TAKE 1 TABLET BY MOUTH EVERY DAY, Disp: 90 tablet, Rfl: 2   metFORMIN (GLUCOPHAGE) 500 MG tablet, Take 1 tablet (500 mg total) by mouth 2 (two) times daily with a meal., Disp: 60 tablet, Rfl: 0   metoprolol tartrate (LOPRESSOR) 25 MG tablet, TAKE 0.5 TABLETS (12.5 MG TOTAL) BY MOUTH 2 (TWO) TIMES DAILY., Disp: 45 tablet, Rfl: 2   omeprazole (PRILOSEC) 40 MG capsule, TAKE 1 CAPSULE (40 MG TOTAL) BY MOUTH DAILY., Disp: 90 capsule, Rfl: 1   Risankizumab-rzaa (SKYRIZI PEN [place]), Inject into the skin., Disp: , Rfl:    rizatriptan (MAXALT) 10 MG tablet, Take 1 tablet (10 mg total) by mouth as needed  for migraine. May repeat in 2 hours if needed, Disp: 10 tablet, Rfl: 2   sulfamethoxazole-trimethoprim (BACTRIM,SEPTRA) 400-80 MG tablet, Take 1 tablet by mouth daily. For urethritis., Disp: , Rfl:    tamoxifen (NOLVADEX) 10 MG tablet, TAKE 0.5 TABLETS BY MOUTH 2 TIMES DAILY., Disp: 90 tablet, Rfl: 3   traZODone (DESYREL) 50 MG tablet, TAKE 1 TABLET BY MOUTH EVERYDAY AT BEDTIME, Disp: 90 tablet, Rfl: 1   venlafaxine XR (EFFEXOR-XR) 37.5 MG 24 hr capsule, Take 1 capsule (37.5 mg total) by mouth daily with breakfast., Disp: 90 capsule, Rfl: 0   Vitamin D, Ergocalciferol, (DRISDOL) 1.25 MG (50000 UNIT) CAPS capsule, Take 1 capsule (50,000 Units total) by mouth every 7 (seven) days., Disp: 4 capsule, Rfl: 0  Observations/Objective: Patient is well-developed, well-nourished in no acute distress.  Resting comfortably  at home.  Head is normocephalic, atraumatic.  No labored breathing.  Speech is clear and coherent with logical content.  Patient is alert and oriented at baseline.    Assessment and Plan:   1. Acute bacterial rhinosinusitis  - ciprofloxacin (CIPRO) 250 MG tablet; Take 1 tablet (250 mg total) by mouth 2 (two) times daily for 5 days.  Dispense: 10 tablet; Refill: 0  2. Suspected UTI  - ciprofloxacin (CIPRO) 250 MG tablet; Take 1 tablet (250 mg total) by mouth 2 (two) times daily for 5 days.  Dispense: 10 tablet; Refill: 0   -Take meds as prescribed -Rest -Use a cool mist humidifier especially during the winter months when heat dries out the air. - Use saline nose sprays frequently to help soothe nasal passages and promote drainage. -Saline irrigations of the nose can be very helpful if done frequently.             * 4X daily for 1 week*             * Use of a nettie pot can be helpful with this.  *Follow directions with this* *Boiled or distilled water only -stay hydrated by drinking plenty of fluids - Keep thermostat turn down low to prevent drying out sinuses - For any  cough or congestion- robitussin DM or Delsym as needed - For fever or aches or pains- take tylenol or ibuprofen as directed on bottle             * for fevers greater than 101 orally you may alternate ibuprofen and tylenol every 3 hours.  If you do not improve you will need a follow up visit in person.  Follow up with urology as well   Reviewed side effects, risks and benefits of medication.    Patient acknowledged agreement and understanding of the plan.   Past Medical, Surgical, Social History, Allergies, and Medications have been Reviewed.    Follow Up Instructions: I discussed the assessment and treatment plan with the patient. The patient was provided an opportunity to ask questions and all were answered. The patient agreed with the plan and demonstrated an understanding of the instructions.  A copy of instructions were sent to the patient via MyChart unless otherwise noted below.     The patient was advised to call back or seek an in-person evaluation if the symptoms worsen or if the condition fails to improve as anticipated.  Time:  I spent 10 minutes with the patient via telehealth technology discussing the above problems/concerns.    Tonya Mayo, NP

## 2022-02-19 NOTE — Patient Instructions (Addendum)
Tonya Bartlett, thank you for joining [name], NP for today's virtual visit.  While this provider is not your primary care provider (PCP), if your PCP is located in our provider database this encounter information will be shared with them immediately following your visit.   [facility] account gives you access to today's visit and all your visits, tests, and labs performed at [facility] " click here if you don't have a [facility] account or go to [email or web address]  Consent: (Patient) Tonya Bartlett provided verbal consent for this virtual visit at the beginning of the encounter.  Current Medications:  Current Outpatient Medications:    ciprofloxacin (CIPRO) 250 MG tablet, Take 1 tablet (250 mg total) by mouth 2 (two) times daily for 5 days., Disp: 10 tablet, Rfl: 0   acetaminophen (TYLENOL) 325 MG tablet, Take 650 mg by mouth every 6 (six) hours as needed for moderate pain., Disp: , Rfl:    Antiseborrheic Products, Misc. (PROMISEB) CREA, Uses topical daily as needed on her face, Disp: , Rfl:    Ascorbic Acid (VITAMIN C) 1000 MG tablet, Take 1,000 mg by mouth daily., Disp: , Rfl:    atorvastatin (LIPITOR) 40 MG tablet, Take 1 tablet (40 mg total) by mouth daily., Disp: 90 tablet, Rfl: 3   betamethasone dipropionate 0.05 % lotion, APPLY TOPICALLY TO AFFECTED AREA EVERY DAY, Disp: 60 mL, Rfl: 1   ELIQUIS 5 MG TABS tablet, TAKE 1 TABLET BY MOUTH TWICE A DAY, Disp: 180 tablet, Rfl: 1   famotidine (PEPCID) 20 MG tablet, Take 1 tablet (20 mg total) by mouth daily as needed for heartburn or indigestion. (Patient taking differently: Take 20 mg by mouth daily.), Disp: 90 tablet, Rfl: 3   FIBER PO, Take 1 capsule by mouth daily. Pt takes in the evening., Disp: , Rfl:    HYDROcodone-acetaminophen (NORCO/VICODIN) 5-325 MG tablet, 1 tablet as needed, Disp: , Rfl:    hydrocortisone cream 1 %, Apply 1 application topically daily as needed for itching., Disp: ,  Rfl:    L-Methylfolate-B12-B6-B2 (CEREFOLIN) 07-17-48-5 MG TABS, Take 1 tablet by mouth daily., Disp: 90 tablet, Rfl: 1   losartan (COZAAR) 100 MG tablet, TAKE 1 TABLET BY MOUTH EVERY DAY, Disp: 90 tablet, Rfl: 2   metFORMIN (GLUCOPHAGE) 500 MG tablet, Take 1 tablet (500 mg total) by mouth 2 (two) times daily with a meal., Disp: 60 tablet, Rfl: 0   metoprolol tartrate (LOPRESSOR) 25 MG tablet, TAKE 0.5 TABLETS (12.5 MG TOTAL) BY MOUTH 2 (TWO) TIMES DAILY., Disp: 45 tablet, Rfl: 2   omeprazole (PRILOSEC) 40 MG capsule, TAKE 1 CAPSULE (40 MG TOTAL) BY MOUTH DAILY., Disp: 90 capsule, Rfl: 1   Risankizumab-rzaa (SKYRIZI PEN ), Inject into the skin., Disp: , Rfl:    rizatriptan (MAXALT) 10 MG tablet, Take 1 tablet (10 mg total) by mouth as needed for migraine. May repeat in 2 hours if needed, Disp: 10 tablet, Rfl: 2   sulfamethoxazole-trimethoprim (BACTRIM,SEPTRA) 400-80 MG tablet, Take 1 tablet by mouth daily. For urethritis., Disp: , Rfl:    tamoxifen (NOLVADEX) 10 MG tablet, TAKE 0.5 TABLETS BY MOUTH 2 TIMES DAILY., Disp: 90 tablet, Rfl: 3   traZODone (DESYREL) 50 MG tablet, TAKE 1 TABLET BY MOUTH EVERYDAY AT BEDTIME, Disp: 90 tablet, Rfl: 1   venlafaxine XR (EFFEXOR-XR) 37.5 MG 24 hr capsule, Take 1 capsule (37.5 mg total) by mouth daily with breakfast., Disp: 90 capsule, Rfl: 0   Vitamin D, Ergocalciferol, (DRISDOL)  1.25 MG (50000 UNIT) CAPS capsule, Take 1 capsule (50,000 Units total) by mouth every 7 (seven) days., Disp: 4 capsule, Rfl: 0   Medications ordered in this encounter:  Meds ordered this encounter  Medications   ciprofloxacin (CIPRO) 250 MG tablet    Sig: Take 1 tablet (250 mg total) by mouth 2 (two) times daily for 5 days.    Dispense:  10 tablet    Refill:  0    Order Specific Question:   Supervising Provider    Answer:   Chase Picket [id number]     *If you need refills on other medications prior to your next appointment, please contact your pharmacy*  Follow-Up: Call  back or seek an in-person evaluation if the symptoms worsen or if the condition fails to improve as anticipated.  [place] [phone]  Other Instructions  Thank you for continuing AA so that you can encourage others. I appreciate you sharing that with me today. Something to be very proud of.  -Take meds as prescribed -Rest -Use a cool mist humidifier especially during the winter months when heat dries out the air. - Use saline nose sprays frequently to help soothe nasal passages and promote drainage. -Saline irrigations of the nose can be very helpful if done frequently.             * 4X daily for 1 week*             * Use of a nettie pot can be helpful with this.  *Follow directions with this* *Boiled or distilled water only -stay hydrated by drinking plenty of fluids - Keep thermostat turn down low to prevent drying out sinuses - For any cough or congestion- robitussin DM or Delsym as needed - For fever or aches or pains- take tylenol or ibuprofen as directed on bottle             * for fevers greater than 101 orally you may alternate ibuprofen and tylenol every 3 hours.  If you do not improve you will need a follow up visit in person.               For UTI symptoms please follow up with your urologist.    If you have been instructed to have an in-person evaluation today at a local Urgent Care facility, please use the link below. It will take you to a list of all of our available [facility], including address, phone number and hours of operation. Please do not delay care.  [facility]  If you or a family member do not have a primary care provider, use the link below to schedule a visit and establish care. When you choose a [facility] primary care physician or advanced practice provider, you gain a long-term partner in health. Find a Primary Care Provider  Learn more about [facility] in-office and virtual care options: [facility]

## 2022-02-24 DIAGNOSIS — M8580 Other specified disorders of bone density and structure, unspecified site: Secondary | ICD-10-CM | POA: Diagnosis not present

## 2022-02-24 DIAGNOSIS — L401 Generalized pustular psoriasis: Secondary | ICD-10-CM | POA: Diagnosis not present

## 2022-02-24 DIAGNOSIS — E669 Obesity, unspecified: Secondary | ICD-10-CM | POA: Diagnosis not present

## 2022-02-24 DIAGNOSIS — Z6832 Body mass index (BMI) 32.0-32.9, adult: Secondary | ICD-10-CM | POA: Diagnosis not present

## 2022-02-24 DIAGNOSIS — M5136 Other intervertebral disc degeneration, lumbar region: Secondary | ICD-10-CM | POA: Diagnosis not present

## 2022-02-24 DIAGNOSIS — M1991 Primary osteoarthritis, unspecified site: Secondary | ICD-10-CM | POA: Diagnosis not present

## 2022-02-24 DIAGNOSIS — L4050 Arthropathic psoriasis, unspecified: Secondary | ICD-10-CM | POA: Diagnosis not present

## 2022-02-24 DIAGNOSIS — G5622 Lesion of ulnar nerve, left upper limb: Secondary | ICD-10-CM | POA: Diagnosis not present

## 2022-02-24 DIAGNOSIS — M5030 Other cervical disc degeneration, unspecified cervical region: Secondary | ICD-10-CM | POA: Diagnosis not present

## 2022-02-24 DIAGNOSIS — M25511 Pain in right shoulder: Secondary | ICD-10-CM | POA: Diagnosis not present

## 2022-02-27 ENCOUNTER — Other Ambulatory Visit (INDEPENDENT_AMBULATORY_CARE_PROVIDER_SITE_OTHER): Payer: Self-pay | Admitting: Family Medicine

## 2022-02-27 DIAGNOSIS — R7303 Prediabetes: Secondary | ICD-10-CM

## 2022-03-02 ENCOUNTER — Telehealth: Payer: Self-pay | Admitting: Cardiovascular Disease

## 2022-03-02 NOTE — Telephone Encounter (Signed)
Attempted to call patient, left message for patient to call back to office.   

## 2022-03-02 NOTE — Progress Notes (Unsigned)
Cardiology Office Note:    Date:  03/03/2022   ID:  Tonya Bartlett, DOB 1945-11-03, MRN [id number]  PCP:  [name], NP  Cardiologist:  Quay Burow, MD  Electrophysiologist:  None   Referring MD: [name], NP   Chief Complaint  Patient presents with   Atrial Fibrillation    History of Present Illness:    Tonya Bartlett is a 77 y.o. female with a hx of breast cancer, DVT, hypertension, hyperlipidemia, atrial fibrillation who presents for follow-up.  She follows with Dr. Gwenlyn Found, initially seen 10/22/2020.  She was noted to be in atrial fibrillation when undergoing endoscopy.  Echocardiogram 12/06/2020 showed normal biventricular function, grade 1 diastolic dysfunction, no significant valvular disease.  Calcium score on 12/06/2020 was 24 (37th percentile), mild aortic valve calcifications, mild ascending aortic dilatation measuring 42 mm.  30-day monitor on 11/29/2020 showed less than 1% A-fib burden, with average rate 120 bpm and longest episode lasting 5 minutes.  She was referred to A-fib clinic.  Started on Eliquis 5 mg twice daily and metoprolol 12.5 mg twice daily.  She called yesterday to report she was having palpitations.  States that for the last 2 weeks she has been having worsening palpitations.  States that on her watch her heart rate will be up to 130s.  States that she has lightheadedness during these episodes.  Denies any syncope but reports she has felt close to passing out.  She is taking Eliquis, denies any bleeding issues.   Past Medical History:  Diagnosis Date   Alcohol abuse    Sober 41 years.   Allergic rhinitis    Allergy    Anemia    Anxiety    Blood transfusion without reported diagnosis    Breast cancer ([place]) 12/2020   left   Cancer ([place])    Cataract    bil cataracts removed   Clotting disorder ([place])    DVT- 1996 po knee replacement   Colitis    Complication of anesthesia    vomit x1 while on Morphine per pt   Depression     Diverticulosis    Dysrhythmia    Atrial fibrillation   Fatigue    Frequency of urination    GERD (gastroesophageal reflux disease)    History of colon polyps    History of DVT of lower extremity    POST LEFT TOTAL KNEE  1996   History of hiatal hernia    History of iron deficiency anemia 1996   iron infusion   History of migraine headaches    History of radiation therapy    left breast 03/27/2021-04/23/2021 Dr [name]   History of rib fracture    Hyperlipidemia    Hypertension    IBS (irritable bowel syndrome)    Insomnia    Joint pain    MCI (mild cognitive impairment)    Melanoma ([place]) 2019   left leg    Migraine headache    hx of migraines    OA (osteoarthritis)    RIGHT SHOULDER   Osteopenia    Pneumonia    remote history   PONV (postoperative nausea and vomiting)    Recovering alcoholic ([place])    SINCE [id number]   Right rotator cuff tear    SOB (shortness of breath) on exertion    Substance abuse ([place])    recovering alcoholic   Swallowing difficulty    Unspecified essential hypertension     Past Surgical History:  Procedure Laterality Date  BREAST BIOPSY Left 01/06/2021   pos   BREAST LUMPECTOMY WITH RADIOACTIVE SEED LOCALIZATION Left 02/27/2021   Procedure: LEFT BREAST LUMPECTOMY WITH RADIOACTIVE SEED LOCALIZATION;  Surgeon: [name], MD;  Location: [facility];  Service: General;  Laterality: Left;   BUNIONECTOMY/  HAMMERTOE CORRECTION  RIGHT FOOT  2011   CATARACT EXTRACTION W/ INTRAOCULAR LENS  IMPLANT, BILATERAL     COLONOSCOPY     DILATION AND CURETTAGE OF UTERUS  1976   EYE SURGERY Bilateral 2016   cataract removal   KNEE ARTHROSCOPY W/ MENISCECTOMY Bilateral X2  LEFT /    X1  RIGHT   KNEE OPEN LATERAL RELEASE Bilateral    MOHS SURGERY Left 12/15/2017   Melanoma in situ - left calf - [facility]   ORIF ANKLE FRACTURE Right 08/04/2020   Procedure: OPEN REDUCTION INTERNAL FIXATION (ORIF) ANKLE FRACTURE;  Surgeon: Wylene Simmer, MD;  Location:  [facility];  Service: Orthopedics;  Laterality: Right;  Mini C-arm, [employer] Small Frag   REPLACEMENT TOTAL KNEE Left 2006   SHOULDER ARTHROSCOPY WITH SUBACROMIAL DECOMPRESSION, ROTATOR CUFF REPAIR AND BICEP TENDON REPAIR Right 05/23/2013   Procedure: RIGHT SHOULDER ARTHROSCOPY EXAM UNDER ANESTHESIA  WITH SUBACROMIAL DECOMPRESSION,DISTAL CLAVICLE RESECTION, SADLABRAL DEBRIDEMENT CHONDROPLASTY, BICEP TENOTOMY ;  Surgeon: Sydnee Cabal, MD;  Location: [facility];  Service: Orthopedics;  Laterality: Right;   TONSILLECTOMY AND ADENOIDECTOMY  AGE 62   TOTAL HIP ARTHROPLASTY Left 05/04/2017   Procedure: LEFT TOTAL HIP ARTHROPLASTY ANTERIOR APPROACH;  Surgeon: Paralee Cancel, MD;  Location: [facility];  Service: Orthopedics;  Laterality: Left;   TOTAL HIP ARTHROPLASTY Right 08/01/2019   Procedure: TOTAL HIP ARTHROPLASTY ANTERIOR APPROACH;  Surgeon: Paralee Cancel, MD;  Location: [facility];  Service: Orthopedics;  Laterality: Right;  57mns   TOTAL KNEE ARTHROPLASTY Bilateral LEFT  1996/   RIGHT 2004   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   Antiseborrheic Products, Misc. (PROMISEB) CREA Uses topical daily as needed on her face   Ascorbic Acid (VITAMIN C) 1000 MG tablet Take 1,000 mg by mouth daily.   atorvastatin (LIPITOR) 40 MG tablet Take 1 tablet (40 mg total) by mouth daily.   betamethasone dipropionate 0.05 % lotion APPLY TOPICALLY TO AFFECTED AREA EVERY DAY   ELIQUIS 5 MG TABS tablet TAKE 1 TABLET BY MOUTH TWICE A DAY   famotidine (PEPCID) 20 MG tablet Take 1 tablet (20 mg total) by mouth daily as needed for heartburn or indigestion. (Patient taking differently: Take 20 mg by mouth daily.)   FIBER PO Take 1 capsule by mouth daily. Pt takes  in the evening.   HYDROcodone-acetaminophen (NORCO/VICODIN) 5-325 MG tablet 1 tablet as needed   hydrocortisone cream 1 % Apply 1 application topically daily as needed for itching.   L-Methylfolate-B12-B6-B2 (CEREFOLIN) 07-17-48-5 MG TABS Take 1 tablet by mouth daily.   losartan (COZAAR) 100 MG tablet TAKE 1 TABLET BY MOUTH EVERY DAY   metFORMIN (GLUCOPHAGE) 500 MG tablet Take 1 tablet (500 mg total) by mouth 2 (two) times daily with a meal.   omeprazole (PRILOSEC) 40 MG capsule TAKE 1 CAPSULE (40 MG TOTAL) BY MOUTH DAILY.   Risankizumab-rzaa (SKYRIZI PEN [place]) Inject into the  skin.   rizatriptan (MAXALT) 10 MG tablet Take 1 tablet (10 mg total) by mouth as needed for migraine. May repeat in 2 hours if needed   sulfamethoxazole-trimethoprim (BACTRIM,SEPTRA) 400-80 MG tablet Take 1 tablet by mouth daily. For urethritis.   tamoxifen (NOLVADEX) 10 MG tablet TAKE 0.5 TABLETS BY MOUTH 2 TIMES DAILY.   traZODone (DESYREL) 50 MG tablet TAKE 1 TABLET BY MOUTH EVERYDAY AT BEDTIME   venlafaxine XR (EFFEXOR-XR) 37.5 MG 24 hr capsule Take 1 capsule (37.5 mg total) by mouth daily with breakfast.   Vitamin D, Ergocalciferol, (DRISDOL) 1.25 MG (50000 UNIT) CAPS capsule Take 1 capsule (50,000 Units total) by mouth every 7 (seven) days.   [DISCONTINUED] metoprolol tartrate (LOPRESSOR) 25 MG tablet TAKE 0.5 TABLETS (12.5 MG TOTAL) BY MOUTH 2 (TWO) TIMES DAILY.     Allergies:   Nsaids, Otezla [apremilast], Penicillins, Erythromycin, Morphine and related, Penicillamine, Remicade [infliximab], Tolmetin, and Nickel   Social History   Socioeconomic History   Marital status: Widowed    Spouse name: Not on file   Number of children: 3   Years of education: Not on file   Highest education level: Master's degree (e.g., MA, MS, MEng, MEd, MSW, MBA)  Occupational History   Occupation: retired Transport planner  Tobacco Use   Smoking status: Former    Packs/day: 0.50    Years: 15.00    Total pack years: 7.50    Types:  Cigarettes    Quit date: 05/15/1985    Years since quitting: 36.8   Smokeless tobacco: Never  Vaping Use   Vaping Use: Never used  Substance and Sexual Activity   Alcohol use: No    Comment: RECOVERING ALCOHOLIC--  QUIT [id number]   Drug use: No   Sexual activity: Not Currently    Birth control/protection: Post-menopausal  Other Topics Concern   Not on file  Social History Narrative   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    Widowed    Three kids    23 grandchildren       Social Determinants of Health   Financial Resource Strain: Low Risk  (04/18/2021)   Overall Financial Resource Strain (CARDIA)    Difficulty of Paying Living Expenses: Not hard at all  Food Insecurity: No Food Insecurity (04/18/2021)   Hunger Vital Sign    Worried About Running Out of Food in the Last Year: Never true    [place] in the Last Year: Never true  Transportation Needs: No Transportation Needs (04/18/2021)   PRAPARE - Hydrologist (Medical): No    Lack of Transportation (Non-Medical): No  Physical Activity: Inactive (04/18/2021)   Exercise Vital Sign    Days of Exercise per Week: 0 days    Minutes of Exercise per Session: 0 min  Stress: No Stress Concern Present (04/18/2021)   [place]    Feeling of Stress : Not at all  Social Connections: Moderately Integrated (04/18/2021)   Social Connection and Isolation Panel [NHANES]    Frequency of Communication with Friends and Family: More than three times a week    Frequency of Social Gatherings with Friends and Family: More than three times a week    Attends Religious Services: More than 4 times per year    Active Member of [employer] or Organizations: Yes    Attends Archivist Meetings: More than 4 times per year  Marital Status: Widowed     Family History: The patient's family history includes Cancer in her  mother; Dementia in her paternal grandfather; Esophageal cancer in her brother; Heart disease in her mother; Heart disease (age of onset: 3) in her father; Hypertension in her father and mother; Melanoma (age of onset: 20) in her niece; Melanoma (age of onset: 38) in her mother; Obesity in her mother. There is no history of Colon cancer, Colon polyps, Stomach cancer, or Rectal cancer.  ROS:   Please see the history of present illness.     All other systems reviewed and are negative.  EKGs/Labs/Other Studies Reviewed:    The following studies were reviewed today:   EKG:   03/03/2022: Normal sinus rhythm, rate 69, bundle branch block, LVH  Recent Labs: 06/11/2021: TSH 2.880 01/29/2022: ALT 18; BUN 12; Creatinine, Ser 0.67; Hemoglobin 11.9; Platelets 282; Potassium 3.9; Sodium 140  Recent Lipid Panel    Component Value Date/Time   CHOL 147 01/29/2022 1038   TRIG 84 01/29/2022 1038   HDL 61 01/29/2022 1038   CHOLHDL 2.7 06/11/2021 1000   CHOLHDL 3 04/24/2021 0915   VLDL 13.8 04/24/2021 0915   LDLCALC 70 01/29/2022 1038   LDLDIRECT 187.9 10/08/2009 0925    Physical Exam:    VS:  BP 134/80   Pulse 68   Ht '5\' 6"'$  (1.676 m)   Wt 203 lb 12.8 oz (92.4 kg)   SpO2 96%   BMI 32.89 kg/m     Wt Readings from Last 3 Encounters:  03/03/22 203 lb 12.8 oz (92.4 kg)  01/29/22 201 lb (91.2 kg)  01/22/22 208 lb 8 oz (94.6 kg)     GEN:  Well nourished, well developed in no acute distress HEENT: Normal NECK: No JVD; No carotid bruits LYMPHATICS: No lymphadenopathy CARDIAC: RRR, no murmurs, rubs, gallops RESPIRATORY:  Clear to auscultation without rales, wheezing or rhonchi  ABDOMEN: Soft, non-tender, non-distended MUSCULOSKELETAL:  No edema; No deformity  SKIN: Warm and dry NEUROLOGIC:  Alert and oriented x 3 PSYCHIATRIC:  Normal affect   ASSESSMENT:    1. PAF (paroxysmal atrial fibrillation) ([place])   2. Essential hypertension, benign   3. Elevated coronary artery calcium score     PLAN:    Paroxysmal atrial fibrillation: 30-day monitor on 11/29/2020 showed less than 1% A-fib burden, with average rate 120 bpm and longest episode lasting 5 minutes.  She was referred to A-fib clinic.  Started on Eliquis 5 mg twice daily and metoprolol 12.5 mg twice daily. -Reports having worsening palpitations and heart rate up to 130s on her watch.  Recommend increase metoprolol to 25 mg twice daily -Follow-up in A-fib clinic in 2 weeks.  If continues to be symptomatic despite max tolerated rate control, would favor rhythm control approach with antiarrhythmic versus ablation -Check BMET, CBC, TSH  CAD: Calcium score 24 on 11/2020.  Continue atorvastatin 40 mg daily  Hypertension: On losartan 100 mg daily and metoprolol 12.5 mg twice daily.  Increase metoprolol to 25 mg twice daily as above  Follow-up in A-fib clinic in 2 weeks  Medication Adjustments/Labs and Tests Ordered: Current medicines are reviewed at length with the patient today.  Concerns regarding medicines are outlined above.  Orders Placed This Encounter  Procedures   Basic metabolic panel   CBC   TSH   Amb Referral to AFIB Clinic   EKG 12-Lead   Meds ordered this encounter  Medications   metoprolol tartrate (LOPRESSOR) 25 MG tablet  Sig: Take 1 tablet (25 mg total) by mouth 2 (two) times daily.    Dispense:  180 tablet    Refill:  3    Dose increase    Patient Instructions  Medication Instructions:  INCREASE metoprolol tartrate (Lopressor) to 25 mg two times daily *If you need a refill on your cardiac medications before your next appointment, please call your pharmacy*   Lab Work: BMET, CBC, TSH today  If you have labs (blood work) drawn today and your tests are completely normal, you will receive your results only by: [place] (if you have MyChart) OR A paper copy in the mail If you have any lab test that is abnormal or we need to change your treatment, we will call you to review the  results.   Follow-Up: At [facility], you and your health needs are our priority.  As part of our continuing mission to provide you with exceptional heart care, we have created designated Provider Care Teams.  These Care Teams include your primary Cardiologist (physician) and Advanced Practice Providers (APPs -  Physician Assistants and Nurse Practitioners) who all work together to provide you with the care you need, when you need it.  We recommend signing up for the patient portal called "MyChart".  Sign up information is provided on this After Visit Summary.  MyChart is used to connect with patients for Virtual Visits (Telemedicine).  Patients are able to view lab/test results, encounter notes, upcoming appointments, etc.  Non-urgent messages can be sent to your provider as well.   To learn more about what you can do with MyChart, go to [email or web address].    Your next appointment:   2 week(s) with Afib Clinic      Signed, Donato Heinz, MD  03/03/2022 4:22 PM    [place]

## 2022-03-02 NOTE — Telephone Encounter (Signed)
Returned call to patient who states that 3-4 times daily that her HR goes up to 120-130bpm and then returns to normal. Spell of everything going black/getting very pale this has happened several times in the last several weeks. Patient does report near syncopal event a few weeks ago getting out of the shower.  Patient is still currently taking Eliquis '5mg'$  BID and Metoprolol. Patient reports last BP 152/78. Patient states that at current she feels fine and reports she feels like she needs some medication adjustment as she has had issues with elevated heart rates almost daily. Scheduled patient to see Dr. Gardiner Rhyme (DOD) tomorrow to discuss. Patient aware of appointment time and date.

## 2022-03-02 NOTE — Telephone Encounter (Signed)
Patient c/o Palpitations:  High priority if patient c/o lightheadedness, shortness of breath, or chest pain  How long have you had palpitations/irregular HR/ Afib? Are you having the symptoms now?   Are you currently experiencing lightheadedness, SOB or CP?   Do you have a history of afib (atrial fibrillation) or irregular heart rhythm?   Have you checked your BP or HR? (document readings if available):   Are you experiencing any other symptoms?   Patient sent message to scheduling regarding her Afib, below are her answer's to the questions listed above:  "I have the irregular heartbeat about three or four times a day. It's been going on for about two weeks maybe a little bit longer.  I feel lightheaded and almost a  daze for a few seconds ever so often , that's happened three times in the last four days. A friend I was with Saturday night is a PA and she said my face was white as a sheet.   I just took my blood pressure and it's 152/78 with a heart rate of 57 right now. I had my heart rate on my Apple Watch is where I measure my A Fib.   There's so many questions I don't know if I answered them all, but I can't look back or I lose this text."

## 2022-03-02 NOTE — Telephone Encounter (Signed)
Pt is returning call. Requesting to speak to someone to see if they should go to ED or keep appt just made with APP.

## 2022-03-03 ENCOUNTER — Encounter: Payer: Self-pay | Admitting: Cardiology

## 2022-03-03 ENCOUNTER — Ambulatory Visit: Payer: Medicare Other | Attending: Cardiology | Admitting: Cardiology

## 2022-03-03 VITALS — BP 134/80 | HR 68 | Ht 66.0 in | Wt 203.8 lb

## 2022-03-03 DIAGNOSIS — R931 Abnormal findings on diagnostic imaging of heart and coronary circulation: Secondary | ICD-10-CM | POA: Diagnosis not present

## 2022-03-03 DIAGNOSIS — I10 Essential (primary) hypertension: Secondary | ICD-10-CM | POA: Insufficient documentation

## 2022-03-03 DIAGNOSIS — I480 Paroxysmal atrial fibrillation: Secondary | ICD-10-CM | POA: Diagnosis not present

## 2022-03-03 MED ORDER — METOPROLOL TARTRATE 25 MG PO TABS: 25.0000 mg | ORAL_TABLET | Freq: Two times a day (BID) | ORAL | 3 refills | Status: DC

## 2022-03-03 NOTE — Patient Instructions (Signed)
Medication Instructions:  INCREASE metoprolol tartrate (Lopressor) to 25 mg two times daily *If you need a refill on your cardiac medications before your next appointment, please call your pharmacy*   Lab Work: BMET, CBC, TSH today  If you have labs (blood work) drawn today and your tests are completely normal, you will receive your results only by: [place] (if you have MyChart) OR A paper copy in the mail If you have any lab test that is abnormal or we need to change your treatment, we will call you to review the results.   Follow-Up: At [facility], you and your health needs are our priority.  As part of our continuing mission to provide you with exceptional heart care, we have created designated Provider Care Teams.  These Care Teams include your primary Cardiologist (physician) and Advanced Practice Providers (APPs -  Physician Assistants and Nurse Practitioners) who all work together to provide you with the care you need, when you need it.  We recommend signing up for the patient portal called "MyChart".  Sign up information is provided on this After Visit Summary.  MyChart is used to connect with patients for Virtual Visits (Telemedicine).  Patients are able to view lab/test results, encounter notes, upcoming appointments, etc.  Non-urgent messages can be sent to your provider as well.   To learn more about what you can do with MyChart, go to [email or web address].    Your next appointment:   2 week(s) with Afib Clinic

## 2022-03-04 LAB — CBC
Hematocrit: 34.2 % (ref 34.0–46.6)
Hemoglobin: 11.6 g/dL (ref 11.1–15.9)
MCH: 32.8 pg (ref 26.6–33.0)
MCHC: 33.9 g/dL (ref 31.5–35.7)
MCV: 97 fL (ref 79–97)
Platelets: 267 10*3/uL (ref 150–450)
RBC: 3.54 x10E6/uL — ABNORMAL LOW (ref 3.77–5.28)
RDW: 11.7 % (ref 11.7–15.4)
WBC: 5.6 10*3/uL (ref 3.4–10.8)

## 2022-03-04 LAB — BASIC METABOLIC PANEL
BUN/Creatinine Ratio: 12 (ref 12–28)
BUN: 8 mg/dL (ref 8–27)
CO2: 23 mmol/L (ref 20–29)
Calcium: 9.3 mg/dL (ref 8.7–10.3)
Chloride: 105 mmol/L (ref 96–106)
Creatinine, Ser: 0.67 mg/dL (ref 0.57–1.00)
Glucose: 99 mg/dL (ref 70–99)
Potassium: 4.2 mmol/L (ref 3.5–5.2)
Sodium: 141 mmol/L (ref 134–144)
eGFR: 90 mL/min/{1.73_m2} (ref >59)

## 2022-03-04 LAB — TSH: TSH: 1.93 u[IU]/mL (ref 0.450–4.500)

## 2022-03-05 ENCOUNTER — Encounter (INDEPENDENT_AMBULATORY_CARE_PROVIDER_SITE_OTHER): Payer: Self-pay | Admitting: Family Medicine

## 2022-03-05 ENCOUNTER — Ambulatory Visit: Payer: Medicare Other | Admitting: Physician Assistant

## 2022-03-05 ENCOUNTER — Ambulatory Visit (INDEPENDENT_AMBULATORY_CARE_PROVIDER_SITE_OTHER): Payer: Medicare Other | Admitting: Family Medicine

## 2022-03-05 VITALS — BP 144/83 | HR 67 | Temp 97.5°F | Ht 66.0 in | Wt 199.0 lb

## 2022-03-05 DIAGNOSIS — Z6832 Body mass index (BMI) 32.0-32.9, adult: Secondary | ICD-10-CM

## 2022-03-05 DIAGNOSIS — R7303 Prediabetes: Secondary | ICD-10-CM | POA: Diagnosis not present

## 2022-03-05 DIAGNOSIS — I10 Essential (primary) hypertension: Secondary | ICD-10-CM | POA: Diagnosis not present

## 2022-03-05 DIAGNOSIS — E669 Obesity, unspecified: Secondary | ICD-10-CM | POA: Diagnosis not present

## 2022-03-05 DIAGNOSIS — E559 Vitamin D deficiency, unspecified: Secondary | ICD-10-CM | POA: Diagnosis not present

## 2022-03-05 MED ORDER — METFORMIN HCL 500 MG PO TABS: 500.0000 mg | ORAL_TABLET | Freq: Two times a day (BID) | ORAL | 0 refills | Status: DC

## 2022-03-05 MED ORDER — VITAMIN D (ERGOCALCIFEROL) 1.25 MG (50000 UNIT) PO CAPS: 50000.0000 [IU] | ORAL_CAPSULE | ORAL | 0 refills | Status: DC

## 2022-03-10 NOTE — Progress Notes (Unsigned)
Chief Complaint:   OBESITY Tonya Bartlett is here to discuss her progress with her obesity treatment plan along with follow-up of her obesity related diagnoses. Tonya Bartlett is on {MWMwtlossportion/plan2:23431} and states she is following her eating plan approximately ***% of the time. Tonya Bartlett states she is *** *** minutes *** times per week.  Today's visit was #: *** Starting weight: *** Starting date: *** Today's weight: *** Today's date: 03/05/2022 Total lbs lost to date: *** Total lbs lost since last in-office visit: ***  Interim History: ***  Subjective:   1. Essential hypertension ***  2. Vitamin D deficiency ***  3. Pre-diabetes ***  Assessment/Plan:   1. Essential hypertension ***  2. Vitamin D deficiency *** - Vitamin D, Ergocalciferol, (DRISDOL) 1.25 MG (50000 UNIT) CAPS capsule; Take 1 capsule (50,000 Units total) by mouth every 7 (seven) days.  Dispense: 4 capsule; Refill: 0  3. Pre-diabetes *** - metFORMIN (GLUCOPHAGE) 500 MG tablet; Take 1 tablet (500 mg total) by mouth 2 (two) times daily with a meal.  Dispense: 60 tablet; Refill: 0  4. BMI 32.0-32.9,adult  5. Obesity, Beginning BMI 36.94 Tonya Bartlett is currently in the action stage of change. As such, her goal is to continue with weight loss efforts. She has agreed to the Category 2 Plan.   Exercise goals: Chair yoga or wall pilates exercises were discussed and patient will look into this.   Behavioral modification strategies: increasing lean protein intake.  Tonya Bartlett has agreed to follow-up with our clinic in 4 weeks. She was informed of the importance of frequent follow-up visits to maximize her success with intensive lifestyle modifications for her multiple health conditions.   Objective:   Blood pressure (!) 144/83, pulse 67, temperature (!) 97.5 F (36.4 C), height '5\' 6"'$  (1.676 m), weight 199 lb (90.3 kg), SpO2 95 %. Body mass index is 32.12 kg/m.  General: Cooperative, alert, well developed, in no acute  distress. HEENT: Conjunctivae and lids unremarkable. Cardiovascular: Regular rhythm.  Lungs: Normal work of breathing. Neurologic: No focal deficits.   Lab Results  Component Value Date   CREATININE 0.67 03/03/2022   BUN 8 03/03/2022   NA 141 03/03/2022   K 4.2 03/03/2022   CL 105 03/03/2022   CO2 23 03/03/2022   Lab Results  Component Value Date   ALT 18 01/29/2022   AST 21 01/29/2022   ALKPHOS 46 01/29/2022   BILITOT 0.5 01/29/2022   Lab Results  Component Value Date   HGBA1C 5.3 01/29/2022   HGBA1C 5.3 06/11/2021   HGBA1C 5.4 04/24/2021   HGBA1C 5.3 11/05/2016   Lab Results  Component Value Date   INSULIN 5.9 01/29/2022   INSULIN 9.6 06/11/2021   Lab Results  Component Value Date   TSH 1.930 03/03/2022   Lab Results  Component Value Date   CHOL 147 01/29/2022   HDL 61 01/29/2022   LDLCALC 70 01/29/2022   LDLDIRECT 187.9 10/08/2009   TRIG 84 01/29/2022   CHOLHDL 2.7 06/11/2021   Lab Results  Component Value Date   VD25OH 34.3 06/11/2021   Lab Results  Component Value Date   WBC 5.6 03/03/2022   HGB 11.6 03/03/2022   HCT 34.2 03/03/2022   MCV 97 03/03/2022   PLT 267 03/03/2022   No results found for: "IRON", "TIBC", "FERRITIN"  Attestation Statements:   Reviewed by clinician on day of visit: allergies, medications, problem list, medical history, surgical history, family history, social history, and previous encounter notes.   Wilhemena Durie,  am acting as transcriptionist for [name], MD.  I have reviewed the above documentation for accuracy and completeness, and I agree with the above. -  ***

## 2022-03-12 DIAGNOSIS — M47812 Spondylosis without myelopathy or radiculopathy, cervical region: Secondary | ICD-10-CM | POA: Diagnosis not present

## 2022-03-12 DIAGNOSIS — M150 Primary generalized (osteo)arthritis: Secondary | ICD-10-CM | POA: Diagnosis not present

## 2022-03-12 DIAGNOSIS — L4052 Psoriatic arthritis mutilans: Secondary | ICD-10-CM | POA: Diagnosis not present

## 2022-03-12 DIAGNOSIS — G894 Chronic pain syndrome: Secondary | ICD-10-CM | POA: Diagnosis not present

## 2022-03-17 ENCOUNTER — Ambulatory Visit (HOSPITAL_COMMUNITY)
Admission: RE | Admit: 2022-03-17 | Discharge: 2022-03-17 | Disposition: A | Discharge status: Home / Self Care | Payer: Medicare Other | Source: Ambulatory Visit | Attending: Nurse Practitioner | Admitting: Nurse Practitioner

## 2022-03-17 VITALS — BP 146/80 | HR 49 | Ht 66.0 in | Wt 203.4 lb

## 2022-03-17 DIAGNOSIS — I480 Paroxysmal atrial fibrillation: Secondary | ICD-10-CM | POA: Diagnosis not present

## 2022-03-17 DIAGNOSIS — Z7901 Long term (current) use of anticoagulants: Secondary | ICD-10-CM | POA: Insufficient documentation

## 2022-03-17 DIAGNOSIS — R001 Bradycardia, unspecified: Secondary | ICD-10-CM | POA: Diagnosis not present

## 2022-03-17 DIAGNOSIS — D6869 Other thrombophilia: Secondary | ICD-10-CM | POA: Diagnosis not present

## 2022-03-17 DIAGNOSIS — I10 Essential (primary) hypertension: Secondary | ICD-10-CM | POA: Diagnosis not present

## 2022-03-17 MED ORDER — TAMOXIFEN CITRATE 10 MG PO TABS: ORAL_TABLET | ORAL | Status: DC

## 2022-03-17 NOTE — Progress Notes (Signed)
Primary Care Physician: [name], NP Referring Physician:Dr. Vivia Birmingham Mentor is a 77 y.o. female with a h/o HTN, newly dx afib in the afib clinic for evaluation. She reports that with her apple watch she can see jumps in HR when she is just sitting. These episodes have not been sustained but several times a day. She is reporting fatigue. She is pending surgery the first of the year for breast cancer. Her apple watch does not run ekg's but she is going to update her watch soon to be able to do this. She was started on eliquis 5 mg bid for a CHA2DS2VASc score of at least 4. No tobacco, alcohol or excessive caffeine.   F/u in afib clinic, 03/13/21. She is doing much better with addition of  low dose BB. She has not noted any further heart racing. She was able to get her lumpectomy and get thru surgery without any rhythm issues. No issues with anticoagulation.   F/u in afib clinic, 03/17/22. I have not seen for around one year since new onset afib. She recently saw Dr. Gardiner Rhyme for increase afib burden. He increased her BB and she reports that her afib is  now quiet again. Currently she is happy with management of afib, but would like to see EP to discuss ablation. She is also interested in a Watchman  as it cost her $600 dollars for her eliquis as she hit the donut hole last September for the rest of 2023. Her HR here is 49, at home in the 50's and is not symptomatic with bradycardia.   Today, she denies symptoms of palpitations, chest pain, shortness of breath, orthopnea, PND, lower extremity edema, dizziness, presyncope, syncope, or neurologic sequela. The patient is tolerating medications without difficulties and is otherwise without complaint today.   Past Medical History:  Diagnosis Date   Alcohol abuse    Sober 41 years.   Allergic rhinitis    Allergy    Anemia    Anxiety    Blood transfusion without reported diagnosis    Breast cancer ([facility]) 12/2020   left   Cancer ([place])     Cataract    bil cataracts removed   Clotting disorder ([place])    DVT- 1996 po knee replacement   Colitis    Complication of anesthesia    vomit x1 while on Morphine per pt   Depression    Diverticulosis    Dysrhythmia    Atrial fibrillation   Fatigue    Frequency of urination    GERD (gastroesophageal reflux disease)    History of colon polyps    History of DVT of lower extremity    POST LEFT TOTAL KNEE  1996   History of hiatal hernia    History of iron deficiency anemia 1996   iron infusion   History of migraine headaches    History of radiation therapy    left breast 03/27/2021-04/23/2021 Dr [name]   History of rib fracture    Hyperlipidemia    Hypertension    IBS (irritable bowel syndrome)    Insomnia    Joint pain    MCI (mild cognitive impairment)    Melanoma ([place]) 2019   left leg    Migraine headache    hx of migraines    OA (osteoarthritis)    RIGHT SHOULDER   Osteopenia    Pneumonia    remote history   PONV (postoperative nausea and vomiting)    Recovering  alcoholic ([place])    SINCE [id number]   Right rotator cuff tear    SOB (shortness of breath) on exertion    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   Swallowing difficulty    Unspecified essential hypertension    Past Surgical History:  Procedure Laterality Date   BREAST BIOPSY Left 01/06/2021   pos   BREAST LUMPECTOMY WITH RADIOACTIVE SEED LOCALIZATION Left 02/27/2021   Procedure: LEFT BREAST LUMPECTOMY WITH RADIOACTIVE SEED LOCALIZATION;  Surgeon: [name], MD;  Location: [facility];  Service: General;  Laterality: Left;   BUNIONECTOMY/  HAMMERTOE CORRECTION  RIGHT FOOT  2011   CATARACT EXTRACTION W/ INTRAOCULAR LENS  IMPLANT, BILATERAL     COLONOSCOPY     DILATION AND CURETTAGE OF UTERUS  1976   EYE SURGERY Bilateral 2016   cataract removal   KNEE ARTHROSCOPY W/ MENISCECTOMY Bilateral X2  LEFT /    X1  RIGHT   KNEE OPEN LATERAL RELEASE Bilateral    MOHS SURGERY Left 12/15/2017    Melanoma in situ - left calf - [facility]   ORIF ANKLE FRACTURE Right 08/04/2020   Procedure: OPEN REDUCTION INTERNAL FIXATION (ORIF) ANKLE FRACTURE;  Surgeon: Wylene Simmer, MD;  Location: [facility];  Service: Orthopedics;  Laterality: Right;  Mini C-arm, [employer] Small Frag   REPLACEMENT TOTAL KNEE Left 2006   SHOULDER ARTHROSCOPY WITH SUBACROMIAL DECOMPRESSION, ROTATOR CUFF REPAIR AND BICEP TENDON REPAIR Right 05/23/2013   Procedure: RIGHT SHOULDER ARTHROSCOPY EXAM UNDER ANESTHESIA  WITH SUBACROMIAL DECOMPRESSION,DISTAL CLAVICLE RESECTION, SADLABRAL DEBRIDEMENT CHONDROPLASTY, BICEP TENOTOMY ;  Surgeon: Sydnee Cabal, MD;  Location: [facility];  Service: Orthopedics;  Laterality: Right;   TONSILLECTOMY AND ADENOIDECTOMY  AGE 89   TOTAL HIP ARTHROPLASTY Left 05/04/2017   Procedure: LEFT TOTAL HIP ARTHROPLASTY ANTERIOR APPROACH;  Surgeon: Paralee Cancel, MD;  Location: [facility];  Service: Orthopedics;  Laterality: Left;   TOTAL HIP ARTHROPLASTY Right 08/01/2019   Procedure: TOTAL HIP ARTHROPLASTY ANTERIOR APPROACH;  Surgeon: Paralee Cancel, MD;  Location: [facility];  Service: Orthopedics;  Laterality: Right;  103mns   TOTAL KNEE ARTHROPLASTY Bilateral LEFT  1996/   RIGHT 2004   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     Antiseborrheic Products, Misc. (PROMISEB) CREA Uses topical daily as needed on her face     Ascorbic Acid (VITAMIN C) 1000 MG tablet Take 1,000 mg by mouth daily.     atorvastatin (LIPITOR) 40 MG tablet Take 1 tablet (40 mg total) by mouth daily. 90 tablet 3   betamethasone dipropionate 0.05 % lotion APPLY TOPICALLY TO AFFECTED AREA EVERY DAY 60 mL 1   ELIQUIS 5 MG TABS tablet TAKE 1 TABLET BY MOUTH  TWICE A DAY 180 tablet 1   famotidine (PEPCID) 20 MG tablet Take 1 tablet (20 mg total) by mouth daily as needed for heartburn or indigestion. (Patient taking differently: Take 20 mg by mouth daily.) 90 tablet 3   FIBER PO Take 1 capsule by mouth daily. Pt takes in the evening.     HYDROcodone-acetaminophen (NORCO/VICODIN) 5-325 MG tablet 1 tablet as needed     hydrocortisone cream 1 % Apply 1 application topically daily  as needed for itching.     L-Methylfolate-B12-B6-B2 (CEREFOLIN) 07-17-48-5 MG TABS Take 1 tablet by mouth daily. 90 tablet 1   losartan (COZAAR) 100 MG tablet TAKE 1 TABLET BY MOUTH EVERY DAY 90 tablet 2   metFORMIN (GLUCOPHAGE) 500 MG tablet Take 1 tablet (500 mg total) by mouth 2 (two) times daily with a meal. 60 tablet 0   metoprolol tartrate (LOPRESSOR) 25 MG tablet Take 1 tablet (25 mg total) by mouth 2 (two) times daily. 180 tablet 3   omeprazole (PRILOSEC) 40 MG capsule TAKE 1 CAPSULE (40 MG TOTAL) BY MOUTH DAILY. 90 capsule 1   rizatriptan (MAXALT) 10 MG tablet Take 1 tablet (10 mg total) by mouth as needed for migraine. May repeat in 2 hours if needed 10 tablet 2   sulfamethoxazole-trimethoprim (BACTRIM,SEPTRA) 400-80 MG tablet Take 1 tablet by mouth daily. For urethritis.     traZODone (DESYREL) 50 MG tablet TAKE 1 TABLET BY MOUTH EVERYDAY AT BEDTIME 90 tablet 1   venlafaxine XR (EFFEXOR-XR) 37.5 MG 24 hr capsule Take 1 capsule (37.5 mg total) by mouth daily with breakfast. 90 capsule 0   Vitamin D, Ergocalciferol, (DRISDOL) 1.25 MG (50000 UNIT) CAPS capsule Take 1 capsule (50,000 Units total) by mouth every 7 (seven) days. 4 capsule 0   tamoxifen (NOLVADEX) 10 MG tablet Take 1/2 tablet by mouth in the evening     No current facility-administered medications for this encounter.    Allergies  Allergen Reactions   Nsaids Other (See Comments) and Anaphylaxis    SEVERE STOMACH CRAMPS, MOUTH SORES  **Able to tolerate Tylenol   Otezla [Apremilast] Anaphylaxis    Suicidal  ideation   Penicillins Anaphylaxis and Other (See Comments)    Has patient had a PCN reaction causing immediate rash, facial/tongue/throat swelling, SOB or lightheadedness with hypotension:  Yes  Has patient had a PCN reaction causing severe rash involving mucus membranes or skin necrosis: Yes  Has patient had a PCN reaction that required hospitalization: No  Has patient had a PCN reaction occurring within the last 10 year No  If all of the above answers are "NO", then may proceed with Cephalosporin use.   Erythromycin Other (See Comments)    SEVERE STOMACH CRAMPS   Morphine And Related Nausea And Vomiting   Penicillamine Other (See Comments)   Remicade [Infliximab] Other (See Comments)    Shut down immune system-BP high   Risankizumab Other (See Comments)    URI, fever, cough, UTI   Tolmetin     SEVERE STOMACH CRAMPS   Nickel Rash    Including snaps on hospital gowns     Social History   Socioeconomic History   Marital status: Widowed    Spouse name: Not on file   Number of children: 3   Years of education: Not on file   Highest education level: Master's degree (e.g., MA, MS, MEng, MEd, MSW, MBA)  Occupational History   Occupation: retired Transport planner  Tobacco Use   Smoking status: Former    Packs/day: 0.50    Years: 15.00    Total pack years: 7.50    Types: Cigarettes    Quit date: 05/15/1985    Years since quitting: 36.8   Smokeless tobacco: Never  Vaping Use   Vaping Use: Never used  Substance and Sexual Activity   Alcohol use: No    Comment: RECOVERING ALCOHOLIC--  QUIT [id number]   Drug use: No   Sexual activity: Not Currently    Birth control/protection: Post-menopausal  Other Topics Concern   Not on file  Social History Narrative   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    Widowed    Three kids    64 grandchildren       Social Determinants of Health   Financial Resource Strain: Low Risk  (04/18/2021)   Overall  Financial Resource Strain (CARDIA)    Difficulty of Paying Living Expenses: Not hard at all  Food Insecurity: No Food Insecurity (04/18/2021)   Hunger Vital Sign    Worried About Running Out of Food in the Last Year: Never true    [place] in the Last Year: Never true  Transportation Needs: No Transportation Needs (04/18/2021)   PRAPARE - Hydrologist (Medical): No    Lack of Transportation (Non-Medical): No  Physical Activity: Inactive (04/18/2021)   Exercise Vital Sign    Days of Exercise per Week: 0 days    Minutes of Exercise per Session: 0 min  Stress: No Stress Concern Present (04/18/2021)   [place]    Feeling of Stress : Not at all  Social Connections: Moderately Integrated (04/18/2021)   Social Connection and Isolation Panel [NHANES]    Frequency of Communication with Friends and Family: More than three times a week    Frequency of Social Gatherings with Friends and Family: More than three times a week    Attends Religious Services: More than 4 times per year    Active Member of [employer] or Organizations: Yes    Attends Archivist Meetings: More than 4 times per year    Marital Status: Widowed  Intimate Partner Violence: Not At Risk (04/18/2021)   Humiliation, Afraid, Rape, and Kick questionnaire    Fear of Current or Ex-Partner: No    Emotionally Abused: No    Physically Abused: No    Sexually Abused: No    Family History  Problem Relation Age of Onset   Cancer Mother    Heart disease Mother    Hypertension Mother    Melanoma Mother 88   Obesity Mother    Heart disease Father 77   Hypertension Father    Esophageal cancer Brother    Dementia Paternal Grandfather    Melanoma Niece 72   Colon cancer Neg Hx    Colon polyps Neg Hx    Stomach cancer Neg Hx    Rectal cancer Neg Hx     ROS- All systems are reviewed and negative except as per the HPI above  Physical  Exam: Vitals:   03/17/22 1147  Pulse: (!) 49  Weight: 92.3 kg  Height: '5\' 6"'$  (1.676 m)   Wt Readings from Last 3 Encounters:  03/17/22 92.3 kg  03/05/22 90.3 kg  03/03/22 92.4 kg    Labs: Lab Results  Component Value Date   NA 141 03/03/2022   K 4.2 03/03/2022   CL 105 03/03/2022   CO2 23 03/03/2022   GLUCOSE 99 03/03/2022   BUN 8 03/03/2022   CREATININE 0.67 03/03/2022   CALCIUM 9.3 03/03/2022   Lab Results  Component Value Date   INR 1.0 07/12/2007   Lab Results  Component Value Date   CHOL 147 01/29/2022   HDL 61 01/29/2022   LDLCALC 70 01/29/2022   TRIG 84 01/29/2022     GEN- The patient is well appearing, alert and oriented x 3 today.   Head- normocephalic,  atraumatic Eyes-  Sclera clear, conjunctiva pink Ears- hearing intact Oropharynx- clear Neck- supple, no JVP Lymph- no cervical lymphadenopathy Lungs- Clear to ausculation bilaterally, normal work of breathing Heart- Slow regular rate and rhythm, no murmurs, rubs or gallops, PMI not laterally displaced GI- soft, NT, ND, + BS Extremities- no clubbing, cyanosis, or edema MS- no significant deformity or atrophy Skin- no rash or lesion Psych- euthymic mood, full affect Neuro- strength and sensation are intact  EKG- Vent. rate 49 BPM PR interval 168 ms QRS duration 84 ms QT/QTcB 454/410 ms P-R-T axes 33 13 41 Sinus bradycardia Otherwise normal ECG When compared with ECG of 13-Mar-2021 15:06, PREVIOUS ECG IS PRESENT No significant change since last tracing Confirmed by Kirk Ruths [phone]) on 03/17/2022 2:08:20 PM    Assessment and Plan:  1. Paroxysmal afib   Maintaining SR  from onset December 2022 until recent increase in afib burden  Pt noticed  recent increase of  erratic heart rate episodes during the day but this resolved with metoprolol increase to 25 mg bid  For now, she is happy with low afib burden but would like to see EP re ablation  We discussed Multaq but already goes into  donut hole late summer with Eliquis   2. CHA2DS2VASc  score of 4 Continue eliquis 5 mg bid  Pt voices concern re going into donut hole and the cost of drug for 4 months last year For that reason, she is interested in a Watchman device   3. Asymptomatic bradycardia Continue to monitor with apple watch at home   I will refer to Dr. Quentin Ore to discuss both    Geroge Baseman. Damiean Lukes, [facility] [address] [place], [place] [address] [phone]

## 2022-03-18 ENCOUNTER — Other Ambulatory Visit: Payer: Self-pay

## 2022-03-18 MED ORDER — CEREFOLIN 6-1-50-5 MG PO TABS: 1.0000 | ORAL_TABLET | Freq: Every day | ORAL | 1 refills | Status: DC

## 2022-03-23 ENCOUNTER — Other Ambulatory Visit: Payer: Self-pay | Admitting: Gastroenterology

## 2022-03-26 ENCOUNTER — Encounter (HOSPITAL_COMMUNITY): Payer: Self-pay | Admitting: *Deleted

## 2022-03-27 ENCOUNTER — Encounter (HOSPITAL_COMMUNITY): Payer: Self-pay | Admitting: Psychiatry

## 2022-03-27 ENCOUNTER — Telehealth (INDEPENDENT_AMBULATORY_CARE_PROVIDER_SITE_OTHER): Payer: Medicare Other | Admitting: Psychiatry

## 2022-03-27 DIAGNOSIS — F331 Major depressive disorder, recurrent, moderate: Secondary | ICD-10-CM | POA: Diagnosis not present

## 2022-03-27 DIAGNOSIS — F1021 Alcohol dependence, in remission: Secondary | ICD-10-CM | POA: Diagnosis not present

## 2022-03-27 DIAGNOSIS — F5102 Adjustment insomnia: Secondary | ICD-10-CM | POA: Diagnosis not present

## 2022-03-27 DIAGNOSIS — R931 Abnormal findings on diagnostic imaging of heart and coronary circulation: Secondary | ICD-10-CM

## 2022-03-27 MED ORDER — VENLAFAXINE HCL ER 37.5 MG PO CP24: 37.5000 mg | ORAL_CAPSULE | Freq: Every day | ORAL | 0 refills | Status: DC

## 2022-03-27 NOTE — Progress Notes (Signed)
[facility] visit Tele psych  Patient Identification: Tonya Bartlett MRN:  [id number] Date of Evaluation:  03/27/2022 Referral Source: NP Chief Complaint:   Depression follow up  Visit Diagnosis:    ICD-10-CM   1. Major depressive disorder, recurrent episode, moderate (HCC)  F33.1     2. Adjustment insomnia  F51.02       Virtual Visit via Video Note  I connected with Tonya Bartlett on 03/27/22 at 10:00 AM EST by a video enabled telemedicine application and verified that I am speaking with the correct person using two identifiers.  Location: Patient: home Provider: home office   I discussed the limitations of evaluation and management by telemedicine and the availability of in person appointments. The patient expressed understanding and agreed to proceed.     I discussed the assessment and treatment plan with the patient. The patient was provided an opportunity to ask questions and all were answered. The patient agreed with the plan and demonstrated an understanding of the instructions.   The patient was advised to call back or seek an in-person evaluation if the symptoms worsen or if the condition fails to improve as anticipated.  I provided 15 minutes of non-face-to-face time during this encounter.     History of Present Illness: 77 years old currently widowed white female lives by herself has 3 grown kids and 6 grandkids referred initially by family medicine for management of depression   Remains sober 40 years plus and active in [place] Has had breast surgery daignosed with cancer, recovering, on radiation.   Feels amotivated, somewhat sleepy during the day and some days subdued Anxiety is manageable   Modifying factors :family,  daughter is now communicating aggravating factors. Hip surgery, breast surgery, medical conditions Severity subdued  Past Psychiatric History: depression  Previous Psychotropic Medications: Yes   Substance Abuse History in the last 12  months:  No.  Consequences of Substance Abuse: NA  Past Medical History:  Past Medical History:  Diagnosis Date   Alcohol abuse    Sober 41 years.   Allergic rhinitis    Allergy    Anemia    Anxiety    Blood transfusion without reported diagnosis    Breast cancer ([place]) 12/2020   left   Cancer ([place])    Cataract    bil cataracts removed   Clotting disorder ([place])    DVT- 1996 po knee replacement   Colitis    Complication of anesthesia    vomit x1 while on Morphine per pt   Depression    Diverticulosis    Dysrhythmia    Atrial fibrillation   Fatigue    Frequency of urination    GERD (gastroesophageal reflux disease)    History of colon polyps    History of DVT of lower extremity    POST LEFT TOTAL KNEE  1996   History of hiatal hernia    History of iron deficiency anemia 1996   iron infusion   History of migraine headaches    History of radiation therapy    left breast 03/27/2021-04/23/2021 Dr [name]   History of rib fracture    Hyperlipidemia    Hypertension    IBS (irritable bowel syndrome)    Insomnia    Joint pain    MCI (mild cognitive impairment)    Melanoma ([place]) 2019   left leg    Migraine headache    hx of migraines    OA (osteoarthritis)    RIGHT SHOULDER  Osteopenia    Pneumonia    remote history   PONV (postoperative nausea and vomiting)    Recovering alcoholic ([place])    SINCE [id number]   Right rotator cuff tear    SOB (shortness of breath) on exertion    Substance abuse ([place])    recovering alcoholic   Swallowing difficulty    Unspecified essential hypertension     Past Surgical History:  Procedure Laterality Date   BREAST BIOPSY Left 01/06/2021   pos   BREAST LUMPECTOMY WITH RADIOACTIVE SEED LOCALIZATION Left 02/27/2021   Procedure: LEFT BREAST LUMPECTOMY WITH RADIOACTIVE SEED LOCALIZATION;  Surgeon: [name], MD;  Location: [place];  Service: General;  Laterality: Left;   BUNIONECTOMY/  HAMMERTOE CORRECTION  RIGHT FOOT  2011    CATARACT EXTRACTION W/ INTRAOCULAR LENS  IMPLANT, BILATERAL     COLONOSCOPY     DILATION AND CURETTAGE OF UTERUS  1976   EYE SURGERY Bilateral 2016   cataract removal   KNEE ARTHROSCOPY W/ MENISCECTOMY Bilateral X2  LEFT /    X1  RIGHT   KNEE OPEN LATERAL RELEASE Bilateral    MOHS SURGERY Left 12/15/2017   Melanoma in situ - left calf - [facility]   ORIF ANKLE FRACTURE Right 08/04/2020   Procedure: OPEN REDUCTION INTERNAL FIXATION (ORIF) ANKLE FRACTURE;  Surgeon: Wylene Simmer, MD;  Location: [facility];  Service: Orthopedics;  Laterality: Right;  Mini C-arm, [employer] Small Frag   REPLACEMENT TOTAL KNEE Left 2006   SHOULDER ARTHROSCOPY WITH SUBACROMIAL DECOMPRESSION, ROTATOR CUFF REPAIR AND BICEP TENDON REPAIR Right 05/23/2013   Procedure: RIGHT SHOULDER ARTHROSCOPY EXAM UNDER ANESTHESIA  WITH SUBACROMIAL DECOMPRESSION,DISTAL CLAVICLE RESECTION, SADLABRAL DEBRIDEMENT CHONDROPLASTY, BICEP TENOTOMY ;  Surgeon: Sydnee Cabal, MD;  Location: [place];  Service: Orthopedics;  Laterality: Right;   TONSILLECTOMY AND ADENOIDECTOMY  AGE 32   TOTAL HIP ARTHROPLASTY Left 05/04/2017   Procedure: LEFT TOTAL HIP ARTHROPLASTY ANTERIOR APPROACH;  Surgeon: Paralee Cancel, MD;  Location: [facility];  Service: Orthopedics;  Laterality: Left;   TOTAL HIP ARTHROPLASTY Right 08/01/2019   Procedure: TOTAL HIP ARTHROPLASTY ANTERIOR APPROACH;  Surgeon: Paralee Cancel, MD;  Location: [facility];  Service: Orthopedics;  Laterality: Right;  87mns   TOTAL KNEE ARTHROPLASTY Bilateral LEFT  1996/   RIGHT 2004   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      UPPER GASTROINTESTINAL ENDOSCOPY     UPPER GI ENDOSCOPY     VAGINAL HYSTERECTOMY  1976    Family Psychiatric History: mom possible depression  Family History:  Family History  Problem Relation Age of Onset   Cancer Mother    Heart disease Mother    Hypertension Mother    Melanoma Mother 872  Obesity Mother    Heart disease Father 675  Hypertension  Father    Esophageal cancer Brother    Dementia Paternal Grandfather    Melanoma Niece 187  Colon cancer Neg Hx    Colon polyps Neg Hx    Stomach cancer Neg Hx    Rectal cancer Neg Hx     Social History:   Social History   Socioeconomic History   Marital status: Widowed    Spouse name: Not on file   Number of children: 3   Years of education: Not on file   Highest education level: Master's degree (e.g., MA, MS, MEng, MEd, MSW, MBA)  Occupational History   Occupation: retired tTransport planner Tobacco Use   Smoking status: Former  Packs/day: 0.50    Years: 15.00    Total pack years: 7.50    Types: Cigarettes    Quit date: 05/15/1985    Years since quitting: 36.8   Smokeless tobacco: Never  Vaping Use   Vaping Use: Never used  Substance and Sexual Activity   Alcohol use: No    Comment: RECOVERING ALCOHOLIC--  QUIT [id number]   Drug use: No   Sexual activity: Not Currently    Birth control/protection: Post-menopausal  Other Topics Concern   Not on file  Social History Narrative   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    Widowed    Three kids    16 grandchildren       Social Determinants of Health   Financial Resource Strain: Low Risk  (04/18/2021)   Overall Financial Resource Strain (CARDIA)    Difficulty of Paying Living Expenses: Not hard at all  Food Insecurity: No Food Insecurity (04/18/2021)   Hunger Vital Sign    Worried About Running Out of Food in the Last Year: Never true    [place] in the Last Year: Never true  Transportation Needs: No Transportation Needs (04/18/2021)   PRAPARE - Hydrologist (Medical): No    Lack of Transportation (Non-Medical): No  Physical Activity: Inactive (04/18/2021)   Exercise Vital Sign    Days of Exercise per Week: 0 days    Minutes of Exercise per Session: 0 min  Stress: No Stress Concern Present (04/18/2021)   [place]    Feeling of Stress : Not at all  Social Connections: Moderately Integrated (04/18/2021)   Social Connection and Isolation Panel [NHANES]    Frequency of Communication with Friends and Family: More than three times a week    Frequency of Social Gatherings with Friends and Family: More than three times a week    Attends Religious Services: More than 4 times per year    Active Member of [employer] or Organizations: Yes    Attends Archivist Meetings: More than 4 times per year    Marital Status: Widowed     Allergies:   Allergies  Allergen Reactions   Nsaids Other (See Comments) and Anaphylaxis    SEVERE STOMACH CRAMPS, MOUTH SORES  **Able to tolerate Tylenol   Otezla [Apremilast] Anaphylaxis    Suicidal ideation   Penicillins Anaphylaxis and Other (See Comments)    Has patient had a PCN reaction causing immediate rash, facial/tongue/throat swelling, SOB or lightheadedness with hypotension:  Yes  Has patient had a PCN reaction causing severe rash involving mucus membranes or skin necrosis: Yes  Has patient had a PCN reaction that required hospitalization: No  Has patient had a PCN reaction occurring within the last 10 year No  If all of the above answers are "NO", then may proceed with Cephalosporin use.   Erythromycin Other (See Comments)    SEVERE STOMACH CRAMPS   Morphine And Related Nausea And Vomiting   Penicillamine Other (See Comments)   Remicade [Infliximab] Other (See Comments)    Shut down immune system-BP high   Risankizumab Other (See Comments)    URI, fever, cough, UTI   Tolmetin     SEVERE STOMACH CRAMPS   Nickel Rash    Including snaps on hospital gowns     Metabolic Disorder Labs: Lab Results  Component Value Date   HGBA1C 5.3 01/29/2022  No results found for: "PROLACTIN" Lab Results  Component Value Date   CHOL 147 01/29/2022   TRIG 84 01/29/2022   HDL 61 01/29/2022   CHOLHDL 2.7 06/11/2021   VLDL 13.8  04/24/2021   LDLCALC 70 01/29/2022   LDLCALC 90 06/11/2021     Current Medications: Current Outpatient Medications  Medication Sig Dispense Refill   acetaminophen (TYLENOL) 325 MG tablet Take 650 mg by mouth every 6 (six) hours as needed for moderate pain.     Antiseborrheic Products, Misc. (PROMISEB) CREA Uses topical daily as needed on her face     Ascorbic Acid (VITAMIN C) 1000 MG tablet Take 1,000 mg by mouth daily.     atorvastatin (LIPITOR) 40 MG tablet Take 1 tablet (40 mg total) by mouth daily. 90 tablet 3   betamethasone dipropionate 0.05 % lotion APPLY TOPICALLY TO AFFECTED AREA EVERY DAY 60 mL 1   ELIQUIS 5 MG TABS tablet TAKE 1 TABLET BY MOUTH TWICE A DAY 180 tablet 1   famotidine (PEPCID) 20 MG tablet Take 1 tablet (20 mg total) by mouth daily as needed for heartburn or indigestion. (Patient taking differently: Take 20 mg by mouth daily.) 90 tablet 3   FIBER PO Take 1 capsule by mouth daily. Pt takes in the evening.     HYDROcodone-acetaminophen (NORCO/VICODIN) 5-325 MG tablet 1 tablet as needed     hydrocortisone cream 1 % Apply 1 application topically daily as needed for itching.     L-Methylfolate-B12-B6-B2 (CEREFOLIN) 07-17-48-5 MG TABS Take 1 tablet by mouth daily. 90 tablet 1   losartan (COZAAR) 100 MG tablet TAKE 1 TABLET BY MOUTH EVERY DAY 90 tablet 2   metFORMIN (GLUCOPHAGE) 500 MG tablet Take 1 tablet (500 mg total) by mouth 2 (two) times daily with a meal. 60 tablet 0   metoprolol tartrate (LOPRESSOR) 25 MG tablet Take 1 tablet (25 mg total) by mouth 2 (two) times daily. 180 tablet 3   omeprazole (PRILOSEC) 40 MG capsule TAKE 1 CAPSULE (40 MG TOTAL) BY MOUTH DAILY. 90 capsule 2   rizatriptan (MAXALT) 10 MG tablet Take 1 tablet (10 mg total) by mouth as needed for migraine. May repeat in 2 hours if needed 10 tablet 2   sulfamethoxazole-trimethoprim (BACTRIM,SEPTRA) 400-80 MG tablet Take 1 tablet by mouth daily. For urethritis.     tamoxifen (NOLVADEX) 10 MG tablet Take  1/2 tablet by mouth in the evening     traZODone (DESYREL) 50 MG tablet TAKE 1 TABLET BY MOUTH EVERYDAY AT BEDTIME 90 tablet 1   venlafaxine XR (EFFEXOR-XR) 37.5 MG 24 hr capsule Take 1 capsule (37.5 mg total) by mouth daily. 90 capsule 0   Vitamin D, Ergocalciferol, (DRISDOL) 1.25 MG (50000 UNIT) CAPS capsule Take 1 capsule (50,000 Units total) by mouth every 7 (seven) days. 4 capsule 0   No current facility-administered medications for this visit.     Psychiatric Specialty Exam: Review of Systems  Cardiovascular:  Negative for chest pain.  Psychiatric/Behavioral:  Negative for depression and suicidal ideas.     There were no vitals taken for this visit.There is no height or weight on file to calculate BMI.  General Appearance: casual  Eye Contact:  fair  Speech:  Normal Rate  Volume:  Decreased  Mood: feels tired or amotivated  Affect: congruent  Thought Process:  Goal Directed  Orientation:  Full (Time, Place, and Person)  Thought Content:  Rumination  Suicidal Thoughts:  No  Homicidal Thoughts:  No  Memory:  Immediate;   Fair Recent;   Fair  Judgement:  Fair  Insight:  Fair  Psychomotor Activity:  Normal  Concentration:  Concentration: Fair and Attention Span: Fair  Recall:  [employer] of Knowledge:Good  Language: Good  Akathisia:  No  Handed:  Right  AIMS (if indicated):    Assets:  Desire for Improvement  ADL's:  Intact  Cognition: Impaired,  Mild  Sleep:  Variable to fair on meds    Treatment Plan Summary: Medication management and Plan as follows   Prior documentation reviewed  1. MDD, mild to moderate: subdued or tired, change effexor to night, if its contributing to tiredness. Second step to call if no improvement and will increase effexor 2. Insomnia: fair on trazadone, discussed to avoid bed during the day Alcohol dependence:sustained remission, continue to attend AA  Fu 31m     NMerian Capron MD 2/9/202410:11 AM

## 2022-04-02 ENCOUNTER — Other Ambulatory Visit (INDEPENDENT_AMBULATORY_CARE_PROVIDER_SITE_OTHER): Payer: Self-pay | Admitting: Family Medicine

## 2022-04-02 DIAGNOSIS — R7303 Prediabetes: Secondary | ICD-10-CM

## 2022-04-07 ENCOUNTER — Other Ambulatory Visit: Payer: Self-pay | Admitting: Adult Health

## 2022-04-09 ENCOUNTER — Ambulatory Visit (INDEPENDENT_AMBULATORY_CARE_PROVIDER_SITE_OTHER): Payer: Medicare Other | Admitting: Family Medicine

## 2022-04-09 ENCOUNTER — Encounter (INDEPENDENT_AMBULATORY_CARE_PROVIDER_SITE_OTHER): Payer: Self-pay | Admitting: Family Medicine

## 2022-04-09 VITALS — BP 116/77 | HR 99 | Temp 97.7°F | Ht 66.0 in | Wt 197.0 lb

## 2022-04-09 DIAGNOSIS — Z6831 Body mass index (BMI) 31.0-31.9, adult: Secondary | ICD-10-CM

## 2022-04-09 DIAGNOSIS — E559 Vitamin D deficiency, unspecified: Secondary | ICD-10-CM | POA: Diagnosis not present

## 2022-04-09 DIAGNOSIS — R7303 Prediabetes: Secondary | ICD-10-CM

## 2022-04-09 DIAGNOSIS — Z6832 Body mass index (BMI) 32.0-32.9, adult: Secondary | ICD-10-CM | POA: Insufficient documentation

## 2022-04-09 DIAGNOSIS — E669 Obesity, unspecified: Secondary | ICD-10-CM

## 2022-04-09 MED ORDER — VITAMIN D (ERGOCALCIFEROL) 1.25 MG (50000 UNIT) PO CAPS: 50000.0000 [IU] | ORAL_CAPSULE | ORAL | 0 refills | Status: DC

## 2022-04-09 MED ORDER — METFORMIN HCL 500 MG PO TABS: 500.0000 mg | ORAL_TABLET | Freq: Two times a day (BID) | ORAL | 0 refills | Status: DC

## 2022-04-20 NOTE — Progress Notes (Deleted)
Electrophysiology Office Note:    Date:  04/20/2022   ID:  Tonya Bartlett, DOB 1945-07-17, MRN [id number]  Why Cardiologist:  Quay Burow, MD  [facility] Electrophysiologist:  Vickie Epley, MD   Referring MD: [name], NP   Chief Complaint: AF  History of Present Illness:    Tonya Bartlett is a 77 y.o. female who I am seeing today for an opinion regarding AF at the request of Roderic Palau. Diagnosis of AF ~ 1 year ago. Her AF burden has increased and she presents to discuss possible ablation and watchman given her desire to avoig long term bleeding risk and costs associated with [place]. She has a history of diverticulosis and colitis and IBS.           Their past medical, social and family history was reveiwed.   ROS:   Please see the history of present illness.    All other systems reviewed and are negative.  EKGs/Labs/Other Studies Reviewed:    The following studies were reviewed today:  11/2020 Echo EF73%, RV normal, lA dilation.   03/17/2022 ECG shows sinus, QTc 463m.  Physical Exam:    VS:  There were no vitals taken for this visit.    Wt Readings from Last 3 Encounters:  04/09/22 197 lb (89.4 kg)  03/17/22 203 lb 6.4 oz (92.3 kg)  03/05/22 199 lb (90.3 kg)     GEN: *** Well nourished, well developed in no acute distress CARDIAC: ***RRR, no murmurs, rubs, gallops RESPIRATORY:  Clear to auscultation without rales, wheezing or rhonchi       ASSESSMENT AND PLAN:    1. PAF (paroxysmal atrial fibrillation) ([place]   2. Essential hypertension, benign     #pAF Increasing burden. Symptomatic.  On Eliquis but wishes to avoid long term exposure given increased risk of bleeding with diverticulosis/colitis.   Discussed treatment options today for their AF including antiarrhythmic drug therapy and ablation. Discussed risks, recovery and likelihood of success. Discussed potential need for repeat ablation procedures and antiarrhythmic  drugs after an initial ablation. They wish to proceed with scheduling.  Risk, benefits, and alternatives to EP study and radiofrequency ablation for afib were also discussed in detail today. These risks include but are not limited to stroke, bleeding, vascular damage, tamponade, perforation, damage to the esophagus, lungs, and other structures, pulmonary vein stenosis, worsening renal function, and death. The patient understands these risk and wishes to proceed.  We will therefore proceed with catheter ablation at the next available time.  Carto, ICE, anesthesia are requested for the procedure.  Will also obtain CT PV protocol prior to the procedure to exclude LAA thrombus and further evaluate atrial anatomy.  ------------  I have seen Tonya Bartlett in the office today who is being considered for a Watchman left atrial appendage closure device. I believe they will benefit from this procedure given their history of atrial fibrillation, CHA2DS2-VASc score of 5 and unadjusted ischemic stroke rate of 7.5% per year. Unfortunately, the patient is not felt to be a long term anticoagulation candidate secondary to increased risk of bleeding due to history of diverticulosis/colitis. The patient's chart has been reviewed and I feel that they would be a candidate for short term oral anticoagulation after Watchman implant.   It is my belief that after undergoing a LAA closure procedure, Tonya MISQUEZwill not need long term anticoagulation which eliminates anticoagulation side effects and major bleeding risk.   Procedural risks for the Watchman  implant have been reviewed with the patient including a 0.5% risk of stroke, <1% risk of perforation and <1% risk of device embolization. Other risks include bleeding, vascular damage, tamponade, worsening renal function, and death. The patient understands these risk and wishes to proceed.     The published clinical data on the safety and effectiveness of WATCHMAN include  but are not limited to the following: - Holmes DR, Mechele Claude, Sick P et al. for the PROTECT AF Investigators. Percutaneous closure of the left atrial appendage versus warfarin therapy for prevention of stroke in patients with atrial fibrillation: a randomised non-inferiority trial. Lancet 2009; 374: 534-42. Mechele Claude, Doshi SK, Abelardo Diesel D et al. on behalf of the PROTECT AF Investigators. Percutaneous Left Atrial Appendage Closure for Stroke Prophylaxis in Patients With Atrial Fibrillation 2.3-Year Follow-up of the PROTECT AF (Watchman Left Atrial Appendage System for Embolic Protection in Patients With Atrial Fibrillation) Trial. Circulation 2013; 127:720-729. - Alli O, Doshi S,  Kar S, Reddy VY, Sievert H et al. Quality of Life Assessment in the Randomized PROTECT AF (Percutaneous Closure of the Left Atrial Appendage Versus Warfarin Therapy for Prevention of Stroke in Patients With Atrial Fibrillation) Trial of Patients at Risk for Stroke With Nonvalvular Atrial Fibrillation. J Am Coll Cardiol 2013; P4788364. Vertell Limber DR, Tarri Abernethy, Price M, [place], Sievert H, Doshi S, Huber K, Reddy V. Prospective randomized evaluation of the Watchman left atrial appendage Device in patients with atrial fibrillation versus long-term warfarin therapy; the PREVAIL trial. Journal of the [employer] of Cardiology, Vol. 4, No. 1, 2014, 1-11. - Kar S, Doshi SK, Sadhu A, Horton R, Osorio J et al. Primary outcome evaluation of a next-generation left atrial appendage closure device: results from the PINNACLE FLX trial. Circulation 2021;143(18)1754-1762.    After today's visit with the patient which was dedicated solely for shared decision making visit regarding LAA closure device, the patient decided to proceed with the LAA appendage closure procedure scheduled to be done in the near future at [facility]. Prior to the procedure, I would like to obtain a gated CT scan of the chest with contrast timed for  PV/LA visualization.   HAS-BLED score 2 Hypertension Yes  Abnormal renal and liver function (Dialysis, transplant, Cr >2.26 mg/dL /Cirrhosis or Bilirubin >2x Normal or AST/ALT/AP >3x Normal) No  Stroke No  Bleeding No  Labile INR (Unstable/high INR) No  Elderly (>65) Yes  Drugs or alcohol (? 8 drinks/week, anti-plt or NSAID) No   CHA2DS2-VASc Score = 5  The patient's score is based upon: CHF History: 0 HTN History: 1 Diabetes History: 0 Stroke History: 0 Vascular Disease History: 1 Age Score: 2 Gender Score: 1           Signed, Lysbeth Galas T. Quentin Ore, MD, [facility], [facility] 04/20/2022 9:24 PM    Electrophysiology [facility]

## 2022-04-21 ENCOUNTER — Telehealth (INDEPENDENT_AMBULATORY_CARE_PROVIDER_SITE_OTHER): Payer: Medicare Other | Admitting: Family Medicine

## 2022-04-21 ENCOUNTER — Ambulatory Visit: Payer: Medicare Other | Admitting: Cardiology

## 2022-04-21 DIAGNOSIS — I480 Paroxysmal atrial fibrillation: Secondary | ICD-10-CM

## 2022-04-21 DIAGNOSIS — Z0000 Encounter for general adult medical examination without abnormal findings: Secondary | ICD-10-CM | POA: Diagnosis not present

## 2022-04-21 DIAGNOSIS — I10 Essential (primary) hypertension: Secondary | ICD-10-CM

## 2022-04-21 NOTE — Progress Notes (Signed)
PATIENT CHECK-IN and HEALTH RISK ASSESSMENT QUESTIONNAIRE:  -completed by phone/video for upcoming Medicare Preventive Visit  Pre-Visit Check-in: 1)Vitals (height, wt, BP, etc) - record in vitals section for visit on day of visit 2)Review and Update Medications, Allergies PMH, Surgeries, Social history in Epic 3)Hospitalizations in the last year with date/reason? No  4)Review and Update Care Team (patient's specialists) in Epic 5) Complete PHQ9 in Epic  6) Complete Fall Screening in Epic 7)Review all Health Maintenance Due and order under PCP if not done.  8)Medicare Wellness Questionnaire: Answer theses question about your habits: Do you drink alcohol? No  If yes, how many drinks do you have a day? No  Have you ever smoked?Yes  Quit date if applicable?  Jan 03, 1986 How many packs a day do/did you smoke? 10 to 15 per day  Do you use smokeless tobacco?No  Do you use an illicit drugs?No  Do you exercises? Some   IF so, what type and how many days/minutes per week?Walk dogs sometimes depending on weather 15 or 20 min - she also joined the senior center to do group exercise Are you sexually active? No  Number of partners? 0 Typical breakfast: eggs, crackers, grapefruit Typical lunch: crackers, cream cheese, salmon Typical dinner: salmon, salad Typical snacks:vegetables, popcorn  Beverages: water with MIO, 1 cup of coffee  Answer theses question about you: Can you perform most household chores?Yes, most chores, cleaning lady once per month Do you find it hard to follow a conversation in a noisy room? patient wears hearing aid Do you often ask people to speak up or repeat themselves?yes Do you feel that you have a problem with memory?Yes Do you balance your checkbook and or bank acounts?No  Do you feel safe at home?Yes Last dentist visit?4 months ago Do you need assistance with any of the following: Please note if so No assistance needed with below  Driving?  Feeding  yourself?  Getting from bed to chair?  Getting to the toilet?  Bathing or showering?  Dressing yourself?  Managing money?  Climbing a flight of stairs  Preparing meals?  Do you have Advanced Directives in place (Living Will, Healthcare Power or Attorney)? Yes   Last eye Exam and location?2023, [place]   Do you currently use prescribed or non-prescribed narcotic or opioid pain medications?Prn, hydrocodone  Do you have a history or close family history of breast, ovarian, tubal or peritoneal cancer or a family member with BRCA (breast cancer susceptibility 1 and 2) gene mutations?  Nurse/Assistant Credentials/time stamp:  St   ----------------------------------------------------------------------------------------------------------------------------------------------------------------------------------------------------------------------   MEDICARE ANNUAL PREVENTIVE VISIT WITH PROVIDER: (Welcome to [employer], initial annual wellness or annual wellness exam)  Virtual Visit via phone Note  I connected with Dilana Mukherjee [place] on 04/21/22 by phoneand verified that I am speaking with the correct person using two identifiers.  Location patient: home Location provider:work or home office Persons participating in the virtual visit: patient, provider  Concerns and/or follow up today: reports is doing well, has lost over 30 lbs with weight loss clinic. She sees her cardiologist for A. Fib and is doing better. No longer feels palpitations.    See HM section in Epic for other details of completed HM.    ROS: negative for report of fevers, unintentional weight loss, vision changes, vision loss, hearing loss or change, chest pain, sob, hemoptysis, melena, hematochezia, hematuria, genital discharge or lesions, falls, bleeding or bruising, loc, thoughts of suicide or self harm, memory loss  Patient-completed extensive health  risk assessment - reviewed and discussed with the patient: See  Health Risk Assessment completed with patient prior to the visit either above or in recent phone note. This was reviewed in detailed with the patient today and appropriate recommendations, orders and referrals were placed as needed per Summary below and patient instructions.   Review of Medical History: -PMH, [place], Family History and current specialty and care providers reviewed and updated and listed below   Patient Care Team: [name], NP as PCP - General (Family Medicine) [name], MD as PCP - Cardiology (Cardiology) Vickie Epley, MD as PCP - Electrophysiology (Cardiology) [name], MD (Inactive) as Consulting Physician (Urology) Ortho, [facility] (Specialist) [name], [facility] (Inactive) as Pharmacist (Pharmacist) [name], RN as Oncology Nurse Navigator [name], RN as Oncology Nurse Navigator [name], MD as Consulting Physician (General Surgery) [name], MD as Consulting Physician (Hematology and Oncology) [name], MD as Consulting Physician (Radiation Oncology)   Past Medical History:  Diagnosis Date   Alcohol abuse    Sober 41 years.   Allergic rhinitis    Allergy    Anemia    Anxiety    Blood transfusion without reported diagnosis    Breast cancer ([place]) 12/2020   left   Cancer ([place])    Cataract    bil cataracts removed   Clotting disorder ([place])    DVT- 1996 po knee replacement   Colitis    Complication of anesthesia    vomit x1 while on Morphine per pt   Depression    Diverticulosis    Dysrhythmia    Atrial fibrillation   Fatigue    Frequency of urination    GERD (gastroesophageal reflux disease)    History of colon polyps    History of DVT of lower extremity    POST LEFT TOTAL KNEE  1996   History of hiatal hernia    History of iron deficiency anemia 1996   iron infusion   History of migraine headaches    History of radiation therapy    left breast 03/27/2021-04/23/2021 Dr [name]   History of  rib fracture    Hyperlipidemia    Hypertension    IBS (irritable bowel syndrome)    Insomnia    Joint pain    MCI (mild cognitive impairment)    Melanoma ([facility]) 2019   left leg    Migraine headache    hx of migraines    OA (osteoarthritis)    RIGHT SHOULDER   Osteopenia    Pneumonia    remote history   PONV (postoperative nausea and vomiting)    Recovering alcoholic ([place])    SINCE [id number]   Right rotator cuff tear    SOB (shortness of breath) on exertion    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   Swallowing difficulty    Unspecified essential hypertension     Past Surgical History:  Procedure Laterality Date   BREAST BIOPSY Left 01/06/2021   pos   BREAST LUMPECTOMY WITH RADIOACTIVE SEED LOCALIZATION Left 02/27/2021   Procedure: LEFT BREAST LUMPECTOMY WITH RADIOACTIVE SEED LOCALIZATION;  Surgeon: [name], MD;  Location: [place];  Service: General;  Laterality: Left;   BUNIONECTOMY/  HAMMERTOE CORRECTION  RIGHT FOOT  2011   CATARACT EXTRACTION W/ INTRAOCULAR LENS  IMPLANT, BILATERAL     COLONOSCOPY     DILATION AND CURETTAGE OF UTERUS  1976   EYE SURGERY Bilateral 2016   cataract removal  KNEE ARTHROSCOPY W/ MENISCECTOMY Bilateral X2  LEFT /    X1  RIGHT   KNEE OPEN LATERAL RELEASE Bilateral    MOHS SURGERY Left 12/15/2017   Melanoma in situ - [facility]   ORIF ANKLE FRACTURE Right 08/04/2020   Procedure: OPEN REDUCTION INTERNAL FIXATION (ORIF) ANKLE FRACTURE;  Surgeon: Wylene Simmer, MD;  Location: [facility];  Service: Orthopedics;  Laterality: Right;  Mini C-arm, [employer] Small Frag   REPLACEMENT TOTAL KNEE Left 2006   SHOULDER ARTHROSCOPY WITH SUBACROMIAL DECOMPRESSION, ROTATOR CUFF REPAIR AND BICEP TENDON REPAIR Right 05/23/2013   Procedure: RIGHT SHOULDER ARTHROSCOPY EXAM UNDER ANESTHESIA  WITH SUBACROMIAL DECOMPRESSION,DISTAL CLAVICLE RESECTION, SADLABRAL DEBRIDEMENT CHONDROPLASTY, BICEP TENOTOMY ;  Surgeon: Sydnee Cabal, MD;   Location: [place];  Service: Orthopedics;  Laterality: Right;   TONSILLECTOMY AND ADENOIDECTOMY  AGE 29   TOTAL HIP ARTHROPLASTY Left 05/04/2017   Procedure: LEFT TOTAL HIP ARTHROPLASTY ANTERIOR APPROACH;  Surgeon: Paralee Cancel, MD;  Location: [facility];  Service: Orthopedics;  Laterality: Left;   TOTAL HIP ARTHROPLASTY Right 08/01/2019   Procedure: TOTAL HIP ARTHROPLASTY ANTERIOR APPROACH;  Surgeon: Paralee Cancel, MD;  Location: [facility];  Service: Orthopedics;  Laterality: Right;  25mns   TOTAL KNEE ARTHROPLASTY Bilateral LEFT  1996/   RIGHT 2004   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      UPPER GASTROINTESTINAL ENDOSCOPY     UPPER GI ENDOSCOPY     VAGINAL HYSTERECTOMY  1976    Social History   Socioeconomic History   Marital status: Widowed    Spouse name: Not on file   Number of children: 3   Years of education: Not on file   Highest education level: Master's degree (e.g., MA, MS, MEng, MEd, MSW, MBA)  Occupational History   Occupation: retired tTransport planner Tobacco Use   Smoking status: Former    Packs/day: 0.50    Years: 15.00    Total pack years: 7.50    Types: Cigarettes    Quit date: 05/15/1985    Years since quitting: 36.9   Smokeless tobacco: Never  Vaping Use   Vaping Use: Never used  Substance and Sexual Activity   Alcohol use: No    Comment: RECOVERING ALCOHOLIC--  QUIT [id number]  Drug use: No   Sexual activity: Not Currently    Birth control/protection: Post-menopausal  Other Topics Concern   Not on file  Social History Narrative   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    Widowed    Three kids    628grandchildren       Social Determinants of Health   Financial Resource Strain: Low Risk  (04/18/2021)   Overall Financial Resource Strain (CARDIA)    Difficulty of Paying Living Expenses: Not hard at all  Food Insecurity: No Food Insecurity (04/18/2021)   Hunger Vital Sign    Worried About Running Out of Food  in the Last Year: Never true    RCharentonin the Last Year: Never true  Transportation Needs: No Transportation Needs (04/18/2021)   PRAPARE - THydrologist(Medical): No    Lack of Transportation (Non-Medical): No  Physical Activity: Inactive (04/18/2021)   Exercise Vital Sign    Days of Exercise per Week: 0 days    Minutes of Exercise per Session: 0 min  Stress: No Stress Concern Present (04/18/2021)   [employer] -  Occupational Stress Questionnaire    Feeling of Stress : Not at all  Social Connections: Moderately Integrated (04/18/2021)   Social Connection and Isolation Panel [NHANES]    Frequency of Communication with Friends and Family: More than three times a week    Frequency of Social Gatherings with Friends and Family: More than three times a week    Attends Religious Services: More than 4 times per year    Active Member of [employer] or Organizations: Yes    Attends Archivist Meetings: More than 4 times per year    Marital Status: Widowed  Intimate Partner Violence: Not At Risk (04/18/2021)   Humiliation, Afraid, Rape, and Kick questionnaire    Fear of Current or Ex-Partner: No    Emotionally Abused: No    Physically Abused: No    Sexually Abused: No    Family History  Problem Relation Age of Onset   Cancer Mother    Heart disease Mother    Hypertension Mother    Melanoma Mother 44   Obesity Mother    Heart disease Father 33   Hypertension Father    Esophageal cancer Brother    Dementia Paternal Grandfather    Melanoma Niece 58   Colon cancer Neg Hx    Colon polyps Neg Hx    Stomach cancer Neg Hx    Rectal cancer Neg Hx     Current Outpatient Medications on File Prior to Visit  Medication Sig Dispense Refill   acetaminophen (TYLENOL) 325 MG tablet Take 650 mg by mouth every 6 (six) hours as needed for moderate pain.     Antiseborrheic Products, Misc. (PROMISEB) CREA Uses topical daily as needed on  her face     Ascorbic Acid (VITAMIN C) 1000 MG tablet Take 1,000 mg by mouth daily.     atorvastatin (LIPITOR) 40 MG tablet Take 1 tablet (40 mg total) by mouth daily. 90 tablet 3   betamethasone dipropionate 0.05 % lotion APPLY TOPICALLY TO AFFECTED AREA EVERY DAY 60 mL 1   ELIQUIS 5 MG TABS tablet TAKE 1 TABLET BY MOUTH TWICE A DAY 180 tablet 1   famotidine (PEPCID) 20 MG tablet Take 1 tablet (20 mg total) by mouth daily as needed for heartburn or indigestion. (Patient taking differently: Take 20 mg by mouth daily.) 90 tablet 3   FIBER PO Take 1 capsule by mouth daily. Pt takes in the evening.     HYDROcodone-acetaminophen (NORCO/VICODIN) 5-325 MG tablet 1 tablet as needed     hydrocortisone cream 1 % Apply 1 application topically daily as needed for itching.     L-Methylfolate-B12-B6-B2 (CEREFOLIN) 07-17-48-5 MG TABS Take 1 tablet by mouth daily. 90 tablet 1   losartan (COZAAR) 100 MG tablet TAKE 1 TABLET BY MOUTH EVERY DAY 90 tablet 2   metFORMIN (GLUCOPHAGE) 500 MG tablet Take 1 tablet (500 mg total) by mouth 2 (two) times daily with a meal. 60 tablet 0   metoprolol tartrate (LOPRESSOR) 25 MG tablet Take 1 tablet (25 mg total) by mouth 2 (two) times daily. 180 tablet 3   omeprazole (PRILOSEC) 40 MG capsule TAKE 1 CAPSULE (40 MG TOTAL) BY MOUTH DAILY. 90 capsule 2   rizatriptan (MAXALT) 10 MG tablet Take 1 tablet (10 mg total) by mouth as needed for migraine. May repeat in 2 hours if needed 10 tablet 2   sulfamethoxazole-trimethoprim (BACTRIM,SEPTRA) 400-80 MG tablet Take 1 tablet by mouth daily. For urethritis.     tamoxifen (NOLVADEX) 10 MG tablet  Take 1/2 tablet by mouth in the evening     traZODone (DESYREL) 50 MG tablet TAKE 1 TABLET BY MOUTH EVERYDAY AT BEDTIME 90 tablet 1   venlafaxine XR (EFFEXOR-XR) 37.5 MG 24 hr capsule Take 1 capsule (37.5 mg total) by mouth daily. 90 capsule 0   Vitamin D, Ergocalciferol, (DRISDOL) 1.25 MG (50000 UNIT) CAPS capsule Take 1 capsule (50,000 Units total)  by mouth every 7 (seven) days. 4 capsule 0   No current facility-administered medications on file prior to visit.    Allergies  Allergen Reactions   Nsaids Other (See Comments) and Anaphylaxis    SEVERE STOMACH CRAMPS, MOUTH SORES  **Able to tolerate Tylenol   Otezla [Apremilast] Anaphylaxis    Suicidal ideation   Penicillins Anaphylaxis and Other (See Comments)    Has patient had a PCN reaction causing immediate rash, facial/tongue/throat swelling, SOB or lightheadedness with hypotension:  Yes  Has patient had a PCN reaction causing severe rash involving mucus membranes or skin necrosis: Yes  Has patient had a PCN reaction that required hospitalization: No  Has patient had a PCN reaction occurring within the last 10 year No  If all of the above answers are "NO", then may proceed with Cephalosporin use.   Erythromycin Other (See Comments)    SEVERE STOMACH CRAMPS   Morphine And Related Nausea And Vomiting   Penicillamine Other (See Comments)   Remicade [Infliximab] Other (See Comments)    Shut down immune system-BP high   Risankizumab Other (See Comments)    URI, fever, cough, UTI   Tolmetin     SEVERE STOMACH CRAMPS   Nickel Rash    Including snaps on hospital gowns        Physical Exam There were no vitals filed for this visit. Estimated body mass index is 31.8 kg/m as calculated from the following:   Height as of 04/09/22: '5\' 6"'$  (1.676 m).   Weight as of 04/09/22: 197 lb (89.4 kg).  EKG (optional): deferred due to virtual visit  GENERAL: alert, oriented, no acute distress detected, full vision exam deferred due to pandemic and/or virtual encounter  PSYCH/NEURO: pleasant and cooperative, no obvious depression or anxiety, speech and thought processing grossly intact, Cognitive function grossly intact  Flowsheet Row Video Visit from 04/21/2022 in [place] [facility]  PHQ-9 Total Score 9           04/21/2022    3:23 PM 06/11/2021    8:54  AM 04/24/2021    9:15 AM 04/18/2021    3:34 PM 04/10/2020   10:27 AM  Depression screen PHQ 2/9  Decreased Interest 0 2 2 0 0  Down, Depressed, Hopeless '2 2 1 '$ 0 1  PHQ - 2 Score '2 4 3 '$ 0 1  Altered sleeping 0 0 2    Tired, decreased energy '2 3 2    '$ Change in appetite 0 2 3    Feeling bad or failure about yourself  '1 1 3    '$ Trouble concentrating 3 0 1    Moving slowly or fidgety/restless 1 1 0    Suicidal thoughts 0 0 0    PHQ-9 Score '9 11 14    '$ Difficult doing work/chores Somewhat difficult  Somewhat difficult         03/19/2021    1:11 PM 04/18/2021    3:38 PM 04/21/2021   10:00 AM 06/23/2021   11:15 AM 04/21/2022    3:23 PM  Fall Risk  Falls in the  past year?  0   1  Was there an injury with Fall?  0   0  Fall Risk Category Calculator  0   1  Fall Risk Category (Retired)  Low     (RETIRED) Patient Fall Risk Level High fall risk Low fall risk Low fall risk Low fall risk   Patient at Risk for Falls Due to  No Fall Risks        SUMMARY AND PLAN:  Encounter for Medicare annual wellness exam   Discussed applicable health maintenance/preventive health measures and advised and referred or ordered per patient preferences:  Health Maintenance  Topic Date Due   COVID-19 Vaccine (6 - 2023-24 season) 11/17/2022 (Originally 02/11/2022)   Medicare Annual Wellness ([place])  04/21/2023   COLONOSCOPY (Pts 45-12yr Insurance coverage will need to be confirmed)  11/18/2024   DTaP/Tdap/Td (3 - Td or Tdap) 08/01/2030   Pneumonia Vaccine 77 Years old  Completed   INFLUENZA VACCINE  Completed   DEXA SCAN  Completed   Hepatitis C Screening  Completed   Zoster Vaccines- Shingrix  Completed   HPV VACCINES  Aged Out     Education and counseling on the following was provided based on the above review of health and a plan/checklist for the patient, along with additional information discussed, was provided for the patient in the patient instructions :  -Provided counseling and plan for increased risk  of falling if applicable per above screening. Safe balance exercises provided.  -Advised and counseled on maintaining healthy weight and healthy lifestyle - including the importance of a healthy diet, regular physical activity, social connections and stress management. Congratulated on lifestyle changes she has made and progress.  -Advised and counseled on a whole foods based healthy diet and regular exercise: discussed a heart healthy whole foods based diet at length. A summary of a healthy diet was provided in the Patient Instructions. We had a lengthy discussion about healthy plant based options for protein options  - Recommended regular exercise, discussed exercise guidelines for adults and discussed options within the community.  -Advise yearly dental visits at minimum and regular eye exams   Follow up: see patient instructions     Patient Instructions  I really enjoyed getting to talk with you today! I am available on Tuesdays and Thursdays for virtual visits if you have any questions or concerns, or if I can be of any further assistance.   CHECKLIST FROM ANNUAL WELLNESS VISIT:  -Follow up (please call to schedule if not scheduled after visit):  -Inperson visit with your Primary Doctor office: I sent message to the office regarding your request fo your yearly appointment - please call in a few days to check if you are not contacted -yearly for annual wellness visit with primary care office  Here is a list of your preventive care/health maintenance measures and the plan for each if any are due:  Health Maintenance  Topic Date Due   COVID-19 Vaccine (6 - 2023-24 season) 11/17/2022 (Originally 02/11/2022)   Medicare Annual Wellness ([place]  04/21/2023   COLONOSCOPY (Pts 45-463yrInsurance coverage will need to be confirmed)  11/18/2024   DTaP/Tdap/Td (3 - Td or Tdap) 08/01/2030   Pneumonia Vaccine [age]Years old  Completed   INFLUENZA VACCINE  Completed   DEXA SCAN  Completed    Hepatitis C Screening  Completed   Zoster Vaccines- Shingrix  Completed   HPV VACCINES  Aged Out    -See a dentist at least yearly  -  Get your eyes checked and then per your eye specialist's recommendations  -Other issues addressed today:   -I have included below further information regarding a healthy whole foods based diet, physical activity guidelines for adults, stress management and opportunities for social connections. I hope you find this information useful.   -----------------------------------------------------------------------------------------------------------------------------------------------------------------------------------------------------------------------------------------------------------  NUTRITION: -eat real food: lots of colorful vegetables (half the plate) and fruits -5-7 servings of vegetables and fruits per day (fresh or steamed is best), exp. 2 servings of vegetables with lunch and dinner and 2 servings of fruit per day. Berries and greens such as kale and collards are great choices.  -consume on a regular basis: whole grains (make sure first ingredient on label contains the word "whole"), fresh fruits, fish, nuts, seeds, healthy oils (such as olive oil, avocado oil, grape seed oil) -may eat small amounts of dairy and lean meat on occasion, but avoid processed meats such as ham, bacon, lunch meat, etc. -drink water -try to avoid fast food and pre-packaged foods, processed meat -most experts advise limiting sodium to < '2300mg'$  per day, should limit further is any chronic conditions such as high blood pressure, heart disease, diabetes, etc. The American Heart Association advised that < '1500mg'$  is is ideal -try to avoid foods that contain any ingredients with names you do not recognize  -try to avoid sugar/sweets (except for the natural sugar that occurs in fresh fruit) -try to avoid sweet drinks -try to avoid white rice, white bread, pasta (unless whole grain),  white or yellow potatoes  EXERCISE GUIDELINES FOR ADULTS: -if you wish to increase your physical activity, do so gradually and with the approval of your doctor -STOP and seek medical care immediately if you have any chest pain, chest discomfort or trouble breathing when starting or increasing exercise  -move and stretch your body, legs, feet and arms when sitting for long periods -Physical activity guidelines for optimal health in adults: -least 150 minutes per week of aerobic exercise (can talk, but not sing) once approved by your doctor, 20-30 minutes of sustained activity or two 10 minute episodes of sustained activity every day.  -resistance training at least 2 days per week if approved by your doctor -balance exercises 3+ days per week:   Stand somewhere where you have something sturdy to hold onto if you lose balance.    1) lift up on toes, start with 5x per day and work up to 20x   2) stand and lift on leg straight out to the side so that foot is a few inches of the floor, start with 5x each side and work up to 20x each side   3) stand on one foot, start with 5 seconds each side and work up to 20 seconds on each side  If you need ideas or help with getting more active:  -Silver sneakers https://tools.silversneakers.com  -Walk with a Doc: http://stephens-thompson.biz/  -try to include resistance (weight lifting/strength building) and balance exercises twice per week: or the following link for ideas: ChessContest.fr  UpdateClothing.com.cy           Lucretia Kern, DO

## 2022-04-21 NOTE — Patient Instructions (Addendum)
I really enjoyed getting to talk with you today! I am available on Tuesdays and Thursdays for virtual visits if you have any questions or concerns, or if I can be of any further assistance.   CHECKLIST FROM ANNUAL WELLNESS VISIT:  -Follow up (please call to schedule if not scheduled after visit):  -Inperson visit with your Primary Doctor office: I sent message to the office regarding your request fo your yearly appointment - please call in a few days to check if you are not contacted -yearly for annual wellness visit with primary care office  Here is a list of your preventive care/health maintenance measures and the plan for each if any are due:  Health Maintenance  Topic Date Due   COVID-19 Vaccine (6 - 2023-24 season) 11/17/2022 (Originally 02/11/2022)   Medicare Annual Wellness ([facility])  04/21/2023   COLONOSCOPY (Pts 45-31yr Insurance coverage will need to be confirmed)  11/18/2024   DTaP/Tdap/Td (3 - Td or Tdap) 08/01/2030   Pneumonia Vaccine 78 Years old  Completed   INFLUENZA VACCINE  Completed   DEXA SCAN  Completed   Hepatitis C Screening  Completed   Zoster Vaccines- Shingrix  Completed   HPV VACCINES  Aged Out    -See a dentist at least yearly  -Get your eyes checked and then per your eye specialist's recommendations  -Other issues addressed today:   -I have included below further information regarding a healthy whole foods based diet, physical activity guidelines for adults, stress management and opportunities for social connections. I hope you find this information useful.   -----------------------------------------------------------------------------------------------------------------------------------------------------------------------------------------------------------------------------------------------------------  NUTRITION: -eat real food: lots of colorful vegetables (half the plate) and fruits -5-7 servings of vegetables and fruits per day (fresh or steamed  is best), exp. 2 servings of vegetables with lunch and dinner and 2 servings of fruit per day. Berries and greens such as kale and collards are great choices.  -consume on a regular basis: whole grains (make sure first ingredient on label contains the word "whole"), fresh fruits, fish, nuts, seeds, healthy oils (such as olive oil, avocado oil, grape seed oil) -may eat small amounts of dairy and lean meat on occasion, but avoid processed meats such as ham, bacon, lunch meat, etc. -drink water -try to avoid fast food and pre-packaged foods, processed meat -most experts advise limiting sodium to < '2300mg'$  per day, should limit further is any chronic conditions such as high blood pressure, heart disease, diabetes, etc. The American Heart Association advised that < '1500mg'$  is is ideal -try to avoid foods that contain any ingredients with names you do not recognize  -try to avoid sugar/sweets (except for the natural sugar that occurs in fresh fruit) -try to avoid sweet drinks -try to avoid white rice, white bread, pasta (unless whole grain), white or yellow potatoes  EXERCISE GUIDELINES FOR ADULTS: -if you wish to increase your physical activity, do so gradually and with the approval of your doctor -STOP and seek medical care immediately if you have any chest pain, chest discomfort or trouble breathing when starting or increasing exercise  -move and stretch your body, legs, feet and arms when sitting for long periods -Physical activity guidelines for optimal health in adults: -least 150 minutes per week of aerobic exercise (can talk, but not sing) once approved by your doctor, 20-30 minutes of sustained activity or two 10 minute episodes of sustained activity every day.  -resistance training at least 2 days per week if approved by your doctor -balance exercises 3+ days  per week:   Stand somewhere where you have something sturdy to hold onto if you lose balance.    1) lift up on toes, start with 5x per  day and work up to 20x   2) stand and lift on leg straight out to the side so that foot is a few inches of the floor, start with 5x each side and work up to 20x each side   3) stand on one foot, start with 5 seconds each side and work up to 20 seconds on each side  If you need ideas or help with getting more active:  -Silver sneakers https://tools.silversneakers.com  -Walk with a Doc: http://stephens-thompson.biz/  -try to include resistance (weight lifting/strength building) and balance exercises twice per week: or the following link for ideas: ChessContest.fr  UpdateClothing.com.cy

## 2022-04-21 NOTE — Progress Notes (Unsigned)
Chief Complaint:   OBESITY Tonya Bartlett is here to discuss her progress with her obesity treatment plan along with follow-up of her obesity related diagnoses. Tonya Bartlett is on the Category 2 Plan and states she is following her eating plan approximately 70% of the time. Tonya Bartlett states she is walking for 20 minutes 3 times per week.  Today's visit was #: 13 Starting weight: 222 lbs Starting date: 06/11/2021 Today's weight: 197 lbs Today's date: 04/09/2022 Total lbs lost to date: 25 Total lbs lost since last in-office visit: 2  Interim History: Tonya Bartlett continues to do well with weight loss.  She notes her hunger is better controlled overall, but it is still a problem in the evenings.  Subjective:   1. Pre-diabetes Tonya Bartlett notes her polyphagia is decreased, especially in the PM when she remembers to take her second dose. She is doing well with her diet.   2. Vitamin D deficiency Tonya Bartlett is on Vitamin D prescription, and her last level was below goal.   Assessment/Plan:   1. Pre-diabetes Tonya Bartlett will continue metformin, and we will refill for 1 month. She will continue her diet, and will work on strategies to remember both doses.   - metFORMIN (GLUCOPHAGE) 500 MG tablet; Take 1 tablet (500 mg total) by mouth 2 (two) times daily with a meal.  Dispense: 60 tablet; Refill: 0  2. Vitamin D deficiency Tonya Bartlett will continue prescription Vitamin D, and we will refill for 1 month. We will continue to monitor her progress.   - Vitamin D, Ergocalciferol, (DRISDOL) 1.25 MG (50000 UNIT) CAPS capsule; Take 1 capsule (50,000 Units total) by mouth every 7 (seven) days.  Dispense: 4 capsule; Refill: 0  3. BMI 31.0-31.9,adult  4. Obesity, Beginning BMI 36.94 Tonya Bartlett is currently in the action stage of change. As such, her goal is to continue with weight loss efforts. She has agreed to the Category 2 Plan.   Exercise goals: Increase walking in good weather.   Behavioral modification strategies: increasing  lean protein intake and decreasing simple carbohydrates.  Tonya Bartlett has agreed to follow-up with our clinic in 4 weeks. She was informed of the importance of frequent follow-up visits to maximize her success with intensive lifestyle modifications for her multiple health conditions.   Objective:   Blood pressure 116/77, pulse 99, temperature 97.7 F (36.5 C), height '5\' 6"'$  (1.676 m), weight 197 lb (89.4 kg), SpO2 96 %. Body mass index is 31.8 kg/m.  Lab Results  Component Value Date   CREATININE 0.67 03/03/2022   BUN 8 03/03/2022   NA 141 03/03/2022   K 4.2 03/03/2022   CL 105 03/03/2022   CO2 23 03/03/2022   Lab Results  Component Value Date   ALT 18 01/29/2022   AST 21 01/29/2022   ALKPHOS 46 01/29/2022   BILITOT 0.5 01/29/2022   Lab Results  Component Value Date   HGBA1C 5.3 01/29/2022   HGBA1C 5.3 06/11/2021   HGBA1C 5.4 04/24/2021   HGBA1C 5.3 11/05/2016   Lab Results  Component Value Date   INSULIN 5.9 01/29/2022   INSULIN 9.6 06/11/2021   Lab Results  Component Value Date   TSH 1.930 03/03/2022   Lab Results  Component Value Date   CHOL 147 01/29/2022   HDL 61 01/29/2022   LDLCALC 70 01/29/2022   LDLDIRECT 187.9 10/08/2009   TRIG 84 01/29/2022   CHOLHDL 2.7 06/11/2021   Lab Results  Component Value Date   VD25OH 34.3 06/11/2021   Lab Results  Component Value Date   WBC 5.6 03/03/2022   HGB 11.6 03/03/2022   HCT 34.2 03/03/2022   MCV 97 03/03/2022   PLT 267 03/03/2022   No results found for: "IRON", "TIBC", "FERRITIN"  Attestation Statements:   Reviewed by clinician on day of visit: allergies, medications, problem list, medical history, surgical history, family history, social history, and previous encounter notes.   I, Dashay Marchuk, am acting as transcriptionist for [name], MD.  I have reviewed the above documentation for accuracy and completeness, and I agree with the above. -  [name], MD

## 2022-05-04 ENCOUNTER — Other Ambulatory Visit: Payer: Self-pay | Admitting: Adult Health

## 2022-05-04 DIAGNOSIS — M150 Primary generalized (osteo)arthritis: Secondary | ICD-10-CM | POA: Diagnosis not present

## 2022-05-04 DIAGNOSIS — Z760 Encounter for issue of repeat prescription: Secondary | ICD-10-CM

## 2022-05-04 DIAGNOSIS — L4052 Psoriatic arthritis mutilans: Secondary | ICD-10-CM | POA: Diagnosis not present

## 2022-05-04 DIAGNOSIS — Z79891 Long term (current) use of opiate analgesic: Secondary | ICD-10-CM | POA: Diagnosis not present

## 2022-05-04 DIAGNOSIS — G894 Chronic pain syndrome: Secondary | ICD-10-CM | POA: Diagnosis not present

## 2022-05-04 DIAGNOSIS — M47812 Spondylosis without myelopathy or radiculopathy, cervical region: Secondary | ICD-10-CM | POA: Diagnosis not present

## 2022-05-05 ENCOUNTER — Other Ambulatory Visit (INDEPENDENT_AMBULATORY_CARE_PROVIDER_SITE_OTHER): Payer: Self-pay | Admitting: Family Medicine

## 2022-05-05 DIAGNOSIS — E559 Vitamin D deficiency, unspecified: Secondary | ICD-10-CM

## 2022-05-05 NOTE — Telephone Encounter (Signed)
Okay for refill?  

## 2022-05-06 ENCOUNTER — Other Ambulatory Visit: Payer: Self-pay | Admitting: Adult Health

## 2022-05-06 DIAGNOSIS — Z760 Encounter for issue of repeat prescription: Secondary | ICD-10-CM

## 2022-05-06 MED ORDER — BETAMETHASONE DIPROPIONATE AUG 0.05 % EX LOTN: TOPICAL_LOTION | Freq: Two times a day (BID) | CUTANEOUS | 0 refills | Status: DC

## 2022-05-06 NOTE — Telephone Encounter (Signed)
Please advise. Pt wanting alternative in the notes below.

## 2022-05-07 ENCOUNTER — Ambulatory Visit (INDEPENDENT_AMBULATORY_CARE_PROVIDER_SITE_OTHER): Payer: Medicare Other | Admitting: Family Medicine

## 2022-05-07 ENCOUNTER — Encounter (INDEPENDENT_AMBULATORY_CARE_PROVIDER_SITE_OTHER): Payer: Self-pay | Admitting: Family Medicine

## 2022-05-07 VITALS — BP 130/62 | HR 57 | Temp 98.2°F | Ht 66.0 in | Wt 199.0 lb

## 2022-05-07 DIAGNOSIS — Z6832 Body mass index (BMI) 32.0-32.9, adult: Secondary | ICD-10-CM

## 2022-05-07 DIAGNOSIS — E559 Vitamin D deficiency, unspecified: Secondary | ICD-10-CM | POA: Diagnosis not present

## 2022-05-07 DIAGNOSIS — R7303 Prediabetes: Secondary | ICD-10-CM

## 2022-05-07 DIAGNOSIS — E669 Obesity, unspecified: Secondary | ICD-10-CM

## 2022-05-07 MED ORDER — VITAMIN D (ERGOCALCIFEROL) 1.25 MG (50000 UNIT) PO CAPS: 50000.0000 [IU] | ORAL_CAPSULE | ORAL | 0 refills | Status: DC

## 2022-05-07 MED ORDER — METFORMIN HCL 500 MG PO TABS: 500.0000 mg | ORAL_TABLET | Freq: Two times a day (BID) | ORAL | 0 refills | Status: DC

## 2022-05-12 NOTE — Progress Notes (Signed)
Chief Complaint:   OBESITY Tonya Bartlett is here to discuss her progress with her obesity treatment plan along with follow-up of her obesity related diagnoses. Tonya Bartlett is on the Category 2 Plan and states she is following her eating plan approximately 50% of the time. Tonya Bartlett states she is walking the dog, and doing chair exercise for 20 minutes 3 times per week.  Today's visit was #: 14 Starting weight: 222 lbs Starting date: 06/11/2021 Today's weight: 199 lbs Today's date: 05/07/2022 Total lbs lost to date: 23 Total lbs lost since last in-office visit: 0  Interim History: Tonya Bartlett has struggled with increased stress and comfort eating.  She notes it is worse when she forgets her Effexor for 5 days.  She is doing better now, but she is traveling to [place] soon to visit family.  Subjective:   1. Pre-diabetes Tonya Bartlett has struggled with increased carbohydrates and she is working on getting back on track with her eating plan.  No side effects were noted with metformin.  Her last A1c was well-controlled.  2. Vitamin D deficiency Tonya Bartlett is on vitamin D, and she denies nausea, vomiting, or muscle weakness.  Her last vitamin D level was below goal.  Assessment/Plan:   1. Pre-diabetes Tonya Bartlett will continue metformin, and we will refill for 1 month.  We will recheck labs next month.  - metFORMIN (GLUCOPHAGE) 500 MG tablet; Take 1 tablet (500 mg total) by mouth 2 (two) times daily with a meal.  Dispense: 60 tablet; Refill: 0  2. Vitamin D deficiency Tonya Bartlett will continue prescription vitamin D, and we will refill for 1 month.  We will recheck labs next month.  - Vitamin D, Ergocalciferol, (DRISDOL) 1.25 MG (50000 UNIT) CAPS capsule; Take 1 capsule (50,000 Units total) by mouth every 7 (seven) days.  Dispense: 4 capsule; Refill: 0  3. BMI 32.0-32.9,adult  4. Obesity, Beginning BMI 36.94 Tonya Bartlett is currently in the action stage of change. As such, her goal is to continue with weight loss efforts.  She has agreed to the Category 2 Plan.   Exercise goals: As is.   Behavioral modification strategies: increasing lean protein intake, better snacking choices, emotional eating strategies, and travel eating strategies.  Tonya Bartlett has agreed to follow-up with our clinic in 4 weeks. She was informed of the importance of frequent follow-up visits to maximize her success with intensive lifestyle modifications for her multiple health conditions.   Objective:   Blood pressure 130/62, pulse (!) 57, temperature 98.2 F (36.8 C), height 5\' 6"  (1.676 m), weight 199 lb (90.3 kg), SpO2 94 %. Body mass index is 32.12 kg/m.  Lab Results  Component Value Date   CREATININE 0.67 03/03/2022   BUN 8 03/03/2022   NA 141 03/03/2022   K 4.2 03/03/2022   CL 105 03/03/2022   CO2 23 03/03/2022   Lab Results  Component Value Date   ALT 18 01/29/2022   AST 21 01/29/2022   ALKPHOS 46 01/29/2022   BILITOT 0.5 01/29/2022   Lab Results  Component Value Date   HGBA1C 5.3 01/29/2022   HGBA1C 5.3 06/11/2021   HGBA1C 5.4 04/24/2021   HGBA1C 5.3 11/05/2016   Lab Results  Component Value Date   INSULIN 5.9 01/29/2022   INSULIN 9.6 06/11/2021   Lab Results  Component Value Date   TSH 1.930 03/03/2022   Lab Results  Component Value Date   CHOL 147 01/29/2022   HDL 61 01/29/2022   LDLCALC 70 01/29/2022   LDLDIRECT 187.9  10/08/2009   TRIG 84 01/29/2022   CHOLHDL 2.7 06/11/2021   Lab Results  Component Value Date   VD25OH 34.3 06/11/2021   Lab Results  Component Value Date   WBC 5.6 03/03/2022   HGB 11.6 03/03/2022   HCT 34.2 03/03/2022   MCV 97 03/03/2022   PLT 267 03/03/2022   No results found for: "IRON", "TIBC", "FERRITIN"  Attestation Statements:   Reviewed by clinician on day of visit: allergies, medications, problem list, medical history, surgical history, family history, social history, and previous encounter notes.   I, Sheridan Chukwu, am acting as transcriptionist for [name], MD.  I have reviewed the above documentation for accuracy and completeness, and I agree with the above. -  [name], MD

## 2022-05-14 ENCOUNTER — Other Ambulatory Visit (HOSPITAL_COMMUNITY): Payer: Self-pay | Admitting: Cardiovascular Disease

## 2022-05-15 ENCOUNTER — Other Ambulatory Visit: Payer: Self-pay | Admitting: Adult Health

## 2022-05-19 MED ORDER — BETAMETHASONE DIPROPIONATE 0.05 % EX LOTN: TOPICAL_LOTION | Freq: Two times a day (BID) | CUTANEOUS | 0 refills | Status: DC

## 2022-05-25 ENCOUNTER — Ambulatory Visit: Payer: Medicare Other | Admitting: Family Medicine

## 2022-05-25 ENCOUNTER — Encounter: Payer: Self-pay | Admitting: Family Medicine

## 2022-05-25 ENCOUNTER — Ambulatory Visit (INDEPENDENT_AMBULATORY_CARE_PROVIDER_SITE_OTHER): Payer: Medicare Other | Admitting: Family Medicine

## 2022-05-25 VITALS — BP 110/74 | HR 64 | Temp 98.5°F | Wt 206.4 lb

## 2022-05-25 DIAGNOSIS — M6740 Ganglion, unspecified site: Secondary | ICD-10-CM | POA: Diagnosis not present

## 2022-05-25 DIAGNOSIS — R1310 Dysphagia, unspecified: Secondary | ICD-10-CM | POA: Diagnosis not present

## 2022-05-25 DIAGNOSIS — M25531 Pain in right wrist: Secondary | ICD-10-CM

## 2022-05-25 DIAGNOSIS — K449 Diaphragmatic hernia without obstruction or gangrene: Secondary | ICD-10-CM

## 2022-05-25 DIAGNOSIS — F3341 Major depressive disorder, recurrent, in partial remission: Secondary | ICD-10-CM

## 2022-05-25 DIAGNOSIS — R5383 Other fatigue: Secondary | ICD-10-CM

## 2022-05-25 NOTE — Progress Notes (Signed)
Established Patient Office Visit   Subjective  Patient ID: Tonya Bartlett, female    DOB: Jun 22, 1945  Age: 77 y.o. MRN: [id number]  Chief Complaint  Patient presents with   Fatigue   Dysphagia    Pt is a 77 yo female followed by Shirline Frees, NP and seen for acute concern.  Pt with h/o dysphagia for solids and liquids, worse in the last 2 wks.  Had esophageal dilation in October but does not feel like it helped symptoms.  States had a swallow study but only did liquids.  Contacted GI but unable to get an appointment until June 21.  Also with history of hiatal hernia.   Patient also notes increased fatigue in the last few months.  Has decreased interest and change in energy.  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  Has no desire to drink but not enjoying AA meetings like she used to.  Notes being more irritable.  Effexor dose increased 3 weeks ago by Dr. Mervyn Skeeters, Psychiatry.   Has 2 bumps on R wrist.  Painful at times.      ROS Negative unless stated above    Objective:     BP 110/74 (BP Location: Left Arm, Patient Position: Sitting, Cuff Size: Large)   Pulse 64   Temp 98.5 F (36.9 C) (Oral)   Wt 206 lb 6.4 oz (93.6 kg)   SpO2 98%   BMI 33.31 kg/m    Physical Exam Constitutional:      General: She is not in acute distress.    Appearance: Normal appearance.  HENT:     Head: Normocephalic and atraumatic.     Nose: Nose normal.     Mouth/Throat:     Mouth: Mucous membranes are moist.  Cardiovascular:     Rate and Rhythm: Normal rate and regular rhythm.     Heart sounds: Normal heart sounds. No murmur heard.    No gallop.  Pulmonary:     Effort: Pulmonary effort is normal. No respiratory distress.     Breath sounds: Normal breath sounds. No wheezing, rhonchi or rales.  Skin:    General: Skin is warm and dry.          Comments: Anterior right wrist with 2 firm, mobile, cystic-like structures  Neurological:     Mental Status: She is alert and oriented to person,  place, and time.       05/25/2022    4:40 PM 04/21/2022    3:23 PM 06/11/2021    8:54 AM  Depression screen PHQ 2/9  Decreased Interest 2 0 2  Down, Depressed, Hopeless 2 2 2   PHQ - 2 Score 4 2 4   Altered sleeping 2 0 0  Tired, decreased energy 2 2 3   Change in appetite 2 0 2  Feeling bad or failure about yourself  0 1 1  Trouble concentrating 0 3 0  Moving slowly or fidgety/restless 0 1 1  Suicidal thoughts 0 0 0  PHQ-9 Score 10 9 11   Difficult doing work/chores Somewhat difficult Somewhat difficult       05/25/2022    4:41 PM 12/28/2017    2:08 PM  GAD 7 : Generalized Anxiety Score  Nervous, Anxious, on Edge 1   Control/stop worrying 0   Worry too much - different things 0   Trouble relaxing 0   Restless 0   Easily annoyed or irritable 2   Afraid - awful might happen 2   Total GAD 7  Score 5      Information is confidential and restricted. Go to Review Flowsheets to unlock data.      No results found for any visits on 05/25/22.    Assessment & Plan:  Dysphagia, unspecified type  Hiatal hernia  Recurrent major depressive disorder, in partial remission  Ganglion cyst  Right wrist pain  Other fatigue  Discussed limited in treatment options here in clinic.  Advised on the importance of contacting GI for follow-up.  May benefit from repeated swallow study with various consistencies of food.  Advised increased fatigue and decreased motivation likely 2/2 worsening depression symptoms.  As dose of Effexor increased recently advised to wait the full 4-6 weeks for full effect.  Encouraged to schedule follow-up appointment with psychiatry as medication may need to be augmented.  Given information about ganglion cyst and wrist pain.  Consider referral to hand or Ortho if removal desired.  Return if symptoms worsen or fail to improve.    On day of service, 41 minutes spent caring for this patient face-to-face, reviewing the chart, counseling and/or coordinating care for  plan and treatment of diagnosis below.     Deeann Saint, MD

## 2022-06-01 ENCOUNTER — Telehealth (HOSPITAL_COMMUNITY): Payer: Medicare Other | Admitting: Psychiatry

## 2022-06-01 ENCOUNTER — Encounter (HOSPITAL_COMMUNITY): Payer: Self-pay

## 2022-06-01 ENCOUNTER — Other Ambulatory Visit (INDEPENDENT_AMBULATORY_CARE_PROVIDER_SITE_OTHER): Payer: Self-pay | Admitting: Family Medicine

## 2022-06-01 DIAGNOSIS — R7303 Prediabetes: Secondary | ICD-10-CM

## 2022-06-04 ENCOUNTER — Encounter (INDEPENDENT_AMBULATORY_CARE_PROVIDER_SITE_OTHER): Payer: Self-pay | Admitting: Family Medicine

## 2022-06-04 ENCOUNTER — Telehealth (HOSPITAL_COMMUNITY): Payer: Self-pay | Admitting: Psychiatry

## 2022-06-04 ENCOUNTER — Ambulatory Visit (INDEPENDENT_AMBULATORY_CARE_PROVIDER_SITE_OTHER): Payer: Medicare Other | Admitting: Family Medicine

## 2022-06-04 VITALS — BP 133/80 | HR 49 | Temp 97.4°F | Ht 66.0 in | Wt 203.0 lb

## 2022-06-04 DIAGNOSIS — F3289 Other specified depressive episodes: Secondary | ICD-10-CM | POA: Diagnosis not present

## 2022-06-04 DIAGNOSIS — E669 Obesity, unspecified: Secondary | ICD-10-CM | POA: Diagnosis not present

## 2022-06-04 DIAGNOSIS — R7303 Prediabetes: Secondary | ICD-10-CM

## 2022-06-04 DIAGNOSIS — Z6832 Body mass index (BMI) 32.0-32.9, adult: Secondary | ICD-10-CM | POA: Diagnosis not present

## 2022-06-04 DIAGNOSIS — E559 Vitamin D deficiency, unspecified: Secondary | ICD-10-CM | POA: Diagnosis not present

## 2022-06-04 MED ORDER — METFORMIN HCL 500 MG PO TABS: 500.0000 mg | ORAL_TABLET | Freq: Two times a day (BID) | ORAL | 0 refills | Status: DC

## 2022-06-04 MED ORDER — VITAMIN D (ERGOCALCIFEROL) 1.25 MG (50000 UNIT) PO CAPS: 50000.0000 [IU] | ORAL_CAPSULE | ORAL | 0 refills | Status: DC

## 2022-06-04 NOTE — Telephone Encounter (Signed)
Patient called requesting refill of  venlafaxine XR (EFFEXOR-XR) 37.5 MG 24 hr capsule stating dose had been doubled after last visit so she is out and "the pharmacy won't refill because it is too early."   [id number] [place], [place] - [address] Phone: [phone]  Fax: [phone]     Last ordered: 03/27/2022 - 90 capsules  Last visit: 03/27/2022  Next visit: 06/15/2022

## 2022-06-05 MED ORDER — VENLAFAXINE HCL ER 37.5 MG PO CP24: 37.5000 mg | ORAL_CAPSULE | Freq: Every day | ORAL | 0 refills | Status: DC

## 2022-06-08 ENCOUNTER — Telehealth (HOSPITAL_COMMUNITY): Payer: Self-pay | Admitting: Psychiatry

## 2022-06-08 MED ORDER — VENLAFAXINE HCL ER 37.5 MG PO CP24: 37.5000 mg | ORAL_CAPSULE | Freq: Two times a day (BID) | ORAL | 0 refills | Status: DC

## 2022-06-08 NOTE — Telephone Encounter (Signed)
Pt calling  Needs refill on Effexor 2 pills a day now (37.5mg ).  She states she spoke with you about this earlier.  [facility] [address]   Next Visit -4/29 Last Visit 2/9

## 2022-06-08 NOTE — Telephone Encounter (Signed)
Informed pt.  She shows understanding, nothing further needed at this time.

## 2022-06-08 NOTE — Telephone Encounter (Signed)
Medication management - Message left for patient to please all our office back to verify current dosage she is taking of Venlafaxine XR daily.

## 2022-06-09 ENCOUNTER — Telehealth (HOSPITAL_COMMUNITY): Payer: Self-pay

## 2022-06-09 NOTE — Progress Notes (Signed)
Chief Complaint:   OBESITY Tonya Bartlett is here to discuss her progress with her obesity treatment plan along with follow-up of her obesity related diagnoses. Tonya Bartlett is on the Category 2 Plan and states she is following her eating plan approximately 20% of the time. Tonya Bartlett states she is doing 0 minutes 0 times per week.  Today's visit was #: 15 Starting weight: 222 lbs Starting date: 06/11/2021 Today's weight: 203 lbs Today's date: 06/04/2022 Total lbs lost to date: 19 Total lbs lost since last in-office visit: 0  Interim History: Tonya Bartlett is struggling to stay on track.  She has some esophageal construction and she has to wait another 2 months to see GI.  She has not been eating much protein since it is difficult to swallow it.  Her RMR has likely decreased.  Subjective:   1. Pre-diabetes Tonya Bartlett is on metformin, and she is working on her diet but she is struggling.  2. Vitamin D deficiency Tonya Bartlett is on vitamin D, and she denies nausea, vomiting, or muscle weakness.  3. Emotional Eating Behavior Tonya Bartlett is increasing her emotional eating behavior, worse with decreased protein intake.  Assessment/Plan:   1. Pre-diabetes Tonya Bartlett will continue metformin, and we will refill for 1 month.  - metFORMIN (GLUCOPHAGE) 500 MG tablet; Take 1 tablet (500 mg total) by mouth 2 (two) times daily with a meal.  Dispense: 60 tablet; Refill: 0  2. Vitamin D deficiency Tonya Bartlett will continue prescription vitamin D, and we will refill for 1 month.  - Vitamin D, Ergocalciferol, (DRISDOL) 1.25 MG (50000 UNIT) CAPS capsule; Take 1 capsule (50,000 Units total) by mouth every 7 (seven) days.  Dispense: 4 capsule; Refill: 0  3. Emotional Eating Behavior Tonya Bartlett will work on increasing her protein intake and be mindful of decreasing emotional eating behavior.  4. BMI 32.0-32.9,adult  5. Obesity, Beginning BMI 36.94 Tonya Bartlett is currently in the action stage of change. As such, her goal is to continue with  weight loss efforts. She has agreed to keeping a food journal and adhering to recommended goals of 1200 calories and 75+ grams of protein daily.   Behavioral modification strategies: increasing lean protein intake.  Tonya Bartlett has agreed to follow-up with our clinic in 4 weeks. She was informed of the importance of frequent follow-up visits to maximize her success with intensive lifestyle modifications for her multiple health conditions.   Objective:   Blood pressure 133/80, pulse (!) 49, temperature (!) 97.4 F (36.3 C), height  (1.676 m), weight 203 lb (92.1 kg), SpO2 98 %. Body mass index is 32.77 kg/m.  Lab Results  Component Value Date   CREATININE 0.67 03/03/2022   BUN 8 03/03/2022   NA 141 03/03/2022   K 4.2 03/03/2022   CL 105 03/03/2022   CO2 23 03/03/2022   Lab Results  Component Value Date   ALT 18 01/29/2022   AST 21 01/29/2022   ALKPHOS 46 01/29/2022   BILITOT 0.5 01/29/2022   Lab Results  Component Value Date   HGBA1C 5.3 01/29/2022   HGBA1C 5.3 06/11/2021   HGBA1C 5.4 04/24/2021   HGBA1C 5.3 11/05/2016   Lab Results  Component Value Date   INSULIN 5.9 01/29/2022   INSULIN 9.6 06/11/2021   Lab Results  Component Value Date   TSH 1.930 03/03/2022   Lab Results  Component Value Date   CHOL 147 01/29/2022   HDL 61 01/29/2022   LDLCALC 70 01/29/2022   LDLDIRECT 187.9 10/08/2009   TRIG 84  01/29/2022   CHOLHDL 2.7 06/11/2021   Lab Results  Component Value Date   VD25OH 34.3 06/11/2021   Lab Results  Component Value Date   WBC 5.6 03/03/2022   HGB 11.6 03/03/2022   HCT 34.2 03/03/2022   MCV 97 03/03/2022   PLT 267 03/03/2022   No results found for: "IRON", "TIBC", "FERRITIN"  Attestation Statements:   Reviewed by clinician on day of visit: allergies, medications, problem list, medical history, surgical history, family history, social history, and previous encounter notes.   I, Jhoana Upham, am acting as transcriptionist for Quillian Quince, MD.  I have reviewed the above documentation for accuracy and completeness, and I agree with the above. -  Quillian Quince, MD

## 2022-06-09 NOTE — Telephone Encounter (Signed)
Medication problem - Telephone call with patient to follow up on message she left that she still has not been able to fill her Venlafaxine 2 a day. Informed her pharmacy just faxed over a prior authorization request for it exceeding the 1 allowed per day and would complete and request approval.  Will contact patient back once decision received.

## 2022-06-10 ENCOUNTER — Telehealth (HOSPITAL_COMMUNITY): Payer: Self-pay

## 2022-06-10 DIAGNOSIS — F331 Major depressive disorder, recurrent, moderate: Secondary | ICD-10-CM

## 2022-06-10 MED ORDER — VENLAFAXINE HCL ER 75 MG PO CP24: 75.0000 mg | ORAL_CAPSULE | Freq: Every day | ORAL | 0 refills | Status: DC

## 2022-06-10 NOTE — Telephone Encounter (Signed)
Medicaiton management - Message left for patient that a new order for Venlafaxine XR 75 mg, one a day had been sent into [facility] [address] as her insurance would not approve the 37.5 mg dosage twice a day. Informed that a message had been left with the [facility] to call our office back if any further issues filling medication since changed to approved one a day, 75 mg dosage and requested patient do the same if any issues filling and taking once a day.

## 2022-06-15 ENCOUNTER — Encounter (HOSPITAL_COMMUNITY): Payer: Self-pay | Admitting: Psychiatry

## 2022-06-15 ENCOUNTER — Telehealth (INDEPENDENT_AMBULATORY_CARE_PROVIDER_SITE_OTHER): Payer: Medicare Other | Admitting: Psychiatry

## 2022-06-15 DIAGNOSIS — F5102 Adjustment insomnia: Secondary | ICD-10-CM

## 2022-06-15 DIAGNOSIS — F331 Major depressive disorder, recurrent, moderate: Secondary | ICD-10-CM | POA: Diagnosis not present

## 2022-06-15 DIAGNOSIS — F1021 Alcohol dependence, in remission: Secondary | ICD-10-CM

## 2022-06-15 NOTE — Progress Notes (Signed)
[facility] Outpatient visit Tele psych  Patient Identification: MILANIE ROSENFIELD MRN:  [id number] Date of Evaluation:  06/15/2022 Referral Source: NP Chief Complaint:   Depression follow up  Visit Diagnosis:    ICD-10-CM   1. Adjustment insomnia  F51.02     2. Major depressive disorder, recurrent episode, moderate (HCC)  F33.1     3. Alcohol dependence in remission [facility])  F10.21      Virtual Visit via Video Note  I connected with Mistie Adney Vespa on 06/15/22 at  2:00 PM EDT by a video enabled telemedicine application and verified that I am speaking with the correct person using two identifiers.  Location: Patient: home Provider: home office   I discussed the limitations of evaluation and management by telemedicine and the availability of in person appointments. The patient expressed understanding and agreed to proceed.      I discussed the assessment and treatment plan with the patient. The patient was provided an opportunity to ask questions and all were answered. The patient agreed with the plan and demonstrated an understanding of the instructions.   The patient was advised to call back or seek an in-person evaluation if the symptoms worsen or if the condition fails to improve as anticipated.  I provided 15 - 20 minutes of non-face-to-face time during this encounter.    History of Present Illness: 77 years old currently widowed white female lives by herself has 3 grown kids and 6 grandkids referred initially by family medicine for management of depression   Remains sober 40 years plus and active in AA Has had breast surgery daignosed with cancer, recovering, on radiation.   Last visit was amotviated and later considered to increase effexor now on 75mg , doing better, less anxious still has medical co morbidity including GI symptoms effect her Daughter is now communicating    Modifying factors :family,  daughter is now communicating aggravating factors. Hip surgery, breast  surgery, medical conditions Severity better  Past Psychiatric History: depression  Previous Psychotropic Medications: Yes   Substance Abuse History in the last 12 months:  No.  Consequences of Substance Abuse: NA  Past Medical History:  Past Medical History:  Diagnosis Date   Alcohol abuse    Sober 41 years.   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  1996   History of hiatal hernia    History of iron deficiency anemia 1996   iron infusion   History of migraine headaches    History of radiation therapy    left breast 03/27/2021-04/23/2021 Dr Antony Blackbird   History of rib fracture    Hyperlipidemia    Hypertension    IBS (irritable bowel syndrome)    Insomnia    Joint pain    MCI (mild cognitive impairment)  Melanoma (HCC) 2019   left leg    Migraine headache    hx of migraines    OA (osteoarthritis)    RIGHT SHOULDER   Osteopenia    Pneumonia    remote history   PONV (postoperative nausea and vomiting)    Recovering alcoholic (HCC)    SINCE 01-21-1980   Right rotator cuff tear    SOB (shortness of breath) on exertion    Substance abuse (HCC)    recovering alcoholic   Swallowing difficulty    Unspecified essential hypertension     Past Surgical History:  Procedure Laterality Date   BREAST BIOPSY Left 01/06/2021   pos   BREAST LUMPECTOMY WITH RADIOACTIVE SEED LOCALIZATION Left 02/27/2021   Procedure: LEFT BREAST LUMPECTOMY WITH  RADIOACTIVE SEED LOCALIZATION;  Surgeon: Almond Lint, MD;  Location: [facility] OR;  Service: General;  Laterality: Left;   BUNIONECTOMY/  HAMMERTOE CORRECTION  RIGHT FOOT  2011   CATARACT EXTRACTION W/ INTRAOCULAR LENS  IMPLANT, BILATERAL     COLONOSCOPY     DILATION AND CURETTAGE OF UTERUS  1976   EYE SURGERY Bilateral 2016   cataract removal   KNEE ARTHROSCOPY W/ MENISCECTOMY Bilateral X2  LEFT /    X1  RIGHT   KNEE OPEN LATERAL RELEASE Bilateral    MOHS SURGERY Left 12/15/2017   Melanoma in situ - left calf - [facility]   ORIF ANKLE FRACTURE Right 08/04/2020   Procedure: OPEN REDUCTION INTERNAL FIXATION (ORIF) ANKLE FRACTURE;  Surgeon: Toni Arthurs, MD;  Location: [facility] ORS;  Service: Orthopedics;  Laterality: Right;  Mini C-arm, [employer] Small Frag   REPLACEMENT TOTAL KNEE Left 2006   SHOULDER ARTHROSCOPY WITH SUBACROMIAL DECOMPRESSION, ROTATOR CUFF REPAIR AND BICEP TENDON REPAIR Right 05/23/2013   Procedure: RIGHT SHOULDER ARTHROSCOPY EXAM UNDER ANESTHESIA  WITH SUBACROMIAL DECOMPRESSION,DISTAL CLAVICLE RESECTION, SADLABRAL DEBRIDEMENT CHONDROPLASTY, BICEP TENOTOMY ;  Surgeon: Eugenia Mcalpine, MD;  Location: [facility];  Service: Orthopedics;  Laterality: Right;   TONSILLECTOMY AND ADENOIDECTOMY  AGE 30   TOTAL HIP ARTHROPLASTY Left 05/04/2017   Procedure: LEFT TOTAL HIP ARTHROPLASTY ANTERIOR APPROACH;  Surgeon: Durene Romans, MD;  Location: [facility];  Service: Orthopedics;  Laterality: Left;   TOTAL HIP ARTHROPLASTY Right 08/01/2019   Procedure: TOTAL HIP ARTHROPLASTY ANTERIOR APPROACH;  Surgeon: Durene Romans, MD;  Location: [facility];  Service: Orthopedics;  Laterality: Right;    TOTAL KNEE ARTHROPLASTY Bilateral LEFT  1996/   RIGHT 2004   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      UPPER GASTROINTESTINAL ENDOSCOPY     UPPER GI ENDOSCOPY     VAGINAL HYSTERECTOMY  1976    Family Psychiatric History: mom possible depression  Family History:  Family History   Problem Relation Age of Onset   Cancer Mother    Heart disease Mother    Hypertension Mother    Melanoma Mother 58   Obesity Mother    Heart disease Father 28   Hypertension Father    Esophageal cancer Brother    Dementia Paternal Grandfather    Melanoma Niece 69   Colon cancer Neg Hx    Colon polyps Neg Hx    Stomach cancer Neg Hx    Rectal cancer Neg Hx     Social History:   Social History   Socioeconomic History   Marital status: Widowed    Spouse name: Not on file   Number of children: 3   Years of education: Not on file  Highest education level: Master's degree (e.g., MA, MS, MEng, MEd, MSW, MBA)  Occupational History   Occupation: retired Paramedic  Tobacco Use   Smoking status: Former    Packs/day: 0.50    Years: 15.00    Additional pack years: 0.00    Total pack years: 7.50    Types: Cigarettes    Quit date: 05/15/1985    Years since quitting: 37.1   Smokeless tobacco: Never  Vaping Use   Vaping Use: Never used  Substance and Sexual Activity   Alcohol use: No    Comment: RECOVERING ALCOHOLIC--  QUIT [id number]   Drug use: No   Sexual activity: Not Currently    Birth control/protection: Post-menopausal  Other Topics Concern   Not on file  Social History Narrative   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    Widowed    Three kids    6 grandchildren       Social Determinants of Health   Financial Resource Strain: Low Risk  (04/18/2021)   Overall Financial Resource Strain (CARDIA)    Difficulty of Paying Living Expenses: Not hard at all  Food Insecurity: No Food Insecurity (04/18/2021)   Hunger Vital Sign    Worried About Running Out of Food in the Last Year: Never true    Ran Out of Food in the Last Year: Never true  Transportation Needs: No Transportation Needs (04/18/2021)   PRAPARE - Administrator, Civil Service (Medical): No    Lack of Transportation (Non-Medical): No  Physical Activity:  Inactive (04/18/2021)   Exercise Vital Sign    Days of Exercise per Week: 0 days    Minutes of Exercise per Session: 0 min  Stress: No Stress Concern Present (04/18/2021)   [employer] of Occupational Health - Occupational Stress Questionnaire    Feeling of Stress : Not at all  Social Connections: Moderately Integrated (04/18/2021)   Social Connection and Isolation Panel [NHANES]    Frequency of Communication with Friends and Family: More than three times a week    Frequency of Social Gatherings with Friends and Family: More than three times a week    Attends Religious Services: More than 4 times per year    Active Member of [employer] or Organizations: Yes    Attends Banker Meetings: More than 4 times per year    Marital Status: Widowed     Allergies:   Allergies  Allergen Reactions   Nsaids Other (See Comments) and Anaphylaxis    SEVERE STOMACH CRAMPS, MOUTH SORES  **Able to tolerate Tylenol   Otezla [Apremilast] Anaphylaxis    Suicidal ideation   Penicillins Anaphylaxis and Other (See Comments)    Has patient had a PCN reaction causing immediate rash, facial/tongue/throat swelling, SOB or lightheadedness with hypotension:  Yes  Has patient had a PCN reaction causing severe rash involving mucus membranes or skin necrosis: Yes  Has patient had a PCN reaction that required hospitalization: No  Has patient had a PCN reaction occurring within the last 10 year No  If all of the above answers are "NO", then may proceed with Cephalosporin use.   Erythromycin Other (See Comments)    SEVERE STOMACH CRAMPS   Morphine And Related Nausea And Vomiting   Penicillamine Other (See Comments)   Remicade [Infliximab] Other (See Comments)    Shut down immune system-BP high   Risankizumab Other (See Comments)    URI, fever, cough, UTI   Tolmetin  SEVERE STOMACH CRAMPS   Nickel Rash    Including snaps on hospital gowns     Metabolic Disorder Labs: Lab Results  Component  Value Date   HGBA1C 5.3 01/29/2022   No results found for: "PROLACTIN" Lab Results  Component Value Date   CHOL 147 01/29/2022   TRIG 84 01/29/2022   HDL 61 01/29/2022   CHOLHDL 2.7 06/11/2021   VLDL 16.1 04/24/2021   LDLCALC 70 01/29/2022   LDLCALC 90 06/11/2021     Current Medications: Current Outpatient Medications  Medication Sig Dispense Refill   acetaminophen (TYLENOL) 325 MG tablet Take 650 mg by mouth every 6 (six) hours as needed for moderate pain.     Antiseborrheic Products, Misc. (PROMISEB) CREA Uses topical daily as needed on her face     Ascorbic Acid (VITAMIN C) 1000 MG tablet Take 1,000 mg by mouth daily.     atorvastatin (LIPITOR) 40 MG tablet Take 1 tablet (40 mg total) by mouth daily. 90 tablet 3   betamethasone dipropionate 0.05 % lotion Apply topically 2 (two) times daily. 60 mL 0   betamethasone, augmented, (DIPROLENE) 0.05 % lotion Apply topically 2 (two) times daily. 60 mL 0   ELIQUIS 5 MG TABS tablet TAKE 1 TABLET BY MOUTH TWICE A DAY 180 tablet 1   famotidine (PEPCID) 20 MG tablet Take 1 tablet (20 mg total) by mouth daily as needed for heartburn or indigestion. (Patient taking differently: Take 20 mg by mouth daily.) 90 tablet 3   FIBER PO Take 1 capsule by mouth daily. Pt takes in the evening.     HYDROcodone-acetaminophen (NORCO/VICODIN) 5-325 MG tablet 1 tablet as needed     hydrocortisone cream 1 % Apply 1 application topically daily as needed for itching.     L-Methylfolate-B12-B6-B2 (CEREFOLIN) 07-17-48-5 MG TABS Take 1 tablet by mouth daily. 90 tablet 1   losartan (COZAAR) 100 MG tablet TAKE 1 TABLET BY MOUTH EVERY DAY 90 tablet 2   metFORMIN (GLUCOPHAGE) 500 MG tablet Take 1 tablet (500 mg total) by mouth 2 (two) times daily with a meal. 60 tablet 0   metoprolol tartrate (LOPRESSOR) 25 MG tablet TAKE 0.5 TABLETS BY MOUTH 2 TIMES DAILY. 90 tablet 1   omeprazole (PRILOSEC) 40 MG capsule TAKE 1 CAPSULE (40 MG TOTAL) BY MOUTH DAILY. 90 capsule 2    rizatriptan (MAXALT) 10 MG tablet Take 1 tablet (10 mg total) by mouth as needed for migraine. May repeat in 2 hours if needed 10 tablet 2   sulfamethoxazole-trimethoprim (BACTRIM,SEPTRA) 400-80 MG tablet Take 1 tablet by mouth daily. For urethritis.     tamoxifen (NOLVADEX) 10 MG tablet Take 1/2 tablet by mouth in the evening     traZODone (DESYREL) 50 MG tablet TAKE 1 TABLET BY MOUTH EVERYDAY AT BEDTIME 90 tablet 1   venlafaxine XR (EFFEXOR XR) 75 MG 24 hr capsule Take 1 capsule (75 mg total) by mouth daily. 30 capsule 0   Vitamin D, Ergocalciferol, (DRISDOL) 1.25 MG (50000 UNIT) CAPS capsule Take 1 capsule (50,000 Units total) by mouth every 7 (seven) days. 4 capsule 0   No current facility-administered medications for this visit.     Psychiatric Specialty Exam: Review of Systems  Cardiovascular:  Negative for chest pain.  Psychiatric/Behavioral:  Negative for depression and suicidal ideas.     There were no vitals taken for this visit.There is no height or weight on file to calculate BMI.  General Appearance: casual  Eye Contact:  fair  Speech:  Normal Rate  Volume:  Decreased  Mood: some better  Affect: congruent  Thought Process:  Goal Directed  Orientation:  Full (Time, Place, and Person)  Thought Content:  Rumination  Suicidal Thoughts:  No  Homicidal Thoughts:  No  Memory:  Immediate;   Fair Recent;   Fair  Judgement:  Fair  Insight:  Fair  Psychomotor Activity:  Normal  Concentration:  Concentration: Fair and Attention Span: Fair  Recall:  [employer] of Knowledge:Good  Language: Good  Akathisia:  No  Handed:  Right  AIMS (if indicated):    Assets:  Desire for Improvement  ADL's:  Intact  Cognition: Impaired,  Mild  Sleep:  Variable to fair on meds    Treatment Plan Summary: Medication management and Plan as follows    Prior documentation reviewed  1. MDD, mild to moderate: better continue effexor now 75mg  2. Insomnia: manageable on trazadone,  continue Alcohol dependence:sustained remission also does sppnsor, continue attending groups, it helps  Fu 89m. Call for refills     Thresa Ross, MD 4/29/20242:17 PM

## 2022-06-16 DIAGNOSIS — Z08 Encounter for follow-up examination after completed treatment for malignant neoplasm: Secondary | ICD-10-CM | POA: Diagnosis not present

## 2022-06-16 DIAGNOSIS — L84 Corns and callosities: Secondary | ICD-10-CM | POA: Diagnosis not present

## 2022-06-16 DIAGNOSIS — Z872 Personal history of diseases of the skin and subcutaneous tissue: Secondary | ICD-10-CM | POA: Diagnosis not present

## 2022-06-16 DIAGNOSIS — Z86006 Personal history of melanoma in-situ: Secondary | ICD-10-CM | POA: Diagnosis not present

## 2022-06-16 DIAGNOSIS — L218 Other seborrheic dermatitis: Secondary | ICD-10-CM | POA: Diagnosis not present

## 2022-06-16 DIAGNOSIS — D225 Melanocytic nevi of trunk: Secondary | ICD-10-CM | POA: Diagnosis not present

## 2022-06-16 DIAGNOSIS — R208 Other disturbances of skin sensation: Secondary | ICD-10-CM | POA: Diagnosis not present

## 2022-06-16 DIAGNOSIS — L814 Other melanin hyperpigmentation: Secondary | ICD-10-CM | POA: Diagnosis not present

## 2022-06-16 DIAGNOSIS — L821 Other seborrheic keratosis: Secondary | ICD-10-CM | POA: Diagnosis not present

## 2022-06-17 ENCOUNTER — Ambulatory Visit (INDEPENDENT_AMBULATORY_CARE_PROVIDER_SITE_OTHER): Payer: Medicare Other | Admitting: Adult Health

## 2022-06-17 VITALS — BP 128/70 | HR 53 | Temp 97.9°F | Ht 65.0 in | Wt 208.0 lb

## 2022-06-17 DIAGNOSIS — E782 Mixed hyperlipidemia: Secondary | ICD-10-CM | POA: Diagnosis not present

## 2022-06-17 DIAGNOSIS — I4891 Unspecified atrial fibrillation: Secondary | ICD-10-CM

## 2022-06-17 DIAGNOSIS — C4372 Malignant melanoma of left lower limb, including hip: Secondary | ICD-10-CM | POA: Diagnosis not present

## 2022-06-17 DIAGNOSIS — I10 Essential (primary) hypertension: Secondary | ICD-10-CM

## 2022-06-17 DIAGNOSIS — D0512 Intraductal carcinoma in situ of left breast: Secondary | ICD-10-CM

## 2022-06-17 DIAGNOSIS — G3184 Mild cognitive impairment, so stated: Secondary | ICD-10-CM | POA: Diagnosis not present

## 2022-06-17 DIAGNOSIS — G4700 Insomnia, unspecified: Secondary | ICD-10-CM | POA: Diagnosis not present

## 2022-06-17 DIAGNOSIS — Z6834 Body mass index (BMI) 34.0-34.9, adult: Secondary | ICD-10-CM | POA: Diagnosis not present

## 2022-06-17 DIAGNOSIS — E6609 Other obesity due to excess calories: Secondary | ICD-10-CM

## 2022-06-17 DIAGNOSIS — R1310 Dysphagia, unspecified: Secondary | ICD-10-CM

## 2022-06-17 IMAGING — CT CT ANKLE*R* W/O CM
3 series · 12 of 33 positions shown, 14 images · non-contrast
Comparison: Radiographs 07/31/2020.

CLINICAL DATA: Trimalleolar right ankle fracture post reduction.

EXAM:
CT OF THE RIGHT ANKLE WITHOUT CONTRAST
TECHNIQUE: Multidetector CT imaging of the right ankle was performed according
to the standard protocol. Multiplanar CT image reconstructions were
also generated.

[Series 4: axial st · axial · 0.33mm/px · z∈[-1234,-1120]mm · 4 of 83 slices shown, 5 images]
[im 13/83  soft-tissue]
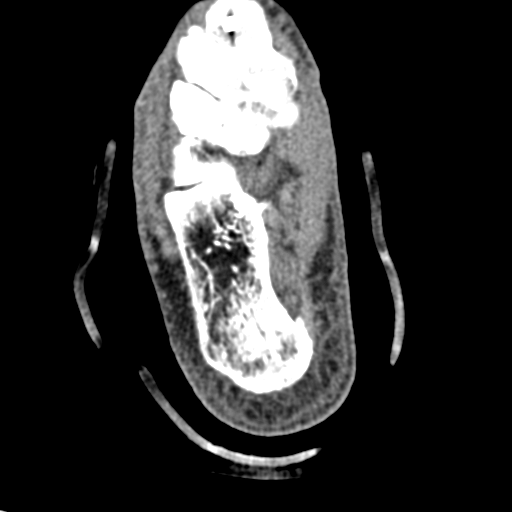
[im 13/83  bone]
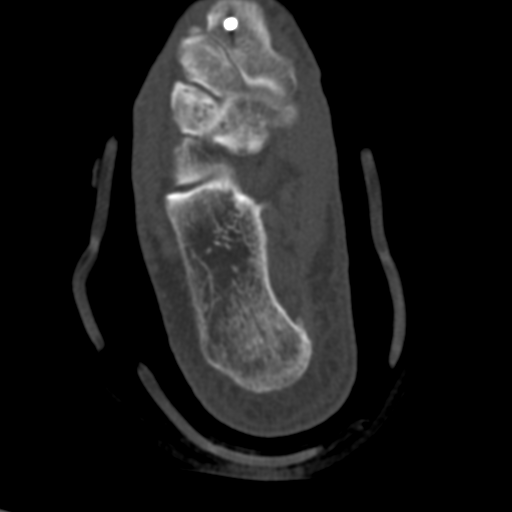
[im 32/83  bone]
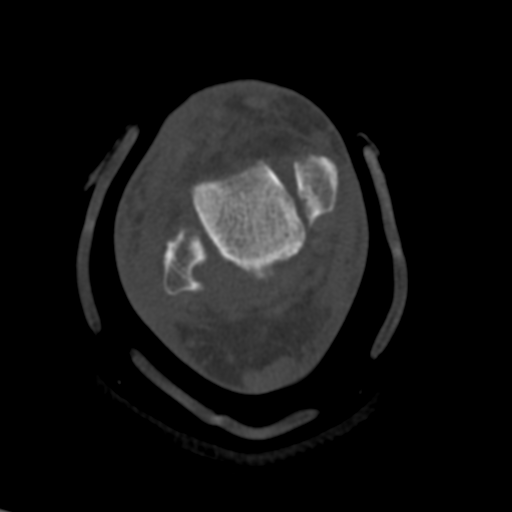
[im 51/83  bone]
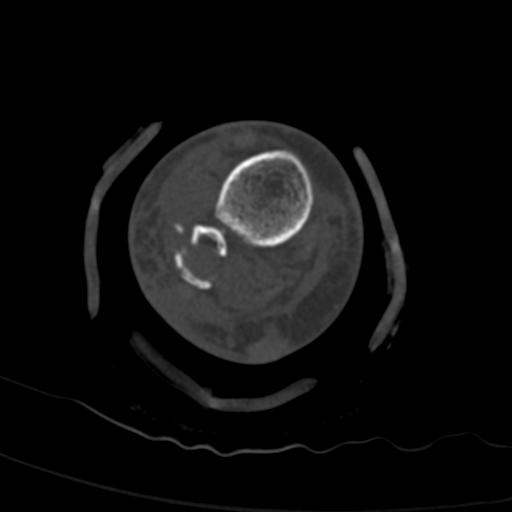
[im 70/83  bone]
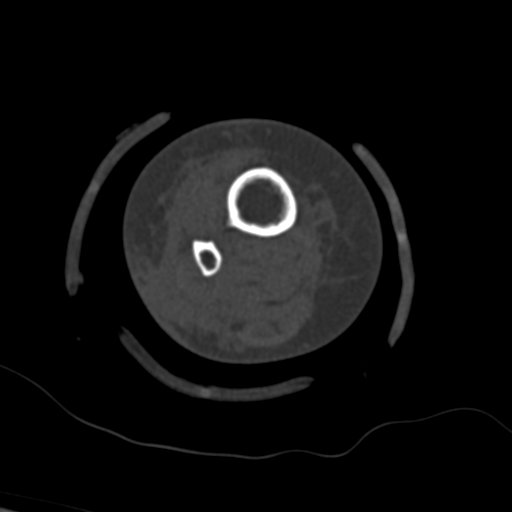

[Series 9: coronal st · coronal · 0.21mm/px · 3 of 64 slices shown]
[im 13/64  bone]
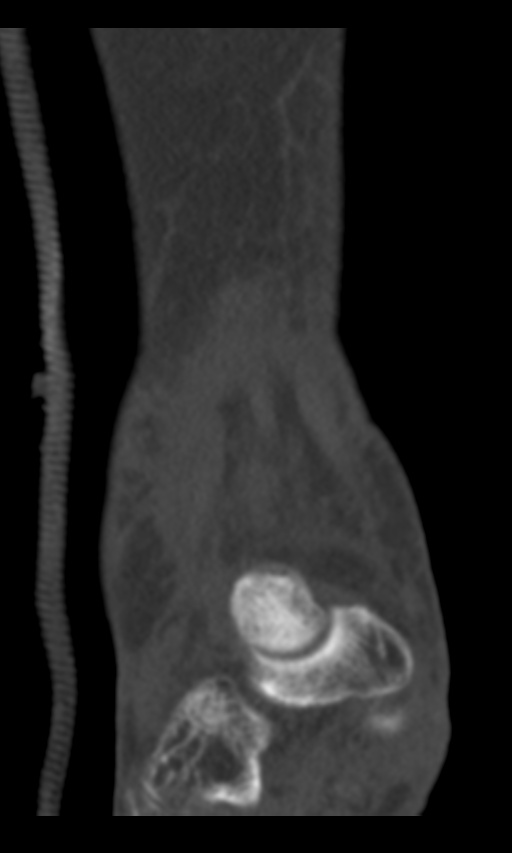
[im 26/64  bone]
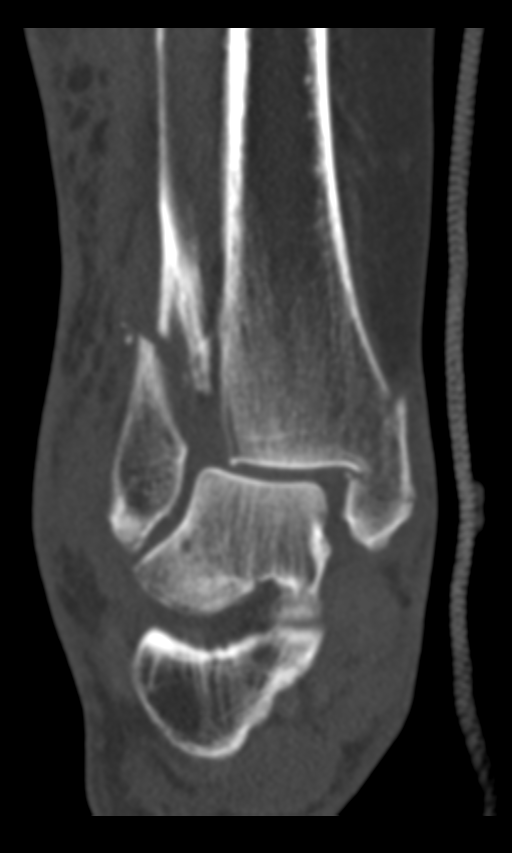
[im 38/64  bone]
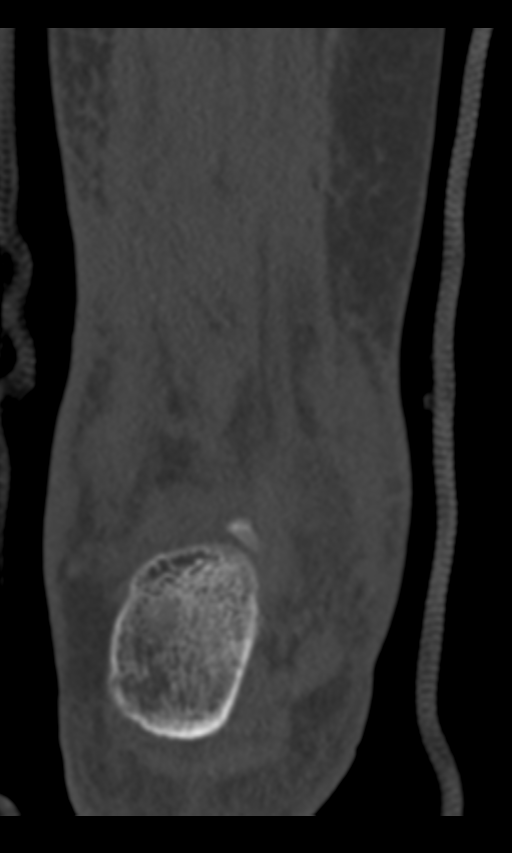

[Series 10: sagittal st · sagittal · 0.25mm/px · 5 of 55 slices shown, 6 images]
[im 19/55  bone]
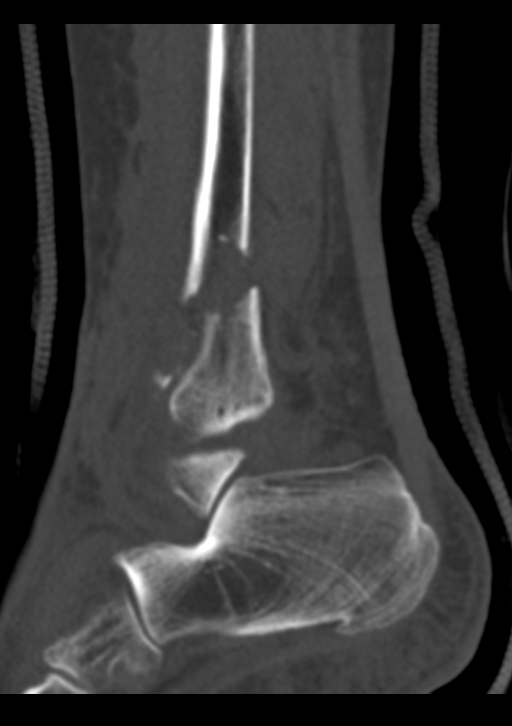
[im 23/55  bone]
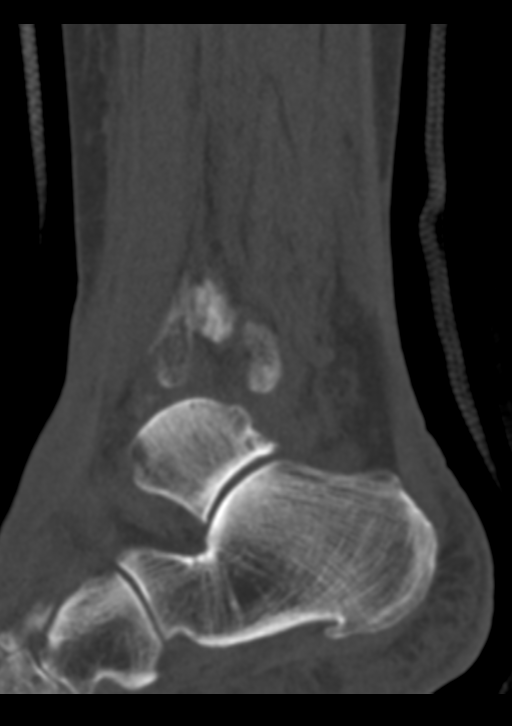
[im 28/55  soft-tissue]
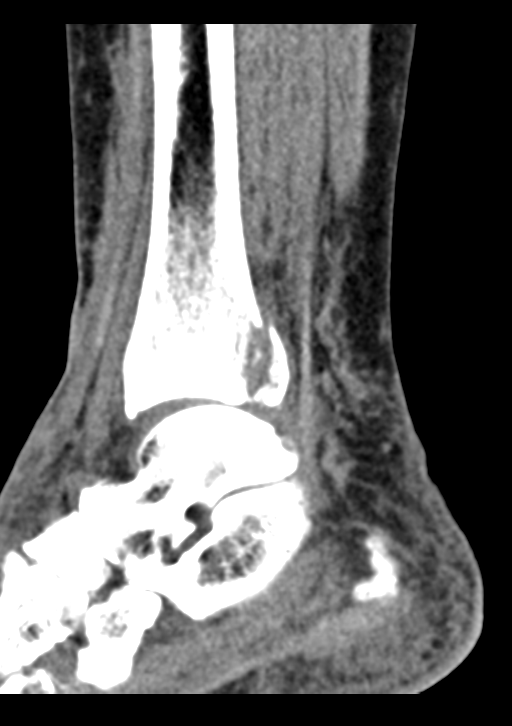
[im 28/55  bone]
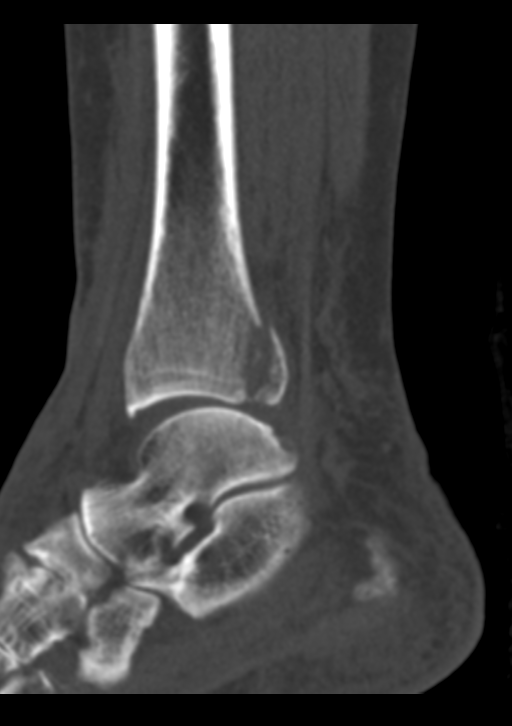
[im 32/55  bone]
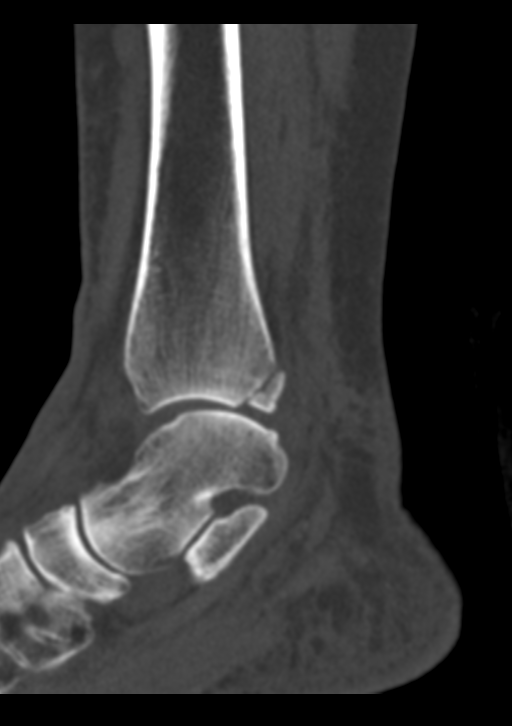
[im 37/55  bone]
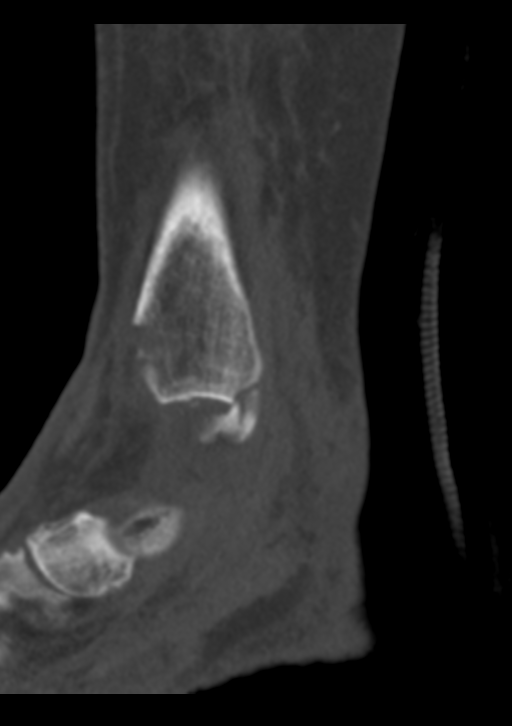

[12 of 33 positions shown; findings below may reference images not displayed]

FINDINGS: Bones/Joint/Cartilage

The ankle is splinted. The previously demonstrated posterior
tibiotalar dislocation has been reduced. Mildly comminuted and
displaced fracture of the posterior tibial plafond demonstrates 3 mm
of impaction of the articular surface posteriorly. This component of
the fracture extends into the distal tibiofibular joint which is
mildly widened. There is a mildly displaced and mildly comminuted
fracture through the base of the medial malleolus which extends to
the articular surface of the tibial plafond anteriorly. There is
mild residual anteromedial widening of the tibiotalar joint with
interposed small fracture fragments and a small ankle joint
effusion.

There is a comminuted and mildly displaced fracture of the distal
fibular shaft which is located 1.8 cm proximal to the ankle mortise.
This fracture demonstrates up to 7 mm of residual lateral
displacement.

The talar dome and additional visualized tarsal bones appear intact.
There are postsurgical changes related to previous 1st
tarsometatarsal arthrodesis. Moderately advanced midfoot
degenerative changes are noted.

Ligaments

Suboptimally assessed by CT.

Muscles and Tendons

The ankle tendons appear grossly intact without entrapment in the
fractures.

Soft tissues

Moderate soft tissue swelling surrounding the fractures in the
distal lower leg. No evidence of foreign body, soft tissue emphysema
or significant hematoma.
IMPRESSION: 1. Residual displacement of trimalleolar fracture as described. The
tibiotalar dislocation has been reduced, although there is mild
residual widening of the ankle mortise and tibiotalar joint space
widening anteromedially with interposed small fracture fragments.
2. No evidence of tarsal bone fracture.
3. Moderate midfoot degenerative changes.
4. No gross tendon rupture or entrapment identified.

## 2022-06-17 IMAGING — DX DG ANKLE PORT 2V*R*
2 series · 2 of 2 positions shown · non-contrast
Comparison: 07/31/2020

CLINICAL DATA: Post reduction

EXAM:
PORTABLE RIGHT ANKLE - 2 VIEW

[ankle ap]
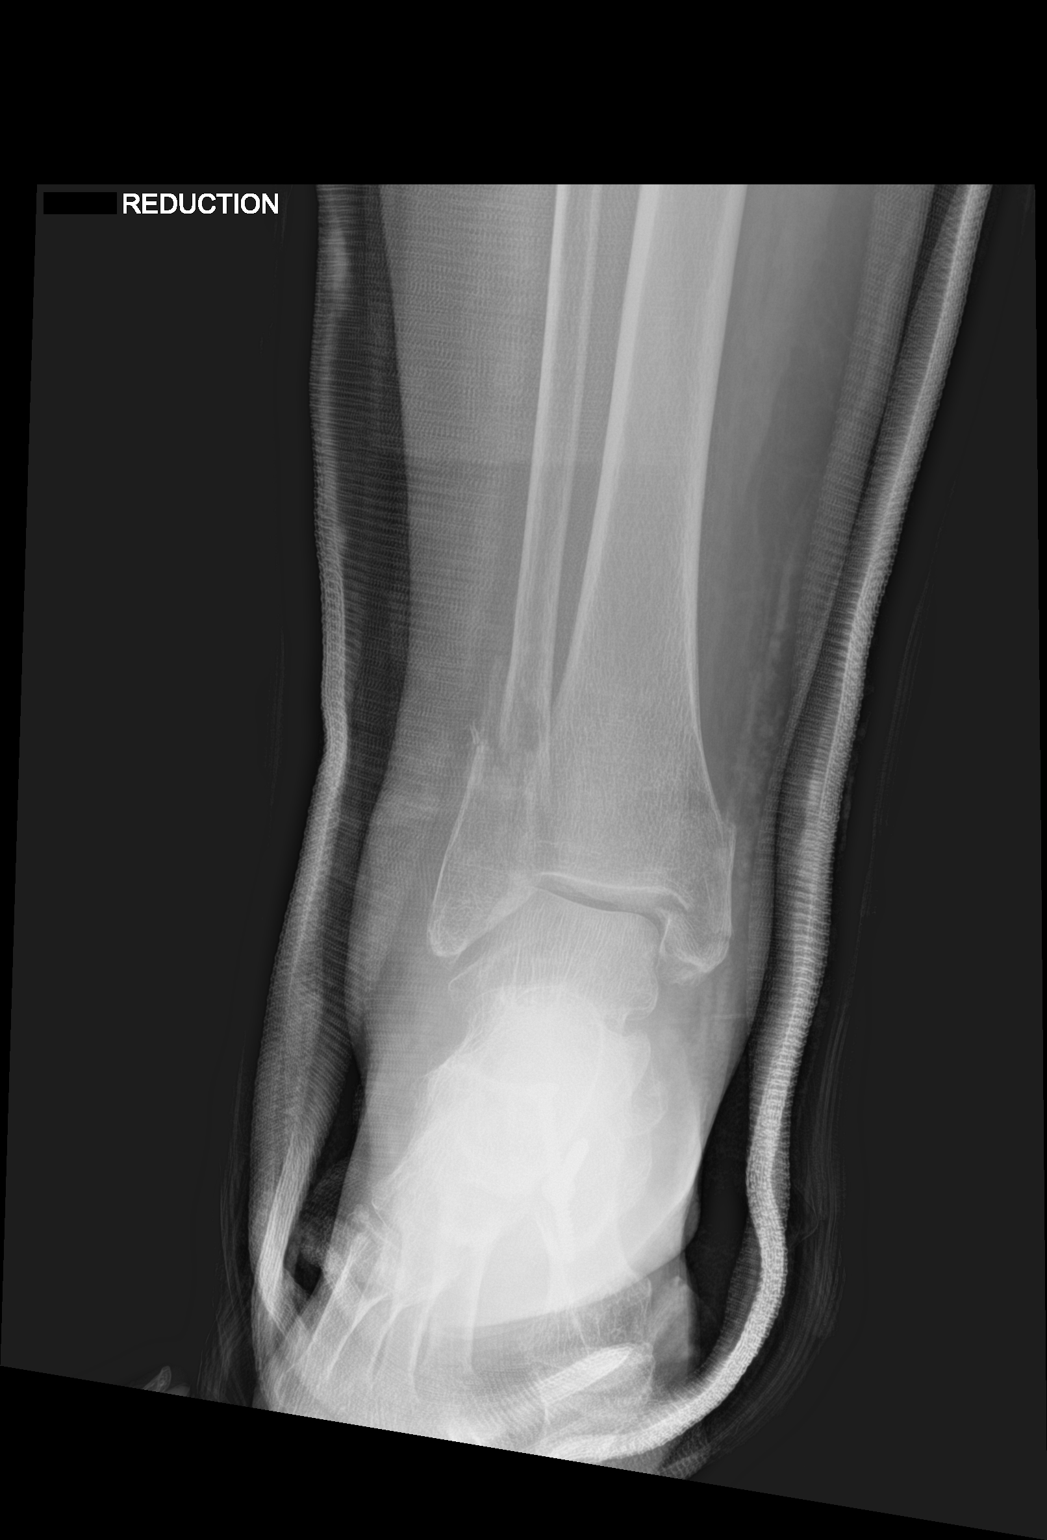

[ankle obl]
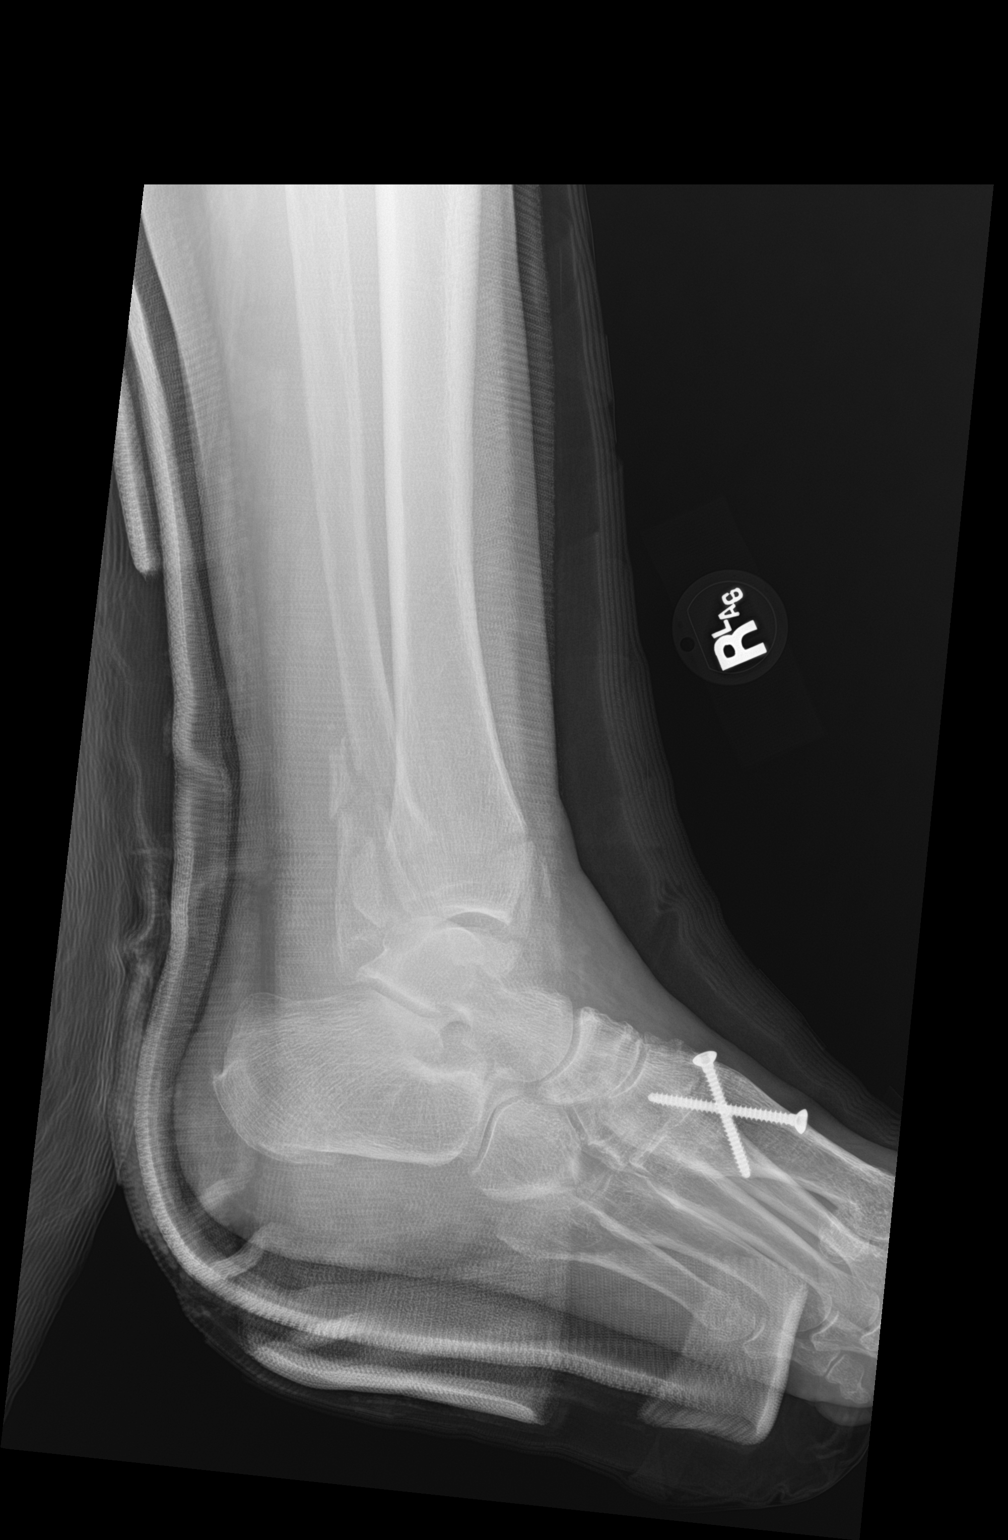

[2 of 2 positions shown; findings below may reference images not displayed]

FINDINGS: Casting material limits bone detail. Redemonstrated trimalleolar
fracture. Comminuted distal fibular fracture with decreased
angulation, but with residual [DATE] shaft diameter posterior and [DATE]
shaft diameter lateral displacement of largest fracture fragment.
Comminuted medial malleolar fracture with overall decreased
displacement. Displaced posterior malleolar fracture fragment.
Reduction of previously noted ankle dislocation but with residual
mild posterior and lateral subluxation of the talar dome with
respect to the distal tibia. Residual asymmetric widening of the
superomedial mortise and anterior ankle joint. Fixating hardware at
the base of the great toe.
IMPRESSION: 1. Trimalleolar fracture with overall decreased fracture
displacement compared to prior. Reduction of ankle dislocation
though with residual mild lateral and posterior subluxation of the
talar dome with respect to the distal tibia

## 2022-06-17 MED ORDER — LOSARTAN POTASSIUM 100 MG PO TABS: 100.0000 mg | ORAL_TABLET | Freq: Every day | ORAL | 3 refills | Status: DC

## 2022-06-17 MED ORDER — TRAZODONE HCL 50 MG PO TABS
ORAL_TABLET | ORAL | 1 refills | Status: DC
Start: 2022-06-17 — End: 2023-02-11

## 2022-06-17 NOTE — Progress Notes (Signed)
Subjective:    Patient ID: Tonya Bartlett, female    DOB: [birth year], 77 y.o.   MRN: [id number]  HPI Patient presents for yearly preventative medicine examination. She is a pleasant 77 year old female who  has a past medical history of Alcohol abuse, Allergic rhinitis, Allergy, Anemia, Anxiety, Blood transfusion without reported diagnosis, Breast cancer (HCC) (12/2020), Cancer (HCC), Cataract, Clotting disorder (HCC), Colitis, Complication of anesthesia, Depression, Diverticulosis, Dysrhythmia, Fatigue, Frequency of urination, GERD (gastroesophageal reflux disease), History of colon polyps, History of DVT of lower extremity, History of hiatal hernia, History of iron deficiency anemia (1996), History of migraine headaches, History of radiation therapy, History of rib fracture, Hyperlipidemia, Hypertension, IBS (irritable bowel syndrome), Insomnia, Joint pain, MCI (mild cognitive impairment), Melanoma (HCC) (2019), Migraine headache, OA (osteoarthritis), Osteopenia, Pneumonia, PONV (postoperative nausea and vomiting), Recovering alcoholic (HCC), Right rotator cuff tear, SOB (shortness of breath) on exertion, Substance abuse (HCC), Swallowing difficulty, and Unspecified essential hypertension.  Breast Cancer -diagnosed in 2022.  She has undergone a lumpectomy.  Completed radiation treatment about a year ago.   She is on tamoxifen x5 years on 04/30/2021. She is doing well overall  HTN -prescribed Cozaar 100mg  daily. BP Readings from Last 3 Encounters:  06/17/22 128/70  06/04/22 133/80  05/25/22 110/74   AFib -takes Eliquis 5 mg twice daily and metoprolol 12.5 mg twice daily.  She does feel well controlled overall, has infrequent palpitations. No racing heart, palpitations, or chest pain. She has an appointment with Cardiology on May 14th to discuss Watchmen Procedure.   Hyperlipidemia-currently prescribed Lipitor 40 mg daily.  She denies myalgia or fatigue Lab Results  Component Value Date   CHOL 147  01/29/2022   HDL 61 01/29/2022   LDLCALC 70 01/29/2022   LDLDIRECT 187.9 10/08/2009   TRIG 84 01/29/2022   CHOLHDL 2.7 06/11/2021    History of melanoma-diagnosed in October 2019.  She is seen by dermatology on a routine basis  Mild cognitive impairment-followed by neurology.  Currently prescribed Cerefolin.  Does have some occasional word finding difficulties and mild short-term memory loss.  Anxiety and depression-seen by behavioral health and currently prescribed effexor 75 mg.   Insomnia-takes trazodone 50mg  QHS.   Obesity -managed by healthy weight and wellness clinic.  She has been able to lose roughly 19 pounds since starting this program.  Healthy weight and wellness put her on metformin and vitamin D.  She is having some esophageal constriction, she has an appointment on May 13th She has not been able to eat much protein since it is difficult to swallow it.  All immunizations and health maintenance protocols were reviewed with the patient and needed orders were placed.  Appropriate screening laboratory values were ordered for the patient including screening of hyperlipidemia, renal function and hepatic function.  Medication reconciliation,  past medical history, social history, problem list and allergies were reviewed in detail with the patient  Goals were established with regard to weight loss, exercise, and  diet in compliance with medications  Review of Systems  Constitutional:  Positive for appetite change.  HENT:  Positive for trouble swallowing.   Eyes: Negative.   Respiratory: Negative.    Cardiovascular: Negative.   Gastrointestinal: Negative.   Endocrine: Negative.   Genitourinary: Negative.   Musculoskeletal: Negative.   Skin: Negative.   Allergic/Immunologic: Negative.   Neurological: Negative.   Hematological: Negative.   Psychiatric/Behavioral:  Positive for dysphoric mood and sleep disturbance.    Past Medical History:  Diagnosis Date  Alcohol abuse     Sober 41 years.   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  1996   History of hiatal hernia    History of iron deficiency anemia 1996   iron infusion   History of migraine headaches    History of radiation therapy    left breast 03/27/2021-04/23/2021 Dr Antony Blackbird   History of rib fracture    Hyperlipidemia    Hypertension    IBS (irritable bowel syndrome)    Insomnia    Joint pain    MCI (mild cognitive impairment)    Melanoma (HCC) 2019   left leg    Migraine headache    hx of migraines    OA (osteoarthritis)    RIGHT SHOULDER   Osteopenia    Pneumonia    remote history   PONV (postoperative nausea and vomiting)    Recovering alcoholic (HCC)    SINCE 01-21-1980   Right rotator cuff tear    SOB (shortness of breath) on exertion    Substance abuse (HCC)    recovering alcoholic   Swallowing difficulty    Unspecified essential hypertension     Social History   Socioeconomic History   Marital status: Widowed    Spouse name: Not on file   Number of children: 3   Years of education: Not on file   Highest education level: Master's degree (e.g., MA, MS, MEng, MEd, MSW, MBA)  Occupational History   Occupation: retired Paramedic  Tobacco Use   Smoking status: Former    Packs/day: 0.50    Years: 15.00    Additional pack years: 0.00    Total pack years: 7.50    Types: Cigarettes    Quit date: 05/15/1985    Years since quitting: 37.1    Smokeless tobacco: Never  Vaping Use   Vaping Use: Never used  Substance and Sexual Activity   Alcohol use: No    Comment: RECOVERING ALCOHOLIC--  QUIT [id number]   Drug use: No   Sexual activity: Not Currently    Birth control/protection: Post-menopausal  Other Topics Concern   Not on file  Social History Narrative   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    Widowed    Three kids    6 grandchildren       Social Determinants of Health   Financial Resource Strain: Low Risk  (04/18/2021)   Overall Financial Resource Strain (CARDIA)    Difficulty of Paying Living Expenses: Not hard at all  Food Insecurity: No Food Insecurity (04/18/2021)   Hunger Vital Sign    Worried About Running Out of Food in the Last Year: Never true    Ran Out of Food in the Last Year: Never true  Transportation Needs: No Transportation Needs (04/18/2021)   PRAPARE - Administrator, Civil Service (Medical): No    Lack of Transportation (Non-Medical): No  Physical Activity: Inactive (04/18/2021)   Exercise Vital Sign    Days of Exercise per Week: 0 days    Minutes of Exercise per Session: 0 min  Stress: No Stress Concern Present (04/18/2021)   [employer] of Occupational Health - Occupational Stress Questionnaire    Feeling of Stress : Not at all  Social Connections: Moderately Integrated (04/18/2021)   Social Connection and Isolation Panel [NHANES]    Frequency of Communication with Friends and Family: More than three times a week    Frequency of Social Gatherings with Friends and Family: More than three times a week    Attends Religious Services: More than 4 times per year    Active Member of [employer] or Organizations: Yes    Attends Banker Meetings: More than 4 times per year    Marital Status: Widowed  Intimate Partner Violence: Not At Risk (04/18/2021)   Humiliation, Afraid, Rape, and Kick questionnaire    Fear of Current or Ex-Partner: No     Emotionally Abused: No    Physically Abused: No    Sexually Abused: No    Past Surgical History:  Procedure Laterality Date   BREAST BIOPSY Left 01/06/2021   pos   BREAST LUMPECTOMY WITH RADIOACTIVE SEED LOCALIZATION Left 02/27/2021   Procedure: LEFT BREAST LUMPECTOMY WITH RADIOACTIVE SEED LOCALIZATION;  Surgeon: Almond Lint, MD;  Location: [facility] OR;  Service: General;  Laterality: Left;   BUNIONECTOMY/  HAMMERTOE CORRECTION  RIGHT FOOT  2011   CATARACT EXTRACTION W/ INTRAOCULAR LENS  IMPLANT, BILATERAL     COLONOSCOPY     DILATION AND CURETTAGE OF UTERUS  1976   EYE SURGERY Bilateral 2016   cataract removal   KNEE ARTHROSCOPY W/ MENISCECTOMY Bilateral X2  LEFT /    X1  RIGHT   KNEE OPEN LATERAL RELEASE Bilateral    MOHS SURGERY Left 12/15/2017   Melanoma in situ - [facility] calf - [facility]   ORIF ANKLE FRACTURE Right 08/04/2020   Procedure: OPEN REDUCTION INTERNAL FIXATION (ORIF) ANKLE FRACTURE;  Surgeon: Toni Arthurs, MD;  Location: [facility];  Service: Orthopedics;  Laterality: Right;  Mini C-arm, [employer] Small Frag   REPLACEMENT TOTAL KNEE Left 2006   SHOULDER ARTHROSCOPY WITH SUBACROMIAL DECOMPRESSION, ROTATOR CUFF REPAIR AND BICEP TENDON REPAIR Right 05/23/2013   Procedure: RIGHT SHOULDER ARTHROSCOPY EXAM UNDER ANESTHESIA  WITH SUBACROMIAL DECOMPRESSION,DISTAL CLAVICLE RESECTION, SADLABRAL DEBRIDEMENT CHONDROPLASTY, BICEP TENOTOMY ;  Surgeon: Eugenia Mcalpine, MD;  Location: [facility];  Service: Orthopedics;  Laterality: Right;   TONSILLECTOMY AND ADENOIDECTOMY  AGE 23   TOTAL HIP ARTHROPLASTY Left 05/04/2017   Procedure: LEFT TOTAL HIP ARTHROPLASTY ANTERIOR APPROACH;  Surgeon: Durene Romans, MD;  Location: [facility];  Service: Orthopedics;  Laterality: Left;   TOTAL HIP ARTHROPLASTY Right 08/01/2019   Procedure: TOTAL HIP ARTHROPLASTY ANTERIOR APPROACH;  Surgeon: Durene Romans, MD;  Location: [facility];  Service: Orthopedics;  Laterality: Right;    TOTAL  KNEE ARTHROPLASTY Bilateral LEFT  1996/   RIGHT 2004   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      UPPER GASTROINTESTINAL ENDOSCOPY     UPPER GI ENDOSCOPY     VAGINAL HYSTERECTOMY  1976    Family History  Problem Relation Age of Onset   Cancer Mother    Heart disease Mother  Hypertension Mother    Melanoma Mother 46   Obesity Mother    Heart disease Father 71   Hypertension Father    Esophageal cancer Brother    Dementia Paternal Grandfather    Melanoma Niece 9   Colon cancer Neg Hx    Colon polyps Neg Hx    Stomach cancer Neg Hx    Rectal cancer Neg Hx     Allergies  Allergen Reactions   Nsaids Other (See Comments) and Anaphylaxis    SEVERE STOMACH CRAMPS, MOUTH SORES  **Able to tolerate Tylenol   Otezla [Apremilast] Anaphylaxis    Suicidal ideation   Penicillins Anaphylaxis and Other (See Comments)    Has patient had a PCN reaction causing immediate rash, facial/tongue/throat swelling, SOB or lightheadedness with hypotension:  Yes  Has patient had a PCN reaction causing severe rash involving mucus membranes or skin necrosis: Yes  Has patient had a PCN reaction that required hospitalization: No  Has patient had a PCN reaction occurring within the last 10 year No  If all of the above answers are "NO", then may proceed with Cephalosporin use.   Erythromycin Other (See Comments)    SEVERE STOMACH CRAMPS   Morphine And Related Nausea And Vomiting   Penicillamine Other (See Comments)   Remicade [Infliximab] Other (See Comments)    Shut down immune system-BP high   Risankizumab Other (See Comments)    URI, fever, cough, UTI   Tolmetin     SEVERE STOMACH CRAMPS   Nickel Rash    Including snaps on hospital gowns     Current Outpatient Medications on File Prior to Visit  Medication Sig Dispense Refill   acetaminophen (TYLENOL) 325 MG tablet Take 650 mg by mouth every 6 (six) hours as needed for moderate pain.     Antiseborrheic Products, Misc. (PROMISEB) CREA Uses  topical daily as needed on her face     Ascorbic Acid (VITAMIN C) 1000 MG tablet Take 1,000 mg by mouth daily.     atorvastatin (LIPITOR) 40 MG tablet Take 1 tablet (40 mg total) by mouth daily. 90 tablet 3   betamethasone dipropionate 0.05 % lotion Apply topically 2 (two) times daily. 60 mL 0   betamethasone, augmented, (DIPROLENE) 0.05 % lotion Apply topically 2 (two) times daily. 60 mL 0   ELIQUIS 5 MG TABS tablet TAKE 1 TABLET BY MOUTH TWICE A DAY 180 tablet 1   famotidine (PEPCID) 20 MG tablet Take 1 tablet (20 mg total) by mouth daily as needed for heartburn or indigestion. (Patient taking differently: Take 20 mg by mouth daily.) 90 tablet 3   FIBER PO Take 1 capsule by mouth daily. Pt takes in the evening.     HYDROcodone-acetaminophen (NORCO/VICODIN) 5-325 MG tablet 1 tablet as needed     hydrocortisone cream 1 % Apply 1 application topically daily as needed for itching.     L-Methylfolate-B12-B6-B2 (CEREFOLIN) 07-17-48-5 MG TABS Take 1 tablet by mouth daily. 90 tablet 1   losartan (COZAAR) 100 MG tablet TAKE 1 TABLET BY MOUTH EVERY DAY 90 tablet 2   metFORMIN (GLUCOPHAGE) 500 MG tablet Take 1 tablet (500 mg total) by mouth 2 (two) times daily with a meal. 60 tablet 0   metoprolol tartrate (LOPRESSOR) 25 MG tablet TAKE 0.5 TABLETS BY MOUTH 2 TIMES DAILY. 90 tablet 1   omeprazole (PRILOSEC) 40 MG capsule TAKE 1 CAPSULE (40 MG TOTAL) BY MOUTH DAILY. 90 capsule 2   rizatriptan (MAXALT) 10 MG tablet Take 1  tablet (10 mg total) by mouth as needed for migraine. May repeat in 2 hours if needed 10 tablet 2   sulfamethoxazole-trimethoprim (BACTRIM,SEPTRA) 400-80 MG tablet Take 1 tablet by mouth daily. For urethritis.     tamoxifen (NOLVADEX) 10 MG tablet Take 1/2 tablet by mouth in the evening     traZODone (DESYREL) 50 MG tablet TAKE 1 TABLET BY MOUTH EVERYDAY AT BEDTIME 90 tablet 1   venlafaxine XR (EFFEXOR XR) 75 MG 24 hr capsule Take 1 capsule (75 mg total) by mouth daily. 30 capsule 0   Vitamin  D, Ergocalciferol, (DRISDOL) 1.25 MG (50000 UNIT) CAPS capsule Take 1 capsule (50,000 Units total) by mouth every 7 (seven) days. 4 capsule 0   No current facility-administered medications on file prior to visit.    BP 128/70   Pulse (!) 53   Temp 97.9 F (36.6 C) (Oral)   Ht 5\' 5"  (1.651 m)   Wt 208 lb (94.3 kg)   SpO2 93%   BMI 34.61 kg/m       Objective:   Physical Exam Vitals and nursing note reviewed.  Constitutional:      General: She is not in acute distress.    Appearance: Normal appearance. She is not ill-appearing.  HENT:     Head: Normocephalic and atraumatic.     Right Ear: Tympanic membrane, ear canal and external ear normal. There is no impacted cerumen.     Left Ear: Tympanic membrane, ear canal and external ear normal. There is no impacted cerumen.     Nose: Nose normal. No congestion or rhinorrhea.     Mouth/Throat:     Mouth: Mucous membranes are moist.     Pharynx: Oropharynx is clear.  Eyes:     Extraocular Movements: Extraocular movements intact.     Conjunctiva/sclera: Conjunctivae normal.     Pupils: Pupils are equal, round, and reactive to light.  Neck:     Vascular: No carotid bruit.  Cardiovascular:     Rate and Rhythm: Normal rate and regular rhythm.     Pulses: Normal pulses.     Heart sounds: No murmur heard.    No friction rub. No gallop.  Pulmonary:     Effort: Pulmonary effort is normal.     Breath sounds: Normal breath sounds.  Abdominal:     General: Abdomen is flat. Bowel sounds are normal. There is no distension.     Palpations: Abdomen is soft. There is no mass.     Tenderness: There is no abdominal tenderness. There is no guarding or rebound.     Hernia: No hernia is present.  Musculoskeletal:        General: Normal range of motion.     Cervical back: Normal range of motion and neck supple.  Lymphadenopathy:     Cervical: No cervical adenopathy.  Skin:    General: Skin is warm and dry.     Capillary Refill: Capillary  refill takes less than 2 seconds.  Neurological:     General: No focal deficit present.     Mental Status: She is alert and oriented to person, place, and time.  Psychiatric:        Mood and Affect: Mood normal.        Behavior: Behavior normal.        Thought Content: Thought content normal.        Judgment: Judgment normal.       Assessment & Plan:  1. Essential hypertension - Labs  done 4 months ago at weight loss center. I do not see a reason to repeat labs today - Well controlled.  - No change in medications.  - losartan (COZAAR) 100 MG tablet; Take 1 tablet (100 mg total) by mouth daily.  Dispense: 90 tablet; Refill: 3  2. Mixed hyperlipidemia - Continue statin   3. Class 1 obesity due to excess calories in adult, unspecified BMI, unspecified whether serious comorbidity present - Continue to work with weight loss clinic   4. Atrial fibrillation, unspecified type (HCC) - Follow up with cardiology as directed  5. 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 Ductal carcinoma in situ (DCIS) of left breast - Per oncology   7. Insomnia, unspecified type  - traZODone (DESYREL) 50 MG tablet; TAKE 1 TABLET BY MOUTH EVERYDAY AT BEDTIME  Dispense: 90 tablet; Refill: 1  8. MCI (mild cognitive impairment) - Follow up with Neurology as directed  9. Dysphagia, unspecified type - Follow up with GI as directed - Soft foods  Shirline Frees, NP

## 2022-06-17 NOTE — Patient Instructions (Signed)
There are no preventive care reminders to display for this patient.    Row Labels 05/25/2022    4:40 PM 04/21/2022    3:23 PM 06/11/2021    8:54 AM  Depression screen PHQ 2/9   Section Header. No data exists in this row.     Decreased Interest   2 0 2  Down, Depressed, Hopeless   2 2 2   PHQ - 2 Score   4 2 4   Altered sleeping   2 0 0  Tired, decreased energy   2 2 3   Change in appetite   2 0 2  Feeling bad or failure about yourself    0 1 1  Trouble concentrating   0 3 0  Moving slowly or fidgety/restless   0 1 1  Suicidal thoughts   0 0 0  PHQ-9 Score   10 9 11   Difficult doing work/chores   Somewhat difficult Somewhat difficult

## 2022-06-19 DIAGNOSIS — H838X3 Other specified diseases of inner ear, bilateral: Secondary | ICD-10-CM | POA: Diagnosis not present

## 2022-06-19 DIAGNOSIS — H6901 Patulous Eustachian tube, right ear: Secondary | ICD-10-CM | POA: Diagnosis not present

## 2022-06-19 DIAGNOSIS — H903 Sensorineural hearing loss, bilateral: Secondary | ICD-10-CM | POA: Diagnosis not present

## 2022-06-23 DIAGNOSIS — L4052 Psoriatic arthritis mutilans: Secondary | ICD-10-CM | POA: Diagnosis not present

## 2022-06-23 DIAGNOSIS — M150 Primary generalized (osteo)arthritis: Secondary | ICD-10-CM | POA: Diagnosis not present

## 2022-06-23 DIAGNOSIS — G894 Chronic pain syndrome: Secondary | ICD-10-CM | POA: Diagnosis not present

## 2022-06-23 DIAGNOSIS — M47812 Spondylosis without myelopathy or radiculopathy, cervical region: Secondary | ICD-10-CM | POA: Diagnosis not present

## 2022-06-25 ENCOUNTER — Other Ambulatory Visit (INDEPENDENT_AMBULATORY_CARE_PROVIDER_SITE_OTHER): Payer: Self-pay | Admitting: Family Medicine

## 2022-06-25 DIAGNOSIS — M5136 Other intervertebral disc degeneration, lumbar region: Secondary | ICD-10-CM | POA: Diagnosis not present

## 2022-06-25 DIAGNOSIS — M8580 Other specified disorders of bone density and structure, unspecified site: Secondary | ICD-10-CM | POA: Diagnosis not present

## 2022-06-25 DIAGNOSIS — M25511 Pain in right shoulder: Secondary | ICD-10-CM | POA: Diagnosis not present

## 2022-06-25 DIAGNOSIS — Z6833 Body mass index (BMI) 33.0-33.9, adult: Secondary | ICD-10-CM | POA: Diagnosis not present

## 2022-06-25 DIAGNOSIS — G5622 Lesion of ulnar nerve, left upper limb: Secondary | ICD-10-CM | POA: Diagnosis not present

## 2022-06-25 DIAGNOSIS — L401 Generalized pustular psoriasis: Secondary | ICD-10-CM | POA: Diagnosis not present

## 2022-06-25 DIAGNOSIS — M1991 Primary osteoarthritis, unspecified site: Secondary | ICD-10-CM | POA: Diagnosis not present

## 2022-06-25 DIAGNOSIS — M7989 Other specified soft tissue disorders: Secondary | ICD-10-CM | POA: Diagnosis not present

## 2022-06-25 DIAGNOSIS — M5030 Other cervical disc degeneration, unspecified cervical region: Secondary | ICD-10-CM | POA: Diagnosis not present

## 2022-06-25 DIAGNOSIS — E669 Obesity, unspecified: Secondary | ICD-10-CM | POA: Diagnosis not present

## 2022-06-25 DIAGNOSIS — E559 Vitamin D deficiency, unspecified: Secondary | ICD-10-CM

## 2022-06-25 DIAGNOSIS — L4050 Arthropathic psoriasis, unspecified: Secondary | ICD-10-CM | POA: Diagnosis not present

## 2022-06-28 NOTE — Progress Notes (Signed)
06/28/2022 Tonya Bartlett [id number] 07-Nov-1945   Chief Complaint: Throat clearing    History of Present Illness: Tonya Bartlett is a 77 year old female with a past medical history of anxiety, depression, hypertension, atrial fibrillation on Eliquis, LLE DVT 1996, breast cancer s/p lumpectomy and radiation on Tamoxifen, mild cognitive impairment, GERD, ischemic verses ischemic colitis in 2017 and colon polyps. She is known by Dr. Adela Lank.  She presents today with complaints of persistent chronic dysphagia with the development of constant throat clearing and a globus sensation which started about 6 weeks ago.  She recalled undergoing an EGD in 2017 and her esophagus was dilated that time and her dysphagia symptoms improved for an undetermined period of time.  She underwent another EGD with esophageal dilatation 10/17/2020 and she stated her dysphagia symptoms did not improve and have occurred daily since then.  She describes swallowing foods such as bread or heavy foods which gets stuck to the upper throat/esophagus at least once daily.  She drinks water and the stuck food moves down the esophagus.  She recalled swallowing pasta a while ago which became stuck and she felt pain all the way down the esophagus as it passed. No further episodes of odynophagia since then.  She sometimes has difficulty swallowing larger vitamins.  No difficulty swallowing liquids.  She has seasonal changes with daily clear postnasal drainage.  She recently saw ENT Dr. Christain Sacramento a few weeks ago for an ear issue and she did not mention her throat symptoms as she stated he does not evaluate throats as he is an ear specialist.  She takes Omeprazole 40 mg once daily.  She takes Pepcid 20 mg once every 6 weeks for breakthrough reflux symptoms.  EGD 09/2015 showed a 4 cm hiatal hernia otherwise esophagus was normal and was empirically dilated.  EGD 10/2020 showed a 3 cm hiatal hernia, the esophagus was normal and was empirically  dilated.  Mild gastritis and small sessile polyps were noted in the gastric fundus/body.  Biopsies identified reactive gastropathy, hyperplastic polyps without evidence of H. pylori.  She underwent a barium swallow 11/06/2021 which showed a small sliding hiatal hernia with mild esophageal dysmotility. She has intentionally lost 25 pounds secondary to dietary changes.  She is passing normal formed brown bowel movement daily.  No rectal bleeding or black stools.  She is on Eliquis for A-fib.  Her most recent colonoscopy was 11/18/2021 which identified 1 tubular adenomatous and 1 hyperplastic polyp removed from the colon.     Latest Ref Rng & Units 03/03/2022    4:26 PM 01/29/2022   10:37 AM 06/11/2021   10:00 AM  CBC  WBC 3.4 - 10.8 x10E3/uL 5.6  4.2  3.7   Hemoglobin 11.1 - 15.9 g/dL 24.4  01.0  27.2   Hematocrit 34.0 - 46.6 % 34.2  37.1  36.3   Platelets 150 - 450 x10E3/uL 267  282  250         Latest Ref Rng & Units 03/03/2022    4:26 PM 01/29/2022   10:37 AM 06/11/2021   10:00 AM  CMP  Glucose 70 - 99 mg/dL 99  98  99   BUN 8 - 27 mg/dL 8  12  11    Creatinine 0.57 - 1.00 mg/dL 5.36  6.44  0.34   Sodium 134 - 144 mmol/L 141  140  141   Potassium 3.5 - 5.2 mmol/L 4.2  3.9  4.4   Chloride 96 - 106  mmol/L 105  102  104   CO2 20 - 29 mmol/L 23  21  25    Calcium 8.7 - 10.3 mg/dL 9.3  9.4  9.2   Total Protein 6.0 - 8.5 g/dL  6.8  6.9   Total Bilirubin 0.0 - 1.2 mg/dL  0.5  0.6   Alkaline Phos 44 - 121 IU/L  46  61   AST 0 - 40 IU/L  21  20   ALT 0 - 32 IU/L  18  17     Procedure history: Colonoscopy 04/15/2016 - 10 small polyps - adenomatous / sessile serrated, diverticulosis, hemorrhoids, no colitis   EGD on 09/26/2015 - 4cm hiatal hernia, otherwise normal esophagus, empirically dilated to 17mm Savory. Mild gastritis noted   Colonoscopy 03/15/18 - The perianal and digital rectal examinations were normal. - A 4 to 5 mm polyp was found in the ascending colon. The polyp was flat. The polyp  was removed with a cold snare. Resection and retrieval were complete. - Two sessile polyps were found in the transverse colon. The polyps were 3 to 4 mm in size. These polyps were removed with a cold snare. Resection and retrieval were complete. - A 3 mm polyp was found in the recto-sigmoid colon. The polyp was sessile. The polyp was removed with a cold snare. Resection and retrieval were complete. - A 3 mm polyp was found in the rectum. The polyp was sessile. The polyp was removed with a cold snare. Resection and retrieval were complete. - Multiple small-mouthed diverticula were found in the sigmoid colon, transverse colon, ascending colon and cecum, with tortousity in the sigmoid colon. - The exam was otherwise without abnormality. Retroflexed views of rectum not obtained due to small size of the rectum.    - TUBULAR ADENOMA (2 FRAGMENTS) - SESSILE SERRATED POLYP (4 FRAGMENTS) - HYPERPLASTIC POLYP (1 FRAGMENTS) - NO HIGH GRADE DYSPLASIA OR MALIGNANCY IDENTIFIED  EGD 10/17/20: - A 3 cm hiatal hernia was present. - The exam of the esophagus was otherwise normal. No focal stenosis / stricture noted. - A guidewire was placed and the scope was withdrawn. Empiric dilation was performed in the entire esophagus with a Savary dilator with mild resistance at 17 mm, relook endoscopy showed no mucosal wrents. - Patchy mildly erythematous mucosa was found in the gastric antrum. Biopsies were taken with a cold forceps for Helicobacter pylori testing. - Multiple small sessile polyps were found in the gastric fundus and in the gastric body. One representative polyp biopsied in the fundus with a cold forceps for histology, appeared to be mostly removed. - The exam of the stomach was otherwise normal. - The duodenal bulb and second portion of the duodenum were normal.   1. Surgical [P], gastric antrum and gastric body - REACTIVE GASTROPATHY - NO H. PYLORI OR INTESTINAL METAPLASIA IDENTIFIED - SEE  COMMENT 2. Surgical [P], gastric polyp x1 - HYPERPLASTIC GASTRIC POLYP(S), INFLAMED - NO H. PYLORI, INTESTINAL METAPLASIA OR MALIGNANCY IDENTIFIED  Colonoscopy 12/08/2021: - One diminutive polyp in the ascending colon, removed with a cold biopsy forceps. Resected and retrieved.  - One 4 mm polyp in the sigmoid colon, removed with a cold snare. Resected and retrieved.  - Diverticulosis in the entire examined colon.  - Internal hemorrhoids.  - Tortuous colon.  - The examination was otherwise normal. - Consider 5 year recall colonoscopy  - SMALL TUBULAR ADENOMA, NEGATIVE FOR HIGH-GRADE DYSPLASIA. - HYPERPLASTIC POLYP.  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  1996   History of hiatal hernia    History of iron deficiency anemia 1996   iron infusion   History of migraine headaches    History of radiation therapy    left breast 03/27/2021-04/23/2021 Dr Antony Blackbird   History of rib fracture    Hyperlipidemia    Hypertension    IBS (irritable bowel syndrome)    Insomnia    Joint pain    MCI (mild cognitive impairment)    Melanoma (HCC) 2019   left leg    Migraine headache    hx of migraines    OA (osteoarthritis)    RIGHT SHOULDER   Osteopenia    Pneumonia    remote history   PONV (postoperative nausea and vomiting)    Recovering alcoholic (HCC)    SINCE 01-21-1980   Right  rotator cuff tear    SOB (shortness of breath) on exertion    Substance abuse (HCC)    recovering alcoholic   Swallowing difficulty    Unspecified essential hypertension    Current Outpatient Medications on File Prior to Visit  Medication Sig Dispense Refill   acetaminophen (TYLENOL) 325 MG tablet Take 650 mg by mouth every 6 (six) hours as needed for moderate pain.     Antiseborrheic Products, Misc. (PROMISEB) CREA Uses topical daily as needed on her face     Ascorbic Acid (VITAMIN C) 1000 MG tablet Take 1,000 mg by mouth daily.     atorvastatin (LIPITOR) 40 MG tablet Take 1 tablet (40 mg total) by mouth daily. 90 tablet 3   betamethasone dipropionate 0.05 % lotion Apply topically 2 (two) times daily. 60 mL 0   betamethasone, augmented, (DIPROLENE) 0.05 % lotion Apply topically 2 (two) times daily. 60 mL 0   ELIQUIS 5 MG TABS tablet TAKE 1 TABLET BY MOUTH TWICE A DAY 180 tablet 1   famotidine (PEPCID) 20 MG tablet Take 1 tablet (20 mg total) by mouth daily as needed for heartburn or indigestion. (Patient taking differently: Take 20 mg by mouth daily.) 90 tablet 3   FIBER PO Take 1 capsule by mouth daily. Pt takes in the evening.     HYDROcodone-acetaminophen (NORCO/VICODIN) 5-325 MG tablet 1 tablet as needed     hydrocortisone cream 1 % Apply 1 application topically daily as needed for itching.     L-Methylfolate-B12-B6-B2 (CEREFOLIN) 07-17-48-5 MG TABS Take 1 tablet by mouth daily. 90 tablet 1   losartan (COZAAR) 100 MG tablet Take 1 tablet (100 mg total) by mouth daily. 90 tablet 3   metFORMIN (GLUCOPHAGE) 500 MG tablet Take 1 tablet (500 mg total) by mouth 2 (two) times daily with a  meal. 60 tablet 0   metoprolol tartrate (LOPRESSOR) 25 MG tablet TAKE 0.5 TABLETS BY MOUTH 2 TIMES DAILY. 90 tablet 1   omeprazole (PRILOSEC) 40 MG capsule TAKE 1 CAPSULE (40 MG TOTAL) BY MOUTH DAILY. 90 capsule 2   rizatriptan (MAXALT) 10 MG tablet Take 1 tablet (10 mg total) by mouth as needed for migraine.  May repeat in 2 hours if needed 10 tablet 2   sulfamethoxazole-trimethoprim (BACTRIM,SEPTRA) 400-80 MG tablet Take 1 tablet by mouth daily. For urethritis.     tamoxifen (NOLVADEX) 10 MG tablet Take 1/2 tablet by mouth in the evening     traZODone (DESYREL) 50 MG tablet TAKE 1 TABLET BY MOUTH EVERYDAY AT BEDTIME 90 tablet 1   venlafaxine XR (EFFEXOR XR) 75 MG 24 hr capsule Take 1 capsule (75 mg total) by mouth daily. 30 capsule 0   Vitamin D, Ergocalciferol, (DRISDOL) 1.25 MG (50000 UNIT) CAPS capsule Take 1 capsule (50,000 Units total) by mouth every 7 (seven) days. 4 capsule 0   No current facility-administered medications on file prior to visit.   Allergies  Allergen Reactions   Nsaids Other (See Comments) and Anaphylaxis    SEVERE STOMACH CRAMPS, MOUTH SORES  **Able to tolerate Tylenol   Otezla [Apremilast] Anaphylaxis    Suicidal ideation   Penicillins Anaphylaxis and Other (See Comments)    Has patient had a PCN reaction causing immediate rash, facial/tongue/throat swelling, SOB or lightheadedness with hypotension:  Yes  Has patient had a PCN reaction causing severe rash involving mucus membranes or skin necrosis: Yes  Has patient had a PCN reaction that required hospitalization: No  Has patient had a PCN reaction occurring within the last 10 year No  If all of the above answers are "NO", then may proceed with Cephalosporin use.   Erythromycin Other (See Comments)    SEVERE STOMACH CRAMPS   Morphine And Related Nausea And Vomiting   Penicillamine Other (See Comments)   Remicade [Infliximab] Other (See Comments)    Shut down immune system-BP high   Risankizumab Other (See Comments)    URI, fever, cough, UTI   Tolmetin     SEVERE STOMACH CRAMPS   Nickel Rash    Including snaps on hospital gowns    Current Medications, Allergies, Past Medical History, Past Surgical History, Family History and Social History were reviewed in [employer]  record.  Review of Systems:   Constitutional: Negative for fever, sweats, chills or weight loss.  Respiratory: Negative for shortness of breath.   Cardiovascular: Negative for chest pain, palpitations and leg swelling.  Gastrointestinal: See HPI.  Musculoskeletal: Negative for back pain or muscle aches.  Neurological: Negative for dizziness, headaches or paresthesias.   Physical Exam: BP 130/88   Pulse 63   Ht 5\' 5"  (1.651 m)   Wt 210 lb (95.3 kg)   BMI 34.95 kg/m   General: 77 year old female in no acute distress. Head: Normocephalic and atraumatic. Eyes: No scleral icterus. Conjunctiva pink . Ears: Normal auditory acuity. Mouth: Dentition intact. No ulcers or lesions.  Lungs: Clear throughout to auscultation. Heart: Regular rate and rhythm, no murmur. Abdomen: Soft, nontender and nondistended. No masses or hepatomegaly. Normal bowel sounds x 4 quadrants.  Rectal: Deferred Musculoskeletal: Symmetrical with no gross deformities. Extremities: No edema. Neurological: Alert oriented x 4. No focal deficits.  Psychological: Alert and cooperative. Normal mood and affect  Assessment and Recommendations:  77 year old female with a history of GERD and chronic dysphagia who recently  developed frequent throat clearing with associated globus sensation. Her dysphagia symptoms improved following esophageal dilation in 2017, however, she had recurrent dysphagia s/p EGD with esophageal dilatation 10/2020 without noticeable improvement in dysphagia symptoms. Barium swallow study 10/2021 showed mild esophageal dysmotility.  Frequent throat clearing with globus sensation symptoms started 6 weeks ago.  -Increase Omeprazole 40mg  once daily to bid -ENT referral to include laryngoscopy  -I will consult with Dr. Adela Lank to verify if a repeat EGD with esophageal dilatation warranted vs esophageal manometry.  -Patient instructed to avoid eating large pieces of bread, meat or rice.  Cut food into small  pieces and chew food thoroughly.  History of colon polyps.  Her most recent colonoscopy 11/18/2021 identified 1 tubular adenomatous and 1 hyperplastic polyp removed from the colon. -Colonoscopy 11/2026 to be considered if medically appropriate as she will be 77 years old at that time  Atrial fibrillation on Eliquis

## 2022-06-29 ENCOUNTER — Ambulatory Visit (INDEPENDENT_AMBULATORY_CARE_PROVIDER_SITE_OTHER): Payer: Medicare Other | Admitting: Nurse Practitioner

## 2022-06-29 ENCOUNTER — Encounter: Payer: Self-pay | Admitting: Nurse Practitioner

## 2022-06-29 VITALS — BP 130/88 | HR 63 | Ht 65.0 in | Wt 210.0 lb

## 2022-06-29 DIAGNOSIS — K219 Gastro-esophageal reflux disease without esophagitis: Secondary | ICD-10-CM

## 2022-06-29 DIAGNOSIS — R1310 Dysphagia, unspecified: Secondary | ICD-10-CM | POA: Diagnosis not present

## 2022-06-29 DIAGNOSIS — R09A2 Foreign body sensation, throat: Secondary | ICD-10-CM | POA: Diagnosis not present

## 2022-06-29 MED ORDER — OMEPRAZOLE 40 MG PO CPDR: 40.0000 mg | DELAYED_RELEASE_CAPSULE | Freq: Every day | ORAL | 2 refills | Status: DC

## 2022-06-29 MED ORDER — OMEPRAZOLE 40 MG PO CPDR: 40.0000 mg | DELAYED_RELEASE_CAPSULE | Freq: Two times a day (BID) | ORAL | 2 refills | Status: DC

## 2022-06-29 NOTE — Patient Instructions (Signed)
We have sent the following medications to your pharmacy for you to pick up at your convenience: Omeprazole 40 mg  We have sent over a referral to ENT.  Due to recent changes in healthcare laws, you may see the results of your imaging and laboratory studies on MyChart before your provider has had a chance to review them.  We understand that in some cases there may be results that are confusing or concerning to you. Not all laboratory results come back in the same time frame and the provider may be waiting for multiple results in order to interpret others.  Please give [name] 48 hours in order for your provider to thoroughly review all the results before contacting the office for clarification of your results.   Thank you for trusting me with your gastrointestinal care!   Alcide Evener, CRNP

## 2022-06-29 NOTE — Progress Notes (Signed)
Agree with assessment and plan as outlined. Sounds like the dilation helped her remotely but the last time not as much.  She does have mild esophageal dysmotility on last barium study in September.  If she wants to pursue another EGD with dilation, we could do that but she would have to hold the Eliquis for that procedure.  If she feels that has helped her in the past you can coordinate it.  If she felt no benefit at all, then I question how much her repeat dilation would help her.  The problem is we do not have much other options to help her swallowing.  We could consider esophageal manometry, we are booking out until September for that, but I think it is unlikely going to make any significant changes in her management.  I suspect it will not show anything concerning like achalasia etc.  If she is really having a hard time swallowing I think repeat EGD may be reasonable in her best option to find relief as well.

## 2022-06-30 ENCOUNTER — Ambulatory Visit: Payer: Medicare Other | Attending: Cardiology | Admitting: Cardiology

## 2022-06-30 ENCOUNTER — Encounter: Payer: Self-pay | Admitting: Cardiology

## 2022-06-30 VITALS — BP 126/86 | HR 68 | Ht 65.0 in | Wt 213.0 lb

## 2022-06-30 DIAGNOSIS — I10 Essential (primary) hypertension: Secondary | ICD-10-CM | POA: Diagnosis not present

## 2022-06-30 DIAGNOSIS — I480 Paroxysmal atrial fibrillation: Secondary | ICD-10-CM | POA: Insufficient documentation

## 2022-06-30 NOTE — Progress Notes (Deleted)
Electrophysiology Office Follow up Visit Note:    Date:  06/30/2022   ID:  Tonya Bartlett, DOB 20-Feb-1945, MRN [id number]  PCP:  Shirline Frees, NP  [facility] Cardiologist:  Nanetta Batty, MD  [facility] Electrophysiologist:  Lanier Prude, MD    Interval History:    Tonya Bartlett is a 77 y.o. female who presents for a follow up visit.   Last seen April 21, 2022 for paroxysmal atrial fibrillation.  At the last appointment we discussed catheter ablation and left atrial appendage occlusion. She currently takes Eliquis 5 mg by mouth twice daily for stroke prophylaxis.      Past medical, surgical, social and family history were reviewed.  ROS:   Please see the history of present illness.    All other systems reviewed and are negative.  EKGs/Labs/Other Studies Reviewed:    The following studies were reviewed today:   Physical Exam:    VS:  There were no vitals taken for this visit.    Wt Readings from Last 3 Encounters:  06/29/22 210 lb (95.3 kg)  06/17/22 208 lb (94.3 kg)  06/04/22 203 lb (92.1 kg)     GEN: *** Well nourished, well developed in no acute distress CARDIAC: ***RRR, no murmurs, rubs, gallops RESPIRATORY:  Clear to auscultation without rales, wheezing or rhonchi       ASSESSMENT:    No diagnosis found. PLAN:    In order of problems listed above:  #pAF Increasing burden. Symptomatic.  On Eliquis but wishes to avoid long term exposure given increased risk of bleeding with diverticulosis/colitis.    Discussed treatment options today for their AF including antiarrhythmic drug therapy and ablation. Discussed risks, recovery and likelihood of success. Discussed potential need for repeat ablation procedures and antiarrhythmic drugs after an initial ablation. They wish to proceed with scheduling.   Risk, benefits, and alternatives to EP study and radiofrequency ablation for afib were also discussed in detail today. These risks include but  are not limited to stroke, bleeding, vascular damage, tamponade, perforation, damage to the esophagus, lungs, and other structures, pulmonary vein stenosis, worsening renal function, and death. The patient understands these risk and wishes to proceed.  We will therefore proceed with catheter ablation at the next available time.  Carto, ICE, anesthesia are requested for the procedure.  Will also obtain CT PV protocol prior to the procedure to exclude LAA thrombus and further evaluate atrial anatomy.   ------------   I have seen Tonya Bartlett in the office today who is being considered for a Watchman left atrial appendage closure device. I believe they will benefit from this procedure given their history of atrial fibrillation, CHA2DS2-VASc score of 5 and unadjusted ischemic stroke rate of 7.5% per year. Unfortunately, the patient is not felt to be a long term anticoagulation candidate secondary to increased risk of bleeding due to history of diverticulosis/colitis. The patient's chart has been reviewed and I feel that they would be a candidate for short term oral anticoagulation after Watchman implant.    It is my belief that after undergoing a LAA closure procedure, Tonya Bartlett will not need long term anticoagulation which eliminates anticoagulation side effects and major bleeding risk.    Procedural risks for the Watchman implant have been reviewed with the patient including a 0.5% risk of stroke, <1% risk of perforation and <1% risk of device embolization. Other risks include bleeding, vascular damage, tamponade, worsening renal function, and death. The patient understands these risk  and wishes to proceed.       The published clinical data on the safety and effectiveness of WATCHMAN include but are not limited to the following: - Holmes DR, Everlene Farrier, Sick P et al. for the PROTECT AF Investigators. Percutaneous closure of the left atrial appendage versus warfarin therapy for prevention of stroke  in patients with atrial fibrillation: a randomised non-inferiority trial. Lancet 2009; 374: 534-42. Everlene Farrier, Doshi SK, Isa Rankin D et al. on behalf of the PROTECT AF Investigators. Percutaneous Left Atrial Appendage Closure for Stroke Prophylaxis in Patients With Atrial Fibrillation 2.3-Year Follow-up of the PROTECT AF (Watchman Left Atrial Appendage System for Embolic Protection in Patients With Atrial Fibrillation) Trial. Circulation 2013; 127:720-729. - Alli O, Doshi S,  Kar S, Reddy VY, Sievert H et al. Quality of Life Assessment in the Randomized PROTECT AF (Percutaneous Closure of the Left Atrial Appendage Versus Warfarin Therapy for Prevention of Stroke in Patients With Atrial Fibrillation) Trial of Patients at Risk for Stroke With Nonvalvular Atrial Fibrillation. J Am Coll Cardiol 2013; 61:1790-8. Aline August DR, Mia [place], Price M, Whisenant B, Sievert H, Doshi S, Huber K, Reddy V. Prospective randomized evaluation of the Watchman left atrial appendage Device in patients with atrial fibrillation versus long-term warfarin therapy; the PREVAIL trial. Journal of the [employer] of Cardiology, Vol. 4, No. 1, 2014, 1-11. - Kar S, Doshi SK, Sadhu A, Horton R, Osorio J et al. Primary outcome evaluation of a next-generation left atrial appendage closure device: results from the PINNACLE FLX trial. Circulation 2021;143(18)1754-1762.      After today's visit with the patient which was dedicated solely for shared decision making visit regarding LAA closure device, the patient decided to proceed with the LAA appendage closure procedure scheduled to be done in the near future at [facility]. Prior to the procedure, I would like to obtain a gated CT scan of the chest with contrast timed for PV/LA visualization.    HAS-BLED score 2 Hypertension Yes  Abnormal renal and liver function (Dialysis, transplant, Cr >2.26 mg/dL /Cirrhosis or Bilirubin >2x Normal or AST/ALT/AP >3x Normal) No  Stroke  No  Bleeding No  Labile INR (Unstable/high INR) No  Elderly (>65) Yes  Drugs or alcohol (? 8 drinks/week, anti-plt or NSAID) No    CHA2DS2-VASc Score = 5  The patient's score is based upon: CHF History: 0 HTN History: 1 Diabetes History: 0 Stroke History: 0 Vascular Disease History: 1 Age Score: 2 Gender Score: 1      #Hypertension *** goal today.  Recommend checking blood pressures 1-2 times per week at home and recording the values.  Recommend bringing these recordings to the primary care physician.       Signed, Steffanie Dunn, MD, [facility], [facility] 06/30/2022 5:02 AM    Electrophysiology [facility]

## 2022-06-30 NOTE — Patient Instructions (Addendum)
Medication Instructions:  Your physician recommends that you continue on your current medications as directed. Please refer to the Current Medication list given to you today.  *If you need a refill on your cardiac medications before your next appointment, please call your pharmacy*  Lab Work: BMET and CBC prior to CT scan and ablation (6/17 between 7:30am and 4:30pm)  If you have labs (blood work) drawn today and your tests are completely normal, you will receive your results only by: MyChart Message (if you have MyChart) OR A paper copy in the mail If you have any lab test that is abnormal or we need to change your treatment, we will call you to review the results.  Testing/Procedures: Your physician has requested that you have cardiac CT. Cardiac computed tomography (CT) is a painless test that uses an x-ray machine to take clear, detailed pictures of your heart. For further information please visit [email or web address] Please follow instruction sheet as given.  Your physician has recommended that you have an ablation. Catheter ablation is a medical procedure used to treat some cardiac arrhythmias (irregular heartbeats). During catheter ablation, a long, thin, flexible tube is put into a blood vessel in your groin (upper thigh), or neck. This tube is called an ablation catheter. It is then guided to your heart through the blood vessel. Radio frequency waves destroy small areas of heart tissue where abnormal heartbeats may cause an arrhythmia to start. Please see the instruction sheet given to you today.  Follow-Up: At [facility], you and your health needs are our priority.  As part of our continuing mission to provide you with exceptional heart care, we have created designated Provider Care Teams.  These Care Teams include your primary Cardiologist (physician) and Advanced Practice Providers (APPs -  Physician Assistants and Nurse Practitioners) who all work together to provide you  with the care you need, when you need it.   Your next appointment:   We will call you to arrange follow up

## 2022-06-30 NOTE — Progress Notes (Signed)
Electrophysiology Office Follow up Visit Note:    Date:  06/30/2022   ID:  Tonya Bartlett, DOB Oct 01, 1945, MRN [id number]  PCP:  Shirline Frees, NP  [facility] Cardiologist:  Nanetta Batty, MD  [facility] Electrophysiologist:  Lanier Prude, MD    Interval History:    Tonya Bartlett is a 77 y.o. female who presents for a consult for ablation. New onset atrial fibrillation was diagnosed 01/2021. Since that time she had been maintaining sinus rhythm until recently. She saw Dr. Bjorn Pippin early 02/2022 and her metoprolol was increased.   She last saw Rudi Coco, NP 03/17/2022. She reported improvement in her Afib episodes since her metoprolol was increased. She wished to discuss pursuing ablation, so she was referred to EP. She was also interested in [place] as her Eliquis is expensive. She currently takes Eliquis 5 mg by mouth twice daily for stroke prophylaxis.  Today, she reports that most of the time she is able to feel when she is in atrial fibrillation. Generally she will feel associated palpitations and starts feeling hot. Lately she is feeling much better with the increased dose of metoprolol. However, she does still have some breakthrough episodes of atrial fibrillation.   She endorses shortness of breath after walking too much or too long. At times this may be the distance from walking from her car in the parking lot to the building.  She confirms a personal history of left-sided breast cancer s/p radiation therapy in 2023.  She denies any chest pain, or peripheral edema. No lightheadedness, headaches, syncope, orthopnea, or PND.     Past medical, surgical, social and family history were reviewed.  ROS:   Please see the history of present illness.    (+) Palpitations (+) Shortness of breath All other systems reviewed and are negative.  EKGs/Labs/Other Studies Reviewed:    The following studies were reviewed today:   Physical Exam:    VS:  BP 126/86    Pulse 68   Ht 5\' 5"  (1.651 m)   Wt 213 lb (96.6 kg)   SpO2 98%   BMI 35.45 kg/m     Wt Readings from Last 3 Encounters:  06/30/22 213 lb (96.6 kg)  06/29/22 210 lb (95.3 kg)  06/17/22 208 lb (94.3 kg)     GEN:  Well nourished, well developed in no acute distress CARDIAC: RRR, no murmurs, rubs, gallops RESPIRATORY:  Clear to auscultation without rales, wheezing or rhonchi       ASSESSMENT:    1. PAF (paroxysmal atrial fibrillation) (HCC)   2. Essential hypertension, benign    PLAN:    In order of problems listed above:  #pAF Increasing burden. Symptomatic.  On Eliquis but wishes to avoid long term exposure given increased risk of bleeding with diverticulosis/colitis.    Discussed treatment options today for their AF including antiarrhythmic drug therapy and ablation. Discussed risks, recovery and likelihood of success. Discussed potential need for repeat ablation procedures and antiarrhythmic drugs after an initial ablation. They wish to proceed with scheduling.   Risk, benefits, and alternatives to EP study and radiofrequency ablation for afib were also discussed in detail today. These risks include but are not limited to stroke, bleeding, vascular damage, tamponade, perforation, damage to the esophagus, lungs, and other structures, pulmonary vein stenosis, worsening renal function, and death. The patient understands these risk and wishes to proceed.  We will therefore proceed with catheter ablation at the next available time.  Carto, ICE, anesthesia are requested  for the procedure.  Will also obtain CT PV protocol prior to the procedure to exclude LAA thrombus and further evaluate atrial anatomy.   ------------   I have seen Tonya Bartlett in the office today who is being considered for a Watchman left atrial appendage closure device. I believe they will benefit from this procedure given their history of atrial fibrillation, CHA2DS2-VASc score of 5 and unadjusted ischemic  stroke rate of 7.5% per year. Unfortunately, the patient is not felt to be a long term anticoagulation candidate secondary to increased risk of bleeding due to history of diverticulosis/colitis. The patient's chart has been reviewed and I feel that they would be a candidate for short term oral anticoagulation after Watchman implant.    It is my belief that after undergoing a LAA closure procedure, Tonya Bartlett will not need long term anticoagulation which eliminates anticoagulation side effects and major bleeding risk.    Procedural risks for the Watchman implant have been reviewed with the patient including a 0.5% risk of stroke, <1% risk of perforation and <1% risk of device embolization. Other risks include bleeding, vascular damage, tamponade, worsening renal function, and death. The patient understands these risk and wishes to proceed.       The published clinical data on the safety and effectiveness of WATCHMAN include but are not limited to the following: - Holmes DR, Everlene Farrier, Sick P et al. for the PROTECT AF Investigators. Percutaneous closure of the left atrial appendage versus warfarin therapy for prevention of stroke in patients with atrial fibrillation: a randomised non-inferiority trial. Lancet 2009; 374: 534-42. Everlene Farrier, Doshi SK, Isa Rankin D et al. on behalf of the PROTECT AF Investigators. Percutaneous Left Atrial Appendage Closure for Stroke Prophylaxis in Patients With Atrial Fibrillation 2.3-Year Follow-up of the PROTECT AF (Watchman Left Atrial Appendage System for Embolic Protection in Patients With Atrial Fibrillation) Trial. Circulation 2013; 127:720-729. - Alli O, Doshi S,  Kar S, Reddy VY, Sievert H et al. Quality of Life Assessment in the Randomized PROTECT AF (Percutaneous Closure of the Left Atrial Appendage Versus Warfarin Therapy for Prevention of Stroke in Patients With Atrial Fibrillation) Trial of Patients at Risk for Stroke With Nonvalvular Atrial  Fibrillation. J Am Coll Cardiol 2013; 61:1790-8. Aline August DR, Mia [place], Price M, Whisenant B, Sievert H, Doshi S, Huber K, Reddy V. Prospective randomized evaluation of the Watchman left atrial appendage Device in patients with atrial fibrillation versus long-term warfarin therapy; the PREVAIL trial. Journal of the [employer] of Cardiology, Vol. 4, No. 1, 2014, 1-11. - Kar S, Doshi SK, Sadhu A, Horton R, Osorio J et al. Primary outcome evaluation of a next-generation left atrial appendage closure device: results from the PINNACLE FLX trial. Circulation 2021;143(18)1754-1762.      After today's visit with the patient which was dedicated solely for shared decision making visit regarding LAA closure device, the patient decided to proceed with the LAA appendage closure procedure scheduled to be done in the near future at [facility]. Prior to the procedure, I would like to obtain a gated CT scan of the chest with contrast timed for PV/LA visualization.    HAS-BLED score 2 Hypertension Yes  Abnormal renal and liver function (Dialysis, transplant, Cr >2.26 mg/dL /Cirrhosis or Bilirubin >2x Normal or AST/ALT/AP >3x Normal) No  Stroke No  Bleeding No  Labile INR (Unstable/high INR) No  Elderly (>65) Yes  Drugs or alcohol (? 8 drinks/week, anti-plt or NSAID) No  CHA2DS2-VASc Score = 5  The patient's score is based upon: CHF History: 0 HTN History: 1 Diabetes History: 0 Stroke History: 0 Vascular Disease History: 1 Age Score: 2 Gender Score: 1      #Hypertension At goal today.  Recommend checking blood pressures 1-2 times per week at home and recording the values.  Recommend bringing these recordings to the primary care physician.   I,Mathew Stumpf,acting as a Neurosurgeon for Lanier Prude, MD.,have documented all relevant documentation on the behalf of Lanier Prude, MD,as directed by  Lanier Prude, MD while in the presence of Lanier Prude, MD.  I, Lanier Prude, MD, have reviewed all documentation for this visit. The documentation on 06/30/22 for the exam, diagnosis, procedures, and orders are all accurate and complete.   Signed, Steffanie Dunn, MD, [facility], [facility] 06/30/2022 3:08 PM    Electrophysiology [facility]

## 2022-07-03 ENCOUNTER — Other Ambulatory Visit: Payer: Self-pay | Admitting: Cardiovascular Disease

## 2022-07-03 DIAGNOSIS — I480 Paroxysmal atrial fibrillation: Secondary | ICD-10-CM

## 2022-07-03 NOTE — Telephone Encounter (Signed)
Prescription refill request for Eliquis received. Indication:afib Last office visit:5/24 Scr:0.6 Age: 77 Weight:96.6  kg  Prescription refilled

## 2022-07-04 ENCOUNTER — Other Ambulatory Visit (HOSPITAL_COMMUNITY): Payer: Self-pay | Admitting: Psychiatry

## 2022-07-04 DIAGNOSIS — F331 Major depressive disorder, recurrent, moderate: Secondary | ICD-10-CM

## 2022-07-07 ENCOUNTER — Telehealth: Payer: Self-pay | Admitting: Cardiology

## 2022-07-07 NOTE — Telephone Encounter (Signed)
Received message from Dr. Lalla Brothers regarding nickel allergy. Unfortunately the patient is no longer a Watchman candidate given this however can still proceed with AF ablation scheduled for 08/17/22 with Dr. Lalla Brothers if she wishes to proceed. I have called to speak with her about this with no answer. Left voice message to return my call. Will attempt again tomorrow.   Georgie Chard NP-C Structural Heart Team  Pager: [phone] Phone: [phone]

## 2022-07-08 NOTE — Telephone Encounter (Signed)
Informed the patient that while she may still proceed with ablation, unfortunately she cannot have the Watchman implant due to her nickel allergy. She agreed, stating that she had an IUD that included nickel and she almost "bled out." She understands if she has further questions, Dr. Lalla Brothers will discuss day of ablation. She was grateful for call and agreed with plan.

## 2022-07-09 ENCOUNTER — Ambulatory Visit (INDEPENDENT_AMBULATORY_CARE_PROVIDER_SITE_OTHER): Payer: Medicare Other | Admitting: Family Medicine

## 2022-07-10 NOTE — Progress Notes (Signed)
[name] or Tonya Bartlett, pls contact patient and let her know Dr. Lanetta Inch addendum recommendations. Schedule her for an EGD with Dr. Adela Lank if she wishes to pursue due to her ongoing swallowing issues. If she wishes to proceed with an EGD, pls contact her cardiologist for Eliquis hold instructions. Please let me and Dr. Adela Lank know if EGD scheduled. If she does not schedule an EGD, please schedule her for a follow up appointment with Dr. Adela Lank in 3 to 4 months, earlier if symptoms worsen. THX.

## 2022-07-16 ENCOUNTER — Other Ambulatory Visit: Payer: Self-pay

## 2022-07-16 ENCOUNTER — Telehealth: Payer: Self-pay

## 2022-07-16 DIAGNOSIS — K219 Gastro-esophageal reflux disease without esophagitis: Secondary | ICD-10-CM

## 2022-07-16 DIAGNOSIS — R1310 Dysphagia, unspecified: Secondary | ICD-10-CM

## 2022-07-16 NOTE — Telephone Encounter (Signed)
Spoke with patient regarding NP & MD recommendations. She would like to proceed with EGD. She's been scheduled for 08/11/22 at 10:00 am in the [facility] with Dr. Adela Lank. Amb ref placed & instructions sent to mychart and mailed home. Clearance note sent to [facility] for Eliquis clearance. Patient is aware she will be contacted once clearance received. Patient had no further questions.

## 2022-07-16 NOTE — Telephone Encounter (Signed)
Author: Arnaldo Natal, NP Service: Gastroenterology Author Type: Nurse Practitioner  Filed: 07/10/2022  7:54 AM Encounter Date: 06/29/2022 Status: Signed  Editor: Arnaldo Natal, NP (Nurse Practitioner)   [name] or Viviann Spare, pls contact patient and let her know Dr. Lanetta Inch addendum recommendations. Schedule her for an EGD with Dr. Adela Lank if she wishes to pursue due to her ongoing swallowing issues. If she wishes to proceed with an EGD, pls contact her cardiologist for Eliquis hold instructions. Please let me and Dr. Adela Lank know if EGD scheduled. If she does not schedule an EGD, please schedule her for a follow up appointment with Dr. Adela Lank in 3 to 4 months, earlier if symptoms worsen. THX.

## 2022-07-16 NOTE — Telephone Encounter (Signed)
[facility] Pre-operative Risk Assessment     Request for surgical clearance:     Endoscopy Procedure  What type of surgery is being performed?     EGD  When is this surgery scheduled?     08/11/22  What type of clearance is required ?   Pharmacy  Are there any medications that need to be held prior to surgery and how long? Eliquis   Practice name and name of physician performing surgery?      [facility]  What is your office phone and fax number?      Phone- [phone]  Fax- [phone]  Anesthesia type (None, local, MAC, general) ?       MAC

## 2022-07-20 NOTE — Telephone Encounter (Signed)
Patient with diagnosis of afib on Eliquis for anticoagulation.    Procedure: EGD Date of procedure: 08/11/22  CHA2DS2-VASc Score = 5  This indicates a 7.2% annual risk of stroke. The patient's score is based upon: CHF History: 0 HTN History: 1 Diabetes History: 0 Stroke History: 0 Vascular Disease History: 1 Age Score: 2 Gender Score: 1   CrCl 23mL/min using adj body weight Platelet count 267K  Pt has upcoming afib ablation scheduled on 08/17/22, cannot hold anticoag for 3 weeks prior or 3 months after procedure. EGD would need to be done this week (and can hold Eliquis for 1-2 days prior in that case), otherwise will need to be postponed until October when pt is able to temporarily hold anticoag again.  **This guidance is not considered finalized until pre-operative APP has relayed final recommendations.**

## 2022-07-20 NOTE — Telephone Encounter (Signed)
Inbound call from patient stating some  medications cannot be held prior to procedure. Please advise.

## 2022-07-20 NOTE — Telephone Encounter (Signed)
Pre-op team,   Will you please reach out to requesting physician regarding scheduling for EGD. Patient is scheduled for afib ablation on 08/17/2022. Per pharm D:  "Pt has upcoming afib ablation scheduled on 08/17/22, cannot hold anticoag for 3 weeks prior or 3 months after procedure. EGD would need to be done this week (and can hold Eliquis for 1-2 days prior in that case), otherwise will need to be postponed until October when pt is able to temporarily hold anticoag again."  Thank you!  DW

## 2022-07-20 NOTE — Telephone Encounter (Signed)
Spoke with Tonya Bartlett from requesting office and made her aware of recommendations. She stated that she would speak with the nurse and someone would give [name] a call back

## 2022-07-21 ENCOUNTER — Telehealth: Payer: Self-pay | Admitting: Nurse Practitioner

## 2022-07-21 NOTE — Telephone Encounter (Signed)
Spoke with patient & rescheduled EGD for 07/24/22 at 11:30 with Dr. Paulo Fruit. Advised her to hold Eliquis starting two days prior (per cardiology, refer to alternate phone note 07/16/22). Updated instructions have been sent to mychart. Pt had no further questions. Pre-cert team notified of new date.

## 2022-07-21 NOTE — Telephone Encounter (Signed)
Patient called states she has not heard anything about the referral for ENT.

## 2022-07-21 NOTE — Telephone Encounter (Signed)
Patient's EGD has been rescheduled for 07/24/22 at 11:30 am with Dr. Adela Lank. Discussed cardiology recommendations & advised her to hold Eliquis starting two days prior.

## 2022-07-21 NOTE — Telephone Encounter (Signed)
Kaira, Please advise.

## 2022-07-21 NOTE — Telephone Encounter (Signed)
   Name: Tonya Bartlett  DOB: 1945/02/20  MRN: [id number]   Primary Cardiologist: Nanetta Batty, MD  Chart reviewed as part of pre-operative protocol coverage. [name] was last seen on 06/30/2022 by Dr. Lalla Brothers.  She she is pending A-fib ablation 08/17/2022.  Spoke with patient today and she continues to do well. Patient denies shortness of breath. No chest pain, pressure, or tightness. Denies lower extremity edema, orthopnea, or PND.  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  She reports mild dyspnea with longer distances/heavier exertion but none with routine activities.  Therefore, based on ACC/AHA guidelines, the patient would be at acceptable risk for the planned procedure without further cardiovascular testing.   Per Pharm.D.: Patient with diagnosis of afib on Eliquis for anticoagulation.     Procedure: EGD Date of procedure: 08/11/22   CHA2DS2-VASc Score = 5  This indicates a 7.2% annual risk of stroke. The patient's score is based upon: CHF History: 0 HTN History: 1 Diabetes History: 0 Stroke History: 0 Vascular Disease History: 1 Age Score: 2 Gender Score: 1   CrCl 50mL/min using adj body weight Platelet count 267K   Pt has upcoming afib ablation scheduled on 08/17/22, cannot hold anticoag for 3 weeks prior or 3 months after procedure. EGD would need to be done this week (and can hold Eliquis for 1-2 days prior in that case), otherwise will need to be postponed until October when pt is able to temporarily hold anticoag again.    I will route this recommendation to the requesting party via Epic fax function and remove from pre-op pool. Please call with questions.  Carlos Levering, NP 07/21/2022, 2:48 PM

## 2022-07-21 NOTE — Telephone Encounter (Signed)
Called [facility] & they provided me with appointment date for patient 09/04/22 at 2:15 pm with Dr. Aleene Davidson. Pt has been made aware & has been provided address and phone number for their office.

## 2022-07-22 ENCOUNTER — Ambulatory Visit (INDEPENDENT_AMBULATORY_CARE_PROVIDER_SITE_OTHER): Payer: Medicare Other | Admitting: Family Medicine

## 2022-07-22 ENCOUNTER — Encounter (INDEPENDENT_AMBULATORY_CARE_PROVIDER_SITE_OTHER): Payer: Self-pay | Admitting: Family Medicine

## 2022-07-22 VITALS — BP 152/85 | HR 95 | Temp 97.7°F | Ht 66.0 in | Wt 205.0 lb

## 2022-07-22 DIAGNOSIS — E559 Vitamin D deficiency, unspecified: Secondary | ICD-10-CM

## 2022-07-22 DIAGNOSIS — R7303 Prediabetes: Secondary | ICD-10-CM | POA: Diagnosis not present

## 2022-07-22 DIAGNOSIS — Z6833 Body mass index (BMI) 33.0-33.9, adult: Secondary | ICD-10-CM | POA: Diagnosis not present

## 2022-07-22 DIAGNOSIS — E669 Obesity, unspecified: Secondary | ICD-10-CM | POA: Diagnosis not present

## 2022-07-22 MED ORDER — METFORMIN HCL 500 MG PO TABS: 500.0000 mg | ORAL_TABLET | Freq: Two times a day (BID) | ORAL | 0 refills | Status: DC

## 2022-07-22 MED ORDER — VITAMIN D (ERGOCALCIFEROL) 1.25 MG (50000 UNIT) PO CAPS
50000.0000 [IU] | ORAL_CAPSULE | ORAL | 0 refills | Status: DC
Start: 2022-07-22 — End: 2023-01-20

## 2022-07-22 NOTE — Progress Notes (Unsigned)
Chief Complaint:   OBESITY Tonya Bartlett is here to discuss her progress with her obesity treatment plan along with follow-up of her obesity related diagnoses. Tonya Bartlett is on keeping a food journal and adhering to recommended goals of 1200 calories and 75+ grams of protein and states she is following her eating plan approximately 20% of the time. Tonya Bartlett states she is walking for 20-30 minutes 3 times per week.  Today's visit was #: 16 Starting weight: 222 lbs Starting date: 06/11/2021 Today's weight: 205 lbs Today's date: 07/22/2022 Total lbs lost to date: 17 Total lbs lost since last in-office visit: 0  Interim History: Patient has been off track with her eating plan.  She is ready to get back on track with her weight loss efforts.  She is open to discussing other meal plans.  Subjective:   1. Vitamin D deficiency Patient is on vitamin D, and her last level was drawn 1 year ago.  She has no signs of over replacement.  2. Pre-diabetes Patient is on metformin but she is struggling more with her diet.  Her last A1c was well-controlled at 5.3.  Assessment/Plan:   1. Vitamin D deficiency We will refill prescription vitamin D for 1 month, and we will recheck labs in 1 month.  - Vitamin D, Ergocalciferol, (DRISDOL) 1.25 MG (50000 UNIT) CAPS capsule; Take 1 capsule (50,000 Units total) by mouth every 7 (seven) days.  Dispense: 4 capsule; Refill: 0  2. Pre-diabetes Patient will continue metformin 500 mg twice daily, and we will refill for 1 month.  - metFORMIN (GLUCOPHAGE) 500 MG tablet; Take 1 tablet (500 mg total) by mouth 2 (two) times daily with a meal.  Dispense: 60 tablet; Refill: 0  3. BMI 33.0-33.9,adult  4. Obesity, Beginning BMI 36.94 Tonya Bartlett is currently in the action stage of change. As such, her goal is to continue with weight loss efforts. She has agreed to change to following a lower carbohydrate, vegetable and lean protein rich diet plan.   Exercise goals: As is.    Behavioral modification strategies: increasing lean protein intake, increasing water intake, and no skipping meals.  Tonya Bartlett has agreed to follow-up with our clinic in 4 weeks. She was informed of the importance of frequent follow-up visits to maximize her success with intensive lifestyle modifications for her multiple health conditions.   Objective:   Blood pressure (!) 152/85, pulse 95, temperature 97.7 F (36.5 C), height 5\' 6"  (1.676 m), weight 205 lb (93 kg), SpO2 97 %. Body mass index is 33.09 kg/m.  Lab Results  Component Value Date   CREATININE 0.67 03/03/2022   BUN 8 03/03/2022   NA 141 03/03/2022   K 4.2 03/03/2022   CL 105 03/03/2022   CO2 23 03/03/2022   Lab Results  Component Value Date   ALT 18 01/29/2022   AST 21 01/29/2022   ALKPHOS 46 01/29/2022   BILITOT 0.5 01/29/2022   Lab Results  Component Value Date   HGBA1C 5.3 01/29/2022   HGBA1C 5.3 06/11/2021   HGBA1C 5.4 04/24/2021   HGBA1C 5.3 11/05/2016   Lab Results  Component Value Date   INSULIN 5.9 01/29/2022   INSULIN 9.6 06/11/2021   Lab Results  Component Value Date   TSH 1.930 03/03/2022   Lab Results  Component Value Date   CHOL 147 01/29/2022   HDL 61 01/29/2022   LDLCALC 70 01/29/2022   LDLDIRECT 187.9 10/08/2009   TRIG 84 01/29/2022   CHOLHDL 2.7 06/11/2021  Lab Results  Component Value Date   VD25OH 34.3 06/11/2021   Lab Results  Component Value Date   WBC 5.6 03/03/2022   HGB 11.6 03/03/2022   HCT 34.2 03/03/2022   MCV 97 03/03/2022   PLT 267 03/03/2022   No results found for: "IRON", "TIBC", "FERRITIN"  Attestation Statements:   Reviewed by clinician on day of visit: allergies, medications, problem list, medical history, surgical history, family history, social history, and previous encounter notes.   I, Chasney Winstel, am acting as transcriptionist for Quillian Quince, MD.  I have reviewed the above documentation for accuracy and completeness, and I agree with the  above. -  Quillian Quince, MD

## 2022-07-24 ENCOUNTER — Encounter: Payer: Self-pay | Admitting: Gastroenterology

## 2022-07-24 ENCOUNTER — Encounter: Payer: Medicare Other | Admitting: Gastroenterology

## 2022-07-24 ENCOUNTER — Ambulatory Visit (AMBULATORY_SURGERY_CENTER): Payer: Medicare Other | Admitting: Gastroenterology

## 2022-07-24 VITALS — BP 122/67 | HR 101 | Temp 97.5°F | Resp 20 | Ht 65.0 in | Wt 210.0 lb

## 2022-07-24 DIAGNOSIS — R1310 Dysphagia, unspecified: Secondary | ICD-10-CM | POA: Diagnosis not present

## 2022-07-24 DIAGNOSIS — K317 Polyp of stomach and duodenum: Secondary | ICD-10-CM

## 2022-07-24 DIAGNOSIS — K449 Diaphragmatic hernia without obstruction or gangrene: Secondary | ICD-10-CM | POA: Diagnosis not present

## 2022-07-24 MED ORDER — SODIUM CHLORIDE 0.9 % IV SOLN
500.0000 mL | Freq: Once | INTRAVENOUS | Status: DC
Start: 2022-07-24 — End: 2022-07-24

## 2022-07-24 NOTE — Progress Notes (Signed)
History and Physical Interval Note: Patient seen on 06/29/22 - no interval changes. Symptoms of dysphagia. She has had a few EGDs in the past with empiric dilation, the last in 10/2020. Remotely this has helped her in the past, not as much on the last exam. Barium study shows some dysmotility. Here for repeat EGD with empiric dilation. Off Eliquis for 2 days. She otherwise feels well without complaints today.   07/24/2022 11:35 AM  Tonya Bartlett  has presented today for endoscopic procedure(s), with the diagnosis of  Encounter Diagnosis  Name Primary?   Dysphagia, unspecified type Yes  .  The various methods of evaluation and treatment have been discussed with the patient and/or family. After consideration of risks, benefits and other options for treatment, the patient has consented to  the endoscopic procedure(s).   The patient's history has been reviewed, patient examined, no change in status, stable for surgery.  I have reviewed the patient's chart and labs.  Questions were answered to the patient's satisfaction.    Harlin Rain, MD [facility]

## 2022-07-24 NOTE — Patient Instructions (Signed)
YOU HAD AN ENDOSCOPIC PROCEDURE TODAY AT THE [facility]:   Refer to the procedure report that was given to you for any specific questions about what was found during the examination.  If the procedure report does not answer your questions, please call your gastroenterologist to clarify.  If you requested that your care partner not be given the details of your procedure findings, then the procedure report has been included in a sealed envelope for you to review at your convenience later.  YOU SHOULD EXPECT: Some feelings of bloating in the abdomen. Passage of more gas than usual.  Walking can help get rid of the air that was put into your GI tract during the procedure and reduce the bloating. If you had a lower endoscopy (such as a colonoscopy or flexible sigmoidoscopy) you may notice spotting of blood in your stool or on the toilet paper. If you underwent a bowel prep for your procedure, you may not have a normal bowel movement for a few days.  Please Note:  You might notice some irritation and congestion in your nose or some drainage.  This is from the oxygen used during your procedure.  There is no need for concern and it should clear up in a day or so.  SYMPTOMS TO REPORT IMMEDIATELY:   Following upper endoscopy (EGD)  Vomiting of blood or coffee ground material  New chest pain or pain under the shoulder blades  Painful or persistently difficult swallowing  New shortness of breath  Fever of 100F or higher  Black, tarry-looking stools  For urgent or emergent issues, a gastroenterologist can be reached at any hour by calling [phone]. Do not use MyChart messaging for urgent concerns.    DIET:  We do recommend a small meal at first, but then you may proceed to your regular diet.  Drink plenty of fluids but you should avoid alcoholic beverages for 24 hours.  ACTIVITY:  You should plan to take it easy for the rest of today and you should NOT DRIVE or use heavy machinery  until tomorrow (because of the sedation medicines used during the test).    FOLLOW UP: Our staff will call the number listed on your records the next business day following your procedure.  We will call around 7:15- 8:00 am to check on you and address any questions or concerns that you may have regarding the information given to you following your procedure. If we do not reach you, we will leave a message.     If any biopsies were taken you will be contacted by phone or by letter within the next 1-3 weeks.  Please call us at [phone] if you have not heard about the biopsies in 3 weeks.    SIGNATURES/CONFIDENTIALITY: You and/or your care partner have signed paperwork which will be entered into your electronic medical record.  These signatures attest to the fact that that the information above on your After Visit Summary has been reviewed and is understood.  Full responsibility of the confidentiality of this discharge information lies with you and/or your care-partner. 

## 2022-07-24 NOTE — Op Note (Signed)
[facility] Patient Name: Tonya Bartlett Procedure Date: 07/24/2022 11:27 AM MRN: [id number] Endoscopist: Viviann Spare P. Adela Lank , MD, [id number] Age: 77 Referring MD:  Date of Birth: 28-Dec-1945 Gender: Female Account #: [id number] Procedure:                Upper GI endoscopy Indications:              Dysphagia - history of empiric dilation remotely                            which helped her, symptoms recurred and EGD 2022                            performed with dilation again which did not help as                            much. Barium swallow shows mild dysmotility.                            Recurrent dysphagia, she wished to try another                            empiric dilation. Off Eliquis for 2 days. Medicines:                Monitored Anesthesia Care Procedure:                Pre-Anesthesia Assessment:                           - Prior to the procedure, a History and Physical                            was performed, and patient medications and                            allergies were reviewed. The patient's tolerance of                            previous anesthesia was also reviewed. The risks                            and benefits of the procedure and the sedation                            options and risks were discussed with the patient.                            All questions were answered, and informed consent                            was obtained. Prior Anticoagulants: The patient has                            taken Eliquis (apixaban), last dose was 2 days  prior to procedure. ASA Grade Assessment: III - A                            patient with severe systemic disease. After                            reviewing the risks and benefits, the patient was                            deemed in satisfactory condition to undergo the                            procedure.                           After obtaining informed consent, the  endoscope was                            passed under direct vision. Throughout the                            procedure, the patient's blood pressure, pulse, and                            oxygen saturations were monitored continuously. The                            GIF [id number] #[id number] was introduced through the                            mouth, and advanced to the second part of duodenum.                            The upper GI endoscopy was accomplished without                            difficulty. The patient tolerated the procedure                            well. Scope In: Scope Out: Findings:                 Esophagogastric landmarks were identified: the                            Z-line was found at 37 cm, the gastroesophageal                            junction was found at 37 cm and the upper extent of                            the gastric folds was found at 39 cm from the  incisors.                           A 2 cm hiatal hernia was present.                           The exam of the esophagus was otherwise normal.                           A guidewire was placed and the scope was withdrawn.                            Empiric dilation was performed in the entire                            esophagus with a Savary dilator with mild                            resistance at 17 mm. Relook endoscopy showed no                            mucosal wrent.                           A few small sessile polyps were found in the                            gastric body. Previously biopsied in the past and                            benign.                           The exam of the stomach was otherwise normal.                           The examined duodenum was normal. Complications:            No immediate complications. Estimated blood loss:                            Minimal. Estimated Blood Loss:     Estimated blood loss was minimal. Impression:                - Esophagogastric landmarks identified.                           - 2 cm hiatal hernia.                           - Normal esophagus otherwise - empiric dilation                            performed to 17mm.                           - A few gastric  polyps, benign appearing.                            Previously biopsied, not biopsied today.                           - Normal stomach otherwise                           - Normal examined duodenum. Recommendation:           - Patient has a contact number available for                            emergencies. The signs and symptoms of potential                            delayed complications were discussed with the                            patient. Return to normal activities tomorrow.                            Written discharge instructions were provided to the                            patient.                           - Resume previous diet.                           - Continue present medications.                           - Resume Eliquis today                           - Await course post dilation, hopefully this will                            help her. If symptoms persist despite dilation,                            assume likely would be due to dysmotility in that                            setting Donita Newland P. Viraat Vanpatten, MD 07/24/2022 11:55:57 AM This report has been signed electronically.

## 2022-07-24 NOTE — Progress Notes (Signed)
A and O x3. Report to RN. Tolerated MAC anesthesia well.Teeth unchanged after procedure. 

## 2022-07-24 NOTE — Progress Notes (Signed)
VS completed by DT.  Pt's states no medical or surgical changes since previsit or office visit.  

## 2022-07-24 NOTE — Progress Notes (Signed)
Called to room to assist during endoscopic procedure.  Patient ID and intended procedure confirmed with present staff. Received instructions for my participation in the procedure from the performing physician.  

## 2022-07-27 ENCOUNTER — Telehealth: Payer: Self-pay

## 2022-07-27 NOTE — Telephone Encounter (Signed)
  Follow up Call-     07/24/2022   11:24 AM 11/18/2021    7:52 AM 10/17/2020    9:26 AM  Call back number  Post procedure Call Back phone  # [phone] #[phone] cell  Permission to leave phone message Yes Yes Yes     Patient questions:  Do you have a fever, pain , or abdominal swelling? No. Pain Score  0 *  Have you tolerated food without any problems? Yes.    Have you been able to return to your normal activities? Yes.    Do you have any questions about your discharge instructions: Diet   No. Medications  No. Follow up visit  No.  Do you have questions or concerns about your Care? No.  Actions: * If pain score is 4 or above: No action needed, pain <4.

## 2022-07-27 NOTE — Telephone Encounter (Signed)
Pt had procedure on 07/24/22

## 2022-07-28 ENCOUNTER — Encounter: Payer: Self-pay | Admitting: Cardiology

## 2022-07-29 DIAGNOSIS — H903 Sensorineural hearing loss, bilateral: Secondary | ICD-10-CM | POA: Diagnosis not present

## 2022-07-29 DIAGNOSIS — H6901 Patulous Eustachian tube, right ear: Secondary | ICD-10-CM | POA: Diagnosis not present

## 2022-07-29 DIAGNOSIS — H8111 Benign paroxysmal vertigo, right ear: Secondary | ICD-10-CM | POA: Diagnosis not present

## 2022-08-03 ENCOUNTER — Ambulatory Visit: Payer: Medicare Other | Attending: Cardiology

## 2022-08-03 DIAGNOSIS — I480 Paroxysmal atrial fibrillation: Secondary | ICD-10-CM

## 2022-08-03 DIAGNOSIS — I10 Essential (primary) hypertension: Secondary | ICD-10-CM | POA: Diagnosis not present

## 2022-08-04 LAB — CBC WITH DIFFERENTIAL/PLATELET
Basophils Absolute: 0.1 10*3/uL (ref 0.0–0.2)
Basos: 2 %
EOS (ABSOLUTE): 0.4 10*3/uL (ref 0.0–0.4)
Eos: 8 %
Hematocrit: 35.8 % (ref 34.0–46.6)
Hemoglobin: 11.9 g/dL (ref 11.1–15.9)
Immature Grans (Abs): 0 10*3/uL (ref 0.0–0.1)
Immature Granulocytes: 0 %
Lymphocytes Absolute: 1.1 10*3/uL (ref 0.7–3.1)
Lymphs: 23 %
MCH: 34 pg — ABNORMAL HIGH (ref 26.6–33.0)
MCHC: 33.2 g/dL (ref 31.5–35.7)
MCV: 102 fL — ABNORMAL HIGH (ref 79–97)
Monocytes Absolute: 0.3 10*3/uL (ref 0.1–0.9)
Monocytes: 7 %
Neutrophils Absolute: 2.8 10*3/uL (ref 1.4–7.0)
Neutrophils: 60 %
Platelets: 244 10*3/uL (ref 150–450)
RBC: 3.5 x10E6/uL — ABNORMAL LOW (ref 3.77–5.28)
RDW: 11.8 % (ref 11.7–15.4)
WBC: 4.8 10*3/uL (ref 3.4–10.8)

## 2022-08-04 LAB — BASIC METABOLIC PANEL
BUN/Creatinine Ratio: 13 (ref 12–28)
BUN: 10 mg/dL (ref 8–27)
CO2: 27 mmol/L (ref 20–29)
Calcium: 9.3 mg/dL (ref 8.7–10.3)
Chloride: 103 mmol/L (ref 96–106)
Creatinine, Ser: 0.76 mg/dL (ref 0.57–1.00)
Glucose: 127 mg/dL — ABNORMAL HIGH (ref 70–99)
Potassium: 3.7 mmol/L (ref 3.5–5.2)
Sodium: 141 mmol/L (ref 134–144)
eGFR: 81 mL/min/{1.73_m2} (ref >59)

## 2022-08-10 ENCOUNTER — Ambulatory Visit (HOSPITAL_COMMUNITY)
Admission: RE | Admit: 2022-08-10 | Discharge: 2022-08-10 | Disposition: A | Discharge status: Home / Self Care | Payer: Medicare Other | Source: Ambulatory Visit | Attending: Cardiology | Admitting: Cardiology

## 2022-08-10 ENCOUNTER — Ambulatory Visit (HOSPITAL_COMMUNITY): Admission: RE | Admit: 2022-08-10 | Payer: Medicare Other | Source: Ambulatory Visit

## 2022-08-10 DIAGNOSIS — I480 Paroxysmal atrial fibrillation: Secondary | ICD-10-CM | POA: Diagnosis not present

## 2022-08-10 DIAGNOSIS — I10 Essential (primary) hypertension: Secondary | ICD-10-CM | POA: Insufficient documentation

## 2022-08-10 MED ORDER — IOHEXOL 350 MG/ML SOLN
100.0000 mL | Freq: Once | INTRAVENOUS | Status: AC | PRN
  Administered 2022-08-10: 100 mL via INTRAVENOUS

## 2022-08-11 ENCOUNTER — Encounter: Payer: Medicare Other | Admitting: Gastroenterology

## 2022-08-12 ENCOUNTER — Telehealth (HOSPITAL_COMMUNITY): Payer: Medicare Other | Admitting: Psychiatry

## 2022-08-14 ENCOUNTER — Telehealth: Payer: Self-pay | Admitting: Cardiology

## 2022-08-14 ENCOUNTER — Encounter (HOSPITAL_COMMUNITY): Payer: Self-pay | Admitting: Psychiatry

## 2022-08-14 ENCOUNTER — Telehealth (INDEPENDENT_AMBULATORY_CARE_PROVIDER_SITE_OTHER): Payer: Medicare Other | Admitting: Psychiatry

## 2022-08-14 DIAGNOSIS — F1021 Alcohol dependence, in remission: Secondary | ICD-10-CM

## 2022-08-14 DIAGNOSIS — F5102 Adjustment insomnia: Secondary | ICD-10-CM | POA: Diagnosis not present

## 2022-08-14 DIAGNOSIS — F331 Major depressive disorder, recurrent, moderate: Secondary | ICD-10-CM

## 2022-08-14 MED ORDER — VENLAFAXINE HCL ER 75 MG PO CP24: 75.0000 mg | ORAL_CAPSULE | Freq: Every day | ORAL | 0 refills | Status: DC

## 2022-08-14 NOTE — Telephone Encounter (Signed)
Returned call to patient.  She is scheduled for an A-Fib ablation procedure with Dr. Lalla Brothers on Monday 08/17/22. She is requesting instructions on medication, specifically if she needs to hold her Eliquis.  Informed patient that she should not miss any doses of her Eliquis leading up to her procedure date. She states she only missed this mornings dose of Eliquis. Instructed patient to take that missed dose now and her second dose about 6-8 hours later, then resume normal dosing times.  Reviewed ablation instructions. Copy sent to patient via MyChart message.  Patient verbalized understanding and expressed appreciation for call.

## 2022-08-14 NOTE — Telephone Encounter (Signed)
  Pt is calling to get her instructions for her ablation on Monday. She wants to know if she need to hold her eliquis, she did not take it today.

## 2022-08-14 NOTE — Progress Notes (Signed)
[facility] Outpatient visit Tele psych  Patient Identification: Tonya Bartlett MRN:  [id number] Date of Evaluation:  08/14/2022 Referral Source: NP Chief Complaint:   Depression follow up  Visit Diagnosis:    ICD-10-CM   1. Major depressive disorder, recurrent episode, moderate (HCC)  F33.1 venlafaxine XR (EFFEXOR-XR) 75 MG 24 hr capsule    2. Adjustment insomnia  F51.02     3. Alcohol dependence in remission [facility])  F10.21     Virtual Visit via Video Note  I connected with Tonya Bartlett on 08/14/22 at 11:30 AM EDT by a video enabled telemedicine application and verified that I am speaking with the correct person using two identifiers.  Location: Patient: home Provider: home office   I discussed the limitations of evaluation and management by telemedicine and the availability of in person appointments. The patient expressed understanding and agreed to proceed.     I discussed the assessment and treatment plan with the patient. The patient was provided an opportunity to ask questions and all were answered. The patient agreed with the plan and demonstrated an understanding of the instructions.   The patient was advised to call back or seek an in-person evaluation if the symptoms worsen or if the condition fails to improve as anticipated.  I provided 15 minutes of non-face-to-face time during this encounter.     History of Present Illness: 77 years old currently widowed white female lives by herself has 3 grown kids and 6 grandkids referred initially by family medicine for management of depression  Remains sober 40 plus years, attends and chair AA groups  Has had breast surgery daignosed with cancer, recovering, on radiation.   On tomoxifen  Effexor helps with depression sleep fair with trazadone Getting ablation done for heart condition this monday    Modifying factors :family  daughter is now communicating aggravating factors. Hip surgery, breast surgery, medical  conditions Severity manageable  Past Psychiatric History: depression  Previous Psychotropic Medications: Yes   Substance Abuse History in the last 12 months:  No.  Consequences of Substance Abuse: NA  Past Medical History:  Past Medical History:  Diagnosis Date   Alcohol abuse    Sober 41 years.   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  1996   History of hiatal hernia    History of iron deficiency anemia 1996   iron infusion   History of migraine headaches    History of radiation therapy    left breast 03/27/2021-04/23/2021 Dr Antony Blackbird   History of rib fracture    Hyperlipidemia    Hypertension    IBS (irritable bowel syndrome)    Insomnia    Joint pain    MCI (mild cognitive impairment)    Melanoma (HCC) 2019  left leg    Migraine headache    hx of migraines    OA (osteoarthritis)    RIGHT SHOULDER   Osteopenia    Pneumonia    remote history   PONV (postoperative nausea and vomiting)    Recovering alcoholic (HCC)    SINCE 01-21-1980   Right rotator cuff tear    SOB (shortness of breath) on exertion    Substance abuse (HCC)    recovering alcoholic   Swallowing difficulty    Unspecified essential hypertension     Past Surgical History:  Procedure Laterality Date   BREAST BIOPSY Left 01/06/2021   pos   BREAST LUMPECTOMY WITH RADIOACTIVE SEED LOCALIZATION Left 02/27/2021   Procedure: LEFT BREAST LUMPECTOMY WITH RADIOACTIVE SEED  LOCALIZATION;  Surgeon: Almond Lint, MD;  Location: [facility] OR;  Service: General;  Laterality: Left;   BUNIONECTOMY/  HAMMERTOE CORRECTION  RIGHT FOOT  2011   CATARACT EXTRACTION W/ INTRAOCULAR LENS  IMPLANT, BILATERAL     COLONOSCOPY     DILATION AND CURETTAGE OF UTERUS  1976   EYE SURGERY Bilateral 2016   cataract removal   KNEE ARTHROSCOPY W/ MENISCECTOMY Bilateral X2  LEFT /    X1  RIGHT   KNEE OPEN LATERAL RELEASE Bilateral    MOHS SURGERY Left 12/15/2017   Melanoma in situ - left calf - [facility]   ORIF ANKLE FRACTURE Right 08/04/2020   Procedure: OPEN REDUCTION INTERNAL FIXATION (ORIF) ANKLE FRACTURE;  Surgeon: Toni Arthurs, MD;  Location: [facility] ORS;  Service: Orthopedics;  Laterality: Right;  Mini C-arm, [employer] Small Frag   REPLACEMENT TOTAL KNEE Left 2006   SHOULDER ARTHROSCOPY WITH SUBACROMIAL DECOMPRESSION, ROTATOR CUFF REPAIR AND BICEP TENDON REPAIR Right 05/23/2013   Procedure: RIGHT SHOULDER ARTHROSCOPY EXAM UNDER ANESTHESIA  WITH SUBACROMIAL DECOMPRESSION,DISTAL CLAVICLE RESECTION, SADLABRAL DEBRIDEMENT CHONDROPLASTY, BICEP TENOTOMY ;  Surgeon: Eugenia Mcalpine, MD;  Location: [facility];  Service: Orthopedics;  Laterality: Right;   TONSILLECTOMY AND ADENOIDECTOMY  AGE 75   TOTAL HIP ARTHROPLASTY Left 05/04/2017   Procedure: LEFT TOTAL HIP ARTHROPLASTY ANTERIOR APPROACH;  Surgeon: Durene Romans, MD;  Location: [facility] ORS;  Service: Orthopedics;  Laterality: Left;   TOTAL HIP ARTHROPLASTY Right 08/01/2019   Procedure: TOTAL HIP ARTHROPLASTY ANTERIOR APPROACH;  Surgeon: Durene Romans, MD;  Location: [facility];  Service: Orthopedics;  Laterality: Right;    TOTAL KNEE ARTHROPLASTY Bilateral LEFT  1996/   RIGHT 2004   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      UPPER GASTROINTESTINAL ENDOSCOPY     UPPER GI ENDOSCOPY     VAGINAL HYSTERECTOMY  1976    Family Psychiatric History: mom possible depression  Family History:  Family History  Problem Relation Age of  Onset   Cancer Mother    Heart disease Mother    Hypertension Mother    Melanoma Mother 69   Obesity Mother    Heart disease Father 39   Hypertension Father    Esophageal cancer Brother    Dementia Paternal Grandfather    Melanoma Niece 73   Colon cancer Neg Hx    Colon polyps Neg Hx    Stomach cancer Neg Hx    Rectal cancer Neg Hx     Social History:   Social History   Socioeconomic History   Marital status: Widowed    Spouse name: Not on file   Number of children: 3   Years of education: Not on file   Highest education level: Master's degree (  e.g., MA, MS, MEng, MEd, MSW, MBA)  Occupational History   Occupation: retired Paramedic  Tobacco Use   Smoking status: Former    Packs/day: 0.50    Years: 15.00    Additional pack years: 0.00    Total pack years: 7.50    Types: Cigarettes    Quit date: 05/15/1985    Years since quitting: 37.2   Smokeless tobacco: Never  Vaping Use   Vaping Use: Never used  Substance and Sexual Activity   Alcohol use: No    Comment: RECOVERING ALCOHOLIC--  QUIT [id number]   Drug use: No   Sexual activity: Not Currently    Birth control/protection: Post-menopausal  Other Topics Concern   Not on file  Social History Narrative   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    Widowed    Three kids    6 grandchildren       Social Determinants of Health   Financial Resource Strain: Low Risk  (04/18/2021)   Overall Financial Resource Strain (CARDIA)    Difficulty of Paying Living Expenses: Not hard at all  Food Insecurity: No Food Insecurity (04/18/2021)   Hunger Vital Sign    Worried About Running Out of Food in the Last Year: Never true    Ran Out of Food in the Last Year: Never true  Transportation Needs: No Transportation Needs (04/18/2021)   PRAPARE - Administrator, Civil Service (Medical): No    Lack of Transportation (Non-Medical): No  Physical Activity: Inactive (04/18/2021)   Exercise  Vital Sign    Days of Exercise per Week: 0 days    Minutes of Exercise per Session: 0 min  Stress: No Stress Concern Present (04/18/2021)   [employer] of Occupational Health - Occupational Stress Questionnaire    Feeling of Stress : Not at all  Social Connections: Moderately Integrated (04/18/2021)   Social Connection and Isolation Panel [NHANES]    Frequency of Communication with Friends and Family: More than three times a week    Frequency of Social Gatherings with Friends and Family: More than three times a week    Attends Religious Services: More than 4 times per year    Active Member of [employer] or Organizations: Yes    Attends Banker Meetings: More than 4 times per year    Marital Status: Widowed     Allergies:   Allergies  Allergen Reactions   Nsaids Other (See Comments) and Anaphylaxis    SEVERE STOMACH CRAMPS, MOUTH SORES  **Able to tolerate Tylenol   Otezla [Apremilast] Anaphylaxis    Suicidal ideation   Penicillins Anaphylaxis and Other (See Comments)    Has patient had a PCN reaction causing immediate rash, facial/tongue/throat swelling, SOB or lightheadedness with hypotension:  Yes  Has patient had a PCN reaction causing severe rash involving mucus membranes or skin necrosis: Yes  Has patient had a PCN reaction that required hospitalization: No  Has patient had a PCN reaction occurring within the last 10 year No  If all of the above answers are "NO", then may proceed with Cephalosporin use.   Erythromycin Other (See Comments)    SEVERE STOMACH CRAMPS   Morphine And Codeine Nausea And Vomiting   Penicillamine Other (See Comments)   Remicade [Infliximab] Other (See Comments)    Shut down immune system-BP high   Risankizumab Other (See Comments)    URI, fever, cough, UTI   Tolmetin     SEVERE  STOMACH CRAMPS   Nickel Rash    Including snaps on hospital gowns     Metabolic Disorder Labs: Lab Results  Component Value Date   HGBA1C 5.3  01/29/2022   No results found for: "PROLACTIN" Lab Results  Component Value Date   CHOL 147 01/29/2022   TRIG 84 01/29/2022   HDL 61 01/29/2022   CHOLHDL 2.7 06/11/2021   VLDL 81.1 04/24/2021   LDLCALC 70 01/29/2022   LDLCALC 90 06/11/2021     Current Medications: Current Outpatient Medications  Medication Sig Dispense Refill   acetaminophen (TYLENOL) 325 MG tablet Take 650 mg by mouth every 6 (six) hours as needed for moderate pain.     Ascorbic Acid (VITAMIN C) 1000 MG tablet Take 1,000 mg by mouth daily.     atorvastatin (LIPITOR) 40 MG tablet Take 1 tablet (40 mg total) by mouth daily. 90 tablet 3   betamethasone dipropionate 0.05 % lotion Apply topically 2 (two) times daily. 60 mL 0   betamethasone, augmented, (DIPROLENE) 0.05 % lotion Apply topically 2 (two) times daily. (Patient not taking: Reported on 08/11/2022) 60 mL 0   cetirizine (ZYRTEC) 10 MG tablet Take 10 mg by mouth daily.     ELIQUIS 5 MG TABS tablet TAKE 1 TABLET BY MOUTH TWICE A DAY 60 tablet 5   famotidine (PEPCID) 20 MG tablet Take 1 tablet (20 mg total) by mouth daily as needed for heartburn or indigestion. (Patient taking differently: Take 20 mg by mouth daily as needed (hyena hernia).) 90 tablet 3   HYDROcodone-acetaminophen (NORCO/VICODIN) 5-325 MG tablet Take 2 tablets by mouth every 4 (four) hours as needed for moderate pain or severe pain.     hydrocortisone cream 1 % Apply 1 application topically daily as needed for itching.     L-Methylfolate-B12-B6-B2 (CEREFOLIN) 07-17-48-5 MG TABS Take 1 tablet by mouth daily. 90 tablet 1   losartan (COZAAR) 100 MG tablet Take 1 tablet (100 mg total) by mouth daily. 90 tablet 3   metFORMIN (GLUCOPHAGE) 500 MG tablet Take 1 tablet (500 mg total) by mouth 2 (two) times daily with a meal. (Patient taking differently: Take 500 mg by mouth 2 (two) times daily with a meal. Weight control) 60 tablet 0   metoprolol tartrate (LOPRESSOR) 25 MG tablet TAKE 0.5 TABLETS BY MOUTH 2  TIMES DAILY. (Patient taking differently: Take 25 mg by mouth 2 (two) times daily.) 90 tablet 1   omeprazole (PRILOSEC) 40 MG capsule TAKE 1 CAPSULE (40 MG TOTAL) BY MOUTH DAILY. 90 capsule 2   omeprazole (PRILOSEC) 40 MG capsule Take 1 capsule (40 mg total) by mouth 2 (two) times daily. Take 30 minutes before breakfast & dinner. (Patient not taking: Reported on 08/11/2022) 60 capsule 2   rizatriptan (MAXALT) 10 MG tablet Take 1 tablet (10 mg total) by mouth as needed for migraine. May repeat in 2 hours if needed 10 tablet 2   Soft Lens Products (REWETTING DROPS) SOLN Place 1 drop into both eyes daily as needed (Dry eyes).     sulfamethoxazole-trimethoprim (BACTRIM,SEPTRA) 400-80 MG tablet Take 1 tablet by mouth at bedtime. For urethritis.     tamoxifen (NOLVADEX) 10 MG tablet Take 1/2 tablet by mouth in the evening     traZODone (DESYREL) 50 MG tablet TAKE 1 TABLET BY MOUTH EVERYDAY AT BEDTIME 90 tablet 1   venlafaxine XR (EFFEXOR-XR) 75 MG 24 hr capsule Take 1 capsule (75 mg total) by mouth daily. 90 capsule 0   Vitamin D, Ergocalciferol, (  DRISDOL) 1.25 MG (50000 UNIT) CAPS capsule Take 1 capsule (50,000 Units total) by mouth every 7 (seven) days. 4 capsule 0   vitamin E 180 MG (400 UNITS) capsule Take 400 Units by mouth daily.     No current facility-administered medications for this visit.     Psychiatric Specialty Exam: Review of Systems  Cardiovascular:  Negative for chest pain.  Psychiatric/Behavioral:  Negative for depression and suicidal ideas.     There were no vitals taken for this visit.There is no height or weight on file to calculate BMI.  General Appearance: casual  Eye Contact:  fair  Speech:  Normal Rate  Volume:  Decreased  Mood: some better  Affect: congruent  Thought Process:  Goal Directed  Orientation:  Full (Time, Place, and Person)  Thought Content:  Rumination  Suicidal Thoughts:  No  Homicidal Thoughts:  No  Memory:  Immediate;   Fair Recent;   Fair   Judgement:  Fair  Insight:  Fair  Psychomotor Activity:  Normal  Concentration:  Concentration: Fair and Attention Span: Fair  Recall:  [employer] of Knowledge:Good  Language: Good  Akathisia:  No  Handed:  Right  AIMS (if indicated):    Assets:  Desire for Improvement  ADL's:  Intact  Cognition: Impaired,  Mild  Sleep:  Variable to fair on meds    Treatment Plan Summary: Medication management and Plan as follows    Prior documentation reviewed  1. MDD, mild to moderate: fair continue effexor  2. Insomnia: manageable on trazadone, will continue  she gets from PCP  Alcohol dependence:sustained remission, continue AA and  groups  Fu 93m. Refills due were sent    Thresa Ross, MD 6/28/202411:37 AM

## 2022-08-14 NOTE — Pre-Procedure Instructions (Signed)
Attempted to call patient regarding procedure instructions.  Left voicemail on the following items: Arrival time 0515 Nothing to eat or drink after midnight No meds AM of procedure Responsible person to drive you home and stay with you for 24 hrs  Have you missed any doses of anti-coagulant Eliquis- should be taken twice a day, if you have missed any doses please let the office know right way.  Don't take dose on Monday morning.

## 2022-08-16 ENCOUNTER — Encounter (HOSPITAL_COMMUNITY): Payer: Self-pay | Admitting: Anesthesiology

## 2022-08-16 DIAGNOSIS — U071 COVID-19: Secondary | ICD-10-CM | POA: Diagnosis not present

## 2022-08-16 NOTE — Anesthesia Preprocedure Evaluation (Signed)
Anesthesia Evaluation    Reviewed: Allergy & Precautions, Patient's Chart, lab work & pertinent test results, Unable to perform ROS - Chart review only  History of Anesthesia Complications (+) PONV and history of anesthetic complications  Airway        Dental   All caps:   Pulmonary pneumonia, former smoker          Cardiovascular hypertension, Pt. on medications and Pt. on home beta blockers + CAD and + DVT (in setting of postop)  + dysrhythmias Atrial Fibrillation   Echo 11/2020  1. Left ventricular ejection fraction by 3D volume is 73 %. The left ventricle has hyperdynamic function. The left ventricle has no regional wall motion abnormalities. Left ventricular diastolic parameters are consistent with Grade I diastolic dysfunction (impaired relaxation). The average left ventricular global longitudinal strain is -25.1%. The global longitudinal strain is normal.   2. Right ventricular systolic function is normal. The right ventricular  size is normal.   3. Left atrial size was mildly dilated.   4. The mitral valve is normal in structure. Mild mitral valve regurgitation. No evidence of mitral stenosis.   5. The aortic valve is tricuspid. There is mild calcification of the aortic valve. There is mild thickening of the aortic valve. Aortic valve regurgitation is not visualized. Mild to moderate aortic valve sclerosis/calcification is present, without any evidence of aortic stenosis.   6. The inferior vena cava is normal in size with greater than 50% respiratory variability, suggesting right atrial pressure of 3 mmHg.     Coronary calcium CT showed coronary calcium score of 24 (37th percentile for age, gender, and race matched controls), aortic atherosclerosis, 42 mm ascending aorta, and AV calcium score of 99 with no AS by echo. Echo was unremarkable. Event monitor showed long runs of PAF. Eliquis was started for CHA2DSVASC2 score of 4. She  was also started on atorvastatin for coronary and aortic calcifications. He also referred her to Rudi Coco, NP at the [facility] seen on 02/06/21. Metoprolol tartrate 25 mg 1/2 tab BID started due to on-going short erratic HR episodes per her smart watch. She already had preoperative cardiology input outline by Edd Fabian, NP on 01/17/21 indicating patient was felt to be "acceptable risk for the planned procedure without further cardiovascular testing." She was given permission to hold Eliqus for 2 days prior to surgery.     Neuro/Psych  Headaches PSYCHIATRIC DISORDERS Anxiety Depression       GI/Hepatic hiatal hernia,GERD  Controlled and Medicated,,(+)     substance abuse (remote history)  alcohol use  Endo/Other  negative endocrine ROS    Renal/GU negative Renal ROS     Musculoskeletal  (+) Arthritis ,    Abdominal   Peds  Hematology  (+) Blood dyscrasia, anemia   Anesthesia Other Findings   Reproductive/Obstetrics                              Anesthesia Physical Anesthesia Plan  ASA: 3  Anesthesia Plan: General   Post-op Pain Management:  Regional for Post-op pain and Tylenol PO (pre-op)*   Induction: Intravenous  PONV Risk Score and Plan: 4 or greater and Ondansetron, Dexamethasone, Midazolam and Treatment may vary due to age or medical condition  Airway Management Planned: Oral ETT  Additional Equipment:   Intra-op Plan:   Post-operative Plan: Extubation in OR  Informed Consent:   Plan Discussed with:   Anesthesia Plan Comments:  Anesthesia Quick Evaluation  

## 2022-08-17 ENCOUNTER — Ambulatory Visit (HOSPITAL_COMMUNITY): Admission: RE | Admit: 2022-08-17 | Payer: Medicare Other | Source: Home / Self Care | Admitting: Cardiology

## 2022-08-17 ENCOUNTER — Encounter (HOSPITAL_COMMUNITY): Admission: RE | Payer: Self-pay | Source: Home / Self Care

## 2022-08-17 SURGERY — ATRIAL FIBRILLATION ABLATION
Anesthesia: General

## 2022-08-19 ENCOUNTER — Ambulatory Visit (INDEPENDENT_AMBULATORY_CARE_PROVIDER_SITE_OTHER): Payer: Medicare Other | Admitting: Family Medicine

## 2022-08-19 ENCOUNTER — Other Ambulatory Visit: Payer: Self-pay

## 2022-08-19 DIAGNOSIS — I480 Paroxysmal atrial fibrillation: Secondary | ICD-10-CM

## 2022-08-25 ENCOUNTER — Other Ambulatory Visit: Payer: Self-pay | Admitting: Neurology

## 2022-09-01 ENCOUNTER — Other Ambulatory Visit: Payer: Self-pay | Admitting: Neurology

## 2022-09-02 DIAGNOSIS — M25571 Pain in right ankle and joints of right foot: Secondary | ICD-10-CM | POA: Diagnosis not present

## 2022-09-04 DIAGNOSIS — R09A2 Foreign body sensation, throat: Secondary | ICD-10-CM | POA: Diagnosis not present

## 2022-09-04 DIAGNOSIS — L4052 Psoriatic arthritis mutilans: Secondary | ICD-10-CM | POA: Diagnosis not present

## 2022-09-04 DIAGNOSIS — K219 Gastro-esophageal reflux disease without esophagitis: Secondary | ICD-10-CM | POA: Diagnosis not present

## 2022-09-04 DIAGNOSIS — R1310 Dysphagia, unspecified: Secondary | ICD-10-CM | POA: Diagnosis not present

## 2022-09-04 DIAGNOSIS — M150 Primary generalized (osteo)arthritis: Secondary | ICD-10-CM | POA: Diagnosis not present

## 2022-09-04 DIAGNOSIS — G894 Chronic pain syndrome: Secondary | ICD-10-CM | POA: Diagnosis not present

## 2022-09-04 DIAGNOSIS — M47812 Spondylosis without myelopathy or radiculopathy, cervical region: Secondary | ICD-10-CM | POA: Diagnosis not present

## 2022-09-14 ENCOUNTER — Ambulatory Visit (HOSPITAL_COMMUNITY): Payer: Medicare Other | Admitting: Physician Assistant

## 2022-09-16 ENCOUNTER — Other Ambulatory Visit: Payer: Self-pay | Admitting: Cardiovascular Disease

## 2022-09-16 ENCOUNTER — Other Ambulatory Visit: Payer: Self-pay | Admitting: Adult Health

## 2022-09-23 ENCOUNTER — Ambulatory Visit: Payer: Medicare Other | Attending: Cardiology

## 2022-09-23 DIAGNOSIS — I480 Paroxysmal atrial fibrillation: Secondary | ICD-10-CM

## 2022-09-24 LAB — CBC
Hematocrit: 34.9 % (ref 34.0–46.6)
Hemoglobin: 11.7 g/dL (ref 11.1–15.9)
MCH: 33.4 pg — ABNORMAL HIGH (ref 26.6–33.0)
MCHC: 33.5 g/dL (ref 31.5–35.7)
MCV: 100 fL — ABNORMAL HIGH (ref 79–97)
Platelets: 255 10*3/uL (ref 150–450)
RBC: 3.5 x10E6/uL — ABNORMAL LOW (ref 3.77–5.28)
RDW: 12 % (ref 11.7–15.4)
WBC: 4.6 10*3/uL (ref 3.4–10.8)

## 2022-09-28 ENCOUNTER — Other Ambulatory Visit: Payer: Medicare Other

## 2022-09-30 ENCOUNTER — Ambulatory Visit: Payer: Medicare Other

## 2022-10-07 DIAGNOSIS — M67961 Unspecified disorder of synovium and tendon, right lower leg: Secondary | ICD-10-CM | POA: Diagnosis not present

## 2022-10-09 ENCOUNTER — Telehealth: Payer: Self-pay

## 2022-10-09 NOTE — Telephone Encounter (Signed)
Spoke with the patient and she is aware that her ablation time has been moved to 1030. She will need to arrive at the hospital at 830.

## 2022-10-12 NOTE — Pre-Procedure Instructions (Signed)
Attempted to call patient regarding procedure instructions for tomorrow.  Left voicemail on the following items: Arrival time 0800 Nothing to eat or drink after midnight No meds AM of procedure Responsible person to drive you home and stay with you for 24 hrs  Have you missed any doses of anti-coagulant Eliquis- should be taken twice a day, if you have missed any dose please let us know.  Don't take dose in the morning.

## 2022-10-12 NOTE — Anesthesia Preprocedure Evaluation (Signed)
Anesthesia Evaluation  Patient identified by MRN, date of birth, ID band Patient awake    Reviewed: Allergy & Precautions, NPO status , Patient's Chart, lab work & pertinent test results  History of Anesthesia Complications (+) history of anesthetic complications  Airway Mallampati: II  TM Distance: >3 FB Neck ROM: Full    Dental no notable dental hx. (+) Teeth Intact, Dental Advisory Given   Pulmonary former smoker   Pulmonary exam normal breath sounds clear to auscultation       Cardiovascular hypertension, + CAD and + DVT (1996)  Normal cardiovascular exam+ dysrhythmias (on eliquis) Atrial Fibrillation  Rhythm:Irregular Rate:Normal     Neuro/Psych   Anxiety Depression     Neuromuscular disease    GI/Hepatic hiatal hernia,GERD  Controlled and Medicated,,  Endo/Other    Renal/GU      Musculoskeletal  (+) Arthritis ,    Abdominal  (+) + obese  Peds  Hematology Lab Results      Component                Value               Date                      WBC                      4.6                 09/23/2022                HGB                      11.7                09/23/2022                HCT                      34.9                09/23/2022                MCV                      100 (H)             09/23/2022                PLT                      255                 09/23/2022              Anesthesia Other Findings All: see list  S/P lumpectmy 1996  Reproductive/Obstetrics                             Anesthesia Physical Anesthesia Plan  ASA: 3  Anesthesia Plan: General   Post-op Pain Management: Ofirmev IV (intra-op)* and Minimal or no pain anticipated   Induction: Intravenous  PONV Risk Score and Plan: 3 and Ondansetron, Dexamethasone, Propofol infusion and Treatment may vary due to age or medical condition  Airway Management Planned: Oral ETT  Additional Equipment:  None  Intra-op Plan:   Post-operative Plan: Extubation  in OR  Informed Consent: I have reviewed the patients History and Physical, chart, labs and discussed the procedure including the risks, benefits and alternatives for the proposed anesthesia with the patient or authorized representative who has indicated his/her understanding and acceptance.     Dental advisory given  Plan Discussed with: CRNA  Anesthesia Plan Comments:         Anesthesia Quick Evaluation

## 2022-10-13 ENCOUNTER — Other Ambulatory Visit: Payer: Self-pay | Admitting: Nurse Practitioner

## 2022-10-13 ENCOUNTER — Ambulatory Visit (HOSPITAL_COMMUNITY)
Admission: EM | Disposition: A | Discharge status: Home / Self Care | Payer: Self-pay | Source: Home / Self Care | Attending: Cardiology

## 2022-10-13 ENCOUNTER — Other Ambulatory Visit: Payer: Self-pay

## 2022-10-13 ENCOUNTER — Ambulatory Visit (HOSPITAL_BASED_OUTPATIENT_CLINIC_OR_DEPARTMENT_OTHER): Payer: Medicare Other | Admitting: Certified Registered"

## 2022-10-13 ENCOUNTER — Ambulatory Visit (HOSPITAL_COMMUNITY): Payer: Medicare Other | Admitting: Certified Registered"

## 2022-10-13 ENCOUNTER — Other Ambulatory Visit (HOSPITAL_COMMUNITY): Payer: Self-pay

## 2022-10-13 ENCOUNTER — Ambulatory Visit (HOSPITAL_COMMUNITY)
Admission: EM | Admit: 2022-10-13 | Discharge: 2022-10-13 | Disposition: A | Discharge status: Home / Self Care | Payer: Medicare Other | Attending: Cardiology | Admitting: Cardiology

## 2022-10-13 DIAGNOSIS — I10 Essential (primary) hypertension: Secondary | ICD-10-CM | POA: Diagnosis not present

## 2022-10-13 DIAGNOSIS — I4891 Unspecified atrial fibrillation: Secondary | ICD-10-CM

## 2022-10-13 DIAGNOSIS — I480 Paroxysmal atrial fibrillation: Secondary | ICD-10-CM | POA: Diagnosis not present

## 2022-10-13 DIAGNOSIS — I2510 Atherosclerotic heart disease of native coronary artery without angina pectoris: Secondary | ICD-10-CM | POA: Diagnosis not present

## 2022-10-13 DIAGNOSIS — Z87891 Personal history of nicotine dependence: Secondary | ICD-10-CM

## 2022-10-13 DIAGNOSIS — Z7901 Long term (current) use of anticoagulants: Secondary | ICD-10-CM | POA: Diagnosis not present

## 2022-10-13 DIAGNOSIS — Z79899 Other long term (current) drug therapy: Secondary | ICD-10-CM | POA: Diagnosis not present

## 2022-10-13 HISTORY — PX: ATRIAL FIBRILLATION ABLATION: EP1191

## 2022-10-13 LAB — POCT ACTIVATED CLOTTING TIME: Activated Clotting Time: 330 seconds

## 2022-10-13 SURGERY — ATRIAL FIBRILLATION ABLATION
Anesthesia: General

## 2022-10-13 MED ORDER — COLCHICINE 0.6 MG PO TABS
0.6000 mg | ORAL_TABLET | Freq: Two times a day (BID) | ORAL | 0 refills | Status: DC
  Filled 2022-10-13: qty 10, 5d supply, fill #0

## 2022-10-13 MED ORDER — HEPARIN (PORCINE) IN NACL 1000-0.9 UT/500ML-% IV SOLN
INTRAVENOUS | Status: DC | PRN
  Administered 2022-10-13 (×4): 500 mL

## 2022-10-13 MED ORDER — DEXAMETHASONE SODIUM PHOSPHATE 10 MG/ML IJ SOLN
INTRAMUSCULAR | Status: DC | PRN
  Administered 2022-10-13: 5 mg via INTRAVENOUS

## 2022-10-13 MED ORDER — SODIUM CHLORIDE 0.9 % IV SOLN: INTRAVENOUS | Status: DC

## 2022-10-13 MED ORDER — ATROPINE SULFATE 0.4 MG/ML IV SOLN
INTRAVENOUS | Status: DC | PRN
  Administered 2022-10-13: .5 mg via INTRAVENOUS

## 2022-10-13 MED ORDER — ONDANSETRON HCL 4 MG/2ML IJ SOLN: 4.0000 mg | Freq: Four times a day (QID) | INTRAMUSCULAR | Status: DC | PRN

## 2022-10-13 MED ORDER — SODIUM CHLORIDE 0.9% FLUSH: 3.0000 mL | Freq: Two times a day (BID) | INTRAVENOUS | Status: DC

## 2022-10-13 MED ORDER — PROTAMINE SULFATE 10 MG/ML IV SOLN
INTRAVENOUS | Status: DC | PRN
  Administered 2022-10-13: 35 mg via INTRAVENOUS

## 2022-10-13 MED ORDER — ACETAMINOPHEN 10 MG/ML IV SOLN
INTRAVENOUS | Status: AC
  Filled 2022-10-13: qty 100

## 2022-10-13 MED ORDER — SODIUM CHLORIDE 0.9 % IV SOLN: 250.0000 mL | INTRAVENOUS | Status: DC | PRN

## 2022-10-13 MED ORDER — ACETAMINOPHEN 325 MG PO TABS: 650.0000 mg | ORAL_TABLET | ORAL | Status: DC | PRN

## 2022-10-13 MED ORDER — COLCHICINE 0.6 MG PO TABS
0.6000 mg | ORAL_TABLET | Freq: Two times a day (BID) | ORAL | Status: DC
  Administered 2022-10-13: 0.6 mg via ORAL
  Filled 2022-10-13 (×2): qty 1

## 2022-10-13 MED ORDER — HEPARIN SODIUM (PORCINE) 1000 UNIT/ML IJ SOLN
INTRAMUSCULAR | Status: DC | PRN
  Administered 2022-10-13: 14000 [IU] via INTRAVENOUS

## 2022-10-13 MED ORDER — OMEPRAZOLE 40 MG PO CPDR: 40.0000 mg | DELAYED_RELEASE_CAPSULE | Freq: Every day | ORAL | Status: DC

## 2022-10-13 MED ORDER — PROPOFOL 10 MG/ML IV BOLUS
INTRAVENOUS | Status: DC | PRN
  Administered 2022-10-13: 160 mg via INTRAVENOUS
  Administered 2022-10-13: 40 mg via INTRAVENOUS

## 2022-10-13 MED ORDER — ONDANSETRON HCL 4 MG/2ML IJ SOLN
INTRAMUSCULAR | Status: DC | PRN
  Administered 2022-10-13: 4 mg via INTRAVENOUS

## 2022-10-13 MED ORDER — FENTANYL CITRATE (PF) 250 MCG/5ML IJ SOLN
INTRAMUSCULAR | Status: DC | PRN
  Administered 2022-10-13 (×2): 50 ug via INTRAVENOUS

## 2022-10-13 MED ORDER — ROCURONIUM BROMIDE 10 MG/ML (PF) SYRINGE
PREFILLED_SYRINGE | INTRAVENOUS | Status: DC | PRN
  Administered 2022-10-13 (×2): 50 mg via INTRAVENOUS

## 2022-10-13 MED ORDER — PANTOPRAZOLE SODIUM 40 MG PO TBEC
40.0000 mg | DELAYED_RELEASE_TABLET | Freq: Every day | ORAL | 0 refills | Status: DC
Start: 2022-10-13 — End: 2022-10-15
  Filled 2022-10-13: qty 45, 45d supply, fill #0

## 2022-10-13 MED ORDER — LIDOCAINE 2% (20 MG/ML) 5 ML SYRINGE
INTRAMUSCULAR | Status: DC | PRN
  Administered 2022-10-13: 100 mg via INTRAVENOUS

## 2022-10-13 MED ORDER — PHENYLEPHRINE HCL (PRESSORS) 10 MG/ML IV SOLN
INTRAVENOUS | Status: DC | PRN
Start: 2022-10-13 — End: 2022-10-13
  Administered 2022-10-13: 80 ug via INTRAVENOUS

## 2022-10-13 MED ORDER — FENTANYL CITRATE (PF) 100 MCG/2ML IJ SOLN
INTRAMUSCULAR | Status: AC
  Filled 2022-10-13: qty 2

## 2022-10-13 MED ORDER — ACETAMINOPHEN 10 MG/ML IV SOLN
INTRAVENOUS | Status: DC | PRN
  Administered 2022-10-13: 1000 mg via INTRAVENOUS

## 2022-10-13 MED ORDER — SUGAMMADEX SODIUM 200 MG/2ML IV SOLN
INTRAVENOUS | Status: DC | PRN
  Administered 2022-10-13: 200 mg via INTRAVENOUS

## 2022-10-13 MED ORDER — SODIUM CHLORIDE 0.9% FLUSH: 3.0000 mL | INTRAVENOUS | Status: DC | PRN

## 2022-10-13 MED ORDER — PANTOPRAZOLE SODIUM 40 MG PO TBEC
40.0000 mg | DELAYED_RELEASE_TABLET | Freq: Every day | ORAL | Status: DC
  Administered 2022-10-13: 40 mg via ORAL
  Filled 2022-10-13 (×2): qty 1

## 2022-10-13 MED ORDER — APIXABAN 5 MG PO TABS
5.0000 mg | ORAL_TABLET | Freq: Two times a day (BID) | ORAL | Status: DC
  Administered 2022-10-13: 5 mg via ORAL
  Filled 2022-10-13 (×2): qty 1

## 2022-10-13 MED ORDER — PHENYLEPHRINE HCL-NACL 20-0.9 MG/250ML-% IV SOLN
INTRAVENOUS | Status: DC | PRN
  Administered 2022-10-13: 25 ug/min via INTRAVENOUS

## 2022-10-13 SURGICAL SUPPLY — 23 items
BAG SNAP BAND KOVER 36X36 (MISCELLANEOUS) IMPLANT
CABLE PFA RX CATH CONN (CABLE) IMPLANT
CATH 8FR REPROCESSED SOUNDSTAR (CATHETERS) ×1 IMPLANT
CATH 8FR SOUNDSTAR REPROCESSED (CATHETERS) IMPLANT
CATH FARAWAVE ABLATION 31 (CATHETERS) IMPLANT
CATH OCTARAY 2.0 F 3-3-3-3-3 (CATHETERS) IMPLANT
CATH WEB BI DIR CSDF CRV REPRO (CATHETERS) IMPLANT
CLOSURE PERCLOSE PROSTYLE (VASCULAR PRODUCTS) IMPLANT
COVER SWIFTLINK CONNECTOR (BAG) ×1 IMPLANT
DILATOR VESSEL 38 20CM 16FR (INTRODUCER) IMPLANT
GUIDEWIRE INQWIRE 1.5J.035X260 (WIRE) IMPLANT
INQWIRE 1.5J .035X260CM (WIRE) ×1
PACK EP LATEX FREE (CUSTOM PROCEDURE TRAY) ×1
PACK EP LF (CUSTOM PROCEDURE TRAY) ×1 IMPLANT
PAD DEFIB RADIO PHYSIO CONN (PAD) ×1 IMPLANT
PATCH CARTO3 (PAD) IMPLANT
SHEATH FARADRIVE STEERABLE (SHEATH) IMPLANT
SHEATH INTROD W/O MIN 9FR 25CM (SHEATH) IMPLANT
SHEATH PINNACLE 8F 10CM (SHEATH) IMPLANT
SHEATH PINNACLE 9F 10CM (SHEATH) IMPLANT
SHEATH PROBE COVER 6X72 (BAG) IMPLANT
SHEATH WIRE KIT BAYLIS SL1 (KITS) IMPLANT
WIRE HI TORQ VERSACORE-J 145CM (WIRE) IMPLANT

## 2022-10-13 NOTE — Transfer of Care (Signed)
Immediate Anesthesia Transfer of Care Note  Patient: Tonya Bartlett  Procedure(s) Performed: ATRIAL FIBRILLATION ABLATION  Patient Location: PACU and Cath Lab  Anesthesia Type:General  Level of Consciousness: awake, alert , and oriented  Airway & Oxygen Therapy: Patient Spontanous Breathing  Post-op Assessment: Report given to RN and Post -op Vital signs reviewed and stable  Post vital signs: Reviewed and stable  Last Vitals:  Vitals Value Taken Time  BP 113/70 10/13/22 1250  Temp 36.3 C 10/13/22 1250  Pulse 65 10/13/22 1252  Resp 9 10/13/22 1252  SpO2 98 % 10/13/22 1252  Vitals shown include unfiled device data.  Last Pain:  Vitals:   10/13/22 1250  TempSrc: Temporal  PainSc: 0-No pain         Complications: No notable events documented.

## 2022-10-13 NOTE — Anesthesia Procedure Notes (Addendum)
Procedure Name: Intubation Date/Time: 10/13/2022 10:55 AM  Performed by: Alwyn Ren, CRNAPre-anesthesia Checklist: Patient identified, Emergency Drugs available, Suction available and Patient being monitored Patient Re-evaluated:Patient Re-evaluated prior to induction Oxygen Delivery Method: Circle system utilized Preoxygenation: Pre-oxygenation with 100% oxygen Induction Type: IV induction Ventilation: Mask ventilation without difficulty Laryngoscope Size: 3 and Glidescope (glidescope go) Grade View: Grade III Tube type: Oral Tube size: 7.0 mm Number of attempts: 2 Airway Equipment and Method: Oral airway and Rigid stylet Placement Confirmation: ETT inserted through vocal cords under direct vision, positive ETCO2 and breath sounds checked- equal and bilateral Secured at: 21 cm Tube secured with: Tape Dental Injury: Teeth and Oropharynx as per pre-operative assessment  Comments: Attempted with Hyacinth Meeker 2-anterior view

## 2022-10-13 NOTE — H&P (Signed)
Electrophysiology Office Follow up Visit Note:     Date:  10/13/2022    ID:  Tonya Bartlett, DOB 1945-09-08, MRN [id number]   PCP:  Shirline Frees, NP        [facility] Cardiologist:  Nanetta Batty, MD  [facility] Electrophysiologist:  Lanier Prude, MD      Interval History:     Tonya Bartlett is a 77 y.o. female who presents for a consult for ablation. New onset atrial fibrillation was diagnosed 01/2021. Since that time she had been maintaining sinus rhythm until recently. She saw Dr. Bjorn Pippin early 02/2022 and her metoprolol was increased.    She last saw Rudi Coco, NP 03/17/2022. She reported improvement in her Afib episodes since her metoprolol was increased. She wished to discuss pursuing ablation, so she was referred to EP. She was also interested in [place] as her Eliquis is expensive. She currently takes Eliquis 5 mg by mouth twice daily for stroke prophylaxis.   Today, she reports that most of the time she is able to feel when she is in atrial fibrillation. Generally she will feel associated palpitations and starts feeling hot. Lately she is feeling much better with the increased dose of metoprolol. However, she does still have some breakthrough episodes of atrial fibrillation.    She endorses shortness of breath after walking too much or too long. At times this may be the distance from walking from her car in the parking lot to the building.   She confirms a personal history of left-sided breast cancer s/p radiation therapy in 2023.   She denies any chest pain, or peripheral edema. No lightheadedness, headaches, syncope, orthopnea, or PND.   Presents for PVI today. Procedure reviewed.   Objective Past medical, surgical, social and family history were reviewed.   ROS:   Please see the history of present illness.    (+) Palpitations (+) Shortness of breath All other systems reviewed and are negative.   EKGs/Labs/Other Studies Reviewed:     The following  studies were reviewed today:     Physical Exam:     VS:  BP 146/82   Pulse 53   Ht 5\' 5"  (1.651 m)   Wt 213 lb (96.6 kg)   SpO2 98%   BMI 35.45 kg/m         Wt Readings from Last 3 Encounters:  06/30/22 213 lb (96.6 kg)  06/29/22 210 lb (95.3 kg)  06/17/22 208 lb (94.3 kg)      GEN:  Well nourished, well developed in no acute distress CARDIAC: RRR, no murmurs, rubs, gallops RESPIRATORY:  Clear to auscultation without rales, wheezing or rhonchi          Assessment ASSESSMENT:     1. PAF (paroxysmal atrial fibrillation) (HCC)   2. Essential hypertension, benign     PLAN:     In order of problems listed above:   #pAF Increasing burden. Symptomatic.  On Eliquis but wishes to avoid long term exposure given increased risk of bleeding with diverticulosis/colitis.    Discussed treatment options today for their AF including antiarrhythmic drug therapy and ablation. Discussed risks, recovery and likelihood of success. Discussed potential need for repeat ablation procedures and antiarrhythmic drugs after an initial ablation. They wish to proceed with scheduling.   Discussed treatment options today for AF including antiarrhythmic drug therapy and ablation. Discussed risks, recovery and likelihood of success with each treatment strategy. Risk, benefits, and alternatives to EP study and ablation for  afib were discussed. These risks include but are not limited to stroke, bleeding, vascular damage, tamponade, perforation, damage to the esophagus, lungs, phrenic nerve and other structures, pulmonary vein stenosis, worsening renal function, coronary vasospasm and death.  Discussed potential need for repeat ablation procedures and antiarrhythmic drugs after an initial ablation. The patient understands these risk and wishes to proceed.  We will therefore proceed with catheter ablation at the next available time.  Carto, ICE, anesthesia are requested for the procedure.  Will also obtain CT PV  protocol prior to the procedure to exclude LAA thrombus and further evaluate atrial anatomy.    #Hypertension At goal today.  Recommend checking blood pressures 1-2 times per week at home and recording the values.  Recommend bringing these recordings to the primary care physician.     Presents for PVI today. Procedure reviewed.   Signed, Steffanie Dunn, MD, [facility], [facility] 10/13/2022 Electrophysiology [facility]

## 2022-10-13 NOTE — Anesthesia Postprocedure Evaluation (Signed)
Anesthesia Post Note  Patient: Tonya Bartlett  Procedure(s) Performed: ATRIAL FIBRILLATION ABLATION     Patient location during evaluation: PACU Anesthesia Type: General Level of consciousness: awake and alert Pain management: pain level controlled Vital Signs Assessment: post-procedure vital signs reviewed and stable Respiratory status: spontaneous breathing, nonlabored ventilation, respiratory function stable and patient connected to nasal cannula oxygen Cardiovascular status: blood pressure returned to baseline and stable Postop Assessment: no apparent nausea or vomiting Anesthetic complications: no   No notable events documented.  Last Vitals:  Vitals:   10/13/22 1600 10/13/22 1700  BP: (!) 172/73 (!) 155/99  Pulse: 75 66  Resp: 18 (!) 29  Temp:    SpO2: 94% 96%    Last Pain:  Vitals:   10/13/22 1316  TempSrc: Temporal  PainSc:                  Trevor Iha

## 2022-10-13 NOTE — Progress Notes (Signed)
Assumed care of pt at this time.

## 2022-10-13 NOTE — Progress Notes (Signed)
Pt ambulated to and from bathroom with no signs of oozing from bilateral groin site

## 2022-10-14 ENCOUNTER — Encounter (HOSPITAL_COMMUNITY): Payer: Self-pay | Admitting: Cardiology

## 2022-10-14 MED FILL — Fentanyl Citrate Preservative Free (PF) Inj 100 MCG/2ML: INTRAMUSCULAR | Qty: 2 | Status: AC

## 2022-11-02 DIAGNOSIS — G894 Chronic pain syndrome: Secondary | ICD-10-CM | POA: Diagnosis not present

## 2022-11-02 DIAGNOSIS — L4052 Psoriatic arthritis mutilans: Secondary | ICD-10-CM | POA: Diagnosis not present

## 2022-11-02 DIAGNOSIS — Z79891 Long term (current) use of opiate analgesic: Secondary | ICD-10-CM | POA: Diagnosis not present

## 2022-11-02 DIAGNOSIS — M47812 Spondylosis without myelopathy or radiculopathy, cervical region: Secondary | ICD-10-CM | POA: Diagnosis not present

## 2022-11-02 DIAGNOSIS — M150 Primary generalized (osteo)arthritis: Secondary | ICD-10-CM | POA: Diagnosis not present

## 2022-11-06 DIAGNOSIS — N3020 Other chronic cystitis without hematuria: Secondary | ICD-10-CM | POA: Diagnosis not present

## 2022-11-06 DIAGNOSIS — R300 Dysuria: Secondary | ICD-10-CM | POA: Diagnosis not present

## 2022-11-09 ENCOUNTER — Ambulatory Visit: Payer: Medicare Other

## 2022-11-17 ENCOUNTER — Ambulatory Visit (INDEPENDENT_AMBULATORY_CARE_PROVIDER_SITE_OTHER): Payer: Medicare Other

## 2022-11-17 DIAGNOSIS — Z23 Encounter for immunization: Secondary | ICD-10-CM

## 2022-11-19 ENCOUNTER — Other Ambulatory Visit: Payer: Self-pay | Admitting: Neurology

## 2022-11-25 ENCOUNTER — Encounter: Payer: Self-pay | Admitting: Cardiovascular Disease

## 2022-11-25 ENCOUNTER — Ambulatory Visit: Payer: Medicare Other | Attending: Cardiovascular Disease | Admitting: Cardiovascular Disease

## 2022-11-25 VITALS — BP 142/82 | HR 57 | Ht 66.0 in | Wt 226.0 lb

## 2022-11-25 DIAGNOSIS — I10 Essential (primary) hypertension: Secondary | ICD-10-CM | POA: Insufficient documentation

## 2022-11-25 DIAGNOSIS — E782 Mixed hyperlipidemia: Secondary | ICD-10-CM | POA: Insufficient documentation

## 2022-11-25 DIAGNOSIS — I480 Paroxysmal atrial fibrillation: Secondary | ICD-10-CM | POA: Diagnosis not present

## 2022-11-25 NOTE — Assessment & Plan Note (Signed)
History of essential hypertension blood pressure measured today at 164/82.  She is on losartan and metoprolol.

## 2022-11-25 NOTE — Patient Instructions (Signed)
Medication Instructions:  Your physician recommends that you continue on your current medications as directed. Please refer to the Current Medication list given to you today.  *If you need a refill on your cardiac medications before your next appointment, please call your pharmacy*   Follow-Up: At [facility], you and your health needs are our priority.  As part of our continuing mission to provide you with exceptional heart care, we have created designated Provider Care Teams.  These Care Teams include your primary Cardiologist (physician) and Advanced Practice Providers (APPs -  Physician Assistants and Nurse Practitioners) who all work together to provide you with the care you need, when you need it.  We recommend signing up for the patient portal called "MyChart".  Sign up information is provided on this After Visit Summary.  MyChart is used to connect with patients for Virtual Visits (Telemedicine).  Patients are able to view lab/test results, encounter notes, upcoming appointments, etc.  Non-urgent messages can be sent to your provider as well.   To learn more about what you can do with MyChart, go to [email or web address].    Your next appointment:   12 month(s)  Provider:   Jonathan Berry, MD    

## 2022-11-25 NOTE — Progress Notes (Signed)
See back this week or okay     11/25/2022 Ronada Cristiano Korber   12-05-1945  [id number]  Primary Physician Evelene Croon, Kandee Keen, NP Primary Cardiologist: Runell Gess MD Nicholes Calamity, [place]  HPI:  Tonya Bartlett is a 77 y.o.    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  She was referred by Dr. Adela Lank, her gastroenterologist, for evaluation of recently recognized PAF.  I last saw her in the office 08/07/2021.  Her cardiac risk factors are positive for treated hypertension and family history.  Father died of a myocardial infarction at age 80.  She is never had a heart attack or stroke.  She denies chest pain or shortness of breath.  She has had multiple joint replacements including 3 total knee replacements, 2 total hip replacements and 2 shoulder surgeries.  She recently broke her ankle and had orthopedic surgery on this 10 weeks ago.  She is noticed palpitations over the last 6 to 12 months occurring for minutes at a time, usually when she is quiet and resting.   Since I saw her in the office a little over a year ago she continues to do well.  She does water aerobics without limitation.  She had an A-fib ablation by Dr. Lalla Brothers 10/13/2022.  She continues to have episodes of A-fib since that time.  She is on Eliquis oral anticoagulation.  She was interested in having a "Watchman device" because of her diverticulosis but unfortunately she is allergic to nickel which is a component of this.   Current Meds  Medication Sig   acetaminophen (TYLENOL) 325 MG tablet Take 650 mg by mouth every 6 (six) hours as needed for moderate pain.   Ascorbic Acid (VITAMIN C) 1000 MG tablet Take 1,000 mg by mouth daily.   atorvastatin (LIPITOR) 40 MG tablet TAKE 1 TABLET BY MOUTH EVERY DAY   betamethasone dipropionate 0.05 % lotion APPLY TOPICALLY TWICE A DAY   betamethasone, augmented, (DIPROLENE) 0.05 % lotion Apply  topically 2 (two) times daily.   ELIQUIS 5 MG TABS tablet TAKE 1 TABLET BY MOUTH TWICE A DAY   HYDROcodone-acetaminophen (NORCO/VICODIN) 5-325 MG tablet Take 2 tablets by mouth every 4 (four) hours as needed for moderate pain or severe pain.   hydrocortisone cream 1 % Apply 1 application topically daily as needed for itching.   L-Methylfolate-B12-B6-B2 (CEREFOLIN) 07-17-48-5 MG TABS TAKE 1 TABLET BY MOUTH DAILY   losartan (COZAAR) 100 MG tablet Take 1 tablet (100 mg total) by mouth daily.   metFORMIN (GLUCOPHAGE) 500 MG tablet Take 1 tablet (500 mg total) by mouth 2 (two) times daily with a meal.   metoprolol tartrate (LOPRESSOR) 25 MG tablet TAKE 0.5 TABLETS BY MOUTH 2 TIMES DAILY. (Patient taking differently: Take 25 mg by mouth 2 (two) times daily.)   omeprazole (PRILOSEC) 40 MG capsule TAKE 1 CAPSULE BY MOUTH 2 (TWO) TIMES DAILY. TAKE 30 MINUTES BEFORE BREAKFAST & DINNER.   Soft Lens Products (REWETTING DROPS) SOLN Place 1 drop into both eyes daily as needed (Dry eyes).   sulfamethoxazole-trimethoprim (BACTRIM,SEPTRA) 400-80 MG tablet Take 1 tablet by mouth at bedtime. For urethritis.   tamoxifen (NOLVADEX) 10 MG tablet Take 1/2 tablet by mouth in the evening   traZODone (DESYREL) 50 MG tablet TAKE 1 TABLET BY MOUTH EVERYDAY AT BEDTIME   venlafaxine XR (EFFEXOR-XR) 75 MG 24 hr capsule Take 1 capsule (75 mg total) by mouth daily.  Vitamin D, Ergocalciferol, (DRISDOL) 1.25 MG (50000 UNIT) CAPS capsule Take 1 capsule (50,000 Units total) by mouth every 7 (seven) days.   vitamin E 180 MG (400 UNITS) capsule Take 400 Units by mouth daily.     Allergies  Allergen Reactions   Nsaids Other (See Comments) and Anaphylaxis    SEVERE STOMACH CRAMPS, MOUTH SORES  **Able to tolerate Tylenol   Otezla [Apremilast] Anaphylaxis    Suicidal ideation   Penicillins Anaphylaxis and Other (See Comments)    Has patient had a PCN reaction causing immediate rash, facial/tongue/throat swelling, SOB or  lightheadedness with hypotension:  Yes  Has patient had a PCN reaction causing severe rash involving mucus membranes or skin necrosis: Yes  Has patient had a PCN reaction that required hospitalization: No  Has patient had a PCN reaction occurring within the last 10 year No  If all of the above answers are "NO", then may proceed with Cephalosporin use.   Erythromycin Other (See Comments)    SEVERE STOMACH CRAMPS   Morphine And Codeine Nausea And Vomiting   Penicillamine Other (See Comments)   Remicade [Infliximab] Other (See Comments)    Shut down immune system-BP high   Risankizumab Other (See Comments)    URI, fever, cough, UTI Skyrizi   Tolmetin     SEVERE STOMACH CRAMPS   Nickel Rash    Including snaps on hospital gowns     Social History   Socioeconomic History   Marital status: Widowed    Spouse name: Not on file   Number of children: 3   Years of education: Not on file   Highest education level: Master's degree (e.g., MA, MS, MEng, MEd, MSW, MBA)  Occupational History   Occupation: retired Paramedic  Tobacco Use   Smoking status: Former    Current packs/day: 0.00    Average packs/day: 0.5 packs/day for 15.0 years (7.5 ttl pk-yrs)    Types: Cigarettes    Start date: 05/16/1970    Quit date: 05/15/1985    Years since quitting: 37.5   Smokeless tobacco: Never  Vaping Use   Vaping status: Never Used  Substance and Sexual Activity   Alcohol use: No    Comment: RECOVERING ALCOHOLIC--  QUIT [id number]   Drug use: No   Sexual activity: Not Currently    Birth control/protection: Post-menopausal  Other Topics Concern   Not on file  Social History Narrative   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    Widowed    Three kids    6 grandchildren       Social Determinants of Health   Financial Resource Strain: Low Risk  (04/18/2021)   Overall Financial Resource Strain (CARDIA)    Difficulty of Paying Living Expenses: Not hard  at all  Food Insecurity: Low Risk  (09/04/2022)   Received from [facility]   Hunger Vital Sign    Worried About Running Out of Food in the Last Year: Never true    Ran Out of Food in the Last Year: Never true  Transportation Needs: Not on file (09/04/2022)  Physical Activity: Inactive (04/18/2021)   Exercise Vital Sign    Days of Exercise per Week: 0 days    Minutes of Exercise per Session: 0 min  Stress: No Stress Concern Present (04/18/2021)   [employer] of Occupational Health - Occupational Stress Questionnaire    Feeling of Stress : Not at all  Social Connections: Moderately Integrated (04/18/2021)   Social  Connection and Isolation Panel [NHANES]    Frequency of Communication with Friends and Family: More than three times a week    Frequency of Social Gatherings with Friends and Family: More than three times a week    Attends Religious Services: More than 4 times per year    Active Member of [employer] or Organizations: Yes    Attends Banker Meetings: More than 4 times per year    Marital Status: Widowed  Intimate Partner Violence: Not At Risk (04/18/2021)   Humiliation, Afraid, Rape, and Kick questionnaire    Fear of Current or Ex-Partner: No    Emotionally Abused: No    Physically Abused: No    Sexually Abused: No     Review of Systems: General: negative for chills, fever, night sweats or weight changes.  Cardiovascular: negative for chest pain, dyspnea on exertion, edema, orthopnea, palpitations, paroxysmal nocturnal dyspnea or shortness of breath Dermatological: negative for rash Respiratory: negative for cough or wheezing Urologic: negative for hematuria Abdominal: negative for nausea, vomiting, diarrhea, bright red blood per rectum, melena, or hematemesis Neurologic: negative for visual changes, syncope, or dizziness All other systems reviewed and are otherwise negative except as noted above.    Blood pressure (!) 164/82, pulse (!) 57, height 5\' 6"  (1.676  m), weight 226 lb (102.5 kg), SpO2 96%.  General appearance: alert and no distress Neck: no adenopathy, no carotid bruit, no JVD, supple, symmetrical, trachea midline, and thyroid not enlarged, symmetric, no tenderness/mass/nodules Lungs: clear to auscultation bilaterally Heart: regular rate and rhythm, S1, S2 normal, no murmur, click, rub or gallop Extremities: extremities normal, atraumatic, no cyanosis or edema Pulses: 2+ and symmetric Skin: Skin color, texture, turgor normal. No rashes or lesions Neurologic: Grossly normal  EKG not performed today      ASSESSMENT AND PLAN:   Hyperlipidemia History of hyperlipidemia on statin therapy with lipid profile performed 01/29/2022 revealing total cholesterol 147, LDL 70 and HDL of 61.  Essential hypertension, benign History of essential hypertension blood pressure measured today at 164/82.  She is on losartan and metoprolol.  PAF (paroxysmal atrial fibrillation) (HCC) History of PAF status post A-fib ablation by Dr. Lalla Brothers 10/13/2022.  She is on Eliquis oral anticoagulation.  Unfortunately she was unable to get a Watchman device because she is allergic to "nickel".  She does say that she has had frequent episodes of A-fib since her ablation and is scheduled to see Dr. Lalla Brothers in the near future to discuss.     Runell Gess MD FACP,FACC,FAHA, [facility] 11/25/2022 11:44 AM

## 2022-11-25 NOTE — Assessment & Plan Note (Signed)
History of hyperlipidemia on statin therapy with lipid profile performed 01/29/2022 revealing total cholesterol 147, LDL 70 and HDL of 61.

## 2022-11-25 NOTE — Assessment & Plan Note (Signed)
History of PAF status post A-fib ablation by Dr. Lalla Brothers 10/13/2022.  She is on Eliquis oral anticoagulation.  Unfortunately she was unable to get a Watchman device because she is allergic to "nickel".  She does say that she has had frequent episodes of A-fib since her ablation and is scheduled to see Dr. Lalla Brothers in the near future to discuss.

## 2022-12-10 ENCOUNTER — Encounter (HOSPITAL_COMMUNITY): Payer: Self-pay | Admitting: Physician Assistant

## 2022-12-10 ENCOUNTER — Ambulatory Visit (HOSPITAL_COMMUNITY)
Admission: RE | Admit: 2022-12-10 | Discharge: 2022-12-10 | Disposition: A | Discharge status: Home / Self Care | Payer: Medicare Other | Source: Ambulatory Visit | Attending: Physician Assistant | Admitting: Physician Assistant

## 2022-12-10 ENCOUNTER — Telehealth: Payer: Self-pay | Admitting: Cardiovascular Disease

## 2022-12-10 VITALS — BP 148/88 | HR 58 | Ht 66.0 in | Wt 228.0 lb

## 2022-12-10 DIAGNOSIS — Z853 Personal history of malignant neoplasm of breast: Secondary | ICD-10-CM | POA: Insufficient documentation

## 2022-12-10 DIAGNOSIS — E785 Hyperlipidemia, unspecified: Secondary | ICD-10-CM | POA: Insufficient documentation

## 2022-12-10 DIAGNOSIS — I480 Paroxysmal atrial fibrillation: Secondary | ICD-10-CM | POA: Insufficient documentation

## 2022-12-10 DIAGNOSIS — Z7901 Long term (current) use of anticoagulants: Secondary | ICD-10-CM | POA: Insufficient documentation

## 2022-12-10 DIAGNOSIS — I4891 Unspecified atrial fibrillation: Secondary | ICD-10-CM | POA: Diagnosis present

## 2022-12-10 DIAGNOSIS — D6869 Other thrombophilia: Secondary | ICD-10-CM | POA: Insufficient documentation

## 2022-12-10 DIAGNOSIS — I10 Essential (primary) hypertension: Secondary | ICD-10-CM | POA: Diagnosis not present

## 2022-12-10 NOTE — Telephone Encounter (Signed)
You can give 2 weeks of samples, but we aren't able to sample long term. Its very important that she does not miss any doses for the next month especially since she is post ablation.

## 2022-12-10 NOTE — Progress Notes (Signed)
Primary Care Physician: Shirline Frees, NP Primary Cardiologist: Nanetta Batty, MD Electrophysiologist: Lanier Prude, MD  Referring Physician: Dr Con Memos Tonya Bartlett is a 77 y.o. female with a history of breast cancer, HLD, HTN, atrial fibrillation who presents for follow up in the [facility].  New onset atrial fibrillation was diagnosed 01/2021. Patient is on Eliquis for a CHADS2VASC score of 5. She was seen by Dr Lalla Brothers 06/2022 to discuss ablation and Watchman implant. She is s/p afib ablation 10/13/22  On follow up today, patient reports that she has done well since her ablation. She has occasionally noted a higher heart rate on her smart watch but no definite afib. She denies any groin issues or chest pain. No bleeding issues on anticoagulation.   Today, she denies symptoms of palpitations, chest pain, shortness of breath, orthopnea, PND, lower extremity edema, dizziness, presyncope, syncope, snoring, daytime somnolence, bleeding, or neurologic sequela. The patient is tolerating medications without difficulties and is otherwise without complaint today.    Atrial Fibrillation Risk Factors:  she does not have symptoms or diagnosis of sleep apnea. she does not have a history of rheumatic fever. she does have a history of alcohol use.   Atrial Fibrillation Management history:  Previous antiarrhythmic drugs: none Previous cardioversions: none Previous ablations: 10/13/22 Anticoagulation history: Eliquis  ROS- All systems are reviewed and negative except as per the HPI above.  Past Medical History:  Diagnosis Date   Alcohol abuse    Sober 41 years.   Allergic rhinitis    Allergy    Anemia    Anxiety    Blood transfusion without reported diagnosis    Breast cancer (HCC) 12/2020   left   Cancer (HCC)    Cataract    bil cataracts removed   Clotting disorder (HCC)    DVT- 1996 po knee replacement   Colitis    Complication of anesthesia     vomit x1 while on Morphine per pt   Depression    Diverticulosis    Dysrhythmia    Atrial fibrillation   Fatigue    Frequency of urination    GERD (gastroesophageal reflux disease)    History of colon polyps    History of DVT of lower extremity    POST LEFT TOTAL KNEE  1996   History of hiatal hernia    History of iron deficiency anemia 1996   iron infusion   History of migraine headaches    History of radiation therapy    left breast 03/27/2021-04/23/2021 Dr Antony Blackbird   History of rib fracture    Hyperlipidemia    Hypertension    IBS (irritable bowel syndrome)    Insomnia    Joint pain    MCI (mild cognitive impairment)    Melanoma (HCC) 2019   left leg    Migraine headache    hx of migraines    OA (osteoarthritis)    RIGHT SHOULDER   Osteopenia    Pneumonia    remote history   PONV (postoperative nausea and vomiting)    Recovering alcoholic (HCC)    SINCE 01-21-1980   Right rotator cuff tear    SOB (shortness of breath) on exertion    Substance abuse (HCC)    recovering alcoholic   Swallowing difficulty    Unspecified essential hypertension     Current Outpatient Medications  Medication Sig Dispense Refill   acetaminophen (TYLENOL) 325 MG tablet Take 650 mg by mouth  every 6 (six) hours as needed for moderate pain.     Ascorbic Acid (VITAMIN C) 1000 MG tablet Take 1,000 mg by mouth daily.     atorvastatin (LIPITOR) 40 MG tablet TAKE 1 TABLET BY MOUTH EVERY DAY 90 tablet 3   betamethasone dipropionate 0.05 % lotion APPLY TOPICALLY TWICE A DAY 60 mL 0   betamethasone, augmented, (DIPROLENE) 0.05 % lotion Apply topically 2 (two) times daily. 60 mL 0   colchicine 0.6 MG tablet Take 1 tablet (0.6 mg total) by mouth 2 (two) times daily for 5 days. 10 tablet 0   ELIQUIS 5 MG TABS tablet TAKE 1 TABLET BY MOUTH TWICE A DAY 60 tablet 5   HYDROcodone-acetaminophen (NORCO/VICODIN) 5-325 MG tablet Take 2 tablets by mouth every 4 (four) hours as needed for moderate pain or  severe pain.     hydrocortisone cream 1 % Apply 1 application topically daily as needed for itching.     L-Methylfolate-B12-B6-B2 (CEREFOLIN) 07-17-48-5 MG TABS TAKE 1 TABLET BY MOUTH DAILY 90 tablet 0   losartan (COZAAR) 100 MG tablet Take 1 tablet (100 mg total) by mouth daily. 90 tablet 3   metFORMIN (GLUCOPHAGE) 500 MG tablet Take 1 tablet (500 mg total) by mouth 2 (two) times daily with a meal. 60 tablet 0   metoprolol tartrate (LOPRESSOR) 25 MG tablet TAKE 0.5 TABLETS BY MOUTH 2 TIMES DAILY. (Patient taking differently: Take 25 mg by mouth 2 (two) times daily.) 90 tablet 1   omeprazole (PRILOSEC) 40 MG capsule TAKE 1 CAPSULE BY MOUTH 2 (TWO) TIMES DAILY. TAKE 30 MINUTES BEFORE BREAKFAST & DINNER. 180 capsule 1   Soft Lens Products (REWETTING DROPS) SOLN Place 1 drop into both eyes daily as needed (Dry eyes).     sulfamethoxazole-trimethoprim (BACTRIM,SEPTRA) 400-80 MG tablet Take 1 tablet by mouth at bedtime. For urethritis.     tamoxifen (NOLVADEX) 10 MG tablet Take 1/2 tablet by mouth in the evening     traZODone (DESYREL) 50 MG tablet TAKE 1 TABLET BY MOUTH EVERYDAY AT BEDTIME 90 tablet 1   venlafaxine XR (EFFEXOR-XR) 75 MG 24 hr capsule Take 1 capsule (75 mg total) by mouth daily. 90 capsule 0   Vitamin D, Ergocalciferol, (DRISDOL) 1.25 MG (50000 UNIT) CAPS capsule Take 1 capsule (50,000 Units total) by mouth every 7 (seven) days. 4 capsule 0   vitamin E 180 MG (400 UNITS) capsule Take 400 Units by mouth daily.     No current facility-administered medications for this visit.    Physical Exam: There were no vitals taken for this visit.  GEN: Well nourished, well developed in no acute distress NECK: No JVD; No carotid bruits CARDIAC: Regular rate and rhythm, no murmurs, rubs, gallops RESPIRATORY:  Clear to auscultation without rales, wheezing or rhonchi  ABDOMEN: Soft, non-tender, non-distended EXTREMITIES:  No edema; No deformity   Wt Readings from Last 3 Encounters:  11/25/22  102.5 kg  10/13/22 96.2 kg  07/24/22 95.3 kg     EKG today demonstrates  SB, PAC Vent. rate 58 BPM PR interval 168 ms QRS duration 92 ms QT/QTcB 416/408 ms  Echo 12/06/20 demonstrated   1. Left ventricular ejection fraction by 3D volume is 73 %. The left  ventricle has hyperdynamic function. The left ventricle has no regional  wall motion abnormalities. Left ventricular diastolic parameters are  consistent with Grade I diastolic dysfunction (impaired relaxation). The average left ventricular global longitudinal strain is -25.1 %. The global longitudinal strain is normal.  2. Right ventricular systolic function is normal. The right ventricular  size is normal.   3. Left atrial size was mildly dilated.   4. The mitral valve is normal in structure. Mild mitral valve  regurgitation. No evidence of mitral stenosis.   5. The aortic valve is tricuspid. There is mild calcification of the  aortic valve. There is mild thickening of the aortic valve. Aortic valve  regurgitation is not visualized. Mild to moderate aortic valve  sclerosis/calcification is present, without any  evidence of aortic stenosis.   6. The inferior vena cava is normal in size with greater than 50%  respiratory variability, suggesting right atrial pressure of 3 mmHg.    CHA2DS2-VASc Score = 5  The patient's score is based upon: CHF History: 0 HTN History: 1 Diabetes History: 0 Stroke History: 0 Vascular Disease History: 1 Age Score: 2 Gender Score: 1       ASSESSMENT AND PLAN: Paroxysmal Atrial Fibrillation (ICD10:  I48.0) The patient's CHA2DS2-VASc score is 5, indicating a 7.2% annual risk of stroke.   S/p afib ablation 10/13/22 Patient in SR today. We discussed that it is not unusual to have some breakthrough afib episodes soon after an ablation.  Continue Eliquis 5 mg BID with no missed doses for 3 months post ablation.  Continue Lopressor 25 mg BID Apple Watch for home monitoring  Secondary  Hypercoagulable State (ICD10:  D68.69) The patient is at significant risk for stroke/thromboembolism based upon her CHA2DS2-VASc Score of 5.  Continue Apixaban (Eliquis). Not a candidate for Watchman with a nickel allergy.   HTN Stable on current regimen    Follow up with Dr Lalla Brothers as scheduled.       Jorja Loa PA-C [facility] [address] [place], [place] [address] [phone]

## 2022-12-10 NOTE — Telephone Encounter (Signed)
Please advise 

## 2022-12-10 NOTE — Telephone Encounter (Signed)
Patient calling the office for samples of medication:   1.  What medication and dosage are you requesting samples for?  ELIQUIS 5 MG TABS tablet   2.  Are you currently out of this medication?   Patient stated she is in the donut hole and this medication is very expensive and she wants to get samples.  Patient stated she has about 3 days left of this medication.

## 2022-12-10 NOTE — Telephone Encounter (Signed)
Spoke with patient to inform of below message and samples place up front for pick up.

## 2022-12-11 ENCOUNTER — Encounter (HOSPITAL_COMMUNITY): Payer: Self-pay | Admitting: Psychiatry

## 2022-12-11 ENCOUNTER — Telehealth (INDEPENDENT_AMBULATORY_CARE_PROVIDER_SITE_OTHER): Payer: Medicare Other | Admitting: Psychiatry

## 2022-12-11 DIAGNOSIS — F1021 Alcohol dependence, in remission: Secondary | ICD-10-CM

## 2022-12-11 DIAGNOSIS — F5102 Adjustment insomnia: Secondary | ICD-10-CM

## 2022-12-11 DIAGNOSIS — F331 Major depressive disorder, recurrent, moderate: Secondary | ICD-10-CM | POA: Diagnosis not present

## 2022-12-11 NOTE — Progress Notes (Signed)
[facility] Outpatient visit Tele psych  Patient Identification: ZEHRA RAJPUT MRN:  [id number] Date of Evaluation:  12/11/2022 Referral Source: NP Chief Complaint:   Depression follow up  Visit Diagnosis:    ICD-10-CM   1. Major depressive disorder, recurrent episode, moderate (HCC)  F33.1     2. Adjustment insomnia  F51.02     3. Alcohol dependence in remission [facility])  F10.21     Virtual Visit via Video Note  I connected with Devony Monaco Kuenzi on 12/11/22 at 10:30 AM EDT by a video enabled telemedicine application and verified that I am speaking with the correct person using two identifiers.  Location: Patient: home Provider: home office   I discussed the limitations of evaluation and management by telemedicine and the availability of in person appointments. The patient expressed understanding and agreed to proceed.      I discussed the assessment and treatment plan with the patient. The patient was provided an opportunity to ask questions and all were answered. The patient agreed with the plan and demonstrated an understanding of the instructions.   The patient was advised to call back or seek an in-person evaluation if the symptoms worsen or if the condition fails to improve as anticipated.  I provided 18 minutes of non-face-to-face time during this encounter.      History of Present Illness: 77  years old currently widowed white female lives by herself has 3 grown kids and 6 grandkids referred initially by family medicine for management of depression  Remains sober 40 plus years, attends and chair AA groups  Has had breast surgery daignosed with cancer, recovering, on radiation.   On tomoxifen Effexor has been helping, depression is stable Has gained weight last month will discuss with PCP we reivewed meds but considering her stability will hold off from tapering down any including trazadone which helps with sleep  Remains sober   Modifying factors :family,  daughter is  now communicating aggravating factors. Hip surgery,  breast surgery, medical conditions Severity stable   Past Psychiatric History: depression  Previous Psychotropic Medications: Yes   Substance Abuse History in the last 12 months:  No.  Consequences of Substance Abuse: NA  Past Medical History:  Past Medical History:  Diagnosis Date   Alcohol abuse    Sober 41 years.   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  1996   History of hiatal hernia    History of iron deficiency anemia 1996   iron infusion   History of migraine headaches    History of radiation therapy    left breast 03/27/2021-04/23/2021 Dr Antony Blackbird   History of rib fracture    Hyperlipidemia    Hypertension    IBS (irritable bowel syndrome)    Insomnia  Joint pain    MCI (mild cognitive impairment)    Melanoma (HCC) 2019   left leg    Migraine headache    hx of migraines    OA (osteoarthritis)    RIGHT SHOULDER   Osteopenia    Pneumonia    remote history   PONV (postoperative nausea and vomiting)    Recovering alcoholic (HCC)    SINCE 01-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   Right rotator cuff tear    SOB (shortness of breath) on exertion    Substance abuse (HCC)    recovering alcoholic   Swallowing difficulty    Unspecified essential hypertension     Past Surgical History:  Procedure Laterality Date   ATRIAL FIBRILLATION ABLATION N/A 10/13/2022   Procedure: ATRIAL FIBRILLATION ABLATION;   Surgeon: Lanier Prude, MD;  Location: [facility] INV[facility]IVE [facility];  Service: Cardiovascular;  Laterality: N/A;   BREAST BIOPSY Left 01/06/2021   pos   BREAST LUMPECTOMY WITH RADIOACTIVE SEED LOCALIZATION Left 02/27/2021   Procedure: LEFT BREAST LUMPECTOMY WITH RADIOACTIVE SEED LOCALIZATION;  Surgeon: Almond Lint, MD;  Location: [facility] OR;  Service: General;  Laterality: Left;   BUNIONECTOMY/  HAMMERTOE CORRECTION  RIGHT FOOT  2011   CATARACT EXTRACTION W/ INTRAOCULAR LENS  IMPLANT, BILATERAL     COLONOSCOPY     DILATION AND CURETTAGE OF UTERUS  1976   EYE SURGERY Bilateral 2016   cataract removal   KNEE ARTHROSCOPY W/ MENISCECTOMY Bilateral X2  LEFT /    X1  RIGHT   KNEE OPEN LATERAL RELEASE Bilateral    MOHS SURGERY Left 12/15/2017   Melanoma in situ - left calf - [facility]   ORIF ANKLE FRACTURE Right 08/04/2020   Procedure: OPEN REDUCTION INTERNAL FIXATION (ORIF) ANKLE FRACTURE;  Surgeon: Toni Arthurs, MD;  Location: [facility] ORS;  Service: Orthopedics;  Laterality: Right;  Mini C-arm, [employer] Small Frag   REPLACEMENT TOTAL KNEE Left 2006   SHOULDER ARTHROSCOPY WITH SUBACROMIAL DECOMPRESSION, ROTATOR CUFF REPAIR AND BICEP TENDON REPAIR Right 05/23/2013   Procedure: RIGHT SHOULDER ARTHROSCOPY EXAM UNDER ANESTHESIA  WITH SUBACROMIAL DECOMPRESSION,DISTAL CLAVICLE RESECTION, SADLABRAL DEBRIDEMENT CHONDROPLASTY, BICEP TENOTOMY ;  Surgeon: Eugenia Mcalpine, MD;  Location: [facility];  Service: Orthopedics;  Laterality: Right;   TONSILLECTOMY AND ADENOIDECTOMY  AGE 60   TOTAL HIP ARTHROPLASTY Left 05/04/2017   Procedure: LEFT TOTAL HIP ARTHROPLASTY ANTERIOR APPROACH;  Surgeon: Durene Romans, MD;  Location: [facility];  Service: Orthopedics;  Laterality: Left;   TOTAL HIP ARTHROPLASTY Right 08/01/2019   Procedure: TOTAL HIP ARTHROPLASTY ANTERIOR APPROACH;  Surgeon: Durene Romans, MD;  Location: [facility];  Service: Orthopedics;  Laterality: Right;    TOTAL KNEE ARTHROPLASTY  Bilateral LEFT  1996/   RIGHT 2004   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      UPPER GASTROINTESTINAL ENDOSCOPY     UPPER GI ENDOSCOPY     VAGINAL HYSTERECTOMY  1976    Family Psychiatric History: mom possible depression  Family History:  Family History  Problem Relation Age of Onset   Cancer Mother    Heart disease Mother    Hypertension Mother    Melanoma Mother 40   Obesity Mother    Heart disease Father 28   Hypertension Father    Esophageal cancer Brother    Dementia Paternal Grandfather    Melanoma Niece 61   Colon cancer Neg Hx    Colon polyps Neg Hx    Stomach cancer Neg Hx    Rectal cancer Neg Hx  Social History:   Social History   Socioeconomic History   Marital status: Widowed    Spouse name: Not on file   Number of children: 3   Years of education: Not on file   Highest education level: Master's degree (e.g., MA, MS, MEng, MEd, MSW, MBA)  Occupational History   Occupation: retired Paramedic  Tobacco Use   Smoking status: Former    Current packs/day: 0.00    Average packs/day: 0.5 packs/day for 15.0 years (7.5 ttl pk-yrs)    Types: Cigarettes    Start date: 05/16/1970    Quit date: 05/15/1985    Years since quitting: 37.6   Smokeless tobacco: Never   Tobacco comments:    Former smoker 12/10/22  Vaping Use   Vaping status: Never Used  Substance and Sexual Activity   Alcohol use: No    Comment: RECOVERING ALCOHOLIC--  QUIT [id number]   Drug use: No   Sexual activity: Not Currently    Birth control/protection: Post-menopausal  Other Topics Concern   Not on file  Social History Narrative   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    Widowed    Three kids    6 grandchildren       Social Determinants of Health   Financial Resource Strain: Low Risk  (04/18/2021)   Overall Financial Resource Strain (CARDIA)    Difficulty of Paying Living Expenses: Not hard at all  Food Insecurity: Low Risk  (09/04/2022)    Received from [facility]   Hunger Vital Sign    Worried About Running Out of Food in the Last Year: Never true    Ran Out of Food in the Last Year: Never true  Transportation Needs: Not on file (09/04/2022)  Physical Activity: Inactive (04/18/2021)   Exercise Vital Sign    Days of Exercise per Week: 0 days    Minutes of Exercise per Session: 0 min  Stress: No Stress Concern Present (04/18/2021)   [employer] of Occupational Health - Occupational Stress Questionnaire    Feeling of Stress : Not at all  Social Connections: Moderately Integrated (04/18/2021)   Social Connection and Isolation Panel [NHANES]    Frequency of Communication with Friends and Family: More than three times a week    Frequency of Social Gatherings with Friends and Family: More than three times a week    Attends Religious Services: More than 4 times per year    Active Member of [employer] or Organizations: Yes    Attends Banker Meetings: More than 4 times per year    Marital Status: Widowed     Allergies:   Allergies  Allergen Reactions   Nsaids Other (See Comments) and Anaphylaxis    SEVERE STOMACH CRAMPS, MOUTH SORES  **Able to tolerate Tylenol   Otezla [Apremilast] Anaphylaxis    Suicidal ideation   Penicillins Anaphylaxis and Other (See Comments)    Has patient had a PCN reaction causing immediate rash, facial/tongue/throat swelling, SOB or lightheadedness with hypotension:  Yes  Has patient had a PCN reaction causing severe rash involving mucus membranes or skin necrosis: Yes  Has patient had a PCN reaction that required hospitalization: No  Has patient had a PCN reaction occurring within the last 10 year No  If all of the above answers are "NO", then may proceed with Cephalosporin use.   Erythromycin Other (See Comments)    SEVERE STOMACH CRAMPS   Morphine And Codeine Nausea And Vomiting  Penicillamine Other (See Comments)   Remicade [Infliximab] Other (See Comments)    Shut down  immune system-BP high   Risankizumab Other (See Comments)    URI, fever, cough, UTI Skyrizi   Tolmetin     SEVERE STOMACH CRAMPS   Nickel Rash    Including snaps on hospital gowns     Metabolic Disorder Labs: Lab Results  Component Value Date   HGBA1C 5.3 01/29/2022   No results found for: "PROLACTIN" Lab Results  Component Value Date   CHOL 147 01/29/2022   TRIG 84 01/29/2022   HDL 61 01/29/2022   CHOLHDL 2.7 06/11/2021   VLDL 09.8 04/24/2021   LDLCALC 70 01/29/2022   LDLCALC 90 06/11/2021     Current Medications: Current Outpatient Medications  Medication Sig Dispense Refill   acetaminophen (TYLENOL) 325 MG tablet Take 650 mg by mouth every 6 (six) hours as needed for moderate pain.     Ascorbic Acid (VITAMIN C) 1000 MG tablet Take 1,000 mg by mouth daily.     atorvastatin (LIPITOR) 40 MG tablet TAKE 1 TABLET BY MOUTH EVERY DAY 90 tablet 3   betamethasone dipropionate 0.05 % lotion APPLY TOPICALLY TWICE A DAY 60 mL 0   betamethasone, augmented, (DIPROLENE) 0.05 % lotion Apply topically 2 (two) times daily. 60 mL 0   colchicine 0.6 MG tablet Take 1 tablet (0.6 mg total) by mouth 2 (two) times daily for 5 days. 10 tablet 0   ELIQUIS 5 MG TABS tablet TAKE 1 TABLET BY MOUTH TWICE A DAY 60 tablet 5   HYDROcodone-acetaminophen (NORCO/VICODIN) 5-325 MG tablet Take 2 tablets by mouth every 4 (four) hours as needed for moderate pain or severe pain.     hydrocortisone cream 1 % Apply 1 application topically daily as needed for itching.     L-Methylfolate-B12-B6-B2 (CEREFOLIN) 07-17-48-5 MG TABS TAKE 1 TABLET BY MOUTH DAILY 90 tablet 0   losartan (COZAAR) 100 MG tablet Take 1 tablet (100 mg total) by mouth daily. 90 tablet 3   metFORMIN (GLUCOPHAGE) 500 MG tablet Take 1 tablet (500 mg total) by mouth 2 (two) times daily with a meal. 60 tablet 0   metoprolol tartrate (LOPRESSOR) 25 MG tablet TAKE 0.5 TABLETS BY MOUTH 2 TIMES DAILY. (Patient taking differently: Take 25 mg by mouth 2  (two) times daily.) 90 tablet 1   omeprazole (PRILOSEC) 40 MG capsule TAKE 1 CAPSULE BY MOUTH 2 (TWO) TIMES DAILY. TAKE 30 MINUTES BEFORE BREAKFAST & DINNER. 180 capsule 1   Soft Lens Products (REWETTING DROPS) SOLN Place 1 drop into both eyes daily as needed (Dry eyes).     sulfamethoxazole-trimethoprim (BACTRIM,SEPTRA) 400-80 MG tablet Take 1 tablet by mouth at bedtime. For urethritis.     tamoxifen (NOLVADEX) 10 MG tablet Take 1/2 tablet by mouth in the evening     traZODone (DESYREL) 50 MG tablet TAKE 1 TABLET BY MOUTH EVERYDAY AT BEDTIME 90 tablet 1   venlafaxine XR (EFFEXOR-XR) 75 MG 24 hr capsule Take 1 capsule (75 mg total) by mouth daily. 90 capsule 0   Vitamin D, Ergocalciferol, (DRISDOL) 1.25 MG (50000 UNIT) CAPS capsule Take 1 capsule (50,000 Units total) by mouth every 7 (seven) days. 4 capsule 0   vitamin E 180 MG (400 UNITS) capsule Take 400 Units by mouth daily.     No current facility-administered medications for this visit.     Psychiatric Specialty Exam: Review of Systems  Cardiovascular:  Negative for chest pain.  Psychiatric/Behavioral:  Negative for depression  and suicidal ideas.     There were no vitals taken for this visit.There is no height or weight on file to calculate BMI.  General Appearance: casual  Eye Contact:  fair  Speech:  Normal Rate  Volume:  Decreased  Mood: fair  Affect: congruent  Thought Process:  Goal Directed  Orientation:  Full (Time, Place, and Person)  Thought Content:  Rumination  Suicidal Thoughts:  No  Homicidal Thoughts:  No  Memory:  Immediate;   Fair Recent;   Fair  Judgement:  Fair  Insight:  Fair  Psychomotor Activity:  Normal  Concentration:  Concentration: Fair and Attention Span: Fair  Recall:  [employer] of Knowledge:Good  Language: Good  Akathisia:  No  Handed:  Right  AIMS (if indicated):    Assets:  Desire for Improvement  ADL's:  Intact  Cognition: Impaired,  Mild  Sleep:  Variable to fair on meds     Treatment Plan Summary: Medication management and Plan as follows   Prior documentation reviewed  1. MDD, mild to moderate: stable continue effexor  2. Insomnia: manageable on trazadone, continue and with sleep hygiene   Alcohol dependence:sustained remission, stable sober, continue AA Work on weight maintenance, review with PCP of options or suggestions   Fu 76m.     Thresa Ross, MD 10/25/202410:40 AM

## 2022-12-16 ENCOUNTER — Other Ambulatory Visit (HOSPITAL_COMMUNITY): Payer: Self-pay | Admitting: Psychiatry

## 2022-12-16 DIAGNOSIS — F331 Major depressive disorder, recurrent, moderate: Secondary | ICD-10-CM

## 2022-12-18 ENCOUNTER — Other Ambulatory Visit (HOSPITAL_COMMUNITY): Payer: Self-pay

## 2022-12-18 DIAGNOSIS — I480 Paroxysmal atrial fibrillation: Secondary | ICD-10-CM

## 2022-12-18 MED ORDER — APIXABAN 5 MG PO TABS
5.0000 mg | ORAL_TABLET | Freq: Two times a day (BID) | ORAL | Status: DC
Start: 2022-12-10 — End: 2023-04-13

## 2022-12-28 ENCOUNTER — Ambulatory Visit: Payer: Medicare Other | Admitting: Cardiology

## 2022-12-29 DIAGNOSIS — M47812 Spondylosis without myelopathy or radiculopathy, cervical region: Secondary | ICD-10-CM | POA: Diagnosis not present

## 2022-12-29 DIAGNOSIS — G894 Chronic pain syndrome: Secondary | ICD-10-CM | POA: Diagnosis not present

## 2022-12-29 DIAGNOSIS — M150 Primary generalized (osteo)arthritis: Secondary | ICD-10-CM | POA: Diagnosis not present

## 2022-12-29 DIAGNOSIS — L4052 Psoriatic arthritis mutilans: Secondary | ICD-10-CM | POA: Diagnosis not present

## 2022-12-30 DIAGNOSIS — H52223 Regular astigmatism, bilateral: Secondary | ICD-10-CM | POA: Diagnosis not present

## 2022-12-30 DIAGNOSIS — H3540 Unspecified peripheral retinal degeneration: Secondary | ICD-10-CM | POA: Diagnosis not present

## 2022-12-30 DIAGNOSIS — H524 Presbyopia: Secondary | ICD-10-CM | POA: Diagnosis not present

## 2022-12-30 DIAGNOSIS — H5212 Myopia, left eye: Secondary | ICD-10-CM | POA: Diagnosis not present

## 2022-12-30 DIAGNOSIS — H5201 Hypermetropia, right eye: Secondary | ICD-10-CM | POA: Diagnosis not present

## 2023-01-06 ENCOUNTER — Other Ambulatory Visit (HOSPITAL_COMMUNITY): Payer: Self-pay | Admitting: Psychiatry

## 2023-01-06 DIAGNOSIS — F331 Major depressive disorder, recurrent, moderate: Secondary | ICD-10-CM

## 2023-01-07 ENCOUNTER — Ambulatory Visit: Payer: Medicare Other | Admitting: Adult Health

## 2023-01-07 ENCOUNTER — Other Ambulatory Visit: Payer: Self-pay | Admitting: Adult Health

## 2023-01-07 ENCOUNTER — Encounter: Payer: Self-pay | Admitting: Adult Health

## 2023-01-07 VITALS — BP 130/90 | HR 64 | Temp 98.2°F | Ht 66.0 in | Wt 231.0 lb

## 2023-01-07 DIAGNOSIS — K219 Gastro-esophageal reflux disease without esophagitis: Secondary | ICD-10-CM | POA: Diagnosis not present

## 2023-01-07 DIAGNOSIS — Z9889 Other specified postprocedural states: Secondary | ICD-10-CM

## 2023-01-07 NOTE — Progress Notes (Signed)
Subjective:    Patient ID: Tonya Bartlett, female    DOB: [birth year], 77 y.o.   MRN: [id number]  HPI  77 year old female who  has a past medical history of Alcohol abuse, Allergic rhinitis, Allergy, Anemia, Anxiety, Blood transfusion without reported diagnosis, Breast cancer (HCC) (12/2020), Cancer (HCC), Cataract, Clotting disorder (HCC), Colitis, Complication of anesthesia, Depression, Diverticulosis, Dysrhythmia, Fatigue, Frequency of urination, GERD (gastroesophageal reflux disease), History of colon polyps, History of DVT of lower extremity, History of hiatal hernia, History of iron deficiency anemia (1996), History of migraine headaches, History of radiation therapy, History of rib fracture, Hyperlipidemia, Hypertension, IBS (irritable bowel syndrome), Insomnia, Joint pain, MCI (mild cognitive impairment), Melanoma (HCC) (2019), Migraine headache, OA (osteoarthritis), Osteopenia, Pneumonia, PONV (postoperative nausea and vomiting), Recovering alcoholic (HCC), Right rotator cuff tear, SOB (shortness of breath) on exertion, Substance abuse (HCC), Swallowing difficulty, and Unspecified essential hypertension.  She presents to the office today for an acute on chronic issue.  She reports that she feels as though she "has a constant lump in her throat, her voice becomes hoarse, she has trouble singing, and she has some mild choking sensation when she eats food.  She has choked on her food to the point where she vomits.  Also recent endoscopy was in June 2024 at which time she had the entire esophagus dilated at 17 mm.  Was also seen by ear nose and throat on September 04, 2022 in which she had a laryngoscopy.  Procedure showed normal vocal cord mobility without vocal cord nodule, mass, polyp, or tumor.  Hypopharynx was normal without mass, pooling of secretions or aspiration.  He is prescribed Prilosec 40 mg twice daily by gastroenterology but has only been taking daily.  Review of Systems See HPI    Past Medical History:  Diagnosis Date   Alcohol abuse    Sober 41 years.   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  1996   History of hiatal hernia    History of iron deficiency anemia 1996   iron infusion   History of migraine headaches    History of radiation therapy    left breast 03/27/2021-04/23/2021 Dr Antony Blackbird   History of rib fracture    Hyperlipidemia    Hypertension    IBS (irritable bowel syndrome)    Insomnia    Joint pain    MCI (mild cognitive impairment)    Melanoma (HCC) 2019   left leg    Migraine headache    hx of migraines    OA (osteoarthritis)    RIGHT SHOULDER   Osteopenia    Pneumonia    remote history   PONV (postoperative nausea and vomiting)    Recovering alcoholic (HCC)    SINCE 01-21-1980  Right rotator cuff tear    SOB (shortness of breath) on exertion    Substance abuse (HCC)    recovering alcoholic   Swallowing difficulty    Unspecified essential hypertension     Social History   Socioeconomic History   Marital status: Widowed    Spouse name: Not on file   Number of children: 3   Years of education: Not on file   Highest education level: Master's degree (e.g., MA, MS, MEng, MEd, MSW, MBA)  Occupational History   Occupation: retired Paramedic  Tobacco Use   Smoking status: Former    Current packs/day: 0.00    Average packs/day: 0.5 packs/day for 15.0 years (7.5 ttl pk-yrs)    Types: Cigarettes     Start date: 05/16/1970    Quit date: 05/15/1985    Years since quitting: 37.6   Smokeless tobacco: Never   Tobacco comments:    Former smoker 12/10/22  Vaping Use   Vaping status: Never Used  Substance and Sexual Activity   Alcohol use: No    Comment: RECOVERING ALCOHOLIC--  QUIT [id number]   Drug use: No   Sexual activity: Not Currently    Birth control/protection: Post-menopausal  Other Topics Concern   Not on file  Social History Narrative   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    Widowed    Three kids    6 grandchildren       Social Determinants of Health   Financial Resource Strain: Low Risk  (04/18/2021)   Overall Financial Resource Strain (CARDIA)    Difficulty of Paying Living Expenses: Not hard at all  Food Insecurity: Low Risk  (09/04/2022)   Received from [facility]   Hunger Vital Sign    Worried About Running Out of Food in the Last Year: Never true    Ran Out of Food in the Last Year: Never true  Transportation Needs: Not on file (09/04/2022)  Physical Activity: Inactive (04/18/2021)   Exercise Vital Sign    Days of Exercise per Week: 0 days    Minutes of Exercise per Session: 0 min  Stress: No Stress Concern Present (04/18/2021)   [employer] of Occupational Health - Occupational Stress Questionnaire    Feeling of Stress : Not at all  Social Connections: Moderately Integrated (04/18/2021)   Social Connection and Isolation Panel [NHANES]    Frequency of Communication with Friends and Family: More than three times a week    Frequency of Social Gatherings with Friends and Family: More than three times a week    Attends Religious Services: More than 4 times per year    Active Member of [employer] or Organizations: Yes    Attends Banker Meetings: More than 4 times per year    Marital Status: Widowed  Intimate Partner Violence: Not At Risk (04/18/2021)   Humiliation, Afraid, Rape, and Kick questionnaire    Fear of  Current or Ex-Partner: No    Emotionally Abused: No    Physically Abused: No    Sexually Abused: No    Past Surgical History:  Procedure Laterality Date   ATRIAL FIBRILLATION ABLATION N/A 10/13/2022   Procedure: ATRIAL FIBRILLATION ABLATION;  Surgeon: Lanier Prude, MD;  Location: [facility];  Service: Cardiovascular;  Laterality: N/A;   BREAST BIOPSY Left 01/06/2021   pos   BREAST LUMPECTOMY WITH RADIOACTIVE SEED LOCALIZATION Left 02/27/2021   Procedure: LEFT BREAST LUMPECTOMY WITH RADIOACTIVE SEED LOCALIZATION;  Surgeon:  Almond Lint, MD;  Location: [facility];  Service: General;  Laterality: Left;   BUNIONECTOMY/  HAMMERTOE CORRECTION  RIGHT FOOT  2011   CATARACT EXTRACTION W/ INTRAOCULAR LENS  IMPLANT, BILATERAL     COLONOSCOPY     DILATION AND CURETTAGE OF UTERUS  1976   EYE SURGERY Bilateral 2016   cataract removal   KNEE ARTHROSCOPY W/ MENISCECTOMY Bilateral X2  LEFT /    X1  RIGHT   KNEE OPEN LATERAL RELEASE Bilateral    MOHS SURGERY Left 12/15/2017   Melanoma in situ - left calf - [facility]   ORIF ANKLE FRACTURE Right 08/04/2020   Procedure: OPEN REDUCTION INTERNAL FIXATION (ORIF) ANKLE FRACTURE;  Surgeon: Toni Arthurs, MD;  Location: [facility];  Service: Orthopedics;  Laterality: Right;  Mini C-arm, [employer] Small Frag   REPLACEMENT TOTAL KNEE Left 2006   SHOULDER ARTHROSCOPY WITH SUBACROMIAL DECOMPRESSION, ROTATOR CUFF REPAIR AND BICEP TENDON REPAIR Right 05/23/2013   Procedure: RIGHT SHOULDER ARTHROSCOPY EXAM UNDER ANESTHESIA  WITH SUBACROMIAL DECOMPRESSION,DISTAL CLAVICLE RESECTION, SADLABRAL DEBRIDEMENT CHONDROPLASTY, BICEP TENOTOMY ;  Surgeon: Eugenia Mcalpine, MD;  Location: [facility];  Service: Orthopedics;  Laterality: Right;   TONSILLECTOMY AND ADENOIDECTOMY  AGE 77   TOTAL HIP ARTHROPLASTY Left 05/04/2017   Procedure: LEFT TOTAL HIP ARTHROPLASTY ANTERIOR APPROACH;  Surgeon: Durene Romans, MD;  Location: [facility];  Service:  Orthopedics;  Laterality: Left;   TOTAL HIP ARTHROPLASTY Right 08/01/2019   Procedure: TOTAL HIP ARTHROPLASTY ANTERIOR APPROACH;  Surgeon: Durene Romans, MD;  Location: [facility];  Service: Orthopedics;  Laterality: Right;    TOTAL KNEE ARTHROPLASTY Bilateral LEFT  1996/   RIGHT 2004   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      UPPER GASTROINTESTINAL ENDOSCOPY     UPPER GI ENDOSCOPY     VAGINAL HYSTERECTOMY  1976    Family History  Problem Relation Age of Onset   Cancer Mother    Heart disease Mother    Hypertension Mother    Melanoma Mother 70   Obesity Mother    Heart disease Father 26   Hypertension Father    Esophageal cancer Brother    Dementia Paternal Grandfather    Melanoma Niece 2   Colon cancer Neg Hx    Colon polyps Neg Hx    Stomach cancer Neg Hx    Rectal cancer Neg Hx     Allergies  Allergen Reactions   Nsaids Other (See Comments) and Anaphylaxis    SEVERE STOMACH CRAMPS, MOUTH SORES  **Able to tolerate Tylenol   Otezla [Apremilast] Anaphylaxis    Suicidal ideation   Penicillins Anaphylaxis and Other (See Comments)    Has patient had a PCN reaction causing immediate rash, facial/tongue/throat swelling, SOB or lightheadedness with hypotension:  Yes  Has patient had a PCN reaction causing severe rash involving mucus membranes or skin necrosis: Yes  Has patient had a PCN reaction that required hospitalization: No  Has patient had a PCN reaction occurring within the last 10 year No  If all of the above answers are "NO", then may proceed with Cephalosporin use.   Erythromycin Other (See Comments)    SEVERE STOMACH CRAMPS   Morphine And Codeine Nausea And Vomiting   Penicillamine Other (See Comments)   Remicade [Infliximab] Other (See Comments)    Shut down immune system-BP high   Risankizumab Other (See Comments)    URI, fever, cough, UTI Skyrizi   Tolmetin     SEVERE STOMACH CRAMPS  Nickel Rash    Including snaps on hospital gowns     Current  Outpatient Medications on File Prior to Visit  Medication Sig Dispense Refill   acetaminophen (TYLENOL) 325 MG tablet Take 650 mg by mouth every 6 (six) hours as needed for moderate pain.     apixaban (ELIQUIS) 5 MG TABS tablet Take 1 tablet (5 mg total) by mouth 2 (two) times daily. 84 tablet    Ascorbic Acid (VITAMIN C) 1000 MG tablet Take 1,000 mg by mouth daily.     atorvastatin (LIPITOR) 40 MG tablet TAKE 1 TABLET BY MOUTH EVERY DAY 90 tablet 3   betamethasone dipropionate 0.05 % lotion APPLY TOPICALLY TWICE A DAY 60 mL 0   betamethasone, augmented, (DIPROLENE) 0.05 % lotion Apply topically 2 (two) times daily. 60 mL 0   colchicine 0.6 MG tablet Take 1 tablet (0.6 mg total) by mouth 2 (two) times daily for 5 days. 10 tablet 0   HYDROcodone-acetaminophen (NORCO/VICODIN) 5-325 MG tablet Take 2 tablets by mouth every 4 (four) hours as needed for moderate pain or severe pain.     hydrocortisone cream 1 % Apply 1 application topically daily as needed for itching.     L-Methylfolate-B12-B6-B2 (CEREFOLIN) 07-17-48-5 MG TABS TAKE 1 TABLET BY MOUTH DAILY 90 tablet 0   losartan (COZAAR) 100 MG tablet Take 1 tablet (100 mg total) by mouth daily. 90 tablet 3   metFORMIN (GLUCOPHAGE) 500 MG tablet Take 1 tablet (500 mg total) by mouth 2 (two) times daily with a meal. 60 tablet 0   metoprolol tartrate (LOPRESSOR) 25 MG tablet TAKE 0.5 TABLETS BY MOUTH 2 TIMES DAILY. (Patient taking differently: Take 25 mg by mouth 2 (two) times daily.) 90 tablet 1   omeprazole (PRILOSEC) 40 MG capsule TAKE 1 CAPSULE BY MOUTH 2 (TWO) TIMES DAILY. TAKE 30 MINUTES BEFORE BREAKFAST & DINNER. 180 capsule 1   Soft Lens Products (REWETTING DROPS) SOLN Place 1 drop into both eyes daily as needed (Dry eyes).     sulfamethoxazole-trimethoprim (BACTRIM,SEPTRA) 400-80 MG tablet Take 1 tablet by mouth at bedtime. For urethritis.     tamoxifen (NOLVADEX) 10 MG tablet Take 1/2 tablet by mouth in the evening     traZODone (DESYREL) 50 MG  tablet TAKE 1 TABLET BY MOUTH EVERYDAY AT BEDTIME 90 tablet 1   venlafaxine XR (EFFEXOR-XR) 75 MG 24 hr capsule Take 1 capsule (75 mg total) by mouth daily. 30 capsule 0   venlafaxine XR (EFFEXOR-XR) 75 MG 24 hr capsule TAKE 1 CAPSULE BY MOUTH EVERY DAY 90 capsule 0   Vitamin D, Ergocalciferol, (DRISDOL) 1.25 MG (50000 UNIT) CAPS capsule Take 1 capsule (50,000 Units total) by mouth every 7 (seven) days. 4 capsule 0   vitamin E 180 MG (400 UNITS) capsule Take 400 Units by mouth daily.     No current facility-administered medications on file prior to visit.    BP (!) 130/90   Pulse 64   Temp 98.2 F (36.8 C) (Oral)   Ht 5\' 6"  (1.676 m)   Wt 231 lb (104.8 kg)   SpO2 97%   BMI 37.28 kg/m       Objective:   Physical Exam Vitals and nursing note reviewed.  Constitutional:      Appearance: Normal appearance.  HENT:     Mouth/Throat:     Mouth: Mucous membranes are moist.     Pharynx: Oropharynx is clear. No oropharyngeal exudate or posterior oropharyngeal erythema.     Comments: Mildly  hoarse voice   Cardiovascular:     Rate and Rhythm: Normal rate and regular rhythm.     Pulses: Normal pulses.     Heart sounds: Normal heart sounds.  Pulmonary:     Effort: Pulmonary effort is normal.     Breath sounds: Normal breath sounds.  Abdominal:     General: Abdomen is flat. Bowel sounds are normal.     Palpations: Abdomen is soft.  Musculoskeletal:        General: Normal range of motion.  Skin:    General: Skin is warm and dry.  Neurological:     General: No focal deficit present.     Mental Status: She is alert and oriented to person, place, and time.  Psychiatric:        Mood and Affect: Mood normal.        Behavior: Behavior normal.        Thought Content: Thought content normal.        Judgment: Judgment normal.        Assessment & Plan:  1. Laryngopharyngeal reflux - Encouraged to take Prilosec 40 mg BID and see what happens over the next 3-4 weeks - Can follow up if  not improving    Shirline Frees, NP  Time spent with patient today was 31 minutes which consisted of chart review, discussing laryngopharyngeal reflux , work up, treatment answering questions and documentation.

## 2023-01-08 ENCOUNTER — Emergency Department (HOSPITAL_COMMUNITY): Payer: Medicare Other

## 2023-01-08 ENCOUNTER — Encounter (HOSPITAL_COMMUNITY): Payer: Self-pay | Admitting: Emergency Medicine

## 2023-01-08 ENCOUNTER — Emergency Department (HOSPITAL_COMMUNITY)
Admission: EM | Admit: 2023-01-08 | Discharge: 2023-01-08 | Disposition: A | Discharge status: Home / Self Care | Payer: Medicare Other | Attending: Emergency Medicine | Admitting: Emergency Medicine

## 2023-01-08 ENCOUNTER — Other Ambulatory Visit: Payer: Self-pay

## 2023-01-08 DIAGNOSIS — Y9301 Activity, walking, marching and hiking: Secondary | ICD-10-CM | POA: Insufficient documentation

## 2023-01-08 DIAGNOSIS — W19XXXA Unspecified fall, initial encounter: Secondary | ICD-10-CM

## 2023-01-08 DIAGNOSIS — M79644 Pain in right finger(s): Secondary | ICD-10-CM | POA: Insufficient documentation

## 2023-01-08 DIAGNOSIS — I10 Essential (primary) hypertension: Secondary | ICD-10-CM | POA: Diagnosis not present

## 2023-01-08 DIAGNOSIS — W108XXA Fall (on) (from) other stairs and steps, initial encounter: Secondary | ICD-10-CM | POA: Diagnosis not present

## 2023-01-08 DIAGNOSIS — Z853 Personal history of malignant neoplasm of breast: Secondary | ICD-10-CM | POA: Insufficient documentation

## 2023-01-08 DIAGNOSIS — M129 Arthropathy, unspecified: Secondary | ICD-10-CM | POA: Diagnosis not present

## 2023-01-08 DIAGNOSIS — S199XXA Unspecified injury of neck, initial encounter: Secondary | ICD-10-CM | POA: Diagnosis not present

## 2023-01-08 DIAGNOSIS — S0001XA Abrasion of scalp, initial encounter: Secondary | ICD-10-CM | POA: Diagnosis not present

## 2023-01-08 DIAGNOSIS — S0990XA Unspecified injury of head, initial encounter: Secondary | ICD-10-CM | POA: Diagnosis not present

## 2023-01-08 DIAGNOSIS — S0101XA Laceration without foreign body of scalp, initial encounter: Secondary | ICD-10-CM | POA: Diagnosis not present

## 2023-01-08 DIAGNOSIS — Z86718 Personal history of other venous thrombosis and embolism: Secondary | ICD-10-CM | POA: Diagnosis not present

## 2023-01-08 DIAGNOSIS — Z79899 Other long term (current) drug therapy: Secondary | ICD-10-CM | POA: Diagnosis not present

## 2023-01-08 DIAGNOSIS — M79641 Pain in right hand: Secondary | ICD-10-CM | POA: Diagnosis not present

## 2023-01-08 DIAGNOSIS — M1811 Unilateral primary osteoarthritis of first carpometacarpal joint, right hand: Secondary | ICD-10-CM | POA: Diagnosis not present

## 2023-01-08 DIAGNOSIS — Z7901 Long term (current) use of anticoagulants: Secondary | ICD-10-CM | POA: Insufficient documentation

## 2023-01-08 DIAGNOSIS — S3993XA Unspecified injury of pelvis, initial encounter: Secondary | ICD-10-CM | POA: Diagnosis not present

## 2023-01-08 DIAGNOSIS — Z96643 Presence of artificial hip joint, bilateral: Secondary | ICD-10-CM | POA: Diagnosis not present

## 2023-01-08 DIAGNOSIS — M778 Other enthesopathies, not elsewhere classified: Secondary | ICD-10-CM | POA: Diagnosis not present

## 2023-01-08 DIAGNOSIS — S299XXA Unspecified injury of thorax, initial encounter: Secondary | ICD-10-CM | POA: Diagnosis not present

## 2023-01-08 LAB — CBC WITH DIFFERENTIAL/PLATELET
Abs Immature Granulocytes: 0.04 10*3/uL (ref 0.00–0.07)
Basophils Absolute: 0.1 10*3/uL (ref 0.0–0.1)
Basophils Relative: 1 %
Eosinophils Absolute: 0.2 10*3/uL (ref 0.0–0.5)
Eosinophils Relative: 2 %
HCT: 36 % (ref 36.0–46.0)
Hemoglobin: 11.6 g/dL — ABNORMAL LOW (ref 12.0–15.0)
Immature Granulocytes: 1 %
Lymphocytes Relative: 16 %
Lymphs Abs: 1 10*3/uL (ref 0.7–4.0)
MCH: 33.3 pg (ref 26.0–34.0)
MCHC: 32.2 g/dL (ref 30.0–36.0)
MCV: 103.4 fL — ABNORMAL HIGH (ref 80.0–100.0)
Monocytes Absolute: 0.5 10*3/uL (ref 0.1–1.0)
Monocytes Relative: 7 %
Neutro Abs: 4.5 10*3/uL (ref 1.7–7.7)
Neutrophils Relative %: 73 %
Platelets: 253 10*3/uL (ref 150–400)
RBC: 3.48 MIL/uL — ABNORMAL LOW (ref 3.87–5.11)
RDW: 12.3 % (ref 11.5–15.5)
WBC: 6.2 10*3/uL (ref 4.0–10.5)
nRBC: 0 % (ref 0.0–0.2)

## 2023-01-08 MED ORDER — ACETAMINOPHEN 500 MG PO TABS
1000.0000 mg | ORAL_TABLET | Freq: Once | ORAL | Status: AC
  Administered 2023-01-08: 1000 mg via ORAL
  Filled 2023-01-08: qty 2

## 2023-01-08 NOTE — Progress Notes (Signed)
Orthopedic Tech Progress Note Patient Details:  Tonya Bartlett [birth year] [id number]  Patient ID: [name], female   DOB: 1946-02-09, 77 y.o.   MRN: [id number] Level 2 trauma [name] 01/08/2023, 1:24 PM

## 2023-01-08 NOTE — ED Provider Notes (Signed)
[place] [facility] AT [facility] Provider Note   CSN: [id number] Arrival date & time: 01/08/23  1251     History  Chief Complaint  Patient presents with   Fall   Trauma    Level 2    Tonya Bartlett is a 77 y.o. female.  Pt is a 77 yo female with pmhx significant for oa, hld, htn, dvt (on eliquis), depression, gerd, IBS,melanoma, and breast cancer.  Pt did trip and fall down some steps and hit her head on a brick wall.  She is on Eliquis.  She did not have a loc.  She also has some right thumb pain.  Level 2 trauma called.       Home Medications Prior to Admission medications   Medication Sig Start Date End Date Taking? Authorizing Provider  acetaminophen (TYLENOL) 325 MG tablet Take 650 mg by mouth every 6 (six) hours as needed for moderate pain.    [provider]  apixaban (ELIQUIS) 5 MG TABS tablet Take 1 tablet (5 mg total) by mouth 2 (two) times daily. 12/10/22   Fenton, Clint R, PA  Ascorbic Acid (VITAMIN C) 1000 MG tablet Take 1,000 mg by mouth daily.    [provider]  atorvastatin (LIPITOR) 40 MG tablet TAKE 1 TABLET BY MOUTH EVERY DAY 09/16/22   Runell Gess, MD  betamethasone dipropionate 0.05 % lotion APPLY TOPICALLY TWICE A DAY 09/16/22   Nafziger, Kandee Keen, NP  betamethasone, augmented, (DIPROLENE) 0.05 % lotion Apply topically 2 (two) times daily. 05/06/22   Nafziger, Kandee Keen, NP  colchicine 0.6 MG tablet Take 1 tablet (0.6 mg total) by mouth 2 (two) times daily for 5 days. 10/13/22 01/07/23  Graciella Freer, PA-C  HYDROcodone-acetaminophen (NORCO/VICODIN) 5-325 MG tablet Take 2 tablets by mouth every 4 (four) hours as needed for moderate pain or severe pain. 03/07/21   [provider]  hydrocortisone cream 1 % Apply 1 application topically daily as needed for itching.    [provider]  L-Methylfolate-B12-B6-B2 (CEREFOLIN) 07-17-48-5 MG TABS TAKE 1 TABLET BY MOUTH DAILY 11/19/22   Glean Salvo, NP   losartan (COZAAR) 100 MG tablet Take 1 tablet (100 mg total) by mouth daily. 06/17/22   Nafziger, Kandee Keen, NP  metFORMIN (GLUCOPHAGE) 500 MG tablet Take 1 tablet (500 mg total) by mouth 2 (two) times daily with a meal. 07/22/22   Dalbert Garnet, Caren D, MD  metoprolol tartrate (LOPRESSOR) 25 MG tablet TAKE 0.5 TABLETS BY MOUTH 2 TIMES DAILY. Patient taking differently: Take 25 mg by mouth 2 (two) times daily. 05/14/22   Little Ishikawa, MD  omeprazole (PRILOSEC) 40 MG capsule TAKE 1 CAPSULE BY MOUTH 2 (TWO) TIMES DAILY. TAKE 30 MINUTES BEFORE BREAKFAST & DINNER. 10/15/22   Arnaldo Natal, NP  Soft Lens Products (REWETTING DROPS) SOLN Place 1 drop into both eyes daily as needed (Dry eyes).    [provider]  sulfamethoxazole-trimethoprim (BACTRIM,SEPTRA) 400-80 MG tablet Take 1 tablet by mouth at bedtime. For urethritis.    Ihor Gully, MD  tamoxifen (NOLVADEX) 10 MG tablet Take 1/2 tablet by mouth in the evening 03/17/22   Newman Nip, NP  traZODone (DESYREL) 50 MG tablet TAKE 1 TABLET BY MOUTH EVERYDAY AT BEDTIME 06/17/22   Nafziger, Kandee Keen, NP  venlafaxine XR (EFFEXOR-XR) 75 MG 24 hr capsule Take 1 capsule (75 mg total) by mouth daily. 12/16/22   Thresa Ross, MD  venlafaxine XR (EFFEXOR-XR) 75 MG 24 hr capsule TAKE 1  CAPSULE BY MOUTH EVERY DAY 01/06/23   Thresa Ross, MD  Vitamin D, Ergocalciferol, (DRISDOL) 1.25 MG (50000 UNIT) CAPS capsule Take 1 capsule (50,000 Units total) by mouth every 7 (seven) days. 07/22/22   Quillian Quince D, MD  vitamin E 180 MG (400 UNITS) capsule Take 400 Units by mouth daily.    [provider]      Allergies    Nsaids, Otezla [apremilast], Penicillins, Erythromycin, Morphine and codeine, Penicillamine, Remicade [infliximab], Risankizumab, Tolmetin, and Nickel    Review of Systems   Review of Systems  Musculoskeletal:        R thumb pain  Skin:  Positive for wound.  Neurological:  Positive for headaches.  All other systems reviewed  and are negative.   Physical Exam Updated Vital Signs BP (!) 165/71   Pulse (!) 53   Temp 97.9 F (36.6 C)   Resp 14   Ht 5\' 6"  (1.676 m)   Wt 104.8 kg   SpO2 100%   BMI 37.28 kg/m  Physical Exam Vitals and nursing note reviewed.  Constitutional:      Appearance: Normal appearance.  HENT:     Head: Normocephalic.     Comments: Left side scalp multiple areas of skin and hair missing.  Nothing to repair    Right Ear: External ear normal.     Left Ear: External ear normal.     Nose: Nose normal.     Mouth/Throat:     Mouth: Mucous membranes are moist.     Pharynx: Oropharynx is clear.  Eyes:     Extraocular Movements: Extraocular movements intact.     Conjunctiva/sclera: Conjunctivae normal.     Pupils: Pupils are equal, round, and reactive to light.  Cardiovascular:     Rate and Rhythm: Normal rate and regular rhythm.     Pulses: Normal pulses.     Heart sounds: Normal heart sounds.  Pulmonary:     Effort: Pulmonary effort is normal.     Breath sounds: Normal breath sounds.  Abdominal:     General: Abdomen is flat. Bowel sounds are normal.     Palpations: Abdomen is soft.  Musculoskeletal:     Cervical back: Normal range of motion and neck supple.     Comments: R thumb pain  Skin:    General: Skin is warm.     Capillary Refill: Capillary refill takes less than 2 seconds.  Neurological:     General: No focal deficit present.     Mental Status: She is alert and oriented to person, place, and time.  Psychiatric:        Mood and Affect: Mood normal.     ED Results / Procedures / Treatments   Labs (all labs ordered are listed, but only abnormal results are displayed) Labs Reviewed  CBC WITH DIFFERENTIAL/PLATELET - Abnormal; Notable for the following components:      Result Value   RBC 3.48 (*)    Hemoglobin 11.6 (*)    MCV 103.4 (*)    All other components within normal limits  BASIC METABOLIC PANEL    EKG None  Radiology DG Pelvis Portable  Result  Date: 01/08/2023 CLINICAL DATA:  Trauma, fall EXAM: PORTABLE PELVIS 1-2 VIEWS COMPARISON:  08/01/2019 FINDINGS: Bilateral total hip prostheses. No acute bony findings. Visualized bowel unremarkable. IMPRESSION: 1. Bilateral total hip prostheses. No acute findings. Electronically Signed   By: Gaylyn Rong M.D.   On: 01/08/2023 14:14   DG Hand Complete Right  Result Date: 01/08/2023 CLINICAL DATA:  Pain in hand, fall EXAM: RIGHT HAND - COMPLETE 3+ VIEW COMPARISON:  None Available. FINDINGS: Interphalangeal articular space narrowing and spurring. Prominent osteoarthritis at the first carpometacarpal articulation and laterally in the carpus. Reduced radiocarpal articular space compatible with degenerative chondral thinning. Mild spurring along the radial side of the heads of the second and third metacarpals with chondral thinning at the MCP joints. Faint chondrocalcinosis in the vicinity of the TFCC disc. Accounting for this extensive arthropathy, no definite fracture or acute bony finding is identified. Pronator fat pad unremarkable. IMPRESSION: 1. No acute bony findings. 2. Extensive osteoarthritis. 3. Faint chondrocalcinosis in the vicinity of the TFCC disc. 4. If the patient's tenderness is in the vicinity of the scaphoid/anatomic snuffbox, then cross-sectional imaging or presumptive treatment for occult scaphoid fracture might be considered. Electronically Signed   By: Gaylyn Rong M.D.   On: 01/08/2023 14:13   DG Chest Port 1 View  Result Date: 01/08/2023 CLINICAL DATA:  Trauma, fall EXAM: PORTABLE CHEST 1 VIEW COMPARISON:  03/01/2020 FINDINGS: Borderline enlargement of the cardiopericardial silhouette with upper zone pulmonary vascular prominence which could be from pulmonary venous hypertension (although reduced specificity due to semi erect positioning of the patient). Left axillary clips noted. The lungs appear otherwise clear. No blunting of the costophrenic angles. Degenerative  glenohumeral arthropathy is present on the left. No definite acute bony findings. IMPRESSION: 1. Borderline enlargement of the cardiopericardial silhouette with upper zone pulmonary vascular prominence which could be from pulmonary venous hypertension. 2. Degenerative glenohumeral arthropathy on the left. Electronically Signed   By: Gaylyn Rong M.D.   On: 01/08/2023 14:10   CT HEAD WO CONTRAST  Result Date: 01/08/2023 CLINICAL DATA:  Head trauma, moderate-severe; Polytrauma, blunt EXAM: CT HEAD WITHOUT CONTRAST CT CERVICAL SPINE WITHOUT CONTRAST TECHNIQUE: Multidetector CT imaging of the head and cervical spine was performed following the standard protocol without intravenous contrast. Multiplanar CT image reconstructions of the cervical spine were also generated. RADIATION DOSE REDUCTION: This exam was performed according to the departmental dose-optimization program which includes automated exposure control, adjustment of the mA and/or kV according to patient size and/or use of iterative reconstruction technique. COMPARISON:  None Available. FINDINGS: CT HEAD FINDINGS Brain: No evidence of acute infarction, hemorrhage, hydrocephalus, extra-axial collection or mass lesion/mass effect. Vascular: No hyperdense vessel. Skull: No acute fracture. Sinuses/Orbits: Clear sinuses.  No acute orbital findings. Other: No mastoid effusions. Left posterior scalp contusion/laceration. CT CERVICAL SPINE FINDINGS Alignment: Straightening.  No substantial sagittal subluxation. Skull base and vertebrae: Vertebral body heights are maintained. No evidence of acute fracture. Soft tissues and spinal canal: No prevertebral fluid or swelling. No visible canal hematoma. Disc levels: Moderate degenerative disease at C4-C5. Upper chest: Visualized lung apices are clear. IMPRESSION: 1. No evidence of acute abnormality intracranially or in the cervical spine. 2. Left posterior scalp contusion/laceration. Electronically Signed   By:  Feliberto Harts M.D.   On: 01/08/2023 13:58   CT CERVICAL SPINE WO CONTRAST  Result Date: 01/08/2023 CLINICAL DATA:  Head trauma, moderate-severe; Polytrauma, blunt EXAM: CT HEAD WITHOUT CONTRAST CT CERVICAL SPINE WITHOUT CONTRAST TECHNIQUE: Multidetector CT imaging of the head and cervical spine was performed following the standard protocol without intravenous contrast. Multiplanar CT image reconstructions of the cervical spine were also generated. RADIATION DOSE REDUCTION: This exam was performed according to the departmental dose-optimization program which includes automated exposure control, adjustment of the mA and/or kV according to patient size and/or use of iterative reconstruction technique.  COMPARISON:  None Available. FINDINGS: CT HEAD FINDINGS Brain: No evidence of acute infarction, hemorrhage, hydrocephalus, extra-axial collection or mass lesion/mass effect. Vascular: No hyperdense vessel. Skull: No acute fracture. Sinuses/Orbits: Clear sinuses.  No acute orbital findings. Other: No mastoid effusions. Left posterior scalp contusion/laceration. CT CERVICAL SPINE FINDINGS Alignment: Straightening.  No substantial sagittal subluxation. Skull base and vertebrae: Vertebral body heights are maintained. No evidence of acute fracture. Soft tissues and spinal canal: No prevertebral fluid or swelling. No visible canal hematoma. Disc levels: Moderate degenerative disease at C4-C5. Upper chest: Visualized lung apices are clear. IMPRESSION: 1. No evidence of acute abnormality intracranially or in the cervical spine. 2. Left posterior scalp contusion/laceration. Electronically Signed   By: Feliberto Harts M.D.   On: 01/08/2023 13:58    Procedures Procedures    Medications Ordered in ED Medications  acetaminophen (TYLENOL) tablet 1,000 mg (1,000 mg Oral Given 01/08/23 1408)    ED Course/ Medical Decision Making/ A&P                                 Medical Decision Making Amount and/or  Complexity of Data Reviewed Labs: ordered. Radiology: ordered.  Risk OTC drugs.   This patient presents to the ED for concern of fall, this involves an extensive number of treatment options, and is a complaint that carries with it a high risk of complications and morbidity.  The differential diagnosis includes multiple trauma   Co morbidities that complicate the patient evaluation  oa, hld, htn, dvt (on eliquis), depression, gerd, IBS,melanoma, and breast cancer   Additional history obtained:  Additional history obtained from epic chart review External records from outside source obtained and reviewed including EMS report   Lab Tests:  I Ordered, and personally interpreted labs.  The pertinent results include:  cbc with hgb 11.6 (stable)   Imaging Studies ordered:  I ordered imaging studies including cxr, pelvis, r hand, ct head/c-spine  I independently visualized and interpreted imaging which showed  CT head/C-spine:  No evidence of acute abnormality intracranially or in the  cervical spine.  2. Left posterior scalp contusion/laceration.   CXR: Borderline enlargement of the cardiopericardial silhouette with  upper zone pulmonary vascular prominence which could be from  pulmonary venous hypertension.  2. Degenerative glenohumeral arthropathy on the left.   R hand 1. No acute bony findings.  2. Extensive osteoarthritis.  3. Faint chondrocalcinosis in the vicinity of the TFCC disc.  4. If the patient's tenderness is in the vicinity of the  scaphoid/anatomic snuffbox, then cross-sectional imaging or  presumptive treatment for occult scaphoid fracture might be  considered.    (Pain is in the distal phalanx; not snuffbox) Pelvis: 1. Bilateral total hip prostheses. No acute findings.  I agree with the radiologist interpretation    Medicines ordered and prescription drug management:  I ordered medication including tylenol  for sx  Reevaluation of the patient after  these medicines showed that the patient improved I have reviewed the patients home medicines and have made adjustments as needed   Test Considered:  ct   Critical Interventions:  Trauma alert   Problem List / ED Course:  Fall:  pt able to ambulate.  She is stable for d/c. Scalp abrasion:  abx ointment applied.  Pt is to return if worse.    Reevaluation:  After the interventions noted above, I reevaluated the patient and found that they have :improved   Social  Determinants of Health:  Lives at home   Dispostion:  After consideration of the diagnostic results and the patients response to treatment, I feel that the patent would benefit from discharge with outpatient f/u.          Final Clinical Impression(s) / ED Diagnoses Final diagnoses:  Fall, initial encounter  On apixaban therapy  Abrasion of scalp, initial encounter    Rx / DC Orders ED Discharge Orders     None         Jacalyn Lefevre, MD 01/08/23 1545

## 2023-01-08 NOTE — Progress Notes (Signed)
   01/08/23 1302  Spiritual Encounters  Type of Visit Initial;Attempt (pt unavailable)  Care provided to: Pt not available  Conversation partners present during encounter Nurse  Reason for visit Trauma  OnCall Visit No   Responded to fall on thinner in room 29, patient is not available per nusre, currently resting with nurse at bedside.

## 2023-01-08 NOTE — ED Notes (Signed)
Tech walked patient and patient walks fine.

## 2023-01-08 NOTE — ED Notes (Signed)
Patient presents via EMS from home as level 2 trauma alert for fall on thinners with headstrike. Per EMS, patient was walking down the steps when her foot got caught in a rug on the steps. Per EMS, the patient fell down approximately 4-5 steps and hit her head against a brick wall. Per EMS, patient did not lose consciousness. Patient is AAOx4 upon arrival; speaking in clear, full sentences. Patient has injury to left scalp; bleeding controlled upon arrival. Patient reports right thumb pain and that the thumb is more crooked than normal.

## 2023-01-08 NOTE — ED Notes (Signed)
Got patient into a gown on the monitor did manual blood pressure patient is resting with nurse at bedside

## 2023-01-09 ENCOUNTER — Emergency Department (HOSPITAL_BASED_OUTPATIENT_CLINIC_OR_DEPARTMENT_OTHER)
Admission: EM | Admit: 2023-01-09 | Discharge: 2023-01-09 | Disposition: A | Discharge status: Home / Self Care | Payer: Medicare Other | Attending: Emergency Medicine | Admitting: Emergency Medicine

## 2023-01-09 ENCOUNTER — Other Ambulatory Visit: Payer: Self-pay

## 2023-01-09 DIAGNOSIS — Z7901 Long term (current) use of anticoagulants: Secondary | ICD-10-CM | POA: Insufficient documentation

## 2023-01-09 DIAGNOSIS — W19XXXA Unspecified fall, initial encounter: Secondary | ICD-10-CM | POA: Insufficient documentation

## 2023-01-09 DIAGNOSIS — I10 Essential (primary) hypertension: Secondary | ICD-10-CM | POA: Insufficient documentation

## 2023-01-09 DIAGNOSIS — S0101XA Laceration without foreign body of scalp, initial encounter: Secondary | ICD-10-CM | POA: Diagnosis not present

## 2023-01-09 DIAGNOSIS — S0103XA Puncture wound without foreign body of scalp, initial encounter: Secondary | ICD-10-CM | POA: Diagnosis not present

## 2023-01-09 DIAGNOSIS — Z853 Personal history of malignant neoplasm of breast: Secondary | ICD-10-CM | POA: Insufficient documentation

## 2023-01-09 DIAGNOSIS — I4891 Unspecified atrial fibrillation: Secondary | ICD-10-CM | POA: Diagnosis not present

## 2023-01-09 DIAGNOSIS — L7622 Postprocedural hemorrhage and hematoma of skin and subcutaneous tissue following other procedure: Secondary | ICD-10-CM | POA: Diagnosis not present

## 2023-01-09 DIAGNOSIS — T148XXA Other injury of unspecified body region, initial encounter: Secondary | ICD-10-CM

## 2023-01-09 MED ORDER — LIDOCAINE HCL (PF) 1 % IJ SOLN
5.0000 mL | Freq: Once | INTRAMUSCULAR | Status: DC
  Filled 2023-01-09: qty 5

## 2023-01-09 NOTE — ED Notes (Signed)
Pt. Came to ED with an open wound to the left side of her head ,and bleeding. Pt. States she was seen at [facility] yesterday. Pt.'s whole left side of her head is encrusted with dried blood, so much so that it is hard to see the wound. The wound was cleaned with NS and scrubbed around the wound to be able to visualize it. Pt. States MD stated he is going to put a suture in th try to close the gap.

## 2023-01-09 NOTE — ED Triage Notes (Signed)
Pt. Arrived from home via POV. Pt. Had fall yesterday mechanical and got laceration to left side of head. Pt. Was seen a [facility] and discharged. Pt. Today is having bleeding from site and is on eloquis.

## 2023-01-09 NOTE — ED Notes (Signed)
Pt's head wound covered with 4x4's and secured with kling, no bleeding noted.

## 2023-01-09 NOTE — ED Provider Notes (Signed)
[place] EMERGENCY DEPARTMENT AT [facility] Provider Note  CSN: [id number] Arrival date & time: 01/09/23 1925  Chief Complaint(s) Fall (Bleeding from wound to head.)  HPI Tonya Bartlett is a 77 y.o. female history of atrial fibrillation on Eliquis, hypertension, hyperlipidemia presenting with bleeding.  Patient was seen in the emergency department yesterday after having a fall, was activated as a trauma, had imaging performed including CT head which was negative.  Patient ultimately discharged.  She reports that she did not have her head cleaned off before she left and was just covered with bandages.  Throughout the day today she has noticed dripping from the wound.  No new injuries, aches or pain since ER visit yesterday.  Did take her Eliquis this morning.   Past Medical History Past Medical History:  Diagnosis Date   Alcohol abuse    Sober 41 years.   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  1996   History of hiatal hernia    History of iron deficiency anemia 1996   iron infusion   History of migraine headaches    History of radiation therapy    left breast 03/27/2021-04/23/2021 Dr Antony Blackbird   History of rib fracture    Hyperlipidemia    Hypertension    IBS (irritable bowel syndrome)    Insomnia    Joint pain    MCI (mild cognitive impairment)    Melanoma (HCC) 2019   left leg    Migraine headache    hx of migraines    OA  (osteoarthritis)    RIGHT SHOULDER   Osteopenia    Pneumonia    remote history   PONV (postoperative nausea and vomiting)    Recovering alcoholic (HCC)    SINCE 01-21-1980   Right rotator cuff tear    SOB (shortness of breath) on exertion    Substance abuse (HCC)    recovering alcoholic   Swallowing difficulty    Unspecified essential hypertension    Patient Active Problem List   Diagnosis Date Noted   Hypercoagulable state due to paroxysmal atrial fibrillation (HCC) 12/10/2022   BMI 33.0-33.9,adult 07/22/2022   BMI 32.0-32.9,adult 04/09/2022   Obesity, Beginning BMI 36.94 04/09/2022   B12 deficiency 01/29/2022   Pain of toe of right foot 12/25/2021   Pre-diabetes 11/05/2021   Essential hypertension 11/05/2021   Left knee pain 11/05/2021   Elevated coronary artery calcium score 08/07/2021   Vitamin D deficiency 07/29/2021   Insulin resistance 07/17/2021   Dysphagia 03/21/2021   Gastroesophageal reflux disease 03/21/2021   History of colon polyps 03/21/2021   Genetic testing 01/31/2021   Aortic atherosclerosis (HCC) 01/17/2021   Family history of melanoma 01/16/2021   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  PAF (paroxysmal atrial fibrillation) (HCC) 10/22/2020   Family history of heart disease 10/22/2020   Trimalleolar fracture 07/31/2020   Right hip OA 08/01/2019   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   Insomnia 02/03/2019   Onychomycosis 11/28/2018   Obese 05/05/2017   S/P left THA, 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   MCI (mild cognitive impairment) 09/19/2015   Memory deficit 09/19/2015   SIRS (systemic inflammatory response syndrome) (HCC) 10/29/2014   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   Depression 11/16/2013   ADD (attention deficit disorder) 11/17/2011   Essential hypertension, benign 10/15/2009   SEBORRHEIC KERATOSIS, INFLAMED 10/17/2008   CARPAL TUNNEL SYNDROME, LEFT 06/22/2008   Allergic rhinitis 08/30/2007   Hyperlipidemia  11/11/2006   Migraine 11/11/2006   Osteoarthritis 11/11/2006   Home Medication(s) Prior to Admission medications   Medication Sig Start Date End Date Taking? Authorizing Provider  acetaminophen (TYLENOL) 325 MG tablet Take 650 mg by mouth every 6 (six) hours as needed for moderate pain.    [provider]  apixaban (ELIQUIS) 5 MG TABS tablet Take 1 tablet (5 mg total) by mouth 2 (two) times daily. 12/10/22   Fenton, Clint R, PA  Ascorbic Acid (VITAMIN C) 1000 MG tablet Take 1,000 mg by mouth daily.    [provider]  atorvastatin (LIPITOR) 40 MG tablet TAKE 1 TABLET BY MOUTH EVERY DAY 09/16/22   Runell Gess, MD  betamethasone dipropionate 0.05 % lotion APPLY TOPICALLY TWICE A DAY 09/16/22   Nafziger, Kandee Keen, NP  betamethasone, augmented, (DIPROLENE) 0.05 % lotion Apply topically 2 (two) times daily. 05/06/22   Nafziger, Kandee Keen, NP  colchicine 0.6 MG tablet Take 1 tablet (0.6 mg total) by mouth 2 (two) times daily for 5 days. 10/13/22 01/07/23  Graciella Freer, PA-C  HYDROcodone-acetaminophen (NORCO/VICODIN) 5-325 MG tablet Take 2 tablets by mouth every 4 (four) hours as needed for moderate pain or severe pain. 03/07/21   [provider]  hydrocortisone cream 1 % Apply 1 application topically daily as needed for itching.    [provider]  L-Methylfolate-B12-B6-B2 (CEREFOLIN) 07-17-48-5 MG TABS TAKE 1 TABLET BY MOUTH DAILY 11/19/22   Glean Salvo, NP  losartan (COZAAR) 100 MG tablet Take 1 tablet (100 mg total) by mouth daily. 06/17/22   Nafziger, Kandee Keen, NP  metFORMIN (GLUCOPHAGE) 500 MG tablet Take 1 tablet (500 mg total) by mouth 2 (two) times daily with a meal. 07/22/22   Dalbert Garnet, Caren D, MD  metoprolol tartrate (LOPRESSOR) 25 MG tablet TAKE 0.5 TABLETS BY MOUTH 2 TIMES DAILY. Patient taking differently: Take 25 mg by mouth 2 (two) times daily. 05/14/22   Little Ishikawa, MD  omeprazole (PRILOSEC) 40 MG capsule TAKE 1 CAPSULE BY MOUTH 2 (TWO) TIMES  DAILY. TAKE 30 MINUTES BEFORE BREAKFAST & DINNER. 10/15/22   Arnaldo Natal, NP  Soft Lens Products (REWETTING DROPS) SOLN Place 1 drop into both eyes daily as needed (Dry eyes).    [provider]  sulfamethoxazole-trimethoprim (BACTRIM,SEPTRA) 400-80 MG tablet Take 1 tablet by mouth at bedtime. For urethritis.    Ihor Gully, MD  tamoxifen (NOLVADEX) 10 MG tablet Take 1/2 tablet by mouth in the evening 03/17/22   Newman Nip, NP  traZODone (DESYREL) 50 MG tablet TAKE 1 TABLET BY MOUTH EVERYDAY AT BEDTIME 06/17/22   Nafziger, Kandee Keen, NP  venlafaxine XR (EFFEXOR-XR) 75 MG 24 hr capsule Take 1 capsule (75 mg total) by mouth daily. 12/16/22   Thresa Ross, MD  venlafaxine XR (EFFEXOR-XR) 75  MG 24 hr capsule TAKE 1 CAPSULE BY MOUTH EVERY DAY 01/06/23   Thresa Ross, MD  Vitamin D, Ergocalciferol, (DRISDOL) 1.25 MG (50000 UNIT) CAPS capsule Take 1 capsule (50,000 Units total) by mouth every 7 (seven) days. 07/22/22   Quillian Quince D, MD  vitamin E 180 MG (400 UNITS) capsule Take 400 Units by mouth daily.    [provider]                                                                                                                                    Past Surgical History Past Surgical History:  Procedure Laterality Date   ATRIAL FIBRILLATION ABLATION N/A 10/13/2022   Procedure: ATRIAL FIBRILLATION ABLATION;  Surgeon: Lanier Prude, MD;  Location: [facility] INVASIVE CV [facility];  Service: Cardiovascular;  Laterality: N/A;   BREAST BIOPSY Left 01/06/2021   pos   BREAST LUMPECTOMY WITH RADIOACTIVE SEED LOCALIZATION Left 02/27/2021   Procedure: LEFT BREAST LUMPECTOMY WITH RADIOACTIVE SEED LOCALIZATION;  Surgeon: Almond Lint, MD;  Location: [facility] OR;  Service: General;  Laterality: Left;   BUNIONECTOMY/  HAMMERTOE CORRECTION  RIGHT FOOT  2011   CATARACT EXTRACTION W/ INTRAOCULAR LENS  IMPLANT, BILATERAL     COLONOSCOPY     DILATION AND CURETTAGE OF UTERUS  1976   EYE SURGERY  Bilateral 2016   cataract removal   KNEE ARTHROSCOPY W/ MENISCECTOMY Bilateral X2  LEFT /    X1  RIGHT   KNEE OPEN LATERAL RELEASE Bilateral    MOHS SURGERY Left 12/15/2017   Melanoma in situ - left calf - [facility]   ORIF ANKLE FRACTURE Right 08/04/2020   Procedure: OPEN REDUCTION INTERNAL FIXATION (ORIF) ANKLE FRACTURE;  Surgeon: Toni Arthurs, MD;  Location: [facility] ORS;  Service: Orthopedics;  Laterality: Right;  Mini C-arm, [employer] Small Frag   REPLACEMENT TOTAL KNEE Left 2006   SHOULDER ARTHROSCOPY WITH SUBACROMIAL DECOMPRESSION, ROTATOR CUFF REPAIR AND BICEP TENDON REPAIR Right 05/23/2013   Procedure: RIGHT SHOULDER ARTHROSCOPY EXAM UNDER ANESTHESIA  WITH SUBACROMIAL DECOMPRESSION,DISTAL CLAVICLE RESECTION, SADLABRAL DEBRIDEMENT CHONDROPLASTY, BICEP TENOTOMY ;  Surgeon: Eugenia Mcalpine, MD;  Location: [facility];  Service: Orthopedics;  Laterality: Right;   TONSILLECTOMY AND ADENOIDECTOMY  AGE 29   TOTAL HIP ARTHROPLASTY Left 05/04/2017   Procedure: LEFT TOTAL HIP ARTHROPLASTY ANTERIOR APPROACH;  Surgeon: Durene Romans, MD;  Location: [facility] ORS;  Service: Orthopedics;  Laterality: Left;   TOTAL HIP ARTHROPLASTY Right 08/01/2019   Procedure: TOTAL HIP ARTHROPLASTY ANTERIOR APPROACH;  Surgeon: Durene Romans, MD;  Location: [facility] ORS;  Service: Orthopedics;  Laterality: Right;    TOTAL KNEE ARTHROPLASTY Bilateral LEFT  1996/   RIGHT 2004   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      UPPER GASTROINTESTINAL ENDOSCOPY     UPPER GI ENDOSCOPY     VAGINAL HYSTERECTOMY  1976   Family History Family History  Problem Relation Age of Onset  Cancer Mother    Heart disease Mother    Hypertension Mother    Melanoma Mother 68   Obesity Mother    Heart disease Father 35   Hypertension Father    Esophageal cancer Brother    Dementia Paternal Grandfather    Melanoma Niece 30   Colon cancer Neg Hx    Colon polyps Neg Hx    Stomach cancer Neg Hx    Rectal cancer Neg Hx      Social History Social History   Tobacco Use   Smoking status: Former    Current packs/day: 0.00    Average packs/day: 0.5 packs/day for 15.0 years (7.5 ttl pk-yrs)    Types: Cigarettes    Start date: 05/16/1970    Quit date: 05/15/1985    Years since quitting: 37.6   Smokeless tobacco: Never   Tobacco comments:    Former smoker 12/10/22  Vaping Use   Vaping status: Never Used  Substance Use Topics   Alcohol use: No    Comment: RECOVERING ALCOHOLIC--  QUIT [phone]   Drug use: No   Allergies Nsaids, Otezla [apremilast], Penicillins, Erythromycin, Morphine and codeine, Penicillamine, Remicade [infliximab], Risankizumab, Tolmetin, and Nickel  Review of Systems Review of Systems  All other systems reviewed and are negative.   Physical Exam Vital Signs  I have reviewed the triage vital signs BP 139/62   Pulse 89   Temp 97.7 F (36.5 C)   Resp 18   Ht 5' 6.5" (1.689 m)   Wt 104.3 kg   SpO2 97%   BMI 36.57 kg/m  Physical Exam Vitals and nursing note reviewed.  Constitutional:      Appearance: Normal appearance.  HENT:     Head: Normocephalic.     Comments: Left side of head with large area of matted blood, on cleaning, there is a punctate area of oozing bleeding which seems to be source of blood which is overlying area of swelling.  No other wounds noted    Mouth/Throat:     Mouth: Mucous membranes are moist.  Eyes:     Conjunctiva/sclera: Conjunctivae normal.  Cardiovascular:     Rate and Rhythm: Normal rate.  Pulmonary:     Effort: Pulmonary effort is normal. No respiratory distress.  Abdominal:     General: Abdomen is flat.  Musculoskeletal:        General: No deformity.  Skin:    General: Skin is warm and dry.     Capillary Refill: Capillary refill takes less than 2 seconds.  Neurological:     General: No focal deficit present.     Mental Status: She is alert. Mental status is at baseline.  Psychiatric:        Mood and Affect: Mood normal.         Behavior: Behavior normal.     ED Results and Treatments Labs (all labs ordered are listed, but only abnormal results are displayed) Labs Reviewed - No data to display  Radiology No results found.  Pertinent labs & imaging results that were available during my care of the patient were reviewed by me and considered in my medical decision making (see MDM for details).  Medications Ordered in ED Medications  lidocaine (PF) (XYLOCAINE) 1 % injection 5 mL (has no administration in time range)                                                                                                                                     Procedures .Marland KitchenLaceration Repair  Date/Time: 01/09/2023 11:35 PM  Performed by: Lonell Grandchild, MD Authorized by: Lonell Grandchild, MD   Consent:    Consent obtained:  Verbal   Consent given by:  Patient   Risks, benefits, and alternatives were discussed: yes     Risks discussed:  Infection, need for additional repair, nerve damage, pain, vascular damage, poor wound healing, poor cosmetic result, retained foreign body and tendon damage   Alternatives discussed:  No treatment Universal protocol:    Patient identity confirmed:  Verbally with patient and arm band Laceration details:    Location:  Scalp   Scalp location:  L temporal   Length (cm):  0.1 Exploration:    Limited defect created (wound extended): no     Wound exploration: wound explored through full range of motion and entire depth of wound visualized     Wound extent: areolar tissue not violated, fascia not violated, no foreign body, no signs of injury, no nerve damage, no tendon damage, no underlying fracture and no vascular damage     Contaminated: no   Treatment:    Area cleansed with:  Saline   Amount of cleaning:  Standard   Irrigation solution:  Sterile saline    Irrigation method:  Syringe   Visualized foreign bodies/material removed: no     Debridement:  None   Undermining:  None   Scar revision: no   Skin repair:    Repair method:  Sutures   Suture size:  5-0   Suture material:  Fast-absorbing gut   Suture technique:  Figure eight   Number of sutures:  2 Approximation:    Approximation:  Close Repair type:    Repair type:  Complex Post-procedure details:    Dressing:  Non-adherent dressing   Procedure completion:  Tolerated well, no immediate complications   (including critical care time)  Medical Decision Making / ED Course   MDM:  77 year old female presenting after fall yesterday with some continued bleeding from wound.  On exam she had a punctate area of bleeding.  Clean this area and placed a couple of figure-of-eight stitches with fast gut suture as well as some clotted material on top of this.  No further bleeding observed.  Advised patient to hold Eliquis tonight and tomorrow morning to allow clot to form. Will discharge patient to home. All questions answered. Patient comfortable with plan of  discharge. Return precautions discussed with patient and specified on the after visit summary.       Additional history obtained:  -External records from outside source obtained and reviewed including: Chart review including previous notes, labs, imaging, consultation notes including er visit yesterday    Medicines ordered and prescription drug management: Meds ordered this encounter  Medications   lidocaine (PF) (XYLOCAINE) 1 % injection 5 mL    -I have reviewed the patients home medicines and have made adjustments as needed     Reevaluation: After the interventions noted above, I reevaluated the patient and found that their symptoms have improved  Co morbidities that complicate the patient evaluation  Past Medical History:  Diagnosis Date   Alcohol abuse    Sober 41 years.   Allergic rhinitis    Allergy    Anemia     Anxiety    Blood transfusion without reported diagnosis    Breast cancer (HCC) 12/2020   left   Cancer (HCC)    Cataract    bil cataracts removed   Clotting disorder (HCC)    DVT- 1996 po knee replacement   Colitis    Complication of anesthesia    vomit x1 while on Morphine per pt   Depression    Diverticulosis    Dysrhythmia    Atrial fibrillation   Fatigue    Frequency of urination    GERD (gastroesophageal reflux disease)    History of colon polyps    History of DVT of lower extremity    POST LEFT TOTAL KNEE  1996   History of hiatal hernia    History of iron deficiency anemia 1996   iron infusion   History of migraine headaches    History of radiation therapy    left breast 03/27/2021-04/23/2021 Dr Antony Blackbird   History of rib fracture    Hyperlipidemia    Hypertension    IBS (irritable bowel syndrome)    Insomnia    Joint pain    MCI (mild cognitive impairment)    Melanoma (HCC) 2019   left leg    Migraine headache    hx of migraines    OA (osteoarthritis)    RIGHT SHOULDER   Osteopenia    Pneumonia    remote history   PONV (postoperative nausea and vomiting)    Recovering alcoholic (HCC)    SINCE 01-21-1980   Right rotator cuff tear    SOB (shortness of breath) on exertion    Substance abuse (HCC)    recovering alcoholic   Swallowing difficulty    Unspecified essential hypertension       Dispostion: Disposition decision including need for hospitalization was considered, and patient discharged from emergency department.    Final Clinical Impression(s) / ED Diagnoses Final diagnoses:  Bleeding from wound     This chart was dictated using voice recognition software.  Despite best efforts to proofread,  errors can occur which can change the documentation meaning.    Lonell Grandchild, MD 01/09/23 [id number]

## 2023-01-09 NOTE — Discharge Instructions (Signed)
We evaluated you for the bleeding from your scalp.  We were able to place a nonabsorbable suture and some clotting material over the area of bleeding.  Please do not shower until tomorrow to make sure that a clot forms.  The nonabsorbable suture will fall out on its own.  Please hold your Eliquis tonight and tomorrow morning.  Please start it again tomorrow evening.  Please keep an eye on the area.  We cleaned out the area, but is still possible that you could develop a skin infection.  Keep an eye out for any redness or warmth, or drainage of pus.  Please keep an eye out for any recurrent bleeding.  Although we are very hopeful that you will not need to need to return to the emergency department for this, it is always possible that it may start bleeding again.

## 2023-01-19 ENCOUNTER — Ambulatory Visit: Payer: Medicare Other | Admitting: Cardiology

## 2023-01-19 NOTE — Progress Notes (Unsigned)
Cardiology Office Note:    Date:  01/20/2023  ID:  [name], DOB 05-03-1945, MRN [id number] PCP: Shirline Frees, NP  [facility] Providers Cardiologist:  Nanetta Batty, MD Electrophysiologist:  Lanier Prude, MD       Patient Profile:      Paroxysmal atrial fibrillation S/p PVI ablation 09/2022 TTE 12/06/20: EF 73, no RWMA, Gr 1 DD, GLS -25.1, NL RVSF, mild LAE, mild MR, AV sclerosis, RAP 3 Coronary artery calcification CAC score 12/06/20: 24 (37th percentile) Pre-A-fib ablation CCTA 08/10/2022: CAC score 16.6 (31st percentile) Mild ascending aorta dilation CT 11/2020: 42 mm Aortic atherosclerosis  Hypertension  FHx of CAD (F ? 61 w MI) Hyperlipidemia  Breast CA  Hx of DVT 1996        History of Present Illness:  Discussed the use of AI scribe software for clinical note transcription with the patient, who gave verbal consent to proceed.  Tonya Bartlett is a 77 y.o. female who returns for evaluation of shortness of breath. She was last seen by Dr. Allyson Sabal 11/25/22. She had follow up in the AF clinic 12/10/22. She went to the ED after a fall 01/08/23. Head CT was neg. CXR did show borderline enlargement of cardiopericardial silhouette with upper zone pulmonary vascular prominence which could be from pulmonary venous hypertension. She had a wound that continued to bleed and she returned on 01/09/23. She was told to briefly hold Eliquis.  She is here alone. She presents with intermittent shortness of breath that has been ongoing for a few weeks. The dyspnea is particularly noticeable when walking or climbing stairs, causing the patient to pause and catch her breath. However, it does not wake the patient from sleep. In addition to the dyspnea, the patient reported nightly hot flashes that result in significant sweating. The patient also noted swelling in the right ankle, particularly after travel, which improves with leg elevation. The patient denies any chest discomfort  or pain, even during exertion or activities that induce shortness of breath, such as swimming aerobics.      Review of Systems  Constitutional: Positive for night sweats. Negative for fever.  Respiratory:  Negative for cough.   Gastrointestinal:  Positive for dysphagia. Negative for hematochezia.  Genitourinary:  Negative for hematuria.  See HPI     Studies Reviewed:   EKG Interpretation Date/Time:  Wednesday January 20 2023 10:20:10 EST Ventricular Rate:  68 PR Interval:  172 QRS Duration:  86 QT Interval:  380 QTC Calculation: 404 R Axis:   -12  Text Interpretation: Normal sinus rhythm Minimal voltage criteria for LVH, may be normal variant ( R in aVL ) No significant change since last tracing Confirmed by Tereso Newcomer [phone]) on 01/20/2023 10:23:16 AM             Risk Assessment/Calculations:    CHA2DS2-VASc Score = 5   This indicates a 7.2% annual risk of stroke. The patient's score is based upon: CHF History: 0 HTN History: 1 Diabetes History: 0 Stroke History: 0 Vascular Disease History: 1 Age Score: 2 Gender Score: 1            Physical Exam:   VS:  BP 130/88   Pulse 86   Ht 5\' 6"  (1.676 m)   Wt 232 lb 12.8 oz (105.6 kg)   SpO2 97%   BMI 37.57 kg/m    Wt Readings from Last 3 Encounters:  01/20/23 232 lb 12.8 oz (105.6 kg)  01/09/23 230 lb (  104.3 kg)  01/08/23 231 lb (104.8 kg)    Constitutional:      Appearance: Healthy appearance. Not in distress.  Neck:     Vascular: No JVR. JVD normal.  Pulmonary:     Breath sounds: Normal breath sounds. No wheezing. No rales.  Cardiovascular:     Normal rate. Regular rhythm.     Murmurs: There is no murmur.  Edema:    Peripheral edema present.    Ankle: bilateral trace edema of the ankle. Abdominal:     Palpations: Abdomen is soft.  Skin:    General: Skin is dry.        Assessment and Plan:   Assessment & Plan Shortness of breath She presents with symptoms of dyspnea on exertion over the past  several weeks. She had a recent fall and the CXR done in the ED showed increased pulmonary vascular prominence and findings that could be c/w pulmonary venous hypertension. Question if she is volume overloaded. Her wt is up 6 lbs since she saw Dr. Allyson Sabal. However, her exam is not suggestive of volume excess. She is not having chest pain but I suspect her dyspnea on exertion could be an anginal equivalent. She does have a mildly elevated CAC score. Her symptoms do not seem to be c/w her symptoms of atrial fibrillation. Her symptoms started before she went on a trip and her O2 sats are normal on RA, so I do not think she needs evaluation for pulmonary embolism. -NT Pro BNP today -Add Lasix if BNP elevated -2D Echocardiogram  -Myocardial PET -Follow up 8-10 weeks.  PAF (paroxysmal atrial fibrillation) (HCC) Maintaining NSR. She is tolerating anticoagulation. Recent Hgb stable.  -Continue Eliquis 5 mg twice daily, Metoprolol tartrate 12.5 mg twice daily. -Follow up with EP as planned. Night sweats Etiology not clear. If cardiac workup unremarkable, would follow up with PCP for evaluation. Coronary artery calcification CAC score 16.6 (31st percentile) on pre ablation CT. She is not having chest pain but she is having dyspnea on exertion. Will get Myocardial PET to r/o anginal equivalent. Continue Atorvastatin 40 mg once daily.  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 As noted, echocardiogram will be obtained.     Informed Consent   Shared Decision Making/Informed Consent The risks [chest pain, shortness of breath, cardiac arrhythmias, dizziness, blood pressure fluctuations, myocardial infarction, stroke/transient ischemic attack, nausea, vomiting, allergic reaction, radiation exposure, metallic taste sensation and life-threatening complications (estimated to be 1 in 10,000)], benefits (risk stratification, diagnosing coronary artery disease, treatment guidance) and alternatives of a  cardiac PET stress test were discussed in detail with Ms. Roehr and she agrees to proceed.     Dispo:  Return in about 8 weeks (around 03/17/2023) for Follow up after testing w/ Dr. Allyson Sabal, or Tereso Newcomer, PA-C.  Signed, Tereso Newcomer, PA-C

## 2023-01-20 ENCOUNTER — Ambulatory Visit: Payer: Medicare Other | Attending: Physician Assistant | Admitting: Physician Assistant

## 2023-01-20 ENCOUNTER — Encounter: Payer: Self-pay | Admitting: Physician Assistant

## 2023-01-20 VITALS — BP 130/88 | HR 86 | Ht 66.0 in | Wt 232.8 lb

## 2023-01-20 DIAGNOSIS — R0602 Shortness of breath: Secondary | ICD-10-CM | POA: Insufficient documentation

## 2023-01-20 DIAGNOSIS — I2510 Atherosclerotic heart disease of native coronary artery without angina pectoris: Secondary | ICD-10-CM | POA: Diagnosis not present

## 2023-01-20 DIAGNOSIS — R61 Generalized hyperhidrosis: Secondary | ICD-10-CM | POA: Diagnosis not present

## 2023-01-20 DIAGNOSIS — I77810 Thoracic aortic ectasia: Secondary | ICD-10-CM | POA: Insufficient documentation

## 2023-01-20 DIAGNOSIS — I480 Paroxysmal atrial fibrillation: Secondary | ICD-10-CM | POA: Diagnosis not present

## 2023-01-20 NOTE — Assessment & Plan Note (Signed)
42 mm on CT in the past. As noted, echocardiogram will be obtained.

## 2023-01-20 NOTE — Patient Instructions (Signed)
Medication Instructions:  Your physician recommends that you continue on your current medications as directed. Please refer to the Current Medication list given to you today.  *If you need a refill on your cardiac medications before your next appointment, please call your pharmacy*   Lab Work: TODAY:  PRO BNP  If you have labs (blood work) drawn today and your tests are completely normal, you will receive your results only by: MyChart Message (if you have MyChart) OR A paper copy in the mail If you have any lab test that is abnormal or we need to change your treatment, we will call you to review the results.   Testing/Procedures: Your physician has requested that you have an echocardiogram. Echocardiography is a painless test that uses sound waves to create images of your heart. It provides your doctor with information about the size and shape of your heart and how well your heart's chambers and valves are working. This procedure takes approximately one hour. There are no restrictions for this procedure. Please do NOT wear cologne, perfume, aftershave, or lotions (deodorant is allowed). Please arrive 15 minutes prior to your appointment time.  Please note: We ask at that you not bring children with you during ultrasound (echo/ vascular) testing. Due to room size and safety concerns, children are not allowed in the ultrasound rooms during exams. Our front office staff cannot provide observation of children in our lobby area while testing is being conducted. An adult accompanying a patient to their appointment will only be allowed in the ultrasound room at the discretion of the ultrasound technician under special circumstances. We apologize for any inconvenience.     Please report to Radiology at the [facility] Main Entrance 30 minutes early for your test.  [address] [place], [place] [address]                         OR   Please report to Radiology at [facility] Main Entrance, medical mall, 30 mins prior to your test.  [address]  [place], [place]  [phone]  How to Prepare for Your Cardiac PET/CT Stress Test:   Medication instructions: Do not take nitrates (isosorbide mononitrate, Ranexa) the day before or day of test Do not take tamsulosin the day before or morning of test Hold theophylline containing medications for 12 hours. Hold Dipyridamole 48 hours prior to the test.     Diabetic Preparation: If able to eat breakfast prior to 3 hour fasting, you may take all medications, including your insulin. Do not worry if you miss your breakfast dose of insulin - start at your next meal. If you do not eat prior to 3 hour fast-Hold all diabetes (oral and insulin) medications. Patients who wear a continuous glucose monitor MUST remove the device prior to scanning.  You may take your remaining medications with water.  2. Nothing to eat or drink, except water, 3 hours prior to arrival time.   NO caffeine/decaffeinated products, or chocolate 12 hours prior to arrival. (Please note decaffeinated beverages (teas/coffees) still contain caffeine).  If you have caffeine within 12 hours prior, the test will need to be rescheduled.   3. NO perfume, cologne or lotion on chest or abdomen area. FEMALES - Please avoid wearing dresses to this appointment.  4. Total time is 1 to 2 hours; you may want to bring reading material for the waiting time.  IF YOU THINK YOU MAY  BE PREGNANT, OR ARE NURSING PLEASE INFORM THE TECHNOLOGIST.  In preparation for your appointment, medication and supplies will be purchased.  Appointment availability is limited, so if you need to cancel or reschedule, please call the Radiology Department at [phone] [facility]) OR [phone] [facility]) 24 hours in advance to avoid a cancellation fee of $100.00  What to Expect When you Arrive:  Once you arrive and check in for your appointment, you will be taken to  a preparation room within the Radiology Department.  A technologist or Nurse will obtain your medical history, verify that you are correctly prepped for the exam, and explain the procedure.  Afterwards, an IV will be started in your arm and electrodes will be placed on your skin for EKG monitoring during the stress portion of the exam. Then you will be escorted to the PET/CT scanner.  There, staff will get you positioned on the scanner and obtain a blood pressure and EKG.  During the exam, you will continue to be connected to the EKG and blood pressure machines.  A small, safe amount of a radioactive tracer will be injected in your IV to obtain a series of pictures of your heart along with an injection of a stress agent.    After your Exam:  It is recommended that you eat a meal and drink a caffeinated beverage to counter act any effects of the stress agent.  Drink plenty of fluids for the remainder of the day and urinate frequently for the first couple of hours after the exam.  Your doctor will inform you of your test results within 7-10 business days.  For more information and frequently asked questions, please visit our website: [email or web address]  For questions about your test or how to prepare for your test, please call: Cardiac Imaging Nurse Navigators Office: [phone]    Follow-Up: At [facility], you and your health needs are our priority.  As part of our continuing mission to provide you with exceptional heart care, we have created designated Provider Care Teams.  These Care Teams include your primary Cardiologist (physician) and Advanced Practice Providers (APPs -  Physician Assistants and Nurse Practitioners) who all work together to provide you with the care you need, when you need it.  We recommend signing up for the patient portal called "MyChart".  Sign up information is provided on this After Visit Summary.  MyChart is used to connect with patients for  Virtual Visits (Telemedicine).  Patients are able to view lab/test results, encounter notes, upcoming appointments, etc.  Non-urgent messages can be sent to your provider as well.   To learn more about what you can do with MyChart, go to [email or web address].    Your next appointment:   8-10  week(s)  Provider:   Nanetta Batty, MD  or Tereso Newcomer, PA-C    Other Instructions

## 2023-01-20 NOTE — Assessment & Plan Note (Signed)
Maintaining NSR. She is tolerating anticoagulation. Recent Hgb stable.  -Continue Eliquis 5 mg twice daily, Metoprolol tartrate 12.5 mg twice daily. -Follow up with EP as planned.

## 2023-01-20 NOTE — Assessment & Plan Note (Signed)
CAC score 16.6 (31st percentile) on pre ablation CT. She is not having chest pain but she is having dyspnea on exertion. Will get Myocardial PET to r/o anginal equivalent. Continue Atorvastatin 40 mg once daily.

## 2023-01-21 LAB — PRO B NATRIURETIC PEPTIDE: NT-Pro BNP: 219 pg/mL (ref 0–738)

## 2023-01-25 ENCOUNTER — Inpatient Hospital Stay: Payer: Medicare Other | Attending: Hematology and Oncology | Admitting: Hematology and Oncology

## 2023-01-25 VITALS — BP 144/72 | HR 94 | Temp 97.9°F | Resp 18 | Ht 66.0 in | Wt 231.5 lb

## 2023-01-25 DIAGNOSIS — Z79810 Long term (current) use of selective estrogen receptor modulators (SERMs): Secondary | ICD-10-CM | POA: Insufficient documentation

## 2023-01-25 DIAGNOSIS — D0512 Intraductal carcinoma in situ of left breast: Secondary | ICD-10-CM | POA: Diagnosis not present

## 2023-01-25 DIAGNOSIS — G4700 Insomnia, unspecified: Secondary | ICD-10-CM

## 2023-01-25 DIAGNOSIS — Z170 Estrogen receptor positive status [ER+]: Secondary | ICD-10-CM | POA: Diagnosis not present

## 2023-01-25 MED ORDER — TAMOXIFEN CITRATE 10 MG PO TABS: ORAL_TABLET | ORAL | 1 refills | Status: DC

## 2023-01-25 NOTE — Assessment & Plan Note (Signed)
01/06/2021:Diagnostic mammogram: indeterminate calcifications in the upper outer left breast. Biopsy: DCIS and necrosis and calcifications, ER+(100%)/PR+(70%).    02/27/2021: Left lumpectomy: Grade 2 DCIS 0.8 cm, margins negative, ER 100%, PR 70%    Treatment plan: 1. adjuvant radiation therapy to be completed 04/25/2021 2. Followed by antiestrogen therapy with tamoxifen 5 years started 04/30/2021   Tamoxifen toxicities: Severe hot flashes: we reduced the dosage of tamoxifen today to 10 mg at bedtime.   Breast cancer surveillance: Breast exam 01/25/2023: Benign Mammogram scheduled for 02/08/2023   Return to clinic in 1 year for follow-up

## 2023-01-25 NOTE — Progress Notes (Signed)
Patient Care Team: Shirline Frees, NP as PCP - General (Family Medicine) Runell Gess, MD as PCP - Cardiology (Cardiology) Lanier Prude, MD as PCP - Electrophysiology (Cardiology) Ihor Gully, MD (Inactive) as Consulting Physician (Urology) [facility] (Specialist) Verner Chol, [facility] (Inactive) as Pharmacist (Pharmacist) Donnelly Angelica, RN as Oncology Nurse Navigator Pershing Proud, RN as Oncology Nurse Navigator Almond Lint, MD as Consulting Physician (General Surgery) Serena Croissant, MD as Consulting Physician (Hematology and Oncology) Antony Blackbird, MD as Consulting Physician (Radiation Oncology)  DIAGNOSIS:  Encounter Diagnoses  Name Primary?   Ductal carcinoma in situ (DCIS) of left breast Yes   Insomnia, unspecified type     SUMMARY OF ONCOLOGIC HISTORY: Oncology History  Ductal carcinoma in situ (DCIS) of left breast  01/06/2021 Initial Diagnosis   Diagnostic mammogram: indeterminate calcifications in the upper outer left breast. Biopsy: DCIS and necrosis and calcifications, ER+(100%)/PR+(70%).    01/15/2021 Cancer Staging   Staging form: Breast, AJCC 8th Edition - Clinical stage from 01/15/2021: Stage 0 (cTis (DCIS), cN0, cM0, ER+, PR+, HER2: Not Assessed) - Signed by Serena Croissant, MD on 01/15/2021 Stage prefix: Initial diagnosis    Genetic Testing   [facility] CancerNext-Expanded Panel is Negative. Report date is 01/28/2021.  The CancerNext-Expanded gene panel offered by [facility] and includes sequencing, rearrangement, and RNA analysis for the following 77 genes: AIP, ALK, APC, ATM, AXIN2, BAP1, BARD1, BLM, BMPR1A, BRCA1, BRCA2, BRIP1, CDC73, CDH1, CDK4, CDKN1B, CDKN2A, CHEK2, CTNNA1, DICER1, FANCC, FH, FLCN, GALNT12, KIF1B, LZTR1, MAX, MEN1, MET, MLH1, MSH2, MSH3, MSH6, MUTYH, NBN, NF1, NF2, NTHL1, PALB2, PHOX2B, PMS2, POT1, PRKAR1A, PTCH1, PTEN, RAD51C, RAD51D, RB1, RECQL, RET, SDHA, SDHAF2, SDHB, SDHC, SDHD, SMAD4, SMARCA4, SMARCB1, SMARCE1,  STK11, SUFU, TMEM127, TP53, TSC1, TSC2, VHL and XRCC2 (sequencing and deletion/duplication); EGFR, EGLN1, HOXB13, KIT, MITF, PDGFRA, POLD1, and POLE (sequencing only); EPCAM and GREM1 (deletion/duplication only).    02/27/2021 Surgery   BREAST, LEFT, LUMPECTOMY:  - Ductal carcinoma in situ, 0.8 cm in maximal dimension.  - Tumor approaches to 0.5 cm of closest (deep) resection margin.    03/28/2021 - 04/23/2021 Radiation Therapy   Site Technique Total Dose (Gy) Dose per Fx (Gy) Completed Fx Beam Energies  Breast, Left: Breast_L 3D 40.05/40.05 2.67 15/15 10XFFF  Breast, Left: Breast_L_Bst 3D 10/10 2 5/5 6X, 10X     04/30/2021 -  Anti-estrogen oral therapy   Tamoxifen started 5 mg daily     CHIEF COMPLIANT: Follow-up on 5 mg of tamoxifen  HISTORY OF PRESENT ILLNESS:   History of Present Illness   The patient, with a history of breast cancer, presents two years post-diagnosis. She is currently on tamoxifen, which she takes half a pill of at night. She reports experiencing hot flashes almost every night between nine and ten. She also reports tenderness in the breast, which she was reassured is normal even two years post-diagnosis.  The patient also reports a fall a couple of weeks ago where she hit her head on bricks in her garage. She went to the hospital and was told she did not have a concussion, but she feels like she did.  In addition to these issues, the patient also has arthritis and is allergic to NSAIDs. She is currently taking a pain pill for her arthritis. She reports that her hands hurt and she can almost tell when it has been six hours since her last dose. 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  ALLERGIES:  is allergic to nsaids, otezla [apremilast], penicillins, erythromycin, morphine and codeine, penicillamine, remicade [infliximab], risankizumab, tolmetin, and nickel.  MEDICATIONS:  Current Outpatient Medications  Medication Sig Dispense Refill    acetaminophen (TYLENOL) 325 MG tablet Take 650 mg by mouth every 6 (six) hours as needed for moderate pain.     apixaban (ELIQUIS) 5 MG TABS tablet Take 1 tablet (5 mg total) by mouth 2 (two) times daily. 84 tablet    Ascorbic Acid (VITAMIN C) 1000 MG tablet Take 1,000 mg by mouth daily.     atorvastatin (LIPITOR) 40 MG tablet TAKE 1 TABLET BY MOUTH EVERY DAY 90 tablet 3   betamethasone dipropionate 0.05 % lotion APPLY TOPICALLY TWICE A DAY 60 mL 0   betamethasone, augmented, (DIPROLENE) 0.05 % lotion Apply topically 2 (two) times daily. 60 mL 0   HYDROcodone-acetaminophen (NORCO/VICODIN) 5-325 MG tablet Take 2 tablets by mouth every 4 (four) hours as needed for moderate pain or severe pain.     hydrocortisone cream 1 % Apply 1 application topically daily as needed for itching.     L-Methylfolate-B12-B6-B2 (CEREFOLIN) 07-17-48-5 MG TABS TAKE 1 TABLET BY MOUTH DAILY 90 tablet 0   losartan (COZAAR) 100 MG tablet Take 1 tablet (100 mg total) by mouth daily. 90 tablet 3   metoprolol tartrate (LOPRESSOR) 25 MG tablet TAKE 0.5 TABLETS BY MOUTH 2 TIMES DAILY. (Patient taking differently: Take 25 mg by mouth 2 (two) times daily.) 90 tablet 1   omeprazole (PRILOSEC) 40 MG capsule TAKE 1 CAPSULE BY MOUTH 2 (TWO) TIMES DAILY. TAKE 30 MINUTES BEFORE BREAKFAST & DINNER. 180 capsule 1   Soft Lens Products (REWETTING DROPS) SOLN Place 1 drop into both eyes daily as needed (Dry eyes).     sulfamethoxazole-trimethoprim (BACTRIM,SEPTRA) 400-80 MG tablet Take 1 tablet by mouth at bedtime. For urethritis.     tamoxifen (NOLVADEX) 10 MG tablet Take 1/2 tablet by mouth in the evening 90 tablet 1   traZODone (DESYREL) 50 MG tablet TAKE 1 TABLET BY MOUTH EVERYDAY AT BEDTIME 90 tablet 1   venlafaxine XR (EFFEXOR-XR) 75 MG 24 hr capsule Take 1 capsule (75 mg total) by mouth daily. 30 capsule 0   vitamin E 180 MG (400 UNITS) capsule Take 400 Units by mouth daily.     No current facility-administered medications for this  visit.    PHYSICAL EXAMINATION: ECOG PERFORMANCE STATUS: 1 - Symptomatic but completely ambulatory  Vitals:   01/25/23 1115  BP: (!) 144/72  Pulse: 94  Resp: 18  Temp: 97.9 F (36.6 C)  SpO2: 96%   Filed Weights   01/25/23 1115  Weight: 231 lb 8 oz (105 kg)    Physical Exam   BREAST: Tenderness in one breast.      (exam performed in the presence of a chaperone)  LABORATORY DATA:  I have reviewed the data as listed    Latest Ref Rng & Units 09/23/2022   11:06 AM 08/03/2022   11:16 AM 03/03/2022    4:26 PM  CMP  Glucose 70 - 99 mg/dL 409  811  99   BUN 8 - 27 mg/dL 12  10  8    Creatinine 0.57 - 1.00 mg/dL 9.14  7.82  9.56   Sodium 134 - 144 mmol/L 142  141  141   Potassium 3.5 - 5.2 mmol/L 4.1  3.7  4.2   Chloride 96 - 106 mmol/L 104  103  105   CO2 20 - 29 mmol/L 25  27  23   Calcium 8.7 - 10.3 mg/dL 9.0  9.3  9.3     Lab Results  Component Value Date   WBC 6.2 01/08/2023   HGB 11.6 (L) 01/08/2023   HCT 36.0 01/08/2023   MCV 103.4 (H) 01/08/2023   PLT 253 01/08/2023   NEUTROABS 4.5 01/08/2023    ASSESSMENT & PLAN:  Ductal carcinoma in situ (DCIS) of left breast 01/06/2021:Diagnostic mammogram: indeterminate calcifications in the upper outer left breast. Biopsy: DCIS and necrosis and calcifications, ER+(100%)/PR+(70%).    02/27/2021: Left lumpectomy: Grade 2 DCIS 0.8 cm, margins negative, ER 100%, PR 70%    Treatment plan: 1. adjuvant radiation therapy to be completed 04/25/2021 2. Followed by antiestrogen therapy with tamoxifen 5 years started 04/30/2021   Tamoxifen toxicities: Severe hot flashes: we reduced the dosage of tamoxifen today to 10 mg at bedtime.   Breast cancer surveillance: Breast exam 01/25/2023: Benign Mammogram scheduled for 02/08/2023    Arthritis Patient reports pain in hands, taking pain medication. Unable to use NSAIDs due to allergy. -Consider trying CBD oil for pain relief. -Suggested trying homemade olive oil and orange peel  remedy for anti-inflammatory properties.  Head Injury Patient reports a fall with head injury, experienced symptoms of concussion despite hospital report of no concussion. -No immediate action, monitor symptoms.  Follow-up in 1 year.       No orders of the defined types were placed in this encounter.  The patient has a good understanding of the overall plan. she agrees with it. she will call with any problems that may develop before the next visit here. Total time spent: 30 mins including face to face time and time spent for planning, charting and co-ordination of care   Tamsen Meek, MD 01/25/23

## 2023-02-03 ENCOUNTER — Encounter: Payer: Self-pay | Admitting: Adult Health

## 2023-02-03 ENCOUNTER — Ambulatory Visit (INDEPENDENT_AMBULATORY_CARE_PROVIDER_SITE_OTHER): Payer: Medicare Other | Admitting: Adult Health

## 2023-02-03 ENCOUNTER — Ambulatory Visit: Payer: Medicare Other | Admitting: Cardiology

## 2023-02-03 VITALS — BP 138/80 | Temp 98.2°F | Ht 66.0 in | Wt 232.0 lb

## 2023-02-03 DIAGNOSIS — R2681 Unsteadiness on feet: Secondary | ICD-10-CM | POA: Diagnosis not present

## 2023-02-03 DIAGNOSIS — H8113 Benign paroxysmal vertigo, bilateral: Secondary | ICD-10-CM | POA: Diagnosis not present

## 2023-02-03 MED ORDER — ONDANSETRON HCL 4 MG PO TABS: 4.0000 mg | ORAL_TABLET | Freq: Three times a day (TID) | ORAL | 0 refills | Status: DC | PRN

## 2023-02-03 MED ORDER — MECLIZINE HCL 25 MG PO TABS: 25.0000 mg | ORAL_TABLET | Freq: Three times a day (TID) | ORAL | 0 refills | Status: DC | PRN

## 2023-02-03 NOTE — Progress Notes (Signed)
Subjective:    Patient ID: Tonya Bartlett, female    DOB: 01-15-1946, 77 y.o.   MRN: [id number]  HPI 77 year old female who  has a past medical history of Alcohol abuse, Allergic rhinitis, Allergy, Anemia, Anxiety, Blood transfusion without reported diagnosis, Breast cancer (HCC) (12/2020), Cancer (HCC), Cataract, Clotting disorder (HCC), Colitis, Complication of anesthesia, Depression, Diverticulosis, Dysrhythmia, Fatigue, Frequency of urination, GERD (gastroesophageal reflux disease), History of colon polyps, History of DVT of lower extremity, History of hiatal hernia, History of iron deficiency anemia (1996), History of migraine headaches, History of radiation therapy, History of rib fracture, Hyperlipidemia, Hypertension, IBS (irritable bowel syndrome), Insomnia, Joint pain, MCI (mild cognitive impairment), Melanoma (HCC) (2019), Migraine headache, OA (osteoarthritis), Osteopenia, Pneumonia, PONV (postoperative nausea and vomiting), Recovering alcoholic (HCC), Right rotator cuff tear, SOB (shortness of breath) on exertion, Substance abuse (HCC), Swallowing difficulty, and Unspecified essential hypertension.  She presents to the office today for gait instability and dizziness. She was seen on 01/08/2023 in the ER for a fall at home where she fell down and hit her head on a brick wall. She is on Eliquis and thankfully her CT head was negative for acute bleeding. She reports that since that time she has fallen at home in her bedroom two more times. The last time being a week ago when she turned around too quickly and became dizzy falling to the ground. The time before that she was bending over to pet her dog and became dizzy causing her to fall to the ground. She denies LOC or head injury. She reports that constantly when she moves to quickly to change positions, bends over or lays down she becomes dizzy. She has not had any syncopal epsidoes.    Review of Systems See HPI   Past Medical History:   Diagnosis Date   Alcohol abuse    Sober 41 years.   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  1996   History of hiatal hernia    History of iron deficiency anemia 1996   iron infusion   History of migraine headaches    History of radiation therapy    left breast 03/27/2021-04/23/2021 Dr Antony Blackbird   History of rib fracture    Hyperlipidemia    Hypertension    IBS (irritable bowel syndrome)    Insomnia    Joint pain    MCI (mild cognitive impairment)    Melanoma (HCC) 2019   left leg    Migraine headache    hx of migraines    OA (osteoarthritis)    RIGHT SHOULDER   Osteopenia    Pneumonia    remote history   PONV (postoperative nausea and vomiting)    Recovering alcoholic (HCC)    SINCE 01-21-1980  Right rotator cuff tear    SOB (shortness of breath) on exertion    Substance abuse (HCC)    recovering alcoholic   Swallowing difficulty    Unspecified essential hypertension     Social History   Socioeconomic History   Marital status: Widowed    Spouse name: Not on file   Number of children: 3   Years of education: Not on file   Highest education level: Master's degree (e.g., MA, MS, MEng, MEd, MSW, MBA)  Occupational History   Occupation: retired Paramedic  Tobacco Use   Smoking status: Former    Current packs/day: 0.00    Average packs/day: 0.5 packs/day for 15.0 years (7.5 ttl pk-yrs)    Types: Cigarettes    Start date: 05/16/1970     Quit date: 05/15/1985    Years since quitting: 37.7   Smokeless tobacco: Never   Tobacco comments:    Former smoker 12/10/22  Vaping Use   Vaping status: Never Used  Substance and Sexual Activity   Alcohol use: No    Comment: RECOVERING ALCOHOLIC--  QUIT [id number]   Drug use: No   Sexual activity: Not Currently    Birth control/protection: Post-menopausal  Other Topics Concern   Not on file  Social History Narrative   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    Widowed    Three kids    6 grandchildren       Social Drivers of Corporate investment banker Strain: Low Risk  (04/18/2021)   Overall Financial Resource Strain (CARDIA)    Difficulty of Paying Living Expenses: Not hard at all  Food Insecurity: Low Risk  (09/04/2022)   Received from [facility]   Hunger Vital Sign    Worried About Running Out of Food in the Last Year: Never true    Ran Out of Food in the Last Year: Never true  Transportation Needs: Not on file (09/04/2022)  Physical Activity: Inactive (04/18/2021)   Exercise Vital Sign    Days of Exercise per Week: 0 days    Minutes of Exercise per Session: 0 min  Stress: No Stress Concern Present (04/18/2021)   [employer] of Occupational Health - Occupational Stress Questionnaire    Feeling of Stress : Not at all  Social Connections: Moderately Integrated (04/18/2021)   Social Connection and Isolation Panel [NHANES]    Frequency of Communication with Friends and Family: More than three times a week    Frequency of Social Gatherings with Friends and Family: More than three times a week    Attends Religious Services: More than 4 times per year    Active Member of [employer] or Organizations: Yes    Attends Banker Meetings: More than 4 times per year    Marital Status: Widowed  Intimate Partner Violence: Not At Risk (04/18/2021)   Humiliation, Afraid, Rape, and Kick questionnaire    Fear of Current or Ex-Partner: No     Emotionally Abused: No    Physically Abused: No    Sexually Abused: No    Past Surgical History:  Procedure Laterality Date   ATRIAL FIBRILLATION ABLATION N/A 10/13/2022   Procedure: ATRIAL FIBRILLATION ABLATION;  Surgeon: Lanier Prude, MD;  Location: [facility];  Service: Cardiovascular;  Laterality: N/A;   BREAST BIOPSY Left 01/06/2021   pos   BREAST LUMPECTOMY WITH RADIOACTIVE SEED LOCALIZATION Left 02/27/2021   Procedure: LEFT BREAST LUMPECTOMY WITH RADIOACTIVE SEED LOCALIZATION;  Surgeon:  Almond Lint, MD;  Location: [facility] OR;  Service: General;  Laterality: Left;   BUNIONECTOMY/  HAMMERTOE CORRECTION  RIGHT FOOT  2011   CATARACT EXTRACTION W/ INTRAOCULAR LENS  IMPLANT, BILATERAL     COLONOSCOPY     DILATION AND CURETTAGE OF UTERUS  1976   EYE SURGERY Bilateral 2016   cataract removal   KNEE ARTHROSCOPY W/ MENISCECTOMY Bilateral X2  LEFT /    X1  RIGHT   KNEE OPEN LATERAL RELEASE Bilateral    MOHS SURGERY Left 12/15/2017   Melanoma in situ - left calf - [facility]   ORIF ANKLE FRACTURE Right 08/04/2020   Procedure: OPEN REDUCTION INTERNAL FIXATION (ORIF) ANKLE FRACTURE;  Surgeon: Toni Arthurs, MD;  Location: [facility];  Service: Orthopedics;  Laterality: Right;  Mini C-arm, [employer] Small Frag   REPLACEMENT TOTAL KNEE Left 2006   SHOULDER ARTHROSCOPY WITH SUBACROMIAL DECOMPRESSION, ROTATOR CUFF REPAIR AND BICEP TENDON REPAIR Right 05/23/2013   Procedure: RIGHT SHOULDER ARTHROSCOPY EXAM UNDER ANESTHESIA  WITH SUBACROMIAL DECOMPRESSION,DISTAL CLAVICLE RESECTION, SADLABRAL DEBRIDEMENT CHONDROPLASTY, BICEP TENOTOMY ;  Surgeon: Eugenia Mcalpine, MD;  Location: [facility];  Service: Orthopedics;  Laterality: Right;   TONSILLECTOMY AND ADENOIDECTOMY  AGE 56   TOTAL HIP ARTHROPLASTY Left 05/04/2017   Procedure: LEFT TOTAL HIP ARTHROPLASTY ANTERIOR APPROACH;  Surgeon: Durene Romans, MD;  Location: [facility];  Service: Orthopedics;  Laterality: Left;   TOTAL  HIP ARTHROPLASTY Right 08/01/2019   Procedure: TOTAL HIP ARTHROPLASTY ANTERIOR APPROACH;  Surgeon: Durene Romans, MD;  Location: [facility];  Service: Orthopedics;  Laterality: Right;    TOTAL KNEE ARTHROPLASTY Bilateral LEFT  1996/   RIGHT 2004   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      UPPER GASTROINTESTINAL ENDOSCOPY     UPPER GI ENDOSCOPY     VAGINAL HYSTERECTOMY  1976    Family History  Problem Relation Age of Onset   Cancer Mother    Heart disease Mother    Hypertension Mother    Melanoma Mother 61   Obesity Mother    Heart disease Father 48   Hypertension Father    Esophageal cancer Brother    Dementia Paternal Grandfather    Melanoma Niece 19   Colon cancer Neg Hx    Colon polyps Neg Hx    Stomach cancer Neg Hx    Rectal cancer Neg Hx     Allergies  Allergen Reactions   Nsaids Other (See Comments) and Anaphylaxis    SEVERE STOMACH CRAMPS, MOUTH SORES  **Able to tolerate Tylenol   Otezla [Apremilast] Anaphylaxis    Suicidal ideation   Penicillins Anaphylaxis and Other (See Comments)    Has patient had a PCN reaction causing immediate rash, facial/tongue/throat swelling, SOB or lightheadedness with hypotension:  Yes  Has patient had a PCN reaction causing severe rash involving mucus membranes or skin necrosis: Yes  Has patient had a PCN reaction that required hospitalization: No  Has patient had a PCN reaction occurring within the last 10 year No  If all of the above answers are "NO", then may proceed with Cephalosporin use.   Erythromycin Other (See Comments)    SEVERE STOMACH CRAMPS   Morphine And Codeine Nausea And Vomiting   Penicillamine Other (See Comments)   Remicade [Infliximab] Other (See Comments)    Shut down immune system-BP high   Risankizumab Other (See Comments)    URI, fever, cough, UTI Skyrizi   Tolmetin     SEVERE STOMACH CRAMPS  Nickel Rash    Including snaps on hospital gowns     Current Outpatient Medications on File Prior to  Visit  Medication Sig Dispense Refill   acetaminophen (TYLENOL) 325 MG tablet Take 650 mg by mouth every 6 (six) hours as needed for moderate pain.     apixaban (ELIQUIS) 5 MG TABS tablet Take 1 tablet (5 mg total) by mouth 2 (two) times daily. 84 tablet    Ascorbic Acid (VITAMIN C) 1000 MG tablet Take 1,000 mg by mouth daily.     atorvastatin (LIPITOR) 40 MG tablet TAKE 1 TABLET BY MOUTH EVERY DAY 90 tablet 3   betamethasone dipropionate 0.05 % lotion APPLY TOPICALLY TWICE A DAY 60 mL 0   betamethasone, augmented, (DIPROLENE) 0.05 % lotion Apply topically 2 (two) times daily. 60 mL 0   HYDROcodone-acetaminophen (NORCO/VICODIN) 5-325 MG tablet Take 2 tablets by mouth every 4 (four) hours as needed for moderate pain or severe pain.     hydrocortisone cream 1 % Apply 1 application topically daily as needed for itching.     L-Methylfolate-B12-B6-B2 (CEREFOLIN) 07-17-48-5 MG TABS TAKE 1 TABLET BY MOUTH DAILY 90 tablet 0   losartan (COZAAR) 100 MG tablet Take 1 tablet (100 mg total) by mouth daily. 90 tablet 3   metoprolol tartrate (LOPRESSOR) 25 MG tablet TAKE 0.5 TABLETS BY MOUTH 2 TIMES DAILY. (Patient taking differently: Take 25 mg by mouth 2 (two) times daily.) 90 tablet 1   omeprazole (PRILOSEC) 40 MG capsule TAKE 1 CAPSULE BY MOUTH 2 (TWO) TIMES DAILY. TAKE 30 MINUTES BEFORE BREAKFAST & DINNER. 180 capsule 1   Soft Lens Products (REWETTING DROPS) SOLN Place 1 drop into both eyes daily as needed (Dry eyes).     sulfamethoxazole-trimethoprim (BACTRIM,SEPTRA) 400-80 MG tablet Take 1 tablet by mouth at bedtime. For urethritis.     tamoxifen (NOLVADEX) 10 MG tablet Take 1/2 tablet by mouth in the evening 90 tablet 1   traZODone (DESYREL) 50 MG tablet TAKE 1 TABLET BY MOUTH EVERYDAY AT BEDTIME 90 tablet 1   venlafaxine XR (EFFEXOR-XR) 75 MG 24 hr capsule Take 1 capsule (75 mg total) by mouth daily. 30 capsule 0   vitamin E 180 MG (400 UNITS) capsule Take 400 Units by mouth daily.     No current  facility-administered medications on file prior to visit.    BP 138/80   Temp 98.2 F (36.8 C) (Oral)   Ht 5\' 6"  (1.676 m)   Wt 232 lb (105.2 kg)   BMI 37.45 kg/m       Objective:   Physical Exam Vitals and nursing note reviewed.  Constitutional:      Appearance: Normal appearance.  Eyes:     General:        Right eye: No discharge.        Left eye: No discharge.     Extraocular Movements:     Right eye: Nystagmus (horizontal) present.     Left eye: Nystagmus (horizontal) present.  Cardiovascular:     Pulses: Normal pulses.     Heart sounds: Normal heart sounds.  Pulmonary:     Effort: Pulmonary effort is normal.     Breath sounds: Normal breath sounds.  Skin:    General: Skin is warm and dry.     Capillary Refill: Capillary refill takes less than 2 seconds.  Neurological:     General: No focal deficit present.     Mental Status: She is alert and oriented to person, place, and  time.     Cranial Nerves: Cranial nerves 2-12 are intact.     Sensory: Sensation is intact.     Motor: Motor function is intact.     Coordination: Coordination is intact.     Gait: Gait is intact.     Comments: She became symptomatic  when laying down and sitting up quickly. Symptoms resolved within 30 seconds   Psychiatric:        Mood and Affect: Mood normal.        Behavior: Behavior normal.        Thought Content: Thought content normal.        Judgment: Judgment normal.        Assessment & Plan:  1. Benign paroxysmal positional vertigo due to bilateral vestibular disorder (Primary) - Symptoms consistent with BPPV. No signs of CVA. Will prescribe Meclizine and Zofran as well as refer to PT  - Ambulatory referral to Physical Therapy - meclizine (ANTIVERT) 25 MG tablet; Take 1 tablet (25 mg total) by mouth 3 (three) times daily as needed for dizziness.  Dispense: 30 tablet; Refill: 0 - ondansetron (ZOFRAN) 4 MG tablet; Take 1 tablet (4 mg total) by mouth every 8 (eight) hours as needed  for nausea or vomiting.  Dispense: 20 tablet; Refill: 0  Shirline Frees, NP

## 2023-02-04 NOTE — Progress Notes (Signed)
Cardiology Office Note:  .   Date:  02/04/2023  ID:  Tonya Bartlett, DOB Jun 20, 1945, MRN [id number] PCP: Shirline Frees, NP  [facility] Providers Cardiologist:  Nanetta Batty, MD Electrophysiologist:  Lanier Prude, MD {  History of Present Illness: .   Tonya Bartlett is a 77 y.o. female w/PMHx of breast Ca (hx of XRT), HTN, AFib, hx of DVT  Referred to Dr. Lalla Brothers May 2024 for rhythm control management starategies as well as discussion of watchman given cost of her OAC.  Despite escalating dosing of her BB continued to have symptomatic Afib episodes Discussed both PVI/afib ablation as well as LAA occusion procedure, she wished to proceed with both  Saw the AFib clinic as usual, reported some elevated HRs but not clearly her symptoms of Afib  ER visit after a trip/fall 01/08/23, did hit her head, CT was OK  Raynelle Dick, PA-C 01/20/23, c/o DOE, noted at her ER visit possibly some findings of increased vascular prominence, poss p.HTN. some concerns her DOE could be an anginal equivalent, not overtly felt to be volume OL Planned for echo and stress PET given coronary Ca++ Maintaining SR   Today's visit is scheduled as her 3 mo post AFib ablation visit  ROS:   She reports + palpitations though the are much less frequent and much shorter in duration, lasting seconds. She has a watch that has alerted her to resting HR >100, associated with her palpitations She has not made an ECG tracing  + DOE No rest SOB, getting unusually winded since her ablation procedure, reportes that walking up the few steps into the house from the garage she has to stop 1/2 down the hall to catch her breath She is clear this is new No CP  No bleeding or signs of bleeding No near syncope or syncope  She has fallen and had trauma, she is not fainting, recalls/is awake for the fall Blames her vertigo Or once was bending forward to pet her dog and fell forward unable to gain her balance  back This is particularly worse in the mornings And + symptoms of orthostatic dizziness worse I the mornings as well  No groin concerns   Arrhythmia/AAD hx Afib diagnosed Dec 2022 PVI ablation 10/13/22  Studies Reviewed: Marland Kitchen    EKG done today and reviewed by myself:  SR 64bpm, normal EKG   10/13/22: EPS/ablation CONCLUSIONS: 1. Successful PVI 2. Successful ablation/isolation of the posterior wall 3. Intracardiac echo reveals trivial pericardial effusion, large accessory vein exiting the posterior wall adjacent to the right-sided carina 4. No early apparent complications. 5. Colchicine 0.6mg  PO BID x 5 days 6. Protonix 40mg  PO daily x 45 days  12/06/20: TTE 1. Left ventricular ejection fraction by 3D volume is 73 %. The left  ventricle has hyperdynamic function. The left ventricle has no regional  wall motion abnormalities. Left ventricular diastolic parameters are  consistent with Grade I diastolic  dysfunction (impaired relaxation). The average left ventricular global  longitudinal strain is -25.1 %. The global longitudinal strain is normal.   2. Right ventricular systolic function is normal. The right ventricular  size is normal.   3. Left atrial size was mildly dilated.   4. The mitral valve is normal in structure. Mild mitral valve  regurgitation. No evidence of mitral stenosis.   5. The aortic valve is tricuspid. There is mild calcification of the  aortic valve. There is mild thickening of the aortic valve. Aortic valve  regurgitation is not visualized. Mild to moderate aortic valve  sclerosis/calcification is present, without any  evidence of aortic stenosis.   6. The inferior vena cava is normal in size with greater than 50%  respiratory variability, suggesting right atrial pressure of 3 mmHg.    Risk Assessment/Calculations:    Physical Exam:   VS:  There were no vitals taken for this visit.   Wt Readings from Last 3 Encounters:  02/03/23 232 lb (105.2 kg)   01/25/23 231 lb 8 oz (105 kg)  01/20/23 232 lb 12.8 oz (105.6 kg)    GEN: Well nourished, well developed in no acute distress NECK: No JVD; No carotid bruits CARDIAC: RRR, no murmurs, no rubs, gallops, heart sounds are not distant RESPIRATORY:  CTA b/l without rales, wheezing or rhonchi  ABDOMEN: Soft, non-tender, non-distended EXTREMITIES:  No edema; No deformity    ASSESSMENT AND PLAN: .    paroxysmal AFib CHA2DS2Vsc is 6, on eliquis, appropiately dosed Unclear burden post ablation + palpitations though very brief Asked to try and make tracings with her palpitations, symptoms and send then to [name] If needed we can order a monitor  Post ablation DOE Exam/BP/EKG do not suggest effusion Will get CT chest using PV protocol Gen cards pending a stress PET via them (minimal coronary Ca++ on her pre-procedure CT  Falls, typically uses a cane that helps her but she lost it  She was told she can not have LAA occlusion device given her nickel allergy  HTN Looks good  Secondary hypercoagulable state 2/2 AFib    Dispo: back with EP in 2-3 mo, sooner if needed.     Signed, Sheilah Pigeon, PA-C

## 2023-02-05 ENCOUNTER — Ambulatory Visit: Payer: Medicare Other | Attending: Physician Assistant | Admitting: Physician Assistant

## 2023-02-05 ENCOUNTER — Encounter: Payer: Self-pay | Admitting: Physician Assistant

## 2023-02-05 VITALS — BP 121/70 | HR 64 | Ht 66.0 in | Wt 233.0 lb

## 2023-02-05 DIAGNOSIS — I480 Paroxysmal atrial fibrillation: Secondary | ICD-10-CM | POA: Insufficient documentation

## 2023-02-05 DIAGNOSIS — Z8679 Personal history of other diseases of the circulatory system: Secondary | ICD-10-CM | POA: Diagnosis not present

## 2023-02-05 DIAGNOSIS — I10 Essential (primary) hypertension: Secondary | ICD-10-CM | POA: Insufficient documentation

## 2023-02-05 DIAGNOSIS — R0602 Shortness of breath: Secondary | ICD-10-CM | POA: Insufficient documentation

## 2023-02-05 DIAGNOSIS — Z9889 Other specified postprocedural states: Secondary | ICD-10-CM | POA: Insufficient documentation

## 2023-02-05 DIAGNOSIS — D6869 Other thrombophilia: Secondary | ICD-10-CM | POA: Diagnosis not present

## 2023-02-05 NOTE — Patient Instructions (Addendum)
Medication Instructions:   Your physician recommends that you continue on your current medications as directed. Please refer to the Current Medication list given to you today.   *If you need a refill on your cardiac medications before your next appointment, please call your pharmacy*   Lab Work:  NONE ORDERED  TODAY    If you have labs (blood work) drawn today and your tests are completely normal, you will receive your results only by: MyChart Message (if you have MyChart) OR A paper copy in the mail If you have any lab test that is abnormal or we need to change your treatment, we will call you to review the results.   Testing/Procedures:  Non-Cardiac CT scanning, (CAT scanning), is a noninvasive, special x-ray that produces cross-sectional images of the body using x-rays and a computer. CT scans help physicians diagnose and treat medical conditions. For some CT exams, a contrast material is used to enhance visibility in the area of the body being studied. CT scans provide greater clarity and reveal more details than regular x-ray exams.     Follow-Up: At [facility], you and your health needs are our priority.  As part of our continuing mission to provide you with exceptional heart care, we have created designated Provider Care Teams.  These Care Teams include your primary Cardiologist (physician) and Advanced Practice Providers (APPs -  Physician Assistants and Nurse Practitioners) who all work together to provide you with the care you need, when you need it.  We recommend signing up for the patient portal called "MyChart".  Sign up information is provided on this After Visit Summary.  MyChart is used to connect with patients for Virtual Visits (Telemedicine).  Patients are able to view lab/test results, encounter notes, upcoming appointments, etc.  Non-urgent messages can be sent to your provider as well.   To learn more about what you can do with MyChart, go to  [email or web address].    Your next appointment:    2 month(s) ( CONTACT  CASSIE HALL/ ANGELINE HAMMER FOR EP SCHEDULING ISSUES )   Provider:    Steffanie Dunn, MD or Francis Dowse, PA-C    Other Instructions

## 2023-02-08 ENCOUNTER — Ambulatory Visit
Admission: RE | Admit: 2023-02-08 | Discharge: 2023-02-08 | Disposition: A | Discharge status: Home / Self Care | Payer: Medicare Other | Source: Ambulatory Visit | Attending: Adult Health | Admitting: Adult Health

## 2023-02-08 DIAGNOSIS — Z9889 Other specified postprocedural states: Secondary | ICD-10-CM

## 2023-02-08 DIAGNOSIS — Z853 Personal history of malignant neoplasm of breast: Secondary | ICD-10-CM | POA: Diagnosis not present

## 2023-02-08 HISTORY — DX: Personal history of irradiation: Z92.3

## 2023-02-09 ENCOUNTER — Telehealth (HOSPITAL_COMMUNITY): Payer: Self-pay | Admitting: *Deleted

## 2023-02-09 NOTE — Telephone Encounter (Signed)
Patient LVM  Stated medication was decreased venlafaxine XR (EFFEXOR-XR) 75 MG 24 hr capsule    && that she's SO Depressed & requested to go back on the  150 mg  venlafaxine XR [facility])  [id number] [place], [place] - [address]

## 2023-02-10 ENCOUNTER — Other Ambulatory Visit: Payer: Self-pay | Admitting: Adult Health

## 2023-02-10 DIAGNOSIS — G4700 Insomnia, unspecified: Secondary | ICD-10-CM

## 2023-02-11 MED ORDER — VENLAFAXINE HCL ER 150 MG PO CP24: 150.0000 mg | ORAL_CAPSULE | Freq: Every day | ORAL | 0 refills | Status: DC

## 2023-02-11 NOTE — Addendum Note (Signed)
Addended by: Thresa Ross on: 02/11/2023 10:00 AM   Modules accepted: Orders

## 2023-02-11 NOTE — Telephone Encounter (Signed)
Medication management - Message left for patient that Dr. Gilmore Laroche had increased her Venlafaxine XR to 150 mg, one a day and sent this in for her to [facility] [address]. Requested patient call us back if any issues taking or concerns.

## 2023-02-16 ENCOUNTER — Ambulatory Visit: Payer: Medicare Other

## 2023-02-16 DIAGNOSIS — N3020 Other chronic cystitis without hematuria: Secondary | ICD-10-CM | POA: Diagnosis not present

## 2023-02-16 DIAGNOSIS — R102 Pelvic and perineal pain: Secondary | ICD-10-CM | POA: Diagnosis not present

## 2023-02-19 ENCOUNTER — Ambulatory Visit (HOSPITAL_COMMUNITY)
Admission: RE | Admit: 2023-02-19 | Discharge: 2023-02-19 | Disposition: A | Discharge status: Home / Self Care | Payer: Medicare Other | Source: Ambulatory Visit | Attending: Physician Assistant | Admitting: Physician Assistant

## 2023-02-19 DIAGNOSIS — Z9889 Other specified postprocedural states: Secondary | ICD-10-CM | POA: Insufficient documentation

## 2023-02-19 DIAGNOSIS — Q2546 Tortuous aortic arch: Secondary | ICD-10-CM | POA: Diagnosis not present

## 2023-02-19 DIAGNOSIS — Z8679 Personal history of other diseases of the circulatory system: Secondary | ICD-10-CM | POA: Diagnosis not present

## 2023-02-19 DIAGNOSIS — R918 Other nonspecific abnormal finding of lung field: Secondary | ICD-10-CM | POA: Diagnosis not present

## 2023-02-19 DIAGNOSIS — R0602 Shortness of breath: Secondary | ICD-10-CM | POA: Insufficient documentation

## 2023-02-22 ENCOUNTER — Ambulatory Visit: Payer: Medicare Other | Attending: Adult Health

## 2023-02-22 ENCOUNTER — Other Ambulatory Visit: Payer: Self-pay

## 2023-02-22 DIAGNOSIS — H8113 Benign paroxysmal vertigo, bilateral: Secondary | ICD-10-CM | POA: Diagnosis not present

## 2023-02-22 DIAGNOSIS — R42 Dizziness and giddiness: Secondary | ICD-10-CM | POA: Insufficient documentation

## 2023-02-22 DIAGNOSIS — M542 Cervicalgia: Secondary | ICD-10-CM | POA: Diagnosis not present

## 2023-02-22 DIAGNOSIS — R2681 Unsteadiness on feet: Secondary | ICD-10-CM | POA: Diagnosis not present

## 2023-02-22 NOTE — Therapy (Signed)
 OUTPATIENT PHYSICAL THERAPY VESTIBULAR EVALUATION     Patient Name: Tonya Bartlett MRN: [id number] DOB:1945-09-16, 78 y.o., female Today's Date: 02/22/2023  END OF SESSION:  PT End of Session - 02/22/23 1116     Visit Number 1    Number of Visits 8    Date for PT Re-Evaluation 03/22/23    Authorization Type Medicare A/B    Progress Note Due on Visit 10    PT Start Time 1115    PT Stop Time 1200    PT Time Calculation (min) 45 min             Past Medical History:  Diagnosis Date   Alcohol abuse    Sober 41 years.   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  1996   History of hiatal hernia    History of iron deficiency anemia 1996   iron infusion   History of migraine headaches    History of radiation therapy    left breast 03/27/2021-04/23/2021 Dr Lynwood Nasuti   History of rib fracture    Hyperlipidemia    Hypertension    IBS (irritable bowel syndrome)    Insomnia    Joint pain    MCI (mild cognitive impairment)    Melanoma (HCC) 2019   left leg    Migraine headache    hx of migraines    OA (osteoarthritis)    RIGHT SHOULDER   Osteopenia    Personal history of radiation therapy    Pneumonia    remote history   PONV (postoperative nausea and vomiting)    Recovering alcoholic (HCC)    SINCE 01-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   Right rotator cuff tear    SOB (shortness of breath) on exertion    Substance abuse (HCC)    recovering alcoholic   Swallowing  difficulty    Unspecified essential hypertension    Past Surgical History:  Procedure Laterality Date   ATRIAL FIBRILLATION ABLATION N/A 10/13/2022   Procedure: ATRIAL FIBRILLATION ABLATION;  Surgeon: Cindie Ole DASEN, MD;  Location: [facility];  Service: Cardiovascular;  Laterality: N/A;   BREAST BIOPSY Left 01/06/2021   pos   BREAST LUMPECTOMY     BREAST LUMPECTOMY WITH RADIOACTIVE SEED LOCALIZATION Left 02/27/2021   Procedure: LEFT BREAST LUMPECTOMY WITH RADIOACTIVE SEED LOCALIZATION;  Surgeon: Aron Shoulders, MD;  Location: [facility];  Service: General;  Laterality: Left;   BUNIONECTOMY/  HAMMERTOE CORRECTION  RIGHT FOOT  2011   CATARACT EXTRACTION W/ INTRAOCULAR LENS  IMPLANT, BILATERAL     COLONOSCOPY     DILATION AND CURETTAGE OF UTERUS  1976   EYE SURGERY Bilateral 2016   cataract removal   KNEE ARTHROSCOPY W/ MENISCECTOMY Bilateral X2  LEFT /    X1  RIGHT  KNEE OPEN LATERAL RELEASE Bilateral    MOHS SURGERY Left 12/15/2017   Melanoma in situ - left calf - [facility]   ORIF ANKLE FRACTURE Right 08/04/2020   Procedure: OPEN REDUCTION INTERNAL FIXATION (ORIF) ANKLE FRACTURE;  Surgeon: Kit Rush, MD;  Location: [facility];  Service: Orthopedics;  Laterality: Right;  Mini C-arm, [employer] Small Frag   REPLACEMENT TOTAL KNEE Left 2006   SHOULDER ARTHROSCOPY WITH SUBACROMIAL DECOMPRESSION, ROTATOR CUFF REPAIR AND BICEP TENDON REPAIR Right 05/23/2013   Procedure: RIGHT SHOULDER ARTHROSCOPY EXAM UNDER ANESTHESIA  WITH SUBACROMIAL DECOMPRESSION,DISTAL CLAVICLE RESECTION, SADLABRAL DEBRIDEMENT CHONDROPLASTY, BICEP TENOTOMY ;  Surgeon: Lamar Collet, MD;  Location: [facility] [address] [place];  Service: Orthopedics;  Laterality: Right;   TONSILLECTOMY AND ADENOIDECTOMY  AGE 39   TOTAL HIP ARTHROPLASTY Left 05/04/2017   Procedure: LEFT TOTAL HIP ARTHROPLASTY ANTERIOR APPROACH;  Surgeon: Ernie Cough, MD;  Location: [facility];  Service: Orthopedics;  Laterality: Left;   TOTAL  HIP ARTHROPLASTY Right 08/01/2019   Procedure: TOTAL HIP ARTHROPLASTY ANTERIOR APPROACH;  Surgeon: Ernie Cough, MD;  Location: [facility];  Service: Orthopedics;  Laterality: Right;    TOTAL KNEE ARTHROPLASTY Bilateral LEFT  1996/   RIGHT 2004   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      UPPER GASTROINTESTINAL ENDOSCOPY     UPPER GI ENDOSCOPY     VAGINAL HYSTERECTOMY  1976   Patient Active Problem List   Diagnosis Date Noted   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   Hypercoagulable state due to paroxysmal atrial fibrillation (HCC) 12/10/2022   BMI 33.0-33.9,adult 07/22/2022   BMI 32.0-32.9,adult 04/09/2022   Obesity, Beginning BMI 36.94 04/09/2022   B12 deficiency 01/29/2022   Pain of toe of right foot 12/25/2021   Pre-diabetes 11/05/2021   Essential hypertension 11/05/2021   Left knee pain 11/05/2021   Coronary artery calcification 08/07/2021   Vitamin D  deficiency 07/29/2021   Insulin  resistance 07/17/2021   Dysphagia 03/21/2021   Gastroesophageal reflux disease 03/21/2021   History of colon polyps 03/21/2021   Genetic testing 01/31/2021   Aortic atherosclerosis (HCC) 01/17/2021   Family history of melanoma 01/16/2021   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   PAF (paroxysmal atrial fibrillation) (HCC) 10/22/2020   Family history of heart disease 10/22/2020   Trimalleolar fracture 07/31/2020   Right hip OA 08/01/2019   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   Insomnia 02/03/2019   Onychomycosis 11/28/2018   Obese 05/05/2017   S/P left THA, 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   MCI (mild cognitive impairment) 09/19/2015   Memory deficit 09/19/2015   SIRS (systemic inflammatory response syndrome) (HCC) 10/29/2014   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   Depression 11/16/2013   ADD (attention deficit disorder) 11/17/2011   Essential hypertension, benign 10/15/2009   SEBORRHEIC KERATOSIS, INFLAMED 10/17/2008   CARPAL TUNNEL SYNDROME, LEFT 06/22/2008    Allergic rhinitis 08/30/2007   Hyperlipidemia 11/11/2006   Migraine 11/11/2006   Osteoarthritis 11/11/2006    PCP: Merna Huxley, NP REFERRING PROVIDER: Merna Huxley, NP  REFERRING DIAG: H81.13 (ICD-10-CM) - Benign paroxysmal positional vertigo due to bilateral vestibular disorder  THERAPY DIAG:  Dizziness and giddiness  Unsteadiness on feet  ONSET DATE: 01/08/23  Rationale for Evaluation and Treatment: Rehabilitation  SUBJECTIVE:   SUBJECTIVE STATEMENT: Fell on 01/08/23 and struck head on left side of head and has been dizzy and lethargic since that time.  Worse when first arising in the AM and staggering/imbalanced. Episodes last a few seconds and  able to recover.  Denies any photo/phonophobia, HA, and sporadically experiences migraine.  Notes her memory has been worse since fall and her neck has been troublesome since having this fall.   Pt accompanied by: self  PERTINENT HISTORY: 78 year old female who  has a past medical history of Alcohol abuse, Allergic rhinitis, Allergy, Anemia, Anxiety, Blood transfusion without reported diagnosis, Breast cancer (HCC) (12/2020), Cancer (HCC), Cataract, Clotting disorder (HCC), Colitis, Complication of anesthesia, Depression, Diverticulosis, Dysrhythmia, Fatigue, Frequency of urination, GERD (gastroesophageal reflux disease), History of colon polyps, History of DVT of lower extremity, History of hiatal hernia, History of iron deficiency anemia (1996), History of migraine headaches, History of radiation therapy, History of rib fracture, Hyperlipidemia, Hypertension, IBS (irritable bowel syndrome), Insomnia, Joint pain, MCI (mild cognitive impairment), Melanoma (HCC) (2019), Migraine headache, OA (osteoarthritis), Osteopenia, Pneumonia, PONV (postoperative nausea and vomiting), Recovering alcoholic (HCC), Right rotator cuff tear, SOB (shortness of breath) on exertion, Substance abuse (HCC), Swallowing difficulty, and Unspecified essential  hypertension.  She was seen on 01/08/2023 in the ER for a fall at home where she fell down and hit her head on a brick wall. She is on Eliquis  and thankfully her CT head was negative for acute bleeding. She reports that since that time she has fallen at home in her bedroom two more times. The last time being a week ago when she turned around too quickly and became dizzy falling to the ground. The time before that she was bending over to pet her dog and became dizzy causing her to fall to the ground. She denies LOC or head injury. She reports that constantly when she moves to quickly to change positions, bends over or lays down she becomes dizzy. She has not had any syncopal epsidoes.   PAIN:  Are you having pain? Yes: NPRS scale: 4/10 Pain location: neck L > R Pain description: aches, sore, limited with movement Aggravating factors: extension, rotation, lateral flexion Relieving factors: rest, heat  PRECAUTIONS: Fall  RED FLAGS: None   WEIGHT BEARING RESTRICTIONS: No  FALLS: Has patient fallen in last 6 months? Yes. Number of falls 3-4  LIVING ENVIRONMENT: Lives with: lives alone Lives in: House/apartment Stairs: Yes: External: 4 steps; bilateral but cannot reach both Has following equipment at home: Single point cane  PLOF: Independent  PATIENT GOALS: eliminate symptoms  OBJECTIVE:  Note: Objective measures were completed at Evaluation unless otherwise noted.  DIAGNOSTIC FINDINGS: had imaging at hospital to rule out intracranial issues, negative results  COGNITION: Overall cognitive status: Within functional limits for tasks assessed   SENSATION: Not tested  EDEMA:  None noted    POSTURE:  No Significant postural limitations  Cervical ROM:    Active A/PROM (deg) eval  Flexion 45  Extension 45 w/ left side neck pain  Right lateral flexion 20 w/ referred symptoms right scap  Left lateral flexion 30  Right rotation 35  Left rotation 35  (Blank rows = not  tested)    BED MOBILITY:  indep  TRANSFERS: Assistive device utilized: None  Sit to stand: Complete Independence and Modified independence Stand to sit: Complete Independence Chair to chair: Complete Independence Floor:  NT    CURB: NT  GAIT: Gait pattern:  unsteady, lateral LOB Distance walked:  Assistive device utilized: None Level of assistance: Complete Independence Comments:     PATIENT SURVEYS:  FOTO 39 (predicted 56)  VESTIBULAR ASSESSMENT:  GENERAL OBSERVATION: recent hx of left scalp laceration from fall/striking head   SYMPTOM BEHAVIOR:  Subjective history: fell striking head 11/22 and experiencing positional dizziness since tha ttime  Non-Vestibular symptoms: neck pain  Type of dizziness: Spinning/Vertigo  Frequency: daily/positional changes  Duration: seconds-minutes  Aggravating factors: Induced by position change: rolling to the right, rolling to the left, supine to sit, and sit to stand and Induced by motion: looking up at the ceiling, bending down to the ground, turning body quickly, turning head quickly, and activity in general  Relieving factors: head stationary and slow movements  Progression of symptoms: unchanged  OCULOMOTOR EXAM:  Ocular Alignment: normal  Ocular ROM: No Limitations  Spontaneous Nystagmus: absent  Gaze-Induced Nystagmus: absent  Smooth Pursuits: intact  Saccades: hypometric/undershoots  Convergence/Divergence: 3 cm   No provocation of symptoms with oculomotor or VOR exam   VESTIBULAR - OCULAR REFLEX:   Slow VOR: Normal  VOR Cancellation: Normal  Head-Impulse Test: HIT Right: negative HIT Left: negative  Dynamic Visual Acuity: Not able to be assessed   POSITIONAL TESTING: Right Dix-Hallpike: upbeating, right nystagmus Left Dix-Hallpike: upbeating, left nystagmus Right Roll Test: no nystagmus Left Roll Test: geotropic nystagmus  MOTION SENSITIVITY:  Motion Sensitivity Quotient Intensity: 0 = none, 1 =  Lightheaded, 2 = Mild, 3 = Moderate, 4 = Severe, 5 = Vomiting  Intensity  1. Sitting to supine   2. Supine to L side   3. Supine to R side   4. Supine to sitting   5. L Hallpike-Dix   6. Up from L    7. R Hallpike-Dix   8. Up from R    9. Sitting, head tipped to L knee   10. Head up from L knee   11. Sitting, head tipped to R knee   12. Head up from R knee   13. Sitting head turns x5   14.Sitting head nods x5   15. In stance, 180 turn to L    16. In stance, 180 turn to R     OTHOSTATICS: not done  FUNCTIONAL GAIT: Functional gait assessment: TBD M-CTSIB: TBD                                                                                                                            TREATMENT DATE: 02/21/22   Canalith Repositioning:  Epley Right: Number of Reps: 2 Gaze Adaptation:   Habituation:   Other:   PATIENT EDUCATION: Education details: assessment details, rationale of intervention, indication for f/u Person educated: Patient Education method: Explanation Education comprehension: verbalized understanding  HOME EXERCISE PROGRAM:  GOALS: Goals reviewed with patient? Yes  SHORT TERM GOALS: Target date: same as LTG    LONG TERM GOALS: Target date: 03/22/2023    Patient will be independent in HEP to improve functional outcomes Baseline:  Goal status: INITIAL  2.  Pt to be free of positional dizziness to improve balance and reduce risk for falls Baseline: +R/L Dixhallpike (upbeating respectively); + left Roll test w/ geotropic Goal status: INITIAL  3.  Pt  to report improved symptoms and greater activity participation per meeting FOTO requirements (56) Baseline: 39 Goal status: INITIAL  4.  Demo low risk for falls per score 24/30 Functional Gait Assessment to improve safety with community mobility Baseline: TBD Goal status: INITIAL  5.  Pt to report neck pain not exceeding 2/10 during functional tasks to improve activity tolerance   Baseline:  4/19  Goal status: INITIAL  ASSESSMENT:  CLINICAL IMPRESSION: Patient is a 78 y.o. lady who was seen today for physical therapy evaluation and treatment for dizziness and BPPV.  Unfortunately demonstrates provocative positional tests that imply multi-canal involvement BPPV as right/left Dix-Hallpike positions reveal upbeating nystagmus in respective directions as well as left geotropic nystagmus in the left Roll Test position.  Initiated treatment with right Epley maneuver and tolerated two repetitions within session, but unable to re-test due to time constraints.  Patient notes neck pain has also been worse since recent fall and exhibits ROM restrictions and pain with end-range movements of cervical spine with prominent trigger points along left cervical column/scapula.  Pt would benefit from PT services to address dizziness and unsteadiness as well as provide treatment interventions for neck pain and ROM limitations  OBJECTIVE IMPAIRMENTS: Abnormal gait, decreased activity tolerance, decreased balance, difficulty walking, decreased ROM, dizziness, increased muscle spasms, and pain.   ACTIVITY LIMITATIONS: carrying, lifting, bending, stairs, transfers, bed mobility, reach over head, and locomotion level  PARTICIPATION LIMITATIONS: meal prep, cleaning, laundry, interpersonal relationship, shopping, yard work, and exercise routine/class  PERSONAL FACTORS: Time since onset of injury/illness/exacerbation and 1-2 comorbidities: PMH/ recent hx of head trauma  are also affecting patient's functional outcome.   REHAB POTENTIAL: Excellent  CLINICAL DECISION MAKING: Evolving/moderate complexity  EVALUATION COMPLEXITY: Moderate   PLAN:  PT FREQUENCY: 1-2x/week  PT DURATION: 4 weeks  PLANNED INTERVENTIONS: 97110-Therapeutic exercises, 97530- Therapeutic activity, [id number]- Neuromuscular re-education, 97535- Self Care, [id number]- Manual therapy, [phone]- Gait training, [phone]- Canalith repositioning, [id number]-  Aquatic Therapy, [id number]- Electrical stimulation (unattended), Patient/Family education, Balance training, Stair training, Taping, Dry Needling, Joint mobilization, Spinal mobilization, Vestibular training, DME instructions, Cryotherapy, and Moist heat  PLAN FOR NEXT SESSION: re-assess positional dizziness (multi-canal/ear involvement)   12:31 PM, 02/22/23 M. Kelly Kentavius Dettore, PT, DPT Physical Therapist-  Office Number: [phone]

## 2023-02-23 DIAGNOSIS — D1801 Hemangioma of skin and subcutaneous tissue: Secondary | ICD-10-CM | POA: Diagnosis not present

## 2023-02-23 DIAGNOSIS — C44319 Basal cell carcinoma of skin of other parts of face: Secondary | ICD-10-CM | POA: Diagnosis not present

## 2023-02-23 DIAGNOSIS — L578 Other skin changes due to chronic exposure to nonionizing radiation: Secondary | ICD-10-CM | POA: Diagnosis not present

## 2023-02-23 DIAGNOSIS — D485 Neoplasm of uncertain behavior of skin: Secondary | ICD-10-CM | POA: Diagnosis not present

## 2023-02-23 DIAGNOSIS — L814 Other melanin hyperpigmentation: Secondary | ICD-10-CM | POA: Diagnosis not present

## 2023-02-23 DIAGNOSIS — D229 Melanocytic nevi, unspecified: Secondary | ICD-10-CM | POA: Diagnosis not present

## 2023-02-23 DIAGNOSIS — L570 Actinic keratosis: Secondary | ICD-10-CM | POA: Diagnosis not present

## 2023-02-23 DIAGNOSIS — L821 Other seborrheic keratosis: Secondary | ICD-10-CM | POA: Diagnosis not present

## 2023-02-24 DIAGNOSIS — M47812 Spondylosis without myelopathy or radiculopathy, cervical region: Secondary | ICD-10-CM | POA: Diagnosis not present

## 2023-02-24 DIAGNOSIS — L4052 Psoriatic arthritis mutilans: Secondary | ICD-10-CM | POA: Diagnosis not present

## 2023-02-24 DIAGNOSIS — M150 Primary generalized (osteo)arthritis: Secondary | ICD-10-CM | POA: Diagnosis not present

## 2023-02-24 DIAGNOSIS — Z79891 Long term (current) use of opiate analgesic: Secondary | ICD-10-CM | POA: Diagnosis not present

## 2023-02-24 DIAGNOSIS — G894 Chronic pain syndrome: Secondary | ICD-10-CM | POA: Diagnosis not present

## 2023-02-25 ENCOUNTER — Ambulatory Visit: Payer: Medicare Other

## 2023-02-25 DIAGNOSIS — H8113 Benign paroxysmal vertigo, bilateral: Secondary | ICD-10-CM | POA: Diagnosis not present

## 2023-02-25 DIAGNOSIS — R2681 Unsteadiness on feet: Secondary | ICD-10-CM

## 2023-02-25 DIAGNOSIS — R42 Dizziness and giddiness: Secondary | ICD-10-CM | POA: Diagnosis not present

## 2023-02-25 DIAGNOSIS — M542 Cervicalgia: Secondary | ICD-10-CM

## 2023-02-25 DIAGNOSIS — G894 Chronic pain syndrome: Secondary | ICD-10-CM | POA: Diagnosis not present

## 2023-02-25 DIAGNOSIS — Z79891 Long term (current) use of opiate analgesic: Secondary | ICD-10-CM | POA: Diagnosis not present

## 2023-02-25 NOTE — Therapy (Signed)
 OUTPATIENT PHYSICAL THERAPY VESTIBULAR TREATMENT     Patient Name: Tonya Bartlett MRN: [id number] DOB:1945/11/29, 78 y.o., female Today's Date: 02/25/2023  END OF SESSION:  PT End of Session - 02/25/23 1019     Visit Number 2    Number of Visits 8    Date for PT Re-Evaluation 03/22/23    Authorization Type Medicare A/B    Progress Note Due on Visit 10    PT Start Time 1018    PT Stop Time 1100    PT Time Calculation (min) 42 min             Past Medical History:  Diagnosis Date   Alcohol abuse    Sober 41 years.   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  1996   History of hiatal hernia    History of iron deficiency anemia 1996   iron infusion   History of migraine headaches    History of radiation therapy    left breast 03/27/2021-04/23/2021 Dr Lynwood Nasuti   History of rib fracture    Hyperlipidemia    Hypertension    IBS (irritable bowel syndrome)    Insomnia    Joint pain    MCI (mild cognitive impairment)    Melanoma (HCC) 2019   left leg    Migraine headache    hx of migraines    OA (osteoarthritis)    RIGHT SHOULDER   Osteopenia    Personal history of radiation therapy    Pneumonia    remote history   PONV (postoperative nausea and vomiting)    Recovering alcoholic (HCC)    SINCE 01-21-1980   Right rotator cuff tear    SOB (shortness of breath) on exertion    Substance abuse (HCC)    recovering alcoholic   Swallowing  difficulty    Unspecified essential hypertension    Past Surgical History:  Procedure Laterality Date   ATRIAL FIBRILLATION ABLATION N/A 10/13/2022   Procedure: ATRIAL FIBRILLATION ABLATION;  Surgeon: Cindie Ole DASEN, MD;  Location: [facility];  Service: Cardiovascular;  Laterality: N/A;   BREAST BIOPSY Left 01/06/2021   pos   BREAST LUMPECTOMY     BREAST LUMPECTOMY WITH RADIOACTIVE SEED LOCALIZATION Left 02/27/2021   Procedure: LEFT BREAST LUMPECTOMY WITH RADIOACTIVE SEED LOCALIZATION;  Surgeon: Aron Shoulders, MD;  Location: [facility];  Service: General;  Laterality: Left;   BUNIONECTOMY/  HAMMERTOE CORRECTION  RIGHT FOOT  2011   CATARACT EXTRACTION W/ INTRAOCULAR LENS  IMPLANT, BILATERAL     COLONOSCOPY     DILATION AND CURETTAGE OF UTERUS  1976   EYE SURGERY Bilateral 2016   cataract removal   KNEE ARTHROSCOPY W/ MENISCECTOMY Bilateral X2  LEFT /    X1  RIGHT  KNEE OPEN LATERAL RELEASE Bilateral    MOHS SURGERY Left 12/15/2017   Melanoma in situ - left calf - [facility]   ORIF ANKLE FRACTURE Right 08/04/2020   Procedure: OPEN REDUCTION INTERNAL FIXATION (ORIF) ANKLE FRACTURE;  Surgeon: Kit Rush, MD;  Location: [facility];  Service: Orthopedics;  Laterality: Right;  Mini C-arm, [employer] Small Frag   REPLACEMENT TOTAL KNEE Left 2006   SHOULDER ARTHROSCOPY WITH SUBACROMIAL DECOMPRESSION, ROTATOR CUFF REPAIR AND BICEP TENDON REPAIR Right 05/23/2013   Procedure: RIGHT SHOULDER ARTHROSCOPY EXAM UNDER ANESTHESIA  WITH SUBACROMIAL DECOMPRESSION,DISTAL CLAVICLE RESECTION, SADLABRAL DEBRIDEMENT CHONDROPLASTY, BICEP TENOTOMY ;  Surgeon: Lamar Collet, MD;  Location: [facility];  Service: Orthopedics;  Laterality: Right;   TONSILLECTOMY AND ADENOIDECTOMY  AGE 20   TOTAL HIP ARTHROPLASTY Left 05/04/2017   Procedure: LEFT TOTAL HIP ARTHROPLASTY ANTERIOR APPROACH;  Surgeon: Ernie Cough, MD;  Location: [facility];  Service: Orthopedics;  Laterality: Left;   TOTAL  HIP ARTHROPLASTY Right 08/01/2019   Procedure: TOTAL HIP ARTHROPLASTY ANTERIOR APPROACH;  Surgeon: Ernie Cough, MD;  Location: [facility];  Service: Orthopedics;  Laterality: Right;    TOTAL KNEE ARTHROPLASTY Bilateral LEFT  1996/   RIGHT 2004   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      UPPER GASTROINTESTINAL ENDOSCOPY     UPPER GI ENDOSCOPY     VAGINAL HYSTERECTOMY  1976   Patient Active Problem List   Diagnosis Date Noted   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   Hypercoagulable state due to paroxysmal atrial fibrillation (HCC) 12/10/2022   BMI 33.0-33.9,adult 07/22/2022   BMI 32.0-32.9,adult 04/09/2022   Obesity, Beginning BMI 36.94 04/09/2022   B12 deficiency 01/29/2022   Pain of toe of right foot 12/25/2021   Pre-diabetes 11/05/2021   Essential hypertension 11/05/2021   Left knee pain 11/05/2021   Coronary artery calcification 08/07/2021   Vitamin D  deficiency 07/29/2021   Insulin  resistance 07/17/2021   Dysphagia 03/21/2021   Gastroesophageal reflux disease 03/21/2021   History of colon polyps 03/21/2021   Genetic testing 01/31/2021   Aortic atherosclerosis (HCC) 01/17/2021   Family history of melanoma 01/16/2021   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   PAF (paroxysmal atrial fibrillation) (HCC) 10/22/2020   Family history of heart disease 10/22/2020   Trimalleolar fracture 07/31/2020   Right hip OA 08/01/2019   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   Insomnia 02/03/2019   Onychomycosis 11/28/2018   Obese 05/05/2017   S/P left THA, 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   MCI (mild cognitive impairment) 09/19/2015   Memory deficit 09/19/2015   SIRS (systemic inflammatory response syndrome) (HCC) 10/29/2014   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   Depression 11/16/2013   ADD (attention deficit disorder) 11/17/2011   Essential hypertension, benign 10/15/2009   SEBORRHEIC KERATOSIS, INFLAMED 10/17/2008   CARPAL TUNNEL SYNDROME, LEFT 06/22/2008    Allergic rhinitis 08/30/2007   Hyperlipidemia 11/11/2006   Migraine 11/11/2006   Osteoarthritis 11/11/2006    PCP: Merna Huxley, NP REFERRING PROVIDER: Merna Huxley, NP  REFERRING DIAG: H81.13 (ICD-10-CM) - Benign paroxysmal positional vertigo due to bilateral vestibular disorder  THERAPY DIAG:  Dizziness and giddiness  Unsteadiness on feet  Cervicalgia  ONSET DATE: 01/08/23  Rationale for Evaluation and Treatment: Rehabilitation  SUBJECTIVE:   SUBJECTIVE STATEMENT: Still having positional dizziness not as notable as before. Neck pain is about 4-5/10 Pt accompanied by: self  PERTINENT HISTORY: 78 year old female who  has a past medical history of Alcohol  abuse, Allergic rhinitis, Allergy, Anemia, Anxiety, Blood transfusion without reported diagnosis, Breast cancer (HCC) (12/2020), Cancer (HCC), Cataract, Clotting disorder (HCC), Colitis, Complication of anesthesia, Depression, Diverticulosis, Dysrhythmia, Fatigue, Frequency of urination, GERD (gastroesophageal reflux disease), History of colon polyps, History of DVT of lower extremity, History of hiatal hernia, History of iron deficiency anemia (1996), History of migraine headaches, History of radiation therapy, History of rib fracture, Hyperlipidemia, Hypertension, IBS (irritable bowel syndrome), Insomnia, Joint pain, MCI (mild cognitive impairment), Melanoma (HCC) (2019), Migraine headache, OA (osteoarthritis), Osteopenia, Pneumonia, PONV (postoperative nausea and vomiting), Recovering alcoholic (HCC), Right rotator cuff tear, SOB (shortness of breath) on exertion, Substance abuse (HCC), Swallowing difficulty, and Unspecified essential hypertension.  She was seen on 01/08/2023 in the ER for a fall at home where she fell down and hit her head on a brick wall. She is on Eliquis  and thankfully her CT head was negative for acute bleeding. She reports that since that time she has fallen at home in her bedroom two more times. The last  time being a week ago when she turned around too quickly and became dizzy falling to the ground. The time before that she was bending over to pet her dog and became dizzy causing her to fall to the ground. She denies LOC or head injury. She reports that constantly when she moves to quickly to change positions, bends over or lays down she becomes dizzy. She has not had any syncopal epsidoes.   PAIN:  Are you having pain? Yes: NPRS scale: 4/10 Pain location: neck L > R Pain description: aches, sore, limited with movement Aggravating factors: extension, rotation, lateral flexion Relieving factors: rest, heat  PRECAUTIONS: Fall  RED FLAGS: None   WEIGHT BEARING RESTRICTIONS: No  FALLS: Has patient fallen in last 6 months? Yes. Number of falls 3-4  LIVING ENVIRONMENT: Lives with: lives alone Lives in: House/apartment Stairs: Yes: External: 4 steps; bilateral but cannot reach both Has following equipment at home: Single point cane  PLOF: Independent  PATIENT GOALS: eliminate symptoms  OBJECTIVE:   TODAY'S TREATMENT: 02/25/23 Activity Comments  Right roll test No nystagmus, no symptoms  Left roll test No nystagmus, no symptoms  Right Dix-Hallpike Small amplitude right upbeat?  Left Dix-Hallpike Left upbeating nystagmus x 30+ sec  Left Epley   Left Dix-Hallpike Left upbeating x 18 sec  Left Epley   Left Dix-Hallpike Left upbeating x 13 sec small amplitude  Left Epley     Note: Objective measures were completed at Evaluation unless otherwise noted.  DIAGNOSTIC FINDINGS: had imaging at hospital to rule out intracranial issues, negative results  COGNITION: Overall cognitive status: Within functional limits for tasks assessed   SENSATION: Not tested  EDEMA:  None noted    POSTURE:  No Significant postural limitations  Cervical ROM:    Active A/PROM (deg) eval  Flexion 45  Extension 45 w/ left side neck pain  Right lateral flexion 20 w/ referred symptoms right scap   Left lateral flexion 30  Right rotation 35  Left rotation 35  (Blank rows = not tested)    BED MOBILITY:  indep  TRANSFERS: Assistive device utilized: None  Sit to stand: Complete Independence and Modified independence Stand to sit: Complete Independence Chair to chair: Complete Independence Floor:  NT    CURB: NT  GAIT: Gait pattern:  unsteady, lateral LOB Distance walked:  Assistive device utilized: None Level of assistance: Complete Independence Comments:     PATIENT SURVEYS:  FOTO 39 (predicted 56)  VESTIBULAR ASSESSMENT:  GENERAL OBSERVATION: recent hx of left scalp laceration from fall/striking head   SYMPTOM BEHAVIOR:  Subjective history: fell striking head 11/22 and experiencing positional dizziness since tha ttime  Non-Vestibular symptoms: neck pain  Type of dizziness: Spinning/Vertigo  Frequency: daily/positional changes  Duration: seconds-minutes  Aggravating factors: Induced by position change: rolling to the right, rolling to the left, supine to sit, and sit to stand and Induced by motion: looking up at the ceiling, bending down to the ground, turning body quickly, turning head quickly, and activity in general  Relieving factors: head stationary and slow movements  Progression of symptoms: unchanged  OCULOMOTOR EXAM:  Ocular Alignment: normal  Ocular ROM: No Limitations  Spontaneous Nystagmus: absent  Gaze-Induced Nystagmus: absent  Smooth Pursuits: intact  Saccades: hypometric/undershoots  Convergence/Divergence: 3 cm   No provocation of symptoms with oculomotor or VOR exam   VESTIBULAR - OCULAR REFLEX:   Slow VOR: Normal  VOR Cancellation: Normal  Head-Impulse Test: HIT Right: negative HIT Left: negative  Dynamic Visual Acuity: Not able to be assessed   POSITIONAL TESTING: Right Dix-Hallpike: upbeating, right nystagmus Left Dix-Hallpike: upbeating, left nystagmus Right Roll Test: no nystagmus Left Roll Test: geotropic  nystagmus  MOTION SENSITIVITY:  Motion Sensitivity Quotient Intensity: 0 = none, 1 = Lightheaded, 2 = Mild, 3 = Moderate, 4 = Severe, 5 = Vomiting  Intensity  1. Sitting to supine   2. Supine to L side   3. Supine to R side   4. Supine to sitting   5. L Hallpike-Dix   6. Up from L    7. R Hallpike-Dix   8. Up from R    9. Sitting, head tipped to L knee   10. Head up from L knee   11. Sitting, head tipped to R knee   12. Head up from R knee   13. Sitting head turns x5   14.Sitting head nods x5   15. In stance, 180 turn to L    16. In stance, 180 turn to R     OTHOSTATICS: not done  FUNCTIONAL GAIT: Functional gait assessment: TBD M-CTSIB: TBD                                                                                                                            TREATMENT DATE: 02/21/22   Canalith Repositioning:  Epley Right: Number of Reps: 2 Gaze Adaptation:   Habituation:   Other:   PATIENT EDUCATION: Education details: assessment details, rationale of intervention, indication for f/u Person educated: Patient Education method: Explanation Education comprehension: verbalized understanding  HOME EXERCISE PROGRAM:  GOALS: Goals reviewed with patient? Yes  SHORT TERM GOALS: Target date: same as LTG    LONG TERM GOALS: Target date: 03/22/2023    Patient will be independent in HEP to improve functional outcomes Baseline:  Goal status: INITIAL  2.  Pt to be free of positional dizziness to improve balance and reduce risk for  falls Baseline: +R/L Dixhallpike (upbeating respectively); + left Roll test w/ geotropic Goal status: INITIAL  3.  Pt to report improved symptoms and greater activity participation per meeting FOTO requirements (56) Baseline: 39 Goal status: INITIAL  4.  Demo low risk for falls per score 24/30 Functional Gait Assessment to improve safety with community mobility Baseline: TBD Goal status: INITIAL  5.  Pt to report neck pain not  exceeding 2/10 during functional tasks to improve activity tolerance   Baseline: 4/10  Goal status: INITIAL  ASSESSMENT:  CLINICAL IMPRESSION: Reports some improvement in intensity of symptoms since initial visit with canalith repositioning.  Initiated with positional tests w/ Roll Test left/right unrevealing.  Right Dix-Hallpike revealing for small amplitude right upbeating and Left Dix-Hallpike revealing left upbeating nysstagmus with more symptomatic presentation.  Performed several rounds of Left Dix-Hallpike to Left Epley maneuver for treatment of left posterior canal BPPV tolerating sessions well with seated rest periods between reps with MHP to neck due to pain/guarding and to allow time to rest to reduce risk for canal conversion.  Tolerated session well and continued sessions indicated to address positional dizziness and begin interventions for neck pain.  OBJECTIVE IMPAIRMENTS: Abnormal gait, decreased activity tolerance, decreased balance, difficulty walking, decreased ROM, dizziness, increased muscle spasms, and pain.   ACTIVITY LIMITATIONS: carrying, lifting, bending, stairs, transfers, bed mobility, reach over head, and locomotion level  PARTICIPATION LIMITATIONS: meal prep, cleaning, laundry, interpersonal relationship, shopping, yard work, and exercise routine/class  PERSONAL FACTORS: Time since onset of injury/illness/exacerbation and 1-2 comorbidities: PMH/ recent hx of head trauma  are also affecting patient's functional outcome.   REHAB POTENTIAL: Excellent  CLINICAL DECISION MAKING: Evolving/moderate complexity  EVALUATION COMPLEXITY: Moderate   PLAN:  PT FREQUENCY: 1-2x/week  PT DURATION: 4 weeks  PLANNED INTERVENTIONS: 97110-Therapeutic exercises, 97530- Therapeutic activity, [id number]- Neuromuscular re-education, 97535- Self Care, [id number]- Manual therapy, [id number]- Gait training, [phone]- Canalith repositioning, [id number]- Aquatic Therapy, [id number]- Electrical stimulation  (unattended), Patient/Family education, Balance training, Stair training, Taping, Dry Needling, Joint mobilization, Spinal mobilization, Vestibular training, DME instructions, Cryotherapy, and Moist heat  PLAN FOR NEXT SESSION: re-assess positional dizziness (multi-canal/ear involvement), neck pain   10:20 AM, 02/25/23 M. Kelly Jillana Selph, PT, DPT Physical Therapist- [place] Office Number: [phone]

## 2023-03-02 ENCOUNTER — Ambulatory Visit: Payer: Medicare Other

## 2023-03-02 DIAGNOSIS — R2681 Unsteadiness on feet: Secondary | ICD-10-CM

## 2023-03-02 DIAGNOSIS — R42 Dizziness and giddiness: Secondary | ICD-10-CM | POA: Diagnosis not present

## 2023-03-02 DIAGNOSIS — M542 Cervicalgia: Secondary | ICD-10-CM

## 2023-03-02 DIAGNOSIS — H8113 Benign paroxysmal vertigo, bilateral: Secondary | ICD-10-CM | POA: Diagnosis not present

## 2023-03-02 NOTE — Therapy (Signed)
 OUTPATIENT PHYSICAL THERAPY VESTIBULAR TREATMENT     Patient Name: Tonya Bartlett MRN: [id number] DOB:1945/02/18, 78 y.o., female Today's Date: 03/02/2023  END OF SESSION:  PT End of Session - 03/02/23 1448     Visit Number 3    Number of Visits 8    Date for PT Re-Evaluation 03/22/23    Authorization Type Medicare A/B    Progress Note Due on Visit 10    PT Start Time 1445    PT Stop Time 1530    PT Time Calculation (min) 45 min             Past Medical History:  Diagnosis Date   Alcohol abuse    Sober 41 years.   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  1996   History of hiatal hernia    History of iron deficiency anemia 1996   iron infusion   History of migraine headaches    History of radiation therapy    left breast 03/27/2021-04/23/2021 Dr Lynwood Nasuti   History of rib fracture    Hyperlipidemia    Hypertension    IBS (irritable bowel syndrome)    Insomnia    Joint pain    MCI (mild cognitive impairment)    Melanoma (HCC) 2019   left leg    Migraine headache    hx of migraines    OA (osteoarthritis)    RIGHT SHOULDER   Osteopenia    Personal history of radiation therapy    Pneumonia    remote history   PONV (postoperative nausea and vomiting)    Recovering alcoholic (HCC)    SINCE 01-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   Right rotator cuff tear    SOB (shortness of breath) on exertion    Substance abuse (HCC)    recovering alcoholic   Swallowing  difficulty    Unspecified essential hypertension    Past Surgical History:  Procedure Laterality Date   ATRIAL FIBRILLATION ABLATION N/A 10/13/2022   Procedure: ATRIAL FIBRILLATION ABLATION;  Surgeon: Cindie Ole DASEN, MD;  Location: [facility];  Service: Cardiovascular;  Laterality: N/A;   BREAST BIOPSY Left 01/06/2021   pos   BREAST LUMPECTOMY     BREAST LUMPECTOMY WITH RADIOACTIVE SEED LOCALIZATION Left 02/27/2021   Procedure: LEFT BREAST LUMPECTOMY WITH RADIOACTIVE SEED LOCALIZATION;  Surgeon: Aron Shoulders, MD;  Location: [facility];  Service: General;  Laterality: Left;   BUNIONECTOMY/  HAMMERTOE CORRECTION  RIGHT FOOT  2011   CATARACT EXTRACTION W/ INTRAOCULAR LENS  IMPLANT, BILATERAL     COLONOSCOPY     DILATION AND CURETTAGE OF UTERUS  1976   EYE SURGERY Bilateral 2016   cataract removal   KNEE ARTHROSCOPY W/ MENISCECTOMY Bilateral X2  LEFT /    X1  RIGHT  KNEE OPEN LATERAL RELEASE Bilateral    MOHS SURGERY Left 12/15/2017   Melanoma in situ - left calf - [facility]   ORIF ANKLE FRACTURE Right 08/04/2020   Procedure: OPEN REDUCTION INTERNAL FIXATION (ORIF) ANKLE FRACTURE;  Surgeon: Kit Rush, MD;  Location: [facility];  Service: Orthopedics;  Laterality: Right;  Mini C-arm, [employer] Small Frag   REPLACEMENT TOTAL KNEE Left 2006   SHOULDER ARTHROSCOPY WITH SUBACROMIAL DECOMPRESSION, ROTATOR CUFF REPAIR AND BICEP TENDON REPAIR Right 05/23/2013   Procedure: RIGHT SHOULDER ARTHROSCOPY EXAM UNDER ANESTHESIA  WITH SUBACROMIAL DECOMPRESSION,DISTAL CLAVICLE RESECTION, SADLABRAL DEBRIDEMENT CHONDROPLASTY, BICEP TENOTOMY ;  Surgeon: Lamar Collet, MD;  Location: [facility];  Service: Orthopedics;  Laterality: Right;   TONSILLECTOMY AND ADENOIDECTOMY  AGE 47   TOTAL HIP ARTHROPLASTY Left 05/04/2017   Procedure: LEFT TOTAL HIP ARTHROPLASTY ANTERIOR APPROACH;  Surgeon: Ernie Cough, MD;  Location: [facility];  Service: Orthopedics;  Laterality: Left;   TOTAL  HIP ARTHROPLASTY Right 08/01/2019   Procedure: TOTAL HIP ARTHROPLASTY ANTERIOR APPROACH;  Surgeon: Ernie Cough, MD;  Location: [facility];  Service: Orthopedics;  Laterality: Right;    TOTAL KNEE ARTHROPLASTY Bilateral LEFT  1996/   RIGHT 2004   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      UPPER GASTROINTESTINAL ENDOSCOPY     UPPER GI ENDOSCOPY     VAGINAL HYSTERECTOMY  1976   Patient Active Problem List   Diagnosis Date Noted   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   Hypercoagulable state due to paroxysmal atrial fibrillation (HCC) 12/10/2022   BMI 33.0-33.9,adult 07/22/2022   BMI 32.0-32.9,adult 04/09/2022   Obesity, Beginning BMI 36.94 04/09/2022   B12 deficiency 01/29/2022   Pain of toe of right foot 12/25/2021   Pre-diabetes 11/05/2021   Essential hypertension 11/05/2021   Left knee pain 11/05/2021   Coronary artery calcification 08/07/2021   Vitamin D  deficiency 07/29/2021   Insulin  resistance 07/17/2021   Dysphagia 03/21/2021   Gastroesophageal reflux disease 03/21/2021   History of colon polyps 03/21/2021   Genetic testing 01/31/2021   Aortic atherosclerosis (HCC) 01/17/2021   Family history of melanoma 01/16/2021   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   PAF (paroxysmal atrial fibrillation) (HCC) 10/22/2020   Family history of heart disease 10/22/2020   Trimalleolar fracture 07/31/2020   Right hip OA 08/01/2019   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   Insomnia 02/03/2019   Onychomycosis 11/28/2018   Obese 05/05/2017   S/P left THA, 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   MCI (mild cognitive impairment) 09/19/2015   Memory deficit 09/19/2015   SIRS (systemic inflammatory response syndrome) (HCC) 10/29/2014   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   Depression 11/16/2013   ADD (attention deficit disorder) 11/17/2011   Essential hypertension, benign 10/15/2009   SEBORRHEIC KERATOSIS, INFLAMED 10/17/2008   CARPAL TUNNEL SYNDROME, LEFT 06/22/2008    Allergic rhinitis 08/30/2007   Hyperlipidemia 11/11/2006   Migraine 11/11/2006   Osteoarthritis 11/11/2006    PCP: Merna Huxley, NP REFERRING PROVIDER: Merna Huxley, NP  REFERRING DIAG: H81.13 (ICD-10-CM) - Benign paroxysmal positional vertigo due to bilateral vestibular disorder  THERAPY DIAG:  Dizziness and giddiness  Unsteadiness on feet  Cervicalgia  ONSET DATE: 01/08/23  Rationale for Evaluation and Treatment: Rehabilitation  SUBJECTIVE:   SUBJECTIVE STATEMENT: The dizziness has improved significantly Pt accompanied by: self  PERTINENT HISTORY: 78 year old female who  has a past medical history of Alcohol abuse, Allergic rhinitis, Allergy, Anemia, Anxiety, Blood transfusion without  reported diagnosis, Breast cancer (HCC) (12/2020), Cancer (HCC), Cataract, Clotting disorder (HCC), Colitis, Complication of anesthesia, Depression, Diverticulosis, Dysrhythmia, Fatigue, Frequency of urination, GERD (gastroesophageal reflux disease), History of colon polyps, History of DVT of lower extremity, History of hiatal hernia, History of iron deficiency anemia (1996), History of migraine headaches, History of radiation therapy, History of rib fracture, Hyperlipidemia, Hypertension, IBS (irritable bowel syndrome), Insomnia, Joint pain, MCI (mild cognitive impairment), Melanoma (HCC) (2019), Migraine headache, OA (osteoarthritis), Osteopenia, Pneumonia, PONV (postoperative nausea and vomiting), Recovering alcoholic (HCC), Right rotator cuff tear, SOB (shortness of breath) on exertion, Substance abuse (HCC), Swallowing difficulty, and Unspecified essential hypertension.  She was seen on 01/08/2023 in the ER for a fall at home where she fell down and hit her head on a brick wall. She is on Eliquis  and thankfully her CT head was negative for acute bleeding. She reports that since that time she has fallen at home in her bedroom two more times. The last time being a week ago when she turned around  too quickly and became dizzy falling to the ground. The time before that she was bending over to pet her dog and became dizzy causing her to fall to the ground. She denies LOC or head injury. She reports that constantly when she moves to quickly to change positions, bends over or lays down she becomes dizzy. She has not had any syncopal epsidoes.   PAIN:  Are you having pain? Yes: NPRS scale: 4/10 Pain location: neck L > R Pain description: aches, sore, limited with movement Aggravating factors: extension, rotation, lateral flexion Relieving factors: rest, heat  PRECAUTIONS: Fall  RED FLAGS: None   WEIGHT BEARING RESTRICTIONS: No  FALLS: Has patient fallen in last 6 months? Yes. Number of falls 3-4  LIVING ENVIRONMENT: Lives with: lives alone Lives in: House/apartment Stairs: Yes: External: 4 steps; bilateral but cannot reach both Has following equipment at home: Single point cane  PLOF: Independent  PATIENT GOALS: eliminate symptoms  OBJECTIVE:   TODAY'S TREATMENT: 03/02/23 Activity Comments  Roll test Left/right: no nystagmus no dizziness  Dix-Hallpike Right: small amplitude right upbeating Left: large amplitude left upbeating  L Epley   Left Dix-Hallpike No nystagmus no symptoms  Right Dix-Hallpike Small amplitude right upbeating  R Epley   Left Dix-hallpike No nystagmus no symptoms     TODAY'S TREATMENT: 02/25/23 Activity Comments  Right roll test No nystagmus, no symptoms  Left roll test No nystagmus, no symptoms  Right Dix-Hallpike Small amplitude right upbeat?  Left Dix-Hallpike Left upbeating nystagmus x 30+ sec  Left Epley   Left Dix-Hallpike Left upbeating x 18 sec  Left Epley   Left Dix-Hallpike Left upbeating x 13 sec small amplitude  Left Epley     Note: Objective measures were completed at Evaluation unless otherwise noted.  DIAGNOSTIC FINDINGS: had imaging at hospital to rule out intracranial issues, negative results  COGNITION: Overall cognitive  status: Within functional limits for tasks assessed   SENSATION: Not tested  EDEMA:  None noted    POSTURE:  No Significant postural limitations  Cervical ROM:    Active A/PROM (deg) eval  Flexion 45  Extension 45 w/ left side neck pain  Right lateral flexion 20 w/ referred symptoms right scap  Left lateral flexion 30  Right rotation 35  Left rotation 35  (Blank rows = not tested)    BED MOBILITY:  indep  TRANSFERS: Assistive device utilized: None  Sit to stand: Complete Independence and Modified independence Stand to  sit: Complete Independence Chair to chair: Complete Independence Floor:  NT    CURB: NT  GAIT: Gait pattern:  unsteady, lateral LOB Distance walked:  Assistive device utilized: None Level of assistance: Complete Independence Comments:     PATIENT SURVEYS:  FOTO 39 (predicted 56)  VESTIBULAR ASSESSMENT:  GENERAL OBSERVATION: recent hx of left scalp laceration from fall/striking head   SYMPTOM BEHAVIOR:  Subjective history: fell striking head 11/22 and experiencing positional dizziness since tha ttime  Non-Vestibular symptoms: neck pain  Type of dizziness: Spinning/Vertigo  Frequency: daily/positional changes  Duration: seconds-minutes  Aggravating factors: Induced by position change: rolling to the right, rolling to the left, supine to sit, and sit to stand and Induced by motion: looking up at the ceiling, bending down to the ground, turning body quickly, turning head quickly, and activity in general  Relieving factors: head stationary and slow movements  Progression of symptoms: unchanged  OCULOMOTOR EXAM:  Ocular Alignment: normal  Ocular ROM: No Limitations  Spontaneous Nystagmus: absent  Gaze-Induced Nystagmus: absent  Smooth Pursuits: intact  Saccades: hypometric/undershoots  Convergence/Divergence: 3 cm   No provocation of symptoms with oculomotor or VOR exam   VESTIBULAR - OCULAR REFLEX:   Slow VOR: Normal  VOR  Cancellation: Normal  Head-Impulse Test: HIT Right: negative HIT Left: negative  Dynamic Visual Acuity: Not able to be assessed   POSITIONAL TESTING: Right Dix-Hallpike: upbeating, right nystagmus Left Dix-Hallpike: upbeating, left nystagmus Right Roll Test: no nystagmus Left Roll Test: geotropic nystagmus  MOTION SENSITIVITY:  Motion Sensitivity Quotient Intensity: 0 = none, 1 = Lightheaded, 2 = Mild, 3 = Moderate, 4 = Severe, 5 = Vomiting  Intensity  1. Sitting to supine   2. Supine to L side   3. Supine to R side   4. Supine to sitting   5. L Hallpike-Dix   6. Up from L    7. R Hallpike-Dix   8. Up from R    9. Sitting, head tipped to L knee   10. Head up from L knee   11. Sitting, head tipped to R knee   12. Head up from R knee   13. Sitting head turns x5   14.Sitting head nods x5   15. In stance, 180 turn to L    16. In stance, 180 turn to R     OTHOSTATICS: not done  FUNCTIONAL GAIT: Functional gait assessment: TBD M-CTSIB: TBD                                                                                                                            TREATMENT DATE: 02/21/22   Canalith Repositioning:  Epley Right: Number of Reps: 2 Gaze Adaptation:   Habituation:   Other:   PATIENT EDUCATION: Education details: assessment details, rationale of intervention, indication for f/u Person educated: Patient Education method: Explanation Education comprehension: verbalized understanding  HOME EXERCISE PROGRAM:  GOALS: Goals reviewed with patient? Yes  SHORT TERM GOALS:  Target date: same as LTG    LONG TERM GOALS: Target date: 03/22/2023    Patient will be independent in HEP to improve functional outcomes Baseline:  Goal status: INITIAL  2.  Pt to be free of positional dizziness to improve balance and reduce risk for falls Baseline: +R/L Dixhallpike (upbeating respectively); + left Roll test w/ geotropic Goal status: INITIAL  3.  Pt to report  improved symptoms and greater activity participation per meeting FOTO requirements (56) Baseline: 39 Goal status: INITIAL  4.  Demo low risk for falls per score 24/30 Functional Gait Assessment to improve safety with community mobility Baseline: TBD Goal status: INITIAL  5.  Pt to report neck pain not exceeding 2/10 during functional tasks to improve activity tolerance   Baseline: 4/10  Goal status: INITIAL  ASSESSMENT:  CLINICAL IMPRESSION: Reports marked improvement in positional dizziness.  Positional testing reveals no dizziness/nystagmus to Roll Test left/right and demonstrates ongoing upbeating nystagmus left/right with Dix-Hallpike.  Proceeded with left Epley maneuver and no symptoms/nystagmus to repeat testing.  Proceeded with treatment of right with Epley maneuver but unable to re-test due to time constraint.  Continued sessions to address positional dizziness and initiate neck pain treatment  OBJECTIVE IMPAIRMENTS: Abnormal gait, decreased activity tolerance, decreased balance, difficulty walking, decreased ROM, dizziness, increased muscle spasms, and pain.   ACTIVITY LIMITATIONS: carrying, lifting, bending, stairs, transfers, bed mobility, reach over head, and locomotion level  PARTICIPATION LIMITATIONS: meal prep, cleaning, laundry, interpersonal relationship, shopping, yard work, and exercise routine/class  PERSONAL FACTORS: Time since onset of injury/illness/exacerbation and 1-2 comorbidities: PMH/ recent hx of head trauma  are also affecting patient's functional outcome.   REHAB POTENTIAL: Excellent  CLINICAL DECISION MAKING: Evolving/moderate complexity  EVALUATION COMPLEXITY: Moderate   PLAN:  PT FREQUENCY: 1-2x/week  PT DURATION: 4 weeks  PLANNED INTERVENTIONS: 97110-Therapeutic exercises, 97530- Therapeutic activity, [id number]- Neuromuscular re-education, 97535- Self Care, [id number]- Manual therapy, [phone]- Gait training, [id number]- Canalith repositioning, [id number]- Aquatic  Therapy, [id number]- Electrical stimulation (unattended), Patient/Family education, Balance training, Stair training, Taping, Dry Needling, Joint mobilization, Spinal mobilization, Vestibular training, DME instructions, Cryotherapy, and Moist heat  PLAN FOR NEXT SESSION: re-assess positional dizziness (Left/right BPPV), initiate treatment for neck pain   2:49 PM, 03/02/23 M. Kelly Tyliah Schlereth, PT, DPT Physical Therapist- [place] Office Number: [phone]

## 2023-03-04 ENCOUNTER — Ambulatory Visit (HOSPITAL_COMMUNITY): Payer: Medicare Other | Attending: Physician Assistant

## 2023-03-04 DIAGNOSIS — I480 Paroxysmal atrial fibrillation: Secondary | ICD-10-CM | POA: Insufficient documentation

## 2023-03-04 DIAGNOSIS — R0602 Shortness of breath: Secondary | ICD-10-CM | POA: Insufficient documentation

## 2023-03-04 LAB — ECHOCARDIOGRAM COMPLETE
AR max vel: 2.64 cm2
AV Area VTI: 2.59 cm2
AV Area mean vel: 2.64 cm2
AV Mean grad: 5.5 mm[Hg]
AV Peak grad: 9.5 mm[Hg]
Ao pk vel: 1.55 m/s
Area-P 1/2: 3.59 cm2
S' Lateral: 3.2 cm

## 2023-03-05 ENCOUNTER — Other Ambulatory Visit (HOSPITAL_COMMUNITY): Payer: Self-pay | Admitting: Psychiatry

## 2023-03-05 ENCOUNTER — Ambulatory Visit: Payer: Medicare Other | Admitting: Physical Therapy

## 2023-03-05 NOTE — Therapy (Signed)
OUTPATIENT PHYSICAL THERAPY VESTIBULAR TREATMENT     Patient Name: Tonya Bartlett MRN: [id number] DOB:Apr 10, 1945, 78 y.o., female Today's Date: 03/09/2023  END OF SESSION:  PT End of Session - 03/09/23 1100     Visit Number 4    Number of Visits 8    Date for PT Re-Evaluation 03/22/23    Authorization Type Medicare A/B    Progress Note Due on Visit 10    PT Start Time 1024   pt late   PT Stop Time 1059    PT Time Calculation (min) 35 min    Equipment Utilized During Treatment Gait belt    Activity Tolerance Patient tolerated treatment well    Behavior During Therapy WFL for tasks assessed/performed              Past Medical History:  Diagnosis Date   Alcohol abuse    Sober 41 years.   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  1996   History of hiatal hernia    History of iron deficiency anemia 1996   iron infusion   History of migraine headaches    History of radiation therapy    left breast 03/27/2021-04/23/2021 Dr Antony Blackbird   History of rib fracture    Hyperlipidemia    Hypertension    IBS (irritable bowel syndrome)    Insomnia    Joint pain    MCI (mild cognitive impairment)    Melanoma (HCC) 2019   left leg    Migraine headache    hx of migraines    OA (osteoarthritis)    RIGHT SHOULDER   Osteopenia    Personal history of radiation therapy    Pneumonia    remote history   PONV (postoperative nausea and vomiting)     Recovering alcoholic (HCC)    SINCE 01-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   Right rotator cuff tear    SOB (shortness of breath) on exertion    Substance abuse (HCC)    recovering alcoholic   Swallowing difficulty    Unspecified essential hypertension    Past Surgical History:  Procedure Laterality Date   ATRIAL FIBRILLATION ABLATION N/A 10/13/2022   Procedure: ATRIAL FIBRILLATION ABLATION;  Surgeon: Lanier Prude, MD;  Location: [facility];  Service: Cardiovascular;  Laterality: N/A;   BREAST BIOPSY Left 01/06/2021   pos   BREAST LUMPECTOMY     BREAST LUMPECTOMY WITH RADIOACTIVE SEED LOCALIZATION Left 02/27/2021   Procedure: LEFT BREAST LUMPECTOMY WITH RADIOACTIVE SEED LOCALIZATION;  Surgeon: Almond Lint, MD;  Location: [facility];  Service: General;  Laterality: Left;   BUNIONECTOMY/  HAMMERTOE CORRECTION  RIGHT FOOT  2011   CATARACT EXTRACTION W/ INTRAOCULAR LENS  IMPLANT, BILATERAL     COLONOSCOPY     DILATION AND  CURETTAGE OF UTERUS  1976   EYE SURGERY Bilateral 2016   cataract removal   KNEE ARTHROSCOPY W/ MENISCECTOMY Bilateral X2  LEFT /    X1  RIGHT   KNEE OPEN LATERAL RELEASE Bilateral    MOHS SURGERY Left 12/15/2017   Melanoma in situ - left calf - [facility]   ORIF ANKLE FRACTURE Right 08/04/2020   Procedure: OPEN REDUCTION INTERNAL FIXATION (ORIF) ANKLE FRACTURE;  Surgeon: Toni Arthurs, MD;  Location: [facility];  Service: Orthopedics;  Laterality: Right;  Mini C-arm, [employer] Small Frag   REPLACEMENT TOTAL KNEE Left 2006   SHOULDER ARTHROSCOPY WITH SUBACROMIAL DECOMPRESSION, ROTATOR CUFF REPAIR AND BICEP TENDON REPAIR Right 05/23/2013   Procedure: RIGHT SHOULDER ARTHROSCOPY EXAM UNDER ANESTHESIA  WITH SUBACROMIAL DECOMPRESSION,DISTAL CLAVICLE RESECTION, SADLABRAL DEBRIDEMENT CHONDROPLASTY, BICEP TENOTOMY ;  Surgeon: Eugenia Mcalpine, MD;  Location: [facility];  Service: Orthopedics;  Laterality: Right;   TONSILLECTOMY AND ADENOIDECTOMY  AGE 39   TOTAL HIP  ARTHROPLASTY Left 05/04/2017   Procedure: LEFT TOTAL HIP ARTHROPLASTY ANTERIOR APPROACH;  Surgeon: Durene Romans, MD;  Location: [facility];  Service: Orthopedics;  Laterality: Left;   TOTAL HIP ARTHROPLASTY Right 08/01/2019   Procedure: TOTAL HIP ARTHROPLASTY ANTERIOR APPROACH;  Surgeon: Durene Romans, MD;  Location: [facility];  Service: Orthopedics;  Laterality: Right;    TOTAL KNEE ARTHROPLASTY Bilateral LEFT  1996/   RIGHT 2004   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      UPPER GASTROINTESTINAL ENDOSCOPY     UPPER GI ENDOSCOPY     VAGINAL HYSTERECTOMY  1976   Patient Active Problem List   Diagnosis Date Noted   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   Hypercoagulable state due to paroxysmal atrial fibrillation (HCC) 12/10/2022   BMI 33.0-33.9,adult 07/22/2022   BMI 32.0-32.9,adult 04/09/2022   Obesity, Beginning BMI 36.94 04/09/2022   B12 deficiency 01/29/2022   Pain of toe of right foot 12/25/2021   Pre-diabetes 11/05/2021   Essential hypertension 11/05/2021   Left knee pain 11/05/2021   Coronary artery calcification 08/07/2021   Vitamin D deficiency 07/29/2021   Insulin resistance 07/17/2021   Dysphagia 03/21/2021   Gastroesophageal reflux disease 03/21/2021   History of colon polyps 03/21/2021   Genetic testing 01/31/2021   Aortic atherosclerosis (HCC) 01/17/2021   Family history of melanoma 01/16/2021   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   PAF (paroxysmal atrial fibrillation) (HCC) 10/22/2020   Family history of heart disease 10/22/2020   Trimalleolar fracture 07/31/2020   Right hip OA 08/01/2019   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   Insomnia 02/03/2019   Onychomycosis 11/28/2018   Obese 05/05/2017   S/P left THA, 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   MCI (mild cognitive impairment) 09/19/2015   Memory deficit 09/19/2015   SIRS (systemic inflammatory response syndrome) (HCC) 10/29/2014   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   Depression  11/16/2013   ADD (attention deficit disorder) 11/17/2011   Essential hypertension, benign 10/15/2009   SEBORRHEIC KERATOSIS, INFLAMED 10/17/2008   CARPAL TUNNEL SYNDROME, LEFT 06/22/2008   Allergic rhinitis 08/30/2007   Hyperlipidemia 11/11/2006   Migraine 11/11/2006   Osteoarthritis 11/11/2006    PCP: Shirline Frees, NP REFERRING PROVIDER: Shirline Frees, NP  REFERRING DIAG: H81.13 (ICD-10-CM) - Benign paroxysmal positional vertigo due to bilateral vestibular disorder  THERAPY DIAG:  Dizziness and giddiness  Unsteadiness on feet  Cervicalgia  ONSET DATE: 01/08/23  Rationale for Evaluation and Treatment: Rehabilitation  SUBJECTIVE:   SUBJECTIVE STATEMENT: "  I've been feeling okay, in fact it's almost all gone." Reports 80-90% improved.   Pt accompanied by: self  PERTINENT HISTORY: 78 year old female who  has a past medical history of Alcohol abuse, Allergic rhinitis, Allergy, Anemia, Anxiety, Blood transfusion without reported diagnosis, Breast cancer (HCC) (12/2020), Cancer (HCC), Cataract, Clotting disorder (HCC), Colitis, Complication of anesthesia, Depression, Diverticulosis, Dysrhythmia, Fatigue, Frequency of urination, GERD (gastroesophageal reflux disease), History of colon polyps, History of DVT of lower extremity, History of hiatal hernia, History of iron deficiency anemia (1996), History of migraine headaches, History of radiation therapy, History of rib fracture, Hyperlipidemia, Hypertension, IBS (irritable bowel syndrome), Insomnia, Joint pain, MCI (mild cognitive impairment), Melanoma (HCC) (2019), Migraine headache, OA (osteoarthritis), Osteopenia, Pneumonia, PONV (postoperative nausea and vomiting), Recovering alcoholic (HCC), Right rotator cuff tear, SOB (shortness of breath) on exertion, Substance abuse (HCC), Swallowing difficulty, and Unspecified essential hypertension.  She was seen on 01/08/2023 in the ER for a fall at home where she fell down and hit her head  on a brick wall. She is on Eliquis and thankfully her CT head was negative for acute bleeding. She reports that since that time she has fallen at home in her bedroom two more times. The last time being a week ago when she turned around too quickly and became dizzy falling to the ground. The time before that she was bending over to pet her dog and became dizzy causing her to fall to the ground. She denies LOC or head injury. She reports that constantly when she moves to quickly to change positions, bends over or lays down she becomes dizzy. She has not had any syncopal epsidoes.   PAIN:  Are you having pain? Yes: NPRS scale: 0/10 Pain location: neck L > R Pain description: aches, sore, limited with movement Aggravating factors: extension, rotation, lateral flexion Relieving factors: rest, heat  PRECAUTIONS: Fall  RED FLAGS: None   WEIGHT BEARING RESTRICTIONS: No  FALLS: Has patient fallen in last 6 months? Yes. Number of falls 3-4  LIVING ENVIRONMENT: Lives with: lives alone Lives in: House/apartment Stairs: Yes: External: 4 steps; bilateral but cannot reach both Has following equipment at home: Single point cane  PLOF: Independent  PATIENT GOALS: eliminate symptoms  OBJECTIVE:     TODAY'S TREATMENT: 03/09/23 Activity Comments  L DH negative  R DH R upbeating torsional vs. L downbeating torsional lasting 40 sec  R Epley  Tolerated well  R DH Smaller amplitude R upbeating torsional vs. L downbeating torsional lasting 15 sec  FGA 19/30  Romberg EO/EC head turns/nods In corner; cueing to widen feet to increase stability         [facility] PT Assessment - 03/09/23 0001       Functional Gait  Assessment   Gait assessed  Yes    Gait Level Surface Walks 20 ft in less than 7 sec but greater than 5.5 sec, uses assistive device, slower speed, mild gait deviations, or deviates 6-10 in outside of the 12 in walkway width.    Change in Gait Speed Able to change speed, demonstrates mild  gait deviations, deviates 6-10 in outside of the 12 in walkway width, or no gait deviations, unable to achieve a major change in velocity, or uses a change in velocity, or uses an assistive device.    Gait with Horizontal Head Turns Performs head turns smoothly with slight change in gait velocity (eg, minor disruption to smooth gait path), deviates 6-10 in outside 12 in walkway width, or  uses an assistive device.    Gait with Vertical Head Turns Performs task with slight change in gait velocity (eg, minor disruption to smooth gait path), deviates 6 - 10 in outside 12 in walkway width or uses assistive device    Gait and Pivot Turn Pivot turns safely in greater than 3 sec and stops with no loss of balance, or pivot turns safely within 3 sec and stops with mild imbalance, requires small steps to catch balance.    Step Over Obstacle Is able to step over 2 stacked shoe boxes taped together (9 in total height) without changing gait speed. No evidence of imbalance.    Gait with Narrow Base of Support Ambulates less than 4 steps heel to toe or cannot perform without assistance.    Gait with Eyes Closed Walks 20 ft, uses assistive device, slower speed, mild gait deviations, deviates 6-10 in outside 12 in walkway width. Ambulates 20 ft in less than 9 sec but greater than 7 sec.    Ambulating Backwards Walks 20 ft, uses assistive device, slower speed, mild gait deviations, deviates 6-10 in outside 12 in walkway width.    Steps Alternating feet, must use rail.    Total Score 19              HOME EXERCISE PROGRAM Last updated: 03/09/23 Access Code: [email or web address] URL: [email or web address] Date: 03/09/2023 Prepared by: [facility] - Outpatient  Rehab - [facility]  Exercises - Brandt-Daroff Vestibular Exercise  - 1 x daily - 5 x weekly - 2 sets - 10 reps - Standing Hip Abduction with Counter Support  - 1 x daily - 5 x weekly - 2 sets - 10 reps - Tandem Walking with Counter Support  - 1 x  daily - 5 x weekly - 2 sets - 10 reps - Narrow Stance with Eyes Closed and Head Nods  - 1 x daily - 5 x weekly - 2 sets - 30 sec hold - Narrow Stance with Eyes Closed and Head Rotation  - 1 x daily - 5 x weekly - 2 sets - 30 sec hold   PATIENT EDUCATION: Education details: edu on exam findings and remaining impairments, HEP with edu for safety Person educated: Patient Education method: Explanation, Demonstration, Tactile cues, Verbal cues, and Handouts Education comprehension: verbalized understanding and returned demonstration     Note: Objective measures were completed at Evaluation unless otherwise noted.  DIAGNOSTIC FINDINGS: had imaging at hospital to rule out intracranial issues, negative results  COGNITION: Overall cognitive status: Within functional limits for tasks assessed   SENSATION: Not tested  EDEMA:  None noted    POSTURE:  No Significant postural limitations  Cervical ROM:    Active A/PROM (deg) eval  Flexion 45  Extension 45 w/ left side neck pain  Right lateral flexion 20 w/ referred symptoms right scap  Left lateral flexion 30  Right rotation 35  Left rotation 35  (Blank rows = not tested)    BED MOBILITY:  indep  TRANSFERS: Assistive device utilized: None  Sit to stand: Complete Independence and Modified independence Stand to sit: Complete Independence Chair to chair: Complete Independence Floor:  NT    CURB: NT  GAIT: Gait pattern:  unsteady, lateral LOB Distance walked:  Assistive device utilized: None Level of assistance: Complete Independence Comments:     PATIENT SURVEYS:  FOTO 39 (predicted 56)  VESTIBULAR ASSESSMENT:  GENERAL OBSERVATION: recent hx of left scalp laceration from fall/striking head   SYMPTOM BEHAVIOR:  Subjective history: fell striking head 11/22 and experiencing positional dizziness since tha ttime  Non-Vestibular symptoms: neck pain  Type of dizziness: Spinning/Vertigo  Frequency:  daily/positional changes  Duration: seconds-minutes  Aggravating factors: Induced by position change: rolling to the right, rolling to the left, supine to sit, and sit to stand and Induced by motion: looking up at the ceiling, bending down to the ground, turning body quickly, turning head quickly, and activity in general  Relieving factors: head stationary and slow movements  Progression of symptoms: unchanged  OCULOMOTOR EXAM:  Ocular Alignment: normal  Ocular ROM: No Limitations  Spontaneous Nystagmus: absent  Gaze-Induced Nystagmus: absent  Smooth Pursuits: intact  Saccades: hypometric/undershoots  Convergence/Divergence: 3 cm   No provocation of symptoms with oculomotor or VOR exam   VESTIBULAR - OCULAR REFLEX:   Slow VOR: Normal  VOR Cancellation: Normal  Head-Impulse Test: HIT Right: negative HIT Left: negative  Dynamic Visual Acuity: Not able to be assessed   POSITIONAL TESTING: Right Dix-Hallpike: upbeating, right nystagmus Left Dix-Hallpike: upbeating, left nystagmus Right Roll Test: no nystagmus Left Roll Test: geotropic nystagmus  MOTION SENSITIVITY:  Motion Sensitivity Quotient Intensity: 0 = none, 1 = Lightheaded, 2 = Mild, 3 = Moderate, 4 = Severe, 5 = Vomiting  Intensity  1. Sitting to supine   2. Supine to L side   3. Supine to R side   4. Supine to sitting   5. L Hallpike-Dix   6. Up from L    7. R Hallpike-Dix   8. Up from R    9. Sitting, head tipped to L knee   10. Head up from L knee   11. Sitting, head tipped to R knee   12. Head up from R knee   13. Sitting head turns x5   14.Sitting head nods x5   15. In stance, 180 turn to L    16. In stance, 180 turn to R     OTHOSTATICS: not done  FUNCTIONAL GAIT: Functional gait assessment: TBD M-CTSIB: TBD                                                                                                                            TREATMENT DATE: 02/21/22   Canalith Repositioning:  Epley Right:  Number of Reps: 2 Gaze Adaptation:   Habituation:   Other:   PATIENT EDUCATION: Education details: assessment details, rationale of intervention, indication for f/u Person educated: Patient Education method: Explanation Education comprehension: verbalized understanding  HOME EXERCISE PROGRAM:  GOALS: Goals reviewed with patient? Yes  SHORT TERM GOALS: Target date: same as LTG    LONG TERM GOALS: Target date: 03/22/2023    Patient will be independent in HEP to improve functional outcomes Baseline:  Goal status: INITIAL  2.  Pt to be free of positional dizziness to improve balance and reduce risk for falls Baseline: +R/L Dixhallpike (upbeating respectively); + left Roll test w/ geotropic Goal status: INITIAL  3.  Pt to report improved symptoms and greater activity participation per meeting FOTO requirements (56) Baseline: 39 Goal status: INITIAL  4.  Demo low risk for falls per score 24/30 Functional Gait Assessment to improve safety with community mobility Baseline: 19/30 03/09/23 Goal status: IN PROGRESS 03/09/23  5.  Pt to report neck pain not exceeding 2/10 during functional tasks to improve activity tolerance   Baseline: 4/10  Goal status: INITIAL  ASSESSMENT:  CLINICAL IMPRESSION: Patient arrived to session with report of 80-90% improvement in dizziness. Positional testing revealed remaining nystagmus and mild dizziness with R DH, treated with R Epley with improvement in sx. Balance testing showed slight improvement, however score still indicates an increased risk of falls. Trialed balance exercises and educated on performing safely at home. Patient reported understanding and without complaints at end of session.   OBJECTIVE IMPAIRMENTS: Abnormal gait, decreased activity tolerance, decreased balance, difficulty walking, decreased ROM, dizziness, increased muscle spasms, and pain.   ACTIVITY LIMITATIONS: carrying, lifting, bending, stairs, transfers, bed mobility,  reach over head, and locomotion level  PARTICIPATION LIMITATIONS: meal prep, cleaning, laundry, interpersonal relationship, shopping, yard work, and exercise routine/class  PERSONAL FACTORS: Time since onset of injury/illness/exacerbation and 1-2 comorbidities: PMH/ recent hx of head trauma  are also affecting patient's functional outcome.   REHAB POTENTIAL: Excellent  CLINICAL DECISION MAKING: Evolving/moderate complexity  EVALUATION COMPLEXITY: Moderate   PLAN:  PT FREQUENCY: 1-2x/week  PT DURATION: 4 weeks  PLANNED INTERVENTIONS: 97110-Therapeutic exercises, 97530- Therapeutic activity, 97112- Neuromuscular re-education, 97535- Self Care, 30865- Manual therapy, [phone]- Gait training, [phone]- Canalith repositioning, [phone]- Aquatic Therapy, 97014- Electrical stimulation (unattended), Patient/Family education, Balance training, Stair training, Taping, Dry Needling, Joint mobilization, Spinal mobilization, Vestibular training, DME instructions, Cryotherapy, and Moist heat  PLAN FOR NEXT SESSION: balance, habituation,  initiate treatment for neck pain     Baldemar Friday, PT, DPT 03/09/23 11:03 AM  [facility] at [facility] [address], [address] [place], [place] [address] Phone # [phone] Fax # [phone]

## 2023-03-08 ENCOUNTER — Encounter (HOSPITAL_COMMUNITY): Payer: Self-pay

## 2023-03-09 ENCOUNTER — Encounter: Payer: Self-pay | Admitting: Physical Therapy

## 2023-03-09 ENCOUNTER — Telehealth (HOSPITAL_COMMUNITY): Payer: Self-pay | Admitting: *Deleted

## 2023-03-09 ENCOUNTER — Ambulatory Visit: Payer: Medicare Other | Admitting: Physical Therapy

## 2023-03-09 DIAGNOSIS — R2681 Unsteadiness on feet: Secondary | ICD-10-CM

## 2023-03-09 DIAGNOSIS — R42 Dizziness and giddiness: Secondary | ICD-10-CM

## 2023-03-09 DIAGNOSIS — H8113 Benign paroxysmal vertigo, bilateral: Secondary | ICD-10-CM | POA: Diagnosis not present

## 2023-03-09 DIAGNOSIS — M542 Cervicalgia: Secondary | ICD-10-CM

## 2023-03-09 NOTE — Telephone Encounter (Signed)
 Attempted to call patient regarding upcoming cardiac PET appointment. Left message on voicemail with name and callback number Johney Frame RN Navigator Cardiac Imaging [facility] [phone] Office  Advised to avoid caffeine for 12 hours prior to test.

## 2023-03-10 ENCOUNTER — Encounter (HOSPITAL_COMMUNITY)
Admission: RE | Admit: 2023-03-10 | Discharge: 2023-03-10 | Disposition: A | Discharge status: Home / Self Care | Payer: Medicare Other | Source: Ambulatory Visit | Attending: Physician Assistant | Admitting: Physician Assistant

## 2023-03-10 DIAGNOSIS — R0602 Shortness of breath: Secondary | ICD-10-CM | POA: Diagnosis not present

## 2023-03-10 DIAGNOSIS — I480 Paroxysmal atrial fibrillation: Secondary | ICD-10-CM | POA: Insufficient documentation

## 2023-03-10 LAB — NM PET CT CARDIAC PERFUSION MULTI W/ABSOLUTE BLOODFLOW
MBFR: 2.83
Nuc Rest EF: 70 %
Nuc Stress EF: 77 %
Rest MBF: 0.81 ml/g/min
ST Depression (mm): 0 mm
Stress MBF: 2.29 ml/g/min

## 2023-03-10 MED ORDER — REGADENOSON 0.4 MG/5ML IV SOLN
0.4000 mg | Freq: Once | INTRAVENOUS | Status: AC
  Administered 2023-03-10: 0.4 mg via INTRAVENOUS

## 2023-03-10 MED ORDER — RUBIDIUM RB82 GENERATOR (RUBYFILL)
27.3300 | PACK | Freq: Once | INTRAVENOUS | Status: AC
  Administered 2023-03-10: 27.33 via INTRAVENOUS

## 2023-03-10 MED ORDER — RUBIDIUM RB82 GENERATOR (RUBYFILL)
27.3800 | PACK | Freq: Once | INTRAVENOUS | Status: AC
  Administered 2023-03-10: 27.38 via INTRAVENOUS

## 2023-03-10 MED ORDER — REGADENOSON 0.4 MG/5ML IV SOLN
INTRAVENOUS | Status: AC
  Filled 2023-03-10: qty 5

## 2023-03-10 NOTE — Therapy (Signed)
OUTPATIENT PHYSICAL THERAPY VESTIBULAR TREATMENT     Patient Name: Tonya Bartlett MRN: [id number] DOB:[birth year], 78 y.o., female Today's Date: 03/11/2023  END OF SESSION:  PT End of Session - 03/11/23 1229     Visit Number 5    Number of Visits 8    Date for PT Re-Evaluation 03/22/23    Authorization Type Medicare A/B    Progress Note Due on Visit 10    PT Start Time 1147    PT Stop Time 1228    PT Time Calculation (min) 41 min    Equipment Utilized During Treatment Gait belt    Activity Tolerance Patient tolerated treatment well    Behavior During Therapy WFL for tasks assessed/performed               Past Medical History:  Diagnosis Date   Alcohol abuse    Sober 41 years.   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  1996   History of hiatal hernia    History of iron deficiency anemia 1996   iron infusion   History of migraine headaches    History of radiation therapy    left breast 03/27/2021-04/23/2021 Dr Antony Blackbird   History of rib fracture    Hyperlipidemia    Hypertension    IBS (irritable bowel syndrome)    Insomnia    Joint pain    MCI (mild cognitive impairment)    Melanoma (HCC) 2019   left leg    Migraine headache    hx of migraines    OA (osteoarthritis)    RIGHT SHOULDER   Osteopenia    Personal history of radiation therapy    Pneumonia    remote history   PONV (postoperative nausea and vomiting)    Recovering  alcoholic (HCC)    SINCE 01-21-1980   Right rotator cuff tear    SOB (shortness of breath) on exertion    Substance abuse (HCC)    recovering alcoholic   Swallowing difficulty    Unspecified essential hypertension    Past Surgical History:  Procedure Laterality Date   ATRIAL FIBRILLATION ABLATION N/A 10/13/2022   Procedure: ATRIAL FIBRILLATION ABLATION;  Surgeon: Lanier Prude, MD;  Location: [facility];  Service: Cardiovascular;  Laterality: N/A;   BREAST BIOPSY Left 01/06/2021   pos   BREAST LUMPECTOMY     BREAST LUMPECTOMY WITH RADIOACTIVE SEED LOCALIZATION Left 02/27/2021   Procedure: LEFT BREAST LUMPECTOMY WITH RADIOACTIVE SEED LOCALIZATION;  Surgeon: Almond Lint, MD;  Location: [facility];  Service: General;  Laterality: Left;   BUNIONECTOMY/  HAMMERTOE CORRECTION  RIGHT FOOT  2011   CATARACT EXTRACTION W/ INTRAOCULAR LENS  IMPLANT, BILATERAL     COLONOSCOPY     DILATION AND CURETTAGE OF  UTERUS  1976   EYE SURGERY Bilateral 2016   cataract removal   KNEE ARTHROSCOPY W/ MENISCECTOMY Bilateral X2  LEFT /    X1  RIGHT   KNEE OPEN LATERAL RELEASE Bilateral    MOHS SURGERY Left 12/15/2017   Melanoma in situ - left calf - [facility]   ORIF ANKLE FRACTURE Right 08/04/2020   Procedure: OPEN REDUCTION INTERNAL FIXATION (ORIF) ANKLE FRACTURE;  Surgeon: Toni Arthurs, MD;  Location: [facility];  Service: Orthopedics;  Laterality: Right;  Mini C-arm, [employer] Small Frag   REPLACEMENT TOTAL KNEE Left 2006   SHOULDER ARTHROSCOPY WITH SUBACROMIAL DECOMPRESSION, ROTATOR CUFF REPAIR AND BICEP TENDON REPAIR Right 05/23/2013   Procedure: RIGHT SHOULDER ARTHROSCOPY EXAM UNDER ANESTHESIA  WITH SUBACROMIAL DECOMPRESSION,DISTAL CLAVICLE RESECTION, SADLABRAL DEBRIDEMENT CHONDROPLASTY, BICEP TENOTOMY ;  Surgeon: Eugenia Mcalpine, MD;  Location: 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      UPPER GASTROINTESTINAL ENDOSCOPY     UPPER GI ENDOSCOPY     VAGINAL HYSTERECTOMY  1976   Patient Active Problem List   Diagnosis Date Noted   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   Hypercoagulable state due to paroxysmal atrial fibrillation (HCC) 12/10/2022   BMI 33.0-33.9,adult 07/22/2022   BMI 32.0-32.9,adult 04/09/2022   Obesity, Beginning BMI 36.94 04/09/2022   B12 deficiency 01/29/2022   Pain of toe of right foot 12/25/2021   Pre-diabetes 11/05/2021   Essential hypertension 11/05/2021   Left knee pain 11/05/2021   Coronary artery calcification 08/07/2021   Vitamin D deficiency 07/29/2021   Insulin resistance 07/17/2021   Dysphagia 03/21/2021   Gastroesophageal reflux disease 03/21/2021   History of colon polyps 03/21/2021   Genetic testing 01/31/2021   Aortic atherosclerosis (HCC) 01/17/2021   Family history of melanoma 01/16/2021   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   PAF (paroxysmal atrial fibrillation) (HCC) 10/22/2020   Family history of heart disease 10/22/2020   Trimalleolar fracture 07/31/2020   Right hip OA 08/01/2019   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   Insomnia 02/03/2019   Onychomycosis 11/28/2018   Obese 05/05/2017   S/P left THA, 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   MCI (mild cognitive impairment) 09/19/2015   Memory deficit 09/19/2015   SIRS (systemic inflammatory response syndrome) (HCC) 10/29/2014   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   Depression 11/16/2013   ADD  (attention deficit disorder) 11/17/2011   Essential hypertension, benign 10/15/2009   SEBORRHEIC KERATOSIS, INFLAMED 10/17/2008   CARPAL TUNNEL SYNDROME, LEFT 06/22/2008   Allergic rhinitis 08/30/2007   Hyperlipidemia 11/11/2006   Migraine 11/11/2006   Osteoarthritis 11/11/2006    PCP: Shirline Frees, NP REFERRING PROVIDER: Shirline Frees, NP  REFERRING DIAG: H81.13 (ICD-10-CM) - Benign paroxysmal positional vertigo due to bilateral vestibular disorder  THERAPY DIAG:  Dizziness and giddiness  Unsteadiness on feet  Cervicalgia  ONSET DATE: 01/08/23  Rationale for Evaluation and Treatment: Rehabilitation  SUBJECTIVE:   SUBJECTIVE STATEMENT: Reports that  she had a cardiac CT yesterday- got the results already and it looked fine. Reports lethargy most days d/t trouble sleeping. Worked on some of her exercises. Reports that she has most neck pain with rotation and reports that she feels increased tension on L side. Gets HA's occasionally over the back of the head and sometimes over the forehead.   Pt accompanied by: self  PERTINENT HISTORY: 78 year old female who  has a past medical history of Alcohol abuse, Allergic rhinitis, Allergy, Anemia, Anxiety, Blood transfusion without reported diagnosis, Breast cancer (HCC) (12/2020), Cancer (HCC), Cataract, Clotting disorder (HCC), Colitis, Complication of anesthesia, Depression, Diverticulosis, Dysrhythmia, Fatigue, Frequency of urination, GERD (gastroesophageal reflux disease), History of colon polyps, History of DVT of lower extremity, History of hiatal hernia, History of iron deficiency anemia (1996), History of migraine headaches, History of radiation therapy, History of rib fracture, Hyperlipidemia, Hypertension, IBS (irritable bowel syndrome), Insomnia, Joint pain, MCI (mild cognitive impairment), Melanoma (HCC) (2019), Migraine headache, OA (osteoarthritis), Osteopenia, Pneumonia, PONV (postoperative nausea and vomiting), Recovering  alcoholic (HCC), Right rotator cuff tear, SOB (shortness of breath) on exertion, Substance abuse (HCC), Swallowing difficulty, and Unspecified essential hypertension.  She was seen on 01/08/2023 in the ER for a fall at home where she fell down and hit her head on a brick wall. She is on Eliquis and thankfully her CT head was negative for acute bleeding. She reports that since that time she has fallen at home in her bedroom two more times. The last time being a week ago when she turned around too quickly and became dizzy falling to the ground. The time before that she was bending over to pet her dog and became dizzy causing her to fall to the ground. She denies LOC or head injury. She reports that constantly when she moves to quickly to change positions, bends over or lays down she becomes dizzy. She has not had any syncopal epsidoes.   PAIN:  Are you having pain? Yes: NPRS scale: 0/10 Pain location: neck L > R Pain description: aches, sore, limited with movement Aggravating factors: extension, rotation, lateral flexion Relieving factors: rest, heat  PRECAUTIONS: Fall  RED FLAGS: None   WEIGHT BEARING RESTRICTIONS: No  FALLS: Has patient fallen in last 6 months? Yes. Number of falls 3-4  LIVING ENVIRONMENT: Lives with: lives alone Lives in: House/apartment Stairs: Yes: External: 4 steps; bilateral but cannot reach both Has following equipment at home: Single point cane  PLOF: Independent  PATIENT GOALS: eliminate symptoms  OBJECTIVE:    TODAY'S TREATMENT: 03/11/23 Activity Comments  review of HEP: brandt daroff 3x  standing hip ABD 10x each tandem walk at counter 30" romberg + head turns/nods 30" romberg EC + head turns/nods 30" C/o delayed dizziness upon sitting up from L BD. Cues to engage hips and core with balance activities for improved stability. Reported some neck pain with head movements thus encouraged to move within tolerable ROM  Cervical rotation SNAG 10x each   encouraged to move within tolerable ROM  Palpation  Soft tissue restriction L>R UT, LS, cervical paraspinals, suboccipitals   Cervical retraction  10x3" Good form after education/demo  D2 flexion to cone on floor 5x  No dizziness; CGA and pt reports "challenging on the knees"     HOME EXERCISE PROGRAM Last updated: 03/11/23 Access Code: [email or web address] URL: [email or web address] Date: 03/11/2023 Prepared by: [facility] - [facility]  Exercises - Brandt-Daroff Vestibular Exercise  - 1 x daily - 5  x weekly - 2 sets - 10 reps - Standing Hip Abduction with Counter Support  - 1 x daily - 5 x weekly - 2 sets - 10 reps - Tandem Walking with Counter Support  - 1 x daily - 5 x weekly - 2 sets - 10 reps - Narrow Stance with Eyes Closed and Head Nods  - 1 x daily - 5 x weekly - 2 sets - 30 sec hold - Narrow Stance with Eyes Closed and Head Rotation  - 1 x daily - 5 x weekly - 2 sets - 30 sec hold - Seated Assisted Cervical Rotation with Towel  - 1 x daily - 5 x weekly - 2 sets - 10 reps - Cervical Retraction with Overpressure  - 1 x daily - 5 x weekly - 2 sets - 10 reps - 3 sec hold   PATIENT EDUCATION: Education details: HEP update Person educated: Patient Education method: Explanation, Demonstration, Tactile cues, Verbal cues, and Handouts Education comprehension: verbalized understanding and returned demonstration      Note: Objective measures were completed at Evaluation unless otherwise noted.  DIAGNOSTIC FINDINGS: had imaging at hospital to rule out intracranial issues, negative results  COGNITION: Overall cognitive status: Within functional limits for tasks assessed   SENSATION: Not tested  EDEMA:  None noted    POSTURE:  No Significant postural limitations  Cervical ROM:    Active A/PROM (deg) eval  Flexion 45  Extension 45 w/ left side neck pain  Right lateral flexion 20 w/ referred symptoms right scap  Left lateral flexion 30   Right rotation 35  Left rotation 35  (Blank rows = not tested)    BED MOBILITY:  indep  TRANSFERS: Assistive device utilized: None  Sit to stand: Complete Independence and Modified independence Stand to sit: Complete Independence Chair to chair: Complete Independence Floor:  NT    CURB: NT  GAIT: Gait pattern:  unsteady, lateral LOB Distance walked:  Assistive device utilized: None Level of assistance: Complete Independence Comments:     PATIENT SURVEYS:  FOTO 39 (predicted 56)  VESTIBULAR ASSESSMENT:  GENERAL OBSERVATION: recent hx of left scalp laceration from fall/striking head   SYMPTOM BEHAVIOR:  Subjective history: fell striking head 11/22 and experiencing positional dizziness since tha ttime  Non-Vestibular symptoms: neck pain  Type of dizziness: Spinning/Vertigo  Frequency: daily/positional changes  Duration: seconds-minutes  Aggravating factors: Induced by position change: rolling to the right, rolling to the left, supine to sit, and sit to stand and Induced by motion: looking up at the ceiling, bending down to the ground, turning body quickly, turning head quickly, and activity in general  Relieving factors: head stationary and slow movements  Progression of symptoms: unchanged  OCULOMOTOR EXAM:  Ocular Alignment: normal  Ocular ROM: No Limitations  Spontaneous Nystagmus: absent  Gaze-Induced Nystagmus: absent  Smooth Pursuits: intact  Saccades: hypometric/undershoots  Convergence/Divergence: 3 cm   No provocation of symptoms with oculomotor or VOR exam   VESTIBULAR - OCULAR REFLEX:   Slow VOR: Normal  VOR Cancellation: Normal  Head-Impulse Test: HIT Right: negative HIT Left: negative  Dynamic Visual Acuity: Not able to be assessed   POSITIONAL TESTING: Right Dix-Hallpike: upbeating, right nystagmus Left Dix-Hallpike: upbeating, left nystagmus Right Roll Test: no nystagmus Left Roll Test: geotropic nystagmus  MOTION  SENSITIVITY:  Motion Sensitivity Quotient Intensity: 0 = none, 1 = Lightheaded, 2 = Mild, 3 = Moderate, 4 = Severe, 5 = Vomiting  Intensity  1. Sitting to supine  2. Supine to L side   3. Supine to R side   4. Supine to sitting   5. L Hallpike-Dix   6. Up from L    7. R Hallpike-Dix   8. Up from R    9. Sitting, head tipped to L knee   10. Head up from L knee   11. Sitting, head tipped to R knee   12. Head up from R knee   13. Sitting head turns x5   14.Sitting head nods x5   15. In stance, 180 turn to L    16. In stance, 180 turn to R     OTHOSTATICS: not done  FUNCTIONAL GAIT: Functional gait assessment: TBD M-CTSIB: TBD                                                                                                                            TREATMENT DATE: 02/21/22   Canalith Repositioning:  Epley Right: Number of Reps: 2 Gaze Adaptation:   Habituation:   Other:   PATIENT EDUCATION: Education details: assessment details, rationale of intervention, indication for f/u Person educated: Patient Education method: Explanation Education comprehension: verbalized understanding  HOME EXERCISE PROGRAM:  GOALS: Goals reviewed with patient? Yes  SHORT TERM GOALS: Target date: same as LTG    LONG TERM GOALS: Target date: 03/22/2023    Patient will be independent in HEP to improve functional outcomes Baseline:  Goal status: INITIAL  2.  Pt to be free of positional dizziness to improve balance and reduce risk for falls Baseline: +R/L Dixhallpike (upbeating respectively); + left Roll test w/ geotropic Goal status: INITIAL  3.  Pt to report improved symptoms and greater activity participation per meeting FOTO requirements (56) Baseline: 39 Goal status: INITIAL  4.  Demo low risk for falls per score 24/30 Functional Gait Assessment to improve safety with community mobility Baseline: 19/30 03/09/23 Goal status: IN PROGRESS 03/09/23  5.  Pt to report neck pain not  exceeding 2/10 during functional tasks to improve activity tolerance   Baseline: 4/10  Goal status: INITIAL  ASSESSMENT:  CLINICAL IMPRESSION: Patient arrived to session with report of lethargy and undergoing workup for cardiac causes. Reviewed HEP update from last session which revealed remaining motion sensitivity sitting up from L side. Provided cues on static and dynamic balance challenges with good improvement in stability after cues to contract core and hips. Patient reported good tolerance with cervical stretching today. Progressing well towards goals. No complaints upon leaving with exception of unchanged fatigue.   OBJECTIVE IMPAIRMENTS: Abnormal gait, decreased activity tolerance, decreased balance, difficulty walking, decreased ROM, dizziness, increased muscle spasms, and pain.   ACTIVITY LIMITATIONS: carrying, lifting, bending, stairs, transfers, bed mobility, reach over head, and locomotion level  PARTICIPATION LIMITATIONS: meal prep, cleaning, laundry, interpersonal relationship, shopping, yard work, and exercise routine/class  PERSONAL FACTORS: Time since onset of injury/illness/exacerbation and 1-2 comorbidities: PMH/ recent hx of head trauma  are also affecting patient's  functional outcome.   REHAB POTENTIAL: Excellent  CLINICAL DECISION MAKING: Evolving/moderate complexity  EVALUATION COMPLEXITY: Moderate   PLAN:  PT FREQUENCY: 1-2x/week  PT DURATION: 4 weeks  PLANNED INTERVENTIONS: 97110-Therapeutic exercises, 97530- Therapeutic activity, 97112- Neuromuscular re-education, 97535- Self Care, 84132- Manual therapy, [phone]- Gait training, [phone]- Canalith repositioning, [phone]- Aquatic Therapy, 97014- Electrical stimulation (unattended), Patient/Family education, Balance training, Stair training, Taping, Dry Needling, Joint mobilization, Spinal mobilization, Vestibular training, DME instructions, Cryotherapy, and Moist heat  PLAN FOR NEXT SESSION: balance, habituation,  continue treatment for neck pain     Baldemar Friday, PT, DPT 03/11/23 12:32 PM  [facility] at [facility] [address] [place], [place] [address] Phone # [phone] Fax # [phone]

## 2023-03-11 ENCOUNTER — Encounter: Payer: Self-pay | Admitting: Physical Therapy

## 2023-03-11 ENCOUNTER — Ambulatory Visit: Payer: Medicare Other | Admitting: Physical Therapy

## 2023-03-11 DIAGNOSIS — R42 Dizziness and giddiness: Secondary | ICD-10-CM | POA: Diagnosis not present

## 2023-03-11 DIAGNOSIS — R2681 Unsteadiness on feet: Secondary | ICD-10-CM | POA: Diagnosis not present

## 2023-03-11 DIAGNOSIS — M542 Cervicalgia: Secondary | ICD-10-CM | POA: Diagnosis not present

## 2023-03-11 DIAGNOSIS — H8113 Benign paroxysmal vertigo, bilateral: Secondary | ICD-10-CM | POA: Diagnosis not present

## 2023-03-15 ENCOUNTER — Encounter (HOSPITAL_COMMUNITY): Payer: Self-pay | Admitting: Psychiatry

## 2023-03-15 ENCOUNTER — Telehealth (INDEPENDENT_AMBULATORY_CARE_PROVIDER_SITE_OTHER): Payer: Medicare Other | Admitting: Psychiatry

## 2023-03-15 DIAGNOSIS — F5102 Adjustment insomnia: Secondary | ICD-10-CM | POA: Diagnosis not present

## 2023-03-15 DIAGNOSIS — F1021 Alcohol dependence, in remission: Secondary | ICD-10-CM | POA: Diagnosis not present

## 2023-03-15 DIAGNOSIS — F331 Major depressive disorder, recurrent, moderate: Secondary | ICD-10-CM | POA: Diagnosis not present

## 2023-03-15 MED ORDER — BUPROPION HCL ER (SR) 100 MG PO TB12: 100.0000 mg | ORAL_TABLET | Freq: Every day | ORAL | 0 refills | Status: DC

## 2023-03-15 NOTE — Progress Notes (Signed)
[facility] Outpatient visit Tele psych  Patient Identification: Tonya Bartlett MRN:  [id number] Date of Evaluation:  03/15/2023 Referral Source: NP Chief Complaint:   Depression follow up  Visit Diagnosis:    ICD-10-CM   1. Major depressive disorder, recurrent episode, moderate (HCC)  F33.1     2. Adjustment insomnia  F51.02     3. Alcohol dependence in remission [facility])  F10.21     Virtual Visit via Video Note  I connected with Tonya Bartlett on 03/15/23 at  1:30 PM EST by a video enabled telemedicine application and verified that I am speaking with the correct person using two identifiers.  Location: Patient: home Provider: home office   I discussed the limitations of evaluation and management by telemedicine and the availability of in person appointments. The patient expressed understanding and agreed to proceed.     I discussed the assessment and treatment plan with the patient. The patient was provided an opportunity to ask questions and all were answered. The patient agreed with the plan and demonstrated an understanding of the instructions.   The patient was advised to call back or seek an in-person evaluation if the symptoms worsen or if the condition fails to improve as anticipated.  I provided 20 minutes of non-face-to-face time during this encounter.       History of Present Illness: 78  years old currently widowed white female lives by herself has 3 grown kids and 6 grandkids referred initially by family medicine for management of depression  Remains sober 40 plus years, attends and chair AA groups  Has had breast surgery daignosed with cancer, recovering, on radiation.   On tomoxifen Last visit was doing fair some adjustment of effexor to 150mg  done Now feeling subdued, more concern with being obese. Says pcp has stated weight loss med would cost a lot  Has missed AA at times this week feel being obese gives guild or makes her down  Modifying factors :f family,   daughter is now communicating aggravating factors. Hip surgery,  breast surgery, medical conditions Severity  subdued  Past Psychiatric History: depression  Previous Psychotropic Medications: Yes   Substance Abuse History in the last 12 months:  No.  Consequences of Substance Abuse: NA  Past Medical History:  Past Medical History:  Diagnosis Date   Alcohol abuse    Sober 41 years.   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  1996   History of hiatal hernia    History of iron deficiency anemia 1996   iron infusion   History of migraine headaches    History of radiation therapy    left breast 03/27/2021-04/23/2021 Dr Antony Blackbird   History of rib fracture    Hyperlipidemia    Hypertension  IBS (irritable bowel syndrome)    Insomnia    Joint pain    MCI (mild cognitive impairment)    Melanoma (HCC) 2019   left leg    Migraine headache    hx of migraines    OA (osteoarthritis)    RIGHT SHOULDER   Osteopenia    Personal history of radiation therapy    Pneumonia    remote history   PONV (postoperative nausea and vomiting)    Recovering alcoholic (HCC)    SINCE 01-21-1980   Right rotator cuff tear    SOB (shortness of breath) on exertion    Substance abuse (HCC)    recovering alcoholic   Swallowing difficulty    Unspecified essential hypertension     Past Surgical History:  Procedure Laterality Date   ATRIAL FIBRILLATION ABLATION N/A  10/13/2022   Procedure: ATRIAL FIBRILLATION ABLATION;  Surgeon: Lanier Prude, MD;  Location: [facility] IN[facility]IVE [facility];  Service: Cardiovascular;  Laterality: N/A;   BREAST BIOPSY Left 01/06/2021   pos   BREAST LUMPECTOMY     BREAST LUMPECTOMY WITH RADIOACTIVE SEED LOCALIZATION Left 02/27/2021   Procedure: LEFT BREAST LUMPECTOMY WITH RADIOACTIVE SEED LOCALIZATION;  Surgeon: Almond Lint, MD;  Location: [facility] OR;  Service: General;  Laterality: Left;   BUNIONECTOMY/  HAMMERTOE CORRECTION  RIGHT FOOT  2011   CATARACT EXTRACTION W/ INTRAOCULAR LENS  IMPLANT, BILATERAL     COLONOSCOPY     DILATION AND CURETTAGE OF UTERUS  1976   EYE SURGERY Bilateral 2016   cataract removal   KNEE ARTHROSCOPY W/ MENISCECTOMY Bilateral X2  LEFT /    X1  RIGHT   KNEE OPEN LATERAL RELEASE Bilateral    MOHS SURGERY Left 12/15/2017   Melanoma in situ - left calf - [facility]   ORIF ANKLE FRACTURE Right 08/04/2020   Procedure: OPEN REDUCTION INTERNAL FIXATION (ORIF) ANKLE FRACTURE;  Surgeon: Toni Arthurs, MD;  Location: [facility] ORS;  Service: Orthopedics;  Laterality: Right;  Mini C-arm, [employer] Small Frag   REPLACEMENT TOTAL KNEE Left 2006   SHOULDER ARTHROSCOPY WITH SUBACROMIAL DECOMPRESSION, ROTATOR CUFF REPAIR AND BICEP TENDON REPAIR Right 05/23/2013   Procedure: RIGHT SHOULDER ARTHROSCOPY EXAM UNDER ANESTHESIA  WITH SUBACROMIAL DECOMPRESSION,DISTAL CLAVICLE RESECTION, SADLABRAL DEBRIDEMENT CHONDROPLASTY, BICEP TENOTOMY ;  Surgeon: Eugenia Mcalpine, MD;  Location: [facility];  Service: Orthopedics;  Laterality: Right;   TONSILLECTOMY AND ADENOIDECTOMY  AGE 47   TOTAL HIP ARTHROPLASTY Left 05/04/2017   Procedure: LEFT TOTAL HIP ARTHROPLASTY ANTERIOR APPROACH;  Surgeon: Durene Romans, MD;  Location: [facility];  Service: Orthopedics;  Laterality: Left;   TOTAL HIP ARTHROPLASTY Right 08/01/2019   Procedure: TOTAL HIP ARTHROPLASTY ANTERIOR APPROACH;  Surgeon: Durene Romans, MD;  Location: [facility];   Service: Orthopedics;  Laterality: Right;    TOTAL KNEE ARTHROPLASTY Bilateral LEFT  1996/   RIGHT 2004   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      UPPER GASTROINTESTINAL ENDOSCOPY     UPPER GI ENDOSCOPY     VAGINAL HYSTERECTOMY  1976    Family Psychiatric History: mom possible depression  Family History:  Family History  Problem Relation Age of Onset   Cancer Mother    Heart disease Mother    Hypertension Mother    Melanoma Mother 74   Obesity Mother    Heart disease Father 52   Hypertension Father    Esophageal cancer Brother    Dementia Paternal Grandfather    Melanoma Niece 59  Colon cancer Neg Hx    Colon polyps Neg Hx    Stomach cancer Neg Hx    Rectal cancer Neg Hx     Social History:   Social History   Socioeconomic History   Marital status: Widowed    Spouse name: Not on file   Number of children: 3   Years of education: Not on file   Highest education level: Master's degree (e.g., MA, MS, MEng, MEd, MSW, MBA)  Occupational History   Occupation: retired Paramedic  Tobacco Use   Smoking status: Former    Current packs/day: 0.00    Average packs/day: 0.5 packs/day for 15.0 years (7.5 ttl pk-yrs)    Types: Cigarettes    Start date: 05/16/1970    Quit date: 05/15/1985    Years since quitting: 37.8   Smokeless tobacco: Never   Tobacco comments:    Former smoker 12/10/22  Vaping Use   Vaping status: Never Used  Substance and Sexual Activity   Alcohol use: No    Comment: RECOVERING ALCOHOLIC--  QUIT [id number]   Drug use: No   Sexual activity: Not Currently    Birth control/protection: Post-menopausal  Other Topics Concern   Not on file  Social History Narrative   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    Widowed    Three kids    6 grandchildren       Social Drivers of Corporate investment banker Strain: Low Risk  (04/18/2021)   Overall Financial Resource Strain (CARDIA)    Difficulty of Paying Living  Expenses: Not hard at all  Food Insecurity: Low Risk  (09/04/2022)   Received from [facility]   Hunger Vital Sign    Worried About Running Out of Food in the Last Year: Never true    Ran Out of Food in the Last Year: Never true  Transportation Needs: Not on file (09/04/2022)  Physical Activity: Inactive (04/18/2021)   Exercise Vital Sign    Days of Exercise per Week: 0 days    Minutes of Exercise per Session: 0 min  Stress: No Stress Concern Present (04/18/2021)   [employer] of Occupational Health - Occupational Stress Questionnaire    Feeling of Stress : Not at all  Social Connections: Moderately Integrated (04/18/2021)   Social Connection and Isolation Panel [NHANES]    Frequency of Communication with Friends and Family: More than three times a week    Frequency of Social Gatherings with Friends and Family: More than three times a week    Attends Religious Services: More than 4 times per year    Active Member of [employer] or Organizations: Yes    Attends Banker Meetings: More than 4 times per year    Marital Status: Widowed     Allergies:   Allergies  Allergen Reactions   Nsaids Other (See Comments) and Anaphylaxis    SEVERE STOMACH CRAMPS, MOUTH SORES  **Able to tolerate Tylenol   Otezla [Apremilast] Anaphylaxis    Suicidal ideation   Penicillins Anaphylaxis and Other (See Comments)    Has patient had a PCN reaction causing immediate rash, facial/tongue/throat swelling, SOB or lightheadedness with hypotension:  Yes  Has patient had a PCN reaction causing severe rash involving mucus membranes or skin necrosis: Yes  Has patient had a PCN reaction that required hospitalization: No  Has patient had a PCN reaction occurring within the last 10 year No  If all of the above answers are "  NO", then may proceed with Cephalosporin use.   Erythromycin Other (See Comments)    SEVERE STOMACH CRAMPS   Infliximab Other (See Comments)    Shut down immune system-BP  high  Other Reaction(s): Not available   Morphine And Codeine Nausea And Vomiting   Penicillamine Other (See Comments)   Risankizumab Other (See Comments)    URI, fever, cough, UTI Skyrizi   Tolmetin     SEVERE STOMACH CRAMPS   Nickel Rash    Including snaps on hospital gowns     Metabolic Disorder Labs: Lab Results  Component Value Date   HGBA1C 5.3 01/29/2022   No results found for: "PROLACTIN" Lab Results  Component Value Date   CHOL 147 01/29/2022   TRIG 84 01/29/2022   HDL 61 01/29/2022   CHOLHDL 2.7 06/11/2021   VLDL 96.2 04/24/2021   LDLCALC 70 01/29/2022   LDLCALC 90 06/11/2021     Current Medications: Current Outpatient Medications  Medication Sig Dispense Refill   buPROPion ER (WELLBUTRIN SR) 100 MG 12 hr tablet Take 1 tablet (100 mg total) by mouth daily. 30 tablet 0   acetaminophen (TYLENOL) 325 MG tablet Take 650 mg by mouth every 6 (six) hours as needed for moderate pain.     apixaban (ELIQUIS) 5 MG TABS tablet Take 1 tablet (5 mg total) by mouth 2 (two) times daily. 84 tablet    Ascorbic Acid (VITAMIN C) 1000 MG tablet Take 1,000 mg by mouth daily.     atorvastatin (LIPITOR) 40 MG tablet TAKE 1 TABLET BY MOUTH EVERY DAY 90 tablet 3   betamethasone dipropionate 0.05 % lotion APPLY TOPICALLY TWICE A DAY 60 mL 0   betamethasone, augmented, (DIPROLENE) 0.05 % lotion Apply topically 2 (two) times daily. 60 mL 0   HYDROcodone-acetaminophen (NORCO/VICODIN) 5-325 MG tablet Take 2 tablets by mouth every 4 (four) hours as needed for moderate pain or severe pain.     hydrocortisone cream 1 % Apply 1 application topically daily as needed for itching.     L-Methylfolate-B12-B6-B2 (CEREFOLIN) 07-17-48-5 MG TABS TAKE 1 TABLET BY MOUTH DAILY 90 tablet 0   losartan (COZAAR) 100 MG tablet Take 1 tablet (100 mg total) by mouth daily. 90 tablet 3   meclizine (ANTIVERT) 25 MG tablet Take 1 tablet (25 mg total) by mouth 3 (three) times daily as needed for dizziness. 30 tablet 0    metoprolol tartrate (LOPRESSOR) 25 MG tablet TAKE 0.5 TABLETS BY MOUTH 2 TIMES DAILY. 90 tablet 1   omeprazole (PRILOSEC) 40 MG capsule TAKE 1 CAPSULE BY MOUTH 2 (TWO) TIMES DAILY. TAKE 30 MINUTES BEFORE BREAKFAST & DINNER. 180 capsule 1   ondansetron (ZOFRAN) 4 MG tablet Take 1 tablet (4 mg total) by mouth every 8 (eight) hours as needed for nausea or vomiting. 20 tablet 0   Soft Lens Products (REWETTING DROPS) SOLN Place 1 drop into both eyes daily as needed (Dry eyes).     sulfamethoxazole-trimethoprim (BACTRIM,SEPTRA) 400-80 MG tablet Take 1 tablet by mouth at bedtime. For urethritis.     tamoxifen (NOLVADEX) 10 MG tablet Take 1/2 tablet by mouth in the evening 90 tablet 1   traZODone (DESYREL) 50 MG tablet TAKE 1 TABLET BY MOUTH EVERYDAY AT BEDTIME 90 tablet 1   venlafaxine XR (EFFEXOR-XR) 150 MG 24 hr capsule TAKE 1 CAPSULE BY MOUTH EVERY DAY 90 capsule 0   vitamin E 180 MG (400 UNITS) capsule Take 400 Units by mouth daily.     No current facility-administered medications  for this visit.     Psychiatric Specialty Exam: Review of Systems  Cardiovascular:  Negative for chest pain.  Psychiatric/Behavioral:  Positive for depression. Negative for suicidal ideas.     There were no vitals taken for this visit.There is no height or weight on file to calculate BMI.  General Appearance: casual  Eye Contact:  fair  Speech:  Normal Rate  Volume:  Decreased  Mood: subdued  Affect: congruent  Thought Process:  Goal Directed  Orientation:  Full (Time, Place, and Person)  Thought Content:  Rumination  Suicidal Thoughts:  No  Homicidal Thoughts:  No  Memory:  Immediate;   Fair Recent;   Fair  Judgement:  Fair  Insight:  Fair  Psychomotor Activity:  Normal  Concentration:  Concentration: Fair and Attention Span: Fair  Recall:  [employer] of Knowledge:Good  Language: Good  Akathisia:  No  Handed:  Right  AIMS (if indicated):    Assets:  Desire for Improvement  ADL's:  Intact   Cognition: Impaired,  Mild  Sleep:  Variable to fair on meds    Treatment Plan Summary: Medication management and Plan as follows    Prior documentation reviewed   1. MDD, mild to moderate: somewhat subdued, continue effexor will add wellbutrin small dose , patient will connect with Oncology for any concerns of adding med  2. Insomnia: reviewed sleep hygiene,   Alcohol dependence:sustained remission, stable continue AA and encourgaed not to miss  Add activiites to keep busy and distracted from negative thoughts  Patient to connect with PCP in regard to weight loss meds  Fu 2- 3 m.     Thresa Ross, MD 1/27/20251:58 PM

## 2023-03-16 ENCOUNTER — Ambulatory Visit: Payer: Medicare Other

## 2023-03-18 ENCOUNTER — Ambulatory Visit: Payer: Medicare Other

## 2023-03-18 DIAGNOSIS — M7062 Trochanteric bursitis, left hip: Secondary | ICD-10-CM | POA: Diagnosis not present

## 2023-03-19 ENCOUNTER — Ambulatory Visit (INDEPENDENT_AMBULATORY_CARE_PROVIDER_SITE_OTHER): Payer: Medicare Other | Admitting: Family Medicine

## 2023-03-19 ENCOUNTER — Encounter: Payer: Self-pay | Admitting: Family Medicine

## 2023-03-19 VITALS — BP 144/66 | HR 64 | Temp 98.4°F | Ht 66.0 in | Wt 234.4 lb

## 2023-03-19 DIAGNOSIS — R059 Cough, unspecified: Secondary | ICD-10-CM | POA: Diagnosis not present

## 2023-03-19 LAB — POCT INFLUENZA A/B
Influenza A, POC: NEGATIVE
Influenza B, POC: NEGATIVE

## 2023-03-19 MED ORDER — BENZONATATE 100 MG PO CAPS: 100.0000 mg | ORAL_CAPSULE | Freq: Three times a day (TID) | ORAL | 0 refills | Status: DC | PRN

## 2023-03-19 MED ORDER — HYDROCOD POLI-CHLORPHE POLI ER 10-8 MG/5ML PO SUER: 5.0000 mL | Freq: Two times a day (BID) | ORAL | 0 refills | Status: DC | PRN

## 2023-03-19 MED ORDER — AZITHROMYCIN 250 MG PO TABS: ORAL_TABLET | ORAL | 0 refills | Status: DC

## 2023-03-19 NOTE — Progress Notes (Unsigned)
Subjective:     Patient ID: Tonya Bartlett, female    DOB: 09/06/1945, 78 y.o.   MRN: [id number]  Chief Complaint  Patient presents with   Cough    Pt c/o of cough since Monday, has gotten worse yesterday. Pt does not have any mucous pt c.o of headache, feeling tired. Pt tested COVID negative at home on Monday.    Sore Throat    Pt c/o of sore throat on Monday, scs better today.     HPI Discussed the use of AI scribe software for clinical note transcription with the patient, who gave verbal consent to proceed.  History of Present Illness   The patient presents with a persistent cough and shortness of breath.  She has been experiencing a persistent cough that began after a sore throat resolved on its own. The cough has worsened over the last two days, significantly affecting her sleep, causing her to sleep in a recliner at night. She has been using an old bottle of Tussionex cough syrup from 2018, which contains hydrocodone, and is cautious about timing its use with Vicodin.  She reports ongoing shortness of breath for a couple of months, with a report suggesting possible emphysema. She is scheduled to discuss test results with Dr. Tereso Newcomer. The shortness of breath has not worsened with her current illness. She tested negative for COVID-19 on Monday and denies having fevers or body aches.  She is currently on a dose pack for bursitis in her left hip and occasionally takes Vicodin at night for hip pain.  She is allergic to penicillin, which causes welts and swelling of the mouth and tongue. She also reports reactions to NSAIDs, which cause mouth sores, and has been prescribed topical Voltaren, which also resulted in mouth sores.  She has a history of falls, including a recent incident where she hit her head on bricks in her garage, resulting in stitches.       Health Maintenance Due  Topic Date Due   Medicare Annual Wellness (AWV)  04/21/2023    Past Medical History:   Diagnosis Date   Alcohol abuse    Sober 41 years.   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  1996   History of hiatal hernia    History of iron deficiency anemia 1996   iron infusion   History of migraine headaches    History of radiation therapy    left breast 03/27/2021-04/23/2021 Dr Antony Blackbird   History of rib fracture    Hyperlipidemia    Hypertension    IBS (irritable bowel syndrome)    Insomnia    Joint pain    MCI (mild cognitive impairment)    Melanoma (HCC) 2019   left leg  Migraine headache    hx of migraines    OA (osteoarthritis)    RIGHT SHOULDER   Osteopenia    Personal history of radiation therapy    Pneumonia    remote history   PONV (postoperative nausea and vomiting)    Recovering alcoholic (HCC)    SINCE 01-21-1980   Right rotator cuff tear    SOB (shortness of breath) on exertion    Substance abuse (HCC)    recovering alcoholic   Swallowing difficulty    Unspecified essential hypertension     Past Surgical History:  Procedure Laterality Date   ATRIAL FIBRILLATION ABLATION N/A 10/13/2022   Procedure: ATRIAL FIBRILLATION ABLATION;  Surgeon: Lanier Prude, MD;  Location: [facility];  Service: Cardiovascular;  Laterality: N/A;   BREAST BIOPSY Left 01/06/2021   pos   BREAST LUMPECTOMY     BREAST LUMPECTOMY WITH RADIOACTIVE SEED LOCALIZATION Left 02/27/2021   Procedure: LEFT BREAST LUMPECTOMY WITH RADIOACTIVE  SEED LOCALIZATION;  Surgeon: Almond Lint, MD;  Location: [facility];  Service: General;  Laterality: Left;   BUNIONECTOMY/  HAMMERTOE CORRECTION  RIGHT FOOT  2011   CATARACT EXTRACTION W/ INTRAOCULAR LENS  IMPLANT, BILATERAL     COLONOSCOPY     DILATION AND CURETTAGE OF UTERUS  1976   EYE SURGERY Bilateral 2016   cataract removal   KNEE ARTHROSCOPY W/ MENISCECTOMY Bilateral X2  LEFT /    X1  RIGHT   KNEE OPEN LATERAL RELEASE Bilateral    MOHS SURGERY Left 12/15/2017   Melanoma in situ - left calf - [facility]   ORIF ANKLE FRACTURE Right 08/04/2020   Procedure: OPEN REDUCTION INTERNAL FIXATION (ORIF) ANKLE FRACTURE;  Surgeon: Toni Arthurs, MD;  Location: [facility];  Service: Orthopedics;  Laterality: Right;  Mini C-arm, [employer] Small Frag   REPLACEMENT TOTAL KNEE Left 2006   SHOULDER ARTHROSCOPY WITH SUBACROMIAL DECOMPRESSION, ROTATOR CUFF REPAIR AND BICEP TENDON REPAIR Right 05/23/2013   Procedure: RIGHT SHOULDER ARTHROSCOPY EXAM UNDER ANESTHESIA  WITH SUBACROMIAL DECOMPRESSION,DISTAL CLAVICLE RESECTION, SADLABRAL DEBRIDEMENT CHONDROPLASTY, BICEP TENOTOMY ;  Surgeon: Eugenia Mcalpine, MD;  Location: [facility];  Service: Orthopedics;  Laterality: Right;   TONSILLECTOMY AND ADENOIDECTOMY  AGE 61   TOTAL HIP ARTHROPLASTY Left 05/04/2017   Procedure: LEFT TOTAL HIP ARTHROPLASTY ANTERIOR APPROACH;  Surgeon: Durene Romans, MD;  Location: [facility];  Service: Orthopedics;  Laterality: Left;   TOTAL HIP ARTHROPLASTY Right 08/01/2019   Procedure: TOTAL HIP ARTHROPLASTY ANTERIOR APPROACH;  Surgeon: Durene Romans, MD;  Location: [facility];  Service: Orthopedics;  Laterality: Right;    TOTAL KNEE ARTHROPLASTY Bilateral LEFT  1996/   RIGHT 2004   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      UPPER GASTROINTESTINAL ENDOSCOPY     UPPER GI ENDOSCOPY     VAGINAL HYSTERECTOMY  1976     Current Outpatient Medications:    acetaminophen (TYLENOL) 325 MG tablet, Take 650 mg by mouth every 6  (six) hours as needed for moderate pain., Disp: , Rfl:    apixaban (ELIQUIS) 5 MG TABS tablet, Take 1 tablet (5 mg total) by mouth 2 (two) times daily., Disp: 84 tablet, Rfl:    Ascorbic Acid (VITAMIN C) 1000 MG tablet, Take 1,000 mg by mouth daily., Disp: , Rfl:    atorvastatin (LIPITOR) 40 MG tablet, TAKE 1 TABLET BY MOUTH EVERY DAY, Disp: 90 tablet, Rfl: 3   betamethasone dipropionate 0.05 % lotion, APPLY TOPICALLY TWICE A DAY, Disp: 60  mL, Rfl: 0   betamethasone, augmented, (DIPROLENE) 0.05 % lotion, Apply topically 2 (two) times daily., Disp: 60 mL, Rfl: 0   buPROPion ER (WELLBUTRIN SR) 100 MG 12 hr tablet, Take 1 tablet (100 mg total) by mouth daily., Disp: 30 tablet, Rfl: 0   HYDROcodone-acetaminophen (NORCO/VICODIN) 5-325 MG tablet, Take 2 tablets by mouth every 4 (four) hours as needed for moderate pain or severe pain., Disp: , Rfl:    hydrocortisone cream 1 %, Apply 1 application topically daily as needed for itching., Disp: , Rfl:    L-Methylfolate-B12-B6-B2 (CEREFOLIN) 07-17-48-5 MG TABS, TAKE 1 TABLET BY MOUTH DAILY, Disp: 90 tablet, Rfl: 0   losartan (COZAAR) 100 MG tablet, Take 1 tablet (100 mg total) by mouth daily., Disp: 90 tablet, Rfl: 3   meclizine (ANTIVERT) 25 MG tablet, Take 1 tablet (25 mg total) by mouth 3 (three) times daily as needed for dizziness., Disp: 30 tablet, Rfl: 0   metoprolol tartrate (LOPRESSOR) 25 MG tablet, TAKE 0.5 TABLETS BY MOUTH 2 TIMES DAILY., Disp: 90 tablet, Rfl: 1   omeprazole (PRILOSEC) 40 MG capsule, TAKE 1 CAPSULE BY MOUTH 2 (TWO) TIMES DAILY. TAKE 30 MINUTES BEFORE BREAKFAST & DINNER., Disp: 180 capsule, Rfl: 1   ondansetron (ZOFRAN) 4 MG tablet, Take 1 tablet (4 mg total) by mouth every 8 (eight) hours as needed for nausea or vomiting., Disp: 20 tablet, Rfl: 0   Soft Lens Products (REWETTING DROPS) SOLN, Place 1 drop into both eyes daily as needed (Dry eyes)., Disp: , Rfl:    sulfamethoxazole-trimethoprim (BACTRIM,SEPTRA) 400-80 MG tablet, Take 1  tablet by mouth at bedtime. For urethritis., Disp: , Rfl:    tamoxifen (NOLVADEX) 10 MG tablet, Take 1/2 tablet by mouth in the evening, Disp: 90 tablet, Rfl: 1   traZODone (DESYREL) 50 MG tablet, TAKE 1 TABLET BY MOUTH EVERYDAY AT BEDTIME, Disp: 90 tablet, Rfl: 1   venlafaxine XR (EFFEXOR-XR) 150 MG 24 hr capsule, TAKE 1 CAPSULE BY MOUTH EVERY DAY, Disp: 90 capsule, Rfl: 0   vitamin E 180 MG (400 UNITS) capsule, Take 400 Units by mouth daily., Disp: , Rfl:   Allergies  Allergen Reactions   Nsaids Other (See Comments) and Anaphylaxis    SEVERE STOMACH CRAMPS, MOUTH SORES  **Able to tolerate Tylenol   Otezla [Apremilast] Anaphylaxis    Suicidal ideation   Penicillins Anaphylaxis and Other (See Comments)    Has patient had a PCN reaction causing immediate rash, facial/tongue/throat swelling, SOB or lightheadedness with hypotension:  Yes  Has patient had a PCN reaction causing severe rash involving mucus membranes or skin necrosis: Yes  Has patient had a PCN reaction that required hospitalization: No  Has patient had a PCN reaction occurring within the last 10 year No  If all of the above answers are "NO", then may proceed with Cephalosporin use.   Erythromycin Other (See Comments)    SEVERE STOMACH CRAMPS   Infliximab Other (See Comments)    Shut down immune system-BP high  Other Reaction(s): Not available   Morphine And Codeine Nausea And Vomiting   Penicillamine Other (See Comments)   Risankizumab Other (See Comments)    URI, fever, cough, UTI Skyrizi   Tolmetin     SEVERE STOMACH CRAMPS   Nickel Rash    Including snaps on hospital gowns    ROS neg/noncontributory except as noted HPI/below      Objective:     BP (!) 144/66   Pulse 64   Temp 98.4 F (36.9  C)   Ht 5\' 6"  (1.676 m)   Wt 234 lb 6.4 oz (106.3 kg)   SpO2 95%   BMI 37.83 kg/m  Wt Readings from Last 3 Encounters:  03/19/23 234 lb 6.4 oz (106.3 kg)  02/05/23 233 lb (105.7 kg)  02/03/23 232 lb (105.2  kg)    Physical Exam   Gen: WDWN NAD HEENT: NCAT, conjunctiva not injected, sclera nonicteric TM WNL B, OP moist, no exudates  NECK:  supple, no thyromegaly, no nodes CARDIAC: RRR, S1S2+, no murmur.  LUNGS: CTAB. No wheezes EXT:  no edema MSK: no gross abnormalities.  NEURO: A&O x3.  CN II-XII intact.  PSYCH: normal mood. Good eye contact Results for orders placed or performed in visit on 03/19/23  POCT Influenza A/B   Collection Time: 03/19/23  3:55 PM  Result Value Ref Range   Influenza A, POC Negative Negative   Influenza B, POC Negative Negative       Assessment & Plan:  Cough, unspecified type -     POCT Influenza A/B  Assessment and Plan    Acute Viral Upper Respiratory Infection Symptoms began on March 15, 2023, with a sore throat that has resolved. A persistent nocturnal cough is causing sleep disturbance. There is no fever, body aches, or worsening of chronic dyspnea. Tests for COVID-19 and influenza are negative, indicating a likely viral etiology. Continue the prednisone dose pack. Prescribe Tussionex (hydrocodone)(PDMP checked) for nighttime cough relief and Tessalon Perles for cough management. Advise against using hydrocodone and Tussionex within six hours of each other. Use hydrocodone if Tussionex is unavailable or too expensive.  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 Further evaluation by pulmonologist Dr. Tereso Newcomer is pending. There is no acute exacerbation with the current illness. The importance of follow-up was discussed. Follow up with Dr. Tereso Newcomer on Tuesday for comprehensive evaluation and management.  Bursitis of the Left Hip Recently diagnosed and managed with a prednisone dose pack, with symptomatic improvement noted. Continue the current prednisone dose pack as prescribed.  General Health Maintenance RSV immunization has been received. No additional immunizations  or screenings are currently recommended. Monitor for updates on the RSV immunization schedule.  Follow-up Follow up with Dr. Tereso Newcomer on Tuesday for COPD evaluation. Monitor symptoms and return if the condition worsens or new symptoms develop.        No follow-ups on file.  Angelena Sole, MD

## 2023-03-22 NOTE — Progress Notes (Signed)
 Cardiology Office Note:    Date:  03/23/2023  ID:  SHAKITA KEIR, DOB Sep 23, 1945, MRN [id number] PCP: Merna Huxley, NP  [facility] Providers Cardiologist:  Dorn Lesches, MD Electrophysiologist:  OLE ONEIDA HOLTS, MD       Patient Profile:      Paroxysmal atrial fibrillation S/p PVI ablation 09/2022 TTE 12/06/20: EF 73, no RWMA, Gr 1 DD, GLS -25.1, NL RVSF, mild LAE, mild MR, AV sclerosis, RAP 3 TTE 03/04/23: EF 60-65. 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 61 w MI) Hyperlipidemia  Breast CA  Hx of DVT 1996          Tonya Bartlett is a 78 y.o. female who returns for follow up of shortness of breath.  She was last seen 01/20/2023 with symptoms of shortness of breath.  BNP was normal.  2D echocardiogram demonstrated normal ejection fraction.  Myocardial PET was low risk but did demonstrate mild ischemia in the mid anterior wall.  I reviewed this with Dr. Lesches.  It was felt that her study was so low risk that she did not require cardiac catheterization.  Coronary CTA could be considered if she continued to have symptoms.  Discussed the use of AI scribe software for clinical note transcription with the patient, who gave verbal consent to proceed.  History of Present Illness   She has been experiencing shortness of breath for the past three to four months, particularly with exertion such as climbing stairs and walking for extended periods. There has been no worsening of these symptoms over time. She also experiences wheezing, especially at night when in  bed, and has had a cough for the past three to four days, which she attributes to a recent illness. She had a recent episode of chest discomfort, which she associates with her hiatal hernia, after eating certain foods. She describes the discomfort as a tight feeling but denies any chest pain associated with exertion, such as climbing stairs. No pain radiating to the jaw, arms, or back, and no episodes of syncope. She reports a recent weight gain, stating that she has never weighed as much as she does now. She attributes some of her symptoms to this weight gain. She has a history of smoking for twenty years, having quit thirty-seven years ago.     Review of Systems  Gastrointestinal:  Negative for hematochezia and melena.  Genitourinary:  Negative for hematuria.  -See HPI    Studies Reviewed:            Risk Assessment/Calculations:    CHA2DS2-VASc Score = 5   This indicates a 7.2% annual risk of stroke. The patient's score is based upon: CHF History: 0 HTN History: 1 Diabetes History: 0 Stroke History: 0 Vascular Disease History: 1 Age Score: 2 Gender Score: 1            Physical Exam:   VS:  BP 136/80   Pulse 68   Ht 5' 6 (1.676 m)   Wt 237 lb (107.5 kg)   SpO2 97%   BMI 38.25 kg/m    Wt Readings  from Last 3 Encounters:  03/23/23 237 lb (107.5 kg)  03/19/23 234 lb 6.4 oz (106.3 kg)  02/05/23 233 lb (105.7 kg)    Constitutional:      Appearance: Healthy appearance. Not in distress.  Neck:     Vascular: JVD normal.  Pulmonary:     Breath sounds: Normal breath sounds. No wheezing. No rales.  Cardiovascular:     Normal rate. Regular rhythm.     Murmurs: There is no murmur.  Edema:    Peripheral edema absent.  Abdominal:     Palpations: Abdomen is soft.      Assessment and Plan:   Assessment & Plan SOB (shortness of breath) She continues to note shortness of breath with certain types of exertion. Her BNP was normal and her echocardiogram showed normal EF, no  valvular heart disease. She had a low risk stress PET scan. I reviewed this with Dr. Court and we thought that CCTA would be reasonable if she had concerning symptoms. She saw Charlies Arthur, PA-C recently who had her do a CT scan to rule out PV complications from her ablation. This was neg but her CT did show emphysematous changes. She is an ex smoker and smoked for about 20 years. She has not had exertional chest pain. I suspect her shortness of breath is more related to the pulmonary findings on her CT than the very small area of ischemia noted on her PET. I think it would be reasonable to hold off on CCTA at this time and refer her to Pulmonology given the CT findings. She agrees with this. -Refer to Pulmonology Coronary artery calcification CAC score 16.6 (31st percentile) on pre ablation CT.  She mainly notes dyspnea on exertion. She had some recent chest pain that was likely due to indigestion as it occurred after eating. But she has not had exertional chest pain. She had a low risk stress PET. This did show a small area of mild anterior ischemia. Her shortness of breath is likely related to ?underlying COPD/emphysema +/- recent weight gain. Refer to pulmonology as noted. If workup unremarkable or if she starts having exertional chest pain, would get CCTA at that time. -Continue Atorvastatin  40 mg once daily  -No ASA as she is on anticoagulation  -Follow up 6 mos. PAF (paroxysmal atrial fibrillation) (HCC) Maintaining NSR on exam. S/p PVI ablation in 09/2022. Continue Eliquis  5 mg twice daily. Follow up with EP as planned.  Primary hypertension Fair control. Continue to monitor. Continue Losartan  100 mg once daily, metoprolol  tartrate 12.5 mg twice daily.      Dispo:  Return in about 6 months (around 09/20/2023) for Routine Follow Up w/ Dr. Court.  Signed, Glendia Ferrier, PA-C

## 2023-03-23 ENCOUNTER — Ambulatory Visit: Payer: Medicare Other | Attending: Physician Assistant | Admitting: Physician Assistant

## 2023-03-23 ENCOUNTER — Encounter: Payer: Self-pay | Admitting: Physician Assistant

## 2023-03-23 VITALS — BP 136/80 | HR 68 | Ht 66.0 in | Wt 237.0 lb

## 2023-03-23 DIAGNOSIS — I2510 Atherosclerotic heart disease of native coronary artery without angina pectoris: Secondary | ICD-10-CM | POA: Diagnosis not present

## 2023-03-23 DIAGNOSIS — R0602 Shortness of breath: Secondary | ICD-10-CM | POA: Insufficient documentation

## 2023-03-23 DIAGNOSIS — I480 Paroxysmal atrial fibrillation: Secondary | ICD-10-CM | POA: Diagnosis not present

## 2023-03-23 DIAGNOSIS — I10 Essential (primary) hypertension: Secondary | ICD-10-CM | POA: Diagnosis not present

## 2023-03-23 NOTE — Assessment & Plan Note (Signed)
Maintaining NSR on exam. S/p PVI ablation in 09/2022. Continue Eliquis 5 mg twice daily. Follow up with EP as planned.

## 2023-03-23 NOTE — Assessment & Plan Note (Addendum)
 CAC score 16.6 (31st percentile) on pre ablation CT.  She mainly notes dyspnea on exertion. She had some recent chest pain that was likely due to indigestion as it occurred after eating. But she has not had exertional chest pain. She had a low risk stress PET. This did show a small area of mild anterior ischemia. Her shortness of breath is likely related to ?underlying COPD/emphysema +/- recent weight gain. Refer to pulmonology as noted. If workup unremarkable or if she starts having exertional chest pain, would get CCTA at that time. -Continue Atorvastatin  40 mg once daily  -No ASA as she is on anticoagulation  -Follow up 6 mos.

## 2023-03-23 NOTE — Patient Instructions (Signed)
Medication Instructions:  Your physician recommends that you continue on your current medications as directed. Please refer to the Current Medication list given to you today.  *If you need a refill on your cardiac medications before your next appointment, please call your pharmacy*   Lab Work: None ordered  If you have labs (blood work) drawn today and your tests are completely normal, you will receive your results only by: MyChart Message (if you have MyChart) OR A paper copy in the mail If you have any lab test that is abnormal or we need to change your treatment, we will call you to review the results.   Testing/Procedures: None ordered  You have been referred to [facility]   Follow-Up: At [facility], you and your health needs are our priority.  As part of our continuing mission to provide you with exceptional heart care, we have created designated Provider Care Teams.  These Care Teams include your primary Cardiologist (physician) and Advanced Practice Providers (APPs -  Physician Assistants and Nurse Practitioners) who all work together to provide you with the care you need, when you need it.  We recommend signing up for the patient portal called "MyChart".  Sign up information is provided on this After Visit Summary.  MyChart is used to connect with patients for Virtual Visits (Telemedicine).  Patients are able to view lab/test results, encounter notes, upcoming appointments, etc.  Non-urgent messages can be sent to your provider as well.   To learn more about what you can do with MyChart, go to [email or web address].    Your next appointment:   6 month(s)  Provider:   Nanetta Batty, MD     Other Instructions   1st Floor: - Lobby - Registration  - Pharmacy  - Lab - Cafe  2nd Floor: - PV Lab - Diagnostic Testing (echo, CT, nuclear med)  3rd Floor: - Vacant  4th Floor: - TCTS (cardiothoracic surgery) - AFib Clinic - Structural Heart  Clinic - Vascular Surgery  - Vascular Ultrasound  5th Floor: - [facility] Cardiology (general and EP) - Clinical Pharmacy for coumadin, hypertension, lipid, weight-loss medications, and med management appointments    Valet parking services will be available as well.

## 2023-03-24 DIAGNOSIS — C44319 Basal cell carcinoma of skin of other parts of face: Secondary | ICD-10-CM | POA: Diagnosis not present

## 2023-03-26 ENCOUNTER — Encounter: Payer: Self-pay | Admitting: Family

## 2023-03-26 ENCOUNTER — Ambulatory Visit (INDEPENDENT_AMBULATORY_CARE_PROVIDER_SITE_OTHER): Payer: Medicare Other | Admitting: Family

## 2023-03-26 ENCOUNTER — Ambulatory Visit: Payer: Self-pay | Admitting: Adult Health

## 2023-03-26 VITALS — BP 157/81 | HR 81 | Temp 97.3°F | Ht 66.0 in | Wt 234.0 lb

## 2023-03-26 DIAGNOSIS — J208 Acute bronchitis due to other specified organisms: Secondary | ICD-10-CM

## 2023-03-26 DIAGNOSIS — B349 Viral infection, unspecified: Secondary | ICD-10-CM

## 2023-03-26 DIAGNOSIS — J9801 Acute bronchospasm: Secondary | ICD-10-CM

## 2023-03-26 MED ORDER — ALBUTEROL SULFATE HFA 108 (90 BASE) MCG/ACT IN AERS
2.0000 | INHALATION_SPRAY | Freq: Four times a day (QID) | RESPIRATORY_TRACT | 0 refills | Status: DC | PRN
Start: 2023-03-26 — End: 2023-04-20

## 2023-03-26 MED ORDER — HYDROCODONE BIT-HOMATROP MBR 5-1.5 MG/5ML PO SOLN: 5.0000 mL | Freq: Three times a day (TID) | ORAL | 0 refills | Status: DC | PRN

## 2023-03-26 NOTE — Progress Notes (Signed)
 Patient ID: Tonya Bartlett, female    DOB: 18-Nov-1945, 78 y.o.   MRN: [id number]  Chief Complaint  Patient presents with   Cough    Pt c/o cough and slight fever of 100.1. Present for 3 weeks and worsening. Has tried tessalon  and Z pack which did help but sx started again yesterday.        HPI: Persistent cough: pt complaints of a cough that has been present for the last 3 days. She was seen on 1/31 for similar sx and given a zpack, tessalon  & cough syrup, though she reports she never got this from the pharmacy.  Patient states that her cough is severe and last night could not sleep because she was coughing every 10 minutes. Cough is dry and non respondent to Mucinex . Patient states that she was tested for COVID and the flu but all were negative 2 weeks ago. Patient denies fever previously but early this am it was 100.41F, having SOB, wheezing, n/v, chest pain aside from chest soreness from coughing.   Assessment & Plan:   2. Acute bronchospasm due to viral infection - lungs ok on exam, pt reports wheezing & tightness at home and continuous non-productive cough, waiting on pulmonary referral appt (pt given phone # to call & sch) Sending albuterol  inhaler, advised on use & SE, resending cough syrup pt never received from previous visit. Advised on increased water  intake, nasal saline spray tid prn and generic Flonase/Nasacort , OTC, to help sinus drainage, postnasal drip. Call office next week if sx still not resolving.   - albuterol  (VENTOLIN  HFA) 108 (90 Base) MCG/ACT inhaler; Inhale 2 puffs into the lungs every 6 (six) hours as needed for wheezing or shortness of breath (Cough;). Start with using in am, around noon and 1 hour before bedtime to help cough symptoms.  Dispense: 8 g; Refill: 0 - HYDROcodone  bit-homatropine (HYCODAN) 5-1.5 MG/5ML syrup; Take 5 mLs by mouth every 8 (eight) hours as needed for cough (May cause drowsiness.).  Dispense: 120 mL; Refill: 0   Subjective:    Outpatient  Medications Prior to Visit  Medication Sig Dispense Refill   acetaminophen  (TYLENOL ) 325 MG tablet Take 650 mg by mouth every 6 (six) hours as needed for moderate pain.     apixaban  (ELIQUIS ) 5 MG TABS tablet Take 1 tablet (5 mg total) by mouth 2 (two) times daily. 84 tablet    Ascorbic Acid (VITAMIN C) 1000 MG tablet Take 1,000 mg by mouth daily.     atorvastatin  (LIPITOR) 40 MG tablet TAKE 1 TABLET BY MOUTH EVERY DAY 90 tablet 3   betamethasone  dipropionate 0.05 % lotion APPLY TOPICALLY TWICE A DAY 60 mL 0   betamethasone , augmented, (DIPROLENE ) 0.05 % lotion Apply topically 2 (two) times daily. 60 mL 0   buPROPion  ER (WELLBUTRIN  SR) 100 MG 12 hr tablet Take 1 tablet (100 mg total) by mouth daily. 30 tablet 0   HYDROcodone -acetaminophen  (NORCO/VICODIN) 5-325 MG tablet Take 2 tablets by mouth every 4 (four) hours as needed for moderate pain or severe pain.     hydrocortisone  cream 1 % Apply 1 application topically daily as needed for itching.     losartan  (COZAAR ) 100 MG tablet Take 1 tablet (100 mg total) by mouth daily. 90 tablet 3   metoprolol  tartrate (LOPRESSOR ) 25 MG tablet TAKE 0.5 TABLETS BY MOUTH 2 TIMES DAILY. 90 tablet 1   omeprazole  (PRILOSEC) 40 MG capsule TAKE 1 CAPSULE BY MOUTH 2 (TWO) TIMES DAILY. TAKE 30  MINUTES BEFORE BREAKFAST & DINNER. 180 capsule 1   ondansetron  (ZOFRAN ) 4 MG tablet Take 1 tablet (4 mg total) by mouth every 8 (eight) hours as needed for nausea or vomiting. 20 tablet 0   Soft Lens Products (REWETTING DROPS) SOLN Place 1 drop into both eyes daily as needed (Dry eyes).     sulfamethoxazole -trimethoprim  (BACTRIM ,SEPTRA ) 400-80 MG tablet Take 1 tablet by mouth at bedtime. For urethritis.     tamoxifen  (NOLVADEX ) 10 MG tablet Take 1/2 tablet by mouth in the evening 90 tablet 1   traZODone  (DESYREL ) 50 MG tablet TAKE 1 TABLET BY MOUTH EVERYDAY AT BEDTIME 90 tablet 1   venlafaxine  XR (EFFEXOR -XR) 150 MG 24 hr capsule TAKE 1 CAPSULE BY MOUTH EVERY DAY 90 capsule 0    vitamin E 180 MG (400 UNITS) capsule Take 400 Units by mouth daily.     azithromycin  (ZITHROMAX ) 250 MG tablet Take 2 tablets ( total 500 mg) PO on day 1, then take 1 tablet ( total 250 mg) by mouth q24h x 4 days. (Patient not taking: Reported on 03/26/2023) 6 tablet 0   No facility-administered medications prior to visit.   Past Medical History:  Diagnosis Date   Alcohol abuse    Sober 41 years.   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  1996   History of hiatal hernia    History of iron deficiency anemia 1996   iron infusion   History of migraine headaches    History of radiation therapy    left breast 03/27/2021-04/23/2021 Dr Lynwood Nasuti   History of rib fracture    Hyperlipidemia    Hypertension    IBS (irritable bowel syndrome)    Insomnia    Joint pain    MCI (mild cognitive impairment)    Melanoma (HCC) 2019   left leg    Migraine headache    hx of migraines    OA (osteoarthritis)    RIGHT SHOULDER   Osteopenia    Personal history of radiation therapy    Pneumonia    remote history   PONV (postoperative nausea and vomiting)    Recovering alcoholic (HCC)    SINCE 01-21-1980   Right rotator cuff tear    SOB (shortness of breath) on exertion    Substance abuse (HCC)    recovering alcoholic   Swallowing difficulty    Unspecified essential hypertension    Past Surgical History:  Procedure Laterality Date   ATRIAL FIBRILLATION ABLATION N/A  10/13/2022   Procedure: ATRIAL FIBRILLATION ABLATION;  Surgeon: Cindie Ole DASEN, MD;  Location: [facility];  Service: Cardiovascular;  Laterality: N/A;   BREAST BIOPSY Left 01/06/2021   pos   BREAST LUMPECTOMY     BREAST LUMPECTOMY WITH RADIOACTIVE SEED  LOCALIZATION Left 02/27/2021   Procedure: LEFT BREAST LUMPECTOMY WITH RADIOACTIVE SEED LOCALIZATION;  Surgeon: Aron Shoulders, MD;  Location: [facility];  Service: General;  Laterality: Left;   BUNIONECTOMY/  HAMMERTOE CORRECTION  RIGHT FOOT  2011   CATARACT EXTRACTION W/ INTRAOCULAR LENS  IMPLANT, BILATERAL     COLONOSCOPY     DILATION AND CURETTAGE OF UTERUS  1976   EYE SURGERY Bilateral 2016   cataract removal   KNEE ARTHROSCOPY W/ MENISCECTOMY Bilateral X2  LEFT /    X1  RIGHT   KNEE OPEN LATERAL RELEASE Bilateral    MOHS SURGERY Left 12/15/2017   Melanoma in situ - left calf - [facility]   ORIF ANKLE FRACTURE Right 08/04/2020   Procedure: OPEN REDUCTION INTERNAL FIXATION (ORIF) ANKLE FRACTURE;  Surgeon: Kit Rush, MD;  Location: [facility];  Service: Orthopedics;  Laterality: Right;  Mini C-arm, [employer] Small Frag   REPLACEMENT TOTAL KNEE Left 2006   SHOULDER ARTHROSCOPY WITH SUBACROMIAL DECOMPRESSION, ROTATOR CUFF REPAIR AND BICEP TENDON REPAIR Right 05/23/2013   Procedure: RIGHT SHOULDER ARTHROSCOPY EXAM UNDER ANESTHESIA  WITH SUBACROMIAL DECOMPRESSION,DISTAL CLAVICLE RESECTION, SADLABRAL DEBRIDEMENT CHONDROPLASTY, BICEP TENOTOMY ;  Surgeon: Lamar Collet, MD;  Location: [facility];  Service: Orthopedics;  Laterality: Right;   TONSILLECTOMY AND ADENOIDECTOMY  AGE 89   TOTAL HIP ARTHROPLASTY Left 05/04/2017   Procedure: LEFT TOTAL HIP ARTHROPLASTY ANTERIOR APPROACH;  Surgeon: Ernie Cough, MD;  Location: [facility];  Service: Orthopedics;  Laterality: Left;   TOTAL HIP ARTHROPLASTY Right 08/01/2019   Procedure: TOTAL HIP ARTHROPLASTY ANTERIOR APPROACH;  Surgeon: Ernie Cough, MD;  Location: [facility];   Service: Orthopedics;  Laterality: Right;    TOTAL KNEE ARTHROPLASTY Bilateral LEFT  1996/   RIGHT 2004   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      UPPER GASTROINTESTINAL ENDOSCOPY     UPPER GI ENDOSCOPY     VAGINAL HYSTERECTOMY  1976   Allergies  Allergen Reactions   Nsaids Other (See Comments) and Anaphylaxis    SEVERE STOMACH CRAMPS, MOUTH SORES  **Able to tolerate Tylenol    Otezla [Apremilast] Anaphylaxis    Suicidal ideation   Penicillins Anaphylaxis and Other (See Comments)    Has patient had a PCN reaction causing immediate rash, facial/tongue/throat swelling, SOB or lightheadedness with hypotension:  Yes  Has patient had a PCN reaction causing severe rash involving mucus membranes or skin necrosis: Yes  Has patient had a PCN reaction that required hospitalization: No  Has patient had a PCN reaction occurring within the last 10 year No  If all of the above answers are NO, then may proceed with Cephalosporin use.   Erythromycin Other (See Comments)    SEVERE STOMACH CRAMPS   Infliximab  Other (See Comments)    Shut down immune system-BP high  Other Reaction(s): Not available   Morphine And Codeine Nausea And Vomiting   Penicillamine Other (See Comments)   Risankizumab Other (See Comments)    URI, fever, cough, UTI Skyrizi   Tolmetin     SEVERE STOMACH CRAMPS   Nickel Rash    Including snaps on hospital gowns       Objective:    Physical Exam Vitals and nursing note reviewed.  Constitutional:      Appearance: Normal appearance. She is ill-appearing.     Interventions: Face mask in place.  HENT:     Right Ear: Tympanic membrane and ear canal normal.     Left Ear: Tympanic membrane and ear  canal normal.     Nose:     Right Sinus: No frontal sinus tenderness.     Left Sinus: No frontal sinus tenderness.     Mouth/Throat:     Mouth: Mucous membranes are moist.     Pharynx: No pharyngeal swelling, oropharyngeal exudate, posterior oropharyngeal erythema or  uvula swelling.     Tonsils: No tonsillar exudate or tonsillar abscesses.  Cardiovascular:     Rate and Rhythm: Normal rate and regular rhythm.  Pulmonary:     Effort: Pulmonary effort is normal.     Breath sounds: Normal breath sounds.  Musculoskeletal:        General: Normal range of motion.  Lymphadenopathy:     Head:     Right side of head: No preauricular or posterior auricular adenopathy.     Left side of head: No preauricular or posterior auricular adenopathy.     Cervical: No cervical adenopathy.  Skin:    General: Skin is warm and dry.  Neurological:     Mental Status: She is alert.  Psychiatric:        Mood and Affect: Mood normal.        Behavior: Behavior normal.    BP (!) 157/81 (BP Location: Left Arm, Patient Position: Sitting, Cuff Size: Normal)   Pulse 81   Temp (!) 97.3 F (36.3 C) (Temporal)   Ht 5' 6 (1.676 m)   Wt 234 lb (106.1 kg)   SpO2 97%   BMI 37.77 kg/m  Wt Readings from Last 3 Encounters:  03/26/23 234 lb (106.1 kg)  03/23/23 237 lb (107.5 kg)  03/19/23 234 lb 6.4 oz (106.3 kg)      Lucius Krabbe, NP

## 2023-03-26 NOTE — Telephone Encounter (Signed)
  Chief Complaint: Cough Symptoms: Cough, fever Frequency: Constant Pertinent Negatives: Patient denies Fever, SOB, wheezing, vomiting Disposition: [] ED /[] Urgent Care (no appt availability in office) / [x] Appointment(In office/virtual)/ []  [facility]/ [] Home Care/ [] Refused Recommended Disposition /[] [place] [facility]/ []  Follow-up with PCP Additional Notes: Patient called with complaints of a cough that has been present for the last 3 days. Patient states that cough is severe and last night could not sleep because she was coughing every 10 minutes. Cough is dry and non respondent to Mucinex . Patient states that she was tested for COVID and the flu but all were negative. Patient denies fever (however states that her fever earlier was 100.724F but since has returned to normal), SOB, wheezing, n/v, chest pain aside from chest soreness from coughing. Patient advised by this RN to be seen within 24 hours per protocol to which patient was agreeable. Appt scheduled. Patient advised to call back with worsening symptoms. Patient verbalized understanding.   Copied from CRM [id number]. Topic: Clinical - Red Word Triage >> Mar 26, 2023 10:12 AM Robinson H wrote: [facility] that prompted transfer to Nurse Triage: Increased cough patient states she couldn't lay down and sleep last night, some pain. Low grade fever this morning. Reason for Disposition  [1] Continuous (nonstop) coughing interferes with work or school AND [2] no improvement using cough treatment per Care Advice  Answer Assessment - Initial Assessment Questions 1. ONSET: When did the cough begin?      3 days ago 2. SEVERITY: How bad is the cough today?      Severe  3. SPUTUM: Describe the color of your sputum (none, dry cough; clear, white, yellow, green)     Denies 4. HEMOPTYSIS: Are you coughing up any blood? If so ask: How much? (flecks, streaks, tablespoons, etc.)     Denies 5. DIFFICULTY BREATHING: Are you having  difficulty breathing? If Yes, ask: How bad is it? (e.g., mild, moderate, severe)    - MILD: No SOB at rest, mild SOB with walking, speaks normally in sentences, can lie down, no retractions, pulse < 100.    - MODERATE: SOB at rest, SOB with minimal exertion and prefers to sit, cannot lie down flat, speaks in phrases, mild retractions, audible wheezing, pulse 100-120.    - SEVERE: Very SOB at rest, speaks in single words, struggling to breathe, sitting hunched forward, retractions, pulse > 120      Denies 6. FEVER: Do you have a fever? If Yes, ask: What is your temperature, how was it measured, and when did it start?     100.724F This AM, since returned to normal 7. CARDIAC HISTORY: Do you have any history of heart disease? (e.g., heart attack, congestive heart failure)      Denies 8. LUNG HISTORY: Do you have any history of lung disease?  (e.g., pulmonary embolus, asthma, emphysema)     Denies 9. PE RISK FACTORS: Do you have a history of blood clots? (or: recent major surgery, recent prolonged travel, bedridden)     Denies 10. OTHER SYMPTOMS: Do you have any other symptoms? (e.g., runny nose, wheezing, chest pain)       Chest soreness from coughing  12. TRAVEL: Have you traveled out of the country in the last month? (e.g., travel history, exposures)       Denies  Protocols used: Cough - Acute Non-Productive-A-AH

## 2023-04-01 ENCOUNTER — Emergency Department (HOSPITAL_BASED_OUTPATIENT_CLINIC_OR_DEPARTMENT_OTHER)
Admission: EM | Admit: 2023-04-01 | Discharge: 2023-04-01 | Disposition: A | Discharge status: Home / Self Care | Payer: Medicare Other | Attending: Emergency Medicine | Admitting: Emergency Medicine

## 2023-04-01 ENCOUNTER — Ambulatory Visit (HOSPITAL_COMMUNITY): Payer: Medicare Other | Admitting: Licensed Clinical Social Worker

## 2023-04-01 ENCOUNTER — Ambulatory Visit: Payer: Self-pay | Admitting: Adult Health

## 2023-04-01 ENCOUNTER — Emergency Department (HOSPITAL_BASED_OUTPATIENT_CLINIC_OR_DEPARTMENT_OTHER): Payer: Medicare Other | Admitting: Radiology

## 2023-04-01 ENCOUNTER — Encounter (HOSPITAL_BASED_OUTPATIENT_CLINIC_OR_DEPARTMENT_OTHER): Payer: Self-pay | Admitting: Emergency Medicine

## 2023-04-01 ENCOUNTER — Other Ambulatory Visit: Payer: Self-pay

## 2023-04-01 DIAGNOSIS — J101 Influenza due to other identified influenza virus with other respiratory manifestations: Secondary | ICD-10-CM | POA: Diagnosis not present

## 2023-04-01 DIAGNOSIS — I10 Essential (primary) hypertension: Secondary | ICD-10-CM | POA: Insufficient documentation

## 2023-04-01 DIAGNOSIS — R059 Cough, unspecified: Secondary | ICD-10-CM | POA: Diagnosis not present

## 2023-04-01 DIAGNOSIS — Z7901 Long term (current) use of anticoagulants: Secondary | ICD-10-CM | POA: Insufficient documentation

## 2023-04-01 DIAGNOSIS — J111 Influenza due to unidentified influenza virus with other respiratory manifestations: Secondary | ICD-10-CM | POA: Diagnosis not present

## 2023-04-01 DIAGNOSIS — I2510 Atherosclerotic heart disease of native coronary artery without angina pectoris: Secondary | ICD-10-CM | POA: Diagnosis not present

## 2023-04-01 DIAGNOSIS — Z79899 Other long term (current) drug therapy: Secondary | ICD-10-CM | POA: Diagnosis not present

## 2023-04-01 DIAGNOSIS — R0602 Shortness of breath: Secondary | ICD-10-CM | POA: Diagnosis not present

## 2023-04-01 LAB — CBC
HCT: 39.2 % (ref 36.0–46.0)
Hemoglobin: 13.3 g/dL (ref 12.0–15.0)
MCH: 33.7 pg (ref 26.0–34.0)
MCHC: 33.9 g/dL (ref 30.0–36.0)
MCV: 99.2 fL (ref 80.0–100.0)
Platelets: 199 10*3/uL (ref 150–400)
RBC: 3.95 MIL/uL (ref 3.87–5.11)
RDW: 11.8 % (ref 11.5–15.5)
WBC: 4.1 10*3/uL (ref 4.0–10.5)
nRBC: 0 % (ref 0.0–0.2)

## 2023-04-01 LAB — RESP PANEL BY RT-PCR (RSV, FLU A&B, COVID)  RVPGX2
Influenza A by PCR: POSITIVE — AB
Influenza B by PCR: NEGATIVE
Resp Syncytial Virus by PCR: NEGATIVE
SARS Coronavirus 2 by RT PCR: NEGATIVE

## 2023-04-01 LAB — BASIC METABOLIC PANEL
Anion gap: 9 (ref 5–15)
BUN: 7 mg/dL — ABNORMAL LOW (ref 8–23)
CO2: 24 mmol/L (ref 22–32)
Calcium: 9 mg/dL (ref 8.9–10.3)
Chloride: 106 mmol/L (ref 98–111)
Creatinine, Ser: 0.62 mg/dL (ref 0.44–1.00)
GFR, Estimated: >60 mL/min (ref >60)
Glucose, Bld: 106 mg/dL — ABNORMAL HIGH (ref 70–99)
Potassium: 4 mmol/L (ref 3.5–5.1)
Sodium: 139 mmol/L (ref 135–145)

## 2023-04-01 MED ORDER — DM-GUAIFENESIN ER 30-600 MG PO TB12: 1.0000 | ORAL_TABLET | Freq: Two times a day (BID) | ORAL | 0 refills | Status: AC

## 2023-04-01 MED ORDER — ALBUTEROL SULFATE HFA 108 (90 BASE) MCG/ACT IN AERS
2.0000 | INHALATION_SPRAY | RESPIRATORY_TRACT | Status: DC | PRN
Start: 2023-04-01 — End: 2023-04-01

## 2023-04-01 NOTE — ED Notes (Signed)
Pt discharged home after verbalizing understanding of discharge instructions; nad noted.

## 2023-04-01 NOTE — Telephone Encounter (Signed)
Chief Complaint: SOB Symptoms: SOB, cough, wheezing Frequency: 3 wks Pertinent Negatives: Patient denies fever, CP NOT with coughing Disposition: [x] ED /[] Urgent Care (no appt availability in office) / [] Appointment(In office/virtual)/ []  [facility]/ [] Home Care/ [] Refused Recommended Disposition /[] [facility]/ []  Follow-up with PCP Additional Notes: Pt reports cough, SOB, and CP with coughing. Pt sick for 3 wks. Diagnosed with bronchitis d/t viral infection on 2/7. CP ONLY with coughing. Taking albuterol and cough syrup prescribed. Somewhat effective. Pt still not getting better. Productive cough that makes it hard to breathe. Pt swallow mucus and doesn't know what color it is. Nasal congestion as well. Mild SOB at rest, worse on exertion. Endorses SOB for 6 wks, states that started before the viral infection. Has seen cardiology for that and is waiting to see Pulm in April. States sometimes her HR goes above 100. HR 68 on phone with RN. RN advised pt go to the ED. Pt agreeable and states her daughter can bring her and they will go to [facility]. RN advised pt to call 911 if she gets worse before then, she verbalized understanding.   Copied from CRM [id number]. Topic: Clinical - Red Word Triage >> Apr 01, 2023  9:02 AM Fuller Mandril wrote: Red Word that prompted transfer to Nurse Triage: Coughing hard to breath, chest pain. Recently seen but symptoms are worsening. Reason for Disposition  [1] MODERATE difficulty breathing (e.g., speaks in phrases, SOB even at rest, pulse 100-120) AND [2] NEW-onset or WORSE than normal  Answer Assessment - Initial Assessment Questions 1. RESPIRATORY STATUS: "Describe your breathing?" (e.g., wheezing, shortness of breath, unable to speak, severe coughing)      "Every time I take a deep breath, I cough", "I am stretching this thing out for 3 weeks." "I am as sick this morning as I was two weeks ago", "I can hear myself wheezing when I get in and out  of bed", "any kind of exertion and I wheeze" 2. ONSET: "When did this breathing problem begin?"      3 wks ago 3. PATTERN "Does the difficult breathing come and go, or has it been constant since it started?"      Comes and goes 4. SEVERITY: "How bad is your breathing?" (e.g., mild, moderate, severe)    - MILD: No SOB at rest, mild SOB with walking, speaks normally in sentences, can lie down, no retractions, pulse < 100.    - MODERATE: SOB at rest, SOB with minimal exertion and prefers to sit, cannot lie down flat, speaks in phrases, mild retractions, audible wheezing, pulse 100-120.    - SEVERE: Very SOB at rest, speaks in single words, struggling to breathe, sitting hunched forward, retractions, pulse > 120      "Sometimes" SOB at breath at rest, my HR "goes over 100 just sitting there" - SOB is moderate  5. RECURRENT SYMPTOM: "Have you had difficulty breathing before?" If Yes, ask: "When was the last time?" and "What happened that time?"      No 6. CARDIAC HISTORY: "Do you have any history of heart disease?" (e.g., heart attack, angina, bypass surgery, angioplasty)      Afib, atherosclerosis 7. LUNG HISTORY: "Do you have any history of lung disease?"  (e.g., pulmonary embolus, asthma, emphysema)     No - "I've been complaining of SOB for 6 wks", SOB started before she got sick with viral infection. Saw Cardiology and was referred to [facility], can't see them until April 8. CAUSE: "What  do you think is causing the breathing problem?"      Not sure - diagnosed with bronchitis d/t viral infection on 2/7 9. OTHER SYMPTOMS: "Do you have any other symptoms? (e.g., dizziness, runny nose, cough, chest pain, fever)     Weakness and needing to sit down after a couple minutes of doing anything, CP ONLY with coughing, productive cough (states she swallows the mucus and can't see the color), feels afebrile (not checked her temp in a couple weeks) - pt checked temp on phone and it is 98.8F. HR 68 right now with  Apple Watch. Had chills and body aches yesterday, none now. "Little bit" of diarrhea yesterday just one time. Drinking some kind of liquid at all times "keep my water next to me all day." Prescribed albuterol and cough syrup on 2/7 which she states were helpful. "I get better then I get worse." Taking Mucinex capsules and that seems to be working for congestion  Protocols used: Breathing Difficulty-A-AH

## 2023-04-01 NOTE — ED Provider Notes (Signed)
 [place] [facility] DEPARTMENT AT [facility] Provider Note   CSN: [id number] Arrival date & time: 04/01/23  1144     History {Add pertinent medical, surgical, social history, OB history to HPI:1} Chief Complaint  Patient presents with   Shortness of Breath    Tonya Bartlett is a 78 y.o. female. 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 Authorizing Provider  acetaminophen (TYLENOL) 325 MG tablet Take 650 mg by mouth every 6 (six) hours as needed for moderate pain.    [provider]  albuterol (VENTOLIN HFA) 108 (90 Base) MCG/ACT inhaler Inhale 2 puffs into the lungs every 6 (six) hours as needed for wheezing or shortness of breath (Cough;). Start with using in am, around noon and 1 hour before bedtime to help cough symptoms. 03/26/23   Dulce Sellar, NP  apixaban (ELIQUIS) 5 MG TABS tablet Take 1 tablet (5 mg total) by mouth 2 (two) times daily. 12/10/22   Fenton, Clint R, PA  Ascorbic Acid (VITAMIN C) 1000 MG tablet Take 1,000 mg by mouth daily.    [provider]  atorvastatin (LIPITOR) 40 MG tablet TAKE 1 TABLET BY MOUTH EVERY DAY 09/16/22   Runell Gess, MD  azithromycin (ZITHROMAX) 250 MG tablet Take 2 tablets ( total 500 mg) PO on day 1, then take 1 tablet ( total 250 mg) by mouth q24h x 4 days. Patient not taking: Reported on 03/26/2023 03/19/23   Jeani Sow, MD  betamethasone dipropionate 0.05 % lotion APPLY TOPICALLY TWICE A DAY 09/16/22   Nafziger, Kandee Keen, NP  betamethasone, augmented, (DIPROLENE) 0.05 % lotion Apply topically 2 (two) times daily. 05/06/22   Nafziger, Kandee Keen, NP  buPROPion ER (WELLBUTRIN SR) 100 MG 12 hr tablet Take 1 tablet (100 mg total) by mouth daily. 03/15/23   Thresa Ross, MD  HYDROcodone bit-homatropine (HYCODAN) 5-1.5 MG/5ML syrup Take 5 mLs by mouth every 8  (eight) hours as needed for cough (May cause drowsiness.). 03/26/23   Dulce Sellar, NP  HYDROcodone-acetaminophen (NORCO/VICODIN) 5-325 MG tablet Take 2 tablets by mouth every 4 (four) hours as needed for moderate pain or severe pain. 03/07/21   [provider]  hydrocortisone cream 1 % Apply 1 application topically daily as needed for itching.    [provider]  losartan (COZAAR) 100 MG tablet Take 1 tablet (100 mg total) by mouth daily. 06/17/22   Nafziger, Kandee Keen, NP  metoprolol tartrate (LOPRESSOR) 25 MG tablet TAKE 0.5 TABLETS BY MOUTH 2 TIMES DAILY. 05/14/22   Little Ishikawa, MD  omeprazole (PRILOSEC) 40 MG capsule TAKE 1 CAPSULE BY MOUTH 2 (TWO) TIMES DAILY. TAKE 30 MINUTES BEFORE BREAKFAST & DINNER. 10/15/22   Arnaldo Natal, NP  ondansetron (ZOFRAN) 4 MG tablet Take 1 tablet (4 mg total) by mouth every 8 (eight) hours as needed for nausea or vomiting. 02/03/23   Nafziger, Kandee Keen, NP  Soft Lens Products (REWETTING DROPS) SOLN Place 1 drop into both eyes daily as needed (Dry eyes).    [provider]  sulfamethoxazole-trimethoprim (BACTRIM,SEPTRA) 400-80 MG tablet Take 1 tablet by mouth at bedtime. For urethritis.    Ihor Gully, MD  tamoxifen (NOLVADEX) 10 MG tablet Take 1/2 tablet by mouth in the evening 01/25/23   Serena Croissant, MD  traZODone (DESYREL) 50 MG tablet TAKE 1 TABLET BY MOUTH EVERYDAY AT  BEDTIME 02/11/23   Nafziger, Kandee Keen, NP  venlafaxine XR (EFFEXOR-XR) 150 MG 24 hr capsule TAKE 1 CAPSULE BY MOUTH EVERY DAY 03/05/23   Thresa Ross, MD  vitamin E 180 MG (400 UNITS) capsule Take 400 Units by mouth daily.    [provider]      Allergies    Nsaids, Otezla [apremilast], Penicillins, Erythromycin, Infliximab, Morphine and codeine, Penicillamine, Risankizumab, Tolmetin, and Nickel    Review of Systems   Review of Systems  Respiratory:  Positive for shortness of breath.     Physical Exam Updated Vital Signs BP (!) 153/79    Pulse 66   Temp 97.9 F (36.6 C) (Oral)   Resp 20   SpO2 98%  Physical Exam  ED Results / Procedures / Treatments   Labs (all labs ordered are listed, but only abnormal results are displayed) Labs Reviewed  RESP PANEL BY RT-PCR (RSV, FLU A&B, COVID)  RVPGX2 - Abnormal; Notable for the following components:      Result Value   Influenza A by PCR POSITIVE (*)    All other components within normal limits  BASIC METABOLIC PANEL - Abnormal; Notable for the following components:   Glucose, Bld 106 (*)    BUN 7 (*)    All other components within normal limits  CBC    EKG None  Radiology DG Chest 2 View Result Date: 04/01/2023 CLINICAL DATA:  Shortness of breath.  Cough. EXAM: CHEST - 2 VIEW COMPARISON:  Chest radiograph dated 01/08/2023. CT chest dated 02/19/2023. FINDINGS: The heart size and mediastinal contours are within normal limits. No focal consolidation, pleural effusion, or pneumothorax. Surgical clips project over the left chest wall. Suture anchors in the right humeral head. No acute osseous abnormality. IMPRESSION: No acute cardiopulmonary findings. Electronically Signed   By: Hart Robinsons M.D.   On: 04/01/2023 15:46    Procedures Procedures  {Document cardiac monitor, telemetry assessment procedure when appropriate:1}  Medications Ordered in ED Medications  albuterol (VENTOLIN HFA) 108 (90 Base) MCG/ACT inhaler 2 puff (has no administration in time range)    ED Course/ Medical Decision Making/ A&P   {   Click here for ABCD2, HEART and other calculatorsREFRESH Note before signing :1}                              Medical Decision Making  ***  {Document critical care time when appropriate:1} {Document review of labs and clinical decision tools ie heart score, Chads2Vasc2 etc:1}  {Document your independent review of radiology images, and any outside records:1} {Document your discussion with family members, caretakers, and with consultants:1} {Document social  determinants of health affecting pt's care:1} {Document your decision making why or why not admission, treatments were needed:1} Final Clinical Impression(s) / ED Diagnoses Final diagnoses:  None    Rx / DC Orders ED Discharge Orders     None

## 2023-04-01 NOTE — Discharge Instructions (Signed)
Thank you for letting us evaluate you today. You tested positive for influenza. Please make sure to drink plenty of fluids (chicken broth, water, gatorade, pedialyte)

## 2023-04-01 NOTE — ED Triage Notes (Signed)
Pt has pulmonary appointment in April for new patient.

## 2023-04-01 NOTE — ED Triage Notes (Signed)
3 weeks -cough,congested ,short of breath, no appetite.

## 2023-04-05 ENCOUNTER — Telehealth (HOSPITAL_COMMUNITY): Payer: Self-pay | Admitting: *Deleted

## 2023-04-05 ENCOUNTER — Other Ambulatory Visit (HOSPITAL_COMMUNITY): Payer: Self-pay | Admitting: *Deleted

## 2023-04-05 ENCOUNTER — Telehealth: Payer: Self-pay

## 2023-04-05 MED ORDER — VENLAFAXINE HCL ER 150 MG PO CP24: 150.0000 mg | ORAL_CAPSULE | Freq: Every day | ORAL | 0 refills | Status: DC

## 2023-04-05 NOTE — Transitions of Care (Post Inpatient/ED Visit) (Signed)
 04/05/2023  Name: Tonya Bartlett MRN: [id number] DOB: 1946/02/08  Today's TOC FU Call Status: Today's TOC FU Call Status:: Successful TOC FU Call Completed TOC FU Call Complete Date: 04/05/23 Patient's Name and Date of Birth confirmed.  Transition Care Management Follow-up Telephone Call Date of Discharge: 04/01/23 Discharge Facility: [facility] ([facility]) Type of Discharge: Emergency Department Reason for ED Visit: Other: (Influenza   Shortness of Breath) How have you been since you were released from the hospital?: Better Any questions or concerns?: No  Items Reviewed: Did you receive and understand the discharge instructions provided?: Yes Medications obtained,verified, and reconciled?: Yes (Medications Reviewed) Any new allergies since your discharge?: No Dietary orders reviewed?: NA Do you have support at home?: Yes  Medications Reviewed Today: Medications Reviewed Today     Reviewed by Leigh Aurora, CMA (Certified Medical Assistant) on 04/05/23 at 1517  Med List Status: <None>   Medication Order Taking? Sig Documenting Provider Last Dose Status Informant  acetaminophen (TYLENOL) 325 MG tablet [id number] No Take 650 mg by mouth every 6 (six) hours as needed for moderate pain. [provider] Taking Active Self  albuterol (VENTOLIN HFA) 108 (90 Base) MCG/ACT inhaler [id number]  Inhale 2 puffs into the lungs every 6 (six) hours as needed for wheezing or shortness of breath (Cough;). Start with using in am, around noon and 1 hour before bedtime to help cough symptoms. Dulce Sellar, NP  Active   apixaban (ELIQUIS) 5 MG TABS tablet [id number] No Take 1 tablet (5 mg total) by mouth 2 (two) times daily. Fenton, [place] R, PA Taking Active   Ascorbic Acid (VITAMIN C) 1000 MG tablet [id number] No Take 1,000 mg by mouth daily. [provider] Taking Active Self  atorvastatin (LIPITOR) 40 MG tablet [id number] No TAKE 1 TABLET BY MOUTH EVERY DAY Runell Gess, MD Taking Active Self  azithromycin (ZITHROMAX) 250 MG tablet [id number] No Take 2 tablets ( total 500 mg) PO on day 1, then take 1 tablet ( total 250 mg) by mouth q24h x 4 days.  Patient not taking: Reported on 03/26/2023   Jeani Sow, MD Not Taking Active   betamethasone dipropionate 0.05 % lotion [id number] No APPLY TOPICALLY TWICE A DAY Nafziger, Kandee Keen, NP Taking Active Self  betamethasone, augmented, (DIPROLENE) 0.05 % lotion [id number] No Apply topically 2 (two) times daily. Nafziger, Kandee Keen, NP Taking Active Self  buPROPion ER [facility] SR) 100 MG 12 hr tablet [id number] No Take 1 tablet (100 mg total) by mouth daily. Thresa Ross, MD Taking Active   dextromethorphan-guaiFENesin [facility] DM) 30-600 MG 12hr tablet [id number]  Take 1 tablet by mouth 2 (two) times daily for 7 days. Judithann Sheen, PA  Active   HYDROcodone bit-homatropine (HYCODAN) 5-1.5 MG/5ML syrup [id number]  Take 5 mLs by mouth every 8 (eight) hours as needed for cough (May cause drowsiness.). Dulce Sellar, NP  Active   HYDROcodone-acetaminophen (NORCO/VICODIN) 5-325 MG tablet [id number] No Take 2 tablets by mouth every 4 (four) hours as needed for moderate pain or severe pain. [provider] Taking Active Self  hydrocortisone cream 1 % [id number] No Apply 1 application topically daily as needed for itching. [provider] Taking Active Self  losartan (COZAAR) 100 MG tablet [id number] No Take 1 tablet (100 mg total) by mouth daily. Nafziger, Kandee Keen, NP Taking Active Self  metoprolol tartrate (LOPRESSOR) 25 MG tablet [id number] No TAKE 0.5 TABLETS BY MOUTH 2 TIMES DAILY. Little Ishikawa, MD Taking Active Self  omeprazole (PRILOSEC)  40 MG capsule [id number] No TAKE 1 CAPSULE BY MOUTH 2 (TWO) TIMES DAILY. TAKE 30 MINUTES BEFORE BREAKFAST & DINNER. Arnaldo Natal, NP Taking Active   ondansetron (ZOFRAN) 4 MG tablet [id number] No Take 1 tablet (4 mg total) by mouth every 8 (eight) hours  as needed for nausea or vomiting. Nafziger, Kandee Keen, NP Taking Active   Soft Lens Products (REWETTING DROPS) SOLN [id number] No Place 1 drop into both eyes daily as needed (Dry eyes). [provider] Taking Active Self  sulfamethoxazole-trimethoprim (BACTRIM,SEPTRA) 400-80 MG tablet [id number] No Take 1 tablet by mouth at bedtime. For urethritis. Ihor Gully, MD Taking Active Self  tamoxifen (NOLVADEX) 10 MG tablet [id number] No Take 1/2 tablet by mouth in the evening Serena Croissant, MD Taking Active   traZODone (DESYREL) 50 MG tablet [id number] No TAKE 1 TABLET BY MOUTH EVERYDAY AT BEDTIME Nafziger, Kandee Keen, NP Taking Active   venlafaxine XR (EFFEXOR-XR) 150 MG 24 hr capsule [id number]  Take 1 capsule (150 mg total) by mouth daily. Thresa Ross, MD  Active   vitamin E 180 MG (400 UNITS) capsule [id number] No Take 400 Units by mouth daily. [provider] Taking Active Self            Home Care and Equipment/Supplies: Were Home Health Services Ordered?: NA Any new equipment or medical supplies ordered?: NA  Functional Questionnaire: Do you need assistance with bathing/showering or dressing?: No Do you need assistance with meal preparation?: No Do you need assistance with eating?: No Do you have difficulty maintaining continence: No Do you need assistance with getting out of bed/getting out of a chair/moving?: No Do you have difficulty managing or taking your medications?: No  Follow up appointments reviewed: PCP Follow-up appointment confirmed?: NA Specialist Hospital Follow-up appointment confirmed?: NA Do you need transportation to your follow-up appointment?: No Do you understand care options if your condition(s) worsen?: Yes-patient verbalized understanding    SIGNATURE Agnes Lawrence, CMA (AAMA)  [facility] [phone]

## 2023-04-05 NOTE — Telephone Encounter (Signed)
 Rx Refill Request [id number] [place], [place] - [address]   venlafaxine XR (EFFEXOR-XR) 150 MG 24 hr capsule   Next Appt  05/10/23 Last Appt   03/15/23

## 2023-04-05 NOTE — Telephone Encounter (Signed)
 Refill Sent Co Sign venlafaxine XR (EFFEXOR-XR) 150 MG 24 hr capsule

## 2023-04-08 ENCOUNTER — Ambulatory Visit (INDEPENDENT_AMBULATORY_CARE_PROVIDER_SITE_OTHER): Payer: Medicare Other | Admitting: Adult Health

## 2023-04-08 ENCOUNTER — Encounter: Payer: Self-pay | Admitting: Adult Health

## 2023-04-08 ENCOUNTER — Telehealth (HOSPITAL_COMMUNITY): Payer: Self-pay | Admitting: *Deleted

## 2023-04-08 VITALS — BP 120/80 | HR 64 | Temp 97.7°F | Ht 66.0 in | Wt 231.0 lb

## 2023-04-08 DIAGNOSIS — J101 Influenza due to other identified influenza virus with other respiratory manifestations: Secondary | ICD-10-CM | POA: Diagnosis not present

## 2023-04-08 DIAGNOSIS — R051 Acute cough: Secondary | ICD-10-CM

## 2023-04-08 MED ORDER — VENLAFAXINE HCL ER 150 MG PO CP24: 150.0000 mg | ORAL_CAPSULE | Freq: Every day | ORAL | 0 refills | Status: DC

## 2023-04-08 NOTE — Progress Notes (Signed)
 Subjective:    Patient ID: Tonya Bartlett, female    DOB: 1946-02-14, 78 y.o.   MRN: [id number]  HPI 78 year old female who  has a past medical history of Alcohol abuse, Allergic rhinitis, Allergy, Anemia, Anxiety, Blood transfusion without reported diagnosis, Breast cancer (HCC) (12/2020), Cancer (HCC), Cataract, Clotting disorder (HCC), Colitis, Complication of anesthesia, Depression, Diverticulosis, Dysrhythmia, Fatigue, Frequency of urination, GERD (gastroesophageal reflux disease), History of colon polyps, History of DVT of lower extremity, History of hiatal hernia, History of iron deficiency anemia (1996), History of migraine headaches, History of radiation therapy, History of rib fracture, Hyperlipidemia, Hypertension, IBS (irritable bowel syndrome), Insomnia, Joint pain, MCI (mild cognitive impairment), Melanoma (HCC) (2019), Migraine headache, OA (osteoarthritis), Osteopenia, Personal history of radiation therapy, Pneumonia, PONV (postoperative nausea and vomiting), Recovering alcoholic (HCC), Right rotator cuff tear, SOB (shortness of breath) on exertion, Substance abuse (HCC), Swallowing difficulty, and Unspecified essential hypertension.  She presents the office today for follow-up after being seen in the emergency room 7 days ago.  She presented with the evaluation of cough, congestion, mild shortness of breath for 1 week.  She denied chest pain, fever, or vomiting.  In the ER her respiratory panel showed positive influenza A.  EKG showed normal sinus rhythm with no changes from previous EKG.  Chest x-ray negative for cardiopulmonary findings.  She was outside of treatment window for antivirals.  Was prescribed Mucinex DM.  Today she reports that she is feeling much better. She has not had any fevers, chills, SOB and the cough has become less frequent and less harsh.    Review of Systems See HPI   Past Medical History:  Diagnosis Date   Alcohol abuse    Sober 41 years.   Allergic  rhinitis    Allergy    Anemia    Anxiety    Blood transfusion without reported diagnosis    Breast cancer (HCC) 12/2020   left   Cancer (HCC)    Cataract    bil cataracts removed   Clotting disorder (HCC)    DVT- 1996 po knee replacement   Colitis    Complication of anesthesia    vomit x1 while on Morphine per pt   Depression    Diverticulosis    Dysrhythmia    Atrial fibrillation   Fatigue    Frequency of urination    GERD (gastroesophageal reflux disease)    History of colon polyps    History of DVT of lower extremity    POST LEFT TOTAL KNEE  1996   History of hiatal hernia    History of iron deficiency anemia 1996   iron infusion   History of migraine headaches    History of radiation therapy    left breast 03/27/2021-04/23/2021 Dr Antony Blackbird   History of rib fracture    Hyperlipidemia    Hypertension    IBS (irritable bowel syndrome)    Insomnia    Joint pain    MCI (mild cognitive impairment)    Melanoma (HCC) 2019   left leg    Migraine headache    hx of migraines    OA (osteoarthritis)    RIGHT SHOULDER   Osteopenia    Personal history of radiation therapy    Pneumonia    remote history   PONV (postoperative nausea and vomiting)    Recovering alcoholic (HCC)    SINCE 01-21-1980   Right rotator cuff tear    SOB (shortness of breath) on exertion  Substance abuse (HCC)    recovering alcoholic   Swallowing difficulty    Unspecified essential hypertension     Social History   Socioeconomic History   Marital status: Widowed    Spouse name: Not on file   Number of children: 3   Years of education: Not on file   Highest education level: Master's degree (e.g., MA, MS, MEng, MEd, MSW, MBA)  Occupational History   Occupation: retired Paramedic  Tobacco Use   Smoking status: Former    Current packs/day: 0.00    Average packs/day: 0.5 packs/day for 15.0 years (7.5 ttl pk-yrs)    Types: Cigarettes    Start date: 05/16/1970    Quit date: 05/15/1985     Years since quitting: 37.9   Smokeless tobacco: Never   Tobacco comments:    Former smoker 12/10/22  Vaping Use   Vaping status: Never Used  Substance and Sexual Activity   Alcohol use: No    Comment: RECOVERING ALCOHOLIC--  QUIT [id number]   Drug use: No   Sexual activity: Not Currently    Birth control/protection: Post-menopausal  Other Topics Concern   Not on file  Social History Narrative   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    Widowed    Three kids    6 grandchildren       Social Drivers of Corporate investment banker Strain: Low Risk  (04/18/2021)   Overall Financial Resource Strain (CARDIA)    Difficulty of Paying Living Expenses: Not hard at all  Food Insecurity: Low Risk  (09/04/2022)   Received from [facility]   Hunger Vital Sign    Worried About Running Out of Food in the Last Year: Never true    Ran Out of Food in the Last Year: Never true  Transportation Needs: Not on file (09/04/2022)  Physical Activity: Inactive (04/18/2021)   Exercise Vital Sign    Days of Exercise per Week: 0 days    Minutes of Exercise per Session: 0 min  Stress: No Stress Concern Present (04/18/2021)   [employer] of Occupational Health - Occupational Stress Questionnaire    Feeling of Stress : Not at all  Social Connections: Moderately Integrated (04/18/2021)   Social Connection and Isolation Panel [NHANES]    Frequency of Communication with Friends and Family: More than three times a week    Frequency of Social Gatherings with Friends and Family: More than three times a week    Attends Religious Services: More than 4 times per year    Active Member of [employer] or Organizations: Yes    Attends Banker Meetings: More than 4 times per year    Marital Status: Widowed  Intimate Partner Violence: Not At Risk (04/18/2021)   Humiliation, Afraid, Rape, and Kick questionnaire    Fear of Current or Ex-Partner: No    Emotionally Abused: No     Physically Abused: No    Sexually Abused: No    Past Surgical History:  Procedure Laterality Date   ATRIAL FIBRILLATION ABLATION N/A 10/13/2022   Procedure: ATRIAL FIBRILLATION ABLATION;  Surgeon: Lanier Prude, MD;  Location: [facility];  Service: Cardiovascular;  Laterality: N/A;   BREAST BIOPSY Left 01/06/2021   pos   BREAST LUMPECTOMY     BREAST LUMPECTOMY WITH RADIOACTIVE SEED LOCALIZATION Left 02/27/2021   Procedure: LEFT BREAST LUMPECTOMY WITH RADIOACTIVE SEED LOCALIZATION;  Surgeon: Almond Lint, MD;  Location: [facility];  Service: General;  Laterality: Left;   BUNIONECTOMY/  HAMMERTOE CORRECTION  RIGHT FOOT  2011   CATARACT EXTRACTION W/ INTRAOCULAR LENS  IMPLANT, BILATERAL     COLONOSCOPY     DILATION AND CURETTAGE OF UTERUS  1976   EYE SURGERY Bilateral 2016   cataract removal   KNEE ARTHROSCOPY W/ MENISCECTOMY Bilateral X2  LEFT /    X1  RIGHT   KNEE OPEN LATERAL RELEASE Bilateral    MOHS SURGERY Left 12/15/2017   Melanoma in situ - left calf - [facility]   ORIF ANKLE FRACTURE Right 08/04/2020   Procedure: OPEN REDUCTION INTERNAL FIXATION (ORIF) ANKLE FRACTURE;  Surgeon: Toni Arthurs, MD;  Location: [facility];  Service: Orthopedics;  Laterality: Right;  Mini C-arm, [employer] Small Frag   REPLACEMENT TOTAL KNEE Left 2006   SHOULDER ARTHROSCOPY WITH SUBACROMIAL DECOMPRESSION, ROTATOR CUFF REPAIR AND BICEP TENDON REPAIR Right 05/23/2013   Procedure: RIGHT SHOULDER ARTHROSCOPY EXAM UNDER ANESTHESIA  WITH SUBACROMIAL DECOMPRESSION,DISTAL CLAVICLE RESECTION, SADLABRAL DEBRIDEMENT CHONDROPLASTY, BICEP TENOTOMY ;  Surgeon: Eugenia Mcalpine, MD;  Location: [facility] [place];  Service: Orthopedics;  Laterality: Right;   TONSILLECTOMY AND ADENOIDECTOMY  AGE 66   TOTAL HIP ARTHROPLASTY Left 05/04/2017   Procedure: LEFT TOTAL HIP ARTHROPLASTY ANTERIOR APPROACH;  Surgeon: Durene Romans, MD;  Location: [facility];  Service: Orthopedics;  Laterality: Left;   TOTAL  HIP ARTHROPLASTY Right 08/01/2019   Procedure: TOTAL HIP ARTHROPLASTY ANTERIOR APPROACH;  Surgeon: Durene Romans, MD;  Location: [facility];  Service: Orthopedics;  Laterality: Right;    TOTAL KNEE ARTHROPLASTY Bilateral LEFT  1996/   RIGHT 2004   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      UPPER GASTROINTESTINAL ENDOSCOPY     UPPER GI ENDOSCOPY     VAGINAL HYSTERECTOMY  1976    Family History  Problem Relation Age of Onset   Cancer Mother    Heart disease Mother    Hypertension Mother    Melanoma Mother 63   Obesity Mother    Heart disease Father 76   Hypertension Father    Esophageal cancer Brother    Dementia Paternal Grandfather    Melanoma Niece 73   Colon cancer Neg Hx    Colon polyps Neg Hx    Stomach cancer Neg Hx    Rectal cancer Neg Hx     Allergies  Allergen Reactions   Nsaids Other (See Comments) and Anaphylaxis    SEVERE STOMACH CRAMPS, MOUTH SORES  **Able to tolerate Tylenol   Otezla [Apremilast] Anaphylaxis    Suicidal ideation   Penicillins Anaphylaxis and Other (See Comments)    Has patient had a PCN reaction causing immediate rash, facial/tongue/throat swelling, SOB or lightheadedness with hypotension:  Yes  Has patient had a PCN reaction causing severe rash involving mucus membranes or skin necrosis: Yes  Has patient had a PCN reaction that required hospitalization: No  Has patient had a PCN reaction occurring within the last 10 year No  If all of the above answers are "NO", then may proceed with Cephalosporin use.   Erythromycin Other (See Comments)    SEVERE STOMACH CRAMPS   Infliximab Other (See Comments)    Shut down immune system-BP high  Other Reaction(s): Not available   Morphine And Codeine Nausea And Vomiting   Penicillamine Other (See Comments)   Risankizumab Other (See Comments)    URI, fever, cough, UTI Skyrizi   Tolmetin     SEVERE STOMACH CRAMPS   Nickel Rash  Including snaps on hospital gowns     Current Outpatient  Medications on File Prior to Visit  Medication Sig Dispense Refill   acetaminophen (TYLENOL) 325 MG tablet Take 650 mg by mouth every 6 (six) hours as needed for moderate pain.     albuterol (VENTOLIN HFA) 108 (90 Base) MCG/ACT inhaler Inhale 2 puffs into the lungs every 6 (six) hours as needed for wheezing or shortness of breath (Cough;). Start with using in am, around noon and 1 hour before bedtime to help cough symptoms. 8 g 0   apixaban (ELIQUIS) 5 MG TABS tablet Take 1 tablet (5 mg total) by mouth 2 (two) times daily. 84 tablet    Ascorbic Acid (VITAMIN C) 1000 MG tablet Take 1,000 mg by mouth daily.     atorvastatin (LIPITOR) 40 MG tablet TAKE 1 TABLET BY MOUTH EVERY DAY 90 tablet 3   betamethasone dipropionate 0.05 % lotion APPLY TOPICALLY TWICE A DAY 60 mL 0   buPROPion ER (WELLBUTRIN SR) 100 MG 12 hr tablet Take 1 tablet (100 mg total) by mouth daily. 30 tablet 0   dextromethorphan-guaiFENesin (MUCINEX DM) 30-600 MG 12hr tablet Take 1 tablet by mouth 2 (two) times daily for 7 days. 14 tablet 0   HYDROcodone bit-homatropine (HYCODAN) 5-1.5 MG/5ML syrup Take 5 mLs by mouth every 8 (eight) hours as needed for cough (May cause drowsiness.). 120 mL 0   HYDROcodone-acetaminophen (NORCO/VICODIN) 5-325 MG tablet Take 2 tablets by mouth every 4 (four) hours as needed for moderate pain or severe pain.     hydrocortisone cream 1 % Apply 1 application topically daily as needed for itching.     losartan (COZAAR) 100 MG tablet Take 1 tablet (100 mg total) by mouth daily. 90 tablet 3   metoprolol tartrate (LOPRESSOR) 25 MG tablet TAKE 0.5 TABLETS BY MOUTH 2 TIMES DAILY. 90 tablet 1   omeprazole (PRILOSEC) 40 MG capsule TAKE 1 CAPSULE BY MOUTH 2 (TWO) TIMES DAILY. TAKE 30 MINUTES BEFORE BREAKFAST & DINNER. 180 capsule 1   ondansetron (ZOFRAN) 4 MG tablet Take 1 tablet (4 mg total) by mouth every 8 (eight) hours as needed for nausea or vomiting. 20 tablet 0   Soft Lens Products (REWETTING DROPS) SOLN Place  1 drop into both eyes daily as needed (Dry eyes).     sulfamethoxazole-trimethoprim (BACTRIM,SEPTRA) 400-80 MG tablet Take 1 tablet by mouth at bedtime. For urethritis.     tamoxifen (NOLVADEX) 10 MG tablet Take 1/2 tablet by mouth in the evening 90 tablet 1   traZODone (DESYREL) 50 MG tablet TAKE 1 TABLET BY MOUTH EVERYDAY AT BEDTIME 90 tablet 1   venlafaxine XR (EFFEXOR-XR) 150 MG 24 hr capsule Take 1 capsule (150 mg total) by mouth daily. 90 capsule 0   vitamin E 180 MG (400 UNITS) capsule Take 400 Units by mouth daily.     No current facility-administered medications on file prior to visit.    BP 120/80   Pulse 64   Temp 97.7 F (36.5 C) (Oral)   Ht 5\' 6"  (1.676 m)   Wt 231 lb (104.8 kg)   SpO2 97%   BMI 37.28 kg/m       Objective:   Physical Exam Vitals and nursing note reviewed.  Constitutional:      Appearance: Normal appearance.  Cardiovascular:     Rate and Rhythm: Normal rate and regular rhythm.     Pulses: Normal pulses.     Heart sounds: Normal heart sounds.  Pulmonary:  Effort: Pulmonary effort is normal.     Breath sounds: Normal breath sounds.  Musculoskeletal:        General: Normal range of motion.  Skin:    General: Skin is warm and dry.  Neurological:     General: No focal deficit present.     Mental Status: She is alert and oriented to person, place, and time.  Psychiatric:        Mood and Affect: Mood normal.        Behavior: Behavior normal.        Thought Content: Thought content normal.        Judgment: Judgment normal.       Assessment & Plan:  1. Influenza A (Primary) - Resolved and feeling better. She is free to return to her daily activities.   2. Acute cough - Can continue Mucinex PRN   Shirline Frees, NP  Time spent with patient today was 32 minutes which consisted of chart review, discussing cough and influenza, work up, treatment answering questions and documentation.

## 2023-04-08 NOTE — Addendum Note (Signed)
 Addended by: Thresa Ross on: 04/08/2023 02:30 PM   Modules accepted: Orders

## 2023-04-08 NOTE — Telephone Encounter (Signed)
 [id number] [place], [place] - [address]    venlafaxine XR (EFFEXOR-XR) 150 MG 24 hr capsule  LAST FILL DATE 01/06/23  NEXT APPT 05/10/23 LAST APPT  03/14/22

## 2023-04-09 ENCOUNTER — Other Ambulatory Visit (HOSPITAL_COMMUNITY): Payer: Self-pay | Admitting: Psychiatry

## 2023-04-12 DIAGNOSIS — M25652 Stiffness of left hip, not elsewhere classified: Secondary | ICD-10-CM | POA: Diagnosis not present

## 2023-04-12 DIAGNOSIS — M25552 Pain in left hip: Secondary | ICD-10-CM | POA: Diagnosis not present

## 2023-04-13 ENCOUNTER — Other Ambulatory Visit: Payer: Self-pay | Admitting: Cardiovascular Disease

## 2023-04-13 DIAGNOSIS — I480 Paroxysmal atrial fibrillation: Secondary | ICD-10-CM

## 2023-04-13 NOTE — Telephone Encounter (Signed)
 Prescription refill request for Eliquis received. Indication:afib Last office visit:2/25 Scr:0.62  2/25 Age: 78 Weight:104.8  kg  Prescription refilled

## 2023-04-14 ENCOUNTER — Inpatient Hospital Stay (HOSPITAL_BASED_OUTPATIENT_CLINIC_OR_DEPARTMENT_OTHER)
Admission: EM | Admit: 2023-04-14 | Discharge: 2023-04-20 | DRG: 552 | Disposition: A | Discharge status: Skilled Nursing Facility | Payer: Medicare Other | Attending: Internal Medicine | Admitting: Internal Medicine

## 2023-04-14 ENCOUNTER — Encounter (HOSPITAL_BASED_OUTPATIENT_CLINIC_OR_DEPARTMENT_OTHER): Payer: Self-pay

## 2023-04-14 ENCOUNTER — Other Ambulatory Visit: Payer: Self-pay

## 2023-04-14 ENCOUNTER — Emergency Department (HOSPITAL_BASED_OUTPATIENT_CLINIC_OR_DEPARTMENT_OTHER): Payer: Medicare Other

## 2023-04-14 DIAGNOSIS — S22020A Wedge compression fracture of second thoracic vertebra, initial encounter for closed fracture: Secondary | ICD-10-CM | POA: Diagnosis not present

## 2023-04-14 DIAGNOSIS — Z881 Allergy status to other antibiotic agents status: Secondary | ICD-10-CM

## 2023-04-14 DIAGNOSIS — M8580 Other specified disorders of bone density and structure, unspecified site: Secondary | ICD-10-CM | POA: Diagnosis present

## 2023-04-14 DIAGNOSIS — M47814 Spondylosis without myelopathy or radiculopathy, thoracic region: Secondary | ICD-10-CM | POA: Diagnosis not present

## 2023-04-14 DIAGNOSIS — E669 Obesity, unspecified: Secondary | ICD-10-CM | POA: Diagnosis present

## 2023-04-14 DIAGNOSIS — Z86718 Personal history of other venous thrombosis and embolism: Secondary | ICD-10-CM

## 2023-04-14 DIAGNOSIS — Z800 Family history of malignant neoplasm of digestive organs: Secondary | ICD-10-CM

## 2023-04-14 DIAGNOSIS — Z96653 Presence of artificial knee joint, bilateral: Secondary | ICD-10-CM | POA: Diagnosis present

## 2023-04-14 DIAGNOSIS — F1021 Alcohol dependence, in remission: Secondary | ICD-10-CM | POA: Diagnosis present

## 2023-04-14 DIAGNOSIS — Z6837 Body mass index (BMI) 37.0-37.9, adult: Secondary | ICD-10-CM

## 2023-04-14 DIAGNOSIS — Z96643 Presence of artificial hip joint, bilateral: Secondary | ICD-10-CM | POA: Diagnosis present

## 2023-04-14 DIAGNOSIS — Z808 Family history of malignant neoplasm of other organs or systems: Secondary | ICD-10-CM

## 2023-04-14 DIAGNOSIS — S0003XA Contusion of scalp, initial encounter: Secondary | ICD-10-CM | POA: Diagnosis not present

## 2023-04-14 DIAGNOSIS — M4854XA Collapsed vertebra, not elsewhere classified, thoracic region, initial encounter for fracture: Secondary | ICD-10-CM | POA: Diagnosis not present

## 2023-04-14 DIAGNOSIS — R296 Repeated falls: Secondary | ICD-10-CM | POA: Diagnosis present

## 2023-04-14 DIAGNOSIS — S32010A Wedge compression fracture of first lumbar vertebra, initial encounter for closed fracture: Secondary | ICD-10-CM | POA: Diagnosis not present

## 2023-04-14 DIAGNOSIS — Z9109 Other allergy status, other than to drugs and biological substances: Secondary | ICD-10-CM

## 2023-04-14 DIAGNOSIS — W19XXXA Unspecified fall, initial encounter: Principal | ICD-10-CM

## 2023-04-14 DIAGNOSIS — S32019A Unspecified fracture of first lumbar vertebra, initial encounter for closed fracture: Secondary | ICD-10-CM

## 2023-04-14 DIAGNOSIS — S3992XA Unspecified injury of lower back, initial encounter: Secondary | ICD-10-CM | POA: Diagnosis not present

## 2023-04-14 DIAGNOSIS — I4891 Unspecified atrial fibrillation: Secondary | ICD-10-CM | POA: Diagnosis present

## 2023-04-14 DIAGNOSIS — M4805 Spinal stenosis, thoracolumbar region: Secondary | ICD-10-CM | POA: Diagnosis not present

## 2023-04-14 DIAGNOSIS — Z85820 Personal history of malignant melanoma of skin: Secondary | ICD-10-CM

## 2023-04-14 DIAGNOSIS — E66812 Obesity, class 2: Secondary | ICD-10-CM | POA: Diagnosis present

## 2023-04-14 DIAGNOSIS — Z886 Allergy status to analgesic agent status: Secondary | ICD-10-CM

## 2023-04-14 DIAGNOSIS — W01198A Fall on same level from slipping, tripping and stumbling with subsequent striking against other object, initial encounter: Secondary | ICD-10-CM | POA: Diagnosis present

## 2023-04-14 DIAGNOSIS — S199XXA Unspecified injury of neck, initial encounter: Secondary | ICD-10-CM | POA: Diagnosis not present

## 2023-04-14 DIAGNOSIS — E785 Hyperlipidemia, unspecified: Secondary | ICD-10-CM | POA: Diagnosis present

## 2023-04-14 DIAGNOSIS — F32A Depression, unspecified: Secondary | ICD-10-CM | POA: Diagnosis present

## 2023-04-14 DIAGNOSIS — M4856XA Collapsed vertebra, not elsewhere classified, lumbar region, initial encounter for fracture: Secondary | ICD-10-CM | POA: Diagnosis present

## 2023-04-14 DIAGNOSIS — S299XXA Unspecified injury of thorax, initial encounter: Secondary | ICD-10-CM | POA: Diagnosis not present

## 2023-04-14 DIAGNOSIS — Z880 Allergy status to penicillin: Secondary | ICD-10-CM

## 2023-04-14 DIAGNOSIS — Z90710 Acquired absence of both cervix and uterus: Secondary | ICD-10-CM

## 2023-04-14 DIAGNOSIS — S22050A Wedge compression fracture of T5-T6 vertebra, initial encounter for closed fracture: Principal | ICD-10-CM | POA: Diagnosis present

## 2023-04-14 DIAGNOSIS — S0990XA Unspecified injury of head, initial encounter: Secondary | ICD-10-CM | POA: Diagnosis not present

## 2023-04-14 DIAGNOSIS — S065X0A Traumatic subdural hemorrhage without loss of consciousness, initial encounter: Secondary | ICD-10-CM | POA: Diagnosis not present

## 2023-04-14 DIAGNOSIS — M4850XA Collapsed vertebra, not elsewhere classified, site unspecified, initial encounter for fracture: Secondary | ICD-10-CM | POA: Diagnosis present

## 2023-04-14 DIAGNOSIS — M47816 Spondylosis without myelopathy or radiculopathy, lumbar region: Secondary | ICD-10-CM | POA: Diagnosis not present

## 2023-04-14 DIAGNOSIS — Z853 Personal history of malignant neoplasm of breast: Secondary | ICD-10-CM

## 2023-04-14 DIAGNOSIS — Z885 Allergy status to narcotic agent status: Secondary | ICD-10-CM

## 2023-04-14 DIAGNOSIS — Z86006 Personal history of melanoma in-situ: Secondary | ICD-10-CM

## 2023-04-14 DIAGNOSIS — S098XXA Other specified injuries of head, initial encounter: Secondary | ICD-10-CM

## 2023-04-14 DIAGNOSIS — M8588 Other specified disorders of bone density and structure, other site: Secondary | ICD-10-CM | POA: Diagnosis not present

## 2023-04-14 DIAGNOSIS — S129XXA Fracture of neck, unspecified, initial encounter: Secondary | ICD-10-CM | POA: Diagnosis present

## 2023-04-14 DIAGNOSIS — J101 Influenza due to other identified influenza virus with other respiratory manifestations: Secondary | ICD-10-CM | POA: Diagnosis present

## 2023-04-14 DIAGNOSIS — Z8249 Family history of ischemic heart disease and other diseases of the circulatory system: Secondary | ICD-10-CM

## 2023-04-14 DIAGNOSIS — Z923 Personal history of irradiation: Secondary | ICD-10-CM

## 2023-04-14 DIAGNOSIS — Z7901 Long term (current) use of anticoagulants: Secondary | ICD-10-CM

## 2023-04-14 DIAGNOSIS — Z860100 Personal history of colon polyps, unspecified: Secondary | ICD-10-CM

## 2023-04-14 DIAGNOSIS — Z87891 Personal history of nicotine dependence: Secondary | ICD-10-CM

## 2023-04-14 DIAGNOSIS — K219 Gastro-esophageal reflux disease without esophagitis: Secondary | ICD-10-CM | POA: Diagnosis present

## 2023-04-14 DIAGNOSIS — Z79899 Other long term (current) drug therapy: Secondary | ICD-10-CM

## 2023-04-14 DIAGNOSIS — I10 Essential (primary) hypertension: Secondary | ICD-10-CM | POA: Diagnosis present

## 2023-04-14 LAB — CBC WITH DIFFERENTIAL/PLATELET
Abs Immature Granulocytes: 0.08 10*3/uL — ABNORMAL HIGH (ref 0.00–0.07)
Basophils Absolute: 0 10*3/uL (ref 0.0–0.1)
Basophils Relative: 0 %
Eosinophils Absolute: 0.1 10*3/uL (ref 0.0–0.5)
Eosinophils Relative: 1 %
HCT: 30.6 % — ABNORMAL LOW (ref 36.0–46.0)
Hemoglobin: 10.4 g/dL — ABNORMAL LOW (ref 12.0–15.0)
Immature Granulocytes: 1 %
Lymphocytes Relative: 8 %
Lymphs Abs: 0.8 10*3/uL (ref 0.7–4.0)
MCH: 34.1 pg — ABNORMAL HIGH (ref 26.0–34.0)
MCHC: 34 g/dL (ref 30.0–36.0)
MCV: 100.3 fL — ABNORMAL HIGH (ref 80.0–100.0)
Monocytes Absolute: 0.6 10*3/uL (ref 0.1–1.0)
Monocytes Relative: 5 %
Neutro Abs: 9.2 10*3/uL — ABNORMAL HIGH (ref 1.7–7.7)
Neutrophils Relative %: 85 %
Platelets: 192 10*3/uL (ref 150–400)
RBC: 3.05 MIL/uL — ABNORMAL LOW (ref 3.87–5.11)
RDW: 12.1 % (ref 11.5–15.5)
WBC: 10.7 10*3/uL — ABNORMAL HIGH (ref 4.0–10.5)
nRBC: 0 % (ref 0.0–0.2)

## 2023-04-14 LAB — BASIC METABOLIC PANEL
Anion gap: 9 (ref 5–15)
BUN: 12 mg/dL (ref 8–23)
CO2: 23 mmol/L (ref 22–32)
Calcium: 9 mg/dL (ref 8.9–10.3)
Chloride: 107 mmol/L (ref 98–111)
Creatinine, Ser: 0.74 mg/dL (ref 0.44–1.00)
GFR, Estimated: >60 mL/min (ref >60)
Glucose, Bld: 148 mg/dL — ABNORMAL HIGH (ref 70–99)
Potassium: 3.5 mmol/L (ref 3.5–5.1)
Sodium: 139 mmol/L (ref 135–145)

## 2023-04-14 MED ORDER — ONDANSETRON 4 MG PO TBDP
4.0000 mg | ORAL_TABLET | Freq: Once | ORAL | Status: AC
  Administered 2023-04-14: 4 mg via ORAL
  Filled 2023-04-14: qty 1

## 2023-04-14 MED ORDER — HYDROCODONE-ACETAMINOPHEN 5-325 MG PO TABS
1.0000 | ORAL_TABLET | Freq: Once | ORAL | Status: AC
  Administered 2023-04-14: 1 via ORAL
  Filled 2023-04-14: qty 1

## 2023-04-14 MED ORDER — FENTANYL CITRATE PF 50 MCG/ML IJ SOSY: 50.0000 ug | PREFILLED_SYRINGE | Freq: Once | INTRAMUSCULAR | Status: AC

## 2023-04-14 MED ORDER — ACETAMINOPHEN 325 MG PO TABS
650.0000 mg | ORAL_TABLET | Freq: Once | ORAL | Status: AC
  Administered 2023-04-14: 650 mg via ORAL
  Filled 2023-04-14: qty 2

## 2023-04-14 MED ORDER — FENTANYL CITRATE PF 50 MCG/ML IJ SOSY
PREFILLED_SYRINGE | INTRAMUSCULAR | Status: AC
  Administered 2023-04-14: 50 ug via INTRAMUSCULAR
  Filled 2023-04-14: qty 1

## 2023-04-14 NOTE — Plan of Care (Signed)
 Plan of Care Note for accepted transfer  Patient: Tonya Bartlett    VOZ:[id number]  DOA: 04/14/2023     Facility requesting transfer: [facility]  Requesting Provider: Franne Forts, DO  Reason for transfer: Fall, vertebral fracture  Facility course:   89 F with hx of hx falls + prior R hip THA, R ankle ORIF, HTN, HLD, obesity, p/w fall to OSH ED. Imaging with T2, T6, L1 compression fractures. Needs obs for pain control, PT. Also Flu A positive, on RA. Advised to get CXR to eval. OSH Edp spoke w trauma rec for [facility] admit   Plan of care: The patient is accepted for admission to Med-surg  unit, at [facility]   Author: Dolly Rias, MD  04/14/2023  Check [email or web address] for on-call coverage.  Nursing staff, Please call [facility] Admits & Consults System-Wide number on Amion as soon as patient's arrival, so appropriate admitting provider can evaluate the pt.

## 2023-04-14 NOTE — ED Provider Notes (Incomplete)
 [place] EMERGENCY DEPARTMENT AT [facility] Provider Note   CSN: [id number] Arrival date & time: 04/14/23  1939     History {Add pertinent medical, surgical, social history, OB history to HPI:1} Chief Complaint  Patient presents with   Tonya Bartlett is a 78 y.o. female.  Patient is a 78 year old female was try to get into her car when she fell backwards and hit her buttocks and then the posterior aspect of her head on the concrete.  Patient admits to bleeding.  Admits to headache and nausea.  On Eliquis for A-fib.  Admits to low back pain.  Denies any extremity bony pain.  The history is provided by the patient. No language interpreter was used.  Fall Pertinent negatives include no chest pain, no abdominal pain and no shortness of breath.       Home Medications Prior to Admission medications   Medication Sig Start Date End Date Taking? Authorizing Provider  acetaminophen (TYLENOL) 325 MG tablet Take 650 mg by mouth every 6 (six) hours as needed for moderate pain.    [provider]  albuterol (VENTOLIN HFA) 108 (90 Base) MCG/ACT inhaler Inhale 2 puffs into the lungs every 6 (six) hours as needed for wheezing or shortness of breath (Cough;). Start with using in am, around noon and 1 hour before bedtime to help cough symptoms. 03/26/23   Dulce Sellar, NP  Ascorbic Acid (VITAMIN C) 1000 MG tablet Take 1,000 mg by mouth daily.    [provider]  atorvastatin (LIPITOR) 40 MG tablet TAKE 1 TABLET BY MOUTH EVERY DAY 09/16/22   Runell Gess, MD  betamethasone dipropionate 0.05 % lotion APPLY TOPICALLY TWICE A DAY 09/16/22   Nafziger, Kandee Keen, NP  buPROPion ER [facility] SR) 100 MG 12 hr tablet TAKE 1 TABLET BY MOUTH EVERY DAY 04/09/23   Thresa Ross, MD  ELIQUIS 5 MG TABS tablet TAKE 1 TABLET BY MOUTH TWICE A DAY 04/13/23   Runell Gess, MD  HYDROcodone bit-homatropine (HYCODAN) 5-1.5 MG/5ML syrup Take 5 mLs by mouth every 8 (eight) hours  as needed for cough (May cause drowsiness.). 03/26/23   Dulce Sellar, NP  HYDROcodone-acetaminophen (NORCO/VICODIN) 5-325 MG tablet Take 2 tablets by mouth every 4 (four) hours as needed for moderate pain or severe pain. 03/07/21   [provider]  hydrocortisone cream 1 % Apply 1 application topically daily as needed for itching.    [provider]  losartan (COZAAR) 100 MG tablet Take 1 tablet (100 mg total) by mouth daily. 06/17/22   Nafziger, Kandee Keen, NP  metoprolol tartrate (LOPRESSOR) 25 MG tablet TAKE 0.5 TABLETS BY MOUTH 2 TIMES DAILY. 05/14/22   Little Ishikawa, MD  omeprazole (PRILOSEC) 40 MG capsule TAKE 1 CAPSULE BY MOUTH 2 (TWO) TIMES DAILY. TAKE 30 MINUTES BEFORE BREAKFAST & DINNER. 10/15/22   Arnaldo Natal, NP  ondansetron (ZOFRAN) 4 MG tablet Take 1 tablet (4 mg total) by mouth every 8 (eight) hours as needed for nausea or vomiting. 02/03/23   Nafziger, Kandee Keen, NP  Soft Lens Products (REWETTING DROPS) SOLN Place 1 drop into both eyes daily as needed (Dry eyes).    [provider]  sulfamethoxazole-trimethoprim (BACTRIM,SEPTRA) 400-80 MG tablet Take 1 tablet by mouth at bedtime. For urethritis.    Ihor Gully, MD  tamoxifen (NOLVADEX) 10 MG tablet Take 1/2 tablet by mouth in the evening 01/25/23   Serena Croissant, MD  traZODone (DESYREL) 50 MG tablet TAKE 1 TABLET BY  MOUTH EVERYDAY AT BEDTIME 02/11/23   Nafziger, Kandee Keen, NP  venlafaxine XR (EFFEXOR-XR) 150 MG 24 hr capsule Take 1 capsule (150 mg total) by mouth daily. 04/08/23   Thresa Ross, MD  vitamin E 180 MG (400 UNITS) capsule Take 400 Units by mouth daily.    [provider]      Allergies    Nsaids, Otezla [apremilast], Penicillins, Erythromycin, Infliximab, Morphine and codeine, Penicillamine, Risankizumab, Tolmetin, and Nickel    Review of Systems   Review of Systems  Constitutional:  Negative for chills and fever.  HENT:  Negative for ear pain and sore throat.   Eyes:   Negative for pain and visual disturbance.  Respiratory:  Negative for cough and shortness of breath.   Cardiovascular:  Negative for chest pain and palpitations.  Gastrointestinal:  Negative for abdominal pain and vomiting.  Genitourinary:  Negative for dysuria and hematuria.  Musculoskeletal:  Negative for arthralgias and back pain.  Skin:  Positive for wound. Negative for color change and rash.  Neurological:  Negative for seizures and syncope.  All other systems reviewed and are negative.   Physical Exam Updated Vital Signs BP (!) 150/115   Pulse 65   Temp 98.2 F (36.8 C)   Resp 20   Ht 5\' 6"  (1.676 m)   Wt 104.3 kg   SpO2 98%   BMI 37.12 kg/m  Physical Exam Vitals and nursing note reviewed.  Constitutional:      General: She is not in acute distress.    Appearance: She is well-developed.  HENT:     Head:   Eyes:     Conjunctiva/sclera: Conjunctivae normal.  Cardiovascular:     Rate and Rhythm: Normal rate and regular rhythm.     Heart sounds: No murmur heard. Pulmonary:     Effort: Pulmonary effort is normal. No respiratory distress.     Breath sounds: Normal breath sounds.  Abdominal:     Palpations: Abdomen is soft.     Tenderness: There is no abdominal tenderness.  Musculoskeletal:        General: No swelling.     Cervical back: Neck supple. No bony tenderness.     Thoracic back: No bony tenderness.     Lumbar back: Bony tenderness present.  Skin:    General: Skin is warm and dry.     Capillary Refill: Capillary refill takes less than 2 seconds.  Neurological:     Mental Status: She is alert and oriented to person, place, and time.     GCS: GCS eye subscore is 4. GCS verbal subscore is 5. GCS motor subscore is 6.     Cranial Nerves: Cranial nerves 2-12 are intact.     Sensory: Sensation is intact.     Motor: Motor function is intact.     Coordination: Coordination is intact.  Psychiatric:        Mood and Affect: Mood normal.     ED Results /  Procedures / Treatments   Labs (all labs ordered are listed, but only abnormal results are displayed) Labs Reviewed  CBC WITH DIFFERENTIAL/PLATELET  BASIC METABOLIC PANEL    EKG None  Radiology CT Thoracic Spine Wo Contrast Result Date: 04/14/2023 CLINICAL DATA:  Back trauma. EXAM: CT THORACIC SPINE WITHOUT CONTRAST TECHNIQUE: Multidetector CT images of the thoracic were obtained using the standard protocol without intravenous contrast. RADIATION DOSE REDUCTION: This exam was performed according to the departmental dose-optimization program which includes automated exposure control, adjustment of the mA  and/or kV according to patient size and/or use of iterative reconstruction technique. COMPARISON:  CT of the chest 04/07/2017 FINDINGS: Alignment: Normal. Vertebrae: Bones are diffusely osteopenic. There is mild compression fracture of the superior endplate of T2 which is age indeterminate. There is mild compression deformity of the superior endplate of T6 which is age indeterminate, but favored as acute. No retropulsion of fracture fragments. There is 5% loss vertebral body height at both levels. There is mild dextroconvex curvature of the midthoracic spine. Paraspinal and other soft tissues: Negative. Disc levels: There is mild disc space narrowing and endplate osteophyte formation throughout the thoracic spine compatible with degenerative change. No significant central canal or neural foraminal stenosis identified. IMPRESSION: 1. Mild compression fractures of the superior endplates of T2 and T6 are age indeterminate, but favored as acute. No retropulsion of fracture fragments. 2. Diffuse osteopenia. 3. Mild degenerative changes of the thoracic spine. Electronically Signed   By: Darliss Cheney M.D.   On: 04/14/2023 21:06   CT Lumbar Spine Wo Contrast Result Date: 04/14/2023 CLINICAL DATA:  Trauma EXAM: CT LUMBAR SPINE WITHOUT CONTRAST TECHNIQUE: Multidetector CT imaging of the lumbar spine was  performed without intravenous contrast administration. Multiplanar CT image reconstructions were also generated. RADIATION DOSE REDUCTION: This exam was performed according to the departmental dose-optimization program which includes automated exposure control, adjustment of the mA and/or kV according to patient size and/or use of iterative reconstruction technique. COMPARISON:  CT lumbar spine 07/04/2011 FINDINGS: Segmentation: 5 lumbar type vertebrae. Alignment: Normal. Vertebrae: The bones are diffusely osteopenic. There is an acute fracture involving the L1 vertebral body. There is 5% loss of vertebral body height with no retropulsion of fracture fragments. Paraspinal and other soft tissues: Negative. Disc levels: There is moderate disc space narrowing at L5-S1. There is mild disc space narrowing at T12-L1 and L1-L2. Facet arthropathy seen bilaterally at L4-L5 and L5-S1. There is no significant central canal or neural foraminal stenosis at any level. IMPRESSION: 1. Acute fracture of the L1 vertebral body with 5% loss of vertebral body height. No retropulsion of fracture fragments. 2. Diffuse osteopenia. 3. Degenerative changes of the lumbar spine. Electronically Signed   By: Darliss Cheney M.D.   On: 04/14/2023 20:56   CT Head Wo Contrast Result Date: 04/14/2023 CLINICAL DATA:  Recent fall with blunt head trauma, initial encounter EXAM: CT HEAD WITHOUT CONTRAST CT CERVICAL SPINE WITHOUT CONTRAST TECHNIQUE: Multidetector CT imaging of the head and cervical spine was performed following the standard protocol without intravenous contrast. Multiplanar CT image reconstructions of the cervical spine were also generated. RADIATION DOSE REDUCTION: This exam was performed according to the departmental dose-optimization program which includes automated exposure control, adjustment of the mA and/or kV according to patient size and/or use of iterative reconstruction technique. COMPARISON:  01/08/2023 FINDINGS: CT HEAD  FINDINGS Brain: No evidence of acute infarction, hemorrhage, hydrocephalus, extra-axial collection or mass lesion/mass effect. Vascular: No hyperdense vessel or unexpected calcification. Skull: Normal. Negative for fracture or focal lesion. Sinuses/Orbits: No acute finding. Other: Scalp hematoma is noted near the vertex on right. Traumatic Brain Injury Risk Stratification Skull Fracture: No - Low/mBIG 1 Subdural Hematoma (SDH): No - Low Subarachnoid Hemorrhage [facility]): No Epidural Hematoma (EDH): No - Low/mBIG 1 Cerebral contusion, intra-axial, intraparenchymal Hemorrhage (IPH): No Intraventricular Hemorrhage (IVH): No - Low/mBIG 1 Midline Shift > 1mm or Edema/effacement of sulci/vents: No - Low/mBIG 1 ---------------------------------------------------- CT CERVICAL SPINE FINDINGS Alignment: Mild straightening of the normal cervical lordosis likely related to muscular spasm. Skull  base and vertebrae: 7 cervical segments are well visualized. Vertebral body height is well maintained. No acute fracture or acute facet abnormality seen. Mild osteophytic changes are seen as well as mild facet hypertrophic changes. The odontoid is within normal limits. Soft tissues and spinal canal: Surrounding soft tissue structures are within normal limits. Upper chest: Visualized lung apices are. Other: None IMPRESSION: CT of the head: No acute intracranial abnormality noted. Scalp hematoma at the vertex on the right. CT of the cervical spine: Degenerative changes with findings suggestive of muscular spasm. No acute fracture is seen. Electronically Signed   By: Alcide Clever M.D.   On: 04/14/2023 20:52   CT Cervical Spine Wo Contrast Result Date: 04/14/2023 CLINICAL DATA:  Recent fall with blunt head trauma, initial encounter EXAM: CT HEAD WITHOUT CONTRAST CT CERVICAL SPINE WITHOUT CONTRAST TECHNIQUE: Multidetector CT imaging of the head and cervical spine was performed following the standard protocol without intravenous contrast.  Multiplanar CT image reconstructions of the cervical spine were also generated. RADIATION DOSE REDUCTION: This exam was performed according to the departmental dose-optimization program which includes automated exposure control, adjustment of the mA and/or kV according to patient size and/or use of iterative reconstruction technique. COMPARISON:  01/08/2023 FINDINGS: CT HEAD FINDINGS Brain: No evidence of acute infarction, hemorrhage, hydrocephalus, extra-axial collection or mass lesion/mass effect. Vascular: No hyperdense vessel or unexpected calcification. Skull: Normal. Negative for fracture or focal lesion. Sinuses/Orbits: No acute finding. Other: Scalp hematoma is noted near the vertex on right. Traumatic Brain Injury Risk Stratification Skull Fracture: No - Low/mBIG 1 Subdural Hematoma (SDH): No - Low Subarachnoid Hemorrhage [facility]): No Epidural Hematoma (EDH): No - Low/mBIG 1 Cerebral contusion, intra-axial, intraparenchymal Hemorrhage (IPH): No Intraventricular Hemorrhage (IVH): No - Low/mBIG 1 Midline Shift > 1mm or Edema/effacement of sulci/vents: No - Low/mBIG 1 ---------------------------------------------------- CT CERVICAL SPINE FINDINGS Alignment: Mild straightening of the normal cervical lordosis likely related to muscular spasm. Skull base and vertebrae: 7 cervical segments are well visualized. Vertebral body height is well maintained. No acute fracture or acute facet abnormality seen. Mild osteophytic changes are seen as well as mild facet hypertrophic changes. The odontoid is within normal limits. Soft tissues and spinal canal: Surrounding soft tissue structures are within normal limits. Upper chest: Visualized lung apices are. Other: None IMPRESSION: CT of the head: No acute intracranial abnormality noted. Scalp hematoma at the vertex on the right. CT of the cervical spine: Degenerative changes with findings suggestive of muscular spasm. No acute fracture is seen. Electronically Signed   By: Alcide Clever M.D.   On: 04/14/2023 20:52    Procedures Procedures  {Document cardiac monitor, telemetry assessment procedure when appropriate:1}  Medications Ordered in ED Medications  fentaNYL (SUBLIMAZE) injection 50 mcg (has no administration in time range)  fentaNYL (SUBLIMAZE) 50 MCG/ML injection (has no administration in time range)  ondansetron (ZOFRAN-ODT) disintegrating tablet 4 mg (4 mg Oral Given 04/14/23 1955)  acetaminophen (TYLENOL) tablet 650 mg (650 mg Oral Given 04/14/23 2008)    ED Course/ Medical Decision Making/ A&P   {   Click here for ABCD2, HEART and other calculatorsREFRESH Note before signing :1}                              Medical Decision Making Amount and/or Complexity of Data Reviewed Labs: ordered. Radiology: ordered.  Risk OTC drugs. Prescription drug management.   ***  {Document critical care time when appropriate:1} {Document review  of labs and clinical decision tools ie heart score, Chads2Vasc2 etc:1}  {Document your independent review of radiology images, and any outside records:1} {Document your discussion with family members, caretakers, and with consultants:1} {Document social determinants of health affecting pt's care:1} {Document your decision making why or why not admission, treatments were needed:1} Final Clinical Impression(s) / ED Diagnoses Final diagnoses:  Fall, initial encounter  Blunt head trauma, initial encounter  Hematoma of scalp, initial encounter  Closed fracture of first lumbar vertebra, unspecified fracture morphology, initial encounter (HCC)  Closed wedge compression fracture of T2 vertebra, initial encounter (HCC)  Closed wedge compression fracture of T6 vertebra, initial encounter (HCC)    Rx / DC Orders ED Discharge Orders     None

## 2023-04-14 NOTE — ED Notes (Signed)
 RN assisted MD in controlling bleeding and cleaning up patient. Patient resting comfortably in bed, bleeding controlled.

## 2023-04-14 NOTE — ED Triage Notes (Signed)
 Fall while getting into car just prior to arrival. Says car moved on her and she fell to ground hitting head on concrete driveway. Bleeding present but controlled.   Compliant on Eliquis for afib.   Also c/o lower midline back pain.

## 2023-04-15 DIAGNOSIS — W01198A Fall on same level from slipping, tripping and stumbling with subsequent striking against other object, initial encounter: Secondary | ICD-10-CM | POA: Diagnosis present

## 2023-04-15 DIAGNOSIS — M8580 Other specified disorders of bone density and structure, unspecified site: Secondary | ICD-10-CM | POA: Diagnosis present

## 2023-04-15 DIAGNOSIS — Z90710 Acquired absence of both cervix and uterus: Secondary | ICD-10-CM | POA: Diagnosis not present

## 2023-04-15 DIAGNOSIS — M51369 Other intervertebral disc degeneration, lumbar region without mention of lumbar back pain or lower extremity pain: Secondary | ICD-10-CM | POA: Diagnosis not present

## 2023-04-15 DIAGNOSIS — M6281 Muscle weakness (generalized): Secondary | ICD-10-CM | POA: Diagnosis not present

## 2023-04-15 DIAGNOSIS — R2681 Unsteadiness on feet: Secondary | ICD-10-CM | POA: Diagnosis not present

## 2023-04-15 DIAGNOSIS — S32010A Wedge compression fracture of first lumbar vertebra, initial encounter for closed fracture: Secondary | ICD-10-CM | POA: Diagnosis present

## 2023-04-15 DIAGNOSIS — Z7901 Long term (current) use of anticoagulants: Secondary | ICD-10-CM | POA: Diagnosis not present

## 2023-04-15 DIAGNOSIS — Z86718 Personal history of other venous thrombosis and embolism: Secondary | ICD-10-CM | POA: Diagnosis not present

## 2023-04-15 DIAGNOSIS — Z85820 Personal history of malignant melanoma of skin: Secondary | ICD-10-CM | POA: Diagnosis not present

## 2023-04-15 DIAGNOSIS — Z8249 Family history of ischemic heart disease and other diseases of the circulatory system: Secondary | ICD-10-CM | POA: Diagnosis not present

## 2023-04-15 DIAGNOSIS — K589 Irritable bowel syndrome without diarrhea: Secondary | ICD-10-CM | POA: Diagnosis not present

## 2023-04-15 DIAGNOSIS — W19XXXD Unspecified fall, subsequent encounter: Secondary | ICD-10-CM | POA: Diagnosis not present

## 2023-04-15 DIAGNOSIS — S22020A Wedge compression fracture of second thoracic vertebra, initial encounter for closed fracture: Secondary | ICD-10-CM | POA: Diagnosis present

## 2023-04-15 DIAGNOSIS — E785 Hyperlipidemia, unspecified: Secondary | ICD-10-CM | POA: Diagnosis present

## 2023-04-15 DIAGNOSIS — I4891 Unspecified atrial fibrillation: Secondary | ICD-10-CM | POA: Diagnosis present

## 2023-04-15 DIAGNOSIS — J101 Influenza due to other identified influenza virus with other respiratory manifestations: Secondary | ICD-10-CM | POA: Diagnosis present

## 2023-04-15 DIAGNOSIS — F1021 Alcohol dependence, in remission: Secondary | ICD-10-CM | POA: Diagnosis present

## 2023-04-15 DIAGNOSIS — D649 Anemia, unspecified: Secondary | ICD-10-CM | POA: Diagnosis not present

## 2023-04-15 DIAGNOSIS — E66812 Obesity, class 2: Secondary | ICD-10-CM | POA: Diagnosis present

## 2023-04-15 DIAGNOSIS — F32A Depression, unspecified: Secondary | ICD-10-CM | POA: Diagnosis present

## 2023-04-15 DIAGNOSIS — S32010D Wedge compression fracture of first lumbar vertebra, subsequent encounter for fracture with routine healing: Secondary | ICD-10-CM | POA: Diagnosis not present

## 2023-04-15 DIAGNOSIS — Z96653 Presence of artificial knee joint, bilateral: Secondary | ICD-10-CM | POA: Diagnosis present

## 2023-04-15 DIAGNOSIS — R2689 Other abnormalities of gait and mobility: Secondary | ICD-10-CM | POA: Diagnosis not present

## 2023-04-15 DIAGNOSIS — Z96643 Presence of artificial hip joint, bilateral: Secondary | ICD-10-CM | POA: Diagnosis present

## 2023-04-15 DIAGNOSIS — Z923 Personal history of irradiation: Secondary | ICD-10-CM | POA: Diagnosis not present

## 2023-04-15 DIAGNOSIS — S22050A Wedge compression fracture of T5-T6 vertebra, initial encounter for closed fracture: Secondary | ICD-10-CM | POA: Diagnosis present

## 2023-04-15 DIAGNOSIS — M4856XA Collapsed vertebra, not elsewhere classified, lumbar region, initial encounter for fracture: Secondary | ICD-10-CM | POA: Diagnosis present

## 2023-04-15 DIAGNOSIS — S0990XA Unspecified injury of head, initial encounter: Secondary | ICD-10-CM | POA: Diagnosis not present

## 2023-04-15 DIAGNOSIS — Z853 Personal history of malignant neoplasm of breast: Secondary | ICD-10-CM | POA: Diagnosis not present

## 2023-04-15 DIAGNOSIS — S129XXA Fracture of neck, unspecified, initial encounter: Secondary | ICD-10-CM | POA: Diagnosis present

## 2023-04-15 DIAGNOSIS — S22050D Wedge compression fracture of T5-T6 vertebra, subsequent encounter for fracture with routine healing: Secondary | ICD-10-CM | POA: Diagnosis not present

## 2023-04-15 DIAGNOSIS — Z6837 Body mass index (BMI) 37.0-37.9, adult: Secondary | ICD-10-CM | POA: Diagnosis not present

## 2023-04-15 DIAGNOSIS — S0003XA Contusion of scalp, initial encounter: Secondary | ICD-10-CM | POA: Diagnosis present

## 2023-04-15 DIAGNOSIS — Z87891 Personal history of nicotine dependence: Secondary | ICD-10-CM | POA: Diagnosis not present

## 2023-04-15 DIAGNOSIS — Z743 Need for continuous supervision: Secondary | ICD-10-CM | POA: Diagnosis not present

## 2023-04-15 DIAGNOSIS — S22000D Wedge compression fracture of unspecified thoracic vertebra, subsequent encounter for fracture with routine healing: Secondary | ICD-10-CM | POA: Diagnosis not present

## 2023-04-15 DIAGNOSIS — E669 Obesity, unspecified: Secondary | ICD-10-CM | POA: Diagnosis present

## 2023-04-15 DIAGNOSIS — I10 Essential (primary) hypertension: Secondary | ICD-10-CM | POA: Diagnosis present

## 2023-04-15 DIAGNOSIS — S22020D Wedge compression fracture of second thoracic vertebra, subsequent encounter for fracture with routine healing: Secondary | ICD-10-CM | POA: Diagnosis not present

## 2023-04-15 LAB — HEMOGLOBIN AND HEMATOCRIT, BLOOD
HCT: 29.8 % — ABNORMAL LOW (ref 36.0–46.0)
Hemoglobin: 10.2 g/dL — ABNORMAL LOW (ref 12.0–15.0)

## 2023-04-15 MED ORDER — FENTANYL CITRATE PF 50 MCG/ML IJ SOSY
50.0000 ug | PREFILLED_SYRINGE | Freq: Once | INTRAMUSCULAR | Status: AC
  Administered 2023-04-15: 50 ug via INTRAVENOUS

## 2023-04-15 MED ORDER — ONDANSETRON HCL 4 MG PO TABS: 4.0000 mg | ORAL_TABLET | Freq: Four times a day (QID) | ORAL | Status: DC | PRN

## 2023-04-15 MED ORDER — ONDANSETRON HCL 4 MG/2ML IJ SOLN: 4.0000 mg | Freq: Four times a day (QID) | INTRAMUSCULAR | Status: DC | PRN

## 2023-04-15 MED ORDER — CYCLOBENZAPRINE HCL 5 MG PO TABS
5.0000 mg | ORAL_TABLET | Freq: Three times a day (TID) | ORAL | Status: DC | PRN
  Administered 2023-04-15 – 2023-04-18 (×8): 5 mg via ORAL
  Filled 2023-04-15 (×8): qty 1

## 2023-04-15 MED ORDER — SENNOSIDES-DOCUSATE SODIUM 8.6-50 MG PO TABS: 1.0000 | ORAL_TABLET | Freq: Every evening | ORAL | Status: DC | PRN

## 2023-04-15 MED ORDER — FENTANYL CITRATE PF 50 MCG/ML IJ SOSY
50.0000 ug | PREFILLED_SYRINGE | Freq: Once | INTRAMUSCULAR | Status: AC
  Administered 2023-04-15: 50 ug via INTRAVENOUS
  Filled 2023-04-15: qty 1

## 2023-04-15 MED ORDER — FENTANYL CITRATE PF 50 MCG/ML IJ SOSY
50.0000 ug | PREFILLED_SYRINGE | Freq: Once | INTRAMUSCULAR | Status: DC
  Filled 2023-04-15: qty 1

## 2023-04-15 MED ORDER — METOPROLOL TARTRATE 12.5 MG HALF TABLET: 12.5000 mg | ORAL_TABLET | Freq: Two times a day (BID) | ORAL | Status: DC

## 2023-04-15 MED ORDER — POLYETHYLENE GLYCOL 3350 17 G PO PACK
17.0000 g | PACK | Freq: Every day | ORAL | Status: DC | PRN
  Administered 2023-04-15 – 2023-04-17 (×3): 17 g via ORAL
  Filled 2023-04-15 (×3): qty 1

## 2023-04-15 MED ORDER — ACETAMINOPHEN 325 MG PO TABS
650.0000 mg | ORAL_TABLET | Freq: Four times a day (QID) | ORAL | Status: DC | PRN
  Administered 2023-04-15 – 2023-04-16 (×3): 650 mg via ORAL
  Filled 2023-04-15 (×3): qty 2

## 2023-04-15 MED ORDER — ACETAMINOPHEN 650 MG RE SUPP: 650.0000 mg | Freq: Four times a day (QID) | RECTAL | Status: DC | PRN

## 2023-04-15 MED ORDER — METOPROLOL TARTRATE 12.5 MG HALF TABLET
12.5000 mg | ORAL_TABLET | Freq: Two times a day (BID) | ORAL | Status: DC
  Administered 2023-04-15 – 2023-04-20 (×11): 12.5 mg via ORAL
  Filled 2023-04-15 (×11): qty 1

## 2023-04-15 MED ORDER — OXYCODONE HCL 5 MG PO TABS
2.5000 mg | ORAL_TABLET | Freq: Four times a day (QID) | ORAL | Status: DC | PRN
  Administered 2023-04-15 – 2023-04-16 (×4): 5 mg via ORAL
  Filled 2023-04-15 (×4): qty 1

## 2023-04-15 MED ORDER — HYDROMORPHONE HCL 1 MG/ML IJ SOLN
0.5000 mg | INTRAMUSCULAR | Status: DC | PRN
  Administered 2023-04-15 – 2023-04-17 (×5): 1 mg via INTRAVENOUS
  Filled 2023-04-15 (×5): qty 1

## 2023-04-15 NOTE — ED Provider Notes (Incomplete)
 [place] EMERGENCY DEPARTMENT AT [facility] Provider Note   CSN: [id number] Arrival date & time: 04/14/23  1939     History {Add pertinent medical, surgical, social history, OB history to HPI:1} Chief Complaint  Patient presents with  . Fall    Tonya Bartlett is a 78 y.o. female.  Patient is a 78 year old female was try to get into her car when she fell backwards and hit her buttocks and then the posterior aspect of her head on the concrete.  Patient admits to bleeding.  Admits to headache and nausea.  On Eliquis for A-fib.  Admits to low back pain.  Denies any extremity bony pain.  The history is provided by the patient. No language interpreter was used.  Fall Pertinent negatives include no chest pain, no abdominal pain and no shortness of breath.       Home Medications Prior to Admission medications   Medication Sig Start Date End Date Taking? Authorizing Provider  acetaminophen (TYLENOL) 325 MG tablet Take 650 mg by mouth every 6 (six) hours as needed for moderate pain.    [provider]  albuterol (VENTOLIN HFA) 108 (90 Base) MCG/ACT inhaler Inhale 2 puffs into the lungs every 6 (six) hours as needed for wheezing or shortness of breath (Cough;). Start with using in am, around noon and 1 hour before bedtime to help cough symptoms. 03/26/23   Dulce Sellar, NP  Ascorbic Acid (VITAMIN C) 1000 MG tablet Take 1,000 mg by mouth daily.    [provider]  atorvastatin (LIPITOR) 40 MG tablet TAKE 1 TABLET BY MOUTH EVERY DAY 09/16/22   Runell Gess, MD  betamethasone dipropionate 0.05 % lotion APPLY TOPICALLY TWICE A DAY 09/16/22   Nafziger, Kandee Keen, NP  buPROPion ER [facility] SR) 100 MG 12 hr tablet TAKE 1 TABLET BY MOUTH EVERY DAY 04/09/23   Thresa Ross, MD  ELIQUIS 5 MG TABS tablet TAKE 1 TABLET BY MOUTH TWICE A DAY 04/13/23   Runell Gess, MD  HYDROcodone bit-homatropine (HYCODAN) 5-1.5 MG/5ML syrup Take 5 mLs by mouth every 8 (eight)  hours as needed for cough (May cause drowsiness.). 03/26/23   Dulce Sellar, NP  HYDROcodone-acetaminophen (NORCO/VICODIN) 5-325 MG tablet Take 2 tablets by mouth every 4 (four) hours as needed for moderate pain or severe pain. 03/07/21   [provider]  hydrocortisone cream 1 % Apply 1 application topically daily as needed for itching.    [provider]  losartan (COZAAR) 100 MG tablet Take 1 tablet (100 mg total) by mouth daily. 06/17/22   Nafziger, Kandee Keen, NP  metoprolol tartrate (LOPRESSOR) 25 MG tablet TAKE 0.5 TABLETS BY MOUTH 2 TIMES DAILY. 05/14/22   Little Ishikawa, MD  omeprazole (PRILOSEC) 40 MG capsule TAKE 1 CAPSULE BY MOUTH 2 (TWO) TIMES DAILY. TAKE 30 MINUTES BEFORE BREAKFAST & DINNER. 10/15/22   Arnaldo Natal, NP  ondansetron (ZOFRAN) 4 MG tablet Take 1 tablet (4 mg total) by mouth every 8 (eight) hours as needed for nausea or vomiting. 02/03/23   Nafziger, Kandee Keen, NP  Soft Lens Products (REWETTING DROPS) SOLN Place 1 drop into both eyes daily as needed (Dry eyes).    [provider]  sulfamethoxazole-trimethoprim (BACTRIM,SEPTRA) 400-80 MG tablet Take 1 tablet by mouth at bedtime. For urethritis.    Ihor Gully, MD  tamoxifen (NOLVADEX) 10 MG tablet Take 1/2 tablet by mouth in the evening 01/25/23   Serena Croissant, MD  traZODone (DESYREL) 50 MG tablet TAKE 1 TABLET BY  MOUTH EVERYDAY AT BEDTIME 02/11/23   Nafziger, Kandee Keen, NP  venlafaxine XR (EFFEXOR-XR) 150 MG 24 hr capsule Take 1 capsule (150 mg total) by mouth daily. 04/08/23   Thresa Ross, MD  vitamin E 180 MG (400 UNITS) capsule Take 400 Units by mouth daily.    [provider]      Allergies    Nsaids, Otezla [apremilast], Penicillins, Erythromycin, Infliximab, Morphine and codeine, Penicillamine, Risankizumab, Tolmetin, and Nickel    Review of Systems   Review of Systems  Constitutional:  Negative for chills and fever.  HENT:  Negative for ear pain and sore throat.    Eyes:  Negative for pain and visual disturbance.  Respiratory:  Negative for cough and shortness of breath.   Cardiovascular:  Negative for chest pain and palpitations.  Gastrointestinal:  Negative for abdominal pain and vomiting.  Genitourinary:  Negative for dysuria and hematuria.  Musculoskeletal:  Negative for arthralgias and back pain.  Skin:  Positive for wound. Negative for color change and rash.  Neurological:  Negative for seizures and syncope.  All other systems reviewed and are negative.   Physical Exam Updated Vital Signs BP (!) 150/115   Pulse 65   Temp 98.2 F (36.8 C)   Resp 20   Ht 5\' 6"  (1.676 m)   Wt 104.3 kg   SpO2 98%   BMI 37.12 kg/m  Physical Exam Vitals and nursing note reviewed.  Constitutional:      General: She is not in acute distress.    Appearance: She is well-developed.  HENT:     Head:   Eyes:     Conjunctiva/sclera: Conjunctivae normal.  Cardiovascular:     Rate and Rhythm: Normal rate and regular rhythm.     Heart sounds: No murmur heard. Pulmonary:     Effort: Pulmonary effort is normal. No respiratory distress.     Breath sounds: Normal breath sounds.  Abdominal:     Palpations: Abdomen is soft.     Tenderness: There is no abdominal tenderness.  Musculoskeletal:        General: No swelling.     Cervical back: Neck supple. No bony tenderness.     Thoracic back: No bony tenderness.     Lumbar back: Bony tenderness present.  Skin:    General: Skin is warm and dry.     Capillary Refill: Capillary refill takes less than 2 seconds.  Neurological:     Mental Status: She is alert and oriented to person, place, and time.     GCS: GCS eye subscore is 4. GCS verbal subscore is 5. GCS motor subscore is 6.     Cranial Nerves: Cranial nerves 2-12 are intact.     Sensory: Sensation is intact.     Motor: Motor function is intact.     Coordination: Coordination is intact.  Psychiatric:        Mood and Affect: Mood normal.     ED  Results / Procedures / Treatments   Labs (all labs ordered are listed, but only abnormal results are displayed) Labs Reviewed  CBC WITH DIFFERENTIAL/PLATELET  BASIC METABOLIC PANEL    EKG None  Radiology CT Thoracic Spine Wo Contrast Result Date: 04/14/2023 CLINICAL DATA:  Back trauma. EXAM: CT THORACIC SPINE WITHOUT CONTRAST TECHNIQUE: Multidetector CT images of the thoracic were obtained using the standard protocol without intravenous contrast. RADIATION DOSE REDUCTION: This exam was performed according to the departmental dose-optimization program which includes automated exposure control, adjustment of the mA  and/or kV according to patient size and/or use of iterative reconstruction technique. COMPARISON:  CT of the chest 04/07/2017 FINDINGS: Alignment: Normal. Vertebrae: Bones are diffusely osteopenic. There is mild compression fracture of the superior endplate of T2 which is age indeterminate. There is mild compression deformity of the superior endplate of T6 which is age indeterminate, but favored as acute. No retropulsion of fracture fragments. There is 5% loss vertebral body height at both levels. There is mild dextroconvex curvature of the midthoracic spine. Paraspinal and other soft tissues: Negative. Disc levels: There is mild disc space narrowing and endplate osteophyte formation throughout the thoracic spine compatible with degenerative change. No significant central canal or neural foraminal stenosis identified. IMPRESSION: 1. Mild compression fractures of the superior endplates of T2 and T6 are age indeterminate, but favored as acute. No retropulsion of fracture fragments. 2. Diffuse osteopenia. 3. Mild degenerative changes of the thoracic spine. Electronically Signed   By: Darliss Cheney M.D.   On: 04/14/2023 21:06   CT Lumbar Spine Wo Contrast Result Date: 04/14/2023 CLINICAL DATA:  Trauma EXAM: CT LUMBAR SPINE WITHOUT CONTRAST TECHNIQUE: Multidetector CT imaging of the lumbar spine  was performed without intravenous contrast administration. Multiplanar CT image reconstructions were also generated. RADIATION DOSE REDUCTION: This exam was performed according to the departmental dose-optimization program which includes automated exposure control, adjustment of the mA and/or kV according to patient size and/or use of iterative reconstruction technique. COMPARISON:  CT lumbar spine 07/04/2011 FINDINGS: Segmentation: 5 lumbar type vertebrae. Alignment: Normal. Vertebrae: The bones are diffusely osteopenic. There is an acute fracture involving the L1 vertebral body. There is 5% loss of vertebral body height with no retropulsion of fracture fragments. Paraspinal and other soft tissues: Negative. Disc levels: There is moderate disc space narrowing at L5-S1. There is mild disc space narrowing at T12-L1 and L1-L2. Facet arthropathy seen bilaterally at L4-L5 and L5-S1. There is no significant central canal or neural foraminal stenosis at any level. IMPRESSION: 1. Acute fracture of the L1 vertebral body with 5% loss of vertebral body height. No retropulsion of fracture fragments. 2. Diffuse osteopenia. 3. Degenerative changes of the lumbar spine. Electronically Signed   By: Darliss Cheney M.D.   On: 04/14/2023 20:56   CT Head Wo Contrast Result Date: 04/14/2023 CLINICAL DATA:  Recent fall with blunt head trauma, initial encounter EXAM: CT HEAD WITHOUT CONTRAST CT CERVICAL SPINE WITHOUT CONTRAST TECHNIQUE: Multidetector CT imaging of the head and cervical spine was performed following the standard protocol without intravenous contrast. Multiplanar CT image reconstructions of the cervical spine were also generated. RADIATION DOSE REDUCTION: This exam was performed according to the departmental dose-optimization program which includes automated exposure control, adjustment of the mA and/or kV according to patient size and/or use of iterative reconstruction technique. COMPARISON:  01/08/2023 FINDINGS: CT HEAD  FINDINGS Brain: No evidence of acute infarction, hemorrhage, hydrocephalus, extra-axial collection or mass lesion/mass effect. Vascular: No hyperdense vessel or unexpected calcification. Skull: Normal. Negative for fracture or focal lesion. Sinuses/Orbits: No acute finding. Other: Scalp hematoma is noted near the vertex on right. Traumatic Brain Injury Risk Stratification Skull Fracture: No - Low/mBIG 1 Subdural Hematoma (SDH): No - Low Subarachnoid Hemorrhage [facility]): No Epidural Hematoma (EDH): No - Low/mBIG 1 Cerebral contusion, intra-axial, intraparenchymal Hemorrhage (IPH): No Intraventricular Hemorrhage (IVH): No - Low/mBIG 1 Midline Shift > 1mm or Edema/effacement of sulci/vents: No - Low/mBIG 1 ---------------------------------------------------- CT CERVICAL SPINE FINDINGS Alignment: Mild straightening of the normal cervical lordosis likely related to muscular spasm. Skull  base and vertebrae: 7 cervical segments are well visualized. Vertebral body height is well maintained. No acute fracture or acute facet abnormality seen. Mild osteophytic changes are seen as well as mild facet hypertrophic changes. The odontoid is within normal limits. Soft tissues and spinal canal: Surrounding soft tissue structures are within normal limits. Upper chest: Visualized lung apices are. Other: None IMPRESSION: CT of the head: No acute intracranial abnormality noted. Scalp hematoma at the vertex on the right. CT of the cervical spine: Degenerative changes with findings suggestive of muscular spasm. No acute fracture is seen. Electronically Signed   By: Alcide Clever M.D.   On: 04/14/2023 20:52   CT Cervical Spine Wo Contrast Result Date: 04/14/2023 CLINICAL DATA:  Recent fall with blunt head trauma, initial encounter EXAM: CT HEAD WITHOUT CONTRAST CT CERVICAL SPINE WITHOUT CONTRAST TECHNIQUE: Multidetector CT imaging of the head and cervical spine was performed following the standard protocol without intravenous contrast.  Multiplanar CT image reconstructions of the cervical spine were also generated. RADIATION DOSE REDUCTION: This exam was performed according to the departmental dose-optimization program which includes automated exposure control, adjustment of the mA and/or kV according to patient size and/or use of iterative reconstruction technique. COMPARISON:  01/08/2023 FINDINGS: CT HEAD FINDINGS Brain: No evidence of acute infarction, hemorrhage, hydrocephalus, extra-axial collection or mass lesion/mass effect. Vascular: No hyperdense vessel or unexpected calcification. Skull: Normal. Negative for fracture or focal lesion. Sinuses/Orbits: No acute finding. Other: Scalp hematoma is noted near the vertex on right. Traumatic Brain Injury Risk Stratification Skull Fracture: No - Low/mBIG 1 Subdural Hematoma (SDH): No - Low Subarachnoid Hemorrhage [facility]): No Epidural Hematoma (EDH): No - Low/mBIG 1 Cerebral contusion, intra-axial, intraparenchymal Hemorrhage (IPH): No Intraventricular Hemorrhage (IVH): No - Low/mBIG 1 Midline Shift > 1mm or Edema/effacement of sulci/vents: No - Low/mBIG 1 ---------------------------------------------------- CT CERVICAL SPINE FINDINGS Alignment: Mild straightening of the normal cervical lordosis likely related to muscular spasm. Skull base and vertebrae: 7 cervical segments are well visualized. Vertebral body height is well maintained. No acute fracture or acute facet abnormality seen. Mild osteophytic changes are seen as well as mild facet hypertrophic changes. The odontoid is within normal limits. Soft tissues and spinal canal: Surrounding soft tissue structures are within normal limits. Upper chest: Visualized lung apices are. Other: None IMPRESSION: CT of the head: No acute intracranial abnormality noted. Scalp hematoma at the vertex on the right. CT of the cervical spine: Degenerative changes with findings suggestive of muscular spasm. No acute fracture is seen. Electronically Signed   By: Alcide Clever M.D.   On: 04/14/2023 20:52    Procedures Procedures  {Document cardiac monitor, telemetry assessment procedure when appropriate:1}  Medications Ordered in ED Medications  fentaNYL (SUBLIMAZE) injection 50 mcg (has no administration in time range)  fentaNYL (SUBLIMAZE) 50 MCG/ML injection (has no administration in time range)  ondansetron (ZOFRAN-ODT) disintegrating tablet 4 mg (4 mg Oral Given 04/14/23 1955)  acetaminophen (TYLENOL) tablet 650 mg (650 mg Oral Given 04/14/23 2008)    ED Course/ Medical Decision Making/ A&P   {   Click here for ABCD2, HEART and other calculatorsREFRESH Note before signing :1}                              Medical Decision Making Amount and/or Complexity of Data Reviewed Labs: ordered. Radiology: ordered.  Risk OTC drugs. Prescription drug management. Decision regarding hospitalization.  78 year old female was try to get into her  car when she fell backwards and hit her buttocks and then the posterior aspect of her head on the concrete.  {Document critical care time when appropriate:1} {Document review of labs and clinical decision tools ie heart score, Chads2Vasc2 etc:1}  {Document your independent review of radiology images, and any outside records:1} {Document your discussion with family members, caretakers, and with consultants:1} {Document social determinants of health affecting pt's care:1} {Document your decision making why or why not admission, treatments were needed:1} Final Clinical Impression(s) / ED Diagnoses Final diagnoses:  Fall, initial encounter  Blunt head trauma, initial encounter  Hematoma of scalp, initial encounter  Closed fracture of first lumbar vertebra, unspecified fracture morphology, initial encounter (HCC)  Closed wedge compression fracture of T2 vertebra, initial encounter (HCC)  Closed wedge compression fracture of T6 vertebra, initial encounter (HCC)    Rx / DC Orders ED Discharge Orders     None
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
## 2023-04-15 NOTE — ED Notes (Signed)
 Patient's wound on back of scalp reopend and started to bleeding again. RN applied pressure to area to lessen bleed. Notified MD.
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
## 2023-04-16 DIAGNOSIS — S32010A Wedge compression fracture of first lumbar vertebra, initial encounter for closed fracture: Secondary | ICD-10-CM | POA: Diagnosis not present

## 2023-04-16 LAB — CBC
HCT: 24.6 % — ABNORMAL LOW (ref 36.0–46.0)
Hemoglobin: 8.1 g/dL — ABNORMAL LOW (ref 12.0–15.0)
MCH: 33.3 pg (ref 26.0–34.0)
MCHC: 32.9 g/dL (ref 30.0–36.0)
MCV: 101.2 fL — ABNORMAL HIGH (ref 80.0–100.0)
Platelets: 179 10*3/uL (ref 150–400)
RBC: 2.43 MIL/uL — ABNORMAL LOW (ref 3.87–5.11)
RDW: 12.3 % (ref 11.5–15.5)
WBC: 7.3 10*3/uL (ref 4.0–10.5)
nRBC: 0 % (ref 0.0–0.2)

## 2023-04-16 LAB — BASIC METABOLIC PANEL
Anion gap: 3 — ABNORMAL LOW (ref 5–15)
BUN: 8 mg/dL (ref 8–23)
CO2: 30 mmol/L (ref 22–32)
Calcium: 8.4 mg/dL — ABNORMAL LOW (ref 8.9–10.3)
Chloride: 103 mmol/L (ref 98–111)
Creatinine, Ser: 0.63 mg/dL (ref 0.44–1.00)
GFR, Estimated: >60 mL/min (ref >60)
Glucose, Bld: 111 mg/dL — ABNORMAL HIGH (ref 70–99)
Potassium: 3.6 mmol/L (ref 3.5–5.1)
Sodium: 136 mmol/L (ref 135–145)

## 2023-04-16 MED ORDER — OXYCODONE HCL ER 10 MG PO T12A
10.0000 mg | EXTENDED_RELEASE_TABLET | Freq: Two times a day (BID) | ORAL | Status: DC
  Administered 2023-04-16 – 2023-04-18 (×4): 10 mg via ORAL
  Filled 2023-04-16 (×4): qty 1

## 2023-04-16 MED ORDER — ACETAMINOPHEN 650 MG RE SUPP: 650.0000 mg | Freq: Four times a day (QID) | RECTAL | Status: DC | PRN

## 2023-04-16 MED ORDER — ACETAMINOPHEN 325 MG PO TABS
650.0000 mg | ORAL_TABLET | ORAL | Status: DC | PRN
  Administered 2023-04-16 – 2023-04-18 (×7): 650 mg via ORAL
  Filled 2023-04-16 (×7): qty 2

## 2023-04-16 MED ORDER — OXYCODONE HCL 5 MG PO TABS
2.5000 mg | ORAL_TABLET | ORAL | Status: DC | PRN
  Administered 2023-04-16 – 2023-04-20 (×13): 5 mg via ORAL
  Filled 2023-04-16 (×13): qty 1

## 2023-04-16 MED ORDER — POTASSIUM CHLORIDE CRYS ER 20 MEQ PO TBCR
40.0000 meq | EXTENDED_RELEASE_TABLET | Freq: Once | ORAL | Status: AC
  Administered 2023-04-16: 40 meq via ORAL
  Filled 2023-04-16: qty 2

## 2023-04-16 MED ORDER — ATORVASTATIN CALCIUM 40 MG PO TABS
40.0000 mg | ORAL_TABLET | Freq: Every day | ORAL | Status: DC
  Administered 2023-04-16 – 2023-04-20 (×5): 40 mg via ORAL
  Filled 2023-04-16 (×5): qty 1

## 2023-04-16 MED ORDER — ENOXAPARIN SODIUM 40 MG/0.4ML IJ SOSY
40.0000 mg | PREFILLED_SYRINGE | INTRAMUSCULAR | Status: DC
  Administered 2023-04-16 – 2023-04-19 (×4): 40 mg via SUBCUTANEOUS
  Filled 2023-04-16 (×4): qty 0.4

## 2023-04-16 MED ORDER — HYDROXYZINE HCL 25 MG PO TABS
25.0000 mg | ORAL_TABLET | Freq: Once | ORAL | Status: AC
  Administered 2023-04-16: 25 mg via ORAL
  Filled 2023-04-16: qty 1

## 2023-04-16 MED ORDER — LOSARTAN POTASSIUM 50 MG PO TABS
100.0000 mg | ORAL_TABLET | Freq: Every day | ORAL | Status: DC
  Administered 2023-04-16 – 2023-04-20 (×5): 100 mg via ORAL
  Filled 2023-04-16 (×5): qty 2

## 2023-04-16 NOTE — Progress Notes (Signed)
 PROGRESS NOTE    Tonya Bartlett  LKG:[id number] DOB: 1945/10/24 DOA: 04/14/2023 PCP: Shirline Frees, NP  Outpatient Specialists:     Brief Narrative:  Patient is a 78 year old female, with past medical history significant for hypertension, hyperlipidemia, obesity, IBS, melanoma, breast cancer, and atrial fibrillation on Eliquis.  Patient presents with recurrent mechanical falls, T2, T6 and L1 compression fractures.  Patient reports significant back pain.  04/16/2023: Patient continues to report significant back pain.   Assessment & Plan:   Principal Problem:   Vertebral compression fracture (HCC) Active Problems:   Closed fracture of cervical vertebral body (HCC)   Closed compression fracture of body of L1 vertebra (HCC)  Mechanical fall, T2, T6 and L1 compression fractures As per prior documentation: Trauma surgery called at intake, appreciate insight recommendations - No current indication for intervention -PT OT consulted -Defer to surgical team for further recommendations on bracing or imaging -Optimize pain control (start oxycodone ER 10 Mg p.o. twice daily).  Continue on the pain regimen.    Hypertension: -Continue to optimize.  Hyperlipidemia:  Atrial fibrillation: -Heart rate ranges from 103 to 110 bpm. -Eliquis is on hold.  DVT prophylaxis: Eliquis is on hold.  Consider subcutaneous Lovenox for now. Code Status: Patient is full code. Family Communication: Daughter. Disposition Plan: Patient remains inpatient.   Consultants:  No documented consult note.  Procedures:  None.  Antimicrobials:  None.   Subjective: Patient continues to report back pain.  Objective: Vitals:   04/15/23 2237 04/16/23 0320 04/16/23 0815 04/16/23 1156  BP: 124/76 139/68 (!) 157/51 (!) 147/72  Pulse: 79   (!) 103  Resp: 20  18 16   Temp: 98.8 F (37.1 C) 98.5 F (36.9 C) 98.3 F (36.8 C) 98.9 F (37.2 C)  TempSrc: Oral Oral Oral Oral  SpO2: 92% 93% 92% 92%  Weight:       Height:        Intake/Output Summary (Last 24 hours) at 04/16/2023 1343 Last data filed at 04/16/2023 0830 Gross per 24 hour  Intake 840 ml  Output 200 ml  Net 640 ml   Filed Weights   04/14/23 1947  Weight: 104.3 kg    Examination:  General exam: Appears calm and comfortable  Respiratory system: Clear to auscultation.  Cardiovascular system: S1 & S2 heard Gastrointestinal system: Abdomen is soft and nontender. Central nervous system: Awake and alert.  Power left lower extremity 2+ to 3/5 and right lower extremity 3+ to 4/5. Extremities: No leg edema.  Data Reviewed: I have personally reviewed following labs and imaging studies  CBC: Recent Labs  Lab 04/14/23 2248 04/15/23 0024 04/16/23 0552  WBC 10.7*  --  7.3  NEUTROABS 9.2*  --   --   HGB 10.4* 10.2* 8.1*  HCT 30.6* 29.8* 24.6*  MCV 100.3*  --  101.2*  PLT 192  --  179   Basic Metabolic Panel: Recent Labs  Lab 04/14/23 2248 04/16/23 0552  NA 139 136  K 3.5 3.6  CL 107 103  CO2 23 30  GLUCOSE 148* 111*  BUN 12 8  CREATININE 0.74 0.63  CALCIUM 9.0 8.4*   GFR: Estimated Creatinine Clearance: 70.7 mL/min (by C-G formula based on SCr of 0.63 mg/dL). Liver Function Tests: No results for input(s): "AST", "ALT", "ALKPHOS", "BILITOT", "PROT", "ALBUMIN" in the last 168 hours. No results for input(s): "LIPASE", "AMYLASE" in the last 168 hours. No results for input(s): "AMMONIA" in the last 168 hours. Coagulation Profile: No results for input(s): "INR", "  PROTIME" in the last 168 hours. Cardiac Enzymes: No results for input(s): "CKTOTAL", "CKMB", "CKMBINDEX", "TROPONINI" in the last 168 hours. BNP (last 3 results) Recent Labs    01/20/23 1059  PROBNP 219   HbA1C: No results for input(s): "HGBA1C" in the last 72 hours. CBG: No results for input(s): "GLUCAP" in the last 168 hours. Lipid Profile: No results for input(s): "CHOL", "HDL", "LDLCALC", "TRIG", "CHOLHDL", "LDLDIRECT" in the last 72  hours. Thyroid Function Tests: No results for input(s): "TSH", "T4TOTAL", "FREET4", "T3FREE", "THYROIDAB" in the last 72 hours. Anemia Panel: No results for input(s): "VITAMINB12", "FOLATE", "FERRITIN", "TIBC", "IRON", "RETICCTPCT" in the last 72 hours. Urine analysis:    Component Value Date/Time   COLORURINE YELLOW 10/29/2014 0125   APPEARANCEUR CLEAR 10/29/2014 0125   LABSPEC 1.012 10/29/2014 0125   PHURINE 6.5 10/29/2014 0125   GLUCOSEU NEGATIVE 10/29/2014 0125   HGBUR TRACE (A) 10/29/2014 0125   HGBUR trace-lysed 10/29/2008 1057   BILIRUBINUR N 07/20/2017 1134   KETONESUR 40 (A) 10/29/2014 0125   PROTEINUR Positive (A) 07/20/2017 1134   PROTEINUR NEGATIVE 10/29/2014 0125   UROBILINOGEN 0.2 07/20/2017 1134   UROBILINOGEN 1.0 10/29/2014 0125   NITRITE N 07/20/2017 1134   NITRITE NEGATIVE 10/29/2014 0125   LEUKOCYTESUR Negative 07/20/2017 1134   Sepsis Labs: @LABRCNTIP (procalcitonin:4,lacticidven:4)  )No results found for this or any previous visit (from the past 240 hours).       Radiology Studies: CT Thoracic Spine Wo Contrast Result Date: 04/14/2023 CLINICAL DATA:  Back trauma. EXAM: CT THORACIC SPINE WITHOUT CONTRAST TECHNIQUE: Multidetector CT images of the thoracic were obtained using the standard protocol without intravenous contrast. RADIATION DOSE REDUCTION: This exam was performed according to the departmental dose-optimization program which includes automated exposure control, adjustment of the mA and/or kV according to patient size and/or use of iterative reconstruction technique. COMPARISON:  CT of the chest 04/07/2017 FINDINGS: Alignment: Normal. Vertebrae: Bones are diffusely osteopenic. There is mild compression fracture of the superior endplate of T2 which is age indeterminate. There is mild compression deformity of the superior endplate of T6 which is age indeterminate, but favored as acute. No retropulsion of fracture fragments. There is 5% loss vertebral body  height at both levels. There is mild dextroconvex curvature of the midthoracic spine. Paraspinal and other soft tissues: Negative. Disc levels: There is mild disc space narrowing and endplate osteophyte formation throughout the thoracic spine compatible with degenerative change. No significant central canal or neural foraminal stenosis identified. IMPRESSION: 1. Mild compression fractures of the superior endplates of T2 and T6 are age indeterminate, but favored as acute. No retropulsion of fracture fragments. 2. Diffuse osteopenia. 3. Mild degenerative changes of the thoracic spine. Electronically Signed   By: Darliss Cheney M.D.   On: 04/14/2023 21:06   CT Lumbar Spine Wo Contrast Result Date: 04/14/2023 CLINICAL DATA:  Trauma EXAM: CT LUMBAR SPINE WITHOUT CONTRAST TECHNIQUE: Multidetector CT imaging of the lumbar spine was performed without intravenous contrast administration. Multiplanar CT image reconstructions were also generated. RADIATION DOSE REDUCTION: This exam was performed according to the departmental dose-optimization program which includes automated exposure control, adjustment of the mA and/or kV according to patient size and/or use of iterative reconstruction technique. COMPARISON:  CT lumbar spine 07/04/2011 FINDINGS: Segmentation: 5 lumbar type vertebrae. Alignment: Normal. Vertebrae: The bones are diffusely osteopenic. There is an acute fracture involving the L1 vertebral body. There is 5% loss of vertebral body height with no retropulsion of fracture fragments. Paraspinal and other soft tissues: Negative. Disc  levels: There is moderate disc space narrowing at L5-S1. There is mild disc space narrowing at T12-L1 and L1-L2. Facet arthropathy seen bilaterally at L4-L5 and L5-S1. There is no significant central canal or neural foraminal stenosis at any level. IMPRESSION: 1. Acute fracture of the L1 vertebral body with 5% loss of vertebral body height. No retropulsion of fracture fragments. 2.  Diffuse osteopenia. 3. Degenerative changes of the lumbar spine. Electronically Signed   By: Darliss Cheney M.D.   On: 04/14/2023 20:56   CT Head Wo Contrast Result Date: 04/14/2023 CLINICAL DATA:  Recent fall with blunt head trauma, initial encounter EXAM: CT HEAD WITHOUT CONTRAST CT CERVICAL SPINE WITHOUT CONTRAST TECHNIQUE: Multidetector CT imaging of the head and cervical spine was performed following the standard protocol without intravenous contrast. Multiplanar CT image reconstructions of the cervical spine were also generated. RADIATION DOSE REDUCTION: This exam was performed according to the departmental dose-optimization program which includes automated exposure control, adjustment of the mA and/or kV according to patient size and/or use of iterative reconstruction technique. COMPARISON:  01/08/2023 FINDINGS: CT HEAD FINDINGS Brain: No evidence of acute infarction, hemorrhage, hydrocephalus, extra-axial collection or mass lesion/mass effect. Vascular: No hyperdense vessel or unexpected calcification. Skull: Normal. Negative for fracture or focal lesion. Sinuses/Orbits: No acute finding. Other: Scalp hematoma is noted near the vertex on right. Traumatic Brain Injury Risk Stratification Skull Fracture: No - Low/mBIG 1 Subdural Hematoma (SDH): No - Low Subarachnoid Hemorrhage [facility]): No Epidural Hematoma (EDH): No - Low/mBIG 1 Cerebral contusion, intra-axial, intraparenchymal Hemorrhage (IPH): No Intraventricular Hemorrhage (IVH): No - Low/mBIG 1 Midline Shift > 1mm or Edema/effacement of sulci/vents: No - Low/mBIG 1 ---------------------------------------------------- CT CERVICAL SPINE FINDINGS Alignment: Mild straightening of the normal cervical lordosis likely related to muscular spasm. Skull base and vertebrae: 7 cervical segments are well visualized. Vertebral body height is well maintained. No acute fracture or acute facet abnormality seen. Mild osteophytic changes are seen as well as mild facet  hypertrophic changes. The odontoid is within normal limits. Soft tissues and spinal canal: Surrounding soft tissue structures are within normal limits. Upper chest: Visualized lung apices are. Other: None IMPRESSION: CT of the head: No acute intracranial abnormality noted. Scalp hematoma at the vertex on the right. CT of the cervical spine: Degenerative changes with findings suggestive of muscular spasm. No acute fracture is seen. Electronically Signed   By: Alcide Clever M.D.   On: 04/14/2023 20:52   CT Cervical Spine Wo Contrast Result Date: 04/14/2023 CLINICAL DATA:  Recent fall with blunt head trauma, initial encounter EXAM: CT HEAD WITHOUT CONTRAST CT CERVICAL SPINE WITHOUT CONTRAST TECHNIQUE: Multidetector CT imaging of the head and cervical spine was performed following the standard protocol without intravenous contrast. Multiplanar CT image reconstructions of the cervical spine were also generated. RADIATION DOSE REDUCTION: This exam was performed according to the departmental dose-optimization program which includes automated exposure control, adjustment of the mA and/or kV according to patient size and/or use of iterative reconstruction technique. COMPARISON:  01/08/2023 FINDINGS: CT HEAD FINDINGS Brain: No evidence of acute infarction, hemorrhage, hydrocephalus, extra-axial collection or mass lesion/mass effect. Vascular: No hyperdense vessel or unexpected calcification. Skull: Normal. Negative for fracture or focal lesion. Sinuses/Orbits: No acute finding. Other: Scalp hematoma is noted near the vertex on right. Traumatic Brain Injury Risk Stratification Skull Fracture: No - Low/mBIG 1 Subdural Hematoma (SDH): No - Low Subarachnoid Hemorrhage [facility]): No Epidural Hematoma (EDH): No - Low/mBIG 1 Cerebral contusion, intra-axial, intraparenchymal Hemorrhage (IPH): No Intraventricular Hemorrhage (IVH): No -  Low/mBIG 1 Midline Shift > 1mm or Edema/effacement of sulci/vents: No - Low/mBIG 1  ---------------------------------------------------- CT CERVICAL SPINE FINDINGS Alignment: Mild straightening of the normal cervical lordosis likely related to muscular spasm. Skull base and vertebrae: 7 cervical segments are well visualized. Vertebral body height is well maintained. No acute fracture or acute facet abnormality seen. Mild osteophytic changes are seen as well as mild facet hypertrophic changes. The odontoid is within normal limits. Soft tissues and spinal canal: Surrounding soft tissue structures are within normal limits. Upper chest: Visualized lung apices are. Other: None IMPRESSION: CT of the head: No acute intracranial abnormality noted. Scalp hematoma at the vertex on the right. CT of the cervical spine: Degenerative changes with findings suggestive of muscular spasm. No acute fracture is seen. Electronically Signed   By: Alcide Clever M.D.   On: 04/14/2023 20:52        Scheduled Meds:  metoprolol tartrate  12.5 mg Oral BID   Continuous Infusions:   LOS: 1 day    Time spent: 35 minutes.    Berton Mount, MD  [facility] Pager #: [phone] 7PM-7AM contact night coverage as above

## 2023-04-17 ENCOUNTER — Other Ambulatory Visit: Payer: Self-pay | Admitting: Family

## 2023-04-17 DIAGNOSIS — S32010D Wedge compression fracture of first lumbar vertebra, subsequent encounter for fracture with routine healing: Secondary | ICD-10-CM

## 2023-04-17 DIAGNOSIS — B349 Viral infection, unspecified: Secondary | ICD-10-CM

## 2023-04-17 DIAGNOSIS — J208 Acute bronchitis due to other specified organisms: Secondary | ICD-10-CM

## 2023-04-17 LAB — RENAL FUNCTION PANEL
Albumin: 2.9 g/dL — ABNORMAL LOW (ref 3.5–5.0)
Anion gap: 11 (ref 5–15)
BUN: 6 mg/dL — ABNORMAL LOW (ref 8–23)
CO2: 24 mmol/L (ref 22–32)
Calcium: 8.3 mg/dL — ABNORMAL LOW (ref 8.9–10.3)
Chloride: 100 mmol/L (ref 98–111)
Creatinine, Ser: 0.62 mg/dL (ref 0.44–1.00)
GFR, Estimated: >60 mL/min (ref >60)
Glucose, Bld: 105 mg/dL — ABNORMAL HIGH (ref 70–99)
Phosphorus: 3.7 mg/dL (ref 2.5–4.6)
Potassium: 3.7 mmol/L (ref 3.5–5.1)
Sodium: 135 mmol/L (ref 135–145)

## 2023-04-17 LAB — CBC WITH DIFFERENTIAL/PLATELET
Abs Immature Granulocytes: 0.04 10*3/uL (ref 0.00–0.07)
Basophils Absolute: 0.1 10*3/uL (ref 0.0–0.1)
Basophils Relative: 1 %
Eosinophils Absolute: 0.2 10*3/uL (ref 0.0–0.5)
Eosinophils Relative: 3 %
HCT: 22.2 % — ABNORMAL LOW (ref 36.0–46.0)
Hemoglobin: 7.5 g/dL — ABNORMAL LOW (ref 12.0–15.0)
Immature Granulocytes: 1 %
Lymphocytes Relative: 17 %
Lymphs Abs: 1.2 10*3/uL (ref 0.7–4.0)
MCH: 34.1 pg — ABNORMAL HIGH (ref 26.0–34.0)
MCHC: 33.8 g/dL (ref 30.0–36.0)
MCV: 100.9 fL — ABNORMAL HIGH (ref 80.0–100.0)
Monocytes Absolute: 0.9 10*3/uL (ref 0.1–1.0)
Monocytes Relative: 13 %
Neutro Abs: 4.8 10*3/uL (ref 1.7–7.7)
Neutrophils Relative %: 65 %
Platelets: 156 10*3/uL (ref 150–400)
RBC: 2.2 MIL/uL — ABNORMAL LOW (ref 3.87–5.11)
RDW: 12.6 % (ref 11.5–15.5)
WBC: 7.3 10*3/uL (ref 4.0–10.5)
nRBC: 0 % (ref 0.0–0.2)

## 2023-04-17 LAB — MAGNESIUM: Magnesium: 1.9 mg/dL (ref 1.7–2.4)

## 2023-04-17 MED ORDER — BUPROPION HCL ER (SR) 100 MG PO TB12
100.0000 mg | ORAL_TABLET | Freq: Every day | ORAL | Status: DC
  Administered 2023-04-17 – 2023-04-19 (×3): 100 mg via ORAL
  Filled 2023-04-17 (×4): qty 1

## 2023-04-17 MED ORDER — SULFAMETHOXAZOLE-TRIMETHOPRIM 400-80 MG PO TABS
1.0000 | ORAL_TABLET | Freq: Every day | ORAL | Status: DC
  Administered 2023-04-17 – 2023-04-20 (×4): 1 via ORAL
  Filled 2023-04-17 (×4): qty 1

## 2023-04-17 MED ORDER — PANTOPRAZOLE SODIUM 40 MG PO TBEC
40.0000 mg | DELAYED_RELEASE_TABLET | Freq: Every day | ORAL | Status: DC
  Administered 2023-04-17 – 2023-04-20 (×4): 40 mg via ORAL
  Filled 2023-04-17 (×4): qty 1

## 2023-04-17 MED ORDER — TRAZODONE HCL 50 MG PO TABS
50.0000 mg | ORAL_TABLET | Freq: Every evening | ORAL | Status: DC | PRN
  Administered 2023-04-17 – 2023-04-19 (×3): 50 mg via ORAL
  Filled 2023-04-17 (×3): qty 1

## 2023-04-17 MED ORDER — VENLAFAXINE HCL ER 75 MG PO CP24
150.0000 mg | ORAL_CAPSULE | Freq: Every day | ORAL | Status: DC
  Administered 2023-04-17 – 2023-04-20 (×4): 150 mg via ORAL
  Filled 2023-04-17 (×4): qty 2

## 2023-04-17 NOTE — Evaluation (Signed)
 Occupational Therapy Evaluation Patient Details Name: Tonya Bartlett MRN: [id number] DOB: Mar 24, 1945 Today's Date: 04/17/2023   History of Present Illness   Pt is a 78 y.o. female admitted 2/26 following a fall at home. She sustained head laceration, and compression fxs (T2, T6, L1). Head CT negative. PMH: HTN, hyperlipidemia, obesity, IBS, melanoma, breast ca, and a fib on Eliquis, GERD, depression, ADD, ORIF R ankle (07/2020), THA, vertigo (vestibular PT Jan 2025)     Clinical Impressions PTA, pt lived alone and was independent within her home. Upon eval, pt endorses several falls, decreased strength, balance, foot clearance during gait, and strength. Pt needing up to min A for bed mobility and transfers as well as up to mod-max A for LB ADL. Pt unable to tolerate challenge to balance in standing reliant on RW statically. Patient will benefit from continued inpatient follow up therapy, <3 hours/day      If plan is discharge home, recommend the following:   A little help with walking and/or transfers;A little help with bathing/dressing/bathroom;Assistance with cooking/housework;Assist for transportation;Help with stairs or ramp for entrance     Functional Status Assessment   Patient has had a recent decline in their functional status and demonstrates the ability to make significant improvements in function in a reasonable and predictable amount of time.     Equipment Recommendations   Other (comment) (defer)     Recommendations for Other Services         Precautions/Restrictions   Precautions Precautions: Fall;Back Recall of Precautions/Restrictions: Intact Precaution/Restrictions Comments: history of multiple falls, educated on back precautions Restrictions Weight Bearing Restrictions Per Provider Order: No     Mobility Bed Mobility Overal bed mobility: Needs Assistance Bed Mobility: Rolling, Sidelying to Sit Rolling: Modified independent (Device/Increase  time) Sidelying to sit: Min assist, Used rails       General bed mobility comments: assist to elevate trunk, pt demo good technique with logroll    Transfers Overall transfer level: Needs assistance Equipment used: Rolling walker (2 wheels) Transfers: Sit to/from Stand Sit to Stand: From elevated surface, Min assist           General transfer comment: assist to power up, cues for safety/sequencing for stand > sit      Balance Overall balance assessment: Needs assistance Sitting-balance support: No upper extremity supported, Feet supported Sitting balance-Leahy Scale: Good     Standing balance support: Bilateral upper extremity supported, During functional activity, Reliant on assistive device for balance Standing balance-Leahy Scale: Poor                             ADL either performed or assessed with clinical judgement   ADL Overall ADL's : Needs assistance/impaired Eating/Feeding: Set up;Sitting   Grooming: Set up;Sitting   Upper Body Bathing: Set up;Sitting   Lower Body Bathing: Moderate assistance;Sit to/from stand   Upper Body Dressing : Set up;Sitting   Lower Body Dressing: Moderate assistance;Sit to/from stand   Toilet Transfer: Minimal assistance;Ambulation;Rolling walker (2 wheels)           Functional mobility during ADLs: Minimal assistance;Rolling walker (2 wheels)       Vision Patient Visual Report: No change from baseline       Perception         Praxis         Pertinent Vitals/Pain Pain Assessment Pain Assessment: Faces Faces Pain Scale: Hurts little more Pain Location: back Pain Descriptors / Indicators:  Discomfort, Grimacing, Guarding Pain Intervention(s): Limited activity within patient's tolerance, Monitored during session     Extremity/Trunk Assessment Upper Extremity Assessment Upper Extremity Assessment: Generalized weakness   Lower Extremity Assessment Lower Extremity Assessment: Defer to PT  evaluation LLE Deficits / Details: weak dorsiflexion, decreased foot clearance   Cervical / Trunk Assessment Cervical / Trunk Assessment: Other exceptions (spinal fxs)   Communication Communication Communication: No apparent difficulties   Cognition Arousal: Alert Behavior During Therapy: WFL for tasks assessed/performed Cognition: No apparent impairments                               Following commands: Intact       Cueing  General Comments          Exercises     Shoulder Instructions      Home Living Family/patient expects to be discharged to:: Private residence Living Arrangements: Alone Available Help at Discharge: Family;Available PRN/intermittently Type of Home: House Home Access: Stairs to enter [employer] of Steps: 7 Entrance Stairs-Rails: Left Home Layout: One level     Bathroom Shower/Tub: Producer, television/film/video: Standard     Home Equipment: Agricultural consultant (2 wheels);Cane - single point          Prior Functioning/Environment Prior Level of Function : Independent/Modified Independent;Driving;History of Falls (last six months)             Mobility Comments: cane for amb ADLs Comments: mod I for BADL    OT Problem List: Decreased strength;Decreased activity tolerance;Impaired balance (sitting and/or standing);Decreased knowledge of use of DME or AE;Pain   OT Treatment/Interventions: Self-care/ADL training;Therapeutic exercise;DME and/or AE instruction;Patient/family education;Balance training;Therapeutic activities      OT Goals(Current goals can be found in the care plan section)   Acute Rehab OT Goals Patient Stated Goal: get better/no falls OT Goal Formulation: With patient Time For Goal Achievement: 05/01/23 Potential to Achieve Goals: Good   OT Frequency:  Min 1X/week    Co-evaluation              AM-PAC OT "6 Clicks" Daily Activity     Outcome Measure Help from another person eating  meals?: None Help from another person taking care of personal grooming?: A Little Help from another person toileting, which includes using toliet, bedpan, or urinal?: A Little Help from another person bathing (including washing, rinsing, drying)?: A Little Help from another person to put on and taking off regular upper body clothing?: A Little Help from another person to put on and taking off regular lower body clothing?: A Lot 6 Click Score: 18   End of Session Equipment Utilized During Treatment: Gait belt;Rolling walker (2 wheels) Nurse Communication: Mobility status  Activity Tolerance: Patient tolerated treatment well Patient left: with call bell/phone within reach;in chair;with chair alarm set;with family/visitor present  OT Visit Diagnosis: Unsteadiness on feet (R26.81);Muscle weakness (generalized) (M62.81);History of falling (Z91.81);Pain Pain - part of body:  (back)                Time: 1610-9604 OT Time Calculation (min): 23 min Charges:  OT General Charges $OT Visit: 1 Visit OT Evaluation $OT Eval Moderate Complexity: 1 Mod  Tyler Deis, OTR/L [facility] Acute Rehabilitation Office: [phone]   Tonya Bartlett 04/17/2023, 4:07 PM

## 2023-04-17 NOTE — Progress Notes (Signed)
 PROGRESS NOTE    Tonya Bartlett  ZOX:[id number] DOB: May 29, 1945 DOA: 04/14/2023 PCP: Shirline Frees, NP  Outpatient Specialists:     Brief Narrative:  Patient is a 78 year old female, with past medical history significant for hypertension, hyperlipidemia, obesity, IBS, melanoma, breast cancer, and atrial fibrillation on Eliquis.  Patient presents with recurrent mechanical falls, T2, T6 and L1 compression fractures.  Patient reports significant back pain.  04/16/2023: Patient continues to report significant back pain. 04/17/2023: Pain is better controlled.  PT OT input is highly appreciated.  Add lidocaine patch.   Assessment & Plan:   Principal Problem:   Vertebral compression fracture (HCC) Active Problems:   Closed fracture of cervical vertebral body (HCC)   Closed compression fracture of body of L1 vertebra (HCC)  Mechanical fall, T2, T6 and L1 compression fractures As per prior documentation: Trauma surgery called at intake, appreciate insight recommendations - No current indication for intervention -PT OT consulted -Defer to surgical team for further recommendations on bracing or imaging -Optimize pain control (start oxycodone ER 10 Mg p.o. twice daily).  Continue on the pain regimen.   04/17/2023: Pain is better controlled.  Continue current regimen.  Add lidocaine patch.  Continue to assess daily.  PT OT.  Hypertension: -Continue to optimize. 04/17/2023: Blood pressure is reasonably controlled.  Hyperlipidemia:  Atrial fibrillation: -Heart rate ranges from 98 to 109 bpm.   -Eliquis is on hold.  DVT prophylaxis: Eliquis is on hold.  Consider subcutaneous Lovenox for now. Code Status: Patient is full code. Family Communication: Daughter. Disposition Plan: Patient remains inpatient.   Consultants:  No documented consult note.  Procedures:  None.  Antimicrobials:  None.   Subjective: Back pain is better controlled.  Objective: Vitals:   04/17/23 0010  04/17/23 0801 04/17/23 1121 04/17/23 1513  BP: 135/65 (!) 147/77 129/78 (!) 137/55  Pulse: 97 98 (!) 102 (!) 109  Resp: 18 16 19 17   Temp: 100 F (37.8 C) 99 F (37.2 C) 98.7 F (37.1 C) 98.5 F (36.9 C)  TempSrc: Oral Oral Oral Oral  SpO2:  93% 92% 94%  Weight:      Height:        Intake/Output Summary (Last 24 hours) at 04/17/2023 1710 Last data filed at 04/17/2023 1400 Gross per 24 hour  Intake 960 ml  Output 1100 ml  Net -140 ml   Filed Weights   04/14/23 1947  Weight: 104.3 kg    Examination:  General exam: Appears calm and comfortable  Respiratory system: Clear to auscultation.  Cardiovascular system: S1 & S2 heard Gastrointestinal system: Abdomen is soft and nontender. Central nervous system: Awake and alert.  Power left lower extremity 2+ to 3/5 and right lower extremity 3+ to 4/5. Extremities: No leg edema.  Data Reviewed: I have personally reviewed following labs and imaging studies  CBC: Recent Labs  Lab 04/14/23 2248 04/15/23 0024 04/16/23 0552 04/17/23 0554  WBC 10.7*  --  7.3 7.3  NEUTROABS 9.2*  --   --  4.8  HGB 10.4* 10.2* 8.1* 7.5*  HCT 30.6* 29.8* 24.6* 22.2*  MCV 100.3*  --  101.2* 100.9*  PLT 192  --  179 156   Basic Metabolic Panel: Recent Labs  Lab 04/14/23 2248 04/16/23 0552 04/17/23 0554  NA 139 136 135  K 3.5 3.6 3.7  CL 107 103 100  CO2 23 30 24   GLUCOSE 148* 111* 105*  BUN 12 8 6*  CREATININE 0.74 0.63 0.62  CALCIUM 9.0 8.4* 8.3*  MG  --   --  1.9  PHOS  --   --  3.7   GFR: Estimated Creatinine Clearance: 70.7 mL/min (by C-G formula based on SCr of 0.62 mg/dL). Liver Function Tests: Recent Labs  Lab 04/17/23 0554  ALBUMIN 2.9*   No results for input(s): "LIPASE", "AMYLASE" in the last 168 hours. No results for input(s): "AMMONIA" in the last 168 hours. Coagulation Profile: No results for input(s): "INR", "PROTIME" in the last 168 hours. Cardiac Enzymes: No results for input(s): "CKTOTAL", "CKMB", "CKMBINDEX",  "TROPONINI" in the last 168 hours. BNP (last 3 results) Recent Labs    01/20/23 1059  PROBNP 219   HbA1C: No results for input(s): "HGBA1C" in the last 72 hours. CBG: No results for input(s): "GLUCAP" in the last 168 hours. Lipid Profile: No results for input(s): "CHOL", "HDL", "LDLCALC", "TRIG", "CHOLHDL", "LDLDIRECT" in the last 72 hours. Thyroid Function Tests: No results for input(s): "TSH", "T4TOTAL", "FREET4", "T3FREE", "THYROIDAB" in the last 72 hours. Anemia Panel: No results for input(s): "VITAMINB12", "FOLATE", "FERRITIN", "TIBC", "IRON", "RETICCTPCT" in the last 72 hours. Urine analysis:    Component Value Date/Time   COLORURINE YELLOW 10/29/2014 0125   APPEARANCEUR CLEAR 10/29/2014 0125   LABSPEC 1.012 10/29/2014 0125   PHURINE 6.5 10/29/2014 0125   GLUCOSEU NEGATIVE 10/29/2014 0125   HGBUR TRACE (A) 10/29/2014 0125   HGBUR trace-lysed 10/29/2008 1057   BILIRUBINUR N 07/20/2017 1134   KETONESUR 40 (A) 10/29/2014 0125   PROTEINUR Positive (A) 07/20/2017 1134   PROTEINUR NEGATIVE 10/29/2014 0125   UROBILINOGEN 0.2 07/20/2017 1134   UROBILINOGEN 1.0 10/29/2014 0125   NITRITE N 07/20/2017 1134   NITRITE NEGATIVE 10/29/2014 0125   LEUKOCYTESUR Negative 07/20/2017 1134   Sepsis Labs: @LABRCNTIP (procalcitonin:4,lacticidven:4)  )No results found for this or any previous visit (from the past 240 hours).       Radiology Studies: No results found.       Scheduled Meds:  atorvastatin  40 mg Oral Daily   buPROPion ER  100 mg Oral QHS   enoxaparin (LOVENOX) injection  40 mg Subcutaneous Q24H   losartan  100 mg Oral Daily   metoprolol tartrate  12.5 mg Oral BID   oxyCODONE  10 mg Oral Q12H   pantoprazole  40 mg Oral Daily   sulfamethoxazole-trimethoprim  1 tablet Oral Daily   venlafaxine XR  150 mg Oral Q breakfast   Continuous Infusions:   LOS: 2 days    Time spent: 35 minutes.    Berton Mount, MD  [facility] Pager #: [phone] 7PM-7AM contact night coverage as above

## 2023-04-17 NOTE — Evaluation (Signed)
 Physical Therapy Evaluation Patient Details Name: Tonya Bartlett MRN: [id number] DOB: [birth year] Today's Date: 04/17/2023  History of Present Illness  Pt is a 78 y.o. female admitted 2/26 following a fall at home. She sustained head laceration, and compression fxs (T2, T6, L1). Head CT negative. PMH: HTN, hyperlipidemia, obesity, IBS, melanoma, breast ca, and a fib on Eliquis, GERD, depression, ADD, ORIF R ankle (07/2020), THA, vertigo (vestibular PT Jan 2025)   Clinical Impression  Pt admitted with above diagnosis. PTA pt lived alone, mod I with cane (but multiple recent falls). Pt currently with functional limitations due to the deficits listed below (see PT Problem List). On eval, pt required min assist bed mobility, min assist sit to stand, and min assist amb 25' with RW. Gait distance limited by LE weakness. Good sitting balance and poor standing balance. Pt will benefit from acute skilled PT to increase their independence and safety with mobility to allow discharge.           If plan is discharge home, recommend the following: A little help with walking and/or transfers;A little help with bathing/dressing/bathroom;Assistance with cooking/housework;Assist for transportation;Help with stairs or ramp for entrance   Can travel by private vehicle   Yes    Equipment Recommendations None recommended by PT  Recommendations for Other Services       Functional Status Assessment Patient has had a recent decline in their functional status and demonstrates the ability to make significant improvements in function in a reasonable and predictable amount of time.     Precautions / Restrictions Precautions Precautions: Fall;Back Recall of Precautions/Restrictions: Intact Precaution/Restrictions Comments: history of multiple falls, educated on back precautions      Mobility  Bed Mobility Overal bed mobility: Needs Assistance Bed Mobility: Rolling, Sidelying to Sit Rolling: Modified  independent (Device/Increase time) Sidelying to sit: Min assist, Used rails       General bed mobility comments: assist to elevate trunk, pt demo good technique with logroll    Transfers Overall transfer level: Needs assistance Equipment used: Rolling walker (2 wheels) Transfers: Sit to/from Stand Sit to Stand: From elevated surface, Min assist           General transfer comment: assist to power up, cues for safety/sequencing for stand > sit    Ambulation/Gait Ambulation/Gait assistance: Min assist Gait Distance (Feet): 25 Feet Assistive device: Rolling walker (2 wheels) Gait Pattern/deviations: Step-through pattern, Decreased stride length Gait velocity: decreased Gait velocity interpretation: <1.31 ft/sec, indicative of household ambulator   General Gait Details: decreased foot clearance on L. Distance limited by LE weakness.  Stairs            Wheelchair Mobility     Tilt Bed    Modified Rankin (Stroke Patients Only)       Balance Overall balance assessment: Needs assistance Sitting-balance support: No upper extremity supported, Feet supported Sitting balance-Leahy Scale: Good     Standing balance support: Bilateral upper extremity supported, During functional activity, Reliant on assistive device for balance Standing balance-Leahy Scale: Poor                               Pertinent Vitals/Pain Pain Assessment Pain Assessment: Faces Faces Pain Scale: Hurts little more Pain Location: back Pain Descriptors / Indicators: Discomfort, Grimacing, Guarding Pain Intervention(s): Monitored during session, Limited activity within patient's tolerance, Repositioned    Home Living Family/patient expects to be discharged to:: Private residence Living Arrangements: Alone  Available Help at Discharge: Family;Available PRN/intermittently Type of Home: House Home Access: Stairs to enter Entrance Stairs-Rails: Left Entrance Stairs-Number of Steps:  7   Home Layout: One level Home Equipment: Agricultural consultant (2 wheels);Cane - single point      Prior Function Prior Level of Function : Independent/Modified Independent;Driving;History of Falls (last six months)             Mobility Comments: cane for amb       Extremity/Trunk Assessment   Upper Extremity Assessment Upper Extremity Assessment: Defer to OT evaluation    Lower Extremity Assessment Lower Extremity Assessment: Generalized weakness;LLE deficits/detail LLE Deficits / Details: weak dorsiflexion, decreased foot clearance       Communication   Communication Communication: No apparent difficulties    Cognition Arousal: Alert Behavior During Therapy: WFL for tasks assessed/performed   PT - Cognitive impairments: No apparent impairments                         Following commands: Intact       Cueing       General Comments      Exercises     Assessment/Plan    PT Assessment Patient needs continued PT services  PT Problem List Decreased strength;Decreased balance;Pain;Decreased mobility;Decreased activity tolerance       PT Treatment Interventions Functional mobility training;Balance training;Patient/family education;Gait training;Therapeutic activities;Therapeutic exercise;Stair training    PT Goals (Current goals can be found in the Care Plan section)  Acute Rehab PT Goals Patient Stated Goal: rehab at [facility] then home PT Goal Formulation: With patient Time For Goal Achievement: 05/01/23 Potential to Achieve Goals: Good    Frequency Min 1X/week     Co-evaluation               AM-PAC PT "6 Clicks" Mobility  Outcome Measure Help needed turning from your back to your side while in a flat bed without using bedrails?: A Little Help needed moving from lying on your back to sitting on the side of a flat bed without using bedrails?: A Little Help needed moving to and from a bed to a chair (including a wheelchair)?: A  Little Help needed standing up from a chair using your arms (e.g., wheelchair or bedside chair)?: A Little Help needed to walk in hospital room?: A Little Help needed climbing 3-5 steps with a railing? : Total 6 Click Score: 16    End of Session Equipment Utilized During Treatment: Gait belt Activity Tolerance: Patient tolerated treatment well Patient left: in chair;with call bell/phone within reach;with chair alarm set;with family/visitor present Nurse Communication: Mobility status PT Visit Diagnosis: Difficulty in walking, not elsewhere classified (R26.2)    Time: 4098-1191 PT Time Calculation (min) (ACUTE ONLY): 26 min   Charges:   PT Evaluation $PT Eval Moderate Complexity: 1 Mod   PT General Charges $$ ACUTE PT VISIT: 1 Visit         Ferd Glassing., PT  Office # [phone]   Ilda Foil 04/17/2023, 2:26 PM

## 2023-04-18 DIAGNOSIS — S32010D Wedge compression fracture of first lumbar vertebra, subsequent encounter for fracture with routine healing: Secondary | ICD-10-CM | POA: Diagnosis not present

## 2023-04-18 MED ORDER — POTASSIUM CHLORIDE CRYS ER 20 MEQ PO TBCR
40.0000 meq | EXTENDED_RELEASE_TABLET | Freq: Once | ORAL | Status: AC
  Administered 2023-04-18: 40 meq via ORAL
  Filled 2023-04-18: qty 2

## 2023-04-18 MED ORDER — OXYCODONE HCL ER 15 MG PO T12A
15.0000 mg | EXTENDED_RELEASE_TABLET | Freq: Two times a day (BID) | ORAL | Status: DC
  Administered 2023-04-18 – 2023-04-20 (×3): 15 mg via ORAL
  Filled 2023-04-18 (×4): qty 1

## 2023-04-18 MED ORDER — MAGNESIUM SULFATE 2 GM/50ML IV SOLN
2.0000 g | Freq: Once | INTRAVENOUS | Status: AC
  Administered 2023-04-18: 2 g via INTRAVENOUS
  Filled 2023-04-18: qty 50

## 2023-04-18 NOTE — [place] FL2 (Signed)
 [place] MEDICAID FL2 LEVEL OF CARE FORM     IDENTIFICATION  Patient Name: Tonya Bartlett Birthdate: Jun 26, 1945 Sex: female Admission Date (Current Location): 04/14/2023  [facility] and [place] Number:  Producer, television/film/video and Address:  [facility], [address], [place], [place] [address]      Provider Number: [id number]  Attending Physician Name and Address:  Barnetta Chapel, MD  Relative Name and Phone Number:       Current Level of Care: Hospital Recommended Level of Care: Skilled Nursing Facility Prior Approval Number:    Date Approved/Denied:   PASRR Number: [id number]  Discharge Plan: SNF    Current Diagnoses: Patient Active Problem List   Diagnosis Date Noted   Closed fracture of cervical vertebral body (HCC) 04/15/2023   Closed compression fracture of body of L1 vertebra (HCC) 04/15/2023   Vertebral compression fracture (HCC) 04/14/2023   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   Hypercoagulable state due to paroxysmal atrial fibrillation (HCC) 12/10/2022   BMI 33.0-33.9,adult 07/22/2022   BMI 32.0-32.9,adult 04/09/2022   Obesity, Beginning BMI 36.94 04/09/2022   B12 deficiency 01/29/2022   Pain of toe of right foot 12/25/2021   Pre-diabetes 11/05/2021   Essential hypertension 11/05/2021   Left knee pain 11/05/2021   Coronary artery calcification 08/07/2021   Vitamin D deficiency 07/29/2021   Insulin resistance 07/17/2021   Dysphagia 03/21/2021   Gastroesophageal reflux disease 03/21/2021   History of colon polyps 03/21/2021   Genetic testing 01/31/2021   Aortic atherosclerosis (HCC) 01/17/2021   Family history of melanoma 01/16/2021   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   PAF (paroxysmal atrial fibrillation) (HCC) 10/22/2020   Family history of heart disease 10/22/2020   Trimalleolar fracture 07/31/2020   Right hip OA 08/01/2019   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   Insomnia  02/03/2019   Onychomycosis 11/28/2018   Obese 05/05/2017   S/P left THA, 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   MCI (mild cognitive impairment) 09/19/2015   Memory deficit 09/19/2015   SIRS (systemic inflammatory response syndrome) (HCC) 10/29/2014   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   Depression 11/16/2013   ADD (attention deficit disorder) 11/17/2011   Essential hypertension, benign 10/15/2009   SEBORRHEIC KERATOSIS, INFLAMED 10/17/2008   CARPAL TUNNEL SYNDROME, LEFT 06/22/2008   Allergic rhinitis 08/30/2007   Hyperlipidemia 11/11/2006   Migraine 11/11/2006   Osteoarthritis 11/11/2006    Orientation RESPIRATION BLADDER Height & Weight     Self, Time, Situation, Place  Normal Continent Weight: 230 lb (104.3 kg) Height:  5\' 6"  (167.6 cm)  BEHAVIORAL SYMPTOMS/MOOD NEUROLOGICAL BOWEL NUTRITION STATUS      Continent    AMBULATORY STATUS COMMUNICATION OF NEEDS Skin   Extensive Assist Verbally Normal                       Personal Care Assistance Level of Assistance  Bathing, Feeding, Dressing Bathing Assistance: Limited assistance Feeding assistance: Limited assistance Dressing Assistance: Limited assistance     Functional Limitations Info  Sight, Hearing, Speech Sight Info: Adequate Hearing Info: Adequate Speech Info: Adequate    SPECIAL CARE FACTORS FREQUENCY  PT (By licensed PT), OT (By licensed OT)                    Contractures Contractures Info: Not present    Additional Factors Info  Code Status Code Status Info: FULL CODE  Current Medications (04/18/2023):  This is the current hospital active medication list Current Facility-Administered Medications  Medication Dose Route Frequency Provider Last Rate Last Admin   acetaminophen (TYLENOL) tablet 650 mg  650 mg Oral Q4H PRN Berton Mount I, MD   650 mg at 04/18/23 1610   Or   acetaminophen (TYLENOL) suppository 650 mg  650 mg Rectal Q6H PRN Berton Mount I, MD        atorvastatin (LIPITOR) tablet 40 mg  40 mg Oral Daily Berton Mount I, MD   40 mg at 04/17/23 0900   buPROPion ER (WELLBUTRIN SR) 12 hr tablet 100 mg  100 mg Oral QHS Berton Mount I, MD   100 mg at 04/17/23 2100   cyclobenzaprine (FLEXERIL) tablet 5 mg  5 mg Oral TID PRN Maretta Bees, MD   5 mg at 04/18/23 0059   enoxaparin (LOVENOX) injection 40 mg  40 mg Subcutaneous Q24H Berton Mount I, MD   40 mg at 04/17/23 2100   HYDROmorphone (DILAUDID) injection 0.5-1 mg  0.5-1 mg Intravenous Q2H PRN Azucena Fallen, MD   1 mg at 04/17/23 0901   losartan (COZAAR) tablet 100 mg  100 mg Oral Daily Berton Mount I, MD   100 mg at 04/17/23 0900   metoprolol tartrate (LOPRESSOR) tablet 12.5 mg  12.5 mg Oral BID Maretta Bees, MD   12.5 mg at 04/17/23 2100   ondansetron (ZOFRAN) tablet 4 mg  4 mg Oral Q6H PRN Azucena Fallen, MD       Or   ondansetron [facility]) injection 4 mg  4 mg Intravenous Q6H PRN Azucena Fallen, MD       oxyCODONE (Oxy IR/ROXICODONE) immediate release tablet 2.5-5 mg  2.5-5 mg Oral Q4H PRN Berton Mount I, MD   5 mg at 04/18/23 0517   oxyCODONE (OXYCONTIN) 12 hr tablet 10 mg  10 mg Oral Q12H Berton Mount I, MD   10 mg at 04/17/23 2100   pantoprazole (PROTONIX) EC tablet 40 mg  40 mg Oral Daily Berton Mount I, MD   40 mg at 04/17/23 1643   polyethylene glycol (MIRALAX / GLYCOLAX) packet 17 g  17 g Oral Daily PRN Azucena Fallen, MD   17 g at 04/17/23 9604   senna-docusate (Senokot-S) tablet 1 tablet  1 tablet Oral QHS PRN Azucena Fallen, MD       sulfamethoxazole-trimethoprim (BACTRIM) 400-80 MG per tablet 1 tablet  1 tablet Oral Daily Berton Mount I, MD   1 tablet at 04/17/23 1520   traZODone (DESYREL) tablet 50 mg  50 mg Oral QHS PRN Berton Mount I, MD   50 mg at 04/17/23 2059   venlafaxine XR (EFFEXOR-XR) 24 hr capsule 150 mg  150 mg Oral Q breakfast Berton Mount I, MD   150 mg at 04/17/23 1643      Discharge Medications: Please see discharge summary for a list of discharge medications.  Relevant Imaging Results:  Relevant Lab Results:   Additional Information SSN: [id number]  Deatra Robinson, [place]

## 2023-04-18 NOTE — Progress Notes (Signed)
 PROGRESS NOTE    Tonya Bartlett  VZD:[id number] DOB: 11-12-1945 DOA: 04/14/2023 PCP: Shirline Frees, NP  Outpatient Specialists:     Brief Narrative:  Patient is a 78 year old female, with past medical history significant for hypertension, hyperlipidemia, obesity, IBS, melanoma, breast cancer, and atrial fibrillation on Eliquis.  Patient presents with recurrent mechanical falls, T2, T6 and L1 compression fractures.  Patient reports significant back pain.  04/18/2023: Patient seen.  Continues to report back pain on certain movements.  Will increase oxycodone XR from 10 Mg twice daily to 15 Mg twice daily.  Pursue disposition (skilled nursing facility/short-term rehab).   Assessment & Plan:   Principal Problem:   Vertebral compression fracture (HCC) Active Problems:   Closed fracture of cervical vertebral body (HCC)   Closed compression fracture of body of L1 vertebra (HCC)  Mechanical fall, T2, T6 and L1 compression fractures As per prior documentation: Trauma surgery called at intake, appreciate insight recommendations - No current indication for intervention -PT OT consulted 04/18/2023: Increase oxycodone ER from 10 Mg p.o. twice daily to 15 Mg p.o. twice daily.  Pursue disposition.  Hypertension: -Continue to optimize. 04/18/2023: Blood pressure is reasonably controlled.  Hyperlipidemia:  Atrial fibrillation: -Heart rate ranges from 99 to 109 bpm.   -Eliquis is on hold.  DVT prophylaxis: Eliquis is on hold.  Subcutaneous Lovenox 40 Mg daily. Code Status: Patient is full code. Family Communication: Disposition Plan: Patient remains inpatient.  Pursue disposition (skilled nursing facility for short-term rehab).   Consultants:  No documented consult note.  Procedures:  None.  Antimicrobials:  Bactrim chronically for UTI prophylaxis.   Subjective: Back pain is better controlled.  Objective: Vitals:   04/18/23 0745 04/18/23 1148 04/18/23 1238 04/18/23 1538  BP: (!)  129/57  (!) 109/50 (!) 141/59  Pulse: 100 99  (!) 109  Resp: 16 15  15   Temp: 98.4 F (36.9 C) 98.5 F (36.9 C)  98.1 F (36.7 C)  TempSrc: Oral Oral  Oral  SpO2: 99% 96%  95%  Weight:      Height:        Intake/Output Summary (Last 24 hours) at 04/18/2023 1840 Last data filed at 04/18/2023 1150 Gross per 24 hour  Intake --  Output 750 ml  Net -750 ml   Filed Weights   04/14/23 1947  Weight: 104.3 kg    Examination:  General exam: Appears calm and comfortable  Respiratory system: Clear to auscultation.  Cardiovascular system: S1 & S2 heard Gastrointestinal system: Abdomen is soft and nontender. Central nervous system: Awake and alert.  Power left lower extremity 2+ to 3/5 and right lower extremity 3+ to 4/5. Extremities: No leg edema.  Data Reviewed: I have personally reviewed following labs and imaging studies  CBC: Recent Labs  Lab 04/14/23 2248 04/15/23 0024 04/16/23 0552 04/17/23 0554  WBC 10.7*  --  7.3 7.3  NEUTROABS 9.2*  --   --  4.8  HGB 10.4* 10.2* 8.1* 7.5*  HCT 30.6* 29.8* 24.6* 22.2*  MCV 100.3*  --  101.2* 100.9*  PLT 192  --  179 156   Basic Metabolic Panel: Recent Labs  Lab 04/14/23 2248 04/16/23 0552 04/17/23 0554  NA 139 136 135  K 3.5 3.6 3.7  CL 107 103 100  CO2 23 30 24   GLUCOSE 148* 111* 105*  BUN 12 8 6*  CREATININE 0.74 0.63 0.62  CALCIUM 9.0 8.4* 8.3*  MG  --   --  1.9  PHOS  --   --  3.7   GFR: Estimated Creatinine Clearance: 70.7 mL/min (by C-G formula based on SCr of 0.62 mg/dL). Liver Function Tests: Recent Labs  Lab 04/17/23 0554  ALBUMIN 2.9*   No results for input(s): "LIPASE", "AMYLASE" in the last 168 hours. No results for input(s): "AMMONIA" in the last 168 hours. Coagulation Profile: No results for input(s): "INR", "PROTIME" in the last 168 hours. Cardiac Enzymes: No results for input(s): "CKTOTAL", "CKMB", "CKMBINDEX", "TROPONINI" in the last 168 hours. BNP (last 3 results) Recent Labs     01/20/23 1059  PROBNP 219   HbA1C: No results for input(s): "HGBA1C" in the last 72 hours. CBG: No results for input(s): "GLUCAP" in the last 168 hours. Lipid Profile: No results for input(s): "CHOL", "HDL", "LDLCALC", "TRIG", "CHOLHDL", "LDLDIRECT" in the last 72 hours. Thyroid Function Tests: No results for input(s): "TSH", "T4TOTAL", "FREET4", "T3FREE", "THYROIDAB" in the last 72 hours. Anemia Panel: No results for input(s): "VITAMINB12", "FOLATE", "FERRITIN", "TIBC", "IRON", "RETICCTPCT" in the last 72 hours. Urine analysis:    Component Value Date/Time   COLORURINE YELLOW 10/29/2014 0125   APPEARANCEUR CLEAR 10/29/2014 0125   LABSPEC 1.012 10/29/2014 0125   PHURINE 6.5 10/29/2014 0125   GLUCOSEU NEGATIVE 10/29/2014 0125   HGBUR TRACE (A) 10/29/2014 0125   HGBUR trace-lysed 10/29/2008 1057   BILIRUBINUR N 07/20/2017 1134   KETONESUR 40 (A) 10/29/2014 0125   PROTEINUR Positive (A) 07/20/2017 1134   PROTEINUR NEGATIVE 10/29/2014 0125   UROBILINOGEN 0.2 07/20/2017 1134   UROBILINOGEN 1.0 10/29/2014 0125   NITRITE N 07/20/2017 1134   NITRITE NEGATIVE 10/29/2014 0125   LEUKOCYTESUR Negative 07/20/2017 1134   Sepsis Labs: @LABRCNTIP (procalcitonin:4,lacticidven:4)  )No results found for this or any previous visit (from the past 240 hours).       Radiology Studies: No results found.       Scheduled Meds:  atorvastatin  40 mg Oral Daily   buPROPion ER  100 mg Oral QHS   enoxaparin (LOVENOX) injection  40 mg Subcutaneous Q24H   losartan  100 mg Oral Daily   metoprolol tartrate  12.5 mg Oral BID   oxyCODONE  10 mg Oral Q12H   pantoprazole  40 mg Oral Daily   sulfamethoxazole-trimethoprim  1 tablet Oral Daily   venlafaxine XR  150 mg Oral Q breakfast   Continuous Infusions:   LOS: 3 days    Time spent: 35 minutes.    Berton Mount, MD  [facility] Pager #: [phone] 7PM-7AM contact night coverage as above

## 2023-04-18 NOTE — TOC Initial Note (Signed)
 Transition of Care [facility]) - Initial/Assessment Note    Patient Details  Name: Tonya Bartlett MRN: [id number] Date of Birth: 08/27/1945  Transition of Care [facility]) CM/SW Contact:    Deatra Robinson, [place] Phone Number: 04/18/2023, 9:16 AM  Clinical Narrative: SW consult for SNF placement. Pt aware of PT/OT rec for SNF and reports agreeable, requesting [facility]. Will begin SNF search and f/u with offers as available.   Dellie Burns, MSW, LCSW [phone] (coverage)                    Expected Discharge Plan: Skilled Nursing Facility Barriers to Discharge: Continued Medical Work up, SNF Pending bed offer   Patient Goals and CMS Choice   CMS Medicare.gov Compare Post Acute Care list provided to:: Patient Choice offered to / list presented to : Patient [place] ownership interest in [facility].provided to:: Patient    Expected Discharge Plan and Services       Living arrangements for the past 2 months: Single Family Home                                      Prior Living Arrangements/Services Living arrangements for the past 2 months: Single Family Home Lives with:: Self Patient language and need for interpreter reviewed:: Yes        Need for Family Participation in Patient Care: Yes (Comment) Care giver support system in place?: No (comment) Current home services: DME Criminal Activity/Legal Involvement Pertinent to Current Situation/Hospitalization: No - Comment as needed  Activities of Daily Living      Permission Sought/Granted Permission sought to share information with : Oceanographer granted to share information with : Yes, Verbal Permission Granted              Emotional Assessment       Orientation: : Oriented to Self, Oriented to Place, Oriented to  Time, Oriented to Situation Alcohol / Substance Use: Not Applicable Psych Involvement: No (comment)  Admission diagnosis:  Vertebral compression  fracture (HCC) [M48.50XA] Hematoma of scalp, initial encounter [S00.03XA] Fall, initial encounter [W19.XXXA] Blunt head trauma, initial encounter [S09.8XXA] Closed wedge compression fracture of T6 vertebra, initial encounter (HCC) [S22.050A] Closed fracture of cervical vertebral body (HCC) [S12.9XXA] Closed fracture of first lumbar vertebra, unspecified fracture morphology, initial encounter (HCC) [S32.019A] Closed wedge compression fracture of T2 vertebra, initial encounter (HCC) [S22.020A] Closed compression fracture of body of L1 vertebra (HCC) [S32.010A] Patient Active Problem List   Diagnosis Date Noted   Closed fracture of cervical vertebral body (HCC) 04/15/2023   Closed compression fracture of body of L1 vertebra (HCC) 04/15/2023   Vertebral compression fracture (HCC) 04/14/2023   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   Hypercoagulable state due to paroxysmal atrial fibrillation (HCC) 12/10/2022   BMI 33.0-33.9,adult 07/22/2022   BMI 32.0-32.9,adult 04/09/2022   Obesity, Beginning BMI 36.94 04/09/2022   B12 deficiency 01/29/2022   Pain of toe of right foot 12/25/2021   Pre-diabetes 11/05/2021   Essential hypertension 11/05/2021   Left knee pain 11/05/2021   Coronary artery calcification 08/07/2021   Vitamin D deficiency 07/29/2021   Insulin resistance 07/17/2021   Dysphagia 03/21/2021   Gastroesophageal reflux disease 03/21/2021   History of colon polyps 03/21/2021   Genetic testing 01/31/2021   Aortic atherosclerosis (HCC) 01/17/2021   Family history of melanoma 01/16/2021   Ductal carcinoma in situ (DCIS) of left  breast 01/10/2021   PAF (paroxysmal atrial fibrillation) (HCC) 10/22/2020   Family history of heart disease 10/22/2020   Trimalleolar fracture 07/31/2020   Right hip OA 08/01/2019   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   Insomnia 02/03/2019   Onychomycosis 11/28/2018   Obese 05/05/2017   S/P left THA, 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   MCI (mild cognitive  impairment) 09/19/2015   Memory deficit 09/19/2015   SIRS (systemic inflammatory response syndrome) (HCC) 10/29/2014   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   Depression 11/16/2013   ADD (attention deficit disorder) 11/17/2011   Essential hypertension, benign 10/15/2009   SEBORRHEIC KERATOSIS, INFLAMED 10/17/2008   CARPAL TUNNEL SYNDROME, LEFT 06/22/2008   Allergic rhinitis 08/30/2007   Hyperlipidemia 11/11/2006   Migraine 11/11/2006   Osteoarthritis 11/11/2006   PCP:  Shirline Frees, NP Pharmacy:   [id number] - [place] - [address] [place] [address] Phone: [phone] Fax: [phone]     Social Drivers of Health (SDOH) Social History: SDOH Screenings   Food Insecurity: No Food Insecurity (04/15/2023)  Housing: High Risk (04/15/2023)  Transportation Needs: No Transportation Needs (04/15/2023)  Utilities: Not At Risk (04/15/2023)  Alcohol Screen: Low Risk  (04/18/2021)  Depression (PHQ2-9): Medium Risk (03/26/2023)  Financial Resource Strain: Low Risk  (04/18/2021)  Physical Activity: Inactive (04/18/2021)  Social Connections: Moderately Integrated (04/18/2021)  Stress: No Stress Concern Present (04/18/2021)  Tobacco Use: Medium Risk (04/14/2023)   SDOH Interventions:     Readmission Risk Interventions     No data to display

## 2023-04-18 NOTE — Plan of Care (Signed)

## 2023-04-19 DIAGNOSIS — S32010D Wedge compression fracture of first lumbar vertebra, subsequent encounter for fracture with routine healing: Secondary | ICD-10-CM | POA: Diagnosis not present

## 2023-04-19 NOTE — Care Management Important Message (Signed)
 Important Message  Patient Details  Name: Tonya Bartlett MRN: [id number] Date of Birth: 05/11/1945   Important Message Given:  Yes - Medicare IM     Sherilyn Banker 04/19/2023, 11:53 AM

## 2023-04-19 NOTE — Progress Notes (Signed)
 PROGRESS NOTE    Tonya Bartlett  NWG:[id number] DOB: 10-May-1945 DOA: 04/14/2023 PCP: Shirline Frees, NP  Outpatient Specialists:     Brief Narrative:  Patient is a 78 year old female, with past medical history significant for hypertension, hyperlipidemia, obesity, IBS, melanoma, breast cancer, and atrial fibrillation on Eliquis.  Patient presents with recurrent mechanical falls, T2, T6 and L1 compression fractures.  Patient reports significant back pain.  04/18/2023: Patient seen.  Continues to report back pain on certain movements.  Will increase oxycodone XR from 10 Mg twice daily to 15 Mg twice daily.  Pursue disposition (skilled nursing facility/short-term rehab).  04/19/2023 colon pain is better controlled.  Pursue disposition.  No new complaints.  Heart rate ranges from 99 to 115 bpm.  Restart metoprolol 12.5 Mg p.o. twice daily.  Continue to hold Eliquis.  Patient is still oozing from the back of the scalp.   Assessment & Plan:   Principal Problem:   Vertebral compression fracture (HCC) Active Problems:   Closed fracture of cervical vertebral body (HCC)   Closed compression fracture of body of L1 vertebra (HCC)  Mechanical fall, T2, T6 and L1 compression fractures As per prior documentation: Trauma surgery called at intake, appreciate insight recommendations - No current indication for intervention -PT OT consulted 04/18/2023: Increase oxycodone ER from 10 Mg p.o. twice daily to 15 Mg p.o. twice daily.  Pursue disposition. 04/19/2023: Patient seen.  Pain control is improving.  Pursue disposition.  Hypertension: -Continue to optimize. 04/19/2023: Blood pressure is reasonably controlled.  Hyperlipidemia:  Atrial fibrillation: -Heart rate ranges from 99 to 115 bpm.   -Eliquis is on hold. 04/19/2023: Restart metoprolol 12.5 Mg p.o. twice daily.  DVT prophylaxis: Eliquis is on hold.  Subcutaneous Lovenox 40 Mg daily. Code Status: Patient is full code. Family  Communication: Disposition Plan: Patient remains inpatient.  Pursue disposition (skilled nursing facility for short-term rehab).   Consultants:  No documented consult note.  Procedures:  None.  Antimicrobials:  Bactrim chronically for UTI prophylaxis.   Subjective: Back pain control is improving.   Objective: Vitals:   04/19/23 0745 04/19/23 1142 04/19/23 1519 04/19/23 1537  BP: (!) 117/45 96/66 103/71   Pulse:   (!) 115 (!) 110  Resp:   16   Temp: 98.8 F (37.1 C) 98 F (36.7 C) 98.4 F (36.9 C)   TempSrc: Oral Oral Oral   SpO2:   98%   Weight:      Height:        Intake/Output Summary (Last 24 hours) at 04/19/2023 1628 Last data filed at 04/19/2023 1526 Gross per 24 hour  Intake 120 ml  Output --  Net 120 ml   Filed Weights   04/14/23 1947  Weight: 104.3 kg    Examination:  General exam: Appears calm and comfortable  Respiratory system: Clear to auscultation.  Cardiovascular system: S1 & S2 heard Gastrointestinal system: Abdomen is soft and nontender. Central nervous system: Awake and alert.  Power left lower extremity 2+ to 3/5 and right lower extremity 3+ to 4/5. Extremities: No leg edema.  Data Reviewed: I have personally reviewed following labs and imaging studies  CBC: Recent Labs  Lab 04/14/23 2248 04/15/23 0024 04/16/23 0552 04/17/23 0554  WBC 10.7*  --  7.3 7.3  NEUTROABS 9.2*  --   --  4.8  HGB 10.4* 10.2* 8.1* 7.5*  HCT 30.6* 29.8* 24.6* 22.2*  MCV 100.3*  --  101.2* 100.9*  PLT 192  --  179 156   Basic  Metabolic Panel: Recent Labs  Lab 04/14/23 2248 04/16/23 0552 04/17/23 0554  NA 139 136 135  K 3.5 3.6 3.7  CL 107 103 100  CO2 23 30 24   GLUCOSE 148* 111* 105*  BUN 12 8 6*  CREATININE 0.74 0.63 0.62  CALCIUM 9.0 8.4* 8.3*  MG  --   --  1.9  PHOS  --   --  3.7   GFR: Estimated Creatinine Clearance: 70.7 mL/min (by C-G formula based on SCr of 0.62 mg/dL). Liver Function Tests: Recent Labs  Lab 04/17/23 0554  ALBUMIN  2.9*   No results for input(s): "LIPASE", "AMYLASE" in the last 168 hours. No results for input(s): "AMMONIA" in the last 168 hours. Coagulation Profile: No results for input(s): "INR", "PROTIME" in the last 168 hours. Cardiac Enzymes: No results for input(s): "CKTOTAL", "CKMB", "CKMBINDEX", "TROPONINI" in the last 168 hours. BNP (last 3 results) Recent Labs    01/20/23 1059  PROBNP 219   HbA1C: No results for input(s): "HGBA1C" in the last 72 hours. CBG: No results for input(s): "GLUCAP" in the last 168 hours. Lipid Profile: No results for input(s): "CHOL", "HDL", "LDLCALC", "TRIG", "CHOLHDL", "LDLDIRECT" in the last 72 hours. Thyroid Function Tests: No results for input(s): "TSH", "T4TOTAL", "FREET4", "T3FREE", "THYROIDAB" in the last 72 hours. Anemia Panel: No results for input(s): "VITAMINB12", "FOLATE", "FERRITIN", "TIBC", "IRON", "RETICCTPCT" in the last 72 hours. Urine analysis:    Component Value Date/Time   COLORURINE YELLOW 10/29/2014 0125   APPEARANCEUR CLEAR 10/29/2014 0125   LABSPEC 1.012 10/29/2014 0125   PHURINE 6.5 10/29/2014 0125   GLUCOSEU NEGATIVE 10/29/2014 0125   HGBUR TRACE (A) 10/29/2014 0125   HGBUR trace-lysed 10/29/2008 1057   BILIRUBINUR N 07/20/2017 1134   KETONESUR 40 (A) 10/29/2014 0125   PROTEINUR Positive (A) 07/20/2017 1134   PROTEINUR NEGATIVE 10/29/2014 0125   UROBILINOGEN 0.2 07/20/2017 1134   UROBILINOGEN 1.0 10/29/2014 0125   NITRITE N 07/20/2017 1134   NITRITE NEGATIVE 10/29/2014 0125   LEUKOCYTESUR Negative 07/20/2017 1134   Sepsis Labs: @LABRCNTIP (procalcitonin:4,lacticidven:4)  )No results found for this or any previous visit (from the past 240 hours).       Radiology Studies: No results found.       Scheduled Meds:  atorvastatin  40 mg Oral Daily   buPROPion ER  100 mg Oral QHS   enoxaparin (LOVENOX) injection  40 mg Subcutaneous Q24H   losartan  100 mg Oral Daily   metoprolol tartrate  12.5 mg Oral BID    oxyCODONE  15 mg Oral Q12H   pantoprazole  40 mg Oral Daily   sulfamethoxazole-trimethoprim  1 tablet Oral Daily   venlafaxine XR  150 mg Oral Q breakfast   Continuous Infusions:   LOS: 4 days    Time spent: 35 minutes.    Berton Mount, MD  [facility] Pager #: [phone] 7PM-7AM contact night coverage as above

## 2023-04-19 NOTE — TOC Progression Note (Signed)
 Transition of Care [facility]) - Progression Note    Patient Details  Name: Tonya Bartlett MRN: [id number] Date of Birth: July 19, 1945  Transition of Care [facility]) CM/SW Contact  Eduard Roux, [place] Phone Number: 04/19/2023, 10:12 AM  Clinical Narrative:     Sent message to [facility]- to follow up on SNF referral- waiting on response.  Antony Blackbird, MSW, LCSW Clinical Social Worker    Expected Discharge Plan: Skilled Nursing Facility Barriers to Discharge: Continued Medical Work up, SNF Pending bed offer  Expected Discharge Plan and Services       Living arrangements for the past 2 months: Single Family Home                                       Social Determinants of Health (SDOH) Interventions SDOH Screenings   Food Insecurity: No Food Insecurity (04/15/2023)  Housing: High Risk (04/15/2023)  Transportation Needs: No Transportation Needs (04/15/2023)  Utilities: Not At Risk (04/15/2023)  Alcohol Screen: Low Risk  (04/18/2021)  Depression (PHQ2-9): Medium Risk (03/26/2023)  Financial Resource Strain: Low Risk  (04/18/2021)  Physical Activity: Inactive (04/18/2021)  Social Connections: Moderately Integrated (04/18/2021)  Stress: No Stress Concern Present (04/18/2021)  Tobacco Use: Medium Risk (04/14/2023)    Readmission Risk Interventions     No data to display

## 2023-04-19 NOTE — Plan of Care (Signed)

## 2023-04-19 NOTE — Progress Notes (Signed)
 Physical Therapy Treatment Patient Details Name: Tonya Bartlett MRN: [id number] DOB: 06/10/1945 Today's Date: 04/19/2023   History of Present Illness Pt is a 78 y.o. female admitted 2/26 following a fall at home. She sustained head laceration, and compression fxs (T2, T6, L1). Head CT negative. PMH: HTN, hyperlipidemia, obesity, IBS, melanoma, breast ca, and a fib on Eliquis, GERD, depression, ADD, ORIF R ankle (07/2020), THA, vertigo (vestibular PT Jan 2025)    PT Comments  The pt was agreeable to session with focus on progressing OOB mobility. The pt reports she has been OOB multiple times today, but has been limited by pain and difficulty finding a comfortable position. The pt made great progress with ambulation distance, but benefits from chair follow for safety due to poor insight to safety and memory. Continue to recommend inpatient rehab <3hours/day to facilitate improvement towards maximal level of independence.      If plan is discharge home, recommend the following: A little help with walking and/or transfers;A little help with bathing/dressing/bathroom;Assistance with cooking/housework;Assist for transportation;Help with stairs or ramp for entrance   Can travel by private vehicle     Yes  Equipment Recommendations  None recommended by PT    Recommendations for Other Services       Precautions / Restrictions Precautions Precautions: Fall;Back Precaution Booklet Issued: Yes (comment) Recall of Precautions/Restrictions: Intact Precaution/Restrictions Comments: history of multiple falls, educated on back precautions Restrictions Weight Bearing Restrictions Per Provider Order: No     Mobility  Bed Mobility Overal bed mobility: Needs Assistance Bed Mobility: Rolling, Sidelying to Sit, Sit to Sidelying Rolling: Supervision Sidelying to sit: Min assist     Sit to sidelying: Min assist General bed mobility comments: cues for log roll, minA to elevate trunk, minA to bring LE  into bed    Transfers Overall transfer level: Needs assistance Equipment used: Rolling walker (2 wheels) Transfers: Sit to/from Stand Sit to Stand: From elevated surface, Min assist, Mod assist           General transfer comment: modA initially to rise, then minA on future attempts, cues for hand positioning each time    Ambulation/Gait Ambulation/Gait assistance: Min assist, +2 safety/equipment Gait Distance (Feet): 55 Feet (+ 53ft) Assistive device: Rolling walker (2 wheels) Gait Pattern/deviations: Step-through pattern, Decreased stride length Gait velocity: decreased Gait velocity interpretation: <1.31 ft/sec, indicative of household ambulator   General Gait Details: decreased foot clearance on L. Distance limited by LE weakness, chair follow for safety      Balance Overall balance assessment: Needs assistance Sitting-balance support: No upper extremity supported, Feet supported Sitting balance-Leahy Scale: Good     Standing balance support: Bilateral upper extremity supported, During functional activity, Reliant on assistive device for balance Standing balance-Leahy Scale: Poor Standing balance comment: dependent on BUE support                            Communication Communication Communication: No apparent difficulties  Cognition Arousal: Alert Behavior During Therapy: WFL for tasks assessed/performed   PT - Cognitive impairments: Memory, Attention, Problem solving, Safety/Judgement                         Following commands: Intact      Cueing Cueing Techniques: Verbal cues  Exercises      General Comments General comments (skin integrity, edema, etc.): VSS on RA, daughter present and supportive      Pertinent  Vitals/Pain Pain Assessment Pain Assessment: Faces Faces Pain Scale: Hurts little more Pain Location: back Pain Descriptors / Indicators: Discomfort, Grimacing, Guarding Pain Intervention(s): Limited activity within  patient's tolerance, Monitored during session, Repositioned     PT Goals (current goals can now be found in the care plan section) Acute Rehab PT Goals Patient Stated Goal: rehab at [facility] then home PT Goal Formulation: With patient Time For Goal Achievement: 05/01/23 Potential to Achieve Goals: Good Progress towards PT goals: Progressing toward goals    Frequency    Min 1X/week       AM-PAC PT "6 Clicks" Mobility   Outcome Measure  Help needed turning from your back to your side while in a flat bed without using bedrails?: A Little Help needed moving from lying on your back to sitting on the side of a flat bed without using bedrails?: A Little Help needed moving to and from a bed to a chair (including a wheelchair)?: A Little Help needed standing up from a chair using your arms (e.g., wheelchair or bedside chair)?: A Little Help needed to walk in hospital room?: A Little Help needed climbing 3-5 steps with a railing? : Total 6 Click Score: 16    End of Session Equipment Utilized During Treatment: Gait belt Activity Tolerance: Patient tolerated treatment well Patient left: in bed;with call bell/phone within reach;with bed alarm set;with family/visitor present Nurse Communication: Mobility status PT Visit Diagnosis: Difficulty in walking, not elsewhere classified (R26.2)     Time: 5621-3086 PT Time Calculation (min) (ACUTE ONLY): 36 min  Charges:    $Gait Training: 8-22 mins $Therapeutic Exercise: 8-22 mins PT General Charges $$ ACUTE PT VISIT: 1 Visit                     Vickki Muff, PT, DPT   Acute Rehabilitation Department Office [phone] Secure Chat Communication Preferred   Ronnie Derby 04/19/2023, 4:04 PM

## 2023-04-19 NOTE — TOC Progression Note (Signed)
 Transition of Care [facility]) - Progression Note    Patient Details  Name: Tonya Bartlett MRN: [id number] Date of Birth: 1945-09-14  Transition of Care [facility]) CM/SW Contact  Eduard Roux, [place] Phone Number: 04/19/2023, 4:11 PM  Clinical Narrative:     [name] states they can make an offer- but the patient will have to pay $46.00 per day not offered by insurance, for private room.  CSW spoke with patient- she agrees to $46.00 per day. CSW informed, anticipates d/c tomorrow. She states understanding.   TOC will continue to follow and assist with discharge planning.   Antony Blackbird, MSW, LCSW Clinical Social Worker    Expected Discharge Plan: Skilled Nursing Facility Barriers to Discharge: Continued Medical Work up, SNF Pending bed offer  Expected Discharge Plan and Services       Living arrangements for the past 2 months: Single Family Home                                       Social Determinants of Health (SDOH) Interventions SDOH Screenings   Food Insecurity: No Food Insecurity (04/15/2023)  Housing: High Risk (04/15/2023)  Transportation Needs: No Transportation Needs (04/15/2023)  Utilities: Not At Risk (04/15/2023)  Alcohol Screen: Low Risk  (04/18/2021)  Depression (PHQ2-9): Medium Risk (03/26/2023)  Financial Resource Strain: Low Risk  (04/18/2021)  Physical Activity: Inactive (04/18/2021)  Social Connections: Moderately Integrated (04/18/2021)  Stress: No Stress Concern Present (04/18/2021)  Tobacco Use: Medium Risk (04/14/2023)    Readmission Risk Interventions     No data to display

## 2023-04-20 DIAGNOSIS — Z743 Need for continuous supervision: Secondary | ICD-10-CM | POA: Diagnosis not present

## 2023-04-20 DIAGNOSIS — E785 Hyperlipidemia, unspecified: Secondary | ICD-10-CM | POA: Diagnosis not present

## 2023-04-20 DIAGNOSIS — E669 Obesity, unspecified: Secondary | ICD-10-CM | POA: Diagnosis not present

## 2023-04-20 DIAGNOSIS — R2689 Other abnormalities of gait and mobility: Secondary | ICD-10-CM | POA: Diagnosis not present

## 2023-04-20 DIAGNOSIS — S22020D Wedge compression fracture of second thoracic vertebra, subsequent encounter for fracture with routine healing: Secondary | ICD-10-CM | POA: Diagnosis not present

## 2023-04-20 DIAGNOSIS — K589 Irritable bowel syndrome without diarrhea: Secondary | ICD-10-CM | POA: Diagnosis not present

## 2023-04-20 DIAGNOSIS — R2681 Unsteadiness on feet: Secondary | ICD-10-CM | POA: Diagnosis not present

## 2023-04-20 DIAGNOSIS — M51369 Other intervertebral disc degeneration, lumbar region without mention of lumbar back pain or lower extremity pain: Secondary | ICD-10-CM | POA: Diagnosis not present

## 2023-04-20 DIAGNOSIS — S0990XA Unspecified injury of head, initial encounter: Secondary | ICD-10-CM | POA: Diagnosis not present

## 2023-04-20 DIAGNOSIS — I10 Essential (primary) hypertension: Secondary | ICD-10-CM | POA: Diagnosis not present

## 2023-04-20 DIAGNOSIS — Z853 Personal history of malignant neoplasm of breast: Secondary | ICD-10-CM | POA: Diagnosis not present

## 2023-04-20 DIAGNOSIS — I4891 Unspecified atrial fibrillation: Secondary | ICD-10-CM | POA: Diagnosis not present

## 2023-04-20 DIAGNOSIS — S32010D Wedge compression fracture of first lumbar vertebra, subsequent encounter for fracture with routine healing: Secondary | ICD-10-CM | POA: Diagnosis not present

## 2023-04-20 DIAGNOSIS — Z7901 Long term (current) use of anticoagulants: Secondary | ICD-10-CM | POA: Diagnosis not present

## 2023-04-20 DIAGNOSIS — M6281 Muscle weakness (generalized): Secondary | ICD-10-CM | POA: Diagnosis not present

## 2023-04-20 DIAGNOSIS — W19XXXD Unspecified fall, subsequent encounter: Secondary | ICD-10-CM | POA: Diagnosis not present

## 2023-04-20 DIAGNOSIS — D649 Anemia, unspecified: Secondary | ICD-10-CM | POA: Diagnosis not present

## 2023-04-20 DIAGNOSIS — S22050D Wedge compression fracture of T5-T6 vertebra, subsequent encounter for fracture with routine healing: Secondary | ICD-10-CM | POA: Diagnosis not present

## 2023-04-20 DIAGNOSIS — S22000D Wedge compression fracture of unspecified thoracic vertebra, subsequent encounter for fracture with routine healing: Secondary | ICD-10-CM | POA: Diagnosis not present

## 2023-04-20 DIAGNOSIS — M8580 Other specified disorders of bone density and structure, unspecified site: Secondary | ICD-10-CM | POA: Diagnosis not present

## 2023-04-20 MED ORDER — OXYCODONE HCL ER 15 MG PO T12A
15.0000 mg | EXTENDED_RELEASE_TABLET | Freq: Two times a day (BID) | ORAL | 0 refills | Status: DC
Start: 2023-04-20 — End: 2023-05-11

## 2023-04-20 MED ORDER — ACETAMINOPHEN 325 MG PO TABS: 650.0000 mg | ORAL_TABLET | ORAL | Status: AC | PRN

## 2023-04-20 MED ORDER — SENNOSIDES-DOCUSATE SODIUM 8.6-50 MG PO TABS: 1.0000 | ORAL_TABLET | Freq: Every evening | ORAL | 0 refills | Status: DC | PRN

## 2023-04-20 MED ORDER — BUPROPION HCL ER (SR) 100 MG PO TB12: 100.0000 mg | ORAL_TABLET | Freq: Every day | ORAL | 1 refills | Status: DC

## 2023-04-20 MED ORDER — CYCLOBENZAPRINE HCL 5 MG PO TABS: 5.0000 mg | ORAL_TABLET | Freq: Three times a day (TID) | ORAL | 0 refills | Status: DC | PRN

## 2023-04-20 MED ORDER — POLYETHYLENE GLYCOL 3350 17 G PO PACK: 17.0000 g | PACK | Freq: Every day | ORAL | 0 refills | Status: DC | PRN

## 2023-04-20 MED ORDER — HYDROCODONE-ACETAMINOPHEN 5-325 MG PO TABS: 2.0000 | ORAL_TABLET | ORAL | 0 refills | Status: DC | PRN

## 2023-04-20 MED ORDER — TRAZODONE HCL 50 MG PO TABS: 50.0000 mg | ORAL_TABLET | Freq: Every evening | ORAL | 0 refills | Status: DC | PRN

## 2023-04-20 NOTE — Plan of Care (Signed)

## 2023-04-20 NOTE — TOC Transition Note (Addendum)
 Transition of Care [facility]) - Discharge Note   Patient Details  Name: Tonya Bartlett MRN: [id number] Date of Birth: Aug 15, 1945  Transition of Care [facility]) CM/SW Contact:  Lorri Frederick, LCSW Phone Number: 04/20/2023, 1:30 PM   Clinical Narrative:   Pt discharging to [facility], room 108.  RN call report to [phone].    Pt daughter will transport.  Pt will need to be brought down to main north tower entrance with assistance getting into the vehicle.    1350: CSW informed by RN that pt wants to use [facility] for transport, is aware she may be billed for this.   [facility] called.   Final next level of care: Skilled Nursing Facility Barriers to Discharge: Barriers Resolved   Patient Goals and CMS Choice   CMS Medicare.gov Compare Post Acute Care list provided to:: Patient Choice offered to / list presented to : Patient [place] ownership interest in [facility].provided to:: Patient    Discharge Placement              Patient chooses bed at: [facility] Patient to be transferred to facility by: daughter Darl Pikes Name of family member notified: daughter Darl Pikes in room Patient and family notified of of transfer: 04/20/23  Discharge Plan and Services Additional resources added to the After Visit Summary for                                       Social Drivers of Health (SDOH) Interventions SDOH Screenings   Food Insecurity: No Food Insecurity (04/15/2023)  Housing: High Risk (04/15/2023)  Transportation Needs: No Transportation Needs (04/15/2023)  Utilities: Not At Risk (04/15/2023)  Alcohol Screen: Low Risk  (04/18/2021)  Depression (PHQ2-9): Medium Risk (03/26/2023)  Financial Resource Strain: Low Risk  (04/18/2021)  Physical Activity: Inactive (04/18/2021)  Social Connections: Moderately Integrated (04/18/2021)  Stress: No Stress Concern Present (04/18/2021)  Tobacco Use: Medium Risk (04/14/2023)     Readmission Risk Interventions     No data to display

## 2023-04-20 NOTE — Discharge Summary (Signed)
 Physician Discharge Summary  Patient ID: Tonya Bartlett MRN: [id number] DOB/AGE: 79/25/1947 79 y.o.  Admit date: 04/14/2023 Discharge date: 04/20/2023  Admission Diagnoses:  Discharge Diagnoses:  Principal Problem:   Vertebral compression fracture [facility]) Active Problems:   Closed fracture of cervical vertebral body (HCC)   Closed compression fracture of body of L1 vertebra [facility])   Discharged Condition: stable  Hospital Course:  Patient is a 78 year old female, with past medical history significant for hypertension, hyperlipidemia, obesity, IBS, melanoma, breast cancer, and atrial fibrillation on Eliquis.  Patient presented with recurrent mechanical falls, T2, T6 and L1 compression fractures.  Patient reported significant back pain.  Patient was admitted and managed supportively.  Adequate analgesia was ensured.  PT OT was consulted.  Patient has improved significantly.  Patient will be discharged to skilled nursing facility for short-term rehab.  Eliquis has been on hold.  Patient has had recurrent falls.   Mechanical fall: T2, T6 and L1 compression fractures: -ER provider discussed with trauma surgery team on presentation.  Conservative management was advised. -Adequate pain control. -PT OT consult. -Discharge patient to skilled nursing facility for short-term rehab. -Follow-up with primary care provider on discharge.     Hypertension: -Continue to optimize. -Blood pressure is reasonably controlled.   Hyperlipidemia:   Atrial fibrillation: -Heart rate ranges from 99 to 115 bpm.   -Eliquis is on hold.  Patient follows up with cardiology team.  Patient has had recurrent falls. -Continue metoprolol 12.5 Mg p.o. twice daily.  Consults: None  Significant Diagnostic Studies:  CT LUMBAR SPINE WITHOUT CONTRAST   TECHNIQUE: Multidetector CT imaging of the lumbar spine was performed without intravenous contrast administration. Multiplanar CT image reconstructions were also  generated.   RADIATION DOSE REDUCTION: This exam was performed according to the departmental dose-optimization program which includes automated exposure control, adjustment of the mA and/or kV according to patient size and/or use of iterative reconstruction technique.   COMPARISON:  CT lumbar spine 07/04/2011   FINDINGS: Segmentation: 5 lumbar type vertebrae.   Alignment: Normal.   Vertebrae: The bones are diffusely osteopenic. There is an acute fracture involving the L1 vertebral body. There is 5% loss of vertebral body height with no retropulsion of fracture fragments.   Paraspinal and other soft tissues: Negative.   Disc levels: There is moderate disc space narrowing at L5-S1. There is mild disc space narrowing at T12-L1 and L1-L2. Facet arthropathy seen bilaterally at L4-L5 and L5-S1. There is no significant central canal or neural foraminal stenosis at any level.   IMPRESSION: 1. Acute fracture of the L1 vertebral body with 5% loss of vertebral body height. No retropulsion of fracture fragments. 2. Diffuse osteopenia. 3. Degenerative changes of the lumbar spine.     Electronically Signed   By: Darliss Cheney M.D.   On: 04/14/2023 20:56    CT THORACIC SPINE WITHOUT CONTRAST   TECHNIQUE: Multidetector CT images of the thoracic were obtained using the standard protocol without intravenous contrast.   RADIATION DOSE REDUCTION: This exam was performed according to the departmental dose-optimization program which includes automated exposure control, adjustment of the mA and/or kV according to patient size and/or use of iterative reconstruction technique.   COMPARISON:  CT of the chest 04/07/2017   FINDINGS: Alignment: Normal.   Vertebrae: Bones are diffusely osteopenic. There is mild compression fracture of the superior endplate of T2 which is age indeterminate. There is mild compression deformity of the superior endplate of T6 which is age indeterminate, but  favored as  acute. No retropulsion of fracture fragments. There is 5% loss vertebral body height at both levels. There is mild dextroconvex curvature of the midthoracic spine.   Paraspinal and other soft tissues: Negative.   Disc levels: There is mild disc space narrowing and endplate osteophyte formation throughout the thoracic spine compatible with degenerative change. No significant central canal or neural foraminal stenosis identified.   IMPRESSION: 1. Mild compression fractures of the superior endplates of T2 and T6 are age indeterminate, but favored as acute. No retropulsion of fracture fragments. 2. Diffuse osteopenia. 3. Mild degenerative changes of the thoracic spine.     Electronically Signed   By: Darliss Cheney M.D.   On: 04/14/2023 21:06    CT HEAD WITHOUT CONTRAST:   CT CERVICAL SPINE WITHOUT CONTRAST:   TECHNIQUE: Multidetector CT imaging of the head and cervical spine was performed following the standard protocol without intravenous contrast. Multiplanar CT image reconstructions of the cervical spine were also generated.   RADIATION DOSE REDUCTION: This exam was performed according to the departmental dose-optimization program which includes automated exposure control, adjustment of the mA and/or kV according to patient size and/or use of iterative reconstruction technique.   COMPARISON:  01/08/2023   FINDINGS: CT HEAD FINDINGS   Brain: No evidence of acute infarction, hemorrhage, hydrocephalus, extra-axial collection or mass lesion/mass effect.   Vascular: No hyperdense vessel or unexpected calcification.   Skull: Normal. Negative for fracture or focal lesion.   Sinuses/Orbits: No acute finding.   Other: Scalp hematoma is noted near the vertex on right.   Traumatic Brain Injury Risk Stratification   Skull Fracture: No - Low/mBIG 1   Subdural Hematoma (SDH): No - Low   Subarachnoid Hemorrhage [facility]): No   Epidural Hematoma (EDH): No -  Low/mBIG 1   Cerebral contusion, intra-axial, intraparenchymal Hemorrhage (IPH): No   Intraventricular Hemorrhage (IVH): No - Low/mBIG 1   Midline Shift > 1mm or Edema/effacement of sulci/vents: No - Low/mBIG 1   ----------------------------------------------------   CT CERVICAL SPINE FINDINGS   Alignment: Mild straightening of the normal cervical lordosis likely related to muscular spasm.   Skull base and vertebrae: 7 cervical segments are well visualized. Vertebral body height is well maintained. No acute fracture or acute facet abnormality seen. Mild osteophytic changes are seen as well as mild facet hypertrophic changes. The odontoid is within normal limits.   Soft tissues and spinal canal: Surrounding soft tissue structures are within normal limits.   Upper chest: Visualized lung apices are.   Other: None   IMPRESSION: CT of the head: No acute intracranial abnormality noted.   Scalp hematoma at the vertex on the right.   CT of the cervical spine: Degenerative changes with findings suggestive of muscular spasm. No acute fracture is seen.     Electronically Signed   By: Alcide Clever M.D.   On: 04/14/2023 20:52        Discharge Exam: Blood pressure 114/81, pulse 83, temperature 98.7 F (37.1 C), temperature source Oral, resp. rate 19, height 5\' 6"  (1.676 m), weight 104.3 kg, SpO2 93%.   Disposition: Discharge disposition: 03-Skilled Nursing Facility       Discharge Instructions     Diet - low sodium heart healthy   Complete by: As directed    Discharge wound care:   Complete by: As directed    Continue current wound care regimen   Increase activity slowly   Complete by: As directed       Allergies as of 04/20/2023  Reactions   Cuprimine [penicillamine] Anaphylaxis   Nsaids Anaphylaxis, Other (See Comments)   Severe stomach cramping Mouth sores   Otezla [apremilast] Anaphylaxis   Suicidal ideation   Penicillins Anaphylaxis    Erythromycin Other (See Comments)   SEVERE STOMACH CRAMPS   Morphine And Codeine Nausea And Vomiting   Remicade [infliximab] Other (See Comments), Hypertension   Shut down immune system   Skyrizi [risankizumab] Other (See Comments), Cough   Upper respiratory infection Urinary tract infection Fever   Tolectin [tolmetin] Other (See Comments)   Severe stomach cramping   Nickel Rash   Including snaps on hospital gowns         Medication List     STOP taking these medications    cetirizine 10 MG tablet Commonly known as: ZYRTEC   DRY EYE RELIEF DROPS OP   Eliquis 5 MG Tabs tablet Generic drug: apixaban   ondansetron 4 MG tablet Commonly known as: Zofran   VITAMIN C PO   VITAMIN E PO       TAKE these medications    acetaminophen 325 MG tablet Commonly known as: TYLENOL Take 2 tablets (650 mg total) by mouth every 4 (four) hours as needed for mild pain (pain score 1-3) (or Fever >/= 101). What changed:  medication strength how much to take when to take this reasons to take this   albuterol 108 (90 Base) MCG/ACT inhaler Commonly known as: VENTOLIN HFA INHALE 2 PUFFS INTO THE LUNGS EVERY 6 (SIX) HOURS AS NEEDED FOR WHEEZING OR SHORTNESS OF BREATH (COUGH ). START WITH USING IN AM, AROUND NOON AND 1 HOUR BEFORE BEDTIME TO HELP COUGH SYMPTOMS.   atorvastatin 40 MG tablet Commonly known as: LIPITOR TAKE 1 TABLET BY MOUTH EVERY DAY   buPROPion ER 100 MG 12 hr tablet Commonly known as: WELLBUTRIN SR Take 1 tablet (100 mg total) by mouth at bedtime.   cyclobenzaprine 5 MG tablet Commonly known as: FLEXERIL Take 1 tablet (5 mg total) by mouth 3 (three) times daily as needed for muscle spasms.   HYDROcodone-acetaminophen 5-325 MG tablet Commonly known as: NORCO/VICODIN Take 2 tablets by mouth every 4 (four) hours as needed for moderate pain (pain score 4-6) or severe pain (pain score 7-10).   losartan 100 MG tablet Commonly known as: COZAAR Take 1 tablet (100 mg  total) by mouth daily.   metoprolol tartrate 25 MG tablet Commonly known as: LOPRESSOR TAKE 0.5 TABLETS BY MOUTH 2 TIMES DAILY. What changed: See the new instructions.   omeprazole 40 MG capsule Commonly known as: PRILOSEC TAKE 1 CAPSULE BY MOUTH 2 (TWO) TIMES DAILY. TAKE 30 MINUTES BEFORE BREAKFAST & DINNER. What changed: See the new instructions.   oxyCODONE 15 mg 12 hr tablet Commonly known as: OXYCONTIN Take 1 tablet (15 mg total) by mouth every 12 (twelve) hours.   polyethylene glycol 17 g packet Commonly known as: MIRALAX / GLYCOLAX Take 17 g by mouth daily as needed for moderate constipation.   senna-docusate 8.6-50 MG tablet Commonly known as: Senokot-S Take 1 tablet by mouth at bedtime as needed for mild constipation.   sulfamethoxazole-trimethoprim 400-80 MG tablet Commonly known as: BACTRIM Take 1 tablet by mouth at bedtime.   tamoxifen 10 MG tablet Commonly known as: NOLVADEX Take 1/2 tablet by mouth in the evening   traZODone 50 MG tablet Commonly known as: DESYREL Take 1 tablet (50 mg total) by mouth at bedtime as needed for sleep. What changed: See the new instructions.   venlafaxine XR  150 MG 24 hr capsule Commonly known as: EFFEXOR-XR Take 1 capsule (150 mg total) by mouth daily.               Discharge Care Instructions  (From admission, onward)           Start     Ordered   04/20/23 0000  Discharge wound care:       Comments: Continue current wound care regimen   04/20/23 1154            Time spent: 35 minutes.  SignedBarnetta Chapel 04/20/2023, 12:56 PM

## 2023-04-20 NOTE — Plan of Care (Signed)

## 2023-04-20 NOTE — TOC Progression Note (Signed)
 Transition of Care [facility]) - Progression Note    Patient Details  Name: Tonya Bartlett MRN: [id number] Date of Birth: Sep 08, 1945  [facility] CM/SW Contact  Lorri Frederick, LCSW Phone Number: 04/20/2023, 1:27 PM  Clinical Narrative:   CSW confirmed with [facility] that [facility] can receive pt today.  CSW spoke with pt and daughter Darl Pikes about [facility]arl Pikes providing transportation.  Pt quite upset about this but does not want to use ambulance if she may get the bill.      Expected Discharge Plan: Skilled Nursing Facility Barriers to Discharge: Continued Medical Work up, SNF Pending bed offer  Expected Discharge Plan and Services       Living arrangements for the past 2 months: Single Family Home Expected Discharge Date: 04/20/23                                     Social Determinants of Health (SDOH) Interventions SDOH Screenings   Food Insecurity: No Food Insecurity (04/15/2023)  Housing: High Risk (04/15/2023)  Transportation Needs: No Transportation Needs (04/15/2023)  Utilities: Not At Risk (04/15/2023)  Alcohol Screen: Low Risk  (04/18/2021)  Depression (PHQ2-9): Medium Risk (03/26/2023)  Financial Resource Strain: Low Risk  (04/18/2021)  Physical Activity: Inactive (04/18/2021)  Social Connections: Moderately Integrated (04/18/2021)  Stress: No Stress Concern Present (04/18/2021)  Tobacco Use: Medium Risk (04/14/2023)    Readmission Risk Interventions     No data to display

## 2023-04-22 ENCOUNTER — Ambulatory Visit (INDEPENDENT_AMBULATORY_CARE_PROVIDER_SITE_OTHER): Payer: Self-pay | Admitting: Licensed Clinical Social Worker

## 2023-04-22 ENCOUNTER — Encounter (HOSPITAL_COMMUNITY): Payer: Self-pay

## 2023-04-22 ENCOUNTER — Ambulatory Visit (HOSPITAL_COMMUNITY): Payer: Self-pay | Admitting: Licensed Clinical Social Worker

## 2023-04-22 DIAGNOSIS — Z91199 Patient's noncompliance with other medical treatment and regimen due to unspecified reason: Secondary | ICD-10-CM

## 2023-04-22 NOTE — Progress Notes (Signed)
 Patient did not show for assessment

## 2023-05-06 ENCOUNTER — Ambulatory Visit: Payer: Medicare Other | Admitting: Cardiology

## 2023-05-08 ENCOUNTER — Other Ambulatory Visit: Payer: Self-pay | Admitting: Adult Health

## 2023-05-08 DIAGNOSIS — I10 Essential (primary) hypertension: Secondary | ICD-10-CM

## 2023-05-10 ENCOUNTER — Other Ambulatory Visit: Payer: Self-pay

## 2023-05-10 ENCOUNTER — Encounter (HOSPITAL_COMMUNITY): Payer: Self-pay | Admitting: Psychiatry

## 2023-05-10 ENCOUNTER — Telehealth (INDEPENDENT_AMBULATORY_CARE_PROVIDER_SITE_OTHER): Payer: Medicare Other | Admitting: Psychiatry

## 2023-05-10 DIAGNOSIS — F1021 Alcohol dependence, in remission: Secondary | ICD-10-CM

## 2023-05-10 DIAGNOSIS — F331 Major depressive disorder, recurrent, moderate: Secondary | ICD-10-CM | POA: Diagnosis not present

## 2023-05-10 DIAGNOSIS — F5102 Adjustment insomnia: Secondary | ICD-10-CM

## 2023-05-10 MED ORDER — METOPROLOL TARTRATE 25 MG PO TABS: 12.5000 mg | ORAL_TABLET | Freq: Two times a day (BID) | ORAL | 3 refills | Status: DC

## 2023-05-10 NOTE — Progress Notes (Signed)
 [facility] Outpatient visit Tele psych  Patient Identification: Tonya Bartlett MRN:  [id number] Date of Evaluation:  05/10/2023 Referral Source: NP Chief Complaint:   Depression follow up  Visit Diagnosis:    ICD-10-CM   1. Major depressive disorder, recurrent episode, moderate (HCC)  F33.1     2. Adjustment insomnia  F51.02     3. Alcohol dependence in remission [facility])  F10.21     Virtual Visit via Video Note  I connected with Tonya Bartlett on 05/10/23 at  2:00 PM EDT by a video enabled telemedicine application and verified that I am speaking with the correct person using two identifiers.  Location: Patient: home Provider: home office   I discussed the limitations of evaluation and management by telemedicine and the availability of in person appointments. The patient expressed understanding and agreed to proceed.     I discussed the assessment and treatment plan with the patient. The patient was provided an opportunity to ask questions and all were answered. The patient agreed with the plan and demonstrated an understanding of the instructions.   The patient was advised to call back or seek an in-person evaluation if the symptoms worsen or if the condition fails to improve as anticipated.  I provided 18 minutes of non-face-to-face time during this encounte       History of Present Illness: 78  years old currently widowed white female lives by herself has 3 grown kids and 6 grandkids referred initially by family medicine for management of depression  Remains sober 40 plus years, attends and chair AA groups  Has had breast surgery daignosed with cancer, recovering, on radiation.   On tomoxifen Last visit wellbutrin was added for depression but she hurt her back from getting out of car. Had gone thru admission, rehab and is on narco for pain. Avoiding percocet Daughter is supportive Just came back home  Says it was compression break and didn't require surgery so its a good  sign  Has missed AA at times this week feel being obese gives guild or makes her down  Modifying factors : family, daughter aggravating factors. Hip surgery,  breast surgery, medical conditions Severity  somewhat subdued due to recent injury and events  Past Psychiatric History: depression  Previous Psychotropic Medications: Yes   Substance Abuse History in the last 12 months:  No.  Consequences of Substance Abuse: NA  Past Medical History:  Past Medical History:  Diagnosis Date   Alcohol abuse    Sober 41 years.   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  1996   History of hiatal hernia    History of iron deficiency anemia 1996   iron infusion   History of migraine headaches    History of radiation therapy  left breast 03/27/2021-04/23/2021 Dr Antony Blackbird   History of rib fracture    Hyperlipidemia    Hypertension    IBS (irritable bowel syndrome)    Insomnia    Joint pain    MCI (mild cognitive impairment)    Melanoma (HCC) 2019   left leg    Migraine headache    hx of migraines    OA (osteoarthritis)    RIGHT SHOULDER   Osteopenia    Personal history of radiation therapy    Pneumonia    remote history   PONV (postoperative nausea and vomiting)    Recovering alcoholic (HCC)    SINCE 01-21-1980   Right rotator cuff tear    SOB (shortness of breath) on exertion    Substance abuse (HCC)    recovering alcoholic   Swallowing difficulty     Unspecified essential hypertension     Past Surgical History:  Procedure Laterality Date   ATRIAL FIBRILLATION ABLATION N/A 10/13/2022   Procedure: ATRIAL FIBRILLATION ABLATION;  Surgeon: Lanier Prude, MD;  Location: [facility]IVE [facility];  Service: Cardiovascular;  Laterality: N/A;   BREAST BIOPSY Left 01/06/2021   pos   BREAST LUMPECTOMY     BREAST LUMPECTOMY WITH RADIOACTIVE SEED LOCALIZATION Left 02/27/2021   Procedure: LEFT BREAST LUMPECTOMY WITH RADIOACTIVE SEED LOCALIZATION;  Surgeon: Almond Lint, MD;  Location: [facility] OR;  Service: General;  Laterality: Left;   BUNIONECTOMY/  HAMMERTOE CORRECTION  RIGHT FOOT  2011   CATARACT EXTRACTION W/ INTRAOCULAR LENS  IMPLANT, BILATERAL     COLONOSCOPY     DILATION AND CURETTAGE OF UTERUS  1976   EYE SURGERY Bilateral 2016   cataract removal   KNEE ARTHROSCOPY W/ MENISCECTOMY Bilateral X2  LEFT /    X1  RIGHT   KNEE OPEN LATERAL RELEASE Bilateral    MOHS SURGERY Left 12/15/2017   Melanoma in situ - left calf - [facility]   ORIF ANKLE FRACTURE Right 08/04/2020   Procedure: OPEN REDUCTION INTERNAL FIXATION (ORIF) ANKLE FRACTURE;  Surgeon: Toni Arthurs, MD;  Location: [facility] ORS;  Service: Orthopedics;  Laterality: Right;  Mini C-arm, [employer] Small Frag   REPLACEMENT TOTAL KNEE Left 2006   SHOULDER ARTHROSCOPY WITH SUBACROMIAL DECOMPRESSION, ROTATOR CUFF REPAIR AND BICEP TENDON REPAIR Right 05/23/2013   Procedure: RIGHT SHOULDER ARTHROSCOPY EXAM UNDER ANESTHESIA  WITH SUBACROMIAL DECOMPRESSION,DISTAL CLAVICLE RESECTION, SADLABRAL DEBRIDEMENT CHONDROPLASTY, BICEP TENOTOMY ;  Surgeon: Eugenia Mcalpine, MD;  Location: [facility];  Service: Orthopedics;  Laterality: Right;   TONSILLECTOMY AND ADENOIDECTOMY  AGE 15   TOTAL HIP ARTHROPLASTY Left 05/04/2017   Procedure: LEFT TOTAL HIP ARTHROPLASTY ANTERIOR APPROACH;  Surgeon: Durene Romans, MD;  Location: [facility];  Service: Orthopedics;  Laterality: Left;   TOTAL HIP  ARTHROPLASTY Right 08/01/2019   Procedure: TOTAL HIP ARTHROPLASTY ANTERIOR APPROACH;  Surgeon: Durene Romans, MD;  Location: [facility];  Service: Orthopedics;  Laterality: Right;    TOTAL KNEE ARTHROPLASTY Bilateral LEFT  1996/   RIGHT 2004   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      UPPER GASTROINTESTINAL ENDOSCOPY     UPPER GI ENDOSCOPY     VAGINAL HYSTERECTOMY  1976    Family Psychiatric History: mom possible depression  Family History:  Family History  Problem Relation Age of Onset   Cancer Mother    Heart disease Mother    Hypertension Mother    Melanoma Mother 39   Obesity Mother    Heart disease Father 22  Hypertension Father    Esophageal cancer Brother    Dementia Paternal Grandfather    Melanoma Niece 66   Colon cancer Neg Hx    Colon polyps Neg Hx    Stomach cancer Neg Hx    Rectal cancer Neg Hx     Social History:   Social History   Socioeconomic History   Marital status: Widowed    Spouse name: Not on file   Number of children: 3   Years of education: Not on file   Highest education level: Master's degree (e.g., MA, MS, MEng, MEd, MSW, MBA)  Occupational History   Occupation: retired Paramedic  Tobacco Use   Smoking status: Former    Current packs/day: 0.00    Average packs/day: 0.5 packs/day for 15.0 years (7.5 ttl pk-yrs)    Types: Cigarettes    Start date: 05/16/1970    Quit date: 05/15/1985    Years since quitting: 38.0   Smokeless tobacco: Never   Tobacco comments:    Former smoker 12/10/22  Vaping Use   Vaping status: Never Used  Substance and Sexual Activity   Alcohol use: No    Comment: RECOVERING ALCOHOLIC--  QUIT [id number]   Drug use: No   Sexual activity: Not Currently    Birth control/protection: Post-menopausal  Other Topics Concern   Not on file  Social History Narrative   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    Widowed    Three kids    6 grandchildren       Social Drivers of Manufacturing engineer Strain: Low Risk  (04/18/2021)   Overall Financial Resource Strain (CARDIA)    Difficulty of Paying Living Expenses: Not hard at all  Food Insecurity: No Food Insecurity (04/15/2023)   Hunger Vital Sign    Worried About Running Out of Food in the Last Year: Never true    Ran Out of Food in the Last Year: Never true  Transportation Needs: No Transportation Needs (04/15/2023)   PRAPARE - Administrator, Civil Service (Medical): No    Lack of Transportation (Non-Medical): No  Physical Activity: Inactive (04/18/2021)   Exercise Vital Sign    Days of Exercise per Week: 0 days    Minutes of Exercise per Session: 0 min  Stress: No Stress Concern Present (04/18/2021)   [employer] of Occupational Health - Occupational Stress Questionnaire    Feeling of Stress : Not at all  Social Connections: Moderately Integrated (04/18/2021)   Social Connection and Isolation Panel [NHANES]    Frequency of Communication with Friends and Family: More than three times a week    Frequency of Social Gatherings with Friends and Family: More than three times a week    Attends Religious Services: More than 4 times per year    Active Member of [employer] or Organizations: Yes    Attends Banker Meetings: More than 4 times per year    Marital Status: Widowed     Allergies:   Allergies  Allergen Reactions   Cuprimine [Penicillamine] Anaphylaxis   Nsaids Anaphylaxis and Other (See Comments)    Severe stomach cramping Mouth sores   Otezla [Apremilast] Anaphylaxis    Suicidal ideation   Penicillins Anaphylaxis   Erythromycin Other (See Comments)    SEVERE STOMACH CRAMPS   Morphine And Codeine Nausea And Vomiting   Remicade [Infliximab] Other (See Comments) and Hypertension    Shut down immune system  Skyrizi [Risankizumab] Other (See Comments) and Cough    Upper respiratory infection Urinary tract infection Fever   Tolectin [Tolmetin] Other (See Comments)     Severe stomach cramping   Nickel Rash    Including snaps on hospital gowns     Metabolic Disorder Labs: Lab Results  Component Value Date   HGBA1C 5.3 01/29/2022   No results found for: "PROLACTIN" Lab Results  Component Value Date   CHOL 147 01/29/2022   TRIG 84 01/29/2022   HDL 61 01/29/2022   CHOLHDL 2.7 06/11/2021   VLDL 78.2 04/24/2021   LDLCALC 70 01/29/2022   LDLCALC 90 06/11/2021     Current Medications: Current Outpatient Medications  Medication Sig Dispense Refill   acetaminophen (TYLENOL) 325 MG tablet Take 2 tablets (650 mg total) by mouth every 4 (four) hours as needed for mild pain (pain score 1-3) (or Fever >/= 101).     albuterol (VENTOLIN HFA) 108 (90 Base) MCG/ACT inhaler INHALE 2 PUFFS INTO THE LUNGS EVERY 6 (SIX) HOURS AS NEEDED FOR WHEEZING OR SHORTNESS OF BREATH (COUGH ). START WITH USING IN AM, AROUND NOON AND 1 HOUR BEFORE BEDTIME TO HELP COUGH SYMPTOMS. 8.5 each 1   atorvastatin (LIPITOR) 40 MG tablet TAKE 1 TABLET BY MOUTH EVERY DAY 90 tablet 3   buPROPion ER (WELLBUTRIN SR) 100 MG 12 hr tablet Take 1 tablet (100 mg total) by mouth at bedtime. 30 tablet 1   cyclobenzaprine (FLEXERIL) 5 MG tablet Take 1 tablet (5 mg total) by mouth 3 (three) times daily as needed for muscle spasms. 30 tablet 0   HYDROcodone-acetaminophen (NORCO/VICODIN) 5-325 MG tablet Take 2 tablets by mouth every 4 (four) hours as needed for moderate pain (pain score 4-6) or severe pain (pain score 7-10). 30 tablet 0   losartan (COZAAR) 100 MG tablet Take 1 tablet (100 mg total) by mouth daily. 90 tablet 3   metoprolol tartrate (LOPRESSOR) 25 MG tablet Take 0.5 tablets (12.5 mg total) by mouth 2 (two) times daily. 90 tablet 3   omeprazole (PRILOSEC) 40 MG capsule TAKE 1 CAPSULE BY MOUTH 2 (TWO) TIMES DAILY. TAKE 30 MINUTES BEFORE BREAKFAST & DINNER. (Patient taking differently: Take 40 mg by mouth daily.) 180 capsule 1   oxyCODONE (OXYCONTIN) 15 mg 12 hr tablet Take 1 tablet (15 mg total)  by mouth every 12 (twelve) hours. 14 tablet 0   polyethylene glycol (MIRALAX / GLYCOLAX) 17 g packet Take 17 g by mouth daily as needed for moderate constipation. 14 each 0   senna-docusate (SENOKOT-S) 8.6-50 MG tablet Take 1 tablet by mouth at bedtime as needed for mild constipation. 30 tablet 0   sulfamethoxazole-trimethoprim (BACTRIM,SEPTRA) 400-80 MG tablet Take 1 tablet by mouth at bedtime.     tamoxifen (NOLVADEX) 10 MG tablet Take 1/2 tablet by mouth in the evening 90 tablet 1   traZODone (DESYREL) 50 MG tablet Take 1 tablet (50 mg total) by mouth at bedtime as needed for sleep. 30 tablet 0   venlafaxine XR (EFFEXOR-XR) 150 MG 24 hr capsule Take 1 capsule (150 mg total) by mouth daily. 90 capsule 0   No current facility-administered medications for this visit.     Psychiatric Specialty Exam: Review of Systems  Musculoskeletal:  Positive for back pain.  Psychiatric/Behavioral:  Negative for suicidal ideas.     There were no vitals taken for this visit.There is no height or weight on file to calculate BMI.  General Appearance: casual  Eye Contact:  fair  Speech:  Normal Rate  Volume:  Decreased  Mood: somewhat subdued  Affect: congruent  Thought Process:  Goal Directed  Orientation:  Full (Time, Place, and Person)  Thought Content:  Rumination  Suicidal Thoughts:  No  Homicidal Thoughts:  No  Memory:  Immediate;   Fair Recent;   Fair  Judgement:  Fair  Insight:  Fair  Psychomotor Activity:  Normal  Concentration:  Concentration: Fair and Attention Span: Fair  Recall:  [employer] of Knowledge:Good  Language: Good  Akathisia:  No  Handed:  Right  AIMS (if indicated):    Assets:  Desire for Improvement  ADL's:  Intact  Cognition: Impaired,  Mild  Sleep:  Variable to fair on meds    Treatment Plan Summary: Medication management and Plan as follows    Prior documentation reviewed    1. MDD, mild to moderate: somewhat subdued, will keep wellbutrin and effexor ,  considering what she has gone thru is still handling it fair  2. Insomnia: reviewed sleep hygiene, is on trazadone 3. Alcohol dependence:sustained remission, remains sober, avoided taking percocent due to alcohol addiction. Continue sobreity and relapse prevention  Reviewed meds, questions addressed  Fu 2- 3 m.     Thresa Ross, MD 3/24/20252:15 PM

## 2023-05-11 ENCOUNTER — Ambulatory Visit: Admitting: Adult Health

## 2023-05-11 VITALS — BP 120/88 | HR 84 | Temp 97.8°F | Ht 66.0 in | Wt 224.0 lb

## 2023-05-11 DIAGNOSIS — S22050D Wedge compression fracture of T5-T6 vertebra, subsequent encounter for fracture with routine healing: Secondary | ICD-10-CM

## 2023-05-11 DIAGNOSIS — I10 Essential (primary) hypertension: Secondary | ICD-10-CM | POA: Diagnosis not present

## 2023-05-11 DIAGNOSIS — R2681 Unsteadiness on feet: Secondary | ICD-10-CM | POA: Diagnosis not present

## 2023-05-11 DIAGNOSIS — S32010A Wedge compression fracture of first lumbar vertebra, initial encounter for closed fracture: Secondary | ICD-10-CM

## 2023-05-11 DIAGNOSIS — E782 Mixed hyperlipidemia: Secondary | ICD-10-CM | POA: Diagnosis not present

## 2023-05-11 DIAGNOSIS — I4891 Unspecified atrial fibrillation: Secondary | ICD-10-CM | POA: Diagnosis not present

## 2023-05-11 DIAGNOSIS — T148XXA Other injury of unspecified body region, initial encounter: Secondary | ICD-10-CM | POA: Diagnosis not present

## 2023-05-11 DIAGNOSIS — S22020D Wedge compression fracture of second thoracic vertebra, subsequent encounter for fracture with routine healing: Secondary | ICD-10-CM | POA: Diagnosis not present

## 2023-05-11 NOTE — Progress Notes (Signed)
 Subjective:    Patient ID: Tonya Bartlett, female    DOB: [birth year], 78 y.o.   MRN: [id number]  HPI 78 year old female who  has a past medical history of Alcohol abuse, Allergic rhinitis, Allergy, Anemia, Anxiety, Blood transfusion without reported diagnosis, Breast cancer (HCC) (12/2020), Cancer (HCC), Cataract, Clotting disorder (HCC), Colitis, Complication of anesthesia, Depression, Diverticulosis, Dysrhythmia, Fatigue, Frequency of urination, GERD (gastroesophageal reflux disease), History of colon polyps, History of DVT of lower extremity, History of hiatal hernia, History of iron deficiency anemia (1996), History of migraine headaches, History of radiation therapy, History of rib fracture, Hyperlipidemia, Hypertension, IBS (irritable bowel syndrome), Insomnia, Joint pain, MCI (mild cognitive impairment), Melanoma (HCC) (2019), Migraine headache, OA (osteoarthritis), Osteopenia, Personal history of radiation therapy, Pneumonia, PONV (postoperative nausea and vomiting), Recovering alcoholic (HCC), Right rotator cuff tear, SOB (shortness of breath) on exertion, Substance abuse (HCC), Swallowing difficulty, and Unspecified essential hypertension.  She presents to the office today for TCM visit   Admit Date 04/14/2023 Discharge Date 04/20/2023 Discharge from rehab: 05/10/2023  Presented to the emergency room with recurrent mechanical falls, She was found to have T2, T6, and L1 compression fractures.  Hospital Course  Mechanical Fall with T2, T6 and L1 compression fracture  - Trauma Surgery advised conservative measures.  - PT/OT consulted and was discharged to skilled nursing facility for short term rehab   Hypertension  - Continued on Losartan 100 mg daily and Lopressor 12.5 mg BID   Hyperlipidemia  - Continued on Lipitor 40 mg daily   Atrial Fibrillation  -Eliquis was held.  She was continued on metoprolol 12.5 mg p.o. twice daily  Today she reports that she feels like she has made  strides at rehab. She continues to feel weak and off balance, having to use a walker to ambulate but she feels as though this is improving.  Home health PT OT has reached out but has not scheduled an appointment just yet.  She continues to be in pain but her pain management doctor recently sent in some medication for her.   She has a hematoma on the top of her head from where she fell.  Reports that her hematoma continues to bleed when she is in the shower.      Review of Systems  Constitutional:  Positive for fatigue.  HENT: Negative.    Eyes: Negative.   Respiratory: Negative.    Cardiovascular: Negative.   Gastrointestinal: Negative.   Endocrine: Negative.   Genitourinary: Negative.   Musculoskeletal:  Positive for arthralgias, back pain and gait problem.  Skin: Negative.   Allergic/Immunologic: Negative.   Hematological: Negative.   Psychiatric/Behavioral: Negative.     Past Medical History:  Diagnosis Date   Alcohol abuse    Sober 41 years.   Allergic rhinitis    Allergy    Anemia    Anxiety    Blood transfusion without reported diagnosis    Breast cancer (HCC) 12/2020   left   Cancer (HCC)    Cataract    bil cataracts removed   Clotting disorder (HCC)    DVT- 1996 po knee replacement   Colitis    Complication of anesthesia    vomit x1 while on Morphine per pt   Depression    Diverticulosis    Dysrhythmia    Atrial fibrillation   Fatigue    Frequency of urination    GERD (gastroesophageal reflux disease)    History of colon polyps    History of  DVT of lower extremity    POST LEFT TOTAL KNEE  1996   History of hiatal hernia    History of iron deficiency anemia 1996   iron infusion   History of migraine headaches    History of radiation therapy    left breast 03/27/2021-04/23/2021 Dr Antony Blackbird   History of rib fracture    Hyperlipidemia    Hypertension    IBS (irritable bowel syndrome)    Insomnia    Joint pain    MCI (mild cognitive impairment)     Melanoma (HCC) 2019   left leg    Migraine headache    hx of migraines    OA (osteoarthritis)    RIGHT SHOULDER   Osteopenia    Personal history of radiation therapy    Pneumonia    remote history   PONV (postoperative nausea and vomiting)    Recovering alcoholic (HCC)    SINCE 01-21-1980   Right rotator cuff tear    SOB (shortness of breath) on exertion    Substance abuse (HCC)    recovering alcoholic   Swallowing difficulty    Unspecified essential hypertension     Social History   Socioeconomic History   Marital status: Widowed    Spouse name: Not on file   Number of children: 3   Years of education: Not on file   Highest education level: Master's degree (e.g., MA, MS, MEng, MEd, MSW, MBA)  Occupational History   Occupation: retired Paramedic  Tobacco Use   Smoking status: Former    Current packs/day: 0.00    Average packs/day: 0.5 packs/day for 15.0 years (7.5 ttl pk-yrs)    Types: Cigarettes    Start date: 05/16/1970    Quit date: 05/15/1985    Years since quitting: 38.0   Smokeless tobacco: Never   Tobacco comments:    Former smoker 12/10/22  Vaping Use   Vaping status: Never Used  Substance and Sexual Activity   Alcohol use: No    Comment: RECOVERING ALCOHOLIC--  QUIT [id number]   Drug use: No   Sexual activity: Not Currently    Birth control/protection: Post-menopausal  Other Topics Concern   Not on file  Social History Narrative   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    Widowed    Three kids    6 grandchildren       Social Drivers of Corporate investment banker Strain: Low Risk  (04/18/2021)   Overall Financial Resource Strain (CARDIA)    Difficulty of Paying Living Expenses: Not hard at all  Food Insecurity: No Food Insecurity (04/15/2023)   Hunger Vital Sign    Worried About Running Out of Food in the Last Year: Never true    Ran Out of Food in the Last Year: Never true  Transportation Needs: No  Transportation Needs (04/15/2023)   PRAPARE - Administrator, Civil Service (Medical): No    Lack of Transportation (Non-Medical): No  Physical Activity: Inactive (04/18/2021)   Exercise Vital Sign    Days of Exercise per Week: 0 days    Minutes of Exercise per Session: 0 min  Stress: No Stress Concern Present (04/18/2021)   [employer] of Occupational Health - Occupational Stress Questionnaire    Feeling of Stress : Not at all  Social Connections: Moderately Integrated (04/18/2021)   Social Connection and Isolation Panel [NHANES]    Frequency of Communication with Friends and Family: More than three  times a week    Frequency of Social Gatherings with Friends and Family: More than three times a week    Attends Religious Services: More than 4 times per year    Active Member of Clubs or Organizations: Yes    Attends Banker Meetings: More than 4 times per year    Marital Status: Widowed  Intimate Partner Violence: Not At Risk (04/15/2023)   Humiliation, Afraid, Rape, and Kick questionnaire    Fear of Current or Ex-Partner: No    Emotionally Abused: No    Physically Abused: No    Sexually Abused: No    Past Surgical History:  Procedure Laterality Date   ATRIAL FIBRILLATION ABLATION N/A 10/13/2022   Procedure: ATRIAL FIBRILLATION ABLATION;  Surgeon: Lanier Prude, MD;  Location: [facility];  Service: Cardiovascular;  Laterality: N/A;   BREAST BIOPSY Left 01/06/2021   pos   BREAST LUMPECTOMY     BREAST LUMPECTOMY WITH RADIOACTIVE SEED LOCALIZATION Left 02/27/2021   Procedure: LEFT BREAST LUMPECTOMY WITH RADIOACTIVE SEED LOCALIZATION;  Surgeon: Almond Lint, MD;  Location: [facility];  Service: General;  Laterality: Left;   BUNIONECTOMY/  HAMMERTOE CORRECTION  RIGHT FOOT  2011   CATARACT EXTRACTION W/ INTRAOCULAR LENS  IMPLANT, BILATERAL     COLONOSCOPY     DILATION AND CURETTAGE OF UTERUS  1976   EYE SURGERY Bilateral 2016   cataract removal   KNEE  ARTHROSCOPY W/ MENISCECTOMY Bilateral X2  LEFT /    X1  RIGHT   KNEE OPEN LATERAL RELEASE Bilateral    MOHS SURGERY Left 12/15/2017   Melanoma in situ - left calf - [facility]   ORIF ANKLE FRACTURE Right 08/04/2020   Procedure: OPEN REDUCTION INTERNAL FIXATION (ORIF) ANKLE FRACTURE;  Surgeon: Toni Arthurs, MD;  Location: [facility];  Service: Orthopedics;  Laterality: Right;  Mini C-arm, [employer] Small Frag   REPLACEMENT TOTAL KNEE Left 2006   SHOULDER ARTHROSCOPY WITH SUBACROMIAL DECOMPRESSION, ROTATOR CUFF REPAIR AND BICEP TENDON REPAIR Right 05/23/2013   Procedure: RIGHT SHOULDER ARTHROSCOPY EXAM UNDER ANESTHESIA  WITH SUBACROMIAL DECOMPRESSION,DISTAL CLAVICLE RESECTION, SADLABRAL DEBRIDEMENT CHONDROPLASTY, BICEP TENOTOMY ;  Surgeon: Eugenia Mcalpine, MD;  Location: 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      UPPER GASTROINTESTINAL ENDOSCOPY     UPPER GI ENDOSCOPY     VAGINAL HYSTERECTOMY  1976    Family History  Problem Relation Age of Onset   Cancer Mother    Heart disease Mother    Hypertension Mother    Melanoma Mother 44   Obesity Mother    Heart disease Father 34   Hypertension Father    Esophageal cancer Brother    Dementia Paternal Grandfather    Melanoma Niece 56   Colon cancer Neg Hx    Colon polyps Neg Hx    Stomach cancer Neg Hx    Rectal cancer Neg Hx     Allergies  Allergen  Reactions   Cuprimine  [Penicillamine] Anaphylaxis   Nsaids Anaphylaxis and Other (See Comments)    Severe stomach cramping Mouth sores   Otezla [Apremilast] Anaphylaxis    Suicidal ideation   Penicillins Anaphylaxis   Erythromycin Other (See Comments)    SEVERE STOMACH CRAMPS   Morphine And Codeine Nausea And Vomiting   Remicade [Infliximab] Other (See Comments) and Hypertension    Shut down immune system     Skyrizi [Risankizumab] Other (See Comments) and Cough    Upper respiratory infection Urinary tract infection Fever   Tolectin [Tolmetin] Other (See Comments)    Severe stomach cramping   Nickel Rash    Including snaps on hospital gowns     Current Outpatient Medications on File Prior to Visit  Medication Sig Dispense Refill   acetaminophen (TYLENOL) 325 MG tablet Take 2 tablets (650 mg total) by mouth every 4 (four) hours as needed for mild pain (pain score 1-3) (or Fever >/= 101).     albuterol (VENTOLIN HFA) 108 (90 Base) MCG/ACT inhaler INHALE 2 PUFFS INTO THE LUNGS EVERY 6 (SIX) HOURS AS NEEDED FOR WHEEZING OR SHORTNESS OF BREATH (COUGH ). START WITH USING IN AM, AROUND NOON AND 1 HOUR BEFORE BEDTIME TO HELP COUGH SYMPTOMS. 8.5 each 1   atorvastatin (LIPITOR) 40 MG tablet TAKE 1 TABLET BY MOUTH EVERY DAY 90 tablet 3   buPROPion ER (WELLBUTRIN SR) 100 MG 12 hr tablet Take 1 tablet (100 mg total) by mouth at bedtime. 30 tablet 1   cyclobenzaprine (FLEXERIL) 5 MG tablet Take 1 tablet (5 mg total) by mouth 3 (three) times daily as needed for muscle spasms. 30 tablet 0   HYDROcodone-acetaminophen (NORCO/VICODIN) 5-325 MG tablet Take 2 tablets by mouth every 4 (four) hours as needed for moderate pain (pain score 4-6) or severe pain (pain score 7-10). 30 tablet 0   losartan (COZAAR) 100 MG tablet TAKE 1 TABLET BY MOUTH EVERY DAY 90 tablet 3   metoprolol tartrate (LOPRESSOR) 25 MG tablet Take 0.5 tablets (12.5 mg total) by mouth 2 (two) times daily. 90 tablet 3   omeprazole (PRILOSEC) 40 MG capsule TAKE  1 CAPSULE BY MOUTH 2 (TWO) TIMES DAILY. TAKE 30 MINUTES BEFORE BREAKFAST & DINNER. (Patient taking differently: Take 40 mg by mouth daily.) 180 capsule 1   polyethylene glycol (MIRALAX / GLYCOLAX) 17 g packet Take 17 g by mouth daily as needed for moderate constipation. 14 each 0   senna-docusate (SENOKOT-S) 8.6-50 MG tablet Take 1 tablet by mouth at bedtime as needed for mild constipation. 30 tablet 0   sulfamethoxazole-trimethoprim (BACTRIM,SEPTRA) 400-80 MG tablet Take 1 tablet by mouth at bedtime.     tamoxifen (NOLVADEX) 10 MG tablet Take 1/2 tablet by mouth in the evening 90 tablet 1   traZODone (DESYREL) 50 MG tablet Take 1 tablet (50 mg total) by mouth at bedtime as needed for sleep. 30 tablet 0   venlafaxine XR (EFFEXOR-XR) 150 MG 24 hr capsule Take 1 capsule (150 mg total) by mouth daily. 90 capsule 0   No current facility-administered medications on file prior to visit.    BP 120/88   Pulse 84   Temp 97.8 F (36.6 C) (Oral)   Ht 5\' 6"  (1.676 m)   Wt 224 lb (101.6 kg)   SpO2 96%   BMI 36.15 kg/m       Objective:   Physical Exam Vitals and nursing note reviewed.  Constitutional:      Appearance: Normal appearance.  HENT:  Head:   Cardiovascular:     Rate and Rhythm: Normal rate and regular rhythm.     Pulses: Normal pulses.     Heart sounds: Normal heart sounds.  Pulmonary:     Effort: Pulmonary effort is normal.     Breath sounds: Normal breath sounds.  Musculoskeletal:        General: Normal range of motion.  Skin:    General: Skin is warm and dry.  Neurological:     General: No focal deficit present.     Mental Status: She is alert and oriented to person, place, and time.  Psychiatric:        Mood and Affect: Mood normal.        Behavior: Behavior normal.        Thought Content: Thought content normal.        Judgment: Judgment normal.       Assessment & Plan:  1. Compression fracture of T2 vertebra with routine healing (Primary) - -Reviewed  hospital notes, discharge instructions, labs, imaging, and medication changes if any.  All questions answered to the best of my ability - Continue to work with PT/OT   2. Compression fracture of T6 vertebra with routine healing, subsequent encounter - Continue to work with PT/OT   3. Closed compression fracture of body of L1 vertebra (HCC) - Continue to work with PT/OT   4. Primary hypertension - Well controlled.   5. Mixed hyperlipidemia - Continue statin   6. Atrial fibrillation, unspecified type (HCC) - Continue with Metoprolol  - Continue to hold anticoagulation   7. Gait instability - Continue to use walker. Work with PT/OT   8. Hematoma - Dried blood noted. Ok to wash hair and let warm water run over hematoma   Shirline Frees, NP

## 2023-05-12 DIAGNOSIS — S22058D Other fracture of T5-T6 vertebra, subsequent encounter for fracture with routine healing: Secondary | ICD-10-CM | POA: Diagnosis not present

## 2023-05-12 DIAGNOSIS — S32010D Wedge compression fracture of first lumbar vertebra, subsequent encounter for fracture with routine healing: Secondary | ICD-10-CM | POA: Diagnosis not present

## 2023-05-12 DIAGNOSIS — F32A Depression, unspecified: Secondary | ICD-10-CM | POA: Diagnosis not present

## 2023-05-12 DIAGNOSIS — Z853 Personal history of malignant neoplasm of breast: Secondary | ICD-10-CM | POA: Diagnosis not present

## 2023-05-12 DIAGNOSIS — M8580 Other specified disorders of bone density and structure, unspecified site: Secondary | ICD-10-CM | POA: Diagnosis not present

## 2023-05-12 DIAGNOSIS — M51369 Other intervertebral disc degeneration, lumbar region without mention of lumbar back pain or lower extremity pain: Secondary | ICD-10-CM | POA: Diagnosis not present

## 2023-05-12 DIAGNOSIS — I480 Paroxysmal atrial fibrillation: Secondary | ICD-10-CM | POA: Diagnosis not present

## 2023-05-12 DIAGNOSIS — Z87891 Personal history of nicotine dependence: Secondary | ICD-10-CM | POA: Diagnosis not present

## 2023-05-12 DIAGNOSIS — Z9181 History of falling: Secondary | ICD-10-CM | POA: Diagnosis not present

## 2023-05-12 DIAGNOSIS — M19011 Primary osteoarthritis, right shoulder: Secondary | ICD-10-CM | POA: Diagnosis not present

## 2023-05-12 DIAGNOSIS — F419 Anxiety disorder, unspecified: Secondary | ICD-10-CM | POA: Diagnosis not present

## 2023-05-12 DIAGNOSIS — D509 Iron deficiency anemia, unspecified: Secondary | ICD-10-CM | POA: Diagnosis not present

## 2023-05-12 DIAGNOSIS — E785 Hyperlipidemia, unspecified: Secondary | ICD-10-CM | POA: Diagnosis not present

## 2023-05-12 DIAGNOSIS — S82851D Displaced trimalleolar fracture of right lower leg, subsequent encounter for closed fracture with routine healing: Secondary | ICD-10-CM | POA: Diagnosis not present

## 2023-05-12 DIAGNOSIS — S22028D Other fracture of second thoracic vertebra, subsequent encounter for fracture with routine healing: Secondary | ICD-10-CM | POA: Diagnosis not present

## 2023-05-12 DIAGNOSIS — G4700 Insomnia, unspecified: Secondary | ICD-10-CM | POA: Diagnosis not present

## 2023-05-12 DIAGNOSIS — I10 Essential (primary) hypertension: Secondary | ICD-10-CM | POA: Diagnosis not present

## 2023-05-12 DIAGNOSIS — K219 Gastro-esophageal reflux disease without esophagitis: Secondary | ICD-10-CM | POA: Diagnosis not present

## 2023-05-12 DIAGNOSIS — Z86718 Personal history of other venous thrombosis and embolism: Secondary | ICD-10-CM | POA: Diagnosis not present

## 2023-05-12 DIAGNOSIS — Z79891 Long term (current) use of opiate analgesic: Secondary | ICD-10-CM | POA: Diagnosis not present

## 2023-05-12 DIAGNOSIS — K5901 Slow transit constipation: Secondary | ICD-10-CM | POA: Diagnosis not present

## 2023-05-12 DIAGNOSIS — F39 Unspecified mood [affective] disorder: Secondary | ICD-10-CM | POA: Diagnosis not present

## 2023-05-12 DIAGNOSIS — Z860100 Personal history of colon polyps, unspecified: Secondary | ICD-10-CM | POA: Diagnosis not present

## 2023-05-12 DIAGNOSIS — R296 Repeated falls: Secondary | ICD-10-CM | POA: Diagnosis not present

## 2023-05-17 DIAGNOSIS — L988 Other specified disorders of the skin and subcutaneous tissue: Secondary | ICD-10-CM | POA: Diagnosis not present

## 2023-05-18 DIAGNOSIS — I480 Paroxysmal atrial fibrillation: Secondary | ICD-10-CM | POA: Diagnosis not present

## 2023-05-18 DIAGNOSIS — S22028D Other fracture of second thoracic vertebra, subsequent encounter for fracture with routine healing: Secondary | ICD-10-CM | POA: Diagnosis not present

## 2023-05-18 DIAGNOSIS — F39 Unspecified mood [affective] disorder: Secondary | ICD-10-CM | POA: Diagnosis not present

## 2023-05-18 DIAGNOSIS — S22058D Other fracture of T5-T6 vertebra, subsequent encounter for fracture with routine healing: Secondary | ICD-10-CM | POA: Diagnosis not present

## 2023-05-18 DIAGNOSIS — S32010D Wedge compression fracture of first lumbar vertebra, subsequent encounter for fracture with routine healing: Secondary | ICD-10-CM | POA: Diagnosis not present

## 2023-05-18 DIAGNOSIS — I10 Essential (primary) hypertension: Secondary | ICD-10-CM | POA: Diagnosis not present

## 2023-05-19 ENCOUNTER — Encounter: Payer: Self-pay | Admitting: Pulmonary Disease

## 2023-05-19 ENCOUNTER — Other Ambulatory Visit: Payer: Self-pay | Admitting: Adult Health

## 2023-05-19 ENCOUNTER — Ambulatory Visit: Payer: Medicare Other | Admitting: Pulmonary Disease

## 2023-05-19 VITALS — BP 128/84 | HR 82 | Ht 66.0 in | Wt 242.0 lb

## 2023-05-19 DIAGNOSIS — D649 Anemia, unspecified: Secondary | ICD-10-CM

## 2023-05-19 DIAGNOSIS — R0609 Other forms of dyspnea: Secondary | ICD-10-CM | POA: Diagnosis not present

## 2023-05-19 DIAGNOSIS — J432 Centrilobular emphysema: Secondary | ICD-10-CM

## 2023-05-19 MED ORDER — ANORO ELLIPTA 62.5-25 MCG/ACT IN AEPB: 1.0000 | INHALATION_SPRAY | Freq: Every day | RESPIRATORY_TRACT | 3 refills | Status: DC

## 2023-05-19 NOTE — Progress Notes (Signed)
 @Patient  ID: Tonya Bartlett, female    DOB: 1945-11-07, 78 y.o.   MRN: [id number]  Chief Complaint  Patient presents with   Consult    Pt states has has SOB, horseness    Referring provider: Kennon Rounds  HPI:   78 year old woman whom are seen for evaluation of dyspnea on exertion.  Multiple cardiology notes reviewed.  Multiple notes including discharge summary from recent hospitalization after fall reviewed.  Patient has had dyspnea for several months.  Ongoing workup per cardiology.  TTE 02/2023 revealed dilated left atrium, mitral valve regurgitation, aortic insufficiency.  A cardiac PET scan demonstrated an area of mild ischemia, low risk, in the mid anterior location that was reversible.  Reportedly low risk testing and clinically felt like this was maybe not as important.  Referred to pulmonary for further evaluation.  The CT scan of the chest was also obtained.  This on my review interpretation reveals clear lungs.  I really do not see any emphysema but it was reported as "mild" on the report.  Chest history with sound like asthma in the past.  But no real symptoms for 20+ years.  No cough.  No substernal chest pain or pressure with exertion that she can recall.  Questionaires / Pulmonary Flowsheets:   ACT:      No data to display          MMRC:     No data to display          Epworth:      No data to display          Tests:   FENO:  No results found for: "NITRICOXIDE"  PFT:     No data to display          WALK:      No data to display          Imaging: Personally reviewed No results found.  Lab Results: Personally reviewed CBC    Component Value Date/Time   WBC 7.3 04/17/2023 0554   RBC 2.20 (L) 04/17/2023 0554   HGB 7.5 (L) 04/17/2023 0554   HGB 11.7 09/23/2022 1106   HCT 22.2 (L) 04/17/2023 0554   HCT 34.9 09/23/2022 1106   PLT 156 04/17/2023 0554   PLT 255 09/23/2022 1106   MCV 100.9 (H) 04/17/2023 0554   MCV  100 (H) 09/23/2022 1106   MCH 34.1 (H) 04/17/2023 0554   MCHC 33.8 04/17/2023 0554   RDW 12.6 04/17/2023 0554   RDW 12.0 09/23/2022 1106   LYMPHSABS 1.2 04/17/2023 0554   LYMPHSABS 1.1 08/03/2022 1116   MONOABS 0.9 04/17/2023 0554   EOSABS 0.2 04/17/2023 0554   EOSABS 0.4 08/03/2022 1116   BASOSABS 0.1 04/17/2023 0554   BASOSABS 0.1 08/03/2022 1116    BMET    Component Value Date/Time   NA 135 04/17/2023 0554   NA 142 09/23/2022 1106   K 3.7 04/17/2023 0554   CL 100 04/17/2023 0554   CO2 24 04/17/2023 0554   GLUCOSE 105 (H) 04/17/2023 0554   BUN 6 (L) 04/17/2023 0554   BUN 12 09/23/2022 1106   CREATININE 0.62 04/17/2023 0554   CREATININE 0.65 01/15/2021 1245   CREATININE 0.68 08/30/2015 1531   CALCIUM 8.3 (L) 04/17/2023 0554   GFRNONAA >60 04/17/2023 0554   GFRNONAA >60 01/15/2021 1245   GFRAA >60 08/02/2019 0306    BNP No results found for: "BNP"  ProBNP    Component Value Date/Time  PROBNP 219 01/20/2023 1059    Specialty Problems       Pulmonary Problems   Allergic rhinitis   Qualifier: Diagnosis of  By: Tawanna Cooler MD, Eugenio Hoes        Allergies  Allergen Reactions   Cuprimine [Penicillamine] Anaphylaxis   Nsaids Anaphylaxis and Other (See Comments)    Severe stomach cramping Mouth sores   Otezla [Apremilast] Anaphylaxis    Suicidal ideation   Penicillins Anaphylaxis   Erythromycin Other (See Comments)    SEVERE STOMACH CRAMPS   Morphine And Codeine Nausea And Vomiting   Remicade [Infliximab] Other (See Comments) and Hypertension    Shut down immune system     Skyrizi [Risankizumab] Other (See Comments) and Cough    Upper respiratory infection Urinary tract infection Fever   Tolectin [Tolmetin] Other (See Comments)    Severe stomach cramping   Nickel Rash    Including snaps on hospital gowns     Immunization History  Administered Date(s) Administered   Fluad Quad(high Dose 65+) 10/31/2018, 04/19/2021, 12/17/2021   Fluad Trivalent(High  Dose 65+) 11/17/2022   Influenza Split 11/17/2011   Influenza Whole 10/15/2009   Influenza, High Dose Seasonal PF 01/02/2014, 11/06/2014, 11/15/2015, 11/05/2016, 12/02/2017   Influenza-Unspecified 10/17/2012   PFIZER(Purple Top)SARS-COV-2 Vaccination 03/25/2019, 04/18/2019, 11/03/2019, 12/17/2021   PNEUMOCOCCAL CONJUGATE-20 04/19/2021   Pfizer Covid-19 Vaccine Bivalent Booster 65yrs & up 04/19/2021   Pneumococcal Conjugate-13 01/02/2014   Pneumococcal Polysaccharide-23 10/15/2009   Td 08/24/2006   Tdap 07/31/2020   Zoster Recombinant(Shingrix) 12/07/2018, 02/15/2019   Zoster, Live 02/23/2015    Past Medical History:  Diagnosis Date   Alcohol abuse    Sober 41 years.   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  1996   History of hiatal hernia    History of iron deficiency anemia 1996   iron infusion   History of migraine headaches    History of radiation therapy    left breast 03/27/2021-04/23/2021 Dr Antony Blackbird   History of rib fracture    Hyperlipidemia    Hypertension    IBS (irritable bowel syndrome)    Insomnia    Joint pain    MCI (mild cognitive impairment)    Melanoma (HCC) 2019   left leg    Migraine headache    hx of migraines    OA (osteoarthritis)    RIGHT SHOULDER   Osteopenia    Personal history of radiation therapy    Pneumonia    remote history   PONV (postoperative nausea and vomiting)    Recovering alcoholic (HCC)    SINCE  01-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   Right rotator cuff tear    SOB (shortness of breath) on exertion    Substance abuse (HCC)    recovering alcoholic   Swallowing  difficulty    Unspecified essential hypertension     Tobacco History: Social History   Tobacco Use  Smoking Status Former   Current packs/day: 0.00   Average packs/day: 0.5 packs/day for 15.0 years (7.5 ttl pk-yrs)   Types: Cigarettes   Start date: 05/16/1970   Quit date: 05/15/1985   Years since quitting: 38.0  Smokeless Tobacco Never  Tobacco Comments   Former smoker 12/10/22   Counseling given: Not Answered Tobacco comments: Former smoker 12/10/22   Continue to not smoke  Outpatient Encounter Medications as of 05/19/2023  Medication Sig   acetaminophen (TYLENOL) 325 MG tablet Take 2 tablets (650 mg total) by mouth every 4 (four) hours as needed for mild pain (pain score 1-3) (or Fever >/= 101).   albuterol (VENTOLIN HFA) 108 (90 Base) MCG/ACT inhaler INHALE 2 PUFFS INTO THE LUNGS EVERY 6 (SIX) HOURS AS NEEDED FOR WHEEZING OR SHORTNESS OF BREATH (COUGH ). START WITH USING IN AM, AROUND NOON AND 1 HOUR BEFORE BEDTIME TO HELP COUGH SYMPTOMS.   atorvastatin (LIPITOR) 40 MG tablet TAKE 1 TABLET BY MOUTH EVERY DAY   buPROPion ER (WELLBUTRIN SR) 100 MG 12 hr tablet Take 1 tablet (100 mg total) by mouth at bedtime.   cyclobenzaprine (FLEXERIL) 5 MG tablet Take 1 tablet (5 mg total) by mouth 3 (three) times daily as needed for muscle spasms.   HYDROcodone-acetaminophen (NORCO/VICODIN) 5-325 MG tablet Take 2 tablets by mouth every 4 (four) hours as needed for moderate pain (pain score 4-6) or severe pain (pain score 7-10).   losartan (COZAAR) 100 MG tablet TAKE 1 TABLET BY MOUTH EVERY DAY   metoprolol tartrate (LOPRESSOR) 25 MG tablet Take 0.5 tablets (12.5 mg total) by mouth 2 (two) times daily.   omeprazole (PRILOSEC) 40 MG capsule TAKE 1 CAPSULE BY MOUTH 2 (TWO) TIMES DAILY. TAKE 30 MINUTES BEFORE BREAKFAST & DINNER. (Patient taking differently:  Take 40 mg by mouth daily.)   polyethylene glycol (MIRALAX / GLYCOLAX) 17 g packet Take 17 g by mouth daily as needed for moderate constipation.   senna-docusate (SENOKOT-S) 8.6-50 MG tablet Take 1 tablet by mouth at bedtime as needed for mild constipation.   sulfamethoxazole-trimethoprim (BACTRIM,SEPTRA) 400-80 MG tablet Take 1 tablet by mouth at bedtime.   tamoxifen (NOLVADEX) 10 MG tablet Take 1/2 tablet by mouth in the evening   traZODone (DESYREL) 50 MG tablet Take 1 tablet (50 mg total) by mouth at bedtime as needed for sleep.   umeclidinium-vilanterol (ANORO ELLIPTA) 62.5-25 MCG/ACT AEPB Inhale 1 puff into the lungs daily.   venlafaxine XR (EFFEXOR-XR) 150 MG 24 hr capsule Take 1 capsule (150 mg total) by mouth daily.   No facility-administered encounter medications on file as of 05/19/2023.     Review of Systems  Review of Systems  No chest pain with exertion.  No orthopnea or PND.  Comprehensive review of systems otherwise negative. Physical Exam  BP 128/84 (BP Location: Right Arm, Patient Position: Sitting, Cuff Size: Normal)   Pulse 82   Ht 5\' 6"  (1.676 m)   Wt 242 lb (109.8 kg)   SpO2 94%   BMI 39.06 kg/m   Wt Readings from Last 5 Encounters:  05/19/23 242 lb (109.8 kg)  05/11/23 224 lb (101.6 kg)  04/14/23 230 lb (104.3 kg)  04/08/23 231 lb (104.8 kg)  03/26/23 234 lb (106.1 kg)    BMI Readings from Last 5 Encounters:  05/19/23 39.06 kg/m  05/11/23 36.15 kg/m  04/14/23 37.12 kg/m  04/08/23 37.28  kg/m  03/26/23 37.77 kg/m     Physical Exam General: Sitting in chair, no acute distress Eyes: EOMI, no icterus Neck: Supple, no JVP Pulmonary: Clear, no work of breathing Cardiovascular: Warm, no edema Abdomen: Still MSK: No synovitis no joints effusion Neuro: Ambulates with a rollator, moves all extremities Psych: Normal mood, full affect   Assessment & Plan:   Dyspnea on exertion: High suspicion for primary cardiac driver of symptoms with left  atrial dilation, MVR, AI on TTE 02/2023, all of which will worsen with normal physiology of exercise.  Her CT scan demonstrates minimal if any of emphysema.  Frankly I do not really see any to my eye.  Notably she had a area of reversible ischemia on cardiac PET scan 02/2023 albeit at low risk.  Trial of Anoro, bronchodilator therapy, given report of emphysema on CT scan to see if any therapeutic relief.  PFTs for further evaluation.  If PFTs are normal would reconsider further evaluation of cardiac PET scan results.  Notably she has significantly worsened anemia along recent admission about 1 month ago.  Suspect this was contributing to some level but symptoms preceded the onset of anemia and she did not report any worsening since hospitalization.  Emphysema: Mild to present at all, minimal to my eye.  Trial of Anoro as above.  Anemia: Hemoglobin nadir 7.5 from baseline normal greater than 13 on admission recent hospitalization.  Symptoms preceded hospitalization have not worsened since which is a bit surprising given significant change in hemoglobin.  Will message PCP to ensure follow-up.  Return in about 4 weeks (around 06/16/2023) for f/u Dr. Judeth Horn.   Karren Burly, MD 05/19/2023   This appointment required 60 minutes of patient care (this includes precharting, chart review, review of results, face-to-face care, etc.).

## 2023-05-19 NOTE — Patient Instructions (Addendum)
 Try Anoro 1 puff once a day.  See if this helps with some of the shortness of breath.  Let me know if is too expensive or  if there are any side effects or adverse reactions with this new inhaler  Pulmonary function test ordered to further evaluate if the lungs could be contributing.  Your CT scan was quite clear, there is minimal emphysema is present at all, I am not sure the lungs are contributing blood sleep we can build the case 1 way or another.  Return clinic in 4 weeks after PFT (scheduled next available) with Dr. Judeth Horn

## 2023-05-21 DIAGNOSIS — S22028D Other fracture of second thoracic vertebra, subsequent encounter for fracture with routine healing: Secondary | ICD-10-CM | POA: Diagnosis not present

## 2023-05-21 DIAGNOSIS — S32010D Wedge compression fracture of first lumbar vertebra, subsequent encounter for fracture with routine healing: Secondary | ICD-10-CM | POA: Diagnosis not present

## 2023-05-21 DIAGNOSIS — I480 Paroxysmal atrial fibrillation: Secondary | ICD-10-CM | POA: Diagnosis not present

## 2023-05-21 DIAGNOSIS — S22058D Other fracture of T5-T6 vertebra, subsequent encounter for fracture with routine healing: Secondary | ICD-10-CM | POA: Diagnosis not present

## 2023-05-21 DIAGNOSIS — F39 Unspecified mood [affective] disorder: Secondary | ICD-10-CM | POA: Diagnosis not present

## 2023-05-21 DIAGNOSIS — I10 Essential (primary) hypertension: Secondary | ICD-10-CM | POA: Diagnosis not present

## 2023-05-24 DIAGNOSIS — I10 Essential (primary) hypertension: Secondary | ICD-10-CM | POA: Diagnosis not present

## 2023-05-24 DIAGNOSIS — S22058D Other fracture of T5-T6 vertebra, subsequent encounter for fracture with routine healing: Secondary | ICD-10-CM | POA: Diagnosis not present

## 2023-05-24 DIAGNOSIS — S32010D Wedge compression fracture of first lumbar vertebra, subsequent encounter for fracture with routine healing: Secondary | ICD-10-CM | POA: Diagnosis not present

## 2023-05-24 DIAGNOSIS — I480 Paroxysmal atrial fibrillation: Secondary | ICD-10-CM | POA: Diagnosis not present

## 2023-05-24 DIAGNOSIS — S22028D Other fracture of second thoracic vertebra, subsequent encounter for fracture with routine healing: Secondary | ICD-10-CM | POA: Diagnosis not present

## 2023-05-24 DIAGNOSIS — F39 Unspecified mood [affective] disorder: Secondary | ICD-10-CM | POA: Diagnosis not present

## 2023-05-24 NOTE — Progress Notes (Addendum)
 Cardiology Office Note:  .   Date:  05/24/2023  ID:  Tonya Bartlett, DOB 1945/10/11, MRN [id number] PCP: Alto Atta, NP  [facility] Providers Cardiologist:  Lauro Portal, MD Electrophysiologist:  Boyce Byes, MD {  History of Present Illness: .   Tonya Bartlett is a 78 y.o. female w/PMHx of breast Ca (hx of XRT), HTN, AFib, hx of DVT  Referred to Dr. Marven Slimmer May 2024 for rhythm control management starategies as well as discussion of watchman given cost of her OAC.  Despite escalating dosing of her BB continued to have symptomatic Afib episodes Discussed both PVI/afib ablation as well as LAA occusion procedure, she wished to proceed with both  Saw the AFib clinic as usual, reported some elevated HRs but not clearly her symptoms of Afib  ER visit after a trip/fall 01/08/23, did hit her head, CT was OK  Marlea Silvers, PA-C 01/20/23, c/o DOE, noted at her ER visit possibly some findings of increased vascular prominence, poss p.HTN. some concerns her DOE could be an anginal equivalent, not overtly felt to be volume OL Planned for echo and stress PET given coronary Ca++ Maintaining SR  I saw her 02/05/23 Less palpitations C/o DOE, new/worse post ablation Falls, she attributed to her vertigo, using a cane (but lost it Planned for stress PET (via cards team) Sent for CT PV protocol   She saw S. WEaver, PA 03/23/23,  Myocardial PET 03/10/2023: Small area of ischemia mid anterior wall, no infarction, EF 70, myocardial blood flow reserve normal; low risk  Felt to be a very small territory of ischemia and referred to [facility] given CT findings (Emphysema and chronic changes), felt to explain her SOB as well as some weight gain  Admitted 04/14/23 with falls, >> progressive back pain T2, T6 and L1 compression fractures  Falls described as mechanical  trauma surgery team recommended conservative management was advised.  Eliquis  held to f/u with cardiology 2/2 falls Discharged  04/20/23 to SNF/rehab  NOTE: Hgb 10.4 > 10.2 > 8.1 > 7.5 at discharge, not specifically discussed in her d/c summary  Saw Dr Marygrace Snellen yesterday he did not feel the CT noted any significant emphysema more suspicious of cardiac driver of her SOB, planned for Trial of Anoro, bronchodilator therapy and PFTs, if normal recommends further evaluation into her abnormal PET findings ANEMIC > planned to reach out to her PMD to ensure follow up (SOB preceded development of anemia)  Today's visit is scheduled as a planned f/u visit  ROS:   She is uncomfortable going back on a/c She has what has been called a hematoma on her head (seen by IM NP > advised [place] of the area > 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  She did have a misstep at home back in Nov fell and hit her head did require 2 ER trips to manage it/bleeding.  She doesn't think she has had Afib,  But gets high HRs sometimes No CP SOB the same as it was at her last visit  Arrhythmia/AAD hx Afib diagnosed Dec 2022 PVI ablation 10/13/22  Studies Reviewed: Aaron Aas    EKG done today and reviewed by myself SR/sinus arrhythmia, PACs, 88bpm   10/13/22: EPS/ablation CONCLUSIONS: 1. Successful PVI  2. Successful ablation/isolation of the posterior wall 3. Intracardiac echo reveals trivial pericardial effusion, large accessory vein exiting the posterior wall adjacent to the right-sided carina 4. No early apparent complications. 5. Colchicine  0.6mg  PO BID x 5 days 6. Protonix  40mg  PO daily x 45 days  12/06/20: TTE 1. Left ventricular ejection fraction by 3D volume is 73 %. The left  ventricle has hyperdynamic function. The left ventricle has no regional  wall motion abnormalities. Left ventricular diastolic  parameters are  consistent with Grade I diastolic  dysfunction (impaired relaxation). The average left ventricular global  longitudinal strain is -25.1 %. The global longitudinal strain is normal.   2. Right ventricular systolic function is normal. The right ventricular  size is normal.   3. Left atrial size was mildly dilated.   4. The mitral valve is normal in structure. Mild mitral valve  regurgitation. No evidence of mitral stenosis.   5. The aortic valve is tricuspid. There is mild calcification of the  aortic valve. There is mild thickening of the aortic valve. Aortic valve  regurgitation is not visualized. Mild to moderate aortic valve  sclerosis/calcification is present, without any  evidence of aortic stenosis.   6. The inferior vena cava is normal in size with greater than 50%  respiratory variability, suggesting right atrial pressure of 3 mmHg.    Risk Assessment/Calculations:    Physical Exam:   VS:  There were no vitals taken for this visit.   Wt Readings from Last 3 Encounters:  05/19/23 242 lb (109.8 kg)  05/11/23 224 lb (101.6 kg)  04/14/23 230 lb (104.3 kg)    GEN: Well nourished, well developed in no acute distress NECK: No JVD; No carotid bruits CARDIAC: irregular/extrasystoles, no murmurs, no rubs, gallops, heart sounds are not distant RESPIRATORY: CTA b/l without rales, wheezing or rhonchi  ABDOMEN: Soft, non-tender, non-distended EXTREMITIES: No edema; No deformity    ASSESSMENT AND PLAN: .    paroxysmal AFib CHA2DS2Vsc is 6, on eliquis , held at discharge w/concerns of recurrent falls She reports this last event not he fault though has had falls Falls, typically uses a cane, comes with a walker today  She is uncomfortable resuming anticoagulation She was told she can not have LAA occlusion device given her nickel allergy >> she would like to know if we can look into gold plated/made device  Discussed management strategies going forward if unable to  get watchman, or anticoagulate She would be open to ILR for Afib surveillance to help guide management strategy She has a lot of ectopy, sinus arrhythmia today Though she has not taken her BB yet today either (reports taking 25mg  BID for quite a long time, not 12.5mg )  Will review with Dr. Marven Slimmer  Addend: In Dr. Candace Cerise review No gold plated devices like they do with pacemakers/defibrillators.  I think a loop would be reasonable. May encounter false positives with the ectopy. If symptomatic, AAD is OK with me. Otherwise, can continue to monitor.  Given this: And no overt symptoms reported with her ectopy and no symptoms of AFib Will plan to continue to monitor without changes. Mertha Abrahams, PA-C  06/14/23   HTN No meds yet today  Secondary hypercoagulable state 2/2 AFib  4. 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 Marven Slimmer     Signed, Debbie Fails, PA-C

## 2023-05-25 ENCOUNTER — Encounter: Payer: Self-pay | Admitting: Physician Assistant

## 2023-05-25 ENCOUNTER — Ambulatory Visit: Attending: Physician Assistant | Admitting: Physician Assistant

## 2023-05-25 VITALS — BP 154/80 | HR 56 | Wt 222.0 lb

## 2023-05-25 DIAGNOSIS — D508 Other iron deficiency anemias: Secondary | ICD-10-CM | POA: Diagnosis not present

## 2023-05-25 DIAGNOSIS — I10 Essential (primary) hypertension: Secondary | ICD-10-CM | POA: Diagnosis not present

## 2023-05-25 DIAGNOSIS — L4052 Psoriatic arthritis mutilans: Secondary | ICD-10-CM | POA: Diagnosis not present

## 2023-05-25 DIAGNOSIS — I480 Paroxysmal atrial fibrillation: Secondary | ICD-10-CM

## 2023-05-25 DIAGNOSIS — G894 Chronic pain syndrome: Secondary | ICD-10-CM | POA: Diagnosis not present

## 2023-05-25 DIAGNOSIS — M47812 Spondylosis without myelopathy or radiculopathy, cervical region: Secondary | ICD-10-CM | POA: Diagnosis not present

## 2023-05-25 DIAGNOSIS — M150 Primary generalized (osteo)arthritis: Secondary | ICD-10-CM | POA: Diagnosis not present

## 2023-05-25 DIAGNOSIS — D6869 Other thrombophilia: Secondary | ICD-10-CM

## 2023-05-25 LAB — CBC
Hematocrit: 31.5 % — ABNORMAL LOW (ref 34.0–46.6)
Hemoglobin: 10.5 g/dL — ABNORMAL LOW (ref 11.1–15.9)
MCH: 33.8 pg — ABNORMAL HIGH (ref 26.6–33.0)
MCHC: 33.3 g/dL (ref 31.5–35.7)
MCV: 101 fL — ABNORMAL HIGH (ref 79–97)
Platelets: 306 10*3/uL (ref 150–450)
RBC: 3.11 x10E6/uL — ABNORMAL LOW (ref 3.77–5.28)
RDW: 12.5 % (ref 11.7–15.4)
WBC: 5.3 10*3/uL (ref 3.4–10.8)

## 2023-05-25 MED ORDER — METOPROLOL TARTRATE 25 MG PO TABS: 25.0000 mg | ORAL_TABLET | Freq: Two times a day (BID) | ORAL | Status: DC

## 2023-05-25 NOTE — Patient Instructions (Signed)
 Medication Instructions:   Your physician recommends that you continue on your current medications as directed. Please refer to the Current Medication list given to you today.   *If you need a refill on your cardiac medications before your next appointment, please call your pharmacy*   Lab Work:   PLEASE GO DOWN STAIRS  [facility]  FIRST FLOOR  [address] ( GET OFF ELEVATORS MAKE A LEFT AND ANOTHER LEFT LAB ON RIGHT DOWN HALLWAY :  CBC TODAY     If you have labs (blood work) drawn today and your tests are completely normal, you will receive your results only by: MyChart Message (if you have MyChart) OR A paper copy in the mail If you have any lab test that is abnormal or we need to change your treatment, we will call you to review the results.  Testing/Procedures: NONE ORDERED  TODAY     Follow-Up: At [facility], you and your health needs are our priority.  As part of our continuing mission to provide you with exceptional heart care, our providers are all part of one team.  This team includes your primary Cardiologist (physician) and Advanced Practice Providers or APPs (Physician Assistants and Nurse Practitioners) who all work together to provide you with the care you need, when you need it.  Your next appointment:    1 -2 month(s)   Provider:    Steffanie Dunn, MD or Francis Dowse, PA-C    We recommend signing up for the patient portal called "MyChart".  Sign up information is provided on this After Visit Summary.  MyChart is used to connect with patients for Virtual Visits (Telemedicine).  Patients are able to view lab/test results, encounter notes, upcoming appointments, etc.  Non-urgent messages can be sent to your provider as well.   To learn more about what you can do with MyChart, go to [email or web address].   Other Instructions       1st Floor: - Lobby - Registration  - Pharmacy  - Lab - Cafe  2nd Floor: - PV Lab - Diagnostic Testing (echo, CT,  nuclear med)  3rd Floor: - Vacant  4th Floor: - TCTS (cardiothoracic surgery) - AFib Clinic - Structural Heart Clinic - Vascular Surgery  - Vascular Ultrasound  5th Floor: - HeartCare Cardiology (general and EP) - Clinical Pharmacy for coumadin, hypertension, lipid, weight-loss medications, and med management appointments    Valet parking services will be available as well.

## 2023-05-26 ENCOUNTER — Other Ambulatory Visit (INDEPENDENT_AMBULATORY_CARE_PROVIDER_SITE_OTHER)

## 2023-05-26 DIAGNOSIS — F39 Unspecified mood [affective] disorder: Secondary | ICD-10-CM | POA: Diagnosis not present

## 2023-05-26 DIAGNOSIS — I480 Paroxysmal atrial fibrillation: Secondary | ICD-10-CM | POA: Diagnosis not present

## 2023-05-26 DIAGNOSIS — D649 Anemia, unspecified: Secondary | ICD-10-CM

## 2023-05-26 DIAGNOSIS — S22028D Other fracture of second thoracic vertebra, subsequent encounter for fracture with routine healing: Secondary | ICD-10-CM | POA: Diagnosis not present

## 2023-05-26 DIAGNOSIS — S22058D Other fracture of T5-T6 vertebra, subsequent encounter for fracture with routine healing: Secondary | ICD-10-CM | POA: Diagnosis not present

## 2023-05-26 DIAGNOSIS — I10 Essential (primary) hypertension: Secondary | ICD-10-CM | POA: Diagnosis not present

## 2023-05-26 DIAGNOSIS — S32010D Wedge compression fracture of first lumbar vertebra, subsequent encounter for fracture with routine healing: Secondary | ICD-10-CM | POA: Diagnosis not present

## 2023-05-26 LAB — CBC
HCT: 32.3 % — ABNORMAL LOW (ref 36.0–46.0)
Hemoglobin: 10.8 g/dL — ABNORMAL LOW (ref 12.0–15.0)
MCHC: 33.6 g/dL (ref 30.0–36.0)
MCV: 101.9 fl — ABNORMAL HIGH (ref 78.0–100.0)
Platelets: 316 10*3/uL (ref 150.0–400.0)
RBC: 3.17 Mil/uL — ABNORMAL LOW (ref 3.87–5.11)
RDW: 13.9 % (ref 11.5–15.5)
WBC: 3.8 10*3/uL — ABNORMAL LOW (ref 4.0–10.5)

## 2023-05-26 LAB — IBC + FERRITIN
Ferritin: 22.3 ng/mL (ref 10.0–291.0)
Iron: 53 ug/dL (ref 42–145)
Saturation Ratios: 14.8 % — ABNORMAL LOW (ref 20.0–50.0)
TIBC: 358.4 ug/dL (ref 250.0–450.0)
Transferrin: 256 mg/dL (ref 212.0–360.0)

## 2023-05-27 ENCOUNTER — Telehealth: Payer: Self-pay

## 2023-05-27 NOTE — Telephone Encounter (Signed)
 Copied from CRM [id number]. Topic: General - Other >> May 27, 2023  9:25 AM Arley Phenix D wrote: Reason for CRM: Patient stated that she had CVC labs done yesterday and wasn't sure if Dr.Nafziger wanted her to make an appointment for a follow up.Patient would like for someone in office to confirm and give her a call back if she needs to make the appointment.

## 2023-06-01 DIAGNOSIS — L988 Other specified disorders of the skin and subcutaneous tissue: Secondary | ICD-10-CM | POA: Diagnosis not present

## 2023-06-02 DIAGNOSIS — S32010D Wedge compression fracture of first lumbar vertebra, subsequent encounter for fracture with routine healing: Secondary | ICD-10-CM | POA: Diagnosis not present

## 2023-06-02 DIAGNOSIS — S22028D Other fracture of second thoracic vertebra, subsequent encounter for fracture with routine healing: Secondary | ICD-10-CM | POA: Diagnosis not present

## 2023-06-02 DIAGNOSIS — I480 Paroxysmal atrial fibrillation: Secondary | ICD-10-CM | POA: Diagnosis not present

## 2023-06-02 DIAGNOSIS — I10 Essential (primary) hypertension: Secondary | ICD-10-CM | POA: Diagnosis not present

## 2023-06-02 DIAGNOSIS — F39 Unspecified mood [affective] disorder: Secondary | ICD-10-CM | POA: Diagnosis not present

## 2023-06-02 DIAGNOSIS — S22058D Other fracture of T5-T6 vertebra, subsequent encounter for fracture with routine healing: Secondary | ICD-10-CM | POA: Diagnosis not present

## 2023-06-04 DIAGNOSIS — I10 Essential (primary) hypertension: Secondary | ICD-10-CM | POA: Diagnosis not present

## 2023-06-04 DIAGNOSIS — I480 Paroxysmal atrial fibrillation: Secondary | ICD-10-CM | POA: Diagnosis not present

## 2023-06-04 DIAGNOSIS — S22058D Other fracture of T5-T6 vertebra, subsequent encounter for fracture with routine healing: Secondary | ICD-10-CM | POA: Diagnosis not present

## 2023-06-04 DIAGNOSIS — S22028D Other fracture of second thoracic vertebra, subsequent encounter for fracture with routine healing: Secondary | ICD-10-CM | POA: Diagnosis not present

## 2023-06-04 DIAGNOSIS — F39 Unspecified mood [affective] disorder: Secondary | ICD-10-CM | POA: Diagnosis not present

## 2023-06-04 DIAGNOSIS — S32010D Wedge compression fracture of first lumbar vertebra, subsequent encounter for fracture with routine healing: Secondary | ICD-10-CM | POA: Diagnosis not present

## 2023-06-09 DIAGNOSIS — I480 Paroxysmal atrial fibrillation: Secondary | ICD-10-CM | POA: Diagnosis not present

## 2023-06-09 DIAGNOSIS — S22028D Other fracture of second thoracic vertebra, subsequent encounter for fracture with routine healing: Secondary | ICD-10-CM | POA: Diagnosis not present

## 2023-06-09 DIAGNOSIS — I10 Essential (primary) hypertension: Secondary | ICD-10-CM | POA: Diagnosis not present

## 2023-06-09 DIAGNOSIS — S32010D Wedge compression fracture of first lumbar vertebra, subsequent encounter for fracture with routine healing: Secondary | ICD-10-CM | POA: Diagnosis not present

## 2023-06-09 DIAGNOSIS — F39 Unspecified mood [affective] disorder: Secondary | ICD-10-CM | POA: Diagnosis not present

## 2023-06-09 DIAGNOSIS — S22058D Other fracture of T5-T6 vertebra, subsequent encounter for fracture with routine healing: Secondary | ICD-10-CM | POA: Diagnosis not present

## 2023-06-10 ENCOUNTER — Ambulatory Visit: Admitting: Adult Health

## 2023-06-10 VITALS — BP 132/76 | HR 60 | Temp 97.7°F | Ht 66.0 in | Wt 222.0 lb

## 2023-06-10 DIAGNOSIS — S22020D Wedge compression fracture of second thoracic vertebra, subsequent encounter for fracture with routine healing: Secondary | ICD-10-CM | POA: Diagnosis not present

## 2023-06-10 DIAGNOSIS — M25562 Pain in left knee: Secondary | ICD-10-CM

## 2023-06-10 DIAGNOSIS — S32010A Wedge compression fracture of first lumbar vertebra, initial encounter for closed fracture: Secondary | ICD-10-CM

## 2023-06-10 DIAGNOSIS — G8929 Other chronic pain: Secondary | ICD-10-CM

## 2023-06-10 DIAGNOSIS — S22050D Wedge compression fracture of T5-T6 vertebra, subsequent encounter for fracture with routine healing: Secondary | ICD-10-CM

## 2023-06-10 NOTE — Progress Notes (Signed)
 Subjective:    Patient ID: Tonya Bartlett, female    DOB: [birth year], 78 y.o.   MRN: [id number]  HPI  77 year old female who  has a past medical history of Alcohol abuse, Allergic rhinitis, Allergy, Anemia, Anxiety, Blood transfusion without reported diagnosis, Breast cancer (HCC) (12/2020), Cancer (HCC), Cataract, Clotting disorder (HCC), Colitis, Complication of anesthesia, Depression, Diverticulosis, Dysrhythmia, Fatigue, Frequency of urination, GERD (gastroesophageal reflux disease), History of colon polyps, History of DVT of lower extremity, History of hiatal hernia, History of iron deficiency anemia (1996), History of migraine headaches, History of radiation therapy, History of rib fracture, Hyperlipidemia, Hypertension, IBS (irritable bowel syndrome), Insomnia, Joint pain, MCI (mild cognitive impairment), Melanoma (HCC) (2019), Migraine headache, OA (osteoarthritis), Osteopenia, Personal history of radiation therapy, Pneumonia, PONV (postoperative nausea and vomiting), Recovering alcoholic (HCC), Right rotator cuff tear, SOB (shortness of breath) on exertion, Substance abuse (HCC), Swallowing difficulty, and Unspecified essential hypertension.  She presents to the office today for multiple issues.   She had a recent hospital admission for compression fracture of T2 vertebra, T6 vertebra and L1 vertebra.  She needs a new referral to be seen at [facility] since it has been multiple years since she has been seen there.  She would like to see Dr. Ellery Guthrie.  She also has a history of left-sided knee replacement x 2.  As of recently she has had worsening pain and swelling in her left knee, pain is located along the lower aspect of her knee.  Pain is worse when extending her knee as well as with walking.  She has had multiple falls landing on her knees and would like an x-ray and MRI to rule out any damage to the knee replacement.  She does see orthopedics at  [facility].   Review of Systems See HPI   Past Medical History:  Diagnosis Date   Alcohol abuse    Sober 41 years.   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  1996   History of hiatal hernia    History of iron deficiency anemia 1996   iron infusion   History of migraine headaches    History of radiation therapy    left breast 03/27/2021-04/23/2021 Dr Retta Caster   History of rib fracture    Hyperlipidemia    Hypertension    IBS (irritable bowel syndrome)    Insomnia    Joint pain    MCI (mild cognitive impairment)    Melanoma (HCC) 2019   left leg    Migraine headache    hx of migraines    OA (osteoarthritis)    RIGHT SHOULDER   Osteopenia    Personal history of radiation therapy    Pneumonia    remote history  PONV (postoperative nausea and vomiting)    Recovering alcoholic (HCC)    SINCE 01-21-1980   Right rotator cuff tear    SOB (shortness of breath) on exertion    Substance abuse (HCC)    recovering alcoholic   Swallowing difficulty    Unspecified essential hypertension     Social History   Socioeconomic History   Marital status: Widowed    Spouse name: Not on file   Number of children: 3   Years of education: Not on file   Highest education level: Master's degree (e.g., MA, MS, MEng, MEd, MSW, MBA)  Occupational History   Occupation: retired Paramedic  Tobacco Use   Smoking status: Former    Current packs/day: 0.00     Average packs/day: 0.5 packs/day for 15.0 years (7.5 ttl pk-yrs)    Types: Cigarettes    Start date: 05/16/1970    Quit date: 05/15/1985    Years since quitting: 38.0   Smokeless tobacco: Never   Tobacco comments:    Former smoker 12/10/22  Vaping Use   Vaping status: Never Used  Substance and Sexual Activity   Alcohol use: No    Comment: RECOVERING ALCOHOLIC--  QUIT [id number]   Drug use: No   Sexual activity: Not Currently    Birth control/protection: Post-menopausal  Other Topics Concern   Not on file  Social History Narrative   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    Widowed    Three kids    6 grandchildren       Social Drivers of Corporate investment banker Strain: Low Risk  (04/18/2021)   Overall Financial Resource Strain (CARDIA)    Difficulty of Paying Living Expenses: Not hard at all  Food Insecurity: No Food Insecurity (04/15/2023)   Hunger Vital Sign    Worried About Running Out of Food in the Last Year: Never true    Ran Out of Food in the Last Year: Never true  Transportation Needs: No Transportation Needs (04/15/2023)   PRAPARE - Administrator, Civil Service (Medical): No    Lack of Transportation (Non-Medical): No  Physical Activity: Inactive (04/18/2021)   Exercise Vital Sign    Days of Exercise per Week: 0 days    Minutes of Exercise per Session: 0 min  Stress: No Stress Concern Present (04/18/2021)   [employer] of Occupational Health - Occupational Stress Questionnaire    Feeling of Stress : Not at all  Social Connections: Moderately Integrated (04/18/2021)   Social Connection and Isolation Panel [NHANES]    Frequency of Communication with Friends and Family: More than three times a week    Frequency of Social Gatherings with Friends and Family: More than three times a week    Attends Religious Services: More than 4 times per year    Active Member of [employer] or Organizations: Yes    Attends Tax inspector Meetings: More than 4 times per year    Marital Status: Widowed  Intimate Partner Violence: Not At Risk (04/15/2023)   Humiliation, Afraid, Rape, and Kick questionnaire    Fear of Current or Ex-Partner: No    Emotionally Abused: No    Physically Abused: No    Sexually Abused: No    Past Surgical History:  Procedure Laterality Date   ATRIAL FIBRILLATION ABLATION N/A 10/13/2022   Procedure: ATRIAL FIBRILLATION ABLATION;  Surgeon: Boyce Byes, MD;  Location: [facility];  Service: Cardiovascular;  Laterality: N/A;   BREAST BIOPSY Left 01/06/2021   pos   BREAST LUMPECTOMY     BREAST LUMPECTOMY WITH RADIOACTIVE SEED LOCALIZATION Left 02/27/2021   Procedure: LEFT BREAST LUMPECTOMY WITH RADIOACTIVE SEED LOCALIZATION;  Surgeon: Lockie Rima, MD;  Location: [facility];  Service: General;  Laterality: Left;   BUNIONECTOMY/  HAMMERTOE CORRECTION  RIGHT FOOT  2011   CATARACT EXTRACTION W/ INTRAOCULAR LENS  IMPLANT, BILATERAL     COLONOSCOPY     DILATION AND CURETTAGE OF UTERUS  1976   EYE SURGERY Bilateral 2016   cataract removal   KNEE ARTHROSCOPY W/ MENISCECTOMY Bilateral X2  LEFT /    X1  RIGHT   KNEE OPEN LATERAL RELEASE Bilateral    MOHS SURGERY Left 12/15/2017   Melanoma in situ - left calf - [facility]   ORIF ANKLE FRACTURE Right 08/04/2020   Procedure: OPEN REDUCTION INTERNAL FIXATION (ORIF) ANKLE FRACTURE;  Surgeon: Amada Backer, MD;  Location: [facility];  Service: Orthopedics;  Laterality: Right;  Mini C-arm, [employer] Small Frag   REPLACEMENT TOTAL KNEE Left 2006   SHOULDER ARTHROSCOPY WITH SUBACROMIAL DECOMPRESSION, ROTATOR CUFF REPAIR AND BICEP TENDON REPAIR Right 05/23/2013   Procedure: RIGHT SHOULDER ARTHROSCOPY EXAM UNDER ANESTHESIA  WITH SUBACROMIAL DECOMPRESSION,DISTAL CLAVICLE RESECTION, SADLABRAL DEBRIDEMENT CHONDROPLASTY, BICEP TENOTOMY ;  Surgeon: Genevie Kerns, MD;  Location: 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      UPPER GASTROINTESTINAL ENDOSCOPY     UPPER GI ENDOSCOPY     VAGINAL HYSTERECTOMY  1976    Family History  Problem Relation Age of Onset   Cancer Mother    Heart disease Mother    Hypertension Mother    Melanoma Mother 75   Obesity Mother    Heart disease Father 52   Hypertension Father    Esophageal cancer Brother    Dementia Paternal Grandfather    Melanoma Niece 34   Colon cancer Neg Hx    Colon polyps Neg Hx    Stomach cancer Neg Hx    Rectal cancer Neg Hx     Allergies  Allergen Reactions   Cuprimine [Penicillamine] Anaphylaxis   Nsaids Anaphylaxis and Other (See Comments)    Severe stomach cramping Mouth sores   Otezla [Apremilast] Anaphylaxis    Suicidal ideation   Penicillins Anaphylaxis   Erythromycin Other (See Comments)    SEVERE STOMACH CRAMPS   Morphine And Codeine Nausea And Vomiting   Remicade  [Infliximab ] Other (See Comments) and Hypertension    Shut down immune system     Skyrizi [Risankizumab] Other (See Comments) and Cough    Upper respiratory infection Urinary tract infection Fever   Tolectin [Tolmetin] Other (See Comments)    Severe stomach cramping   Nickel Rash    Including snaps on hospital gowns     Current Outpatient Medications on File Prior to Visit  Medication Sig Dispense Refill   acetaminophen  (TYLENOL ) 325 MG tablet Take 2  tablets (650 mg total) by mouth every 4 (four) hours as needed for mild pain  (pain score 1-3) (or Fever >/= 101).     albuterol  (VENTOLIN  HFA) 108 (90 Base) MCG/ACT inhaler INHALE 2 PUFFS INTO THE LUNGS EVERY 6 (SIX) HOURS AS NEEDED FOR WHEEZING OR SHORTNESS OF BREATH (COUGH ). START WITH USING IN AM, AROUND NOON AND 1 HOUR BEFORE BEDTIME TO HELP COUGH SYMPTOMS. 8.5 each 1   atorvastatin  (LIPITOR) 40 MG tablet TAKE 1 TABLET BY MOUTH EVERY DAY 90 tablet 3   buPROPion  ER (WELLBUTRIN  SR) 100 MG 12 hr tablet Take 1 tablet (100 mg total) by mouth at bedtime. 30 tablet 1   cyclobenzaprine  (FLEXERIL ) 5 MG tablet Take 1 tablet (5 mg total) by mouth 3 (three) times daily as needed for muscle spasms. 30 tablet 0   HYDROcodone -acetaminophen  (NORCO/VICODIN) 5-325 MG tablet Take 2 tablets by mouth every 4 (four) hours as needed for moderate pain (pain score 4-6) or severe pain (pain score 7-10). 30 tablet 0   losartan  (COZAAR ) 100 MG tablet TAKE 1 TABLET BY MOUTH EVERY DAY 90 tablet 3   metoprolol  tartrate (LOPRESSOR ) 25 MG tablet Take 1 tablet (25 mg total) by mouth 2 (two) times daily.     omeprazole  (PRILOSEC) 40 MG capsule TAKE 1 CAPSULE BY MOUTH 2 (TWO) TIMES DAILY. TAKE 30 MINUTES BEFORE BREAKFAST & DINNER. (Patient taking differently: Take 40 mg by mouth daily.) 180 capsule 1   polyethylene glycol (MIRALAX  / GLYCOLAX ) 17 g packet Take 17 g by mouth daily as needed for moderate constipation. 14 each 0   senna-docusate (SENOKOT-S) 8.6-50 MG tablet Take 1 tablet by mouth at bedtime as needed for mild constipation. 30 tablet 0   sulfamethoxazole -trimethoprim  (BACTRIM ,SEPTRA ) 400-80 MG tablet Take 1 tablet by mouth at bedtime.     tamoxifen  (NOLVADEX ) 10 MG tablet Take 1/2 tablet by mouth in the evening 90 tablet 1   traZODone  (DESYREL ) 50 MG tablet Take 1 tablet (50 mg total) by mouth at bedtime as needed for sleep. 30 tablet 0   umeclidinium-vilanterol (ANORO ELLIPTA ) 62.5-25 MCG/ACT AEPB Inhale 1 puff into the lungs daily. 60 each 3   venlafaxine  XR (EFFEXOR -XR) 150 MG 24 hr capsule  Take 1 capsule (150 mg total) by mouth daily. 90 capsule 0   No current facility-administered medications on file prior to visit.    BP 132/76   Pulse 60   Temp 97.7 F (36.5 C) (Oral)   Ht 5\' 6"  (1.676 m)   Wt 222 lb (100.7 kg)   SpO2 97%   BMI 35.83 kg/m       Objective:   Physical Exam Constitutional:      Appearance: Normal appearance.  Musculoskeletal:     Left knee: Swelling present. No effusion, erythema or ecchymosis. Decreased range of motion. Tenderness present over the MCL. Normal alignment.       Legs:  Skin:    General: Skin is warm and dry.  Neurological:     General: No focal deficit present.     Mental Status: She is alert and oriented to person, place, and time.     Gait: Gait abnormal (walking with single prong cane).  Psychiatric:        Mood and Affect: Mood normal.        Behavior: Behavior normal.        Thought Content: Thought content normal.        Judgment: Judgment normal.  Assessment & Plan:  1. Chronic pain of left knee (Primary) - Will order Xray today  - Follow up with orthopedics  - DG Knee 1-2 Views Left; Future  2. Compression fracture of T2 vertebra with routine healing  - Ambulatory referral to Neurosurgery  3. Compression fracture of T6 vertebra with routine healing, subsequent encounter  - Ambulatory referral to Neurosurgery  4. Closed compression fracture of body of L1 vertebra (HCC)  - Ambulatory referral to Neurosurgery  Alto Atta, NP  Time spent with patient today was 32 minutes which consisted of chart review, discussing compression fractures and left sided knee pain work up, treatment answering questions and documentation.

## 2023-06-11 ENCOUNTER — Ambulatory Visit

## 2023-06-11 DIAGNOSIS — Z96652 Presence of left artificial knee joint: Secondary | ICD-10-CM | POA: Diagnosis not present

## 2023-06-11 DIAGNOSIS — R296 Repeated falls: Secondary | ICD-10-CM | POA: Diagnosis not present

## 2023-06-11 DIAGNOSIS — M51369 Other intervertebral disc degeneration, lumbar region without mention of lumbar back pain or lower extremity pain: Secondary | ICD-10-CM | POA: Diagnosis not present

## 2023-06-11 DIAGNOSIS — K219 Gastro-esophageal reflux disease without esophagitis: Secondary | ICD-10-CM | POA: Diagnosis not present

## 2023-06-11 DIAGNOSIS — M25562 Pain in left knee: Secondary | ICD-10-CM

## 2023-06-11 DIAGNOSIS — F419 Anxiety disorder, unspecified: Secondary | ICD-10-CM | POA: Diagnosis not present

## 2023-06-11 DIAGNOSIS — G8929 Other chronic pain: Secondary | ICD-10-CM | POA: Diagnosis not present

## 2023-06-11 DIAGNOSIS — G4700 Insomnia, unspecified: Secondary | ICD-10-CM | POA: Diagnosis not present

## 2023-06-11 DIAGNOSIS — I10 Essential (primary) hypertension: Secondary | ICD-10-CM | POA: Diagnosis not present

## 2023-06-11 DIAGNOSIS — Z9181 History of falling: Secondary | ICD-10-CM | POA: Diagnosis not present

## 2023-06-11 DIAGNOSIS — S82851D Displaced trimalleolar fracture of right lower leg, subsequent encounter for closed fracture with routine healing: Secondary | ICD-10-CM | POA: Diagnosis not present

## 2023-06-11 DIAGNOSIS — Z87891 Personal history of nicotine dependence: Secondary | ICD-10-CM | POA: Diagnosis not present

## 2023-06-11 DIAGNOSIS — S22028D Other fracture of second thoracic vertebra, subsequent encounter for fracture with routine healing: Secondary | ICD-10-CM | POA: Diagnosis not present

## 2023-06-11 DIAGNOSIS — D509 Iron deficiency anemia, unspecified: Secondary | ICD-10-CM | POA: Diagnosis not present

## 2023-06-11 DIAGNOSIS — Z86718 Personal history of other venous thrombosis and embolism: Secondary | ICD-10-CM | POA: Diagnosis not present

## 2023-06-11 DIAGNOSIS — Z860100 Personal history of colon polyps, unspecified: Secondary | ICD-10-CM | POA: Diagnosis not present

## 2023-06-11 DIAGNOSIS — E785 Hyperlipidemia, unspecified: Secondary | ICD-10-CM | POA: Diagnosis not present

## 2023-06-11 DIAGNOSIS — I480 Paroxysmal atrial fibrillation: Secondary | ICD-10-CM | POA: Diagnosis not present

## 2023-06-11 DIAGNOSIS — F39 Unspecified mood [affective] disorder: Secondary | ICD-10-CM | POA: Diagnosis not present

## 2023-06-11 DIAGNOSIS — M8580 Other specified disorders of bone density and structure, unspecified site: Secondary | ICD-10-CM | POA: Diagnosis not present

## 2023-06-11 DIAGNOSIS — Z853 Personal history of malignant neoplasm of breast: Secondary | ICD-10-CM | POA: Diagnosis not present

## 2023-06-11 DIAGNOSIS — S32010D Wedge compression fracture of first lumbar vertebra, subsequent encounter for fracture with routine healing: Secondary | ICD-10-CM | POA: Diagnosis not present

## 2023-06-11 DIAGNOSIS — Z79891 Long term (current) use of opiate analgesic: Secondary | ICD-10-CM | POA: Diagnosis not present

## 2023-06-11 DIAGNOSIS — M19011 Primary osteoarthritis, right shoulder: Secondary | ICD-10-CM | POA: Diagnosis not present

## 2023-06-11 DIAGNOSIS — S22058D Other fracture of T5-T6 vertebra, subsequent encounter for fracture with routine healing: Secondary | ICD-10-CM | POA: Diagnosis not present

## 2023-06-11 DIAGNOSIS — F32A Depression, unspecified: Secondary | ICD-10-CM | POA: Diagnosis not present

## 2023-06-11 DIAGNOSIS — K5901 Slow transit constipation: Secondary | ICD-10-CM | POA: Diagnosis not present

## 2023-06-14 ENCOUNTER — Telehealth: Payer: Self-pay | Admitting: *Deleted

## 2023-06-14 DIAGNOSIS — S22028D Other fracture of second thoracic vertebra, subsequent encounter for fracture with routine healing: Secondary | ICD-10-CM | POA: Diagnosis not present

## 2023-06-14 DIAGNOSIS — I480 Paroxysmal atrial fibrillation: Secondary | ICD-10-CM | POA: Diagnosis not present

## 2023-06-14 DIAGNOSIS — S32010D Wedge compression fracture of first lumbar vertebra, subsequent encounter for fracture with routine healing: Secondary | ICD-10-CM | POA: Diagnosis not present

## 2023-06-14 DIAGNOSIS — S22058D Other fracture of T5-T6 vertebra, subsequent encounter for fracture with routine healing: Secondary | ICD-10-CM | POA: Diagnosis not present

## 2023-06-14 DIAGNOSIS — I10 Essential (primary) hypertension: Secondary | ICD-10-CM | POA: Diagnosis not present

## 2023-06-14 DIAGNOSIS — F39 Unspecified mood [affective] disorder: Secondary | ICD-10-CM | POA: Diagnosis not present

## 2023-06-14 NOTE — Telephone Encounter (Signed)
-----   Message from Debbie Fails sent at 06/14/2023 12:18 PM EDT ----- Can you please let her know that in d/w Dr. Marven Slimmer, no gold plated/alternative metal watchman devices. He did not feel strongly about putting a loop or making any changes to medications unless she was having symptoms At our last visit it did not seem like she had any symptoms of AFib, so I think at this time, no changes.  Thanks renee

## 2023-06-14 NOTE — Telephone Encounter (Signed)
 Spoke with patient and aware of recommendations  Per Randle Butler

## 2023-06-16 DIAGNOSIS — Z96652 Presence of left artificial knee joint: Secondary | ICD-10-CM | POA: Diagnosis not present

## 2023-06-16 DIAGNOSIS — M25562 Pain in left knee: Secondary | ICD-10-CM | POA: Diagnosis not present

## 2023-06-22 DIAGNOSIS — I10 Essential (primary) hypertension: Secondary | ICD-10-CM | POA: Diagnosis not present

## 2023-06-22 DIAGNOSIS — S22058D Other fracture of T5-T6 vertebra, subsequent encounter for fracture with routine healing: Secondary | ICD-10-CM | POA: Diagnosis not present

## 2023-06-22 DIAGNOSIS — S22028D Other fracture of second thoracic vertebra, subsequent encounter for fracture with routine healing: Secondary | ICD-10-CM | POA: Diagnosis not present

## 2023-06-22 DIAGNOSIS — I480 Paroxysmal atrial fibrillation: Secondary | ICD-10-CM | POA: Diagnosis not present

## 2023-06-22 DIAGNOSIS — S32010D Wedge compression fracture of first lumbar vertebra, subsequent encounter for fracture with routine healing: Secondary | ICD-10-CM | POA: Diagnosis not present

## 2023-06-22 DIAGNOSIS — F39 Unspecified mood [affective] disorder: Secondary | ICD-10-CM | POA: Diagnosis not present

## 2023-06-24 DIAGNOSIS — L4050 Arthropathic psoriasis, unspecified: Secondary | ICD-10-CM | POA: Diagnosis not present

## 2023-06-24 DIAGNOSIS — M5030 Other cervical disc degeneration, unspecified cervical region: Secondary | ICD-10-CM | POA: Diagnosis not present

## 2023-06-24 DIAGNOSIS — E669 Obesity, unspecified: Secondary | ICD-10-CM | POA: Diagnosis not present

## 2023-06-24 DIAGNOSIS — L401 Generalized pustular psoriasis: Secondary | ICD-10-CM | POA: Diagnosis not present

## 2023-06-24 DIAGNOSIS — M5136 Other intervertebral disc degeneration, lumbar region with discogenic back pain only: Secondary | ICD-10-CM | POA: Diagnosis not present

## 2023-06-24 DIAGNOSIS — M1991 Primary osteoarthritis, unspecified site: Secondary | ICD-10-CM | POA: Diagnosis not present

## 2023-06-24 DIAGNOSIS — M8580 Other specified disorders of bone density and structure, unspecified site: Secondary | ICD-10-CM | POA: Diagnosis not present

## 2023-06-24 DIAGNOSIS — M25511 Pain in right shoulder: Secondary | ICD-10-CM | POA: Diagnosis not present

## 2023-06-24 DIAGNOSIS — Z6835 Body mass index (BMI) 35.0-35.9, adult: Secondary | ICD-10-CM | POA: Diagnosis not present

## 2023-06-30 DIAGNOSIS — S32010D Wedge compression fracture of first lumbar vertebra, subsequent encounter for fracture with routine healing: Secondary | ICD-10-CM | POA: Diagnosis not present

## 2023-06-30 DIAGNOSIS — I480 Paroxysmal atrial fibrillation: Secondary | ICD-10-CM | POA: Diagnosis not present

## 2023-06-30 DIAGNOSIS — I10 Essential (primary) hypertension: Secondary | ICD-10-CM | POA: Diagnosis not present

## 2023-06-30 DIAGNOSIS — F39 Unspecified mood [affective] disorder: Secondary | ICD-10-CM | POA: Diagnosis not present

## 2023-06-30 DIAGNOSIS — S22028D Other fracture of second thoracic vertebra, subsequent encounter for fracture with routine healing: Secondary | ICD-10-CM | POA: Diagnosis not present

## 2023-06-30 DIAGNOSIS — S22058D Other fracture of T5-T6 vertebra, subsequent encounter for fracture with routine healing: Secondary | ICD-10-CM | POA: Diagnosis not present

## 2023-07-01 ENCOUNTER — Other Ambulatory Visit (HOSPITAL_COMMUNITY): Payer: Self-pay | Admitting: *Deleted

## 2023-07-01 ENCOUNTER — Telehealth (HOSPITAL_COMMUNITY): Payer: Self-pay | Admitting: *Deleted

## 2023-07-01 MED ORDER — VENLAFAXINE HCL ER 150 MG PO CP24: 150.0000 mg | ORAL_CAPSULE | Freq: Every day | ORAL | 0 refills | Status: DC

## 2023-07-01 NOTE — Telephone Encounter (Signed)
 Provider Authorized Refill venlafaxine  XR (EFFEXOR -XR) 150 MG 24 hr capsule   SENT CO SIGN

## 2023-07-01 NOTE — Telephone Encounter (Signed)
 Patient called for refill out medication Has been in hospital & rehab therapy  [id number] [place], [place] - [address]    venlafaxine  XR (EFFEXOR -XR) 150 MG 24 hr capsule   NEXT APPT 07/14/23 LAST APPT  04/12/23

## 2023-07-09 DIAGNOSIS — Z6835 Body mass index (BMI) 35.0-35.9, adult: Secondary | ICD-10-CM | POA: Diagnosis not present

## 2023-07-09 DIAGNOSIS — S32010A Wedge compression fracture of first lumbar vertebra, initial encounter for closed fracture: Secondary | ICD-10-CM | POA: Diagnosis not present

## 2023-07-13 ENCOUNTER — Other Ambulatory Visit: Payer: Self-pay | Admitting: Neurological Surgery

## 2023-07-13 DIAGNOSIS — S32010A Wedge compression fracture of first lumbar vertebra, initial encounter for closed fracture: Secondary | ICD-10-CM

## 2023-07-14 ENCOUNTER — Telehealth (HOSPITAL_COMMUNITY): Admitting: Psychiatry

## 2023-07-14 ENCOUNTER — Other Ambulatory Visit: Payer: Self-pay | Admitting: Adult Health

## 2023-07-14 ENCOUNTER — Encounter (HOSPITAL_COMMUNITY): Payer: Self-pay | Admitting: Psychiatry

## 2023-07-14 DIAGNOSIS — F331 Major depressive disorder, recurrent, moderate: Secondary | ICD-10-CM

## 2023-07-14 DIAGNOSIS — G4700 Insomnia, unspecified: Secondary | ICD-10-CM

## 2023-07-14 DIAGNOSIS — F1021 Alcohol dependence, in remission: Secondary | ICD-10-CM

## 2023-07-14 DIAGNOSIS — F5102 Adjustment insomnia: Secondary | ICD-10-CM | POA: Diagnosis not present

## 2023-07-14 MED ORDER — TRAZODONE HCL 50 MG PO TABS: 50.0000 mg | ORAL_TABLET | Freq: Every evening | ORAL | 1 refills | Status: DC | PRN

## 2023-07-14 MED ORDER — BUPROPION HCL ER (SR) 100 MG PO TB12: 100.0000 mg | ORAL_TABLET | Freq: Every day | ORAL | 1 refills | Status: DC

## 2023-07-14 NOTE — Progress Notes (Signed)
 [facility] Outpatient visit Tele psych  Patient Identification: Tonya Bartlett MRN:  [id number] Date of Evaluation:  07/14/2023 Referral Source: NP Chief Complaint:   Depression follow up  Visit Diagnosis:    ICD-10-CM   1. Major depressive disorder, recurrent episode, moderate (HCC)  F33.1     2. Adjustment insomnia  F51.02     Virtual Visit via Video Note  I connected with Tonya Bartlett on 07/14/23 at  1:00 PM EDT by a video enabled telemedicine application and verified that I am speaking with the correct person using two identifiers.  Location: Patient: home Provider: home office   I discussed the limitations of evaluation and management by telemedicine and the availability of in person appointments. The patient expressed understanding and agreed to proceed.     I discussed the assessment and treatment plan with the patient. The patient was provided an opportunity to ask questions and all were answered. The patient agreed with the plan and demonstrated an understanding of the instructions.   The patient was advised to call back or seek an in-person evaluation if the symptoms worsen or if the condition fails to improve as anticipated.  I provided 15 minutes of non-face-to-face time during this encounter.        History of Present Illness: 78  years old currently widowed white female lives by herself has 3 grown kids and 6 grandkids referred initially by family medicine for management of depression  Remains sober 40 plus years, attends and chair AA groups  Has had breast surgery daignosed with cancer, recovering On eval mood wise doing fair but has been experiencing dizzy spells so being evaluated Does not want to lower meds as it keeps balance Family is supportive Sleeps well on trazadone  Cautioned of side effects and to keep hydrated and monitor BP  Modifying factors : family, daughter aggravating factors. Hip surgery,  breast surgery, medical conditions Severity fair  in regard to mood   Past Psychiatric History: depression  Previous Psychotropic Medications: Yes   Substance Abuse History in the last 12 months:  No.  Consequences of Substance Abuse: NA  Past Medical History:  Past Medical History:  Diagnosis Date   Alcohol abuse    Sober 41 years.   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  1996   History of hiatal hernia    History of iron deficiency anemia 1996   iron infusion   History of migraine headaches    History of radiation therapy    left breast 03/27/2021-04/23/2021 Dr Retta Caster   History of rib fracture    Hyperlipidemia    Hypertension    IBS (irritable bowel syndrome)    Insomnia    Joint pain    MCI (mild cognitive impairment)  Melanoma (HCC) 2019   left leg    Migraine headache    hx of migraines    OA (osteoarthritis)    RIGHT SHOULDER   Osteopenia    Personal history of radiation therapy    Pneumonia    remote history   PONV (postoperative nausea and vomiting)    Recovering alcoholic (HCC)    SINCE 01-21-1980   Right rotator cuff tear    SOB (shortness of breath) on exertion    Substance abuse (HCC)    recovering alcoholic   Swallowing difficulty    Unspecified essential hypertension     Past Surgical History:  Procedure Laterality Date   ATRIAL FIBRILLATION ABLATION N/A 10/13/2022   Procedure: ATRIAL FIBRILLATION ABLATION;  Surgeon: Boyce Byes, MD;  Location: [facility]  INVASIVE CV LAB;  Service: Cardiovascular;  Laterality: N/A;   BREAST BIOPSY Left 01/06/2021   pos   BREAST LUMPECTOMY     BREAST LUMPECTOMY WITH RADIOACTIVE SEED LOCALIZATION Left 02/27/2021   Procedure: LEFT BREAST LUMPECTOMY WITH RADIOACTIVE SEED LOCALIZATION;  Surgeon: Lockie Rima, MD;  Location: [facility] OR;  Service: General;  Laterality: Left;   BUNIONECTOMY/  HAMMERTOE CORRECTION  RIGHT FOOT  2011   CATARACT EXTRACTION W/ INTRAOCULAR LENS  IMPLANT, BILATERAL     COLONOSCOPY     DILATION AND CURETTAGE OF UTERUS  1976   EYE SURGERY Bilateral 2016   cataract removal   KNEE ARTHROSCOPY W/ MENISCECTOMY Bilateral X2  LEFT /    X1  RIGHT   KNEE OPEN LATERAL RELEASE Bilateral    MOHS SURGERY Left 12/15/2017   Melanoma in situ - left calf - [facility]   ORIF ANKLE FRACTURE Right 08/04/2020   Procedure: OPEN REDUCTION INTERNAL FIXATION (ORIF) ANKLE FRACTURE;  Surgeon: Amada Backer, MD;  Location: [facility] ORS;  Service: Orthopedics;  Laterality: Right;  Mini C-arm, [employer] Small Frag   REPLACEMENT TOTAL KNEE Left 2006   SHOULDER ARTHROSCOPY WITH SUBACROMIAL DECOMPRESSION, ROTATOR CUFF REPAIR AND BICEP TENDON REPAIR Right 05/23/2013   Procedure: RIGHT SHOULDER ARTHROSCOPY EXAM UNDER ANESTHESIA  WITH SUBACROMIAL DECOMPRESSION,DISTAL CLAVICLE RESECTION, SADLABRAL DEBRIDEMENT CHONDROPLASTY, BICEP TENOTOMY ;  Surgeon: Genevie Kerns, MD;  Location: [facility];  Service: Orthopedics;  Laterality: Right;   TONSILLECTOMY AND ADENOIDECTOMY  AGE 78   TOTAL HIP ARTHROPLASTY Left 05/04/2017   Procedure: LEFT TOTAL HIP ARTHROPLASTY ANTERIOR APPROACH;  Surgeon: Claiborne Crew, MD;  Location: [facility] OR[facility];  Service: Orthopedics;  Laterality: Left;   TOTAL HIP ARTHROPLASTY Right 08/01/2019   Procedure: TOTAL HIP ARTHROPLASTY ANTERIOR APPROACH;  Surgeon: Claiborne Crew, MD;  Location: [facility];  Service: Orthopedics;  Laterality: Right;    TOTAL KNEE ARTHROPLASTY Bilateral LEFT  1996/    RIGHT 2004   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      UPPER GASTROINTESTINAL ENDOSCOPY     UPPER GI ENDOSCOPY     VAGINAL HYSTERECTOMY  1976    Family Psychiatric History: mom possible depression  Family History:  Family History  Problem Relation Age of Onset   Cancer Mother    Heart disease Mother    Hypertension Mother    Melanoma Mother 55   Obesity Mother    Heart disease Father 36   Hypertension Father    Esophageal cancer Brother    Dementia Paternal Grandfather    Melanoma Niece 24   Colon cancer Neg Hx    Colon polyps Neg Hx    Stomach cancer Neg Hx    Rectal cancer  Neg Hx     Social History:   Social History   Socioeconomic History   Marital status: Widowed    Spouse name: Not on file   Number of children: 3   Years of education: Not on file   Highest education level: Master's degree (e.g., MA, MS, MEng, MEd, MSW, MBA)  Occupational History   Occupation: retired Paramedic  Tobacco Use   Smoking status: Former    Current packs/day: 0.00    Average packs/day: 0.5 packs/day for 15.0 years (7.5 ttl pk-yrs)    Types: Cigarettes    Start date: 05/16/1970    Quit date: 05/15/1985    Years since quitting: 38.1   Smokeless tobacco: Never   Tobacco comments:    Former smoker 12/10/22  Vaping Use   Vaping status: Never Used  Substance and Sexual Activity   Alcohol use: No    Comment: RECOVERING ALCOHOLIC--  QUIT [phone]   Drug use: No   Sexual activity: Not Currently    Birth control/protection: Post-menopausal  Other Topics Concern   Not on file  Social History Narrative   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    Widowed    Three kids    6 grandchildren       Social Drivers of Corporate investment banker Strain: Low Risk  (04/18/2021)   Overall Financial Resource Strain (CARDIA)    Difficulty of Paying Living Expenses: Not hard at all  Food Insecurity: No Food Insecurity (04/15/2023)   Hunger Vital Sign     Worried About Running Out of Food in the Last Year: Never true    Ran Out of Food in the Last Year: Never true  Transportation Needs: No Transportation Needs (04/15/2023)   PRAPARE - Administrator, Civil Service (Medical): No    Lack of Transportation (Non-Medical): No  Physical Activity: Inactive (04/18/2021)   Exercise Vital Sign    Days of Exercise per Week: 0 days    Minutes of Exercise per Session: 0 min  Stress: No Stress Concern Present (04/18/2021)   [employer] of Occupational Health - Occupational Stress Questionnaire    Feeling of Stress : Not at all  Social Connections: Moderately Integrated (04/18/2021)   Social Connection and Isolation Panel [NHANES]    Frequency of Communication with Friends and Family: More than three times a week    Frequency of Social Gatherings with Friends and Family: More than three times a week    Attends Religious Services: More than 4 times per year    Active Member of [employer] or Organizations: Yes    Attends Banker Meetings: More than 4 times per year    Marital Status: Widowed     Allergies:   Allergies  Allergen Reactions   Cuprimine [Penicillamine] Anaphylaxis   Nsaids Anaphylaxis and Other (See Comments)    Severe stomach cramping Mouth sores   Otezla [Apremilast] Anaphylaxis    Suicidal ideation   Penicillins Anaphylaxis   Erythromycin Other (See Comments)    SEVERE STOMACH CRAMPS   Morphine And Codeine Nausea And Vomiting   Remicade  [Infliximab ] Other (See Comments) and Hypertension    Shut down immune system     Skyrizi [Risankizumab] Other (See Comments) and Cough    Upper respiratory infection Urinary tract infection Fever   Tolectin [Tolmetin] Other (See Comments)    Severe stomach cramping   Nickel Rash    Including snaps on hospital gowns  Metabolic Disorder Labs: Lab Results  Component Value Date   HGBA1C 5.3 01/29/2022   No results found for: "PROLACTIN" Lab Results  Component  Value Date   CHOL 147 01/29/2022   TRIG 84 01/29/2022   HDL 61 01/29/2022   CHOLHDL 2.7 06/11/2021   VLDL 08.6 04/24/2021   LDLCALC 70 01/29/2022   LDLCALC 90 06/11/2021     Current Medications: Current Outpatient Medications  Medication Sig Dispense Refill   acetaminophen  (TYLENOL ) 325 MG tablet Take 2 tablets (650 mg total) by mouth every 4 (four) hours as needed for mild pain (pain score 1-3) (or Fever >/= 101).     albuterol  (VENTOLIN  HFA) 108 (90 Base) MCG/ACT inhaler INHALE 2 PUFFS INTO THE LUNGS EVERY 6 (SIX) HOURS AS NEEDED FOR WHEEZING OR SHORTNESS OF BREATH (COUGH ). START WITH USING IN AM, AROUND NOON AND 1 HOUR BEFORE BEDTIME TO HELP COUGH SYMPTOMS. 8.5 each 1   atorvastatin  (LIPITOR) 40 MG tablet TAKE 1 TABLET BY MOUTH EVERY DAY 90 tablet 3   buPROPion  ER (WELLBUTRIN  SR) 100 MG 12 hr tablet Take 1 tablet (100 mg total) by mouth at bedtime. 30 tablet 1   cyclobenzaprine  (FLEXERIL ) 5 MG tablet Take 1 tablet (5 mg total) by mouth 3 (three) times daily as needed for muscle spasms. 30 tablet 0   HYDROcodone -acetaminophen  (NORCO/VICODIN) 5-325 MG tablet Take 2 tablets by mouth every 4 (four) hours as needed for moderate pain (pain score 4-6) or severe pain (pain score 7-10). 30 tablet 0   losartan  (COZAAR ) 100 MG tablet TAKE 1 TABLET BY MOUTH EVERY DAY 90 tablet 3   metoprolol  tartrate (LOPRESSOR ) 25 MG tablet Take 1 tablet (25 mg total) by mouth 2 (two) times daily.     omeprazole  (PRILOSEC) 40 MG capsule TAKE 1 CAPSULE BY MOUTH 2 (TWO) TIMES DAILY. TAKE 30 MINUTES BEFORE BREAKFAST & DINNER. (Patient taking differently: Take 40 mg by mouth daily.) 180 capsule 1   polyethylene glycol (MIRALAX  / GLYCOLAX ) 17 g packet Take 17 g by mouth daily as needed for moderate constipation. 14 each 0   senna-docusate (SENOKOT-S) 8.6-50 MG tablet Take 1 tablet by mouth at bedtime as needed for mild constipation. 30 tablet 0   sulfamethoxazole -trimethoprim  (BACTRIM ,SEPTRA ) 400-80 MG tablet Take 1  tablet by mouth at bedtime.     tamoxifen  (NOLVADEX ) 10 MG tablet Take 1/2 tablet by mouth in the evening 90 tablet 1   traZODone  (DESYREL ) 50 MG tablet Take 1 tablet (50 mg total) by mouth at bedtime as needed for sleep. 30 tablet 1   umeclidinium-vilanterol (ANORO ELLIPTA ) 62.5-25 MCG/ACT AEPB Inhale 1 puff into the lungs daily. 60 each 3   venlafaxine  XR (EFFEXOR -XR) 150 MG 24 hr capsule Take 1 capsule (150 mg total) by mouth daily. 90 capsule 0   No current facility-administered medications for this visit.     Psychiatric Specialty Exam: Review of Systems  Cardiovascular:  Negative for chest pain.  Musculoskeletal:  Positive for back pain.  Psychiatric/Behavioral:  Negative for suicidal ideas.     There were no vitals taken for this visit.There is no height or weight on file to calculate BMI.  General Appearance: casual  Eye Contact:  fair  Speech:  Normal Rate  Volume:  Decreased  Mood: fair  Affect: congruent  Thought Process:  Goal Directed  Orientation:  Full (Time, Place, and Person)  Thought Content:  Rumination  Suicidal Thoughts:  No  Homicidal Thoughts:  No  Memory:  Immediate;  Fair Recent;   Fair  Judgement:  Fair  Insight:  Fair  Psychomotor Activity:  Normal  Concentration:  Concentration: Fair and Attention Span: Fair  Recall:  [employer] of Knowledge:Good  Language: Good  Akathisia:  No  Handed:  Right  AIMS (if indicated):    Assets:  Desire for Improvement  ADL's:  Intact  Cognition: Impaired,  Mild  Sleep:  Variable to fair on meds    Treatment Plan Summary: Medication management and Plan as follows   Prior documentation reviewed    1. MDD, mild to moderate:fair continue effexor , wellbutrin   Being evaluated for dizzy spells  2. Insomnia: reviewed sleep hygiene, is on trazadone 3. Alcohol dependence:sustained remission, remains sober continue relapse prevention   Reviewed meds, questions addressed  Fu 2- 3 m.     Wray Heady,  MD 5/28/20251:13 PM

## 2023-07-18 ENCOUNTER — Other Ambulatory Visit: Payer: Self-pay | Admitting: Cardiology

## 2023-07-21 DIAGNOSIS — M150 Primary generalized (osteo)arthritis: Secondary | ICD-10-CM | POA: Diagnosis not present

## 2023-07-21 DIAGNOSIS — G894 Chronic pain syndrome: Secondary | ICD-10-CM | POA: Diagnosis not present

## 2023-07-21 DIAGNOSIS — M47812 Spondylosis without myelopathy or radiculopathy, cervical region: Secondary | ICD-10-CM | POA: Diagnosis not present

## 2023-07-21 DIAGNOSIS — L4052 Psoriatic arthritis mutilans: Secondary | ICD-10-CM | POA: Diagnosis not present

## 2023-07-22 ENCOUNTER — Ambulatory Visit
Admission: RE | Admit: 2023-07-22 | Discharge: 2023-07-22 | Disposition: A | Discharge status: Home / Self Care | Source: Ambulatory Visit | Attending: Neurological Surgery | Admitting: Neurological Surgery

## 2023-07-22 DIAGNOSIS — S32010A Wedge compression fracture of first lumbar vertebra, initial encounter for closed fracture: Secondary | ICD-10-CM

## 2023-07-22 DIAGNOSIS — R600 Localized edema: Secondary | ICD-10-CM | POA: Diagnosis not present

## 2023-07-22 DIAGNOSIS — M51369 Other intervertebral disc degeneration, lumbar region without mention of lumbar back pain or lower extremity pain: Secondary | ICD-10-CM | POA: Diagnosis not present

## 2023-07-22 DIAGNOSIS — L4050 Arthropathic psoriasis, unspecified: Secondary | ICD-10-CM | POA: Diagnosis not present

## 2023-07-22 DIAGNOSIS — C439 Malignant melanoma of skin, unspecified: Secondary | ICD-10-CM | POA: Diagnosis not present

## 2023-07-22 DIAGNOSIS — M4316 Spondylolisthesis, lumbar region: Secondary | ICD-10-CM | POA: Diagnosis not present

## 2023-07-22 MED ORDER — GADOPICLENOL 0.5 MMOL/ML IV SOLN
10.0000 mL | Freq: Once | INTRAVENOUS | Status: AC | PRN
  Administered 2023-07-22: 10 mL via INTRAVENOUS

## 2023-07-29 NOTE — Progress Notes (Deleted)
  Electrophysiology Office Follow up Visit Note:    Date:  07/29/2023   ID:  Tonya Bartlett, DOB Jan 13, 1946, MRN [id number]  PCP:  Alto Atta, NP  [facility] Cardiologist:  Lauro Portal, MD  [facility] Electrophysiologist:  Boyce Byes, MD    Interval History:     Tonya Bartlett is a 78 y.o. female who presents for a follow up visit.   The patient last saw [facility] May 25, 2023.  She has atrial fibrillation.  She has been unable to take continuous anticoagulation because of recurrent falls.  She has a nickel allergy which is contraindication for watchman implant.        Past medical, surgical, social and family history were reviewed.  ROS:   Please see the history of present illness.    All other systems reviewed and are negative.  EKGs/Labs/Other Studies Reviewed:    The following studies were reviewed today:          Physical Exam:    VS:  There were no vitals taken for this visit.    Wt Readings from Last 3 Encounters:  06/10/23 222 lb (100.7 kg)  05/25/23 222 lb (100.7 kg)  05/19/23 242 lb (109.8 kg)     GEN: no distress CARD: RRR, No MRG RESP: No IWOB. CTAB.      ASSESSMENT:    1. PAF (paroxysmal atrial fibrillation) (HCC)   2. Primary hypertension    PLAN:    In order of problems listed above:  #Atrial fibrillation Prior catheter ablation.  Not on anticoagulation given history of falls.  Not a candidate for watchman because of nickel allergy. We discussed alternatives to watchman implant during today's office visit including atrial clip.  #Hypertension *** goal today.  Recommend checking blood pressures 1-2 times per week at home and recording the values.  Recommend bringing these recordings to the primary care physician.      Signed, Harvie Liner, MD, [facility], [facility] 07/29/2023 10:13 PM    Electrophysiology [facility]

## 2023-07-30 ENCOUNTER — Ambulatory Visit: Admitting: Cardiology

## 2023-07-30 DIAGNOSIS — I480 Paroxysmal atrial fibrillation: Secondary | ICD-10-CM

## 2023-07-30 DIAGNOSIS — I10 Essential (primary) hypertension: Secondary | ICD-10-CM

## 2023-08-01 NOTE — Progress Notes (Unsigned)
  Electrophysiology Office Follow up Visit Note:    Date:  08/04/2023   ID:  Tonya Bartlett, DOB 1945/11/03, MRN [id number]  PCP:  Alto Atta, NP  [facility] Cardiologist:  Lauro Portal, MD  [facility] Electrophysiologist:  Boyce Byes, MD    Interval History:     Tonya Bartlett is a 78 y.o. female who presents for a follow up visit.   The patient last saw [facility] May 25, 2023.  She has atrial fibrillation.  She has been unable to take continuous anticoagulation because of recurrent falls.  She has a nickel allergy which is contraindication for watchman implant.  She is doing okay.  She did have a recent fall about 1 week ago.  She was bending over and was standing next to her rollator but lost balance and fell forward.  Thankfully, no personal injury.  She is off her anticoagulant.      Past medical, surgical, social and family history were reviewed.  ROS:   Please see the history of present illness.    All other systems reviewed and are negative.  EKGs/Labs/Other Studies Reviewed:    The following studies were reviewed today:          Physical Exam:    VS:  BP 120/62   Pulse 84   Ht 5' 6 (1.676 m)   Wt 217 lb 12.8 oz (98.8 kg)   SpO2 98%   BMI 35.15 kg/m     Wt Readings from Last 3 Encounters:  08/04/23 217 lb 12.8 oz (98.8 kg)  06/10/23 222 lb (100.7 kg)  05/25/23 222 lb (100.7 kg)     GEN: no distress.  Obese CARD: RRR, No MRG RESP: No IWOB. CTAB.      ASSESSMENT:    1. PAF (paroxysmal atrial fibrillation) (HCC)   2. Primary hypertension     PLAN:    In order of problems listed above:  #Atrial fibrillation Prior catheter ablation.  Not on anticoagulation given history of falls.  Not a candidate for watchman because of nickel allergy. We discussed alternatives to watchman implant during today's office visit including atrial clip.  I do not think she is an appropriate surgical candidate for left atrial appendage clipping.   We had a discussion about the pros and cons of starting anticoagulation and mutually decided that the best course of action would be to avoid anticoagulant use at this time given the high risk of bleeding with her frequent falls.  She understands that she is at an increased risk of stroke off the anticoagulant.  #Hypertension At goal today.  Recommend checking blood pressures 1-2 times per week at home and recording the values.  Recommend bringing these recordings to the primary care physician.  Follow-up 1 year with APP.    Signed, Harvie Liner, MD, [facility], [facility] 08/04/2023 10:18 AM    Electrophysiology [facility]

## 2023-08-04 ENCOUNTER — Encounter: Payer: Self-pay | Admitting: Cardiology

## 2023-08-04 ENCOUNTER — Ambulatory Visit: Attending: Cardiology | Admitting: Cardiology

## 2023-08-04 VITALS — BP 120/62 | HR 84 | Ht 66.0 in | Wt 217.8 lb

## 2023-08-04 DIAGNOSIS — I480 Paroxysmal atrial fibrillation: Secondary | ICD-10-CM | POA: Insufficient documentation

## 2023-08-04 DIAGNOSIS — I10 Essential (primary) hypertension: Secondary | ICD-10-CM | POA: Diagnosis not present

## 2023-08-04 NOTE — Patient Instructions (Signed)
 Medication Instructions:  Your physician recommends that you continue on your current medications as directed. Please refer to the Current Medication list given to you today.  *If you need a refill on your cardiac medications before your next appointment, please call your pharmacy*  Follow-Up: At [facility], you and your health needs are our priority.  As part of our continuing mission to provide you with exceptional heart care, our providers are all part of one team.  This team includes your primary Cardiologist (physician) and Advanced Practice Providers or APPs (Physician Assistants and Nurse Practitioners) who all work together to provide you with the care you need, when you need it.  Your next appointment:   1 year  Provider:   You will see one of the following Advanced Practice Providers on your designated Care Team:   Mertha Abrahams, Kennard Pea [address]" [place], PA-C Suzann Riddle, NP Creighton Doffing, NP

## 2023-08-05 ENCOUNTER — Other Ambulatory Visit: Payer: Self-pay | Admitting: Adult Health

## 2023-08-11 ENCOUNTER — Telehealth (HOSPITAL_COMMUNITY): Payer: Self-pay | Admitting: *Deleted

## 2023-08-11 ENCOUNTER — Telehealth: Payer: Self-pay | Admitting: Adult Health

## 2023-08-11 ENCOUNTER — Other Ambulatory Visit (HOSPITAL_COMMUNITY): Payer: Self-pay | Admitting: *Deleted

## 2023-08-11 MED ORDER — BUPROPION HCL ER (SR) 100 MG PO TB12: 100.0000 mg | ORAL_TABLET | Freq: Every day | ORAL | 1 refills | Status: DC

## 2023-08-11 MED ORDER — RIZATRIPTAN BENZOATE 10 MG PO TABS: 10.0000 mg | ORAL_TABLET | ORAL | 1 refills | Status: DC | PRN

## 2023-08-11 NOTE — Progress Notes (Signed)
 Provider Authorized 1 Refill   buPROPion  ER (WELLBUTRIN  SR) 100 MG 12 hr tablet    NEXT APPT 09/27/23 LAST APPT  07/14/23

## 2023-08-11 NOTE — Telephone Encounter (Signed)
**Note De-identified  [place] Obfuscation** Please advise 

## 2023-08-11 NOTE — Telephone Encounter (Signed)
 Copied from CRM [id number]. Topic: Clinical - Medication Refill >> Aug 11, 2023 11:17 AM Thersia C wrote: Medication: rizatriptan  (MAXALT ) 10 MG tablet  Has the patient contacted their pharmacy? Yes (Agent: If no, request that the patient contact the pharmacy for the refill. If patient does not wish to contact the pharmacy document the reason why and proceed with request.) (Agent: If yes, when and what did the pharmacy advise?)  This is the patient's preferred pharmacy:  [id number] [place] - [address] [place] [address] Phone: [phone] Fax: [phone]  Is this the correct pharmacy for this prescription? Yes If no, delete pharmacy and type the correct one.   Has the prescription been filled recently? No  Is the patient out of the medication? Yes  Has the patient been seen for an appointment in the last year OR does the patient have an upcoming appointment? Yes  Can we respond through MyChart? Yes  Agent: Please be advised that Rx refills may take up to 3 business days. We ask that you follow-up with your pharmacy.

## 2023-08-11 NOTE — Telephone Encounter (Signed)
 PROVIDER AUTHORIZED 1 REFILL  SENT CO SIGN   buPROPion  ER (WELLBUTRIN  SR) 100 MG 12 hr tablet    NEXT APPT 09/27/23 LAST APPT  07/14/23

## 2023-08-11 NOTE — Telephone Encounter (Addendum)
 [facility] DAY REQUEST  buPROPion  ER (WELLBUTRIN  SR) 100 MG 12 hr tablet   NEXT APPT 09/27/23 LAST APPT  07/14/23

## 2023-08-12 DIAGNOSIS — S32010A Wedge compression fracture of first lumbar vertebra, initial encounter for closed fracture: Secondary | ICD-10-CM | POA: Diagnosis not present

## 2023-08-12 DIAGNOSIS — Z6834 Body mass index (BMI) 34.0-34.9, adult: Secondary | ICD-10-CM | POA: Diagnosis not present

## 2023-08-12 DIAGNOSIS — M5412 Radiculopathy, cervical region: Secondary | ICD-10-CM | POA: Diagnosis not present

## 2023-08-12 DIAGNOSIS — S22080G Wedge compression fracture of T11-T12 vertebra, subsequent encounter for fracture with delayed healing: Secondary | ICD-10-CM | POA: Diagnosis not present

## 2023-08-16 ENCOUNTER — Telehealth (HOSPITAL_COMMUNITY): Payer: Self-pay

## 2023-08-16 MED ORDER — BUPROPION HCL ER (SR) 100 MG PO TB12: 100.0000 mg | ORAL_TABLET | Freq: Every day | ORAL | 0 refills | Status: DC

## 2023-08-16 NOTE — Telephone Encounter (Addendum)
 Medication refill - [facility] requesting [facility] order for her Bupropion  100 mg, last ordered 6.25/25 + 1 refill. Pt. returns next on 09/27/23.

## 2023-08-31 ENCOUNTER — Telehealth: Payer: Self-pay | Admitting: Adult Health

## 2023-08-31 ENCOUNTER — Other Ambulatory Visit: Payer: Self-pay | Admitting: Adult Health

## 2023-08-31 MED ORDER — SUMATRIPTAN SUCCINATE 100 MG PO TABS: 100.0000 mg | ORAL_TABLET | ORAL | 2 refills | Status: DC | PRN

## 2023-08-31 NOTE — Telephone Encounter (Signed)
 Noted

## 2023-08-31 NOTE — Telephone Encounter (Unsigned)
 Copied from CRM [id number]. Topic: General - Other >> Aug 27, 2023  4:24 PM Geroldine GRADE wrote: Reason for CRM: Patient is calling in to talk to a nurse about her medication. She stated it's a long story and would like to talk to someone about it that can assist.

## 2023-08-31 NOTE — Telephone Encounter (Unsigned)
 Copied from CRM [id number]. Topic: Clinical - Prescription Issue >> Aug 31, 2023  4:14 PM Tonya Bartlett ORN wrote: Reason for CRM:  Patient states she is currently taking rizatriptan  (MAXALT ) 10 MG tablet for her headaches and the medication is not working. She states she spoke to her insurance who states they will cover SUMAtriptan  (IMITREX ) tablet 100 mg for her. Would like for a new prescription to be written for this medication. She is following up on this prescription. Patient can be reached at [phone]

## 2023-08-31 NOTE — Telephone Encounter (Unsigned)
 Copied from CRM [id number]. Topic: Clinical - Prescription Issue >> Aug 30, 2023  8:45 AM [name] wrote: Reason for CRM: Patient states she is currently taking rizatriptan  (MAXALT ) 10 MG tablet for her headaches and the medication is not working. States she spoke to her insurance who states they will cover SUMAtriptan  (IMITREX ) tablet 100 mg for her. Would like for a new prescription to be written for this medication.  Patient can be reached at [phone]

## 2023-08-31 NOTE — Telephone Encounter (Signed)
 Patient notified of update  and verbalized understanding.

## 2023-09-08 ENCOUNTER — Other Ambulatory Visit: Payer: Self-pay | Admitting: Neurological Surgery

## 2023-09-08 DIAGNOSIS — M5412 Radiculopathy, cervical region: Secondary | ICD-10-CM

## 2023-09-13 ENCOUNTER — Ambulatory Visit
Admission: RE | Admit: 2023-09-13 | Discharge: 2023-09-13 | Disposition: A | Discharge status: Home / Self Care | Source: Ambulatory Visit | Attending: Neurological Surgery | Admitting: Neurological Surgery

## 2023-09-13 DIAGNOSIS — M5412 Radiculopathy, cervical region: Secondary | ICD-10-CM

## 2023-09-14 ENCOUNTER — Other Ambulatory Visit: Payer: Self-pay | Admitting: Neurological Surgery

## 2023-09-14 DIAGNOSIS — M5412 Radiculopathy, cervical region: Secondary | ICD-10-CM

## 2023-09-16 ENCOUNTER — Other Ambulatory Visit: Payer: Self-pay | Admitting: Neurological Surgery

## 2023-09-16 DIAGNOSIS — R2681 Unsteadiness on feet: Secondary | ICD-10-CM

## 2023-09-16 DIAGNOSIS — D1801 Hemangioma of skin and subcutaneous tissue: Secondary | ICD-10-CM | POA: Diagnosis not present

## 2023-09-16 DIAGNOSIS — Z85828 Personal history of other malignant neoplasm of skin: Secondary | ICD-10-CM | POA: Diagnosis not present

## 2023-09-16 DIAGNOSIS — L821 Other seborrheic keratosis: Secondary | ICD-10-CM | POA: Diagnosis not present

## 2023-09-16 DIAGNOSIS — Z86018 Personal history of other benign neoplasm: Secondary | ICD-10-CM | POA: Diagnosis not present

## 2023-09-16 DIAGNOSIS — L578 Other skin changes due to chronic exposure to nonionizing radiation: Secondary | ICD-10-CM | POA: Diagnosis not present

## 2023-09-16 DIAGNOSIS — D229 Melanocytic nevi, unspecified: Secondary | ICD-10-CM | POA: Diagnosis not present

## 2023-09-16 DIAGNOSIS — L814 Other melanin hyperpigmentation: Secondary | ICD-10-CM | POA: Diagnosis not present

## 2023-09-20 ENCOUNTER — Ambulatory Visit
Admission: RE | Admit: 2023-09-20 | Discharge: 2023-09-20 | Disposition: A | Discharge status: Home / Self Care | Source: Ambulatory Visit | Attending: Neurological Surgery | Admitting: Neurological Surgery

## 2023-09-20 DIAGNOSIS — M50121 Cervical disc disorder at C4-C5 level with radiculopathy: Secondary | ICD-10-CM | POA: Diagnosis not present

## 2023-09-20 DIAGNOSIS — M5412 Radiculopathy, cervical region: Secondary | ICD-10-CM

## 2023-09-20 DIAGNOSIS — M4802 Spinal stenosis, cervical region: Secondary | ICD-10-CM | POA: Diagnosis not present

## 2023-09-20 DIAGNOSIS — R2681 Unsteadiness on feet: Secondary | ICD-10-CM

## 2023-09-20 DIAGNOSIS — G9389 Other specified disorders of brain: Secondary | ICD-10-CM | POA: Diagnosis not present

## 2023-09-22 ENCOUNTER — Encounter (HOSPITAL_COMMUNITY): Payer: Self-pay

## 2023-09-22 ENCOUNTER — Other Ambulatory Visit: Payer: Self-pay

## 2023-09-22 ENCOUNTER — Emergency Department (HOSPITAL_COMMUNITY)

## 2023-09-22 ENCOUNTER — Emergency Department (HOSPITAL_COMMUNITY)
Admission: EM | Admit: 2023-09-22 | Discharge: 2023-09-22 | Disposition: A | Discharge status: Home / Self Care | Attending: Emergency Medicine | Admitting: Emergency Medicine

## 2023-09-22 DIAGNOSIS — W19XXXA Unspecified fall, initial encounter: Secondary | ICD-10-CM

## 2023-09-22 DIAGNOSIS — R2681 Unsteadiness on feet: Secondary | ICD-10-CM | POA: Diagnosis not present

## 2023-09-22 DIAGNOSIS — S0990XA Unspecified injury of head, initial encounter: Secondary | ICD-10-CM | POA: Diagnosis not present

## 2023-09-22 DIAGNOSIS — Y92000 Kitchen of unspecified non-institutional (private) residence as  the place of occurrence of the external cause: Secondary | ICD-10-CM | POA: Insufficient documentation

## 2023-09-22 DIAGNOSIS — Z85820 Personal history of malignant melanoma of skin: Secondary | ICD-10-CM | POA: Insufficient documentation

## 2023-09-22 DIAGNOSIS — S199XXA Unspecified injury of neck, initial encounter: Secondary | ICD-10-CM | POA: Diagnosis not present

## 2023-09-22 DIAGNOSIS — Z87891 Personal history of nicotine dependence: Secondary | ICD-10-CM | POA: Diagnosis not present

## 2023-09-22 DIAGNOSIS — Z96653 Presence of artificial knee joint, bilateral: Secondary | ICD-10-CM | POA: Insufficient documentation

## 2023-09-22 DIAGNOSIS — Z96641 Presence of right artificial hip joint: Secondary | ICD-10-CM | POA: Diagnosis not present

## 2023-09-22 DIAGNOSIS — Z79899 Other long term (current) drug therapy: Secondary | ICD-10-CM | POA: Insufficient documentation

## 2023-09-22 DIAGNOSIS — W01198A Fall on same level from slipping, tripping and stumbling with subsequent striking against other object, initial encounter: Secondary | ICD-10-CM | POA: Diagnosis not present

## 2023-09-22 DIAGNOSIS — M545 Low back pain, unspecified: Secondary | ICD-10-CM | POA: Insufficient documentation

## 2023-09-22 DIAGNOSIS — M4854XA Collapsed vertebra, not elsewhere classified, thoracic region, initial encounter for fracture: Secondary | ICD-10-CM | POA: Diagnosis not present

## 2023-09-22 DIAGNOSIS — S3992XA Unspecified injury of lower back, initial encounter: Secondary | ICD-10-CM | POA: Diagnosis not present

## 2023-09-22 DIAGNOSIS — M549 Dorsalgia, unspecified: Secondary | ICD-10-CM | POA: Diagnosis not present

## 2023-09-22 DIAGNOSIS — R269 Unspecified abnormalities of gait and mobility: Secondary | ICD-10-CM | POA: Diagnosis not present

## 2023-09-22 DIAGNOSIS — R270 Ataxia, unspecified: Secondary | ICD-10-CM | POA: Diagnosis not present

## 2023-09-22 LAB — BASIC METABOLIC PANEL WITH GFR
Anion gap: 11 (ref 5–15)
BUN: 11 mg/dL (ref 8–23)
CO2: 24 mmol/L (ref 22–32)
Calcium: 9.3 mg/dL (ref 8.9–10.3)
Chloride: 105 mmol/L (ref 98–111)
Creatinine, Ser: 0.66 mg/dL (ref 0.44–1.00)
GFR, Estimated: >60 mL/min
Glucose, Bld: 106 mg/dL — ABNORMAL HIGH (ref 70–99)
Potassium: 3.5 mmol/L (ref 3.5–5.1)
Sodium: 140 mmol/L (ref 135–145)

## 2023-09-22 LAB — CBC WITH DIFFERENTIAL/PLATELET
Abs Immature Granulocytes: 0.06 K/uL (ref 0.00–0.07)
Basophils Absolute: 0.1 K/uL (ref 0.0–0.1)
Basophils Relative: 0 %
Eosinophils Absolute: 0.1 K/uL (ref 0.0–0.5)
Eosinophils Relative: 1 %
HCT: 35.9 % — ABNORMAL LOW (ref 36.0–46.0)
Hemoglobin: 11.6 g/dL — ABNORMAL LOW (ref 12.0–15.0)
Immature Granulocytes: 1 %
Lymphocytes Relative: 8 %
Lymphs Abs: 0.9 K/uL (ref 0.7–4.0)
MCH: 32.5 pg (ref 26.0–34.0)
MCHC: 32.3 g/dL (ref 30.0–36.0)
MCV: 100.6 fL — ABNORMAL HIGH (ref 80.0–100.0)
Monocytes Absolute: 0.6 K/uL (ref 0.1–1.0)
Monocytes Relative: 6 %
Neutro Abs: 9.6 K/uL — ABNORMAL HIGH (ref 1.7–7.7)
Neutrophils Relative %: 84 %
Platelets: 266 K/uL (ref 150–400)
RBC: 3.57 MIL/uL — ABNORMAL LOW (ref 3.87–5.11)
RDW: 13.4 % (ref 11.5–15.5)
WBC: 11.3 K/uL — ABNORMAL HIGH (ref 4.0–10.5)
nRBC: 0 % (ref 0.0–0.2)

## 2023-09-22 MED ORDER — ACETAMINOPHEN 500 MG PO TABS
1000.0000 mg | ORAL_TABLET | Freq: Once | ORAL | Status: AC
  Administered 2023-09-22: 1000 mg via ORAL
  Filled 2023-09-22: qty 2

## 2023-09-22 NOTE — ED Provider Notes (Signed)
 [place] EMERGENCY DEPARTMENT AT [facility] Provider Note  CSN: [id number] Arrival date & time: 09/22/23 8057  Chief Complaint(s) Fall  HPI Tonya Bartlett is a 78 y.o. female who is here today after she had a fall.  Patient reports that since February, she has been having recurrent falls.  She is currently being worked up by her PCP, has had MRIs recently.  She denies any confusion, dysuria or increased urination.   Past Medical History Past Medical History:  Diagnosis Date   Alcohol abuse    Sober 41 years.   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  1996   History of hiatal hernia    History of iron deficiency anemia 1996   iron infusion   History of migraine headaches    History of radiation therapy    left breast 03/27/2021-04/23/2021 Dr Lynwood Nasuti   History of rib fracture    Hyperlipidemia    Hypertension    IBS (irritable bowel syndrome)    Insomnia    Joint pain    MCI (mild cognitive impairment)    Melanoma (HCC) 2019   left leg    Migraine headache    hx of migraines    OA (osteoarthritis)    RIGHT SHOULDER   Osteopenia    Personal history of radiation therapy    Pneumonia    remote history   PONV (postoperative nausea and vomiting)    Recovering alcoholic (HCC)    SINCE 01-21-1980   Right rotator cuff tear    SOB (shortness of breath) on exertion    Substance abuse (HCC)    recovering alcoholic   Swallowing  difficulty    Unspecified essential hypertension    Patient Active Problem List   Diagnosis Date Noted   Closed fracture of cervical vertebral body (HCC) 04/15/2023   Closed compression fracture of body of L1 vertebra (HCC) 04/15/2023   Vertebral compression fracture (HCC) 04/14/2023   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   Hypercoagulable state due to paroxysmal atrial fibrillation (HCC) 12/10/2022   BMI 33.0-33.9,adult 07/22/2022   BMI 32.0-32.9,adult 04/09/2022   Obesity, Beginning BMI 36.94 04/09/2022   B12 deficiency 01/29/2022   Pain of toe of right foot 12/25/2021   Pre-diabetes 11/05/2021   Essential hypertension 11/05/2021   Left knee pain 11/05/2021   Coronary artery calcification 08/07/2021   Vitamin D  deficiency 07/29/2021   Insulin  resistance 07/17/2021   Dysphagia 03/21/2021   Gastroesophageal reflux disease 03/21/2021   History of colon polyps 03/21/2021   Genetic testing 01/31/2021   Aortic atherosclerosis (HCC) 01/17/2021   Family history of melanoma 01/16/2021   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   PAF (paroxysmal atrial fibrillation) (HCC) 10/22/2020   Family history of  heart disease 10/22/2020   Trimalleolar fracture 07/31/2020   Right hip OA 08/01/2019   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   Insomnia 02/03/2019   Onychomycosis 11/28/2018   Obese 05/05/2017   S/P left THA, 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   MCI (mild cognitive impairment) 09/19/2015   Memory deficit 09/19/2015   SIRS (systemic inflammatory response syndrome) (HCC) 10/29/2014   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   Depression 11/16/2013   ADD (attention deficit disorder) 11/17/2011   Essential hypertension, benign 10/15/2009   SEBORRHEIC KERATOSIS, INFLAMED 10/17/2008   CARPAL TUNNEL SYNDROME, LEFT 06/22/2008   Allergic rhinitis 08/30/2007   Hyperlipidemia 11/11/2006   Migraine 11/11/2006   Osteoarthritis 11/11/2006   Home Medication(s) Prior to  Admission medications   Medication Sig Start Date End Date Taking? Authorizing Provider  acetaminophen  (TYLENOL ) 325 MG tablet Take 2 tablets (650 mg total) by mouth every 4 (four) hours as needed for mild pain (pain score 1-3) (or Fever >/= 101). 04/20/23   Rosario Leatrice FERNS, MD  albuterol  (VENTOLIN  HFA) 108 (90 Base) MCG/ACT inhaler INHALE 2 PUFFS INTO THE LUNGS EVERY 6 (SIX) HOURS AS NEEDED FOR WHEEZING OR SHORTNESS OF BREATH (COUGH ). START WITH USING IN AM, AROUND NOON AND 1 HOUR BEFORE BEDTIME TO HELP COUGH SYMPTOMS. 04/20/23   Nafziger, Darleene, NP  atorvastatin  (LIPITOR) 40 MG tablet TAKE 1 TABLET BY MOUTH EVERY DAY 09/16/22   Court Dorn PARAS, MD  buPROPion  ER (WELLBUTRIN  SR) 100 MG 12 hr tablet Take 1 tablet (100 mg total) by mouth at bedtime. 08/16/23   Geralene Kaiser, MD  cyclobenzaprine  (FLEXERIL ) 5 MG tablet Take 1 tablet (5 mg total) by mouth 3 (three) times daily as needed for muscle spasms. 04/20/23   Rosario Leatrice FERNS, MD  HYDROcodone -acetaminophen  (NORCO/VICODIN) 5-325 MG tablet Take 2 tablets by mouth every 4 (four) hours as needed for moderate pain (pain score 4-6) or severe pain (pain score 7-10). 04/20/23   Rosario Leatrice FERNS, MD  losartan  (COZAAR ) 100 MG tablet TAKE 1 TABLET BY MOUTH EVERY DAY 05/11/23   Nafziger, Cory, NP  metoprolol  tartrate (LOPRESSOR ) 25 MG tablet TAKE 1 TABLET BY MOUTH TWICE A DAY 07/20/23   Leverne Charlies Helling, PA-C  omeprazole  (PRILOSEC) 40 MG capsule TAKE 1 CAPSULE BY MOUTH 2 (TWO) TIMES DAILY. TAKE 30 MINUTES BEFORE BREAKFAST & DINNER. Patient taking differently: Take 40 mg by mouth daily. 10/15/22   Kennedy-Smith, Colleen M, NP  polyethylene glycol (MIRALAX  / GLYCOLAX ) 17 g packet Take 17 g by mouth daily as needed for moderate constipation. 04/20/23   Rosario Leatrice FERNS, MD  rizatriptan  (MAXALT ) 10 MG tablet Take 1 tablet (10 mg total) by mouth as needed for migraine. May repeat in 2 hours if needed 08/11/23   Nafziger, Darleene, NP  senna-docusate (SENOKOT-S) 8.6-50 MG  tablet Take 1 tablet by mouth at bedtime as needed for mild constipation. 04/20/23   Rosario Leatrice FERNS, MD  sulfamethoxazole -trimethoprim  (BACTRIM ,SEPTRA ) 400-80 MG tablet Take 1 tablet by mouth at bedtime.    Ottelin, Mark, MD  SUMAtriptan  (IMITREX ) 100 MG tablet Take 1 tablet (100 mg total) by mouth every 2 (two) hours as needed for migraine. May repeat in 2 hours if headache persists or recurs. 08/31/23   Nafziger, Darleene, NP  tamoxifen  (NOLVADEX ) 10 MG tablet Take 1/2 tablet by mouth in the evening Patient not taking: Reported on 08/04/2023 01/25/23   Gudena, Vinay, MD  traZODone  (DESYREL ) 50 MG tablet TAKE 1 TABLET BY MOUTH EVERYDAY AT BEDTIME 08/05/23  Nafziger, Cory, NP  umeclidinium-vilanterol (ANORO ELLIPTA ) 62.5-25 MCG/ACT AEPB Inhale 1 puff into the lungs daily. 05/19/23   Hunsucker, Donnice SAUNDERS, MD  venlafaxine  XR (EFFEXOR -XR) 150 MG 24 hr capsule Take 1 capsule (150 mg total) by mouth daily. 07/01/23   Geralene Kaiser, MD                                                                                                                                    Past Surgical History Past Surgical History:  Procedure Laterality Date   ATRIAL FIBRILLATION ABLATION N/A 10/13/2022   Procedure: ATRIAL FIBRILLATION ABLATION;  Surgeon: Cindie Ole DASEN, MD;  Location: [facility];  Service: Cardiovascular;  Laterality: N/A;   BREAST BIOPSY Left 01/06/2021   pos   BREAST LUMPECTOMY     BREAST LUMPECTOMY WITH RADIOACTIVE SEED LOCALIZATION Left 02/27/2021   Procedure: LEFT BREAST LUMPECTOMY WITH RADIOACTIVE SEED LOCALIZATION;  Surgeon: Aron Shoulders, MD;  Location: [facility] OR;  Service: General;  Laterality: Left;   BUNIONECTOMY/  HAMMERTOE CORRECTION  RIGHT FOOT  2011   CATARACT EXTRACTION W/ INTRAOCULAR LENS  IMPLANT, BILATERAL     COLONOSCOPY     DILATION AND CURETTAGE OF UTERUS  1976   EYE SURGERY Bilateral 2016   cataract removal   KNEE ARTHROSCOPY W/ MENISCECTOMY Bilateral X2  LEFT /    X1  RIGHT    KNEE OPEN LATERAL RELEASE Bilateral    MOHS SURGERY Left 12/15/2017   Melanoma in situ - left calf - [facility]   ORIF ANKLE FRACTURE Right 08/04/2020   Procedure: OPEN REDUCTION INTERNAL FIXATION (ORIF) ANKLE FRACTURE;  Surgeon: Kit Rush, MD;  Location: [facility];  Service: Orthopedics;  Laterality: Right;  Mini C-arm, [employer] Small Frag   REPLACEMENT TOTAL KNEE Left 2006   SHOULDER ARTHROSCOPY WITH SUBACROMIAL DECOMPRESSION, ROTATOR CUFF REPAIR AND BICEP TENDON REPAIR Right 05/23/2013   Procedure: RIGHT SHOULDER ARTHROSCOPY EXAM UNDER ANESTHESIA  WITH SUBACROMIAL DECOMPRESSION,DISTAL CLAVICLE RESECTION, SADLABRAL DEBRIDEMENT CHONDROPLASTY, BICEP TENOTOMY ;  Surgeon: Lamar Collet, MD;  Location: [facility];  Service: Orthopedics;  Laterality: Right;   TONSILLECTOMY AND ADENOIDECTOMY  AGE 59   TOTAL HIP ARTHROPLASTY Left 05/04/2017   Procedure: LEFT TOTAL HIP ARTHROPLASTY ANTERIOR APPROACH;  Surgeon: Ernie Donnice, MD;  Location: [facility];  Service: Orthopedics;  Laterality: Left;   TOTAL HIP ARTHROPLASTY Right 08/01/2019   Procedure: TOTAL HIP ARTHROPLASTY ANTERIOR APPROACH;  Surgeon: Ernie Donnice, MD;  Location: [facility];  Service: Orthopedics;  Laterality: Right;    TOTAL KNEE ARTHROPLASTY Bilateral LEFT  1996/   RIGHT 2004   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      UPPER GASTROINTESTINAL ENDOSCOPY     UPPER GI ENDOSCOPY     VAGINAL HYSTERECTOMY  1976   Family History Family History  Problem Relation Age of Onset   Cancer Mother    Heart disease Mother  Hypertension Mother    Melanoma Mother 101   Obesity Mother    Heart disease Father 105   Hypertension Father    Esophageal cancer Brother    Dementia Paternal Grandfather    Melanoma Niece 6   Colon cancer Neg Hx    Colon polyps Neg Hx    Stomach cancer Neg Hx    Rectal cancer Neg Hx     Social History Social History   Tobacco Use   Smoking status: Former    Current packs/day: 0.00     Average packs/day: 0.5 packs/day for 15.0 years (7.5 ttl pk-yrs)    Types: Cigarettes    Start date: 05/16/1970    Quit date: 05/15/1985    Years since quitting: 38.3   Smokeless tobacco: Never   Tobacco comments:    Former smoker 12/10/22  Vaping Use   Vaping status: Never Used  Substance Use Topics   Alcohol use: No    Comment: RECOVERING ALCOHOLIC--  QUIT [phone]   Drug use: No   Allergies Cuprimine [penicillamine], Nsaids, Otezla [apremilast], Penicillins, Erythromycin, Morphine and codeine, Remicade  [infliximab ], Skyrizi [risankizumab], Tolectin [tolmetin], and Nickel  Review of Systems Review of Systems  Physical Exam Vital Signs  I have reviewed the triage vital signs BP (!) 163/104 (BP Location: Right Arm)   Pulse 70   Temp 98 F (36.7 C) (Oral)   Resp 18   SpO2 99%   Physical Exam Vitals and nursing note reviewed.  Constitutional:      Appearance: Normal appearance.  HENT:     Head: Normocephalic and atraumatic.     Nose: Nose normal.  Eyes:     Pupils: Pupils are equal, round, and reactive to light.  Cardiovascular:     Rate and Rhythm: Normal rate.  Pulmonary:     Effort: Pulmonary effort is normal.  Abdominal:     General: Abdomen is flat. There is no distension.     Palpations: Abdomen is soft.     Tenderness: There is no abdominal tenderness.  Musculoskeletal:        General: No swelling or deformity. Normal range of motion.     Cervical back: Normal range of motion. No rigidity.     Comments: Patient endorses lower lumbar spine tenderness.  No median spinous process tenderness, no step-offs or deformities.  Lymphadenopathy:     Cervical: No cervical adenopathy.  Skin:    General: Skin is warm.  Neurological:     General: No focal deficit present.     Mental Status: She is alert.     Sensory: No sensory deficit.     Motor: No weakness.     ED Results and Treatments Labs (all labs ordered are listed, but only abnormal results are  displayed) Labs Reviewed  BASIC METABOLIC PANEL WITH GFR - Abnormal; Notable for the following components:      Result Value   Glucose, Bld 106 (*)    All other components within normal limits  CBC WITH DIFFERENTIAL/PLATELET - Abnormal; Notable for the following components:   WBC 11.3 (*)    RBC 3.57 (*)    Hemoglobin 11.6 (*)    HCT 35.9 (*)    MCV 100.6 (*)    Neutro Abs 9.6 (*)    All other components within normal limits  Radiology CT Head Wo Contrast Result Date: 09/22/2023 CLINICAL DATA:  Ataxia, head trauma; Ataxia, cervical trauma pt was in kitchen and stepped back and tripped over an object and fell backwards. Pts reports she hit her head on a trash can, no loc. Pt c/o lumbar back pain. EXAM: CT HEAD WITHOUT CONTRAST CT CERVICAL SPINE WITHOUT CONTRAST TECHNIQUE: Multidetector CT imaging of the head and cervical spine was performed following the standard protocol without intravenous contrast. Multiplanar CT image reconstructions of the cervical spine were also generated. RADIATION DOSE REDUCTION: This exam was performed according to the departmental dose-optimization program which includes automated exposure control, adjustment of the mA and/or kV according to patient size and/or use of iterative reconstruction technique. COMPARISON:  CT head and C-spine 04/14/2023 FINDINGS: CT HEAD FINDINGS Brain: Patchy and confluent areas of decreased attenuation are noted throughout the deep and periventricular white matter of the cerebral hemispheres bilaterally, compatible with chronic microvascular ischemic disease. No evidence of large-territorial acute infarction. No parenchymal hemorrhage. No mass lesion. No extra-axial collection. No mass effect or midline shift. No hydrocephalus. Basilar cisterns are patent. Vascular: No hyperdense vessel. Atherosclerotic calcifications are  present within the cavernous internal carotid arteries. Skull: No acute fracture or focal lesion. Sinuses/Orbits: Paranasal sinuses and mastoid air cells are clear. The orbits are unremarkable. Other: None. CT CERVICAL SPINE FINDINGS Alignment: Normal. Skull base and vertebrae: C4-C5 moderate degenerative changes of the spine. Multilevel mild degenerative changes spine. No severe osseous central canal or neural foraminal stenosis. No acute fracture. No aggressive appearing focal osseous lesion or focal pathologic process. Soft tissues and spinal canal: No prevertebral fluid or swelling. No visible canal hematoma. Upper chest: Mild emphysematous changes. Other: None. IMPRESSION: 1. No acute intracranial abnormality. 2. No acute displaced fracture or traumatic listhesis of the cervical spine. 3.  Emphysema (ICD10-J43.9). Electronically Signed   By: Morgane  Naveau M.D.   On: 09/22/2023 23:03   CT Cervical Spine Wo Contrast Result Date: 09/22/2023 CLINICAL DATA:  Ataxia, head trauma; Ataxia, cervical trauma pt was in kitchen and stepped back and tripped over an object and fell backwards. Pts reports she hit her head on a trash can, no loc. Pt c/o lumbar back pain. EXAM: CT HEAD WITHOUT CONTRAST CT CERVICAL SPINE WITHOUT CONTRAST TECHNIQUE: Multidetector CT imaging of the head and cervical spine was performed following the standard protocol without intravenous contrast. Multiplanar CT image reconstructions of the cervical spine were also generated. RADIATION DOSE REDUCTION: This exam was performed according to the departmental dose-optimization program which includes automated exposure control, adjustment of the mA and/or kV according to patient size and/or use of iterative reconstruction technique. COMPARISON:  CT head and C-spine 04/14/2023 FINDINGS: CT HEAD FINDINGS Brain: Patchy and confluent areas of decreased attenuation are noted throughout the deep and periventricular white matter of the cerebral hemispheres  bilaterally, compatible with chronic microvascular ischemic disease. No evidence of large-territorial acute infarction. No parenchymal hemorrhage. No mass lesion. No extra-axial collection. No mass effect or midline shift. No hydrocephalus. Basilar cisterns are patent. Vascular: No hyperdense vessel. Atherosclerotic calcifications are present within the cavernous internal carotid arteries. Skull: No acute fracture or focal lesion. Sinuses/Orbits: Paranasal sinuses and mastoid air cells are clear. The orbits are unremarkable. Other: None. CT CERVICAL SPINE FINDINGS Alignment: Normal. Skull base and vertebrae: C4-C5 moderate degenerative changes of the spine. Multilevel mild degenerative changes spine. No severe osseous central canal or neural foraminal stenosis. No acute fracture. No aggressive appearing focal osseous lesion or focal pathologic process. Soft  tissues and spinal canal: No prevertebral fluid or swelling. No visible canal hematoma. Upper chest: Mild emphysematous changes. Other: None. IMPRESSION: 1. No acute intracranial abnormality. 2. No acute displaced fracture or traumatic listhesis of the cervical spine. 3.  Emphysema (ICD10-J43.9). Electronically Signed   By: Morgane  Naveau M.D.   On: 09/22/2023 23:03   CT Lumbar Spine Wo Contrast Result Date: 09/22/2023 CLINICAL DATA:  Back trauma, no prior imaging (Age >= 16y) EXAM: CT LUMBAR SPINE WITHOUT CONTRAST TECHNIQUE: Multidetector CT imaging of the lumbar spine was performed without intravenous contrast administration. Multiplanar CT image reconstructions were also generated. RADIATION DOSE REDUCTION: This exam was performed according to the departmental dose-optimization program which includes automated exposure control, adjustment of the mA and/or kV according to patient size and/or use of iterative reconstruction technique. COMPARISON:  MRI lumbar spine 07/22/2023 trauma CT lumbar spine 04/14/2023 FINDINGS: Segmentation: 5 lumbar type vertebrae.  Alignment: Normal. Vertebrae: Diffusely decreased bone density. Redemonstration of T12 compression fracture with at least 90% vertebral body height loss. No acute lumbar spine fracture. No suspicious lytic or blastic lesion. Paraspinal and other soft tissues: Negative. Disc levels: Intervertebral disc space vacuum phenomenon at the L4-L5 level. Other: Moderate atherosclerotic plaque. Fluid density lesion of the left kidney likely represents a simple renal cyst. Simple renal cysts, in the absence of clinically indicated signs/symptoms, require no independent follow-up. IMPRESSION: 1. No acute displaced fracture or traumatic listhesis of the lumbar spine. 2. Redemonstration of T12 compression fracture with at least 90% vertebral body height loss. 3. Diffusely decreased bone density. Electronically Signed   By: Morgane  Naveau M.D.   On: 09/22/2023 22:58    Pertinent labs & imaging results that were available during my care of the patient were reviewed by me and considered in my medical decision making (see MDM for details).  Medications Ordered in ED Medications  acetaminophen  (TYLENOL ) tablet 1,000 mg (1,000 mg Oral Given 09/22/23 2243)                                                                                                                                     Procedures Procedures  (including critical care time)  Medical Decision Making / ED Course   This patient presents to the ED for concern of nonsyncopal fall from standing, this involves an extensive number of treatment options, and is a complaint that carries with it a high risk of complications and morbidity.  The differential diagnosis includes fall, chronic fall, balance issues, lumbar spine contusion, lumbar spine fracture, less likely ICH.  MDM: Patient reportedly has been dealing with falls since February.  She is currently in the process of a very thorough outpatient workup which has included MRIs.  Unfortunately, patient without  a clear etiology yet.  She lives at home, uses walker to ambulate.  She appears to be at her baseline presently.  Will perform traumatic workup on the patient.  Consider etiology  such as normal pressure hydrocephalus, however patient without other symptoms aside from unsteady gait.  Does not appears that patient has seen neurology yet.  Will plan to refer the patient to neurology for additional workup.  Reassessment 11:10 PM-patient CT imaging of the head, cervical spine and lumbar spine negative for acute process.  Patient continues to have her T12 lumbar spine fracture.  Patient is ambulatory at her baseline.  She wishes to go home.  Will refer to neurology for continued management of her gait difficulties.   Additional history obtained: -Additional history obtained from EMS -External records from outside source obtained and reviewed including: Chart review including previous notes, labs, imaging, consultation notes   Lab Tests: -I ordered, reviewed, and interpreted labs.   The pertinent results include:   Labs Reviewed  BASIC METABOLIC PANEL WITH GFR - Abnormal; Notable for the following components:      Result Value   Glucose, Bld 106 (*)    All other components within normal limits  CBC WITH DIFFERENTIAL/PLATELET - Abnormal; Notable for the following components:   WBC 11.3 (*)    RBC 3.57 (*)    Hemoglobin 11.6 (*)    HCT 35.9 (*)    MCV 100.6 (*)    Neutro Abs 9.6 (*)    All other components within normal limits       Imaging Studies ordered: I ordered imaging studies including CT imaging of the head, neck, lumbar spine I independently visualized and interpreted imaging. I agree with the radiologist interpretation   Medicines ordered and prescription drug management: Meds ordered this encounter  Medications   acetaminophen  (TYLENOL ) tablet 1,000 mg    -I have reviewed the patients home medicines and have made adjustments as needed  Cardiac Monitoring: The patient was  maintained on a cardiac monitor.  I personally viewed and interpreted the cardiac monitored which showed an underlying rhythm of: Normal sinus rhythm  Social Determinants of Health:  Factors impacting patients care include: Lack of access to primary care   Reevaluation: After the interventions noted above, I reevaluated the patient and found that they have :improved  Co morbidities that complicate the patient evaluation  Past Medical History:  Diagnosis Date   Alcohol abuse    Sober 41 years.   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  1996   History of hiatal hernia    History of iron deficiency anemia 1996   iron infusion   History of migraine headaches    History of radiation therapy    left breast 03/27/2021-04/23/2021 Dr Lynwood Nasuti   History of rib fracture    Hyperlipidemia    Hypertension    IBS (irritable bowel syndrome)    Insomnia    Joint pain  MCI (mild cognitive impairment)    Melanoma (HCC) 2019   left leg    Migraine headache    hx of migraines    OA (osteoarthritis)    RIGHT SHOULDER   Osteopenia    Personal history of radiation therapy    Pneumonia    remote history   PONV (postoperative nausea and vomiting)    Recovering alcoholic (HCC)    SINCE 01-21-1980   Right rotator cuff tear    SOB (shortness of breath) on exertion    Substance abuse (HCC)    recovering alcoholic   Swallowing difficulty    Unspecified essential  hypertension       Dispostion: I considered admission for this patient, however she is at her baseline is appropriate for discharge.     Final Clinical Impression(s) / ED Diagnoses Final diagnoses:  Fall, initial encounter  Gait instability     @PCDICTATION @    Mannie Pac T, DO 09/22/23 2315

## 2023-09-22 NOTE — ED Triage Notes (Signed)
 Pt bibgcems from home pt was in kitchen and stepped back and tripped over an object and fell backwards. Pts reports she hit her head on a trash can, no loc. Pt c/o lumbar back pain.  Bp 132/98 Hr 66 98% RA

## 2023-09-22 NOTE — ED Notes (Signed)
 Assisted patient to the bathroom she pivoted into wheel chair

## 2023-09-22 NOTE — Discharge Instructions (Addendum)
 While you were in the emergency room, you had blood work done that was at your baseline.  Your CT scan did not show any new injuries.  I have sent a referral to our neurology office.  They will contact you within the next 1 week to set up an appointment to discuss your trouble with your walking.  Follow-up with your primary care doctor.

## 2023-09-27 ENCOUNTER — Encounter (HOSPITAL_COMMUNITY): Payer: Self-pay

## 2023-09-27 ENCOUNTER — Ambulatory Visit: Payer: Self-pay

## 2023-09-27 ENCOUNTER — Telehealth (HOSPITAL_COMMUNITY): Admitting: Psychiatry

## 2023-09-27 DIAGNOSIS — M50121 Cervical disc disorder at C4-C5 level with radiculopathy: Secondary | ICD-10-CM | POA: Diagnosis not present

## 2023-09-27 DIAGNOSIS — M4802 Spinal stenosis, cervical region: Secondary | ICD-10-CM | POA: Diagnosis not present

## 2023-09-27 DIAGNOSIS — G9389 Other specified disorders of brain: Secondary | ICD-10-CM | POA: Diagnosis not present

## 2023-09-27 NOTE — Telephone Encounter (Signed)
 FYI Only or Action Required?: FYI only for provider.  Patient was last seen in primary care on 06/10/2023 by Merna Huxley, NP.  Called Nurse Triage reporting Back Pain.  Symptoms began a week ago.  Interventions attempted: Nothing.  Symptoms are: unchanged.  Triage Disposition: See HCP Within 4 Hours (Or PCP Triage)  Patient/caregiver understands and will follow disposition?: Yes     Copied from CRM [id number]. Topic: Clinical - Red Word Triage >> Sep 27, 2023  2:54 PM Carlyon D wrote: Red Word that prompted transfer to Nurse Triage: Severe back pain, mostly the middle to the lower back, pain level 1-10 at this moment is 5 but when she stands up it is excruciating pain. Reason for Disposition  [1] SEVERE back pain (e.g., excruciating, unable to do any normal activities) AND [2] not improved 2 hours after pain medicine  Answer Assessment - Initial Assessment Questions 1. ONSET: When did the pain begin? (e.g., minutes, hours, days)     States fell on last Tuesday, states was in kitchen, states foot hit the water  bowl and fell.  States hit head and went to ER 2. LOCATION: Where does it hurt? (upper, mid or lower back)     Lower back 3. SEVERITY: How bad is the pain?  (e.g., Scale 1-10; mild, moderate, or severe)     When trying to get up its an 8-9 4. PATTERN: Is the pain constant? (e.g., yes, no; constant, intermittent)      Constant 5. RADIATION: Does the pain shoot into your legs or somewhere else?     thigh 6. CAUSE:  What do you think is causing the back pain?      fall 7. BACK OVERUSE:  Any recent lifting of heavy objects, strenuous work or exercise?     denies 8. MEDICINES: What have you taken so far for the pain? (e.g., nothing, acetaminophen , NSAIDS)     Yes, hydrocodone  9. NEUROLOGIC SYMPTOMS: Do you have any weakness, numbness, or problems with bowel/bladder control?     denies 10. OTHER SYMPTOMS: Do you have any other symptoms? (e.g., fever,  abdomen pain, burning with urination, blood in urine)       denies 11. PREGNANCY: Is there any chance you are pregnant? When was your last menstrual period?       na  Protocols used: Back Pain-A-AH

## 2023-09-28 ENCOUNTER — Ambulatory Visit: Admitting: Adult Health

## 2023-09-28 ENCOUNTER — Encounter: Payer: Self-pay | Admitting: Adult Health

## 2023-09-28 VITALS — BP 110/80 | HR 89 | Temp 98.0°F | Ht 66.0 in | Wt 209.0 lb

## 2023-09-28 DIAGNOSIS — R2681 Unsteadiness on feet: Secondary | ICD-10-CM | POA: Diagnosis not present

## 2023-09-28 DIAGNOSIS — G8929 Other chronic pain: Secondary | ICD-10-CM | POA: Diagnosis not present

## 2023-09-28 DIAGNOSIS — M545 Low back pain, unspecified: Secondary | ICD-10-CM | POA: Diagnosis not present

## 2023-09-28 MED ORDER — OMEPRAZOLE 40 MG PO CPDR: 40.0000 mg | DELAYED_RELEASE_CAPSULE | Freq: Two times a day (BID) | ORAL | 3 refills | Status: AC

## 2023-09-28 NOTE — Progress Notes (Signed)
 Subjective:    Patient ID: Tonya Bartlett, female    DOB: 1945-10-23, 78 y.o.   MRN: [id number]  HPI 78 year old female who  has a past medical history of Alcohol abuse, Allergic rhinitis, Allergy, Anemia, Anxiety, Blood transfusion without reported diagnosis, Breast cancer (HCC) (12/2020), Cancer (HCC), Cataract, Clotting disorder (HCC), Colitis, Complication of anesthesia, Depression, Diverticulosis, Dysrhythmia, Fatigue, Frequency of urination, GERD (gastroesophageal reflux disease), History of colon polyps, History of DVT of lower extremity, History of hiatal hernia, History of iron deficiency anemia (1996), History of migraine headaches, History of radiation therapy, History of rib fracture, Hyperlipidemia, Hypertension, IBS (irritable bowel syndrome), Insomnia, Joint pain, MCI (mild cognitive impairment), Melanoma (HCC) (2019), Migraine headache, OA (osteoarthritis), Osteopenia, Personal history of radiation therapy, Pneumonia, PONV (postoperative nausea and vomiting), Recovering alcoholic (HCC), Right rotator cuff tear, SOB (shortness of breath) on exertion, Substance abuse (HCC), Swallowing difficulty, and Unspecified essential hypertension.  She presents to the office today after being seen in the emergency room 6 days ago after she had a fall.  In the ER she had CT head, cervical spine, lumbar spine which were negative for acute process.  She has had multiple falls since February 2025 and MRIs without clear cause of her frequent falls.  She has been seen by neurosurgery and the emergency room referred her to neurology.  She has been either using a walker or a cane  She does report that her neurosurgeon referred her to physical therapy but she cannot get in until the middle of September.  He does feel unsteady with gait, feels weak and has a lot of pain in her lower back.  She has been pretty sedentary due to the pain.     Review of Systems See HPI   Past Medical History:  Diagnosis  Date   Alcohol abuse    Sober 41 years.   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  1996   History of hiatal hernia    History of iron deficiency anemia 1996   iron infusion   History of migraine headaches    History of radiation therapy    left breast 03/27/2021-04/23/2021 Dr Lynwood Nasuti   History of rib fracture    Hyperlipidemia    Hypertension    IBS (irritable bowel syndrome)    Insomnia    Joint pain    MCI (mild cognitive impairment)    Melanoma (HCC) 2019   left leg    Migraine headache    hx of migraines    OA (osteoarthritis)    RIGHT SHOULDER   Osteopenia    Personal history of radiation therapy    Pneumonia    remote history   PONV (postoperative nausea and vomiting)    Recovering alcoholic (  HCC)    SINCE 01-21-1980   Right rotator cuff tear    SOB (shortness of breath) on exertion    Substance abuse (HCC)    recovering alcoholic   Swallowing difficulty    Unspecified essential hypertension     Social History   Socioeconomic History   Marital status: Widowed    Spouse name: Not on file   Number of children: 3   Years of education: Not on file   Highest education level: Master's degree (e.g., MA, MS, MEng, MEd, MSW, MBA)  Occupational History   Occupation: retired Paramedic  Tobacco Use   Smoking status: Former    Current packs/day: 0.00    Average packs/day: 0.5 packs/day for 15.0 years (7.5 ttl pk-yrs)    Types: Cigarettes     Start date: 05/16/1970    Quit date: 05/15/1985    Years since quitting: 38.3   Smokeless tobacco: Never   Tobacco comments:    Former smoker 12/10/22  Vaping Use   Vaping status: Never Used  Substance and Sexual Activity   Alcohol use: No    Comment: RECOVERING ALCOHOLIC--  QUIT [id number]   Drug use: No   Sexual activity: Not Currently    Birth control/protection: Post-menopausal  Other Topics Concern   Not on file  Social History Narrative   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    Widowed    Three kids    6 grandchildren       Social Drivers of Corporate investment banker Strain: Low Risk  (04/18/2021)   Overall Financial Resource Strain (CARDIA)    Difficulty of Paying Living Expenses: Not hard at all  Food Insecurity: No Food Insecurity (04/15/2023)   Hunger Vital Sign    Worried About Running Out of Food in the Last Year: Never true    Ran Out of Food in the Last Year: Never true  Transportation Needs: No Transportation Needs (04/15/2023)   PRAPARE - Administrator, Civil Service (Medical): No    Lack of Transportation (Non-Medical): No  Physical Activity: Inactive (04/18/2021)   Exercise Vital Sign    Days of Exercise per Week: 0 days    Minutes of Exercise per Session: 0 min  Stress: No Stress Concern Present (04/18/2021)   [employer] of Occupational Health - Occupational Stress Questionnaire    Feeling of Stress : Not at all  Social Connections: Moderately Integrated (04/18/2021)   Social Connection and Isolation Panel    Frequency of Communication with Friends and Family: More than three times a week    Frequency of Social Gatherings with Friends and Family: More than three times a week    Attends Religious Services: More than 4 times per year    Active Member of [employer] or Organizations: Yes    Attends Banker Meetings: More than 4 times per year    Marital Status: Widowed  Intimate Partner  Violence: Not At Risk (04/15/2023)   Humiliation, Afraid, Rape, and Kick questionnaire    Fear of Current or Ex-Partner: No    Emotionally Abused: No    Physically Abused: No    Sexually Abused: No    Past Surgical History:  Procedure Laterality Date   ATRIAL FIBRILLATION ABLATION N/A 10/13/2022   Procedure: ATRIAL FIBRILLATION ABLATION;  Surgeon: Cindie Ole DASEN, MD;  Location: [facility];  Service: Cardiovascular;  Laterality: N/A;   BREAST BIOPSY Left 01/06/2021   pos  BREAST LUMPECTOMY     BREAST LUMPECTOMY WITH RADIOACTIVE SEED LOCALIZATION Left 02/27/2021   Procedure: LEFT BREAST LUMPECTOMY WITH RADIOACTIVE SEED LOCALIZATION;  Surgeon: Aron Shoulders, MD;  Location: [facility];  Service: General;  Laterality: Left;   BUNIONECTOMY/  HAMMERTOE CORRECTION  RIGHT FOOT  2011   CATARACT EXTRACTION W/ INTRAOCULAR LENS  IMPLANT, BILATERAL     COLONOSCOPY     DILATION AND CURETTAGE OF UTERUS  1976   EYE SURGERY Bilateral 2016   cataract removal   KNEE ARTHROSCOPY W/ MENISCECTOMY Bilateral X2  LEFT /    X1  RIGHT   KNEE OPEN LATERAL RELEASE Bilateral    MOHS SURGERY Left 12/15/2017   Melanoma in situ - left calf - [facility]   ORIF ANKLE FRACTURE Right 08/04/2020   Procedure: OPEN REDUCTION INTERNAL FIXATION (ORIF) ANKLE FRACTURE;  Surgeon: Kit Rush, MD;  Location: [facility];  Service: Orthopedics;  Laterality: Right;  Mini C-arm, [employer] Small Frag   REPLACEMENT TOTAL KNEE Left 2006   SHOULDER ARTHROSCOPY WITH SUBACROMIAL DECOMPRESSION, ROTATOR CUFF REPAIR AND BICEP TENDON REPAIR Right 05/23/2013   Procedure: RIGHT SHOULDER ARTHROSCOPY EXAM UNDER ANESTHESIA  WITH SUBACROMIAL DECOMPRESSION,DISTAL CLAVICLE RESECTION, SADLABRAL DEBRIDEMENT CHONDROPLASTY, BICEP TENOTOMY ;  Surgeon: Lamar Collet, MD;  Location: [facility] [place] [facility];  Service: Orthopedics;  Laterality: Right;   TONSILLECTOMY AND ADENOIDECTOMY  AGE 37   TOTAL HIP ARTHROPLASTY Left 05/04/2017    Procedure: LEFT TOTAL HIP ARTHROPLASTY ANTERIOR APPROACH;  Surgeon: Ernie Cough, MD;  Location: [facility];  Service: Orthopedics;  Laterality: Left;   TOTAL HIP ARTHROPLASTY Right 08/01/2019   Procedure: TOTAL HIP ARTHROPLASTY ANTERIOR APPROACH;  Surgeon: Ernie Cough, MD;  Location: [facility];  Service: Orthopedics;  Laterality: Right;    TOTAL KNEE ARTHROPLASTY Bilateral LEFT  1996/   RIGHT 2004   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      UPPER GASTROINTESTINAL ENDOSCOPY     UPPER GI ENDOSCOPY     VAGINAL HYSTERECTOMY  1976    Family History  Problem Relation Age of Onset   Cancer Mother    Heart disease Mother    Hypertension Mother    Melanoma Mother 79   Obesity Mother    Heart disease Father 68   Hypertension Father    Esophageal cancer Brother    Dementia Paternal Grandfather    Melanoma Niece 69   Colon cancer Neg Hx    Colon polyps Neg Hx    Stomach cancer Neg Hx    Rectal cancer Neg Hx     Allergies  Allergen Reactions   Cuprimine [Penicillamine] Anaphylaxis   Nsaids Anaphylaxis and Other (See Comments)    Severe stomach cramping Mouth sores   Otezla [Apremilast] Anaphylaxis    Suicidal ideation   Penicillins Anaphylaxis   Erythromycin Other (See Comments)    SEVERE STOMACH CRAMPS   Morphine And Codeine Nausea And Vomiting   Remicade  [Infliximab ] Other (See Comments) and Hypertension    Shut down immune system     Skyrizi [Risankizumab] Other (See Comments) and Cough    Upper respiratory infection Urinary tract infection Fever   Tolectin [Tolmetin] Other (See Comments)    Severe stomach cramping   Nickel Rash    Including snaps on hospital gowns     Current Outpatient Medications on File Prior to Visit  Medication Sig Dispense Refill   acetaminophen  (TYLENOL ) 325 MG tablet Take 2 tablets (650 mg total) by mouth every 4 (four) hours as needed for  mild pain (pain score 1-3) (or Fever >/= 101).     albuterol  (VENTOLIN  HFA) 108 (90 Base) MCG/ACT inhaler  INHALE 2 PUFFS INTO THE LUNGS EVERY 6 (SIX) HOURS AS NEEDED FOR WHEEZING OR SHORTNESS OF BREATH (COUGH ). START WITH USING IN AM, AROUND NOON AND 1 HOUR BEFORE BEDTIME TO HELP COUGH SYMPTOMS. 8.5 each 1   atorvastatin  (LIPITOR) 40 MG tablet TAKE 1 TABLET BY MOUTH EVERY DAY 90 tablet 3   buPROPion  ER (WELLBUTRIN  SR) 100 MG 12 hr tablet Take 1 tablet (100 mg total) by mouth at bedtime. 90 tablet 0   cyclobenzaprine  (FLEXERIL ) 5 MG tablet Take 1 tablet (5 mg total) by mouth 3 (three) times daily as needed for muscle spasms. 30 tablet 0   HYDROcodone -acetaminophen  (NORCO/VICODIN) 5-325 MG tablet Take 2 tablets by mouth every 4 (four) hours as needed for moderate pain (pain score 4-6) or severe pain (pain score 7-10). 30 tablet 0   losartan  (COZAAR ) 100 MG tablet TAKE 1 TABLET BY MOUTH EVERY DAY 90 tablet 3   metoprolol  tartrate (LOPRESSOR ) 25 MG tablet TAKE 1 TABLET BY MOUTH TWICE A DAY 180 tablet 3   omeprazole  (PRILOSEC) 40 MG capsule TAKE 1 CAPSULE BY MOUTH 2 (TWO) TIMES DAILY. TAKE 30 MINUTES BEFORE BREAKFAST & DINNER. (Patient taking differently: Take 40 mg by mouth daily.) 180 capsule 1   polyethylene glycol (MIRALAX  / GLYCOLAX ) 17 g packet Take 17 g by mouth daily as needed for moderate constipation. 14 each 0   rizatriptan  (MAXALT ) 10 MG tablet Take 1 tablet (10 mg total) by mouth as needed for migraine. May repeat in 2 hours if needed 10 tablet 1   senna-docusate (SENOKOT-S) 8.6-50 MG tablet Take 1 tablet by mouth at bedtime as needed for mild constipation. 30 tablet 0   sulfamethoxazole -trimethoprim  (BACTRIM ,SEPTRA ) 400-80 MG tablet Take 1 tablet by mouth at bedtime.     SUMAtriptan  (IMITREX ) 100 MG tablet Take 1 tablet (100 mg total) by mouth every 2 (two) hours as needed for migraine. May repeat in 2 hours if headache persists or recurs. 10 tablet 2   tamoxifen  (NOLVADEX ) 10 MG tablet Take 1/2 tablet by mouth in the evening (Patient not taking: Reported on 08/04/2023) 90 tablet 1   traZODone   (DESYREL ) 50 MG tablet TAKE 1 TABLET BY MOUTH EVERYDAY AT BEDTIME 90 tablet 1   umeclidinium-vilanterol (ANORO ELLIPTA ) 62.5-25 MCG/ACT AEPB Inhale 1 puff into the lungs daily. 60 each 3   venlafaxine  XR (EFFEXOR -XR) 150 MG 24 hr capsule Take 1 capsule (150 mg total) by mouth daily. 90 capsule 0   No current facility-administered medications on file prior to visit.    BP 110/80   Pulse 89   Temp 98 F (36.7 C) (Oral)   Ht 5' 6 (1.676 m)   Wt 209 lb (94.8 kg)   SpO2 97%   BMI 33.73 kg/m       Objective:   Physical Exam Vitals and nursing note reviewed.  Constitutional:      Appearance: Normal appearance.  Cardiovascular:     Rate and Rhythm: Normal rate and regular rhythm.     Pulses: Normal pulses.     Heart sounds: Normal heart sounds.  Pulmonary:     Effort: Pulmonary effort is normal.     Breath sounds: Normal breath sounds.  Skin:    General: Skin is warm and dry.     Capillary Refill: Capillary refill takes less than 2 seconds.  Neurological:  General: No focal deficit present.     Mental Status: She is alert and oriented to person, place, and time.     Gait: Gait abnormal (in wheelchair).  Psychiatric:        Mood and Affect: Mood normal.        Thought Content: Thought content normal.        Judgment: Judgment normal.       Assessment & Plan:  1. Gait instability (Primary) - Will refer her to a different physical therapy office to hopefully get her in sooner.  Physical therapy will be key to getting her strong enough to walk without much difficulty or pain.  She was encouraged to continue to use her rolling walker at home.  Will have her follow-up with neurology and neurosurgery as directed. - Ambulatory referral to Physical Therapy  2. Chronic midline low back pain without sciatica   Darleene Shape, NP  I personally spent a total of 33 minutes in the care of the patient today including preparing to see the patient, getting/reviewing separately  obtained history, counseling and educating, placing orders, documenting clinical information in the EHR, and communicating results.

## 2023-09-28 NOTE — Telephone Encounter (Signed)
 Noted

## 2023-10-04 ENCOUNTER — Ambulatory Visit: Attending: Adult Health | Admitting: Physical Therapy

## 2023-10-04 DIAGNOSIS — M546 Pain in thoracic spine: Secondary | ICD-10-CM | POA: Insufficient documentation

## 2023-10-04 DIAGNOSIS — M5459 Other low back pain: Secondary | ICD-10-CM | POA: Insufficient documentation

## 2023-10-04 DIAGNOSIS — R252 Cramp and spasm: Secondary | ICD-10-CM | POA: Diagnosis present

## 2023-10-04 DIAGNOSIS — M6281 Muscle weakness (generalized): Secondary | ICD-10-CM | POA: Insufficient documentation

## 2023-10-04 DIAGNOSIS — R2681 Unsteadiness on feet: Secondary | ICD-10-CM | POA: Diagnosis present
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
## 2023-10-05 ENCOUNTER — Encounter (HOSPITAL_BASED_OUTPATIENT_CLINIC_OR_DEPARTMENT_OTHER): Payer: Self-pay | Admitting: Physical Therapy

## 2023-10-05 ENCOUNTER — Ambulatory Visit (HOSPITAL_BASED_OUTPATIENT_CLINIC_OR_DEPARTMENT_OTHER): Attending: Adult Health | Admitting: Physical Therapy

## 2023-10-05 DIAGNOSIS — R2681 Unsteadiness on feet: Secondary | ICD-10-CM | POA: Insufficient documentation

## 2023-10-05 DIAGNOSIS — M546 Pain in thoracic spine: Secondary | ICD-10-CM | POA: Diagnosis present

## 2023-10-05 DIAGNOSIS — M5459 Other low back pain: Secondary | ICD-10-CM | POA: Insufficient documentation

## 2023-10-05 NOTE — Therapy (Signed)
 OUTPATIENT PHYSICAL THERAPY BALANCE  EVALUATION   Patient Name: Tonya Bartlett MRN: [id number] DOB:1945/10/22, 78 y.o., female Today's Date: 10/05/2023  END OF SESSION:  PT End of Session - 10/05/23 0911     Visit Number 2    Date for PT Re-Evaluation 12/27/23    Authorization Type MCR    Progress Note Due on Visit 10    PT Start Time 0848    PT Stop Time 0930    PT Time Calculation (min) 42 min    Activity Tolerance Patient limited by pain    Behavior During Therapy [facility] for tasks assessed/performed           Past Medical History:  Diagnosis Date   Alcohol abuse    Sober 41 years.   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  1996   History of hiatal hernia    History of iron deficiency anemia 1996   iron infusion   History of migraine headaches    History of radiation therapy    left breast 03/27/2021-04/23/2021 Dr Lynwood Nasuti   History of rib fracture    Hyperlipidemia    Hypertension    IBS (irritable bowel syndrome)    Insomnia    Joint pain    MCI (mild cognitive impairment)    Melanoma (HCC) 2019   left leg    Migraine headache    hx of migraines    OA (osteoarthritis)    RIGHT SHOULDER   Osteopenia    Personal history of radiation therapy    Pneumonia    remote history   PONV (postoperative nausea and vomiting)    Recovering alcoholic (HCC)    SINCE 01-21-1980   Right rotator cuff tear    SOB (shortness of breath) on exertion     Substance abuse (HCC)    recovering alcoholic   Swallowing difficulty    Unspecified essential hypertension    Past Surgical History:  Procedure Laterality Date   ATRIAL FIBRILLATION ABLATION N/A 10/13/2022   Procedure: ATRIAL FIBRILLATION ABLATION;  Surgeon: Cindie Ole DASEN, MD;  Location: [facility];  Service: Cardiovascular;  Laterality: N/A;   BREAST BIOPSY Left 01/06/2021   pos   BREAST LUMPECTOMY     BREAST LUMPECTOMY WITH RADIOACTIVE SEED LOCALIZATION Left 02/27/2021   Procedure: LEFT BREAST LUMPECTOMY WITH RADIOACTIVE SEED LOCALIZATION;  Surgeon: Aron Shoulders, MD;  Location: [facility] OR;  Service: General;  Laterality: Left;   BUNIONECTOMY/  HAMMERTOE CORRECTION  RIGHT FOOT  2011   CATARACT EXTRACTION W/ INTRAOCULAR LENS  IMPLANT, BILATERAL     COLONOSCOPY     DILATION AND CURETTAGE OF UTERUS  1976   EYE SURGERY Bilateral 2016   cataract removal   KNEE ARTHROSCOPY W/ MENISCECTOMY Bilateral X2  LEFT /    X1  RIGHT   KNEE OPEN LATERAL RELEASE Bilateral    MOHS SURGERY Left 12/15/2017   Melanoma in situ - left calf - [facility]   ORIF ANKLE FRACTURE Right 08/04/2020   Procedure: OPEN REDUCTION INTERNAL FIXATION (ORIF) ANKLE FRACTURE;  Surgeon: Kit Rush, MD;  Location: [facility];  Service: Orthopedics;  Laterality: Right;  Mini C-arm, [employer] Small Frag   REPLACEMENT TOTAL KNEE Left 2006   SHOULDER ARTHROSCOPY WITH SUBACROMIAL DECOMPRESSION, ROTATOR CUFF REPAIR AND BICEP TENDON REPAIR Right 05/23/2013   Procedure: RIGHT SHOULDER ARTHROSCOPY EXAM UNDER ANESTHESIA  WITH SUBACROMIAL DECOMPRESSION,DISTAL CLAVICLE RESECTION, SADLABRAL DEBRIDEMENT CHONDROPLASTY, BICEP TENOTOMY ;  Surgeon: Lamar Collet, MD;  Location:  [facility];  Service: Orthopedics;  Laterality: Right;   TONSILLECTOMY AND ADENOIDECTOMY  AGE 58   TOTAL HIP ARTHROPLASTY Left 05/04/2017   Procedure: LEFT TOTAL HIP ARTHROPLASTY ANTERIOR APPROACH;  Surgeon: Ernie Cough, MD;   Location: [facility];  Service: Orthopedics;  Laterality: Left;   TOTAL HIP ARTHROPLASTY Right 08/01/2019   Procedure: TOTAL HIP ARTHROPLASTY ANTERIOR APPROACH;  Surgeon: Ernie Cough, MD;  Location: [facility];  Service: Orthopedics;  Laterality: Right;    TOTAL KNEE ARTHROPLASTY Bilateral LEFT  1996/   RIGHT 2004   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      UPPER GASTROINTESTINAL ENDOSCOPY     UPPER GI ENDOSCOPY     VAGINAL HYSTERECTOMY  1976   Patient Active Problem List   Diagnosis Date Noted   Closed fracture of cervical vertebral body (HCC) 04/15/2023   Closed compression fracture of body of L1 vertebra (HCC) 04/15/2023   Vertebral compression fracture (HCC) 04/14/2023   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   Hypercoagulable state due to paroxysmal atrial fibrillation (HCC) 12/10/2022   BMI 33.0-33.9,adult 07/22/2022   BMI 32.0-32.9,adult 04/09/2022   Obesity, Beginning BMI 36.94 04/09/2022   B12 deficiency 01/29/2022   Pain of toe of right foot 12/25/2021   Pre-diabetes 11/05/2021   Essential hypertension 11/05/2021   Left knee pain 11/05/2021   Coronary artery calcification 08/07/2021   Vitamin D  deficiency 07/29/2021   Insulin  resistance 07/17/2021   Dysphagia 03/21/2021   Gastroesophageal reflux disease 03/21/2021   History of colon polyps 03/21/2021   Genetic testing 01/31/2021   Aortic atherosclerosis (HCC) 01/17/2021   Family history of melanoma 01/16/2021   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   PAF (paroxysmal atrial fibrillation) (HCC) 10/22/2020   Family history of heart disease 10/22/2020   Trimalleolar fracture 07/31/2020   Right hip OA 08/01/2019   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   Insomnia 02/03/2019   Onychomycosis 11/28/2018   Obese 05/05/2017   S/P left THA, 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   MCI (mild cognitive impairment) 09/19/2015   Memory deficit 09/19/2015   SIRS (systemic inflammatory response syndrome) (HCC) 10/29/2014   Ulnar  neuropathy of left upper extremity 04/13/2014   Depression 11/16/2013   ADD (attention deficit disorder) 11/17/2011   Essential hypertension, benign 10/15/2009   SEBORRHEIC KERATOSIS, INFLAMED 10/17/2008   CARPAL TUNNEL SYNDROME, LEFT 06/22/2008   Allergic rhinitis 08/30/2007   Hyperlipidemia 11/11/2006   Migraine 11/11/2006   Osteoarthritis 11/11/2006    PCP: Merna Huxley, NP   REFERRING PROVIDER: Merna Huxley, NP   REFERRING DIAG: R26.81 (ICD-10-CM) - Gait instability   THERAPY DIAG:  Other low back pain  Pain in thoracic spine  Unsteadiness on feet  Rationale for Evaluation and Treatment: Rehabilitation  ONSET DATE: 04/14/23  SUBJECTIVE:   SUBJECTIVE STATEMENT:  No changes since eval.  Not afraid of water  looking forward to getting in. Sharp pain 9/10; normal pain 3/10  Initial Subjective In February, had one foot in car and one out and person backed up and the door knocked her to ground. This is not the same pain that I had when I fell in Feb and broke 3 vertebrae. She had had multiple falls prior to that. No dizziness until that fall in Feb. (It's like walking on a boat). This is better since PT. The last fall was 09/22/23 and landed right on my butt and felt cracking. Walks 8-10 laps around her house 3x/day. Moving is not as bad a standing or just sitting. I've lost my appetite and have lost 25#. I get hungry about 10 pm and eat then. Onset of hand trembling in the past week.   PERTINENT HISTORY: HOH,  cervical radiculopathy, closed wedge compression fx of T12 and L1 vetebrae, Afib PAIN:  Are you having pain? Yes: NPRS scale: 8-9/10 Pain location:  middle of back B into B anterior hips Pain description: grabbing Aggravating factors: sitting or standing Relieving factors: lying down, recliner  PRECAUTIONS: Fall  RED FLAGS: None   WEIGHT BEARING RESTRICTIONS: No  FALLS:  Has patient fallen in last 6 months? Yes. Number of falls 6-8  LIVING  ENVIRONMENT: Lives with: lives alone Lives in: House/apartment Stairs: Yes: External: 7 to 10 steps; can reach both Has following equipment at home: Single point cane, Walker - 2 wheeled, Environmental consultant - 4 wheeled, Marine scientist  OCCUPATION: retired  PLOF: Independent with household mobility with device  PATIENT GOALS: to be able to walk without pain.  NEXT MD VISIT: none scheduled  OBJECTIVE:  Note: Objective measures were completed at Evaluation unless otherwise noted.  DIAGNOSTIC FINDINGS: MRI BRAIN 09/20/23  IMPRESSION:  1. Chronic ischemic white matter changes without acute intracranial abnormality.  2. C4-5 moderate spinal canal stenosis and indentation of the ventral spinal  cord.  3. C5-6 moderate left foraminal stenosis.   PATIENT SURVEYS:  PSFS: THE PATIENT SPECIFIC FUNCTIONAL SCALE  Place score of 0-10 (0 = unable to perform activity and 10 = able to perform activity at the same level as before injury or problem)  Activity Date: 10/04/23    Walk without 10    2.Lean over to brush teeth and feed dogs 5    3.Drive 10    4.      Total Score 8.3      Total Score = Sum of activity scores/number of activities  Minimally Detectable Change: 3 points (for single activity); 2 points (for average score)  [facility] (nd). The Patient Specific Functional Scale . Retrieved from [email or web address]   COGNITION: Overall cognitive status: Within functional limits for tasks assessed     SENSATION: WFL   MUSCLE LENGTH: HS, glutes and piriformis WNL  POSTURE: flexed trunk   PALPATION: B glutes near SIJ, B lumbar  LOWER EXTREMITY ROM: WFL for tasks assessed  LOWER EXTREMITY MMT: L unable to do SLR, hip ext limited by pain (bridge)  MMT Right eval Left eval  Hip flexion 4+ 2+ (4+ in available range)  Hip extension    Hip abduction 3 3  Hip adduction 5 5  Hip internal rotation    Hip external rotation     Knee flexion    Knee extension 5 3+  Ankle dorsiflexion 5 5  Ankle plantarflexion    Ankle inversion  Ankle eversion     (Blank rows = not tested)   FUNCTIONAL TESTS:  5 times sit to stand: 28.12 sec  3 MWT 423 ft   4-5 pain    GAIT: Distance walked: see above Assistive device utilized: Walker - 4 wheeled Level of assistance: Modified independence Comments: WNL                                                                                                                                TREATMENT DATE:   [facility] Adult PT Treatment:                                                DATE: 10/05/23 Pt seen for aquatic therapy today.  Treatment took place in water  3.5-4.75 ft in depth at the [facility] pool. Temp of water  was 91.  Pt entered/exited the pool via stairs using step to pattern with CGA and hand rail.  *Intro to setting *walking forward, back and side stepping in 3.6-4.3 ft with ue support of barbell. Cues for knee flex throughout pattern *Ue support on wall: toe raises; heel raises; hip add/abd; hip extension; relaxed squats x 5-10 4.0 ft *seated on lift: cycling; hip add/abd; LAQ *multiple squatting rest periods throughout *walking between exercises for recovery  Pt requires the buoyancy and hydrostatic pressure of water  for support, and to offload joints by unweighting joint load by at least 50 % in navel deep water  and by at least 75-80% in chest to neck deep water .  Viscosity of the water  is needed for resistance of strengthening. Water  current perturbations provides challenge to standing balance requiring increased core activation.     PATIENT EDUCATION:  Education details: PT eval findings, anticipated POC, and initial HEP   Person educated: Patient Education method: Explanation, Demonstration, Tactile cues, Verbal cues, and Handouts Education comprehension: verbalized understanding and returned demonstration  HOME EXERCISE PROGRAM: Advised LTR in  pain free range, glute sets   ASSESSMENT:  CLINICAL IMPRESSION: Pt demonstrates safety and independence in aquatic setting with therapist instructing from deck. Pt demonstrates confidence in setting, moving throughout all depths easily.  Pt is directed through various movement patterns and trials in both sitting and standing positions. Pt requiring repeated cues for pacing of exercise, decreased guarding of shoulders and flex of knees with gait.  Goals are ongoing.     Initial Imression Patient is a 78 y.o. female who was seen today for physical therapy evaluation and treatment for gait instability. She has had multiple falls in the past year resulting in compression fractures in her T12 and L1 vertebrae. She has minimal tolerance to standing and sitting and needed to lie down for most of the evaluation. Her pain with walking is 4-5 /10. She gets greatest relief in supine. She has marked fatigability  of B hip ABDuctors (tested in hooklying) and cannot complete a SLR on the L. Her 5xSTS indicates she is at risk for falls.Full assessment of lumbar ROM and balance was limited by pain. She is a good candidate for aquatic PT to work on strength to improve her balance and decrease pain. She will benefit from skilled PT to address these deficits and those listed below. .   OBJECTIVE IMPAIRMENTS: Abnormal gait, decreased activity tolerance, decreased balance, decreased ROM, decreased strength, increased muscle spasms, postural dysfunction, and pain.   ACTIVITY LIMITATIONS: carrying, lifting, bending, sitting, standing, squatting, sleeping, stairs, transfers, bed mobility, bathing, toileting, dressing, hygiene/grooming, and locomotion level  PARTICIPATION LIMITATIONS: meal prep, cleaning, laundry, driving, shopping, community activity, yard work, and church  PERSONAL FACTORS: Age, Time since onset of injury/illness/exacerbation, and 1-2 comorbidities: compression fractures and Afib are also affecting  patient's functional outcome.   REHAB POTENTIAL: Good  CLINICAL DECISION MAKING: Evolving/moderate complexity  EVALUATION COMPLEXITY: Moderate   GOALS: Goals reviewed with patient? Yes  SHORT TERM GOALS: Target date: 11/15/2023   Patient will be independent with initial HEP. Baseline:  Goal status: INITIAL  2.  Patient will demonstrate decreased fall risk by scoring < 25 sec on TUG. Baseline: TBD Goal status: INITIAL  3. Decreased 5xSTS by 5 sec showing improved functional strength.  Baseline: 28.12 sec Goal status: INITIAL  4.  Decreased back pain with sitting and standing by 25% or more Baseline:  Goal status: INITIAL    LONG TERM GOALS: Target date: 12/27/2023  Patient will be independent with advanced/ongoing HEP to improve outcomes and carryover.  Baseline:  Goal status: INITIAL  2.  Patient will be able to ambulate 600' with LRAD with good safety to access community.  Baseline:  Goal status: INITIAL  3.  Patient to report no falls during PT episode  Baseline:  Goal status: INITIAL   4.  Patient will demonstrate decreased 5XSTS by 10 seconds to decrease risk of falls. Baseline: 28.12 sec Goal status: INITIAL  5.  Patient will report decreased PSFS by 2 points showing functional improvement Baseline: 8 Goal status: INITIAL  6.  Decreased back pain with sitting and standing by 75% or more Baseline:  Goal status: INITIAL       PLAN:  PT FREQUENCY: 2x/week  PT DURATION: 8 weeks  PLANNED INTERVENTIONS: 97164- PT Re-evaluation, 97110-Therapeutic exercises, 97530- Therapeutic activity, 97112- Neuromuscular re-education, 97535- Self Care, [id number]- Manual therapy, [phone]- Gait training, [phone]- Aquatic Therapy, [phone]- Electrical stimulation (unattended), 20560 (1-2 muscles), 20561 (3+ muscles)- Dry Needling, Patient/Family education, Balance training, Stair training, Taping, Joint mobilization, Spinal mobilization, DME instructions, Moist heat, and  Biofeedback  PLAN FOR NEXT SESSION: Assess TUG if able, work on decreasing pain and functional strengthening to improved balance    Ronal Foots) Gaius Ishaq MPT 10/05/23 12:45 PM [facility] [address] [place], [place], [address] Phone: [phone]   Fax:  [phone]

## 2023-10-11 ENCOUNTER — Ambulatory Visit (HOSPITAL_BASED_OUTPATIENT_CLINIC_OR_DEPARTMENT_OTHER): Admitting: Physical Therapy

## 2023-10-11 DIAGNOSIS — M47812 Spondylosis without myelopathy or radiculopathy, cervical region: Secondary | ICD-10-CM | POA: Diagnosis not present

## 2023-10-11 DIAGNOSIS — M150 Primary generalized (osteo)arthritis: Secondary | ICD-10-CM | POA: Diagnosis not present

## 2023-10-11 DIAGNOSIS — L4052 Psoriatic arthritis mutilans: Secondary | ICD-10-CM | POA: Diagnosis not present

## 2023-10-11 DIAGNOSIS — G894 Chronic pain syndrome: Secondary | ICD-10-CM | POA: Diagnosis not present

## 2023-10-12 DIAGNOSIS — Z79891 Long term (current) use of opiate analgesic: Secondary | ICD-10-CM | POA: Diagnosis not present

## 2023-10-12 DIAGNOSIS — G894 Chronic pain syndrome: Secondary | ICD-10-CM | POA: Diagnosis not present

## 2023-10-13 DIAGNOSIS — S32010A Wedge compression fracture of first lumbar vertebra, initial encounter for closed fracture: Secondary | ICD-10-CM | POA: Diagnosis not present

## 2023-10-13 DIAGNOSIS — M5412 Radiculopathy, cervical region: Secondary | ICD-10-CM | POA: Diagnosis not present

## 2023-10-13 DIAGNOSIS — Z6833 Body mass index (BMI) 33.0-33.9, adult: Secondary | ICD-10-CM | POA: Diagnosis not present

## 2023-10-21 ENCOUNTER — Ambulatory Visit (HOSPITAL_BASED_OUTPATIENT_CLINIC_OR_DEPARTMENT_OTHER): Payer: Self-pay | Attending: Adult Health | Admitting: Physical Therapy

## 2023-10-21 ENCOUNTER — Encounter (HOSPITAL_BASED_OUTPATIENT_CLINIC_OR_DEPARTMENT_OTHER): Payer: Self-pay | Admitting: Physical Therapy

## 2023-10-21 DIAGNOSIS — R2681 Unsteadiness on feet: Secondary | ICD-10-CM | POA: Diagnosis present

## 2023-10-21 DIAGNOSIS — M5459 Other low back pain: Secondary | ICD-10-CM | POA: Insufficient documentation

## 2023-10-21 DIAGNOSIS — M546 Pain in thoracic spine: Secondary | ICD-10-CM | POA: Insufficient documentation
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
## 2023-10-22 ENCOUNTER — Encounter: Payer: Self-pay | Admitting: Pulmonary Disease

## 2023-10-22 ENCOUNTER — Ambulatory Visit: Admitting: Pulmonary Disease

## 2023-10-22 ENCOUNTER — Ambulatory Visit (INDEPENDENT_AMBULATORY_CARE_PROVIDER_SITE_OTHER): Admitting: Pulmonary Disease

## 2023-10-22 VITALS — BP 118/82 | HR 106 | Ht 65.0 in | Wt 206.6 lb

## 2023-10-22 DIAGNOSIS — J432 Centrilobular emphysema: Secondary | ICD-10-CM

## 2023-10-22 DIAGNOSIS — R0609 Other forms of dyspnea: Secondary | ICD-10-CM

## 2023-10-22 LAB — PULMONARY FUNCTION TEST
DL/VA % pred: 85 %
DL/VA: 3.47 ml/min/mmHg/L
DLCO cor % pred: 73 %
DLCO cor: 14.32 ml/min/mmHg
DLCO unc % pred: 69 %
DLCO unc: 13.46 ml/min/mmHg
FEF 25-75 Post: 1.41 L/s
FEF 25-75 Pre: 1.37 L/s
FEF2575-%Change-Post: 2 %
FEF2575-%Pred-Post: 93 %
FEF2575-%Pred-Pre: 90 %
FEV1-%Change-Post: 0 %
FEV1-%Pred-Post: 96 %
FEV1-%Pred-Pre: 95 %
FEV1-Post: 2 L
FEV1-Pre: 1.99 L
FEV1FVC-%Change-Post: 2 %
FEV1FVC-%Pred-Pre: 97 %
FEV6-%Change-Post: -1 %
FEV6-%Pred-Post: 101 %
FEV6-%Pred-Pre: 102 %
FEV6-Post: 2.66 L
FEV6-Pre: 2.7 L
FEV6FVC-%Change-Post: 0 %
FEV6FVC-%Pred-Post: 103 %
FEV6FVC-%Pred-Pre: 103 %
FVC-%Change-Post: -1 %
FVC-%Pred-Post: 97 %
FVC-%Pred-Pre: 98 %
FVC-Post: 2.71 L
FVC-Pre: 2.74 L
Post FEV1/FVC ratio: 74 %
Post FEV6/FVC ratio: 98 %
Pre FEV1/FVC ratio: 72 %
Pre FEV6/FVC Ratio: 98 %
RV % pred: 88 %
RV: 2.14 L
TLC % pred: 98 %
TLC: 5.1 L

## 2023-10-22 NOTE — Progress Notes (Signed)
 @Patient  ID: Tonya Bartlett, female    DOB: [birth year], 78 y.o.   MRN: [id number]  Chief Complaint  Patient presents with   Follow-up    PFT f/u    Referring provider: Merna Huxley, NP  HPI:   78 y.o. woman whom are seen for evaluation of dyspnea on exertion.  ED note in the interim reviewed.  Most recent cardiology note reviewed.  Returns for follow-up.  PFTs in interim was performed.  Normal spirometry.  Normal lung.  No bronchodilator response.  DLCO is mildly reduced but this is underestimated as lung volumes used were significant lower than TLC reported in lung volumes.  Anoro prescribed at last visit.  Using once.  Called jitteriness.  Stopped it.  For stopping of this.  Use albuterol  as needed.  Not very often.  Seems to help some.  Discussed in detail her imaging of the lungs are largely within normal limits and her pulmonary function test are largely within normal notes.  Any limitation in terms of breathing is unlikely be coming from the lungs.  HPI initial visit: Patient has had dyspnea for several months.  Ongoing workup per cardiology.  TTE 02/2023 revealed dilated left atrium, mitral valve regurgitation, aortic insufficiency.  A cardiac PET scan demonstrated an area of mild ischemia, low risk, in the mid anterior location that was reversible.  Reportedly low risk testing and clinically felt like this was maybe not as important.  Referred to pulmonary for further evaluation.  The CT scan of the chest was also obtained.  This on my review interpretation reveals clear lungs.  I really do not see any emphysema but it was reported as mild on the report.  Chest history with sound like asthma in the past.  But no real symptoms for 20+ years.  No cough.  No substernal chest pain or pressure with exertion that she can recall.  Questionaires / Pulmonary Flowsheets:   ACT:      No data to display          MMRC:     No data to display          Epworth:      No  data to display          Tests:   FENO:  No results found for: NITRICOXIDE  PFT:    Latest Ref Rng & Units 10/22/2023    9:39 AM  PFT Results  FVC-Pre L 2.74  P  FVC-Predicted Pre % 98  P  FVC-Post L 2.71  P  FVC-Predicted Post % 97  P  Pre FEV1/FVC % % 72  P  Post FEV1/FCV % % 74  P  FEV1-Pre L 1.99  P  FEV1-Predicted Pre % 95  P  FEV1-Post L 2.00  P  DLCO uncorrected ml/min/mmHg 13.46  P  DLCO UNC% % 69  P  DLCO corrected ml/min/mmHg 14.32  P  DLCO COR %Predicted % 73  P  DLVA Predicted % 85  P  TLC L 5.10  P  TLC % Predicted % 98  P  RV % Predicted % 88  P    P Preliminary result  Personally viewed there is no spirometry.  No bronchodilator sponsor.  Lung wise within the limits.  DLCO is mildly reduced but this is underestimated with lung volumes used to calculate much lower than TLC reported in lung volumes.  WALK:      No data to display  Imaging: Personally reviewed CT Head Wo Contrast Result Date: 09/22/2023 CLINICAL DATA:  Ataxia, head trauma; Ataxia, cervical trauma pt was in kitchen and stepped back and tripped over an object and fell backwards. Pts reports she hit her head on a trash can, no loc. Pt c/o lumbar back pain. EXAM: CT HEAD WITHOUT CONTRAST CT CERVICAL SPINE WITHOUT CONTRAST TECHNIQUE: Multidetector CT imaging of the head and cervical spine was performed following the standard protocol without intravenous contrast. Multiplanar CT image reconstructions of the cervical spine were also generated. RADIATION DOSE REDUCTION: This exam was performed according to the departmental dose-optimization program which includes automated exposure control, adjustment of the mA and/or kV according to patient size and/or use of iterative reconstruction technique. COMPARISON:  CT head and C-spine 04/14/2023 FINDINGS: CT HEAD FINDINGS Brain: Patchy and confluent areas of decreased attenuation are noted throughout the deep and periventricular white matter of the  cerebral hemispheres bilaterally, compatible with chronic microvascular ischemic disease. No evidence of large-territorial acute infarction. No parenchymal hemorrhage. No mass lesion. No extra-axial collection. No mass effect or midline shift. No hydrocephalus. Basilar cisterns are patent. Vascular: No hyperdense vessel. Atherosclerotic calcifications are present within the cavernous internal carotid arteries. Skull: No acute fracture or focal lesion. Sinuses/Orbits: Paranasal sinuses and mastoid air cells are clear. The orbits are unremarkable. Other: None. CT CERVICAL SPINE FINDINGS Alignment: Normal. Skull base and vertebrae: C4-C5 moderate degenerative changes of the spine. Multilevel mild degenerative changes spine. No severe osseous central canal or neural foraminal stenosis. No acute fracture. No aggressive appearing focal osseous lesion or focal pathologic process. Soft tissues and spinal canal: No prevertebral fluid or swelling. No visible canal hematoma. Upper chest: Mild emphysematous changes. Other: None. IMPRESSION: 1. No acute intracranial abnormality. 2. No acute displaced fracture or traumatic listhesis of the cervical spine. 3.  Emphysema (ICD10-J43.9). Electronically Signed   By: Morgane  Naveau M.D.   On: 09/22/2023 23:03   CT Cervical Spine Wo Contrast Result Date: 09/22/2023 CLINICAL DATA:  Ataxia, head trauma; Ataxia, cervical trauma pt was in kitchen and stepped back and tripped over an object and fell backwards. Pts reports she hit her head on a trash can, no loc. Pt c/o lumbar back pain. EXAM: CT HEAD WITHOUT CONTRAST CT CERVICAL SPINE WITHOUT CONTRAST TECHNIQUE: Multidetector CT imaging of the head and cervical spine was performed following the standard protocol without intravenous contrast. Multiplanar CT image reconstructions of the cervical spine were also generated. RADIATION DOSE REDUCTION: This exam was performed according to the departmental dose-optimization program which includes  automated exposure control, adjustment of the mA and/or kV according to patient size and/or use of iterative reconstruction technique. COMPARISON:  CT head and C-spine 04/14/2023 FINDINGS: CT HEAD FINDINGS Brain: Patchy and confluent areas of decreased attenuation are noted throughout the deep and periventricular white matter of the cerebral hemispheres bilaterally, compatible with chronic microvascular ischemic disease. No evidence of large-territorial acute infarction. No parenchymal hemorrhage. No mass lesion. No extra-axial collection. No mass effect or midline shift. No hydrocephalus. Basilar cisterns are patent. Vascular: No hyperdense vessel. Atherosclerotic calcifications are present within the cavernous internal carotid arteries. Skull: No acute fracture or focal lesion. Sinuses/Orbits: Paranasal sinuses and mastoid air cells are clear. The orbits are unremarkable. Other: None. CT CERVICAL SPINE FINDINGS Alignment: Normal. Skull base and vertebrae: C4-C5 moderate degenerative changes of the spine. Multilevel mild degenerative changes spine. No severe osseous central canal or neural foraminal stenosis. No acute fracture. No aggressive appearing focal osseous lesion or focal pathologic  process. Soft tissues and spinal canal: No prevertebral fluid or swelling. No visible canal hematoma. Upper chest: Mild emphysematous changes. Other: None. IMPRESSION: 1. No acute intracranial abnormality. 2. No acute displaced fracture or traumatic listhesis of the cervical spine. 3.  Emphysema (ICD10-J43.9). Electronically Signed   By: Morgane  Naveau M.D.   On: 09/22/2023 23:03   CT Lumbar Spine Wo Contrast Result Date: 09/22/2023 CLINICAL DATA:  Back trauma, no prior imaging (Age >= 16y) EXAM: CT LUMBAR SPINE WITHOUT CONTRAST TECHNIQUE: Multidetector CT imaging of the lumbar spine was performed without intravenous contrast administration. Multiplanar CT image reconstructions were also generated. RADIATION DOSE REDUCTION:  This exam was performed according to the departmental dose-optimization program which includes automated exposure control, adjustment of the mA and/or kV according to patient size and/or use of iterative reconstruction technique. COMPARISON:  MRI lumbar spine 07/22/2023 trauma CT lumbar spine 04/14/2023 FINDINGS: Segmentation: 5 lumbar type vertebrae. Alignment: Normal. Vertebrae: Diffusely decreased bone density. Redemonstration of T12 compression fracture with at least 90% vertebral body height loss. No acute lumbar spine fracture. No suspicious lytic or blastic lesion. Paraspinal and other soft tissues: Negative. Disc levels: Intervertebral disc space vacuum phenomenon at the L4-L5 level. Other: Moderate atherosclerotic plaque. Fluid density lesion of the left kidney likely represents a simple renal cyst. Simple renal cysts, in the absence of clinically indicated signs/symptoms, require no independent follow-up. IMPRESSION: 1. No acute displaced fracture or traumatic listhesis of the lumbar spine. 2. Redemonstration of T12 compression fracture with at least 90% vertebral body height loss. 3. Diffusely decreased bone density. Electronically Signed   By: Morgane  Naveau M.D.   On: 09/22/2023 22:58    Lab Results: Personally reviewed CBC    Component Value Date/Time   WBC 11.3 (H) 09/22/2023 2057   RBC 3.57 (L) 09/22/2023 2057   HGB 11.6 (L) 09/22/2023 2057   HGB 10.5 (L) 05/25/2023 1437   HCT 35.9 (L) 09/22/2023 2057   HCT 31.5 (L) 05/25/2023 1437   PLT 266 09/22/2023 2057   PLT 306 05/25/2023 1437   MCV 100.6 (H) 09/22/2023 2057   MCV 101 (H) 05/25/2023 1437   MCH 32.5 09/22/2023 2057   MCHC 32.3 09/22/2023 2057   RDW 13.4 09/22/2023 2057   RDW 12.5 05/25/2023 1437   LYMPHSABS 0.9 09/22/2023 2057   LYMPHSABS 1.1 08/03/2022 1116   MONOABS 0.6 09/22/2023 2057   EOSABS 0.1 09/22/2023 2057   EOSABS 0.4 08/03/2022 1116   BASOSABS 0.1 09/22/2023 2057   BASOSABS 0.1 08/03/2022 1116     BMET    Component Value Date/Time   NA 140 09/22/2023 2057   NA 142 09/23/2022 1106   K 3.5 09/22/2023 2057   CL 105 09/22/2023 2057   CO2 24 09/22/2023 2057   GLUCOSE 106 (H) 09/22/2023 2057   BUN 11 09/22/2023 2057   BUN 12 09/23/2022 1106   CREATININE 0.66 09/22/2023 2057   CREATININE 0.65 01/15/2021 1245   CREATININE 0.68 08/30/2015 1531   CALCIUM  9.3 09/22/2023 2057   GFRNONAA >60 09/22/2023 2057   GFRNONAA >60 01/15/2021 1245   GFRAA >60 08/02/2019 0306    BNP No results found for: BNP  ProBNP    Component Value Date/Time   PROBNP 219 01/20/2023 1059    Specialty Problems       Pulmonary Problems   Allergic rhinitis   Qualifier: Diagnosis of  By: Krystal MD, Reyes A        Allergies  Allergen Reactions   Cuprimine [Penicillamine] Anaphylaxis  Nsaids Anaphylaxis and Other (See Comments)    Severe stomach cramping Mouth sores   Otezla [Apremilast] Anaphylaxis    Suicidal ideation   Penicillins Anaphylaxis   Erythromycin Other (See Comments)    SEVERE STOMACH CRAMPS   Morphine And Codeine Nausea And Vomiting   Remicade  [Infliximab ] Other (See Comments) and Hypertension    Shut down immune system     Skyrizi [Risankizumab] Other (See Comments) and Cough    Upper respiratory infection Urinary tract infection Fever   Tolectin [Tolmetin] Other (See Comments)    Severe stomach cramping   Nickel Rash    Including snaps on hospital gowns     Immunization History  Administered Date(s) Administered   Fluad Quad(high Dose 65+) 10/31/2018, 04/19/2021, 12/17/2021   Fluad Trivalent(High Dose 65+) 11/17/2022   INFLUENZA, HIGH DOSE SEASONAL PF 01/02/2014, 11/06/2014, 11/15/2015, 11/05/2016, 12/02/2017   Influenza Split 11/17/2011   Influenza Whole 10/15/2009   Influenza-Unspecified 10/17/2012   PFIZER(Purple Top)SARS-COV-2 Vaccination 03/25/2019, 04/18/2019, 11/03/2019, 12/17/2021   PNEUMOCOCCAL CONJUGATE-20 04/19/2021   Pfizer Covid-19 Vaccine  Bivalent Booster 62yrs & up 04/19/2021   Pneumococcal Conjugate-13 01/02/2014   Pneumococcal Polysaccharide-23 10/15/2009   Td 08/24/2006   Tdap 07/31/2020   Zoster Recombinant(Shingrix) 12/07/2018, 02/15/2019   Zoster, Live 02/23/2015    Past Medical History:  Diagnosis Date   Alcohol abuse    Sober 41 years.   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  1996   History of hiatal hernia    History of iron deficiency anemia 1996   iron infusion   History of migraine headaches    History of radiation therapy    left breast 03/27/2021-04/23/2021 Dr Lynwood Nasuti   History of rib fracture    Hyperlipidemia    Hypertension    IBS (irritable bowel syndrome)    Insomnia    Joint pain    MCI (mild cognitive impairment)    Melanoma (HCC) 2019   left leg    Migraine headache    hx of migraines    OA (osteoarthritis)    RIGHT SHOULDER   Osteopenia    Personal history of radiation therapy    Pneumonia    remote history   PONV (postoperative nausea and vomiting)    Recovering alcoholic (HCC)    SINCE 01-21-1980   Right rotator cuff tear    SOB (shortness of breath) on exertion    Substance abuse (HCC)    recovering alcoholic   Swallowing difficulty    Unspecified essential hypertension     Tobacco History: Social History   Tobacco Use  Smoking Status Former   Current packs/day: 0.00   Average packs/day: 0.5 packs/day for 15.0 years (7.5 ttl pk-yrs)    Types: Cigarettes   Start date: 05/16/1970  Quit date: 05/15/1985   Years since quitting: 38.4  Smokeless Tobacco Never  Tobacco Comments   Former smoker 12/10/22   Counseling given: Not Answered Tobacco comments: Former smoker 12/10/22   Continue to not smoke  Outpatient Encounter Medications as of 10/22/2023  Medication Sig   acetaminophen  (TYLENOL ) 325 MG tablet Take 2 tablets (650 mg total) by mouth every 4 (four) hours as needed for mild pain (pain score 1-3) (or Fever >/= 101).   albuterol  (VENTOLIN  HFA) 108 (90 Base) MCG/ACT inhaler INHALE 2 PUFFS INTO THE LUNGS EVERY 6 (SIX) HOURS AS NEEDED FOR WHEEZING OR SHORTNESS OF BREATH (COUGH ). START WITH USING IN AM, AROUND NOON AND 1 HOUR BEFORE BEDTIME TO HELP COUGH SYMPTOMS.   atorvastatin  (LIPITOR) 40 MG tablet TAKE 1 TABLET BY MOUTH EVERY DAY   buPROPion  ER (WELLBUTRIN  SR) 100 MG 12 hr tablet Take 1 tablet (100 mg total) by mouth at bedtime.   cyclobenzaprine  (FLEXERIL ) 5 MG tablet Take 1 tablet (5 mg total) by mouth 3 (three) times daily as needed for muscle spasms.   HYDROcodone -acetaminophen  (NORCO/VICODIN) 5-325 MG tablet Take 2 tablets by mouth every 4 (four) hours as needed for moderate pain (pain score 4-6) or severe pain (pain score 7-10).   losartan  (COZAAR ) 100 MG tablet TAKE 1 TABLET BY MOUTH EVERY DAY   metoprolol  tartrate (LOPRESSOR ) 25 MG tablet TAKE 1 TABLET BY MOUTH TWICE A DAY   omeprazole  (PRILOSEC) 40 MG capsule Take 1 capsule (40 mg total) by mouth 2 (two) times daily.   polyethylene glycol (MIRALAX  / GLYCOLAX ) 17 g packet Take 17 g by mouth daily as needed for moderate constipation.   sulfamethoxazole -trimethoprim  (BACTRIM ,SEPTRA ) 400-80 MG tablet Take 1 tablet by mouth at bedtime.   SUMAtriptan  (IMITREX ) 100 MG tablet Take 1 tablet (100 mg total) by mouth every 2 (two) hours as needed for migraine. May repeat in 2 hours if headache persists or recurs.   tamoxifen  (NOLVADEX ) 10 MG tablet Take 1/2 tablet by mouth in  the evening   traZODone  (DESYREL ) 50 MG tablet TAKE 1 TABLET BY MOUTH EVERYDAY AT BEDTIME   venlafaxine  XR (EFFEXOR -XR) 150 MG 24 hr capsule Take 1 capsule (150 mg total) by mouth daily.   rizatriptan  (MAXALT ) 10 MG tablet Take 1 tablet (10 mg total) by mouth as needed for migraine. May repeat in 2 hours if needed (Patient not taking: Reported on 10/22/2023)   senna-docusate (SENOKOT-S) 8.6-50 MG tablet Take 1 tablet by mouth at bedtime as needed for mild constipation. (Patient not taking: Reported on 10/22/2023)   umeclidinium-vilanterol (ANORO ELLIPTA ) 62.5-25 MCG/ACT AEPB Inhale 1 puff into the lungs daily. (Patient not taking: Reported on 10/22/2023)   No facility-administered encounter medications on file as of 10/22/2023.     Review of Systems  Review of Systems  N/a Physical Exam  BP 118/82   Pulse (!) 106   Ht 5' 5 (1.651 m)   Wt 206 lb 9.6 oz (93.7 kg)   SpO2 97% Comment: RA  BMI 34.38 kg/m   Wt Readings from Last 5 Encounters:  10/22/23 206 lb 9.6 oz (93.7 kg)  09/28/23 209 lb (94.8 kg)  08/04/23 217 lb 12.8 oz (98.8 kg)  06/10/23 222 lb (100.7 kg)  05/25/23 222 lb (100.7 kg)    BMI Readings from Last 5 Encounters:  10/22/23 34.38 kg/m  09/28/23 33.73 kg/m  08/04/23 35.15 kg/m  06/10/23 35.83 kg/m  05/25/23 35.83 kg/m     Physical Exam General: Sitting in chair,  no acute distress Eyes: EOMI, no icterus Neck: Supple, no JVP Pulmonary: Clear, normal work of breathing Cardiovascular: Warm, no edema Abdomen: Still MSK: No synovitis no joints effusion Neuro: Ambulates with a rollator, moves all extremities Psych: Normal mood, full affect   Assessment & Plan:   Dyspnea on exertion: High suspicion for primary cardiac driver of symptoms with left atrial dilation, MVR, AI on TTE 02/2023, all of which will worsen with normal physiology of exercise.  Her CT scan demonstrates minimal if any of emphysema.  Frankly I do not really see any to my eye.  Notably she had a  area of reversible ischemia on cardiac PET scan 02/2023 albeit at low risk.  Trial of Anoro, bronchodilator therapy, given report of emphysema on CT scan to see if any therapeutic relief.  Unable to tolerate the side effects.  PFTs within normal limits.  Asthma possible felt less likely.  Encouraged ongoing exercise regimen, gradual exercise increase.  As well as ongoing efforts at weight loss.  Anemia improved.  Emphysema: Mild to not present at all, minimal to my eye.  Albuterol  as needed.   No follow-ups on file.   Donnice JONELLE Beals, MD 10/22/2023

## 2023-10-22 NOTE — Patient Instructions (Signed)
 Full pft performed today

## 2023-10-22 NOTE — Progress Notes (Signed)
 Full pft performed today

## 2023-10-26 ENCOUNTER — Encounter (HOSPITAL_BASED_OUTPATIENT_CLINIC_OR_DEPARTMENT_OTHER): Payer: Self-pay | Admitting: Physical Therapy

## 2023-10-26 ENCOUNTER — Ambulatory Visit (HOSPITAL_BASED_OUTPATIENT_CLINIC_OR_DEPARTMENT_OTHER): Admitting: Physical Therapy

## 2023-10-26 DIAGNOSIS — M546 Pain in thoracic spine: Secondary | ICD-10-CM | POA: Diagnosis not present

## 2023-10-26 DIAGNOSIS — M5459 Other low back pain: Secondary | ICD-10-CM | POA: Diagnosis not present

## 2023-10-26 DIAGNOSIS — R2681 Unsteadiness on feet: Secondary | ICD-10-CM | POA: Diagnosis not present

## 2023-10-26 NOTE — Therapy (Signed)
 OUTPATIENT PHYSICAL THERAPY  TREATMENT   Patient Name: Tonya Bartlett MRN: [id number] DOB:11/26/1945, 78 y.o., female Today's Date: 10/26/2023  END OF SESSION:  PT End of Session - 10/26/23 1717     Visit Number 4    Date for PT Re-Evaluation 12/27/23    Authorization Type MCR    Progress Note Due on Visit 10    PT Start Time 1706   pt arrived late   PT Stop Time 1745    PT Time Calculation (min) 39 min    Behavior During Therapy WFL for tasks assessed/performed           Past Medical History:  Diagnosis Date   Alcohol abuse    Sober 41 years.   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  1996   History of hiatal hernia    History of iron deficiency anemia 1996   iron infusion   History of migraine headaches    History of radiation therapy    left breast 03/27/2021-04/23/2021 Dr Lynwood Nasuti   History of rib fracture    Hyperlipidemia    Hypertension    IBS (irritable bowel syndrome)    Insomnia    Joint pain    MCI (mild cognitive impairment)    Melanoma (HCC) 2019   left leg    Migraine headache    hx of migraines    OA (osteoarthritis)    RIGHT SHOULDER   Osteopenia    Personal history of radiation therapy    Pneumonia    remote history   PONV (postoperative nausea and vomiting)    Recovering alcoholic (HCC)    SINCE 01-21-1980   Right rotator cuff tear    SOB (shortness of breath) on exertion    Substance abuse (HCC)     recovering alcoholic   Swallowing difficulty    Unspecified essential hypertension    Past Surgical History:  Procedure Laterality Date   ATRIAL FIBRILLATION ABLATION N/A 10/13/2022   Procedure: ATRIAL FIBRILLATION ABLATION;  Surgeon: Cindie Ole DASEN, MD;  Location: [facility];  Service: Cardiovascular;  Laterality: N/A;   BREAST BIOPSY Left 01/06/2021   pos   BREAST LUMPECTOMY     BREAST LUMPECTOMY WITH RADIOACTIVE SEED LOCALIZATION Left 02/27/2021   Procedure: LEFT BREAST LUMPECTOMY WITH RADIOACTIVE SEED LOCALIZATION;  Surgeon: Aron Shoulders, MD;  Location: [facility];  Service: General;  Laterality: Left;   BUNIONECTOMY/  HAMMERTOE CORRECTION  RIGHT FOOT  2011   CATARACT EXTRACTION W/ INTRAOCULAR LENS  IMPLANT, BILATERAL     COLONOSCOPY     DILATION AND CURETTAGE OF UTERUS  1976   EYE SURGERY Bilateral 2016   cataract removal   KNEE ARTHROSCOPY W/ MENISCECTOMY Bilateral X2  LEFT /    X1  RIGHT   KNEE OPEN LATERAL RELEASE Bilateral    MOHS SURGERY Left 12/15/2017   Melanoma in situ - left calf - [facility]   ORIF ANKLE FRACTURE Right 08/04/2020   Procedure: OPEN REDUCTION INTERNAL FIXATION (ORIF) ANKLE FRACTURE;  Surgeon: Kit Rush, MD;  Location: [facility];  Service: Orthopedics;  Laterality: Right;  Mini C-arm, [employer] Small [employer]ag   REPLACEMENT TOTAL KNEE Left 2006   SHOULDER ARTHROSCOPY WITH SUBACROMIAL DECOMPRESSION, ROTATOR CUFF REPAIR AND BICEP TENDON REPAIR Right 05/23/2013   Procedure: RIGHT SHOULDER ARTHROSCOPY EXAM UNDER ANESTHESIA  WITH SUBACROMIAL DECOMPRESSION,DISTAL CLAVICLE RESECTION, SADLABRAL DEBRIDEMENT CHONDROPLASTY, BICEP TENOTOMY ;  Surgeon: Lamar Collet, MD;  Location: [facility];  Service: Orthopedics;  Laterality: Right;   TONSILLECTOMY AND ADENOIDECTOMY  AGE 78   TOTAL HIP ARTHROPLASTY Left 05/04/2017   Procedure: LEFT TOTAL HIP ARTHROPLASTY ANTERIOR APPROACH;  Surgeon: Ernie Cough, MD;  Location: [facility];  Service:  Orthopedics;  Laterality: Left;   TOTAL HIP ARTHROPLASTY Right 08/01/2019   Procedure: TOTAL HIP ARTHROPLASTY ANTERIOR APPROACH;  Surgeon: Ernie Cough, MD;  Location: [facility];  Service: Orthopedics;  Laterality: Right;    TOTAL KNEE ARTHROPLASTY Bilateral LEFT  1996/   RIGHT 2004   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      UPPER GASTROINTESTINAL ENDOSCOPY     UPPER GI ENDOSCOPY     VAGINAL HYSTERECTOMY  1976   Patient Active Problem List   Diagnosis Date Noted   Closed fracture of cervical vertebral body (HCC) 04/15/2023   Closed compression fracture of body of L1 vertebra (HCC) 04/15/2023   Vertebral compression fracture (HCC) 04/14/2023   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   Hypercoagulable state due to paroxysmal atrial fibrillation (HCC) 12/10/2022   BMI 33.0-33.9,adult 07/22/2022   BMI 32.0-32.9,adult 04/09/2022   Obesity, Beginning BMI 36.94 04/09/2022   B12 deficiency 01/29/2022   Pain of toe of right foot 12/25/2021   Pre-diabetes 11/05/2021   Essential hypertension 11/05/2021   Left knee pain 11/05/2021   Coronary artery calcification 08/07/2021   Vitamin D  deficiency 07/29/2021   Insulin  resistance 07/17/2021   Dysphagia 03/21/2021   Gastroesophageal reflux disease 03/21/2021   History of colon polyps 03/21/2021   Genetic testing 01/31/2021   Aortic atherosclerosis (HCC) 01/17/2021   Family history of melanoma 01/16/2021   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   PAF (paroxysmal atrial fibrillation) (HCC) 10/22/2020   Family history of heart disease 10/22/2020   Trimalleolar fracture 07/31/2020   Right hip OA 08/01/2019   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   Insomnia 02/03/2019   Onychomycosis 11/28/2018   Obese 05/05/2017   S/P left THA, 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   MCI (mild cognitive impairment) 09/19/2015   Memory deficit 09/19/2015   SIRS (systemic inflammatory response syndrome) (HCC) 10/29/2014   Ulnar neuropathy of left upper  extremity 04/13/2014   Depression 11/16/2013   ADD (attention deficit disorder) 11/17/2011   Essential hypertension, benign 10/15/2009   SEBORRHEIC KERATOSIS, INFLAMED 10/17/2008   CARPAL TUNNEL SYNDROME, LEFT 06/22/2008   Allergic rhinitis 08/30/2007   Hyperlipidemia 11/11/2006   Migraine 11/11/2006   Osteoarthritis 11/11/2006    PCP: Merna Huxley, NP   REFERRING PROVIDER: Merna Huxley, NP   REFERRING DIAG: R26.81 (ICD-10-CM) - Gait instability   THERAPY DIAG:  Other low back pain  Pain in thoracic spine  Unsteadiness on feet  Rationale for Evaluation and Treatment: Rehabilitation  ONSET DATE: 04/14/23  SUBJECTIVE:   SUBJECTIVE STATEMENT:  Pt reports no new changes since last visit.   POOL ACCESS: [place].     Initial Subjective In February, had one foot in car and one out and person backed up and the door knocked her to ground. This is not the same pain that I had when I fell in Feb and broke 3 vertebrae. She had had multiple falls prior to that. No dizziness until that fall in Feb. (It's like walking on a boat). This is better since PT. The last fall was 09/22/23 and landed right on my butt and felt cracking. Walks 8-10 laps around her house 3x/day. Moving is not as bad a standing or just sitting. I've lost my appetite and have lost 25#. I get hungry about 10 pm and eat then. Onset of hand trembling in the past week.   PERTINENT HISTORY: HOH,  cervical radiculopathy, closed wedge compression fx of T12 and L1 vetebrae, Afib PAIN:  Are you having pain? Yes: NPRS scale: 5/10 Pain location:  middle of bil back to low back and hips Pain description: grabbing, ache Aggravating factors: sitting or standing Relieving factors: lying down, recliner  PRECAUTIONS: Fall  RED FLAGS: None   WEIGHT BEARING RESTRICTIONS: No  FALLS:  Has patient fallen in last 6 months? Yes. Number of falls 6-8  LIVING ENVIRONMENT: Lives with: lives alone Lives in:  House/apartment Stairs: Yes: External: 7 to 10 steps; can reach both Has following equipment at home: Single point cane, Walker - 2 wheeled, Environmental consultant - 4 wheeled, Marine scientist  OCCUPATION: retired  PLOF: Independent with household mobility with device  PATIENT GOALS: to be able to walk without pain.  NEXT MD VISIT: none scheduled  OBJECTIVE:  Note: Objective measures were completed at Evaluation unless otherwise noted.  DIAGNOSTIC FINDINGS: MRI BRAIN 09/20/23  IMPRESSION:  1. Chronic ischemic white matter changes without acute intracranial abnormality.  2. C4-5 moderate spinal canal stenosis and indentation of the ventral spinal  cord.  3. C5-6 moderate left foraminal stenosis.   PATIENT SURVEYS:  PSFS: THE PATIENT SPECIFIC FUNCTIONAL SCALE  Place score of 0-10 (0 = unable to perform activity and 10 = able to perform activity at the same level as before injury or problem)  Activity Date: 10/04/23    Walk without 10    2.Lean over to brush teeth and feed dogs 5    3.Drive 10    4.      Total Score 8.3      Total Score = Sum of activity scores/number of activities  Minimally Detectable Change: 3 points (for single activity); 2 points (for average score)  [facility] (nd). The Patient Specific Functional Scale . Retrieved from [email or web address]   COGNITION: Overall cognitive status: Within functional limits for tasks assessed     SENSATION: WFL   MUSCLE LENGTH: HS, glutes and piriformis WNL  POSTURE: flexed trunk   PALPATION: B glutes near SIJ, B lumbar  LOWER EXTREMITY ROM: WFL for tasks assessed  LOWER EXTREMITY MMT: L unable to do SLR, hip ext limited by pain (bridge)  MMT Right eval Left eval  Hip flexion 4+ 2+ (4+ in available range)  Hip extension    Hip abduction 3 3  Hip adduction 5 5  Hip internal rotation    Hip external rotation    Knee flexion    Knee extension 5 3+  Ankle  dorsiflexion 5 5  Ankle plantarflexion    Ankle inversion    Ankle eversion     (  Blank rows = not tested)   FUNCTIONAL TESTS:  5 times sit to stand: 28.12 sec  3 MWT 423 ft   4-5 pain    GAIT: Distance walked: see above Assistive device utilized: Walker - 4 wheeled Level of assistance: Modified independence Comments: WNL                                                                                                                                TREATMENT DATE:   [facility] Adult PT Treatment:                                                DATE: 10/26/23 Pt seen for aquatic therapy today.  Treatment took place in water  3.5-4.75 ft in depth at the [facility] pool. Temp of water  was 91.  Pt entered/exited the pool via stairs using step to pattern bil hand rail.    *UE on hand floats: walking forward 3 laps, backward 2 laps and side stepping 1 laps in 4 ft of water  * [place] L/R  * suitcase carry with bil rainbow hand floats under water  and side and walking forward/ backward * sie stepping with arm add/abdct with rainbow hand floats (good tolerance) * UE On wall: heel raises x 10; hip abdct/add 2 x 10; * TrA set with short hollow noodle pull down to thighs x 12; staggered stance x 10 each * seated on bench in water  with feet on blue step:  STS with forward arm reach and 4 count return to bench x 7 * alternating toe taps to step in water   * forward step ups with RUE on wall x 5 each    Pt requires the buoyancy and hydrostatic pressure of water  for support, and to offload joints by unweighting joint load by at least 50 % in navel deep water  and by at least 75-80% in chest to neck deep water .  Viscosity of the water  is needed for resistance of strengthening. Water  current perturbations provides challenge to standing balance requiring increased core activation.     PATIENT EDUCATION:  Education details: intro to aquatic therapy   Person educated: Patient Education method:  Explanation, Demonstration, Tactile cues, Verbal cues Education comprehension: verbalized understanding and returned demonstration  HOME EXERCISE PROGRAM: Advised LTR in pain free range, glute sets   ASSESSMENT:  CLINICAL IMPRESSION: Pt required UE support on floatation initially due to decreased balance ;improved balance with increased time in water .  Gradual reductio of pain in back to 3/10 during session.  Will continue to progress as tolerated and plan to create HEP for pt to use at [facility].  Goals are ongoing.     Initial Imression Patient is a 78 y.o. female who was seen today for physical therapy evaluation and treatment for gait instability. She has had  multiple falls in the past year resulting in compression fractures in her T12 and L1 vertebrae. She has minimal tolerance to standing and sitting and needed to lie down for most of the evaluation. Her pain with walking is 4-5 /10. She gets greatest relief in supine. She has marked fatigability of B hip ABDuctors (tested in hooklying) and cannot complete a SLR on the L. Her 5xSTS indicates she is at risk for falls.Full assessment of lumbar ROM and balance was limited by pain. She is a good candidate for aquatic PT to work on strength to improve her balance and decrease pain. She will benefit from skilled PT to address these deficits and those listed below. .   OBJECTIVE IMPAIRMENTS: Abnormal gait, decreased activity tolerance, decreased balance, decreased ROM, decreased strength, increased muscle spasms, postural dysfunction, and pain.   ACTIVITY LIMITATIONS: carrying, lifting, bending, sitting, standing, squatting, sleeping, stairs, transfers, bed mobility, bathing, toileting, dressing, hygiene/grooming, and locomotion level  PARTICIPATION LIMITATIONS: meal prep, cleaning, laundry, driving, shopping, community activity, yard work, and church  PERSONAL FACTORS: Age, Time since onset of injury/illness/exacerbation, and 1-2 comorbidities:  compression fractures and Afib are also affecting patient's functional outcome.   REHAB POTENTIAL: Good  CLINICAL DECISION MAKING: Evolving/moderate complexity  EVALUATION COMPLEXITY: Moderate   GOALS: Goals reviewed with patient? Yes  SHORT TERM GOALS: Target date: 11/15/2023   Patient will be independent with initial HEP. Baseline:  Goal status: INITIAL  2.  Patient will demonstrate decreased fall risk by scoring < 25 sec on TUG. Baseline: TBD Goal status: INITIAL  3. Decreased 5xSTS by 5 sec showing improved functional strength.  Baseline: 28.12 sec Goal status: INITIAL  4.  Decreased back pain with sitting and standing by 25% or more Baseline:  Goal status: INITIAL    LONG TERM GOALS: Target date: 12/27/2023  Patient will be independent with advanced/ongoing HEP to improve outcomes and carryover.  Baseline:  Goal status: INITIAL  2.  Patient will be able to ambulate 600' with LRAD with good safety to access community.  Baseline:  Goal status: INITIAL  3.  Patient to report no falls during PT episode  Baseline:  Goal status: INITIAL   4.  Patient will demonstrate decreased 5XSTS by 10 seconds to decrease risk of falls. Baseline: 28.12 sec Goal status: INITIAL  5.  Patient will report decreased PSFS by 2 points showing functional improvement Baseline: 8 Goal status: INITIAL  6.  Decreased back pain with sitting and standing by 75% or more Baseline:  Goal status: INITIAL       PLAN:  PT FREQUENCY: 2x/week  PT DURATION: 8 weeks  PLANNED INTERVENTIONS: 97164- PT Re-evaluation, 97110-Therapeutic exercises, 97530- Therapeutic activity, 97112- Neuromuscular re-education, 97535- Self Care, [id number]- Manual therapy, [phone]- Gait training, [phone]- Aquatic Therapy, [phone]- Electrical stimulation (unattended), 20560 (1-2 muscles), 20561 (3+ muscles)- Dry Needling, Patient/Family education, Balance training, Stair training, Taping, Joint mobilization, Spinal  mobilization, DME instructions, Moist heat, and Biofeedback  PLAN FOR NEXT SESSION: Assess TUG if able, work on decreasing pain and functional strengthening to improved balance  Delon Aquas, PTA 10/26/23 6:02 PM [facility] [address] [place], [place], [address] Phone: [phone]   Fax:  [phone]

## 2023-10-28 ENCOUNTER — Ambulatory Visit (HOSPITAL_BASED_OUTPATIENT_CLINIC_OR_DEPARTMENT_OTHER): Admitting: Physical Therapy

## 2023-10-28 ENCOUNTER — Telehealth (HOSPITAL_BASED_OUTPATIENT_CLINIC_OR_DEPARTMENT_OTHER): Payer: Self-pay | Admitting: Physical Therapy

## 2023-10-28 NOTE — Telephone Encounter (Signed)
 Patient did not show for aquatic PT appointment.  Called and left voicemail regarding missed appointment and requested return phone call to confirm next upcoming appointment.   Delon Aquas, PTA 10/28/23 3:55 PM [facility] [address] [place], [place], [address] Phone: [phone]   Fax:  [phone]

## 2023-11-02 ENCOUNTER — Ambulatory Visit (HOSPITAL_BASED_OUTPATIENT_CLINIC_OR_DEPARTMENT_OTHER): Payer: Self-pay | Admitting: Physical Therapy

## 2023-11-02 ENCOUNTER — Ambulatory Visit (HOSPITAL_BASED_OUTPATIENT_CLINIC_OR_DEPARTMENT_OTHER): Admitting: Physical Therapy

## 2023-11-02 ENCOUNTER — Encounter (HOSPITAL_BASED_OUTPATIENT_CLINIC_OR_DEPARTMENT_OTHER): Payer: Self-pay

## 2023-11-05 ENCOUNTER — Encounter: Payer: Self-pay | Admitting: Physical Therapy

## 2023-11-05 ENCOUNTER — Ambulatory Visit: Attending: Adult Health | Admitting: Physical Therapy

## 2023-11-05 DIAGNOSIS — R252 Cramp and spasm: Secondary | ICD-10-CM | POA: Insufficient documentation

## 2023-11-05 DIAGNOSIS — M6281 Muscle weakness (generalized): Secondary | ICD-10-CM | POA: Insufficient documentation

## 2023-11-05 DIAGNOSIS — M546 Pain in thoracic spine: Secondary | ICD-10-CM | POA: Insufficient documentation

## 2023-11-05 DIAGNOSIS — R2681 Unsteadiness on feet: Secondary | ICD-10-CM | POA: Diagnosis present

## 2023-11-05 DIAGNOSIS — R42 Dizziness and giddiness: Secondary | ICD-10-CM | POA: Diagnosis present

## 2023-11-05 DIAGNOSIS — M5459 Other low back pain: Secondary | ICD-10-CM | POA: Insufficient documentation

## 2023-11-05 NOTE — Therapy (Signed)
 OUTPATIENT PHYSICAL THERAPY  TREATMENT   Patient Name: Tonya Bartlett MRN: [id number] DOB:1945-05-27, 78 y.o., female Today's Date: 11/05/2023  END OF SESSION:  PT End of Session - 11/05/23 1310     Visit Number 5    Date for Recertification  12/27/23    Authorization Type MCR    Progress Note Due on Visit 10    PT Start Time 1310    PT Stop Time 1350    PT Time Calculation (min) 40 min    Activity Tolerance Patient tolerated treatment well    Behavior During Therapy WFL for tasks assessed/performed            Past Medical History:  Diagnosis Date   Alcohol abuse    Sober 41 years.   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  1996   History of hiatal hernia    History of iron deficiency anemia 1996   iron infusion   History of migraine headaches    History of radiation therapy    left breast 03/27/2021-04/23/2021 Dr Lynwood Nasuti   History of rib fracture    Hyperlipidemia    Hypertension    IBS (irritable bowel syndrome)    Insomnia    Joint pain    MCI (mild cognitive impairment)    Melanoma (HCC) 2019   left leg    Migraine headache    hx of migraines    OA (osteoarthritis)    RIGHT SHOULDER   Osteopenia    Personal history of radiation therapy    Pneumonia    remote history   PONV (postoperative nausea and vomiting)    Recovering alcoholic (HCC)    SINCE 01-21-1980   Right rotator cuff tear    SOB (shortness of breath) on  exertion    Substance abuse (HCC)    recovering alcoholic   Swallowing difficulty    Unspecified essential hypertension    Past Surgical History:  Procedure Laterality Date   ATRIAL FIBRILLATION ABLATION N/A 10/13/2022   Procedure: ATRIAL FIBRILLATION ABLATION;  Surgeon: Cindie Ole DASEN, MD;  Location: [facility];  Service: Cardiovascular;  Laterality: N/A;   BREAST BIOPSY Left 01/06/2021   pos   BREAST LUMPECTOMY     BREAST LUMPECTOMY WITH RADIOACTIVE SEED LOCALIZATION Left 02/27/2021   Procedure: LEFT BREAST LUMPECTOMY WITH RADIOACTIVE SEED LOCALIZATION;  Surgeon: Aron Shoulders, MD;  Location: [facility];  Service: General;  Laterality: Left;   BUNIONECTOMY/  HAMMERTOE CORRECTION  RIGHT FOOT  2011   CATARACT EXTRACTION W/ INTRAOCULAR LENS  IMPLANT, BILATERAL     COLONOSCOPY     DILATION AND CURETTAGE OF UTERUS  1976   EYE SURGERY Bilateral 2016   cataract removal   KNEE ARTHROSCOPY W/ MENISCECTOMY Bilateral X2  LEFT /    X1  RIGHT   KNEE OPEN LATERAL RELEASE Bilateral    MOHS SURGERY Left 12/15/2017   Melanoma in situ - left calf - [facility]   ORIF ANKLE FRACTURE Right 08/04/2020   Procedure: OPEN REDUCTION INTERNAL FIXATION (ORIF) ANKLE FRACTURE;  Surgeon: Kit Rush, MD;  Location: [facility];  Service: Orthopedics;  Laterality: Right;  Mini C-arm, [employer] Small Frag   REPLACEMENT TOTAL KNEE Left 2006   SHOULDER ARTHROSCOPY WITH SUBACROMIAL DECOMPRESSION, ROTATOR CUFF REPAIR AND BICEP TENDON REPAIR Right 05/23/2013   Procedure: RIGHT SHOULDER ARTHROSCOPY EXAM UNDER ANESTHESIA  WITH SUBACROMIAL DECOMPRESSION,DISTAL CLAVICLE RESECTION, SADLABRAL DEBRIDEMENT CHONDROPLASTY, BICEP TENOTOMY ;  Surgeon: Lamar Collet, MD;  Location: [facility] [place];  Service: Orthopedics;  Laterality: Right;   TONSILLECTOMY AND ADENOIDECTOMY  AGE 75   TOTAL HIP ARTHROPLASTY Left 05/04/2017   Procedure: LEFT TOTAL HIP ARTHROPLASTY ANTERIOR APPROACH;  Surgeon: Ernie Cough,  MD;  Location: [facility];  Service: Orthopedics;  Laterality: Left;   TOTAL HIP ARTHROPLASTY Right 08/01/2019   Procedure: TOTAL HIP ARTHROPLASTY ANTERIOR APPROACH;  Surgeon: Ernie Cough, MD;  Location: [facility];  Service: Orthopedics;  Laterality: Right;    TOTAL KNEE ARTHROPLASTY Bilateral LEFT  1996/   RIGHT 2004   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      UPPER GASTROINTESTINAL ENDOSCOPY     UPPER GI ENDOSCOPY     VAGINAL HYSTERECTOMY  1976   Patient Active Problem List   Diagnosis Date Noted   Closed fracture of cervical vertebral body (HCC) 04/15/2023   Closed compression fracture of body of L1 vertebra (HCC) 04/15/2023   Vertebral compression fracture (HCC) 04/14/2023   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   Hypercoagulable state due to paroxysmal atrial fibrillation (HCC) 12/10/2022   BMI 33.0-33.9,adult 07/22/2022   BMI 32.0-32.9,adult 04/09/2022   Obesity, Beginning BMI 36.94 04/09/2022   B12 deficiency 01/29/2022   Pain of toe of right foot 12/25/2021   Pre-diabetes 11/05/2021   Essential hypertension 11/05/2021   Left knee pain 11/05/2021   Coronary artery calcification 08/07/2021   Vitamin D  deficiency 07/29/2021   Insulin  resistance 07/17/2021   Dysphagia 03/21/2021   Gastroesophageal reflux disease 03/21/2021   History of colon polyps 03/21/2021   Genetic testing 01/31/2021   Aortic atherosclerosis (HCC) 01/17/2021   Family history of melanoma 01/16/2021   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   PAF (paroxysmal atrial fibrillation) (HCC) 10/22/2020   Family history of heart disease 10/22/2020   Trimalleolar fracture 07/31/2020   Right hip OA 08/01/2019   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   Insomnia 02/03/2019   Onychomycosis 11/28/2018   Obese 05/05/2017   S/P left THA, 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   MCI (mild cognitive impairment) 09/19/2015   Memory deficit 09/19/2015   SIRS (systemic inflammatory response syndrome) (HCC) 10/29/2014    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   Depression 11/16/2013   ADD (attention deficit disorder) 11/17/2011   Essential hypertension, benign 10/15/2009   SEBORRHEIC KERATOSIS, INFLAMED 10/17/2008   CARPAL TUNNEL SYNDROME, LEFT 06/22/2008   Allergic rhinitis 08/30/2007   Hyperlipidemia 11/11/2006   Migraine 11/11/2006   Osteoarthritis 11/11/2006    PCP: Merna Huxley, NP   REFERRING PROVIDER: Merna Huxley, NP   REFERRING DIAG: R26.81 (ICD-10-CM) - Gait instability   THERAPY DIAG:  Other low back pain  Pain in thoracic spine  Unsteadiness on feet  Cramp and spasm  Muscle weakness (generalized)  Dizziness and  giddiness  Rationale for Evaluation and Treatment: Rehabilitation  ONSET DATE: 04/14/23  SUBJECTIVE:   SUBJECTIVE STATEMENT:Back is about a 4/10 today  POOL ACCESS: [place].     Initial Subjective In February, had one foot in car and one out and person backed up and the door knocked her to ground. This is not the same pain that I had when I fell in Feb and broke 3 vertebrae. She had had multiple falls prior to that. No dizziness until that fall in Feb. (It's like walking on a boat). This is better since PT. The last fall was 09/22/23 and landed right on my butt and felt cracking. Walks 8-10 laps around her house 3x/day. Moving is not as bad a standing or just sitting. I've lost my appetite and have lost 25#. I get hungry about 10 pm and eat then. Onset of hand trembling in the past week.   PERTINENT HISTORY: HOH,  cervical radiculopathy, closed wedge compression fx of T12 and L1 vetebrae, Afib PAIN:  Are you having pain? Yes: NPRS scale: 4/10 Pain location:  middle of bil back to low back and hips Pain description: grabbing, ache Aggravating factors: sitting or standing Relieving factors: lying down, recliner  PRECAUTIONS: Fall  RED FLAGS: None   WEIGHT BEARING RESTRICTIONS: No  FALLS:  Has patient fallen in last 6 months? Yes. Number of  falls 6-8  LIVING ENVIRONMENT: Lives with: lives alone Lives in: House/apartment Stairs: Yes: External: 7 to 10 steps; can reach both Has following equipment at home: Single point cane, Walker - 2 wheeled, Environmental consultant - 4 wheeled, Marine scientist  OCCUPATION: retired  PLOF: Independent with household mobility with device  PATIENT GOALS: to be able to walk without pain.  NEXT MD VISIT: none scheduled  OBJECTIVE:  Note: Objective measures were completed at Evaluation unless otherwise noted.  DIAGNOSTIC FINDINGS: MRI BRAIN 09/20/23  IMPRESSION:  1. Chronic ischemic white matter changes without acute intracranial abnormality.  2. C4-5 moderate spinal canal stenosis and indentation of the ventral spinal  cord.  3. C5-6 moderate left foraminal stenosis.   PATIENT SURVEYS:  PSFS: THE PATIENT SPECIFIC FUNCTIONAL SCALE  Place score of 0-10 (0 = unable to perform activity and 10 = able to perform activity at the same level as before injury or problem)  Activity Date: 10/04/23    Walk without 10    2.Lean over to brush teeth and feed dogs 5    3.Drive 10    4.      Total Score 8.3      Total Score = Sum of activity scores/number of activities  Minimally Detectable Change: 3 points (for single activity); 2 points (for average score)  [facility] (nd). The Patient Specific Functional Scale . Retrieved from [email or web address]   COGNITION: Overall cognitive status: Within functional limits for tasks assessed     SENSATION: WFL   MUSCLE LENGTH: HS, glutes and piriformis WNL  POSTURE: flexed trunk   PALPATION: B glutes near SIJ, B lumbar  LOWER EXTREMITY ROM: WFL for tasks assessed  LOWER EXTREMITY MMT: L unable to do SLR, hip ext limited by pain (bridge)  MMT Right eval Left eval  Hip flexion 4+ 2+ (4+ in available range)  Hip extension    Hip abduction 3 3  Hip adduction 5 5  Hip internal rotation     Hip external rotation    Knee flexion    Knee extension 5 3+  Ankle dorsiflexion 5 5  Ankle  plantarflexion    Ankle inversion    Ankle eversion     (Blank rows = not tested)   FUNCTIONAL TESTS:  5 times sit to stand: 28.12 sec  3 MWT 423 ft   4-5 pain    GAIT: Distance walked: see above Assistive device utilized: Walker - 4 wheeled Level of assistance: Modified independence Comments: WNL                                                                                                                                TREATMENT DATE:   [facility] Adult PT Treatment:                                                DATE:   11/05/23:P t seen for aquatic therapy today.  Treatment took place in water  3.5-4.75 ft in depth at the [facility] pool. Temp of water  was 91.  Pt entered/exited the pool via stairs using step to pattern bil hand rail.    *UE on hand floats: 75% water  depth water  walking 6 lenghts of each  * grapevine L/R  * suitcase carry with bil rainbow hand floats under water  and side and walking forward/ backward 4 lengths * UE On wall: heel raises x 10; hip abdct/add 2 x 15; * TrA set with short hollow noodle pull down to thighs x 20; staggered stance x 10 each side *step ups to first step 10x Bil holding rails Pt requires buoyancy of water  for support and to offload joints with strengthening exercises.  Pt utilizes viscosity of the water  required for strengthening.    10/26/23 Pt seen for aquatic therapy today.  Treatment took place in water  3.5-4.75 ft in depth at the [facility] pool. Temp of water  was 91.  Pt entered/exited the pool via stairs using step to pattern bil hand rail.    *UE on hand floats: walking forward 3 laps, backward 2 laps and side stepping 1 laps in 4 ft of water  * grapevine L/R  * suitcase carry with bil rainbow hand floats under water  and side and walking forward/ backward * sie stepping with arm add/abdct with rainbow hand floats  (good tolerance) * UE On wall: heel raises x 10; hip abdct/add 2 x 10; * TrA set with short hollow noodle pull down to thighs x 12; staggered stance x 10 each * seated on bench in water  with feet on blue step:  STS with forward arm reach and 4 count return to bench x 7 * alternating toe taps to step in water   * forward step ups with RUE on wall x 5 each    Pt requires the buoyancy and hydrostatic pressure of water  for support, and to offload joints by unweighting joint load by at least 50 % in navel deep  water  and by at least 75-80% in chest to neck deep water .  Viscosity of the water  is needed for resistance of strengthening. Water  current perturbations provides challenge to standing balance requiring increased core activation.     PATIENT EDUCATION:  Education details: intro to aquatic therapy   Person educated: Patient Education method: Explanation, Demonstration, Tactile cues, Verbal cues Education comprehension: verbalized understanding and returned demonstration  HOME EXERCISE PROGRAM: Advised LTR in pain free range, glute sets   ASSESSMENT:  CLINICAL IMPRESSION: Pt reports exercising in the pool reduces her back by 50%. We discussed at length how the aquatic environment aides those with spinal stenosis. She verbally understood the benefits.      Initial Imression Patient is a 78 y.o. female who was seen today for physical therapy evaluation and treatment for gait instability. She has had multiple falls in the past year resulting in compression fractures in her T12 and L1 vertebrae. She has minimal tolerance to standing and sitting and needed to lie down for most of the evaluation. Her pain with walking is 4-5 /10. She gets greatest relief in supine. She has marked fatigability of B hip ABDuctors (tested in hooklying) and cannot complete a SLR on the L. Her 5xSTS indicates she is at risk for falls.Full assessment of lumbar ROM and balance was limited by pain. She is a good  candidate for aquatic PT to work on strength to improve her balance and decrease pain. She will benefit from skilled PT to address these deficits and those listed below. .   OBJECTIVE IMPAIRMENTS: Abnormal gait, decreased activity tolerance, decreased balance, decreased ROM, decreased strength, increased muscle spasms, postural dysfunction, and pain.   ACTIVITY LIMITATIONS: carrying, lifting, bending, sitting, standing, squatting, sleeping, stairs, transfers, bed mobility, bathing, toileting, dressing, hygiene/grooming, and locomotion level  PARTICIPATION LIMITATIONS: meal prep, cleaning, laundry, driving, shopping, community activity, yard work, and church  PERSONAL FACTORS: Age, Time since onset of injury/illness/exacerbation, and 1-2 comorbidities: compression fractures and Afib are also affecting patient's functional outcome.   REHAB POTENTIAL: Good  CLINICAL DECISION MAKING: Evolving/moderate complexity  EVALUATION COMPLEXITY: Moderate   GOALS: Goals reviewed with patient? Yes  SHORT TERM GOALS: Target date: 11/15/2023   Patient will be independent with initial HEP. Baseline:  Goal status: INITIAL  2.  Patient will demonstrate decreased fall risk by scoring < 25 sec on TUG. Baseline: TBD Goal status: INITIAL  3. Decreased 5xSTS by 5 sec showing improved functional strength.  Baseline: 28.12 sec Goal status: INITIAL  4.  Decreased back pain with sitting and standing by 25% or more Baseline:  Goal status: INITIAL    LONG TERM GOALS: Target date: 12/27/2023  Patient will be independent with advanced/ongoing HEP to improve outcomes and carryover.  Baseline:  Goal status: INITIAL  2.  Patient will be able to ambulate 600' with LRAD with good safety to access community.  Baseline:  Goal status: INITIAL  3.  Patient to report no falls during PT episode  Baseline:  Goal status: INITIAL   4.  Patient will demonstrate decreased 5XSTS by 10 seconds to decrease risk of  falls. Baseline: 28.12 sec Goal status: INITIAL  5.  Patient will report decreased PSFS by 2 points showing functional improvement Baseline: 8 Goal status: INITIAL  6.  Decreased back pain with sitting and standing by 75% or more Baseline:  Goal status: INITIAL       PLAN:  PT FREQUENCY: 2x/week  PT DURATION: 8 weeks  PLANNED INTERVENTIONS: 02835- PT Re-evaluation,  97110-Therapeutic exercises, 97530- Therapeutic activity, [id number]- Neuromuscular re-education, [phone]- Self Care, 02859- Manual therapy, [phone]- Gait training, [phone]- Aquatic Therapy, [phone]- Electrical stimulation (unattended), [phone] (1-2 muscles), [phone] (3+ muscles)- Dry Needling, Patient/Family education, Balance training, Stair training, Taping, Joint mobilization, Spinal mobilization, DME instructions, Moist heat, and Biofeedback  PLAN FOR NEXT SESSION:   Delon Darner, PTA 11/05/23 1:50 PM   [facility] [address] [place], [place], [address] Phone: [phone]   Fax:  [phone]

## 2023-11-06 ENCOUNTER — Other Ambulatory Visit: Payer: Self-pay | Admitting: Cardiovascular Disease

## 2023-11-09 ENCOUNTER — Encounter (HOSPITAL_BASED_OUTPATIENT_CLINIC_OR_DEPARTMENT_OTHER): Payer: Self-pay | Admitting: Physical Therapy

## 2023-11-09 ENCOUNTER — Ambulatory Visit (HOSPITAL_BASED_OUTPATIENT_CLINIC_OR_DEPARTMENT_OTHER): Admitting: Physical Therapy

## 2023-11-09 DIAGNOSIS — M5459 Other low back pain: Secondary | ICD-10-CM

## 2023-11-09 DIAGNOSIS — R2681 Unsteadiness on feet: Secondary | ICD-10-CM | POA: Diagnosis not present

## 2023-11-09 DIAGNOSIS — M546 Pain in thoracic spine: Secondary | ICD-10-CM

## 2023-11-09 NOTE — Therapy (Signed)
 OUTPATIENT PHYSICAL THERAPY  TREATMENT   Patient Name: Tonya Bartlett MRN: [id number] DOB:[birth year], 78 y.o., female Today's Date: 11/09/2023  END OF SESSION:  PT End of Session - 11/09/23 1457     Visit Number 6    Date for Recertification  12/27/23    Authorization Type MCR    Progress Note Due on Visit 10    PT Start Time 1446    PT Stop Time 1525    PT Time Calculation (min) 39 min    Activity Tolerance Patient tolerated treatment well    Behavior During Therapy [facility] for tasks assessed/performed            Past Medical History:  Diagnosis Date   Alcohol abuse    Sober 41 years.   Allergic rhinitis    Allergy    Anemia    Anxiety    Blood transfusion without reported diagnosis    Breast cancer (HCC) 12/2020   left   Cancer (HCC)    Cataract    bil cataracts removed   Clotting disorder    DVT- 1996 po knee replacement   Colitis    Complication of anesthesia    vomit x1 while on Morphine per pt   Depression    Diverticulosis    Dysrhythmia    Atrial fibrillation   Fatigue    Frequency of urination    GERD (gastroesophageal reflux disease)    History of colon polyps    History of DVT of lower extremity    POST LEFT TOTAL KNEE  1996   History of hiatal hernia    History of iron deficiency anemia 1996   iron infusion   History of migraine headaches    History of radiation therapy    left breast 03/27/2021-04/23/2021 Dr Lynwood Nasuti   History of rib fracture    Hyperlipidemia    Hypertension    IBS (irritable bowel syndrome)    Insomnia    Joint pain    MCI (mild cognitive impairment)    Melanoma (HCC) 2019   left leg    Migraine headache    hx of migraines    OA (osteoarthritis)    RIGHT SHOULDER   Osteopenia    Personal history of radiation therapy    Pneumonia    remote history   PONV (postoperative nausea and vomiting)    Recovering alcoholic (HCC)    SINCE 01-21-1980   Right rotator cuff tear    SOB (shortness of breath) on exertion     Substance abuse (HCC)    recovering alcoholic   Swallowing difficulty    Unspecified essential hypertension    Past Surgical History:  Procedure Laterality Date   ATRIAL FIBRILLATION ABLATION N/A 10/13/2022   Procedure: ATRIAL FIBRILLATION ABLATION;  Surgeon: Cindie Ole DASEN, MD;  Location: [facility];  Service: Cardiovascular;  Laterality: N/A;   BREAST BIOPSY Left 01/06/2021   pos   BREAST LUMPECTOMY     BREAST LUMPECTOMY WITH RADIOACTIVE SEED LOCALIZATION Left 02/27/2021   Procedure: LEFT BREAST LUMPECTOMY WITH RADIOACTIVE SEED LOCALIZATION;  Surgeon: Aron Shoulders, MD;  Location: [facility] OR;  Service: General;  Laterality: Left;   BUNIONECTOMY/  HAMMERTOE CORRECTION  RIGHT FOOT  2011   CATARACT EXTRACTION W/ INTRAOCULAR LENS  IMPLANT, BILATERAL     COLONOSCOPY     DILATION AND CURETTAGE OF UTERUS  1976   EYE SURGERY Bilateral 2016   cataract removal   KNEE ARTHROSCOPY W/ MENISCECTOMY Bilateral X2  LEFT /  X1  RIGHT   KNEE OPEN LATERAL RELEASE Bilateral    MOHS SURGERY Left 12/15/2017   Melanoma in situ - left calf - [facility]   ORIF ANKLE FRACTURE Right 08/04/2020   Procedure: OPEN REDUCTION INTERNAL FIXATION (ORIF) ANKLE FRACTURE;  Surgeon: Kit Rush, MD;  Location: [facility];  Service: Orthopedics;  Laterality: Right;  Mini C-arm, [employer] Small Frag   REPLACEMENT TOTAL KNEE Left 2006   SHOULDER ARTHROSCOPY WITH SUBACROMIAL DECOMPRESSION, ROTATOR CUFF REPAIR AND BICEP TENDON REPAIR Right 05/23/2013   Procedure: RIGHT SHOULDER ARTHROSCOPY EXAM UNDER ANESTHESIA  WITH SUBACROMIAL DECOMPRESSION,DISTAL CLAVICLE RESECTION, SADLABRAL DEBRIDEMENT CHONDROPLASTY, BICEP TENOTOMY ;  Surgeon: Lamar Collet, MD;  Location: [facility] ;  Service: Orthopedics;  Laterality: Right;   TONSILLECTOMY AND ADENOIDECTOMY  AGE 68   TOTAL HIP ARTHROPLASTY Left 05/04/2017   Procedure: LEFT TOTAL HIP ARTHROPLASTY ANTERIOR APPROACH;  Surgeon: Ernie Cough, MD;   Location: [facility];  Service: Orthopedics;  Laterality: Left;   TOTAL HIP ARTHROPLASTY Right 08/01/2019   Procedure: TOTAL HIP ARTHROPLASTY ANTERIOR APPROACH;  Surgeon: Ernie Cough, MD;  Location: [facility];  Service: Orthopedics;  Laterality: Right;    TOTAL KNEE ARTHROPLASTY Bilateral LEFT  1996/   RIGHT 2004   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      UPPER GASTROINTESTINAL ENDOSCOPY     UPPER GI ENDOSCOPY     VAGINAL HYSTERECTOMY  1976   Patient Active Problem List   Diagnosis Date Noted   Closed fracture of cervical vertebral body (HCC) 04/15/2023   Closed compression fracture of body of L1 vertebra (HCC) 04/15/2023   Vertebral compression fracture (HCC) 04/14/2023   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   Hypercoagulable state due to paroxysmal atrial fibrillation (HCC) 12/10/2022   BMI 33.0-33.9,adult 07/22/2022   BMI 32.0-32.9,adult 04/09/2022   Obesity, Beginning BMI 36.94 04/09/2022   B12 deficiency 01/29/2022   Pain of toe of right foot 12/25/2021   Pre-diabetes 11/05/2021   Essential hypertension 11/05/2021   Left knee pain 11/05/2021   Coronary artery calcification 08/07/2021   Vitamin D  deficiency 07/29/2021   Insulin  resistance 07/17/2021   Dysphagia 03/21/2021   Gastroesophageal reflux disease 03/21/2021   History of colon polyps 03/21/2021   Genetic testing 01/31/2021   Aortic atherosclerosis 01/17/2021   Family history of melanoma 01/16/2021   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   PAF (paroxysmal atrial fibrillation) (HCC) 10/22/2020   Family history of heart disease 10/22/2020   Trimalleolar fracture 07/31/2020   Right hip OA 08/01/2019   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   Insomnia 02/03/2019   Onychomycosis 11/28/2018   Obese 05/05/2017   S/P left THA, 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   MCI (mild cognitive impairment) 09/19/2015   Memory deficit 09/19/2015   SIRS (systemic inflammatory response syndrome) (HCC) 10/29/2014   Ulnar neuropathy  of left upper extremity 04/13/2014   Depression 11/16/2013   ADD (attention deficit disorder) 11/17/2011   Essential hypertension, benign 10/15/2009   SEBORRHEIC KERATOSIS, INFLAMED 10/17/2008   CARPAL TUNNEL SYNDROME, LEFT 06/22/2008   Allergic rhinitis 08/30/2007   Hyperlipidemia 11/11/2006   Migraine 11/11/2006   Osteoarthritis 11/11/2006    PCP: Merna Huxley, NP   REFERRING PROVIDER: Merna Huxley, NP   REFERRING DIAG: R26.81 (ICD-10-CM) - Gait instability   THERAPY DIAG:  Other low back pain  Pain in thoracic spine  Unsteadiness on feet  Rationale for Evaluation and Treatment: Rehabilitation  ONSET DATE: 04/14/23  SUBJECTIVE:   SUBJECTIVE STATEMENT: Pt  reports she had a fall in her garage on Tuesday; tripped over part of lawn mower.  This happened as she was preparing to come to therapy appointment.   Despite this, pt states, I see progress.    POOL ACCESS: [place].     Initial Subjective In February, had one foot in car and one out and person backed up and the door knocked her to ground. This is not the same pain that I had when I fell in Feb and broke 3 vertebrae. She had had multiple falls prior to that. No dizziness until that fall in Feb. (It's like walking on a boat). This is better since PT. The last fall was 09/22/23 and landed right on my butt and felt cracking. Walks 8-10 laps around her house 3x/day. Moving is not as bad a standing or just sitting. I've lost my appetite and have lost 25#. I get hungry about 10 pm and eat then. Onset of hand trembling in the past week.   PERTINENT HISTORY: HOH,  cervical radiculopathy, closed wedge compression fx of T12 and L1 vetebrae, Afib PAIN:  Are you having pain? Yes: NPRS scale: 4/10 Pain location:  middle of bil back to low back and hips Pain description: grabbing, ache Aggravating factors: sitting or standing Relieving factors: lying down, recliner  PRECAUTIONS: Fall  RED FLAGS: None   WEIGHT BEARING  RESTRICTIONS: No  FALLS:  Has patient fallen in last 6 months? Yes. Number of falls 6-8  LIVING ENVIRONMENT: Lives with: lives alone Lives in: House/apartment Stairs: Yes: External: 7 to 10 steps; can reach both Has following equipment at home: Single point cane, Walker - 2 wheeled, Environmental consultant - 4 wheeled, Marine scientist  OCCUPATION: retired  PLOF: Independent with household mobility with device  PATIENT GOALS: to be able to walk without pain. Return to water  aerobics at [facility].   NEXT MD VISIT: none scheduled  OBJECTIVE:  Note: Objective measures were completed at Evaluation unless otherwise noted.  DIAGNOSTIC FINDINGS: MRI BRAIN 09/20/23  IMPRESSION:  1. Chronic ischemic white matter changes without acute intracranial abnormality.  2. C4-5 moderate spinal canal stenosis and indentation of the ventral spinal  cord.  3. C5-6 moderate left foraminal stenosis.   PATIENT SURVEYS:  PSFS: THE PATIENT SPECIFIC FUNCTIONAL SCALE  Place score of 0-10 (0 = unable to perform activity and 10 = able to perform activity at the same level as before injury or problem)  Activity Date: 10/04/23    Walk without 10    2.Lean over to brush teeth and feed dogs 5    3.Drive 10    4.      Total Score 8.3      Total Score = Sum of activity scores/number of activities  Minimally Detectable Change: 3 points (for single activity); 2 points (for average score)  [facility] (nd). The Patient Specific Functional Scale . Retrieved from [email or web address]   COGNITION: Overall cognitive status: Within functional limits for tasks assessed     SENSATION: WFL   MUSCLE LENGTH: HS, glutes and piriformis WNL  POSTURE: flexed trunk   PALPATION: B glutes near SIJ, B lumbar  LOWER EXTREMITY ROM: WFL for tasks assessed  LOWER EXTREMITY MMT: L unable to do SLR, hip ext limited by pain (bridge)  MMT Right eval Left eval  Hip flexion  4+ 2+ (4+ in available range)  Hip extension    Hip abduction 3 3  Hip adduction 5 5  Hip internal rotation  Hip external rotation    Knee flexion    Knee extension 5 3+  Ankle dorsiflexion 5 5  Ankle plantarflexion    Ankle inversion    Ankle eversion     (Blank rows = not tested)   FUNCTIONAL TESTS:  5 times sit to stand: 28.12 sec  3 MWT 423 ft   4-5 pain    GAIT: Distance walked: see above Assistive device utilized: Walker - 4 wheeled Level of assistance: Modified independence Comments: WNL                                                                                                                                TREATMENT DATE:   [facility] Adult PT Treatment:                                                DATE: 9/23  Pt seen for aquatic therapy today.  Treatment took place in water  3.5-4.75 ft in depth at the [facility] pool. Temp of water  was 91.  Pt entered/exited the pool via stairs using step to pattern bil hand rail.    *unsupported: 75% water  depth water  walking forward/backward 3 laps * side stepping with arm add/ abdct -> with rainbow hand floats * squats pushing single rainbow hand float under water  x 10 * UE On bil rainbow hand floats,  3 way LE kick 2 x 5 each LE  * Lt runner step ups with light UE support on rails x 12 * wall push up/ push off x 10 * [place] L/R  -> water  aerobics series with 4 side steps each direction, 4 kicks, and turn 360 x 3 reps * suitcase carry with bil rainbow hand floats under water  and side and walking forward/ backward 4 lengths * staggered stance - with TrA set and hollow blue noodle pull down to thighs  Pt requires buoyancy of water  for support and to offload joints with strengthening exercises.  Pt utilizes viscosity of the water  required for strengthening.     PATIENT EDUCATION:  Education details: intro to aquatic therapy   Person educated: Patient Education method: Explanation, Demonstration, Tactile  cues, Verbal cues Education comprehension: verbalized understanding and returned demonstration  HOME EXERCISE PROGRAM: Advised LTR in pain free range, glute sets   ASSESSMENT:  CLINICAL IMPRESSION:   Pt reported some slight increase in back discomfort after side stepping with resisted arm addct; eased with change in direction. Good tolerance of all other aquatic exercises.  Pt able to self correct for small LOB in water .  Progressing towards goals. Will plan to ck STG at end of month.      Initial Imression Patient is a 78 y.o. female who was seen today for physical therapy evaluation and treatment for gait instability. She has had multiple falls  in the past year resulting in compression fractures in her T12 and L1 vertebrae. She has minimal tolerance to standing and sitting and needed to lie down for most of the evaluation. Her pain with walking is 4-5 /10. She gets greatest relief in supine. She has marked fatigability of B hip ABDuctors (tested in hooklying) and cannot complete a SLR on the L. Her 5xSTS indicates she is at risk for falls.Full assessment of lumbar ROM and balance was limited by pain. She is a good candidate for aquatic PT to work on strength to improve her balance and decrease pain. She will benefit from skilled PT to address these deficits and those listed below. .   OBJECTIVE IMPAIRMENTS: Abnormal gait, decreased activity tolerance, decreased balance, decreased ROM, decreased strength, increased muscle spasms, postural dysfunction, and pain.   ACTIVITY LIMITATIONS: carrying, lifting, bending, sitting, standing, squatting, sleeping, stairs, transfers, bed mobility, bathing, toileting, dressing, hygiene/grooming, and locomotion level  PARTICIPATION LIMITATIONS: meal prep, cleaning, laundry, driving, shopping, community activity, yard work, and church  PERSONAL FACTORS: Age, Time since onset of injury/illness/exacerbation, and 1-2 comorbidities: compression fractures and  Afib are also affecting patient's functional outcome.   REHAB POTENTIAL: Good  CLINICAL DECISION MAKING: Evolving/moderate complexity  EVALUATION COMPLEXITY: Moderate   GOALS: Goals reviewed with patient? Yes  SHORT TERM GOALS: Target date: 11/15/2023   Patient will be independent with initial HEP. Baseline:  Goal status: INITIAL  2.  Patient will demonstrate decreased fall risk by scoring < 25 sec on TUG. Baseline: TBD Goal status: INITIAL  3. Decreased 5xSTS by 5 sec showing improved functional strength.  Baseline: 28.12 sec Goal status: INITIAL  4.  Decreased back pain with sitting and standing by 25% or more Baseline:  Goal status: INITIAL    LONG TERM GOALS: Target date: 12/27/2023  Patient will be independent with advanced/ongoing HEP to improve outcomes and carryover.  Baseline:  Goal status: INITIAL  2.  Patient will be able to ambulate 600' with LRAD with good safety to access community.  Baseline:  Goal status: INITIAL  3.  Patient to report no falls during PT episode  Baseline:  Goal status: INITIAL   4.  Patient will demonstrate decreased 5XSTS by 10 seconds to decrease risk of falls. Baseline: 28.12 sec Goal status: INITIAL  5.  Patient will report decreased PSFS by 2 points showing functional improvement Baseline: 8 Goal status: INITIAL  6.  Decreased back pain with sitting and standing by 75% or more Baseline:  Goal status: INITIAL       PLAN:  PT FREQUENCY: 2x/week  PT DURATION: 8 weeks  PLANNED INTERVENTIONS: [id number]- PT Re-evaluation, 97110-Therapeutic exercises, 97530- Therapeutic activity, 97112- Neuromuscular re-education, 97535- Self Care, [id number]- Manual therapy, [id number]- Gait training, [phone]- Aquatic Therapy, [phone]- Electrical stimulation (unattended), 20560 (1-2 muscles), 20561 (3+ muscles)- Dry Needling, Patient/Family education, Balance training, Stair training, Taping, Joint mobilization, Spinal mobilization, DME instructions,  Moist heat, and Biofeedback  PLAN FOR NEXT SESSION:   Delon Aquas, PTA 11/09/23 4:37 PM [facility] [address] [place], [place], [address] Phone: [phone]   Fax:  [phone]

## 2023-11-11 ENCOUNTER — Other Ambulatory Visit: Payer: Self-pay | Admitting: Neurological Surgery

## 2023-11-11 ENCOUNTER — Ambulatory Visit (HOSPITAL_BASED_OUTPATIENT_CLINIC_OR_DEPARTMENT_OTHER): Admitting: Physical Therapy

## 2023-11-11 DIAGNOSIS — S32010A Wedge compression fracture of first lumbar vertebra, initial encounter for closed fracture: Secondary | ICD-10-CM

## 2023-11-12 ENCOUNTER — Ambulatory Visit
Admission: RE | Admit: 2023-11-12 | Discharge: 2023-11-12 | Disposition: A | Discharge status: Home / Self Care | Source: Ambulatory Visit | Attending: Neurological Surgery | Admitting: Neurological Surgery

## 2023-11-12 DIAGNOSIS — S32010A Wedge compression fracture of first lumbar vertebra, initial encounter for closed fracture: Secondary | ICD-10-CM

## 2023-11-16 ENCOUNTER — Ambulatory Visit (HOSPITAL_BASED_OUTPATIENT_CLINIC_OR_DEPARTMENT_OTHER): Admitting: Physical Therapy

## 2023-11-16 DIAGNOSIS — M5030 Other cervical disc degeneration, unspecified cervical region: Secondary | ICD-10-CM | POA: Diagnosis not present

## 2023-11-16 DIAGNOSIS — Z6833 Body mass index (BMI) 33.0-33.9, adult: Secondary | ICD-10-CM | POA: Diagnosis not present

## 2023-11-16 DIAGNOSIS — M5412 Radiculopathy, cervical region: Secondary | ICD-10-CM | POA: Diagnosis not present

## 2023-11-19 ENCOUNTER — Ambulatory Visit: Attending: Adult Health | Admitting: Physical Therapy

## 2023-11-19 DIAGNOSIS — R2681 Unsteadiness on feet: Secondary | ICD-10-CM | POA: Insufficient documentation

## 2023-11-19 DIAGNOSIS — R252 Cramp and spasm: Secondary | ICD-10-CM | POA: Insufficient documentation

## 2023-11-19 DIAGNOSIS — M546 Pain in thoracic spine: Secondary | ICD-10-CM | POA: Insufficient documentation

## 2023-11-19 DIAGNOSIS — M5459 Other low back pain: Secondary | ICD-10-CM | POA: Insufficient documentation
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
## 2023-11-23 ENCOUNTER — Telehealth (HOSPITAL_BASED_OUTPATIENT_CLINIC_OR_DEPARTMENT_OTHER): Payer: Self-pay | Admitting: Physical Therapy

## 2023-11-23 ENCOUNTER — Ambulatory Visit (HOSPITAL_BASED_OUTPATIENT_CLINIC_OR_DEPARTMENT_OTHER): Attending: Adult Health | Admitting: Physical Therapy

## 2023-11-23 DIAGNOSIS — R2681 Unsteadiness on feet: Secondary | ICD-10-CM | POA: Insufficient documentation

## 2023-11-23 DIAGNOSIS — M5459 Other low back pain: Secondary | ICD-10-CM | POA: Insufficient documentation

## 2023-11-23 DIAGNOSIS — M546 Pain in thoracic spine: Secondary | ICD-10-CM | POA: Insufficient documentation

## 2023-11-23 NOTE — Telephone Encounter (Signed)
 Patient did not show for aquatic PT appointment.  Called patient and left voicemail regarding missed appointment and requested patient call to confirm next upcoming appointment.   Per attendance policy: If patient has 2 occasions of late cancellation (less than 24 hr notice) or no-show, patient will be allowed to schedule one appointment at a time.  After the 3rd incident of late cancellation or no-show, patient will be discharged and require a new referral from physician to resume treatment.   Informed PT from [facility] of patient's missed appointment.   Delon Aquas, PTA 11/23/23 5:51 PM [facility] [address] [place], [place], [address] Phone: [phone]   Fax:  [phone]

## 2023-11-24 ENCOUNTER — Other Ambulatory Visit: Payer: Self-pay | Admitting: Adult Health

## 2023-11-26 ENCOUNTER — Ambulatory Visit: Admitting: Physical Therapy

## 2023-11-28 NOTE — Therapy (Signed)
 OUTPATIENT PHYSICAL THERAPY  TREATMENT   Patient Name: Tonya Bartlett MRN: [id number] DOB:[birth year], 78 y.o., female Today's Date: 11/29/2023  END OF SESSION:  PT End of Session - 11/29/23 1453     Visit Number 7    Date for Recertification  12/27/23    Authorization Type MCR    Progress Note Due on Visit 10    PT Start Time 1410    PT Stop Time 1453    PT Time Calculation (min) 43 min    Activity Tolerance Patient tolerated treatment well    Behavior During Therapy [facility] for tasks assessed/performed             Past Medical History:  Diagnosis Date   Alcohol abuse    Sober 41 years.   Allergic rhinitis    Allergy    Anemia    Anxiety    Blood transfusion without reported diagnosis    Breast cancer (HCC) 12/2020   left   Cancer (HCC)    Cataract    bil cataracts removed   Clotting disorder    DVT- 1996 po knee replacement   Colitis    Complication of anesthesia    vomit x1 while on Morphine per pt   Depression    Diverticulosis    Dysrhythmia    Atrial fibrillation   Fatigue    Frequency of urination    GERD (gastroesophageal reflux disease)    History of colon polyps    History of DVT of lower extremity    POST LEFT TOTAL KNEE  1996   History of hiatal hernia    History of iron deficiency anemia 1996   iron infusion   History of migraine headaches    History of radiation therapy    left breast 03/27/2021-04/23/2021 Dr Lynwood Nasuti   History of rib fracture    Hyperlipidemia    Hypertension    IBS (irritable bowel syndrome)    Insomnia    Joint pain    MCI (mild cognitive impairment)    Melanoma (HCC) 2019   left leg    Migraine headache    hx of migraines    OA (osteoarthritis)    RIGHT SHOULDER   Osteopenia    Personal history of radiation therapy    Pneumonia    remote history   PONV (postoperative nausea and vomiting)    Recovering alcoholic (HCC)    SINCE 01-21-1980   Right rotator cuff tear    SOB (shortness of breath) on exertion     Substance abuse (HCC)    recovering alcoholic   Swallowing difficulty    Unspecified essential hypertension    Past Surgical History:  Procedure Laterality Date   ATRIAL FIBRILLATION ABLATION N/A 10/13/2022   Procedure: ATRIAL FIBRILLATION ABLATION;  Surgeon: Cindie Ole DASEN, MD;  Location: [facility];  Service: Cardiovascular;  Laterality: N/A;   BREAST BIOPSY Left 01/06/2021   pos   BREAST LUMPECTOMY     BREAST LUMPECTOMY WITH RADIOACTIVE SEED LOCALIZATION Left 02/27/2021   Procedure: LEFT BREAST LUMPECTOMY WITH RADIOACTIVE SEED LOCALIZATION;  Surgeon: Aron Shoulders, MD;  Location: [facility] OR;  Service: General;  Laterality: Left;   BUNIONECTOMY/  HAMMERTOE CORRECTION  RIGHT FOOT  2011   CATARACT EXTRACTION W/ INTRAOCULAR LENS  IMPLANT, BILATERAL     COLONOSCOPY     DILATION AND CURETTAGE OF UTERUS  1976   EYE SURGERY Bilateral 2016   cataract removal   KNEE ARTHROSCOPY W/ MENISCECTOMY Bilateral X2  LEFT /    X1  RIGHT   KNEE OPEN LATERAL RELEASE Bilateral    MOHS SURGERY Left 12/15/2017   Melanoma in situ - left calf - [facility]   ORIF ANKLE FRACTURE Right 08/04/2020   Procedure: OPEN REDUCTION INTERNAL FIXATION (ORIF) ANKLE FRACTURE;  Surgeon: Kit Rush, MD;  Location: [facility];  Service: Orthopedics;  Laterality: Right;  Mini C-arm, [employer] Small [employer]ag   REPLACEMENT TOTAL KNEE Left 2006   SHOULDER ARTHROSCOPY WITH SUBACROMIAL DECOMPRESSION, ROTATOR CUFF REPAIR AND BICEP TENDON REPAIR Right 05/23/2013   Procedure: RIGHT SHOULDER ARTHROSCOPY EXAM UNDER ANESTHESIA  WITH SUBACROMIAL DECOMPRESSION,DISTAL CLAVICLE RESECTION, SADLABRAL DEBRIDEMENT CHONDROPLASTY, BICEP TENOTOMY ;  Surgeon: Lamar Collet, MD;  Location: [facility];  Service: Orthopedics;  Laterality: Right;   TONSILLECTOMY AND ADENOIDECTOMY  AGE 25   TOTAL HIP ARTHROPLASTY Left 05/04/2017   Procedure: LEFT TOTAL HIP ARTHROPLASTY ANTERIOR APPROACH;  Surgeon: Ernie Cough, MD;   Location: [facility];  Service: Orthopedics;  Laterality: Left;   TOTAL HIP ARTHROPLASTY Right 08/01/2019   Procedure: TOTAL HIP ARTHROPLASTY ANTERIOR APPROACH;  Surgeon: Ernie Cough, MD;  Location: [facility];  Service: Orthopedics;  Laterality: Right;    TOTAL KNEE ARTHROPLASTY Bilateral LEFT  1996/   RIGHT 2004   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      UPPER GASTROINTESTINAL ENDOSCOPY     UPPER GI ENDOSCOPY     VAGINAL HYSTERECTOMY  1976   Patient Active Problem List   Diagnosis Date Noted   Closed fracture of cervical vertebral body (HCC) 04/15/2023   Closed compression fracture of body of L1 vertebra (HCC) 04/15/2023   Vertebral compression fracture (HCC) 04/14/2023   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   Hypercoagulable state due to paroxysmal atrial fibrillation (HCC) 12/10/2022   BMI 33.0-33.9,adult 07/22/2022   BMI 32.0-32.9,adult 04/09/2022   Obesity, Beginning BMI 36.94 04/09/2022   B12 deficiency 01/29/2022   Pain of toe of right foot 12/25/2021   Pre-diabetes 11/05/2021   Essential hypertension 11/05/2021   Left knee pain 11/05/2021   Coronary artery calcification 08/07/2021   Vitamin D  deficiency 07/29/2021   Insulin  resistance 07/17/2021   Dysphagia 03/21/2021   Gastroesophageal reflux disease 03/21/2021   History of colon polyps 03/21/2021   Genetic testing 01/31/2021   Aortic atherosclerosis 01/17/2021   Family history of melanoma 01/16/2021   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   PAF (paroxysmal atrial fibrillation) (HCC) 10/22/2020   Family history of heart disease 10/22/2020   Trimalleolar fracture 07/31/2020   Right hip OA 08/01/2019   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   Insomnia 02/03/2019   Onychomycosis 11/28/2018   Obese 05/05/2017   S/P left THA, 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   MCI (mild cognitive impairment) 09/19/2015   Memory deficit 09/19/2015   SIRS (systemic inflammatory response syndrome) (HCC) 10/29/2014   Ulnar neuropathy  of left upper extremity 04/13/2014   Depression 11/16/2013   ADD (attention deficit disorder) 11/17/2011   Essential hypertension, benign 10/15/2009   SEBORRHEIC KERATOSIS, INFLAMED 10/17/2008   CARPAL TUNNEL SYNDROME, LEFT 06/22/2008   Allergic rhinitis 08/30/2007   Hyperlipidemia 11/11/2006   Migraine 11/11/2006   Osteoarthritis 11/11/2006    PCP: Merna Huxley, NP   REFERRING PROVIDER: Merna Huxley, NP   REFERRING DIAG: R26.81 (ICD-10-CM) - Gait instability   THERAPY DIAG:  Other low back pain  Pain in thoracic spine  Unsteadiness on feet  Cramp and spasm  Rationale for Evaluation and Treatment: Rehabilitation  ONSET  DATE: 04/14/23  SUBJECTIVE:   SUBJECTIVE STATEMENT:    My head is a little better, but not where I want it to be. I have fallen four times since my evaluation. Twice in the kitchen, once in the garage and once in the living room. The left leg is still very weak. Intermittent pain after sitting for 45 min or so. Some pain with walking. I am using a rollator and have an electric scooter for when I walk the puppies. I've lost 44# now.   POOL ACCESS: [place].     Initial Subjective In February, had one foot in car and one out and person backed up and the door knocked her to ground. This is not the same pain that I had when I fell in Feb and broke 3 vertebrae. She had had multiple falls prior to that. No dizziness until that fall in Feb. (It's like walking on a boat). This is better since PT. The last fall was 09/22/23 and landed right on my butt and felt cracking. Walks 8-10 laps around her house 3x/day. Moving is not as bad a standing or just sitting. I've lost my appetite and have lost 25#. I get hungry about 10 pm and eat then. Onset of hand trembling in the past week.   PERTINENT HISTORY: HOH,  cervical radiculopathy, closed wedge compression fx of T12 and L1 vetebrae, Afib PAIN:  Are you having pain? Yes: NPRS scale: 4/10 Pain location:  middle of bil  back to low back and hips Pain description: grabbing, ache Aggravating factors: sitting or standing Relieving factors: lying down, recliner  PRECAUTIONS: Fall  RED FLAGS: None   WEIGHT BEARING RESTRICTIONS: No  FALLS:  Has patient fallen in last 6 months? Yes. Number of falls 6-8  LIVING ENVIRONMENT: Lives with: lives alone Lives in: House/apartment Stairs: Yes: External: 7 to 10 steps; can reach both Has following equipment at home: Single point cane, Walker - 2 wheeled, Environmental consultant - 4 wheeled, Marine scientist  OCCUPATION: retired  PLOF: Independent with household mobility with device  PATIENT GOALS: to be able to walk without pain. Return to water  aerobics at [facility].   NEXT MD VISIT: none scheduled  OBJECTIVE:  Note: Objective measures were completed at Evaluation unless otherwise noted.  DIAGNOSTIC FINDINGS: MRI BRAIN 09/20/23  IMPRESSION:  1. Chronic ischemic white matter changes without acute intracranial abnormality.  2. C4-5 moderate spinal canal stenosis and indentation of the ventral spinal  cord.  3. C5-6 moderate left foraminal stenosis.   PATIENT SURVEYS:  PSFS: THE PATIENT SPECIFIC FUNCTIONAL SCALE  Place score of 0-10 (0 = unable to perform activity and 10 = able to perform activity at the same level as before injury or problem)  Activity Date: 10/04/23* 10/13   Walk without pain 0 8   2.Lean over to brush teeth and feed dogs 5 10   3.Drive 0 7   4.      Total Score 1.6 8.33    *recorded incorrecty at eval with scale reversed. Corrected 11/29/22 Total Score = Sum of activity scores/number of activities  Minimally Detectable Change: 3 points (for single activity); 2 points (for average score)  [facility] (nd). The Patient Specific Functional Scale . Retrieved from [email or web address]   COGNITION: Overall cognitive status: Within functional limits for tasks  assessed     SENSATION: WFL   MUSCLE LENGTH: HS, glutes and piriformis WNL  POSTURE: flexed trunk   PALPATION: B glutes near SIJ, B lumbar  LOWER EXTREMITY ROM: WFL for tasks assessed  LOWER EXTREMITY MMT: L unable to do SLR, hip ext limited by pain (bridge)  MMT Right eval Left eval  Hip flexion 4+ 2+ (4+ in available range)  Hip extension    Hip abduction 3 3  Hip adduction 5 5  Hip internal rotation    Hip external rotation    Knee flexion    Knee extension 5 3+  Ankle dorsiflexion 5 5  Ankle plantarflexion    Ankle inversion    Ankle eversion     (Blank rows = not tested)   FUNCTIONAL TESTS:  5 times sit to stand: 28.12 sec  3 MWT 423 ft   4-5 pain rollator  10/13:   5 times sit to stand: 14.3 sec 14.30 sec 3 MWT 252 ft   2-3 pain with SPC gait belt and CGA x 1 TUG  13.71 sec  GAIT: Distance walked: see above Assistive device utilized: Environmental consultant - 4 wheeled Level of assistance: Modified independence Comments: WNL  10/13 With SPC pt demonstrates very unsteady gait requiring CGA x 1 for 3 MWT. Marked R hip ER, decreased stability through L LE and occasional LOB backward with swing phase.                                                                                                                                 TREATMENT DATE:   [facility] Adult PT Treatment:                                                DATE:   11/29/23: See objective:  5xSTS TUG PSFS  Heel raises x 10, toes out x 10 Standing hip ABD/Ext at 45 deg back x 10 ea Seated clams red loop x 10, unilaterally x 10 (difficult to stabilize R LE) Bridge with red loop x 10 Hooklying clam red loop x 10 S/L clam red x 10 R, no band left    9/23  Pt seen for aquatic therapy today.  Treatment took place in water  3.5-4.75 ft in depth at the [facility] pool. Temp of water  was 91.  Pt entered/exited the pool via stairs using step to pattern bil hand rail.    *unsupported: 75%  water  depth water  walking forward/backward 3 laps * side stepping with arm add/ abdct -> with rainbow hand floats * squats pushing single rainbow hand float under water  x 10 * UE On bil rainbow hand floats,  3 way LE kick 2 x 5 each LE  * Lt runner step ups with light UE support on rails x 12 * wall push up/ push off x 10 * grapevine L/R  -> water  aerobics series with 4 side steps each direction, 4 kicks, and turn 360 x 3 reps * suitcase  carry with bil rainbow hand floats under water  and side and walking forward/ backward 4 lengths * staggered stance - with TrA set and hollow blue noodle pull down to thighs  Pt requires buoyancy of water  for support and to offload joints with strengthening exercises.  Pt utilizes viscosity of the water  required for strengthening.     PATIENT EDUCATION:  Education details: intro to aquatic therapy   Person educated: Patient Education method: Explanation, Demonstration, Tactile cues, Verbal cues Education comprehension: verbalized understanding and returned demonstration  HOME EXERCISE PROGRAM: Access Code: [email or web address] URL: [email or web address] Date: 11/29/2023 Prepared by: Mliss  Exercises - Sit to Stand with Counter Support  - 1 x daily - 7 x weekly - 2 sets - 10 reps - Seated Hip Abduction with Resistance  - 1 x daily - 7 x weekly - 2 sets - 10 reps - Standing Heel Raise with Support  - 1 x daily - 7 x weekly - 2 sets - 10 reps - Supine Bridge with Resistance Band  - 1 x daily - 3 x weekly - 2 sets - 10 reps  ASSESSMENT:  CLINICAL IMPRESSION:     Patient returns to PT demonstrating marked improvements in pain level and function since evaluation. She has met her STG and some LTG. She presents with [facility] and demonstrates very unsteady gait requiring CGA x 1 for 3 MWT. She has marked R hip ER, decreased stability through L LE and occasional LOB backward with swing phase. She reports 4 falls since her evaluation. She uses a 4WW in the  community and the [facility] intermittently. PT advised the RW at all times. She demonstrates significant weakness in her left gluteals and hip external rotators during exercise. She is limited with standing tolerance and required seated rest after and at end of session during scheduling. She has increased tolerance to sitting to 45 min. She continues to demonstrate potential for improvement and would benefit from continued skilled therapy to address impairments.  I recommend both aquatic and land PT. Due to attendance issues at the pool, she will have to schedule one at a time.   Initial Imression Patient is a 78 y.o. female who was seen today for physical therapy evaluation and treatment for gait instability. She has had multiple falls in the past year resulting in compression fractures in her T12 and L1 vertebrae. She has minimal tolerance to standing and sitting and needed to lie down for most of the evaluation. Her pain with walking is 4-5 /10. She gets greatest relief in supine. She has marked fatigability of B hip ABDuctors (tested in hooklying) and cannot complete a SLR on the L. Her 5xSTS indicates she is at risk for falls.Full assessment of lumbar ROM and balance was limited by pain. She is a good candidate for aquatic PT to work on strength to improve her balance and decrease pain. She will benefit from skilled PT to address these deficits and those listed below. .   OBJECTIVE IMPAIRMENTS: Abnormal gait, decreased activity tolerance, decreased balance, decreased ROM, decreased strength, increased muscle spasms, postural dysfunction, and pain.   ACTIVITY LIMITATIONS: carrying, lifting, bending, sitting, standing, squatting, sleeping, stairs, transfers, bed mobility, bathing, toileting, dressing, hygiene/grooming, and locomotion level  PARTICIPATION LIMITATIONS: meal prep, cleaning, laundry, driving, shopping, community activity, yard work, and church  PERSONAL FACTORS: Age, Time since onset of  injury/illness/exacerbation, and 1-2 comorbidities: compression fractures and Afib are also affecting patient's functional outcome.   REHAB POTENTIAL: Good  CLINICAL DECISION MAKING:  Evolving/moderate complexity  EVALUATION COMPLEXITY: Moderate   GOALS: Goals reviewed with patient? Yes  SHORT TERM GOALS: Target date: 11/15/2023   Patient will be independent with initial HEP. Baseline:  Goal status: IN PROGRESS  2.  Patient will demonstrate decreased fall risk by scoring < 25 sec on TUG. Baseline: TBD Goal status: MET  3. Decreased 5xSTS by 5 sec showing improved functional strength.  Baseline: 28.12 sec Goal status: MET  4.  Decreased back pain with sitting and standing by 25% or more Baseline:  Goal status: MET    LONG TERM GOALS: Target date: 12/27/2023  Patient will be independent with advanced/ongoing HEP to improve outcomes and carryover.  Baseline:  Goal status: IN PROGRESS  2.  Patient will be able to ambulate 600' with LRAD with good safety to access community.  Baseline:  Goal status: IN PROGRESS  3.  Patient to report no falls during PT episode  Baseline:  Goal status: NOT MET   4.  Patient will demonstrate decreased 5XSTS by 10 seconds to decrease risk of falls. Baseline: 28.12 sec Goal status: PARTIALLY MET - still requires UE use  5.  Patient will report decreased PSFS by 2 points showing functional improvement Baseline: 1.6 (see correction 10/13) Goal status:  MET  6.  Decreased back pain with sitting and standing by 75% or more Baseline:  Goal status: IN PROGRES       PLAN:  PT FREQUENCY: 2x/week  PT DURATION: 8 weeks  PLANNED INTERVENTIONS: 97164- PT Re-evaluation, 97110-Therapeutic exercises, 97530- Therapeutic activity, 97112- Neuromuscular re-education, 97535- Self Care, 02859- Manual therapy, [phone]- Gait training, [phone]- Aquatic Therapy, [phone]- Electrical stimulation (unattended), [phone] (1-2 muscles), 20561 (3+ muscles)- Dry  Needling, Patient/Family education, Balance training, Stair training, Taping, Joint mobilization, Spinal mobilization, DME instructions, Moist heat, and Biofeedback  PLAN FOR NEXT SESSION: CGA with gait; Focus on gluteal and piriformis strength, core, gait and balance, sit to stand.   Mliss Cummins, PT 11/29/23 4:16 PM   [facility] [address] [place], [place], [address] Phone: [phone]   Fax:  [phone]

## 2023-11-29 ENCOUNTER — Ambulatory Visit: Admitting: Physical Therapy

## 2023-11-29 ENCOUNTER — Encounter: Payer: Self-pay | Admitting: Physical Therapy

## 2023-11-29 DIAGNOSIS — R252 Cramp and spasm: Secondary | ICD-10-CM | POA: Diagnosis not present

## 2023-11-29 DIAGNOSIS — R2681 Unsteadiness on feet: Secondary | ICD-10-CM

## 2023-11-29 DIAGNOSIS — M546 Pain in thoracic spine: Secondary | ICD-10-CM | POA: Diagnosis not present

## 2023-11-29 DIAGNOSIS — M5459 Other low back pain: Secondary | ICD-10-CM | POA: Diagnosis not present

## 2023-12-02 ENCOUNTER — Telehealth (HOSPITAL_BASED_OUTPATIENT_CLINIC_OR_DEPARTMENT_OTHER): Payer: Self-pay | Admitting: Physical Therapy

## 2023-12-02 ENCOUNTER — Ambulatory Visit (HOSPITAL_BASED_OUTPATIENT_CLINIC_OR_DEPARTMENT_OTHER): Admitting: Physical Therapy

## 2023-12-02 NOTE — Telephone Encounter (Signed)
 Patient did not show for Aquatic PT appointment.  Called and spoke to patient.  Patient apologized and stated that she has been having memory problems and forgot about the appointment.   Informed patient that I was cancelling her aquatic appointment with myself ([facility] therapist) for 11/10, but she would need to call [facility] [phone] to inquire how appointments were going to be handled moving forward since she has no-showed to 4 appointments.   She stated she understood and agreed she would call [facility] regarding upcoming appointments.    -Informed Delon Haddock, PT, Oddis Bracket, and Nolia Muskrat of missed appointment.   Delon Aquas, PTA 12/02/23 4:03 PM [facility] [address] [place], [place], [address] Phone: [phone]   Fax:  [phone]

## 2023-12-06 ENCOUNTER — Encounter (HOSPITAL_BASED_OUTPATIENT_CLINIC_OR_DEPARTMENT_OTHER): Payer: Self-pay | Admitting: Physical Therapy

## 2023-12-06 ENCOUNTER — Ambulatory Visit (HOSPITAL_BASED_OUTPATIENT_CLINIC_OR_DEPARTMENT_OTHER): Admitting: Physical Therapy

## 2023-12-06 DIAGNOSIS — R2681 Unsteadiness on feet: Secondary | ICD-10-CM | POA: Diagnosis not present

## 2023-12-06 DIAGNOSIS — M546 Pain in thoracic spine: Secondary | ICD-10-CM | POA: Diagnosis not present

## 2023-12-06 DIAGNOSIS — M5459 Other low back pain: Secondary | ICD-10-CM

## 2023-12-06 DIAGNOSIS — L4052 Psoriatic arthritis mutilans: Secondary | ICD-10-CM | POA: Diagnosis not present

## 2023-12-06 DIAGNOSIS — M47812 Spondylosis without myelopathy or radiculopathy, cervical region: Secondary | ICD-10-CM | POA: Diagnosis not present

## 2023-12-06 DIAGNOSIS — G894 Chronic pain syndrome: Secondary | ICD-10-CM | POA: Diagnosis not present

## 2023-12-06 DIAGNOSIS — M150 Primary generalized (osteo)arthritis: Secondary | ICD-10-CM | POA: Diagnosis not present
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
## 2023-12-08 ENCOUNTER — Ambulatory Visit

## 2023-12-08 ENCOUNTER — Ambulatory Visit (HOSPITAL_BASED_OUTPATIENT_CLINIC_OR_DEPARTMENT_OTHER): Admitting: Physical Therapy

## 2023-12-14 ENCOUNTER — Telehealth (HOSPITAL_COMMUNITY): Payer: Self-pay | Admitting: *Deleted

## 2023-12-14 ENCOUNTER — Telehealth: Payer: Self-pay

## 2023-12-14 ENCOUNTER — Ambulatory Visit

## 2023-12-14 MED ORDER — BUPROPION HCL ER (SR) 100 MG PO TB12: 100.0000 mg | ORAL_TABLET | Freq: Every day | ORAL | 0 refills | Status: DC

## 2023-12-14 NOTE — Telephone Encounter (Signed)
 Call placed to patient to see if she would be coming to her 10:15 appt.  Patient has multiple no-shows.  She states that she was not planning on coming because the visit type says re-assessment and she just had that with Mliss and she thought she was going to be in the pool for the rest of her visits.  Asked if patient had her printed schedule and she retrieved this.  She apologized, she was looking at a different appt and it does say regular ortho treatment but she has an appt to take her dog to the groomer.  She really wants to keep coming because she feels like she is benefiting and getting stronger.  However, her no show record is quite high.  There was some confusion as to how her appts were not removed and there were orders missing from her visit on 10/13. Spoke with schedulers and then called patient back. On second phone call patient seems to have a different recollection of the visits and then said she knew she had land appointments too.  We discussed her memory issues briefly as she brought this up.  She admits she needs to get in to see her neurologist because this has been happening to her frequently.  Encouraged her to do so.  Explained to patient that we will have to take out all of her appointments except for the next one on 12/21/23 at 11 am (made it very clear that this was at the ortho clinic off of [address]).  This includes  the aquatics appts.  Explained that we would ask the aquatics department if she could keep her next aquatics appt on 01/05/24 but that this would likely need to be taken out due to her attendance record.  Discussed maybe having someone help her with scheduling and doing reminders for her appts.   After reviewing patients chart and seeing that she has access to the pool just down the road at the [facility], I decided to take her next aquatics appt out also.  If aquatics dept is willing to schedule one aquatics appt back, once we discuss this with them, we will try to schedule  her back one of the ones that were removed.  Will message Mliss and Delon to explain.

## 2023-12-14 NOTE — Addendum Note (Signed)
 Addended by: Taiwan Millon J on: 12/14/2023 10:57 AM   Modules accepted: Orders

## 2023-12-14 NOTE — Telephone Encounter (Signed)
 Patient called stating she schedule f/u appt with provider for 12/28/23 and would like refills for her Wellbutrin  to be sent to her pharmacy. Pharmacy is [facility] on [address].

## 2023-12-21 ENCOUNTER — Ambulatory Visit: Attending: Adult Health | Admitting: Physical Therapy

## 2023-12-21 DIAGNOSIS — M546 Pain in thoracic spine: Secondary | ICD-10-CM | POA: Diagnosis present

## 2023-12-21 DIAGNOSIS — R2681 Unsteadiness on feet: Secondary | ICD-10-CM | POA: Diagnosis present

## 2023-12-21 DIAGNOSIS — M6281 Muscle weakness (generalized): Secondary | ICD-10-CM | POA: Diagnosis present

## 2023-12-21 DIAGNOSIS — R252 Cramp and spasm: Secondary | ICD-10-CM | POA: Diagnosis present

## 2023-12-21 DIAGNOSIS — M5459 Other low back pain: Secondary | ICD-10-CM | POA: Diagnosis present

## 2023-12-21 NOTE — Therapy (Addendum)
 OUTPATIENT PHYSICAL THERAPY  TREATMENT AND PROGRESS NOTE  Reporting Period 10/04/23 to 12/21/23   See note below for Objective Data and Assessment of Progress/Goals.    Patient Name: Tonya Bartlett MRN: [id number] DOB:1945/07/21, 78 y.o., female Today's Date: 12/27/2023  END OF SESSION:    PT End of Session - 12/21/23 1147       Visit Number 9    Date for Recertification  02/22/2024    Authorization Type MCR     Progress Note Due on Visit 19     PT Start Time 1107    PT Stop Time 1147    PT Time Calculation (min) 40 min     Behavior During Therapy [facility] for tasks assessed/performed        Past Medical History:  Diagnosis Date   Alcohol abuse    Sober 41 years.   Allergic rhinitis    Allergy    Anemia    Anxiety    Blood transfusion without reported diagnosis    Breast cancer (HCC) 12/2020   left   Cancer (HCC)    Cataract    bil cataracts removed   Clotting disorder    DVT- 1996 po knee replacement   Colitis    Complication of anesthesia    vomit x1 while on Morphine per pt   Depression    Diverticulosis    Dysrhythmia    Atrial fibrillation   Fatigue    Frequency of urination    GERD (gastroesophageal reflux disease)    History of colon polyps    History of DVT of lower extremity    POST LEFT TOTAL KNEE  1996   History of hiatal hernia    History of iron deficiency anemia 1996   iron infusion   History of migraine headaches    History of radiation therapy    left breast 03/27/2021-04/23/2021 Dr Lynwood Nasuti   History of rib fracture    Hyperlipidemia    Hypertension    IBS (irritable bowel syndrome)    Insomnia    Joint pain    MCI (mild cognitive impairment)    Melanoma (HCC) 2019   left leg    Migraine headache    hx of migraines    OA (osteoarthritis)    RIGHT SHOULDER   Osteopenia    Personal history of radiation therapy    Pneumonia    remote history   PONV (postoperative nausea and vomiting)    Recovering alcoholic (HCC)    SINCE  01-21-1980   Right rotator cuff tear    SOB (shortness of breath) on exertion    Substance abuse (HCC)    recovering alcoholic   Swallowing difficulty    Unspecified essential hypertension    Past Surgical History:  Procedure Laterality Date   ATRIAL FIBRILLATION ABLATION N/A 10/13/2022   Procedure: ATRIAL FIBRILLATION ABLATION;  Surgeon: Cindie Ole DASEN, MD;  Location: [facility];  Service: Cardiovascular;  Laterality: N/A;   BREAST BIOPSY Left 01/06/2021   pos   BREAST LUMPECTOMY     BREAST LUMPECTOMY WITH RADIOACTIVE SEED LOCALIZATION Left 02/27/2021   Procedure: LEFT BREAST LUMPECTOMY WITH RADIOACTIVE SEED LOCALIZATION;  Surgeon: Aron Shoulders, MD;  Location: [facility] OR;  Service: General;  Laterality: Left;   BUNIONECTOMY/  HAMMERTOE CORRECTION  RIGHT FOOT  2011   CATARACT EXTRACTION W/ INTRAOCULAR LENS  IMPLANT, BILATERAL     COLONOSCOPY     DILATION AND CURETTAGE OF UTERUS  1976   EYE SURGERY  Bilateral 2016   cataract removal   KNEE ARTHROSCOPY W/ MENISCECTOMY Bilateral X2  LEFT /    X1  RIGHT   KNEE OPEN LATERAL RELEASE Bilateral    MOHS SURGERY Left 12/15/2017   Melanoma in situ - left calf - [facility]   ORIF ANKLE FRACTURE Right 08/04/2020   Procedure: OPEN REDUCTION INTERNAL FIXATION (ORIF) ANKLE FRACTURE;  Surgeon: Kit Rush, MD;  Location: [facility];  Service: Orthopedics;  Laterality: Right;  Mini C-arm, [employer] Small Frag   REPLACEMENT TOTAL KNEE Left 2006   SHOULDER ARTHROSCOPY WITH SUBACROMIAL DECOMPRESSION, ROTATOR CUFF REPAIR AND BICEP TENDON REPAIR Right 05/23/2013   Procedure: RIGHT SHOULDER ARTHROSCOPY EXAM UNDER ANESTHESIA  WITH SUBACROMIAL DECOMPRESSION,DISTAL CLAVICLE RESECTION, SADLABRAL DEBRIDEMENT CHONDROPLASTY, BICEP TENOTOMY ;  Surgeon: Lamar Collet, MD;  Location: [facility];  Service: Orthopedics;  Laterality: Right;   TONSILLECTOMY AND ADENOIDECTOMY  AGE 66   TOTAL HIP ARTHROPLASTY Left 05/04/2017   Procedure:  LEFT TOTAL HIP ARTHROPLASTY ANTERIOR APPROACH;  Surgeon: Ernie Cough, MD;  Location: [facility];  Service: Orthopedics;  Laterality: Left;   TOTAL HIP ARTHROPLASTY Right 08/01/2019   Procedure: TOTAL HIP ARTHROPLASTY ANTERIOR APPROACH;  Surgeon: Ernie Cough, MD;  Location: [facility];  Service: Orthopedics;  Laterality: Right;    TOTAL KNEE ARTHROPLASTY Bilateral LEFT  1996/   RIGHT 2004   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      UPPER GASTROINTESTINAL ENDOSCOPY     UPPER GI ENDOSCOPY     VAGINAL HYSTERECTOMY  1976   Patient Active Problem List   Diagnosis Date Noted   Closed fracture of cervical vertebral body (HCC) 04/15/2023   Closed compression fracture of body of L1 vertebra (HCC) 04/15/2023   Vertebral compression fracture (HCC) 04/14/2023   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   Hypercoagulable state due to paroxysmal atrial fibrillation (HCC) 12/10/2022   BMI 33.0-33.9,adult 07/22/2022   BMI 32.0-32.9,adult 04/09/2022   Obesity, Beginning BMI 36.94 04/09/2022   B12 deficiency 01/29/2022   Pain of toe of right foot 12/25/2021   Pre-diabetes 11/05/2021   Essential hypertension 11/05/2021   Left knee pain 11/05/2021   Coronary artery calcification 08/07/2021   Vitamin D  deficiency 07/29/2021   Insulin  resistance 07/17/2021   Dysphagia 03/21/2021   Gastroesophageal reflux disease 03/21/2021   History of colon polyps 03/21/2021   Genetic testing 01/31/2021   Aortic atherosclerosis 01/17/2021   Family history of melanoma 01/16/2021   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   PAF (paroxysmal atrial fibrillation) (HCC) 10/22/2020   Family history of heart disease 10/22/2020   Trimalleolar fracture 07/31/2020   Right hip OA 08/01/2019   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   Insomnia 02/03/2019   Onychomycosis 11/28/2018   Obese 05/05/2017   S/P left THA, 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   MCI (mild cognitive impairment) 09/19/2015   Memory deficit 09/19/2015   SIRS  (systemic inflammatory response syndrome) (HCC) 10/29/2014   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   Depression 11/16/2013   ADD (attention deficit disorder) 11/17/2011   Essential hypertension, benign 10/15/2009   SEBORRHEIC KERATOSIS, INFLAMED 10/17/2008   CARPAL TUNNEL SYNDROME, LEFT 06/22/2008   Allergic rhinitis 08/30/2007   Hyperlipidemia 11/11/2006   Migraine 11/11/2006   Osteoarthritis 11/11/2006    PCP: Merna Huxley, NP   REFERRING PROVIDER: Merna Huxley, NP   REFERRING DIAG: R26.81 (ICD-10-CM) - Gait instability   THERAPY DIAG:  Other low back pain  Pain in thoracic spine  Unsteadiness  on feet  Cramp and spasm  Muscle weakness (generalized)  Rationale for Evaluation and Treatment: Rehabilitation  ONSET DATE: 04/14/23  SUBJECTIVE:   SUBJECTIVE STATEMENT:  I'm better, but my balance is not better. No pain with rollator. Fell twice last week, Thursday and Friday night.  I fell in the yard. Army crawled 20 feet at 3 am in the morning. Then pushes herself up on the steps. Fell in the sunroom. I had set the walker aside to open the door, got my feet caught up in the [place] pads. I  crawled to the door and opened it so I could get to the stairs. If I can get my feet underneath me, I can get up. Seeing neurologist   POOL ACCESS: [place].     Initial Subjective In February, had one foot in car and one out and person backed up and the door knocked her to ground. This is not the same pain that I had when I fell in Feb and broke 3 vertebrae. She had had multiple falls prior to that. No dizziness until that fall in Feb. (It's like walking on a boat). This is better since PT. The last fall was 09/22/23 and landed right on my butt and felt cracking. Walks 8-10 laps around her house 3x/day. Moving is not as bad a standing or just sitting. I've lost my appetite and have lost 25#. I get hungry about 10 pm and eat then. Onset of hand trembling in the past week.    PERTINENT HISTORY: HOH,  cervical radiculopathy, closed wedge compression fx of T12 and L1 vetebrae, Afib PAIN:  Are you having pain? Yes: NPRS scale: 0/10 Pain location:  Rt lower back at waist Pain description: grabbing, ache Aggravating factors: sitting or standing Relieving factors: lying down, recliner  PRECAUTIONS: Fall  RED FLAGS: None   WEIGHT BEARING RESTRICTIONS: No  FALLS:  Has patient fallen in last 6 months? Yes. Number of falls 6-8  LIVING ENVIRONMENT: Lives with: lives alone Lives in: House/apartment Stairs: Yes: External: 7 to 10 steps; can reach both Has following equipment at home: Single point cane, Walker - 2 wheeled, Environmental Consultant - 4 wheeled, Marine Scientist  OCCUPATION: retired  PLOF: Independent with household mobility with device  PATIENT GOALS: to be able to walk without pain. Return to [facility]  aerobics at [facility].   NEXT MD VISIT: none scheduled  OBJECTIVE:  Note: Objective measures were completed at Evaluation unless otherwise noted.  DIAGNOSTIC FINDINGS: MRI BRAIN 09/20/23  IMPRESSION:  1. Chronic ischemic white matter changes without acute intracranial abnormality.  2. C4-5 moderate spinal canal stenosis and indentation of the ventral spinal  cord.  3. C5-6 moderate left foraminal stenosis.   PATIENT SURVEYS:  PSFS: THE PATIENT SPECIFIC FUNCTIONAL SCALE  Place score of 0-10 (0 = unable to perform activity and 10 = able to perform activity at the same level as before injury or problem)  Activity Date: 10/04/23* 10/13   Walk without pain 0 8   2.Lean over to brush teeth and feed dogs 5 10   3.Drive 0 7   4.      Total Score 1.6 8.33    *recorded incorrecty at eval with scale reversed. Corrected 11/29/22 Total Score = Sum of activity scores/number of activities  Minimally Detectable Change: 3 points (for single activity); 2 points (for average score)  [facility] (nd). The Patient Specific Functional Scale . Retrieved from  [email or web address]   COGNITION: Overall cognitive status: Within functional  limits for tasks assessed     SENSATION: WFL   MUSCLE LENGTH: HS, glutes and piriformis WNL  POSTURE: flexed trunk   PALPATION: B glutes near SIJ, B lumbar  LOWER EXTREMITY ROM: WFL for tasks assessed  LOWER EXTREMITY MMT: L unable to do SLR, hip ext limited by pain (bridge)  MMT Right eval Left eval  Hip flexion 4+ 2+ (4+ in available range)  Hip extension    Hip abduction 3 3  Hip adduction 5 5  Hip internal rotation    Hip external rotation    Knee flexion    Knee extension 5 3+  Ankle dorsiflexion 5 5  Ankle plantarflexion    Ankle inversion    Ankle eversion     (Blank rows = not tested)   FUNCTIONAL TESTS:  5 times sit to stand: 28.12 sec  3 MWT 423 ft   4-5 pain rollator  10/13:   5 times sit to stand: 14.3 sec 14.30 sec 3 MWT 252 ft   2-3 pain with SPC gait belt and CGA x 1 TUG  13.71 sec  12/21/23 5xSTS 24.08 sec with no UE support 960 ft - one incidence of mis-step when turned head (at around 800  ft)  GAIT: Distance walked: see above Assistive device utilized: Environmental Consultant - 4 wheeled Level of assistance: Modified independence Comments: WNL  10/13 With SPC pt demonstrates very unsteady gait requiring CGA x 1 for 3 MWT. Marked R hip ER, decreased stability through L LE and occasional LOB backward with swing phase.                                                                                                                                 TREATMENT DATE:   12/21/23  5XSTS  24.08 sec  Worked on Floor to stand transfer Discussion of POC     10/20  Pt seen for aquatic therapy today.  Treatment took place in water  3.5-4.75 ft in depth at the [facility] pool. Temp of water  was 91.  Pt entered/exited the pool via stairs using step to pattern bil hand rail.    *unsupported: 75% water  depth water   walking forward/backward 3.5 laps * side stepping with arm add/ abdct -> with rainbow hand floats (increased pain)-> without floats, with relaxed LEs (pain reduced) * suitcase carry with bil/single rainbow hand floats under water  at side and walking forward/ backward  * wall push up/ push off x 10 * UE On bil rainbow hand floats:  3 way LE kick x 5 each LE  -> with UE on wall x 5 (less LBP) * squats pushing single rainbow hand float under water  x 12 * Lt forward step ups (Rt retro step down) with light UE support on rails x 10 (cues for controlled descent)  * Lt lateral step ups x 10 * STS from 3nd step in water , with forward arm reach and slow descent *  grapevine L/R (increased back pain)  *TrA set and hollow blue noodle pull down to thighs x 10  [facility] Adult PT Treatment:                                                DATE: 11/29/23: See objective:  5xSTS TUG PSFS  Heel raises x 10, toes out x 10 Standing hip ABD/Ext at 45 deg back x 10 ea Seated clams red loop x 10, unilaterally x 10 (difficult to stabilize R LE) Bridge with red loop x 10 Hooklying clam red loop x 10 S/L clam red x 10 R, no band left   PATIENT EDUCATION:  Education details: exercise rationale, progression, modification  Person educated: Patient Education method: Explanation, Demonstration, Tactile cues, Verbal cues Education comprehension: verbalized understanding and returned demonstration  HOME EXERCISE PROGRAM: Access Code: [email or web address] URL: [email or web address] Date: 11/29/2023 Prepared by: Mliss  Exercises - Sit to Stand with Counter Support  - 1 x daily - 7 x weekly - 2 sets - 10 reps - Seated Hip Abduction with Resistance  - 1 x daily - 7 x weekly - 2 sets - 10 reps - Standing Heel Raise with Support  - 1 x daily - 7 x weekly - 2 sets - 10 reps - Supine Bridge with Resistance Band  - 1 x daily - 3 x weekly - 2 sets - 10 reps  ASSESSMENT:  CLINICAL IMPRESSION:  Patient reports 2  falls in the past week. She sees a neurologist soon.  She has had two falls in the past week (see subjective). Due to this we worked on getting off the floor. Patient was able to do with assist of a chair, but demonstrates significant triceps and pectoralis weakness. She cannot push up into a cobra from prone position or push her self backward into quadruped. She was able to do this from her elbows. Patient states she does counter push ups at home and she was advised to continue. Patient was able to complete 600 ft walking safely, but did experience one mis-step when she turned to look in a room. Patient has met her LTGs and she is pleased with her function other than balance. She is going to a facility tomorrow for balance evaluation.  She plans to call to let us  know if she wants to address balance here or there after her evaluation. If she returns, she would benefit from UE strengthening for floor transfers as well as balance.     Initial Imression Patient is a 78 y.o. female who was seen today for physical therapy evaluation and treatment for gait instability. She has had multiple falls in the past year resulting in compression fractures in her T12 and L1 vertebrae. She has minimal tolerance to standing and sitting and needed to lie down for most of the evaluation. Her pain with walking is 4-5 /10. She gets greatest relief in supine. She has marked fatigability of B hip ABDuctors (tested in hooklying) and cannot complete a SLR on the L. Her 5xSTS indicates she is at risk for falls.Full assessment of lumbar ROM and balance was limited by pain. She is a good candidate for aquatic PT to work on strength to improve her balance and decrease pain. She will benefit from skilled PT to address these deficits and those listed below. .   OBJECTIVE IMPAIRMENTS: Abnormal  gait, decreased activity tolerance, decreased balance, decreased ROM, decreased strength, increased muscle spasms, postural dysfunction, and pain.    ACTIVITY LIMITATIONS: carrying, lifting, bending, sitting, standing, squatting, sleeping, stairs, transfers, bed mobility, bathing, toileting, dressing, hygiene/grooming, and locomotion level  PARTICIPATION LIMITATIONS: meal prep, cleaning, laundry, driving, shopping, community activity, yard work, and church  PERSONAL FACTORS: Age, Time since onset of injury/illness/exacerbation, and 1-2 comorbidities: compression fractures and Afib are also affecting patient's functional outcome.   REHAB POTENTIAL: Good  CLINICAL DECISION MAKING: Evolving/moderate complexity  EVALUATION COMPLEXITY: Moderate   GOALS: Goals reviewed with patient? Yes  SHORT TERM GOALS: Target date: 11/15/2023   Patient will be independent with initial HEP. Baseline:  Goal status: IN PROGRESS  2.  Patient will demonstrate decreased fall risk by scoring < 25 sec on TUG. Baseline: TBD Goal status: MET  3. Decreased 5xSTS by 5 sec showing improved functional strength.  Baseline: 28.12 sec Goal status: MET  4.  Decreased back pain with sitting and standing by 25% or more Baseline:  Goal status: MET    LONG TERM GOALS: Target date: 02/22/24  Patient will be independent with advanced/ongoing HEP to improve outcomes and carryover.  Baseline:  Goal status: MET  2.  Patient will be able to ambulate 600' with LRAD with good safety to access community.  Baseline:  Goal status: MET  3.  Patient to report no falls during PT episode  Baseline:  Goal status: NOT MET 11/4  4.  Patient will demonstrate decreased 5XSTS by 10 seconds to decrease risk of falls. Baseline: 28.12 sec Goal status: PARTIALLY MET - met for time with UE assist; She was also able to complete 5XSTS with no UE assist today but time was 24.08 sec.   5.  Patient will report decreased PSFS by 2 points showing functional improvement Baseline: 1.6 (see correction 10/13) Goal status:  MET  6.  Decreased back pain with sitting and standing by  75% or more Baseline:  Goal status: MET  7. Patient will score at least 8 points higher on Berg Balance test to demonstrate lower risk of falls. (MCID= 8 points) .  Baseline: TBD Goal status: NEW  8.  Patient able to complete floor to stand transfer independently demonstrating improved UE strength Baseline: Requires use of chair Goal status: NEW           PLAN:  PT FREQUENCY: 2x/week  PT DURATION: 8 weeks  PLANNED INTERVENTIONS: [id number]- PT Re-evaluation, 97110-Therapeutic exercises, 97530- Therapeutic activity, 97112- Neuromuscular re-education, 97535- Self Care, [id number]- Manual therapy, [phone]- Gait training, [phone]- Aquatic Therapy, [phone]- Electrical stimulation (unattended), 20560 (1-2 muscles), 20561 (3+ muscles)- Dry Needling, Patient/Family education, Balance training, Stair training, Taping, Joint mobilization, Spinal mobilization, DME instructions, Moist heat, and Biofeedback  PLAN FOR NEXT SESSION:Recert to work on UE strength and balance.   Mliss Cummins, PT 12/27/23 2:10 PM

## 2023-12-27 ENCOUNTER — Ambulatory Visit (HOSPITAL_BASED_OUTPATIENT_CLINIC_OR_DEPARTMENT_OTHER): Admitting: Physical Therapy

## 2023-12-27 NOTE — Addendum Note (Signed)
 Addended by: Sheylin Scharnhorst J on: 12/27/2023 02:22 PM   Modules accepted: Orders

## 2023-12-28 ENCOUNTER — Encounter (HOSPITAL_COMMUNITY): Payer: Self-pay | Admitting: Psychiatry

## 2023-12-28 ENCOUNTER — Ambulatory Visit (INDEPENDENT_AMBULATORY_CARE_PROVIDER_SITE_OTHER): Admitting: Psychiatry

## 2023-12-28 VITALS — BP 124/98 | HR 84 | Ht 65.0 in | Wt 202.8 lb

## 2023-12-28 DIAGNOSIS — F5102 Adjustment insomnia: Secondary | ICD-10-CM

## 2023-12-28 DIAGNOSIS — F331 Major depressive disorder, recurrent, moderate: Secondary | ICD-10-CM | POA: Diagnosis not present

## 2023-12-28 DIAGNOSIS — F1021 Alcohol dependence, in remission: Secondary | ICD-10-CM | POA: Diagnosis not present

## 2023-12-28 MED ORDER — BUPROPION HCL ER (SR) 150 MG PO TB12: 150.0000 mg | ORAL_TABLET | Freq: Every day | ORAL | 0 refills | Status: DC

## 2023-12-28 NOTE — Progress Notes (Addendum)
 [facility] Outpatient visit Tele psych  Patient Identification: Tonya Bartlett MRN:  [id number] Date of Evaluation:  12/28/2023 Referral Source: NP Chief Complaint:   Depression follow up  Visit Diagnosis:    ICD-10-CM   1. Major depressive disorder, recurrent episode, moderate (HCC)  F33.1     2. Adjustment insomnia  F51.02            History of Present Illness: 78  years old currently widowed white female lives by herself has 3 grown kids and 6 grandkids referred initially by family medicine for management of depression  Remains sober 40 plus years, attends and chair AA groups  Has had breast surgery daignosed with cancer, recovering On eval doing fair gets lonely, subdued at times when by herself.  When she is with her son and family does ok  Attends AA and likes it  Has decreased motivation at times  Modifying factors : family, daughter aggravating factors. Hip surgery,  breast surgery, medical conditions Severity gets subdued when lonely  Past Psychiatric History: depression  Previous Psychotropic Medications: Yes   Substance Abuse History in the last 12 months:  No.  Consequences of Substance Abuse: NA  Past Medical History:  Past Medical History:  Diagnosis Date   Alcohol abuse    Sober 41 years.   Allergic rhinitis    Allergy    Anemia    Anxiety    Blood transfusion without reported diagnosis    Breast cancer (HCC) 12/2020   left   Cancer (HCC)    Cataract    bil cataracts removed   Clotting disorder    DVT- 1996 po knee replacement   Colitis    Complication of anesthesia    vomit x1 while on Morphine per pt   Depression    Diverticulosis    Dysrhythmia    Atrial fibrillation   Fatigue    Frequency of urination    GERD (gastroesophageal reflux disease)    History of colon polyps    History of DVT of lower extremity    POST LEFT TOTAL KNEE  1996   History of hiatal hernia    History of iron deficiency anemia 1996   iron infusion   History  of migraine headaches    History of radiation therapy    left breast 03/27/2021-04/23/2021 Dr Lynwood Nasuti   History of rib fracture    Hyperlipidemia    Hypertension    IBS (irritable bowel syndrome)    Insomnia    Joint pain    MCI (mild cognitive impairment)    Melanoma (HCC) 2019   left leg    Migraine headache    hx of migraines    OA (osteoarthritis)    RIGHT SHOULDER   Osteopenia    Personal history of radiation therapy    Pneumonia    remote history   PONV (postoperative nausea and vomiting)    Recovering alcoholic (HCC)    SINCE 01-21-1980   Right rotator cuff tear    SOB (shortness of breath) on exertion    Substance abuse (HCC)    recovering alcoholic   Swallowing difficulty    Unspecified essential hypertension     Past Surgical History:  Procedure Laterality Date   ATRIAL FIBRILLATION ABLATION N/A 10/13/2022   Procedure: ATRIAL FIBRILLATION ABLATION;  Surgeon: Cindie Ole DASEN, MD;  Location: [facility];  Service: Cardiovascular;  Laterality: N/A;   BREAST BIOPSY Left 01/06/2021   pos   BREAST LUMPECTOMY  BREAST LUMPECTOMY WITH RADIOACTIVE SEED LOCALIZATION Left 02/27/2021   Procedure: LEFT BREAST LUMPECTOMY WITH RADIOACTIVE SEED LOCALIZATION;  Surgeon: Aron Shoulders, MD;  Location: [facility] OR;  Service: General;  Laterality: Left;   BUNIONECTOMY/  HAMMERTOE CORRECTION  RIGHT FOOT  2011   CATARACT EXTRACTION W/ INTRAOCULAR LENS  IMPLANT, BILATERAL     COLONOSCOPY     DILATION AND CURETTAGE OF UTERUS  1976   EYE SURGERY Bilateral 2016   cataract removal   KNEE ARTHROSCOPY W/ MENISCECTOMY Bilateral X2  LEFT /    X1  RIGHT   KNEE OPEN LATERAL RELEASE Bilateral    MOHS SURGERY Left 12/15/2017   Melanoma in situ - left calf - [facility]   ORIF ANKLE FRACTURE Right 08/04/2020   Procedure: OPEN REDUCTION INTERNAL FIXATION (ORIF) ANKLE FRACTURE;  Surgeon: Kit Rush, MD;  Location: [facility];  Service: Orthopedics;  Laterality: Right;  Mini C-arm,  [employer] Small Frag   REPLACEMENT TOTAL KNEE Left 2006   SHOULDER ARTHROSCOPY WITH SUBACROMIAL DECOMPRESSION, ROTATOR CUFF REPAIR AND BICEP TENDON REPAIR Right 05/23/2013   Procedure: RIGHT SHOULDER ARTHROSCOPY EXAM UNDER ANESTHESIA  WITH SUBACROMIAL DECOMPRESSION,DISTAL CLAVICLE RESECTION, SADLABRAL DEBRIDEMENT CHONDROPLASTY, BICEP TENOTOMY ;  Surgeon: Lamar Collet, MD;  Location: [facility];  Service: Orthopedics;  Laterality: Right;   TONSILLECTOMY AND ADENOIDECTOMY  AGE 17   TOTAL HIP ARTHROPLASTY Left 05/04/2017   Procedure: LEFT TOTAL HIP ARTHROPLASTY ANTERIOR APPROACH;  Surgeon: Ernie Cough, MD;  Location: [facility];  Service: Orthopedics;  Laterality: Left;   TOTAL HIP ARTHROPLASTY Right 08/01/2019   Procedure: TOTAL HIP ARTHROPLASTY ANTERIOR APPROACH;  Surgeon: Ernie Cough, MD;  Location: [facility];  Service: Orthopedics;  Laterality: Right;    TOTAL KNEE ARTHROPLASTY Bilateral LEFT  1996/   RIGHT 2004   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      UPPER GASTROINTESTINAL ENDOSCOPY     UPPER GI ENDOSCOPY     VAGINAL HYSTERECTOMY  1976    Family Psychiatric History: mom possible depression  Family History:  Family History  Problem Relation Age of Onset   Cancer Mother    Heart disease Mother    Hypertension Mother    Melanoma Mother 35   Obesity Mother    Heart disease Father 50   Hypertension Father    Esophageal cancer Brother    Dementia Paternal Grandfather    Melanoma Niece 47   Colon cancer Neg Hx    Colon polyps Neg Hx    Stomach cancer Neg Hx    Rectal cancer Neg Hx     Social History:   Social History   Socioeconomic History   Marital status: Widowed    Spouse name: Not on file   Number of children: 3   Years of education: Not on file   Highest education level: Master's degree (e.g., MA, MS, MEng, MEd, MSW, MBA)  Occupational History   Occupation: retired paramedic  Tobacco Use   Smoking status: Former    Current packs/day: 0.00     Average packs/day: 0.5 packs/day for 15.0 years (7.5 ttl pk-yrs)    Types: Cigarettes    Start date: 05/16/1970    Quit date: 05/15/1985    Years since quitting: 38.6   Smokeless tobacco: Never   Tobacco comments:    Former smoker 12/10/22  Vaping Use   Vaping status: Never Used  Substance and Sexual Activity   Alcohol use: No    Comment: RECOVERING ALCOHOLIC--  QUIT [phone]  Drug use: No   Sexual activity: Not Currently    Birth control/protection: Post-menopausal  Other Topics Concern   Not on file  Social History Narrative   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    Widowed    Three kids    6 grandchildren       Social Drivers of Corporate Investment Banker Strain: Low Risk  (04/18/2021)   Overall Financial Resource Strain (CARDIA)    Difficulty of Paying Living Expenses: Not hard at all  Food Insecurity: No Food Insecurity (04/15/2023)   Hunger Vital Sign    Worried About Running Out of Food in the Last Year: Never true    Ran Out of Food in the Last Year: Never true  Transportation Needs: No Transportation Needs (04/15/2023)   PRAPARE - Administrator, Civil Service (Medical): No    Lack of Transportation (Non-Medical): No  Physical Activity: Inactive (04/18/2021)   Exercise Vital Sign    Days of Exercise per Week: 0 days    Minutes of Exercise per Session: 0 min  Stress: No Stress Concern Present (04/18/2021)   [employer] of Occupational Health - Occupational Stress Questionnaire    Feeling of Stress : Not at all  Social Connections: Moderately Integrated (04/18/2021)   Social Connection and Isolation Panel    Frequency of Communication with Friends and Family: More than three times a week    Frequency of Social Gatherings with Friends and Family: More than three times a week    Attends Religious Services: More than 4 times per year    Active Member of [employer] or Organizations: Yes    Attends Banker  Meetings: More than 4 times per year    Marital Status: Widowed     Allergies:   Allergies  Allergen Reactions   Cuprimine [Penicillamine] Anaphylaxis   Nsaids Anaphylaxis and Other (See Comments)    Severe stomach cramping Mouth sores   Otezla [Apremilast] Anaphylaxis    Suicidal ideation   Penicillins Anaphylaxis   Erythromycin Other (See Comments)    SEVERE STOMACH CRAMPS   Morphine And Codeine Nausea And Vomiting   Remicade  [Infliximab ] Other (See Comments) and Hypertension    Shut down immune system     Skyrizi [Risankizumab] Other (See Comments) and Cough    Upper respiratory infection Urinary tract infection Fever   Tolectin [Tolmetin] Other (See Comments)    Severe stomach cramping   Nickel Rash    Including snaps on hospital gowns     Metabolic Disorder Labs: Lab Results  Component Value Date   HGBA1C 5.3 01/29/2022   No results found for: PROLACTIN Lab Results  Component Value Date   CHOL 147 01/29/2022   TRIG 84 01/29/2022   HDL 61 01/29/2022   CHOLHDL 2.7 06/11/2021   VLDL 86.1 04/24/2021   LDLCALC 70 01/29/2022   LDLCALC 90 06/11/2021     Current Medications: Current Outpatient Medications  Medication Sig Dispense Refill   acetaminophen  (TYLENOL ) 325 MG tablet Take 2 tablets (650 mg total) by mouth every 4 (four) hours as needed for mild pain (pain score 1-3) (or Fever >/= 101).     albuterol  (VENTOLIN  HFA) 108 (90 Base) MCG/ACT inhaler INHALE 2 PUFFS INTO THE LUNGS EVERY 6 (SIX) HOURS AS NEEDED FOR WHEEZING OR SHORTNESS OF BREATH (COUGH ). START WITH USING IN AM, AROUND NOON AND 1 HOUR BEFORE BEDTIME TO HELP COUGH SYMPTOMS. 8.5 each 1  atorvastatin  (LIPITOR) 40 MG tablet TAKE 1 TABLET BY MOUTH EVERY DAY 90 tablet 1   cyclobenzaprine  (FLEXERIL ) 5 MG tablet Take 1 tablet (5 mg total) by mouth 3 (three) times daily as needed for muscle spasms. 30 tablet 0   HYDROcodone -acetaminophen  (NORCO/VICODIN) 5-325 MG tablet Take 2 tablets by mouth every 4  (four) hours as needed for moderate pain (pain score 4-6) or severe pain (pain score 7-10). 30 tablet 0   losartan  (COZAAR ) 100 MG tablet TAKE 1 TABLET BY MOUTH EVERY DAY 90 tablet 3   metoprolol  tartrate (LOPRESSOR ) 25 MG tablet TAKE 1 TABLET BY MOUTH TWICE A DAY 180 tablet 3   omeprazole  (PRILOSEC) 40 MG capsule Take 1 capsule (40 mg total) by mouth 2 (two) times daily. 180 capsule 3   polyethylene glycol (MIRALAX  / GLYCOLAX ) 17 g packet Take 17 g by mouth daily as needed for moderate constipation. 14 each 0   sulfamethoxazole -trimethoprim  (BACTRIM ,SEPTRA ) 400-80 MG tablet Take 1 tablet by mouth at bedtime.     SUMAtriptan  (IMITREX ) 100 MG tablet TAKE 1 TABLET (100 MG TOTAL) BY MOUTH EVERY 2 (TWO) HOURS AS NEEDED FOR MIGRAINE. MAY REPEAT IN 2 HOURS IF HEADACHE PERSISTS OR RECURS. 10 tablet 2   tamoxifen  (NOLVADEX ) 10 MG tablet Take 1/2 tablet by mouth in the evening 90 tablet 1   traZODone  (DESYREL ) 50 MG tablet TAKE 1 TABLET BY MOUTH EVERYDAY AT BEDTIME 90 tablet 1   venlafaxine  XR (EFFEXOR -XR) 150 MG 24 hr capsule Take 1 capsule (150 mg total) by mouth daily. 90 capsule 0   buPROPion  ER (WELLBUTRIN  SR) 150 MG 12 hr tablet Take 1 tablet (150 mg total) by mouth daily. 30 tablet 0   rizatriptan  (MAXALT ) 10 MG tablet Take 1 tablet (10 mg total) by mouth as needed for migraine. May repeat in 2 hours if needed (Patient not taking: Reported on 12/28/2023) 10 tablet 1   senna-docusate (SENOKOT-S) 8.6-50 MG tablet Take 1 tablet by mouth at bedtime as needed for mild constipation. (Patient not taking: Reported on 12/28/2023) 30 tablet 0   umeclidinium-vilanterol (ANORO ELLIPTA ) 62.5-25 MCG/ACT AEPB Inhale 1 puff into the lungs daily. (Patient not taking: Reported on 12/28/2023) 60 each 3   No current facility-administered medications for this visit.     Psychiatric Specialty Exam: Review of Systems  Cardiovascular:  Negative for chest pain.  Musculoskeletal:  Negative for back pain.   Psychiatric/Behavioral:  Positive for depression. Negative for suicidal ideas.     Blood pressure (!) 124/98, pulse 84, height 5' 5 (1.651 m), weight 202 lb 12.8 oz (92 kg).Body mass index is 33.75 kg/m.  General Appearance: casual  Eye Contact:  fair  Speech:  Normal Rate  Volume:  Decreased  Mood: fair , somewhat subdued  Affect: congruent  Thought Process:  Goal Directed  Orientation:  Full (Time, Place, and Person)  Thought Content:  Rumination  Suicidal Thoughts:  No  Homicidal Thoughts:  No  Memory:  Immediate;   Fair Recent;   Fair  Judgement:  Fair  Insight:  Fair  Psychomotor Activity:  Normal  Concentration:  Concentration: Fair and Attention Span: Fair  Recall:  [employer] of Knowledge:Good  Language: Good  Akathisia:  No  Handed:  Right  AIMS (if indicated):    Assets:  Desire for Improvement  ADL's:  Intact  Cognition: Impaired,  Mild  Sleep:  Variable to fair on meds    Treatment Plan Summary: Medication management and Plan as follows  Prior documentation reviewed    1. MDD, mild to moderate:gets subdued when lonely, discussed to add activities, will increase wellbutrin  to 150mg   Continue effexor  for depression and associated stress/anxiety  2. Insomnia: reviewed sleep hygiene, continue trazadone   3. Alcohol dependence:sustained remission, remains sober, continue relapse prevention and attends AA,   Patient denies chest pain, understands to monitor BP and compliance with meds. She is on meds and follow up with PCP if needed Direct care time spent 20 min including face to face  Reviewed meds, questions addressed  Fu 2- 3 m.     Jackey Flight, MD 11/11/20252:02 PM

## 2023-12-29 ENCOUNTER — Ambulatory Visit

## 2024-01-03 ENCOUNTER — Ambulatory Visit: Admitting: Physical Therapy

## 2024-01-03 ENCOUNTER — Telehealth (HOSPITAL_BASED_OUTPATIENT_CLINIC_OR_DEPARTMENT_OTHER): Payer: Self-pay | Admitting: *Deleted

## 2024-01-03 NOTE — Telephone Encounter (Signed)
   Pre-operative Risk Assessment    Patient Name: Tonya Bartlett  DOB: Oct 23, 1945 MRN: [id number]   Date of last office visit: 08/04/23 DR. LAMBERT Date of next office visit: NONE    PREOP FORM STATES 2ND REQUEST THOUGH I DO NOT SEE A REQUEST IN THE CHART   Request for Surgical Clearance    Procedure:  ACDF C4-C5  Date of Surgery:  Clearance TBD                                Surgeon:  DR. VICTORY GENS Surgeon's Group or Practice Name:  [facility] Phone number:  [phone] Fax number:  [phone] ATTN: KATIE   Type of Clearance Requested:   - Medical    Type of Anesthesia:  Not Indicated (GENERAL?)   Additional requests/questions:    Tonya Bartlett   01/03/2024, 5:30 PM

## 2024-01-04 NOTE — Telephone Encounter (Signed)
   Name: Tonya Bartlett  DOB: 11/23/1945  MRN: [id number]  Primary Cardiologist: Dorn Lesches, MD  Chart reviewed as part of pre-operative protocol coverage. Because of [name]'s past medical history and time since last visit, she will require a follow-up in-office visit in order to better assess preoperative cardiovascular risk.  Pre-op  covering staff: - Please schedule appointment and call patient to inform them. If patient already had an upcoming appointment within acceptable timeframe, please add pre-op  clearance to the appointment notes so provider is aware. - Please contact requesting surgeon's office via preferred method (i.e, phone, fax) to inform them of need for appointment prior to surgery.  Needs in office appt as she likely can't complete 4.0 METS.  Jon Garre Geniene List, PA  01/04/2024, 9:12 AM

## 2024-01-04 NOTE — Telephone Encounter (Signed)
 Left message to call back and schedule IN OFFICE PREOP APPT.

## 2024-01-05 ENCOUNTER — Ambulatory Visit: Admitting: Physical Therapy

## 2024-01-05 DIAGNOSIS — R82998 Other abnormal findings in urine: Secondary | ICD-10-CM | POA: Diagnosis not present

## 2024-01-05 DIAGNOSIS — N3020 Other chronic cystitis without hematuria: Secondary | ICD-10-CM | POA: Diagnosis not present

## 2024-01-05 DIAGNOSIS — R1024 Suprapubic pain: Secondary | ICD-10-CM | POA: Diagnosis not present

## 2024-01-05 NOTE — Telephone Encounter (Signed)
 Spoke to patient and she informed me she is seeing Dr. Court for her regular 12 month f/u on 01/06/24 and spoke to his nurse and got permission to add pre-op  clearance to the appointment note.

## 2024-01-06 ENCOUNTER — Ambulatory Visit: Attending: Cardiovascular Disease | Admitting: Cardiovascular Disease

## 2024-01-06 DIAGNOSIS — M25531 Pain in right wrist: Secondary | ICD-10-CM | POA: Diagnosis not present

## 2024-01-10 ENCOUNTER — Ambulatory Visit: Admitting: Physical Therapy

## 2024-01-10 ENCOUNTER — Inpatient Hospital Stay (HOSPITAL_COMMUNITY)
Admission: EM | Admit: 2024-01-10 | Discharge: 2024-01-14 | DRG: 683 | Disposition: A | Discharge status: Skilled Nursing Facility | Attending: Internal Medicine | Admitting: Internal Medicine

## 2024-01-10 ENCOUNTER — Encounter (HOSPITAL_COMMUNITY): Payer: Self-pay

## 2024-01-10 ENCOUNTER — Emergency Department (HOSPITAL_COMMUNITY)

## 2024-01-10 DIAGNOSIS — E785 Hyperlipidemia, unspecified: Secondary | ICD-10-CM | POA: Diagnosis present

## 2024-01-10 DIAGNOSIS — S32019A Unspecified fracture of first lumbar vertebra, initial encounter for closed fracture: Secondary | ICD-10-CM | POA: Diagnosis not present

## 2024-01-10 DIAGNOSIS — I160 Hypertensive urgency: Secondary | ICD-10-CM | POA: Diagnosis present

## 2024-01-10 DIAGNOSIS — Z8379 Family history of other diseases of the digestive system: Secondary | ICD-10-CM

## 2024-01-10 DIAGNOSIS — Z880 Allergy status to penicillin: Secondary | ICD-10-CM

## 2024-01-10 DIAGNOSIS — S3992XA Unspecified injury of lower back, initial encounter: Secondary | ICD-10-CM | POA: Diagnosis not present

## 2024-01-10 DIAGNOSIS — E876 Hypokalemia: Secondary | ICD-10-CM | POA: Diagnosis present

## 2024-01-10 DIAGNOSIS — Z96643 Presence of artificial hip joint, bilateral: Secondary | ICD-10-CM | POA: Diagnosis present

## 2024-01-10 DIAGNOSIS — Z800 Family history of malignant neoplasm of digestive organs: Secondary | ICD-10-CM

## 2024-01-10 DIAGNOSIS — Z881 Allergy status to other antibiotic agents status: Secondary | ICD-10-CM

## 2024-01-10 DIAGNOSIS — S32000A Wedge compression fracture of unspecified lumbar vertebra, initial encounter for closed fracture: Secondary | ICD-10-CM

## 2024-01-10 DIAGNOSIS — M8580 Other specified disorders of bone density and structure, unspecified site: Secondary | ICD-10-CM | POA: Diagnosis not present

## 2024-01-10 DIAGNOSIS — R296 Repeated falls: Secondary | ICD-10-CM | POA: Diagnosis present

## 2024-01-10 DIAGNOSIS — G894 Chronic pain syndrome: Secondary | ICD-10-CM | POA: Diagnosis present

## 2024-01-10 DIAGNOSIS — R9089 Other abnormal findings on diagnostic imaging of central nervous system: Secondary | ICD-10-CM | POA: Diagnosis not present

## 2024-01-10 DIAGNOSIS — F411 Generalized anxiety disorder: Secondary | ICD-10-CM | POA: Diagnosis present

## 2024-01-10 DIAGNOSIS — M4316 Spondylolisthesis, lumbar region: Secondary | ICD-10-CM | POA: Diagnosis not present

## 2024-01-10 DIAGNOSIS — Z79899 Other long term (current) drug therapy: Secondary | ICD-10-CM

## 2024-01-10 DIAGNOSIS — Z90710 Acquired absence of both cervix and uterus: Secondary | ICD-10-CM

## 2024-01-10 DIAGNOSIS — Z8249 Family history of ischemic heart disease and other diseases of the circulatory system: Secondary | ICD-10-CM

## 2024-01-10 DIAGNOSIS — M4807 Spinal stenosis, lumbosacral region: Secondary | ICD-10-CM | POA: Diagnosis not present

## 2024-01-10 DIAGNOSIS — Z96653 Presence of artificial knee joint, bilateral: Secondary | ICD-10-CM | POA: Diagnosis present

## 2024-01-10 DIAGNOSIS — S32010A Wedge compression fracture of first lumbar vertebra, initial encounter for closed fracture: Secondary | ICD-10-CM | POA: Diagnosis not present

## 2024-01-10 DIAGNOSIS — M5416 Radiculopathy, lumbar region: Secondary | ICD-10-CM | POA: Diagnosis present

## 2024-01-10 DIAGNOSIS — M4856XA Collapsed vertebra, not elsewhere classified, lumbar region, initial encounter for fracture: Secondary | ICD-10-CM | POA: Diagnosis present

## 2024-01-10 DIAGNOSIS — R42 Dizziness and giddiness: Secondary | ICD-10-CM

## 2024-01-10 DIAGNOSIS — H8111 Benign paroxysmal vertigo, right ear: Secondary | ICD-10-CM | POA: Diagnosis present

## 2024-01-10 DIAGNOSIS — Z9842 Cataract extraction status, left eye: Secondary | ICD-10-CM

## 2024-01-10 DIAGNOSIS — F1021 Alcohol dependence, in remission: Secondary | ICD-10-CM | POA: Diagnosis present

## 2024-01-10 DIAGNOSIS — I119 Hypertensive heart disease without heart failure: Secondary | ICD-10-CM | POA: Diagnosis present

## 2024-01-10 DIAGNOSIS — M4726 Other spondylosis with radiculopathy, lumbar region: Secondary | ICD-10-CM | POA: Diagnosis not present

## 2024-01-10 DIAGNOSIS — Z9841 Cataract extraction status, right eye: Secondary | ICD-10-CM

## 2024-01-10 DIAGNOSIS — R7989 Other specified abnormal findings of blood chemistry: Secondary | ICD-10-CM | POA: Diagnosis present

## 2024-01-10 DIAGNOSIS — G4700 Insomnia, unspecified: Secondary | ICD-10-CM | POA: Diagnosis present

## 2024-01-10 DIAGNOSIS — Z923 Personal history of irradiation: Secondary | ICD-10-CM

## 2024-01-10 DIAGNOSIS — I10 Essential (primary) hypertension: Secondary | ICD-10-CM | POA: Diagnosis present

## 2024-01-10 DIAGNOSIS — Z85820 Personal history of malignant melanoma of skin: Secondary | ICD-10-CM

## 2024-01-10 DIAGNOSIS — N179 Acute kidney failure, unspecified: Principal | ICD-10-CM | POA: Diagnosis present

## 2024-01-10 DIAGNOSIS — Z888 Allergy status to other drugs, medicaments and biological substances status: Secondary | ICD-10-CM

## 2024-01-10 DIAGNOSIS — Z885 Allergy status to narcotic agent status: Secondary | ICD-10-CM

## 2024-01-10 DIAGNOSIS — K219 Gastro-esophageal reflux disease without esophagitis: Secondary | ICD-10-CM | POA: Diagnosis present

## 2024-01-10 DIAGNOSIS — Z87891 Personal history of nicotine dependence: Secondary | ICD-10-CM

## 2024-01-10 DIAGNOSIS — Z86006 Personal history of melanoma in-situ: Secondary | ICD-10-CM

## 2024-01-10 DIAGNOSIS — Z86718 Personal history of other venous thrombosis and embolism: Secondary | ICD-10-CM

## 2024-01-10 DIAGNOSIS — I6782 Cerebral ischemia: Secondary | ICD-10-CM | POA: Diagnosis not present

## 2024-01-10 DIAGNOSIS — W19XXXA Unspecified fall, initial encounter: Secondary | ICD-10-CM | POA: Diagnosis present

## 2024-01-10 DIAGNOSIS — Z853 Personal history of malignant neoplasm of breast: Secondary | ICD-10-CM

## 2024-01-10 DIAGNOSIS — M48061 Spinal stenosis, lumbar region without neurogenic claudication: Secondary | ICD-10-CM | POA: Diagnosis not present

## 2024-01-10 DIAGNOSIS — R4182 Altered mental status, unspecified: Secondary | ICD-10-CM | POA: Diagnosis not present

## 2024-01-10 DIAGNOSIS — Z860100 Personal history of colon polyps, unspecified: Secondary | ICD-10-CM

## 2024-01-10 DIAGNOSIS — N390 Urinary tract infection, site not specified: Secondary | ICD-10-CM | POA: Diagnosis present

## 2024-01-10 DIAGNOSIS — Z961 Presence of intraocular lens: Secondary | ICD-10-CM | POA: Diagnosis present

## 2024-01-10 DIAGNOSIS — Z808 Family history of malignant neoplasm of other organs or systems: Secondary | ICD-10-CM

## 2024-01-10 LAB — CBC
HCT: 34.7 % — ABNORMAL LOW (ref 36.0–46.0)
Hemoglobin: 11.6 g/dL — ABNORMAL LOW (ref 12.0–15.0)
MCH: 33.3 pg (ref 26.0–34.0)
MCHC: 33.4 g/dL (ref 30.0–36.0)
MCV: 99.7 fL (ref 80.0–100.0)
Platelets: 334 K/uL (ref 150–400)
RBC: 3.48 MIL/uL — ABNORMAL LOW (ref 3.87–5.11)
RDW: 12.6 % (ref 11.5–15.5)
WBC: 7.1 K/uL (ref 4.0–10.5)
nRBC: 0 % (ref 0.0–0.2)

## 2024-01-10 LAB — URINALYSIS, ROUTINE W REFLEX MICROSCOPIC
Bilirubin Urine: NEGATIVE
Glucose, UA: NEGATIVE mg/dL
Hgb urine dipstick: NEGATIVE
Ketones, ur: NEGATIVE mg/dL
Nitrite: NEGATIVE
Protein, ur: NEGATIVE mg/dL
Specific Gravity, Urine: 1.012 (ref 1.005–1.030)
pH: 6 (ref 5.0–8.0)

## 2024-01-10 LAB — COMPREHENSIVE METABOLIC PANEL WITH GFR
ALT: 27 U/L (ref 0–44)
AST: 36 U/L (ref 15–41)
Albumin: 3.2 g/dL — ABNORMAL LOW (ref 3.5–5.0)
Alkaline Phosphatase: 72 U/L (ref 38–126)
Anion gap: 12 (ref 5–15)
BUN: 24 mg/dL — ABNORMAL HIGH (ref 8–23)
CO2: 21 mmol/L — ABNORMAL LOW (ref 22–32)
Calcium: 8.9 mg/dL (ref 8.9–10.3)
Chloride: 104 mmol/L (ref 98–111)
Creatinine, Ser: 1.21 mg/dL — ABNORMAL HIGH (ref 0.44–1.00)
GFR, Estimated: 46 mL/min — ABNORMAL LOW (ref >60)
Glucose, Bld: 95 mg/dL (ref 70–99)
Potassium: 3.6 mmol/L (ref 3.5–5.1)
Sodium: 137 mmol/L (ref 135–145)
Total Bilirubin: 0.8 mg/dL (ref 0.0–1.2)
Total Protein: 6.9 g/dL (ref 6.5–8.1)

## 2024-01-10 LAB — AMMONIA: Ammonia: <13 umol/L (ref 9–35)

## 2024-01-10 MED ORDER — GADOBUTROL 1 MMOL/ML IV SOLN
9.0000 mL | Freq: Once | INTRAVENOUS | Status: AC | PRN
  Administered 2024-01-10: 9 mL via INTRAVENOUS

## 2024-01-10 MED ORDER — LACTATED RINGERS IV BOLUS
1000.0000 mL | Freq: Once | INTRAVENOUS | Status: AC
  Administered 2024-01-10: 1000 mL via INTRAVENOUS

## 2024-01-10 NOTE — ED Provider Notes (Signed)
 [place] EMERGENCY DEPARTMENT AT [facility] Provider Note   CSN: [id number] Arrival date & time: 01/10/24  1139     Patient presents with: Fall   Tonya Bartlett Yeske is a 78 y.o. female.   78 year old female with past medical history of hypertension and migraine headaches with frequent falls since February presenting to the emergency department today with frequent falls.  The patient has had multiple falls over the past week.  This has been getting worse over the past few days.  The patient has hit her head multiple times when falling.  She denies any recent head trauma today.  She reports that she is having some disequilibrium which is causing the falls as well as some weakness in her lower extremity on the left.  The patient does have known lumbar radiculopathy and has seen neurosurgery in the past for this and this seems to be a little worse as well.  The patient was unable to urinate today which is abnormal for her.  She states that her back pain is chronic but a little worse since she has fallen multiple times over the past week.  She denies any saddle anesthesia.   Fall       Prior to Admission medications   Medication Sig Start Date End Date Taking? Authorizing Provider  acetaminophen  (TYLENOL ) 325 MG tablet Take 2 tablets (650 mg total) by mouth every 4 (four) hours as needed for mild pain (pain score 1-3) (or Fever >/= 101). 04/20/23  Yes Rosario Leatrice FERNS, MD  albuterol  (VENTOLIN  HFA) 108 (90 Base) MCG/ACT inhaler INHALE 2 PUFFS INTO THE LUNGS EVERY 6 (SIX) HOURS AS NEEDED FOR WHEEZING OR SHORTNESS OF BREATH (COUGH ). START WITH USING IN AM, AROUND NOON AND 1 HOUR BEFORE BEDTIME TO HELP COUGH SYMPTOMS. 04/20/23  Yes Nafziger, Darleene, NP  atorvastatin  (LIPITOR) 40 MG tablet TAKE 1 TABLET BY MOUTH EVERY DAY 11/08/23  Yes Court Dorn PARAS, MD  buPROPion  ER (WELLBUTRIN  SR) 150 MG 12 hr tablet Take 1 tablet (150 mg total) by mouth daily. 12/28/23  Yes Geralene Kaiser, MD   cetirizine (ZYRTEC) 10 MG tablet Take 10 mg by mouth daily.   Yes [provider]  cyclobenzaprine  (FLEXERIL ) 5 MG tablet Take 1 tablet (5 mg total) by mouth 3 (three) times daily as needed for muscle spasms. 04/20/23  Yes Rosario Leatrice FERNS, MD  HYDROcodone -acetaminophen  (NORCO/VICODIN) 5-325 MG tablet Take 2 tablets by mouth every 4 (four) hours as needed for moderate pain (pain score 4-6) or severe pain (pain score 7-10). Patient taking differently: Take 1 tablet by mouth every 4 (four) hours as needed for moderate pain (pain score 4-6) or severe pain (pain score 7-10). 04/20/23  Yes Rosario Leatrice FERNS, MD  losartan  (COZAAR ) 100 MG tablet TAKE 1 TABLET BY MOUTH EVERY DAY 05/11/23  Yes Nafziger, Cory, NP  metoprolol  tartrate (LOPRESSOR ) 25 MG tablet TAKE 1 TABLET BY MOUTH TWICE A DAY 07/20/23  Yes Leverne Charlies Helling, PA-C  omeprazole  (PRILOSEC) 40 MG capsule Take 1 capsule (40 mg total) by mouth 2 (two) times daily. 09/28/23  Yes Nafziger, Darleene, NP  sulfamethoxazole -trimethoprim  (BACTRIM ,SEPTRA ) 400-80 MG tablet Take 1 tablet by mouth at bedtime.   Yes Ottelin, Mark, MD  SUMAtriptan  (IMITREX ) 100 MG tablet TAKE 1 TABLET (100 MG TOTAL) BY MOUTH EVERY 2 (TWO) HOURS AS NEEDED FOR MIGRAINE. MAY REPEAT IN 2 HOURS IF HEADACHE PERSISTS OR RECURS. 11/24/23  Yes Nafziger, Darleene, NP  traZODone  (DESYREL ) 50 MG tablet TAKE 1  TABLET BY MOUTH EVERYDAY AT BEDTIME 08/05/23  Yes Nafziger, Darleene, NP  venlafaxine  XR (EFFEXOR -XR) 150 MG 24 hr capsule Take 1 capsule (150 mg total) by mouth daily. 07/01/23  Yes Geralene Kaiser, MD    Allergies: Cuprimine [penicillamine], Nsaids, Otezla [apremilast], Penicillins, Erythromycin, Morphine and codeine, Remicade  [infliximab ], Skyrizi [risankizumab], Tolectin [tolmetin], and Nickel    Review of Systems  Musculoskeletal:  Positive for back pain.  Neurological:  Positive for dizziness.  All other systems reviewed and are negative.   Updated Vital Signs BP (!) 154/69   Pulse  93   Temp 98.3 F (36.8 C) (Oral)   Resp 20   Ht 5' 5 (1.651 m)   Wt 91.6 kg   SpO2 100%   BMI 33.61 kg/m   Physical Exam Vitals and nursing note reviewed.   Gen: NAD Eyes: PERRL, EOMI HEENT: no oropharyngeal swelling Neck: trachea midline Resp: clear to auscultation bilaterally Card: RRR, no murmurs, rubs, or gallops Abd: nontender, nondistended Extremities: no calf tenderness, no edema Vascular: 2+ radial pulses bilaterally, 2+ DP pulses bilaterally Neuro: Cranial nerves intact, equal strength and sensation throughout the bilateral upper and lower extremities with exception of mildly diminished strength to the left lower extremity compared to the right, no dysmetria on finger-to-nose testing Skin: no rashes Psyc: acting appropriately   (all labs ordered are listed, but only abnormal results are displayed) Labs Reviewed  COMPREHENSIVE METABOLIC PANEL WITH GFR - Abnormal; Notable for the following components:      Result Value   CO2 21 (*)    BUN 24 (*)    Creatinine, Ser 1.21 (*)    Albumin 3.2 (*)    GFR, Estimated 46 (*)    All other components within normal limits  CBC - Abnormal; Notable for the following components:   RBC 3.48 (*)    Hemoglobin 11.6 (*)    HCT 34.7 (*)    All other components within normal limits  URINALYSIS, ROUTINE W REFLEX MICROSCOPIC - Abnormal; Notable for the following components:   Leukocytes,Ua MODERATE (*)    Bacteria, UA FEW (*)    All other components within normal limits  AMMONIA    EKG: None  Radiology: MR Brain W and Wo Contrast Result Date: 01/10/2024 EXAM: MRI BRAIN WITH AND WITHOUT CONTRAST 01/10/2024 06:37:00 PM TECHNIQUE: Multiplanar multisequence MRI of the head/brain was performed with and without the administration of 9 mL (gadobutrol  (GADAVIST ) 1 MMOL/ML injection 9 mL GADOBUTROL  1 MMOL/ML IV SOLN) intravenous contrast. COMPARISON: CT head 09/22/2023 and MRI head 09/20/2023. CLINICAL HISTORY: Mental status change,  unknown cause. FINDINGS: BRAIN AND VENTRICLES: No acute infarct. No acute intracranial hemorrhage. No mass effect or midline shift. No hydrocephalus. The sella is unremarkable. Normal flow voids. No mass or abnormal enhancement. T2 and FLAIR hyperintensity in the periventricular and subcortical white matter similar to prior and suggestive of mild chronic microvascular ischemic changes. Similar related signal abnormality in the pons. Mild generalized parenchymal volume loss. Multiple sequences are degraded by motion artifact as well as artifact from dental hardware. ORBITS: Bilateral lens replacement. No acute abnormality. SINUSES: No acute abnormality. BONES AND SOFT TISSUES: Normal bone marrow signal and enhancement. No acute soft tissue abnormality. IMPRESSION: 1. No acute intracranial abnormality. 2. Mild chronic microvascular ischemic changes and mild generalized parenchymal volume loss. Electronically signed by: Donnice Mania MD 01/10/2024 07:51 PM EST RP Workstation: [id number]   MR LUMBAR SPINE WO CONTRAST Result Date: 01/10/2024 CLINICAL DATA:  Lumbar radiculopathy, trauma. EXAM: MRI LUMBAR SPINE  WITHOUT CONTRAST TECHNIQUE: Multiplanar, multisequence MR imaging of the lumbar spine was performed. No intravenous contrast was administered. COMPARISON:  MRI lumbar spine 11/12/2023. FINDINGS: Segmentation: The lowest well-formed disc space is labeled L5-S1. This disc space demonstrates moderate disc height loss related to degenerative changes, similar to prior. There is a rudimentary disc space at the S1-2 level. Numbering is concordant with the prior studies. Alignment: Lumbar lordosis is maintained. Similar grade 1 anterolisthesis of L4 on L5. Vertebrae: Redemonstrated compression fracture of L1 with proximally 80% height loss centrally overall similar to prior approximately 4-5 mm retropulsion at the inferior endplate of L1 is also similar to prior. Additional compression fracture of L2 which demonstrates  up to 60% height loss, slighlty increased along the anterior aspect compared to prior. Schmorl's nodes at multiple levels. Vertebral body heights are otherwise maintained. Persistent edema within the L1 vertebral body which may be related to micro motion at the fracture site. There is slightly increased edema within the anterior aspect of the L2 vertebral body with slightly increased height loss anteriorly. No evidence of additional acute fracture. Conus medullaris and cauda equina: Conus extends to the L2 level. Conus and cauda equina appear normal. Paraspinal and other soft tissues: Mild paraspinal muscular atrophy. Similar appearance of bilateral renal cysts. Disc levels: T12-L1: Small disc bulge indents the ventral thecal sac without evidence of significant spinal canal stenosis on sagittal images. Bilateral facet arthrosis. Mild foraminal stenosis on the left. L1-2: Retropulsion of fracture fragments at L1 along with disc bulge which indents the ventral thecal sac. Bilateral facet arthrosis indents the dorsal thecal sac. There is moderate to severe spinal canal stenosis which appears similar to prior. Similar compression of the conus medullaris and crowding of the cauda equina nerve roots. There is mild bilateral foraminal stenosis with partial contribution from retropulsion of fracture fragments overall similar to prior. L2-3: Small disc bulge moderate facet arthrosis. Similar mild spinal canal stenosis. There is mild bilateral neural foraminal stenosis. L3-4: Small disc bulge. Moderate bilateral facet arthrosis and thickening of the ligamentum flavum. No significant spinal canal stenosis. No significant foraminal stenosis. L4-5: Partial uncovering of the disc. Small disc bulge. Mild lateral recess narrowing. Moderate to severe facet arthrosis. Mild thickening of the ligamentum flavum. There is no significant spinal canal stenosis. No significant foraminal stenosis. L5-S1: Moderate disc height loss. Small  right paracentral disc protrusion. Moderate bilateral facet arthrosis. Thickening of the ligamentum flavum. No significant spinal canal stenosis. There is mild foraminal stenosis on the left. IMPRESSION: Redemonstrated compression fractures of L1 and L2. Persistent edema at the L1 level may be related to micro-motion at the fracture site. Additionally there is increased edema anteriorly within the L2 vertebral body with increased height loss of L2 anteriorly. Degenerative changes as above overall similar to prior. Similar moderate to severe spinal canal stenosis at L1-2 secondary to retropulsion of fracture fragments and disc bulge. Similar mild compression of the conus medullaris. Electronically Signed   By: Donnice Mania M.D.   On: 01/10/2024 19:12   DG Lumbar Spine Complete Result Date: 01/10/2024 CLINICAL DATA:  Fall EXAM: LUMBAR SPINE - COMPLETE 4+ VIEW COMPARISON:  MRI lumbar spine 926 2025 FINDINGS: The bones are diffusely osteopenic. Compression deformities of L1 and L2 appear similar to the prior study. There is retropulsion of fracture fragments at L1, unchanged. No new fractures are seen. At L4-L5 there is 2 mm of anterolisthesis which is stable. There is severe disc space narrowing at L5-S1. Bilateral hip arthroplasties are present.  IMPRESSION: 1. No acute fracture. 2. Stable compression deformities of L1 and L2. 3. Stable grade 1 anterolisthesis at L4-L5. Electronically Signed   By: Greig Pique M.D.   On: 01/10/2024 14:58     Procedures   Medications Ordered in the ED  gadobutrol  (GADAVIST ) 1 MMOL/ML injection 9 mL (9 mLs Intravenous Contrast Given 01/10/24 1826)  lactated ringers  bolus 1,000 mL (1,000 mLs Intravenous New Bag/Given 01/10/24 2232)    Clinical Course as of 01/10/24 2342  Mon Jan 10, 2024  1310 MR Brain W and Wo Contrast [ML]    Clinical Course User Index [ML] Willma Duwaine CROME, [place]                                 Medical Decision Making 78 year old female with past  medical history of hypertension and lumbar radiculopathy presenting to the emergency department today with frequent falls over the the preceding months which seems to be acutely worse over the past week or 2 this seems to be due to worsening disequilibrium as well as with the left lower extremity weakness which she has had since February which does seem to be potentially worsening.  Will obtain basic labs here.  She did have an MRI of her brain ordered at triage which I do agree with to evaluate for CVA given her age and with the worsening disequilibrium.  Will also obtain an MRI of her lumbar spine to eval for cauda equina syndrome since the patient is having some urinary retention here.  Will BladderScan the patient here to further evaluate.  I will reevaluate for ultimate disposition.  The patient's labs are revealing for an AKI here which I think are probably exacerbating her symptoms.  Her GFR is less than 45 when she is normally greater than 60.  The patient is noted to have the chronic lumbar spinal fractures as well as some mild edema surrounding them.  This does appear relatively stable but the edema is persistent.  Given these findings plan is for admission for IV fluids and physical therapy consult given her multiple falls.  Amount and/or Complexity of Data Reviewed Labs: ordered. Radiology: ordered.  Risk Decision regarding hospitalization.        Final diagnoses:  AKI (acute kidney injury)  Compression fracture of lumbar vertebra, closed, initial encounter [facility])    ED Discharge Orders     None          Ula Prentice SAUNDERS, MD 01/10/24 2342

## 2024-01-10 NOTE — ED Notes (Signed)
 Helped ambulate pt to the restroom, and urination was unsuccessful.

## 2024-01-10 NOTE — ED Triage Notes (Addendum)
 Pt presents d/t multiple falls.  Pt had a significant fall in February and broke her back.  Pt recently broke R wrist d/t a fall.  Pt lost 2 days after a recent fall.  Denies blood thinners.   Pt's daughter report lack of balance and cognitive decline.  Pt was in rehab after the fall in February.

## 2024-01-10 NOTE — ED Provider Triage Note (Signed)
 Emergency Medicine Provider Triage Evaluation Note  Tonya Bartlett , a 78 y.o. female  was evaluated in triage.  Pt complains of fall.  Patient's daughter who is present for visit states that patient has been having increased falls over the last 8 months.  This week patient has fallen a couple times and on Wednesday hit her head, last night patient fell onto her back.  Patient denies injury.  Patient denies LOC.  Patient denies anticoagulant therapy.  Patient is fully alert and oriented at this time, however patient's daughter states that she has been more confused over the last few weeks.   Review of Systems  Positive: Weakness, confusion, repeat falls Negative: Fevers  Physical Exam  BP (!) 140/83 (BP Location: Left Arm)   Pulse 79   Temp 97.8 F (36.6 C)   Resp 17   Ht 5' 5 (1.651 m)   Wt 91.6 kg   SpO2 100%   BMI 33.61 kg/m  Gen:   Awake, no distress   Resp:  Normal effort  MSK:   Moves extremities without difficulty  Other:  Neuroexam unremarkable  Medical Decision Making  Medically screening exam initiated at 1:05 PM.  Appropriate orders placed.  Tonya Bartlett was informed that the remainder of the evaluation will be completed by another provider, this initial triage assessment does not replace that evaluation, and the importance of remaining in the ED until their evaluation is complete.    Tonya Bartlett, [place] 01/10/24 1308

## 2024-01-10 NOTE — ED Triage Notes (Signed)
 Pt here accompanied by family, reports she has fallen multiple times in the last week. Last fall was last night at 0230 trying to go to the bathroom. Recently seen Wednesday after a fall, broke her right wrist. Pt lives alone.

## 2024-01-10 NOTE — ED Notes (Signed)
 Pt attempted to give urine sample but was not successful. Pt endorses feelings of concern that she is unable to pee as she has not peed since this morning

## 2024-01-10 NOTE — ED Notes (Signed)
 Pt to MRI via transport.

## 2024-01-11 ENCOUNTER — Encounter (HOSPITAL_COMMUNITY): Payer: Self-pay | Admitting: Internal Medicine

## 2024-01-11 ENCOUNTER — Other Ambulatory Visit: Payer: Self-pay

## 2024-01-11 DIAGNOSIS — S32020D Wedge compression fracture of second lumbar vertebra, subsequent encounter for fracture with routine healing: Secondary | ICD-10-CM | POA: Diagnosis not present

## 2024-01-11 DIAGNOSIS — H8111 Benign paroxysmal vertigo, right ear: Secondary | ICD-10-CM | POA: Diagnosis present

## 2024-01-11 DIAGNOSIS — S32010D Wedge compression fracture of first lumbar vertebra, subsequent encounter for fracture with routine healing: Secondary | ICD-10-CM | POA: Diagnosis not present

## 2024-01-11 DIAGNOSIS — I10 Essential (primary) hypertension: Secondary | ICD-10-CM | POA: Diagnosis not present

## 2024-01-11 DIAGNOSIS — E7849 Other hyperlipidemia: Secondary | ICD-10-CM

## 2024-01-11 DIAGNOSIS — M5416 Radiculopathy, lumbar region: Secondary | ICD-10-CM | POA: Diagnosis present

## 2024-01-11 DIAGNOSIS — R42 Dizziness and giddiness: Secondary | ICD-10-CM

## 2024-01-11 DIAGNOSIS — F411 Generalized anxiety disorder: Secondary | ICD-10-CM

## 2024-01-11 DIAGNOSIS — M6281 Muscle weakness (generalized): Secondary | ICD-10-CM | POA: Diagnosis not present

## 2024-01-11 DIAGNOSIS — N179 Acute kidney failure, unspecified: Secondary | ICD-10-CM | POA: Diagnosis present

## 2024-01-11 DIAGNOSIS — R2681 Unsteadiness on feet: Secondary | ICD-10-CM | POA: Diagnosis not present

## 2024-01-11 DIAGNOSIS — G894 Chronic pain syndrome: Secondary | ICD-10-CM | POA: Diagnosis present

## 2024-01-11 DIAGNOSIS — K219 Gastro-esophageal reflux disease without esophagitis: Secondary | ICD-10-CM | POA: Diagnosis not present

## 2024-01-11 DIAGNOSIS — R296 Repeated falls: Secondary | ICD-10-CM

## 2024-01-11 DIAGNOSIS — S6990XD Unspecified injury of unspecified wrist, hand and finger(s), subsequent encounter: Secondary | ICD-10-CM | POA: Diagnosis not present

## 2024-01-11 DIAGNOSIS — Z8379 Family history of other diseases of the digestive system: Secondary | ICD-10-CM

## 2024-01-11 DIAGNOSIS — R2689 Other abnormalities of gait and mobility: Secondary | ICD-10-CM | POA: Diagnosis not present

## 2024-01-11 DIAGNOSIS — E785 Hyperlipidemia, unspecified: Secondary | ICD-10-CM | POA: Diagnosis present

## 2024-01-11 LAB — COMPREHENSIVE METABOLIC PANEL WITH GFR
ALT: 19 U/L (ref 0–44)
AST: 26 U/L (ref 15–41)
Albumin: 2.8 g/dL — ABNORMAL LOW (ref 3.5–5.0)
Alkaline Phosphatase: 62 U/L (ref 38–126)
Anion gap: 12 (ref 5–15)
BUN: 17 mg/dL (ref 8–23)
CO2: 22 mmol/L (ref 22–32)
Calcium: 8.5 mg/dL — ABNORMAL LOW (ref 8.9–10.3)
Chloride: 106 mmol/L (ref 98–111)
Creatinine, Ser: 1.02 mg/dL — ABNORMAL HIGH (ref 0.44–1.00)
GFR, Estimated: 56 mL/min — ABNORMAL LOW (ref >60)
Glucose, Bld: 160 mg/dL — ABNORMAL HIGH (ref 70–99)
Potassium: 3.2 mmol/L — ABNORMAL LOW (ref 3.5–5.1)
Sodium: 140 mmol/L (ref 135–145)
Total Bilirubin: 0.6 mg/dL (ref 0.0–1.2)
Total Protein: 5.7 g/dL — ABNORMAL LOW (ref 6.5–8.1)

## 2024-01-11 LAB — SODIUM, URINE, RANDOM: Sodium, Ur: <30 mmol/L

## 2024-01-11 LAB — CK: Total CK: 98 U/L (ref 38–234)

## 2024-01-11 LAB — CREATININE, URINE, RANDOM: Creatinine, Urine: 69 mg/dL

## 2024-01-11 LAB — CBC
HCT: 29.7 % — ABNORMAL LOW (ref 36.0–46.0)
Hemoglobin: 9.8 g/dL — ABNORMAL LOW (ref 12.0–15.0)
MCH: 32.9 pg (ref 26.0–34.0)
MCHC: 33 g/dL (ref 30.0–36.0)
MCV: 99.7 fL (ref 80.0–100.0)
Platelets: 284 K/uL (ref 150–400)
RBC: 2.98 MIL/uL — ABNORMAL LOW (ref 3.87–5.11)
RDW: 12.9 % (ref 11.5–15.5)
WBC: 5.3 K/uL (ref 4.0–10.5)
nRBC: 0 % (ref 0.0–0.2)

## 2024-01-11 LAB — MAGNESIUM: Magnesium: 2.1 mg/dL (ref 1.7–2.4)

## 2024-01-11 MED ORDER — HYDRALAZINE HCL 20 MG/ML IJ SOLN
10.0000 mg | Freq: Four times a day (QID) | INTRAMUSCULAR | Status: DC | PRN
Start: 2024-01-11 — End: 2024-01-11

## 2024-01-11 MED ORDER — BUPROPION HCL ER (SR) 150 MG PO TB12
150.0000 mg | ORAL_TABLET | Freq: Every day | ORAL | Status: DC
  Administered 2024-01-11 – 2024-01-14 (×4): 150 mg via ORAL
  Filled 2024-01-11 (×4): qty 1

## 2024-01-11 MED ORDER — MECLIZINE HCL 12.5 MG PO TABS
12.5000 mg | ORAL_TABLET | Freq: Three times a day (TID) | ORAL | Status: DC
  Administered 2024-01-11 – 2024-01-14 (×10): 12.5 mg via ORAL
  Filled 2024-01-11 (×10): qty 1

## 2024-01-11 MED ORDER — POTASSIUM CHLORIDE CRYS ER 20 MEQ PO TBCR
40.0000 meq | EXTENDED_RELEASE_TABLET | Freq: Once | ORAL | Status: AC
  Administered 2024-01-11: 40 meq via ORAL
  Filled 2024-01-11: qty 2

## 2024-01-11 MED ORDER — LOSARTAN POTASSIUM 50 MG PO TABS: 100.0000 mg | ORAL_TABLET | Freq: Every day | ORAL | Status: DC

## 2024-01-11 MED ORDER — TRAZODONE HCL 50 MG PO TABS
50.0000 mg | ORAL_TABLET | Freq: Every day | ORAL | Status: DC
  Administered 2024-01-11: 50 mg via ORAL
  Filled 2024-01-11: qty 1

## 2024-01-11 MED ORDER — HYDROCODONE-ACETAMINOPHEN 5-325 MG PO TABS: 1.0000 | ORAL_TABLET | ORAL | Status: DC | PRN

## 2024-01-11 MED ORDER — ACETAMINOPHEN 325 MG PO TABS
650.0000 mg | ORAL_TABLET | Freq: Four times a day (QID) | ORAL | Status: DC | PRN
  Administered 2024-01-11 – 2024-01-13 (×2): 650 mg via ORAL
  Filled 2024-01-11 (×2): qty 2

## 2024-01-11 MED ORDER — SODIUM CHLORIDE 0.9 % IV SOLN: 12.5000 mg | Freq: Four times a day (QID) | INTRAVENOUS | Status: AC | PRN

## 2024-01-11 MED ORDER — TRAZODONE HCL 50 MG PO TABS
50.0000 mg | ORAL_TABLET | Freq: Every evening | ORAL | Status: DC | PRN
  Administered 2024-01-11 – 2024-01-13 (×3): 50 mg via ORAL
  Filled 2024-01-11 (×3): qty 1

## 2024-01-11 MED ORDER — METOPROLOL TARTRATE 25 MG PO TABS
25.0000 mg | ORAL_TABLET | Freq: Two times a day (BID) | ORAL | Status: DC
  Administered 2024-01-11: 25 mg via ORAL
  Filled 2024-01-11: qty 1

## 2024-01-11 MED ORDER — CYCLOBENZAPRINE HCL 5 MG PO TABS
5.0000 mg | ORAL_TABLET | Freq: Three times a day (TID) | ORAL | Status: DC | PRN
Start: 2024-01-11 — End: 2024-01-14

## 2024-01-11 MED ORDER — ATORVASTATIN CALCIUM 40 MG PO TABS
40.0000 mg | ORAL_TABLET | Freq: Every day | ORAL | Status: DC
  Administered 2024-01-11 – 2024-01-14 (×4): 40 mg via ORAL
  Filled 2024-01-11 (×4): qty 1

## 2024-01-11 MED ORDER — MECLIZINE HCL 25 MG PO TABS
12.5000 mg | ORAL_TABLET | Freq: Three times a day (TID) | ORAL | Status: DC | PRN
Start: 2024-01-11 — End: 2024-01-11

## 2024-01-11 MED ORDER — SODIUM CHLORIDE 0.9% FLUSH
3.0000 mL | Freq: Two times a day (BID) | INTRAVENOUS | Status: DC
  Administered 2024-01-11 – 2024-01-14 (×6): 3 mL via INTRAVENOUS

## 2024-01-11 MED ORDER — ENOXAPARIN SODIUM 40 MG/0.4ML IJ SOSY
40.0000 mg | PREFILLED_SYRINGE | Freq: Every day | INTRAMUSCULAR | Status: DC
  Administered 2024-01-11 – 2024-01-13 (×3): 40 mg via SUBCUTANEOUS
  Filled 2024-01-11 (×3): qty 0.4

## 2024-01-11 MED ORDER — ACETAMINOPHEN 650 MG RE SUPP
650.0000 mg | Freq: Four times a day (QID) | RECTAL | Status: DC | PRN
Start: 2024-01-11 — End: 2024-01-14

## 2024-01-11 MED ORDER — ALBUTEROL SULFATE (2.5 MG/3ML) 0.083% IN NEBU: 3.0000 mL | INHALATION_SOLUTION | Freq: Four times a day (QID) | RESPIRATORY_TRACT | Status: DC | PRN

## 2024-01-11 MED ORDER — SODIUM CHLORIDE 0.9% FLUSH
3.0000 mL | INTRAVENOUS | Status: DC | PRN
Start: 2024-01-11 — End: 2024-01-14

## 2024-01-11 MED ORDER — ONDANSETRON HCL 4 MG PO TABS: 4.0000 mg | ORAL_TABLET | Freq: Four times a day (QID) | ORAL | Status: DC | PRN

## 2024-01-11 MED ORDER — ONDANSETRON HCL 4 MG/2ML IJ SOLN: 4.0000 mg | Freq: Four times a day (QID) | INTRAMUSCULAR | Status: DC | PRN

## 2024-01-11 MED ORDER — HYDRALAZINE HCL 20 MG/ML IJ SOLN
5.0000 mg | Freq: Four times a day (QID) | INTRAMUSCULAR | Status: DC | PRN
  Administered 2024-01-13: 5 mg via INTRAVENOUS
  Filled 2024-01-11: qty 1

## 2024-01-11 MED ORDER — PANTOPRAZOLE SODIUM 40 MG PO TBEC
40.0000 mg | DELAYED_RELEASE_TABLET | Freq: Every day | ORAL | Status: DC
  Administered 2024-01-11 – 2024-01-14 (×4): 40 mg via ORAL
  Filled 2024-01-11 (×4): qty 1

## 2024-01-11 MED ORDER — SODIUM CHLORIDE 0.9 % IV SOLN: 250.0000 mL | INTRAVENOUS | Status: AC | PRN

## 2024-01-11 MED ORDER — VENLAFAXINE HCL ER 150 MG PO CP24
150.0000 mg | ORAL_CAPSULE | Freq: Every day | ORAL | Status: DC
  Administered 2024-01-11 – 2024-01-14 (×4): 150 mg via ORAL
  Filled 2024-01-11 (×4): qty 1

## 2024-01-11 MED ORDER — METOPROLOL TARTRATE 25 MG PO TABS
25.0000 mg | ORAL_TABLET | Freq: Two times a day (BID) | ORAL | Status: DC
  Administered 2024-01-11 – 2024-01-13 (×6): 25 mg via ORAL
  Filled 2024-01-11 (×6): qty 1

## 2024-01-11 MED ORDER — LACTATED RINGERS IV SOLN: INTRAVENOUS | Status: AC

## 2024-01-11 MED ORDER — SODIUM CHLORIDE 0.9% FLUSH
3.0000 mL | Freq: Two times a day (BID) | INTRAVENOUS | Status: DC
  Administered 2024-01-11 – 2024-01-13 (×3): 3 mL via INTRAVENOUS

## 2024-01-11 NOTE — Care Management Obs Status (Signed)
 MEDICARE OBSERVATION STATUS NOTIFICATION   Patient Details  Name: ASPYN WARNKE MRN: [id number] Date of Birth: 03-Nov-1945   Medicare Observation Status Notification Given:  Yes    Nena LITTIE Coffee, RN 01/11/2024, 2:19 PM

## 2024-01-11 NOTE — Assessment & Plan Note (Addendum)
 Fall precautions PT/OT evaluations --SNF recommended for rehab Treat for BPPV

## 2024-01-11 NOTE — Progress Notes (Signed)
 [facility]  EXPEDITER LEVEL LOADING ASSESSMENT NOTE  Patient Name: Tonya Bartlett  DOB:12/05/1945 Date of Admission: 01/10/2024  Date of Assessment:01/11/24   -------------------------------------------------------------------------------------------------------------------   Brief clinical summary: 78 yo female PMH HTN, GERD migraine, GAD, HLD, admitted for AKI, MD wants the pt to stay at [facility] for possible aadmission to [facility]  Is there Bed Availability at another [place]? Yes  If yes, what facility: [facility]  Level of Care Needed:  Yes  MD Agree to transfer: No  Patient agrees to transfer: Yes    -------------------------------------------------------------------------------------------------------------------  [facility] RN Expediter, Sharolyn JONETTA Batman Please contact us  directly via secure chat (search for [facility]) or by calling us  at [phone] [facility]).

## 2024-01-11 NOTE — ED Notes (Signed)
 This RN assisted patient with bedpan. Pts bedding changed. No other complaints at this time.

## 2024-01-11 NOTE — Evaluation (Addendum)
 Occupational Therapy Evaluation Patient Details Name: Tonya Bartlett MRN: [id number] DOB: 06-12-1945 Today's Date: 01/11/2024   History of Present Illness   Pt is a 78 y.o. female presenting 11/24 with multiple falls in the past week she attributes to LLE weakness and disequilibrium.   PMH: HTN, GERD, migraine, GAD, chronic pain syndrome, HLD. UA suggests UTI; MRI brain negative; MRI lumbar spine re demonstrates L1-2 compression fracture.     Clinical Impressions PTA, pt lived alone and was mod I for BADL, but endorses multiple falls; pt also occasionally drives, but reports her children do not want her to. Upon eval, pt presents with decreased balance, strength, dizziness. Pt needing up to set-up for UB ADL and CGA for LB ADL. Pt to continue to benefit from acute OT services. Pt to continue to benefit from acute OT services. Per family request due to frequent falls and pt without 24/7 support, recommend inpatient rehab <3 hours.       If plan is discharge home, recommend the following:   A little help with walking and/or transfers;A little help with bathing/dressing/bathroom;Assistance with cooking/housework;Assist for transportation;Help with stairs or ramp for entrance     Functional Status Assessment   Patient has had a recent decline in their functional status and demonstrates the ability to make significant improvements in function in a reasonable and predictable amount of time.     Equipment Recommendations   None recommended by OT     Recommendations for Other Services         Precautions/Restrictions   Precautions Precautions: Fall     Mobility Bed Mobility Overal bed mobility: Needs Assistance             General bed mobility comments: EOB on arrival, moving into long sit for [place] as part of PT eval with min A for guidance. general bed mobility not assessed    Transfers Overall transfer level: Needs assistance Equipment used: None Transfers:  Sit to/from Stand Sit to Stand: Supervision           General transfer comment: for safety      Balance Overall balance assessment: Needs assistance Sitting-balance support: No upper extremity supported, Feet supported Sitting balance-Leahy Scale: Good     Standing balance support: No upper extremity supported, During functional activity, Single extremity supported Standing balance-Leahy Scale: Fair Standing balance comment: mildly unsteady gait                           ADL either performed or assessed with clinical judgement   ADL Overall ADL's : Needs assistance/impaired Eating/Feeding: Independent;Sitting   Grooming: Contact guard assist;Standing;Supervision/safety   Upper Body Bathing: Set up;Sitting   Lower Body Bathing: Contact guard assist;Supervison/ safety;Sit to/from stand   Upper Body Dressing : Sitting;Set up   Lower Body Dressing: Contact guard assist;Supervision/safety;Sit to/from stand Lower Body Dressing Details (indicate cue type and reason): wears slip in shoes without socks Toilet Transfer: Contact guard assist;Ambulation   Toileting- Clothing Manipulation and Hygiene: Supervision/safety;Sitting/lateral lean       Functional mobility during ADLs: Contact guard assist       Vision   Additional Comments: pt with BPPV but no direct vision compaints     Perception         Praxis         Pertinent Vitals/Pain Pain Assessment Pain Assessment: Faces Faces Pain Scale: Hurts little more Pain Location: back, neck Pain Descriptors / Indicators: Discomfort, Guarding Pain  Intervention(s): Limited activity within patient's tolerance, Monitored during session     Extremity/Trunk Assessment Upper Extremity Assessment Upper Extremity Assessment: Generalized weakness;RUE deficits/detail RUE Deficits / Details: pt reports recent qrist fx, brace on during session, encouraged pt not to weightbear as fx 1 week ago per her report. using  functionally throughout session   Lower Extremity Assessment Lower Extremity Assessment: Defer to PT evaluation;Generalized weakness       Communication Communication Communication: No apparent difficulties   Cognition Arousal: Alert Behavior During Therapy: WFL for tasks assessed/performed Cognition: No apparent impairments                               Following commands: Intact       Cueing  General Comments   Cueing Techniques: Verbal cues      Exercises     Shoulder Instructions      Home Living Family/patient expects to be discharged to:: Private residence Living Arrangements: Alone Available Help at Discharge: Family;Available PRN/intermittently Type of Home: House Home Access: Stairs to enter [employer] of Steps: 7 Entrance Stairs-Rails: Left Home Layout: One level     Bathroom Shower/Tub: Producer, Television/film/video: Standard     Home Equipment: Agricultural Consultant (2 wheels);Cane - single point;Rollator (4 wheels);Grab bars - tub/shower;Grab bars - toilet (toilet rise vs BSC, pt did not clarify)          Prior Functioning/Environment Prior Level of Function : Independent/Modified Independent;Driving;History of Falls (last six months) (pt reports 17 falls in past 7 months)               ADLs Comments: mod I for BADL    OT Problem List: Decreased strength;Decreased activity tolerance;Impaired balance (sitting and/or standing);Decreased safety awareness;Decreased knowledge of precautions;Decreased knowledge of use of DME or AE;Pain   OT Treatment/Interventions: Self-care/ADL training;Therapeutic exercise;DME and/or AE instruction;Therapeutic activities;Patient/family education;Balance training      OT Goals(Current goals can be found in the care plan section)   Acute Rehab OT Goals Patient Stated Goal: get better/ no falls OT Goal Formulation: With patient Time For Goal Achievement: 01/25/24 Potential to  Achieve Goals: Good   OT Frequency:  Min 2X/week    Co-evaluation              AM-PAC OT 6 Clicks Daily Activity     Outcome Measure Help from another person eating meals?: None Help from another person taking care of personal grooming?: A Little Help from another person toileting, which includes using toliet, bedpan, or urinal?: A Little Help from another person bathing (including washing, rinsing, drying)?: A Little Help from another person to put on and taking off regular upper body clothing?: A Little Help from another person to put on and taking off regular lower body clothing?: A Little 6 Click Score: 19   End of Session Equipment Utilized During Treatment: Gait belt Nurse Communication: Mobility status  Activity Tolerance: Patient tolerated treatment well Patient left: in bed (PT in room)  OT Visit Diagnosis: Unsteadiness on feet (R26.81);Muscle weakness (generalized) (M62.81);Pain;BPPV BPPV - Right/Left: Right (per vestibular PT eval)                Time: 8741-8679 OT Time Calculation (min): 22 min Charges:  OT General Charges $OT Visit: 1 Visit OT Evaluation $OT Eval Low Complexity: 1 Low  Elma JONETTA Lebron FREDERICK, OTR/L [facility] Acute Rehabilitation Office: [phone]   Elma JONETTA Lebron 01/11/2024, 2:18  PM

## 2024-01-11 NOTE — ED Notes (Signed)
 Pt ambulated to BR with walker and standby assist. Tolerated well.

## 2024-01-11 NOTE — Assessment & Plan Note (Signed)
 Continue atorvastatin  40 mg daily.

## 2024-01-11 NOTE — H&P (Addendum)
 History and Physical    MESHELLE HOLNESS FMW:[id number] DOB: Sep 03, 1945 DOA: 01/10/2024  PCP: Merna Huxley, NP   Patient coming from: Home   Chief Complaint:  Chief Complaint  Patient presents with   Fall   ED TRIAGE note:  HPI:  Tonya Bartlett is a 78 y.o. female with medical history significant of   hypertension, GERD, migraine, generalized anxiety disorder, chronic pain syndrome, insomnia, and hyperlipidemia presented to emergency department valuation for multiple fall in last 1 week.  Patient reported it has been getting worse for past few days.  Patient hit her head multiple times when falling.  Denies any recent head trauma.  Reported having disequilibrium and left-sided lower extremity weakness that is causing recurrent fall. Patient reported she has history of lumbar radiculopathy and has been seen by neurosurgery in the past however it has been recently getting worse.  Complaining about back pain and it is getting worse since multiple fall.  During my evaluation the bedside patient reported that she fell dizzy when positioning her head left and right side.  Denies any postural dizziness however it is hard for differentiate for patient given sudden movement of the head can cause significant dizziness per patient. Patient denies any associated palpitation.  Denies any chest pain, shortness of breath, nausea and vomiting.  No other complaint at this time.  At presentation to ED patient is hemodynamically stable. Lab, CBC unremarkable.  CMP showed low bicarb 21, elevated creatinine 1.21, low GFR 46.  Normal ammonia level.  CBC unremarkable normal WBC count, stable H&H normal platelet count.  UA evidence of UTI.  Normal ammonia level. normal sinus rhythm heart rate 81, left ventricular hypertrophy pattern   MRI of the brain no evidence of acute intracranial abnormality.  Chronic microvascular ischemic changes.  MRI lumbar spine redemonstrate compression fracture L1-L2.  In the  ED patient received 1 L of NS bolus.  Hospitalist consulted for further evaluation management of recurrent fall and AKI.    Significant labs in the ED: Lab Orders         Comprehensive metabolic panel         CBC         Urinalysis, Routine w reflex microscopic -Urine, Clean Catch         Ammonia         CK         Comprehensive metabolic panel         CBC         Creatinine, urine, random         Sodium, urine, random       Review of Systems:  Review of Systems  Constitutional:  Negative for weight loss.  HENT:  Negative for hearing loss and tinnitus.   Respiratory:  Negative for cough, sputum production and shortness of breath.   Cardiovascular:  Negative for chest pain and palpitations.  Gastrointestinal:  Negative for heartburn, nausea and vomiting.  Musculoskeletal:  Positive for falls.  Neurological:  Positive for dizziness. Negative for tingling, tremors, sensory change, speech change, focal weakness, seizures, loss of consciousness, weakness and headaches.  Psychiatric/Behavioral:  The patient is not nervous/anxious.     Past Medical History:  Diagnosis Date   Alcohol abuse    Sober 41 years.   Allergic rhinitis    Allergy    Anemia    Anxiety    Blood transfusion without reported diagnosis    Breast cancer (HCC) 12/2020   left   Cancer (  HCC)    Cataract    bil cataracts removed   Clotting disorder    DVT- 1996 po knee replacement   Colitis    Complication of anesthesia    vomit x1 while on Morphine per pt   Depression    Diverticulosis    Dysrhythmia    Atrial fibrillation   Fatigue    Frequency of urination    GERD (gastroesophageal reflux disease)    History of colon polyps    History of DVT of lower extremity    POST LEFT TOTAL KNEE  1996   History of hiatal hernia    History of iron deficiency anemia 1996   iron infusion   History of migraine headaches    History of radiation therapy    left breast 03/27/2021-04/23/2021 Dr Lynwood Nasuti    History of rib fracture    Hyperlipidemia    Hypertension    IBS (irritable bowel syndrome)    Insomnia    Joint pain    MCI (mild cognitive impairment)    Melanoma (HCC) 2019   left leg    Migraine headache    hx of migraines    OA (osteoarthritis)    RIGHT SHOULDER   Osteopenia    Personal history of radiation therapy    Pneumonia    remote history   PONV (postoperative nausea and vomiting)    Recovering alcoholic (HCC)    SINCE 01-21-1980   Right rotator cuff tear    SOB (shortness of breath) on exertion    Substance abuse (HCC)    recovering alcoholic   Swallowing difficulty    Unspecified essential hypertension     Past Surgical History:  Procedure Laterality Date   ATRIAL FIBRILLATION ABLATION N/A 10/13/2022   Procedure: ATRIAL FIBRILLATION ABLATION;  Surgeon: Cindie Ole DASEN, MD;  Location: [facility];  Service: Cardiovascular;  Laterality: N/A;   BREAST BIOPSY Left 01/06/2021   pos   BREAST LUMPECTOMY     BREAST LUMPECTOMY WITH RADIOACTIVE SEED LOCALIZATION Left 02/27/2021   Procedure: LEFT BREAST LUMPECTOMY WITH RADIOACTIVE SEED LOCALIZATION;  Surgeon: Aron Shoulders, MD;  Location: [facility];  Service: General;  Laterality: Left;   BUNIONECTOMY/  HAMMERTOE CORRECTION  RIGHT FOOT  2011   CATARACT EXTRACTION W/ INTRAOCULAR LENS  IMPLANT, BILATERAL     COLONOSCOPY     DILATION AND CURETTAGE OF UTERUS  1976   EYE SURGERY Bilateral 2016   cataract removal   KNEE ARTHROSCOPY W/ MENISCECTOMY Bilateral X2  LEFT /    X1  RIGHT   KNEE OPEN LATERAL RELEASE Bilateral    MOHS SURGERY Left 12/15/2017   Melanoma in situ - left calf - [facility]   ORIF ANKLE FRACTURE Right 08/04/2020   Procedure: OPEN REDUCTION INTERNAL FIXATION (ORIF) ANKLE FRACTURE;  Surgeon: Kit Rush, MD;  Location: [facility];  Service: Orthopedics;  Laterality: Right;  Mini C-arm, [employer] Small Frag   REPLACEMENT TOTAL KNEE Left 2006   SHOULDER ARTHROSCOPY WITH SUBACROMIAL  DECOMPRESSION, ROTATOR CUFF REPAIR AND BICEP TENDON REPAIR Right 05/23/2013   Procedure: RIGHT SHOULDER ARTHROSCOPY EXAM UNDER ANESTHESIA  WITH SUBACROMIAL DECOMPRESSION,DISTAL CLAVICLE RESECTION, SADLABRAL DEBRIDEMENT CHONDROPLASTY, BICEP TENOTOMY ;  Surgeon: Lamar Collet, MD;  Location: [facility] ;  Service: Orthopedics;  Laterality: Right;   TONSILLECTOMY AND ADENOIDECTOMY  AGE 18   TOTAL HIP ARTHROPLASTY Left 05/04/2017   Procedure: LEFT TOTAL HIP ARTHROPLASTY ANTERIOR APPROACH;  Surgeon: Ernie Cough, MD;  Location: [facility];  Service: Orthopedics;  Laterality: Left;   TOTAL HIP ARTHROPLASTY Right 08/01/2019   Procedure: TOTAL HIP ARTHROPLASTY ANTERIOR APPROACH;  Surgeon: Ernie Cough, MD;  Location: [facility];  Service: Orthopedics;  Laterality: Right;    TOTAL KNEE ARTHROPLASTY Bilateral LEFT  1996/   RIGHT 2004   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 Her smoking use included cigarettes. She started smoking about 53 years ago. She has a 7.5 pack-year smoking history. She has never used smokeless tobacco. She reports that she does not drink alcohol and does not use drugs.  Allergies  Allergen Reactions   Cuprimine [Penicillamine] Anaphylaxis   Nsaids Anaphylaxis and Other (See Comments)    Severe stomach cramping Mouth sores   Otezla [Apremilast] Anaphylaxis    Suicidal ideation   Penicillins Anaphylaxis   Erythromycin Other (See Comments)    SEVERE STOMACH CRAMPS   Morphine And Codeine Nausea And Vomiting   Remicade  [Infliximab ] Other (See Comments) and Hypertension    Shut down immune system     Skyrizi [Risankizumab] Other (See Comments) and Cough    Upper respiratory infection Urinary tract infection Fever   Tolectin [Tolmetin] Other (See Comments)    Severe stomach cramping   Nickel Rash    Including snaps on  hospital gowns     Family History  Problem Relation Age of Onset   Cancer Mother    Heart disease Mother    Hypertension Mother    Melanoma Mother 52   Obesity Mother    Heart disease Father 71   Hypertension Father    Esophageal cancer Brother    Dementia Paternal Grandfather    Melanoma Niece 63   Colon cancer Neg Hx    Colon polyps Neg Hx    Stomach cancer Neg Hx    Rectal cancer Neg Hx     Prior to Admission medications   Medication Sig Start Date End Date Taking? Authorizing Provider  acetaminophen  (TYLENOL ) 325 MG tablet Take 2 tablets (650 mg total) by mouth every 4 (four) hours as needed for mild pain (pain score 1-3) (or Fever >/= 101). 04/20/23  Yes Rosario Leatrice FERNS, MD  albuterol  (VENTOLIN  HFA) 108 (90 Base) MCG/ACT inhaler INHALE 2 PUFFS INTO THE LUNGS EVERY 6 (SIX) HOURS AS NEEDED FOR WHEEZING OR SHORTNESS OF BREATH (COUGH ). START WITH USING IN AM, AROUND NOON AND 1 HOUR BEFORE BEDTIME TO HELP COUGH SYMPTOMS. 04/20/23  Yes Nafziger, Darleene, NP  atorvastatin  (LIPITOR) 40 MG tablet TAKE 1 TABLET BY MOUTH EVERY DAY 11/08/23  Yes Court Dorn PARAS, MD  buPROPion  ER (WELLBUTRIN  SR) 150 MG 12 hr tablet Take 1 tablet (150 mg total) by mouth daily. 12/28/23  Yes Geralene Kaiser, MD  cetirizine (ZYRTEC) 10 MG tablet Take 10 mg by mouth daily.   Yes [provider]  cyclobenzaprine  (FLEXERIL ) 5 MG tablet Take 1 tablet (5 mg total) by mouth 3 (three) times daily as needed for muscle spasms. 04/20/23  Yes Rosario Leatrice FERNS, MD  HYDROcodone -acetaminophen  (NORCO/VICODIN) 5-325 MG tablet Take 2 tablets by mouth every 4 (four) hours as needed for moderate pain (pain score 4-6) or severe pain (pain score 7-10). Patient taking differently: Take 1 tablet by mouth every 4 (four) hours as needed for moderate pain (pain score 4-6) or severe pain (pain  score 7-10). 04/20/23  Yes Rosario Leatrice FERNS, MD  losartan  (COZAAR ) 100 MG tablet TAKE 1 TABLET BY MOUTH EVERY DAY 05/11/23  Yes Nafziger,  Darleene, NP  metoprolol  tartrate (LOPRESSOR ) 25 MG tablet TAKE 1 TABLET BY MOUTH TWICE A DAY 07/20/23  Yes Leverne Charlies Helling, PA-C  omeprazole  (PRILOSEC) 40 MG capsule Take 1 capsule (40 mg total) by mouth 2 (two) times daily. 09/28/23  Yes Nafziger, Darleene, NP  sulfamethoxazole -trimethoprim  (BACTRIM ,SEPTRA ) 400-80 MG tablet Take 1 tablet by mouth at bedtime.   Yes Ottelin, Mark, MD  SUMAtriptan  (IMITREX ) 100 MG tablet TAKE 1 TABLET (100 MG TOTAL) BY MOUTH EVERY 2 (TWO) HOURS AS NEEDED FOR MIGRAINE. MAY REPEAT IN 2 HOURS IF HEADACHE PERSISTS OR RECURS. 11/24/23  Yes Nafziger, Darleene, NP  traZODone  (DESYREL ) 50 MG tablet TAKE 1 TABLET BY MOUTH EVERYDAY AT BEDTIME 08/05/23  Yes Nafziger, Darleene, NP  venlafaxine  XR (EFFEXOR -XR) 150 MG 24 hr capsule Take 1 capsule (150 mg total) by mouth daily. 07/01/23  Yes Geralene Kaiser, MD     Physical Exam: Vitals:   01/10/24 2345 01/11/24 0215 01/11/24 0223 01/11/24 0340  BP: (!) 148/59 (!) 155/69 (!) 155/69   Pulse: 95  95   Resp: 16     Temp:    98.8 F (37.1 C)  TempSrc:    Oral  SpO2: 100%     Weight:      Height:        Physical Exam Vitals and nursing note reviewed.  Constitutional:      General: She is not in acute distress.    Appearance: She is not ill-appearing.  HENT:     Nose: Nose normal.     Mouth/Throat:     Mouth: Mucous membranes are moist.  Cardiovascular:     Rate and Rhythm: Normal rate and regular rhythm.     Pulses: Normal pulses.     Heart sounds: Normal heart sounds.  Pulmonary:     Effort: Pulmonary effort is normal.     Breath sounds: Normal breath sounds.  Abdominal:     Palpations: Abdomen is soft.  Musculoskeletal:     Cervical back: Neck supple.  Skin:    Capillary Refill: Capillary refill takes less than 2 seconds.  Neurological:     Mental Status: She is alert and oriented to person, place, and time.  Psychiatric:        Mood and Affect: Mood normal.      Labs on Admission: I have personally reviewed following  labs and imaging studies  CBC: Recent Labs  Lab 01/10/24 1331 01/11/24 0500  WBC 7.1 5.3  HGB 11.6* 9.8*  HCT 34.7* 29.7*  MCV 99.7 99.7  PLT 334 284   Basic Metabolic Panel: Recent Labs  Lab 01/10/24 1331  NA 137  K 3.6  CL 104  CO2 21*  GLUCOSE 95  BUN 24*  CREATININE 1.21*  CALCIUM  8.9   GFR: Estimated Creatinine Clearance: 42.1 mL/min (A) (by C-G formula based on SCr of 1.21 mg/dL (H)). Liver Function Tests: Recent Labs  Lab 01/10/24 1331  AST 36  ALT 27  ALKPHOS 72  BILITOT 0.8  PROT 6.9  ALBUMIN 3.2*   No results for input(s): LIPASE, AMYLASE in the last 168 hours. Recent Labs  Lab 01/10/24 1612  AMMONIA <13   Coagulation Profile: No results for input(s): INR, PROTIME in the last 168 hours. Cardiac Enzymes: Recent Labs  Lab 01/11/24 0505  CKTOTAL 98   BNP (last 3  results) No results for input(s): BNP in the last 8760 hours. HbA1C: No results for input(s): HGBA1C in the last 72 hours. CBG: No results for input(s): GLUCAP in the last 168 hours. Lipid Profile: No results for input(s): CHOL, HDL, LDLCALC, TRIG, CHOLHDL, LDLDIRECT in the last 72 hours. Thyroid  Function Tests: No results for input(s): TSH, T4TOTAL, FREET4, T3FREE, THYROIDAB in the last 72 hours. Anemia Panel: No results for input(s): VITAMINB12, FOLATE, FERRITIN, TIBC, IRON, RETICCTPCT in the last 72 hours. Urine analysis:    Component Value Date/Time   COLORURINE YELLOW 01/10/2024 2143   APPEARANCEUR CLEAR 01/10/2024 2143   LABSPEC 1.012 01/10/2024 2143   PHURINE 6.0 01/10/2024 2143   GLUCOSEU NEGATIVE 01/10/2024 2143   HGBUR NEGATIVE 01/10/2024 2143   HGBUR trace-lysed 10/29/2008 1057   BILIRUBINUR NEGATIVE 01/10/2024 2143   BILIRUBINUR N 07/20/2017 1134   KETONESUR NEGATIVE 01/10/2024 2143   PROTEINUR NEGATIVE 01/10/2024 2143   UROBILINOGEN 0.2 07/20/2017 1134   UROBILINOGEN 1.0 10/29/2014 0125   NITRITE NEGATIVE  01/10/2024 2143   LEUKOCYTESUR MODERATE (A) 01/10/2024 2143    Radiological Exams on Admission: I have personally reviewed images MR Brain W and Wo Contrast Result Date: 01/10/2024 EXAM: MRI BRAIN WITH AND WITHOUT CONTRAST 01/10/2024 06:37:00 PM TECHNIQUE: Multiplanar multisequence MRI of the head/brain was performed with and without the administration of 9 mL (gadobutrol  (GADAVIST ) 1 MMOL/ML injection 9 mL GADOBUTROL  1 MMOL/ML IV SOLN) intravenous contrast. COMPARISON: CT head 09/22/2023 and MRI head 09/20/2023. CLINICAL HISTORY: Mental status change, unknown cause. FINDINGS: BRAIN AND VENTRICLES: No acute infarct. No acute intracranial hemorrhage. No mass effect or midline shift. No hydrocephalus. The sella is unremarkable. Normal flow voids. No mass or abnormal enhancement. T2 and FLAIR hyperintensity in the periventricular and subcortical white matter similar to prior and suggestive of mild chronic microvascular ischemic changes. Similar related signal abnormality in the pons. Mild generalized parenchymal volume loss. Multiple sequences are degraded by motion artifact as well as artifact from dental hardware. ORBITS: Bilateral lens replacement. No acute abnormality. SINUSES: No acute abnormality. BONES AND SOFT TISSUES: Normal bone marrow signal and enhancement. No acute soft tissue abnormality. IMPRESSION: 1. No acute intracranial abnormality. 2. Mild chronic microvascular ischemic changes and mild generalized parenchymal volume loss. Electronically signed by: Donnice Mania MD 01/10/2024 07:51 PM EST RP Workstation: [id number]   MR LUMBAR SPINE WO CONTRAST Result Date: 01/10/2024 CLINICAL DATA:  Lumbar radiculopathy, trauma. EXAM: MRI LUMBAR SPINE WITHOUT CONTRAST TECHNIQUE: Multiplanar, multisequence MR imaging of the lumbar spine was performed. No intravenous contrast was administered. COMPARISON:  MRI lumbar spine 11/12/2023. FINDINGS: Segmentation: The lowest well-formed disc space is labeled  L5-S1. This disc space demonstrates moderate disc height loss related to degenerative changes, similar to prior. There is a rudimentary disc space at the S1-2 level. Numbering is concordant with the prior studies. Alignment: Lumbar lordosis is maintained. Similar grade 1 anterolisthesis of L4 on L5. Vertebrae: Redemonstrated compression fracture of L1 with proximally 80% height loss centrally overall similar to prior approximately 4-5 mm retropulsion at the inferior endplate of L1 is also similar to prior. Additional compression fracture of L2 which demonstrates up to 60% height loss, slighlty increased along the anterior aspect compared to prior. Schmorl's nodes at multiple levels. Vertebral body heights are otherwise maintained. Persistent edema within the L1 vertebral body which may be related to micro motion at the fracture site. There is slightly increased edema within the anterior aspect of the L2 vertebral body with slightly increased height loss anteriorly. No evidence  of additional acute fracture. Conus medullaris and cauda equina: Conus extends to the L2 level. Conus and cauda equina appear normal. Paraspinal and other soft tissues: Mild paraspinal muscular atrophy. Similar appearance of bilateral renal cysts. Disc levels: T12-L1: Small disc bulge indents the ventral thecal sac without evidence of significant spinal canal stenosis on sagittal images. Bilateral facet arthrosis. Mild foraminal stenosis on the left. L1-2: Retropulsion of fracture fragments at L1 along with disc bulge which indents the ventral thecal sac. Bilateral facet arthrosis indents the dorsal thecal sac. There is moderate to severe spinal canal stenosis which appears similar to prior. Similar compression of the conus medullaris and crowding of the cauda equina nerve roots. There is mild bilateral foraminal stenosis with partial contribution from retropulsion of fracture fragments overall similar to prior. L2-3: Small disc bulge moderate  facet arthrosis. Similar mild spinal canal stenosis. There is mild bilateral neural foraminal stenosis. L3-4: Small disc bulge. Moderate bilateral facet arthrosis and thickening of the ligamentum flavum. No significant spinal canal stenosis. No significant foraminal stenosis. L4-5: Partial uncovering of the disc. Small disc bulge. Mild lateral recess narrowing. Moderate to severe facet arthrosis. Mild thickening of the ligamentum flavum. There is no significant spinal canal stenosis. No significant foraminal stenosis. L5-S1: Moderate disc height loss. Small right paracentral disc protrusion. Moderate bilateral facet arthrosis. Thickening of the ligamentum flavum. No significant spinal canal stenosis. There is mild foraminal stenosis on the left. IMPRESSION: Redemonstrated compression fractures of L1 and L2. Persistent edema at the L1 level may be related to micro-motion at the fracture site. Additionally there is increased edema anteriorly within the L2 vertebral body with increased height loss of L2 anteriorly. Degenerative changes as above overall similar to prior. Similar moderate to severe spinal canal stenosis at L1-2 secondary to retropulsion of fracture fragments and disc bulge. Similar mild compression of the conus medullaris. Electronically Signed   By: Donnice Mania M.D.   On: 01/10/2024 19:12   DG Lumbar Spine Complete Result Date: 01/10/2024 CLINICAL DATA:  Fall EXAM: LUMBAR SPINE - COMPLETE 4+ VIEW COMPARISON:  MRI lumbar spine 926 2025 FINDINGS: The bones are diffusely osteopenic. Compression deformities of L1 and L2 appear similar to the prior study. There is retropulsion of fracture fragments at L1, unchanged. No new fractures are seen. At L4-L5 there is 2 mm of anterolisthesis which is stable. There is severe disc space narrowing at L5-S1. Bilateral hip arthroplasties are present. IMPRESSION: 1. No acute fracture. 2. Stable compression deformities of L1 and L2. 3. Stable grade 1 anterolisthesis  at L4-L5. Electronically Signed   By: Greig Pique M.D.   On: 01/10/2024 14:58      Assessment/Plan: Principal Problem:   AKI (acute kidney injury) Active Problems:   Vertigo   Hyperlipidemia   Essential hypertension   Recurrent falls   GAD (generalized anxiety disorder)   Chronic pain syndrome   Lumbar radiculopathy, chronic    Assessment and Plan: Acute kidney injury -Presented emergency department complaining of recurrent fall.  Per patient she experienced more than 10 episodes of fall over the course of neck 7 days due to dizziness. At presentation to ED patient is hemodynamically stable. -Lab work showed elevated creatinine 1.21 otherwise unremarkable. - UA showing evidence of UTI however patient is asymptomatic.  Unclear etiology of etiology of AKI.   -In ED patient resuscitated with 1 L of LR bolus.  Continue maintenance fluid LR 75 cc/h. - Continue to monitor improvement of renal function, avoid nephrotoxic agent and monitor  urine output.   Recurrent fall and Vertigo -Presenting with complaining of recurrent fall secondary to vertigo.  Concern for underlying BPPV. Lab, CBC unremarkable.  CMP showed low bicarb 21, elevated creatinine 1.21, low GFR 46.  Normal ammonia level.  CBC unremarkable normal WBC count, stable H&H normal platelet count.  UA evidence of UTI.  Normal ammonia level. EKG normal sinus rhythm heart rate 81, left ventricular hypertrophy pattern. - MRI of the brain no evidence of acute intracranial abnormality.  Chronic microvascular ischemic changes. - MRI lumbar spine redemonstrate compression fracture L1-L2. -MRI ruled out brainstem stroke.  Patient denies any focal or generalized weakness.  Denies any loss of consciousness and seizure. - Recurrent fall in the setting of vertigo probably underlying BPPV. - Starting meclizine  12.5 mg 3 times daily. - Consulting PT vestibular therapy. - Consulting with PT and OT therapy as well for evaluation for  balance.   Essential hypertension - elevated blood pressure in upper 150s range.  Starting home medication Lopressor  25 mg twice daily.  Losartan  on hold in the setting of AKI. -Continue IV hydralazine  as needed  Hyperlipidemia -Continue Lipitor   Chronic pain syndrome History of lumbar radiculopathy -Continue Tylenol  as needed.  Insomnia -Continue trazodone  at bedtime as needed  Generalized anxiety disorder -Continue BuSpar and Effexor    DVT prophylaxis:  SCDs and TED hose.  Deferring pharmacological DVT prophylaxis in setting of recurrent fall and high risk of bleeding Code Status:  Full Code Diet: Heart healthy diet Family Communication:   Family was present at bedside, at the time of interview. Opportunity was given to ask question and all questions were answered satisfactorily.  Disposition Plan: Continue to monitor improvement of renal function. Consults:   PT and OT therapy, PT vestibular therapy Admission status:   Observation, Med-Surg  Severity of Illness: The appropriate patient status for this patient is OBSERVATION. Observation status is judged to be reasonable and necessary in order to provide the required intensity of service to ensure the patient's safety. The patient's presenting symptoms, physical exam findings, and initial radiographic and laboratory data in the context of their medical condition is felt to place them at decreased risk for further clinical deterioration. Furthermore, it is anticipated that the patient will be medically stable for discharge from the hospital within 2 midnights of admission.     Stacy Deshler, MD [facility]  How to contact the [facility] Attending or Consulting provider 7A - 7P or covering provider during after hours 7P -7A, for this patient.  Check the care team in [facility] and look for a) attending/consulting [facility] provider listed and b) the [facility] team listed Log into [email or web address] and use [facility] [employer] [facility] universal password to  access. If you do not have the password, please contact the hospital operator. Locate the [facility] provider you are looking for under [facility] and page to a number that you can be directly reached. If you still have difficulty reaching the provider, please page the [facility] (Director on Call) for the Hospitalists listed on amion for assistance.  01/11/2024, 6:07 AM

## 2024-01-11 NOTE — Progress Notes (Signed)
 PROGRESS NOTE  Tonya Bartlett  FMW:[id number] DOB: 06-Mar-1945 DOA: 01/10/2024 PCP: Merna Huxley, NP   78 year old female past medical history of hypertension GERD, migraine, generalized anxiety disorder, chronic pain syndrome, insomnia, and hyperlipidemia presented to emergency department valuation for multiple fall in last 1 week.  Patient reported it has been getting worse for past few days.  Patient hit her head multiple times when falling.  Denies any recent head trauma.  Reported having disequilibrium and left-sided lower extremity weakness that is causing recurrent fall. Patient reported she has history of lumbar radiculopathy and has been seen by neurosurgery in the past however it has been recently getting worse.  Complaining about back pain and it is getting worse since multiple fall.  At presentation to ED patient is hemodynamically stable. Lab, CBC unremarkable.  CMP showed low bicarb 21, elevated creatinine 1.21, low GFR 46.  Normal ammonia level.  CBC unremarkable normal WBC count, stable H&H normal platelet count.  UA evidence of UTI.  Normal ammonia level.  MRI of the brain no evidence of acute intracranial abnormality.  Chronic microvascular ischemic changes.  MRI lumbar spine redemonstrate compression fracture L1-L2.  In the ED patient received 1 L of NS bolus.  Hospitalist consulted for further evaluation management of recurrent fall and AKI.  11/25: assumed care of patient. AKI nearly resolved and anticipate complete resolution tomorrow.  She had vestibular physical therapy today and likely will need 2-3 more sessions pending their recommendation.  Continue precaution.  PT, OT, vestibular PT, TOC consulted.  Assessment & Plan:   Principal Problem:   AKI (acute kidney injury) Active Problems:   Vertigo   Hyperlipidemia   Essential hypertension   Recurrent falls   GAD (generalized anxiety disorder)   Chronic pain syndrome   Lumbar radiculopathy, chronic    Assessment and Plan:  * AKI (acute kidney injury) Improving With hypokalemia-potassium chloride  40 mill colons p.o. one-time dose ordered Continue LR infusion at 100 mL/h, for 1 more day Recheck BMP in a.m.  Vertigo Suspect BPPV, localized to the right side Continue vestibular physical therapy Continue physical therapy, Occupational Therapy Fall precaution  Chronic pain syndrome PDMP reviewed Patient active prescription for hydrocodone -acetaminophen  5-325 mg p.o. written on 10/20, filled on 10/28 for 30-day supply  GAD (generalized anxiety disorder) Home bupropion  100 mg daily, venlafaxine  150 mg daily  Recurrent falls Fall precaution  Essential hypertension Home metoprolol  tartrate 25 mg p.o. twice daily resumed Hydralazine  5 mg IV every 6 hours as needed for SBP greater 165, 5 days ordered  Hyperlipidemia Atorvastatin  40 mg daily  DVT prophylaxis: Enoxaparin  Code Status: Full code Family Communication: No Disposition Plan: Pending clinical course Level of care: Med-Surg  Consultants:  PT, OT, vestibular physical therapy, TOC  Procedures:  Epley maneuver  Antimicrobials: None indicated  Subjective:  At bedside, patient able to tell me her first and last name, age, location, current calendar year.  She does not appear to be in acute distress.  She denies dysuria, hematuria, diarrhea, chest pain, shortness of breath.  She reports she still gets dizzy however does feel better than yesterday.  Objective: Vitals:   01/11/24 0340 01/11/24 0629 01/11/24 0803 01/11/24 1246  BP:  (!) 161/103 117/89 (!) 148/87  Pulse:  80 71 72  Resp:  16 16 14   Temp: 98.8 F (37.1 C) 98.7 F (37.1 C) 98.4 F (36.9 C) 98.1 F (36.7 C)  TempSrc: Oral Oral Oral Oral  SpO2:  100% 99% 98%  Weight:  Height:        Intake/Output Summary (Last 24 hours) at 01/11/2024 1624 Last data filed at 01/11/2024 0219 Gross per 24 hour  Intake 1000 ml  Output --  Net 1000 ml    Filed Weights   01/10/24 1223  Weight: 91.6 kg   Examination:  General exam: Appears calm and comfortable  Respiratory system: Clear to auscultation. Respiratory effort normal. Cardiovascular system: S1 & S2 heard, RRR. No JVD, murmurs, rubs, gallops or clicks. No pedal edema. Gastrointestinal system: Abdomen is nondistended, soft and nontender. No organomegaly or masses felt. Normal bowel sounds heard. Central nervous system: Alert and oriented. No focal neurological deficits. Extremities: Symmetric 5 x 5 power. Skin: No rashes, lesions or ulcers Psychiatry: Judgement and insight appear normal. Mood & affect appropriate.   Data Reviewed: I have personally reviewed following labs and imaging studies  CBC: Recent Labs  Lab 01/10/24 1331 01/11/24 0500  WBC 7.1 5.3  HGB 11.6* 9.8*  HCT 34.7* 29.7*  MCV 99.7 99.7  PLT 334 284   Basic Metabolic Panel: Recent Labs  Lab 01/10/24 1331 01/11/24 0500  NA 137 140  K 3.6 3.2*  CL 104 106  CO2 21* 22  GLUCOSE 95 160*  BUN 24* 17  CREATININE 1.21* 1.02*  CALCIUM  8.9 8.5*  MG  --  2.1   GFR: Estimated Creatinine Clearance: 50 mL/min (A) (by C-G formula based on SCr of 1.02 mg/dL (H)).  Liver Function Tests: Recent Labs  Lab 01/10/24 1331 01/11/24 0500  AST 36 26  ALT 27 19  ALKPHOS 72 62  BILITOT 0.8 0.6  PROT 6.9 5.7*  ALBUMIN 3.2* 2.8*   Recent Labs  Lab 01/10/24 1612  AMMONIA <13   Cardiac Enzymes: Recent Labs  Lab 01/11/24 0505  CKTOTAL 98   BNP (last 3 results) Recent Labs    01/20/23 1059  PROBNP 219   Radiology Studies: MR Brain W and Wo Contrast Result Date: 01/10/2024 EXAM: MRI BRAIN WITH AND WITHOUT CONTRAST 01/10/2024 06:37:00 PM TECHNIQUE: Multiplanar multisequence MRI of the head/brain was performed with and without the administration of 9 mL (gadobutrol  (GADAVIST ) 1 MMOL/ML injection 9 mL GADOBUTROL  1 MMOL/ML IV SOLN) intravenous contrast. COMPARISON: CT head 09/22/2023 and MRI head  09/20/2023. CLINICAL HISTORY: Mental status change, unknown cause. FINDINGS: BRAIN AND VENTRICLES: No acute infarct. No acute intracranial hemorrhage. No mass effect or midline shift. No hydrocephalus. The sella is unremarkable. Normal flow voids. No mass or abnormal enhancement. T2 and FLAIR hyperintensity in the periventricular and subcortical white matter similar to prior and suggestive of mild chronic microvascular ischemic changes. Similar related signal abnormality in the pons. Mild generalized parenchymal volume loss. Multiple sequences are degraded by motion artifact as well as artifact from dental hardware. ORBITS: Bilateral lens replacement. No acute abnormality. SINUSES: No acute abnormality. BONES AND SOFT TISSUES: Normal bone marrow signal and enhancement. No acute soft tissue abnormality. IMPRESSION: 1. No acute intracranial abnormality. 2. Mild chronic microvascular ischemic changes and mild generalized parenchymal volume loss. Electronically signed by: Donnice Mania MD 01/10/2024 07:51 PM EST RP Workstation: [id number]   MR LUMBAR SPINE WO CONTRAST Result Date: 01/10/2024 CLINICAL DATA:  Lumbar radiculopathy, trauma. EXAM: MRI LUMBAR SPINE WITHOUT CONTRAST TECHNIQUE: Multiplanar, multisequence MR imaging of the lumbar spine was performed. No intravenous contrast was administered. COMPARISON:  MRI lumbar spine 11/12/2023. FINDINGS: Segmentation: The lowest well-formed disc space is labeled L5-S1. This disc space demonstrates moderate disc height loss related to degenerative changes, similar to prior. There  is a rudimentary disc space at the S1-2 level. Numbering is concordant with the prior studies. Alignment: Lumbar lordosis is maintained. Similar grade 1 anterolisthesis of L4 on L5. Vertebrae: Redemonstrated compression fracture of L1 with proximally 80% height loss centrally overall similar to prior approximately 4-5 mm retropulsion at the inferior endplate of L1 is also similar to prior.  Additional compression fracture of L2 which demonstrates up to 60% height loss, slighlty increased along the anterior aspect compared to prior. Schmorl's nodes at multiple levels. Vertebral body heights are otherwise maintained. Persistent edema within the L1 vertebral body which may be related to micro motion at the fracture site. There is slightly increased edema within the anterior aspect of the L2 vertebral body with slightly increased height loss anteriorly. No evidence of additional acute fracture. Conus medullaris and cauda equina: Conus extends to the L2 level. Conus and cauda equina appear normal. Paraspinal and other soft tissues: Mild paraspinal muscular atrophy. Similar appearance of bilateral renal cysts. Disc levels: T12-L1: Small disc bulge indents the ventral thecal sac without evidence of significant spinal canal stenosis on sagittal images. Bilateral facet arthrosis. Mild foraminal stenosis on the left. L1-2: Retropulsion of fracture fragments at L1 along with disc bulge which indents the ventral thecal sac. Bilateral facet arthrosis indents the dorsal thecal sac. There is moderate to severe spinal canal stenosis which appears similar to prior. Similar compression of the conus medullaris and crowding of the cauda equina nerve roots. There is mild bilateral foraminal stenosis with partial contribution from retropulsion of fracture fragments overall similar to prior. L2-3: Small disc bulge moderate facet arthrosis. Similar mild spinal canal stenosis. There is mild bilateral neural foraminal stenosis. L3-4: Small disc bulge. Moderate bilateral facet arthrosis and thickening of the ligamentum flavum. No significant spinal canal stenosis. No significant foraminal stenosis. L4-5: Partial uncovering of the disc. Small disc bulge. Mild lateral recess narrowing. Moderate to severe facet arthrosis. Mild thickening of the ligamentum flavum. There is no significant spinal canal stenosis. No significant  foraminal stenosis. L5-S1: Moderate disc height loss. Small right paracentral disc protrusion. Moderate bilateral facet arthrosis. Thickening of the ligamentum flavum. No significant spinal canal stenosis. There is mild foraminal stenosis on the left. IMPRESSION: Redemonstrated compression fractures of L1 and L2. Persistent edema at the L1 level may be related to micro-motion at the fracture site. Additionally there is increased edema anteriorly within the L2 vertebral body with increased height loss of L2 anteriorly. Degenerative changes as above overall similar to prior. Similar moderate to severe spinal canal stenosis at L1-2 secondary to retropulsion of fracture fragments and disc bulge. Similar mild compression of the conus medullaris. Electronically Signed   By: Donnice Mania M.D.   On: 01/10/2024 19:12   DG Lumbar Spine Complete Result Date: 01/10/2024 CLINICAL DATA:  Fall EXAM: LUMBAR SPINE - COMPLETE 4+ VIEW COMPARISON:  MRI lumbar spine 926 2025 FINDINGS: The bones are diffusely osteopenic. Compression deformities of L1 and L2 appear similar to the prior study. There is retropulsion of fracture fragments at L1, unchanged. No new fractures are seen. At L4-L5 there is 2 mm of anterolisthesis which is stable. There is severe disc space narrowing at L5-S1. Bilateral hip arthroplasties are present. IMPRESSION: 1. No acute fracture. 2. Stable compression deformities of L1 and L2. 3. Stable grade 1 anterolisthesis at L4-L5. Electronically Signed   By: Greig Pique M.D.   On: 01/10/2024 14:58   Scheduled Meds:  atorvastatin   40 mg Oral Daily   buPROPion   150  mg Oral Daily   meclizine   12.5 mg Oral TID   metoprolol  tartrate  25 mg Oral BID   pantoprazole   40 mg Oral Daily   sodium chloride  flush  3 mL Intravenous Q12H   sodium chloride  flush  3 mL Intravenous Q12H   venlafaxine  XR  150 mg Oral Daily   Continuous Infusions:  sodium chloride      lactated ringers  75 mL/hr at 01/11/24 1251    LOS:  0 days   Time spent: 50 minutes  Dr. Sherre [facility] If 7PM-7AM, please contact night-coverage 01/11/2024, 4:24 PM

## 2024-01-11 NOTE — Hospital Course (Addendum)
 78 year old female past medical history of hypertension GERD, migraine, generalized anxiety disorder, chronic pain syndrome, insomnia, and hyperlipidemia presented to emergency department valuation for multiple fall in last 1 week.  Patient reported it has been getting worse for past few days.  Patient hit her head multiple times when falling.  Denies any recent head trauma.  Reported having disequilibrium and left-sided lower extremity weakness that is causing recurrent fall. Patient reported she has history of lumbar radiculopathy and has been seen by neurosurgery in the past however it has been recently getting worse.  Complaining about back pain and it is getting worse since multiple fall.  At presentation to ED patient is hemodynamically stable. Lab, CBC unremarkable.  CMP showed low bicarb 21, elevated creatinine 1.21, low GFR 46.  Normal ammonia level.  CBC unremarkable normal WBC count, stable H&H normal platelet count.  UA evidence of UTI.  Normal ammonia level.  MRI of the brain no evidence of acute intracranial abnormality.  Chronic microvascular ischemic changes.  MRI lumbar spine redemonstrate compression fracture L1-L2.  In the ED patient received 1 L of NS bolus.  Hospitalist consulted for further evaluation management of recurrent fall and AKI.  11/25: AKI nearly resolved and anticipate complete resolution tomorrow.  She had vestibular physical therapy today and likely will need 2-3 more sessions pending their recommendation.  Continue precaution.  PT, OT, vestibular PT, TOC consulted.  11/26 -- 27:  pt doing well, AKI resolved.  Still with intermittent dizziness / vertigo.  Pt recommended for SNF for rehab & agreeable.  11/28 -- pt doing well.  Medically stable for discharge and accepted at [facility] for admission today for rehab.

## 2024-01-11 NOTE — Assessment & Plan Note (Signed)
 Home bupropion  100 mg daily, venlafaxine  150 mg daily

## 2024-01-11 NOTE — ED Notes (Signed)
 Pt ambulated to BR again with walker. Tolerated well.

## 2024-01-11 NOTE — Assessment & Plan Note (Addendum)
 AKI has resolved with IV fluids. Cr 0.75. Hypokalemia-resolved with replacement yesterday 11/25.  Last K 3.6 on 11/26 Completed IV fluids --Monitor BMP at follow up

## 2024-01-11 NOTE — Assessment & Plan Note (Addendum)
 Hypertensive urgency -Resolved.  BP was elevated up to 196/90 yesterday.  Patient denied headache or other symptoms 11/28: BP's improved to systolic 140's to 150's with losartan  resumed yesterday  --Continue losartan  100 mg daily --Increase metoprolol  tartrate 25 >> 50 mg BID --Hydralazine  IV PRN --Titrate regimen

## 2024-01-11 NOTE — Plan of Care (Signed)
  Problem: Education: Goal: Knowledge of General Education information will improve Description: Including pain rating scale, medication(s)/side effects and non-pharmacologic comfort measures Outcome: Progressing   Problem: Clinical Measurements: Goal: Ability to maintain clinical measurements within normal limits will improve Outcome: Progressing Goal: Cardiovascular complication will be avoided Outcome: Progressing   Problem: Nutrition: Goal: Adequate nutrition will be maintained Outcome: Progressing   Problem: Elimination: Goal: Will not experience complications related to bowel motility Outcome: Progressing Goal: Will not experience complications related to urinary retention Outcome: Progressing   Problem: Pain Managment: Goal: General experience of comfort will improve and/or be controlled Outcome: Progressing   Problem: Safety: Goal: Ability to remain free from injury will improve Outcome: Progressing   Problem: Skin Integrity: Goal: Risk for impaired skin integrity will decrease Outcome: Progressing   Problem: Health Behavior/Discharge Planning: Goal: Ability to manage health-related needs will improve Outcome: Not Progressing   Problem: Clinical Measurements: Goal: Will remain free from infection Outcome: Not Progressing Goal: Diagnostic test results will improve Outcome: Not Progressing   Problem: Activity: Goal: Risk for activity intolerance will decrease Outcome: Not Progressing

## 2024-01-11 NOTE — ED Notes (Signed)
 Patient was given a cup of ice and ginger ale.

## 2024-01-11 NOTE — Evaluation (Addendum)
 Physical Therapy Evaluation Patient Details Name: Tonya Bartlett MRN: [id number] DOB: [birth year] Today's Date: 01/11/2024  History of Present Illness  Pt is a 78 y.o. female presenting 11/24 with multiple falls in the past week she attributes to LLE weakness and disequilibrium.   PMH: HTN, GERD, migraine, GAD, chronic pain syndrome, HLD. UA suggests UTI; MRI brain negative; MRI lumbar spine re demonstrates L1-2 compression fracture.  Clinical Impression  Currently pt is presenting at supervision level for bed mobility, sit to stand and gait without an AD. Pt demonstrates positive Epley for R posterior canal BPPV with slow bursts indicative of thickened endolymph most likely related to dehydration. Pt does have some vertigo and nystagmus with R superior/lateral R eye movement that improved with habituation activities and is worse in position for Epley most likely due to heightened sensitivity in this position. Daughter and son present at end of session. Due to pt current functional status, home set up and available assistance at home recommending skilled physical therapy services in order to address strength, balance and functional mobility to decrease risk for falls, injury and re-hospitalization. Family is requesting skilled nursing facility.           If plan is discharge home, recommend the following: Assist for transportation;Help with stairs or ramp for entrance;Assistance with cooking/housework     Equipment Recommendations None recommended by PT     Functional Status Assessment Patient has had a recent decline in their functional status and demonstrates the ability to make significant improvements in function in a reasonable and predictable amount of time.     Precautions / Restrictions Precautions Precautions: Fall Recall of Precautions/Restrictions: Intact Restrictions Weight Bearing Restrictions Per Provider Order: No      Mobility  Bed Mobility Overal bed mobility: Needs  Assistance         General bed mobility comments: EOB on arrival, moving into long sit for Epley as part of PT eval with min A for guidance. general bed mobility not assessed    Transfers Overall transfer level: Needs assistance Equipment used: None Transfers: Sit to/from Stand Sit to Stand: Supervision           General transfer comment: for safety    Ambulation/Gait Ambulation/Gait assistance: Supervision Gait Distance (Feet): 40 Feet Assistive device: IV Pole, None Gait Pattern/deviations: Step-through pattern, Decreased stride length Gait velocity: decreased Gait velocity interpretation: <1.31 ft/sec, indicative of household ambulator   General Gait Details: minimal heel toe gait pattern with minimal arm swing. Pt with guarded movements with gait.      Balance Overall balance assessment: Needs assistance Sitting-balance support: No upper extremity supported, Feet supported Sitting balance-Leahy Scale: Good     Standing balance support: No upper extremity supported, During functional activity, Single extremity supported Standing balance-Leahy Scale: Fair Standing balance comment: mildly unsteady gait         Pertinent Vitals/Pain Pain Assessment Pain Assessment: Faces Faces Pain Scale: Hurts little more Pain Location: back, neck Pain Descriptors / Indicators: Discomfort, Guarding Pain Intervention(s): Limited activity within patient's tolerance, Monitored during session, Repositioned    Home Living Family/patient expects to be discharged to:: Private residence Living Arrangements: Alone Available Help at Discharge: Family;Available PRN/intermittently Type of Home: House Home Access: Stairs to enter Entrance Stairs-Rails: Left Entrance Stairs-Number of Steps: 7   Home Layout: One level Home Equipment: Agricultural Consultant (2 wheels);Cane - single point;Rollator (4 wheels);Grab bars - tub/shower;Grab bars - toilet (toilet rise vs BSC, pt did not clarify)  Prior Function Prior Level of Function : Independent/Modified Independent;Driving;History of Falls (last six months) (pt reports 17 falls in past 7 months)       ADLs Comments: mod I for BADL     Extremity/Trunk Assessment   Upper Extremity Assessment Upper Extremity Assessment: Defer to OT evaluation RUE Deficits / Details: pt reports recent qrist fx, brace on during session, encouraged pt not to weightbear as fx 1 week ago per her report. using functionally throughout session    Lower Extremity Assessment Lower Extremity Assessment: Generalized weakness    Cervical / Trunk Assessment Cervical / Trunk Assessment: Normal  Communication   Communication Communication: No apparent difficulties    Cognition Arousal: Alert Behavior During Therapy: WFL for tasks assessed/performed   PT - Cognitive impairments: No apparent impairments     Following commands: Intact       Cueing Cueing Techniques: Verbal cues     General Comments General comments (skin integrity, edema, etc.): Pt currently on antivert  per nursing. Pt reports no dizziness with bed mobility/sit to stand. pt reports dizziness wtih visual tracking in the R superior visual quadrant and subsequent diagonal movement. Pt positive for torsional, ageotropic nystagmus with R Epley which improved on second attempt. Slow less than 30 second resolution wtih more than one burst each testing for 2x most likely due to thickened endolymph related to dehydration. pt was given diagonal tracking for 30 seconds intervals 3x every 1-2 hours while awake and to lay with [facility] elevated 45 degrees or with 2 pillows for 2 nights while avoiding laying on the R side.        Assessment/Plan    PT Assessment Patient needs continued PT services  PT Problem List Decreased balance;Decreased mobility       PT Treatment Interventions DME instruction;Functional mobility training;Gait training;Stair training;Therapeutic exercise;Therapeutic  activities;Neuromuscular re-education;Balance training;Canalith reposition;Patient/family education;Manual techniques    PT Goals (Current goals can be found in the Care Plan section)  Acute Rehab PT Goals Patient Stated Goal: to improve dizziness to stop falling PT Goal Formulation: With patient Time For Goal Achievement: 01/25/24 Potential to Achieve Goals: Fair Additional Goals Additional Goal #1: Will have negative Epley on the R and be able to look to the R superior visual quadrant without dizziness.    Frequency Min 2X/week        AM-PAC PT 6 Clicks Mobility  Outcome Measure Help needed turning from your back to your side while in a flat bed without using bedrails?: A Little Help needed moving from lying on your back to sitting on the side of a flat bed without using bedrails?: A Little Help needed moving to and from a bed to a chair (including a wheelchair)?: A Little Help needed standing up from a chair using your arms (e.g., wheelchair or bedside chair)?: A Little Help needed to walk in hospital room?: A Little Help needed climbing 3-5 steps with a railing? : A Little 6 Click Score: 18    End of Session Equipment Utilized During Treatment: Gait belt Activity Tolerance: Patient tolerated treatment well Patient left: in bed;with family/visitor present;Other (comment) (no call bell in ED) Nurse Communication: Mobility status PT Visit Diagnosis: Dizziness and giddiness (R42);Unsteadiness on feet (R26.81)    Time: 8697-8597 PT Time Calculation (min) (ACUTE ONLY): 60 min   Charges:   PT Evaluation $PT Eval Low Complexity: 1 Low PT Treatments $Therapeutic Activity: 8-22 mins $Canalith Rep Proc: 8-22 mins PT General Charges $$ ACUTE PT VISIT: 1 Visit  Dorothyann Maier, DPT, CLT  Acute Rehabilitation Services Office: [phone] (Secure chat preferred)   Dorothyann VEAR Maier 01/11/2024, 4:04 PM

## 2024-01-11 NOTE — Assessment & Plan Note (Addendum)
 Suspect BPPV, localized to the right side --Continue vestibular physical therapy --Continue PT/OT at SNF --PRN meclizine 

## 2024-01-11 NOTE — Assessment & Plan Note (Addendum)
 PDMP appropriate with active prescription for hydrocodone -acetaminophen  5-325 mg p.o. written on 10/20, filled on 10/28 for 30-day supply --Continue home regimen

## 2024-01-11 NOTE — [place] FL2 (Signed)
 [place]  MEDICAID FL2 LEVEL OF CARE FORM     IDENTIFICATION  Patient Name: Tonya Bartlett Birthdate: 23-May-1945 Sex: female Admission Date (Current Location): 01/10/2024  [facility] and [place] Number:  Producer, Television/film/video and Address:  [facility], [address] [place], [place] [address]      Provider Number: [id number]  Attending Physician Name and Address:  Sherre Greig SAILOR, DO  Relative Name and Phone Number:  Devere Dar (daughter) [phone]    Current Level of Care: Hospital Recommended Level of Care: Skilled Nursing Facility Prior Approval Number:    Date Approved/Denied:   PASRR Number: [id number] (Simultaneous filing. User may not have seen previous data.)  Discharge Plan: SNF    Current Diagnoses: Patient Active Problem List   Diagnosis Date Noted   AKI (acute kidney injury) 01/11/2024   Recurrent falls 01/11/2024   Vertigo 01/11/2024   GAD (generalized anxiety disorder) 01/11/2024   Chronic pain syndrome 01/11/2024   Lumbar radiculopathy, chronic 01/11/2024   Closed fracture of cervical vertebral body (HCC) 04/15/2023   Closed compression fracture of body of L1 vertebra (HCC) 04/15/2023   Vertebral compression fracture (HCC) 04/14/2023   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   Hypercoagulable state due to paroxysmal atrial fibrillation (HCC) 12/10/2022   BMI 33.0-33.9,adult 07/22/2022   BMI 32.0-32.9,adult 04/09/2022   Obesity, Beginning BMI 36.94 04/09/2022   B12 deficiency 01/29/2022   Pain of toe of right foot 12/25/2021   Pre-diabetes 11/05/2021   Essential hypertension 11/05/2021   Left knee pain 11/05/2021   Coronary artery calcification 08/07/2021   Vitamin D  deficiency 07/29/2021   Insulin  resistance 07/17/2021   Dysphagia 03/21/2021   Gastroesophageal reflux disease 03/21/2021   History of colon polyps 03/21/2021   Genetic testing 01/31/2021   Aortic atherosclerosis 01/17/2021   Family history of melanoma  01/16/2021   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   PAF (paroxysmal atrial fibrillation) (HCC) 10/22/2020   Family history of heart disease 10/22/2020   Trimalleolar fracture 07/31/2020   Right hip OA 08/01/2019   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   Insomnia 02/03/2019   Onychomycosis 11/28/2018   Obese 05/05/2017   S/P left THA, 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   MCI (mild cognitive impairment) 09/19/2015   Memory deficit 09/19/2015   SIRS (systemic inflammatory response syndrome) (HCC) 10/29/2014   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   Depression 11/16/2013   ADD (attention deficit disorder) 11/17/2011   Essential hypertension, benign 10/15/2009   SEBORRHEIC KERATOSIS, INFLAMED 10/17/2008   CARPAL TUNNEL SYNDROME, LEFT 06/22/2008   Allergic rhinitis 08/30/2007   Hyperlipidemia 11/11/2006   Migraine 11/11/2006   Osteoarthritis 11/11/2006    Orientation RESPIRATION BLADDER Height & Weight     Self, Time, Situation, Place  Normal Incontinent Weight: 202 lb (91.6 kg) Height:  5' 5 (165.1 cm)  BEHAVIORAL SYMPTOMS/MOOD NEUROLOGICAL BOWEL NUTRITION STATUS      Continent Diet (see dc summary)  AMBULATORY STATUS COMMUNICATION OF NEEDS Skin   Extensive Assist (3 falls in past week. Uses walker.) Verbally Bruising (bruise to left thigh; fracture to right wrist, skin intact, splint in place.)                       Personal Care Assistance Level of Assistance  Bathing, Feeding, Dressing Bathing Assistance: Limited assistance Feeding assistance: Independent Dressing Assistance: Limited assistance     Functional Limitations Info  Sight, Hearing, Speech Sight Info: Adequate Hearing Info:  Adequate Speech Info: Adequate    SPECIAL CARE FACTORS FREQUENCY  PT (By licensed PT), OT (By licensed OT)     PT Frequency: 5x weekly OT Frequency: 5x weekly            Contractures Contractures Info: Not present    Additional Factors Info  Code  Status, Allergies Code Status Info: Full Allergies Info: Cuprimine (Penicillamine), Nsaids, Otezla (Apremilast), Penicillins, Erythromycin, Morphine And Codeine, Remicade  (Infliximab ), Skyrizi (Risankizumab), Tolectin (Tolmetin), Nickel           Current Medications (01/11/2024):  This is the current hospital active medication list Current Facility-Administered Medications  Medication Dose Route Frequency Provider Last Rate Last Admin   0.9 %  sodium chloride  infusion  250 mL Intravenous PRN Sundil, Subrina, MD       acetaminophen  (TYLENOL ) tablet 650 mg  650 mg Oral Q6H PRN Sundil, Subrina, MD       Or   acetaminophen  (TYLENOL ) suppository 650 mg  650 mg Rectal Q6H PRN Sundil, Subrina, MD       albuterol  (PROVENTIL ) (2.5 MG/3ML) 0.083% nebulizer solution 3 mL  3 mL Inhalation Q6H PRN Sundil, Subrina, MD       atorvastatin  (LIPITOR) tablet 40 mg  40 mg Oral Daily Griselda Norris, MD   40 mg at 01/11/24 9177   buPROPion  (WELLBUTRIN  SR) 12 hr tablet 150 mg  150 mg Oral Daily Griselda Norris, MD   150 mg at 01/11/24 9177   hydrALAZINE  (APRESOLINE ) injection 5 mg  5 mg Intravenous Q6H PRN Cox, Amy N, DO       HYDROcodone -acetaminophen  (NORCO/VICODIN) 5-325 MG per tablet 1 tablet  1 tablet Oral Q4H PRN Griselda Norris, MD       lactated ringers  infusion   Intravenous Continuous Sundil, Subrina, MD 75 mL/hr at 01/11/24 1251 Rate Verify at 01/11/24 1251   meclizine  (ANTIVERT ) tablet 12.5 mg  12.5 mg Oral TID Sundil, Subrina, MD   12.5 mg at 01/11/24 9177   metoprolol  tartrate (LOPRESSOR ) tablet 25 mg  25 mg Oral BID Sundil, Subrina, MD   25 mg at 01/11/24 9178   ondansetron  (ZOFRAN ) tablet 4 mg  4 mg Oral Q6H PRN Sundil, Subrina, MD       Or   ondansetron  (ZOFRAN ) injection 4 mg  4 mg Intravenous Q6H PRN Sundil, Subrina, MD       pantoprazole  (PROTONIX ) EC tablet 40 mg  40 mg Oral Daily Sundil, Subrina, MD   40 mg at 01/11/24 9178   sodium chloride  flush (NS) 0.9 % injection 3 mL  3 mL Intravenous  Q12H Sundil, Subrina, MD   3 mL at 01/11/24 0205   sodium chloride  flush (NS) 0.9 % injection 3 mL  3 mL Intravenous Q12H Sundil, Subrina, MD   3 mL at 01/11/24 0205   sodium chloride  flush (NS) 0.9 % injection 3 mL  3 mL Intravenous PRN Sundil, Subrina, MD       traZODone  (DESYREL ) tablet 50 mg  50 mg Oral QHS PRN Sundil, Subrina, MD       venlafaxine  XR (EFFEXOR -XR) 24 hr capsule 150 mg  150 mg Oral Daily Sundil, Subrina, MD   150 mg at 01/11/24 9176   Current Outpatient Medications  Medication Sig Dispense Refill   acetaminophen  (TYLENOL ) 325 MG tablet Take 2 tablets (650 mg total) by mouth every 4 (four) hours as needed for mild pain (pain score 1-3) (or Fever >/= 101).     albuterol  (VENTOLIN  HFA) 108 (90 Base)  MCG/ACT inhaler INHALE 2 PUFFS INTO THE LUNGS EVERY 6 (SIX) HOURS AS NEEDED FOR WHEEZING OR SHORTNESS OF BREATH (COUGH ). START WITH USING IN AM, AROUND NOON AND 1 HOUR BEFORE BEDTIME TO HELP COUGH SYMPTOMS. 8.5 each 1   atorvastatin  (LIPITOR) 40 MG tablet TAKE 1 TABLET BY MOUTH EVERY DAY 90 tablet 1   buPROPion  ER (WELLBUTRIN  SR) 150 MG 12 hr tablet Take 1 tablet (150 mg total) by mouth daily. 30 tablet 0   cetirizine (ZYRTEC) 10 MG tablet Take 10 mg by mouth daily.     cyclobenzaprine  (FLEXERIL ) 5 MG tablet Take 1 tablet (5 mg total) by mouth 3 (three) times daily as needed for muscle spasms. 30 tablet 0   HYDROcodone -acetaminophen  (NORCO/VICODIN) 5-325 MG tablet Take 2 tablets by mouth every 4 (four) hours as needed for moderate pain (pain score 4-6) or severe pain (pain score 7-10). (Patient taking differently: Take 1 tablet by mouth every 4 (four) hours as needed for moderate pain (pain score 4-6) or severe pain (pain score 7-10).) 30 tablet 0   losartan  (COZAAR ) 100 MG tablet TAKE 1 TABLET BY MOUTH EVERY DAY 90 tablet 3   metoprolol  tartrate (LOPRESSOR ) 25 MG tablet TAKE 1 TABLET BY MOUTH TWICE A DAY 180 tablet 3   omeprazole  (PRILOSEC) 40 MG capsule Take 1 capsule (40 mg total) by  mouth 2 (two) times daily. 180 capsule 3   sulfamethoxazole -trimethoprim  (BACTRIM ,SEPTRA ) 400-80 MG tablet Take 1 tablet by mouth at bedtime.     SUMAtriptan  (IMITREX ) 100 MG tablet TAKE 1 TABLET (100 MG TOTAL) BY MOUTH EVERY 2 (TWO) HOURS AS NEEDED FOR MIGRAINE. MAY REPEAT IN 2 HOURS IF HEADACHE PERSISTS OR RECURS. 10 tablet 2   traZODone  (DESYREL ) 50 MG tablet TAKE 1 TABLET BY MOUTH EVERYDAY AT BEDTIME 90 tablet 1   venlafaxine  XR (EFFEXOR -XR) 150 MG 24 hr capsule Take 1 capsule (150 mg total) by mouth daily. 90 capsule 0     Discharge Medications: Please see discharge summary for a list of discharge medications.  Relevant Imaging Results:  Relevant Lab Results:   Additional Information SSN: [id number]  Sheri ONEIDA Sharps, LCSW

## 2024-01-12 ENCOUNTER — Ambulatory Visit: Admitting: Physical Therapy

## 2024-01-12 DIAGNOSIS — N179 Acute kidney failure, unspecified: Secondary | ICD-10-CM

## 2024-01-12 LAB — BASIC METABOLIC PANEL WITH GFR
Anion gap: 8 (ref 5–15)
BUN: 10 mg/dL (ref 8–23)
CO2: 24 mmol/L (ref 22–32)
Calcium: 8.9 mg/dL (ref 8.9–10.3)
Chloride: 107 mmol/L (ref 98–111)
Creatinine, Ser: 0.81 mg/dL (ref 0.44–1.00)
GFR, Estimated: >60 mL/min (ref >60)
Glucose, Bld: 106 mg/dL — ABNORMAL HIGH (ref 70–99)
Potassium: 3.6 mmol/L (ref 3.5–5.1)
Sodium: 139 mmol/L (ref 135–145)

## 2024-01-12 LAB — CBC
HCT: 29.7 % — ABNORMAL LOW (ref 36.0–46.0)
Hemoglobin: 9.9 g/dL — ABNORMAL LOW (ref 12.0–15.0)
MCH: 33.1 pg (ref 26.0–34.0)
MCHC: 33.3 g/dL (ref 30.0–36.0)
MCV: 99.3 fL (ref 80.0–100.0)
Platelets: 275 K/uL (ref 150–400)
RBC: 2.99 MIL/uL — ABNORMAL LOW (ref 3.87–5.11)
RDW: 12.7 % (ref 11.5–15.5)
WBC: 6.1 K/uL (ref 4.0–10.5)
nRBC: 0 % (ref 0.0–0.2)

## 2024-01-12 MED ORDER — MECLIZINE HCL 12.5 MG PO TABS: 12.5000 mg | ORAL_TABLET | Freq: Three times a day (TID) | ORAL | 0 refills | Status: DC

## 2024-01-12 NOTE — Plan of Care (Signed)
   Problem: Activity: Goal: Risk for activity intolerance will decrease Outcome: Progressing   Problem: Elimination: Goal: Will not experience complications related to bowel motility Outcome: Progressing Goal: Will not experience complications related to urinary retention Outcome: Progressing   Problem: Safety: Goal: Ability to remain free from injury will improve Outcome: Progressing   Problem: Skin Integrity: Goal: Risk for impaired skin integrity will decrease Outcome: Progressing

## 2024-01-12 NOTE — TOC Progression Note (Signed)
 Transition of Care [facility]) - Progression Note    Patient Details  Name: Tonya Bartlett MRN: [id number] Date of Birth: 1945/05/14  [facility] CM/SW Contact  Sherline Clack, [place] Phone Number: 01/12/2024, 5:40 PM  Clinical Narrative:     Patient requested to talk to CSW about SNF placement. Patient and her son requested patient is discharged to SNF, not home. Patient and son say patient will not be able to take care of herself at home and family members are not able to come in to stay with patient at home. CSW messaged provider and discharge order was removed. CSW got in contact with [facility] and will follow up for discharge. CSW will continue to follow.                     Expected Discharge Plan and Services         Expected Discharge Date: 01/12/24                                     Social Drivers of Health (SDOH) Interventions SDOH Screenings   Food Insecurity: No Food Insecurity (01/11/2024)  Housing: High Risk (01/11/2024)  Transportation Needs: No Transportation Needs (01/11/2024)  Utilities: Not At Risk (01/11/2024)  Alcohol Screen: Low Risk  (04/18/2021)  Depression (PHQ2-9): Medium Risk (03/26/2023)  Financial Resource Strain: Low Risk  (04/18/2021)  Physical Activity: Inactive (04/18/2021)  Social Connections: Moderately Integrated (01/11/2024)  Stress: No Stress Concern Present (04/18/2021)  Tobacco Use: Medium Risk (01/11/2024)    Readmission Risk Interventions    01/12/2024   12:41 PM  Readmission Risk Prevention Plan  Post Dischage Appt Complete  Medication Screening Complete  Transportation Screening Complete

## 2024-01-12 NOTE — Progress Notes (Signed)
 PROGRESS NOTE  Tonya Bartlett  FMW:[id number] DOB: 1945/09/06 DOA: 01/10/2024 PCP: Merna Huxley, NP   78 year old female past medical history of hypertension GERD, migraine, generalized anxiety disorder, chronic pain syndrome, insomnia, and hyperlipidemia presented to emergency department valuation for multiple fall in last 1 week.  Patient reported it has been getting worse for past few days.  Patient hit her head multiple times when falling.  Denies any recent head trauma.  Reported having disequilibrium and left-sided lower extremity weakness that is causing recurrent fall. Patient reported she has history of lumbar radiculopathy and has been seen by neurosurgery in the past however it has been recently getting worse.  Complaining about back pain and it is getting worse since multiple fall.  At presentation to ED patient is hemodynamically stable. Lab, CBC unremarkable.  CMP showed low bicarb 21, elevated creatinine 1.21, low GFR 46.  Normal ammonia level.  CBC unremarkable normal WBC count, stable H&H normal platelet count.  UA evidence of UTI.  Normal ammonia level.  MRI of the brain no evidence of acute intracranial abnormality.  Chronic microvascular ischemic changes.  MRI lumbar spine redemonstrate compression fracture L1-L2.  In the ED patient received 1 L of NS bolus.  Hospitalist consulted for further evaluation management of recurrent fall and AKI.  11/25: assumed care of patient. AKI nearly resolved and anticipate complete resolution tomorrow.  She had vestibular physical therapy today and likely will need 2-3 more sessions pending their recommendation.  Continue precaution.  PT, OT, vestibular PT, TOC consulted.  Assessment & Plan:   Principal Problem:   AKI (acute kidney injury) Active Problems:   Vertigo   Hyperlipidemia   Essential hypertension   Recurrent falls   GAD (generalized anxiety disorder)   Chronic pain syndrome   Lumbar radiculopathy, chronic    Assessment and Plan:  * AKI (acute kidney injury) AKI has resolved with IV fluids. Cr 0.81. Hypokalemia-resolved with replacement yesterday 11/25. K 3.6 today Completed IV fluids --Monitor off IV fluids --Monitor BMP  Vertigo Suspect BPPV, localized to the right side --Continue vestibular physical therapy --Continue PT/OT - with HH versus SNF --Repeat PT assessment today pending --Fall precaution  Lumbar radiculopathy, chronic Followed by Neurosurgery. No acute issues. PT/OT  Chronic pain syndrome PDMP appropriate with active prescription for hydrocodone -acetaminophen  5-325 mg p.o. written on 10/20, filled on 10/28 for 30-day supply  GAD (generalized anxiety disorder) --Continue home regimen: bupropion  100 mg daily, venlafaxine  150 mg daily  Recurrent falls Fall precautions PT/OT evaluations Treat for BPPV  Essential hypertension BP labile systolic ranging 879 to 150's --Continue metoprolol  tartrate 25 mg p.o. twice daily resumed --Hydralazine  5 mg IV PRN  Hyperlipidemia --Continue atorvastatin  40 mg daily  DVT prophylaxis: Enoxaparin  Code Status: Full code Family Communication: No Disposition Plan: Pending clinical course Level of care: Telemetry  Consultants:  PT, OT, vestibular physical therapy, TOC  Procedures:  Epley maneuver  Antimicrobials: None indicated  Subjective:  Pt awake resting in bed when seen this AM. She reports feeling well.  Had to get up to bedside commode on her own when staff did not come to help her.  Otherwise has not been up to see how she feels ambulating today.  Hopes to go home today, but wanting to work with PT again.   No major vertigo symptoms at rest today.   Objective: Vitals:   01/12/24 0056 01/12/24 0428 01/12/24 1216 01/12/24 1654  BP: (!) 157/82 120/78 (!) 182/95 (!) 158/84  Pulse: 78 74 68 76  Resp: 17 16    Temp: 97.8 F (36.6 C) 98.6 F (37 C) 98.3 F (36.8 C) 98.2 F (36.8 C)  TempSrc:  Oral Oral Oral   SpO2: 95% 98% 97% 97%  Weight:      Height:        Intake/Output Summary (Last 24 hours) at 01/12/2024 1702 Last data filed at 01/11/2024 1800 Gross per 24 hour  Intake 1000 ml  Output --  Net 1000 ml   Filed Weights   01/10/24 1223  Weight: 91.6 kg   Examination:  General exam: awake, alert, no acute distress HEENT: atraumatic, clear conjunctiva, anicteric sclera, moist mucus membranes, hearing grossly normal  Respiratory system: CTAB, no wheezes, rales or rhonchi, normal respiratory effort. Cardiovascular system: normal S1/S2, RRR, no JVD, murmurs, rubs, gallops, no pedal edema.   Gastrointestinal system: soft, NT, ND, no HSM felt, +bowel sounds. Central nervous system: A&O x 4. no gross focal neurologic deficits, normal speech Extremities: moves all, no edema, normal tone Skin: dry, intact, normal temperature Psychiatry: normal mood, congruent affect, judgement and insight appear normal   Data Reviewed: I have personally reviewed following labs and imaging studies  CBC: Recent Labs  Lab 01/10/24 1331 01/11/24 0500 01/12/24 0356  WBC 7.1 5.3 6.1  HGB 11.6* 9.8* 9.9*  HCT 34.7* 29.7* 29.7*  MCV 99.7 99.7 99.3  PLT 334 284 275   Basic Metabolic Panel: Recent Labs  Lab 01/10/24 1331 01/11/24 0500 01/12/24 0356  NA 137 140 139  K 3.6 3.2* 3.6  CL 104 106 107  CO2 21* 22 24  GLUCOSE 95 160* 106*  BUN 24* 17 10  CREATININE 1.21* 1.02* 0.81  CALCIUM  8.9 8.5* 8.9  MG  --  2.1  --    GFR: Estimated Creatinine Clearance: 62.9 mL/min (by C-G formula based on SCr of 0.81 mg/dL).  Liver Function Tests: Recent Labs  Lab 01/10/24 1331 01/11/24 0500  AST 36 26  ALT 27 19  ALKPHOS 72 62  BILITOT 0.8 0.6  PROT 6.9 5.7*  ALBUMIN 3.2* 2.8*   Recent Labs  Lab 01/10/24 1612  AMMONIA <13   Cardiac Enzymes: Recent Labs  Lab 01/11/24 0505  CKTOTAL 98   BNP (last 3 results) Recent Labs    01/20/23 1059  PROBNP 219   Radiology Studies: MR Brain W  and Wo Contrast Result Date: 01/10/2024 EXAM: MRI BRAIN WITH AND WITHOUT CONTRAST 01/10/2024 06:37:00 PM TECHNIQUE: Multiplanar multisequence MRI of the head/brain was performed with and without the administration of 9 mL (gadobutrol  (GADAVIST ) 1 MMOL/ML injection 9 mL GADOBUTROL  1 MMOL/ML IV SOLN) intravenous contrast. COMPARISON: CT head 09/22/2023 and MRI head 09/20/2023. CLINICAL HISTORY: Mental status change, unknown cause. FINDINGS: BRAIN AND VENTRICLES: No acute infarct. No acute intracranial hemorrhage. No mass effect or midline shift. No hydrocephalus. The sella is unremarkable. Normal flow voids. No mass or abnormal enhancement. T2 and FLAIR hyperintensity in the periventricular and subcortical white matter similar to prior and suggestive of mild chronic microvascular ischemic changes. Similar related signal abnormality in the pons. Mild generalized parenchymal volume loss. Multiple sequences are degraded by motion artifact as well as artifact from dental hardware. ORBITS: Bilateral lens replacement. No acute abnormality. SINUSES: No acute abnormality. BONES AND SOFT TISSUES: Normal bone marrow signal and enhancement. No acute soft tissue abnormality. IMPRESSION: 1. No acute intracranial abnormality. 2. Mild chronic microvascular ischemic changes and mild generalized parenchymal volume loss. Electronically signed by: Donnice Mania MD 01/10/2024 07:51 PM EST RP Workstation: [id number]  MR LUMBAR SPINE WO CONTRAST Result Date: 01/10/2024 CLINICAL DATA:  Lumbar radiculopathy, trauma. EXAM: MRI LUMBAR SPINE WITHOUT CONTRAST TECHNIQUE: Multiplanar, multisequence MR imaging of the lumbar spine was performed. No intravenous contrast was administered. COMPARISON:  MRI lumbar spine 11/12/2023. FINDINGS: Segmentation: The lowest well-formed disc space is labeled L5-S1. This disc space demonstrates moderate disc height loss related to degenerative changes, similar to prior. There is a rudimentary disc space at  the S1-2 level. Numbering is concordant with the prior studies. Alignment: Lumbar lordosis is maintained. Similar grade 1 anterolisthesis of L4 on L5. Vertebrae: Redemonstrated compression fracture of L1 with proximally 80% height loss centrally overall similar to prior approximately 4-5 mm retropulsion at the inferior endplate of L1 is also similar to prior. Additional compression fracture of L2 which demonstrates up to 60% height loss, slighlty increased along the anterior aspect compared to prior. Schmorl's nodes at multiple levels. Vertebral body heights are otherwise maintained. Persistent edema within the L1 vertebral body which may be related to micro motion at the fracture site. There is slightly increased edema within the anterior aspect of the L2 vertebral body with slightly increased height loss anteriorly. No evidence of additional acute fracture. Conus medullaris and cauda equina: Conus extends to the L2 level. Conus and cauda equina appear normal. Paraspinal and other soft tissues: Mild paraspinal muscular atrophy. Similar appearance of bilateral renal cysts. Disc levels: T12-L1: Small disc bulge indents the ventral thecal sac without evidence of significant spinal canal stenosis on sagittal images. Bilateral facet arthrosis. Mild foraminal stenosis on the left. L1-2: Retropulsion of fracture fragments at L1 along with disc bulge which indents the ventral thecal sac. Bilateral facet arthrosis indents the dorsal thecal sac. There is moderate to severe spinal canal stenosis which appears similar to prior. Similar compression of the conus medullaris and crowding of the cauda equina nerve roots. There is mild bilateral foraminal stenosis with partial contribution from retropulsion of fracture fragments overall similar to prior. L2-3: Small disc bulge moderate facet arthrosis. Similar mild spinal canal stenosis. There is mild bilateral neural foraminal stenosis. L3-4: Small disc bulge. Moderate bilateral  facet arthrosis and thickening of the ligamentum flavum. No significant spinal canal stenosis. No significant foraminal stenosis. L4-5: Partial uncovering of the disc. Small disc bulge. Mild lateral recess narrowing. Moderate to severe facet arthrosis. Mild thickening of the ligamentum flavum. There is no significant spinal canal stenosis. No significant foraminal stenosis. L5-S1: Moderate disc height loss. Small right paracentral disc protrusion. Moderate bilateral facet arthrosis. Thickening of the ligamentum flavum. No significant spinal canal stenosis. There is mild foraminal stenosis on the left. IMPRESSION: Redemonstrated compression fractures of L1 and L2. Persistent edema at the L1 level may be related to micro-motion at the fracture site. Additionally there is increased edema anteriorly within the L2 vertebral body with increased height loss of L2 anteriorly. Degenerative changes as above overall similar to prior. Similar moderate to severe spinal canal stenosis at L1-2 secondary to retropulsion of fracture fragments and disc bulge. Similar mild compression of the conus medullaris. Electronically Signed   By: Donnice Mania M.D.   On: 01/10/2024 19:12   Scheduled Meds:  atorvastatin   40 mg Oral Daily   buPROPion   150 mg Oral Daily   enoxaparin  (LOVENOX ) injection  40 mg Subcutaneous QHS   meclizine   12.5 mg Oral TID   metoprolol  tartrate  25 mg Oral BID   pantoprazole   40 mg Oral Daily   sodium chloride  flush  3 mL Intravenous Q12H  sodium chloride  flush  3 mL Intravenous Q12H   venlafaxine  XR  150 mg Oral Daily   Continuous Infusions:    LOS: 1 day   Time spent: 45 minutes  Dr. Burnard Cunning, DO [facility]   If 7PM-7AM, please contact night-coverage 01/12/2024, 5:02 PM

## 2024-01-12 NOTE — Discharge Summary (Incomplete)
 Physician Discharge Summary   Patient: Tonya Bartlett MRN: [id number] DOB: 11/02/1945  Admit date:     01/10/2024  Discharge date: {dischdate:26783}  Discharge Physician: Burnard DELENA Cunning   PCP: Merna Huxley, NP   Recommendations at discharge:  {Tip this will not be part of the note when signed- Example include specific recommendations for outpatient follow-up, pending tests to follow-up on. (Optional):26781}  ***  Discharge Diagnoses: Principal Problem:   AKI (acute kidney injury) Active Problems:   Vertigo   Hyperlipidemia   Essential hypertension   Recurrent falls   GAD (generalized anxiety disorder)   Chronic pain syndrome   Lumbar radiculopathy, chronic  Resolved Problems:   Family history of GERD  Hospital Course: 78 year old female past medical history of hypertension GERD, migraine, generalized anxiety disorder, chronic pain syndrome, insomnia, and hyperlipidemia presented to emergency department valuation for multiple fall in last 1 week.  Patient reported it has been getting worse for past few days.  Patient hit her head multiple times when falling.  Denies any recent head trauma.  Reported having disequilibrium and left-sided lower extremity weakness that is causing recurrent fall. Patient reported she has history of lumbar radiculopathy and has been seen by neurosurgery in the past however it has been recently getting worse.  Complaining about back pain and it is getting worse since multiple fall.  At presentation to ED patient is hemodynamically stable. Lab, CBC unremarkable.  CMP showed low bicarb 21, elevated creatinine 1.21, low GFR 46.  Normal ammonia level.  CBC unremarkable normal WBC count, stable H&H normal platelet count.  UA evidence of UTI.  Normal ammonia level.  MRI of the brain no evidence of acute intracranial abnormality.  Chronic microvascular ischemic changes.  MRI lumbar spine redemonstrate compression fracture L1-L2.  In the ED patient  received 1 L of NS bolus.  Hospitalist consulted for further evaluation management of recurrent fall and AKI.  11/25: assumed care of patient. AKI nearly resolved and anticipate complete resolution tomorrow.  She had vestibular physical therapy today and likely will need 2-3 more sessions pending their recommendation.  Continue precaution.  PT, OT, vestibular PT, TOC consulted.  Assessment and Plan: * AKI (acute kidney injury) AKI has resolved with IV fluids. Cr 0.81. Hypokalemia-resolved with replacement yesterday 11/25. K 3.6 today Completed IV fluids --Monitor off IV fluids --Monitor BMP  Vertigo Suspect BPPV, localized to the right side --Continue vestibular physical therapy --Continue PT/OT - with HH versus SNF --Repeat PT assessment today pending --Fall precaution  Lumbar radiculopathy, chronic Followed by Neurosurgery. No acute issues. PT/OT  Chronic pain syndrome PDMP appropriate with active prescription for hydrocodone -acetaminophen  5-325 mg p.o. written on 10/20, filled on 10/28 for 30-day supply  GAD (generalized anxiety disorder) --Continue home regimen: bupropion  100 mg daily, venlafaxine  150 mg daily  Recurrent falls Fall precautions PT/OT evaluations Treat for BPPV  Essential hypertension BP labile systolic ranging 879 to 150's --Continue metoprolol  tartrate 25 mg p.o. twice daily resumed --Hydralazine  5 mg IV PRN  Hyperlipidemia --Continue atorvastatin  40 mg daily      {Tip this will not be part of the note when signed Body mass index is 33.61 kg/m. , ,  (Optional):26781}  {(NOTE) Pain control PDMP Statment (Optional):26782} Consultants: *** Procedures performed: ***  Disposition: {Plan; Disposition:26390} Diet recommendation:  Discharge Diet Orders (From admission, onward)     Start     Ordered   01/12/24 0000  Diet - low sodium heart healthy  01/12/24 1706           {Diet_Plan:26776} DISCHARGE MEDICATION: Allergies as of  01/12/2024       Reactions   Cuprimine [penicillamine] Anaphylaxis   Nsaids Anaphylaxis, Other (See Comments)   Severe stomach cramping Mouth sores   Otezla [apremilast] Anaphylaxis   Suicidal ideation   Penicillins Anaphylaxis   Erythromycin Other (See Comments)   SEVERE STOMACH CRAMPS   Morphine And Codeine Nausea And Vomiting   Remicade  [infliximab ] Other (See Comments), Hypertension   Shut down immune system   Skyrizi [risankizumab] Other (See Comments), Cough   Upper respiratory infection Urinary tract infection Fever   Tolectin [tolmetin] Other (See Comments)   Severe stomach cramping   Nickel Rash   Including snaps on hospital gowns         Medication List     TAKE these medications    acetaminophen  325 MG tablet Commonly known as: TYLENOL  Take 2 tablets (650 mg total) by mouth every 4 (four) hours as needed for mild pain (pain score 1-3) (or Fever >/= 101).   albuterol  108 (90 Base) MCG/ACT inhaler Commonly known as: VENTOLIN  HFA INHALE 2 PUFFS INTO THE LUNGS EVERY 6 (SIX) HOURS AS NEEDED FOR WHEEZING OR SHORTNESS OF BREATH (COUGH ). START WITH USING IN AM, AROUND NOON AND 1 HOUR BEFORE BEDTIME TO HELP COUGH SYMPTOMS.   atorvastatin  40 MG tablet Commonly known as: LIPITOR TAKE 1 TABLET BY MOUTH EVERY DAY   buPROPion  150 MG 12 hr tablet Commonly known as: WELLBUTRIN  SR Take 1 tablet (150 mg total) by mouth daily.   cetirizine 10 MG tablet Commonly known as: ZYRTEC Take 10 mg by mouth daily.   cyclobenzaprine  5 MG tablet Commonly known as: FLEXERIL  Take 1 tablet (5 mg total) by mouth 3 (three) times daily as needed for muscle spasms.   HYDROcodone -acetaminophen  5-325 MG tablet Commonly known as: NORCO/VICODIN Take 2 tablets by mouth every 4 (four) hours as needed for moderate pain (pain score 4-6) or severe pain (pain score 7-10). What changed: how much to take   losartan  100 MG tablet Commonly known as: COZAAR  TAKE 1 TABLET BY MOUTH EVERY DAY    meclizine  12.5 MG tablet Commonly known as: ANTIVERT  Take 1 tablet (12.5 mg total) by mouth 3 (three) times daily.   metoprolol  tartrate 25 MG tablet Commonly known as: LOPRESSOR  TAKE 1 TABLET BY MOUTH TWICE A DAY   omeprazole  40 MG capsule Commonly known as: PRILOSEC Take 1 capsule (40 mg total) by mouth 2 (two) times daily.   sulfamethoxazole -trimethoprim  400-80 MG tablet Commonly known as: BACTRIM  Take 1 tablet by mouth at bedtime.   SUMAtriptan  100 MG tablet Commonly known as: IMITREX  TAKE 1 TABLET (100 MG TOTAL) BY MOUTH EVERY 2 (TWO) HOURS AS NEEDED FOR MIGRAINE. MAY REPEAT IN 2 HOURS IF HEADACHE PERSISTS OR RECURS.   traZODone  50 MG tablet Commonly known as: DESYREL  TAKE 1 TABLET BY MOUTH EVERYDAY AT BEDTIME   venlafaxine  XR 150 MG 24 hr capsule Commonly known as: EFFEXOR -XR Take 1 capsule (150 mg total) by mouth daily.        Follow-up Information     [facility] Follow up.   Contact information: [address] [place] [address] [phone]                Discharge Exam: Fredricka Weights   01/10/24 1223  Weight: 91.6 kg   ***  Condition at discharge: {DC Condition:26389}  The results of significant  diagnostics from this hospitalization (including imaging, microbiology, ancillary and laboratory) are listed below for reference.   Imaging Studies: MR Brain W and Wo Contrast Result Date: 01/10/2024 EXAM: MRI BRAIN WITH AND WITHOUT CONTRAST 01/10/2024 06:37:00 PM TECHNIQUE: Multiplanar multisequence MRI of the head/brain was performed with and without the administration of 9 mL (gadobutrol  (GADAVIST ) 1 MMOL/ML injection 9 mL GADOBUTROL  1 MMOL/ML IV SOLN) intravenous contrast. COMPARISON: CT head 09/22/2023 and MRI head 09/20/2023. CLINICAL HISTORY: Mental status change, unknown cause. FINDINGS: BRAIN AND VENTRICLES: No acute infarct. No acute intracranial hemorrhage. No mass effect or midline shift. No hydrocephalus. The sella  is unremarkable. Normal flow voids. No mass or abnormal enhancement. T2 and FLAIR hyperintensity in the periventricular and subcortical white matter similar to prior and suggestive of mild chronic microvascular ischemic changes. Similar related signal abnormality in the pons. Mild generalized parenchymal volume loss. Multiple sequences are degraded by motion artifact as well as artifact from dental hardware. ORBITS: Bilateral lens replacement. No acute abnormality. SINUSES: No acute abnormality. BONES AND SOFT TISSUES: Normal bone marrow signal and enhancement. No acute soft tissue abnormality. IMPRESSION: 1. No acute intracranial abnormality. 2. Mild chronic microvascular ischemic changes and mild generalized parenchymal volume loss. Electronically signed by: Donnice Mania MD 01/10/2024 07:51 PM EST RP Workstation: [id number]   MR LUMBAR SPINE WO CONTRAST Result Date: 01/10/2024 CLINICAL DATA:  Lumbar radiculopathy, trauma. EXAM: MRI LUMBAR SPINE WITHOUT CONTRAST TECHNIQUE: Multiplanar, multisequence MR imaging of the lumbar spine was performed. No intravenous contrast was administered. COMPARISON:  MRI lumbar spine 11/12/2023. FINDINGS: Segmentation: The lowest well-formed disc space is labeled L5-S1. This disc space demonstrates moderate disc height loss related to degenerative changes, similar to prior. There is a rudimentary disc space at the S1-2 level. Numbering is concordant with the prior studies. Alignment: Lumbar lordosis is maintained. Similar grade 1 anterolisthesis of L4 on L5. Vertebrae: Redemonstrated compression fracture of L1 with proximally 80% height loss centrally overall similar to prior approximately 4-5 mm retropulsion at the inferior endplate of L1 is also similar to prior. Additional compression fracture of L2 which demonstrates up to 60% height loss, slighlty increased along the anterior aspect compared to prior. Schmorl's nodes at multiple levels. Vertebral body heights are otherwise  maintained. Persistent edema within the L1 vertebral body which may be related to micro motion at the fracture site. There is slightly increased edema within the anterior aspect of the L2 vertebral body with slightly increased height loss anteriorly. No evidence of additional acute fracture. Conus medullaris and cauda equina: Conus extends to the L2 level. Conus and cauda equina appear normal. Paraspinal and other soft tissues: Mild paraspinal muscular atrophy. Similar appearance of bilateral renal cysts. Disc levels: T12-L1: Small disc bulge indents the ventral thecal sac without evidence of significant spinal canal stenosis on sagittal images. Bilateral facet arthrosis. Mild foraminal stenosis on the left. L1-2: Retropulsion of fracture fragments at L1 along with disc bulge which indents the ventral thecal sac. Bilateral facet arthrosis indents the dorsal thecal sac. There is moderate to severe spinal canal stenosis which appears similar to prior. Similar compression of the conus medullaris and crowding of the cauda equina nerve roots. There is mild bilateral foraminal stenosis with partial contribution from retropulsion of fracture fragments overall similar to prior. L2-3: Small disc bulge moderate facet arthrosis. Similar mild spinal canal stenosis. There is mild bilateral neural foraminal stenosis. L3-4: Small disc bulge. Moderate bilateral facet arthrosis and thickening of the ligamentum flavum. No significant spinal canal stenosis. No significant  foraminal stenosis. L4-5: Partial uncovering of the disc. Small disc bulge. Mild lateral recess narrowing. Moderate to severe facet arthrosis. Mild thickening of the ligamentum flavum. There is no significant spinal canal stenosis. No significant foraminal stenosis. L5-S1: Moderate disc height loss. Small right paracentral disc protrusion. Moderate bilateral facet arthrosis. Thickening of the ligamentum flavum. No significant spinal canal stenosis. There is mild  foraminal stenosis on the left. IMPRESSION: Redemonstrated compression fractures of L1 and L2. Persistent edema at the L1 level may be related to micro-motion at the fracture site. Additionally there is increased edema anteriorly within the L2 vertebral body with increased height loss of L2 anteriorly. Degenerative changes as above overall similar to prior. Similar moderate to severe spinal canal stenosis at L1-2 secondary to retropulsion of fracture fragments and disc bulge. Similar mild compression of the conus medullaris. Electronically Signed   By: Donnice Mania M.D.   On: 01/10/2024 19:12   DG Lumbar Spine Complete Result Date: 01/10/2024 CLINICAL DATA:  Fall EXAM: LUMBAR SPINE - COMPLETE 4+ VIEW COMPARISON:  MRI lumbar spine 926 2025 FINDINGS: The bones are diffusely osteopenic. Compression deformities of L1 and L2 appear similar to the prior study. There is retropulsion of fracture fragments at L1, unchanged. No new fractures are seen. At L4-L5 there is 2 mm of anterolisthesis which is stable. There is severe disc space narrowing at L5-S1. Bilateral hip arthroplasties are present. IMPRESSION: 1. No acute fracture. 2. Stable compression deformities of L1 and L2. 3. Stable grade 1 anterolisthesis at L4-L5. Electronically Signed   By: Greig Pique M.D.   On: 01/10/2024 14:58    Microbiology: Results for orders placed or performed during the hospital encounter of 04/01/23  Resp panel by RT-PCR (RSV, Flu A&B, Covid) Anterior Nasal Swab     Status: Abnormal   Collection Time: 04/01/23 12:53 PM   Specimen: Anterior Nasal Swab  Result Value Ref Range Status   SARS Coronavirus 2 by RT PCR NEGATIVE NEGATIVE Final    Comment: (NOTE) SARS-CoV-2 target nucleic acids are NOT DETECTED.  The SARS-CoV-2 RNA is generally detectable in upper respiratory specimens during the acute phase of infection. The lowest concentration of SARS-CoV-2 viral copies this assay can detect is 138 copies/mL. A negative result  does not preclude SARS-Cov-2 infection and should not be used as the sole basis for treatment or other patient management decisions. A negative result may occur with  improper specimen collection/handling, submission of specimen other than nasopharyngeal swab, presence of viral mutation(s) within the areas targeted by this assay, and inadequate number of viral copies(<138 copies/mL). A negative result must be combined with clinical observations, patient history, and epidemiological information. The expected result is Negative.  Fact Sheet for Patients:  bloggercourse.com  Fact Sheet for Healthcare Providers:  seriousbroker.it  This test is no t yet approved or cleared by the [place]  FDA and  has been authorized for detection and/or diagnosis of SARS-CoV-2 by FDA under an Emergency Use Authorization (EUA). This EUA will remain  in effect (meaning this test can be used) for the duration of the COVID-19 declaration under Section 564(b)(1) of the Act, 21 U.S.C.section 360bbb-3(b)(1), unless the authorization is terminated  or revoked sooner.       Influenza A by PCR POSITIVE (A) NEGATIVE Final   Influenza B by PCR NEGATIVE NEGATIVE Final    Comment: (NOTE) The Xpert Xpress SARS-CoV-2/FLU/RSV plus assay is intended as an aid in the diagnosis of influenza from Nasopharyngeal swab specimens and should not be  used as a sole basis for treatment. Nasal washings and aspirates are unacceptable for Xpert Xpress SARS-CoV-2/FLU/RSV testing.  Fact Sheet for Patients: bloggercourse.com  Fact Sheet for Healthcare Providers: seriousbroker.it  This test is not yet approved or cleared by the [place]  FDA and has been authorized for detection and/or diagnosis of SARS-CoV-2 by FDA under an Emergency Use Authorization (EUA). This EUA will remain in effect (meaning this test can be used)  for the duration of the COVID-19 declaration under Section 564(b)(1) of the Act, 21 U.S.C. section 360bbb-3(b)(1), unless the authorization is terminated or revoked.     Resp Syncytial Virus by PCR NEGATIVE NEGATIVE Final    Comment: (NOTE) Fact Sheet for Patients: bloggercourse.com  Fact Sheet for Healthcare Providers: seriousbroker.it  This test is not yet approved or cleared by the [place]  FDA and has been authorized for detection and/or diagnosis of SARS-CoV-2 by FDA under an Emergency Use Authorization (EUA). This EUA will remain in effect (meaning this test can be used) for the duration of the COVID-19 declaration under Section 564(b)(1) of the Act, 21 U.S.C. section 360bbb-3(b)(1), unless the authorization is terminated or revoked.  Performed at [facility], [address], [place], [place] [address]    *Note: Due to a large number of results and/or encounters for the requested time period, some results have not been displayed. A complete set of results can be found in Results Review.    Labs: CBC: Recent Labs  Lab 01/10/24 1331 01/11/24 0500 01/12/24 0356  WBC 7.1 5.3 6.1  HGB 11.6* 9.8* 9.9*  HCT 34.7* 29.7* 29.7*  MCV 99.7 99.7 99.3  PLT 334 284 275   Basic Metabolic Panel: Recent Labs  Lab 01/10/24 1331 01/11/24 0500 01/12/24 0356  NA 137 140 139  K 3.6 3.2* 3.6  CL 104 106 107  CO2 21* 22 24  GLUCOSE 95 160* 106*  BUN 24* 17 10  CREATININE 1.21* 1.02* 0.81  CALCIUM  8.9 8.5* 8.9  MG  --  2.1  --    Liver Function Tests: Recent Labs  Lab 01/10/24 1331 01/11/24 0500  AST 36 26  ALT 27 19  ALKPHOS 72 62  BILITOT 0.8 0.6  PROT 6.9 5.7*  ALBUMIN 3.2* 2.8*   CBG: No results for input(s): GLUCAP in the last 168 hours.  Discharge time spent: {LESS THAN/GREATER UYJW:73611} 30 minutes.  Signed: Burnard DELENA Cunning, DO [facility] 01/12/2024

## 2024-01-12 NOTE — Telephone Encounter (Signed)
 Pt rescheduled 12/3 with Dr. Court.

## 2024-01-12 NOTE — Assessment & Plan Note (Signed)
 Followed by Neurosurgery. No acute issues. PT/OT

## 2024-01-12 NOTE — Progress Notes (Signed)
 Physical Therapy Treatment Patient Details Name: Tonya Bartlett MRN: [id number] DOB: [birth year] Today's Date: 01/12/2024   History of Present Illness Pt is a 78 y.o. female presenting 11/24 with multiple falls in the past week she attributes to LLE weakness and disequilibrium.   PMH: HTN, GERD, migraine, GAD, chronic pain syndrome, HLD. UA suggests UTI; MRI brain negative; MRI lumbar spine re demonstrates L1-2 compression fracture.    PT Comments  Patient seen for progression of ambulation with rollator and stairs practice.  Able to demonstrate bed mobility, transfers and ambulation with rollator with supervision. She was negative for BPPV today testing all canals.  She did have one LOB though self recovered.  She will need some CGA for safety on stairs for home entry/exit and reports family can provide.  Wearing a watch that can detect motion of falls and contact family &/or EMS.  She prefers home and is stable at this time though reviewed in detail her understanding of how to prevent falls and further methods for safety (including not using step ladders).  She will need HHPT at d/c and family support for home entry.    If plan is discharge home, recommend the following: Assist for transportation;Help with stairs or ramp for entrance;Assistance with cooking/housework   Can travel by private vehicle        Equipment Recommendations  None recommended by PT    Recommendations for Other Services       Precautions / Restrictions Precautions Precautions: Fall Recall of Precautions/Restrictions: Intact     Mobility  Bed Mobility Overal bed mobility: Modified Independent             General bed mobility comments: increased time    Transfers Overall transfer level: Needs assistance Equipment used: Rollator (4 wheels) Transfers: Sit to/from Stand Sit to Stand: Supervision           General transfer comment: using good safety techniques with locking rollator     Ambulation/Gait Ambulation/Gait assistance: Supervision Gait Distance (Feet): 150 Feet Assistive device: Rollator (4 wheels) Gait Pattern/deviations: Step-through pattern, Decreased stride length, Trunk flexed, Wide base of support       General Gait Details: LOB x 1 with scissoring to catch balance close S for safety   Stairs Stairs: Yes Stairs assistance: Supervision, Contact guard assist Stair Management: One rail Left, Step to pattern, Forwards, Sideways Number of Stairs: 7 General stair comments: to simulate home entry with L rail to ascend step to pattern forward, then side step down with both hands to rail due to R wrist in splint and S to CGA for safety; educated for family to assist   Wheelchair Mobility     Tilt Bed    Modified Rankin (Stroke Patients Only)       Balance Overall balance assessment: Needs assistance   Sitting balance-Leahy Scale: Good     Standing balance support: No upper extremity supported Standing balance-Leahy Scale: Fair Standing balance comment: during transition to stairs without rollator                            Communication Communication Communication: No apparent difficulties  Cognition Arousal: Alert Behavior During Therapy: WFL for tasks assessed/performed   PT - Cognitive impairments: No apparent impairments                         Following commands: Intact      Cueing Cueing Techniques:  Verbal cues  Exercises      General Comments General comments (skin integrity, edema, etc.): completed supine head roll and dix hall pike bilaterally using bed features without symptoms of dizziness or nystagmus.  Able to in detail recount her sequence for accessing home with stairs for safety using rollator then rail to descend placing package or purse on the landing then retrieving it once descended and using cane to get to car where she has another rollator.  Also able to relate what she has learned to  help reduce fall risk; mainly slowing down and keeping from quick right head turns or else returning head to midline; also discussed footwear, lighting, clear pathways, seat in shower and placing items frequently used within reach.  Strongly cautioned against step stool and for daughter to help her placed items in reach      Pertinent Vitals/Pain Pain Assessment Pain Assessment: Faces Faces Pain Scale: Hurts little more Pain Location: back, neck Pain Descriptors / Indicators: Discomfort, Guarding Pain Intervention(s): Monitored during session    Home Living                          Prior Function            PT Goals (current goals can now be found in the care plan section) Progress towards PT goals: Progressing toward goals    Frequency    Min 2X/week      PT Plan      Co-evaluation              AM-PAC PT 6 Clicks Mobility   Outcome Measure  Help needed turning from your back to your side while in a flat bed without using bedrails?: A Little Help needed moving from lying on your back to sitting on the side of a flat bed without using bedrails?: A Little Help needed moving to and from a bed to a chair (including a wheelchair)?: A Little Help needed standing up from a chair using your arms (e.g., wheelchair or bedside chair)?: None Help needed to walk in hospital room?: None Help needed climbing 3-5 steps with a railing? : A Little 6 Click Score: 20    End of Session Equipment Utilized During Treatment: Gait belt Activity Tolerance: Patient tolerated treatment well Patient left: in bed;with call bell/phone within reach Nurse Communication: Mobility status PT Visit Diagnosis: Dizziness and giddiness (R42);Unsteadiness on feet (R26.81)     Time: 8579-8491 PT Time Calculation (min) (ACUTE ONLY): 48 min  Charges:    $Gait Training: 8-22 mins $Neuromuscular Re-education: 8-22 mins $Self Care/Home Management: 8-22 PT General Charges $$ ACUTE PT  VISIT: 1 Visit                     Micheline Portal, PT Acute Rehabilitation Services Office:[phone] 01/12/2024    Montie Portal 01/12/2024, 5:14 PM

## 2024-01-12 NOTE — Progress Notes (Signed)
 Transition of Care [facility]) - Inpatient Brief Assessment   Patient Details  Name: Tonya Bartlett MRN: [id number] Date of Birth: 1946-02-15  Transition of Care [facility]) CM/SW Contact:    Rosaline JONELLE Joe, RN Phone Number: 01/12/2024, 12:42 PM   Clinical Narrative: CM met with the patient at the bedside and patient would like to return home with home health if able.  Patient plans to work with therapy today to evaluate her ability to return home alone.  DME in the home includesRW, [employer], Rolator, toilet riser, electric scooter and shower seat.  Patient states that she prefers to go home with home health.  I sent message to PT to evaluate patient today.  Patient states that she was recently active with [facility] health and would like to have return of their services if she returns home.  I faxed patient out in the hub for choice earlier but patient has preference and chose [facility].  Home health orders for PT, OT, aide and MSW placed - pending eval by PT today.  Patient has family in the area to assist if needed.  Patient plans to move to a retirement community in the near future - MSW was added to home health orders to assist in the community.   Transition of Care Asessment: Insurance and Status: (P) Insurance coverage has been reviewed Patient has primary care physician: (P) Yes Home environment has been reviewed: (P) from home alone Prior level of function:: (P) self Prior/Current Home Services: (P) No current home services (DME at the home includes) Social Drivers of Health Review: (P) SDOH reviewed needs interventions Readmission risk has been reviewed: (P) Yes Transition of care needs: (P) transition of care needs identified, TOC will continue to follow

## 2024-01-13 DIAGNOSIS — N179 Acute kidney failure, unspecified: Secondary | ICD-10-CM | POA: Diagnosis not present

## 2024-01-13 MED ORDER — LOSARTAN POTASSIUM 50 MG PO TABS
100.0000 mg | ORAL_TABLET | Freq: Every day | ORAL | Status: DC
  Administered 2024-01-13 – 2024-01-14 (×2): 100 mg via ORAL
  Filled 2024-01-13 (×2): qty 2

## 2024-01-13 MED ORDER — HYDRALAZINE HCL 20 MG/ML IJ SOLN: 10.0000 mg | INTRAMUSCULAR | Status: DC | PRN

## 2024-01-13 NOTE — Plan of Care (Signed)

## 2024-01-13 NOTE — Progress Notes (Signed)
 PROGRESS NOTE  Tonya Bartlett  FMW:[id number] DOB: 02-May-1945 DOA: 01/10/2024 PCP: Merna Huxley, NP   78 year old female past medical history of hypertension GERD, migraine, generalized anxiety disorder, chronic pain syndrome, insomnia, and hyperlipidemia presented to emergency department valuation for multiple fall in last 1 week.  Patient reported it has been getting worse for past few days.  Patient hit her head multiple times when falling.  Denies any recent head trauma.  Reported having disequilibrium and left-sided lower extremity weakness that is causing recurrent fall. Patient reported she has history of lumbar radiculopathy and has been seen by neurosurgery in the past however it has been recently getting worse.  Complaining about back pain and it is getting worse since multiple fall.  At presentation to ED patient is hemodynamically stable. Lab, CBC unremarkable.  CMP showed low bicarb 21, elevated creatinine 1.21, low GFR 46.  Normal ammonia level.  CBC unremarkable normal WBC count, stable H&H normal platelet count.  UA evidence of UTI.  Normal ammonia level.  MRI of the brain no evidence of acute intracranial abnormality.  Chronic microvascular ischemic changes.  MRI lumbar spine redemonstrate compression fracture L1-L2.  In the ED patient received 1 L of NS bolus.  Hospitalist consulted for further evaluation management of recurrent fall and AKI.  11/25: assumed care of patient. AKI nearly resolved and anticipate complete resolution tomorrow.  She had vestibular physical therapy today and likely will need 2-3 more sessions pending their recommendation.  Continue precaution.  PT, OT, vestibular PT, TOC consulted.  Assessment & Plan:   Principal Problem:   AKI (acute kidney injury) Active Problems:   Vertigo   Hyperlipidemia   Essential hypertension   Recurrent falls   GAD (generalized anxiety disorder)   Chronic pain syndrome   Lumbar radiculopathy, chronic    Assessment and Plan:  * AKI (acute kidney injury) AKI has resolved with IV fluids. Cr 0.81. Hypokalemia-resolved with replacement yesterday 11/25.  Last K 3.6 on 11/26 Completed IV fluids --Monitor off IV fluids --Monitor BMP  Vertigo Suspect BPPV, localized to the right side --Continue vestibular physical therapy --Continue PT/OT - with HH versus SNF -patient agreeable to SNF --Fall precaution  Lumbar radiculopathy, chronic Followed by Neurosurgery. No acute issues. PT/OT  Chronic pain syndrome PDMP appropriate with active prescription for hydrocodone -acetaminophen  5-325 mg p.o. written on 10/20, filled on 10/28 for 30-day supply  GAD (generalized anxiety disorder) --Continue home regimen: bupropion  100 mg daily, venlafaxine  150 mg daily  Recurrent falls Fall precautions PT/OT evaluations --SNF recommended for rehab TOC following for placement Treat for BPPV  Essential hypertension Hypertensive urgency -not present on admission.  This a.m., BP elevated at 196/90.  Patient denies headache or other symptoms --Resume home losartan  which was held for AKI on admission --Continue metoprolol  tartrate 25 mg p.o. twice daily resumed --Hydralazine  IV PRN --Titrate regimen  Hyperlipidemia --Continue atorvastatin  40 mg daily  DVT prophylaxis: Enoxaparin  Code Status: Full code Family Communication: No Disposition Plan: Pending clinical course Level of care: Telemetry  Consultants:  PT, OT, vestibular physical therapy, TOC  Procedures:  Epley maneuver  Antimicrobials: None indicated  Subjective:  Pt awake resting in bed when seen on rounds this morning.  She decided after speaking with family yesterday on going to rehab.  She reports recurrent vertigo symptoms when up to use the bedside commode, had no equilibrium when standing up to get back in bed states she literally fell back into the bed.  She denies other acute complaints at this  time.   Objective: Vitals:    01/13/24 0010 01/13/24 0504 01/13/24 0527 01/13/24 0937  BP: (!) 158/75 (!) 166/84 (!) 168/83 (!) 196/90  Pulse: 87 74 82 65  Resp: 18 17    Temp: 97.8 F (36.6 C) 98.1 F (36.7 C)    TempSrc: Oral Oral    SpO2: 96% 100%    Weight:      Height:       No intake or output data in the 24 hours ending 01/13/24 1145  Filed Weights   01/10/24 1223  Weight: 91.6 kg   Examination:  General exam: awake, alert, no acute distress HEENT: moist mucus membranes, hearing grossly normal  Respiratory system: on room air, normal respiratory effort. Lungs CTAB Cardiovascular system: normal S1/S2, RRR, no peripheral edema.   Gastrointestinal system: soft, nontender, nondistended Central nervous system: A&O x 4. no gross focal neurologic deficits, normal speech Extremities: moves all, no edema, normal tone Skin: dry, intact, normal temperature Psychiatry: normal mood, congruent affect, judgement and insight appear normal   Data Reviewed: I have personally reviewed following labs and imaging studies  CBC: Recent Labs  Lab 01/10/24 1331 01/11/24 0500 01/12/24 0356  WBC 7.1 5.3 6.1  HGB 11.6* 9.8* 9.9*  HCT 34.7* 29.7* 29.7*  MCV 99.7 99.7 99.3  PLT 334 284 275   Basic Metabolic Panel: Recent Labs  Lab 01/10/24 1331 01/11/24 0500 01/12/24 0356  NA 137 140 139  K 3.6 3.2* 3.6  CL 104 106 107  CO2 21* 22 24  GLUCOSE 95 160* 106*  BUN 24* 17 10  CREATININE 1.21* 1.02* 0.81  CALCIUM  8.9 8.5* 8.9  MG  --  2.1  --    GFR: Estimated Creatinine Clearance: 62.9 mL/min (by C-G formula based on SCr of 0.81 mg/dL).  Liver Function Tests: Recent Labs  Lab 01/10/24 1331 01/11/24 0500  AST 36 26  ALT 27 19  ALKPHOS 72 62  BILITOT 0.8 0.6  PROT 6.9 5.7*  ALBUMIN 3.2* 2.8*   Recent Labs  Lab 01/10/24 1612  AMMONIA <13   Cardiac Enzymes: Recent Labs  Lab 01/11/24 0505  CKTOTAL 98   BNP (last 3 results) Recent Labs    01/20/23 1059  PROBNP 219   Radiology  Studies: No results found.  Scheduled Meds:  atorvastatin   40 mg Oral Daily   buPROPion   150 mg Oral Daily   enoxaparin  (LOVENOX ) injection  40 mg Subcutaneous QHS   losartan   100 mg Oral Daily   meclizine   12.5 mg Oral TID   metoprolol  tartrate  25 mg Oral BID   pantoprazole   40 mg Oral Daily   sodium chloride  flush  3 mL Intravenous Q12H   sodium chloride  flush  3 mL Intravenous Q12H   venlafaxine  XR  150 mg Oral Daily   Continuous Infusions:    LOS: 2 days   Time spent: 35 minutes  Dr. Burnard Cunning, DO [facility]   If 7PM-7AM, please contact night-coverage 01/13/2024, 11:45 AM

## 2024-01-13 NOTE — Plan of Care (Signed)
  Problem: Skin Integrity: Goal: Risk for impaired skin integrity will decrease Outcome: Progressing   Problem: Safety: Goal: Ability to remain free from injury will improve Outcome: Progressing   Problem: Pain Managment: Goal: General experience of comfort will improve and/or be controlled Outcome: Progressing

## 2024-01-14 DIAGNOSIS — F411 Generalized anxiety disorder: Secondary | ICD-10-CM | POA: Diagnosis not present

## 2024-01-14 DIAGNOSIS — R2689 Other abnormalities of gait and mobility: Secondary | ICD-10-CM | POA: Diagnosis not present

## 2024-01-14 DIAGNOSIS — S6990XD Unspecified injury of unspecified wrist, hand and finger(s), subsequent encounter: Secondary | ICD-10-CM | POA: Diagnosis not present

## 2024-01-14 DIAGNOSIS — S32010D Wedge compression fracture of first lumbar vertebra, subsequent encounter for fracture with routine healing: Secondary | ICD-10-CM | POA: Diagnosis not present

## 2024-01-14 DIAGNOSIS — K219 Gastro-esophageal reflux disease without esophagitis: Secondary | ICD-10-CM | POA: Diagnosis not present

## 2024-01-14 DIAGNOSIS — R2681 Unsteadiness on feet: Secondary | ICD-10-CM | POA: Diagnosis not present

## 2024-01-14 DIAGNOSIS — M5416 Radiculopathy, lumbar region: Secondary | ICD-10-CM | POA: Diagnosis not present

## 2024-01-14 DIAGNOSIS — E785 Hyperlipidemia, unspecified: Secondary | ICD-10-CM | POA: Diagnosis not present

## 2024-01-14 DIAGNOSIS — I10 Essential (primary) hypertension: Secondary | ICD-10-CM | POA: Diagnosis not present

## 2024-01-14 DIAGNOSIS — H8111 Benign paroxysmal vertigo, right ear: Secondary | ICD-10-CM | POA: Diagnosis not present

## 2024-01-14 DIAGNOSIS — M6281 Muscle weakness (generalized): Secondary | ICD-10-CM | POA: Diagnosis not present

## 2024-01-14 DIAGNOSIS — N179 Acute kidney failure, unspecified: Secondary | ICD-10-CM | POA: Diagnosis not present

## 2024-01-14 DIAGNOSIS — S32020D Wedge compression fracture of second lumbar vertebra, subsequent encounter for fracture with routine healing: Secondary | ICD-10-CM | POA: Diagnosis not present

## 2024-01-14 DIAGNOSIS — G894 Chronic pain syndrome: Secondary | ICD-10-CM | POA: Diagnosis not present

## 2024-01-14 DIAGNOSIS — R296 Repeated falls: Secondary | ICD-10-CM | POA: Diagnosis not present

## 2024-01-14 LAB — BASIC METABOLIC PANEL WITH GFR
Anion gap: 12 (ref 5–15)
BUN: 9 mg/dL (ref 8–23)
CO2: 24 mmol/L (ref 22–32)
Calcium: 8.9 mg/dL (ref 8.9–10.3)
Chloride: 104 mmol/L (ref 98–111)
Creatinine, Ser: 0.75 mg/dL (ref 0.44–1.00)
GFR, Estimated: >60 mL/min (ref >60)
Glucose, Bld: 98 mg/dL (ref 70–99)
Potassium: 3.3 mmol/L — ABNORMAL LOW (ref 3.5–5.1)
Sodium: 140 mmol/L (ref 135–145)

## 2024-01-14 MED ORDER — HYDROCODONE-ACETAMINOPHEN 5-325 MG PO TABS
1.0000 | ORAL_TABLET | ORAL | 0 refills | Status: AC | PRN
Start: 2024-01-14 — End: ?

## 2024-01-14 MED ORDER — MECLIZINE HCL 12.5 MG PO TABS: 12.5000 mg | ORAL_TABLET | Freq: Three times a day (TID) | ORAL | Status: DC

## 2024-01-14 MED ORDER — METOPROLOL TARTRATE 50 MG PO TABS: 50.0000 mg | ORAL_TABLET | Freq: Two times a day (BID) | ORAL | Status: AC

## 2024-01-14 MED ORDER — METOPROLOL TARTRATE 50 MG PO TABS
50.0000 mg | ORAL_TABLET | Freq: Two times a day (BID) | ORAL | Status: DC
  Administered 2024-01-14: 50 mg via ORAL
  Filled 2024-01-14: qty 1

## 2024-01-14 NOTE — Care Management Important Message (Signed)
 Important Message  Patient Details  Name: Tonya Bartlett MRN: [id number] Date of Birth: [birth year]   Important Message Given:  No     Jennie Laneta Dragon 01/14/2024, 3:27 PM

## 2024-01-14 NOTE — Progress Notes (Signed)
Report called to [facility]. ?

## 2024-01-14 NOTE — Progress Notes (Signed)
 Physical Therapy Treatment Patient Details Name: Tonya Bartlett MRN: [id number] DOB: 1945/06/23 Today's Date: 01/14/2024   History of Present Illness Pt is a 78 y.o. female presenting 11/24 with multiple falls in the past week she attributes to LLE weakness and disequilibrium.   PMH: HTN, GERD, migraine, GAD, chronic pain syndrome, HLD. UA suggests UTI; MRI brain negative; MRI lumbar spine re demonstrates L1-2 compression fracture.    PT Comments  Pt requested re-testing of inner ear canals due to ongoing dizziness and falls. She did flag positive for L posterior canal BPPV as well as R horizontal canal BPPV this morning, strongly recommend skilled PT vestibular follow-up when this can be arranged. Progressed gait distance today, still mildly unsteady and easily fatigued. Per social work haematologist, possibly going to [facility] today.     If plan is discharge home, recommend the following: Assist for transportation;Help with stairs or ramp for entrance;Assistance with cooking/housework   Can travel by private vehicle        Equipment Recommendations  None recommended by PT    Recommendations for Other Services       Precautions / Restrictions Precautions Precautions: Fall Recall of Precautions/Restrictions: Intact Restrictions Weight Bearing Restrictions Per Provider Order: No     Mobility  Bed Mobility Overal bed mobility: Modified Independent             General bed mobility comments: increased time    Transfers Overall transfer level: Needs assistance Equipment used: Rollator (4 wheels) Transfers: Sit to/from Stand Sit to Stand: Modified independent (Device/Increase time)           General transfer comment: using good safety techniques with locking rollator    Ambulation/Gait Ambulation/Gait assistance: Supervision Gait Distance (Feet): 200 Feet Assistive device: Rollator (4 wheels) Gait Pattern/deviations: Step-through pattern, Decreased stride length, Trunk  flexed, Wide base of support Gait velocity: decreased     General Gait Details: no LOB noted today but still had some staggering and narrow BOS and clear fatigue with extended gait distance   Stairs             Wheelchair Mobility     Tilt Bed    Modified Rankin (Stroke Patients Only)       Balance Overall balance assessment: Mild deficits observed, not formally tested Sitting-balance support: No upper extremity supported, Feet supported Sitting balance-Leahy Scale: Good     Standing balance support: No upper extremity supported Standing balance-Leahy Scale: Fair                              Hotel Manager: No apparent difficulties  Cognition Arousal: Alert Behavior During Therapy: WFL for tasks assessed/performed   PT - Cognitive impairments: Safety/Judgement                       PT - Cognition Comments: definite impairments in safety and judgement- stated that she crawled over the end of the bed trying to go to the bathroom earlier this morning, needed intermittent cues for safety during session Following commands: Intact      Cueing Cueing Techniques: Verbal cues  Exercises      General Comments General comments (skin integrity, edema, etc.): re-tested vestibular canals per pt request using bed features- had positive nystagmus for L posterior BPPV and R horizontal BPPV today      Pertinent Vitals/Pain Pain Assessment Pain Assessment: No/denies pain Pain Score: 0-No pain Faces Pain  Scale: No hurt Pain Intervention(s): Limited activity within patient's tolerance, Monitored during session    Home Living                          Prior Function            PT Goals (current goals can now be found in the care plan section) Acute Rehab PT Goals Patient Stated Goal: to improve dizziness to stop falling PT Goal Formulation: With patient Time For Goal Achievement: 01/25/24 Potential to  Achieve Goals: Fair Additional Goals Additional Goal #1: Will have negative Epley on the R and be able to look to the R superior visual quadrant without dizziness. Progress towards PT goals: Progressing toward goals    Frequency    Min 2X/week      PT Plan      Co-evaluation              AM-PAC PT 6 Clicks Mobility   Outcome Measure  Help needed turning from your back to your side while in a flat bed without using bedrails?: A Little Help needed moving from lying on your back to sitting on the side of a flat bed without using bedrails?: A Little Help needed moving to and from a bed to a chair (including a wheelchair)?: A Little Help needed standing up from a chair using your arms (e.g., wheelchair or bedside chair)?: None Help needed to walk in hospital room?: None Help needed climbing 3-5 steps with a railing? : A Little 6 Click Score: 20    End of Session   Activity Tolerance: Patient tolerated treatment well Patient left: in chair;with call bell/phone within reach;with chair alarm set Nurse Communication: Mobility status PT Visit Diagnosis: Dizziness and giddiness (R42);Unsteadiness on feet (R26.81)     Time: 8885-8854 PT Time Calculation (min) (ACUTE ONLY): 31 min  Charges:    $Gait Training: 8-22 mins $Neuromuscular Re-education: 8-22 mins PT General Charges $$ ACUTE PT VISIT: 1 Visit                     Josette Rough, PT, DPT 01/14/24 11:52 AM

## 2024-01-14 NOTE — TOC Transition Note (Signed)
 Transition of Care [facility]) - Discharge Note   Patient Details  Name: Tonya Bartlett MRN: [id number] Date of Birth: [birth year]  Transition of Care [facility]) CM/SW Contact:  Sherline Clack, LCSWA Phone Number: 01/14/2024, 11:57 AM   Clinical Narrative:     Patient will DC to: [facility] Anticipated DC date: 01/14/24  Family notified: James/son Transport by: [facility]   Per MD patient ready for DC to [facility]. RN to call report prior to discharge [phone], rm [phone] ). RN, patient, patient's family, and facility notified of DC. Discharge Summary and FL2 sent to facility. DC packet on chart. Ambulance transport requested for patient.   CSW will sign off for now as social work intervention is no longer needed. Please consult us  again if new needs arise.    Final next level of care: Skilled Nursing Facility Barriers to Discharge: Barriers Resolved   Patient Goals and CMS Choice            Discharge Placement              Patient chooses bed at: [facility] Patient to be transferred to facility by: [facility] Name of family member notified: James/son Patient and family notified of of transfer: 01/14/24  Discharge Plan and Services Additional resources added to the After Visit Summary for                                       Social Drivers of Health (SDOH) Interventions SDOH Screenings   Food Insecurity: No Food Insecurity (01/11/2024)  Housing: High Risk (01/11/2024)  Transportation Needs: No Transportation Needs (01/11/2024)  Utilities: Not At Risk (01/11/2024)  Alcohol Screen: Low Risk  (04/18/2021)  Depression (PHQ2-9): Medium Risk (03/26/2023)  Financial Resource Strain: Low Risk  (04/18/2021)  Physical Activity: Inactive (04/18/2021)  Social Connections: Moderately Integrated (01/11/2024)  Stress: No Stress Concern Present (04/18/2021)  Tobacco Use: Medium Risk (01/11/2024)     Readmission Risk Interventions    01/12/2024   12:41 PM   Readmission Risk Prevention Plan  Post Dischage Appt Complete  Medication Screening Complete  Transportation Screening Complete

## 2024-01-14 NOTE — Progress Notes (Signed)
 Patient discharged, Important Message to be mailed to patient.

## 2024-01-14 NOTE — Progress Notes (Signed)
 Occupational Therapy Treatment Patient Details Name: Tonya Bartlett MRN: [id number] DOB: 16-Aug-1945 Today's Date: 01/14/2024   History of present illness Pt is a 78 y.o. female presenting 11/24 with multiple falls in the past week she attributes to LLE weakness and disequilibrium.   PMH: HTN, GERD, migraine, GAD, chronic pain syndrome, HLD. UA suggests UTI; MRI brain negative; MRI lumbar spine re demonstrates L1-2 compression fracture.   OT comments  Pt making good progress towards OT goals. Pt able to manage bathroom mobility, toilet transfer and grooming/bathing tasks standing at sink with Supervision. Pt reports family unable to provide consistent assist at home and worried about pt's recent falls; requesting continued inpatient follow up therapy, <3 hours/day       If plan is discharge home, recommend the following:  A little help with bathing/dressing/bathroom;Assistance with cooking/housework;Assist for transportation;Help with stairs or ramp for entrance   Equipment Recommendations  None recommended by OT    Recommendations for Other Services      Precautions / Restrictions Precautions Precautions: Fall Restrictions Weight Bearing Restrictions Per Provider Order: No       Mobility Bed Mobility Overal bed mobility: Modified Independent                  Transfers Overall transfer level: Needs assistance Equipment used: None Transfers: Sit to/from Stand Sit to Stand: Supervision                 Balance Overall balance assessment: Mild deficits observed, not formally tested                                         ADL either performed or assessed with clinical judgement   ADL Overall ADL's : Needs assistance/impaired     Grooming: Supervision/safety;Standing;Oral care;Wash/dry face;Wash/dry hands   Upper Body Bathing: Supervision/ safety;Standing Upper Body Bathing Details (indicate cue type and reason): washing under arms standing  at sink             Toilet Transfer: Supervision/safety;Ambulation;Regular Toilet;Grab bars Toilet Transfer Details (indicate cue type and reason): no AD, use of grab bar to pull up with LUE from regular toilet                Extremity/Trunk Assessment Upper Extremity Assessment Upper Extremity Assessment: Right hand dominant;RUE deficits/detail RUE Deficits / Details: reports wrist fx 2 weeks, reports no WB restrictions really but reports able to still use her rollator at home   Lower Extremity Assessment Lower Extremity Assessment: Defer to PT evaluation        Vision   Vision Assessment?: No apparent visual deficits   Perception     Praxis     Communication Communication Communication: No apparent difficulties   Cognition Arousal: Alert Behavior During Therapy: WFL for tasks assessed/performed Cognition: No apparent impairments                               Following commands: Intact        Cueing   Cueing Techniques: Verbal cues  Exercises      Shoulder Instructions       General Comments      Pertinent Vitals/ Pain       Pain Assessment Pain Assessment: No/denies pain  Home Living  Prior Functioning/Environment              Frequency  Min 2X/week        Progress Toward Goals  OT Goals(current goals can now be found in the care plan section)  Progress towards OT goals: Progressing toward goals  Acute Rehab OT Goals Patient Stated Goal: go to [facility] rehab OT Goal Formulation: With patient Time For Goal Achievement: 01/25/24 Potential to Achieve Goals: Good ADL Goals Pt Will Perform Grooming: with modified independence;standing Pt Will Perform Lower Body Dressing: with modified independence;sit to/from stand Pt Will Transfer to Toilet: with modified independence;ambulating Additional ADL Goal #1: pt will identify and implement 3+ fall prevention  strategies with mod I  Plan      Co-evaluation                 AM-PAC OT 6 Clicks Daily Activity     Outcome Measure   Help from another person eating meals?: None Help from another person taking care of personal grooming?: A Little Help from another person toileting, which includes using toliet, bedpan, or urinal?: A Little Help from another person bathing (including washing, rinsing, drying)?: A Little Help from another person to put on and taking off regular upper body clothing?: A Little Help from another person to put on and taking off regular lower body clothing?: A Little 6 Click Score: 19    End of Session Equipment Utilized During Treatment: Gait belt  OT Visit Diagnosis: Unsteadiness on feet (R26.81);Muscle weakness (generalized) (M62.81);Pain;BPPV   Activity Tolerance Patient tolerated treatment well   Patient Left in bed;with call bell/phone within reach   Nurse Communication          Time: 9240-9173 OT Time Calculation (min): 27 min  Charges: OT General Charges $OT Visit: 1 Visit OT Treatments $Self Care/Home Management : 23-37 mins  Tonya Bartlett, OTR/L Acute Rehab Services Office: [phone]   Tonya Bartlett 01/14/2024, 9:58 AM

## 2024-01-14 NOTE — Discharge Summary (Addendum)
 Physician Discharge Summary   Patient: Tonya Bartlett MRN: [id number] DOB: 1946/02/05  Admit date:     01/10/2024  Discharge date: 01/14/24  Discharge Physician: Burnard DELENA Cunning   PCP: Merna Huxley, NP   Recommendations at discharge:   Follow up with Primary Care in 1-2 weeks Repeat CBC, CMP at follow up Follow up on vertigo symptoms for improvement Follow up on Blood Pressure control.  Metoprolol  dose was increased during admission.  Adjust regimen in follow up as needed.  Discharge Diagnoses: Principal Problem:   AKI (acute kidney injury) Active Problems:   Vertigo   Hyperlipidemia   Essential hypertension   Recurrent falls   GAD (generalized anxiety disorder)   Chronic pain syndrome   Lumbar radiculopathy, chronic  Resolved Problems:   Family history of GERD  Hospital Course: 78 year old female past medical history of hypertension GERD, migraine, generalized anxiety disorder, chronic pain syndrome, insomnia, and hyperlipidemia presented to emergency department valuation for multiple fall in last 1 week.  Patient reported it has been getting worse for past few days.  Patient hit her head multiple times when falling.  Denies any recent head trauma.  Reported having disequilibrium and left-sided lower extremity weakness that is causing recurrent fall. Patient reported she has history of lumbar radiculopathy and has been seen by neurosurgery in the past however it has been recently getting worse.  Complaining about back pain and it is getting worse since multiple fall.  At presentation to ED patient is hemodynamically stable. Lab, CBC unremarkable.  CMP showed low bicarb 21, elevated creatinine 1.21, low GFR 46.  Normal ammonia level.  CBC unremarkable normal WBC count, stable H&H normal platelet count.  UA evidence of UTI.  Normal ammonia level.  MRI of the brain no evidence of acute intracranial abnormality.  Chronic microvascular ischemic changes.  MRI lumbar spine  redemonstrate compression fracture L1-L2.  In the ED patient received 1 L of NS bolus.  Hospitalist consulted for further evaluation management of recurrent fall and AKI.  11/25: AKI nearly resolved and anticipate complete resolution tomorrow.  She had vestibular physical therapy today and likely will need 2-3 more sessions pending their recommendation.  Continue precaution.  PT, OT, vestibular PT, TOC consulted.  11/26 -- 27:  pt doing well, AKI resolved.  Still with intermittent dizziness / vertigo.  Pt recommended for SNF for rehab & agreeable.  11/28 -- pt doing well.  Medically stable for discharge and accepted at [facility] for admission today for rehab.     Assessment and Plan: * AKI (acute kidney injury) AKI has resolved with IV fluids. Cr 0.75. Hypokalemia-resolved with replacement yesterday 11/25.  Last K 3.6 on 11/26 Completed IV fluids --Monitor BMP at follow up  Vertigo Suspect BPPV, localized to the right side --Continue vestibular physical therapy --Continue PT/OT at SNF --PRN meclizine   Lumbar radiculopathy, chronic Followed by Neurosurgery. No acute issues. PT/OT  Chronic pain syndrome PDMP appropriate with active prescription for hydrocodone -acetaminophen  5-325 mg p.o. written on 10/20, filled on 10/28 for 30-day supply --Continue home regimen  GAD (generalized anxiety disorder) --Continue home regimen: bupropion  100 mg daily, venlafaxine  150 mg daily  Recurrent falls Fall precautions PT/OT evaluations --SNF recommended for rehab Treat for BPPV  Essential hypertension Hypertensive urgency -Resolved.  BP was elevated up to 196/90 yesterday.  Patient denied headache or other symptoms 11/28: BP's improved to systolic 140's to 150's with losartan  resumed yesterday  --Continue losartan  100 mg daily --Increase metoprolol  tartrate 25 >> 50 mg BID --  Hydralazine  IV PRN --Titrate regimen  Hyperlipidemia --Continue atorvastatin  40 mg daily          Consultants: none Procedures performed: none  Disposition: Skilled nursing facility Diet recommendation:  Discharge Diet Orders (From admission, onward)     Start     Ordered   01/12/24 0000  Diet - low sodium heart healthy        01/12/24 1706            DISCHARGE MEDICATION: Allergies as of 01/14/2024       Reactions   Cuprimine [penicillamine] Anaphylaxis   Nsaids Anaphylaxis, Other (See Comments)   Severe stomach cramping Mouth sores   Otezla [apremilast] Anaphylaxis   Suicidal ideation   Penicillins Anaphylaxis   Erythromycin Other (See Comments)   SEVERE STOMACH CRAMPS   Morphine And Codeine Nausea And Vomiting   Remicade  [infliximab ] Other (See Comments), Hypertension   Shut down immune system   Skyrizi [risankizumab] Other (See Comments), Cough   Upper respiratory infection Urinary tract infection Fever   Tolectin [tolmetin] Other (See Comments)   Severe stomach cramping   Nickel Rash   Including snaps on hospital gowns         Medication List     TAKE these medications    acetaminophen  325 MG tablet Commonly known as: TYLENOL  Take 2 tablets (650 mg total) by mouth every 4 (four) hours as needed for mild pain (pain score 1-3) (or Fever >/= 101).   albuterol  108 (90 Base) MCG/ACT inhaler Commonly known as: VENTOLIN  HFA INHALE 2 PUFFS INTO THE LUNGS EVERY 6 (SIX) HOURS AS NEEDED FOR WHEEZING OR SHORTNESS OF BREATH (COUGH ). START WITH USING IN AM, AROUND NOON AND 1 HOUR BEFORE BEDTIME TO HELP COUGH SYMPTOMS.   atorvastatin  40 MG tablet Commonly known as: LIPITOR TAKE 1 TABLET BY MOUTH EVERY DAY   buPROPion  150 MG 12 hr tablet Commonly known as: WELLBUTRIN  SR Take 1 tablet (150 mg total) by mouth daily.   cetirizine 10 MG tablet Commonly known as: ZYRTEC Take 10 mg by mouth daily.   cyclobenzaprine  5 MG tablet Commonly known as: FLEXERIL  Take 1 tablet (5 mg total) by mouth 3 (three) times daily as needed for muscle spasms.    HYDROcodone -acetaminophen  5-325 MG tablet Commonly known as: NORCO/VICODIN Take 2 tablets by mouth every 4 (four) hours as needed for moderate pain (pain score 4-6) or severe pain (pain score 7-10). What changed: how much to take   losartan  100 MG tablet Commonly known as: COZAAR  TAKE 1 TABLET BY MOUTH EVERY DAY   meclizine  12.5 MG tablet Commonly known as: ANTIVERT  Take 1 tablet (12.5 mg total) by mouth 3 (three) times daily.   metoprolol  tartrate 50 MG tablet Commonly known as: LOPRESSOR  Take 1 tablet (50 mg total) by mouth 2 (two) times daily. What changed:  medication strength how much to take   omeprazole  40 MG capsule Commonly known as: PRILOSEC Take 1 capsule (40 mg total) by mouth 2 (two) times daily.   sulfamethoxazole -trimethoprim  400-80 MG tablet Commonly known as: BACTRIM  Take 1 tablet by mouth at bedtime.   SUMAtriptan  100 MG tablet Commonly known as: IMITREX  TAKE 1 TABLET (100 MG TOTAL) BY MOUTH EVERY 2 (TWO) HOURS AS NEEDED FOR MIGRAINE. MAY REPEAT IN 2 HOURS IF HEADACHE PERSISTS OR RECURS.   traZODone  50 MG tablet Commonly known as: DESYREL  TAKE 1 TABLET BY MOUTH EVERYDAY AT BEDTIME   venlafaxine  XR 150 MG 24 hr capsule Commonly known as: EFFEXOR -XR Take  1 capsule (150 mg total) by mouth daily.        Contact information for follow-up providers     [facility] Follow up.   Contact information: [address] [place] [address] [phone]              Contact information for after-discharge care     Destination     [facility] .   Service: Skilled Nursing Contact information: [address] [place]  [address] [phone]                    Discharge Exam: Fredricka Weights   01/10/24 1223  Weight: 91.6 kg   General exam: awake, alert, no acute distress HEENT: atraumatic, clear conjunctiva, anicteric sclera,moist mucus membranes, hearing grossly normal  Respiratory system:  CTAB, no wheezes, rales or rhonchi, normal respiratory effort. Cardiovascular system: normal S1/S2, RRR, no JVD, murmurs, rubs, gallops, no pedal edema.   Gastrointestinal system: soft, NT, ND, no HSM felt, +bowel sounds. Central nervous system: A&O x3. no gross focal neurologic deficits, normal speech Extremities: moves all , no edema, normal tone Skin: dry, intact, normal temperature Psychiatry: normal mood, congruent affect, judgement and insight appear normal   Condition at discharge: stable  The results of significant diagnostics from this hospitalization (including imaging, microbiology, ancillary and laboratory) are listed below for reference.   Imaging Studies: MR Brain W and Wo Contrast Result Date: 01/10/2024 EXAM: MRI BRAIN WITH AND WITHOUT CONTRAST 01/10/2024 06:37:00 PM TECHNIQUE: Multiplanar multisequence MRI of the head/brain was performed with and without the administration of 9 mL (gadobutrol  (GADAVIST ) 1 MMOL/ML injection 9 mL GADOBUTROL  1 MMOL/ML IV SOLN) intravenous contrast. COMPARISON: CT head 09/22/2023 and MRI head 09/20/2023. CLINICAL HISTORY: Mental status change, unknown cause. FINDINGS: BRAIN AND VENTRICLES: No acute infarct. No acute intracranial hemorrhage. No mass effect or midline shift. No hydrocephalus. The sella is unremarkable. Normal flow voids. No mass or abnormal enhancement. T2 and FLAIR hyperintensity in the periventricular and subcortical white matter similar to prior and suggestive of mild chronic microvascular ischemic changes. Similar related signal abnormality in the pons. Mild generalized parenchymal volume loss. Multiple sequences are degraded by motion artifact as well as artifact from dental hardware. ORBITS: Bilateral lens replacement. No acute abnormality. SINUSES: No acute abnormality. BONES AND SOFT TISSUES: Normal bone marrow signal and enhancement. No acute soft tissue abnormality. IMPRESSION: 1. No acute intracranial abnormality. 2. Mild  chronic microvascular ischemic changes and mild generalized parenchymal volume loss. Electronically signed by: Donnice Mania MD 01/10/2024 07:51 PM EST RP Workstation: [id number]   MR LUMBAR SPINE WO CONTRAST Result Date: 01/10/2024 CLINICAL DATA:  Lumbar radiculopathy, trauma. EXAM: MRI LUMBAR SPINE WITHOUT CONTRAST TECHNIQUE: Multiplanar, multisequence MR imaging of the lumbar spine was performed. No intravenous contrast was administered. COMPARISON:  MRI lumbar spine 11/12/2023. FINDINGS: Segmentation: The lowest well-formed disc space is labeled L5-S1. This disc space demonstrates moderate disc height loss related to degenerative changes, similar to prior. There is a rudimentary disc space at the S1-2 level. Numbering is concordant with the prior studies. Alignment: Lumbar lordosis is maintained. Similar grade 1 anterolisthesis of L4 on L5. Vertebrae: Redemonstrated compression fracture of L1 with proximally 80% height loss centrally overall similar to prior approximately 4-5 mm retropulsion at the inferior endplate of L1 is also similar to prior. Additional compression fracture of L2 which demonstrates up to 60% height loss, slighlty increased along the anterior aspect compared to prior. Schmorl's nodes at multiple levels. Vertebral  body heights are otherwise maintained. Persistent edema within the L1 vertebral body which may be related to micro motion at the fracture site. There is slightly increased edema within the anterior aspect of the L2 vertebral body with slightly increased height loss anteriorly. No evidence of additional acute fracture. Conus medullaris and cauda equina: Conus extends to the L2 level. Conus and cauda equina appear normal. Paraspinal and other soft tissues: Mild paraspinal muscular atrophy. Similar appearance of bilateral renal cysts. Disc levels: T12-L1: Small disc bulge indents the ventral thecal sac without evidence of significant spinal canal stenosis on sagittal images.  Bilateral facet arthrosis. Mild foraminal stenosis on the left. L1-2: Retropulsion of fracture fragments at L1 along with disc bulge which indents the ventral thecal sac. Bilateral facet arthrosis indents the dorsal thecal sac. There is moderate to severe spinal canal stenosis which appears similar to prior. Similar compression of the conus medullaris and crowding of the cauda equina nerve roots. There is mild bilateral foraminal stenosis with partial contribution from retropulsion of fracture fragments overall similar to prior. L2-3: Small disc bulge moderate facet arthrosis. Similar mild spinal canal stenosis. There is mild bilateral neural foraminal stenosis. L3-4: Small disc bulge. Moderate bilateral facet arthrosis and thickening of the ligamentum flavum. No significant spinal canal stenosis. No significant foraminal stenosis. L4-5: Partial uncovering of the disc. Small disc bulge. Mild lateral recess narrowing. Moderate to severe facet arthrosis. Mild thickening of the ligamentum flavum. There is no significant spinal canal stenosis. No significant foraminal stenosis. L5-S1: Moderate disc height loss. Small right paracentral disc protrusion. Moderate bilateral facet arthrosis. Thickening of the ligamentum flavum. No significant spinal canal stenosis. There is mild foraminal stenosis on the left. IMPRESSION: Redemonstrated compression fractures of L1 and L2. Persistent edema at the L1 level may be related to micro-motion at the fracture site. Additionally there is increased edema anteriorly within the L2 vertebral body with increased height loss of L2 anteriorly. Degenerative changes as above overall similar to prior. Similar moderate to severe spinal canal stenosis at L1-2 secondary to retropulsion of fracture fragments and disc bulge. Similar mild compression of the conus medullaris. Electronically Signed   By: Donnice Mania M.D.   On: 01/10/2024 19:12   DG Lumbar Spine Complete Result Date:  01/10/2024 CLINICAL DATA:  Fall EXAM: LUMBAR SPINE - COMPLETE 4+ VIEW COMPARISON:  MRI lumbar spine 926 2025 FINDINGS: The bones are diffusely osteopenic. Compression deformities of L1 and L2 appear similar to the prior study. There is retropulsion of fracture fragments at L1, unchanged. No new fractures are seen. At L4-L5 there is 2 mm of anterolisthesis which is stable. There is severe disc space narrowing at L5-S1. Bilateral hip arthroplasties are present. IMPRESSION: 1. No acute fracture. 2. Stable compression deformities of L1 and L2. 3. Stable grade 1 anterolisthesis at L4-L5. Electronically Signed   By: Greig Pique M.D.   On: 01/10/2024 14:58    Microbiology: Results for orders placed or performed during the hospital encounter of 04/01/23  Resp panel by RT-PCR (RSV, Flu A&B, Covid) Anterior Nasal Swab     Status: Abnormal   Collection Time: 04/01/23 12:53 PM   Specimen: Anterior Nasal Swab  Result Value Ref Range Status   SARS Coronavirus 2 by RT PCR NEGATIVE NEGATIVE Final    Comment: (NOTE) SARS-CoV-2 target nucleic acids are NOT DETECTED.  The SARS-CoV-2 RNA is generally detectable in upper respiratory specimens during the acute phase of infection. The lowest concentration of SARS-CoV-2 viral copies this assay can  detect is 138 copies/mL. A negative result does not preclude SARS-Cov-2 infection and should not be used as the sole basis for treatment or other patient management decisions. A negative result may occur with  improper specimen collection/handling, submission of specimen other than nasopharyngeal swab, presence of viral mutation(s) within the areas targeted by this assay, and inadequate number of viral copies(<138 copies/mL). A negative result must be combined with clinical observations, patient history, and epidemiological information. The expected result is Negative.  Fact Sheet for Patients:  bloggercourse.com  Fact Sheet for Healthcare  Providers:  seriousbroker.it  This test is no t yet approved or cleared by the [place]  FDA and  has been authorized for detection and/or diagnosis of SARS-CoV-2 by FDA under an Emergency Use Authorization (EUA). This EUA will remain  in effect (meaning this test can be used) for the duration of the COVID-19 declaration under Section 564(b)(1) of the Act, 21 U.S.C.section 360bbb-3(b)(1), unless the authorization is terminated  or revoked sooner.       Influenza A by PCR POSITIVE (A) NEGATIVE Final   Influenza B by PCR NEGATIVE NEGATIVE Final    Comment: (NOTE) The Xpert Xpress SARS-CoV-2/FLU/RSV plus assay is intended as an aid in the diagnosis of influenza from Nasopharyngeal swab specimens and should not be used as a sole basis for treatment. Nasal washings and aspirates are unacceptable for Xpert Xpress SARS-CoV-2/FLU/RSV testing.  Fact Sheet for Patients: bloggercourse.com  Fact Sheet for Healthcare Providers: seriousbroker.it  This test is not yet approved or cleared by the [place]  FDA and has been authorized for detection and/or diagnosis of SARS-CoV-2 by FDA under an Emergency Use Authorization (EUA). This EUA will remain in effect (meaning this test can be used) for the duration of the COVID-19 declaration under Section 564(b)(1) of the Act, 21 U.S.C. section 360bbb-3(b)(1), unless the authorization is terminated or revoked.     Resp Syncytial Virus by PCR NEGATIVE NEGATIVE Final    Comment: (NOTE) Fact Sheet for Patients: bloggercourse.com  Fact Sheet for Healthcare Providers: seriousbroker.it  This test is not yet approved or cleared by the [place]  FDA and has been authorized for detection and/or diagnosis of SARS-CoV-2 by FDA under an Emergency Use Authorization (EUA). This EUA will remain in effect (meaning this test  can be used) for the duration of the COVID-19 declaration under Section 564(b)(1) of the Act, 21 U.S.C. section 360bbb-3(b)(1), unless the authorization is terminated or revoked.  Performed at [facility], [address], [place], [place] [address]    *Note: Due to a large number of results and/or encounters for the requested time period, some results have not been displayed. A complete set of results can be found in Results Review.    Labs: CBC: Recent Labs  Lab 01/10/24 1331 01/11/24 0500 01/12/24 0356  WBC 7.1 5.3 6.1  HGB 11.6* 9.8* 9.9*  HCT 34.7* 29.7* 29.7*  MCV 99.7 99.7 99.3  PLT 334 284 275   Basic Metabolic Panel: Recent Labs  Lab 01/10/24 1331 01/11/24 0500 01/12/24 0356 01/14/24 0525  NA 137 140 139 140  K 3.6 3.2* 3.6 3.3*  CL 104 106 107 104  CO2 21* 22 24 24   GLUCOSE 95 160* 106* 98  BUN 24* 17 10 9   CREATININE 1.21* 1.02* 0.81 0.75  CALCIUM  8.9 8.5* 8.9 8.9  MG  --  2.1  --   --    Liver Function Tests: Recent Labs  Lab 01/10/24 1331 01/11/24 0500  AST 36  26  ALT 27 19  ALKPHOS 72 62  BILITOT 0.8 0.6  PROT 6.9 5.7*  ALBUMIN 3.2* 2.8*   CBG: No results for input(s): GLUCAP in the last 168 hours.  Discharge time spent: greater than 30 minutes.  Signed: Burnard DELENA Cunning, DO [facility] 01/14/2024

## 2024-01-18 DIAGNOSIS — M51369 Other intervertebral disc degeneration, lumbar region without mention of lumbar back pain or lower extremity pain: Secondary | ICD-10-CM | POA: Diagnosis not present

## 2024-01-18 DIAGNOSIS — I10 Essential (primary) hypertension: Secondary | ICD-10-CM | POA: Diagnosis not present

## 2024-01-18 DIAGNOSIS — R42 Dizziness and giddiness: Secondary | ICD-10-CM | POA: Diagnosis not present

## 2024-01-18 DIAGNOSIS — F39 Unspecified mood [affective] disorder: Secondary | ICD-10-CM | POA: Diagnosis not present

## 2024-01-18 DIAGNOSIS — R296 Repeated falls: Secondary | ICD-10-CM | POA: Diagnosis not present

## 2024-01-18 DIAGNOSIS — S32020D Wedge compression fracture of second lumbar vertebra, subsequent encounter for fracture with routine healing: Secondary | ICD-10-CM | POA: Diagnosis not present

## 2024-01-19 ENCOUNTER — Ambulatory Visit: Attending: Cardiovascular Disease | Admitting: Cardiovascular Disease

## 2024-01-19 ENCOUNTER — Ambulatory Visit: Admitting: Physical Therapy

## 2024-01-19 ENCOUNTER — Encounter: Payer: Self-pay | Admitting: Cardiovascular Disease

## 2024-01-19 VITALS — BP 150/70 | HR 60 | Ht 65.0 in | Wt 199.0 lb

## 2024-01-19 DIAGNOSIS — I77810 Thoracic aortic ectasia: Secondary | ICD-10-CM | POA: Diagnosis not present

## 2024-01-19 DIAGNOSIS — I480 Paroxysmal atrial fibrillation: Secondary | ICD-10-CM | POA: Diagnosis not present

## 2024-01-19 DIAGNOSIS — E782 Mixed hyperlipidemia: Secondary | ICD-10-CM | POA: Insufficient documentation

## 2024-01-19 DIAGNOSIS — I10 Essential (primary) hypertension: Secondary | ICD-10-CM | POA: Insufficient documentation

## 2024-01-19 DIAGNOSIS — I2510 Atherosclerotic heart disease of native coronary artery without angina pectoris: Secondary | ICD-10-CM | POA: Diagnosis not present

## 2024-01-19 NOTE — Assessment & Plan Note (Signed)
 History of mildly elevated coronary calcium  score 12/31/2020 of 24 all in the RCA territory.  She did have a cardiac PET study performed 03/11/2023 which was low risk with a very small area of ischemia.  She denies chest pain.  She was noted to have mildly dilated ascending thoracic aorta measuring 42 mm.

## 2024-01-19 NOTE — Assessment & Plan Note (Signed)
 History of PAF status post A-fib ablation by Dr. Cindie 10/13/2022 with recurrent A-fib.  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  Dr. Cindie and her with shared decision making decided to withhold anticoagulation because of fall risk.
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
## 2024-01-19 NOTE — Assessment & Plan Note (Signed)
 History history of hyperlipidemia on statin therapy with lipid profile performed 01/29/2022 revealing total cholesterol 147, LDL of 70 and HDL of 61.

## 2024-01-19 NOTE — Assessment & Plan Note (Signed)
 Measured 42 mm by CT, not commented by 2D echo.  Given her age and relatively small degree of dilatation we will not further pursue.

## 2024-01-19 NOTE — Assessment & Plan Note (Signed)
 History of essential hypertension blood pressure measured today at 150/70.  She is on losartan  and metoprolol .

## 2024-01-19 NOTE — Patient Instructions (Signed)
 Medication Instructions:  Your physician recommends that you continue on your current medications as directed. Please refer to the Current Medication list given to you today.  *If you need a refill on your cardiac medications before your next appointment, please call your pharmacy*   Follow-Up: At [facility], you and your health needs are our priority.  As part of our continuing mission to provide you with exceptional heart care, our providers are all part of one team.  This team includes your primary Cardiologist (physician) and Advanced Practice Providers or APPs (Physician Assistants and Nurse Practitioners) who all work together to provide you with the care you need, when you need it.  Your next appointment:   6 month(s)  Provider:   Jon Hails, PA-C, Callie Goodrich, PA-C, Kathleen Johnson, PA-C, Hao Meng, PA-C, Damien Braver, NP, or Katlyn West, NP         Then, Dorn Lesches, MD will plan to see you again in 12 month(s).

## 2024-01-20 DIAGNOSIS — I10 Essential (primary) hypertension: Secondary | ICD-10-CM | POA: Diagnosis not present

## 2024-01-20 DIAGNOSIS — S32020D Wedge compression fracture of second lumbar vertebra, subsequent encounter for fracture with routine healing: Secondary | ICD-10-CM | POA: Diagnosis not present

## 2024-01-20 DIAGNOSIS — R296 Repeated falls: Secondary | ICD-10-CM | POA: Diagnosis not present

## 2024-01-20 DIAGNOSIS — M51369 Other intervertebral disc degeneration, lumbar region without mention of lumbar back pain or lower extremity pain: Secondary | ICD-10-CM | POA: Diagnosis not present

## 2024-01-20 DIAGNOSIS — M25531 Pain in right wrist: Secondary | ICD-10-CM | POA: Diagnosis not present

## 2024-01-20 DIAGNOSIS — R42 Dizziness and giddiness: Secondary | ICD-10-CM | POA: Diagnosis not present

## 2024-01-20 DIAGNOSIS — F39 Unspecified mood [affective] disorder: Secondary | ICD-10-CM | POA: Diagnosis not present

## 2024-01-21 ENCOUNTER — Other Ambulatory Visit (HOSPITAL_COMMUNITY): Payer: Self-pay | Admitting: Psychiatry

## 2024-01-21 ENCOUNTER — Ambulatory Visit: Admitting: Physical Therapy

## 2024-01-21 DIAGNOSIS — M51369 Other intervertebral disc degeneration, lumbar region without mention of lumbar back pain or lower extremity pain: Secondary | ICD-10-CM | POA: Diagnosis not present

## 2024-01-21 DIAGNOSIS — I10 Essential (primary) hypertension: Secondary | ICD-10-CM | POA: Diagnosis not present

## 2024-01-21 DIAGNOSIS — S32020D Wedge compression fracture of second lumbar vertebra, subsequent encounter for fracture with routine healing: Secondary | ICD-10-CM | POA: Diagnosis not present

## 2024-01-21 DIAGNOSIS — R42 Dizziness and giddiness: Secondary | ICD-10-CM | POA: Diagnosis not present

## 2024-01-21 DIAGNOSIS — F39 Unspecified mood [affective] disorder: Secondary | ICD-10-CM | POA: Diagnosis not present

## 2024-01-21 DIAGNOSIS — Z9181 History of falling: Secondary | ICD-10-CM | POA: Diagnosis not present

## 2024-01-21 DIAGNOSIS — R040 Epistaxis: Secondary | ICD-10-CM | POA: Diagnosis not present

## 2024-01-24 DIAGNOSIS — R040 Epistaxis: Secondary | ICD-10-CM | POA: Diagnosis not present

## 2024-01-24 DIAGNOSIS — F39 Unspecified mood [affective] disorder: Secondary | ICD-10-CM | POA: Diagnosis not present

## 2024-01-24 DIAGNOSIS — M51369 Other intervertebral disc degeneration, lumbar region without mention of lumbar back pain or lower extremity pain: Secondary | ICD-10-CM | POA: Diagnosis not present

## 2024-01-24 DIAGNOSIS — I10 Essential (primary) hypertension: Secondary | ICD-10-CM | POA: Diagnosis not present

## 2024-01-24 DIAGNOSIS — R42 Dizziness and giddiness: Secondary | ICD-10-CM | POA: Diagnosis not present

## 2024-01-26 ENCOUNTER — Ambulatory Visit: Admitting: Physical Therapy

## 2024-01-26 DIAGNOSIS — I10 Essential (primary) hypertension: Secondary | ICD-10-CM | POA: Diagnosis not present

## 2024-01-26 DIAGNOSIS — R040 Epistaxis: Secondary | ICD-10-CM | POA: Diagnosis not present

## 2024-01-26 DIAGNOSIS — F39 Unspecified mood [affective] disorder: Secondary | ICD-10-CM | POA: Diagnosis not present

## 2024-01-26 DIAGNOSIS — R42 Dizziness and giddiness: Secondary | ICD-10-CM | POA: Diagnosis not present

## 2024-01-26 DIAGNOSIS — M51369 Other intervertebral disc degeneration, lumbar region without mention of lumbar back pain or lower extremity pain: Secondary | ICD-10-CM | POA: Diagnosis not present

## 2024-01-27 ENCOUNTER — Ambulatory Visit: Payer: Medicare Other | Admitting: Hematology and Oncology

## 2024-01-28 ENCOUNTER — Ambulatory Visit: Admitting: Physical Therapy

## 2024-01-28 DIAGNOSIS — R040 Epistaxis: Secondary | ICD-10-CM | POA: Diagnosis not present

## 2024-01-28 DIAGNOSIS — R42 Dizziness and giddiness: Secondary | ICD-10-CM | POA: Diagnosis not present

## 2024-01-28 DIAGNOSIS — I10 Essential (primary) hypertension: Secondary | ICD-10-CM | POA: Diagnosis not present

## 2024-01-28 DIAGNOSIS — F39 Unspecified mood [affective] disorder: Secondary | ICD-10-CM | POA: Diagnosis not present

## 2024-01-28 DIAGNOSIS — M51369 Other intervertebral disc degeneration, lumbar region without mention of lumbar back pain or lower extremity pain: Secondary | ICD-10-CM | POA: Diagnosis not present

## 2024-02-01 ENCOUNTER — Ambulatory Visit: Payer: Self-pay

## 2024-02-01 DIAGNOSIS — M47812 Spondylosis without myelopathy or radiculopathy, cervical region: Secondary | ICD-10-CM | POA: Diagnosis not present

## 2024-02-01 DIAGNOSIS — L4052 Psoriatic arthritis mutilans: Secondary | ICD-10-CM | POA: Diagnosis not present

## 2024-02-01 DIAGNOSIS — G894 Chronic pain syndrome: Secondary | ICD-10-CM | POA: Diagnosis not present

## 2024-02-01 DIAGNOSIS — M150 Primary generalized (osteo)arthritis: Secondary | ICD-10-CM | POA: Diagnosis not present
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
## 2024-02-01 NOTE — Telephone Encounter (Signed)
2nd attempt. Left message.

## 2024-02-01 NOTE — Telephone Encounter (Signed)
 3rd attempt. Left message.

## 2024-02-02 ENCOUNTER — Ambulatory Visit: Admitting: Physical Therapy

## 2024-02-02 ENCOUNTER — Other Ambulatory Visit: Payer: Self-pay | Admitting: Adult Health

## 2024-02-02 ENCOUNTER — Ambulatory Visit: Admitting: Adult Health

## 2024-02-02 VITALS — BP 120/80 | HR 108 | Temp 98.5°F | Ht 65.0 in | Wt 195.0 lb

## 2024-02-02 DIAGNOSIS — R296 Repeated falls: Secondary | ICD-10-CM | POA: Diagnosis not present

## 2024-02-02 DIAGNOSIS — E876 Hypokalemia: Secondary | ICD-10-CM | POA: Diagnosis not present

## 2024-02-02 DIAGNOSIS — H9193 Unspecified hearing loss, bilateral: Secondary | ICD-10-CM | POA: Diagnosis not present

## 2024-02-02 DIAGNOSIS — N179 Acute kidney failure, unspecified: Secondary | ICD-10-CM

## 2024-02-02 DIAGNOSIS — M5416 Radiculopathy, lumbar region: Secondary | ICD-10-CM

## 2024-02-02 DIAGNOSIS — R2681 Unsteadiness on feet: Secondary | ICD-10-CM | POA: Diagnosis not present

## 2024-02-02 DIAGNOSIS — I10 Essential (primary) hypertension: Secondary | ICD-10-CM

## 2024-02-02 DIAGNOSIS — H8113 Benign paroxysmal vertigo, bilateral: Secondary | ICD-10-CM

## 2024-02-02 DIAGNOSIS — G4700 Insomnia, unspecified: Secondary | ICD-10-CM

## 2024-02-02 DIAGNOSIS — F411 Generalized anxiety disorder: Secondary | ICD-10-CM | POA: Diagnosis not present

## 2024-02-02 DIAGNOSIS — E782 Mixed hyperlipidemia: Secondary | ICD-10-CM

## 2024-02-02 LAB — COMPREHENSIVE METABOLIC PANEL WITH GFR
ALT: 10 U/L (ref 3–35)
AST: 14 U/L (ref 5–37)
Albumin: 4.2 g/dL (ref 3.5–5.2)
Alkaline Phosphatase: 96 U/L (ref 39–117)
BUN: 12 mg/dL (ref 6–23)
CO2: 25 meq/L (ref 19–32)
Calcium: 9.7 mg/dL (ref 8.4–10.5)
Chloride: 104 meq/L (ref 96–112)
Creatinine, Ser: 0.95 mg/dL (ref 0.40–1.20)
GFR: 57.02 mL/min — ABNORMAL LOW (ref >60.00)
Glucose, Bld: 114 mg/dL — ABNORMAL HIGH (ref 70–99)
Potassium: 3.5 meq/L (ref 3.5–5.1)
Sodium: 138 meq/L (ref 135–145)
Total Bilirubin: 0.7 mg/dL (ref 0.2–1.2)
Total Protein: 7.3 g/dL (ref 6.0–8.3)

## 2024-02-02 LAB — CBC
HCT: 35.6 % — ABNORMAL LOW (ref 36.0–46.0)
Hemoglobin: 12 g/dL (ref 12.0–15.0)
MCHC: 33.8 g/dL (ref 30.0–36.0)
MCV: 99.3 fl (ref 78.0–100.0)
Platelets: 305 K/uL (ref 150.0–400.0)
RBC: 3.59 Mil/uL — ABNORMAL LOW (ref 3.87–5.11)
RDW: 13.6 % (ref 11.5–15.5)
WBC: 7.3 K/uL (ref 4.0–10.5)

## 2024-02-02 NOTE — Progress Notes (Signed)
 Subjective:    Patient ID: Tonya Bartlett, female    DOB: 29-Oct-1945, 78 y.o.   MRN: [id number]  HPI 78 year old female who  has a past medical history of Alcohol abuse, Allergic rhinitis, Allergy, Anemia, Anxiety, Blood transfusion without reported diagnosis, Breast cancer (HCC) (12/2020), Cancer (HCC), Cataract, Clotting disorder, Colitis, Complication of anesthesia, Depression, Diverticulosis, Dysrhythmia, Fatigue, Frequency of urination, GERD (gastroesophageal reflux disease), History of colon polyps, History of DVT of lower extremity, History of hiatal hernia, History of iron deficiency anemia (1996), History of migraine headaches, History of radiation therapy, History of rib fracture, Hyperlipidemia, Hypertension, IBS (irritable bowel syndrome), Insomnia, Joint pain, MCI (mild cognitive impairment), Melanoma (HCC) (2019), Migraine headache, OA (osteoarthritis), Osteopenia, Personal history of radiation therapy, Pneumonia, PONV (postoperative nausea and vomiting), Recovering alcoholic (HCC), Right rotator cuff tear, SOB (shortness of breath) on exertion, Substance abuse (HCC), Swallowing difficulty, and Unspecified essential hypertension.  She presents to the office today for TCM visit, she is with her daughter today.   Admit Date 01/10/2024 Discharge Date 01/14/2024 Discharged from [facility] 01/29/2024  She presented to the emergency room for evaluation of multiple falls, found prior.  Patient reported that her falls were getting worse for the past few days prior to going to the emergency room she had hit her head multiple times when falling.  She reported having disequilibrium and left-sided lower extremity weakness that was causing a recurrent fall.  She also reported a history of lumbar radiculopathy been seen by neurosurgery in the past but is getting worse.  He was complaining about back pain that was getting worse since her multiple falls.  Hospital Course  AKI  - Initial creatinine  elevated at 1.21 with a GFR of 46.  Her AKI resolved with IV fluids and her creatinine was 0.75 on discharge  Hypokalemia  - resolved with replacement   Vertigo  - MRI of the brain with no acute intracranial abnormality.  Chronic microvascular ischemic changes noted.  She worked with vestibular physical therapy while admitted and still had intermittent dizziness/vertigo and PT recommended skilled nursing facility for rehab, patient was agreeable.  Chronic Lumbar Radiculopathy  - Redemonstrated compression fracture at L1-L2.  No acute issues noted. - She did work with OT/PT in the hospital and at rehab   GAD - Continued home regimen of bupropion  100 mg daily and effexor  150 mg daily   Recurrent Falls - PT/OT evaluation - SNF for rehab  - Treat for BPPV  Essential Hypertension  - She did have hypertensive urgency which resolved.  BP was elevated up to 196/90 while in the hospital, she denies symptoms.  Losartan  100 mg was resumed and her systolic improved to 140s to 150s.  Metoprolol  tartrate was increased from 25 mg to 50 mg twice daily.  Hyperlipidemia - Continued on Atorvastatin  40 mg daily.    Her daughter reports that while in skilled nursing facility he she stopped taking her hydrocodone  and is a completely new person.  She seems more lucid and not as tired.  She has not fallen since she has been discharged and is feeling well overall.  She did meet with her pain management doctor who advised her to stop the hydrocodone  for 4 to 6 months and then, she can take Tylenol  if she has any pain.  So far she has been doing well and her pain is managed.   She is working with a home health physical therapist with her personal goal of not having to  use a walker.    She did go look at some assisted living facilities and liked [facility], but is going to see how she does at home before she decides to move.  She is hopeful that getting off her pain medication will help prevent further  falls.  She also reports that while in the rehab facility they help her vertigo and she is no longer having any dizziness.  She is curious about titrating off some of her other psych meds  She does have chronic hearing loss and wears hearing aids but feels as though they do not fit well.  She would like a referral to an ear nose and throat physician that she has not seen in the past.  Review of Systems  Constitutional: Negative.   HENT:  Positive for hearing loss.   Eyes: Negative.   Respiratory: Negative.    Cardiovascular: Negative.   Gastrointestinal: Negative.   Endocrine: Negative.   Genitourinary: Negative.   Musculoskeletal:  Positive for arthralgias, back pain and gait problem.  Skin: Negative.   Allergic/Immunologic: Negative.   Hematological: Negative.    Past Medical History:  Diagnosis Date   Alcohol abuse    Sober 41 years.   Allergic rhinitis    Allergy    Anemia    Anxiety    Blood transfusion without reported diagnosis    Breast cancer (HCC) 12/2020   left   Cancer (HCC)    Cataract    bil cataracts removed   Clotting disorder    DVT- 1996 po knee replacement   Colitis    Complication of anesthesia    vomit x1 while on Morphine per pt   Depression    Diverticulosis    Dysrhythmia    Atrial fibrillation   Fatigue    Frequency of urination    GERD (gastroesophageal reflux disease)    History of colon polyps    History of DVT of lower extremity    POST LEFT TOTAL KNEE  1996   History of hiatal hernia    History of iron deficiency anemia 1996   iron infusion   History of migraine headaches    History of radiation therapy    left breast 03/27/2021-04/23/2021 Dr Lynwood Nasuti   History of rib fracture    Hyperlipidemia    Hypertension    IBS (irritable bowel syndrome)    Insomnia    Joint pain    MCI (mild cognitive impairment)    Melanoma (HCC) 2019   left leg    Migraine headache    hx of migraines    OA (osteoarthritis)    RIGHT SHOULDER    Osteopenia    Personal history of radiation therapy    Pneumonia    remote history   PONV (postoperative nausea and vomiting)    Recovering alcoholic (HCC)    SINCE 01-21-1980   Right rotator cuff tear    SOB (shortness of breath) on exertion    Substance abuse (HCC)    recovering alcoholic   Swallowing difficulty    Unspecified essential hypertension     Social History   Socioeconomic History   Marital status: Widowed    Spouse name: Not on file   Number of children: 3   Years of education: Not on file   Highest education level: Master's degree (e.g., MA, MS, MEng, MEd, MSW, MBA)  Occupational History   Occupation: retired paramedic  Tobacco Use   Smoking status: Former    Current packs/day:  0.00    Average packs/day: 0.5 packs/day for 15.0 years (7.5 ttl pk-yrs)    Types: Cigarettes    Start date: 05/16/1970    Quit date: 05/15/1985    Years since quitting: 38.7   Smokeless tobacco: Never   Tobacco comments:    Former smoker 12/10/22  Vaping Use   Vaping status: Never Used  Substance and Sexual Activity   Alcohol use: No    Comment: RECOVERING ALCOHOLIC--  QUIT [phone]   Drug use: No   Sexual activity: Not Currently    Birth control/protection: Post-menopausal  Other Topics Concern   Not on file  Social History Narrative   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    Widowed    Three kids    6 grandchildren       Social Drivers of Health   Tobacco Use: Medium Risk (01/19/2024)   Patient History    Smoking Tobacco Use: Former    Smokeless Tobacco Use: Never    Passive Exposure: Not on Actuary Strain: Low Risk (04/18/2021)   Overall Financial Resource Strain (CARDIA)    Difficulty of Paying Living Expenses: Not hard at all  Food Insecurity: No Food Insecurity (01/11/2024)   Epic    Worried About Programme Researcher, Broadcasting/film/video in the Last Year: Never true    Ran Out of Food in the Last Year: Never true   Transportation Needs: No Transportation Needs (01/11/2024)   Epic    Lack of Transportation (Medical): No    Lack of Transportation (Non-Medical): No  Physical Activity: Inactive (04/18/2021)   Exercise Vital Sign    Days of Exercise per Week: 0 days    Minutes of Exercise per Session: 0 min  Stress: No Stress Concern Present (04/18/2021)   [employer] of Occupational Health - Occupational Stress Questionnaire    Feeling of Stress : Not at all  Social Connections: Moderately Integrated (01/11/2024)   Social Connection and Isolation Panel    Frequency of Communication with Friends and Family: More than three times a week    Frequency of Social Gatherings with Friends and Family: More than three times a week    Attends Religious Services: More than 4 times per year    Active Member of [employer] or Organizations: Yes    Attends Banker Meetings: More than 4 times per year    Marital Status: Widowed  Intimate Partner Violence: Not At Risk (01/11/2024)   Epic    Fear of Current or Ex-Partner: No    Emotionally Abused: No    Physically Abused: No    Sexually Abused: No  Depression (PHQ2-9): Medium Risk (03/26/2023)   Depression (PHQ2-9)    PHQ-2 Score: 10  Alcohol Screen: Low Risk (04/18/2021)   Alcohol Screen    Last Alcohol Screening Score (AUDIT): 0  Housing: High Risk (01/11/2024)   Epic    Unable to Pay for Housing in the Last Year: Yes    Number of Times Moved in the Last Year: 0    Homeless in the Last Year: No  Utilities: Not At Risk (01/11/2024)   Epic    Threatened with loss of utilities: No  Health Literacy: Not on file    Past Surgical History:  Procedure Laterality Date   ATRIAL FIBRILLATION ABLATION N/A 10/13/2022   Procedure: ATRIAL FIBRILLATION ABLATION;  Surgeon: Cindie Ole DASEN, MD;  Location: [facility] INVASIVE CV LAB;  Service: Cardiovascular;  Laterality: N/A;  BREAST BIOPSY Left 01/06/2021   pos   BREAST LUMPECTOMY     BREAST LUMPECTOMY WITH  RADIOACTIVE SEED LOCALIZATION Left 02/27/2021   Procedure: LEFT BREAST LUMPECTOMY WITH RADIOACTIVE SEED LOCALIZATION;  Surgeon: Aron Shoulders, MD;  Location: [facility] OR;  Service: General;  Laterality: Left;   BUNIONECTOMY/  HAMMERTOE CORRECTION  RIGHT FOOT  2011   CATARACT EXTRACTION W/ INTRAOCULAR LENS  IMPLANT, BILATERAL     COLONOSCOPY     DILATION AND CURETTAGE OF UTERUS  1976   EYE SURGERY Bilateral 2016   cataract removal   KNEE ARTHROSCOPY W/ MENISCECTOMY Bilateral X2  LEFT /    X1  RIGHT   KNEE OPEN LATERAL RELEASE Bilateral    MOHS SURGERY Left 12/15/2017   Melanoma in situ - left calf - [facility]   ORIF ANKLE FRACTURE Right 08/04/2020   Procedure: OPEN REDUCTION INTERNAL FIXATION (ORIF) ANKLE FRACTURE;  Surgeon: Kit Rush, MD;  Location: [facility] ORS;  Service: Orthopedics;  Laterality: Right;  Mini C-arm, [employer] Small Frag   REPLACEMENT TOTAL KNEE Left 2006   SHOULDER ARTHROSCOPY WITH SUBACROMIAL DECOMPRESSION, ROTATOR CUFF REPAIR AND BICEP TENDON REPAIR Right 05/23/2013   Procedure: RIGHT SHOULDER ARTHROSCOPY EXAM UNDER ANESTHESIA  WITH SUBACROMIAL DECOMPRESSION,DISTAL CLAVICLE RESECTION, SADLABRAL DEBRIDEMENT CHONDROPLASTY, BICEP TENOTOMY ;  Surgeon: Lamar Collet, MD;  Location: [facility];  Service: Orthopedics;  Laterality: Right;   TONSILLECTOMY AND ADENOIDECTOMY  AGE 45   TOTAL HIP ARTHROPLASTY Left 05/04/2017   Procedure: LEFT TOTAL HIP ARTHROPLASTY ANTERIOR APPROACH;  Surgeon: Ernie Cough, MD;  Location: [facility] ORS;  Service: Orthopedics;  Laterality: Left;   TOTAL HIP ARTHROPLASTY Right 08/01/2019   Procedure: TOTAL HIP ARTHROPLASTY ANTERIOR APPROACH;  Surgeon: Ernie Cough, MD;  Location: [facility];  Service: Orthopedics;  Laterality: Right;    TOTAL KNEE ARTHROPLASTY Bilateral LEFT  1996/   RIGHT 2004   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      UPPER GASTROINTESTINAL ENDOSCOPY     UPPER GI ENDOSCOPY     VAGINAL HYSTERECTOMY  1976    Family  History  Problem Relation Age of Onset   Cancer Mother    Heart disease Mother    Hypertension Mother    Melanoma Mother 80   Obesity Mother    Heart disease Father 54   Hypertension Father    Esophageal cancer Brother    Dementia Paternal Grandfather    Melanoma Niece 72   Colon cancer Neg Hx    Colon polyps Neg Hx    Stomach cancer Neg Hx    Rectal cancer Neg Hx     Allergies[1]  Medications Ordered Prior to Encounter[2]  BP 120/80   Pulse (!) 108   Temp 98.5 F (36.9 C) (Oral)   Ht 5' 5 (1.651 m)   Wt 195 lb (88.5 kg)   SpO2 98%   BMI 32.45 kg/m       Objective:   Physical Exam Vitals and nursing note reviewed.  Constitutional:      Appearance: Normal appearance.  Cardiovascular:     Rate and Rhythm: Normal rate and regular rhythm.     Pulses: Normal pulses.     Heart sounds: Normal heart sounds.  Pulmonary:     Effort: Pulmonary effort is normal.     Breath sounds: Normal breath sounds.  Musculoskeletal:        General: Normal range of motion.  Skin:    General: Skin is warm and dry.  Capillary Refill: Capillary refill takes less than 2 seconds.  Neurological:     General: No focal deficit present.     Mental Status: She is oriented to person, place, and time.     Gait: Gait abnormal.  Psychiatric:        Mood and Affect: Mood normal.        Behavior: Behavior normal.        Thought Content: Thought content normal.        Judgment: Judgment normal.        Assessment & Plan:  1. AKI (acute kidney injury) (Primary) - Reviewed hospital notes, discharge instructions, labs, imaging and medication changes with the patient and her daughter. All questions answered to the best of my ability  - CBC; Future - Comprehensive metabolic panel with GFR; Future - Comprehensive metabolic panel with GFR - CBC  2. Hypokalemia  - CBC; Future - Comprehensive metabolic panel with GFR; Future - Comprehensive metabolic panel with GFR - CBC  3. Benign  paroxysmal positional vertigo due to bilateral vestibular disorder - Seems to have resolved. Can use Meclizine  PRN  - CBC; Future - Comprehensive metabolic panel with GFR; Future - Comprehensive metabolic panel with GFR - CBC  4. Lumbar radiculopathy, chronic - She does seem more lucid since not taking her narcotic pain medication  - Continue with tylenol  as needed - CBC; Future - Comprehensive metabolic panel with GFR; Future - Comprehensive metabolic panel with GFR - CBC  5. GAD (generalized anxiety disorder) - I advised her to talk to her psychiatrist about safely lowering her medication dosage - CBC; Future - Comprehensive metabolic panel with GFR; Future - Comprehensive metabolic panel with GFR - CBC  6. Recurrent falls - She has not had any falls since being discharged.  - Continue to work with PT - CBC; Future - Comprehensive metabolic panel with GFR; Future - Comprehensive metabolic panel with GFR - CBC  7. Essential hypertension - Controlled. No change in medication  - CBC; Future - Comprehensive metabolic panel with GFR; Future - Comprehensive metabolic panel with GFR - CBC  8. Mixed hyperlipidemia - Continue with Lipitor  - CBC; Future - Comprehensive metabolic panel with GFR; Future - Comprehensive metabolic panel with GFR - CBC  9. Bilateral hearing loss, unspecified hearing loss type - Will refer to ENT  - CBC; Future - Comprehensive metabolic panel with GFR; Future - Comprehensive metabolic panel with GFR - CBC  10. Insomnia, unspecified type - I am ok with her cutting her Trazodone  in half to see how she responds   11. Gait instability - Continue to work with PT    Darleene Shape, NP       [1]  Allergies Allergen Reactions   Cuprimine [Penicillamine] Anaphylaxis   Nsaids Anaphylaxis and Other (See Comments)    Severe stomach cramping Mouth sores   Otezla [Apremilast] Anaphylaxis    Suicidal ideation   Penicillins Anaphylaxis    Erythromycin Other (See Comments)    SEVERE STOMACH CRAMPS   Morphine And Codeine Nausea And Vomiting   Remicade  [Infliximab ] Other (See Comments) and Hypertension    Shut down immune system     Skyrizi [Risankizumab] Other (See Comments) and Cough    Upper respiratory infection Urinary tract infection Fever   Tolectin [Tolmetin] Other (See Comments)    Severe stomach cramping   Nickel Rash    Including snaps on hospital gowns   [2]  Current Outpatient Medications on File Prior to Visit  Medication Sig Dispense Refill   acetaminophen  (TYLENOL ) 325 MG tablet Take 2 tablets (650 mg total) by mouth every 4 (four) hours as needed for mild pain (pain score 1-3) (or Fever >/= 101).     albuterol  (VENTOLIN  HFA) 108 (90 Base) MCG/ACT inhaler INHALE 2 PUFFS INTO THE LUNGS EVERY 6 (SIX) HOURS AS NEEDED FOR WHEEZING OR SHORTNESS OF BREATH (COUGH ). START WITH USING IN AM, AROUND NOON AND 1 HOUR BEFORE BEDTIME TO HELP COUGH SYMPTOMS. 8.5 each 1   atorvastatin  (LIPITOR) 40 MG tablet TAKE 1 TABLET BY MOUTH EVERY DAY 90 tablet 1   buPROPion  (WELLBUTRIN  SR) 150 MG 12 hr tablet TAKE 1 TABLET BY MOUTH EVERY DAY 90 tablet 0   cetirizine (ZYRTEC) 10 MG tablet Take 10 mg by mouth daily.     losartan  (COZAAR ) 100 MG tablet TAKE 1 TABLET BY MOUTH EVERY DAY 90 tablet 3   meclizine  (ANTIVERT ) 12.5 MG tablet Take 1 tablet (12.5 mg total) by mouth 3 (three) times daily.     metoprolol  tartrate (LOPRESSOR ) 50 MG tablet Take 1 tablet (50 mg total) by mouth 2 (two) times daily.     omeprazole  (PRILOSEC) 40 MG capsule Take 1 capsule (40 mg total) by mouth 2 (two) times daily. 180 capsule 3   sulfamethoxazole -trimethoprim  (BACTRIM ,SEPTRA ) 400-80 MG tablet Take 1 tablet by mouth at bedtime.     SUMAtriptan  (IMITREX ) 100 MG tablet TAKE 1 TABLET (100 MG TOTAL) BY MOUTH EVERY 2 (TWO) HOURS AS NEEDED FOR MIGRAINE. MAY REPEAT IN 2 HOURS IF HEADACHE PERSISTS OR RECURS. 10 tablet 2   traZODone  (DESYREL ) 50 MG tablet TAKE 1  TABLET BY MOUTH EVERYDAY AT BEDTIME 90 tablet 1   venlafaxine  XR (EFFEXOR -XR) 150 MG 24 hr capsule Take 1 capsule (150 mg total) by mouth daily. 90 capsule 0   No current facility-administered medications on file prior to visit.

## 2024-02-02 NOTE — Patient Instructions (Signed)
 It was great seeing you today   We will follow up with you regarding your lab work   Please let me know if you need anything   You can cut back on the trazodone  by cutting it in half.

## 2024-02-03 ENCOUNTER — Ambulatory Visit: Payer: Self-pay | Admitting: Adult Health

## 2024-02-04 ENCOUNTER — Telehealth: Payer: Self-pay | Admitting: *Deleted

## 2024-02-04 ENCOUNTER — Ambulatory Visit: Admitting: Physical Therapy

## 2024-02-04 NOTE — Telephone Encounter (Signed)
 Copied from CRM [id number]. Topic: Clinical - Lab/Test Results >> Feb 04, 2024 12:17 PM Delon DASEN wrote: Reason for CRM: returned call about labs- gave message verbatim,  no questions

## 2024-02-04 NOTE — Telephone Encounter (Signed)
 Noted

## 2024-02-08 ENCOUNTER — Other Ambulatory Visit: Payer: Self-pay | Admitting: Adult Health

## 2024-02-08 ENCOUNTER — Telehealth: Payer: Self-pay | Admitting: Cardiovascular Disease

## 2024-02-08 NOTE — Telephone Encounter (Signed)
 Caller Industrial/product Designer) is following up on the status of patient clearance.

## 2024-02-09 NOTE — Telephone Encounter (Signed)
" °  ° °  Primary Cardiologist: Dorn Lesches, MD  Chart reviewed as part of pre-operative protocol coverage. Given past medical history and time since last visit, based on ACC/AHA guidelines, Tonya Bartlett would be at acceptable risk for the planned procedure without further cardiovascular testing.   Per Dr.Berry low risk for cervical spine surgery.  She was recently seen and evaluated in the cardiology clinic on 01/19/2024.  I will route this recommendation to the requesting party via Epic fax function and remove from pre-op  pool.  Please call with questions.  Tonya Bartlett. Tonya Guedes NP-C    02/09/2024, 1:07 PM [facility] [address] [place], [place] [address] Office [phone]      "

## 2024-02-09 NOTE — Telephone Encounter (Signed)
 Dr.Berry,  Ms.Both is requesting preoperative cardiac evaluation for ACDF C4-C5.  She was recently seen by you in clinic on 01/19/2024.  Her PMH includes paroxysmal atrial fibrillation, mixed hyperlipidemia, hypertension, and ascending aortic dilation.  During her last visit he reported that she had been having more frequent falls.  She denied chest pain and shortness of breath.  She is residing at [facility].  Would you be able to comment on cardiac risk for planned procedure?  Please direct your response to CV DIV preop pool.  Thank you for your help.  Tonya Bartlett. Tonya Dockendorf NP-C     02/09/2024, 8:03 AM [facility] [address] [place], [place] [address] Office [phone]

## 2024-02-21 ENCOUNTER — Inpatient Hospital Stay: Attending: Hematology and Oncology | Admitting: Hematology and Oncology

## 2024-02-21 VITALS — BP 157/72 | HR 60 | Temp 97.6°F | Resp 18 | Ht 65.0 in | Wt 196.5 lb

## 2024-02-21 DIAGNOSIS — D0512 Intraductal carcinoma in situ of left breast: Secondary | ICD-10-CM | POA: Diagnosis not present

## 2024-02-21 DIAGNOSIS — Z86000 Personal history of in-situ neoplasm of breast: Secondary | ICD-10-CM | POA: Insufficient documentation

## 2024-02-21 DIAGNOSIS — Z923 Personal history of irradiation: Secondary | ICD-10-CM | POA: Insufficient documentation

## 2024-02-21 NOTE — Progress Notes (Signed)
 "  Patient Care Team: Merna Huxley, NP as PCP - General (Family Medicine) Court Dorn PARAS, MD as PCP - Cardiology (Cardiology) Cindie Ole DASEN, MD as PCP - Electrophysiology (Cardiology) Ceil Anes, MD (Inactive) as Consulting Physician (Urology) Orth[facility], [facility] (Specialist) Liane Sharyne MATSU, [facility] (Inactive) as Pharmacist (Pharmacist) Tyree Nanetta SAILOR, RN as Oncology Nurse Navigator Aron Shoulders, MD as Consulting Physician (General Surgery) Odean Potts, MD as Consulting Physician (Hematology and Oncology) Shannon Agent, MD as Consulting Physician (Radiation Oncology)  DIAGNOSIS:  Encounter Diagnosis  Name Primary?   Ductal carcinoma in situ (DCIS) of left breast Yes    SUMMARY OF ONCOLOGIC HISTORY: Oncology History  Ductal carcinoma in situ (DCIS) of left breast  01/06/2021 Initial Diagnosis   Diagnostic mammogram: indeterminate calcifications in the upper outer left breast. Biopsy: DCIS and necrosis and calcifications, ER+(100%)/PR+(70%).    01/15/2021 Cancer Staging   Staging form: Breast, AJCC 8th Edition - Clinical stage from 01/15/2021: Stage 0 (cTis (DCIS), cN0, cM0, ER+, PR+, HER2: Not Assessed) - Signed by Odean Potts, MD on 01/15/2021 Stage prefix: Initial diagnosis    Genetic Testing   [facility] CancerNext-Expanded Panel is Negative. Report date is 01/28/2021.  The CancerNext-Expanded gene panel offered by [facility] and includes sequencing, rearrangement, and RNA analysis for the following 77 genes: AIP, ALK, APC, ATM, AXIN2, BAP1, BARD1, BLM, BMPR1A, BRCA1, BRCA2, BRIP1, CDC73, CDH1, CDK4, CDKN1B, CDKN2A, CHEK2, CTNNA1, DICER1, FANCC, FH, FLCN, GALNT12, KIF1B, LZTR1, MAX, MEN1, MET, MLH1, MSH2, MSH3, MSH6, MUTYH, NBN, NF1, NF2, NTHL1, PALB2, PHOX2B, PMS2, POT1, PRKAR1A, PTCH1, PTEN, RAD51C, RAD51D, RB1, RECQL, RET, SDHA, SDHAF2, SDHB, SDHC, SDHD, SMAD4, SMARCA4, SMARCB1, SMARCE1, STK11, SUFU, TMEM127, TP53, TSC1, TSC2, VHL and XRCC2 (sequencing and  deletion/duplication); EGFR, EGLN1, HOXB13, KIT, MITF, PDGFRA, POLD1, and POLE (sequencing only); EPCAM and GREM1 (deletion/duplication only).    02/27/2021 Surgery   BREAST, LEFT, LUMPECTOMY:  - Ductal carcinoma in situ, 0.8 cm in maximal dimension.  - Tumor approaches to 0.5 cm of closest (deep) resection margin.    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 She is now off endocrine therapy and is due for annual screening mammography.  She notes persistent left breast asymmetry with the left breast smaller and firmer than the right.  Over the past eight months she has had significant dizziness with recurrent falls, starting with a fall in February 2025 that caused three vertebral compression fractures requiring hospitalization and rehabilitation, and a subsequent wrist fracture. She uses a walker for mobility due to ongoing dizziness and unsteadiness. She stopped Eliquis  after prolonged bleeding from scalp trauma from falls on the advice of her physicians. Neurology is evaluating  the dizziness and falls, with brain imaging completed and further workup  pending.       ALLERGIES:  is allergic to cuprimine [penicillamine], nsaids, otezla [apremilast], penicillins, erythromycin, morphine and codeine, remicade  [infliximab ], skyrizi [risankizumab], tolectin [tolmetin], and nickel.  MEDICATIONS:  Current Outpatient Medications  Medication Sig Dispense Refill   acetaminophen  (TYLENOL ) 325 MG tablet Take 2 tablets (650 mg total) by mouth every 4 (four) hours as needed for mild pain (pain score 1-3) (or Fever >/= 101).     albuterol  (VENTOLIN  HFA) 108 (90 Base) MCG/ACT inhaler INHALE 2 PUFFS INTO THE LUNGS EVERY 6 (SIX) HOURS AS NEEDED FOR WHEEZING OR SHORTNESS OF BREATH (COUGH ). START WITH USING IN AM, AROUND NOON AND 1 HOUR BEFORE BEDTIME TO HELP COUGH SYMPTOMS. 8.5 each 1   atorvastatin  (LIPITOR) 40 MG tablet TAKE 1 TABLET BY MOUTH EVERY DAY 90 tablet 1   buPROPion  (WELLBUTRIN  SR) 150 MG 12 hr tablet TAKE 1 TABLET BY MOUTH EVERY DAY 90 tablet 0   cetirizine (ZYRTEC) 10 MG tablet Take 10 mg by mouth daily.     losartan  (COZAAR ) 100 MG tablet TAKE 1 TABLET BY MOUTH EVERY DAY 90 tablet 3   meclizine  (ANTIVERT ) 12.5 MG tablet Take 1 tablet (12.5 mg total) by mouth 3 (three) times daily.     metoprolol  tartrate (LOPRESSOR ) 50 MG tablet Take 1 tablet (50 mg total) by mouth 2 (two) times daily.     omeprazole  (PRILOSEC) 40 MG capsule Take 1 capsule (40 mg total) by mouth 2 (two) times daily. 180 capsule 3   sulfamethoxazole -trimethoprim  (BACTRIM ,SEPTRA ) 400-80 MG tablet Take 1 tablet by mouth at bedtime.     SUMAtriptan  (IMITREX ) 100 MG tablet TAKE 1 TABLET (100 MG TOTAL) BY MOUTH EVERY 2 (TWO) HOURS AS NEEDED FOR MIGRAINE. MAY REPEAT IN 2 HOURS IF HEADACHE PERSISTS OR RECURS. 10 tablet 2   traZODone  (DESYREL ) 50 MG tablet TAKE 1 TABLET BY MOUTH EVERYDAY AT BEDTIME 90 tablet 1   venlafaxine  XR (EFFEXOR -XR) 150 MG 24 hr capsule Take 1 capsule (150 mg total) by mouth daily. 90 capsule 0   No current facility-administered medications for this visit.     PHYSICAL EXAMINATION: ECOG PERFORMANCE STATUS: 1 - Symptomatic but completely ambulatory  Vitals:   02/21/24 1435  BP: (!) 157/72  Pulse: 60  Resp: 18  Temp: 97.6 F (36.4 C)  SpO2: 100%   Filed Weights   02/21/24 1435  Weight: 196 lb 8 oz (89.1 kg)    LABORATORY DATA:  I have reviewed the data as listed    Latest Ref Rng & Units 02/02/2024    2:35 PM 01/14/2024    5:25 AM 01/12/2024    3:56 AM  CMP  Glucose 70 - 99 mg/dL 885  98  893   BUN 6 - 23 mg/dL 12  9  10    Creatinine 0.40 - 1.20 mg/dL 9.04  9.24  9.18   Sodium 135 - 145 mEq/L 138  140  139   Potassium 3.5 - 5.1 mEq/L 3.5  3.3  3.6   Chloride 96 - 112 mEq/L 104  104  107   CO2 19 - 32 mEq/L 25  24  24    Calcium  8.4 - 10.5 mg/dL 9.7  8.9  8.9   Total Protein 6.0 - 8.3 g/dL 7.3     Total Bilirubin 0.2 - 1.2 mg/dL 0.7     Alkaline Phos 39 - 117 U/L 96     AST 5 -  37 U/L 14     ALT 3 - 35 U/L 10       Lab Results  Component Value Date   WBC 7.3 02/02/2024   HGB 12.0 02/02/2024   HCT 35.6 (L) 02/02/2024   MCV 99.3 02/02/2024   PLT 305.0 02/02/2024   NEUTROABS 9.6 (H) 09/22/2023    ASSESSMENT & PLAN:  Ductal carcinoma in situ (DCIS) of left breast 01/06/2021:Diagnostic mammogram: indeterminate calcifications in the upper outer left breast. Biopsy: DCIS and necrosis and calcifications, ER+(100%)/PR+(70%).    02/27/2021: Left lumpectomy: Grade 2 DCIS 0.8 cm, margins negative, ER 100%, PR 70%    Treatment plan: 1. adjuvant radiation therapy to be completed 04/25/2021 2. Followed by antiestrogen therapy with tamoxifen  5 years started 04/30/2021, patient quit taking it June 2025 due to severe hot flashes    Breast cancer surveillance: Encouraged the patient to schedule her mammogram.  Dizziness and frequent falls: Seeing specialists.  She has neurology appointment.   Hospitalization 01/10/2024-01/14/2024: Acute kidney injury    Follow-up on an as-needed basis    No orders of the defined types  were placed in this encounter.  The patient has a good understanding of the overall plan. she agrees with it. she will call with any problems that may develop before the next visit here.  I personally spent a total of 30 minutes in the care of the patient today including preparing to see the patient, getting/reviewing separately obtained history, performing a medically appropriate exam/evaluation, counseling and educating, placing orders, referring and communicating with other health care professionals, documenting clinical information in the EHR, independently interpreting results, communicating results, and coordinating care.   Viinay K Krizia Flight, MD 02/21/2024    "

## 2024-02-21 NOTE — Assessment & Plan Note (Signed)
 01/06/2021:Diagnostic mammogram: indeterminate calcifications in the upper outer left breast. Biopsy: DCIS and necrosis and calcifications, ER+(100%)/PR+(70%).    02/27/2021: Left lumpectomy: Grade 2 DCIS 0.8 cm, margins negative, ER 100%, PR 70%    Treatment plan: 1. adjuvant radiation therapy to be completed 04/25/2021 2. Followed by antiestrogen therapy with tamoxifen  5 years started 04/30/2021   Tamoxifen  toxicities: Severe hot flashes: we reduced the dosage of tamoxifen  to 10 mg at bedtime.   Breast cancer surveillance: Breast exam 02/21/2024: Benign Mammogram will need to be repeated   Hospitalization 01/10/2024-01/14/2024: Acute kidney injury    Follow-up in 1 year.

## 2024-03-10 ENCOUNTER — Ambulatory Visit

## 2024-03-10 ENCOUNTER — Telehealth: Payer: Self-pay

## 2024-03-10 VITALS — Ht 65.0 in | Wt 196.0 lb

## 2024-03-10 DIAGNOSIS — Z0000 Encounter for general adult medical examination without abnormal findings: Secondary | ICD-10-CM | POA: Diagnosis not present

## 2024-03-10 DIAGNOSIS — Z1231 Encounter for screening mammogram for malignant neoplasm of breast: Secondary | ICD-10-CM

## 2024-03-10 MED ORDER — MECLIZINE HCL 12.5 MG PO TABS: 12.5000 mg | ORAL_TABLET | Freq: Three times a day (TID) | ORAL | 0 refills | Status: AC

## 2024-03-10 NOTE — Patient Instructions (Addendum)
 Tonya Bartlett,  Thank you for taking the time for your Medicare Wellness Visit. I appreciate your continued commitment to your health goals. Please review the care plan we discussed, and feel free to reach out if I can assist you further.  Please note that Annual Wellness Visits do not include a physical exam. Some assessments may be limited, especially if the visit was conducted virtually. If needed, we may recommend an in-person follow-up with your provider.  Ongoing Care Seeing your primary care provider every 3 to 6 months helps us  monitor your health and provide consistent, personalized care.   Referrals If a referral was made during today's visit and you haven't received any updates within two weeks, please contact the referred provider directly to check on the status.  Recommended Screenings:  Health Maintenance  Topic Date Due   Flu Shot  09/17/2023   COVID-19 Vaccine (6 - 2025-26 season) 10/18/2023   Breast Cancer Screening  02/08/2024   Colon Cancer Screening  11/18/2024   Medicare Annual Wellness Visit  03/10/2025   DTaP/Tdap/Td vaccine (3 - Td or Tdap) 08/01/2030   Pneumococcal Vaccine for age over 42  Completed   Osteoporosis screening with Bone Density Scan  Completed   Hepatitis C Screening  Completed   Zoster (Shingles) Vaccine  Completed   Meningitis B Vaccine  Aged Out       03/10/2024    8:10 AM  Advanced Directives  Does Patient Have a Medical Advance Directive? Yes  Type of Estate Agent of [place];Living will  Does patient want to make changes to medical advance directive? No - Patient declined  Copy of Healthcare Power of Attorney in Chart? Yes - validated most recent copy scanned in chart (See row information)    Vision: Annual vision screenings are recommended for early detection of glaucoma, cataracts, and diabetic retinopathy. These exams can also reveal signs of chronic conditions such as diabetes and high blood pressure.  Dental:  Annual dental screenings help detect early signs of oral cancer, gum disease, and other conditions linked to overall health, including heart disease and diabetes.  Please see the attached documents for additional preventive care recommendations.

## 2024-03-10 NOTE — Progress Notes (Signed)
 "  Chief Complaint  Patient presents with   Medicare Wellness     Subjective:   Tonya Bartlett is a 79 y.o. female who presents for a Medicare Annual Wellness Visit.  Visit info / Clinical Intake: Medicare Wellness Visit Type:: Subsequent Annual Wellness Visit Persons participating in visit and providing information:: patient Medicare Wellness Visit Mode:: Telephone If telephone:: video error Since this visit was completed virtually, some vitals may be partially provided or unavailable. Missing vitals are due to the limitations of the virtual format.: Documented vitals are patient reported If Telephone or Video please confirm:: I connected with patient using audio/video enable telemedicine. I verified patient identity with two identifiers, discussed telehealth limitations, and patient agreed to proceed. Patient Location:: Home Provider Location:: Office Interpreter Needed?: No Pre-visit prep was completed: yes AWV questionnaire completed by patient prior to visit?: no Living arrangements:: (!) lives alone Patient's Overall Health Status Rating: good Typical amount of pain: some Does pain affect daily life?: no Are you currently prescribed opioids?: no  Dietary Habits and Nutritional Risks How many meals a day?: 3 Eats fruit and vegetables daily?: yes Most meals are obtained by: preparing own meals In the last 2 weeks, have you had any of the following?: none Diabetic:: no  Functional Status Activities of Daily Living (to include ambulation/medication): Independent Ambulation: Independent with device- listed below Home Assistive Devices/Equipment: Eyeglasses; Other (Comment); Walker (specify Type); Cane (Hearing Aids) Medication Administration: Independent Home Management (perform basic housework or laundry): Independent Manage your own finances?: yes Primary transportation is: driving Concerns about vision?: no *vision screening is required for WTM* Concerns about  hearing?: (!) yes Uses hearing aids?: (!) yes Hear whispered voice?: (!) no *in-person visit only*  Fall Screening Falls in the past year?: 1 Number of falls in past year: 1 Was there an injury with Fall?: 1 (Fx rt hand. Followed by medical attention) Fall Risk Category Calculator: 3 Patient Fall Risk Level: High Fall Risk  Fall Risk Patient at Risk for Falls Due to: History of fall(s) Fall risk Follow up: Falls evaluation completed; Education provided  Home and Transportation Safety: All rugs have non-skid backing?: N/A, no rugs All stairs or steps have railings?: yes Grab bars in the bathtub or shower?: yes Have non-skid surface in bathtub or shower?: yes Good home lighting?: yes Regular seat belt use?: yes Hospital stays in the last year:: (!) yes How many hospital stays:: 1 Reason: Back surgery  Cognitive Assessment Difficulty concentrating, remembering, or making decisions? : yes Will 6CIT or Mini Cog be Completed: yes 6CIT or Mini Cog Declined: patient alert, oriented, able to answer questions appropriately and recall recent events What year is it?: 0 points What month is it?: 0 points Give patient an address phrase to remember (5 components): [address] [place]  About what time is it?: 0 points Count backwards from 20 to 1: 0 points Say the months of the year in reverse: 0 points Repeat the address phrase from earlier: 0 points 6 CIT Score: 0 points  Advance Directives (For Healthcare) Does Patient Have a Medical Advance Directive?: Yes Does patient want to make changes to medical advance directive?: No - Patient declined Type of Advance Directive: Healthcare Power of [place]; Living will Copy of Healthcare Power of Attorney in Chart?: Yes - validated most recent copy scanned in chart (See row information) Copy of Living Will in Chart?: Yes - validated most recent copy scanned in chart (See row information) Would patient like information on  creating a  medical advance directive?: No - Patient declined  Reviewed/Updated  Reviewed/Updated: Reviewed All (Medical, Surgical, Family, Medications, Allergies, Care Teams, Patient Goals)    Allergies (verified) Cuprimine [penicillamine], Nsaids, Otezla [apremilast], Penicillins, Erythromycin, Morphine and codeine, Remicade  [infliximab ], Skyrizi [risankizumab], Tolectin [tolmetin], and Nickel   Current Medications (verified) Outpatient Encounter Medications as of 03/10/2024  Medication Sig   acetaminophen  (TYLENOL ) 325 MG tablet Take 2 tablets (650 mg total) by mouth every 4 (four) hours as needed for mild pain (pain score 1-3) (or Fever >/= 101).   albuterol  (VENTOLIN  HFA) 108 (90 Base) MCG/ACT inhaler INHALE 2 PUFFS INTO THE LUNGS EVERY 6 (SIX) HOURS AS NEEDED FOR WHEEZING OR SHORTNESS OF BREATH (COUGH ). START WITH USING IN AM, AROUND NOON AND 1 HOUR BEFORE BEDTIME TO HELP COUGH SYMPTOMS.   atorvastatin  (LIPITOR) 40 MG tablet TAKE 1 TABLET BY MOUTH EVERY DAY   buPROPion  (WELLBUTRIN  SR) 150 MG 12 hr tablet TAKE 1 TABLET BY MOUTH EVERY DAY   cetirizine (ZYRTEC) 10 MG tablet Take 10 mg by mouth daily.   losartan  (COZAAR ) 100 MG tablet TAKE 1 TABLET BY MOUTH EVERY DAY   meclizine  (ANTIVERT ) 12.5 MG tablet Take 1 tablet (12.5 mg total) by mouth 3 (three) times daily.   metoprolol  tartrate (LOPRESSOR ) 50 MG tablet Take 1 tablet (50 mg total) by mouth 2 (two) times daily.   omeprazole  (PRILOSEC) 40 MG capsule Take 1 capsule (40 mg total) by mouth 2 (two) times daily.   sulfamethoxazole -trimethoprim  (BACTRIM ,SEPTRA ) 400-80 MG tablet Take 1 tablet by mouth at bedtime.   SUMAtriptan  (IMITREX ) 100 MG tablet TAKE 1 TABLET (100 MG TOTAL) BY MOUTH EVERY 2 (TWO) HOURS AS NEEDED FOR MIGRAINE. MAY REPEAT IN 2 HOURS IF HEADACHE PERSISTS OR RECURS.   traZODone  (DESYREL ) 50 MG tablet TAKE 1 TABLET BY MOUTH EVERYDAY AT BEDTIME   venlafaxine  XR (EFFEXOR -XR) 150 MG 24 hr capsule Take 1 capsule (150 mg total) by mouth  daily.   No facility-administered encounter medications on file as of 03/10/2024.    History: Past Medical History:  Diagnosis Date   Alcohol abuse    Sober 41 years.   Allergic rhinitis    Allergy    Anemia    Anxiety    Blood transfusion without reported diagnosis    Breast cancer (HCC) 12/2020   left   Cancer (HCC)    Cataract    bil cataracts removed   Clotting disorder    DVT- 1996 po knee replacement   Colitis    Complication of anesthesia    vomit x1 while on Morphine per pt   Depression    Diverticulosis    Dysrhythmia    Atrial fibrillation   Fatigue    Frequency of urination    GERD (gastroesophageal reflux disease)    History of colon polyps    History of DVT of lower extremity    POST LEFT TOTAL KNEE  1996   History of hiatal hernia    History of iron deficiency anemia 1996   iron infusion   History of migraine headaches    History of radiation therapy    left breast 03/27/2021-04/23/2021 Dr Lynwood Nasuti   History of rib fracture    Hyperlipidemia    Hypertension    IBS (irritable bowel syndrome)    Insomnia    Joint pain    MCI (mild cognitive impairment)    Melanoma (HCC) 2019   left leg    Migraine headache  hx of migraines    OA (osteoarthritis)    RIGHT SHOULDER   Osteopenia    Personal history of radiation therapy    Pneumonia    remote history   PONV (postoperative nausea and vomiting)    Recovering alcoholic (HCC)    SINCE 01-21-1980   Right rotator cuff tear    SOB (shortness of breath) on exertion    Substance abuse (HCC)    recovering alcoholic   Swallowing difficulty    Unspecified essential hypertension    Past Surgical History:  Procedure Laterality Date   ATRIAL FIBRILLATION ABLATION N/A 10/13/2022   Procedure: ATRIAL FIBRILLATION ABLATION;  Surgeon: Cindie Ole DASEN, MD;  Location: [facility];  Service: Cardiovascular;  Laterality: N/A;   BREAST BIOPSY Left 01/06/2021   pos   BREAST LUMPECTOMY     BREAST  LUMPECTOMY WITH RADIOACTIVE SEED LOCALIZATION Left 02/27/2021   Procedure: LEFT BREAST LUMPECTOMY WITH RADIOACTIVE SEED LOCALIZATION;  Surgeon: Aron Shoulders, MD;  Location: [facility];  Service: General;  Laterality: Left;   BUNIONECTOMY/  HAMMERTOE CORRECTION  RIGHT FOOT  2011   CATARACT EXTRACTION W/ INTRAOCULAR LENS  IMPLANT, BILATERAL     COLONOSCOPY     DILATION AND CURETTAGE OF UTERUS  1976   EYE SURGERY Bilateral 2016   cataract removal   KNEE ARTHROSCOPY W/ MENISCECTOMY Bilateral X2  LEFT /    X1  RIGHT   KNEE OPEN LATERAL RELEASE Bilateral    MOHS SURGERY Left 12/15/2017   Melanoma in situ - left calf - [facility]   ORIF ANKLE FRACTURE Right 08/04/2020   Procedure: OPEN REDUCTION INTERNAL FIXATION (ORIF) ANKLE FRACTURE;  Surgeon: Kit Rush, MD;  Location: [facility];  Service: Orthopedics;  Laterality: Right;  Mini C-arm, [employer] Small Frag   REPLACEMENT TOTAL KNEE Left 2006   SHOULDER ARTHROSCOPY WITH SUBACROMIAL DECOMPRESSION, ROTATOR CUFF REPAIR AND BICEP TENDON REPAIR Right 05/23/2013   Procedure: RIGHT SHOULDER ARTHROSCOPY EXAM UNDER ANESTHESIA  WITH SUBACROMIAL DECOMPRESSION,DISTAL CLAVICLE RESECTION, SADLABRAL DEBRIDEMENT CHONDROPLASTY, BICEP TENOTOMY ;  Surgeon: Lamar Collet, MD;  Location: [facility];  Service: Orthopedics;  Laterality: Right;   TONSILLECTOMY AND ADENOIDECTOMY  AGE 38   TOTAL HIP ARTHROPLASTY Left 05/04/2017   Procedure: LEFT TOTAL HIP ARTHROPLASTY ANTERIOR APPROACH;  Surgeon: Ernie Cough, MD;  Location: [facility];  Service: Orthopedics;  Laterality: Left;   TOTAL HIP ARTHROPLASTY Right 08/01/2019   Procedure: TOTAL HIP ARTHROPLASTY ANTERIOR APPROACH;  Surgeon: Ernie Cough, MD;  Location: [facility];  Service: Orthopedics;  Laterality: Right;    TOTAL KNEE ARTHROPLASTY Bilateral LEFT  1996/   RIGHT 2004   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      UPPER GASTROINTESTINAL ENDOSCOPY     UPPER GI ENDOSCOPY     VAGINAL HYSTERECTOMY   1976   Family History  Problem Relation Age of Onset   Cancer Mother    Heart disease Mother    Hypertension Mother    Melanoma Mother 64   Obesity Mother    Heart disease Father 38   Hypertension Father    Esophageal cancer Brother    Dementia Paternal Grandfather    Melanoma Niece 23   Colon cancer Neg Hx    Colon polyps Neg Hx    Stomach cancer Neg Hx    Rectal cancer Neg Hx    Social History   Occupational History   Occupation: retired paramedic  Tobacco Use   Smoking status: Former  Current packs/day: 0.00    Average packs/day: 0.5 packs/day for 15.0 years (7.5 ttl pk-yrs)    Types: Cigarettes    Start date: 05/16/1970    Quit date: 05/15/1985    Years since quitting: 38.8   Smokeless tobacco: Never   Tobacco comments:    Former smoker 12/10/22  Vaping Use   Vaping status: Never Used  Substance and Sexual Activity   Alcohol use: No    Comment: RECOVERING ALCOHOLIC--  QUIT [phone]   Drug use: No   Sexual activity: Not Currently    Birth control/protection: Post-menopausal   Tobacco Counseling Counseling given: No Tobacco comments: Former smoker 12/10/22  SDOH Screenings   Food Insecurity: No Food Insecurity (03/10/2024)  Housing: Low Risk (03/10/2024)  Recent Concern: Housing - High Risk (01/11/2024)  Transportation Needs: No Transportation Needs (03/10/2024)  Utilities: Not At Risk (03/10/2024)  Alcohol Screen: Low Risk (04/18/2021)  Depression (PHQ2-9): Low Risk (03/10/2024)  Financial Resource Strain: Low Risk (04/18/2021)  Physical Activity: Inactive (03/10/2024)  Social Connections: Moderately Integrated (03/10/2024)  Stress: No Stress Concern Present (03/10/2024)  Tobacco Use: Medium Risk (03/10/2024)  Health Literacy: Adequate Health Literacy (03/10/2024)   See flowsheets for full screening details  Depression Screen PHQ 2 & 9 Depression Scale- Over the past 2 weeks, how often have you been bothered by any of the following problems? Little interest  or pleasure in doing things: 0 Feeling down, depressed, or hopeless (PHQ Adolescent also includes...irritable): 0 PHQ-2 Total Score: 0 Trouble falling or staying asleep, or sleeping too much: 2 Feeling tired or having little energy: 2 Poor appetite or overeating (PHQ Adolescent also includes...weight loss): 2 Feeling bad about yourself - or that you are a failure or have let yourself or your family down: 0 Trouble concentrating on things, such as reading the newspaper or watching television (PHQ Adolescent also includes...like school work): 0 Moving or speaking so slowly that other people could have noticed. Or the opposite - being so fidgety or restless that you have been moving around a lot more than usual: 0 Thoughts that you would be better off dead, or of hurting yourself in some way: 0 PHQ-9 Total Score: 10 If you checked off any problems, how difficult have these problems made it for you to do your work, take care of things at home, or get along with other people?: Somewhat difficult  Depression Treatment Depression Interventions/Treatment : Counseling     Goals Addressed               This Visit's Progress     Continue to lose weight (pt-stated)        Lose weight !             Objective:    Today's Vitals   03/10/24 0808  Weight: 196 lb (88.9 kg)  Height: 5' 5 (1.651 m)   Body mass index is 32.62 kg/m.  Hearing/Vision screen Hearing Screening - Comments:: Wears Hearing Aids Vision Screening - Comments:: Wears rx glasses - up to date with routine eye exams with  [facility] Immunizations and Health Maintenance Health Maintenance  Topic Date Due   Influenza Vaccine  09/17/2023   COVID-19 Vaccine (6 - 2025-26 season) 10/18/2023   Mammogram  02/08/2024   Colonoscopy  11/18/2024   Medicare Annual Wellness (AWV)  03/10/2025   DTaP/Tdap/Td (3 - Td or Tdap) 08/01/2030   Pneumococcal Vaccine: 50+ Years  Completed   Bone Density Scan  Completed   Hepatitis  C Screening  Completed   Zoster Vaccines- Shingrix  Completed   Meningococcal B Vaccine  Aged Out        Assessment/Plan:  This is a routine wellness examination for Bertha.  Patient Care Team: Merna Huxley, NP as PCP - General (Family Medicine) Court Dorn PARAS, MD as PCP - Cardiology (Cardiology) Cindie Ole DASEN, MD (Inactive) as PCP - Electrophysiology (Cardiology) Ottelin, Mark, MD (Inactive) as Consulting Physician (Urology) Orth[facility], [facility] (Specialist) Liane Sharyne MATSU, [facility] (Inactive) as Pharmacist (Pharmacist) Tyree Nanetta SAILOR, RN as Oncology Nurse Navigator Aron Shoulders, MD as Consulting Physician (General Surgery) Odean Potts, MD as Consulting Physician (Hematology and Oncology) Shannon Agent, MD as Consulting Physician (Radiation Oncology)  I have personally reviewed and noted the following in the patients chart:   Medical and social history Use of alcohol, tobacco or illicit drugs  Current medications and supplements including opioid prescriptions. Functional ability and status Nutritional status Physical activity Advanced directives List of other physicians Hospitalizations, surgeries, and ER visits in previous 12 months Vitals Screenings to include cognitive, depression, and falls Referrals and appointments  Orders Placed This Encounter  Procedures   MM 3D SCREENING MAMMOGRAM BILATERAL BREAST    Standing Status:   Future    Expiration Date:   03/10/2025    Reason for Exam (SYMPTOM  OR DIAGNOSIS REQUIRED):   Breast cancer screenng    Preferred imaging [facility]?:   [facility]   In addition, I have reviewed and discussed with patient certain preventive protocols, quality metrics, and best practice recommendations. A written personalized care plan for preventive services as well as general preventive health recommendations were provided to patient.   Rojelio LELON Blush, LPN   [id number]/76/7973   Return in 53 weeks (on 03/16/2025).  After Visit Summary:  (MyChart) Due to this being a telephonic visit, the after visit summary with patients personalized plan was offered to patient via MyChart   Nurse Notes: HM Addressed: Mammogram ordered "

## 2024-03-10 NOTE — Addendum Note (Signed)
 Addended by: VICCI LEADER R on: 03/10/2024 04:15 PM   Modules accepted: Orders

## 2024-03-10 NOTE — Telephone Encounter (Signed)
 Copied from CRM #[id number]. Topic: Clinical - Medication Refill >> Mar 10, 2024 11:26 AM Avram MATSU wrote: Medication: meclizine  (ANTIVERT ) 12.5 MG tablet [[id number]]  Has the patient contacted their pharmacy? Yes (Agent: If no, request that the patient contact the pharmacy for the refill. If patient does not wish to contact the pharmacy document the reason why and proceed with request.) (Agent: If yes, when and what did the pharmacy advise?)  This is the patient's preferred pharmacy:  [id number] [address], [place] - [address] [place] [address] Phone: [phone] Fax: [phone]  Is this the correct pharmacy for this prescription? Yes If no, delete pharmacy and type the correct one.   Has the prescription been filled recently? No  Is the patient out of the medication? Yes  Has the patient been seen for an appointment in the last year OR does the patient have an upcoming appointment? No  Can we respond through MyChart? Yes  Agent: Please be advised that Rx refills may take up to 3 business days. We ask that you follow-up with your pharmacy.

## 2024-03-10 NOTE — Telephone Encounter (Signed)
 Okay for refill?

## 2024-03-16 ENCOUNTER — Ambulatory Visit: Admitting: Family Medicine

## 2024-03-17 ENCOUNTER — Other Ambulatory Visit (HOSPITAL_COMMUNITY): Payer: Self-pay | Admitting: Psychiatry

## 2024-03-22 ENCOUNTER — Ambulatory Visit
Admission: RE | Admit: 2024-03-22 | Discharge: 2024-03-22 | Disposition: A | Source: Ambulatory Visit | Attending: Adult Health

## 2024-03-22 DIAGNOSIS — Z1231 Encounter for screening mammogram for malignant neoplasm of breast: Secondary | ICD-10-CM

## 2024-03-23 ENCOUNTER — Other Ambulatory Visit: Payer: Self-pay | Admitting: Adult Health

## 2024-03-23 ENCOUNTER — Other Ambulatory Visit (HOSPITAL_COMMUNITY): Payer: Self-pay | Admitting: Psychiatry

## 2024-03-23 DIAGNOSIS — Z9889 Other specified postprocedural states: Secondary | ICD-10-CM

## 2024-03-27 ENCOUNTER — Telehealth (HOSPITAL_COMMUNITY): Admitting: Psychiatry

## 2024-03-31 ENCOUNTER — Encounter
